# Patient Record
Sex: Male | Born: 1960 | Race: Black or African American | Hispanic: No | Marital: Single | Smoking: Former smoker
Health system: Southern US, Community
[De-identification: ages and names within clinical notes are randomized; demographics above are authoritative.]

## PROBLEM LIST (undated history)

## (undated) DIAGNOSIS — E782 Mixed hyperlipidemia: Secondary | ICD-10-CM

## (undated) DIAGNOSIS — B1920 Unspecified viral hepatitis C without hepatic coma: Secondary | ICD-10-CM

## (undated) DIAGNOSIS — G4730 Sleep apnea, unspecified: Secondary | ICD-10-CM

## (undated) DIAGNOSIS — F1010 Alcohol abuse, uncomplicated: Secondary | ICD-10-CM

## (undated) DIAGNOSIS — N183 Chronic kidney disease, stage 3 unspecified: Secondary | ICD-10-CM

## (undated) DIAGNOSIS — J40 Bronchitis, not specified as acute or chronic: Secondary | ICD-10-CM

## (undated) DIAGNOSIS — M25559 Pain in unspecified hip: Secondary | ICD-10-CM

## (undated) DIAGNOSIS — I10 Essential (primary) hypertension: Secondary | ICD-10-CM

## (undated) DIAGNOSIS — J449 Chronic obstructive pulmonary disease, unspecified: Secondary | ICD-10-CM

## (undated) DIAGNOSIS — M1990 Unspecified osteoarthritis, unspecified site: Secondary | ICD-10-CM

## (undated) DIAGNOSIS — F419 Anxiety disorder, unspecified: Secondary | ICD-10-CM

## (undated) DIAGNOSIS — N289 Disorder of kidney and ureter, unspecified: Secondary | ICD-10-CM

## (undated) DIAGNOSIS — G8929 Other chronic pain: Secondary | ICD-10-CM

## (undated) HISTORY — DX: Chronic kidney disease, stage 3 unspecified: N18.30

## (undated) HISTORY — PX: CATARACT EXTRACTION: SUR2

## (undated) HISTORY — PX: MOUTH SURGERY: SHX715

## (undated) HISTORY — DX: Unspecified viral hepatitis C without hepatic coma: B19.20

## (undated) HISTORY — DX: Essential (primary) hypertension: I10

## (undated) HISTORY — DX: Alcohol abuse, uncomplicated: F10.10

## (undated) HISTORY — DX: Mixed hyperlipidemia: E78.2

---

## 2013-07-06 ENCOUNTER — Emergency Department (HOSPITAL_COMMUNITY)
Admission: EM | Admit: 2013-07-06 | Discharge: 2013-07-06 | Disposition: A | Discharge status: Home / Self Care | Payer: BC Managed Care – PPO | Attending: Emergency Medicine | Admitting: Emergency Medicine

## 2013-07-06 ENCOUNTER — Other Ambulatory Visit: Payer: Self-pay

## 2013-07-06 ENCOUNTER — Encounter (HOSPITAL_COMMUNITY): Payer: Self-pay | Admitting: Emergency Medicine

## 2013-07-06 DIAGNOSIS — F17200 Nicotine dependence, unspecified, uncomplicated: Secondary | ICD-10-CM | POA: Insufficient documentation

## 2013-07-06 DIAGNOSIS — R300 Dysuria: Secondary | ICD-10-CM | POA: Insufficient documentation

## 2013-07-06 DIAGNOSIS — J029 Acute pharyngitis, unspecified: Secondary | ICD-10-CM | POA: Insufficient documentation

## 2013-07-06 DIAGNOSIS — R110 Nausea: Secondary | ICD-10-CM | POA: Insufficient documentation

## 2013-07-06 DIAGNOSIS — R42 Dizziness and giddiness: Secondary | ICD-10-CM | POA: Insufficient documentation

## 2013-07-06 DIAGNOSIS — Z8659 Personal history of other mental and behavioral disorders: Secondary | ICD-10-CM | POA: Insufficient documentation

## 2013-07-06 DIAGNOSIS — Z9119 Patient's noncompliance with other medical treatment and regimen: Secondary | ICD-10-CM | POA: Insufficient documentation

## 2013-07-06 DIAGNOSIS — I10 Essential (primary) hypertension: Secondary | ICD-10-CM

## 2013-07-06 DIAGNOSIS — R062 Wheezing: Secondary | ICD-10-CM | POA: Insufficient documentation

## 2013-07-06 DIAGNOSIS — Z91199 Patient's noncompliance with other medical treatment and regimen due to unspecified reason: Secondary | ICD-10-CM | POA: Insufficient documentation

## 2013-07-06 HISTORY — DX: Bronchitis, not specified as acute or chronic: J40

## 2013-07-06 HISTORY — DX: Essential (primary) hypertension: I10

## 2013-07-06 HISTORY — DX: Anxiety disorder, unspecified: F41.9

## 2013-07-06 LAB — CBC WITH DIFFERENTIAL/PLATELET
Basophils Relative: 0 % (ref 0–1)
Eosinophils Absolute: 0.4 10*3/uL (ref 0.0–0.7)
Eosinophils Relative: 7 % — ABNORMAL HIGH (ref 0–5)
MCH: 32.6 pg (ref 26.0–34.0)
MCHC: 35 g/dL (ref 30.0–36.0)
Neutrophils Relative %: 64 % (ref 43–77)
Platelets: 170 10*3/uL (ref 150–400)
RBC: 4.7 MIL/uL (ref 4.22–5.81)
RDW: 13.2 % (ref 11.5–15.5)
WBC: 6.4 10*3/uL (ref 4.0–10.5)

## 2013-07-06 LAB — URINALYSIS, ROUTINE W REFLEX MICROSCOPIC
Bilirubin Urine: NEGATIVE
Glucose, UA: NEGATIVE mg/dL
Ketones, ur: NEGATIVE mg/dL
Leukocytes, UA: NEGATIVE
Nitrite: NEGATIVE
Specific Gravity, Urine: >1.03 — ABNORMAL HIGH (ref 1.005–1.030)
pH: 5.5 (ref 5.0–8.0)

## 2013-07-06 LAB — COMPREHENSIVE METABOLIC PANEL
ALT: 40 U/L (ref 0–53)
AST: 47 U/L — ABNORMAL HIGH (ref 0–37)
Albumin: 3.5 g/dL (ref 3.5–5.2)
Alkaline Phosphatase: 106 U/L (ref 39–117)
Calcium: 9.9 mg/dL (ref 8.4–10.5)
Potassium: 3.3 mEq/L — ABNORMAL LOW (ref 3.5–5.1)
Sodium: 134 mEq/L — ABNORMAL LOW (ref 135–145)
Total Protein: 8.6 g/dL — ABNORMAL HIGH (ref 6.0–8.3)

## 2013-07-06 MED ORDER — CLONIDINE HCL 0.2 MG PO TABS
ORAL_TABLET | ORAL | Status: AC
  Administered 2013-07-06: 0.2 mg via ORAL
  Filled 2013-07-06: qty 1

## 2013-07-06 MED ORDER — LORAZEPAM 1 MG PO TABS
1.0000 mg | ORAL_TABLET | Freq: Once | ORAL | Status: AC
  Administered 2013-07-06: 1 mg via ORAL
  Filled 2013-07-06: qty 1

## 2013-07-06 MED ORDER — CLONIDINE HCL 0.2 MG PO TABS: 0.1000 mg | ORAL_TABLET | Freq: Two times a day (BID) | ORAL | Status: DC

## 2013-07-06 MED ORDER — CLONIDINE HCL 0.2 MG PO TABS: 0.2000 mg | ORAL_TABLET | Freq: Once | ORAL | Status: DC

## 2013-07-06 MED ORDER — CLONIDINE HCL 0.2 MG PO TABS
0.2000 mg | ORAL_TABLET | Freq: Once | ORAL | Status: AC
  Administered 2013-07-06: 0.2 mg via ORAL

## 2013-07-06 NOTE — ED Notes (Signed)
Sister states pts blood pressure was 214/138 last night and this morning it was 143/134. Pt states headaches and dizziness at times. States only feeling light headed at this time. States has had heart palpitations x 2 days. NAD at this time

## 2013-07-06 NOTE — ED Provider Notes (Signed)
CSN: [id number]     Arrival date & time 07/06/13  0906 History   First MD Initiated Contact with Patient 07/06/13 [phone]     Chief Complaint  Patient presents with  . Hypertension   (Consider location/radiation/quality/duration/timing/severity/associated sxs/prior Treatment) Patient is a 52 y.o. male presenting with hypertension. The history is provided by the patient and a relative.  Hypertension This is a chronic problem. Associated symptoms include nausea and a sore throat. Pertinent negatives include no abdominal pain, chest pain, chills, congestion, fever, headaches, numbness, rash, vomiting or weakness.   Mathew Lara is a 53 y.o. male who presents to the ED with hypertension. Family states that about a year ago his wife left him and he stopped all his BP medications and started drinking alcohol. Three days ago he started feeling dizzy and had a headache. Patient went to [facility] in [place] and they told him he had heart and kidney problems. They started him HCTZ 25 mg PO daily. His family went and picked him up last night and brought him to stay with them in [place]. They checked his blood pressure last night and it was 214/138 and this mornin 183/134.  On arrival to the ED BP 181/123. Patient denies chest pain, nausea, vomiting, dizziness or headache at this time. Last drank alcohol 3 days ago. Prior to that had been drinking daily.   Past Medical History  Diagnosis Date  . Hypertension   . Bronchitis   . Cardiomyopathy   . Anxiety    History reviewed. No pertinent past surgical history. No family history on file. History  Substance Use Topics  . Smoking status: Current Every Day Smoker    Types: Cigarettes  . Smokeless tobacco: Not on file  . Alcohol Use: Yes     Comment: daily, beer and liquor    Review of Systems  Constitutional: Negative for fever and chills. Appetite change: decreased.  HENT: Positive for sore throat. Negative for congestion.   Eyes:  Negative for visual disturbance.  Respiratory: Positive for wheezing. Negative for shortness of breath.   Cardiovascular: Negative for chest pain, palpitations and leg swelling.  Gastrointestinal: Positive for nausea. Negative for vomiting and abdominal pain.  Genitourinary: Positive for difficulty urinating. Negative for dysuria, urgency and frequency.  Musculoskeletal: Negative for gait problem.  Skin: Negative for rash.  Allergic/Immunologic: Negative for environmental allergies and food allergies.  Neurological: Negative for dizziness, syncope, weakness, numbness and headaches.  Psychiatric/Behavioral: Negative for confusion. The patient is not nervous/anxious.     Allergies  Review of patient's allergies indicates no known allergies.  BP 182/119  Pulse 84  Temp(Src) 98 F (36.7 C) (Oral)  Resp 18  SpO2 95%  Home Medications  Physical Exam  Nursing note and vitals reviewed. Constitutional: He is oriented to person, place, and time. He appears well-developed and well-nourished. No distress.  HENT:  Head: Normocephalic and atraumatic.  Right Ear: External ear normal.  Left Ear: External ear normal.  Mouth/Throat: Uvula is midline, oropharynx is clear and moist and mucous membranes are normal.  Eyes: Conjunctivae and EOM are normal. Pupils are equal, round, and reactive to light.  Neck: Trachea normal and normal range of motion. Neck supple. Normal carotid pulses and no JVD present. Carotid bruit is not present.  Cardiovascular: Normal rate and regular rhythm.   Pulmonary/Chest: Effort normal and breath sounds normal.  Abdominal: Soft. Bowel sounds are normal. There is no tenderness.  Musculoskeletal: Normal range of motion. He exhibits no edema and no  tenderness.  Neurological: He is alert and oriented to person, place, and time. He has normal strength and normal reflexes. No cranial nerve deficit or sensory deficit. He displays a negative Romberg sign. Coordination and gait  normal.  Skin: Skin is warm and dry.  Psychiatric: He has a normal mood and affect. His behavior is normal.   Results for orders placed during the hospital encounter of 07/06/13 (from the past 24 hour(s))  URINALYSIS, ROUTINE W REFLEX MICROSCOPIC     Status: Abnormal   Collection Time    07/06/13 10:24 AM      Result Value Range   Color, Urine YELLOW  YELLOW   APPearance CLEAR  CLEAR   Specific Gravity, Urine >1.030 (*) 1.005 - 1.030   pH 5.5  5.0 - 8.0   Glucose, UA NEGATIVE  NEGATIVE mg/dL   Hgb urine dipstick NEGATIVE  NEGATIVE   Bilirubin Urine NEGATIVE  NEGATIVE   Ketones, ur NEGATIVE  NEGATIVE mg/dL   Protein, ur 100 (*) NEGATIVE mg/dL   Urobilinogen, UA 0.2  0.0 - 1.0 mg/dL   Nitrite NEGATIVE  NEGATIVE   Leukocytes, UA NEGATIVE  NEGATIVE  URINE MICROSCOPIC-ADD ON     Status: None   Collection Time    07/06/13 10:24 AM      Result Value Range   RBC / HPF 0-2  <3 RBC/hpf  CBC WITH DIFFERENTIAL     Status: Abnormal   Collection Time    07/06/13 10:47 AM      Result Value Range   WBC 6.4  4.0 - 10.5 K/uL   RBC 4.70  4.22 - 5.81 MIL/uL   Hemoglobin 15.3  13.0 - 17.0 g/dL   HCT 43.7  39.0 - 52.0 %   MCV 93.0  78.0 - 100.0 fL   MCH 32.6  26.0 - 34.0 pg   MCHC 35.0  30.0 - 36.0 g/dL   RDW 13.2  11.5 - 15.5 %   Platelets 170  150 - 400 K/uL   Neutrophils Relative % 64  43 - 77 %   Neutro Abs 4.1  1.7 - 7.7 K/uL   Lymphocytes Relative 22  12 - 46 %   Lymphs Abs 1.4  0.7 - 4.0 K/uL   Monocytes Relative 7  3 - 12 %   Monocytes Absolute 0.4  0.1 - 1.0 K/uL   Eosinophils Relative 7 (*) 0 - 5 %   Eosinophils Absolute 0.4  0.0 - 0.7 K/uL   Basophils Relative 0  0 - 1 %   Basophils Absolute 0.0  0.0 - 0.1 K/uL  COMPREHENSIVE METABOLIC PANEL     Status: Abnormal   Collection Time    07/06/13 10:47 AM      Result Value Range   Sodium 134 (*) 135 - 145 mEq/L   Potassium 3.3 (*) 3.5 - 5.1 mEq/L   Chloride 95 (*) 96 - 112 mEq/L   CO2 27  19 - 32 mEq/L   Glucose, Bld 94  70 -  99 mg/dL   BUN 21  6 - 23 mg/dL   Creatinine, Ser 1.54 (*) 0.50 - 1.35 mg/dL   Calcium 9.9  8.4 - 10.5 mg/dL   Total Protein 8.6 (*) 6.0 - 8.3 g/dL   Albumin 3.5  3.5 - 5.2 g/dL   AST 47 (*) 0 - 37 U/L   ALT 40  0 - 53 U/L   Alkaline Phosphatase 106  39 - 117 U/L   Total Bilirubin 1.1  0.3 - 1.2 mg/dL   GFR calc non Af Amer 50 (*) >90 mL/min   GFR calc Af Amer 58 (*) >90 mL/min    ED Course: Dr. Jeanell Sparrow reviewed EKG from today and compared to EKG done in [place].   Procedures  Dr. Jeanell Sparrow in to evaluate the patient and feels he is stable for discharge home with Clonidine and follow up as scheduled with PCP.  MDM  Patient's daughter here and request patient be admitted for observation. Will request records from ED visit in [place] and compare EKG and make decision at that time.  Dr. Jeanell Sparrow in to discuss plan of care with the family in detail. Patient will go home and follow up with PCP as planned. Family with bring him back for any problems.   52 y.o. male with hypertension. He has stopped drinking alcohol 3 days ago. He will start Clonidine and follow up with PCP. Discussed with the patient and all questioned fully answered.    Medication List    TAKE these medications       cloNIDine 0.2 MG tablet  Commonly known as:  CATAPRES  Take 0.5 tablets (0.1 mg total) by mouth 2 (two) times daily.      ASK your doctor about these medications       hydrochlorothiazide 12.5 MG capsule  Commonly known as:  MICROZIDE  Take 25 mg by mouth daily.          Ashley Murrain, [place] 07/06/13 [phone]

## 2013-07-06 NOTE — ED Notes (Signed)
Pt has been out of medications x 1 1/2 years. He was recently hospitalized and was put on hydrochlorothiazide.

## 2013-07-06 NOTE — ED Provider Notes (Signed)
  I performed a history and physical examination of Mathew Lara and discussed his management with Mathew Lara.  I agree with the history, physical, assessment, and plan of care, with the following exceptions: None  I was present for the following procedures: None Time Spent in Critical Care of the patient: None Time spent in discussions with the patient and family: [age] y.o. Male with hypertension and general malaise.  No acute sequelae noted. Patient with ongoing renal insufficiency, and lvh without change from prior ekg, no neuro changes.  Patient started on clonidine in additon to hctz recently restarted.  He has been off his meds for 1.5 years secondary to noncompliance.  I have discussed his care extensively with patient, daughter and other family members.  Mathew Evener, MD 07/06/13 [phone]

## 2013-07-06 NOTE — ED Notes (Signed)
Went in to d/c pt. Pt family requesting to speak with PA. PA at bedside. PA reported would speak with EDP and get EDP to speak with pt.

## 2013-07-06 NOTE — ED Notes (Signed)
Patient would like something to eat and drink. PA made aware.Patint made aware that PA would like him to wait a little bit longer.

## 2013-07-06 NOTE — ED Notes (Signed)
Family member states she does not feel comfortable taking pt home due to his blood pressure being elevated. Dr Jeanell Sparrow in to [facility]

## 2013-07-06 NOTE — ED Notes (Signed)
Pt states his wife left him over a year ago. States he lost his job and has been drink a lot. States his bp is up because he is stressed.

## 2013-08-09 ENCOUNTER — Other Ambulatory Visit (HOSPITAL_COMMUNITY): Payer: Self-pay | Admitting: Family Medicine

## 2013-08-09 DIAGNOSIS — I10 Essential (primary) hypertension: Secondary | ICD-10-CM

## 2013-08-14 ENCOUNTER — Ambulatory Visit (HOSPITAL_COMMUNITY)
Admission: RE | Admit: 2013-08-14 | Discharge: 2013-08-14 | Disposition: A | Discharge status: Home / Self Care | Payer: BC Managed Care – PPO | Source: Ambulatory Visit | Attending: Family Medicine | Admitting: Family Medicine

## 2013-08-14 DIAGNOSIS — I10 Essential (primary) hypertension: Secondary | ICD-10-CM

## 2013-08-28 ENCOUNTER — Other Ambulatory Visit (HOSPITAL_COMMUNITY): Payer: Self-pay | Admitting: Family Medicine

## 2013-08-28 DIAGNOSIS — I701 Atherosclerosis of renal artery: Secondary | ICD-10-CM

## 2013-08-28 DIAGNOSIS — I10 Essential (primary) hypertension: Secondary | ICD-10-CM

## 2013-09-10 ENCOUNTER — Other Ambulatory Visit (HOSPITAL_COMMUNITY): Payer: Self-pay

## 2013-09-10 ENCOUNTER — Ambulatory Visit (HOSPITAL_COMMUNITY)
Admission: RE | Admit: 2013-09-10 | Discharge: 2013-09-10 | Disposition: A | Discharge status: Home / Self Care | Payer: BC Managed Care – PPO | Source: Ambulatory Visit | Attending: Emergency Medicine | Admitting: Emergency Medicine

## 2013-09-10 DIAGNOSIS — I701 Atherosclerosis of renal artery: Secondary | ICD-10-CM

## 2013-09-10 DIAGNOSIS — I10 Essential (primary) hypertension: Secondary | ICD-10-CM

## 2013-09-10 NOTE — Progress Notes (Signed)
*  PRELIMINARY RESULTS* Vascular Ultrasound Renal Artery Duplex has been completed.  Preliminary findings:  No Evidence of Renal Artery stenosis bilaterally. Symmetrical kidney size. Normal intrarenal indices.   Landry Mellow, RDMS, RVT  09/10/2013, 9:34 AM

## 2013-09-24 ENCOUNTER — Emergency Department (HOSPITAL_COMMUNITY): Admission: EM | Admit: 2013-09-24 | Discharge: 2013-09-24 | Discharge status: Against Medical Advice | Payer: Self-pay

## 2013-09-24 ENCOUNTER — Emergency Department (HOSPITAL_COMMUNITY)
Admission: EM | Admit: 2013-09-24 | Discharge: 2013-09-24 | Disposition: A | Discharge status: Home / Self Care | Payer: BC Managed Care – PPO | Attending: Emergency Medicine | Admitting: Emergency Medicine

## 2013-09-24 ENCOUNTER — Encounter (HOSPITAL_COMMUNITY): Payer: Self-pay | Admitting: Emergency Medicine

## 2013-09-24 ENCOUNTER — Emergency Department (HOSPITAL_COMMUNITY): Payer: BC Managed Care – PPO

## 2013-09-24 DIAGNOSIS — F17200 Nicotine dependence, unspecified, uncomplicated: Secondary | ICD-10-CM | POA: Insufficient documentation

## 2013-09-24 DIAGNOSIS — Z79899 Other long term (current) drug therapy: Secondary | ICD-10-CM | POA: Insufficient documentation

## 2013-09-24 DIAGNOSIS — F411 Generalized anxiety disorder: Secondary | ICD-10-CM | POA: Insufficient documentation

## 2013-09-24 DIAGNOSIS — I10 Essential (primary) hypertension: Secondary | ICD-10-CM | POA: Insufficient documentation

## 2013-09-24 DIAGNOSIS — Z8709 Personal history of other diseases of the respiratory system: Secondary | ICD-10-CM | POA: Insufficient documentation

## 2013-09-24 DIAGNOSIS — I959 Hypotension, unspecified: Secondary | ICD-10-CM | POA: Insufficient documentation

## 2013-09-24 DIAGNOSIS — Z7982 Long term (current) use of aspirin: Secondary | ICD-10-CM | POA: Insufficient documentation

## 2013-09-24 LAB — CBC WITH DIFFERENTIAL/PLATELET
Basophils Absolute: 0 10*3/uL (ref 0.0–0.1)
Basophils Relative: 0 % (ref 0–1)
EOS ABS: 0.2 10*3/uL (ref 0.0–0.7)
Eosinophils Relative: 3 % (ref 0–5)
HEMATOCRIT: 44.3 % (ref 39.0–52.0)
HEMOGLOBIN: 14.9 g/dL (ref 13.0–17.0)
Lymphocytes Relative: 20 % (ref 12–46)
Lymphs Abs: 1.5 10*3/uL (ref 0.7–4.0)
MCH: 31 pg (ref 26.0–34.0)
MCHC: 33.6 g/dL (ref 30.0–36.0)
MCV: 92.3 fL (ref 78.0–100.0)
MONO ABS: 0.6 10*3/uL (ref 0.1–1.0)
Monocytes Relative: 7 % (ref 3–12)
NEUTROS ABS: 5.4 10*3/uL (ref 1.7–7.7)
Neutrophils Relative %: 70 % (ref 43–77)
Platelets: 217 10*3/uL (ref 150–400)
RBC: 4.8 MIL/uL (ref 4.22–5.81)
RDW: 13.1 % (ref 11.5–15.5)
WBC: 7.8 10*3/uL (ref 4.0–10.5)

## 2013-09-24 LAB — BASIC METABOLIC PANEL
BUN: 27 mg/dL — AB (ref 6–23)
CHLORIDE: 99 meq/L (ref 96–112)
CO2: 27 mEq/L (ref 19–32)
CREATININE: 2.27 mg/dL — AB (ref 0.50–1.35)
Calcium: 9.9 mg/dL (ref 8.4–10.5)
GFR calc non Af Amer: 31 mL/min — ABNORMAL LOW (ref >90)
GFR, EST AFRICAN AMERICAN: 36 mL/min — AB (ref >90)
Glucose, Bld: 117 mg/dL — ABNORMAL HIGH (ref 70–99)
Potassium: 4.6 mEq/L (ref 3.7–5.3)
Sodium: 139 mEq/L (ref 137–147)

## 2013-09-24 LAB — TROPONIN I: Troponin I: <0.3 ng/mL (ref <0.30)

## 2013-09-24 LAB — LACTIC ACID, PLASMA: Lactic Acid, Venous: 1.2 mmol/L (ref 0.5–2.2)

## 2013-09-24 MED ORDER — SODIUM CHLORIDE 0.9 % IV BOLUS (SEPSIS)
1000.0000 mL | Freq: Once | INTRAVENOUS | Status: AC
  Administered 2013-09-24: 1000 mL via INTRAVENOUS

## 2013-09-24 NOTE — ED Provider Notes (Signed)
CSN: [id number]     Arrival date & time 09/24/13  1742 History   First MD Initiated Contact with Patient 09/24/13 1815 This chart was scribed for Mathew Diego, MD by Anastasia Pall, ED Scribe. This patient was seen in room APA17/APA17 and the patient's care was started at 6:18 PM.      Chief Complaint  Patient presents with  . Dizziness  . Hypertension    Patient is a 53 y.o. male presenting with dizziness and hypertension. The history is provided by the patient. No language interpreter was used.  Dizziness Quality:  Lightheadedness Duration:  5 hours (since 1:30pm today) Timing:  Constant Progression:  Unchanged Chronicity:  New Associated symptoms: shortness of breath and weakness   Associated symptoms: no chest pain, no diarrhea and no headaches   Hypertension Associated symptoms include shortness of breath. Pertinent negatives include no chest pain, no abdominal pain and no headaches.   HPI Comments: [name] is a 53 y.o. male with h/o HTN, who presents to the Emergency Department complaining of constant weakness, with associated intermittent dizziness and SOB, onset 1:30pm today. He states he thinks he may have taken an extra BP pill. He states his daughter usually gives him his medicine, but this time he tried to do it. He denies any pain, and any other associated symptoms.   PCP - Purvis Kilts, MD  Past Medical History  Diagnosis Date  . Hypertension   . Bronchitis   . Cardiomyopathy   . Anxiety    History reviewed. No pertinent past surgical history. No family history on file. History  Substance Use Topics  . Smoking status: Current Every Day Smoker    Types: Cigarettes  . Smokeless tobacco: Not on file  . Alcohol Use: Yes     Comment: daily, beer and liquor    Review of Systems  Constitutional: Negative for appetite change and fatigue.  HENT: Negative for congestion, ear discharge and sinus pressure.   Eyes: Negative for discharge.   Respiratory: Positive for shortness of breath. Negative for cough.   Cardiovascular: Negative for chest pain.  Gastrointestinal: Negative for abdominal pain and diarrhea.  Genitourinary: Negative for frequency and hematuria.  Musculoskeletal: Negative for back pain.  Skin: Negative for rash.  Neurological: Positive for dizziness and weakness. Negative for seizures and headaches.  Psychiatric/Behavioral: Negative for hallucinations.    Allergies  Review of patient's allergies indicates no known allergies.  Home Medications   Current Outpatient Rx  Name  Route  Sig  Dispense  Refill  . amLODipine (NORVASC) 5 MG tablet   Oral   Take 5 mg by mouth daily.         Marland Kitchen aspirin EC 81 MG tablet   Oral   Take 81 mg by mouth daily.         . cloNIDine (CATAPRES) 0.2 MG tablet   Oral   Take 0.5 tablets (0.1 mg total) by mouth 2 (two) times daily.   20 tablet   0   . losartan (COZAAR) 50 MG tablet   Oral   Take 50 mg by mouth daily.         . Multiple Vitamin (MULTIVITAMIN WITH MINERALS) TABS tablet   Oral   Take 1 tablet by mouth daily.         . pseudoephedrine-acetaminophen (TYLENOL SINUS) 30-500 MG TABS   Oral   Take 2 tablets by mouth every 4 (four) hours as needed (sinus).  BP 95/72  Pulse 86  Temp(Src) 97.6 F (36.4 C) (Oral)  Resp 20  Ht 5\' 8"  (1.727 m)  Wt 165 lb (74.844 kg)  BMI 25.09 kg/m2  SpO2 96%  Physical Exam  Constitutional: He is oriented to person, place, and time. He appears well-developed.  HENT:  Head: Normocephalic.  Eyes: Conjunctivae and EOM are normal. No scleral icterus.  Neck: Neck supple. No thyromegaly present.  Cardiovascular: Normal rate and regular rhythm.  Exam reveals no gallop and no friction rub.   No murmur heard. Pulmonary/Chest: No stridor. He has no wheezes. He has no rales. He exhibits no tenderness.  Abdominal: He exhibits no distension. There is no tenderness. There is no rebound.  Musculoskeletal: Normal  range of motion. He exhibits no edema.  Lymphadenopathy:    He has no cervical adenopathy.  Neurological: He is oriented to person, place, and time. He exhibits normal muscle tone. Coordination normal.  Skin: No rash noted. No erythema.  Psychiatric: He has a normal mood and affect. His behavior is normal.    ED Course  Procedures (including critical care time)  DIAGNOSTIC STUDIES: Oxygen Saturation is 96% on room air, normal by my interpretation.    COORDINATION OF CARE: 6:21 PM-Discussed treatment plan which includes IV fluids with pt at bedside and pt agreed to plan.   Results for orders placed during the hospital encounter of 09/24/13  CBC WITH DIFFERENTIAL      Result Value Range   WBC 7.8  4.0 - 10.5 K/uL   RBC 4.80  4.22 - 5.81 MIL/uL   Hemoglobin 14.9  13.0 - 17.0 g/dL   HCT 44.3  39.0 - 52.0 %   MCV 92.3  78.0 - 100.0 fL   MCH 31.0  26.0 - 34.0 pg   MCHC 33.6  30.0 - 36.0 g/dL   RDW 13.1  11.5 - 15.5 %   Platelets 217  150 - 400 K/uL   Neutrophils Relative % 70  43 - 77 %   Neutro Abs 5.4  1.7 - 7.7 K/uL   Lymphocytes Relative 20  12 - 46 %   Lymphs Abs 1.5  0.7 - 4.0 K/uL   Monocytes Relative 7  3 - 12 %   Monocytes Absolute 0.6  0.1 - 1.0 K/uL   Eosinophils Relative 3  0 - 5 %   Eosinophils Absolute 0.2  0.0 - 0.7 K/uL   Basophils Relative 0  0 - 1 %   Basophils Absolute 0.0  0.0 - 0.1 K/uL  BASIC METABOLIC PANEL      Result Value Range   Sodium 139  137 - 147 mEq/L   Potassium 4.6  3.7 - 5.3 mEq/L   Chloride 99  96 - 112 mEq/L   CO2 27  19 - 32 mEq/L   Glucose, Bld 117 (*) 70 - 99 mg/dL   BUN 27 (*) 6 - 23 mg/dL   Creatinine, Ser 2.27 (*) 0.50 - 1.35 mg/dL   Calcium 9.9  8.4 - 10.5 mg/dL   GFR calc non Af Amer 31 (*) >90 mL/min   GFR calc Af Amer 36 (*) >90 mL/min  TROPONIN I      Result Value Range   Troponin I <0.30  <0.30 ng/mL  LACTIC ACID, PLASMA      Result Value Range   Lactic Acid, Venous 1.2  0.5 - 2.2 mmol/L   Dg Chest 2  View  09/24/2013   CLINICAL DATA:  Dizziness and hypertension.  History of hypertension and cardiomyopathy. Smoker.  EXAM: CHEST  2 VIEW  COMPARISON:  None.  FINDINGS: Mild hyperinflation. Midline trachea. Heart size upper normal, with tortuous descending thoracic aorta. No pleural effusion or pneumothorax. Lucency projecting about the right upper lobe laterally measures 2.4 cm and is favored to represent a dominant bulla or bleb.  IMPRESSION: No acute cardiopulmonary disease.  Subtle right upper lobe lucency, favored to represent a dominant bulla or bleb. Comparison with prior radiographs versus radiographic followup at 2-3 months suggested.   Electronically Signed   By: Abigail Miyamoto M.D.   On: 09/24/2013 18:31    EKG Interpretation   None      Medications  sodium chloride 0.9 % bolus 1,000 mL (1,000 mLs Intravenous New Bag/Given 09/24/13 1833)    Date: 09/24/2013  Rate: 78  Rhythm: normal sinus rhythm  QRS Axis: normal  Intervals: normal  ST/T Wave abnormalities: nonspecific ST changes  Conduction Disutrbances:none  Narrative Interpretation:   Old EKG Reviewed: changes noted   MDM  Pt had taken too much bp med today.  Pt responded to fluids.   No chest pain.  Inverted t waves new.  Will follow up with pcp and cards   Mathew Diego, MD 09/24/13 2017

## 2013-09-24 NOTE — Discharge Instructions (Signed)
Do not take any blood pressure medicine tonight.  Follow up with your md this week.  Follow up with [facility] cardiology in a week.

## 2013-09-24 NOTE — ED Notes (Signed)
Pt reports dizziness and lightheaded, sob at times, since 1:30pm today,  Thinks his blood pressure may be elevated. But has not checked it. Denies any nausea or vomiting, no fever or cough. No cp.

## 2013-10-17 ENCOUNTER — Emergency Department (HOSPITAL_COMMUNITY): Payer: Worker's Compensation

## 2013-10-17 ENCOUNTER — Encounter (HOSPITAL_COMMUNITY): Payer: Self-pay | Admitting: Emergency Medicine

## 2013-10-17 ENCOUNTER — Emergency Department (HOSPITAL_COMMUNITY)
Admission: EM | Admit: 2013-10-17 | Discharge: 2013-10-17 | Disposition: A | Discharge status: Home / Self Care | Payer: Worker's Compensation | Attending: Emergency Medicine | Admitting: Emergency Medicine

## 2013-10-17 DIAGNOSIS — Y990 Civilian activity done for income or pay: Secondary | ICD-10-CM | POA: Insufficient documentation

## 2013-10-17 DIAGNOSIS — Y9289 Other specified places as the place of occurrence of the external cause: Secondary | ICD-10-CM | POA: Diagnosis not present

## 2013-10-17 DIAGNOSIS — X500XXA Overexertion from strenuous movement or load, initial encounter: Secondary | ICD-10-CM | POA: Diagnosis not present

## 2013-10-17 DIAGNOSIS — Z791 Long term (current) use of non-steroidal anti-inflammatories (NSAID): Secondary | ICD-10-CM | POA: Diagnosis not present

## 2013-10-17 DIAGNOSIS — M19019 Primary osteoarthritis, unspecified shoulder: Secondary | ICD-10-CM | POA: Insufficient documentation

## 2013-10-17 DIAGNOSIS — Z8639 Personal history of other endocrine, nutritional and metabolic disease: Secondary | ICD-10-CM | POA: Insufficient documentation

## 2013-10-17 DIAGNOSIS — Z862 Personal history of diseases of the blood and blood-forming organs and certain disorders involving the immune mechanism: Secondary | ICD-10-CM | POA: Insufficient documentation

## 2013-10-17 DIAGNOSIS — S0993XA Unspecified injury of face, initial encounter: Secondary | ICD-10-CM | POA: Insufficient documentation

## 2013-10-17 DIAGNOSIS — I2510 Atherosclerotic heart disease of native coronary artery without angina pectoris: Secondary | ICD-10-CM | POA: Insufficient documentation

## 2013-10-17 DIAGNOSIS — M19011 Primary osteoarthritis, right shoulder: Secondary | ICD-10-CM

## 2013-10-17 DIAGNOSIS — Y9389 Activity, other specified: Secondary | ICD-10-CM | POA: Diagnosis not present

## 2013-10-17 DIAGNOSIS — IMO0002 Reserved for concepts with insufficient information to code with codable children: Secondary | ICD-10-CM | POA: Insufficient documentation

## 2013-10-17 DIAGNOSIS — I10 Essential (primary) hypertension: Secondary | ICD-10-CM | POA: Diagnosis not present

## 2013-10-17 DIAGNOSIS — S46911A Strain of unspecified muscle, fascia and tendon at shoulder and upper arm level, right arm, initial encounter: Secondary | ICD-10-CM

## 2013-10-17 DIAGNOSIS — Z8709 Personal history of other diseases of the respiratory system: Secondary | ICD-10-CM | POA: Diagnosis not present

## 2013-10-17 DIAGNOSIS — Z8659 Personal history of other mental and behavioral disorders: Secondary | ICD-10-CM | POA: Diagnosis not present

## 2013-10-17 DIAGNOSIS — F17200 Nicotine dependence, unspecified, uncomplicated: Secondary | ICD-10-CM | POA: Diagnosis not present

## 2013-10-17 DIAGNOSIS — S46909A Unspecified injury of unspecified muscle, fascia and tendon at shoulder and upper arm level, unspecified arm, initial encounter: Secondary | ICD-10-CM | POA: Diagnosis present

## 2013-10-17 DIAGNOSIS — S199XXA Unspecified injury of neck, initial encounter: Secondary | ICD-10-CM

## 2013-10-17 DIAGNOSIS — Z87448 Personal history of other diseases of urinary system: Secondary | ICD-10-CM | POA: Insufficient documentation

## 2013-10-17 DIAGNOSIS — S4980XA Other specified injuries of shoulder and upper arm, unspecified arm, initial encounter: Secondary | ICD-10-CM | POA: Diagnosis present

## 2013-10-17 HISTORY — DX: Disorder of kidney and ureter, unspecified: N28.9

## 2013-10-17 MED ORDER — CYCLOBENZAPRINE HCL 5 MG PO TABS: 5.0000 mg | ORAL_TABLET | Freq: Three times a day (TID) | ORAL | Status: DC | PRN

## 2013-10-17 MED ORDER — NAPROXEN 500 MG PO TABS: 500.0000 mg | ORAL_TABLET | Freq: Two times a day (BID) | ORAL | Status: DC

## 2013-10-17 MED ORDER — NAPROXEN 250 MG PO TABS
500.0000 mg | ORAL_TABLET | Freq: Once | ORAL | Status: AC
  Administered 2013-10-17: 500 mg via ORAL
  Filled 2013-10-17: qty 2

## 2013-10-17 MED ORDER — CYCLOBENZAPRINE HCL 10 MG PO TABS
5.0000 mg | ORAL_TABLET | Freq: Once | ORAL | Status: AC
  Administered 2013-10-17: 5 mg via ORAL
  Filled 2013-10-17: qty 1

## 2013-10-17 NOTE — ED Notes (Signed)
Pt c/o shoulder pain radiating down bag. 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

## 2013-10-17 NOTE — ED Provider Notes (Signed)
CSN: [id number]     Arrival date & time 10/17/13  1847 History   First MD Initiated Contact with Patient 10/17/13 2024     Chief Complaint  Patient presents with  . Shoulder Injury     (Consider location/radiation/quality/duration/timing/severity/associated sxs/prior Treatment) HPI Comments: Mathew Lara is a 53 y.o. 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  He reports hearing a "cracking" sensation in the right shoulder area tonight with sudden onset of worse pain.  The pain radiates into his back and across the shoulder blade and along his right lateral neck. He denies numbness or weakness in his upper extremities.  He has had no medicines prior to arrival for this condition.     The history is provided by the patient.    Past Medical History  Diagnosis Date  . Hypertension   . Bronchitis   . Coronary artery disease   . Renal disorder   . High cholesterol    History reviewed. No pertinent past surgical history. No family history on file. History  Substance Use Topics  . Smoking status: Current Every Day Smoker    Types: Cigarettes  . Smokeless tobacco: Not on file  . Alcohol Use: Yes     Comment: occasional    Review of Systems  Constitutional: Negative for fever.  Musculoskeletal: Positive for arthralgias and neck pain. Negative for joint swelling, myalgias and neck stiffness.  Neurological: Negative for weakness and numbness.      Allergies  Review of patient's allergies indicates no known allergies.  Home Medications   Current Outpatient Rx  Name  Route  Sig  Dispense  Refill  . cyclobenzaprine (FLEXERIL) 5 MG tablet   Oral   Take 1 tablet (5 mg total) by mouth 3 (three) times daily as needed for muscle  spasms.   15 tablet   0   . naproxen (NAPROSYN) 500 MG tablet   Oral   Take 1 tablet (500 mg total) by mouth 2 (two) times daily.   20 tablet   0    BP 135/84  Pulse 104  Temp(Src) 97.2 F (36.2 C) (Oral)  Resp 20  Ht 5\' 8"  (1.727 m)  Wt 233 lb (105.688 kg)  BMI 35.44 kg/m2  SpO2 99% Physical Exam  Constitutional: He appears well-developed and well-nourished.  HENT:  Head: Atraumatic.  Neck: Normal range of motion.  Cardiovascular:  Pulses equal bilaterally  Musculoskeletal: He exhibits tenderness.  ttp across right superior shoulder,  At ac joint and across scapula.   Muscle spasm appreciated trapezius right.  Neurological: He is alert. He has normal strength. He displays normal reflexes. No sensory deficit.  Equal strength  Skin: Skin is warm and dry.  Psychiatric: He has a normal mood and affect.    ED Course  Procedures (including critical care time) Labs Review Labs Reviewed - No data to display Imaging Review Dg Cervical Spine Complete  10/17/2013   CLINICAL DATA:  Status post fall with pain  EXAM: CERVICAL SPINE  4+ VIEWS  COMPARISON:  None.  FINDINGS: There is no evidence of cervical spine fracture or prevertebral soft tissue swelling. Alignment is normal. Minimal anterior osteophytosis is identified at C4, C5.  IMPRESSION: No acute fracture or dislocation.   Electronically  Signed   By: Abelardo Diesel M.D.   On: 10/17/2013 21:50   Dg Shoulder Right  10/17/2013   CLINICAL DATA:  Right shoulder pain.  Felt a pop in the shoulder.  EXAM: RIGHT SHOULDER - 2+ VIEW  COMPARISON:  None.  FINDINGS: Mild AC joint osteoarthritis is present. Glenohumeral joint appears located. There is no fracture. Visualize right chest appears within normal limits.  IMPRESSION: No acute osseous abnormality.  Mild right AC joint osteoarthritis.   Electronically Signed   By: Dereck Ligas M.D.   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 xrays c/w Ac spurring, patient with increased risk for rotator injuries.  Prescribed flexeril due to palpable muscle spasm.  Naproxen,  Ice tx x 2 days,  Add heat on day 3.  F/u with pcp if not improved over the next week . Pt given light duty note for this week.  The patient appears reasonably screened and/or stabilized for discharge and I doubt any other medical condition or other [facility] requiring further screening, evaluation, or treatment in the ED at this time prior to discharge.     Evalee Jefferson, PA-C 10/18/13 [phone]

## 2013-10-17 NOTE — Discharge Instructions (Signed)
Arthritis, Nonspecific Arthritis is pain, redness, warmth, or puffiness (inflammation) of a joint. The joint may be stiff or hurt when you move it. One or more joints may be affected. There are many types of arthritis. Your doctor may not know what type you have right away. The most common cause of arthritis is wear and tear on the joint (osteoarthritis). HOME CARE   Only take medicine as told by your doctor.  Rest the joint as much as possible.  Raise (elevate) your joint if it is puffy.  Use crutches if the painful joint is in your leg.  Drink enough fluids to keep your pee (urine) clear or pale yellow.  Follow your doctor's diet instructions.  Use cold packs for very bad joint pain for 10 to 15 minutes every hour. Ask your doctor if it is okay for you to use hot packs.  Exercise as told by your doctor.  Take a warm shower if you have stiffness in the morning.  Move your sore joints throughout the day. GET HELP RIGHT AWAY IF:   You have a fever.  You have very bad joint pain, puffiness, or redness.  You have many joints that are painful and puffy.  You are not getting better with treatment.  You have very bad back pain or leg weakness.  You cannot control when you poop (bowel movement) or pee (urinate).  You do not feel better in 24 hours or are getting worse.  You are having side effects from your medicine. MAKE SURE YOU:   Understand these instructions.  Will watch your condition.  Will get help right away if you are not doing well or get worse. Document Released: 11/17/2009 Document Revised: 02/22/2012 Document Reviewed: 11/17/2009 [facility] Patient Information 2014 [place], [place].  Muscle Strain A muscle strain is an injury that occurs when a muscle is stretched beyond its normal length. Usually a small number of muscle fibers are torn when this happens. Muscle strain is rated in degrees. First-degree strains have the least amount of muscle fiber tearing and  pain. Second-degree and third-degree strains have increasingly more tearing and pain.  Usually, recovery from muscle strain takes 1 2 weeks. Complete healing takes 5 6 weeks.  CAUSES  Muscle strain happens when a sudden, violent force placed on a muscle stretches it too far. This may occur with lifting, sports, or a fall.  RISK FACTORS Muscle strain is especially common in athletes.  SIGNS AND SYMPTOMS At the site of the muscle strain, there may be:  Pain.  Bruising.  Swelling.  Difficulty using the muscle due to pain or lack of normal function. DIAGNOSIS  Your health care provider will perform a physical exam and ask about your medical history. TREATMENT  Often, the best treatment for a muscle strain is resting, icing, and applying cold compresses to the injured area.  HOME CARE INSTRUCTIONS   Use the PRICE method of treatment to promote muscle healing during the first 2 3 days after your injury. The PRICE method involves:  Protecting the muscle from being injured again.  Restricting your activity and resting the injured body part.  Icing your injury. To do this, put ice in a plastic bag. Place a towel between your skin and the bag. Then, apply the ice and leave it on from 15 20 minutes each hour. After the third day, switch to moist heat packs.  Apply compression to the injured area with a splint or elastic bandage. Be careful not to wrap it  too tightly. This may interfere with blood circulation or increase swelling.  Elevate the injured body part above the level of your heart as often as you can.  Only take over-the-counter or prescription medicines for pain, discomfort, or fever as directed by your health care provider.  Warming up prior to exercise helps to prevent future muscle strains. SEEK MEDICAL CARE IF:   You have increasing pain or swelling in the injured area.  You have numbness, tingling, or a significant loss of strength in the injured area. MAKE SURE YOU:     Understand these instructions.  Will watch your condition.  Will get help right away if you are not doing well or get worse. Document Released: 08/23/2005 Document Revised: 06/13/2013 Document Reviewed: 03/22/2013 [facility] Patient Information 2014 [place], [place].   Use the medicines prescribed for pain.  Apply an ice pack as much as possible the next 2 days,  Then add a heating pad to your shoulder for 20 minutes several times daily starting on day three.  Get rechecked by your doctor if not better over the next week.

## 2013-10-18 ENCOUNTER — Encounter (HOSPITAL_COMMUNITY): Payer: Self-pay | Admitting: Emergency Medicine

## 2013-10-18 NOTE — ED Provider Notes (Signed)
Medical screening examination/treatment/procedure(s) were performed by non-physician practitioner and as supervising physician I was immediately available for consultation/collaboration.  EKG Interpretation   None        Jasper Riling. Alvino Chapel, MD 10/18/13 [phone]

## 2013-10-23 ENCOUNTER — Ambulatory Visit: Payer: BC Managed Care – PPO | Admitting: Cardiovascular Disease

## 2013-11-07 ENCOUNTER — Encounter: Payer: Self-pay | Admitting: Cardiology

## 2013-11-07 ENCOUNTER — Encounter: Payer: Self-pay | Admitting: *Deleted

## 2013-11-07 DIAGNOSIS — I10 Essential (primary) hypertension: Secondary | ICD-10-CM | POA: Insufficient documentation

## 2013-11-07 DIAGNOSIS — R9431 Abnormal electrocardiogram [ECG] [EKG]: Secondary | ICD-10-CM | POA: Insufficient documentation

## 2013-11-07 NOTE — Progress Notes (Signed)
Clinical Summary Mathew Lara is a 53 y.o.male referred for cardiology consultation by Dr. Ethlyn Lara. Reviewed available records. Patient has been seen in the ER, and also primary care office visits for management of hypertension, reportedly somewhat difficult to control. His most recent medical regimen has included clonidine, losartan, Lasix and Norvasc, although this has been adjusted due to documented hypotension which may have been related to inadvertent overuse of medications. He tells me that his daughter usually helps him put medications in a pill container, but when this did not occur, he was trying to manage things himself and had difficulty with it. He is now on Norvasc and Cozaar alone, blood pressure reflected below.  His history includes alcohol abuse at the time he was going through significant stressors in his life. Also noncompliant with his medications at that time. He states that now he drinks no more than a few beers on the weekends, nothing every day. He reports no history of cardiovascular disease or cardiomyopathy, no cardiac arrhythmias. His ECG has been chronically abnormal as reviewed below.  Lab work from January reviewed finding hemoglobin 14.9, platelets 217, potassium 4.6, BUN 27, creatinine 2.2, troponin I less than 0.30. ECG from January showed sinus rhythm with borderline LVH, ST-T wave abnormalities, specifically fairly diffuse ST segment elevation with inferolateral T wave inversions, possibly unusual repolarization pattern, pericarditis or injury not excluded. Similar but somewhat progressed compared to tracing from October 2014.  Renal artery duplex from December 2014 was normal.  No Known Allergies  Current Outpatient Prescriptions  Medication Sig Dispense Refill  . amLODipine (NORVASC) 10 MG tablet Take 10 mg by mouth daily.      Marland Kitchen aspirin EC 81 MG tablet Take 81 mg by mouth daily.      . cyclobenzaprine (FLEXERIL) 5 MG tablet Take 1 tablet (5 mg total) by  mouth 3 (three) times daily as needed for muscle spasms.  15 tablet  0  . losartan (COZAAR) 50 MG tablet Take 50 mg by mouth daily.      . Multiple Vitamin (MULTIVITAMIN WITH MINERALS) TABS tablet Take 1 tablet by mouth daily.      . naproxen (NAPROSYN) 500 MG tablet Take 1 tablet (500 mg total) by mouth 2 (two) times daily.  20 tablet  0  . pseudoephedrine-acetaminophen (TYLENOL SINUS) 30-500 MG TABS Take 2 tablets by mouth every 4 (four) hours as needed (sinus).       No current facility-administered medications for this visit.    Past Medical History  Diagnosis Date  . Anxiety   . Hypertension   . Bronchitis   . Mixed hyperlipidemia   . Alcohol abuse     History reviewed. No pertinent past surgical history.  Family History  Problem Relation Age of Onset  . Cancer Mother     Social History Mathew Lara reports that he has been smoking Cigarettes.  He has been smoking about 0.00 packs per day. He does not have any smokeless tobacco history on file. Mathew Lara reports that he drinks alcohol.  Review of Systems No palpitations or syncope. No orthopnea or PND. No leg edema. Otherwise as outlined above.  Physical Examination Filed Vitals:   11/08/13 0848  BP: 145/98  Pulse: 95   Filed Weights   11/08/13 0848  Weight: 231 lb (104.781 kg)   Patient appears comfortable at rest. HEENT: Conjunctiva and lids normal, oropharynx clear. Neck: Supple, no elevated JVP or carotid bruits, no thyromegaly. Lungs: Clear to auscultation, nonlabored breathing  at rest. Cardiac: Regular rate and rhythm, soft S4, no significant systolic murmur, no pericardial rub. Abdomen: Soft, nontender, bowel sounds present.  Extremities: No pitting edema, distal pulses 2+. Skin: Warm and dry. Musculoskeletal: No kyphosis. Neuropsychiatric: Alert and oriented x3, affect grossly appropriate.   Problem List and Plan   Essential hypertension, benign I suspect his prior difficulties with severe  hypertension were related to alcohol abuse and noncompliance with his medications. Although his blood pressure is elevated today, the trend is clearly better than in the past on record review. He is now on Norvasc and Cozaar under the direction of Dr. Ethlyn Lara. I recommended that he keep alcohol intake at a minimum, we discussed sodium restriction, also basic exercise regimen. We reviewed the importance of compliance with his medications, and keeping regular followup with primary care for blood pressure observation as he will probably need further titration of doses over time. His recent lab work does not suggest any definite evidence of secondary causes of hypertension with normal potassium, no evidence of renal artery stenosis. He does have evidence of renal insufficiency unfortunately, may have some end organ involvement. We are going to obtain an echocardiogram in light of his abnormal ECG to assess cardiac structure and function, principally to exclude any associated cardiomyopathy. Will inform him of the results.  Abnormal ECG Most likely a repolarization abnormality variant. He reports no history of ischemic heart disease, cardiac arrhythmia, or cardiomyopathy. As noted above, echocardiogram will be obtained.  History of alcohol abuse We discussed this today and he states that he has cut back significantly.  Tobacco abuse Smoking cessation recommended.    Satira Sark, M.D., F.A.C.C.

## 2013-11-08 ENCOUNTER — Ambulatory Visit (INDEPENDENT_AMBULATORY_CARE_PROVIDER_SITE_OTHER): Payer: BC Managed Care – PPO | Admitting: Cardiology

## 2013-11-08 ENCOUNTER — Encounter: Payer: Self-pay | Admitting: Cardiology

## 2013-11-08 VITALS — BP 145/98 | HR 95 | Ht 66.0 in | Wt 231.0 lb

## 2013-11-08 DIAGNOSIS — Z720 Tobacco use: Secondary | ICD-10-CM | POA: Insufficient documentation

## 2013-11-08 DIAGNOSIS — Z87891 Personal history of nicotine dependence: Secondary | ICD-10-CM | POA: Insufficient documentation

## 2013-11-08 DIAGNOSIS — F17200 Nicotine dependence, unspecified, uncomplicated: Secondary | ICD-10-CM

## 2013-11-08 DIAGNOSIS — F1011 Alcohol abuse, in remission: Secondary | ICD-10-CM

## 2013-11-08 DIAGNOSIS — I10 Essential (primary) hypertension: Secondary | ICD-10-CM

## 2013-11-08 DIAGNOSIS — R9431 Abnormal electrocardiogram [ECG] [EKG]: Secondary | ICD-10-CM

## 2013-11-08 NOTE — Assessment & Plan Note (Signed)
Smoking cessation recommended 

## 2013-11-08 NOTE — Assessment & Plan Note (Signed)
I suspect his prior difficulties with severe hypertension were related to alcohol abuse and noncompliance with his medications. Although his blood pressure is elevated today, the trend is clearly better than in the past on record review. He is now on Norvasc and Cozaar under the direction of Dr. Ethlyn Gallery. I recommended that he keep alcohol intake at a minimum, we discussed sodium restriction, also basic exercise regimen. We reviewed the importance of compliance with his medications, and keeping regular followup with primary care for blood pressure observation as he will probably need further titration of doses over time. His recent lab work does not suggest any definite evidence of secondary causes of hypertension with normal potassium, no evidence of renal artery stenosis. He does have evidence of renal insufficiency unfortunately, may have some end organ involvement. We are going to obtain an echocardiogram in light of his abnormal ECG to assess cardiac structure and function, principally to exclude any associated cardiomyopathy. Will inform him of the results.

## 2013-11-08 NOTE — Assessment & Plan Note (Signed)
Most likely a repolarization abnormality variant. He reports no history of ischemic heart disease, cardiac arrhythmia, or cardiomyopathy. As noted above, echocardiogram will be obtained.

## 2013-11-08 NOTE — Patient Instructions (Signed)
Your physician recommends that you schedule a follow-up appointment as needed  Your physician has requested that you have an echocardiogram. Echocardiography is a painless test that uses sound waves to create images of your heart. It provides your doctor with information about the size and shape of your heart and how well your heart's chambers and valves are working. This procedure takes approximately one hour. There are no restrictions for this procedure.   We will call you with the results    Your physician recommends that you continue on your current medications as directed. Please refer to the Current Medication list given to you today.   Thank you for choosing [facility] !

## 2013-11-08 NOTE — Assessment & Plan Note (Signed)
We discussed this today and he states that he has cut back significantly.

## 2013-11-12 ENCOUNTER — Encounter: Payer: Self-pay | Admitting: Cardiovascular Disease

## 2013-11-13 ENCOUNTER — Ambulatory Visit (HOSPITAL_COMMUNITY)
Admission: RE | Admit: 2013-11-13 | Discharge: 2013-11-13 | Disposition: A | Discharge status: Home / Self Care | Payer: BC Managed Care – PPO | Source: Ambulatory Visit | Attending: Cardiology | Admitting: Cardiology

## 2013-11-13 DIAGNOSIS — R9431 Abnormal electrocardiogram [ECG] [EKG]: Secondary | ICD-10-CM | POA: Insufficient documentation

## 2013-11-13 DIAGNOSIS — I10 Essential (primary) hypertension: Secondary | ICD-10-CM | POA: Insufficient documentation

## 2013-11-13 DIAGNOSIS — I517 Cardiomegaly: Secondary | ICD-10-CM

## 2013-11-13 DIAGNOSIS — Z6837 Body mass index (BMI) 37.0-37.9, adult: Secondary | ICD-10-CM | POA: Insufficient documentation

## 2013-11-13 NOTE — Progress Notes (Signed)
*  PRELIMINARY RESULTS* Echocardiogram 2D Echocardiogram has been performed.  [place], [place] 11/13/2013, 11:16 AM

## 2013-11-27 ENCOUNTER — Telehealth: Payer: Self-pay | Admitting: Cardiology

## 2013-11-27 NOTE — Telephone Encounter (Signed)
NA,Nm when I tried to call patient with echo results

## 2013-11-27 NOTE — Telephone Encounter (Signed)
Patient states that he came for Echo on 3/10 and has not gotten the results. Please call patient with these. / tgs

## 2014-01-02 ENCOUNTER — Encounter (HOSPITAL_COMMUNITY): Payer: Self-pay | Admitting: Emergency Medicine

## 2014-01-02 ENCOUNTER — Emergency Department (HOSPITAL_COMMUNITY)
Admission: EM | Admit: 2014-01-02 | Discharge: 2014-01-02 | Disposition: A | Discharge status: Home / Self Care | Payer: BC Managed Care – PPO | Attending: Emergency Medicine | Admitting: Emergency Medicine

## 2014-01-02 DIAGNOSIS — Z7982 Long term (current) use of aspirin: Secondary | ICD-10-CM | POA: Insufficient documentation

## 2014-01-02 DIAGNOSIS — K219 Gastro-esophageal reflux disease without esophagitis: Secondary | ICD-10-CM

## 2014-01-02 DIAGNOSIS — K625 Hemorrhage of anus and rectum: Secondary | ICD-10-CM

## 2014-01-02 DIAGNOSIS — F17200 Nicotine dependence, unspecified, uncomplicated: Secondary | ICD-10-CM | POA: Insufficient documentation

## 2014-01-02 DIAGNOSIS — K439 Ventral hernia without obstruction or gangrene: Secondary | ICD-10-CM

## 2014-01-02 DIAGNOSIS — I10 Essential (primary) hypertension: Secondary | ICD-10-CM | POA: Insufficient documentation

## 2014-01-02 DIAGNOSIS — Z79899 Other long term (current) drug therapy: Secondary | ICD-10-CM | POA: Insufficient documentation

## 2014-01-02 DIAGNOSIS — J029 Acute pharyngitis, unspecified: Secondary | ICD-10-CM

## 2014-01-02 LAB — POC OCCULT BLOOD, ED: Fecal Occult Bld: POSITIVE — AB

## 2014-01-02 MED ORDER — ESOMEPRAZOLE MAGNESIUM 20 MG PO CPDR: 20.0000 mg | DELAYED_RELEASE_CAPSULE | Freq: Every day | ORAL | Status: DC

## 2014-01-02 MED ORDER — HYDROCORTISONE 2.5 % RE CREA: TOPICAL_CREAM | RECTAL | Status: DC

## 2014-01-02 MED ORDER — MAGIC MOUTHWASH W/LIDOCAINE: 5.0000 mL | Freq: Three times a day (TID) | ORAL | Status: DC | PRN

## 2014-01-02 NOTE — ED Notes (Signed)
Pt with multiple complaints 1- throat irritation at times esp with his cologne per pt 2- cough 3- noticed blood on tissue with wiping after having a BM, no known hemorrhoids per pt, LBM this morning and normal per pt, states with walking notices irritation to rectum

## 2014-01-02 NOTE — Discharge Instructions (Signed)
Bloody Stools Bloody stools means there is blood in your poop (stool). It is a sign that there is a problem somewhere in the digestive system. It is important for your doctor to find the cause of your bleeding, so the problem can be treated.  HOME CARE  Only take medicine as told by your doctor.  Eat foods with fiber (prunes, bran cereals).  Drink enough fluids to keep your pee (urine) clear or pale yellow.  Sit in warm water (sitz bath) for 10 to 15 minutes as told by your doctor.  Know how to take your medicines (enemas, suppositories) if advised by your doctor.  Watch for signs that you are getting better or getting worse. GET HELP RIGHT AWAY IF:   You are not getting better.  You start to get better but then get worse again.  You have new problems.  You have severe bleeding from the place where poop comes out (rectum) that does not stop.  You throw up (vomit) blood.  You feel weak or pass out (faint).  You have a fever. MAKE SURE YOU:   Understand these instructions.  Will watch your condition.  Will get help right away if you are not doing well or get worse. Document Released: 08/11/2009 Document Revised: 11/15/2011 Document Reviewed: 01/08/2011 [facility] Patient Information 2014 [place].  Diet for Gastroesophageal Reflux Disease, Adult Reflux is when stomach acid flows up into the esophagus. The esophagus becomes irritated and sore (inflammation). When reflux happens often and is severe, it is called gastroesophageal reflux disease (GERD). What you eat can help ease any discomfort caused by GERD. FOODS OR DRINKS TO AVOID OR LIMIT  Coffee and black tea, with or without caffeine.  Bubbly (carbonated) drinks with caffeine or energy drinks.  Strong spices, such as pepper, cayenne pepper, curry, or chili powder.  Peppermint or spearmint.  Chocolate.  High-fat foods, such as meats, fried food, oils, butter, or nuts.  Fruits and vegetables that cause  discomfort. This includes citrus fruits and tomatoes.  Alcohol. If a certain food or drink irritates your GERD, avoid eating or drinking it. THINGS THAT MAY HELP GERD INCLUDE:  Eat meals slowly.  Eat 5 to 6 small meals a day, not 3 large meals.  Do not eat food for a certain amount of time if it causes discomfort.  Wait 3 hours after eating before lying down.  Keep the head of your bed raised 6 to 9 inches (15 23 centimeters). Put a foam wedge or blocks under the legs of the bed.  Stay active. Weight loss, if needed, may help ease your discomfort.  Wear loose-fitting clothing.  Do not smoke or chew tobacco. Document Released: 02/22/2012 Document Reviewed: 02/22/2012 [facility] Patient Information 2014 [place].  Hernia A hernia happens when an organ inside your body pushes out through a weak spot in your belly (abdominal) wall. Most hernias get worse over time. They can often be pushed back into place (reduced). Surgery may be needed to repair hernias that cannot be pushed into place. HOME CARE  Keep doing normal activities.  Avoid lifting more than 10 pounds (4.5 kilograms).  Cough gently and avoid straining. Over time, these things will:  Increase your hernia size.  Irritate your hernia.  Break down hernia repairs.  Stop smoking.  Do not wear anything tight over your hernia. Do not keep the hernia in with an outside bandage.  Eat food that is high in fiber (fruit, vegetables, whole grains).  Drink enough fluids  to keep your pee (urine) clear or pale yellow.  Take medicines to make your poop soft (stool softeners) if you cannot poop (constipated). GET HELP RIGHT AWAY IF:   You have a fever.  You have belly pain that gets worse.  You feel sick to your stomach (nauseous) and throw up (vomit).  Your skin starts to bulge out.  Your hernia turns a different color, feels hard, or is tender.  You have increased pain or puffiness (swelling) around the  hernia.  You poop more or less often.  Your poop does not look the way normally does.  You have watery poop (diarrhea).  You cannot push the hernia back in place by applying gentle pressure while lying down. MAKE SURE YOU:   Understand these instructions.  Will watch your condition.  Will get help right away if you are not doing well or get worse. Document Released: 02/10/2010 Document Revised: 11/15/2011 Document Reviewed: 02/10/2010 [facility] Patient Information 2014 [place].

## 2014-01-02 NOTE — ED Notes (Signed)
Sore throat, non-productive cough, and rectal pain with BMs x 1-2 wks.

## 2014-01-02 NOTE — ED Provider Notes (Signed)
CSN: [id number]     Arrival date & time 01/02/14  1057 History   First MD Initiated Contact with Patient 01/02/14 1220     Chief Complaint  Patient presents with  . multiple complaints      (Consider location/radiation/quality/duration/timing/severity/associated sxs/prior Treatment) HPI Comments: Mathew Lara is a 53 y.o. male who presents to the Emergency Department with multiple complaints.  He states that he has sore throat, cough and nasal congestion for 1-2 weeks.  He states that his throat is intermittently sore and gets worse with lying down and first thing in the mornings.  He also c/o occasional cough that is dry and "tickles at my throat".  Patient also c/o pain with bowel movements and intermittent bright red blood from his rectum after defecation.   He also reports occasional itching of his rectum.  He denies fever, chills, abdominal pain, discolored stools, dizziness or weakness.  He does state that he has a poor diet and sometimes has to strain to have a BM.    The history is provided by the patient.    Past Medical History  Diagnosis Date  . Anxiety   . Hypertension   . Bronchitis   . Mixed hyperlipidemia   . Alcohol abuse    History reviewed. No pertinent past surgical history. Family History  Problem Relation Age of Onset  . Cancer Mother    History  Substance Use Topics  . Smoking status: Current Every Day Smoker    Types: Cigarettes  . Smokeless tobacco: Not on file  . Alcohol Use: Yes     Comment: Reports a few beers on the weekends at this point    Review of Systems  Constitutional: Negative for fever, chills, activity change and appetite change.  HENT: Positive for congestion, rhinorrhea and sore throat. Negative for facial swelling and trouble swallowing.   Eyes: Negative for visual disturbance.  Respiratory: Positive for cough. Negative for chest tightness, shortness of breath, wheezing and stridor.   Cardiovascular: Negative for chest pain.   Gastrointestinal: Positive for constipation, blood in stool and rectal pain. Negative for nausea, vomiting, abdominal pain, diarrhea and abdominal distention.  Genitourinary: Negative for dysuria.  Musculoskeletal: Negative for back pain, neck pain and neck stiffness.  Skin: Negative.  Negative for rash.  Neurological: Negative for dizziness, syncope, weakness, numbness and headaches.  Hematological: Negative for adenopathy.  Psychiatric/Behavioral: Negative for confusion.  All other systems reviewed and are negative.     Allergies  Review of patient's allergies indicates no known allergies.  Home Medications   Prior to Admission medications   Medication Sig Start Date End Date Taking? Authorizing Provider  amLODipine (NORVASC) 10 MG tablet Take 10 mg by mouth daily.   Yes Historical Provider, MD  aspirin EC 81 MG tablet Take 81 mg by mouth daily.   Yes Historical Provider, MD  losartan (COZAAR) 50 MG tablet Take 50 mg by mouth daily.   Yes Historical Provider, MD  Pseudoeph-Doxylamine-DM-APAP (NYQUIL PO) Take 60 mLs by mouth daily as needed (pain).   Yes Historical Provider, MD   BP 147/96  Pulse 107  Temp(Src) 98.3 F (36.8 C) (Oral)  Resp 20  Ht 5\' 6"  (1.676 m)  Wt 231 lb (104.781 kg)  BMI 37.30 kg/m2  SpO2 96% Physical Exam  Nursing note and vitals reviewed. Constitutional: He is oriented to person, place, and time. He appears well-developed and well-nourished. No distress.  HENT:  Head: Normocephalic and atraumatic.  Right Ear: Tympanic membrane and ear  canal normal.  Left Ear: Tympanic membrane and ear canal normal.  Mouth/Throat: Uvula is midline and mucous membranes are normal. No trismus in the jaw. No uvula swelling. No oropharyngeal exudate, posterior oropharyngeal edema, posterior oropharyngeal erythema or tonsillar abscesses.  Neck: Normal range of motion. Neck supple.  Cardiovascular: Normal rate, regular rhythm, normal heart sounds and intact distal pulses.    No murmur heard. Pulmonary/Chest: Effort normal and breath sounds normal. No respiratory distress. He has no wheezes. He has no rales.  Abdominal: Soft. Bowel sounds are normal. He exhibits no distension. There is no splenomegaly. There is no tenderness. There is no rebound and no guarding.  Pt has an abdominal wall hernia  That spontaneously resolves when at rest and supine.  abd is soft, NT,  Genitourinary: Rectal exam shows no external hemorrhoid, no internal hemorrhoid, no fissure, no mass, no tenderness and anal tone normal.  Brown, heme-positive stool. No rectal masses. Rectal tone is normal.  No palpable internal hemorrhoids, digital exam is limited by body habitus.    Musculoskeletal: Normal range of motion.  Lymphadenopathy:    He has no cervical adenopathy.  Neurological: He is alert and oriented to person, place, and time. He exhibits normal muscle tone. Coordination normal.  Skin: Skin is warm and dry.    ED Course  Procedures (including critical care time) Labs Review Labs Reviewed  POC OCCULT BLOOD, ED - Abnormal; Notable for the following:    Fecal Occult Bld POSITIVE (*)    All other components within normal limits    Imaging Review No results found.   EKG Interpretation None      MDM   Final diagnoses:  Bright red rectal bleeding  Acid reflux  Pharyngitis  Abdominal wall hernia      Patient has appointment with his primary care physician on May 15. Vital signs are stable. Patient is well-appearing and nontoxic. Intermittent sore throat likely related to acid reflux.  No concerning symptoms or acute abdomen at this time.  abd wall hernia is incidental finding.   I advised patient to strict return precautions if he develops abdominal pain, vomiting, fever, or if hernia is not spontaneously reducing to return to ED.  Pt verbalized understanding and agrees to plan.  He appears stable for d/c.  rx written for nexium, anusol hc cream and magic mouthwash.     Annmargaret Decaprio L. Zyona Pettaway, PA-C 01/04/14 1440

## 2014-01-07 NOTE — ED Provider Notes (Signed)
Medical screening examination/treatment/procedure(s) were performed by non-physician practitioner and as supervising physician I was immediately available for consultation/collaboration.   EKG Interpretation None        Mervin Kung, MD 01/07/14 [phone]

## 2014-01-18 ENCOUNTER — Ambulatory Visit (HOSPITAL_COMMUNITY)
Admission: RE | Admit: 2014-01-18 | Discharge: 2014-01-18 | Disposition: A | Discharge status: Home / Self Care | Payer: BC Managed Care – PPO | Source: Ambulatory Visit | Attending: Family Medicine | Admitting: Family Medicine

## 2014-01-18 ENCOUNTER — Other Ambulatory Visit (HOSPITAL_COMMUNITY): Payer: Self-pay | Admitting: Family Medicine

## 2014-01-18 DIAGNOSIS — R059 Cough, unspecified: Secondary | ICD-10-CM

## 2014-01-18 DIAGNOSIS — R05 Cough: Secondary | ICD-10-CM

## 2014-01-18 DIAGNOSIS — R0989 Other specified symptoms and signs involving the circulatory and respiratory systems: Secondary | ICD-10-CM | POA: Insufficient documentation

## 2014-01-21 ENCOUNTER — Telehealth: Payer: Self-pay

## 2014-01-21 ENCOUNTER — Other Ambulatory Visit: Payer: Self-pay

## 2014-01-21 DIAGNOSIS — K219 Gastro-esophageal reflux disease without esophagitis: Secondary | ICD-10-CM

## 2014-01-21 DIAGNOSIS — K625 Hemorrhage of anus and rectum: Secondary | ICD-10-CM

## 2014-01-21 MED ORDER — PEG-KCL-NACL-NASULF-NA ASC-C 100 G PO SOLR: 1.0000 | ORAL | Status: DC

## 2014-01-21 NOTE — Telephone Encounter (Signed)
Rx sent to the pharmacy and instructions given to his daughter, Sofie Rower.

## 2014-01-21 NOTE — Telephone Encounter (Signed)
I called [employer] at [phone] and spoke to [employer]. Who said that a PA is not required for the colonoscopy or the possible EGD.

## 2014-01-21 NOTE — Telephone Encounter (Signed)
Called pt to triage for colonoscopy and possible EGD per Dr. Oneida Alar. He told me I could get his medical info and meds from his daughter, Sofie Rower

## 2014-01-21 NOTE — Telephone Encounter (Signed)
MOVI PREP SPLIT DOSING, FULL LIQUIDS WITH BREAKFAST.

## 2014-01-21 NOTE — Telephone Encounter (Signed)
Gastroenterology Pre-Procedure Review  Request Date: 01/21/2014 Requesting Physician: Dr. Oneida Alar  PATIENT REVIEW QUESTIONS: The patient responded to the following health history questions as indicated:    Pt drinks 1-2 beers weekly  1. Diabetes Melitis: no 2. Joint replacements in the past 12 months: no 3. Major health problems in the past 3 months: no 4. Has an artificial valve or MVP: no 5. Has a defibrillator: no 6. Has been advised in past to take antibiotics in advance of a procedure like teeth cleaning: no    MEDICATIONS & ALLERGIES:    Patient reports the following regarding taking any blood thinners:   Plavix? no Aspirin?YES Coumadin? no  Patient confirms/reports the following medications:  Current Outpatient Prescriptions  Medication Sig Dispense Refill  . Alum & Mag Hydroxide-Simeth (MAGIC MOUTHWASH W/LIDOCAINE) SOLN Take 5 mLs by mouth 3 (three) times daily as needed for mouth pain. Swish and spit TID.  Do not swallow  60 mL  0  . amLODipine (NORVASC) 10 MG tablet Take 10 mg by mouth daily.      Marland Kitchen aspirin EC 81 MG tablet Take 81 mg by mouth daily.      Marland Kitchen esomeprazole (NEXIUM) 20 MG capsule Take 1 capsule (20 mg total) by mouth daily.  30 capsule  0  . hydrocortisone (ANUSOL-HC) 2.5 % rectal cream Apply rectally 2 times daily  30 g  0  . losartan (COZAAR) 50 MG tablet Take 50 mg by mouth daily.      . Pseudoeph-Doxylamine-DM-APAP (NYQUIL PO) Take 60 mLs by mouth daily as needed (pain).       No current facility-administered medications for this visit.    Patient confirms/reports the following allergies:  No Known Allergies  No orders of the defined types were placed in this encounter.    AUTHORIZATION INFORMATION Primary Insurance:  ID #:   Group #:  Pre-Cert / Auth required:  Pre-Cert / Auth #:   Secondary Insurance:   ID #:   Group #:  Pre-Cert / Auth required:  Pre-Cert / Auth #:   SCHEDULE INFORMATION: Procedure has been scheduled as follows:  Date:   01/23/2014                    Time:  10:30 AM Location: [facility] Short Stay  This Gastroenterology Pre-Precedure Review Form is being routed to the following provider(s): Barney Drain, MD

## 2014-01-22 ENCOUNTER — Encounter (HOSPITAL_COMMUNITY): Payer: Self-pay | Admitting: Pharmacy Technician

## 2014-01-23 ENCOUNTER — Ambulatory Visit (HOSPITAL_COMMUNITY)
Admission: RE | Admit: 2014-01-23 | Discharge: 2014-01-23 | Disposition: A | Discharge status: Home / Self Care | Payer: BC Managed Care – PPO | Source: Ambulatory Visit | Attending: Gastroenterology | Admitting: Gastroenterology

## 2014-01-23 ENCOUNTER — Encounter (HOSPITAL_COMMUNITY): Payer: Self-pay | Admitting: *Deleted

## 2014-01-23 ENCOUNTER — Encounter (HOSPITAL_COMMUNITY)
Admission: RE | Disposition: A | Discharge status: Home / Self Care | Payer: Self-pay | Source: Ambulatory Visit | Attending: Gastroenterology

## 2014-01-23 DIAGNOSIS — K449 Diaphragmatic hernia without obstruction or gangrene: Secondary | ICD-10-CM | POA: Insufficient documentation

## 2014-01-23 DIAGNOSIS — K620 Anal polyp: Secondary | ICD-10-CM | POA: Insufficient documentation

## 2014-01-23 DIAGNOSIS — Q391 Atresia of esophagus with tracheo-esophageal fistula: Secondary | ICD-10-CM | POA: Insufficient documentation

## 2014-01-23 DIAGNOSIS — Q393 Congenital stenosis and stricture of esophagus: Secondary | ICD-10-CM

## 2014-01-23 DIAGNOSIS — F411 Generalized anxiety disorder: Secondary | ICD-10-CM | POA: Insufficient documentation

## 2014-01-23 DIAGNOSIS — Z7982 Long term (current) use of aspirin: Secondary | ICD-10-CM | POA: Insufficient documentation

## 2014-01-23 DIAGNOSIS — E785 Hyperlipidemia, unspecified: Secondary | ICD-10-CM | POA: Insufficient documentation

## 2014-01-23 DIAGNOSIS — K621 Rectal polyp: Principal | ICD-10-CM

## 2014-01-23 DIAGNOSIS — K2940 Chronic atrophic gastritis without bleeding: Secondary | ICD-10-CM | POA: Insufficient documentation

## 2014-01-23 DIAGNOSIS — F17200 Nicotine dependence, unspecified, uncomplicated: Secondary | ICD-10-CM | POA: Insufficient documentation

## 2014-01-23 DIAGNOSIS — I10 Essential (primary) hypertension: Secondary | ICD-10-CM | POA: Insufficient documentation

## 2014-01-23 DIAGNOSIS — K625 Hemorrhage of anus and rectum: Secondary | ICD-10-CM

## 2014-01-23 DIAGNOSIS — Z79899 Other long term (current) drug therapy: Secondary | ICD-10-CM | POA: Insufficient documentation

## 2014-01-23 DIAGNOSIS — K219 Gastro-esophageal reflux disease without esophagitis: Secondary | ICD-10-CM

## 2014-01-23 DIAGNOSIS — K648 Other hemorrhoids: Secondary | ICD-10-CM | POA: Insufficient documentation

## 2014-01-23 DIAGNOSIS — Q438 Other specified congenital malformations of intestine: Secondary | ICD-10-CM | POA: Insufficient documentation

## 2014-01-23 HISTORY — PX: COLONOSCOPY: SHX5424

## 2014-01-23 HISTORY — PX: HEMORRHOID BANDING: SHX5850

## 2014-01-23 HISTORY — PX: ESOPHAGOGASTRODUODENOSCOPY: SHX5428

## 2014-01-23 SURGERY — COLONOSCOPY
Anesthesia: Moderate Sedation

## 2014-01-23 MED ORDER — MIDAZOLAM HCL 5 MG/5ML IJ SOLN
INTRAMUSCULAR | Status: DC | PRN
  Administered 2014-01-23 (×3): 1 mg via INTRAVENOUS
  Administered 2014-01-23: 2 mg via INTRAVENOUS
  Administered 2014-01-23: 1 mg via INTRAVENOUS
  Administered 2014-01-23: 2 mg via INTRAVENOUS

## 2014-01-23 MED ORDER — MIDAZOLAM HCL 5 MG/5ML IJ SOLN
INTRAMUSCULAR | Status: AC
  Filled 2014-01-23: qty 10

## 2014-01-23 MED ORDER — LIDOCAINE VISCOUS 2 % MT SOLN
OROMUCOSAL | Status: DC | PRN
  Administered 2014-01-23: 1 via OROMUCOSAL

## 2014-01-23 MED ORDER — MEPERIDINE HCL 100 MG/ML IJ SOLN
INTRAMUSCULAR | Status: AC
  Filled 2014-01-23: qty 2

## 2014-01-23 MED ORDER — ESOMEPRAZOLE MAGNESIUM 40 MG PO CPDR: DELAYED_RELEASE_CAPSULE | ORAL | Status: DC

## 2014-01-23 MED ORDER — MEPERIDINE HCL 100 MG/ML IJ SOLN
INTRAMUSCULAR | Status: DC | PRN
  Administered 2014-01-23: 25 mg via INTRAVENOUS
  Administered 2014-01-23: 50 mg via INTRAVENOUS
  Administered 2014-01-23: 25 mg via INTRAVENOUS

## 2014-01-23 MED ORDER — SODIUM CHLORIDE 0.9 % IV SOLN
INTRAVENOUS | Status: DC
  Administered 2014-01-23: 10:00:00 via INTRAVENOUS

## 2014-01-23 MED ORDER — STERILE WATER FOR IRRIGATION IR SOLN
Status: DC | PRN
  Administered 2014-01-23: 10:00:00

## 2014-01-23 MED ORDER — LIDOCAINE VISCOUS 2 % MT SOLN
OROMUCOSAL | Status: AC
  Filled 2014-01-23: qty 15

## 2014-01-23 NOTE — H&P (Signed)
  Primary Care Physician:  Rocky Morel, MD Primary Gastroenterologist:  Dr. Oneida Alar  Pre-Procedure History & Physical: HPI:  Mathew Lara is a 53 y.o. male here for BRBPR/DYSPEPSIA.  Past Medical History  Diagnosis Date  . Anxiety   . Hypertension   . Bronchitis   . Mixed hyperlipidemia   . Alcohol abuse     History reviewed. No pertinent past surgical history.  Prior to Admission medications   Medication Sig Start Date End Date Taking? Authorizing Provider  amLODipine (NORVASC) 10 MG tablet Take 10 mg by mouth daily.   Yes Historical Provider, MD  aspirin EC 81 MG tablet Take 81 mg by mouth daily.   Yes Historical Provider, MD  esomeprazole (NEXIUM) 20 MG capsule Take 1 capsule (20 mg total) by mouth daily. 01/02/14  Yes Tammy L. Triplett, PA-C  hydrocortisone (ANUSOL-HC) 2.5 % rectal cream Apply rectally 2 times daily 01/02/14  Yes Tammy L. Triplett, PA-C  losartan (COZAAR) 50 MG tablet Take 50 mg by mouth daily.   Yes Historical Provider, MD  predniSONE (STERAPRED UNI-PAK) 5 MG TABS tablet Take 5 mg by mouth taper from 4 doses each day to 1 dose and stop.   Yes Historical Provider, MD  Pseudoeph-Doxylamine-DM-APAP (NYQUIL PO) Take 60 mLs by mouth daily as needed (pain).   Yes Historical Provider, MD  Alum & Mag Hydroxide-Simeth (MAGIC MOUTHWASH W/LIDOCAINE) SOLN Take 5 mLs by mouth 3 (three) times daily as needed for mouth pain. Swish and spit TID.  Do not swallow 01/02/14   Tammy L. Triplett, PA-C    Allergies as of 01/21/2014  . (No Known Allergies)    Family History  Problem Relation Age of Onset  . Cancer Mother     History   Social History  . Marital Status: Legally Separated    Spouse Name: N/A    Number of Children: N/A  . Years of Education: N/A   Occupational History  . Not on file.   Social History Main Topics  . Smoking status: Current Every Day Smoker    Types: Cigarettes  . Smokeless tobacco: Not on file  . Alcohol Use: Yes     Comment:  Reports a few beers on the weekends at this point  . Drug Use: No  . Sexual Activity: Not on file   Other Topics Concern  . Not on file   Social History Narrative           Review of Systems: See HPI, otherwise negative ROS   Physical Exam: BP 135/98  Pulse 88  Temp(Src) 97.8 F (36.6 C) (Oral)  Resp 20  SpO2 98% General:   Alert,  pleasant and cooperative in NAD Head:  Normocephalic and atraumatic. Neck:  Supple; Lungs:  Clear throughout to auscultation.    Heart:  Regular rate and rhythm. Abdomen:  Soft, nontender and nondistended. Normal bowel sounds, without guarding, and without rebound.   Neurologic:  Alert and  oriented x4;  grossly normal neurologically.  Impression/Plan:    BRBPR/DYSPEPSIA  PLAN: EGD/TCS TODAY

## 2014-01-23 NOTE — Op Note (Addendum)
[facility] [address] [place], [address]   COLONOSCOPY PROCEDURE REPORT  PATIENT: Mathew Lara, Mathew Lara  MR#: [id number] BIRTHDATE: 22-May-1961 , 68  yrs. old GENDER: Male ENDOSCOPIST: Barney Drain, MD REFERRED [id number] Koberlein, M.D. PROCEDURE DATE:  01/23/2014 PROCEDURE:   Colonoscopy with cold biopsy polypectomy INDICATIONS:Rectal Bleeding.  PT VERY ANXIOUS PRIOR TO TCS. MEDICATIONS: Demerol 75 mg IV and Versed 6 mg IV  DESCRIPTION OF PROCEDURE:    Physical exam was performed.  Informed consent was obtained from the patient after explaining the benefits, risks, and alternatives to procedure.  The patient was connected to monitor and placed in left lateral position. Continuous oxygen was provided by nasal cannula and IV medicine administered through an indwelling cannula.  After administration of sedation and rectal exam, the patients rectum was intubated and the [id number])  colonoscope was advanced under direct visualization to the cecum.  The scope was removed slowly by carefully examining the color, texture, anatomy, and integrity mucosa on the way out.  The patient was recovered in endoscopy and discharged home in satisfactory condition.    COLON FINDINGS: The LEFT COLON IS redundant.  Manual abdominal counter-pressure was used to reach the cecum.  The patient was moved on to their back to reach the cecum AND STOMACH. UNABLE TO SEE BEHIND THE IC VALVE. The colon mucosa was otherwise normal.  , Two sessile polyps measuring 2-3 mm in size were found in the rectum.  A polypectomy was performed with cold forceps.  Small internal hemorrhoids were found.  PREP QUALITY: good.  CECAL W/D TIME: 20 minutes  COMPLICATIONS: None  ENDOSCOPIC IMPRESSION: 1.   The LEFT COLON IS EXTREMELY redundant 2.   TWO RECTAL POLYPS REMOVED 3.   SMALL VOLUME RECTAL BLEEDING MOST LIKELY DUE TO Small internal hemorrhoids  RECOMMENDATIONS: LOSE WEIGHT HIGH FIBER/LOW  FAT DIET NEXIUM QAC BREAKFAST AWAIT BIOPSY TCS IN 5 YEARS due incomplete visulization of the cecum. PT NEEDS TCS WITH AN OVERTUBE & PHENERGAN IN PREOP.      _______________________________ eSignedBarney Drain, MD 01/23/2014 6:10 PM Revised: 01/23/2014 6:10 PM

## 2014-01-23 NOTE — Op Note (Signed)
[facility] [address] [place], [address]   ENDOSCOPY PROCEDURE REPORT  PATIENT: Mathew Lara, Mathew Lara  MR#: [id number] BIRTHDATE: 30-Jun-1961 , 31  yrs. old GENDER: Male  ENDOSCOPIST: Barney Drain, MD REFERRED [id number] Koberlein, M.D.  PROCEDURE DATE: 01/23/2014 PROCEDURE:   EGD w/ biopsy  INDICATIONS:Dyspepsia. MEDICATIONS: TCS +  Demerol 25 mg IV and Versed 2 mg IV TOPICAL ANESTHETIC:   Viscous Xylocaine  DESCRIPTION OF PROCEDURE:     Physical exam was performed.  Informed consent was obtained from the patient after explaining the benefits, risks, and alternatives to the procedure.  The patient was connected to the monitor and placed in the left lateral position.  Continuous oxygen was provided by nasal cannula and IV medicine administered through an indwelling cannula.  After administration of sedation, the patients esophagus was intubated and the [id number] and [id number])  endoscope was advanced under direct visualization to the second portion of the duodenum.  The scope was removed slowly by carefully examining the color, texture, anatomy, and integrity of the mucosa on the way out.  The patient was recovered in endoscopy and discharged home in satisfactory condition.   ESOPHAGUS: A Schatzki ring was found at the gastroesophageal junction and was widely open.   A medium sized hiatal hernia was noted.   STOMACH: Mild non-erosive gastritis (inflammation) was found in the gastric antrum.  Multiple biopsies were performed using cold forceps.   DUODENUM: The duodenal mucosa showed no abnormalities in the bulb and second portion of the duodenum.  COMPLICATIONS:   None  ENDOSCOPIC IMPRESSION: 1.   Schatzki ring at the gastroesophageal junction 2.   Medium sized hiatal hernia 3.   NDYSPEPSIA MOST LIKELY DUE TO GERD/MILD Non-erosive gastritis  RECOMMENDATIONS: LOSE WEIGHT HIGH FIBER/LOW FAT DIET NEXIUM 40 MG QAC BREAKFAST AWAIT  BIOPSY TCS IN 5 YEARS   REPEAT EXAM:   _______________________________ Lorrin MaisBarney Drain, MD 01/23/2014 6:09 PM

## 2014-01-23 NOTE — Discharge Instructions (Signed)
YOU HAVE A FLOPPY LEFT COLON. I SAW 99% OF YOUR COLON. You had 2 small RECTAL polyps removed. You have A RING AT THE BASE OF YOUR ESOPHAGUS, AND MEDIUM SIZED HIATAL HERNIA, AND MILD gastritis You have SMALL internal hemorrhoids. I biopsied your stomach.    CONTINUE YOUR WEIGHT LOSS EFFORTS. LOSE 10 LBS.  CONTINUE NEXIUM. TAKE 30 MINUTES BEFORE BREAKFAST.  FOLLOW A HIGH FIBER/LOW FAT DIET. SEE INFO BELOW.   YOUR BIOPSY RESULTS SHOULD BE BACK IN 7 DAYS.  FOLLOW UP IN 4 MOS.  Next colonoscopy in 5 years WITH AN OVERTUBE.   ENDOSCOPY Care After Read the instructions outlined below and refer to this sheet in the next week. These discharge instructions provide you with general information on caring for yourself after you leave the hospital. While your treatment has been planned according to the most current medical practices available, unavoidable complications occasionally occur. If you have any problems or questions after discharge, call DR. Janyra Barillas, [phone].  ACTIVITY  You may resume your regular activity, but move at a slower pace for the next 24 hours.   Take frequent rest periods for the next 24 hours.   Walking will help get rid of the air and reduce the bloated feeling in your belly (abdomen).   No driving for 24 hours (because of the medicine (anesthesia) used during the test).   You may shower.   Do not sign any important legal documents or operate any machinery for 24 hours (because of the anesthesia used during the test).    NUTRITION  Drink plenty of fluids.   You may resume your normal diet as instructed by your doctor.   Begin with a light meal and progress to your normal diet. Heavy or fried foods are harder to digest and may make you feel sick to your stomach (nauseated).   Avoid alcoholic beverages for 24 hours or as instructed.    MEDICATIONS  You may resume your normal medications.   WHAT YOU CAN EXPECT TODAY  Some feelings of bloating in the  abdomen.   Passage of more gas than usual.   Spotting of blood in your stool or on the toilet paper  .  IF YOU HAD POLYPS REMOVED DURING THE ENDOSCOPY:  Eat a soft diet IF YOU HAVE NAUSEA, BLOATING, ABDOMINAL PAIN, OR VOMITING.    FINDING OUT THE RESULTS OF YOUR TEST Not all test results are available during your visit. DR. Oneida Alar WILL CALL YOU WITHIN 7 DAYS OF YOUR PROCEDUE WITH YOUR RESULTS. Do not assume everything is normal if you have not heard from DR. Carrin Vannostrand IN ONE WEEK, CALL HER OFFICE AT [phone].  SEEK IMMEDIATE MEDICAL ATTENTION AND CALL THE OFFICE: [phone] IF:  You have more than a spotting of blood in your stool.   Your belly is swollen (abdominal distention).   You are nauseated or vomiting.   You have a temperature over 101F.   You have abdominal pain or discomfort that is severe or gets worse throughout the day.    Lifestyle and home remedies TO HELP CONTROL HEARTBURN.  You may eliminate or reduce the frequency of heartburn by making the following lifestyle changes:    Control your weight. Being overweight is a major risk factor for heartburn and GERD. Excess pounds put pressure on your abdomen, pushing up your stomach and causing acid to back up into your esophagus.     Eat smaller meals. 4 TO 6 MEALS A DAY. This reduces pressure on the  lower esophageal sphincter, helping to prevent the valve from opening and acid from washing back into your esophagus.     Loosen your belt. Clothes that fit tightly around your waist put pressure on your abdomen and the lower esophageal sphincter.       Eliminate heartburn triggers. Everyone has specific triggers.      Common triggers such as fatty or fried foods, spicy food, tomato sauce, carbonated beverages, alcohol, chocolate, mint, garlic, onion, caffeine and nicotine may make heartburn worse.     Avoid stooping or bending. Tying your shoes is OK. Bending over for longer periods to weed your garden isn't,  especially soon after eating.     Don't lie down after a meal. Wait at least three to four hours after eating before going to bed, and don't lie down right after eating.    Alternative medicine   Several home remedies exist for treating GERD, but they provide only temporary relief. They include drinking baking soda (sodium bicarbonate) added to water or drinking other fluids such as baking soda mixed with cream of tartar and water.   Although these liquids create temporary relief by neutralizing, washing away or buffering acids, eventually they aggravate the situation by adding gas and fluid to your stomach, increasing pressure and causing more acid reflux. Further, adding more sodium to your diet may increase your blood pressure and add stress to your heart, and excessive bicarbonate ingestion can alter the acid-base balance in your body.     Low-Fat Diet BREADS, CEREALS, PASTA, RICE, DRIED PEAS, AND BEANS These products are high in carbohydrates and most are low in fat. Therefore, they can be increased in the diet as substitutes for fatty foods. They too, however, contain calories and should not be eaten in excess. Cereals can be eaten for snacks as well as for breakfast.   FRUITS AND VEGETABLES It is good to eat fruits and vegetables. Besides being sources of fiber, both are rich in vitamins and some minerals. They help you get the daily allowances of these nutrients. Fruits and vegetables can be used for snacks and desserts.  MEATS Limit lean meat, chicken, [place], and fish to no more than 6 ounces per day. Beef, Pork, and Lamb Use lean cuts of beef, pork, and lamb. Lean cuts include:  Extra-lean ground beef.  Arm roast.  Sirloin tip.  Center-cut ham.  Round steak.  Loin chops.  Rump roast.  Tenderloin.  Trim all fat off the outside of meats before cooking. It is not necessary to severely decrease the intake of red meat, but lean choices should be made. Lean meat is rich in protein  and contains a highly absorbable form of iron. Premenopausal women, in particular, should avoid reducing lean red meat because this could increase the risk for low red blood cells (iron-deficiency anemia).  Chicken and [place] These are good sources of protein. The fat of poultry can be reduced by removing the skin and underlying fat layers before cooking. Chicken and [place] can be substituted for lean red meat in the diet. Poultry should not be fried or covered with high-fat sauces. Fish and Shellfish Fish is a good source of protein. Shellfish contain cholesterol, but they usually are low in saturated fatty acids. The preparation of fish is important. Like chicken and [place], they should not be fried or covered with high-fat sauces. EGGS Egg whites contain no fat or cholesterol. They can be eaten often. Try 1 to 2 egg whites instead of whole eggs  in recipes or use egg substitutes that do not contain yolk. MILK AND DAIRY PRODUCTS Use skim or 1% milk instead of 2% or whole milk. Decrease whole milk, natural, and processed cheeses. Use nonfat or low-fat (2%) cottage cheese or low-fat cheeses made from vegetable oils. Choose nonfat or low-fat (1 to 2%) yogurt. Experiment with evaporated skim milk in recipes that call for heavy cream. Substitute low-fat yogurt or low-fat cottage cheese for sour cream in dips and salad dressings. Have at least 2 servings of low-fat dairy products, such as 2 glasses of skim (or 1%) milk each day to help get your daily calcium intake. FATS AND OILS Reduce the total intake of fats, especially saturated fat. Butterfat, lard, and beef fats are high in saturated fat and cholesterol. These should be avoided as much as possible. Vegetable fats do not contain cholesterol, but certain vegetable fats, such as coconut oil, palm oil, and palm kernel oil are very high in saturated fats. These should be limited. These fats are often used in bakery goods, processed foods, popcorn, oils, and  nondairy creamers. Vegetable shortenings and some peanut butters contain hydrogenated oils, which are also saturated fats. Read the labels on these foods and check for saturated vegetable oils. Unsaturated vegetable oils and fats do not raise blood cholesterol. However, they should be limited because they are fats and are high in calories. Total fat should still be limited to 30% of your daily caloric intake. Desirable liquid vegetable oils are corn oil, cottonseed oil, olive oil, canola oil, safflower oil, soybean oil, and sunflower oil. Peanut oil is not as good, but small amounts are acceptable. Buy a heart-healthy tub margarine that has no partially hydrogenated oils in the ingredients. Mayonnaise and salad dressings often are made from unsaturated fats, but they should also be limited because of their high calorie and fat content. Seeds, nuts, peanut butter, olives, and avocados are high in fat, but the fat is mainly the unsaturated type. These foods should be limited mainly to avoid excess calories and fat. OTHER EATING TIPS Snacks  Most sweets should be limited as snacks. They tend to be rich in calories and fats, and their caloric content outweighs their nutritional value. Some good choices in snacks are graham crackers, melba toast, soda crackers, bagels (no egg), English muffins, fruits, and vegetables. These snacks are preferable to snack crackers, [place] fries, TORTILLA CHIPS, and POTATO chips. Popcorn should be air-popped or cooked in small amounts of liquid vegetable oil. Desserts Eat fruit, low-fat yogurt, and fruit ices instead of pastries, cake, and cookies. Sherbet, angel food cake, gelatin dessert, frozen low-fat yogurt, or other frozen products that do not contain saturated fat (pure fruit juice bars, frozen ice pops) are also acceptable.  COOKING METHODS Choose those methods that use little or no fat. They include: Poaching.  Braising.  Steaming.  Grilling.  Baking.  Stir-frying.    Broiling.  Microwaving.  Foods can be cooked in a nonstick pan without added fat, or use a nonfat cooking spray in regular cookware. Limit fried foods and avoid frying in saturated fat. Add moisture to lean meats by using water, broth, cooking wines, and other nonfat or low-fat sauces along with the cooking methods mentioned above. Soups and stews should be chilled after cooking. The fat that forms on top after a few hours in the refrigerator should be skimmed off. When preparing meals, avoid using excess salt. Salt can contribute to raising blood pressure in some people.  EATING AWAY FROM  HOME Order entres, potatoes, and vegetables without sauces or butter. When meat exceeds the size of a deck of cards (3 to 4 ounces), the rest can be taken home for another meal. Choose vegetable or fruit salads and ask for low-calorie salad dressings to be served on the side. Use dressings sparingly. Limit high-fat toppings, such as bacon, crumbled eggs, cheese, sunflower seeds, and olives. Ask for heart-healthy tub margarine instead of butter.  High-Fiber Diet A high-fiber diet changes your normal diet to include more whole grains, legumes, fruits, and vegetables. Changes in the diet involve replacing refined carbohydrates with unrefined foods. The calorie level of the diet is essentially unchanged. The Dietary Reference Intake (recommended amount) for adult males is 38 grams per day. For adult females, it is 25 grams per day. Pregnant and lactating women should consume 28 grams of fiber per day. Fiber is the intact part of a plant that is not broken down during digestion. Functional fiber is fiber that has been isolated from the plant to provide a beneficial effect in the body. PURPOSE  Increase stool bulk.   Ease and regulate bowel movements.   Lower cholesterol.  INDICATIONS THAT YOU NEED MORE FIBER  Constipation and hemorrhoids.   Uncomplicated diverticulosis (intestine condition) and irritable bowel  syndrome.   Weight management.   As a protective measure against hardening of the arteries (atherosclerosis), diabetes, and cancer.   GUIDELINES FOR INCREASING FIBER IN THE DIET  Start adding fiber to the diet slowly. A gradual increase of about 5 more grams (2 slices of whole-wheat bread, 2 servings of most fruits or vegetables, or 1 bowl of high-fiber cereal) per day is best. Too rapid an increase in fiber may result in constipation, flatulence, and bloating.   Drink enough water and fluids to keep your urine clear or pale yellow. Water, juice, or caffeine-free drinks are recommended. Not drinking enough fluid may cause constipation.   Eat a variety of high-fiber foods rather than one type of fiber.   Try to increase your intake of fiber through using high-fiber foods rather than fiber pills or supplements that contain small amounts of fiber.   The goal is to change the types of food eaten. Do not supplement your present diet with high-fiber foods, but replace foods in your present diet.  INCLUDE A VARIETY OF FIBER SOURCES  Replace refined and processed grains with whole grains, canned fruits with fresh fruits, and incorporate other fiber sources. White rice, white breads, and most bakery goods contain little or no fiber.   Brown whole-grain rice, buckwheat oats, and many fruits and vegetables are all good sources of fiber. These include: broccoli, Brussels sprouts, cabbage, cauliflower, beets, sweet potatoes, white potatoes (skin on), carrots, tomatoes, eggplant, squash, berries, fresh fruits, and dried fruits.   Cereals appear to be the richest source of fiber. Cereal fiber is found in whole grains and bran. Bran is the fiber-rich outer coat of cereal grain, which is largely removed in refining. In whole-grain cereals, the bran remains. In breakfast cereals, the largest amount of fiber is found in those with "bran" in their names. The fiber content is sometimes indicated on the label.    You may need to include additional fruits and vegetables each day.   In baking, for 1 cup white flour, you may use the following substitutions:   1 cup whole-wheat flour minus 2 tablespoons.   1/2 cup white flour plus 1/2 cup whole-wheat flour.    Gastritis  Gastritis is an  inflammation (the body's way of reacting to injury and/or infection) of the stomach. It is often caused by viral or bacterial (germ) infections. It can also be caused BY ASPIRIN, BC/GOODY POWDER'S, (IBUPROFEN) MOTRIN, OR ALEVE (NAPROXEN), chemicals (including alcohol), SPICY FOODS, and medications. This illness may be associated with generalized malaise (feeling tired, not well), UPPER ABDOMINAL STOMACH cramps, and fever. One common bacterial cause of gastritis is an organism known as H. Pylori. This can be treated with antibiotics.    Polyps, Colon  A polyp is extra tissue that grows inside your body. Colon polyps grow in the large intestine. The large intestine, also called the colon, is part of your digestive system. It is a long, hollow tube at the end of your digestive tract where your body makes and stores stool. Most polyps are not dangerous. They are benign. This means they are not cancerous. But over time, some types of polyps can turn into cancer. Polyps that are smaller than a pea are usually not harmful. But larger polyps could someday become or may already be cancerous. To be safe, doctors remove all polyps and test them.   WHO GETS POLYPS? Anyone can get polyps, but certain people are more likely than others. You may have a greater chance of getting polyps if:  You are over 50.   You have had polyps before.   Someone in your family has had polyps.   Someone in your family has had cancer of the large intestine.   Find out if someone in your family has had polyps. You may also be more likely to get polyps if you:   Eat a lot of fatty foods   Smoke   Drink alcohol   Do not exercise  Eat too much    TREATMENT  The caregiver will remove the polyp during sigmoidoscopy or colonoscopy.  PREVENTION There is not one sure way to prevent polyps. You might be able to lower your risk of getting them if you:  Eat more fruits and vegetables and less fatty food.   Do not smoke.   Avoid alcohol.   Exercise every day.   Lose weight if you are overweight.   Eating more calcium and folate can also lower your risk of getting polyps. Some foods that are rich in calcium are milk, cheese, and broccoli. Some foods that are rich in folate are chickpeas, kidney beans, and spinach.    Hemorrhoids Hemorrhoids are dilated (enlarged) veins around the rectum. Sometimes clots will form in the veins. This makes them swollen and painful. These are called thrombosed hemorrhoids. Causes of hemorrhoids include:  Constipation.   Straining to have a bowel movement.   HEAVY LIFTING HOME CARE INSTRUCTIONS  Eat a well balanced diet and drink 6 to 8 glasses of water every day to avoid constipation. You may also use a bulk laxative.   Avoid straining to have bowel movements.   Keep anal area dry and clean.   Do not use a donut shaped pillow or sit on the toilet for long periods. This increases blood pooling and pain.   Move your bowels when your body has the urge; this will require less straining and will decrease pain and pressure.

## 2014-01-24 ENCOUNTER — Encounter (HOSPITAL_COMMUNITY): Payer: Self-pay | Admitting: Gastroenterology

## 2014-02-07 ENCOUNTER — Telehealth: Payer: Self-pay | Admitting: Gastroenterology

## 2014-02-07 NOTE — Telephone Encounter (Addendum)
Please call pt. His stomach Bx shows gastritis. HE had a polypoid lesion removed and it was benign.     CONTINUE YOUR WEIGHT LOSS EFFORTS. LOSE 10 LBS.  CONTINUE NEXIUM. TAKE 30 MINUTES BEFORE BREAKFAST.  FOLLOW A HIGH FIBER/LOW FAT DIET.   FOLLOW UP IN 4 MOS E30 BRBPR/DYSPEPSIA.  Next colonoscopy in 5 years WITH AN OVER TUBE AND CLEAR LIQUIDS AFTER BREAKFAST.

## 2014-02-12 NOTE — Telephone Encounter (Signed)
Reminder in epic °

## 2014-02-18 NOTE — Telephone Encounter (Signed)
Called and informed pt.  

## 2014-05-20 ENCOUNTER — Ambulatory Visit: Payer: Self-pay | Admitting: Gastroenterology

## 2014-05-30 ENCOUNTER — Emergency Department (HOSPITAL_COMMUNITY)
Admission: EM | Admit: 2014-05-30 | Discharge: 2014-05-30 | Disposition: A | Discharge status: Home / Self Care | Payer: BC Managed Care – PPO | Attending: Emergency Medicine | Admitting: Emergency Medicine

## 2014-05-30 ENCOUNTER — Encounter (HOSPITAL_COMMUNITY): Payer: Self-pay | Admitting: Emergency Medicine

## 2014-05-30 DIAGNOSIS — K222 Esophageal obstruction: Secondary | ICD-10-CM | POA: Insufficient documentation

## 2014-05-30 DIAGNOSIS — I10 Essential (primary) hypertension: Secondary | ICD-10-CM | POA: Insufficient documentation

## 2014-05-30 DIAGNOSIS — Z7982 Long term (current) use of aspirin: Secondary | ICD-10-CM | POA: Insufficient documentation

## 2014-05-30 DIAGNOSIS — R079 Chest pain, unspecified: Secondary | ICD-10-CM | POA: Insufficient documentation

## 2014-05-30 DIAGNOSIS — Z79899 Other long term (current) drug therapy: Secondary | ICD-10-CM | POA: Insufficient documentation

## 2014-05-30 DIAGNOSIS — Z8639 Personal history of other endocrine, nutritional and metabolic disease: Secondary | ICD-10-CM | POA: Insufficient documentation

## 2014-05-30 DIAGNOSIS — T18128A Food in esophagus causing other injury, initial encounter: Secondary | ICD-10-CM

## 2014-05-30 DIAGNOSIS — IMO0002 Reserved for concepts with insufficient information to code with codable children: Secondary | ICD-10-CM | POA: Insufficient documentation

## 2014-05-30 DIAGNOSIS — Z8659 Personal history of other mental and behavioral disorders: Secondary | ICD-10-CM | POA: Insufficient documentation

## 2014-05-30 DIAGNOSIS — F17200 Nicotine dependence, unspecified, uncomplicated: Secondary | ICD-10-CM | POA: Insufficient documentation

## 2014-05-30 DIAGNOSIS — Z862 Personal history of diseases of the blood and blood-forming organs and certain disorders involving the immune mechanism: Secondary | ICD-10-CM | POA: Insufficient documentation

## 2014-05-30 MED ORDER — GLUCAGON HCL RDNA (DIAGNOSTIC) 1 MG IJ SOLR
1.0000 mg | Freq: Once | INTRAMUSCULAR | Status: AC
  Administered 2014-05-30: 1 mg via INTRAVENOUS
  Filled 2014-05-30: qty 1

## 2014-05-30 MED ORDER — ONDANSETRON HCL 4 MG/2ML IJ SOLN
4.0000 mg | Freq: Once | INTRAMUSCULAR | Status: AC
  Administered 2014-05-30: 4 mg via INTRAVENOUS
  Filled 2014-05-30: qty 2

## 2014-05-30 MED ORDER — SODIUM CHLORIDE 0.9 % IV BOLUS (SEPSIS)
1000.0000 mL | Freq: Once | INTRAVENOUS | Status: AC
  Administered 2014-05-30: 1000 mL via INTRAVENOUS

## 2014-05-30 NOTE — ED Notes (Signed)
PO challenge successful. No vomiting or spitting.

## 2014-05-30 NOTE — ED Notes (Signed)
States he feels better. Given sprite for PO challenge.

## 2014-05-30 NOTE — ED Notes (Signed)
Discharge instructions reviewed with pt, questions answered. Pt verbalized understanding.  

## 2014-05-30 NOTE — ED Provider Notes (Signed)
This chart was scribed for [place], DO by Marti Sleigh, ED Scribe. This patient was seen in room APA02/APA02 and the patient's care was started at 10:11 AM.   TIME SEEN: 10:11 AM  CHIEF COMPLAINT: Chest pain  HPI: HPI Comments: Mathew Lara is a 53 y.o. male with history of hypertension, hyperlipidemia who presents to the Emergency Department complaining of constant chest pain that started at 3:30 AM this morning after the pt ate a chicken sandwich and had difficulty swallowing. The pt has experienced these symptoms in the past, but was able to pass the food. He reports he feels like something is "stuck" in his throat. No diaphoresis or dizziness. He denies vomiting and difficulty breathing as associated symptoms. He has an endoscopy 5 months ago performed by Dr. Oneida Alar. No history of cardiac disease. Pain is not exertional or pleuritic. He is unable to swallow his own saliva.  ROS: See HPI Constitutional: no fever  Eyes: no drainage  ENT: no runny nose   Cardiovascular:  no chest pain  Resp: no SOB  GI: no vomiting GU: no dysuria Integumentary: no rash  Allergy: no hives  Musculoskeletal: no leg swelling  Neurological: no slurred speech ROS otherwise negative  PAST MEDICAL HISTORY/PAST SURGICAL HISTORY:  Past Medical History  Diagnosis Date  . Anxiety   . Hypertension   . Bronchitis   . Mixed hyperlipidemia   . Alcohol abuse     MEDICATIONS:  Prior to Admission medications   Medication Sig Start Date End Date Taking? Authorizing Provider  amLODipine (NORVASC) 10 MG tablet Take 10 mg by mouth daily.   Yes Historical Provider, MD  aspirin EC 81 MG tablet Take 81 mg by mouth daily.   Yes Historical Provider, MD  hydrocortisone (ANUSOL-HC) 2.5 % rectal cream Apply rectally 2 times daily 01/02/14   Tammy L. Triplett, PA-C  losartan (COZAAR) 50 MG tablet Take 50 mg by mouth daily.    Historical Provider, MD    ALLERGIES:  No Known Allergies  SOCIAL HISTORY:  History   Substance Use Topics  . Smoking status: Current Every Day Smoker    Types: Cigarettes  . Smokeless tobacco: Not on file  . Alcohol Use: Yes     Comment: Reports a few beers on the weekends at this point    FAMILY HISTORY: Family History  Problem Relation Age of Onset  . Cancer Mother     EXAM: BP 152/103  Pulse 103  Temp(Src) 98.2 F (36.8 C) (Oral)  Resp 22  Ht 5\' 8"  (1.727 m)  Wt 255 lb (115.667 kg)  BMI 38.78 kg/m2  SpO2 96% CONSTITUTIONAL: Alert and oriented and responds appropriately to questions. Well-appearing; well-nourished HEAD: Normocephalic EYES: Conjunctivae clear, PERRL ENT: normal nose; no rhinorrhea; moist mucous membranes; pharynx without lesions noted; unable to swallow own secretions; repeatedly spitting, no trismus or drooling, no stridor, no respiratory distress, airway patent NECK: Supple, no meningismus, no LAD  CARD: RRR; S1 and S2 appreciated; no murmurs, no clicks, no rubs, no gallops RESP: Normal chest excursion without splinting or tachypnea; breath sounds clear and equal bilaterally; no wheezes, no rhonchi, no rales,  ABD/GI: Normal bowel sounds; non-distended; soft, non-tender, no rebound, no guarding BACK:  The back appears normal and is non-tender to palpation, there is no CVA tenderness EXT: Normal ROM in all joints; non-tender to palpation; no edema; normal capillary refill; no cyanosis    SKIN: Normal color for age and race; warm NEURO: Moves all extremities equally  PSYCH: The patient's mood and manner are appropriate. Grooming and personal hygiene are appropriate.   EKG Interpretation  Date/Time:  Thursday May 30 2014 10:26:42 EDT Ventricular Rate:  87 PR Interval:  190 QRS Duration: 77 QT Interval:  351 QTC Calculation: 422 R Axis:   44 Text Interpretation:  Sinus rhythm Borderline T wave abnormalities ST elevation  Confirmed by Rhett Mutschler,  DO, Kyndell Zeiser [id number]) on 05/30/2014 10:52:58 AM        MEDICAL DECISION MAKING: Patient  here with likely an esophageal food bolus. Doubt ACS, pulmonary embolus. We'll give IV fluids, glucagon and Zofran and reassess.  ED PROGRESS: Patient reports feeling much better after IV medications. He is now able to swallow his own secretions and he did drink without difficulty. He reports his pain is now completely gone. He has an outpatient appointment are scheduled with gastroenterology. Have discussed strict return precautions and supportive care instructions. He verbalizes understanding and is comfortable with plan.      I personally performed the services described in this documentation, which was scribed in my presence. The recorded information has been reviewed and is accurate.    [place], DO 05/30/14 1531

## 2014-05-30 NOTE — Discharge Instructions (Signed)
Possible Esophageal Stricture The esophagus is the long, narrow tube which carries food and liquid from the mouth to the stomach. Sometimes a part of the esophagus becomes narrow and makes it difficult, painful, or even impossible to swallow. This is called an esophageal stricture.  CAUSES  Common causes of blockage or strictures of the esophagus are:  Exposure of the lower esophagus to the acid from the stomach may cause narrowing.  Hiatal hernia in which a small part of the stomach bulges up through the diaphragm can cause a narrowing in the bottom of the esophagus.  Scleroderma is a tissue disorder that affects the esophagus and makes swallowing difficult.  Achalasia is an absence of nerves in the lower esophagus and to the esophageal sphincter. This absence of nerves may be congenital (present since birth). This can cause irregular spasms which do not allow food and fluid through.  Strictures may develop from swallowing materials which damage the esophagus. Examples are acids or alkalis such as lye.  Schatzki's Ring is a narrow ring of non-cancerous tissue which narrows the lower esophagus. The cause of this is unknown.  Growths can block the esophagus. SYMPTOMS  Some of the problems are difficulty swallowing or pain with swallowing. DIAGNOSIS  Your caregiver often suspects this problem by taking a medical history. They will also do a physical exam. They may then take X-rays and/or perform an endoscopy. Endoscopy is an exam in which a tube like a small flexible telescope is used to look at your esophagus.  TREATMENT  One form of treatment is to dilate the narrow area. This means to stretch it.  When this is not successful, chest surgery may be required. This is a much more extensive form of treatment with a longer recovery time. Both of the above treatments make the passage of food and water into the stomach easier. They also make it easier for stomach contents to bubble back into the  esophagus. Special medications may be used following the procedure to help prevent further narrowing. Medications may be used to lower the amount of acid in the stomach juice.  SEEK IMMEDIATE MEDICAL CARE IF:   Your swallowing is becoming more painful, difficult, or you are unable to swallow.  You vomit up blood.  You develop black tarry stools.  You develop chills.  You have a fever.  You develop chest or abdominal pain.  You develop shortness of breath, feel lightheaded, or faint. Follow up with medical care as your caregiver suggests. Document Released: 05/03/2006 Document Revised: 11/15/2011 Document Reviewed: 01/02/2014 [facility] Patient Information 2015 [place], [place]. This information is not intended to replace advice given to you by your health care provider. Make sure you discuss any questions you have with your health care provider.

## 2014-05-30 NOTE — ED Notes (Signed)
Patient ambulated to room, steady gait. Respirations even and unlabored. Speaking in full sentences.

## 2014-05-30 NOTE — ED Notes (Signed)
MD at bedside. 

## 2014-05-30 NOTE — ED Notes (Signed)
Was eating a chicken sandwich at 330am.  States part of chicken became lodged in his throat.  "He can feel it stuck in chest "  Unable to swallow or eat anything at this time.  States it comes back up.

## 2014-06-27 ENCOUNTER — Encounter: Payer: Self-pay | Admitting: Gastroenterology

## 2014-06-27 ENCOUNTER — Ambulatory Visit: Payer: BC Managed Care – PPO | Admitting: Gastroenterology

## 2014-10-01 ENCOUNTER — Ambulatory Visit (INDEPENDENT_AMBULATORY_CARE_PROVIDER_SITE_OTHER): Payer: BLUE CROSS/BLUE SHIELD | Admitting: Neurology

## 2014-10-01 ENCOUNTER — Encounter: Payer: Self-pay | Admitting: Neurology

## 2014-10-01 VITALS — BP 129/83 | HR 87 | Temp 98.5°F | Resp 18 | Ht 68.0 in | Wt 251.0 lb

## 2014-10-01 DIAGNOSIS — G4719 Other hypersomnia: Secondary | ICD-10-CM

## 2014-10-01 DIAGNOSIS — R351 Nocturia: Secondary | ICD-10-CM

## 2014-10-01 DIAGNOSIS — G4761 Periodic limb movement disorder: Secondary | ICD-10-CM

## 2014-10-01 DIAGNOSIS — G2581 Restless legs syndrome: Secondary | ICD-10-CM

## 2014-10-01 DIAGNOSIS — G4733 Obstructive sleep apnea (adult) (pediatric): Secondary | ICD-10-CM

## 2014-10-01 DIAGNOSIS — G4726 Circadian rhythm sleep disorder, shift work type: Secondary | ICD-10-CM

## 2014-10-01 NOTE — Progress Notes (Signed)
Subjective:    Patient ID: Mathew Lara is a 54 y.o. male.  HPI     Star Age, MD, PhD [facility] [address] [place], [place] [address]   Dear Dr. Ethlyn Gallery,   I saw your patient, Kasaun Barbosa, upon your kind request in my neurologic clinic today for initial consultation of his sleep disorder, in particular, concern for underlying obstructive sleep apnea. The patient is unaccompanied today. As you know, Mr. Bitz is a 54 year old right-handed gentleman with an underlying medical history of chronic kidney disease, hypertension, smoking, and obesity, who is reported to snore and have breathing pauses while asleep per wife. He also wakes up gasping for air and has multiple awakenings because he has to go to the bathroom. He has woken himself up with a gasping sensation and is reporting excessive sleepiness with an Epworth of 20 out of 24. 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 He comes home around 8:40 and usually eats breakfast and then goes to bed around 10 or 10:30 AM. He falls asleep quickly. He does wake up multiple times for extended periods of time during which h will eat something and then go to the bathroom and then go back to sleep. He does this off and on throughout the day until it is time to get ready for work. He lives with his wife and currently 2 foster children. He does not drink alcohol except for very rarely. In the past he has had a couple of years where he e drink heavily. He drinks 2 cups of coffee at night during work hours. He tries to drink enough water. He is currently on an antibiotic, for upper respiratory infection. He has not fallen asleep while driving. When he has an appointment during the day his wife usually drives him. He has 2 sisters with obstructive sleep apnea both on CPAP machines. He would be willing to use CPAP if the need arises. He has  never had a sleep study until this referral. He goes to the bathroom 3 or 4 times in the middle during his sleep schedule. He has a TV in the bedroom but rarely watches it. He has some restless leg type symptoms and is known to twitch in his sleep. He is a very restless sleeper according to his wife. He quit smoking but sometimes "slips". He denies cataplexy, sleep paralysis, hypnagogic or hypnopompic hallucinations, or sleep attacks. He does not report any vivid dreams, nightmares, dream enactments, or parasomnias, such as sleep talking or sleep walking.   His Past Medical History Is Significant For: Past Medical History  Diagnosis Date  . Anxiety   . Hypertension   . Bronchitis   . Mixed hyperlipidemia   . Alcohol abuse     His Past Surgical History Is Significant For: Past Surgical History  Procedure Laterality Date  . Colonoscopy N/A 01/23/2014    SLF:The LEFT COLON IS EXTREMELY redundant/TWO RECTAL POLYPS REMOVED/SMALL VOLUME RECTAL BLEEDING MOST LIKELY DUE TO Small internal hemorrhoids  . Esophagogastroduodenoscopy N/A 01/23/2014    [id number] ring at the gastroesophageal junction/Medium sized hiatal hernia/NDYSPEPSIA MOST LIKELY DUE TO GERD/MILD Non-erosive gastritis  . Hemorrhoid banding  01/23/2014    Procedure: HEMORRHOID BANDING;  Surgeon: Danie Binder, MD;  [facility];  Service: Endoscopy;;    His Family History Is Significant For: Family History  Problem Relation Age of Onset  .  Cancer Mother   . Sleep apnea Sister   . Heart attack Brother     His Social History Is Significant For: History   Social History  . Marital Status: Married    Spouse Name: N/A    Number of Children: 1  . Years of Education: 10   Occupational History  .      [employer]   Social History Main Topics  . Smoking status: Current Every Day Smoker    Types: Cigarettes  . Smokeless tobacco: Never Used  . Alcohol Use: 0.0 oz/week    0 Not specified per week     Comment:  Reports a few beers on the weekends at this point  . Drug Use: No  . Sexual Activity: None   Other Topics Concern  . None   Social History Narrative       patient consumes 4 cups of caffeine daily.    His Allergies Are:  No Known Allergies:   His Current Medications Are:  Outpatient Encounter Prescriptions as of 10/01/2014  Medication Sig  . albuterol (PROVENTIL HFA;VENTOLIN HFA) 108 (90 BASE) MCG/ACT inhaler Inhale 2 puffs into the lungs 3 (three) times daily.  Marland Kitchen AMLODIPINE BESYLATE PO Take 10 mg by mouth daily.  Marland Kitchen amoxicillin-clavulanate (AUGMENTIN) 875-125 MG per tablet   . aspirin EC 81 MG tablet Take 81 mg by mouth daily.  . chlorpheniramine-HYDROcodone (TUSSIONEX) 10-8 MG/5ML LQCR   . losartan (COZAAR) 50 MG tablet Take 50 mg by mouth daily.  . predniSONE (DELTASONE) 10 MG tablet   . [DISCONTINUED] azithromycin (ZITHROMAX) 250 MG tablet   :  Review of Systems:  Out of a complete 14 point review of systems, all are reviewed and negative with the exception of these symptoms as listed below:   Review of Systems  Respiratory: Positive for choking and wheezing.   Neurological:       Snoring    Objective:  Neurologic Exam  Physical Exam Physical Examination:   Filed Vitals:   10/01/14 1004  BP: 129/83  Pulse: 87  Temp: 98.5 F (36.9 C)  Resp: 18    General Examination: The patient is a very pleasant 54 y.o. male in no acute distress. He appears well-developed and well-nourished and adequately groomed.   HEENT: Normocephalic, atraumatic, pupils are equal, round and reactive to light and accommodation. Funduscopic exam is normal with sharp disc margins noted. Extraocular tracking is good without limitation to gaze excursion or nystagmus noted. Normal smooth pursuit is noted. Hearing is grossly intact. Tympanic membranes are clear bilaterally. Face is symmetric with normal facial animation and normal facial sensation. Speech is clear with no dysarthria noted. There  is no hypophonia. There is no lip, neck/head, jaw or voice tremor. Neck is supple with full range of passive and active motion. There are no carotid bruits on auscultation. Oropharynx exam reveals: mild mouth dryness, adequate dental hygiene. He has full dentures on top and several missing teeth on the bottom with tiny underbite. He has a neck size of 18 inches. Mallampati is class III. Tongue protrudes centrally and palate elevates symmetrically. Tonsils are not visualized due large tongue. He has a tight airway d/large tongue, thick soft palate, narrow airway entry. Nasal inspection reveals no significant nasal mucosal bogginess or redness and no septal deviation. There is mild pharyngeal erythema and he has a couple of bouts of cough.   Chest: Clear to auscultation without wheezing, rhonchi or crackles noted.  Heart: S1+S2+0, regular and normal without murmurs,  rubs or gallops noted.   Abdomen: Soft, non-tender and non-distended with normal bowel sounds appreciated on auscultation.  Extremities: There is trace pitting edema in the distal lower extremities bilaterally. Pedal pulses are intact.  Skin: Warm and dry without trophic changes noted. There are no varicose veins.  Musculoskeletal: exam reveals no obvious joint deformities, tenderness or joint swelling or erythema.   Neurologically:  Mental status: The patient is awake, alert and oriented in all 4 spheres. His immediate and remote memory, attention, language skills and fund of knowledge are appropriate. There is no evidence of aphasia, agnosia, apraxia or anomia. Speech is clear with normal prosody and enunciation, but he sounds congested. Thought process is linear. Mood is normal and affect is normal.  Cranial nerves II - XII are as described above under HEENT exam. In addition: shoulder shrug is normal with equal shoulder height noted. Motor exam: Normal bulk, strength and tone is noted. There is no drift, tremor or rebound. Romberg is  negative. Reflexes are 2+ throughout. Babinski: Toes are flexor bilaterally. Fine motor skills and coordination: intact with normal finger taps, normal hand movements, normal rapid alternating patting, normal foot taps and normal foot agility.  Cerebellar testing: No dysmetria or intention tremor on finger to nose testing. Heel to shin is unremarkable bilaterally. There is no truncal or gait ataxia.  Sensory exam: intact to light touch, pinprick, vibration, temperature sense in the upper and lower extremities.  Gait, station and balance: He stands easily. No veering to one side is noted. No leaning to one side is noted. Posture is age-appropriate and stance is narrow based. Gait shows normal stride length and normal pace. No problems turning are noted. He turns en bloc. Tandem walk is unremarkable.   Assessment and Plan:  In summary, Sael Matura is a very pleasant 54 y.o.-year old male with an underlying medical history of chronic kidney disease, hypertension, smoking, and obesity, whose history and physical exam are in keeping with obstructive sleep apnea (OSA).  I had a long chat with the patient and his wife about my findings and the diagnosis of OSA, its prognosis and treatment options. We talked about medical treatments, surgical interventions and non-pharmacological approaches. I explained in particular the risks and ramifications of untreated moderate to severe OSA, especially with respect to developing cardiovascular disease down the Road, including congestive heart failure, difficult to treat hypertension, cardiac arrhythmias, or stroke. Even type 2 diabetes has, in part, been linked to untreated OSA. Symptoms of untreated OSA include daytime sleepiness, memory problems, mood irritability and mood disorder such as depression and anxiety, lack of energy, as well as recurrent headaches, especially morning headaches. We talked about smoking cessation and trying to maintain a healthy lifestyle in  general, as well as the importance of weight control. I encouraged the patient to eat healthy, exercise daily and keep well hydrated, to keep a scheduled bedtime and wake time routine, to not skip any meals and eat healthy snacks in between meals. I advised the patient not to drive when feeling sleepy. I recommended the following at this time: sleep study with potential positive airway pressure titration. (We will score hypopneas at 3% and split the sleep study into diagnostic and treatment portion, if the estimated. 2 hour AHI is >15/h).  Of note, we will wait until he has finished his antibiotic course and feels better to have a more fruitful outcome of his sleep study.  I explained the sleep test procedure to the patient and also outlined  possible surgical and non-surgical treatment options of OSA, including the use of a custom-made dental device (which would require a referral to a specialist dentist or oral surgeon), upper airway surgical options, such as pillar implants, radiofrequency surgery, tongue base surgery, and UPPP (which would involve a referral to an ENT surgeon). Rarely, jaw surgery such as mandibular advancement may be considered.  I also explained the CPAP treatment option to the patient, who indicated that he would be willing to try CPAP if the need arises. I explained the importance of being compliant with PAP treatment, not only for insurance purposes but primarily to improve His symptoms, and for the patient's long term health benefit, including to reduce His cardiovascular risks. I answered all their questions today and the patient and his wife were in agreement. I would like to see him back after the sleep study is completed and encouraged him to call with any interim questions, concerns, problems or updates.   Thank you very much for allowing me to participate in the care of this nice patient. If I can be of any further assistance to you please do not hesitate to call me at  [phone].  Sincerely,   Star Age, MD, PhD

## 2014-10-01 NOTE — Patient Instructions (Signed)

## 2014-10-10 ENCOUNTER — Encounter: Payer: Self-pay | Admitting: Neurology

## 2014-10-30 ENCOUNTER — Ambulatory Visit (INDEPENDENT_AMBULATORY_CARE_PROVIDER_SITE_OTHER): Payer: BLUE CROSS/BLUE SHIELD | Admitting: Neurology

## 2014-10-30 DIAGNOSIS — G4710 Hypersomnia, unspecified: Secondary | ICD-10-CM

## 2014-10-30 DIAGNOSIS — G4733 Obstructive sleep apnea (adult) (pediatric): Secondary | ICD-10-CM

## 2014-10-30 DIAGNOSIS — G4734 Idiopathic sleep related nonobstructive alveolar hypoventilation: Secondary | ICD-10-CM

## 2014-10-30 DIAGNOSIS — G4761 Periodic limb movement disorder: Secondary | ICD-10-CM

## 2014-10-30 DIAGNOSIS — R9431 Abnormal electrocardiogram [ECG] [EKG]: Secondary | ICD-10-CM

## 2014-10-30 DIAGNOSIS — G4730 Sleep apnea, unspecified: Secondary | ICD-10-CM

## 2014-10-30 DIAGNOSIS — G479 Sleep disorder, unspecified: Secondary | ICD-10-CM

## 2014-10-30 NOTE — Sleep Study (Signed)
Please see the scanned sleep study interpretation located in the Media tab within the Chart Review section. °

## 2014-11-01 ENCOUNTER — Telehealth: Payer: Self-pay | Admitting: Neurology

## 2014-11-01 DIAGNOSIS — G4710 Hypersomnia, unspecified: Secondary | ICD-10-CM

## 2014-11-01 DIAGNOSIS — G4734 Idiopathic sleep related nonobstructive alveolar hypoventilation: Secondary | ICD-10-CM

## 2014-11-01 DIAGNOSIS — G4730 Sleep apnea, unspecified: Secondary | ICD-10-CM

## 2014-11-01 DIAGNOSIS — G4733 Obstructive sleep apnea (adult) (pediatric): Secondary | ICD-10-CM

## 2014-11-01 DIAGNOSIS — G4761 Periodic limb movement disorder: Secondary | ICD-10-CM

## 2014-11-01 NOTE — Telephone Encounter (Signed)
Please call and notify patient that the recent sleep study confirmed the diagnosis of OSA. He did well with BiPAP during the study with significant improvement of the respiratory events, but needed supplemental oxygen too to maintain better oxygen saturations. We will start the patient on BiPAP with O2 at home. I placed the order in the chart.   Arrange for CPAP set up at home through a DME company of patient's choice and fax/route report to PCP and referring MD (if other than PCP).   The patient will also need a follow up appointment with me in 6-8 weeks post set up that has to be scheduled; help the patient schedule this (in a follow-up slot).   Please re-enforce the importance of compliance with treatment and the need for [place] to monitor compliance data.   Once you have spoken to the patient and scheduled the return appointment, you may close this encounter, thanks,   Star Age, MD, PhD [facility] ([place])

## 2014-11-04 NOTE — Telephone Encounter (Signed)
Mathew Lara the patient contacted office to check on his sleep study results I verbalized the below information and he has asked that you send his referral to a local DME within his address. He also verbalized that we send his sleep results to his mailing address. I told him I would make you aware of this information, and you would follow up with him on the DME information.

## 2014-11-05 ENCOUNTER — Encounter: Payer: Self-pay | Admitting: *Deleted

## 2014-11-05 ENCOUNTER — Encounter: Payer: Self-pay | Admitting: Neurology

## 2014-11-05 NOTE — Telephone Encounter (Signed)
Patient was contacted and left a brief message stating his referral for CPAP was sent to [facility] for processing.  Dr. Micheline Rough was sent a copy of the results and the patient was mailed a letter along with his test results.   Patient instructed to contact our office 6-8 weeks post set up to schedule a follow up appointment.

## 2014-12-02 ENCOUNTER — Other Ambulatory Visit (HOSPITAL_COMMUNITY): Payer: Self-pay | Admitting: Nephrology

## 2014-12-02 DIAGNOSIS — N289 Disorder of kidney and ureter, unspecified: Secondary | ICD-10-CM

## 2014-12-06 ENCOUNTER — Ambulatory Visit (HOSPITAL_COMMUNITY)
Admission: RE | Admit: 2014-12-06 | Discharge: 2014-12-06 | Disposition: A | Discharge status: Home / Self Care | Payer: BLUE CROSS/BLUE SHIELD | Source: Ambulatory Visit | Attending: Nephrology | Admitting: Nephrology

## 2014-12-06 DIAGNOSIS — N289 Disorder of kidney and ureter, unspecified: Secondary | ICD-10-CM | POA: Diagnosis not present

## 2015-01-01 ENCOUNTER — Encounter: Payer: Self-pay | Admitting: Neurology

## 2015-02-27 ENCOUNTER — Encounter (INDEPENDENT_AMBULATORY_CARE_PROVIDER_SITE_OTHER): Payer: Self-pay | Admitting: *Deleted

## 2015-03-21 ENCOUNTER — Ambulatory Visit (INDEPENDENT_AMBULATORY_CARE_PROVIDER_SITE_OTHER): Payer: BLUE CROSS/BLUE SHIELD | Admitting: Internal Medicine

## 2015-03-21 ENCOUNTER — Encounter (INDEPENDENT_AMBULATORY_CARE_PROVIDER_SITE_OTHER): Payer: Self-pay | Admitting: Internal Medicine

## 2015-03-21 ENCOUNTER — Encounter (INDEPENDENT_AMBULATORY_CARE_PROVIDER_SITE_OTHER): Payer: Self-pay

## 2015-03-21 ENCOUNTER — Encounter (INDEPENDENT_AMBULATORY_CARE_PROVIDER_SITE_OTHER): Payer: Self-pay | Admitting: *Deleted

## 2015-03-21 VITALS — BP 94/72 | HR 72 | Temp 97.6°F | Ht 68.0 in | Wt 245.2 lb

## 2015-03-21 DIAGNOSIS — B1920 Unspecified viral hepatitis C without hepatic coma: Secondary | ICD-10-CM

## 2015-03-21 DIAGNOSIS — B1921 Unspecified viral hepatitis C with hepatic coma: Secondary | ICD-10-CM

## 2015-03-21 LAB — TSH: TSH: 0.448 u[IU]/mL (ref 0.350–4.500)

## 2015-03-21 LAB — PROTIME-INR
INR: 1 (ref <1.50)
PROTHROMBIN TIME: 13.2 s (ref 11.6–15.2)

## 2015-03-21 NOTE — Progress Notes (Addendum)
   Subjective:    Patient ID: Mathew Lara, male    DOB: 02/11/1961, 54 y.o.   MRN: [id number]  HPI Referred to our office for tx of Hepatitis C. He is genotype 1B.  Referred by Dr. Ethlyn Gallery at [facility]. He says he saw Dr. Hinda Lenis and he was positive for Hepatitis C. He has a hx of IV drugs years ago.  Appetite is good. No weight loss. No GERD. No abdominal pain. Usually has a BM daily. No melena or BRRB.   Received records from Dr. Hinda Lenis. Patient has h of CKD stage 111 His GFR is 43 cc per minute (Per Viekira, no adjustment in medication)   02/19/2015: H and H 14.5 and 42.1, MCV 88, Creatinine 1.63, BUN 17, K 4.1, Albumin 4.4, Protein High, ALP 156, AST 35, ALT 26. Genotype 1B 12/30/2014 HBsAg screen negative    Have you ever been treated for Hepatitis C? no Any hx of IV drug abuse or drug abuse? no Are you drinking now? occasionally Any hx of etoh abuse?  Yes. Quit drinking 2014 Do you have tattoos? no Have you ever received a blood transfusion? no When were you diagnosed with Hepatitis C? A couple of months ago Any hx of mental illness requiring treatment? No Do you have suicidal thoughts? no   Review of Systems Past Medical History  Diagnosis Date  . Anxiety   . Hypertension   . Bronchitis   . Mixed hyperlipidemia   . Alcohol abuse   . Hepatitis C     Past Surgical History  Procedure Laterality Date  . Colonoscopy N/A 01/23/2014    SLF:The LEFT COLON IS EXTREMELY redundant/TWO RECTAL POLYPS REMOVED/SMALL VOLUME RECTAL BLEEDING MOST LIKELY DUE TO Small internal hemorrhoids  . Esophagogastroduodenoscopy N/A 01/23/2014    [id number] ring at the gastroesophageal junction/Medium sized hiatal hernia/NDYSPEPSIA MOST LIKELY DUE TO GERD/MILD Non-erosive gastritis  . Hemorrhoid banding  01/23/2014    Procedure: HEMORRHOID BANDING;  Surgeon: Danie Binder, MD;  Location[facility];  Service: Endoscopy;;    No Known Allergies  Current Outpatient Prescriptions on  File Prior to Visit  Medication Sig Dispense Refill  . AMLODIPINE BESYLATE PO Take 10 mg by mouth daily.    Marland Kitchen aspirin EC 81 MG tablet Take 81 mg by mouth daily.    Marland Kitchen losartan (COZAAR) 50 MG tablet Take 50 mg by mouth daily.     No current facility-administered medications on file prior to visit.        Objective:   Physical Exam Blood pressure 94/72, pulse 72, temperature 97.6 F (36.4 C), height 5\' 8"  (1.727 m), weight 245 lb 3.2 oz (111.222 kg).  Alert and oriented. Skin warm and dry. Oral mucosa is moist.   . Sclera anicteric, conjunctivae is pink. Thyroid not enlarged. No cervical lymphadenopathy. Lungs clear. Heart regular rate and rhythm.  Abdomen is soft. Bowel sounds are positive. No hepatomegaly. No abdominal masses felt. No tenderness.  No edema to lower extremities.         Assessment & Plan:  Hepatitis C. Will get labs and elastrography

## 2015-03-21 NOTE — Patient Instructions (Signed)
Hep A and B vaccine at [facility]

## 2015-03-22 LAB — DRUG SCREEN, URINE
Amphetamine Screen, Ur: NEGATIVE
Barbiturate Quant, Ur: NEGATIVE
Benzodiazepines.: NEGATIVE
Cocaine Metabolites: NEGATIVE
Creatinine,U: 230.07 mg/dL
MARIJUANA METABOLITE: NEGATIVE
METHADONE: NEGATIVE
OPIATES: NEGATIVE
Phencyclidine (PCP): NEGATIVE
Propoxyphene: NEGATIVE

## 2015-03-22 LAB — HEPATITIS C ANTIBODY: HCV Ab: REACTIVE — AB

## 2015-03-25 LAB — HEPATITIS C RNA QUANTITATIVE
HCV Quantitative Log: 4.87 {Log} — ABNORMAL HIGH (ref <1.18)
HCV Quantitative: 73909 IU/mL — ABNORMAL HIGH (ref <15)

## 2015-03-26 ENCOUNTER — Ambulatory Visit (HOSPITAL_COMMUNITY)
Admission: RE | Admit: 2015-03-26 | Discharge: 2015-03-26 | Disposition: A | Discharge status: Home / Self Care | Payer: BLUE CROSS/BLUE SHIELD | Source: Ambulatory Visit | Attending: Internal Medicine | Admitting: Internal Medicine

## 2015-03-26 DIAGNOSIS — B1920 Unspecified viral hepatitis C without hepatic coma: Secondary | ICD-10-CM | POA: Insufficient documentation

## 2015-03-27 ENCOUNTER — Telehealth (INDEPENDENT_AMBULATORY_CARE_PROVIDER_SITE_OTHER): Payer: Self-pay | Admitting: Internal Medicine

## 2015-03-27 NOTE — Telephone Encounter (Signed)
Tammy, will treat with either Viekira x 12 weeks, or [name] x 12 weeks.

## 2015-03-28 ENCOUNTER — Encounter (INDEPENDENT_AMBULATORY_CARE_PROVIDER_SITE_OTHER): Payer: Self-pay

## 2015-04-02 NOTE — Telephone Encounter (Signed)
Paperwork has been done for [name] 12 week treatment. As soon as the clinicals are released this info will be faxed to [facility] for further evaluation on evaluation. Patient will be updated as we are on the status of this medication request.

## 2015-04-14 ENCOUNTER — Telehealth (INDEPENDENT_AMBULATORY_CARE_PROVIDER_SITE_OTHER): Payer: Self-pay | Admitting: *Deleted

## 2015-04-14 DIAGNOSIS — B182 Chronic viral hepatitis C: Secondary | ICD-10-CM

## 2015-04-14 NOTE — Telephone Encounter (Addendum)
[name] received his Hep C medication on 04/12/15 and started it on 04/15/15. His 4 week f/u has been scheduled for 05/15/15 with Deberah Castle, NP.

## 2015-04-15 NOTE — Telephone Encounter (Signed)
Mathew Lara came by the office to let Terri know he is taking [name] 90mg /400mg . He has taken one this morning. [name] is going to speak with the patient

## 2015-04-15 NOTE — Telephone Encounter (Signed)
Patient's labs are notes for 05/13/15.

## 2015-04-15 NOTE — Telephone Encounter (Signed)
noted 

## 2015-04-15 NOTE — Telephone Encounter (Signed)
Noted  Tammy CBC with diff, Hepatic function, Hep C quaint in 4 weeks.

## 2015-04-29 ENCOUNTER — Other Ambulatory Visit (INDEPENDENT_AMBULATORY_CARE_PROVIDER_SITE_OTHER): Payer: Self-pay | Admitting: *Deleted

## 2015-04-29 ENCOUNTER — Encounter (INDEPENDENT_AMBULATORY_CARE_PROVIDER_SITE_OTHER): Payer: Self-pay | Admitting: *Deleted

## 2015-04-29 DIAGNOSIS — B182 Chronic viral hepatitis C: Secondary | ICD-10-CM

## 2015-05-14 LAB — HEPATIC FUNCTION PANEL
ALK PHOS: 139 U/L — AB (ref 40–115)
ALT: 12 U/L (ref 9–46)
AST: 17 U/L (ref 10–35)
Albumin: 4 g/dL (ref 3.6–5.1)
BILIRUBIN DIRECT: 0.1 mg/dL (ref <=0.2)
BILIRUBIN INDIRECT: 0.5 mg/dL (ref 0.2–1.2)
Total Bilirubin: 0.6 mg/dL (ref 0.2–1.2)
Total Protein: 8.4 g/dL — ABNORMAL HIGH (ref 6.1–8.1)

## 2015-05-14 LAB — CBC WITH DIFFERENTIAL/PLATELET
BASOS ABS: 0.1 10*3/uL (ref 0.0–0.1)
Basophils Relative: 1 % (ref 0–1)
Eosinophils Absolute: 0.8 10*3/uL — ABNORMAL HIGH (ref 0.0–0.7)
Eosinophils Relative: 10 % — ABNORMAL HIGH (ref 0–5)
HEMATOCRIT: 40.7 % (ref 39.0–52.0)
HEMOGLOBIN: 13.9 g/dL (ref 13.0–17.0)
LYMPHS ABS: 1.3 10*3/uL (ref 0.7–4.0)
LYMPHS PCT: 17 % (ref 12–46)
MCH: 29 pg (ref 26.0–34.0)
MCHC: 34.2 g/dL (ref 30.0–36.0)
MCV: 84.8 fL (ref 78.0–100.0)
MPV: 10.1 fL (ref 8.6–12.4)
Monocytes Absolute: 0.7 10*3/uL (ref 0.1–1.0)
Monocytes Relative: 9 % (ref 3–12)
NEUTROS ABS: 5 10*3/uL (ref 1.7–7.7)
Neutrophils Relative %: 63 % (ref 43–77)
Platelets: 243 10*3/uL (ref 150–400)
RBC: 4.8 MIL/uL (ref 4.22–5.81)
RDW: 14.4 % (ref 11.5–15.5)
WBC: 7.9 10*3/uL (ref 4.0–10.5)

## 2015-05-14 LAB — HEPATITIS C RNA QUANTITATIVE: HCV Quantitative: NOT DETECTED IU/mL (ref <15)

## 2015-05-15 ENCOUNTER — Encounter (INDEPENDENT_AMBULATORY_CARE_PROVIDER_SITE_OTHER): Payer: Self-pay | Admitting: Internal Medicine

## 2015-05-15 ENCOUNTER — Telehealth (INDEPENDENT_AMBULATORY_CARE_PROVIDER_SITE_OTHER): Payer: Self-pay | Admitting: *Deleted

## 2015-05-15 ENCOUNTER — Ambulatory Visit (INDEPENDENT_AMBULATORY_CARE_PROVIDER_SITE_OTHER): Payer: BLUE CROSS/BLUE SHIELD | Admitting: Internal Medicine

## 2015-05-15 VITALS — BP 118/90 | HR 64 | Temp 97.9°F | Ht 68.0 in | Wt 245.6 lb

## 2015-05-15 DIAGNOSIS — B1920 Unspecified viral hepatitis C without hepatic coma: Secondary | ICD-10-CM | POA: Diagnosis not present

## 2015-05-15 DIAGNOSIS — B182 Chronic viral hepatitis C: Secondary | ICD-10-CM

## 2015-05-15 NOTE — Progress Notes (Signed)
   Subjective:    Patient ID: Mathew Lara, male    DOB: [birth year], 54 y.o.   MRN: [id number]  HPI Here today for f/u of his Hepatitis C. Genotype 1B.  This is week 5 for him. Treated with harvoni x 12 weeks.He has cleared the virus.   05/13/2015 Hep C quaint not detected. He tells me he is doing good. Working full time.  No aches or pains. No rashes. He is not drinking. Appetite has remained good. No   03/26/2015 [place] elastrography: F0-F1 Hepatic Function Panel     Component Value Date/Time   PROT 8.4* 05/13/2015 0845   ALBUMIN 4.0 05/13/2015 0845   AST 17 05/13/2015 0845   ALT 12 05/13/2015 0845   ALKPHOS 139* 05/13/2015 0845   BILITOT 0.6 05/13/2015 0845   BILIDIR 0.1 05/13/2015 0845   IBILI 0.5 05/13/2015 0845    CBC    Component Value Date/Time   WBC 7.9 05/13/2015 0850   RBC 4.80 05/13/2015 0850   HGB 13.9 05/13/2015 0850   HCT 40.7 05/13/2015 0850   PLT 243 05/13/2015 0850   MCV 84.8 05/13/2015 0850   MCH 29.0 05/13/2015 0850   MCHC 34.2 05/13/2015 0850   RDW 14.4 05/13/2015 0850   LYMPHSABS 1.3 05/13/2015 0850   MONOABS 0.7 05/13/2015 0850   EOSABS 0.8* 05/13/2015 0850   BASOSABS 0.1 05/13/2015 0850     12/30/2014 HBsAg screen negative       Review of Systems Past Medical History  Diagnosis Date  . Anxiety   . Hypertension   . Bronchitis   . Mixed hyperlipidemia   . Alcohol abuse   . Hepatitis C     Past Surgical History  Procedure Laterality Date  . Colonoscopy N/A 01/23/2014    SLF:The LEFT COLON IS EXTREMELY redundant/TWO RECTAL POLYPS REMOVED/SMALL VOLUME RECTAL BLEEDING MOST LIKELY DUE TO Small internal hemorrhoids  . Esophagogastroduodenoscopy N/A 01/23/2014    [id number] ring at the gastroesophageal junction/Medium sized hiatal hernia/NDYSPEPSIA MOST LIKELY DUE TO GERD/MILD Non-erosive gastritis  . Hemorrhoid banding  01/23/2014    Procedure: HEMORRHOID BANDING;  Surgeon: Danie Binder, MD;  Location: [facility];  Service:  Endoscopy;;    No Known Allergies  Current Outpatient Prescriptions on File Prior to Visit  Medication Sig Dispense Refill  . AMLODIPINE BESYLATE PO Take 10 mg by mouth daily.    Marland Kitchen aspirin EC 81 MG tablet Take 81 mg by mouth daily.    Marland Kitchen losartan (COZAAR) 50 MG tablet Take 50 mg by mouth daily.     No current facility-administered medications on file prior to visit.        Objective:   Physical Exam Blood pressure 118/90, pulse 64, temperature 97.9 F (36.6 C), height 5\' 8"  (1.727 m), weight 245 lb 9.6 oz (111.403 kg).  Alert and oriented. Skin warm and dry. Oral mucosa is moist.   . Sclera anicteric, conjunctivae is pink. Thyroid not enlarged. No cervical lymphadenopathy. Lungs clear. Heart regular rate and rhythm.  Abdomen is soft. Bowel sounds are positive. No hepatomegaly. No abdominal masses felt. No tenderness. Abdomen obese.  No edema to lower extremities        Assessment & Plan:  Hepatitis C. He has cleared the virus. He is doing well.  OV in 8 weeks. CBC with diff, CMEt and Hep C quaint in 8 weeks.

## 2015-05-15 NOTE — Telephone Encounter (Signed)
.  Per Lelon Perla Patient is to have lab work drawn in 8 weeks.

## 2015-05-15 NOTE — Patient Instructions (Signed)
OV in 8 weeks.  

## 2015-07-03 ENCOUNTER — Encounter (INDEPENDENT_AMBULATORY_CARE_PROVIDER_SITE_OTHER): Payer: Self-pay | Admitting: *Deleted

## 2015-07-03 ENCOUNTER — Other Ambulatory Visit (INDEPENDENT_AMBULATORY_CARE_PROVIDER_SITE_OTHER): Payer: Self-pay | Admitting: *Deleted

## 2015-07-03 DIAGNOSIS — B182 Chronic viral hepatitis C: Secondary | ICD-10-CM

## 2015-07-08 ENCOUNTER — Telehealth (INDEPENDENT_AMBULATORY_CARE_PROVIDER_SITE_OTHER): Payer: Self-pay | Admitting: *Deleted

## 2015-07-08 NOTE — Telephone Encounter (Signed)
Patient was wanting to redo this to send to [facility] to get a req with their info.

## 2015-07-10 ENCOUNTER — Telehealth (INDEPENDENT_AMBULATORY_CARE_PROVIDER_SITE_OTHER): Payer: Self-pay | Admitting: *Deleted

## 2015-07-10 ENCOUNTER — Encounter (INDEPENDENT_AMBULATORY_CARE_PROVIDER_SITE_OTHER): Payer: Self-pay | Admitting: Internal Medicine

## 2015-07-10 ENCOUNTER — Ambulatory Visit (INDEPENDENT_AMBULATORY_CARE_PROVIDER_SITE_OTHER): Payer: BLUE CROSS/BLUE SHIELD | Admitting: Internal Medicine

## 2015-07-10 VITALS — BP 150/90 | HR 64 | Temp 97.4°F | Ht 68.0 in | Wt 253.1 lb

## 2015-07-10 DIAGNOSIS — B1920 Unspecified viral hepatitis C without hepatic coma: Secondary | ICD-10-CM | POA: Diagnosis not present

## 2015-07-10 DIAGNOSIS — B182 Chronic viral hepatitis C: Secondary | ICD-10-CM

## 2015-07-10 NOTE — Patient Instructions (Signed)
OV in 1 year with labs.

## 2015-07-10 NOTE — Telephone Encounter (Signed)
.  Per Mathew Lara patient will need to have lab work in 1 year.

## 2015-07-10 NOTE — Progress Notes (Addendum)
Subjective:    Patient ID: Mathew Lara, male    DOB: 04/30/1961, 54 y.o.   MRN: [id number]  HPIHere today for f/u of his Hepatitis C. Genotype 1B. Started tx with [name] 04/15/2015. This is week 12 for him. He finished the [name] yesterday.  He has cleared the virus.   He says he is doing good. His appetite is good. He has gained 8 pounds since his last visit in September. He works night shift at [employer]. He feels good. No joint pain. No rashes.  He is very happy he has cleared the virus.  07/08/2015 Hep C quaint undetected.  H and H 13.4 and 40.1, ALP 164, AST 29, ALT 28.  03/26/2015 [place] Elast F0-F1.    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  05/13/2015 Hep C quaint undetected   CBC    Component Value Date/Time   WBC 7.9 05/13/2015 0850   RBC 4.80 05/13/2015 0850   HGB 13.9 05/13/2015 0850   HCT 40.7 05/13/2015 0850   PLT 243 05/13/2015 0850   MCV 84.8 05/13/2015 0850   MCH 29.0 05/13/2015 0850   MCHC 34.2 05/13/2015 0850   RDW 14.4 05/13/2015 0850   LYMPHSABS 1.3 05/13/2015 0850   MONOABS 0.7 05/13/2015 0850   EOSABS 0.8* 05/13/2015 0850   BASOSABS 0.1 05/13/2015 0850    Hepatic Function Panel     Component Value Date/Time   PROT 8.4* 05/13/2015 0845   ALBUMIN 4.0 05/13/2015 0845   AST 17 05/13/2015 0845   ALT 12 05/13/2015 0845   ALKPHOS 139* 05/13/2015 0845   BILITOT 0.6 05/13/2015 0845   BILIDIR 0.1 05/13/2015 0845   IBILI 0.5 05/13/2015 0845        Review of Systems Past Medical History  Diagnosis Date  . Anxiety   . Hypertension   . Bronchitis   . Mixed hyperlipidemia   . Alcohol abuse   . Hepatitis C     Past Surgical History  Procedure Laterality Date  . Colonoscopy N/A 01/23/2014    SLF:The LEFT COLON IS EXTREMELY redundant/TWO RECTAL POLYPS REMOVED/SMALL VOLUME RECTAL BLEEDING MOST LIKELY DUE TO Small internal hemorrhoids  . Esophagogastroduodenoscopy N/A 01/23/2014    [id number] ring at the gastroesophageal junction/Medium sized hiatal  hernia/NDYSPEPSIA MOST LIKELY DUE TO GERD/MILD Non-erosive gastritis  . Hemorrhoid banding  01/23/2014    Procedure: HEMORRHOID BANDING;  Surgeon: Danie Binder, MD;  Location: [facility];  Service: Endoscopy;;    No Known Allergies  Current Outpatient Prescriptions on File Prior to Visit  Medication Sig Dispense Refill  . AMLODIPINE BESYLATE PO Take 10 mg by mouth daily.    Marland Kitchen aspirin EC 81 MG tablet Take 81 mg by mouth daily.    Marland Kitchen losartan (COZAAR) 50 MG tablet Take 50 mg by mouth daily.    . Vitamin D, Ergocalciferol, (DRISDOL) 50000 UNITS CAPS capsule Take 50,000 Units by mouth every 7 (seven) days.     No current facility-administered medications on file prior to visit.        Objective:   Physical Exam Blood pressure 150/90, pulse 64, temperature 97.4 F (36.3 C), height 5\' 8"  (1.727 m), weight 253 lb 1.6 oz (114.805 kg). Alert and oriented. Skin warm and dry. Oral mucosa is moist.   . Sclera anicteric, conjunctivae is pink. Thyroid not enlarged. No cervical lymphadenopathy. Lungs clear. Heart regular rate and rhythm.  Abdomen is soft. Bowel sounds are positive. No hepatomegaly. No abdominal masses felt. No tenderness.  No edema  to lower extremities.          Assessment & Plan:  Hepatitis C. He has cleared the virus. Labs for this month ar pending. He had lab drawn from [place].  OV in 1 year.

## 2015-07-11 ENCOUNTER — Encounter (INDEPENDENT_AMBULATORY_CARE_PROVIDER_SITE_OTHER): Payer: Self-pay

## 2015-09-07 HISTORY — PX: JOINT REPLACEMENT: SHX530

## 2015-10-16 ENCOUNTER — Ambulatory Visit (HOSPITAL_COMMUNITY)
Admission: RE | Admit: 2015-10-16 | Discharge: 2015-10-16 | Disposition: A | Discharge status: Home / Self Care | Payer: BLUE CROSS/BLUE SHIELD | Source: Ambulatory Visit | Attending: Family Medicine | Admitting: Family Medicine

## 2015-10-16 ENCOUNTER — Other Ambulatory Visit (HOSPITAL_COMMUNITY): Payer: Self-pay | Admitting: Family Medicine

## 2015-10-16 DIAGNOSIS — Z6837 Body mass index (BMI) 37.0-37.9, adult: Secondary | ICD-10-CM | POA: Insufficient documentation

## 2015-10-16 DIAGNOSIS — Z1389 Encounter for screening for other disorder: Secondary | ICD-10-CM | POA: Insufficient documentation

## 2015-10-16 DIAGNOSIS — M25551 Pain in right hip: Secondary | ICD-10-CM

## 2015-11-14 ENCOUNTER — Encounter (HOSPITAL_COMMUNITY): Payer: Self-pay | Admitting: Emergency Medicine

## 2015-11-14 ENCOUNTER — Emergency Department (HOSPITAL_COMMUNITY)
Admission: EM | Admit: 2015-11-14 | Discharge: 2015-11-14 | Disposition: A | Discharge status: Home / Self Care | Payer: BLUE CROSS/BLUE SHIELD | Attending: Emergency Medicine | Admitting: Emergency Medicine

## 2015-11-14 ENCOUNTER — Emergency Department (HOSPITAL_COMMUNITY): Payer: BLUE CROSS/BLUE SHIELD

## 2015-11-14 DIAGNOSIS — Z79899 Other long term (current) drug therapy: Secondary | ICD-10-CM | POA: Insufficient documentation

## 2015-11-14 DIAGNOSIS — M87052 Idiopathic aseptic necrosis of left femur: Secondary | ICD-10-CM

## 2015-11-14 DIAGNOSIS — M87051 Idiopathic aseptic necrosis of right femur: Secondary | ICD-10-CM

## 2015-11-14 DIAGNOSIS — M87852 Other osteonecrosis, left femur: Secondary | ICD-10-CM | POA: Insufficient documentation

## 2015-11-14 DIAGNOSIS — E782 Mixed hyperlipidemia: Secondary | ICD-10-CM | POA: Insufficient documentation

## 2015-11-14 DIAGNOSIS — I10 Essential (primary) hypertension: Secondary | ICD-10-CM | POA: Insufficient documentation

## 2015-11-14 DIAGNOSIS — Z87891 Personal history of nicotine dependence: Secondary | ICD-10-CM | POA: Diagnosis not present

## 2015-11-14 DIAGNOSIS — M25551 Pain in right hip: Secondary | ICD-10-CM | POA: Diagnosis present

## 2015-11-14 DIAGNOSIS — M87851 Other osteonecrosis, right femur: Secondary | ICD-10-CM | POA: Insufficient documentation

## 2015-11-14 DIAGNOSIS — Z7982 Long term (current) use of aspirin: Secondary | ICD-10-CM | POA: Diagnosis not present

## 2015-11-14 DIAGNOSIS — M25559 Pain in unspecified hip: Secondary | ICD-10-CM

## 2015-11-14 HISTORY — DX: Pain in unspecified hip: M25.559

## 2015-11-14 HISTORY — DX: Other chronic pain: G89.29

## 2015-11-14 MED ORDER — OXYCODONE-ACETAMINOPHEN 5-325 MG PO TABS
1.0000 | ORAL_TABLET | Freq: Once | ORAL | Status: AC
  Administered 2015-11-14: 1 via ORAL
  Filled 2015-11-14: qty 1

## 2015-11-14 MED ORDER — HYDROCODONE-ACETAMINOPHEN 5-325 MG PO TABS: 1.0000 | ORAL_TABLET | ORAL | Status: DC | PRN

## 2015-11-14 NOTE — ED Notes (Signed)
Patient with c/o chronic hip pain. Has been to PCP and given tapering dose of prednisone and pain medication. Prednisone pack complete, states he is not taking pain medication because "it doesn't do no good". States referral to ortho is in progress but has yet to have it completed. Poor historian.

## 2015-11-14 NOTE — Discharge Instructions (Signed)
Avascular Necrosis Avascular necrosis is a disease resulting from the temporary or permanent loss of blood supply to a bone. This disease may also be known as:   Osteonecrosis.   Aseptic necrosis.   Ischemic bone necrosis. Without proper blood supply, the internal layer of the affected bone dies and the outer layer of the bone may break down. If this process affects a bone near a joint, it may lead to collapse of that joint. Common bones that are affected by this condition include:  The top of your thigh bone (femoral head).  One or more bones in your wrist (scaphoid orlunate).  One or more bones in your foot (metatarsals).  One of the bones in your ankle (navicular). The joint most commonly affected by this condition is the hip joint. Avascular necrosis rarely occurs in more than one bone at a time.  CAUSES   Damage or injury to a bone or joint.  Using corticosteroid medicine for a long period of time.  Changes in your immune or hormone systems.  Excessive exposure to radiation. RISK FACTORS  Alcohol abuse.  Previous traumatic injury to a joint.  Using corticosteroid medicines for a long period of time or often.  Having a medical condition such as:  HIV or AIDS.   Diabetes.   Sickle cell disease.  An autoimmune disease. SIGNS AND SYMPTOMS  The main symptoms of avascular necrosis are pain and decreased motion in the affected bone or joint. In the early stages the pain may be minor and occur only with activity. As avascular necrosis progresses, pain may gradually worsen and occur while at rest. The pain may suddenly become severe if an affected joint collapses. DIAGNOSIS  Avascular necrosis may be diagnosed with:   A medical history.  A physical exam.   X-rays.  An MRI.  A bone scan. TREATMENT  Treatments may include:  Medicine to help relieve pain.  Avoiding placing any pressure or weight ontheaffected area. If avascular necrosis occurs in  your hip, ankle, or foot, you may be instructed to use crutches or a rolling scooter.  Surgery, such as:   Core decompression. In this surgery, one or more holes are placed in the bone for new blood vessels to grow into. This provides a renewed blood supply to the bone. Core decompression can often reduce pain and pressure in the affected bone and slow the progression of bone and joint destruction.  Osteotomy. In this surgery, the bone is reshaped to reduce stress on the affected area of the joint.   Bone grafting. In this surgery, healthy bone from one part of your body is transplanted to the affected area.   Arthroplasty. Arthroplasty is also known as total joint replacement. In this surgery, the affected surface on one or both sides of a joint is replaced with artificial parts (prostheses).  Electrical stimulation. This may help encourage new bone growth. HOME CARE INSTRUCTIONS  Take medicines only as directed by your health care provider.   Follow your health care provider's recommendations on limiting activities or using crutches to rest your affected joint.   Meet with aphysical therapist as directed by your health care provider.   Keep all follow-up visits as directed by your health care provider. This is important. SEEK MEDICAL CARE IF:   Your pain worsens.  You have decreased motion in your affected joint. SEEK IMMEDIATE MEDICAL CARE IF:  Your pain suddenly becomes severe.   This information is not intended to replace advice given to you  by your health care provider. Make sure you discuss any questions you have with your health care provider.   Document Released: 02/12/2002 Document Revised: 09/13/2014 Document Reviewed: 10/31/2013 Elsevier Interactive Patient Education [employer].

## 2015-11-14 NOTE — ED Notes (Signed)
Mathew Lara at bedside. 

## 2015-11-14 NOTE — ED Provider Notes (Signed)
CSN: [id number]     Arrival date & time 11/14/15  0848 History  By signing my name below, I, Terressa Koyanagi, attest that this documentation has been prepared under the direction and in the presence of Evalee Jefferson, PA-C. Electronically Signed: Terressa Koyanagi, ED Scribe. 11/16/2015. 10:57 AM.  Chief Complaint  Patient presents with  . Hip Pain   The history is provided by the patient. No language interpreter was used.   PCP: Rocky Morel, MD HPI Comments: Mathew Lara is a 55 y.o. male, with PMHx noted below, however contrary to PMHx noted below, pt denies any Hx of chronic hip pain, who presents to the Emergency Department complaining of atraumatic, acute, sudden onset, sharp, severe hip pain with current episode onset 3 weeks ago. Associated Sx include difficulty walking secondary to pain. 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 Aggravating factors include movement, walking, and coughing. Modifying factors involve applying heat to the affected area and pain meds. Pt was seen for the same by his PCP who Rx a tapering dose of prednisone and pain medication. Pt notes his PCP is also referring him to orthopedics and is currently waiting this referral. Pt denies any knee pain, bruising in affected area, lower back pain, pelvic pain.   Past Medical History  Diagnosis Date  . Anxiety   . Hypertension   . Bronchitis   . Mixed hyperlipidemia   . Alcohol abuse   . Hepatitis C   . Renal disorder     states renal dysfunction, unable to tell staff what is wrong   . Chronic hip pain    Past Surgical History  Procedure Laterality Date  . Colonoscopy N/A 01/23/2014    SLF:The LEFT COLON IS EXTREMELY redundant/TWO RECTAL POLYPS REMOVED/SMALL VOLUME RECTAL BLEEDING MOST LIKELY DUE TO Small internal hemorrhoids  . Esophagogastroduodenoscopy N/A 01/23/2014    [id number] ring at the gastroesophageal  junction/Medium sized hiatal hernia/NDYSPEPSIA MOST LIKELY DUE TO GERD/MILD Non-erosive gastritis  . Hemorrhoid banding  01/23/2014    Procedure: HEMORRHOID BANDING;  Surgeon: Danie Binder, MD;  Location: [facility];  Service: Endoscopy;;   Family History  Problem Relation Age of Onset  . Cancer Mother    Social History  Substance Use Topics  . Smoking status: Former Smoker    Types: Cigarettes  . Smokeless tobacco: Never Used     Comment: quit in November 2015 after smoking x 20 yrs.  . Alcohol Use: 0.0 oz/week    0 Standard drinks or equivalent per week     Comment: Occasonaly drinks a beer    Review of Systems  Constitutional: Negative for appetite change and fatigue.  HENT: Negative for congestion, ear discharge and sinus pressure.   Eyes: Negative for discharge.  Respiratory: Negative for cough.   Cardiovascular: Negative for chest pain.  Gastrointestinal: Negative for abdominal pain and diarrhea.  Genitourinary: Negative for frequency and hematuria.  Musculoskeletal: Positive for arthralgias and gait problem (secondary to pain). Negative for back pain.  Skin: Negative for rash.  Neurological: Negative for seizures and headaches.  Psychiatric/Behavioral: Negative for hallucinations.   Allergies  Review of patient's allergies indicates no known allergies.  Home Medications   Prior to Admission medications   Medication Sig Start Date End Date Taking? Authorizing Provider  amLODipine (NORVASC) 10 MG tablet Take 10 mg by mouth daily.   Yes Historical Provider, MD  aspirin  EC 81 MG tablet Take 81 mg by mouth daily.   Yes Historical Provider, MD  losartan (COZAAR) 50 MG tablet Take 50 mg by mouth daily.   Yes Historical Provider, MD  HYDROcodone-acetaminophen (NORCO/VICODIN) 5-325 MG tablet Take 1 tablet by mouth every 4 (four) hours as needed. 11/14/15   Evalee Jefferson, PA-C   Triage Vitals: BP 125/105 mmHg  Pulse 85  Temp(Src) 97.7 F (36.5 C) (Oral)  Resp 16  Ht 5'  8" (1.727 m)  Wt 250 lb (113.399 kg)  BMI 38.02 kg/m2  SpO2 97% Physical Exam  Constitutional: He is oriented to person, place, and time. He appears well-developed and well-nourished.  HENT:  Head: Normocephalic.  Eyes: EOM are normal.  Neck: Normal range of motion.  Pulmonary/Chest: Effort normal.  Abdominal: He exhibits no distension.  Musculoskeletal: Normal range of motion.       Right upper leg: He exhibits tenderness (Pain worsened with ranging the hip, standing and walking. Asymmetry of upper posterior thigh with right being fuller than left along with increased tenderness).       Legs: Neurological: He is alert and oriented to person, place, and time.  Psychiatric: He has a normal mood and affect.  Nursing note and vitals reviewed.   ED Course  Procedures (including critical care time) DIAGNOSTIC STUDIES: Oxygen Saturation is 97% on ra, nl by my interpretation.    COORDINATION OF CARE: 10:47 AM: Discussed treatment plan which includes CT scan of the thigh and meds for pain management with pt at bedside; patient verbalizes understanding and agrees with treatment plan.  Imaging Review CLINICAL DATA: Acute onset right hip pain over the past 3 weeks. No known injury. Initial encounter.  EXAM: MR OF THE RIGHT HIP WITHOUT CONTRAST  TECHNIQUE: Multiplanar, multisequence MR imaging was performed. No intravenous contrast was administered.  COMPARISON: Plain films right hip 10/16/2015.  FINDINGS: Bones: There is extensive avascular necrosis of the femoral heads bilaterally. Changes appear somewhat worse on the right. There is marrow edema in the right femoral neck related to avascular necrosis. The right femoral head appears minimally flattened but not obviously fragmented. Small subchondral cysts in the right acetabular roof are identified.  Articular cartilage and labrum  Articular cartilage: Mildly thinned without focal defect.  Labrum: Degenerated without  discrete tear identified.  Joint or bursal effusion  Joint effusion: Small right hip joint effusion is seen.  Bursae: No evidence of bursitis.  Muscles and tendons  Muscles and tendons: Intact.  Other findings  Miscellaneous: Imaged intrapelvic contents are unremarkable.  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 Associated right hip joint effusion is compatible with synovitis.  Mild right hip osteoarthritis.   Electronically Signed By: Inge Rise M.D. On: 11/14/2015 12:19  I have personally reviewed and evaluated these images as part of my medical decision-making.   MDM   Final diagnoses:  Hip pain, acute  Avascular necrosis of bones of both hips [facility])     Radiological studies were viewed, interpreted and considered during the medical decision making and disposition process. I agree with radiologists reading.  Results were also discussed with patient.  Hydrocodone prescribed. Referral to Dr. Aline Brochure for further eval/management of findings.  I personally performed the services described in this documentation, which was scribed in my presence. The recorded information has been reviewed and is accurate.   Evalee Jefferson, PA-C 11/16/15 2158  Francine Graven, DO  11/17/15 1643 

## 2015-11-14 NOTE — ED Notes (Signed)
Patient with no complaints at this time. Respirations even and unlabored. Skin warm/dry. Discharge instructions reviewed with patient at this time. Patient given opportunity to voice concerns/ask questions. Patient discharged at this time and left Emergency Department with steady gait.   

## 2015-11-19 ENCOUNTER — Ambulatory Visit (INDEPENDENT_AMBULATORY_CARE_PROVIDER_SITE_OTHER): Payer: BLUE CROSS/BLUE SHIELD | Admitting: Orthopedic Surgery

## 2015-11-19 ENCOUNTER — Ambulatory Visit (INDEPENDENT_AMBULATORY_CARE_PROVIDER_SITE_OTHER): Payer: BLUE CROSS/BLUE SHIELD

## 2015-11-19 VITALS — BP 179/91 | HR 91 | Ht 68.0 in | Wt 246.6 lb

## 2015-11-19 DIAGNOSIS — M545 Low back pain, unspecified: Secondary | ICD-10-CM

## 2015-11-19 MED ORDER — GABAPENTIN 100 MG PO CAPS: 100.0000 mg | ORAL_CAPSULE | Freq: Three times a day (TID) | ORAL | Status: DC

## 2015-11-19 MED ORDER — NABUMETONE 500 MG PO TABS: 500.0000 mg | ORAL_TABLET | Freq: Two times a day (BID) | ORAL | Status: DC

## 2015-11-19 MED ORDER — ORPHENADRINE CITRATE ER 100 MG PO TB12: 100.0000 mg | ORAL_TABLET | Freq: Two times a day (BID) | ORAL | Status: DC

## 2015-11-19 NOTE — Patient Instructions (Addendum)
Call hospital to arrange PT   Back Pain, Adult Back pain is very common in adults.The cause of back pain is rarely dangerous and the pain often gets better over time.The cause of your back pain may not be known. Some common causes of back pain include:  Strain of the muscles or ligaments supporting the spine.  Wear and tear (degeneration) of the spinal disks.  Arthritis.  Direct injury to the back. For many people, back pain may return. Since back pain is rarely dangerous, most people can learn to manage this condition on their own. HOME CARE INSTRUCTIONS Watch your back pain for any changes. The following actions may help to lessen any discomfort you are feeling:  Remain active. It is stressful on your back to sit or stand in one place for long periods of time. Do not sit, drive, or stand in one place for more than 30 minutes at a time. Take short walks on even surfaces as soon as you are able.Try to increase the length of time you walk each day.  Exercise regularly as directed by your health care provider. Exercise helps your back heal faster. It also helps avoid future injury by keeping your muscles strong and flexible.  Do not stay in bed.Resting more than 1-2 days can delay your recovery.  Pay attention to your body when you bend and lift. The most comfortable positions are those that put less stress on your recovering back. Always use proper lifting techniques, including:  Bending your knees.  Keeping the load close to your body.  Avoiding twisting.  Find a comfortable position to sleep. Use a firm mattress and lie on your side with your knees slightly bent. If you lie on your back, put a pillow under your knees.  Avoid feeling anxious or stressed.Stress increases muscle tension and can worsen back pain.It is important to recognize when you are anxious or stressed and learn ways to manage it, such as with exercise.  Take medicines only as directed by your health care  provider. Over-the-counter medicines to reduce pain and inflammation are often the most helpful.Your health care provider may prescribe muscle relaxant drugs.These medicines help dull your pain so you can more quickly return to your normal activities and healthy exercise.  Apply ice to the injured area:  Put ice in a plastic bag.  Place a towel between your skin and the bag.  Leave the ice on for 20 minutes, 2-3 times a day for the first 2-3 days. After that, ice and heat may be alternated to reduce pain and spasms.  Maintain a healthy weight. Excess weight puts extra stress on your back and makes it difficult to maintain good posture. SEEK MEDICAL CARE IF:  You have pain that is not relieved with rest or medicine.  You have increasing pain going down into the legs or buttocks.  You have pain that does not improve in one week.  You have night pain.  You lose weight.  You have a fever or chills. SEEK IMMEDIATE MEDICAL CARE IF:   You develop new bowel or bladder control problems.  You have unusual weakness or numbness in your arms or legs.  You develop nausea or vomiting.  You develop abdominal pain.  You feel faint.   This information is not intended to replace advice given to you by your health care provider. Make sure you discuss any questions you have with your health care provider.   Document Released: 08/23/2005 Document Revised: 09/13/2014 Document Reviewed:  12/25/2013 Elsevier Interactive Patient Education [employer].

## 2015-11-19 NOTE — Progress Notes (Signed)
Chief Complaint  Patient presents with  . Hip Pain    Right hip pain   HPI  55 year old male presents for evaluation of his right hip. He's had right hip pain for the last 4 weeks which started in his right side radiated down the back of his right thigh and was unrelieved by icy hot, heating pad, Sterapred dosepak. He eventually had x-rays done which were inconclusive had an MRI of his hip which showed bilateral AVN and he presents for evaluation and treatment complaining of severe pain which caused him to miss 2 weeks of work so he did have some rest and the medications described but without relief of pain is now on hydrocodone with moderate relief The avascular necrosis appears to be alcohol related as he once was a heavy drinker but has since stopped  He's had hypertension and is on antihypertensive medication Review of Systems  Constitutional: Negative for fever, chills and weight loss.  Musculoskeletal: Positive for joint pain.  All other systems reviewed and are negative.   Past Medical History  Diagnosis Date  . Anxiety   . Hypertension   . Bronchitis   . Mixed hyperlipidemia   . Alcohol abuse   . Hepatitis C   . Renal disorder     states renal dysfunction, unable to tell staff what is wrong   . Chronic hip pain     Past Surgical History  Procedure Laterality Date  . Colonoscopy N/A 01/23/2014    SLF:The LEFT COLON IS EXTREMELY redundant/TWO RECTAL POLYPS REMOVED/SMALL VOLUME RECTAL BLEEDING MOST LIKELY DUE TO Small internal hemorrhoids  . Esophagogastroduodenoscopy N/A 01/23/2014    [id number] ring at the gastroesophageal junction/Medium sized hiatal hernia/NDYSPEPSIA MOST LIKELY DUE TO GERD/MILD Non-erosive gastritis  . Hemorrhoid banding  01/23/2014    Procedure: HEMORRHOID BANDING;  Surgeon: Danie Binder, MD;  Location: [facility];  Service: Endoscopy;;   Family History  Problem Relation Age of Onset  . Cancer Mother    Social History  Substance Use Topics   . Smoking status: Former Smoker    Types: Cigarettes  . Smokeless tobacco: Never Used     Comment: quit in November 2015 after smoking x 20 yrs.  . Alcohol Use: 0.0 oz/week    0 Standard drinks or equivalent per week     Comment: Occasonaly drinks a beer    Current outpatient prescriptions:  .  amLODipine (NORVASC) 10 MG tablet, Take 10 mg by mouth daily., Disp: , Rfl:  .  aspirin EC 81 MG tablet, Take 81 mg by mouth daily., Disp: , Rfl:  .  HYDROcodone-acetaminophen (NORCO/VICODIN) 5-325 MG tablet, Take 1 tablet by mouth every 4 (four) hours as needed., Disp: 20 tablet, Rfl: 0 .  losartan (COZAAR) 50 MG tablet, Take 50 mg by mouth daily., Disp: , Rfl:   BP 179/91 mmHg  Pulse 91  Ht 5\' 8"  (1.727 m)  Wt 246 lb 9.6 oz (111.857 kg)  BMI 37.50 kg/m2  Physical Exam  Constitutional: He is oriented to person, place, and time. He appears well-developed and well-nourished. No distress.  Cardiovascular: Normal rate and intact distal pulses.   Musculoskeletal:  GAIT: CANE  AND LIMPING RIGHT LEG FAVORED   Neurological: He is alert and oriented to person, place, and time.  Skin: Skin is warm and dry. No rash noted. He is not diaphoretic. No erythema. No pallor.  Psychiatric: He has a normal mood and affect. His behavior is normal. Judgment and thought content normal.  Right Hip Exam   Tenderness  The patient is experiencing no tenderness.     Range of Motion  The patient has normal right hip ROM.  Muscle Strength  The patient has normal right hip strength.  Other  Erythema: absent Scars: absent Sensation: normal Pulse: present  Comments:  Right sided lumbar and SI joint tenderness with negative straight leg raises  2+ Achilles and ankle jerk reflex   Left Hip Exam   Tenderness  The patient is experiencing no tenderness.     Range of Motion  The patient has normal left hip ROM.  Muscle Strength  The patient has normal left hip strength.   Other  Erythema:  absent Scars: absent Sensation: normal Pulse: present  Comments:  Left sided lumbar nontender  Negative straight leg raise  2+ Achilles and ankle jerk reflex       ASSESSMENT: My personal interpretation of the images:  AP of the pelvis and x-rays right hip show mild deformity of the femoral head appears to have a short femoral neck bilaterally possibility of some sclerotic bone in the femoral head  On MRI this is manifested as avascular necrosis  Lumbar spine films also ordered in the office  Report abnormal coronal plane alignment most likely secondary to muscle spasm lumbar facet arthritis    PLAN Physical therapy Meds ordered this encounter  Medications  . gabapentin (NEURONTIN) 100 MG capsule    Sig: Take 1 capsule (100 mg total) by mouth 3 (three) times daily.    Dispense:  60 capsule    Refill:  2  . nabumetone (RELAFEN) 500 MG tablet    Sig: Take 1 tablet (500 mg total) by mouth 2 (two) times daily.    Dispense:  60 tablet    Refill:  5  . orphenadrine (NORFLEX) 100 MG tablet    Sig: Take 1 tablet (100 mg total) by mouth 2 (two) times daily.    Dispense:  60 tablet    Refill:  2    Follow-up in a month

## 2015-12-10 ENCOUNTER — Ambulatory Visit (HOSPITAL_COMMUNITY): Payer: BLUE CROSS/BLUE SHIELD | Attending: Orthopedic Surgery

## 2015-12-10 ENCOUNTER — Encounter (HOSPITAL_COMMUNITY): Payer: Self-pay

## 2015-12-10 DIAGNOSIS — M25552 Pain in left hip: Secondary | ICD-10-CM | POA: Insufficient documentation

## 2015-12-10 DIAGNOSIS — M545 Low back pain, unspecified: Secondary | ICD-10-CM

## 2015-12-10 DIAGNOSIS — R269 Unspecified abnormalities of gait and mobility: Secondary | ICD-10-CM | POA: Insufficient documentation

## 2015-12-10 DIAGNOSIS — R293 Abnormal posture: Secondary | ICD-10-CM

## 2015-12-10 DIAGNOSIS — R2689 Other abnormalities of gait and mobility: Secondary | ICD-10-CM | POA: Diagnosis present

## 2015-12-10 DIAGNOSIS — M25551 Pain in right hip: Secondary | ICD-10-CM | POA: Insufficient documentation

## 2015-12-10 DIAGNOSIS — M6281 Muscle weakness (generalized): Secondary | ICD-10-CM | POA: Diagnosis present

## 2015-12-10 DIAGNOSIS — R29898 Other symptoms and signs involving the musculoskeletal system: Secondary | ICD-10-CM | POA: Insufficient documentation

## 2015-12-10 NOTE — Patient Instructions (Signed)
   SINGLE KNEE TO CHEST STRETCH - Cale  While Lying on your back,  hold your knee and gently pull it up towards your chest.  Complete 3 sets on both legs, and hold each stretch for 30 seconds        PIRIFORMIS STRETCH - MODIFIED  While lying on your back, hold your knee with your opposite hand and draw your knee up and over towards your opposite shoulder.  Complete 3 sets on both legs, and hold each stretch for 30 seconds         Isometric Transverse abdominal contraction  Lay on your back with your knees bent.    Place your thumbs on your stomach just inside your hip bones to feel the muscle contract.  Activate your abdominals by pulling everything in.  "Try to bring your naval to your spine."  Hold this contraction for as long as possible to improve endurance.    Learn to use this muscle with daily activities such as lifting, bending, rolling.   Complete 10 reps holding each rep for 5 seconds, repeat 2-3x/day

## 2015-12-10 NOTE — Therapy (Signed)
[facility] [address] [place], [place], [address] Phone: [phone]   Fax:  [phone]  Physical Therapy Evaluation  Patient Details  Name: Mathew Lara MRN: [id number] Date of Birth: 12/22/1960 Referring Provider: Dr. Arther Abbott  Encounter Date: 12/10/2015      PT End of Session - 12/10/15 0920    Visit Number 1   Number of Visits 19   Date for PT Re-Evaluation 01/09/16   Authorization Type [employer]    Authorization Time Period 12/10/2015 to 01/21/2016    PT Start Time 0845   PT Stop Time 0928   PT Time Calculation (min) 43 min   Activity Tolerance Patient tolerated treatment well   Behavior During Therapy [facility] for tasks assessed/performed      Past Medical History  Diagnosis Date  . Anxiety   . Hypertension   . Bronchitis   . Mixed hyperlipidemia   . Alcohol abuse   . Hepatitis C   . Renal disorder     states renal dysfunction, unable to tell staff what is wrong   . Chronic hip pain     Past Surgical History  Procedure Laterality Date  . Colonoscopy N/A 01/23/2014    SLF:The LEFT COLON IS EXTREMELY redundant/TWO RECTAL POLYPS REMOVED/SMALL VOLUME RECTAL BLEEDING MOST LIKELY DUE TO Small internal hemorrhoids  . Esophagogastroduodenoscopy N/A 01/23/2014    [id number] ring at the gastroesophageal junction/Medium sized hiatal hernia/NDYSPEPSIA MOST LIKELY DUE TO GERD/MILD Non-erosive gastritis  . Hemorrhoid banding  01/23/2014    Procedure: HEMORRHOID BANDING;  Surgeon: Danie Binder, MD;  Location: [facility];  Service: Endoscopy;;    There were no vitals filed for this visit.  Visit Diagnosis:  Right-sided low back pain without sciatica - Plan: PT plan of care cert/re-cert  Pain in right hip - Plan: PT plan of care cert/re-cert  Weakness of both lower extremities - Plan: PT plan of care cert/re-cert  Abnormal posture - Plan: PT plan of care cert/re-cert  Abnormality of gait - Plan: PT plan of care  cert/re-cert      Subjective Assessment - 12/10/15 0852    Subjective Mathew Lara is a 55 yo male who currently c/o R-sided LBP that began ~ 1 month ago while at work. 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 Patient was out of work for ~1-2 weeks due to his LBP. He eventually sought medical attention at the ED and had radiographs completed. He was subsequently referred to Dr. Aline Brochure for further assessment and was prescribed pain meds to assist with pain management. Patient noted that his symptoms have improved since being prescribed pain meds. He has returned to work full-time and is having minor difficulties with work related activities.    Pertinent History HTN    Limitations Standing   How long can you sit comfortably? 2 hours    How long can you stand comfortably? 2 hours    How long can you walk comfortably? 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    Currently in Pain? Yes   Pain Score 3   R-sided LBP ranges between a 0-6/10 on  a VAS   Pain Location Back   Pain Orientation Right;Lower   Pain Descriptors / Indicators Aching;Throbbing   Pain Type Acute pain   Pain Radiating Towards down into the R thigh    Pain Onset More than a month ago   Pain Frequency Intermittent   Aggravating Factors  prolonged standing and sit to stand transition after sitting for a long period of time    Pain Relieving Factors Rest, sitting, and prescribed pain meds   Effect of Pain on Daily Activities Limits the patient's ability to complete leisure and recreational activities    Multiple Pain Sites Yes   Pain Score 3    Pain Location Hip   Pain Orientation Right   Pain Descriptors / Indicators Aching;Sore   Pain Type Chronic pain   Pain Radiating Towards None    Pain Onset More than a month ago   Pain Frequency Intermittent   Aggravating Factors  WB activities and lifting    Pain Relieving Factors Rest, sitting, and prescribed pain meds   Effect of Pain on Daily Activities Limits the patient's ability to complete leisure and recreational activities             [facility] PT Assessment - 12/10/15 0001    Assessment   Medical Diagnosis LBP and R hip pain associated with avascular necrosis   Referring Provider Dr. Arther Abbott   Onset Date/Surgical Date 11/09/15   Hand Dominance Right   Next MD Visit December 23, 2015    Prior Therapy None for current complaints    Precautions   Precautions None   Balance Screen   Has the patient fallen in the past 6 months No   Has the patient had a decrease in activity level because of a fear of falling?  No   Is the patient reluctant to leave their home because of a fear of falling?  No   Prior Function   Level of Independence Independent   Vocation Full time employment   Vocation Requirements lifting    Leisure Dancing    Cognition   Overall Cognitive Status Within Functional Limits for tasks assessed   Sensation   Light Touch Appears Intact   Posture/Postural Control   Posture/Postural Control Postural limitations   Postural Limitations Rounded Shoulders;Forward head;Weight shift left   ROM / Strength   AROM / PROM / Strength AROM;Strength;PROM   AROM   AROM Assessment Site Lumbar   Lumbar Flexion WNL   Lumbar Extension 18 deg    Lumbar - Right Side Bend WNL   Lumbar - Left Side Bend WNL   Lumbar - Right Rotation 25 % limited    Lumbar - Left Rotation 25 % limited    PROM   Overall PROM  Within functional limits for tasks performed;Other (comment)  B hips    Strength   Right/Left Hip Right;Left   Right Hip Flexion 3+/5   Right Hip Extension  2+/5   Right Hip ABduction 3+/5   Left Hip Flexion 4-/5   Left Hip Extension 3+/5   Left Hip ABduction 4-/5   Right/Left Knee Right;Left   Right Knee Flexion 5/5   Right Knee Extension 5/5   Left Knee Flexion 5/5   Left Knee Extension 5/5   Right/Left Ankle Right;Left   Right Ankle Dorsiflexion 5/5   Right Ankle Plantar Flexion 4/5   Left Ankle Dorsiflexion 5/5   Left Ankle Plantar Flexion 4/5   Lumbar Flexion 3/5   Lumbar Extension 3/5  Flexibility   Soft Tissue Assessment /Muscle Length yes   Hamstrings SLR (L/R)= 70 deg/70 deg   Quadriceps Moderate tightness bilaterally with Thomas test    Piriformis Moderate tightness bilaterally   Palpation   Palpation comment Minor tenderness palpated of R-sided paraspinals and QL    Special Tests    Special Tests Lumbar   Lumbar Tests FABER test;Slump Test;Straight Leg Raise   FABER test   findings Positive   Side Right   Slump test   Findings Negative   Comment bilateral    Straight Leg Raise   Findings Negative   Comment bilateral    Ambulation/Gait   Ambulation/Gait Yes   Ambulation/Gait Assistance 7: Independent   Ambulation Distance (Feet) 140 Feet   Assistive device None   Gait Pattern Trendelenburg;Antalgic;Decreased stance time - right  R LE   Ambulation Surface Level;Indoor             PT Education - 12/10/15 0901    Education provided Yes   Education Details PT eval findings, initial HEP, core bracing with functional mobility/work related activities, and pain management strategies including cryotherapy 15-20 minutes, 3-4x/day    Person(s) Educated Patient   Methods Explanation;Demonstration   Comprehension Verbalized understanding;Returned demonstration;Need further instruction          PT Short Term Goals - 12/10/15 1214    PT SHORT TERM GOAL #1   Title Patient will independently verbalize and demo proper completion of his initial HEP to continue with LE/core strengthening.   Time 2   Period Weeks    Status New   PT SHORT TERM GOAL #2   Title Patient will independently verbalize pain management strategies including use of cryotherapy and moist heat therapy in order to manage pain levels with ADLs and functional mobility activities.   Time 3   Period Weeks   Status New   PT SHORT TERM GOAL #3   Title Patient will report decreased max R-sided LBP to a 5/10 on a VAS in order to improve tolerance with work related activities.   Time 3   Period Weeks   Status New   PT SHORT TERM GOAL #4   Title Patient will demo improved postural awareness and core activation while squatting with lifting activities with no cues required.    Time 3   Period Weeks   Status New           PT Long Term Goals - 12/10/15 1214    PT LONG TERM GOAL #1   Title Patient will independently verbalize and demo proper completion of his advanced HEP to continue with LE/core strengthening and LE stretches once DC from PT.    Time 6   Period Weeks   Status New   PT LONG TERM GOAL #2   Title Patient will improve B hip and core strength to >4/5 MMT in order to be able to ambulate >45 minutes without difficulty.    Time 6   Period Weeks   Status New   PT LONG TERM GOAL #3   Title Patient will report decreased R-sided LBP and R hip pain to a 0-3/10 on a VAS in order to be able to return to his leisure activities including dancing.    Time 6   Period Weeks   Status New   PT LONG TERM GOAL #4   Title Patient will be able to stand at work for >2 hours with LBP rated <2/10 on a VAS in order to improve  tolerance with work related activities and return to his PLOF.    Time 6   Period Weeks   Status New   PT LONG TERM GOAL #5   Title Patient will improve lumbar rotation AROM to WNL in order to improve mobility with recreational activities.    Time 6   Period Weeks   Status New               Plan - 12/10/15 [id number]    Clinical Impression Statement Mathew Lara is a 55 yo male who was referred to  outpatient PT secondary to R-sided LBP with addition of R hip pain that began ~1 month ago. The patient presents with signs and symptoms that are consistent with MD referral of LBP secondary to spondylosis. In addition, recent MRI indicated avascular necrosis of bilateral hips, which could also be attributed to his current complaints of R hip pain. Patient currently presents with impairments including impaired flexibility of B HS/quads/hip flexors/piriformis, B hip/core weakness, R-sided LBP, and poor postural alignment. Slump test and SLR test were negative for radicular pain in to either LE. Pain was not elicited with repeated lumbar flexion or extension, but patient reported low back "tightness" with lumbar extension. Lumbar AROM was WNL with slight limitations assessed with rotation, bilaterally. Furthermore, pt presents with R Trendelenburg during R stance phase indicating R glute med weakness. The patient would benefit from skilled PT in order to progress towards pain-free ambulation, working related activities, and leisure activities such as dancing.    Pt will benefit from skilled therapeutic intervention in order to improve on the following deficits Abnormal gait;Decreased strength;Improper body mechanics;Increased muscle spasms;Postural dysfunction;Impaired flexibility;Decreased range of motion;Hypomobility;Pain   Rehab Potential Good   PT Frequency 3x / week   PT Duration 6 weeks   PT Treatment/Interventions ADLs/Self Care Home Management;Cryotherapy;Electrical Stimulation;Moist Heat;Ultrasound;Therapeutic activities;Therapeutic exercise;Neuromuscular re-education;Patient/family education;Manual techniques;Passive range of motion;Taping   PT Next Visit Plan Next visit to focus on supine and seated core stabilization ther ex, piriformis/HS stretches, resisted clam shells in S/L position, and pt education regarding optimal posture in sitting/standing.    PT Home Exercise Plan HEP initiated this visit  with addition of piriformis stretch, SKTC, and tr abdominis activation in supine; Review and educate on HEP at next PT visit    Recommended Other Services None at this time    Consulted and Agree with Plan of Care Patient         Problem List Patient Active Problem List   Diagnosis Date Noted  . Hepatitis C 03/21/2015  . History of alcohol abuse 11/08/2013  . Tobacco abuse 11/08/2013  . Essential hypertension, benign 11/07/2013  . Abnormal ECG 11/07/2013    Garen Lah, PT, DPT  12/10/2015, 1:00 PM  [facility] [address] [place], [place], [address] Phone: [phone]   Fax:  [phone]  Name: Gregory Kollmann MRN: [id number] Date of Birth: 1961-01-13

## 2015-12-12 ENCOUNTER — Ambulatory Visit (HOSPITAL_COMMUNITY): Payer: BLUE CROSS/BLUE SHIELD

## 2015-12-12 ENCOUNTER — Telehealth (HOSPITAL_COMMUNITY): Payer: Self-pay

## 2015-12-12 DIAGNOSIS — R293 Abnormal posture: Secondary | ICD-10-CM

## 2015-12-12 DIAGNOSIS — M545 Low back pain, unspecified: Secondary | ICD-10-CM

## 2015-12-12 DIAGNOSIS — R2689 Other abnormalities of gait and mobility: Secondary | ICD-10-CM

## 2015-12-12 DIAGNOSIS — M25551 Pain in right hip: Secondary | ICD-10-CM

## 2015-12-12 DIAGNOSIS — M6281 Muscle weakness (generalized): Secondary | ICD-10-CM

## 2015-12-12 NOTE — Telephone Encounter (Signed)
No show, called and left message about missed apt., included next apt date and time with contact info given.  [address], [place]; [place] [phone]

## 2015-12-12 NOTE — Therapy (Addendum)
[place], [place], [address] Phone: [phone]   Fax:  [phone]  Physical Therapy Treatment  Patient Details  Name: Mathew Lara MRN: [id number] Date of Birth: June 17, 1961 Referring Provider: Dr. Arther Abbott  Encounter Date: 12/12/2015      PT End of Session - 12/12/15 0839    Visit Number 2   Number of Visits 19   Date for PT Re-Evaluation 01/09/16   Authorization Type BCBS    Authorization Time Period 12/10/2015 to 01/21/2016    PT Start Time 0830   PT Stop Time 0912   PT Time Calculation (min) 42 min   Activity Tolerance Patient tolerated treatment well   Behavior During Therapy [facility] for tasks assessed/performed      Past Medical History  Diagnosis Date  . Anxiety   . Hypertension   . Bronchitis   . Mixed hyperlipidemia   . Alcohol abuse   . Hepatitis C   . Renal disorder     states renal dysfunction, unable to tell staff what is wrong   . Chronic hip pain     Past Surgical History  Procedure Laterality Date  . Colonoscopy N/A 01/23/2014    SLF:The LEFT COLON IS EXTREMELY redundant/TWO RECTAL POLYPS REMOVED/SMALL VOLUME RECTAL BLEEDING MOST LIKELY DUE TO Small internal hemorrhoids  . Esophagogastroduodenoscopy N/A 01/23/2014    ONG:EXBMWUXL ring at the gastroesophageal junction/Medium sized hiatal hernia/NDYSPEPSIA MOST LIKELY DUE TO GERD/MILD Non-erosive gastritis  . Hemorrhoid banding  01/23/2014    Procedure: HEMORRHOID BANDING;  Surgeon: Danie Binder, MD;  Location: [facility];  Service: Endoscopy;;    There were no vitals filed for this visit.      Subjective Assessment - 12/12/15 0833    Subjective Pt 30 minutes late for apt today, reported he works until 8:00 every day.  Pt reports compliance with HEP without questions concerning exercises.  Currently c/o pain anterior proximal thigh pain scale 6/10   Pertinent History HTN    Patient Stated Goals Patient's goal is to reduce  his LBP and return to leisure activities such as dancing.    Currently in Pain? Yes   Pain Score 6    Pain Location Hip   Pain Orientation Right;Proximal;Anterior   Pain Descriptors / Indicators Aching   Pain Type Acute pain   Pain Radiating Towards down into the R thigh   Pain Onset More than a month ago   Pain Frequency Intermittent   Aggravating Factors  prolonged standing and sit to stand transition after sitting for a long period of time   Pain Relieving Factors Rest, sitting, and prescribed pain meds   Effect of Pain on Daily Activities Limits the patient's ability to complete leisure and recreational activities             [facility] Adult PT Treatment/Exercise - 12/12/15 0001    Exercises   Exercises Lumbar   Lumbar Exercises: Stretches   Active Hamstring Stretch 3 reps;30 seconds   Active Hamstring Stretch Limitations supine with rope   Piriformis Stretch 3 reps;30 seconds   Piriformis Stretch Limitations supine figure 4   Lumbar Exercises: Seated   Other Seated Lumbar Exercises Educated importance of proper posture   Lumbar Exercises: Supine   Ab Set 10 reps;5 seconds   AB Set Limitations TrA cueing with UE pressdown and posterior pelvic rotation   Bent Knee Raise 10 reps;5 seconds   Bridge 10 reps   Lumbar Exercises: Sidelying  Clam 10 reps;3 seconds   Clam Limitations with RTB            PT Short Term Goals - 12/10/15 1214    PT SHORT TERM GOAL #1   Title Patient will independently verbalize and demo proper completion of his initial HEP to continue with LE/core strengthening.   Time 2   Period Weeks   Status New   PT SHORT TERM GOAL #2   Title Patient will independently verbalize pain management strategies including use of cryotherapy and moist heat therapy in order to manage pain levels with ADLs and functional mobility activities.   Time 3   Period Weeks   Status New   PT SHORT TERM GOAL #3   Title Patient will report decreased max R-sided LBP to a  5/10 on a VAS in order to improve tolerance with work related activities.   Time 3   Period Weeks   Status New   PT SHORT TERM GOAL #4   Title Patient will demo improved postural awareness and core activation while squatting with lifting activities with no cues required.    Time 3   Period Weeks   Status New           PT Long Term Goals - 12/10/15 1214    PT LONG TERM GOAL #1   Title Patient will independently verbalize and demo proper completion of his advanced HEP to continue with LE/core strengthening and LE stretches once DC from PT.    Time 6   Period Weeks   Status New   PT LONG TERM GOAL #2   Title Patient will improve B hip and core strength to >4/5 MMT in order to be able to ambulate >45 minutes without difficulty.    Time 6   Period Weeks   Status New   PT LONG TERM GOAL #3   Title Patient will report decreased R-sided LBP and R hip pain to a 0-3/10 on a VAS in order to be able to return to his leisure activities including dancing.    Time 6   Period Weeks   Status New   PT LONG TERM GOAL #4   Title Patient will be able to stand at work for >2 hours with LBP rated <2/10 on a VAS in order to improve tolerance with work related activities and return to his PLOF.    Time 6   Period Weeks   Status New   PT LONG TERM GOAL #5   Title Patient will improve lumbar rotation AROM to WNL in order to improve mobility with recreational activities.    Time 6   Period Weeks   Status New               Plan - 12/12/15 8270    Clinical Impression Statement Pt 30 min late for apt today, discussed apt times and encouraged pt to reschedule apt so he can receive full session as he works til 8:00am.  Reviewed goals, complaince and assured correct technique with HEP and copy of eval given to pt.  Session focus on improve core stability wtih multimodal cueing to improve TrA activation and stretches to improve LE mobility.  Pt educated on importance of proper posture to reduce  stress on spinal musculature for pain relief.  End of session pt reports anterior thigh pain scale reduced to 4/10.     Rehab Potential Good   PT Frequency 3x / week   PT Duration 6 weeks  PT Treatment/Interventions ADLs/Self Care Home Management;Cryotherapy;Electrical Stimulation;Moist Heat;Ultrasound;Therapeutic activities;Therapeutic exercise;Neuromuscular re-education;Patient/family education;Manual techniques;Passive range of motion;Taping   PT Next Visit Plan Continue to focus on supine and seated core stabilization ther ex, piriformis/HS stretches, resisted clam shells in S/L position, and pt education regarding optimal posture in sitting/standing.    PT Home Exercise Plan Reviewed form and technique with current HEP initiated eval with addition of piriformis stretch, SKTC, and tr abdominis activation in supine.      Patient will benefit from skilled therapeutic intervention in order to improve the following deficits and impairments:  Abnormal gait, Decreased strength, Improper body mechanics, Increased muscle spasms, Postural dysfunction, Impaired flexibility, Decreased range of motion, Hypomobility, Pain  Visit Diagnosis: Right-sided low back pain without sciatica  Pain in right hip  Abnormal posture  Other abnormalities of gait and mobility  Muscle weakness (generalized)     Problem List Patient Active Problem List   Diagnosis Date Noted  . Hepatitis C 03/21/2015  . History of alcohol abuse 11/08/2013  . Tobacco abuse 11/08/2013  . Essential hypertension, benign 11/07/2013  . Abnormal ECG 11/07/2013   Ihor Austin, LPTA; [place]  Aldona Lento 12/12/2015, 10:12 AM   Garen Lah, PT, DPT  [facility] [address] [place], [place], [address] Phone: [phone]   Fax:  [phone]  Name: Mathew Lara MRN: [id number] Date of Birth: April 01, 1961   PHYSICAL THERAPY DISCHARGE SUMMARY  Visits from Start  of Care: 2  Current functional level related to goals / functional outcomes: Same as eval   Remaining deficits: Pain limited walking   Education / Equipment: HEP  Plan: Patient agrees to discharge.  Patient goals were not met. Patient is being discharged due to a change in medical status.  ?????       Rayetta Humphrey, [place] CLT [phone]

## 2015-12-15 ENCOUNTER — Encounter (HOSPITAL_COMMUNITY): Payer: Self-pay

## 2015-12-16 NOTE — Addendum Note (Signed)
Addended by: Romie Minus on: 12/16/2015 08:20 AM   Modules accepted: Orders

## 2015-12-17 ENCOUNTER — Encounter (HOSPITAL_COMMUNITY): Payer: Self-pay

## 2015-12-19 ENCOUNTER — Encounter (HOSPITAL_COMMUNITY): Payer: Self-pay

## 2015-12-22 ENCOUNTER — Ambulatory Visit (HOSPITAL_COMMUNITY): Payer: BLUE CROSS/BLUE SHIELD | Admitting: Physical Therapy

## 2015-12-22 ENCOUNTER — Telehealth (HOSPITAL_COMMUNITY): Payer: Self-pay

## 2015-12-22 NOTE — Telephone Encounter (Signed)
12/22/15 patient left a message that he needed to cx the 4/17 appt but no reason was given

## 2015-12-23 ENCOUNTER — Encounter: Payer: Self-pay | Admitting: Orthopedic Surgery

## 2015-12-23 ENCOUNTER — Ambulatory Visit (INDEPENDENT_AMBULATORY_CARE_PROVIDER_SITE_OTHER): Payer: BLUE CROSS/BLUE SHIELD | Admitting: Orthopedic Surgery

## 2015-12-23 VITALS — BP 115/83 | Ht 68.0 in | Wt 246.0 lb

## 2015-12-23 DIAGNOSIS — M8700 Idiopathic aseptic necrosis of unspecified bone: Secondary | ICD-10-CM | POA: Diagnosis not present

## 2015-12-23 DIAGNOSIS — M545 Low back pain, unspecified: Secondary | ICD-10-CM

## 2015-12-23 MED ORDER — HYDROCODONE-ACETAMINOPHEN 5-325 MG PO TABS: 1.0000 | ORAL_TABLET | Freq: Three times a day (TID) | ORAL | Status: DC | PRN

## 2015-12-23 MED ORDER — NABUMETONE 500 MG PO TABS: 500.0000 mg | ORAL_TABLET | Freq: Two times a day (BID) | ORAL | Status: DC

## 2015-12-23 NOTE — Progress Notes (Signed)
Chief Complaint  Patient presents with  . Follow-up    follow up back     Follow-up visit 55 year old male diagnosed with avascular necrosis of the hip but our evaluation revealed that his primary problem was his lumbar spine arthritis  he comes in much improved after physical therapy Relafen gabapentin and Norflex  Review of systems mild point pain intermittently  Physical Exam  Constitutional: He is oriented to person, place, and time. He appears well-developed and well-nourished. No distress.  Cardiovascular: Normal rate and intact distal pulses.   Musculoskeletal:  He has a slight "hitch" in his gitty-up. But he is walking much better and standing more upright  Neurological: He is alert and oriented to person, place, and time.  Skin: Skin is warm and dry. No rash noted. He is not diaphoretic. No erythema. No pallor.  Psychiatric: He has a normal mood and affect. His behavior is normal. Judgment and thought content normal.  Hip range of motion remains normal  Avascular necrosis of both hips  Lumbar spine arthritis  I will see him in 3 months x-rays hips are looked at his x-rays again he has spherical femoral heads but MRI shows avascular necrosis without subchondral fracture  Recommend continued monitoring. I advised him to call us if he gets severe groin pain  He will be a candidate for anterior approach hip

## 2015-12-24 ENCOUNTER — Telehealth (HOSPITAL_COMMUNITY): Payer: Self-pay | Admitting: Physical Therapy

## 2015-12-24 ENCOUNTER — Encounter (HOSPITAL_COMMUNITY): Payer: Self-pay | Admitting: Physical Therapy

## 2015-12-24 NOTE — Telephone Encounter (Signed)
Pt did not show for appointment.  Contacted patient who reports MD has taken him out of therapy at this time.  Message sent to evaluating therapist to discharge him from skilled care.  Teena Irani, PTA/CLT [phone]

## 2015-12-26 ENCOUNTER — Encounter (HOSPITAL_COMMUNITY): Payer: Self-pay

## 2015-12-29 ENCOUNTER — Encounter (HOSPITAL_COMMUNITY): Payer: Self-pay | Admitting: Physical Therapy

## 2015-12-31 ENCOUNTER — Encounter (HOSPITAL_COMMUNITY): Payer: Self-pay

## 2016-01-02 ENCOUNTER — Encounter (HOSPITAL_COMMUNITY): Payer: Self-pay

## 2016-01-05 ENCOUNTER — Encounter (HOSPITAL_COMMUNITY): Payer: Self-pay | Admitting: Physical Therapy

## 2016-01-07 ENCOUNTER — Encounter (HOSPITAL_COMMUNITY): Payer: Self-pay

## 2016-01-09 ENCOUNTER — Encounter (HOSPITAL_COMMUNITY): Payer: Self-pay

## 2016-01-12 ENCOUNTER — Encounter (HOSPITAL_COMMUNITY): Payer: Self-pay | Admitting: Physical Therapy

## 2016-01-14 ENCOUNTER — Encounter (HOSPITAL_COMMUNITY): Payer: Self-pay

## 2016-01-15 ENCOUNTER — Other Ambulatory Visit: Payer: Self-pay | Admitting: Orthopedic Surgery

## 2016-01-15 ENCOUNTER — Telehealth: Payer: Self-pay | Admitting: Orthopedic Surgery

## 2016-01-15 DIAGNOSIS — M8700 Idiopathic aseptic necrosis of unspecified bone: Secondary | ICD-10-CM

## 2016-01-15 MED ORDER — HYDROCODONE-ACETAMINOPHEN 5-325 MG PO TABS: 1.0000 | ORAL_TABLET | Freq: Three times a day (TID) | ORAL | Status: DC | PRN

## 2016-01-15 NOTE — Telephone Encounter (Addendum)
Patient wants refill on Norco 5-325 mgs.  Qty 30         Sig: Take 1 tablet by mouth every 8 (eight) hours as needed.        Patient also requests Relafen 500 mgs. Qty 60      Sig: Take 1 tablet (500 mg total) by mouth 2 (two) times daily

## 2016-01-15 NOTE — Telephone Encounter (Signed)
Routing to Dr Harrison for approval 

## 2016-01-16 ENCOUNTER — Encounter (HOSPITAL_COMMUNITY): Payer: Self-pay | Admitting: Physical Therapy

## 2016-03-23 ENCOUNTER — Ambulatory Visit (INDEPENDENT_AMBULATORY_CARE_PROVIDER_SITE_OTHER): Payer: BLUE CROSS/BLUE SHIELD | Admitting: Orthopedic Surgery

## 2016-03-23 ENCOUNTER — Ambulatory Visit (INDEPENDENT_AMBULATORY_CARE_PROVIDER_SITE_OTHER): Payer: BLUE CROSS/BLUE SHIELD

## 2016-03-23 VITALS — BP 126/90 | HR 91 | Ht 68.0 in | Wt 245.0 lb

## 2016-03-23 DIAGNOSIS — M8700 Idiopathic aseptic necrosis of unspecified bone: Secondary | ICD-10-CM

## 2016-03-23 NOTE — Progress Notes (Signed)
Chief Complaint  Patient presents with  . 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 Patient has significant inability to perform activities of daily living. He uses a cane when he is not working. However he is trying to work through this and is actually minimizing to be able to continue working  System review no chest pain or shortness of breath  Today's x-ray shows bilateral avascular necrosis joint space narrowing right greater than left abnormal shape of the femoral head increased sclerosis in the right femoral head  Bilateral avascular necrosis worsening   Patient will be referred to Dr. Ninfa Linden for anterior hip approach

## 2016-03-23 NOTE — Patient Instructions (Signed)
Will refer to Dr Ninfa Linden

## 2016-05-17 ENCOUNTER — Other Ambulatory Visit: Payer: Self-pay | Admitting: Physician Assistant

## 2016-05-28 ENCOUNTER — Encounter (HOSPITAL_COMMUNITY): Payer: Self-pay

## 2016-05-28 ENCOUNTER — Encounter (HOSPITAL_COMMUNITY)
Admission: RE | Admit: 2016-05-28 | Discharge: 2016-05-28 | Disposition: A | Discharge status: Home / Self Care | Payer: BLUE CROSS/BLUE SHIELD | Source: Ambulatory Visit | Attending: Orthopaedic Surgery | Admitting: Orthopaedic Surgery

## 2016-05-28 ENCOUNTER — Encounter (INDEPENDENT_AMBULATORY_CARE_PROVIDER_SITE_OTHER): Payer: Self-pay

## 2016-05-28 DIAGNOSIS — Z01812 Encounter for preprocedural laboratory examination: Secondary | ICD-10-CM | POA: Diagnosis not present

## 2016-05-28 DIAGNOSIS — Z96641 Presence of right artificial hip joint: Secondary | ICD-10-CM | POA: Insufficient documentation

## 2016-05-28 DIAGNOSIS — M87051 Idiopathic aseptic necrosis of right femur: Secondary | ICD-10-CM | POA: Insufficient documentation

## 2016-05-28 DIAGNOSIS — I10 Essential (primary) hypertension: Secondary | ICD-10-CM | POA: Insufficient documentation

## 2016-05-28 DIAGNOSIS — R9431 Abnormal electrocardiogram [ECG] [EKG]: Secondary | ICD-10-CM | POA: Insufficient documentation

## 2016-05-28 DIAGNOSIS — Z01818 Encounter for other preprocedural examination: Secondary | ICD-10-CM | POA: Insufficient documentation

## 2016-05-28 HISTORY — DX: Sleep apnea, unspecified: G47.30

## 2016-05-28 LAB — CBC
HEMATOCRIT: 36.1 % — AB (ref 39.0–52.0)
HEMOGLOBIN: 11.7 g/dL — AB (ref 13.0–17.0)
MCH: 27.3 pg (ref 26.0–34.0)
MCHC: 32.4 g/dL (ref 30.0–36.0)
MCV: 84.1 fL (ref 78.0–100.0)
Platelets: 269 10*3/uL (ref 150–400)
RBC: 4.29 MIL/uL (ref 4.22–5.81)
RDW: 15.4 % (ref 11.5–15.5)
WBC: 8.1 10*3/uL (ref 4.0–10.5)

## 2016-05-28 LAB — ABO/RH: ABO/RH(D): O POS

## 2016-05-28 LAB — SURGICAL PCR SCREEN
MRSA, PCR: NEGATIVE
STAPHYLOCOCCUS AUREUS: NEGATIVE

## 2016-05-28 NOTE — Patient Instructions (Addendum)
Mathew Lara  05/28/2016   Your procedure is scheduled on: 06/04/16  Report to [facility] Main  Entrance take [facility]  elevators to 3rd floor to  [place] at 5:30 AM.  Call this number if you have problems the morning of surgery [phone]   Remember: ONLY 1 PERSON MAY GO WITH YOU TO SHORT STAY TO GET  READY MORNING OF Mathew Lara.  Do not eat food or drink liquids :After Midnight.     Take these medicines the morning of surgery with A SIP OF WATER: Amlodipine (Norvasc), Tylenol if needed.  Please bring your CPAP mask and tubing with you day of surgery.                                You may not have any metal on your body including hair pins and              piercings  Do not wear jewelry, make-up, lotions, powders or perfumes, deodorant             Do not wear nail polish.  Do not shave  48 hours prior to surgery.              Men may shave face and neck.   Do not bring valuables to the hospital. [place].  Contacts, dentures or bridgework may not be worn into surgery.  Leave suitcase in the car. After surgery it may be brought to your room.               Please read over the following fact sheets you were given: _____________________________________________________________________             [facility] - Preparing for Surgery Before surgery, you can play an important role.  Because skin is not sterile, your skin needs to be as free of germs as possible.  You can reduce the number of germs on your skin by washing with CHG (chlorahexidine gluconate) soap before surgery.  CHG is an antiseptic cleaner which kills germs and bonds with the skin to continue killing germs even after washing. Please DO NOT use if you have an allergy to CHG or antibacterial soaps.  If your skin becomes reddened/irritated stop using the CHG and inform your nurse when you arrive at Short Stay. Do not shave (including  legs and underarms) for at least 48 hours prior to the first CHG shower.  You may shave your face/neck. Please follow these instructions carefully:  1.  Shower with CHG Soap the night before surgery and the  morning of Surgery.  2.  If you choose to wash your hair, wash your hair first as usual with your  normal  shampoo.  3.  After you shampoo, rinse your hair and body thoroughly to remove the  shampoo.                           4.  Use CHG as you would any other liquid soap.  You can apply chg directly  to the skin and wash                       Gently  with a scrungie or clean washcloth.  5.  Apply the CHG Soap to your body ONLY FROM THE NECK DOWN.   Do not use on face/ open                           Wound or open sores. Avoid contact with eyes, ears mouth and genitals (private parts).                       Wash face,  Genitals (private parts) with your normal soap.             6.  Wash thoroughly, paying special attention to the area where your surgery  will be performed.  7.  Thoroughly rinse your body with warm water from the neck down.  8.  DO NOT shower/wash with your normal soap after using and rinsing off  the CHG Soap.                9.  Pat yourself dry with a clean towel.            10.  Wear clean pajamas.            11.  Place clean sheets on your bed the night of your first shower and do not  sleep with pets. Day of Surgery : Do not apply any lotions/deodorants the morning of surgery.  Please wear clean clothes to the hospital/surgery center.  FAILURE TO FOLLOW THESE INSTRUCTIONS MAY RESULT IN THE CANCELLATION OF YOUR SURGERY PATIENT SIGNATURE_________________________________  NURSE SIGNATURE__________________________________  ________________________________________________________________________   Mathew Lara  An incentive spirometer is a tool that can help keep your lungs clear and active. This tool measures how well you are filling your lungs with each breath.  Taking long deep breaths may help reverse or decrease the chance of developing breathing (pulmonary) problems (especially infection) following:  A long period of time when you are unable to move or be active. BEFORE THE PROCEDURE   If the spirometer includes an indicator to show your best effort, your nurse or respiratory therapist will set it to a desired goal.  If possible, sit up straight or lean slightly forward. Try not to slouch.  Hold the incentive spirometer in an upright position. INSTRUCTIONS FOR USE  1. Sit on the edge of your bed if possible, or sit up as far as you can in bed or on a chair. 2. Hold the incentive spirometer in an upright position. 3. Breathe out normally. 4. Place the mouthpiece in your mouth and seal your lips tightly around it. 5. Breathe in slowly and as deeply as possible, raising the piston or the ball toward the top of the column. 6. Hold your breath for 3-5 seconds or for as long as possible. Allow the piston or ball to fall to the bottom of the column. 7. Remove the mouthpiece from your mouth and breathe out normally. 8. Rest for a few seconds and repeat Steps 1 through 7 at least 10 times every 1-2 hours when you are awake. Take your time and take a few normal breaths between deep breaths. 9. The spirometer may include an indicator to show your best effort. Use the indicator as a goal to work toward during each repetition. 10. After each set of 10 deep breaths, practice coughing to be sure your lungs are clear. If you have an incision (the cut made at the time of surgery), support  your incision when coughing by placing a pillow or rolled up towels firmly against it. Once you are able to get out of bed, walk around indoors and cough well. You may stop using the incentive spirometer when instructed by your caregiver.  RISKS AND COMPLICATIONS  Take your time so you do not get dizzy or light-headed.  If you are in pain, you may need to take or ask for pain  medication before doing incentive spirometry. It is harder to take a deep breath if you are having pain. AFTER USE  Rest and breathe slowly and easily.  It can be helpful to keep track of a log of your progress. Your caregiver can provide you with a simple table to help with this. If you are using the spirometer at home, follow these instructions: [place] IF:   You are having difficultly using the spirometer.  You have trouble using the spirometer as often as instructed.  Your pain medication is not giving enough relief while using the spirometer.  You develop fever of 100.5 F (38.1 C) or higher. SEEK IMMEDIATE MEDICAL CARE IF:   You cough up bloody sputum that had not been present before.  You develop fever of 102 F (38.9 C) or greater.  You develop worsening pain at or near the incision site. MAKE SURE YOU:   Understand these instructions.  Will watch your condition.  Will get help right away if you are not doing well or get worse. Document Released: 01/03/2007 Document Revised: 11/15/2011 Document Reviewed: 03/06/2007 Exit[employer] Patient Information 2014 Exit[employer], [place].   ________________________________________________________________________  WHAT IS A BLOOD TRANSFUSION? Blood Transfusion Information  A transfusion is the replacement of blood or some of its parts. Blood is made up of multiple cells which provide different functions.  Red blood cells carry oxygen and are used for blood loss replacement.  White blood cells fight against infection.  Platelets control bleeding.  Plasma helps clot blood.  Other blood products are available for specialized needs, such as hemophilia or other clotting disorders. BEFORE THE TRANSFUSION  Who gives blood for transfusions?   Healthy volunteers who are fully evaluated to make sure their blood is safe. This is blood bank blood. Transfusion therapy is the safest it has ever been in the practice of medicine.  Before blood is taken from a donor, a complete history is taken to make sure that person has no history of diseases nor engages in risky social behavior (examples are intravenous drug use or sexual activity with multiple partners). The donor's travel history is screened to minimize risk of transmitting infections, such as malaria. The donated blood is tested for signs of infectious diseases, such as HIV and hepatitis. The blood is then tested to be sure it is compatible with you in order to minimize the chance of a transfusion reaction. If you or a relative donates blood, this is often done in anticipation of surgery and is not appropriate for emergency situations. It takes many days to process the donated blood. RISKS AND COMPLICATIONS Although transfusion therapy is very safe and saves many lives, the main dangers of transfusion include:   Getting an infectious disease.  Developing a transfusion reaction. This is an allergic reaction to something in the blood you were given. Every precaution is taken to prevent this. The decision to have a blood transfusion has been considered carefully by your caregiver before blood is given. Blood is not given unless the benefits outweigh the risks. AFTER THE TRANSFUSION  Right after receiving a blood transfusion, you will usually feel much better and more energetic. This is especially true if your red blood cells have gotten low (anemic). The transfusion raises the level of the red blood cells which carry oxygen, and this usually causes an energy increase.  The nurse administering the transfusion will monitor you carefully for complications. HOME CARE INSTRUCTIONS  No special instructions are needed after a transfusion. You may find your energy is better. Speak with your caregiver about any limitations on activity for underlying diseases you may have. SEEK MEDICAL CARE IF:   Your condition is not improving after your transfusion.  You develop redness or  irritation at the intravenous (IV) site. SEEK IMMEDIATE MEDICAL CARE IF:  Any of the following symptoms occur over the next 12 hours:  Shaking chills.  You have a temperature by mouth above 102 F (38.9 C), not controlled by medicine.  Chest, back, or muscle pain.  People around you feel you are not acting correctly or are confused.  Shortness of breath or difficulty breathing.  Dizziness and fainting.  You get a rash or develop hives.  You have a decrease in urine output.  Your urine turns a dark color or changes to pink, red, or brown. Any of the following symptoms occur over the next 10 days:  You have a temperature by mouth above 102 F (38.9 C), not controlled by medicine.  Shortness of breath.  Weakness after normal activity.  The white part of the eye turns yellow (jaundice).  You have a decrease in the amount of urine or are urinating less often.  Your urine turns a dark color or changes to pink, red, or brown. Document Released: 08/20/2000 Document Revised: 11/15/2011 Document Reviewed: 04/08/2008 [facility] Patient Information 2014 [place], [place].  _______________________________________________________________________

## 2016-06-01 ENCOUNTER — Other Ambulatory Visit (HOSPITAL_COMMUNITY): Payer: Self-pay | Admitting: Anesthesiology

## 2016-06-02 ENCOUNTER — Encounter (HOSPITAL_COMMUNITY)
Admission: RE | Admit: 2016-06-02 | Discharge: 2016-06-02 | Disposition: A | Discharge status: Home / Self Care | Payer: BLUE CROSS/BLUE SHIELD | Source: Ambulatory Visit | Attending: Orthopaedic Surgery | Admitting: Orthopaedic Surgery

## 2016-06-02 LAB — COMPREHENSIVE METABOLIC PANEL
ALBUMIN: 3.9 g/dL (ref 3.5–5.0)
ALT: 17 U/L (ref 17–63)
AST: 17 U/L (ref 15–41)
Alkaline Phosphatase: 112 U/L (ref 38–126)
Anion gap: 10 (ref 5–15)
BUN: 36 mg/dL — AB (ref 6–20)
CHLORIDE: 104 mmol/L (ref 101–111)
CO2: 23 mmol/L (ref 22–32)
CREATININE: 2.53 mg/dL — AB (ref 0.61–1.24)
Calcium: 9.6 mg/dL (ref 8.9–10.3)
GFR calc non Af Amer: 27 mL/min — ABNORMAL LOW (ref >60)
GFR, EST AFRICAN AMERICAN: 31 mL/min — AB (ref >60)
GLUCOSE: 103 mg/dL — AB (ref 65–99)
POTASSIUM: 4.4 mmol/L (ref 3.5–5.1)
SODIUM: 137 mmol/L (ref 135–145)
Total Bilirubin: 0.6 mg/dL (ref 0.3–1.2)
Total Protein: 9.5 g/dL — ABNORMAL HIGH (ref 6.5–8.1)

## 2016-06-04 ENCOUNTER — Inpatient Hospital Stay (HOSPITAL_COMMUNITY): Payer: BLUE CROSS/BLUE SHIELD | Admitting: Certified Registered Nurse Anesthetist

## 2016-06-04 ENCOUNTER — Inpatient Hospital Stay (HOSPITAL_COMMUNITY)
Admission: RE | Admit: 2016-06-04 | Discharge: 2016-06-06 | DRG: 470 | Disposition: A | Discharge status: Home Health | Payer: BLUE CROSS/BLUE SHIELD | Source: Ambulatory Visit | Attending: Orthopaedic Surgery | Admitting: Orthopaedic Surgery

## 2016-06-04 ENCOUNTER — Encounter (HOSPITAL_COMMUNITY)
Admission: RE | Disposition: A | Discharge status: Home Health | Payer: Self-pay | Source: Ambulatory Visit | Attending: Orthopaedic Surgery

## 2016-06-04 ENCOUNTER — Encounter (HOSPITAL_COMMUNITY): Payer: Self-pay | Admitting: *Deleted

## 2016-06-04 ENCOUNTER — Inpatient Hospital Stay (HOSPITAL_COMMUNITY): Payer: BLUE CROSS/BLUE SHIELD

## 2016-06-04 DIAGNOSIS — B1920 Unspecified viral hepatitis C without hepatic coma: Secondary | ICD-10-CM | POA: Diagnosis present

## 2016-06-04 DIAGNOSIS — I10 Essential (primary) hypertension: Secondary | ICD-10-CM | POA: Diagnosis present

## 2016-06-04 DIAGNOSIS — F1010 Alcohol abuse, uncomplicated: Secondary | ICD-10-CM | POA: Diagnosis present

## 2016-06-04 DIAGNOSIS — Z0183 Encounter for blood typing: Secondary | ICD-10-CM | POA: Diagnosis not present

## 2016-06-04 DIAGNOSIS — N289 Disorder of kidney and ureter, unspecified: Secondary | ICD-10-CM | POA: Diagnosis present

## 2016-06-04 DIAGNOSIS — M25551 Pain in right hip: Secondary | ICD-10-CM | POA: Diagnosis present

## 2016-06-04 DIAGNOSIS — G4730 Sleep apnea, unspecified: Secondary | ICD-10-CM | POA: Diagnosis present

## 2016-06-04 DIAGNOSIS — E782 Mixed hyperlipidemia: Secondary | ICD-10-CM | POA: Diagnosis present

## 2016-06-04 DIAGNOSIS — M8738 Other secondary osteonecrosis, other site: Secondary | ICD-10-CM | POA: Diagnosis present

## 2016-06-04 DIAGNOSIS — Z791 Long term (current) use of non-steroidal anti-inflammatories (NSAID): Secondary | ICD-10-CM

## 2016-06-04 DIAGNOSIS — M87351 Other secondary osteonecrosis, right femur: Secondary | ICD-10-CM | POA: Diagnosis present

## 2016-06-04 DIAGNOSIS — Z7982 Long term (current) use of aspirin: Secondary | ICD-10-CM | POA: Diagnosis not present

## 2016-06-04 DIAGNOSIS — Z01812 Encounter for preprocedural laboratory examination: Secondary | ICD-10-CM

## 2016-06-04 DIAGNOSIS — M87059 Idiopathic aseptic necrosis of unspecified femur: Secondary | ICD-10-CM | POA: Insufficient documentation

## 2016-06-04 DIAGNOSIS — Z01810 Encounter for preprocedural cardiovascular examination: Secondary | ICD-10-CM

## 2016-06-04 DIAGNOSIS — Z87891 Personal history of nicotine dependence: Secondary | ICD-10-CM

## 2016-06-04 DIAGNOSIS — Z6838 Body mass index (BMI) 38.0-38.9, adult: Secondary | ICD-10-CM

## 2016-06-04 DIAGNOSIS — M87051 Idiopathic aseptic necrosis of right femur: Secondary | ICD-10-CM

## 2016-06-04 DIAGNOSIS — Z96641 Presence of right artificial hip joint: Secondary | ICD-10-CM

## 2016-06-04 HISTORY — PX: TOTAL HIP ARTHROPLASTY: SHX124

## 2016-06-04 LAB — TYPE AND SCREEN
ABO/RH(D): O POS
ANTIBODY SCREEN: NEGATIVE

## 2016-06-04 SURGERY — ARTHROPLASTY, HIP, TOTAL, ANTERIOR APPROACH
Anesthesia: General | Site: Hip | Laterality: Right

## 2016-06-04 MED ORDER — SUGAMMADEX SODIUM 200 MG/2ML IV SOLN
INTRAVENOUS | Status: DC | PRN
  Administered 2016-06-04: 200 mg via INTRAVENOUS

## 2016-06-04 MED ORDER — SODIUM CHLORIDE 0.9 % IV SOLN
INTRAVENOUS | Status: DC
  Administered 2016-06-04 – 2016-06-05 (×3): via INTRAVENOUS

## 2016-06-04 MED ORDER — FENTANYL CITRATE (PF) 100 MCG/2ML IJ SOLN
INTRAMUSCULAR | Status: AC
  Filled 2016-06-04: qty 2

## 2016-06-04 MED ORDER — CHLORHEXIDINE GLUCONATE 4 % EX LIQD: 60.0000 mL | Freq: Once | CUTANEOUS | Status: DC

## 2016-06-04 MED ORDER — LOSARTAN POTASSIUM 50 MG PO TABS
50.0000 mg | ORAL_TABLET | Freq: Every day | ORAL | Status: DC
  Administered 2016-06-04 – 2016-06-06 (×3): 50 mg via ORAL
  Filled 2016-06-04 (×4): qty 1

## 2016-06-04 MED ORDER — HYDROMORPHONE HCL 1 MG/ML IJ SOLN
1.0000 mg | INTRAMUSCULAR | Status: DC | PRN
Start: 2016-06-04 — End: 2016-06-06
  Administered 2016-06-04: 1 mg via INTRAVENOUS
  Filled 2016-06-04: qty 1

## 2016-06-04 MED ORDER — ACETAMINOPHEN 650 MG RE SUPP: 650.0000 mg | Freq: Four times a day (QID) | RECTAL | Status: DC | PRN

## 2016-06-04 MED ORDER — CEFAZOLIN IN D5W 1 GM/50ML IV SOLN
1.0000 g | Freq: Four times a day (QID) | INTRAVENOUS | Status: AC
  Administered 2016-06-04 (×2): 1 g via INTRAVENOUS
  Filled 2016-06-04 (×2): qty 50

## 2016-06-04 MED ORDER — ACETAMINOPHEN 325 MG PO TABS
650.0000 mg | ORAL_TABLET | Freq: Four times a day (QID) | ORAL | Status: DC | PRN
  Administered 2016-06-05 (×2): 650 mg via ORAL
  Filled 2016-06-04 (×2): qty 2

## 2016-06-04 MED ORDER — PROPOFOL 10 MG/ML IV BOLUS
INTRAVENOUS | Status: AC
  Filled 2016-06-04: qty 60

## 2016-06-04 MED ORDER — LIDOCAINE 2% (20 MG/ML) 5 ML SYRINGE
INTRAMUSCULAR | Status: DC | PRN
  Administered 2016-06-04: 100 mg via INTRAVENOUS

## 2016-06-04 MED ORDER — LABETALOL HCL 5 MG/ML IV SOLN
INTRAVENOUS | Status: AC
  Filled 2016-06-04: qty 4

## 2016-06-04 MED ORDER — OXYCODONE HCL 5 MG PO TABS
5.0000 mg | ORAL_TABLET | ORAL | Status: DC | PRN
  Administered 2016-06-04: 5 mg via ORAL
  Administered 2016-06-04 – 2016-06-06 (×8): 10 mg via ORAL
  Filled 2016-06-04 (×4): qty 2
  Filled 2016-06-04: qty 1
  Filled 2016-06-04 (×4): qty 2

## 2016-06-04 MED ORDER — CEFAZOLIN SODIUM-DEXTROSE 2-4 GM/100ML-% IV SOLN
2.0000 g | INTRAVENOUS | Status: AC
  Administered 2016-06-04: 2 g via INTRAVENOUS
  Filled 2016-06-04: qty 100

## 2016-06-04 MED ORDER — ONDANSETRON HCL 4 MG/2ML IJ SOLN: 4.0000 mg | Freq: Four times a day (QID) | INTRAMUSCULAR | Status: DC | PRN

## 2016-06-04 MED ORDER — MEPERIDINE HCL 50 MG/ML IJ SOLN: 6.2500 mg | INTRAMUSCULAR | Status: DC | PRN

## 2016-06-04 MED ORDER — TRANEXAMIC ACID 1000 MG/10ML IV SOLN
1000.0000 mg | INTRAVENOUS | Status: AC
  Administered 2016-06-04: 1000 mg via INTRAVENOUS
  Filled 2016-06-04: qty 1100

## 2016-06-04 MED ORDER — ONDANSETRON HCL 4 MG/2ML IJ SOLN
INTRAMUSCULAR | Status: DC | PRN
  Administered 2016-06-04: 4 mg via INTRAVENOUS

## 2016-06-04 MED ORDER — PHENOL 1.4 % MT LIQD
1.0000 | OROMUCOSAL | Status: DC | PRN
  Filled 2016-06-04: qty 177

## 2016-06-04 MED ORDER — LACTATED RINGERS IV SOLN
INTRAVENOUS | Status: DC | PRN
  Administered 2016-06-04 (×3): via INTRAVENOUS

## 2016-06-04 MED ORDER — DEXAMETHASONE SODIUM PHOSPHATE 10 MG/ML IJ SOLN
INTRAMUSCULAR | Status: AC
  Filled 2016-06-04: qty 1

## 2016-06-04 MED ORDER — DOCUSATE SODIUM 100 MG PO CAPS
100.0000 mg | ORAL_CAPSULE | Freq: Two times a day (BID) | ORAL | Status: DC
  Administered 2016-06-04 – 2016-06-06 (×4): 100 mg via ORAL
  Filled 2016-06-04 (×4): qty 1

## 2016-06-04 MED ORDER — SODIUM CHLORIDE 0.9 % IR SOLN
Status: DC | PRN
  Administered 2016-06-04: 1000 mL

## 2016-06-04 MED ORDER — DIPHENHYDRAMINE HCL 12.5 MG/5ML PO ELIX: 12.5000 mg | ORAL_SOLUTION | ORAL | Status: DC | PRN

## 2016-06-04 MED ORDER — ALUM & MAG HYDROXIDE-SIMETH 200-200-20 MG/5ML PO SUSP: 30.0000 mL | ORAL | Status: DC | PRN

## 2016-06-04 MED ORDER — HYDROMORPHONE HCL 2 MG/ML IJ SOLN
INTRAMUSCULAR | Status: AC
  Filled 2016-06-04: qty 1

## 2016-06-04 MED ORDER — ROCURONIUM BROMIDE 10 MG/ML (PF) SYRINGE
PREFILLED_SYRINGE | INTRAVENOUS | Status: DC | PRN
  Administered 2016-06-04: 40 mg via INTRAVENOUS

## 2016-06-04 MED ORDER — HYDROMORPHONE HCL 1 MG/ML IJ SOLN
0.2500 mg | INTRAMUSCULAR | Status: DC | PRN
  Administered 2016-06-04: 0.5 mg via INTRAVENOUS

## 2016-06-04 MED ORDER — MIDAZOLAM HCL 5 MG/5ML IJ SOLN
INTRAMUSCULAR | Status: DC | PRN
  Administered 2016-06-04: 2 mg via INTRAVENOUS

## 2016-06-04 MED ORDER — ONDANSETRON HCL 4 MG/2ML IJ SOLN: 4.0000 mg | Freq: Once | INTRAMUSCULAR | Status: DC | PRN

## 2016-06-04 MED ORDER — METOCLOPRAMIDE HCL 5 MG/ML IJ SOLN: 5.0000 mg | Freq: Three times a day (TID) | INTRAMUSCULAR | Status: DC | PRN

## 2016-06-04 MED ORDER — PHENYLEPHRINE 40 MCG/ML (10ML) SYRINGE FOR IV PUSH (FOR BLOOD PRESSURE SUPPORT)
PREFILLED_SYRINGE | INTRAVENOUS | Status: DC | PRN
  Administered 2016-06-04: 40 ug via INTRAVENOUS

## 2016-06-04 MED ORDER — DEXAMETHASONE SODIUM PHOSPHATE 10 MG/ML IJ SOLN
INTRAMUSCULAR | Status: DC | PRN
  Administered 2016-06-04: 10 mg via INTRAVENOUS

## 2016-06-04 MED ORDER — FENTANYL CITRATE (PF) 100 MCG/2ML IJ SOLN
INTRAMUSCULAR | Status: DC | PRN
  Administered 2016-06-04: 50 ug via INTRAVENOUS
  Administered 2016-06-04: 100 ug via INTRAVENOUS
  Administered 2016-06-04: 50 ug via INTRAVENOUS

## 2016-06-04 MED ORDER — ASPIRIN 81 MG PO CHEW
81.0000 mg | CHEWABLE_TABLET | Freq: Two times a day (BID) | ORAL | Status: DC
  Administered 2016-06-04 – 2016-06-06 (×4): 81 mg via ORAL
  Filled 2016-06-04 (×4): qty 1

## 2016-06-04 MED ORDER — HYDROMORPHONE HCL 1 MG/ML IJ SOLN
INTRAMUSCULAR | Status: AC
  Filled 2016-06-04: qty 1

## 2016-06-04 MED ORDER — METHOCARBAMOL 1000 MG/10ML IJ SOLN
500.0000 mg | Freq: Four times a day (QID) | INTRAVENOUS | Status: DC | PRN
  Administered 2016-06-04: 500 mg via INTRAVENOUS
  Filled 2016-06-04: qty 5
  Filled 2016-06-04: qty 550

## 2016-06-04 MED ORDER — ZOLPIDEM TARTRATE 5 MG PO TABS: 5.0000 mg | ORAL_TABLET | Freq: Every evening | ORAL | Status: DC | PRN

## 2016-06-04 MED ORDER — CEFAZOLIN SODIUM-DEXTROSE 2-4 GM/100ML-% IV SOLN
INTRAVENOUS | Status: AC
  Filled 2016-06-04: qty 100

## 2016-06-04 MED ORDER — HYDROMORPHONE HCL 1 MG/ML IJ SOLN
INTRAMUSCULAR | Status: DC | PRN
  Administered 2016-06-04: 0.5 mg via INTRAVENOUS
  Administered 2016-06-04: 1 mg via INTRAVENOUS
  Administered 2016-06-04: 0.5 mg via INTRAVENOUS

## 2016-06-04 MED ORDER — PROPOFOL 10 MG/ML IV BOLUS
INTRAVENOUS | Status: DC | PRN
  Administered 2016-06-04: 200 mg via INTRAVENOUS

## 2016-06-04 MED ORDER — OXYCODONE HCL 5 MG/5ML PO SOLN
5.0000 mg | Freq: Once | ORAL | Status: DC | PRN
  Filled 2016-06-04: qty 5

## 2016-06-04 MED ORDER — SUGAMMADEX SODIUM 200 MG/2ML IV SOLN
INTRAVENOUS | Status: AC
  Filled 2016-06-04: qty 2

## 2016-06-04 MED ORDER — METHOCARBAMOL 500 MG PO TABS
500.0000 mg | ORAL_TABLET | Freq: Four times a day (QID) | ORAL | Status: DC | PRN
  Administered 2016-06-04 – 2016-06-06 (×4): 500 mg via ORAL
  Filled 2016-06-04 (×5): qty 1

## 2016-06-04 MED ORDER — PHENYLEPHRINE 40 MCG/ML (10ML) SYRINGE FOR IV PUSH (FOR BLOOD PRESSURE SUPPORT)
PREFILLED_SYRINGE | INTRAVENOUS | Status: AC
  Filled 2016-06-04: qty 10

## 2016-06-04 MED ORDER — METOCLOPRAMIDE HCL 5 MG PO TABS: 5.0000 mg | ORAL_TABLET | Freq: Three times a day (TID) | ORAL | Status: DC | PRN

## 2016-06-04 MED ORDER — SUCCINYLCHOLINE CHLORIDE 200 MG/10ML IV SOSY
PREFILLED_SYRINGE | INTRAVENOUS | Status: DC | PRN
  Administered 2016-06-04: 140 mg via INTRAVENOUS

## 2016-06-04 MED ORDER — ONDANSETRON HCL 4 MG PO TABS: 4.0000 mg | ORAL_TABLET | Freq: Four times a day (QID) | ORAL | Status: DC | PRN

## 2016-06-04 MED ORDER — OXYCODONE HCL 5 MG PO TABS: 5.0000 mg | ORAL_TABLET | Freq: Once | ORAL | Status: DC | PRN

## 2016-06-04 MED ORDER — ONDANSETRON HCL 4 MG/2ML IJ SOLN
INTRAMUSCULAR | Status: AC
  Filled 2016-06-04: qty 2

## 2016-06-04 MED ORDER — MIDAZOLAM HCL 2 MG/2ML IJ SOLN
INTRAMUSCULAR | Status: AC
  Filled 2016-06-04: qty 2

## 2016-06-04 MED ORDER — AMLODIPINE BESYLATE 10 MG PO TABS
10.0000 mg | ORAL_TABLET | Freq: Every day | ORAL | Status: DC
  Administered 2016-06-05 – 2016-06-06 (×2): 10 mg via ORAL
  Filled 2016-06-04 (×2): qty 1

## 2016-06-04 MED ORDER — LABETALOL HCL 5 MG/ML IV SOLN
5.0000 mg | INTRAVENOUS | Status: DC | PRN
  Administered 2016-06-04 (×2): 5 mg via INTRAVENOUS

## 2016-06-04 MED ORDER — LACTATED RINGERS IV SOLN: INTRAVENOUS | Status: DC

## 2016-06-04 MED ORDER — MENTHOL 3 MG MT LOZG: 1.0000 | LOZENGE | OROMUCOSAL | Status: DC | PRN

## 2016-06-04 SURGICAL SUPPLY — 35 items
BAG ZIPLOCK 12X15 (MISCELLANEOUS) IMPLANT
BENZOIN TINCTURE PRP APPL 2/3 (GAUZE/BANDAGES/DRESSINGS) IMPLANT
BLADE 10 SAFETY STRL DISP (BLADE) ×2 IMPLANT
BLADE SAW SGTL 18X1.27X75 (BLADE) ×2 IMPLANT
CAPT HIP TOTAL 2 ×2 IMPLANT
CELLS DAT CNTRL 66122 CELL SVR (MISCELLANEOUS) ×1 IMPLANT
CLOTH BEACON ORANGE TIMEOUT ST (SAFETY) ×2 IMPLANT
DRAPE STERI IOBAN 125X83 (DRAPES) ×2 IMPLANT
DRAPE U-SHAPE 47X51 STRL (DRAPES) ×4 IMPLANT
DRSG AQUACEL AG ADV 3.5X10 (GAUZE/BANDAGES/DRESSINGS) ×2 IMPLANT
DURAPREP 26ML APPLICATOR (WOUND CARE) ×2 IMPLANT
ELECT REM PT RETURN 9FT ADLT (ELECTROSURGICAL) ×2
ELECTRODE REM PT RTRN 9FT ADLT (ELECTROSURGICAL) ×1 IMPLANT
GAUZE XEROFORM 1X8 LF (GAUZE/BANDAGES/DRESSINGS) ×2 IMPLANT
GLOVE BIO SURGEON STRL SZ7.5 (GLOVE) ×2 IMPLANT
GLOVE BIOGEL PI IND STRL 8 (GLOVE) ×2 IMPLANT
GLOVE BIOGEL PI INDICATOR 8 (GLOVE) ×2
GLOVE ECLIPSE 8.0 STRL XLNG CF (GLOVE) ×2 IMPLANT
GOWN STRL REUS W/TWL XL LVL3 (GOWN DISPOSABLE) ×4 IMPLANT
HANDPIECE INTERPULSE COAX TIP (DISPOSABLE) ×1
HEAD CERAMIC DELTA 36 PLUS 1.5 (Hips) ×2 IMPLANT
HOLDER FOLEY CATH W/STRAP (MISCELLANEOUS) ×2 IMPLANT
PACK ANTERIOR HIP CUSTOM (KITS) ×2 IMPLANT
RTRCTR WOUND ALEXIS 18CM MED (MISCELLANEOUS) ×2
SET HNDPC FAN SPRY TIP SCT (DISPOSABLE) ×1 IMPLANT
STAPLER VISISTAT 35W (STAPLE) IMPLANT
STRIP CLOSURE SKIN 1/2X4 (GAUZE/BANDAGES/DRESSINGS) IMPLANT
SUT ETHIBOND NAB CT1 #1 30IN (SUTURE) ×2 IMPLANT
SUT MNCRL AB 4-0 PS2 18 (SUTURE) IMPLANT
SUT VIC AB 0 CT1 36 (SUTURE) ×2 IMPLANT
SUT VIC AB 1 CT1 36 (SUTURE) ×2 IMPLANT
SUT VIC AB 2-0 CT1 27 (SUTURE) ×2
SUT VIC AB 2-0 CT1 TAPERPNT 27 (SUTURE) ×2 IMPLANT
TRAY FOLEY W/METER SILVER 16FR (SET/KITS/TRAYS/PACK) ×2 IMPLANT
YANKAUER SUCT BULB TIP NO VENT (SUCTIONS) ×2 IMPLANT

## 2016-06-04 NOTE — Anesthesia Postprocedure Evaluation (Signed)
Anesthesia Post Note  Patient: Mathew Lara  Procedure(s) Performed: Procedure(s) (LRB): RIGHT TOTAL HIP ARTHROPLASTY ANTERIOR APPROACH (Right)  Patient location during evaluation: PACU Anesthesia Type: General Level of consciousness: awake and alert Pain management: pain level controlled Vital Signs Assessment: post-procedure vital signs reviewed and stable Respiratory status: spontaneous breathing, nonlabored ventilation and respiratory function stable Cardiovascular status: blood pressure returned to baseline and stable Postop Assessment: no signs of nausea or vomiting Anesthetic complications: no Comments: Pt on CPAP in PACU to support maintaining Sp02. Tolerating well.    Last Vitals:  Vitals:   06/04/16 1210 06/04/16 1215  BP: (!) 129/92 126/85  Pulse: 96 97  Resp: 19 12  Temp:      Last Pain:  Vitals:   06/04/16 1206  TempSrc:   PainSc: 6                  Khristie Sak A

## 2016-06-04 NOTE — Anesthesia Procedure Notes (Signed)
Procedure Name: Intubation Date/Time: 06/04/2016 7:38 AM Performed by: Montel Clock Pre-anesthesia Checklist: Patient identified, Emergency Drugs available, Suction available, Patient being monitored and Timeout performed Patient Re-evaluated:Patient Re-evaluated prior to inductionOxygen Delivery Method: Circle system utilized Preoxygenation: Pre-oxygenation with 100% oxygen Intubation Type: IV induction Ventilation: Mask ventilation without difficulty and Oral airway inserted - appropriate to patient size Laryngoscope Size: Mac and 3 Grade View: Grade I Tube type: Oral Tube size: 7.5 mm Number of attempts: 1 Airway Equipment and Method: Stylet Placement Confirmation: ETT inserted through vocal cords under direct vision,  positive ETCO2 and breath sounds checked- equal and bilateral Secured at: 23 cm Tube secured with: Tape Dental Injury: Teeth and Oropharynx as per pre-operative assessment

## 2016-06-04 NOTE — Progress Notes (Signed)
Losartan 50mg  P.O. Given as ordered.

## 2016-06-04 NOTE — Evaluation (Signed)
Physical Therapy Evaluation Patient Details Name: Mathew Lara MRN: [id number] DOB: [birth year] Today's Date: 06/04/2016   History of Present Illness  Pt s/p R THR 2* avn  Clinical Impression  Pt s/p R THR presents with decreased R LE strength/ROM, post op pain and obesity limiting functional mobility.  Pt should progress to dc home with family assist and HHPT follow up.    Follow Up Recommendations Home health PT    Equipment Recommendations  None recommended by PT    Recommendations for Other Services OT consult     Precautions / Restrictions Precautions Precautions: Fall Restrictions Weight Bearing Restrictions: No Other Position/Activity Restrictions: WBAT      Mobility  Bed Mobility Overal bed mobility: Needs Assistance Bed Mobility: Supine to Sit     Supine to sit: Min assist;Mod assist     General bed mobility comments: cues for sequence and use of L LE to self assist  Transfers Overall transfer level: Needs assistance Equipment used: Rolling walker (2 wheeled) Transfers: Sit to/from Stand Sit to Stand: Min assist;From elevated surface         General transfer comment: cues for LE management and use of UEs to self assist  Ambulation/Gait Ambulation/Gait assistance: Min assist Ambulation Distance (Feet): 52 Feet Assistive device: Rolling walker (2 wheeled) Gait Pattern/deviations: Step-to pattern;Step-through pattern;Decreased step length - right;Decreased step length - left;Shuffle;Trunk flexed Gait velocity: decr Gait velocity interpretation: Below normal speed for age/gender General Gait Details: cues for posture, position from RW and initial sequence  Stairs            Wheelchair Mobility    Modified Rankin (Stroke Patients Only)       Balance                                             Pertinent Vitals/Pain Pain Assessment: 0-10 Pain Score: 6  Pain Location: R hip Pain Descriptors / Indicators:  Aching;Sore Pain Intervention(s): Limited activity within patient's tolerance;Premedicated before session;Monitored during session;Ice applied    Home Living Family/patient expects to be discharged to:: Private residence Living Arrangements: Spouse/significant other Available Help at Discharge: Family Type of Home: House Home Access: Stairs to enter Entrance Stairs-Rails: Right Entrance Stairs-Number of Steps: 7+7 Home Layout: Able to live on main level with bedroom/bathroom Home Equipment: Walker - 2 wheels;Cane - single point;Bedside commode      Prior Function Level of Independence: Independent;Independent with assistive device(s)         Comments: Used cane prn     Hand Dominance        Extremity/Trunk Assessment   Upper Extremity Assessment: Overall WFL for tasks assessed           Lower Extremity Assessment: RLE deficits/detail      Cervical / Trunk Assessment: Normal  Communication   Communication: No difficulties  Cognition Arousal/Alertness: Awake/alert Behavior During Therapy: WFL for tasks assessed/performed Overall Cognitive Status: Within Functional Limits for tasks assessed                      General Comments      Exercises Total Joint Exercises Ankle Circles/Pumps: AROM;Both;15 reps;Supine   Assessment/Plan    PT Assessment Patient needs continued PT services  PT Problem List Decreased strength;Decreased range of motion;Decreased activity tolerance;Decreased balance;Decreased mobility;Decreased knowledge of use of DME;Obesity;Pain  PT Treatment Interventions DME instruction;Gait training;Stair training;Functional mobility training;Therapeutic activities;Therapeutic exercise;Patient/family education    PT Goals (Current goals can be found in the Care Plan section)  Acute Rehab PT Goals Patient Stated Goal: HOme by Sunday PT Goal Formulation: With patient Time For Goal Achievement: 06/09/16 Potential to Achieve  Goals: Good    Frequency 7X/week   Barriers to discharge        Co-evaluation               End of Session Equipment Utilized During Treatment: Gait belt Activity Tolerance: Patient tolerated treatment well Patient left: in chair;with call bell/phone within reach;with chair alarm set Nurse Communication: Mobility status         Time: 1537-1600 PT Time Calculation (min) (ACUTE ONLY): 23 min   Charges:   PT Evaluation $PT Eval Low Complexity: 1 Procedure PT Treatments $Gait Training: 8-22 mins   PT G Codes:        Keenya Matera 06-08-2016, 5:01 PM

## 2016-06-04 NOTE — Progress Notes (Signed)
Patient declines the use of nocturnal CPAP tonight. Equipment remains at the bedside. RT will continue to follow.

## 2016-06-04 NOTE — Progress Notes (Signed)
Portable AP Pelvis and Lateral Right Hip X-rays done. 

## 2016-06-04 NOTE — Progress Notes (Signed)
Dr. Al Corpus in and made aware of patient's vital signs-orders given -Labetalol begun

## 2016-06-04 NOTE — Anesthesia Preprocedure Evaluation (Signed)
Anesthesia Evaluation  Patient identified by MRN, date of birth, ID band Patient awake    Reviewed: Allergy & Precautions, NPO status , Patient's Chart, lab work & pertinent test results  Airway Mallampati: I  TM Distance: >3 FB Neck ROM: Full    Dental  (+) Teeth Intact, Upper Dentures, Edentulous Upper, Dental Advisory Given   Pulmonary sleep apnea (uses CPAP occasionally) , former smoker,    breath sounds clear to auscultation       Cardiovascular hypertension, Pt. on medications  Rhythm:Regular Rate:Normal     Neuro/Psych    GI/Hepatic   Endo/Other  Morbid obesity  Renal/GU      Musculoskeletal   Abdominal   Peds  Hematology   Anesthesia Other Findings   Reproductive/Obstetrics                             Anesthesia Physical Anesthesia Plan  ASA: III  Anesthesia Plan: General   Post-op Pain Management:    Induction: Intravenous  Airway Management Planned: LMA and Oral ETT  Additional Equipment:   Intra-op Plan:   Post-operative Plan: Extubation in OR  Informed Consent: I have reviewed the patients History and Physical, chart, labs and discussed the procedure including the risks, benefits and alternatives for the proposed anesthesia with the patient or authorized representative who has indicated his/her understanding and acceptance.   Dental advisory given  Plan Discussed with: CRNA, Anesthesiologist and Surgeon  Anesthesia Plan Comments:         Anesthesia Quick Evaluation

## 2016-06-04 NOTE — Progress Notes (Signed)
Dr. Al Corpus in to see patient- O.K. To go to floor

## 2016-06-04 NOTE — Progress Notes (Signed)
X-ray results noted 

## 2016-06-04 NOTE — Progress Notes (Signed)
Dr. Al Corpus in to see patient- made aware of heart rates and blood pressures- orders given.

## 2016-06-04 NOTE — H&P (Signed)
TOTAL HIP ADMISSION H&P  Patient is admitted for right total hip arthroplasty.  Subjective:  Chief Complaint: right hip pain  HPI: Mathew Lara, 55 y.o. male, has a history of pain and functional disability in the right hip(s) due to avascular necrosis and patient has failed non-surgical conservative treatments for greater than 12 weeks to include NSAID's and/or analgesics, use of assistive devices and activity modification.  Onset of symptoms was abrupt starting 3 years ago with rapidlly worsening course since that time.The patient noted no past surgery on the right hip(s).  Patient currently rates pain in the right hip at 10 out of 10 with activity. Patient has night pain, worsening of pain with activity and weight bearing, pain that interfers with activities of daily living and pain with passive range of motion. Patient has evidence of subchondral cysts and joint space narrowing by imaging studies. This condition presents safety issues increasing the risk of falls.  There is no current active infection.  Patient Active Problem List   Diagnosis Date Noted  . Avascular necrosis of bone of right hip ([place]) 06/04/2016  . Hepatitis C 03/21/2015  . History of alcohol abuse 11/08/2013  . Tobacco abuse 11/08/2013  . Essential hypertension, benign 11/07/2013  . Abnormal ECG 11/07/2013   Past Medical History:  Diagnosis Date  . Alcohol abuse   . Anxiety   . Bronchitis   . Chronic hip pain   . Hepatitis C   . Hypertension   . Mixed hyperlipidemia   . Renal disorder    states renal dysfunction, "strained kidney"  . Sleep apnea     Past Surgical History:  Procedure Laterality Date  . COLONOSCOPY N/A 01/23/2014   SLF:The LEFT COLON IS EXTREMELY redundant/TWO RECTAL POLYPS REMOVED/SMALL VOLUME RECTAL BLEEDING MOST LIKELY DUE TO Small internal hemorrhoids  . ESOPHAGOGASTRODUODENOSCOPY N/A 01/23/2014   IBB:CWUGQBVQ ring at the gastroesophageal junction/Medium sized hiatal hernia/NDYSPEPSIA MOST  LIKELY DUE TO GERD/MILD Non-erosive gastritis  . HEMORRHOID BANDING  01/23/2014   Procedure: HEMORRHOID BANDING;  Surgeon: Danie Binder, MD;  Location: [facility];  Service: Endoscopy;;  . MOUTH SURGERY      Prescriptions Prior to Admission  Medication Sig Dispense Refill Last Dose  . acetaminophen (TYLENOL) 650 MG CR tablet Take 1,300 mg by mouth every 8 (eight) hours as needed for pain.   Past Week at Unknown time  . amLODipine (NORVASC) 10 MG tablet Take 10 mg by mouth daily.   06/04/2016 at 0545  . aspirin EC 81 MG tablet Take 81 mg by mouth daily.   05/27/2016  . losartan (COZAAR) 50 MG tablet Take 50 mg by mouth daily.   06/03/2016 at Unknown time  . gabapentin (NEURONTIN) 100 MG capsule Take 1 capsule (100 mg total) by mouth 3 (three) times daily. (Patient not taking: Reported on 05/27/2016) 60 capsule 2 Not Taking at Unknown time  . HYDROcodone-acetaminophen (NORCO/VICODIN) 5-325 MG tablet Take 1 tablet by mouth every 8 (eight) hours as needed. (Patient not taking: Reported on 05/27/2016) 30 tablet 0 Not Taking at Unknown time  . nabumetone (RELAFEN) 500 MG tablet Take 1 tablet (500 mg total) by mouth 2 (two) times daily. (Patient not taking: Reported on 05/27/2016) 60 tablet 5 Not Taking at Unknown time  . naproxen sodium (ANAPROX) 220 MG tablet Take 440 mg by mouth daily.   More than a month at Unknown time  . orphenadrine (NORFLEX) 100 MG tablet Take 1 tablet (100 mg total) by mouth 2 (two) times daily. (  Patient not taking: Reported on 05/27/2016) 60 tablet 2 Not Taking at Unknown time   No Known Allergies  Social History  Substance Use Topics  . Smoking status: Former Smoker    Types: Cigarettes    Quit date: 05/28/2014  . Smokeless tobacco: Never Used     Comment: quit in November 2015 after smoking x 20 yrs.  . Alcohol use No    Family History  Problem Relation Age of Onset  . Cancer Mother      Review of Systems  Musculoskeletal: Positive for joint pain.  All other  systems reviewed and are negative.   Objective:  Physical Exam  Constitutional: He is oriented to person, place, and time. He appears well-developed and well-nourished.  HENT:  Head: Normocephalic and atraumatic.  Eyes: EOM are normal. Pupils are equal, round, and reactive to light.  Neck: Normal range of motion.  Cardiovascular: Normal rate and regular rhythm.   Respiratory: Effort normal and breath sounds normal.  GI: Soft. Bowel sounds are normal.  Musculoskeletal:       Right hip: He exhibits decreased range of motion, decreased strength, tenderness and bony tenderness.  Neurological: He is alert and oriented to person, place, and time.  Skin: Skin is warm and dry.  Psychiatric: He has a normal mood and affect.    Vital signs in last 24 hours: Temp:  [97.1 F (36.2 C)] 97.1 F (36.2 C) (09/29 0525) Pulse Rate:  [84] 84 (09/29 0525) Resp:  [20] 20 (09/29 0525) BP: (138)/(100) 138/100 (09/29 0525) SpO2:  [99 %] 99 % (09/29 0525) Weight:  [115.2 kg (254 lb)] 115.2 kg (254 lb) (09/29 0525)  Labs:   Estimated body mass index is 38.62 kg/m as calculated from the following:   Height as of this encounter: 5\' 8"  (1.727 m).   Weight as of this encounter: 115.2 kg (254 lb).   Imaging Review Plain radiographs and MRI demonstrate severe avascular necrosis of the right hip  Assessment/Plan:  AVN, right hip(s)  The patient history, physical examination, clinical judgement of the provider and imaging studies are consistent with AVN right hip(s) and total hip arthroplasty is deemed medically necessary. The treatment options including medical management, injection therapy, arthroscopy and arthroplasty were discussed at length. The risks and benefits of total hip arthroplasty were presented and reviewed. The risks due to aseptic loosening, infection, stiffness, dislocation/subluxation,  thromboembolic complications and other imponderables were discussed.  The patient acknowledged the  explanation, agreed to proceed with the plan and consent was signed. Patient is being admitted for inpatient treatment for surgery, pain control, PT, OT, prophylactic antibiotics, VTE prophylaxis, progressive ambulation and ADL's and discharge planning.The patient is planning to be discharged home with home health services

## 2016-06-04 NOTE — Transfer of Care (Signed)
Immediate Anesthesia Transfer of Care Note  Patient: Mathew Lara  Procedure(s) Performed: Procedure(s): RIGHT TOTAL HIP ARTHROPLASTY ANTERIOR APPROACH (Right)  Patient Location: PACU  Anesthesia Type:General  Level of Consciousness:  sedated, patient cooperative and responds to stimulation  Airway & Oxygen Therapy:Patient Spontanous Breathing and Patient connected to face mask oxgen  Post-op Assessment:  Report given to PACU RN and Post -op Vital signs reviewed and stable  Post vital signs:  Reviewed and stable  Last Vitals:  Vitals:   06/04/16 0525  BP: (!) 138/100  Pulse: 84  Resp: 20  Temp: 21.5 C    Complications: No apparent anesthesia complications

## 2016-06-04 NOTE — Brief Op Note (Signed)
06/04/2016  9:13 AM  PATIENT:  Mathew Lara  55 y.o. male  PRE-OPERATIVE DIAGNOSIS:  avascular necrosis right hip  POST-OPERATIVE DIAGNOSIS:  avascular necrosis right hip  PROCEDURE:  Procedure(s): RIGHT TOTAL HIP ARTHROPLASTY ANTERIOR APPROACH (Right)  SURGEON:  Surgeon(s) and Role:    * Mcarthur Rossetti, MD - Primary  PHYSICIAN ASSISTANT: Benita Stabile, PA-C  ANESTHESIA:   general  EBL:  Total I/O In: 1000 [I.V.:1000] Out: 600 [Urine:200; Blood:400]  COUNTS:  YES  DICTATION: .Other Dictation: Dictation Number [id number]  PLAN OF CARE: Admit to inpatient   PATIENT DISPOSITION:  PACU - hemodynamically stable.   Delay start of Pharmacological VTE agent (>24hrs) due to surgical blood loss or risk of bleeding: no

## 2016-06-05 LAB — BASIC METABOLIC PANEL
Anion gap: 8 (ref 5–15)
BUN: 19 mg/dL (ref 6–20)
CHLORIDE: 105 mmol/L (ref 101–111)
CO2: 24 mmol/L (ref 22–32)
Calcium: 9.4 mg/dL (ref 8.9–10.3)
Creatinine, Ser: 1.34 mg/dL — ABNORMAL HIGH (ref 0.61–1.24)
GFR calc Af Amer: >60 mL/min (ref >60)
GFR calc non Af Amer: 58 mL/min — ABNORMAL LOW (ref >60)
Glucose, Bld: 132 mg/dL — ABNORMAL HIGH (ref 65–99)
POTASSIUM: 4.4 mmol/L (ref 3.5–5.1)
SODIUM: 137 mmol/L (ref 135–145)

## 2016-06-05 LAB — CBC
HCT: 32.2 % — ABNORMAL LOW (ref 39.0–52.0)
HEMOGLOBIN: 10.6 g/dL — AB (ref 13.0–17.0)
MCH: 27.3 pg (ref 26.0–34.0)
MCHC: 32.9 g/dL (ref 30.0–36.0)
MCV: 83 fL (ref 78.0–100.0)
Platelets: 283 10*3/uL (ref 150–400)
RBC: 3.88 MIL/uL — AB (ref 4.22–5.81)
RDW: 15.1 % (ref 11.5–15.5)
WBC: 15.4 10*3/uL — ABNORMAL HIGH (ref 4.0–10.5)

## 2016-06-05 NOTE — Op Note (Signed)
NAMEANIL, HAVARD NO.:  [id number]  MEDICAL RECORD NO.:  [id number]  LOCATION:  5621                         FACILITY:  [facility]  PHYSICIAN:  Lind Guest. Ninfa Linden, M.D.DATE OF BIRTH:  [birth year]  DATE OF PROCEDURE:  06/04/2016 DATE OF DISCHARGE:                              OPERATIVE REPORT   PREOPERATIVE DIAGNOSIS:  Severe avascular necrosis, right hip.  POSTOPERATIVE DIAGNOSIS:  Severe avascular necrosis, right hip.  PROCEDURE:  Right total hip arthroplasty using direct anterior approach.  IMPLANTS:  DePuy Sector Gription acetabular component size 54, size 36+ 4 neutral polyethylene liner, size 13 Corail femoral component with varus offset (KLA), size 36+ 8.5 hip ball.  SURGEON:  Lind Guest. Ninfa Linden, M.D.  ASSISTANT:  Erskine Emery, PA-C.  ANESTHESIA:  General.  ANTIBIOTICS:  2 g of IV Ancef.  BLOOD LOSS:  400 to 500 mL.  COMPLICATIONS:  None.  INDICATIONS:  Mr. Glace is a 55 year old gentleman well-known to me. He has severe avascular necrosis involving both of his hips and the right hip is the worst as this is the worst pain form as well.  This femoral head started showing signs of collapse and an MRI confirms the diagnosis as well.  It is likely alcohol-related from alcohol abuse in the past.  He is otherwise a healthy individual.  He does have hepatitis C, but is nonreactive right now.  He has some kidney disease and he cannot take anti-inflammatories.  His pain is daily, it is severe, it has detrimentally affected his activities of daily living, his quality of life, and mobility.  We have recommended total hip arthroplasty.  He understands the risks of acute blood loss anemia, nerve and vessel injury, fracture, infection, DVT, and even acute kidney issues given likely blood loss from surgery.  The goals are decreased pain, improved mobility, and overall improved quality of life.  PROCEDURE DESCRIPTION:  After informed consent was  obtained, appropriate right hip was marked.  He was brought to the operating room, where general anesthesia was obtained while he was on a stretcher.  A Foley catheter was placed so we could monitor his urine output.  Traction boots were placed on both his feet.  Next, he was placed supine on the HANA fracture table with the perineal post in place and both legs in inline skeletal traction devices, but no traction applied.  His right operative hip was then prepped and draped with DuraPrep and sterile drapes.  Time-out was called and he was identified as correct patient, correct right hip.  We then made an incision inferior and posterior to the anterior superior iliac spine and carried this obliquely down the leg.  We dissected down the tensor fascia lata muscle.  Tensor fascia was divided longitudinally so we could proceed with a direct anterior approach to the hip.  I identified and cauterized the circumflex vessels and identified the hip capsule.  I opened up the hip capsule in an L- type format finding a very large joint effusion.  We placed Cobra retractors within the joint capsule and then made our femoral neck cut with an oscillating saw proximal to the lesser trochanter and completed this with an osteotome.  We  placed a corkscrew guide in the femoral head and removed the femoral head in its entirety, found areas of friable cartilage associated with avascular necrosis.  We then cleaned debris from the hip socket including remnants of acetabular labrum.  I placed a bent Hohmann over the medial acetabular rim and then began reaming under direct visualization from a size 43 reamer up to a size 54.  With all reamers under direct visualization, the last reamer also under direct fluoroscopy, we could assess our depth of reaming, our inclination, and anteversion.  Once I was pleased with this, I placed the real DePuy Sector Gription acetabular component size 54, and a 36+ 4  polyethylene liner.  Attention was then turned to the femur.  With the leg externally rotated to 120 degrees, extended and adducted, we were able to place a bent Hohmann behind the greater trochanter and a Mueller retractor medially.  We used a box cutting osteotome to enter the femoral canal and a rongeur to lateralize.  We released the lateral joint capsule as well.  We then began broaching from a size 8 broach using Corail broaching system up to a size 13.  With the size 13 in place, went to a 36+ 1.5 hip ball and brought the leg back over and up with traction and rotation reducing the pelvis and after we felt that it was a stable, we went with the real Corail then size 13 with varus offset femoral neck and the real 36+ 1.5 hip ball, but once we reduced in the pelvis, we realized that it was unstable.  We felt like we needed to actually go with more leg length to slightly more offset.  We were able to remove that ball and trialed a 36+ 8.5 hip ball and we were pleased with leg length and offset with that as well as stability.  We then changed out the real 36+ 1.5 hip ball for a real 36+ 8.5.  We were then pleased with the range of motion, stability, offset, and leg length.  We then irrigated the soft tissue with normal saline solution using pulsatile lavage.  We were able to close the joint capsule with interrupted #1 Ethibond suture followed by running #1 Vicryl in the tensor fascia, 0- Vicryl in the deep tissue, 2-0 Vicryl in the subcutaneous tissue, and interrupted staples on the skin.  Xeroform and Aquacel dressing were applied.  He was taken off the HANA table, awakened, extubated, and taken to the recovery room in stable condition.  All final counts were correct.  There were no complications noted.  Of note, Erskine Emery, PA- C assisted in the entire case.  His assistance was crucial for facilitating all aspects of this case.     Lind Guest. Ninfa Linden,  M.D.     CYB/MEDQ  D:  06/04/2016  T:  06/05/2016  Job:  [id number]

## 2016-06-05 NOTE — Progress Notes (Signed)
Patient continues to decline nocturnal CPAP/BiPAP. Equipment removed from room. Order changed to prn per RT protocol.

## 2016-06-05 NOTE — Evaluation (Signed)
Occupational Therapy Evaluation and Discharge Patient Details Name: Mathew Lara MRN: [id number] DOB: 29-Nov-1960 Today's Date: 06/05/2016    History of Present Illness Pt s/p R THR 2* avn   Clinical Impression   Pt was independent in self care and ambulating with a cane prior to admission. Presents with R hip pain, generalized weakness and impaired balance interfering with ability to perform ADL at his baseline. Pt and family educated in home safety/fall prevention, use of AE and compensatory strategies for LB bathing and dressing and multiple uses of 3 in 1. Pt and family verbalizing understanding of all information. No further OT needs.    Follow Up Recommendations  No OT follow up    Equipment Recommendations  None recommended by OT    Recommendations for Other Services       Precautions / Restrictions Precautions Precautions: Fall Restrictions Weight Bearing Restrictions: No Other Position/Activity Restrictions: WBAT      Mobility Bed Mobility      General bed mobility comments: pt in chair, returned to chair  Transfers Overall transfer level: Needs assistance Equipment used: Rolling walker (2 wheeled) Transfers: Sit to/from Stand Sit to Stand: Min guard         General transfer comment: from chair, increased time and heavy reliance on UEs    Balance                                            ADL Overall ADL's : Needs assistance/impaired Eating/Feeding: Independent;Sitting   Grooming: Wash/dry hands;Sitting;Set up   Upper Body Bathing: Set up;Sitting   Lower Body Bathing: Sit to/from stand;Moderate assistance Lower Body Bathing Details (indicate cue type and reason): educated in use of long handled sponge Upper Body Dressing : Set up;Sitting   Lower Body Dressing: Moderate assistance;Sit to/from stand Lower Body Dressing Details (indicate cue type and reason): practiced use of AE Toilet Transfer: Min  guard;Ambulation;BSC;RW Toilet Transfer Details (indicate cue type and reason): instructed in use of 3 in 1 over toilet       Tub/Shower Transfer Details (indicate cue type and reason): instructed in use of 3 in 1 as shower seat and technique, recommended pt practice with HHPT prior to attempting on his own   General ADL Comments: Pt stating wife and daughter will help him with any necessary ADL. Instructed pt in safe footwear and in transporting items safely with RW.     Vision     Perception     Praxis      Pertinent Vitals/Pain Pain Assessment: Faces Pain Score: 5  Faces Pain Scale: Hurts even more Pain Location: R hip Pain Descriptors / Indicators: Grimacing;Guarding Pain Intervention(s): Monitored during session;Premedicated before session;Repositioned;Ice applied     Hand Dominance Right   Extremity/Trunk Assessment Upper Extremity Assessment Upper Extremity Assessment: Overall WFL for tasks assessed   Lower Extremity Assessment Lower Extremity Assessment: Defer to PT evaluation   Cervical / Trunk Assessment Cervical / Trunk Assessment: Normal   Communication Communication Communication: No difficulties   Cognition Arousal/Alertness: Awake/alert Behavior During Therapy: WFL for tasks assessed/performed Overall Cognitive Status: Within Functional Limits for tasks assessed                     General Comments       Exercises      Shoulder Instructions      Home Living Family/patient  expects to be discharged to:: Private residence Living Arrangements: Spouse/significant other;Children Available Help at Discharge: Family;Available 24 hours/day Type of Home: House Home Access: Stairs to enter [employer] of Steps: 7+7 Entrance Stairs-Rails: Right Home Layout: Able to live on main level with bedroom/bathroom     Bathroom Shower/Tub: Tub/shower unit Shower/tub characteristics: Curtain Biochemist, clinical: Standard     Home Equipment:  Environmental consultant - 2 wheels;Cane - single point;Bedside commode          Prior Functioning/Environment Level of Independence: Independent;Independent with assistive device(s)        Comments: Used cane prn        OT Problem List: Decreased strength;Impaired balance (sitting and/or standing);Decreased knowledge of use of DME or AE;Obesity;Pain   OT Treatment/Interventions:      OT Goals(Current goals can be found in the care plan section) Acute Rehab OT Goals Patient Stated Goal: HOme by Sunday  OT Frequency:     Barriers to D/C:            Co-evaluation              End of Session Equipment Utilized During Treatment: Rolling walker  Activity Tolerance: Patient tolerated treatment well Patient left: in chair;with family/visitor present;with call bell/phone within reach   Time: 1149-1206 OT Time Calculation (min): 17 min Charges:  OT General Charges $OT Visit: 1 Procedure OT Evaluation $OT Eval Low Complexity: 1 Procedure G-Codes:    Malka So 06/05/2016, 1:05 PM  [phone]

## 2016-06-05 NOTE — Progress Notes (Signed)
Physical Therapy Treatment Patient Details Name: Eluterio Seymour MRN: [id number] DOB: 1961/04/20 Today's Date: 06/05/2016    History of Present Illness Pt s/p R THR 2* avn    PT Comments    Good progress with mobility.  Pt concerned regarding stairs at home.  Follow Up Recommendations  Home health PT     Equipment Recommendations  None recommended by PT    Recommendations for Other Services OT consult     Precautions / Restrictions Precautions Precautions: Fall Restrictions Weight Bearing Restrictions: No Other Position/Activity Restrictions: WBAT    Mobility  Bed Mobility Overal bed mobility: Needs Assistance Bed Mobility: Supine to Sit     Supine to sit: Min assist     General bed mobility comments: cues for sequence and use of L LE to self assist  Transfers Overall transfer level: Needs assistance Equipment used: Rolling walker (2 wheeled) Transfers: Sit to/from Stand Sit to Stand: Min assist;From elevated surface         General transfer comment: cues for LE management and use of UEs to self assist  Ambulation/Gait Ambulation/Gait assistance: Min assist;Min guard Ambulation Distance (Feet): 147 Feet Assistive device: Rolling walker (2 wheeled) Gait Pattern/deviations: Step-to pattern;Step-through pattern;Decreased step length - right Gait velocity: decr Gait velocity interpretation: Below normal speed for age/gender General Gait Details: cues for posture, position from RW and initial sequence   Stairs            Wheelchair Mobility    Modified Rankin (Stroke Patients Only)       Balance                                    Cognition Arousal/Alertness: Awake/alert Behavior During Therapy: WFL for tasks assessed/performed Overall Cognitive Status: Within Functional Limits for tasks assessed                      Exercises Total Joint Exercises Ankle Circles/Pumps: AROM;Both;15 reps;Supine Quad Sets:  AROM;Both;10 reps;Supine Heel Slides: AAROM;Right;Supine;20 reps Hip ABduction/ADduction: AAROM;Right;15 reps;Supine    General Comments        Pertinent Vitals/Pain Pain Assessment: 0-10 Pain Score: 5  Pain Location: R hip Pain Descriptors / Indicators: Aching;Sore Pain Intervention(s): Limited activity within patient's tolerance;Monitored during session;Premedicated before session;Ice applied    Home Living                      Prior Function            PT Goals (current goals can now be found in the care plan section) Acute Rehab PT Goals Patient Stated Goal: HOme by Sunday PT Goal Formulation: With patient Time For Goal Achievement: 06/09/16 Potential to Achieve Goals: Good Progress towards PT goals: Progressing toward goals    Frequency    7X/week      PT Plan Current plan remains appropriate    Co-evaluation             End of Session Equipment Utilized During Treatment: Gait belt Activity Tolerance: Patient tolerated treatment well Patient left: in chair;with call bell/phone within reach;with chair alarm set     Time: 5035-4656 PT Time Calculation (min) (ACUTE ONLY): 32 min  Charges:  $Gait Training: 8-22 mins $Therapeutic Exercise: 8-22 mins                    G Codes:  Zalen Sequeira 06/05/2016, 12:54 PM

## 2016-06-05 NOTE — Progress Notes (Signed)
Subjective: 1 Day Post-Op Procedure(s) (LRB): RIGHT TOTAL HIP ARTHROPLASTY ANTERIOR APPROACH (Right) Patient reports pain as moderate.    Objective: Vital signs in last 24 hours: Temp:  [97.8 F (36.6 C)-98.5 F (36.9 C)] 98.2 F (36.8 C) (09/30 0955) Pulse Rate:  [90-108] 97 (09/30 0955) Resp:  [14-18] 18 (09/30 0955) BP: (128-157)/(61-101) 128/82 (09/30 0955) SpO2:  [93 %-98 %] 97 % (09/30 0955)  Intake/Output from previous day: 09/29 0701 - 09/30 0700 In: 4866.3 [P.O.:1080; I.V.:3681.3; IV Piggyback:105] Out: 3775 [Urine:3375; Blood:400] Intake/Output this shift: Total I/O In: 120 [P.O.:120] Out: -    Recent Labs  06/05/16 0431  HGB 10.6*    Recent Labs  06/05/16 0431  WBC 15.4*  RBC 3.88*  HCT 32.2*  PLT 283    Recent Labs  06/05/16 0431  NA 137  K 4.4  CL 105  CO2 24  BUN 19  CREATININE 1.34*  GLUCOSE 132*  CALCIUM 9.4   No results for input(s): LABPT, INR in the last 72 hours.  Sensation intact distally Intact pulses distally Dorsiflexion/Plantar flexion intact Incision: scant drainage  Assessment/Plan: 1 Day Post-Op Procedure(s) (LRB): RIGHT TOTAL HIP ARTHROPLASTY ANTERIOR APPROACH (Right) Up with therapy Plan for discharge tomorrow Discharge home with home health  Mcarthur Rossetti 06/05/2016, 1:01 PM

## 2016-06-05 NOTE — Progress Notes (Signed)
Pt placed on CPAP in PACU for OSA, sedation.  Placed on 12 CM H2O.  Tolerating well.

## 2016-06-05 NOTE — Progress Notes (Signed)
Physical Therapy Treatment Patient Details Name: Keyvon Herter MRN: [id number] DOB: 06/27/1961 Today's Date: 06/05/2016    History of Present Illness Pt s/p R THR 2* avn    PT Comments    Pt progressing well with mobility, will review stairs in am.  Follow Up Recommendations  Home health PT     Equipment Recommendations  None recommended by PT    Recommendations for Other Services OT consult     Precautions / Restrictions Precautions Precautions: Fall Restrictions Weight Bearing Restrictions: No Other Position/Activity Restrictions: WBAT    Mobility  Bed Mobility Overal bed mobility: Needs Assistance Bed Mobility: Sit to Supine     Supine to sit: Min assist Sit to supine: Min assist   General bed mobility comments: cues for sequence and use of L LE to self assist  Transfers Overall transfer level: Needs assistance Equipment used: Rolling walker (2 wheeled) Transfers: Sit to/from Stand Sit to Stand: Min guard         General transfer comment: from chair, increased time and heavy reliance on UEs  Ambulation/Gait Ambulation/Gait assistance: Min guard Ambulation Distance (Feet): 275 Feet (and 15' into bathroom) Assistive device: Rolling walker (2 wheeled) Gait Pattern/deviations: Step-to pattern;Step-through pattern;Decreased step length - right;Shuffle;Trunk flexed Gait velocity: decr Gait velocity interpretation: Below normal speed for age/gender General Gait Details: cues for posture, position from RW and initial sequence   Stairs            Wheelchair Mobility    Modified Rankin (Stroke Patients Only)       Balance                                    Cognition Arousal/Alertness: Awake/alert Behavior During Therapy: WFL for tasks assessed/performed Overall Cognitive Status: Within Functional Limits for tasks assessed                      Exercises Total Joint Exercises Ankle Circles/Pumps: AROM;Both;15  reps;Supine Quad Sets: AROM;Both;10 reps;Supine Heel Slides: AAROM;Right;Supine;20 reps Hip ABduction/ADduction: AAROM;Right;15 reps;Supine    General Comments        Pertinent Vitals/Pain Pain Assessment: 0-10 Pain Score: 5  Faces Pain Scale: Hurts even more Pain Location: R hip Pain Descriptors / Indicators: Aching;Sore Pain Intervention(s): Limited activity within patient's tolerance;Monitored during session;Premedicated before session;Ice applied    Home Living Family/patient expects to be discharged to:: Private residence Living Arrangements: Spouse/significant other;Children Available Help at Discharge: Family;Available 24 hours/day Type of Home: House Home Access: Stairs to enter Entrance Stairs-Rails: Right Home Layout: Able to live on main level with bedroom/bathroom Home Equipment: Walker - 2 wheels;Cane - single point;Bedside commode      Prior Function Level of Independence: Independent;Independent with assistive device(s)      Comments: Used cane prn   PT Goals (current goals can now be found in the care plan section) Acute Rehab PT Goals Patient Stated Goal: HOme by Sunday PT Goal Formulation: With patient Time For Goal Achievement: 06/09/16 Potential to Achieve Goals: Good Progress towards PT goals: Progressing toward goals    Frequency    7X/week      PT Plan Current plan remains appropriate    Co-evaluation             End of Session Equipment Utilized During Treatment: Gait belt Activity Tolerance: Patient tolerated treatment well Patient left: in bed;with call bell/phone within reach  Time: 1351-1430 PT Time Calculation (min) (ACUTE ONLY): 39 min  Charges:  $Gait Training: 8-22 mins $Therapeutic Exercise: 8-22 mins $Therapeutic Activity: 8-22 mins                    G Codes:      Kaian Fahs 29-Jun-2016, 2:29 PM

## 2016-06-06 MED ORDER — ASPIRIN 81 MG PO CHEW: 81.0000 mg | CHEWABLE_TABLET | Freq: Two times a day (BID) | ORAL | 0 refills | Status: DC

## 2016-06-06 MED ORDER — OXYCODONE-ACETAMINOPHEN 5-325 MG PO TABS: 1.0000 | ORAL_TABLET | ORAL | 0 refills | Status: DC | PRN

## 2016-06-06 MED ORDER — METHOCARBAMOL 500 MG PO TABS: 500.0000 mg | ORAL_TABLET | Freq: Four times a day (QID) | ORAL | 0 refills | Status: DC | PRN

## 2016-06-06 NOTE — Discharge Summary (Signed)
Patient ID: Mathew Lara MRN: [id number] DOB/AGE: 1961/03/15 55 y.o.  Admit date: 06/04/2016 Discharge date: 06/06/2016  Admission Diagnoses:  Principal Problem:   Avascular necrosis of bone of right hip [facility]) Active Problems:   Status post total replacement of right hip   Discharge Diagnoses:  Same  Past Medical History:  Diagnosis Date  . Alcohol abuse   . Anxiety   . Bronchitis   . Chronic hip pain   . Hepatitis C   . Hypertension   . Mixed hyperlipidemia   . Renal disorder    states renal dysfunction, "strained kidney"  . Sleep apnea     Surgeries: Procedure(s): RIGHT TOTAL HIP ARTHROPLASTY ANTERIOR APPROACH on 06/04/2016   Consultants:   Discharged Condition: Improved  Hospital Course: Mathew Lara is an 55 y.o. male who was admitted 06/04/2016 for operative treatment ofAvascular necrosis of bone of right hip ([place]). Patient has severe unremitting pain that affects sleep, daily activities, and work/hobbies. After pre-op clearance the patient was taken to the operating room on 06/04/2016 and underwent  Procedure(s): RIGHT TOTAL HIP ARTHROPLASTY ANTERIOR APPROACH.    Patient was given perioperative antibiotics: Anti-infectives    Start     Dose/Rate Route Frequency Ordered Stop   06/04/16 1400  ceFAZolin (ANCEF) IVPB 1 g/50 mL premix     1 g 100 mL/hr over 30 Minutes Intravenous Every 6 hours 06/04/16 1239 06/04/16 2029   06/04/16 0509  ceFAZolin (ANCEF) IVPB 2g/100 mL premix     2 g 200 mL/hr over 30 Minutes Intravenous On call to O.R. 06/04/16 0509 06/04/16 0740       Patient was given sequential compression devices, early ambulation, and chemoprophylaxis to prevent DVT.  Patient benefited maximally from hospital stay and there were no complications.    Recent vital signs: Patient Vitals for the past 24 hrs:  BP Temp Temp src Pulse Resp SpO2  06/06/16 0631 - - - - - 96 %  06/06/16 0547 126/78 98.4 F (36.9 C) Oral (!) 107 19 (!) 89 %  06/05/16 2132 (!)  141/79 98.9 F (37.2 C) Oral 92 18 98 %  06/05/16 1357 119/83 98.1 F (36.7 C) Oral 92 18 97 %     Recent laboratory studies:  Recent Labs  06/05/16 0431  WBC 15.4*  HGB 10.6*  HCT 32.2*  PLT 283  NA 137  K 4.4  CL 105  CO2 24  BUN 19  CREATININE 1.34*  GLUCOSE 132*  CALCIUM 9.4     Discharge Medications:     Medication List    STOP taking these medications   aspirin EC 81 MG tablet Replaced by:  aspirin 81 MG chewable tablet   HYDROcodone-acetaminophen 5-325 MG tablet Commonly known as:  NORCO/VICODIN     TAKE these medications   acetaminophen 650 MG CR tablet Commonly known as:  TYLENOL Take 1,300 mg by mouth every 8 (eight) hours as needed for pain.   amLODipine 10 MG tablet Commonly known as:  NORVASC Take 10 mg by mouth daily.   aspirin 81 MG chewable tablet Chew 1 tablet (81 mg total) by mouth 2 (two) times daily. Replaces:  aspirin EC 81 MG tablet   gabapentin 100 MG capsule Commonly known as:  NEURONTIN Take 1 capsule (100 mg total) by mouth 3 (three) times daily.   losartan 50 MG tablet Commonly known as:  COZAAR Take 50 mg by mouth daily.   methocarbamol 500 MG tablet Commonly known as:  ROBAXIN Take 1 tablet (  500 mg total) by mouth every 6 (six) hours as needed for muscle spasms.   nabumetone 500 MG tablet Commonly known as:  RELAFEN Take 1 tablet (500 mg total) by mouth 2 (two) times daily.   naproxen sodium 220 MG tablet Commonly known as:  ANAPROX Take 440 mg by mouth daily.   orphenadrine 100 MG tablet Commonly known as:  NORFLEX Take 1 tablet (100 mg total) by mouth 2 (two) times daily.   oxyCODONE-acetaminophen 5-325 MG tablet Commonly known as:  ROXICET Take 1-2 tablets by mouth every 4 (four) hours as needed.       Diagnostic Studies: Dg C-arm 1-60 Min-no Report  Result Date: 06/04/2016 CLINICAL DATA: hip C-ARM 1-60 MINUTES Fluoroscopy was utilized by the requesting physician.  No radiographic interpretation.   Dg  Hip Port Unilat With Pelvis 1v Right  Result Date: 06/04/2016 CLINICAL DATA:  Post right hip replacement. EXAM: DG HIP (WITH OR WITHOUT PELVIS) 1V PORT RIGHT COMPARISON:  03/23/2016 FINDINGS: Right hip arthroplasty. Right femoral stem is completely visualized. No evidence for a periprosthetic fracture. Surgical skin staples and expected soft tissue gas. Pelvic bony ring is intact. No gross abnormality to the left hip. Right hip arthroplasty is located. IMPRESSION: Right hip arthroplasty without complicating features. Electronically Signed   By: Markus Daft M.D.   On: 06/04/2016 10:00   Dg Hip Operative Unilat With Pelvis Right  Result Date: 06/04/2016 CLINICAL DATA:  Right hip replacement. EXAM: OPERATIVE RIGHT HIP (WITH PELVIS IF PERFORMED) 2 VIEWS TECHNIQUE: Fluoroscopic spot image(s) were submitted for interpretation post-operatively. COMPARISON:  10/16/2015. FINDINGS: Total right hip replacement. Hardware intact. Anatomic alignment. No acute or focal abnormality. IMPRESSION: Total right hip replacement. Hardware intact. Anatomic alignment. No acute abnormality. Electronically Signed   By: Marcello Moores  Register   On: 06/04/2016 09:23    Disposition: 01-Home or Self Care  Discharge Instructions    Call MD / Call 911    Complete by:  As directed    If you experience chest pain or shortness of breath, CALL 911 and be transported to the hospital emergency room.  If you develope a fever above 101 F, pus (white drainage) or increased drainage or redness at the wound, or calf pain, call your surgeon's office.   Constipation Prevention    Complete by:  As directed    Drink plenty of fluids.  Prune juice may be helpful.  You may use a stool softener, such as Colace (over the counter) 100 mg twice a day.  Use MiraLax (over the counter) for constipation as needed.   Diet - low sodium heart healthy    Complete by:  As directed    Discharge patient    Complete by:  As directed    Increase activity slowly as  tolerated    Complete by:  As directed       Follow-up [facility] .   Why:  [facility] will call you to arrange appointment Physical Therapy Contact information: [place] [address] [phone]        Mcarthur Rossetti, MD Follow up in 2 week(s).   Specialty:  Orthopedic Surgery Contact information: [place] [address] [phone]            Signed: Mcarthur Rossetti 06/06/2016, 11:34 AM

## 2016-06-06 NOTE — Progress Notes (Signed)
Physical Therapy Treatment Patient Details Name: Mathew Lara MRN: [id number] DOB: Apr 27, 1961 Today's Date: 10-Jun-2016    History of Present Illness Pt s/p R THR 2* avn    PT Comments    POD # 2 pm session Assisted with amb in hallway a second time. Practiced stairs using one rail and one crutch.  Pt has 4 steps to enter home then split level inside.   Pt ready for D/C to home.   Follow Up Recommendations  Home health PT     Equipment Recommendations  None recommended by PT    Recommendations for Other Services       Precautions / Restrictions Precautions Precautions: Fall Restrictions Weight Bearing Restrictions: No Other Position/Activity Restrictions: WBAT    Mobility  Bed Mobility OOB in recliner  Transfers Overall transfer level: Needs assistance Equipment used: Rolling walker (2 wheeled) Transfers: Sit to/from [employer] Transfers Sit to Stand: Supervision;Min guard Stand pivot transfers: Supervision;Min guard       General transfer comment: 25% VC's on proper tech and hand placement esp with stand to sit.   Ambulation/Gait Ambulation/Gait assistance: Min guard Ambulation Distance (Feet): 22 Feet   Gait Pattern/deviations: Step-to pattern;Decreased stance time - right Gait velocity: decr   General Gait Details: decreased amb distance due to increased c/o pain and stiffness.  Greater disfficulty advancing R LE die to pain level.     Stairs Stairs: Yes Stairs assistance: Min guard Stair Management: One rail Left;Step to pattern;Forwards;With crutches Number of Stairs: 4 General stair comments: one rail and one crutch with 50% VC's on proper sequencing and safety with crutch placement.    Wheelchair Mobility    Modified Rankin (Stroke Patients Only)       Balance                                    Cognition Arousal/Alertness: Awake/alert Behavior During Therapy: WFL for tasks assessed/performed Overall Cognitive  Status: Within Functional Limits for tasks assessed                      Exercises      General Comments        Pertinent Vitals/Pain Pain Assessment: 0-10 Pain Score: 7  Pain Location: R hip Pain Descriptors / Indicators: Aching;Grimacing;Sore Pain Intervention(s): Monitored during session;Repositioned;Ice applied    Home Living                      Prior Function            PT Goals (current goals can now be found in the care plan section) Progress towards PT goals: Progressing toward goals    Frequency    7X/week      PT Plan Current plan remains appropriate    Co-evaluation             End of Session Equipment Utilized During Treatment: Gait belt Activity Tolerance: Patient limited by pain Patient left: in chair;with call bell/phone within reach     Time: 1136-1208 PT Time Calculation (min) (ACUTE ONLY): 32 min  Charges:  $Gait Training: 8-22 mins $Therapeutic Activity: 8-22 mins                    G Codes:      Nathanial Rancher 06-10-2016, 12:34 PM

## 2016-06-06 NOTE — Care Management Note (Addendum)
Case Management Note  Patient Details  Name: Jorah Hua MRN: [id number] Date of Birth: [birth year]  Subjective/Objective:    Right THA                Action/Plan: Discharge Planning: NCM spoke to pt and states wife, Loretta Plume is at home to assist with his care. Has RW and 3n1 at home. Offered choice for [facility]. Pt preoperatively arranged with [facility]. Pt agreeable to [facility].     Expected Discharge Date:  06/06/2016              Expected Discharge Plan:  [place]  In-House Referral:  NA  Discharge planning Services  CM Consult  Post Acute Care Choice:  Home Health Choice offered to:  Patient  DME Arranged:  N/A DME Agency:  NA  HH Arranged:  NA HH Agency:  [facility] (now [facility])  Status of Service:  Completed, signed off  If discussed at [employer] of Stay Meetings, dates discussed:    Additional Comments:  Erenest Rasher, RN 06/06/2016, 11:30 AM

## 2016-06-06 NOTE — Progress Notes (Signed)
Patient ID: Mathew Lara, male   DOB: [birth year], 55 y.o.   MRN: [id number] Doing well.  Can go home today.

## 2016-06-06 NOTE — Progress Notes (Signed)
Assessment unchanged. Pt and wife verbalized understanding of dc instructions through teach back including follow up care and when to call the doctor. Scripts x 3 given as provided by MD. Discharged via wc to front entrance accompanied by wife, family and NT to meet awaiting vehicle to carry home.

## 2016-06-06 NOTE — Progress Notes (Signed)
Physical Therapy Treatment Patient Details Name: Mathew Lara MRN: [id number] DOB: May 24, 1961 Today's Date: 06/06/2016    History of Present Illness Pt s/p R THR 2* avn    PT Comments    POD # 2 am session Pt demonstrating increased pain today > yesterday.  Required increased assist and amb decreased distance.   Pt will need another PT session prior to D/C to home.    Follow Up Recommendations  Home health PT     Equipment Recommendations  None recommended by PT    Recommendations for Other Services       Precautions / Restrictions Precautions Precautions: Fall Restrictions Weight Bearing Restrictions: No Other Position/Activity Restrictions: WBAT    Mobility  Bed Mobility Overal bed mobility: Needs Assistance Bed Mobility: Supine to Sit     Supine to sit: Mod assist     General bed mobility comments: required increased assist due to pain level and "stiffness"   Transfers Overall transfer level: Needs assistance Equipment used: Rolling walker (2 wheeled) Transfers: Sit to/from [employer] Transfers Sit to Stand: Supervision;Min guard Stand pivot transfers: Supervision;Min guard       General transfer comment: 25% VC's on proper tech and hand placement esp with stand to sit.   Ambulation/Gait Ambulation/Gait assistance: Min guard Ambulation Distance (Feet): 28 Feet   Gait Pattern/deviations: Step-to pattern;Decreased stance time - right Gait velocity: decr   General Gait Details: decreased amb distance due to increased c/o pain and stiffness.  Greater disfficulty advancing R LE die to pain level.     Stairs            Wheelchair Mobility    Modified Rankin (Stroke Patients Only)       Balance                                    Cognition Arousal/Alertness: Awake/alert Behavior During Therapy: WFL for tasks assessed/performed Overall Cognitive Status: Within Functional Limits for tasks assessed                       Exercises      General Comments        Pertinent Vitals/Pain Pain Assessment: 0-10 Pain Score: 7  Pain Location: R hip Pain Descriptors / Indicators: Aching;Grimacing;Sore Pain Intervention(s): Monitored during session;Repositioned;Ice applied    Home Living                      Prior Function            PT Goals (current goals can now be found in the care plan section) Progress towards PT goals: Progressing toward goals    Frequency    7X/week      PT Plan Current plan remains appropriate    Co-evaluation             End of Session Equipment Utilized During Treatment: Gait belt Activity Tolerance: Patient limited by pain Patient left: in chair;with call bell/phone within reach     Time: 7412-8786 PT Time Calculation (min) (ACUTE ONLY): 25 min  Charges:  $Gait Training: 8-22 mins $Therapeutic Activity: 8-22 mins                    G Codes:      Rica Koyanagi  PTA [facility]  Acute  Rehab Pager      [phone]

## 2016-06-06 NOTE — Discharge Instructions (Signed)

## 2016-06-14 ENCOUNTER — Inpatient Hospital Stay (INDEPENDENT_AMBULATORY_CARE_PROVIDER_SITE_OTHER): Payer: Self-pay | Admitting: Orthopaedic Surgery

## 2016-06-21 ENCOUNTER — Inpatient Hospital Stay (INDEPENDENT_AMBULATORY_CARE_PROVIDER_SITE_OTHER): Payer: BLUE CROSS/BLUE SHIELD | Admitting: Orthopaedic Surgery

## 2016-06-21 DIAGNOSIS — M87051 Idiopathic aseptic necrosis of right femur: Secondary | ICD-10-CM

## 2016-06-21 DIAGNOSIS — M87052 Idiopathic aseptic necrosis of left femur: Secondary | ICD-10-CM

## 2016-06-28 ENCOUNTER — Telehealth: Payer: Self-pay | Admitting: Orthopedic Surgery

## 2016-06-28 NOTE — Telephone Encounter (Signed)
Patient came by office inquiring about a records request to [facility]; per [employer] representative [place], 10 pages of records were done, payment posted, and loaded into [facility] online portal, on 06/23/16.  Reference records ID# [id number].  Patient aware.

## 2016-07-01 ENCOUNTER — Encounter (INDEPENDENT_AMBULATORY_CARE_PROVIDER_SITE_OTHER): Payer: Self-pay | Admitting: *Deleted

## 2016-07-01 ENCOUNTER — Other Ambulatory Visit (INDEPENDENT_AMBULATORY_CARE_PROVIDER_SITE_OTHER): Payer: Self-pay | Admitting: *Deleted

## 2016-07-01 DIAGNOSIS — B182 Chronic viral hepatitis C: Secondary | ICD-10-CM

## 2016-07-20 ENCOUNTER — Ambulatory Visit (INDEPENDENT_AMBULATORY_CARE_PROVIDER_SITE_OTHER): Payer: BLUE CROSS/BLUE SHIELD | Admitting: Orthopaedic Surgery

## 2016-07-20 DIAGNOSIS — Z96641 Presence of right artificial hip joint: Secondary | ICD-10-CM

## 2016-07-20 MED ORDER — OXYCODONE-ACETAMINOPHEN 5-325 MG PO TABS: 1.0000 | ORAL_TABLET | ORAL | 0 refills | Status: DC | PRN

## 2016-07-20 MED ORDER — METHOCARBAMOL 500 MG PO TABS: 500.0000 mg | ORAL_TABLET | Freq: Four times a day (QID) | ORAL | 0 refills | Status: DC | PRN

## 2016-07-20 NOTE — Progress Notes (Signed)
Mr. Majerus is now between 6 and 7 weeks postoperative from a right total hip arthroplasty to treat AVN. He is ready go back to work and says his pain is minimal. On examination the hip he has fluid range of motion of his right hip with no difficulties at all. His leg lengths are equal. He is walking without a limp. His incisions well-healed. He has minimal pain over trochanteric area.  At this point he feels ready go back to work. He says he can be full workdays without restrictions. I did give him a refill of oxycodone and methocarbamol. I will see him back in 6 months. At that visit I would like a low AP pelvis and lateral of his right operative hip.

## 2016-07-21 ENCOUNTER — Telehealth (INDEPENDENT_AMBULATORY_CARE_PROVIDER_SITE_OTHER): Payer: Self-pay

## 2016-07-21 MED ORDER — HYDROCODONE-ACETAMINOPHEN 5-325 MG PO TABS: 1.0000 | ORAL_TABLET | Freq: Four times a day (QID) | ORAL | 0 refills | Status: DC | PRN

## 2016-07-21 NOTE — Telephone Encounter (Signed)
Per patient requests to mail Rx to him

## 2016-07-21 NOTE — Telephone Encounter (Signed)
Can come and pick up norco script

## 2016-07-21 NOTE — Telephone Encounter (Signed)
Patient would like for Dr to prescribe a different Rx for pain states he is having a lot of trouble getting the oxy Dr Ninfa Linden prescribed  due to ins. Has not filled it yet.

## 2016-07-21 NOTE — Addendum Note (Signed)
Addended by: Jean Rosenthal on: 07/21/2016 11:39 AM   Modules accepted: Orders

## 2016-07-21 NOTE — Telephone Encounter (Signed)
Please advise 

## 2016-07-22 ENCOUNTER — Telehealth (INDEPENDENT_AMBULATORY_CARE_PROVIDER_SITE_OTHER): Payer: Self-pay | Admitting: *Deleted

## 2016-07-22 NOTE — Telephone Encounter (Signed)
[facility] called stating amount written needs a PA. CB: [phone]

## 2016-07-22 NOTE — Telephone Encounter (Signed)
I mailed new Rx to him per his request

## 2016-12-21 ENCOUNTER — Ambulatory Visit (HOSPITAL_COMMUNITY)
Admission: RE | Admit: 2016-12-21 | Discharge: 2016-12-21 | Disposition: A | Discharge status: Home / Self Care | Payer: BLUE CROSS/BLUE SHIELD | Source: Ambulatory Visit | Attending: Family Medicine | Admitting: Family Medicine

## 2016-12-21 ENCOUNTER — Other Ambulatory Visit (HOSPITAL_COMMUNITY): Payer: Self-pay | Admitting: Family Medicine

## 2016-12-21 DIAGNOSIS — R05 Cough: Secondary | ICD-10-CM

## 2016-12-21 DIAGNOSIS — R918 Other nonspecific abnormal finding of lung field: Secondary | ICD-10-CM | POA: Insufficient documentation

## 2016-12-21 DIAGNOSIS — R059 Cough, unspecified: Secondary | ICD-10-CM

## 2017-01-05 ENCOUNTER — Encounter (HOSPITAL_COMMUNITY): Payer: Self-pay | Admitting: Emergency Medicine

## 2017-01-05 ENCOUNTER — Emergency Department (HOSPITAL_COMMUNITY)
Admission: EM | Admit: 2017-01-05 | Discharge: 2017-01-05 | Disposition: A | Discharge status: Home / Self Care | Payer: BLUE CROSS/BLUE SHIELD | Attending: Emergency Medicine | Admitting: Emergency Medicine

## 2017-01-05 DIAGNOSIS — Z7982 Long term (current) use of aspirin: Secondary | ICD-10-CM | POA: Insufficient documentation

## 2017-01-05 DIAGNOSIS — L509 Urticaria, unspecified: Secondary | ICD-10-CM | POA: Diagnosis not present

## 2017-01-05 DIAGNOSIS — I10 Essential (primary) hypertension: Secondary | ICD-10-CM | POA: Diagnosis not present

## 2017-01-05 DIAGNOSIS — Z79899 Other long term (current) drug therapy: Secondary | ICD-10-CM | POA: Diagnosis not present

## 2017-01-05 DIAGNOSIS — Z87891 Personal history of nicotine dependence: Secondary | ICD-10-CM | POA: Diagnosis not present

## 2017-01-05 DIAGNOSIS — R21 Rash and other nonspecific skin eruption: Secondary | ICD-10-CM | POA: Diagnosis present

## 2017-01-05 MED ORDER — FAMOTIDINE 20 MG PO TABS: 20.0000 mg | ORAL_TABLET | Freq: Two times a day (BID) | ORAL | 0 refills | Status: DC

## 2017-01-05 MED ORDER — DEXAMETHASONE SODIUM PHOSPHATE 4 MG/ML IJ SOLN
8.0000 mg | Freq: Once | INTRAMUSCULAR | Status: AC
  Administered 2017-01-05: 8 mg via INTRAMUSCULAR
  Filled 2017-01-05: qty 2

## 2017-01-05 MED ORDER — DIPHENHYDRAMINE HCL 25 MG PO CAPS
50.0000 mg | ORAL_CAPSULE | Freq: Once | ORAL | Status: AC
  Administered 2017-01-05: 50 mg via ORAL
  Filled 2017-01-05: qty 2

## 2017-01-05 MED ORDER — FAMOTIDINE 20 MG PO TABS
20.0000 mg | ORAL_TABLET | Freq: Once | ORAL | Status: AC
  Administered 2017-01-05: 20 mg via ORAL
  Filled 2017-01-05: qty 1

## 2017-01-05 MED ORDER — DIPHENHYDRAMINE HCL 25 MG PO TABS: 25.0000 mg | ORAL_TABLET | Freq: Four times a day (QID) | ORAL | 0 refills | Status: DC

## 2017-01-05 NOTE — ED Notes (Signed)
Rash to trunk and arms with itching.  Denies difficulty breathing or swallowing. Notice itching yesterday.  Pt ate spaghetti and garlic bread last night.  Denies any recent tick bites.

## 2017-01-05 NOTE — Discharge Instructions (Signed)
Please make a list of your meals and snacks over the next three or four days. Please schedule an appointment with the allergist for testing. Use pepcid 2 times daily. Use benadryl with each meal and at bed time for the next 2 days, then on an as needed basis.Return to the Emergency Dept if any difficulty with breathing or other emergent problem.

## 2017-01-05 NOTE — ED Provider Notes (Signed)
[facility] DEPT Provider Note   CSN: [id number] Arrival date & time: 01/05/17  0909     History   Chief Complaint Chief Complaint  Patient presents with  . Rash    HPI Mathew Lara is a 56 y.o. male.  Patient is a 56 year old male who presents to the emergency department with a complaint of hives.  The patient states this problem started on yesterday May 1. He ate spaghetti garlic bread on last night and began to have some itching later during the night. It is of note that the patient is been recently treated for bronchitis. He was on over-the-counter cold medications as well as an antibiotic. He has finished the antibiotic. His been no other new changes in the environment, medications, over-the-counter vitamins, or foods. The patient denies any unusual shortness of breath, facial swelling, difficulty speaking, or difficulty swallowing.      Past Medical History:  Diagnosis Date  . Alcohol abuse   . Anxiety   . Bronchitis   . Chronic hip pain   . Hepatitis C   . Hypertension   . Mixed hyperlipidemia   . Renal disorder    states renal dysfunction, "strained kidney"  . Sleep apnea     Patient Active Problem List   Diagnosis Date Noted  . Avascular necrosis of bone of right hip ([place]) 06/04/2016  . Status post total replacement of right hip 06/04/2016  . Hepatitis C 03/21/2015  . History of alcohol abuse 11/08/2013  . Tobacco abuse 11/08/2013  . Essential hypertension, benign 11/07/2013  . Abnormal ECG 11/07/2013    Past Surgical History:  Procedure Laterality Date  . COLONOSCOPY N/A 01/23/2014   SLF:The LEFT COLON IS EXTREMELY redundant/TWO RECTAL POLYPS REMOVED/SMALL VOLUME RECTAL BLEEDING MOST LIKELY DUE TO Small internal hemorrhoids  . ESOPHAGOGASTRODUODENOSCOPY N/A 01/23/2014   JIR:CVELFYBO ring at the gastroesophageal junction/Medium sized hiatal hernia/NDYSPEPSIA MOST LIKELY DUE TO GERD/MILD Non-erosive gastritis  . HEMORRHOID BANDING  01/23/2014   Procedure: HEMORRHOID BANDING;  Surgeon: Danie Binder, MD;  Location[facility];  Service: Endoscopy;;  . MOUTH SURGERY    . TOTAL HIP ARTHROPLASTY Right 06/04/2016   Procedure: RIGHT TOTAL HIP ARTHROPLASTY ANTERIOR APPROACH;  Surgeon: Mcarthur Rossetti, MD;  Location: [facility] ORS;  Service: Orthopedics;  Laterality: Right;       Home Medications    Prior to Admission medications   Medication Sig Start Date End Date Taking? Authorizing Provider  acetaminophen (TYLENOL) 650 MG CR tablet Take 1,300 mg by mouth every 8 (eight) hours as needed for pain.   Yes Historical Provider, MD  albuterol (PROVENTIL HFA;VENTOLIN HFA) 108 (90 Base) MCG/ACT inhaler Inhale 1-2 puffs into the lungs every 6 (six) hours as needed for wheezing or shortness of breath.   Yes Historical Provider, MD  amLODipine (NORVASC) 10 MG tablet Take 10 mg by mouth daily.   Yes Historical Provider, MD  aspirin 81 MG chewable tablet Chew 1 tablet (81 mg total) by mouth 2 (two) times daily. 06/06/16  Yes Mcarthur Rossetti, MD  losartan (COZAAR) 50 MG tablet Take 50 mg by mouth daily.   Yes Historical Provider, MD  gabapentin (NEURONTIN) 100 MG capsule Take 1 capsule (100 mg total) by mouth 3 (three) times daily. Patient not taking: Reported on 05/27/2016 11/19/15   Carole Civil, MD  HYDROcodone-acetaminophen (NORCO/VICODIN) 5-325 MG tablet Take 1 tablet by mouth every 6 (six) hours as needed for moderate pain. Patient not taking: Reported on 01/05/2017 07/21/16   Harrell Gave  Kerry Fort, MD  methocarbamol (ROBAXIN) 500 MG tablet Take 1 tablet (500 mg total) by mouth every 6 (six) hours as needed for muscle spasms. Patient not taking: Reported on 01/05/2017 07/20/16   Mcarthur Rossetti, MD  nabumetone (RELAFEN) 500 MG tablet Take 1 tablet (500 mg total) by mouth 2 (two) times daily. Patient not taking: Reported on 05/27/2016 12/23/15   Carole Civil, MD  naproxen sodium (ANAPROX) 220 MG tablet Take 440 mg by mouth  daily.    Historical Provider, MD  orphenadrine (NORFLEX) 100 MG tablet Take 1 tablet (100 mg total) by mouth 2 (two) times daily. Patient not taking: Reported on 05/27/2016 11/19/15   Carole Civil, MD    Family History Family History  Problem Relation Age of Onset  . Cancer Mother     Social History Social History  Substance Use Topics  . Smoking status: Former Smoker    Types: Cigarettes    Quit date: 05/28/2014  . Smokeless tobacco: Never Used     Comment: quit in November 2015 after smoking x 20 yrs.  . Alcohol use No     Allergies   Patient has no known allergies.   Review of Systems Review of Systems  HENT: Negative for facial swelling.   Respiratory: Negative for cough, choking, shortness of breath and wheezing.   Skin: Positive for rash.  All other systems reviewed and are negative.    Physical Exam Updated Vital Signs BP (!) 141/101 (BP Location: Right Arm)   Pulse 92   Temp 98.1 F (36.7 C) (Oral)   Resp 18   SpO2 96%   Physical Exam  Constitutional: He is oriented to person, place, and time. He appears well-developed and well-nourished.  Non-toxic appearance.  HENT:  Head: Normocephalic.  Right Ear: Tympanic membrane and external ear normal.  Left Ear: Tympanic membrane and external ear normal.  No facial swelling. Airway is patent. No swelling of lips or tongue.  Eyes: EOM and lids are normal. Pupils are equal, round, and reactive to light.  Neck: Normal range of motion. Neck supple. Carotid bruit is not present.  Cardiovascular: Normal rate, regular rhythm, normal heart sounds, intact distal pulses and normal pulses.   Pulmonary/Chest: Breath sounds normal. No respiratory distress.  Pt speaks in complete sentences without problem.  Abdominal: Soft. Bowel sounds are normal. There is no tenderness. There is no guarding.  Musculoskeletal: Normal range of motion.  Lymphadenopathy:       Head (right side): No submandibular adenopathy present.        Head (left side): No submandibular adenopathy present.    He has no cervical adenopathy.  Neurological: He is alert and oriented to person, place, and time. He has normal strength. No cranial nerve deficit or sensory deficit.  Skin: Skin is warm and dry. Rash noted. Rash is urticarial. No cyanosis.  Psychiatric: He has a normal mood and affect. His speech is normal.  Nursing note and vitals reviewed.    ED Treatments / Results  Labs (all labs ordered are listed, but only abnormal results are displayed) Labs Reviewed - No data to display  EKG  EKG Interpretation None       Radiology No results found.  Procedures Procedures (including critical care time)  Medications Ordered in ED Medications  dexamethasone (DECADRON) injection 8 mg (8 mg Intramuscular Given 01/05/17 1129)  famotidine (PEPCID) tablet 20 mg (20 mg Oral Given 01/05/17 1129)  diphenhydrAMINE (BENADRYL) capsule 50 mg (50 mg Oral  Given 01/05/17 1129)     Initial Impression / Assessment and Plan / ED Course  I have reviewed the triage vital signs and the nursing notes.  Pertinent labs & imaging results that were available during my care of the patient were reviewed by me and considered in my medical decision making (see chart for details).       Final Clinical Impressions(s) / ED Diagnoses MDM Pulse oximetry is 100% on room air. Blood pressure is elevated, but otherwise the vital signs within normal limits. Patient treated with antihistamine, 1 and 2, and intramuscular Decadron.  Pt feeling much better after medications. Recheck. Airway patent. Hives improving. Lungs clear. Patient speaks in complete sentences without problem. Rx for vistaril and pepcid given to the patient. Patient referred to allergy physicians.    Final diagnoses:  Hives    New Prescriptions New Prescriptions   No medications on file     Lily Kocher, PA-C 01/06/17 Mckinley Jewel, MD 01/08/17 [phone]

## 2017-01-05 NOTE — ED Triage Notes (Signed)
Pt c/o rash over trunck started today. Hives noticed to trunk and arms.new  rx meds last week for bronchitis. nad. No resp distress

## 2017-01-18 ENCOUNTER — Ambulatory Visit (INDEPENDENT_AMBULATORY_CARE_PROVIDER_SITE_OTHER): Payer: Self-pay | Admitting: Orthopaedic Surgery

## 2017-02-21 ENCOUNTER — Ambulatory Visit (INDEPENDENT_AMBULATORY_CARE_PROVIDER_SITE_OTHER): Payer: Self-pay | Admitting: Orthopaedic Surgery

## 2017-07-21 ENCOUNTER — Telehealth: Payer: Self-pay | Admitting: Orthopedic Surgery

## 2017-07-21 ENCOUNTER — Ambulatory Visit (INDEPENDENT_AMBULATORY_CARE_PROVIDER_SITE_OTHER): Payer: Self-pay | Admitting: Orthopaedic Surgery

## 2017-07-21 NOTE — Telephone Encounter (Signed)
Patient called wanting to see Dr. Aline Brochure for his hip pain. He stated he had hip surgery last year by Dr. Ninfa Linden. I told him that since he was referred to Dr. Ninfa Linden and had surgery that he should try to schedule an appointment to see Dr. Ninfa Linden. He was agreeable to this and  asked for Dr. Trevor Mace number.

## 2017-07-27 ENCOUNTER — Other Ambulatory Visit (INDEPENDENT_AMBULATORY_CARE_PROVIDER_SITE_OTHER): Payer: Self-pay

## 2017-07-27 ENCOUNTER — Telehealth (INDEPENDENT_AMBULATORY_CARE_PROVIDER_SITE_OTHER): Payer: Self-pay | Admitting: Orthopaedic Surgery

## 2017-07-27 ENCOUNTER — Encounter (INDEPENDENT_AMBULATORY_CARE_PROVIDER_SITE_OTHER): Payer: Self-pay | Admitting: Orthopaedic Surgery

## 2017-07-27 ENCOUNTER — Ambulatory Visit (INDEPENDENT_AMBULATORY_CARE_PROVIDER_SITE_OTHER): Payer: BLUE CROSS/BLUE SHIELD

## 2017-07-27 ENCOUNTER — Ambulatory Visit (INDEPENDENT_AMBULATORY_CARE_PROVIDER_SITE_OTHER): Payer: BLUE CROSS/BLUE SHIELD | Admitting: Orthopaedic Surgery

## 2017-07-27 DIAGNOSIS — M25552 Pain in left hip: Secondary | ICD-10-CM | POA: Diagnosis not present

## 2017-07-27 DIAGNOSIS — M25551 Pain in right hip: Secondary | ICD-10-CM | POA: Diagnosis not present

## 2017-07-27 DIAGNOSIS — Z96641 Presence of right artificial hip joint: Secondary | ICD-10-CM | POA: Diagnosis not present

## 2017-07-27 DIAGNOSIS — M87052 Idiopathic aseptic necrosis of left femur: Secondary | ICD-10-CM | POA: Diagnosis not present

## 2017-07-27 MED ORDER — TRAMADOL HCL 50 MG PO TABS: 50.0000 mg | ORAL_TABLET | Freq: Four times a day (QID) | ORAL | 0 refills | Status: DC | PRN

## 2017-07-27 NOTE — Telephone Encounter (Signed)
Pt returned your call, please call back when you get a chance. Thanks! [phone]

## 2017-07-27 NOTE — Progress Notes (Signed)
Office Visit Note   Patient: Mathew Lara           Date of Birth: [birth year]           MRN: [id number] Visit Date: 07/27/2017              Requested by: [name], [address], [place] [address] PCP: [name], MD   Assessment & Plan: Visit Diagnoses:  1. Bilateral hip pain   2. Status post total replacement of right hip   3. Avascular necrosis of bone of left hip (HCC)     Plan: I do feel that he would benefit from a total hip arthroplasty on his left hip at this standpoint.  He would rather try at least one intra-articular steroid injection since he is getting back to work and he understands fully how this can affect his hip.  We will set him up for a one-time only steroid injection in the left hip joint under direct fluoroscopy.  Want him to take Aleve twice a day as well as an occasional tramadol.  He will use his cane in his opposite right hand.  I will see him back in 6 weeks to see how he is doing overall and hopefully he will of had the intra-articular injection by them of his left hip.  Follow-Up Instructions: Return in about 6 weeks (around 09/07/2017).   Orders:  Orders Placed This Encounter  Procedures  . XR HIPS BILAT W OR W/O PELVIS 2V   Meds ordered this encounter  Medications  . traMADol (ULTRAM) 50 MG tablet    Sig: Take 1-2 tablets (50-100 mg total) by mouth every 6 (six) hours as needed.    Dispense:  60 tablet    Refill:  0     Procedures: No procedures performed   Clinical Data: No additional findings.   Subjective: Chief Complaint  Patient presents with  . Left Hip - Pain  The patient is now 14 months out status post a right total hip arthroplasty.  He said the right hip is done well.  This was to treat known severe avascular necrosis of his right hip.  At that time an MRI showed avascular necrosis of the left hip.  He was not any significant pain of his left hip at the time but now he is developing worse pain in the  left hip.  It hurts with weightbearing and with mobility and rotating the hip.  He has been using a cane in his opposite hand.  He is worked on taking anti-inflammatories and activity modification as well as weight loss.  He says his right hip is doing well.   HPI  Review of Systems He currently denies any headache, chest pain shortness of breath, fever, chills, nausea, vomiting.  Objective: Vital Signs: There were no vitals taken for this visit.  Physical Exam He is alert and oriented x3 and in no acute distress.  He is walking with a limp and using a cane. Ortho Exam Examination of both hips shows his right operative hip moves smoothly with good internal S rotation as well as flexion extension and no pain.  His left hip has severe pain with any attempts of internal and external rotation. Specialty Comments:  No specialty comments available.  Imaging: Xr Hips Bilat W Or W/o Pelvis 2v  Result Date: 07/27/2017 An AP pelvis and lateral of his right operative hip shows a well-seated implant with no complicating features.  His left hip shows  flattening of the femoral head and narrowing of his joint space.  We looked at the MRI from 2017 of both his hips and it did show significant avascular necrosis of both hips with the right worse than left.  The left had pretty significant disease at the time as well.  PMFS History: Patient Active Problem List   Diagnosis Date Noted  . Avascular necrosis of bone of left hip ([place]) 07/27/2017  . Avascular necrosis of bone of right hip ([place]) 06/04/2016  . Status post total replacement of right hip 06/04/2016  . Hepatitis C 03/21/2015  . History of alcohol abuse 11/08/2013  . Tobacco abuse 11/08/2013  . Essential hypertension, benign 11/07/2013  . Abnormal ECG 11/07/2013   Past Medical History:  Diagnosis Date  . Alcohol abuse   . Anxiety   . Bronchitis   . Chronic hip pain   . Hepatitis C   . Hypertension   . Mixed hyperlipidemia   . Renal  disorder    states renal dysfunction, "strained kidney"  . Sleep apnea     Family History  Problem Relation Age of Onset  . Cancer Mother     Past Surgical History:  Procedure Laterality Date  . COLONOSCOPY N/A 01/23/2014   SLF:The LEFT COLON IS EXTREMELY redundant/TWO RECTAL POLYPS REMOVED/SMALL VOLUME RECTAL BLEEDING MOST LIKELY DUE TO Small internal hemorrhoids  . ESOPHAGOGASTRODUODENOSCOPY N/A 01/23/2014   GYK:[id number] ring at the gastroesophageal junction/Medium sized hiatal hernia/NDYSPEPSIA MOST LIKELY DUE TO GERD/MILD Non-erosive gastritis  . HEMORRHOID BANDING  01/23/2014   Procedure: HEMORRHOID BANDING;  Surgeon: Danie Binder, MD;  Location[facility];  Service: Endoscopy;;  . MOUTH SURGERY    . TOTAL HIP ARTHROPLASTY Right 06/04/2016   Procedure: RIGHT TOTAL HIP ARTHROPLASTY ANTERIOR APPROACH;  Surgeon: Mcarthur Rossetti, MD;  Location: [facility] ORS;  Service: Orthopedics;  Laterality: Right;   Social History   Occupational History    Comment: [employer]  Tobacco Use  . Smoking status: Former Smoker    Types: Cigarettes    Last attempt to quit: 05/28/2014    Years since quitting: 3.1  . Smokeless tobacco: Never Used  . Tobacco comment: quit in November 2015 after smoking x 20 yrs.  Substance and Sexual Activity  . Alcohol use: No    Alcohol/week: 0.0 oz  . Drug use: No  . Sexual activity: Not on file

## 2017-08-10 ENCOUNTER — Ambulatory Visit (INDEPENDENT_AMBULATORY_CARE_PROVIDER_SITE_OTHER): Payer: BLUE CROSS/BLUE SHIELD

## 2017-08-10 ENCOUNTER — Ambulatory Visit (INDEPENDENT_AMBULATORY_CARE_PROVIDER_SITE_OTHER): Payer: BLUE CROSS/BLUE SHIELD | Admitting: Physical Medicine and Rehabilitation

## 2017-08-10 DIAGNOSIS — M25552 Pain in left hip: Secondary | ICD-10-CM | POA: Diagnosis not present

## 2017-08-10 MED ORDER — TRIAMCINOLONE ACETONIDE 40 MG/ML IJ SUSP
80.0000 mg | INTRAMUSCULAR | Status: AC | PRN
  Administered 2017-08-10: 80 mg via INTRA_ARTICULAR

## 2017-08-10 MED ORDER — BUPIVACAINE HCL 0.5 % IJ SOLN
3.0000 mL | INTRAMUSCULAR | Status: AC | PRN
  Administered 2017-08-10: 3 mL via INTRA_ARTICULAR

## 2017-08-10 NOTE — Patient Instructions (Signed)

## 2017-08-10 NOTE — Progress Notes (Signed)
Mathew Lara - 56 y.o. male MRN [id number]  Date of birth: April 12, 1961  Office Visit Note: Visit Date: 08/10/2017 PCP: [name], MD Referred by: [name], MD  Subjective: Chief Complaint  Patient presents with  . Left Hip - Pain   HPI: Mathew Lara is a 56 year old gentleman prior right total hip arthroplasty by Dr. Ninfa Linden.  He is not having left hip pain static hip arthrogram on the left.    ROS Otherwise per HPI.  Assessment & Plan: Visit Diagnoses:  1. Pain in left hip     Plan: Findings:  Diagnostic and hopefully therapeutic hip arthrogram on the left.  Patient did get relief during the anesthetic phase of the injection.    Meds & Orders: No orders of the defined types were placed in this encounter.   Orders Placed This Encounter  Procedures  . Large Joint Inj: L hip joint  . XR C-ARM NO REPORT    Follow-up: Return if symptoms worsen or fail to improve, for Dr. Ninfa Linden.   Procedures: Large Joint Inj: L hip joint on 08/10/2017 9:20 AM Indications: pain and diagnostic evaluation Details: 22 G needle, anterior approach  Arthrogram: Yes  Medications: 80 mg triamcinolone acetonide 40 MG/ML; 3 mL bupivacaine 0.5 % Outcome: tolerated well, no immediate complications  Arthrogram demonstrated excellent flow of contrast throughout the joint surface without extravasation or obvious defect.  The patient had relief of symptoms during the anesthetic phase of the injection.  Procedure, treatment alternatives, risks and benefits explained, specific risks discussed. Consent was given by the patient. Immediately prior to procedure a time out was called to verify the correct patient, procedure, equipment, support staff and site/side marked as required. Patient was prepped and draped in the usual sterile fashion.      No notes on file   Clinical History: No specialty comments available.  He reports that he quit smoking about 3 years ago. His smoking use included  cigarettes. he has never used smokeless tobacco. No results for input(s): HGBA1C, LABURIC in the last 8760 hours.  Objective:  VS:  HT:    WT:   BMI:     BP:   HR: bpm  TEMP: ( )  RESP:  Physical Exam  Ortho Exam Imaging: No results found.  Past Medical/Family/Surgical/Social History: Medications & Allergies reviewed per EMR Patient Active Problem List   Diagnosis Date Noted  . Avascular necrosis of bone of left hip ([place]) 07/27/2017  . Avascular necrosis of bone of right hip ([place]) 06/04/2016  . Status post total replacement of right hip 06/04/2016  . Hepatitis C 03/21/2015  . History of alcohol abuse 11/08/2013  . Tobacco abuse 11/08/2013  . Essential hypertension, benign 11/07/2013  . Abnormal ECG 11/07/2013   Past Medical History:  Diagnosis Date  . Alcohol abuse   . Anxiety   . Bronchitis   . Chronic hip pain   . Hepatitis C   . Hypertension   . Mixed hyperlipidemia   . Renal disorder    states renal dysfunction, "strained kidney"  . Sleep apnea    Family History  Problem Relation Age of Onset  . Cancer Mother    Past Surgical History:  Procedure Laterality Date  . COLONOSCOPY N/A 01/23/2014   SLF:The LEFT COLON IS EXTREMELY redundant/TWO RECTAL POLYPS REMOVED/SMALL VOLUME RECTAL BLEEDING MOST LIKELY DUE TO Small internal hemorrhoids  . ESOPHAGOGASTRODUODENOSCOPY N/A 01/23/2014   JQB:HALPFXTK ring at the gastroesophageal junction/Medium sized hiatal hernia/NDYSPEPSIA MOST LIKELY DUE TO GERD/MILD Non-erosive  gastritis  . HEMORRHOID BANDING  01/23/2014   Procedure: HEMORRHOID BANDING;  Surgeon: Danie Binder, MD;  [facility];  Service: Endoscopy;;  . MOUTH SURGERY    . TOTAL HIP ARTHROPLASTY Right 06/04/2016   Procedure: RIGHT TOTAL HIP ARTHROPLASTY ANTERIOR APPROACH;  Surgeon: Mcarthur Rossetti, MD;  Location: [facility];  Service: Orthopedics;  Laterality: Right;   Social History   Occupational History    Comment: [employer]  Tobacco Use  .  Smoking status: Former Smoker    Types: Cigarettes    Last attempt to quit: 05/28/2014    Years since quitting: 3.2  . Smokeless tobacco: Never Used  . Tobacco comment: quit in November 2015 after smoking x 20 yrs.  Substance and Sexual Activity  . Alcohol use: No    Alcohol/week: 0.0 oz  . Drug use: No  . Sexual activity: Not on file

## 2017-08-10 NOTE — Progress Notes (Deleted)
Patient here today , sent by Dr. Ninfa Linden for a left hip injection.

## 2017-09-07 ENCOUNTER — Ambulatory Visit (INDEPENDENT_AMBULATORY_CARE_PROVIDER_SITE_OTHER): Payer: Self-pay | Admitting: Orthopaedic Surgery

## 2017-11-22 ENCOUNTER — Other Ambulatory Visit (HOSPITAL_COMMUNITY): Payer: Self-pay | Admitting: Family Medicine

## 2017-11-22 ENCOUNTER — Ambulatory Visit (HOSPITAL_COMMUNITY)
Admission: RE | Admit: 2017-11-22 | Discharge: 2017-11-22 | Disposition: A | Discharge status: Home / Self Care | Payer: BLUE CROSS/BLUE SHIELD | Source: Ambulatory Visit | Attending: Family Medicine | Admitting: Family Medicine

## 2017-11-22 DIAGNOSIS — G4733 Obstructive sleep apnea (adult) (pediatric): Secondary | ICD-10-CM | POA: Diagnosis not present

## 2017-11-22 DIAGNOSIS — I10 Essential (primary) hypertension: Secondary | ICD-10-CM | POA: Diagnosis not present

## 2017-11-22 DIAGNOSIS — N183 Chronic kidney disease, stage 3 (moderate): Secondary | ICD-10-CM | POA: Diagnosis not present

## 2017-11-22 DIAGNOSIS — R9389 Abnormal findings on diagnostic imaging of other specified body structures: Secondary | ICD-10-CM | POA: Insufficient documentation

## 2017-11-22 DIAGNOSIS — J069 Acute upper respiratory infection, unspecified: Secondary | ICD-10-CM | POA: Diagnosis not present

## 2017-11-22 DIAGNOSIS — Z1389 Encounter for screening for other disorder: Secondary | ICD-10-CM | POA: Diagnosis not present

## 2017-11-22 DIAGNOSIS — J449 Chronic obstructive pulmonary disease, unspecified: Secondary | ICD-10-CM | POA: Diagnosis not present

## 2017-11-22 DIAGNOSIS — E782 Mixed hyperlipidemia: Secondary | ICD-10-CM | POA: Diagnosis not present

## 2017-11-22 DIAGNOSIS — R05 Cough: Secondary | ICD-10-CM | POA: Diagnosis not present

## 2017-11-22 DIAGNOSIS — R7309 Other abnormal glucose: Secondary | ICD-10-CM | POA: Diagnosis not present

## 2017-11-22 DIAGNOSIS — Z6841 Body Mass Index (BMI) 40.0 and over, adult: Secondary | ICD-10-CM | POA: Diagnosis not present

## 2017-12-01 DIAGNOSIS — Z6841 Body Mass Index (BMI) 40.0 and over, adult: Secondary | ICD-10-CM | POA: Diagnosis not present

## 2017-12-01 DIAGNOSIS — R7309 Other abnormal glucose: Secondary | ICD-10-CM | POA: Diagnosis not present

## 2017-12-01 DIAGNOSIS — Z1389 Encounter for screening for other disorder: Secondary | ICD-10-CM | POA: Diagnosis not present

## 2017-12-01 DIAGNOSIS — N183 Chronic kidney disease, stage 3 (moderate): Secondary | ICD-10-CM | POA: Diagnosis not present

## 2017-12-01 DIAGNOSIS — G4730 Sleep apnea, unspecified: Secondary | ICD-10-CM | POA: Diagnosis not present

## 2017-12-19 ENCOUNTER — Telehealth (INDEPENDENT_AMBULATORY_CARE_PROVIDER_SITE_OTHER): Payer: Self-pay | Admitting: Orthopaedic Surgery

## 2017-12-19 ENCOUNTER — Other Ambulatory Visit (INDEPENDENT_AMBULATORY_CARE_PROVIDER_SITE_OTHER): Payer: Self-pay

## 2017-12-19 DIAGNOSIS — M25552 Pain in left hip: Secondary | ICD-10-CM

## 2017-12-19 NOTE — Telephone Encounter (Signed)
Sent order to [facility] pool

## 2017-12-19 NOTE — Telephone Encounter (Signed)
Patient called asking if he could possibly get another injection in his left hip. Cb # [phone]

## 2017-12-19 NOTE — Telephone Encounter (Signed)
Ok to have one more by [facility] in May

## 2017-12-19 NOTE — Telephone Encounter (Signed)
Please advise 

## 2018-01-03 DIAGNOSIS — N183 Chronic kidney disease, stage 3 (moderate): Secondary | ICD-10-CM | POA: Diagnosis not present

## 2018-01-03 DIAGNOSIS — D649 Anemia, unspecified: Secondary | ICD-10-CM | POA: Diagnosis not present

## 2018-01-03 DIAGNOSIS — I10 Essential (primary) hypertension: Secondary | ICD-10-CM | POA: Diagnosis not present

## 2018-01-03 DIAGNOSIS — R809 Proteinuria, unspecified: Secondary | ICD-10-CM | POA: Diagnosis not present

## 2018-01-06 ENCOUNTER — Ambulatory Visit (INDEPENDENT_AMBULATORY_CARE_PROVIDER_SITE_OTHER): Payer: BLUE CROSS/BLUE SHIELD | Admitting: Physical Medicine and Rehabilitation

## 2018-01-06 ENCOUNTER — Ambulatory Visit (INDEPENDENT_AMBULATORY_CARE_PROVIDER_SITE_OTHER): Payer: BLUE CROSS/BLUE SHIELD

## 2018-01-06 ENCOUNTER — Encounter (INDEPENDENT_AMBULATORY_CARE_PROVIDER_SITE_OTHER): Payer: Self-pay | Admitting: Physical Medicine and Rehabilitation

## 2018-01-06 DIAGNOSIS — M25552 Pain in left hip: Secondary | ICD-10-CM

## 2018-01-06 MED ORDER — TRIAMCINOLONE ACETONIDE 40 MG/ML IJ SUSP
80.0000 mg | INTRAMUSCULAR | Status: AC | PRN
Start: 2018-01-06 — End: 2018-01-06
  Administered 2018-01-06: 80 mg via INTRA_ARTICULAR

## 2018-01-06 MED ORDER — BUPIVACAINE HCL 0.5 % IJ SOLN
3.0000 mL | INTRAMUSCULAR | Status: AC | PRN
  Administered 2018-01-06: 3 mL via INTRA_ARTICULAR

## 2018-01-06 NOTE — Progress Notes (Signed)
 .  Numeric Pain Rating Scale and Functional Assessment Average Pain 8   In the last MONTH (on 0-10 scale) has pain interfered with the following?  1. General activity like being  able to carry out your everyday physical activities such as walking, climbing stairs, carrying groceries, or moving a chair?  Rating(7)   -Dye Allergies.  

## 2018-01-06 NOTE — Patient Instructions (Signed)

## 2018-01-06 NOTE — Progress Notes (Signed)
Mathew Lara - 57 y.o. male MRN [id number]  Date of birth: [birth year]  Office Visit Note: Visit Date: 01/06/2018 PCP: [name], MD Referred by: [name], MD  Subjective: Chief Complaint  Patient presents with  . Left Hip - Pain   HPI: Mathew Lara is a 57 year old gentleman with prior right total hip arthroplasty and left hip AVN.  He had prior left hip injection and diagnostic and therapeutic anesthetic arthrogram back in December with good relief.  We are going to repeat the injection today on the left.  He said renewed symptoms without trauma.  He will continue to follow-up with Dr. Ninfa Linden and looks like he will be headed for replacement on this side as well.   ROS Otherwise per HPI.  Assessment & Plan: Visit Diagnoses:  1. Pain in left hip     Plan: No additional findings.   Meds & Orders: No orders of the defined types were placed in this encounter.   Orders Placed This Encounter  Procedures  . Large Joint Inj: L hip joint  . XR C-ARM NO REPORT    Follow-up: Return if symptoms worsen or fail to improve.   Procedures: Large Joint Inj: L hip joint on 01/06/2018 9:22 AM Indications: diagnostic evaluation and pain Details: 22 G 3.5 in needle, fluoroscopy-guided anterior approach  Arthrogram: No  Medications: 80 mg triamcinolone acetonide 40 MG/ML; 3 mL bupivacaine 0.5 % Outcome: tolerated well, no immediate complications  There was excellent flow of contrast producing a partial arthrogram of the hip. The patient did have relief of symptoms during the anesthetic phase of the injection. Procedure, treatment alternatives, risks and benefits explained, specific risks discussed. Consent was given by the patient. Immediately prior to procedure a time out was called to verify the correct patient, procedure, equipment, support staff and site/side marked as required. Patient was prepped and draped in the usual sterile fashion.      No notes on file   Clinical  History: No specialty comments available.   He reports that he quit smoking about 3 years ago. His smoking use included cigarettes. He has never used smokeless tobacco. No results for input(s): HGBA1C, LABURIC in the last 8760 hours.  Objective:  VS:  HT:    WT:   BMI:     BP:   HR: bpm  TEMP: ( )  RESP:  Physical Exam  Ortho Exam Imaging: Xr C-arm No Report  Result Date: 01/06/2018 Please see Notes or Procedures tab for imaging impression.   Past Medical/Family/Surgical/Social History: Medications & Allergies reviewed per EMR, new medications updated. Patient Active Problem List   Diagnosis Date Noted  . Avascular necrosis of bone of left hip ([place]) 07/27/2017  . Avascular necrosis of bone of right hip ([place]) 06/04/2016  . Status post total replacement of right hip 06/04/2016  . Hepatitis C 03/21/2015  . History of alcohol abuse 11/08/2013  . Tobacco abuse 11/08/2013  . Essential hypertension, benign 11/07/2013  . Abnormal ECG 11/07/2013   Past Medical History:  Diagnosis Date  . Alcohol abuse   . Anxiety   . Bronchitis   . Chronic hip pain   . Hepatitis C   . Hypertension   . Mixed hyperlipidemia   . Renal disorder    states renal dysfunction, "strained kidney"  . Sleep apnea    Family History  Problem Relation Age of Onset  . Cancer Mother    Past Surgical History:  Procedure Laterality Date  . COLONOSCOPY N/A 01/23/2014  SL[facility]:The LEFT COLON IS EXTREMELY redundant/TWO RECTAL POLYPS REMOVED/SMALL VOLUME RECTAL BLEEDING MOST LIKELY DUE TO Small internal hemorrhoids  . ESOPHAGOGASTRODUODENOSCOPY N/A 01/23/2014   [facility]:FMMCRFVO ring at the gastroesophageal junction/Medium sized hiatal hernia/NDYSPEPSIA MOST LIKELY DUE TO GERD/MILD Non-erosive gastritis  . HEMORRHOID BANDING  01/23/2014   Procedure: HEMORRHOID BANDING;  Surgeon: Danie Binder, MD;  Location: [facility];  Service: Endoscopy;;  . MOUTH SURGERY    . TOTAL HIP ARTHROPLASTY Right 06/04/2016    Procedure: RIGHT TOTAL HIP ARTHROPLASTY ANTERIOR APPROACH;  Surgeon: Mcarthur Rossetti, MD;  Location: [facility];  Service: Orthopedics;  Laterality: Right;   Social History   Occupational History    Comment: [employer]  Tobacco Use  . Smoking status: Former Smoker    Types: Cigarettes    Last attempt to quit: 05/28/2014    Years since quitting: 3.6  . Smokeless tobacco: Never Used  . Tobacco comment: quit in November 2015 after smoking x 20 yrs.  Substance and Sexual Activity  . Alcohol use: No    Alcohol/week: 0.0 oz  . Drug use: No  . Sexual activity: Not on file

## 2018-01-13 ENCOUNTER — Other Ambulatory Visit (INDEPENDENT_AMBULATORY_CARE_PROVIDER_SITE_OTHER): Payer: Self-pay | Admitting: Orthopaedic Surgery

## 2018-01-13 ENCOUNTER — Telehealth (INDEPENDENT_AMBULATORY_CARE_PROVIDER_SITE_OTHER): Payer: Self-pay | Admitting: Orthopaedic Surgery

## 2018-01-13 MED ORDER — TRAMADOL HCL 50 MG PO TABS: 50.0000 mg | ORAL_TABLET | Freq: Three times a day (TID) | ORAL | 0 refills | Status: DC | PRN

## 2018-01-13 NOTE — Telephone Encounter (Signed)
I escribed in Tramadol to the [facility] in [place].

## 2018-01-13 NOTE — Telephone Encounter (Signed)
Patient called needing Rx for pain. Patient said he can't take anything that will harm his kidneys. The number to contact patient is [phone]

## 2018-01-13 NOTE — Telephone Encounter (Signed)
Please advise. Thanks.  

## 2018-01-20 DIAGNOSIS — N183 Chronic kidney disease, stage 3 (moderate): Secondary | ICD-10-CM | POA: Diagnosis not present

## 2018-01-20 DIAGNOSIS — Z79899 Other long term (current) drug therapy: Secondary | ICD-10-CM | POA: Diagnosis not present

## 2018-01-20 DIAGNOSIS — I10 Essential (primary) hypertension: Secondary | ICD-10-CM | POA: Diagnosis not present

## 2018-02-09 DIAGNOSIS — G4733 Obstructive sleep apnea (adult) (pediatric): Secondary | ICD-10-CM | POA: Diagnosis not present

## 2018-02-09 DIAGNOSIS — R748 Abnormal levels of other serum enzymes: Secondary | ICD-10-CM | POA: Diagnosis not present

## 2018-02-09 DIAGNOSIS — M169 Osteoarthritis of hip, unspecified: Secondary | ICD-10-CM | POA: Diagnosis not present

## 2018-02-09 DIAGNOSIS — Z79899 Other long term (current) drug therapy: Secondary | ICD-10-CM | POA: Diagnosis not present

## 2018-02-09 DIAGNOSIS — G894 Chronic pain syndrome: Secondary | ICD-10-CM | POA: Diagnosis not present

## 2018-02-09 DIAGNOSIS — Z6839 Body mass index (BMI) 39.0-39.9, adult: Secondary | ICD-10-CM | POA: Diagnosis not present

## 2018-02-09 DIAGNOSIS — E782 Mixed hyperlipidemia: Secondary | ICD-10-CM | POA: Diagnosis not present

## 2018-02-09 DIAGNOSIS — N179 Acute kidney failure, unspecified: Secondary | ICD-10-CM | POA: Diagnosis not present

## 2018-02-09 DIAGNOSIS — I10 Essential (primary) hypertension: Secondary | ICD-10-CM | POA: Diagnosis not present

## 2018-02-09 DIAGNOSIS — N183 Chronic kidney disease, stage 3 (moderate): Secondary | ICD-10-CM | POA: Diagnosis not present

## 2018-02-09 DIAGNOSIS — Z1389 Encounter for screening for other disorder: Secondary | ICD-10-CM | POA: Diagnosis not present

## 2018-03-08 ENCOUNTER — Observation Stay (HOSPITAL_COMMUNITY): Payer: BLUE CROSS/BLUE SHIELD

## 2018-03-08 ENCOUNTER — Emergency Department (HOSPITAL_COMMUNITY): Payer: BLUE CROSS/BLUE SHIELD

## 2018-03-08 ENCOUNTER — Observation Stay (HOSPITAL_COMMUNITY)
Admission: EM | Admit: 2018-03-08 | Discharge: 2018-03-08 | Disposition: A | Discharge status: Home / Self Care | Payer: BLUE CROSS/BLUE SHIELD | Attending: Internal Medicine | Admitting: Internal Medicine

## 2018-03-08 ENCOUNTER — Other Ambulatory Visit: Payer: Self-pay

## 2018-03-08 ENCOUNTER — Encounter (HOSPITAL_COMMUNITY): Payer: Self-pay | Admitting: Emergency Medicine

## 2018-03-08 DIAGNOSIS — N189 Chronic kidney disease, unspecified: Secondary | ICD-10-CM

## 2018-03-08 DIAGNOSIS — R110 Nausea: Secondary | ICD-10-CM | POA: Diagnosis not present

## 2018-03-08 DIAGNOSIS — F1011 Alcohol abuse, in remission: Secondary | ICD-10-CM

## 2018-03-08 DIAGNOSIS — N183 Chronic kidney disease, stage 3 unspecified: Secondary | ICD-10-CM | POA: Diagnosis present

## 2018-03-08 DIAGNOSIS — Z87891 Personal history of nicotine dependence: Secondary | ICD-10-CM | POA: Insufficient documentation

## 2018-03-08 DIAGNOSIS — I129 Hypertensive chronic kidney disease with stage 1 through stage 4 chronic kidney disease, or unspecified chronic kidney disease: Secondary | ICD-10-CM | POA: Diagnosis not present

## 2018-03-08 DIAGNOSIS — R072 Precordial pain: Secondary | ICD-10-CM | POA: Diagnosis not present

## 2018-03-08 DIAGNOSIS — F419 Anxiety disorder, unspecified: Secondary | ICD-10-CM | POA: Insufficient documentation

## 2018-03-08 DIAGNOSIS — Z96641 Presence of right artificial hip joint: Secondary | ICD-10-CM | POA: Diagnosis not present

## 2018-03-08 DIAGNOSIS — J449 Chronic obstructive pulmonary disease, unspecified: Secondary | ICD-10-CM | POA: Diagnosis not present

## 2018-03-08 DIAGNOSIS — R0789 Other chest pain: Secondary | ICD-10-CM | POA: Diagnosis not present

## 2018-03-08 DIAGNOSIS — R42 Dizziness and giddiness: Secondary | ICD-10-CM

## 2018-03-08 DIAGNOSIS — R079 Chest pain, unspecified: Secondary | ICD-10-CM | POA: Diagnosis not present

## 2018-03-08 DIAGNOSIS — R000 Tachycardia, unspecified: Secondary | ICD-10-CM | POA: Diagnosis not present

## 2018-03-08 DIAGNOSIS — F1010 Alcohol abuse, uncomplicated: Secondary | ICD-10-CM | POA: Insufficient documentation

## 2018-03-08 DIAGNOSIS — R0602 Shortness of breath: Secondary | ICD-10-CM | POA: Diagnosis not present

## 2018-03-08 DIAGNOSIS — I10 Essential (primary) hypertension: Secondary | ICD-10-CM

## 2018-03-08 DIAGNOSIS — R58 Hemorrhage, not elsewhere classified: Secondary | ICD-10-CM | POA: Diagnosis not present

## 2018-03-08 HISTORY — DX: Chronic kidney disease, stage 3 (moderate): N18.3

## 2018-03-08 LAB — CBC
HCT: 39.5 % (ref 39.0–52.0)
Hemoglobin: 12.5 g/dL — ABNORMAL LOW (ref 13.0–17.0)
MCH: 27.1 pg (ref 26.0–34.0)
MCHC: 31.6 g/dL (ref 30.0–36.0)
MCV: 85.7 fL (ref 78.0–100.0)
PLATELETS: 247 10*3/uL (ref 150–400)
RBC: 4.61 MIL/uL (ref 4.22–5.81)
RDW: 16.8 % — ABNORMAL HIGH (ref 11.5–15.5)
WBC: 9.1 10*3/uL (ref 4.0–10.5)

## 2018-03-08 LAB — BASIC METABOLIC PANEL
ANION GAP: 10 (ref 5–15)
Anion gap: 10 (ref 5–15)
BUN: 35 mg/dL — ABNORMAL HIGH (ref 6–20)
BUN: 29 mg/dL — ABNORMAL HIGH (ref 6–20)
CALCIUM: 9.2 mg/dL (ref 8.9–10.3)
CHLORIDE: 104 mmol/L (ref 98–111)
CO2: 23 mmol/L (ref 22–32)
CO2: 21 mmol/L — ABNORMAL LOW (ref 22–32)
CREATININE: 2.34 mg/dL — AB (ref 0.61–1.24)
CREATININE: 1.71 mg/dL — AB (ref 0.61–1.24)
Calcium: 9.4 mg/dL (ref 8.9–10.3)
Chloride: 108 mmol/L (ref 98–111)
GFR calc Af Amer: 34 mL/min — ABNORMAL LOW (ref >60)
GFR calc non Af Amer: 29 mL/min — ABNORMAL LOW (ref >60)
GFR, EST AFRICAN AMERICAN: 49 mL/min — AB (ref >60)
GFR, EST NON AFRICAN AMERICAN: 42 mL/min — AB (ref >60)
GLUCOSE: 122 mg/dL — AB (ref 70–99)
Glucose, Bld: 121 mg/dL — ABNORMAL HIGH (ref 70–99)
Potassium: 3.8 mmol/L (ref 3.5–5.1)
Potassium: 3.9 mmol/L (ref 3.5–5.1)
SODIUM: 139 mmol/L (ref 135–145)
Sodium: 137 mmol/L (ref 135–145)

## 2018-03-08 LAB — I-STAT TROPONIN, ED: Troponin i, poc: 0.01 ng/mL (ref 0.00–0.08)

## 2018-03-08 LAB — PROTIME-INR
INR: 1
Prothrombin Time: 13.1 seconds (ref 11.4–15.2)

## 2018-03-08 LAB — TROPONIN I
Troponin I: <0.03 ng/mL (ref <0.03)
Troponin I: <0.03 ng/mL (ref <0.03)
Troponin I: <0.03 ng/mL (ref <0.03)

## 2018-03-08 LAB — D-DIMER, QUANTITATIVE: D-Dimer, Quant: 0.69 ug/mL-FEU — ABNORMAL HIGH (ref 0.00–0.50)

## 2018-03-08 LAB — APTT: APTT: 35 s (ref 24–36)

## 2018-03-08 MED ORDER — SODIUM CHLORIDE 0.9 % IV BOLUS
500.0000 mL | Freq: Once | INTRAVENOUS | Status: AC
  Administered 2018-03-08: 500 mL via INTRAVENOUS

## 2018-03-08 MED ORDER — FOLIC ACID 1 MG PO TABS
1.0000 mg | ORAL_TABLET | Freq: Every day | ORAL | Status: DC
  Administered 2018-03-08: 1 mg via ORAL
  Filled 2018-03-08: qty 1

## 2018-03-08 MED ORDER — VITAMIN B-1 100 MG PO TABS
100.0000 mg | ORAL_TABLET | Freq: Every day | ORAL | Status: DC
  Administered 2018-03-08: 100 mg via ORAL
  Filled 2018-03-08: qty 1

## 2018-03-08 MED ORDER — FAMOTIDINE 20 MG PO TABS
20.0000 mg | ORAL_TABLET | Freq: Two times a day (BID) | ORAL | Status: DC
  Administered 2018-03-08: 20 mg via ORAL
  Filled 2018-03-08: qty 1

## 2018-03-08 MED ORDER — ONDANSETRON HCL 4 MG/2ML IJ SOLN: 4.0000 mg | Freq: Four times a day (QID) | INTRAMUSCULAR | Status: DC | PRN

## 2018-03-08 MED ORDER — AMLODIPINE BESYLATE 5 MG PO TABS
10.0000 mg | ORAL_TABLET | Freq: Every day | ORAL | Status: DC
  Administered 2018-03-08: 10 mg via ORAL
  Filled 2018-03-08: qty 2

## 2018-03-08 MED ORDER — ADULT MULTIVITAMIN W/MINERALS CH
1.0000 | ORAL_TABLET | Freq: Every day | ORAL | Status: DC
  Administered 2018-03-08: 1 via ORAL
  Filled 2018-03-08: qty 1

## 2018-03-08 MED ORDER — LOSARTAN POTASSIUM 25 MG PO TABS: 50.0000 mg | ORAL_TABLET | Freq: Every day | ORAL | Status: DC

## 2018-03-08 MED ORDER — HYDROCODONE-ACETAMINOPHEN 5-325 MG PO TABS: 1.0000 | ORAL_TABLET | Freq: Four times a day (QID) | ORAL | Status: DC | PRN

## 2018-03-08 MED ORDER — LORAZEPAM 2 MG/ML IJ SOLN: 1.0000 mg | Freq: Four times a day (QID) | INTRAMUSCULAR | Status: DC | PRN

## 2018-03-08 MED ORDER — SODIUM CHLORIDE 0.9 % IV SOLN: INTRAVENOUS | Status: DC

## 2018-03-08 MED ORDER — TECHNETIUM TO 99M ALBUMIN AGGREGATED
4.0000 | Freq: Once | INTRAVENOUS | Status: AC | PRN
  Administered 2018-03-08: 4.4 via INTRAVENOUS

## 2018-03-08 MED ORDER — ONDANSETRON HCL 4 MG PO TABS: 4.0000 mg | ORAL_TABLET | Freq: Four times a day (QID) | ORAL | Status: DC | PRN

## 2018-03-08 MED ORDER — LORAZEPAM 1 MG PO TABS: 1.0000 mg | ORAL_TABLET | Freq: Four times a day (QID) | ORAL | Status: DC | PRN

## 2018-03-08 MED ORDER — GABAPENTIN 100 MG PO CAPS
100.0000 mg | ORAL_CAPSULE | Freq: Three times a day (TID) | ORAL | Status: DC
  Administered 2018-03-08: 100 mg via ORAL
  Filled 2018-03-08: qty 1

## 2018-03-08 MED ORDER — THIAMINE HCL 100 MG/ML IJ SOLN: 100.0000 mg | Freq: Every day | INTRAMUSCULAR | Status: DC

## 2018-03-08 MED ORDER — MOMETASONE FURO-FORMOTEROL FUM 200-5 MCG/ACT IN AERO
2.0000 | INHALATION_SPRAY | Freq: Two times a day (BID) | RESPIRATORY_TRACT | Status: DC
  Filled 2018-03-08: qty 8.8

## 2018-03-08 MED ORDER — ACETAMINOPHEN 650 MG RE SUPP: 650.0000 mg | Freq: Four times a day (QID) | RECTAL | Status: DC | PRN

## 2018-03-08 MED ORDER — SODIUM CHLORIDE 0.9 % IV SOLN
INTRAVENOUS | Status: DC
  Administered 2018-03-08: 06:00:00 via INTRAVENOUS

## 2018-03-08 MED ORDER — TECHNETIUM TC 99M DIETHYLENETRIAME-PENTAACETIC ACID
30.0000 | Freq: Once | INTRAVENOUS | Status: AC | PRN
  Administered 2018-03-08: 33 via RESPIRATORY_TRACT

## 2018-03-08 MED ORDER — ASPIRIN 81 MG PO CHEW
81.0000 mg | CHEWABLE_TABLET | Freq: Two times a day (BID) | ORAL | Status: DC
  Administered 2018-03-08: 81 mg via ORAL
  Filled 2018-03-08: qty 1

## 2018-03-08 MED ORDER — HEPARIN (PORCINE) IN NACL 100-0.45 UNIT/ML-% IJ SOLN
1600.0000 [IU]/h | INTRAMUSCULAR | Status: DC
  Administered 2018-03-08: 1600 [IU]/h via INTRAVENOUS
  Filled 2018-03-08: qty 250

## 2018-03-08 MED ORDER — HEPARIN SODIUM (PORCINE) 5000 UNIT/ML IJ SOLN: 5000.0000 [IU] | Freq: Three times a day (TID) | INTRAMUSCULAR | Status: DC

## 2018-03-08 MED ORDER — ALBUTEROL SULFATE (2.5 MG/3ML) 0.083% IN NEBU: 3.0000 mL | INHALATION_SOLUTION | Freq: Four times a day (QID) | RESPIRATORY_TRACT | Status: DC | PRN

## 2018-03-08 MED ORDER — ACETAMINOPHEN 325 MG PO TABS: 650.0000 mg | ORAL_TABLET | Freq: Four times a day (QID) | ORAL | Status: DC | PRN

## 2018-03-08 MED ORDER — HEPARIN BOLUS VIA INFUSION
5000.0000 [IU] | Freq: Once | INTRAVENOUS | Status: AC
  Administered 2018-03-08: 5000 [IU] via INTRAVENOUS

## 2018-03-08 MED ORDER — ORPHENADRINE CITRATE ER 100 MG PO TB12
100.0000 mg | ORAL_TABLET | Freq: Two times a day (BID) | ORAL | Status: DC
  Filled 2018-03-08 (×5): qty 1

## 2018-03-08 NOTE — Discharge Summary (Signed)
Physician Discharge Summary  Klaus Casteneda SVX:[id number] DOB: 07-Apr-1961 DOA: 03/08/2018  PCP: [name], MD  Admit date: 03/08/2018 Discharge date: 03/08/2018  Admitted From: Home Disposition: Home  Recommendations for Outpatient Follow-up:  1. Follow up with PCP in 1-2 weeks 2. Please obtain BMP/CBC in one week 3. Patient is been scheduled to follow-up with cardiology 4. Follow-up with nephrology as previously scheduled  Discharge Condition: Stable CODE STATUS: Full code Diet recommendation: Heart healthy  Brief/Interim Summary: 57 year old with CKD stage 3, HTN, alcohol abuse, COPD presented with complaints of chest pain.  Patient reports that he began having symptoms at work.  He was working in a very hot room and began having tightness in his chest.  EMS was called and reports some improvement after receiving some nitroglycerin.  He was brought to the ER for evaluation where EKG did not show any acute changes.  He did not have significant elevation of troponin.  D-dimer was mildly elevated.  He underwent VQ scan that was found to be low probability for PE.  Patient did not have any recurrence of symptoms since admission.  He suspects that he is chest tightness may be related to COPD while he was working in the very hot room.  He is been set up to see cardiology as an outpatient.  Regarding his chronic kidney disease, creatinine is currently at baseline.  He plans to follow-up with nephrology as an outpatient.  Patient otherwise feels better and is ready for discharge home.  NSAIDs and ARB have been discontinued until he follows up with his nephrologist.  Discharge Diagnoses:  Active Problems:   Essential hypertension, benign   History of alcohol abuse   Chest pain   CKD (chronic kidney disease), stage III (HCC)   COPD (chronic obstructive pulmonary disease) ([facility])    Discharge Instructions  Discharge Instructions    Diet - low sodium heart healthy   Complete by:  As  directed    Increase activity slowly   Complete by:  As directed      Allergies as of 03/08/2018   No Known Allergies     Medication List    STOP taking these medications   amoxicillin-clavulanate 875-125 MG tablet Commonly known as:  AUGMENTIN   losartan 50 MG tablet Commonly known as:  COZAAR   nabumetone 500 MG tablet Commonly known as:  RELAFEN   naproxen sodium 220 MG tablet Commonly known as:  ALEVE     TAKE these medications   acetaminophen 650 MG CR tablet Commonly known as:  TYLENOL Take 1,300 mg by mouth every 8 (eight) hours as needed for pain.   albuterol 108 (90 Base) MCG/ACT inhaler Commonly known as:  PROVENTIL HFA;VENTOLIN HFA Inhale 1-2 puffs into the lungs every 6 (six) hours as needed for wheezing or shortness of breath.   amLODipine 10 MG tablet Commonly known as:  NORVASC Take 10 mg by mouth daily.   aspirin 81 MG chewable tablet Chew 1 tablet (81 mg total) by mouth 2 (two) times daily. What changed:  Another medication with the same name was removed. Continue taking this medication, and follow the directions you see here.   chlorpheniramine-HYDROcodone 10-8 MG/5ML Suer Commonly known as:  TUSSIONEX TK 5MLS PO BID PRN   diphenhydrAMINE 25 MG tablet Commonly known as:  BENADRYL Take 1 tablet (25 mg total) by mouth every 6 (six) hours. What changed:  Another medication with the same name was removed. Continue taking this medication, and follow the directions you  see here.   famotidine 20 MG tablet Commonly known as:  PEPCID Take 1 tablet (20 mg total) by mouth 2 (two) times daily. What changed:  Another medication with the same name was removed. Continue taking this medication, and follow the directions you see here.   gabapentin 100 MG capsule Commonly known as:  NEURONTIN Take 1 capsule (100 mg total) by mouth 3 (three) times daily.   HYDROcodone-acetaminophen 5-325 MG tablet Commonly known as:  NORCO/VICODIN Take 1 tablet by mouth  every 6 (six) hours as needed for moderate pain.   methocarbamol 500 MG tablet Commonly known as:  ROBAXIN Take 1 tablet (500 mg total) by mouth every 6 (six) hours as needed for muscle spasms.   orphenadrine 100 MG tablet Commonly known as:  NORFLEX Take 1 tablet (100 mg total) by mouth 2 (two) times daily.   SYMBICORT 160-4.5 MCG/ACT inhaler Generic drug:  budesonide-formoterol INL 2 PUFFS PO BID   traMADol 50 MG tablet Commonly known as:  ULTRAM Take 1-2 tablets (50-100 mg total) by mouth 3 (three) times daily as needed.      Follow-up Information    Satira Sark, MD Follow up on 04/03/2018.   Specialty:  Cardiology Why:  9:00am Contact information: [place] [address] [phone]          No Known Allergies  Consultations:     Procedures/Studies: Dg Chest 2 View  Result Date: 03/08/2018 CLINICAL DATA:  Left-sided chest pain and shortness of breath while at work today. History of bronchitis and hypertension. Former smoker. EXAM: CHEST - 2 VIEW COMPARISON:  11/22/2017 FINDINGS: Heart size and pulmonary vascularity are normal. There is central interstitial change with mild peribronchial thickening and mild central bronchiectasis likely indicating chronic bronchitic changes. Emphysematous changes in the upper lungs. No airspace disease or consolidation. No blunting of costophrenic angles. No pneumothorax. Mediastinal contours appear intact. Calcification of the aorta. Similar appearance to previous study. IMPRESSION: Emphysematous and chronic bronchitic changes in the lungs. No evidence of active pulmonary disease. Electronically Signed   By: Lucienne Capers M.D.   On: 03/08/2018 03:43   Nm Pulmonary Perf And Vent  Result Date: 03/08/2018 CLINICAL DATA:  Shortness of breath and chest pain EXAM: NUCLEAR MEDICINE VENTILATION - PERFUSION LUNG SCAN VIEWS: Anterior, posterior, left lateral, right lateral, RPO, LPO, RAO, LAO-ventilation and perfusion  RADIOPHARMACEUTICALS:  33.0 mCi of Tc-79m DTPA aerosol inhalation and 4.4 mCi Tc81m-MAA IV COMPARISON:  Chest radiograph March 08, 2018 FINDINGS: Ventilation: Radiotracer uptake is homogeneous and symmetric bilaterally. No ventilation defects are demonstrable. Perfusion: Radiotracer uptake is homogeneous and symmetric bilaterally. No appreciable perfusion defects. IMPRESSION: No appreciable ventilation or perfusion defects. This study constitutes a very low probability of pulmonary embolus. Electronically Signed   By: Lowella Grip III M.D.   On: 03/08/2018 10:32      Subjective: Feeling better.  No recurrent chest pain since admission.  Discharge Exam: Vitals:   03/08/18 0600 03/08/18 1505  BP: 127/89 128/84  Pulse: 81 87  Resp: 18 20  Temp:  97.8 F (36.6 C)  SpO2: 95% 94%   Vitals:   03/08/18 0430 03/08/18 0500 03/08/18 0600 03/08/18 1505  BP: (!) 144/103 126/78 127/89 128/84  Pulse: 82 81 81 87  Resp: 12 18 18 20   Temp:    97.8 F (36.6 C)  TempSrc:    Oral  SpO2: 93% 93% 95% 94%  Weight:   114.2 kg (251 lb 12.3 oz)   Height:  5\' 8"  (1.727 m)     General: Pt is alert, awake, not in acute distress Cardiovascular: RRR, S1/S2 +, no rubs, no gallops Respiratory: CTA bilaterally, no wheezing, no rhonchi Abdominal: Soft, NT, ND, bowel sounds + Extremities: no edema, no cyanosis    The results of significant diagnostics from this hospitalization (including imaging, microbiology, ancillary and laboratory) are listed below for reference.     Microbiology: No results found for this or any previous visit (from the past 240 hour(s)).   Labs: BNP (last 3 results) No results for input(s): BNP in the last 8760 hours. Basic Metabolic Panel: Recent Labs  Lab 03/08/18 0317 03/08/18 1445  NA 137 139  K 3.8 3.9  CL 104 108  CO2 23 21*  GLUCOSE 122* 121*  BUN 35* 29*  CREATININE 2.34* 1.71*  CALCIUM 9.4 9.2   Liver Function Tests: No results for input(s): AST, ALT,  ALKPHOS, BILITOT, PROT, ALBUMIN in the last 168 hours. No results for input(s): LIPASE, AMYLASE in the last 168 hours. No results for input(s): AMMONIA in the last 168 hours. CBC: Recent Labs  Lab 03/08/18 0317  WBC 9.1  HGB 12.5*  HCT 39.5  MCV 85.7  PLT 247   Cardiac Enzymes: Recent Labs  Lab 03/08/18 0518 03/08/18 1303  TROPONINI <0.03 <0.03   BNP: Invalid input(s): POCBNP CBG: No results for input(s): GLUCAP in the last 168 hours. D-Dimer Recent Labs    03/08/18 0317  DDIMER 0.69*   Hgb A1c No results for input(s): HGBA1C in the last 72 hours. Lipid Profile No results for input(s): CHOL, HDL, LDLCALC, TRIG, CHOLHDL, LDLDIRECT in the last 72 hours. Thyroid function studies No results for input(s): TSH, T4TOTAL, T3FREE, THYROIDAB in the last 72 hours.  Invalid input(s): FREET3 Anemia work up No results for input(s): VITAMINB12, FOLATE, FERRITIN, TIBC, IRON, RETICCTPCT in the last 72 hours. Urinalysis    Component Value Date/Time   COLORURINE YELLOW 07/06/2013 1024   APPEARANCEUR CLEAR 07/06/2013 1024   LABSPEC >1.030 (H) 07/06/2013 1024   PHURINE 5.5 07/06/2013 1024   GLUCOSEU NEGATIVE 07/06/2013 1024   HGBUR NEGATIVE 07/06/2013 1024   BILIRUBINUR NEGATIVE 07/06/2013 1024   KETONESUR NEGATIVE 07/06/2013 1024   PROTEINUR 100 (A) 07/06/2013 1024   UROBILINOGEN 0.2 07/06/2013 1024   NITRITE NEGATIVE 07/06/2013 1024   LEUKOCYTESUR NEGATIVE 07/06/2013 1024   Sepsis Labs Invalid input(s): PROCALCITONIN,  WBC,  LACTICIDVEN Microbiology No results found for this or any previous visit (from the past 240 hour(s)).   Time coordinating discharge: 60mins  SIGNED:   Kathie Dike, MD  [facility] 03/08/2018, 5:10 PM Pager   If 7PM-7AM, please contact night-coverage [email or web address] Password [email or web address]

## 2018-03-08 NOTE — Progress Notes (Signed)
ANTICOAGULATION CONSULT NOTE - Preliminary  Pharmacy Consult for heparin Indication: pulmonary embolus  No Known Allergies  Patient Measurements: Height: 5\' 8"  (172.7 cm) Weight: 260 lb (117.9 kg) IBW/kg (Calculated) : 68.4 HEPARIN DW (KG): 95.2   Vital Signs: Temp: 98.4 F (36.9 C) (07/03 0307) Temp Source: Oral (07/03 0307) BP: 138/100 (07/03 0400) Pulse Rate: 90 (07/03 0400)  Labs: Recent Labs    03/08/18 0317  HGB 12.5*  HCT 39.5  PLT 247  CREATININE 2.34*   Estimated Creatinine Clearance: 42.9 mL/min (A) (by C-G formula based on SCr of 2.34 mg/dL (H)).  Medical History: Past Medical History:  Diagnosis Date  . Alcohol abuse   . Anxiety   . Bronchitis   . Chronic hip pain   . Chronic kidney disease (CKD) stage G3b/A3, moderately decreased glomerular filtration rate (GFR) between 30-44 mL/min/1.73 square meter and albuminuria creatinine ratio greater than 300 mg/g ([place]) 01/03/2018  . Hepatitis C   . Hypertension   . Mixed hyperlipidemia   . Renal disorder    states renal dysfunction, "strained kidney"  . Sleep apnea     Medications:  Scheduled:  . heparin  5,000 Units Intravenous Once   Infusions:  . heparin    . sodium chloride 500 mL (03/08/18 0334)   PRN:  Anti-infectives (From admission, onward)   None      Assessment: 57 yo male with HTN, HLD, and COPD presented to ED for L side chest pain.  He took 324 mg ASA already.  Is not on any anticoagulants.  Starting heparin for PE. Baseline labs ordered.   Goal of Therapy:  Heparin level 0.3-0.7 units/ml   Plan:  Give 5000 units bolus x 1 Start heparin infusion at 1600 units/hr Check anti-Xa level in 6 hours and daily while on heparin Continue to monitor H&H and platelets Preliminary review of pertinent patient information completed.  Forestine Na clinical pharmacist will complete review during morning rounds to assess the patient and finalize treatment regimen.  Trendon Zaring, [place],  [place] 03/08/2018,4:10 AM

## 2018-03-08 NOTE — ED Provider Notes (Addendum)
Emergency Department Provider Note   I have reviewed the triage vital signs and the nursing notes.   HISTORY  Chief Complaint Chest Pain   HPI Mathew Lara is a 57 y.o. male with PMH of HTN, HepC, HLD, and COPD presents to the emergency department for evaluation of acute onset left-sided chest pain/pressure which began at 2 AM.  Patient states he was at work when symptoms began.  He denies any exertional or pleuritic component to the pain.  He took 324 mg of aspirin and called EMS.  When EMS arrived they gave 1 nitroglycerin and his chest pain resolved.  He notes a remote history of pain in the past which prompted an emergency department visit but denies any history of stress testing or PCI.  Denies any cough or fever.  No abdominal discomfort.  No vomiting or nausea.  No diaphoresis. Denies any PE/DVT history. He dose note that around the time of the pain he felt lightheaded but did not experience syncope.   Past Medical History:  Diagnosis Date  . Alcohol abuse   . Anxiety   . Bronchitis   . Chronic hip pain   . Chronic kidney disease (CKD) stage G3b/A3, moderately decreased glomerular filtration rate (GFR) between 30-44 mL/min/1.73 square meter and albuminuria creatinine ratio greater than 300 mg/g ([place]) 01/03/2018  . Hepatitis C   . Hypertension   . Mixed hyperlipidemia   . Renal disorder    states renal dysfunction, "strained kidney"  . Sleep apnea     Patient Active Problem List   Diagnosis Date Noted  . Avascular necrosis of bone of left hip ([place]) 07/27/2017  . Avascular necrosis of bone of right hip ([place]) 06/04/2016  . Status post total replacement of right hip 06/04/2016  . Hepatitis C 03/21/2015  . History of alcohol abuse 11/08/2013  . Tobacco abuse 11/08/2013  . Essential hypertension, benign 11/07/2013  . Abnormal ECG 11/07/2013    Past Surgical History:  Procedure Laterality Date  . COLONOSCOPY N/A 01/23/2014   SLF:The LEFT COLON IS EXTREMELY  redundant/TWO RECTAL POLYPS REMOVED/SMALL VOLUME RECTAL BLEEDING MOST LIKELY DUE TO Small internal hemorrhoids  . ESOPHAGOGASTRODUODENOSCOPY N/A 01/23/2014   MOQ:HUTMLYYT ring at the gastroesophageal junction/Medium sized hiatal hernia/NDYSPEPSIA MOST LIKELY DUE TO GERD/MILD Non-erosive gastritis  . HEMORRHOID BANDING  01/23/2014   Procedure: HEMORRHOID BANDING;  Surgeon: Danie Binder, MD;  Location: [facility];  Service: Endoscopy;;  . MOUTH SURGERY    . TOTAL HIP ARTHROPLASTY Right 06/04/2016   Procedure: RIGHT TOTAL HIP ARTHROPLASTY ANTERIOR APPROACH;  Surgeon: Mcarthur Rossetti, MD;  Location: [facility];  Service: Orthopedics;  Laterality: Right;    Allergies Patient has no known allergies.  Family History  Problem Relation Age of Onset  . Cancer Mother     Social History Social History   Tobacco Use  . Smoking status: Former Smoker    Types: Cigarettes    Last attempt to quit: 05/28/2014    Years since quitting: 3.7  . Smokeless tobacco: Never Used  . Tobacco comment: quit in November 2015 after smoking x 20 yrs.  Substance Use Topics  . Alcohol use: No    Alcohol/week: 0.0 oz  . Drug use: No    Review of Systems  Constitutional: No fever/chills. Positive lightheadedness.  Eyes: No visual changes. ENT: No sore throat. Cardiovascular: Positive chest pain. Respiratory: Denies shortness of breath. Gastrointestinal: No abdominal pain.  No nausea, no vomiting.  No diarrhea.  No constipation. Genitourinary: Negative  for dysuria. Musculoskeletal: Negative for back pain. Skin: Negative for rash. Neurological: Negative for headaches, focal weakness or numbness.  10-point ROS otherwise negative.  ____________________________________________   PHYSICAL EXAM:  VITAL SIGNS: ED Triage Vitals  Enc Vitals Group     BP 03/08/18 0307 (!) 141/106     Pulse Rate 03/08/18 0307 94     Resp 03/08/18 0307 18     Temp 03/08/18 0307 98.4 F (36.9 C)     Temp Source  03/08/18 0307 Oral     SpO2 03/08/18 0307 93 %     Weight 03/08/18 0300 260 lb (117.9 kg)     Height 03/08/18 0300 5\' 8"  (1.727 m)     Pain Score 03/08/18 0300 5   Constitutional: Alert and oriented. Well appearing and in no acute distress. Eyes: Conjunctivae are normal.  Head: Atraumatic. Nose: No congestion/rhinnorhea. Mouth/Throat: Mucous membranes are moist.  Neck: No stridor.  Cardiovascular: Sinus tachycardia on monitor. Good peripheral circulation. Grossly normal heart sounds.   Respiratory: Normal respiratory effort.  No retractions. Lungs CTAB. Gastrointestinal: Soft and nontender. No distention.  Musculoskeletal: No lower extremity tenderness nor edema. No gross deformities of extremities. Neurologic:  Normal speech and language. No gross focal neurologic deficits are appreciated.  Skin:  Skin is warm, dry and intact. No rash noted.  ____________________________________________   LABS (all labs ordered are listed, but only abnormal results are displayed)  Labs Reviewed  BASIC METABOLIC PANEL - Abnormal; Notable for the following components:      Result Value   Glucose, Bld 122 (*)    BUN 35 (*)    Creatinine, Ser 2.34 (*)    GFR calc non Af Amer 29 (*)    GFR calc Af Amer 34 (*)    All other components within normal limits  CBC - Abnormal; Notable for the following components:   Hemoglobin 12.5 (*)    RDW 16.8 (*)    All other components within normal limits  D-DIMER, QUANTITATIVE (NOT AT [facility]) - Abnormal; Notable for the following components:   D-Dimer, Quant 0.69 (*)    All other components within normal limits  APTT  PROTIME-INR  I-STAT TROPONIN, ED   ____________________________________________  EKG   EKG Interpretation  Date/Time:  Wednesday March 08 2018 03:00:42 EDT Ventricular Rate:  97 PR Interval:    QRS Duration: 72 QT Interval:  337 QTC Calculation: 428 R Axis:   30 Text Interpretation:  Sinus rhythm No STEMI.  Confirmed by Nanda Quinton  [phone]) on 03/08/2018 3:08:12 AM       ____________________________________________  RADIOLOGY  Dg Chest 2 View  Result Date: 03/08/2018 CLINICAL DATA:  Left-sided chest pain and shortness of breath while at work today. History of bronchitis and hypertension. Former smoker. EXAM: CHEST - 2 VIEW COMPARISON:  11/22/2017 FINDINGS: Heart size and pulmonary vascularity are normal. There is central interstitial change with mild peribronchial thickening and mild central bronchiectasis likely indicating chronic bronchitic changes. Emphysematous changes in the upper lungs. No airspace disease or consolidation. No blunting of costophrenic angles. No pneumothorax. Mediastinal contours appear intact. Calcification of the aorta. Similar appearance to previous study. IMPRESSION: Emphysematous and chronic bronchitic changes in the lungs. No evidence of active pulmonary disease. Electronically Signed   By: Lucienne Capers M.D.   On: 03/08/2018 03:43    ____________________________________________   PROCEDURES  Procedure(s) performed:   Procedures  CRITICAL CARE Performed by: Margette Fast Total critical care time: 35 minutes Critical care time was  exclusive of separately billable procedures and treating other patients. Critical care was necessary to treat or prevent imminent or life-threatening deterioration. Critical care was time spent personally by me on the following activities: development of treatment plan with patient and/or surrogate as well as nursing, discussions with consultants, evaluation of patient's response to treatment, examination of patient, obtaining history from patient or surrogate, ordering and performing treatments and interventions, ordering and review of laboratory studies, ordering and review of radiographic studies, pulse oximetry and re-evaluation of patient's condition.  Nanda Quinton, MD Emergency Medicine  ____________________________________________   INITIAL  IMPRESSION / ASSESSMENT AND PLAN / ED COURSE  Pertinent labs & imaging results that were available during my care of the patient were reviewed by me and considered in my medical decision making (see chart for details).  Patient presents to the emergency department for evaluation of acute onset left-sided chest pressure/pain with associated lightheadedness.  Symptoms resolved with nitroglycerin in route.  He is currently chest pain-free.  He does have several cardiovascular risk factors including hypertension and hyperlipidemia.  Patient does have elevated BMI as well. HEART score 5 (Story 1, EKG 1, Age 52, Risk factors 2). Will send d-dimer with tachycardia, CP, and near-syncope. Patient intermediate risk for PE by wells.   Differential includes all life-threatening causes for chest pain. This includes but is not exclusive to acute coronary syndrome, aortic dissection, pulmonary embolism, cardiac tamponade, community-acquired pneumonia, pericarditis, musculoskeletal chest wall pain, etc.  03:55 AM Patient with normal troponin. D-dimer is slightly elevated. Unfortunately the patient's creatinine is elevated with GFR to the point where CTA is not able to be performed. Plan to start heparin for PE coverage. Patient may require V/Q scan in the AM with continued enzyme trending and consideration of provocative testing. Will discuss with the hospitalist.   Discussed patient's case with Hospitalist, Dr. Darrick Meigs to request admission. Patient and family (if present) updated with plan. Care transferred to Hospitalist service.  I reviewed all nursing notes, vitals, pertinent old records, EKGs, labs, imaging (as available).   ____________________________________________  FINAL CLINICAL IMPRESSION(S) / ED DIAGNOSES  Final diagnoses:  Precordial chest pain  Chronic kidney disease, unspecified CKD stage  Lightheadedness     MEDICATIONS GIVEN DURING THIS VISIT:  Medications  sodium chloride 0.9 % bolus  500 mL (500 mLs Intravenous New Bag/Given 03/08/18 0334)  heparin bolus via infusion 5,000 Units (5,000 Units Intravenous Bolus from Bag 03/08/18 0424)    Followed by  heparin ADULT infusion 100 units/mL (25000 units/268mL sodium chloride 0.45%) (1,600 Units/hr Intravenous New Bag/Given 03/08/18 0424)    Note:  This document was prepared using Dragon voice recognition software and may include unintentional dictation errors.  Nanda Quinton, MD Emergency Medicine    Hersey Maclellan, [facility], MD 03/08/18 [id number]    Margette Fast, MD 03/20/18 [phone]

## 2018-03-08 NOTE — Progress Notes (Signed)
Mathew Lara discharged Home per MD order.  Discharge instructions reviewed and discussed with the patient, all questions and concerns answered. Copy of instructions and scripts given to patient.  Allergies as of 03/08/2018   No Known Allergies     Medication List    STOP taking these medications   amoxicillin-clavulanate 875-125 MG tablet Commonly known as:  AUGMENTIN   losartan 50 MG tablet Commonly known as:  COZAAR   nabumetone 500 MG tablet Commonly known as:  RELAFEN   naproxen sodium 220 MG tablet Commonly known as:  ALEVE     TAKE these medications   acetaminophen 650 MG CR tablet Commonly known as:  TYLENOL Take 1,300 mg by mouth every 8 (eight) hours as needed for pain.   albuterol 108 (90 Base) MCG/ACT inhaler Commonly known as:  PROVENTIL HFA;VENTOLIN HFA Inhale 1-2 puffs into the lungs every 6 (six) hours as needed for wheezing or shortness of breath.   amLODipine 10 MG tablet Commonly known as:  NORVASC Take 10 mg by mouth daily.   aspirin 81 MG chewable tablet Chew 1 tablet (81 mg total) by mouth 2 (two) times daily. What changed:  Another medication with the same name was removed. Continue taking this medication, and follow the directions you see here.   chlorpheniramine-HYDROcodone 10-8 MG/5ML Suer Commonly known as:  TUSSIONEX TK 5MLS PO BID PRN   diphenhydrAMINE 25 MG tablet Commonly known as:  BENADRYL Take 1 tablet (25 mg total) by mouth every 6 (six) hours. What changed:  Another medication with the same name was removed. Continue taking this medication, and follow the directions you see here.   famotidine 20 MG tablet Commonly known as:  PEPCID Take 1 tablet (20 mg total) by mouth 2 (two) times daily. What changed:  Another medication with the same name was removed. Continue taking this medication, and follow the directions you see here.   gabapentin 100 MG capsule Commonly known as:  NEURONTIN Take 1 capsule (100 mg total) by mouth 3  (three) times daily.   HYDROcodone-acetaminophen 5-325 MG tablet Commonly known as:  NORCO/VICODIN Take 1 tablet by mouth every 6 (six) hours as needed for moderate pain.   methocarbamol 500 MG tablet Commonly known as:  ROBAXIN Take 1 tablet (500 mg total) by mouth every 6 (six) hours as needed for muscle spasms.   orphenadrine 100 MG tablet Commonly known as:  NORFLEX Take 1 tablet (100 mg total) by mouth 2 (two) times daily.   SYMBICORT 160-4.5 MCG/ACT inhaler Generic drug:  budesonide-formoterol INL 2 PUFFS PO BID   traMADol 50 MG tablet Commonly known as:  ULTRAM Take 1-2 tablets (50-100 mg total) by mouth 3 (three) times daily as needed.       Patients skin is clean, dry and intact, no evidence of skin break down. IV site discontinued and catheter remains intact. Site without signs and symptoms of complications. Dressing and pressure applied.  Patient ambulated to elevator,  no distress noted upon discharge.  Mathew Lara 03/08/2018 7:16 PM

## 2018-03-08 NOTE — ED Triage Notes (Signed)
Pt brought in by [facility]. Pt C/O left sided chest pain that started around 0200 tonight at work. Pt given 1 nitro with relief. Pt self administered ASA 324mg . Pt states after 1 nitro his pain went from a 7 to a 5.

## 2018-03-08 NOTE — H&P (Signed)
TRH H&P    Patient Demographics:    Mathew Lara, is a 57 y.o. male  MRN: [id number]  DOB - 1961-03-06  Admit Date - 03/08/2018  Referring MD/NP/PA: Dr Laverta Baltimore  Outpatient Primary MD for the patient is [name], MD  Patient coming from: Home  Chief complaint- Chest pain   HPI:    Mathew Lara  is a 57 y.o. male, with history of hypertension, hepatitis C, hyperlipidemia, COPD came to hospital with complaint of chest pain which began around 2 AM.  Patient says that he was at work when symptoms began.  He denies shortness of breath.  No nausea vomiting or diarrhea.  Patient called EMS he was given 1 dose of nitroglycerin and pain is improved.  Denies any fever or chills.  Denies coughing up any phlegm. In the ED initial troponin was negative, also had mild elevation of d-dimer 0.69. Patient has history of CKD stage III, CT Angio of the chest was not done due to renal insufficiency. VQ scan has been ordered.  Patient started on IV heparin empirically for elevated d-dimer. No previous history of DVT/PE No prior history of stroke or seizures no history of CAD    Review of systems:      All other systems reviewed and are negative.   With Past History of the following :    Past Medical History:  Diagnosis Date  . Alcohol abuse   . Anxiety   . Bronchitis   . Chronic hip pain   . Chronic kidney disease (CKD) stage G3b/A3, moderately decreased glomerular filtration rate (GFR) between 30-44 mL/min/1.73 square meter and albuminuria creatinine ratio greater than 300 mg/g ([place]) 01/03/2018  . Hepatitis C   . Hypertension   . Mixed hyperlipidemia   . Renal disorder    states renal dysfunction, "strained kidney"  . Sleep apnea       Past Surgical History:  Procedure Laterality Date  . COLONOSCOPY N/A 01/23/2014   SLF:The LEFT COLON IS EXTREMELY redundant/TWO RECTAL POLYPS REMOVED/SMALL VOLUME RECTAL  BLEEDING MOST LIKELY DUE TO Small internal hemorrhoids  . ESOPHAGOGASTRODUODENOSCOPY N/A 01/23/2014   WGN:FAOZHYQM ring at the gastroesophageal junction/Medium sized hiatal hernia/NDYSPEPSIA MOST LIKELY DUE TO GERD/MILD Non-erosive gastritis  . HEMORRHOID BANDING  01/23/2014   Procedure: HEMORRHOID BANDING;  Surgeon: Danie Binder, MD;  Location: [facility];  Service: Endoscopy;;  . MOUTH SURGERY    . TOTAL HIP ARTHROPLASTY Right 06/04/2016   Procedure: RIGHT TOTAL HIP ARTHROPLASTY ANTERIOR APPROACH;  Surgeon: Mcarthur Rossetti, MD;  Location: [facility];  Service: Orthopedics;  Laterality: Right;      Social History:      Social History   Tobacco Use  . Smoking status: Former Smoker    Types: Cigarettes    Last attempt to quit: 05/28/2014    Years since quitting: 3.7  . Smokeless tobacco: Never Used  . Tobacco comment: quit in November 2015 after smoking x 20 yrs.  Substance Use Topics  . Alcohol use: No    Alcohol/week: 0.0 oz  Family History :     Family History  Problem Relation Age of Onset  . Cancer Mother       Home Medications:   Prior to Admission medications   Medication Sig Start Date End Date Taking? Authorizing Provider  acetaminophen (TYLENOL) 650 MG CR tablet Take 1,300 mg by mouth every 8 (eight) hours as needed for pain.    [provider]  albuterol (PROVENTIL HFA;VENTOLIN HFA) 108 (90 Base) MCG/ACT inhaler Inhale 1-2 puffs into the lungs every 6 (six) hours as needed for wheezing or shortness of breath.    [provider]  amLODipine (NORVASC) 10 MG tablet Take 10 mg by mouth daily.    [provider]  amoxicillin-clavulanate (AUGMENTIN) 875-125 MG tablet TK 1 T PO BID FOR 10 DAYS 11/22/17   [provider]  aspirin 81 MG chewable tablet Chew 1 tablet (81 mg total) by mouth 2 (two) times daily. 06/06/16   Mcarthur Rossetti, MD  aspirin 81 MG tablet Take by mouth.    [provider]    chlorpheniramine-HYDROcodone (TUSSIONEX) 10-8 MG/5ML SUER TK 5MLS PO BID PRN 06/30/17   [provider]  diphenhydrAMINE (BENADRYL) 25 MG tablet Take 1 tablet (25 mg total) by mouth every 6 (six) hours. 01/05/17   Lily Kocher, PA-C  diphenhydrAMINE (BENADRYL) 25 MG tablet Take 1 tablet (25 mg total) by mouth every 6 (six) hours. 01/05/17   Lily Kocher, PA-C  famotidine (PEPCID) 20 MG tablet Take 1 tablet (20 mg total) by mouth 2 (two) times daily. 01/05/17   Lily Kocher, PA-C  famotidine (PEPCID) 20 MG tablet Take 1 tablet (20 mg total) by mouth 2 (two) times daily. 01/05/17   Lily Kocher, PA-C  gabapentin (NEURONTIN) 100 MG capsule Take 1 capsule (100 mg total) by mouth 3 (three) times daily. 11/19/15   Carole Civil, MD  HYDROcodone-acetaminophen (NORCO/VICODIN) 5-325 MG tablet Take 1 tablet by mouth every 6 (six) hours as needed for moderate pain. 07/21/16   Mcarthur Rossetti, MD  losartan (COZAAR) 50 MG tablet Take 50 mg by mouth daily.    [provider]  methocarbamol (ROBAXIN) 500 MG tablet Take 1 tablet (500 mg total) by mouth every 6 (six) hours as needed for muscle spasms. 07/20/16   Mcarthur Rossetti, MD  nabumetone (RELAFEN) 500 MG tablet Take 1 tablet (500 mg total) by mouth 2 (two) times daily. 12/23/15   Carole Civil, MD  naproxen sodium (ANAPROX) 220 MG tablet Take 440 mg by mouth daily.    [provider]  orphenadrine (NORFLEX) 100 MG tablet Take 1 tablet (100 mg total) by mouth 2 (two) times daily. 11/19/15   Carole Civil, MD  SYMBICORT 160-4.5 MCG/ACT inhaler INL 2 PUFFS PO BID 11/22/17   [provider]  traMADol (ULTRAM) 50 MG tablet Take 1-2 tablets (50-100 mg total) by mouth 3 (three) times daily as needed. 01/13/18   Mcarthur Rossetti, MD     Allergies:    No Known Allergies   Physical Exam:   Vitals  Blood pressure (!) 138/100, pulse 90, temperature 98.4 F (36.9 C), temperature source Oral,  resp. rate 16, height 5\' 8"  (1.727 m), weight 117.9 kg (260 lb), SpO2 94 %.  1.  General: Appears in no acute distress  2. Psychiatric:  Intact judgement and  insight, awake alert, oriented x 3.  3. Neurologic: No focal neurological deficits, all cranial nerves intact.Strength 5/5 all 4 extremities, sensation intact all 4  extremities, plantars down going.  4. Eyes :  anicteric sclerae, moist conjunctivae with no lid lag. PERRLA.  5. ENMT:  Oropharynx clear with moist mucous membranes and good dentition  6. Neck:  supple, no cervical lymphadenopathy appriciated, No thyromegaly  7. Respiratory : Normal respiratory effort, good air movement bilaterally,clear to  auscultation bilaterally  8. Cardiovascular : RRR, no gallops, rubs or murmurs, no leg edema  9. Gastrointestinal:  Positive bowel sounds, abdomen soft, non-tender to palpation,no hepatosplenomegaly, no rigidity or guarding       10. Skin:  No cyanosis, normal texture and turgor, no rash, lesions or ulcers  11.Musculoskeletal:  Good muscle tone,  joints appear normal , no effusions,  normal range of motion    Data Review:    CBC Recent Labs  Lab 03/08/18 0317  WBC 9.1  HGB 12.5*  HCT 39.5  PLT 247  MCV 85.7  MCH 27.1  MCHC 31.6  RDW 16.8*   ------------------------------------------------------------------------------------------------------------------  Chemistries  Recent Labs  Lab 03/08/18 0317  NA 137  K 3.8  CL 104  CO2 23  GLUCOSE 122*  BUN 35*  CREATININE 2.34*  CALCIUM 9.4   ------------------------------------------------------------------------------------------------------------------  ------------------------------------------------------------------------------------------------------------------ GFR: Estimated Creatinine Clearance: 42.9 mL/min (A) (by C-G formula based on SCr of 2.34 mg/dL (H)). Liver Function Tests: No results for input(s): AST, ALT, ALKPHOS, BILITOT, PROT,  ALBUMIN in the last 168 hours. No results for input(s): LIPASE, AMYLASE in the last 168 hours. No results for input(s): AMMONIA in the last 168 hours. Coagulation Profile: Recent Labs  Lab 03/08/18 0317  INR 1.00   Cardiac Enzymes: No results for input(s): CKTOTAL, CKMB, CKMBINDEX, TROPONINI in the last 168 hours. BNP (last 3 results) No results for input(s): PROBNP in the last 8760 hours. HbA1C: No results for input(s): HGBA1C in the last 72 hours. CBG: No results for input(s): GLUCAP in the last 168 hours. Lipid Profile: No results for input(s): CHOL, HDL, LDLCALC, TRIG, CHOLHDL, LDLDIRECT in the last 72 hours. Thyroid Function Tests: No results for input(s): TSH, T4TOTAL, FREET4, T3FREE, THYROIDAB in the last 72 hours. Anemia Panel: No results for input(s): VITAMINB12, FOLATE, FERRITIN, TIBC, IRON, RETICCTPCT in the last 72 hours.  --------------------------------------------------------------------------------------------------------------- Urine analysis:    Component Value Date/Time   COLORURINE YELLOW 07/06/2013 1024   APPEARANCEUR CLEAR 07/06/2013 1024   LABSPEC >1.030 (H) 07/06/2013 1024   PHURINE 5.5 07/06/2013 1024   GLUCOSEU NEGATIVE 07/06/2013 1024   HGBUR NEGATIVE 07/06/2013 1024   BILIRUBINUR NEGATIVE 07/06/2013 1024   KETONESUR NEGATIVE 07/06/2013 1024   PROTEINUR 100 (A) 07/06/2013 1024   UROBILINOGEN 0.2 07/06/2013 1024   NITRITE NEGATIVE 07/06/2013 1024   LEUKOCYTESUR NEGATIVE 07/06/2013 1024      Imaging Results:    Dg Chest 2 View  Result Date: 03/08/2018 CLINICAL DATA:  Left-sided chest pain and shortness of breath while at work today. History of bronchitis and hypertension. Former smoker. EXAM: CHEST - 2 VIEW COMPARISON:  11/22/2017 FINDINGS: Heart size and pulmonary vascularity are normal. There is central interstitial change with mild peribronchial thickening and mild central bronchiectasis likely indicating chronic bronchitic changes.  Emphysematous changes in the upper lungs. No airspace disease or consolidation. No blunting of costophrenic angles. No pneumothorax. Mediastinal contours appear intact. Calcification of the aorta. Similar appearance to previous study. IMPRESSION: Emphysematous and chronic bronchitic changes in the lungs. No evidence of active pulmonary disease. Electronically Signed   By: Lucienne Capers M.D.   On: 03/08/2018 03:43    My personal  review of EKG: Rhythm NSR   Assessment & Plan:    Active Problems:   Essential hypertension, benign   Chest pain   1. Chest pain-placed under observation, obtain serial troponin every 6 hours x3. 2. Elevated d-dimer-patient has d-dimer elevated 0.69, VQ scan ordered.  Also started on IV heparin.  CTA chest not done due to renal insufficiency. 3. Hypertension-blood pressure stable, continue amlodipine. 4. CKD stage III-patient's creatinine is at baseline 2.34.  He is followed by nephrology as outpatient. 5. History of alcohol abuse-we will start CIWA protocol.  No signs and symptoms of alcohol control this time.    DVT Prophylaxis-   Heparin  AM Labs Ordered, also please review Full Orders  Family Communication: Admission, patients condition and plan of care including tests being ordered have been discussed with the patient and his wife at bedside who indicate understanding and agree with the plan and Code Status.  Code Status:  Full code  Admission status: Inpatient    Time spent in minutes : 60 min   Oswald Hillock M.D on 03/08/2018 at 4:53 AM  Between 7am to 7pm - Pager - [phone]. After 7pm go to [email or web address] - password [facility]  [facility] - Office  [phone]

## 2018-03-09 LAB — HIV ANTIBODY (ROUTINE TESTING W REFLEX): HIV Screen 4th Generation wRfx: NONREACTIVE

## 2018-03-13 ENCOUNTER — Telehealth (INDEPENDENT_AMBULATORY_CARE_PROVIDER_SITE_OTHER): Payer: Self-pay | Admitting: Orthopaedic Surgery

## 2018-03-13 NOTE — Telephone Encounter (Signed)
FYI

## 2018-03-13 NOTE — Telephone Encounter (Signed)
Patient calling to schedule appointment for left hip to discuss surgery in January.  Patient ultimately wants to go out on disability since he has already had right hip done.  Appointment made 4:15 March 15, 2018

## 2018-03-15 ENCOUNTER — Encounter (INDEPENDENT_AMBULATORY_CARE_PROVIDER_SITE_OTHER): Payer: Self-pay | Admitting: Orthopaedic Surgery

## 2018-03-15 ENCOUNTER — Ambulatory Visit (INDEPENDENT_AMBULATORY_CARE_PROVIDER_SITE_OTHER): Payer: Self-pay

## 2018-03-15 ENCOUNTER — Ambulatory Visit (INDEPENDENT_AMBULATORY_CARE_PROVIDER_SITE_OTHER): Payer: BLUE CROSS/BLUE SHIELD | Admitting: Orthopaedic Surgery

## 2018-03-15 DIAGNOSIS — M1612 Unilateral primary osteoarthritis, left hip: Secondary | ICD-10-CM

## 2018-03-15 DIAGNOSIS — M25552 Pain in left hip: Secondary | ICD-10-CM | POA: Diagnosis not present

## 2018-03-15 MED ORDER — TRAMADOL HCL 50 MG PO TABS: 50.0000 mg | ORAL_TABLET | Freq: Three times a day (TID) | ORAL | 0 refills | Status: DC | PRN

## 2018-03-15 NOTE — Progress Notes (Signed)
Office Visit Note   Patient: Mathew Lara           Date of Birth: 12-27-1960           MRN: [id number] Visit Date: 03/15/2018              Requested by: [name], [address], [place] [id number] PCP: [name], MD   Assessment & Plan: Visit Diagnoses:  1. Pain in left hip   2. Unilateral primary osteoarthritis, left hip     Plan: We will continue tramadol to help with his pain control since he cannot take anti-inflammatories in light of his kidney disease.  We will see him back in 4 months but no x-rays are needed.  At that point we will consider setting him up for left hip replacement surgery.  He would likely need medical clearance for the surgery as well but this will depend on how is doing at that visit and if he has any other hospitalizations for any exacerbation of COPD.  I do feel at some point he would likely benefit from disability due to other multiple medical issues combined with his joint disease.  Follow-Up Instructions: Return in about 4 months (around 07/16/2018).   Orders:  Orders Placed This Encounter  Procedures  . XR HIP UNILAT W OR W/O PELVIS 1V LEFT   Meds ordered this encounter  Medications  . traMADol (ULTRAM) 50 MG tablet    Sig: Take 1-2 tablets (50-100 mg total) by mouth 3 (three) times daily as needed.    Dispense:  60 tablet    Refill:  0      Procedures: No procedures performed   Clinical Data: No additional findings.   Subjective: Chief Complaint  Patient presents with  . Left Hip - Pain  The patient is well-known to me.  He is 21 months out from a right total hip arthroplasty.  He has severe pain in his left hip.  He is moderately obese individual with multiple medical issues including hypertension, kidney disease and COPD.  He has known severe left hip disease with a component of avascular necrosis and a posterior arthritis.  He has had now 2 steroid injections in that left hip with the last one being in just  May.  He embolus with a cane and he has severe pain in that hip at this point.  He still would like to wait to proceed with hip replacement surgery until after the first of next year.  He is considering disability as well.  HPI  Review of Systems He currently denies any chest pain or shortness of breath.  Of note he is been hospitalized recently for an exacerbation of his COPD.  Objective: Vital Signs: There were no vitals taken for this visit.  Physical Exam He is alert and oriented x3 and in no acute distress Ortho Exam Examination of his left hip shows severe pain with attempts of internal and external rotation.  His right hip moves fluidly. Specialty Comments:  No specialty comments available.  Imaging: Xr Hip Unilat W Or W/o Pelvis 1v Left  Result Date: 03/15/2018 An AP pelvis and lateral left hip shows a total hip arthroplasty on the right side.  The left side shows severe osteoarthritis of the left hip.  There may be even a component of avascular necrosis.  There is severe flattening of the femoral head and loss of joint space.    PMFS History: Patient Active Problem List   Diagnosis  Date Noted  . Chest pain 03/08/2018  . CKD (chronic kidney disease), stage III ([place]) 03/08/2018  . COPD (chronic obstructive pulmonary disease) ([place]) 03/08/2018  . Avascular necrosis of bone of left hip ([place]) 07/27/2017  . Avascular necrosis of bone of right hip ([place]) 06/04/2016  . Status post total replacement of right hip 06/04/2016  . Hepatitis C 03/21/2015  . History of alcohol abuse 11/08/2013  . Tobacco abuse 11/08/2013  . Essential hypertension, benign 11/07/2013  . Abnormal ECG 11/07/2013   Past Medical History:  Diagnosis Date  . Alcohol abuse   . Anxiety   . Bronchitis   . Chronic hip pain   . Chronic kidney disease (CKD) stage G3b/A3, moderately decreased glomerular filtration rate (GFR) between 30-44 mL/min/1.73 square meter and albuminuria creatinine ratio greater than 300  mg/g ([place]) 01/03/2018  . Hepatitis C   . Hypertension   . Mixed hyperlipidemia   . Renal disorder    states renal dysfunction, "strained kidney"  . Sleep apnea     Family History  Problem Relation Age of Onset  . Cancer Mother     Past Surgical History:  Procedure Laterality Date  . COLONOSCOPY N/A 01/23/2014   SLF:The LEFT COLON IS EXTREMELY redundant/TWO RECTAL POLYPS REMOVED/SMALL VOLUME RECTAL BLEEDING MOST LIKELY DUE TO Small internal hemorrhoids  . ESOPHAGOGASTRODUODENOSCOPY N/A 01/23/2014   WH[facility]:PRFFMBWG ring at the gastroesophageal junction/Medium sized hiatal hernia/NDYSPEPSIA MOST LIKELY DUE TO GERD/MILD Non-erosive gastritis  . HEMORRHOID BANDING  01/23/2014   Procedure: HEMORRHOID BANDING;  Surgeon: Danie Binder, MD;  [facility];  Service: Endoscopy;;  . MOUTH SURGERY    . TOTAL HIP ARTHROPLASTY Right 06/04/2016   Procedure: RIGHT TOTAL HIP ARTHROPLASTY ANTERIOR APPROACH;  Surgeon: Mcarthur Rossetti, MD;  Location: [facility];  Service: Orthopedics;  Laterality: Right;   Social History   Occupational History    Comment: [employer]  Tobacco Use  . Smoking status: Former Smoker    Types: Cigarettes    Last attempt to quit: 05/28/2014    Years since quitting: 3.8  . Smokeless tobacco: Never Used  . Tobacco comment: quit in November 2015 after smoking x 20 yrs.  Substance and Sexual Activity  . Alcohol use: No    Alcohol/week: 0.0 oz  . Drug use: No  . Sexual activity: Not on file

## 2018-03-26 ENCOUNTER — Encounter (HOSPITAL_COMMUNITY): Payer: Self-pay | Admitting: *Deleted

## 2018-03-26 ENCOUNTER — Observation Stay (HOSPITAL_COMMUNITY)
Admission: EM | Admit: 2018-03-26 | Discharge: 2018-03-27 | Disposition: A | Discharge status: Home / Self Care | Payer: BLUE CROSS/BLUE SHIELD | Attending: Internal Medicine | Admitting: Internal Medicine

## 2018-03-26 ENCOUNTER — Emergency Department (HOSPITAL_COMMUNITY): Payer: BLUE CROSS/BLUE SHIELD

## 2018-03-26 ENCOUNTER — Other Ambulatory Visit: Payer: Self-pay

## 2018-03-26 DIAGNOSIS — I129 Hypertensive chronic kidney disease with stage 1 through stage 4 chronic kidney disease, or unspecified chronic kidney disease: Secondary | ICD-10-CM | POA: Diagnosis not present

## 2018-03-26 DIAGNOSIS — I10 Essential (primary) hypertension: Secondary | ICD-10-CM | POA: Diagnosis not present

## 2018-03-26 DIAGNOSIS — N179 Acute kidney failure, unspecified: Secondary | ICD-10-CM | POA: Diagnosis present

## 2018-03-26 DIAGNOSIS — R21 Rash and other nonspecific skin eruption: Secondary | ICD-10-CM | POA: Diagnosis present

## 2018-03-26 DIAGNOSIS — N170 Acute kidney failure with tubular necrosis: Principal | ICD-10-CM | POA: Insufficient documentation

## 2018-03-26 DIAGNOSIS — Z87891 Personal history of nicotine dependence: Secondary | ICD-10-CM | POA: Diagnosis not present

## 2018-03-26 DIAGNOSIS — N183 Chronic kidney disease, stage 3 unspecified: Secondary | ICD-10-CM | POA: Diagnosis present

## 2018-03-26 DIAGNOSIS — R05 Cough: Secondary | ICD-10-CM | POA: Diagnosis not present

## 2018-03-26 DIAGNOSIS — Z96641 Presence of right artificial hip joint: Secondary | ICD-10-CM | POA: Insufficient documentation

## 2018-03-26 DIAGNOSIS — R0602 Shortness of breath: Secondary | ICD-10-CM | POA: Diagnosis not present

## 2018-03-26 DIAGNOSIS — J449 Chronic obstructive pulmonary disease, unspecified: Secondary | ICD-10-CM | POA: Diagnosis present

## 2018-03-26 LAB — URINALYSIS, ROUTINE W REFLEX MICROSCOPIC
BACTERIA UA: NONE SEEN
BILIRUBIN URINE: NEGATIVE
Cellular Cast, UA: 14
GLUCOSE, UA: NEGATIVE mg/dL
HGB URINE DIPSTICK: NEGATIVE
KETONES UR: NEGATIVE mg/dL
LEUKOCYTES UA: NEGATIVE
NITRITE: NEGATIVE
PH: 5 (ref 5.0–8.0)
Protein, ur: 100 mg/dL — AB
SPECIFIC GRAVITY, URINE: 1.021 (ref 1.005–1.030)

## 2018-03-26 LAB — COMPREHENSIVE METABOLIC PANEL
ALT: 20 U/L (ref 0–44)
AST: 21 U/L (ref 15–41)
Albumin: 3.9 g/dL (ref 3.5–5.0)
Alkaline Phosphatase: 92 U/L (ref 38–126)
Anion gap: 10 (ref 5–15)
BILIRUBIN TOTAL: 0.3 mg/dL (ref 0.3–1.2)
BUN: 37 mg/dL — AB (ref 6–20)
CO2: 21 mmol/L — ABNORMAL LOW (ref 22–32)
Calcium: 9.6 mg/dL (ref 8.9–10.3)
Chloride: 107 mmol/L (ref 98–111)
Creatinine, Ser: 3.04 mg/dL — ABNORMAL HIGH (ref 0.61–1.24)
GFR calc non Af Amer: 21 mL/min — ABNORMAL LOW (ref >60)
GFR, EST AFRICAN AMERICAN: 24 mL/min — AB (ref >60)
Glucose, Bld: 94 mg/dL (ref 70–99)
POTASSIUM: 4.1 mmol/L (ref 3.5–5.1)
Sodium: 138 mmol/L (ref 135–145)
Total Protein: 9.6 g/dL — ABNORMAL HIGH (ref 6.5–8.1)

## 2018-03-26 LAB — CBC WITH DIFFERENTIAL/PLATELET
BASOS ABS: 0 10*3/uL (ref 0.0–0.1)
Basophils Relative: 0 %
Eosinophils Absolute: 0.3 10*3/uL (ref 0.0–0.7)
Eosinophils Relative: 3 %
HEMATOCRIT: 38.2 % — AB (ref 39.0–52.0)
Hemoglobin: 12.1 g/dL — ABNORMAL LOW (ref 13.0–17.0)
LYMPHS ABS: 1.3 10*3/uL (ref 0.7–4.0)
LYMPHS PCT: 13 %
MCH: 27.3 pg (ref 26.0–34.0)
MCHC: 31.7 g/dL (ref 30.0–36.0)
MCV: 86 fL (ref 78.0–100.0)
MONO ABS: 0.6 10*3/uL (ref 0.1–1.0)
MONOS PCT: 6 %
NEUTROS ABS: 8.2 10*3/uL — AB (ref 1.7–7.7)
Neutrophils Relative %: 78 %
Platelets: 273 10*3/uL (ref 150–400)
RBC: 4.44 MIL/uL (ref 4.22–5.81)
RDW: 16.9 % — AB (ref 11.5–15.5)
WBC: 10.5 10*3/uL (ref 4.0–10.5)

## 2018-03-26 LAB — DIC (DISSEMINATED INTRAVASCULAR COAGULATION) PANEL
D DIMER QUANT: 3.41 ug{FEU}/mL — AB (ref 0.00–0.50)
PLATELETS: 246 10*3/uL (ref 150–400)
SMEAR REVIEW: NONE SEEN

## 2018-03-26 LAB — LACTATE DEHYDROGENASE: LDH: 179 U/L (ref 98–192)

## 2018-03-26 LAB — HEPATIC FUNCTION PANEL
ALK PHOS: 90 U/L (ref 38–126)
ALT: 18 U/L (ref 0–44)
AST: 21 U/L (ref 15–41)
Albumin: 3.8 g/dL (ref 3.5–5.0)
Bilirubin, Direct: <0.1 mg/dL (ref 0.0–0.2)
TOTAL PROTEIN: 9.3 g/dL — AB (ref 6.5–8.1)
Total Bilirubin: 0.4 mg/dL (ref 0.3–1.2)

## 2018-03-26 LAB — SEDIMENTATION RATE: Sed Rate: 18 mm/hr — ABNORMAL HIGH (ref 0–16)

## 2018-03-26 LAB — DIC (DISSEMINATED INTRAVASCULAR COAGULATION)PANEL
Fibrinogen: 715 mg/dL — ABNORMAL HIGH (ref 210–475)
INR: 1.03
Prothrombin Time: 13.4 seconds (ref 11.4–15.2)
aPTT: 34 seconds (ref 24–36)

## 2018-03-26 MED ORDER — ASPIRIN 81 MG PO CHEW
81.0000 mg | CHEWABLE_TABLET | Freq: Two times a day (BID) | ORAL | Status: DC
  Administered 2018-03-26 – 2018-03-27 (×3): 81 mg via ORAL
  Filled 2018-03-26 (×3): qty 1

## 2018-03-26 MED ORDER — DIPHENHYDRAMINE HCL 25 MG PO CAPS
25.0000 mg | ORAL_CAPSULE | Freq: Four times a day (QID) | ORAL | Status: DC
  Administered 2018-03-26 – 2018-03-27 (×5): 25 mg via ORAL
  Filled 2018-03-26 (×6): qty 1

## 2018-03-26 MED ORDER — METHOCARBAMOL 500 MG PO TABS
500.0000 mg | ORAL_TABLET | Freq: Four times a day (QID) | ORAL | Status: DC | PRN
Start: 2018-03-26 — End: 2018-03-27

## 2018-03-26 MED ORDER — ONDANSETRON HCL 4 MG/2ML IJ SOLN: 4.0000 mg | Freq: Four times a day (QID) | INTRAMUSCULAR | Status: DC | PRN

## 2018-03-26 MED ORDER — ALBUTEROL SULFATE (2.5 MG/3ML) 0.083% IN NEBU
2.5000 mg | INHALATION_SOLUTION | RESPIRATORY_TRACT | Status: DC | PRN
  Administered 2018-03-26: 2.5 mg via RESPIRATORY_TRACT
  Filled 2018-03-26: qty 3

## 2018-03-26 MED ORDER — ACETAMINOPHEN 650 MG RE SUPP: 650.0000 mg | Freq: Four times a day (QID) | RECTAL | Status: DC | PRN

## 2018-03-26 MED ORDER — ACETAMINOPHEN 325 MG PO TABS: 650.0000 mg | ORAL_TABLET | Freq: Four times a day (QID) | ORAL | Status: DC | PRN

## 2018-03-26 MED ORDER — ORPHENADRINE CITRATE ER 100 MG PO TB12
100.0000 mg | ORAL_TABLET | Freq: Two times a day (BID) | ORAL | Status: DC
  Filled 2018-03-26 (×3): qty 1

## 2018-03-26 MED ORDER — ONDANSETRON HCL 4 MG PO TABS: 4.0000 mg | ORAL_TABLET | Freq: Four times a day (QID) | ORAL | Status: DC | PRN

## 2018-03-26 MED ORDER — GABAPENTIN 100 MG PO CAPS
100.0000 mg | ORAL_CAPSULE | Freq: Three times a day (TID) | ORAL | Status: DC
  Administered 2018-03-26 – 2018-03-27 (×4): 100 mg via ORAL
  Filled 2018-03-26 (×4): qty 1

## 2018-03-26 MED ORDER — FAMOTIDINE 20 MG PO TABS: 20.0000 mg | ORAL_TABLET | Freq: Two times a day (BID) | ORAL | Status: DC

## 2018-03-26 MED ORDER — AMLODIPINE BESYLATE 5 MG PO TABS
10.0000 mg | ORAL_TABLET | Freq: Every day | ORAL | Status: DC
  Administered 2018-03-26 – 2018-03-27 (×2): 10 mg via ORAL
  Filled 2018-03-26 (×2): qty 2

## 2018-03-26 MED ORDER — SODIUM CHLORIDE 0.9 % IV SOLN
INTRAVENOUS | Status: DC
  Administered 2018-03-26 – 2018-03-27 (×3): via INTRAVENOUS

## 2018-03-26 MED ORDER — LACTATED RINGERS IV BOLUS
1000.0000 mL | Freq: Once | INTRAVENOUS | Status: AC
  Administered 2018-03-26: 1000 mL via INTRAVENOUS

## 2018-03-26 MED ORDER — HYDROCODONE-ACETAMINOPHEN 5-325 MG PO TABS: 1.0000 | ORAL_TABLET | Freq: Four times a day (QID) | ORAL | Status: DC | PRN

## 2018-03-26 MED ORDER — FAMOTIDINE 20 MG PO TABS
20.0000 mg | ORAL_TABLET | Freq: Every day | ORAL | Status: DC
  Administered 2018-03-27: 20 mg via ORAL
  Filled 2018-03-26: qty 1

## 2018-03-26 MED ORDER — MOMETASONE FURO-FORMOTEROL FUM 200-5 MCG/ACT IN AERO
2.0000 | INHALATION_SPRAY | Freq: Two times a day (BID) | RESPIRATORY_TRACT | Status: DC
  Administered 2018-03-26 – 2018-03-27 (×2): 2 via RESPIRATORY_TRACT
  Filled 2018-03-26: qty 8.8

## 2018-03-26 NOTE — H&P (Signed)
History and Physical    Mathew Lara HBZ:[id number] DOB: March 10, 1961 DOA: 03/26/2018  PCP: [name], MD  Patient coming from: Home  I have personally briefly reviewed patient's old medical records in [place]  Chief Complaint: Rash  HPI: Mathew Lara is a 57 y.o. male with medical history significant of chronic kidney disease stage III, left hip osteoarthritis, hypertension, COPD, within his usual state of health when he was at work last night.  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  He was wearing protective clothing.  He suddenly noticed a burning sensation in his feet bilaterally.  When he removed clothing, he noted a rash developing on his lower extremities bilaterally.  He finished his shift and came to the ER for evaluation.  He has not had any fever, cough, shortness of breath.  He was recently discharged from the hospital on 7/3 after being treated for a COPD exacerbation which quickly resolved.  He was not sent home on any antibiotics at that time.  His rash is nontender, nonpruritic.  He does not report noticing any tick bites recently.  He is unaware of using any new soaps/detergents.  He has been taking tramadol and ibuprofen recently for his head pain.  ED Course: Vitals are noted to be stable in the emergency room.  He was noted to have an elevation and baseline creatinine from 1.7 on his last discharge to 3.04 today.  Patient has been referred for admission.  Review of Systems: As per HPI otherwise 10 point review of systems negative.    Past Medical History:  Diagnosis Date  . Alcohol abuse   . Anxiety   . Bronchitis   . Chronic hip pain   . Chronic kidney disease (CKD) stage G3b/A3, moderately decreased glomerular filtration rate (GFR) between 30-44 mL/min/1.73 square meter and albuminuria creatinine ratio greater than 300 mg/g ([place]) 01/03/2018  . Hepatitis C   . Hypertension   . Mixed hyperlipidemia   . Renal disorder    states renal dysfunction, "strained kidney"  . Sleep apnea     Past Surgical History:  Procedure Laterality Date  . COLONOSCOPY N/A 01/23/2014   SLF:The LEFT COLON IS EXTREMELY redundant/TWO RECTAL POLYPS REMOVED/SMALL VOLUME RECTAL BLEEDING MOST LIKELY DUE TO Small internal hemorrhoids  . ESOPHAGOGASTRODUODENOSCOPY N/A 01/23/2014   BOF:BPZWCHEN ring at the gastroesophageal junction/Medium sized hiatal hernia/NDYSPEPSIA MOST LIKELY DUE TO GERD/MILD Non-erosive gastritis  . HEMORRHOID BANDING  01/23/2014   Procedure: HEMORRHOID BANDING;  Surgeon: Danie Binder, MD;  [facility];  Service: Endoscopy;;  . MOUTH SURGERY    . TOTAL HIP ARTHROPLASTY Right 06/04/2016   Procedure: RIGHT TOTAL HIP ARTHROPLASTY ANTERIOR APPROACH;  Surgeon: Mcarthur Rossetti, MD;  Location: [facility];  Service: Orthopedics;  Laterality: Right;     reports that he quit smoking about 3 years ago. His smoking use included cigarettes. He has never used smokeless tobacco. He reports that he does not drink alcohol or use drugs.  Not on File  Family History  Problem Relation Age of Onset  . Cancer Mother     Prior to Admission medications   Medication Sig Start Date End Date Taking? Authorizing Provider  acetaminophen (TYLENOL) 650 MG CR tablet Take 1,300 mg by mouth every 8 (eight) hours as needed for pain.   Yes [provider]  albuterol (PROVENTIL HFA;VENTOLIN HFA) 108 (90 Base) MCG/ACT inhaler Inhale 1-2 puffs into the lungs every 6 (six) hours as needed for wheezing  or shortness of breath.   Yes [provider]  amLODipine (NORVASC) 10 MG tablet Take 10 mg by mouth daily.   Yes [provider]  aspirin 81 MG chewable tablet Chew 1 tablet (81 mg total) by mouth 2 (two) times daily. 06/06/16  Yes Mcarthur Rossetti, MD  diphenhydrAMINE (BENADRYL) 25 MG tablet Take 1 tablet (25 mg total) by mouth every 6 (six) hours. 01/05/17  Yes Lily Kocher, PA-C  famotidine (PEPCID) 20  MG tablet Take 1 tablet (20 mg total) by mouth 2 (two) times daily. 01/05/17  Yes Lily Kocher, PA-C  HYDROcodone-acetaminophen (NORCO/VICODIN) 5-325 MG tablet Take 1 tablet by mouth every 6 (six) hours as needed for moderate pain. 07/21/16  Yes Mcarthur Rossetti, MD  SYMBICORT 160-4.5 MCG/ACT inhaler Inhale two puffs twice daily 11/22/17  Yes [provider]  traMADol (ULTRAM) 50 MG tablet Take 1-2 tablets (50-100 mg total) by mouth 3 (three) times daily as needed. 03/15/18  Yes Mcarthur Rossetti, MD    Physical Exam: Vitals:   03/26/18 0930 03/26/18 1014 03/26/18 1414 03/26/18 1511  BP: 106/80 (!) 131/92  (!) 138/97  Pulse: 92 83  86  Resp: 18 20  18   Temp: 98.2 F (36.8 C) 98.1 F (36.7 C)  98 F (36.7 C)  TempSrc: Oral Oral  Oral  SpO2: 94% 99% 90% 98%  Weight:  114 kg (251 lb 5.2 oz)    Height:  5\' 8"  (1.727 m)      Constitutional: NAD, calm, comfortable Vitals:   03/26/18 0930 03/26/18 1014 03/26/18 1414 03/26/18 1511  BP: 106/80 (!) 131/92  (!) 138/97  Pulse: 92 83  86  Resp: 18 20  18   Temp: 98.2 F (36.8 C) 98.1 F (36.7 C)  98 F (36.7 C)  TempSrc: Oral Oral  Oral  SpO2: 94% 99% 90% 98%  Weight:  114 kg (251 lb 5.2 oz)    Height:  5\' 8"  (1.727 m)     Eyes: PERRL, lids and conjunctivae normal ENMT: Mucous membranes are moist. Posterior pharynx clear of any exudate or lesions.Normal dentition.  Neck: normal, supple, no masses, no thyromegaly Respiratory: clear to auscultation bilaterally, no wheezing, no crackles. Normal respiratory effort. No accessory muscle use.  Cardiovascular: Regular rate and rhythm, no murmurs / rubs / gallops. No extremity edema. 2+ pedal pulses. No carotid bruits.  Abdomen: no tenderness, no masses palpated. No hepatosplenomegaly. Bowel sounds positive.  Musculoskeletal: no clubbing / cyanosis. No joint deformity upper and lower extremities. Good ROM, no contractures. Normal muscle tone.  Skin: Petechial rash noted on  lower extremities and arms as pictured below Neurologic: CN 2-12 grossly intact. Sensation intact, DTR normal. Strength 5/5 in all 4.  Psychiatric: Normal judgment and insight. Alert and oriented x 3. Normal mood.        Labs on Admission: I have personally reviewed following labs and imaging studies  CBC: Recent Labs  Lab 03/26/18 0746  WBC 10.5  NEUTROABS 8.2*  HGB 12.1*  HCT 38.2*  MCV 86.0  PLT 246  491   Basic Metabolic Panel: Recent Labs  Lab 03/26/18 0746  NA 138  K 4.1  CL 107  CO2 21*  GLUCOSE 94  BUN 37*  CREATININE 3.04*  CALCIUM 9.6   GFR: Estimated Creatinine Clearance: 32.4 mL/min (A) (by C-G formula based on SCr of 3.04 mg/dL (H)). Liver Function Tests: Recent Labs  Lab 03/26/18 0746 03/26/18 1217  AST 21 21  ALT 20 18  ALKPHOS 92 90  BILITOT 0.3 0.4  PROT 9.6* 9.3*  ALBUMIN 3.9 3.8   No results for input(s): LIPASE, AMYLASE in the last 168 hours. No results for input(s): AMMONIA in the last 168 hours. Coagulation Profile: Recent Labs  Lab 03/26/18 0746  INR 1.03   Cardiac Enzymes: No results for input(s): CKTOTAL, CKMB, CKMBINDEX, TROPONINI in the last 168 hours. BNP (last 3 results) No results for input(s): PROBNP in the last 8760 hours. HbA1C: No results for input(s): HGBA1C in the last 72 hours. CBG: No results for input(s): GLUCAP in the last 168 hours. Lipid Profile: No results for input(s): CHOL, HDL, LDLCALC, TRIG, CHOLHDL, LDLDIRECT in the last 72 hours. Thyroid Function Tests: No results for input(s): TSH, T4TOTAL, FREET4, T3FREE, THYROIDAB in the last 72 hours. Anemia Panel: No results for input(s): VITAMINB12, FOLATE, FERRITIN, TIBC, IRON, RETICCTPCT in the last 72 hours. Urine analysis:    Component Value Date/Time   COLORURINE YELLOW 03/26/2018 0721   APPEARANCEUR CLOUDY (A) 03/26/2018 0721   LABSPEC 1.021 03/26/2018 0721   PHURINE 5.0 03/26/2018 0721   GLUCOSEU NEGATIVE 03/26/2018 0721   HGBUR NEGATIVE  03/26/2018 0721   BILIRUBINUR NEGATIVE 03/26/2018 0721   KETONESUR NEGATIVE 03/26/2018 0721   PROTEINUR 100 (A) 03/26/2018 0721   UROBILINOGEN 0.2 07/06/2013 1024   NITRITE NEGATIVE 03/26/2018 0721   LEUKOCYTESUR NEGATIVE 03/26/2018 0721    Radiological Exams on Admission: Dg Chest 2 View  Result Date: 03/26/2018 CLINICAL DATA:  Cough and shortness of breath. Rash. History of COPD. EXAM: CHEST - 2 VIEW COMPARISON:  03/08/2018 FINDINGS: The cardiomediastinal silhouette is unchanged and within normal limits. Chronic bronchitic changes are similar to the prior study. No confluent airspace opacity, edema, pleural effusion, or pneumothorax is identified. No acute osseous abnormality is seen. IMPRESSION: No active cardiopulmonary disease. Electronically Signed   By: Logan Bores M.D.   On: 03/26/2018 08:49   Assessment/Plan Active Problems:   Essential hypertension, benign   CKD (chronic kidney disease), stage III (HCC)   COPD (chronic obstructive pulmonary disease) (HCC)   Acute kidney injury (HCC)   Rash     1. Rash.  Patient found to have acute onset of maculopapular rash bilaterally on lower extremities as well as arms.  Appears to have spared his trunk.  Unclear etiology at this point.  Could possibly related to medication such as ibuprofen that he is been taking recently.  Other autoimmune work-up is also been sent.  If he does not significantly improve, may need to consider outpatient dermatology referral. 2. Acute kidney injury on chronic kidney disease stage III.  Unclear if this is related to rash.  He has been advised not to continue any further NSAIDs.  Nephrology consultation.  Further work-up in process.  Repeat labs in a.m.  If renal function fails to improve, may need to consider renal biopsy. 3. COPD.  Appears stable at this time.  No shortness of breath or wheezing.  Continue bronchodilators as needed. 4. Hypertension.  Blood pressures currently stable.  Continue home  medications.  DVT prophylaxis: SCDs  Code Status: full code  Family Communication: discussed with wife over the phone  Disposition Plan: discharge home once improved  Consults called: nephrology  Admission status: observation, medsurg   Kathie Dike MD [facility] Pager [phone]  If 7PM-7AM, please contact night-coverage [email or web address] Password [facility]  03/26/2018, 7:08 PM

## 2018-03-26 NOTE — ED Triage Notes (Signed)
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

## 2018-03-26 NOTE — Consult Note (Signed)
Mathew Lara MRN: [id number] DOB/AGE: 1961-08-31 57 y.o. Primary Care Physician:Golding, Jenny Reichmann, MD Admit date: 03/26/2018 Chief Complaint:  Chief Complaint  Patient presents with  . Rash   HPI: Pt is 57 year old [place] American male with past medical hx of HTN who came to ER with c/o rash.  HPI dates back to yesterday when pt noticed sudden onset of rash on his legs, bilateral in location, progressive so pt came to ER. Upon evaluation in ER pt was found to have AKI and nephrology was consulted. Pt was seen on 3rd floor. Pt had extended family present in the room. NO c.o hematuria NO c/o fever/cough/chills NO c/o hematemesis NO c/o sore throat NO c/o any insect bite NO c/o any antibiotic usage. Pt gave hx of using advil upto three times day because of his hip pain.    Past Medical History:  Diagnosis Date  . Alcohol abuse   . Anxiety   . Bronchitis   . Chronic hip pain   . Chronic kidney disease (CKD) stage G3b/A3, moderately decreased glomerular filtration rate (GFR) between 30-44 mL/min/1.73 square meter and albuminuria creatinine ratio greater than 300 mg/g ([place]) 01/03/2018  . Hepatitis C   . Hypertension   . Mixed hyperlipidemia   . Renal disorder    states renal dysfunction, "strained kidney"  . Sleep apnea         Family History  Problem Relation Age of Onset  . Cancer Mother     Social History:  reports that he quit smoking about 3 years ago. His smoking use included cigarettes. He has never used smokeless tobacco. He reports that he does not drink alcohol or use drugs.   Allergies: Not on File  Medications Prior to Admission  Medication Sig Dispense Refill  . acetaminophen (TYLENOL) 650 MG CR tablet Take 1,300 mg by mouth every 8 (eight) hours as needed for pain.    Marland Kitchen albuterol (PROVENTIL HFA;VENTOLIN HFA) 108 (90 Base) MCG/ACT inhaler Inhale 1-2 puffs into the lungs every 6 (six) hours as needed for wheezing or shortness of breath.    Marland Kitchen amLODipine  (NORVASC) 10 MG tablet Take 10 mg by mouth daily.    Marland Kitchen aspirin 81 MG chewable tablet Chew 1 tablet (81 mg total) by mouth 2 (two) times daily. 30 tablet 0  . diphenhydrAMINE (BENADRYL) 25 MG tablet Take 1 tablet (25 mg total) by mouth every 6 (six) hours. 20 tablet 0  . famotidine (PEPCID) 20 MG tablet Take 1 tablet (20 mg total) by mouth 2 (two) times daily. 14 tablet 0  . HYDROcodone-acetaminophen (NORCO/VICODIN) 5-325 MG tablet Take 1 tablet by mouth every 6 (six) hours as needed for moderate pain. 60 tablet 0  . SYMBICORT 160-4.5 MCG/ACT inhaler Inhale two puffs twice daily  11  . traMADol (ULTRAM) 50 MG tablet Take 1-2 tablets (50-100 mg total) by mouth 3 (three) times daily as needed. 60 tablet 0       ION:GEXBM from the symptoms mentioned above,there are no other symptoms referable to all systems reviewed.  Marland Kitchen amLODipine  10 mg Oral Daily  . aspirin  81 mg Oral BID  . diphenhydrAMINE  25 mg Oral Q6H  . [START ON 03/27/2018] famotidine  20 mg Oral Daily  . gabapentin  100 mg Oral TID  . mometasone-formoterol  2 puff Inhalation BID  . orphenadrine  100 mg Oral BID     Physical Exam: Vital signs in last 24 hours: Temp:  [97.9 F (36.6 C)-98.2 F (  36.8 C)] 98.1 F (36.7 C) (07/21 1014) Pulse Rate:  [83-97] 83 (07/21 1014) Resp:  [16-20] 20 (07/21 1014) BP: (106-135)/(79-93) 131/92 (07/21 1014) SpO2:  [94 %-99 %] 99 % (07/21 1014) Weight:  [251 lb 5.2 oz (114 kg)-265 lb (120.2 kg)] 251 lb 5.2 oz (114 kg) (07/21 1014) Weight change:     Intake/Output from previous day: No intake/output data recorded. No intake/output data recorded.   Physical Exam: General- pt is awake,alert, oriented to time place and person Resp- No acute REsp distress, CTA B/L NO Rhonchi CVS- S1S2 regular in rate and rhythm GIT- BS+, soft, NT, ND EXT- NO LE Edema, Cyanosis CNS- CN 2-12 grossly intact. Moving all 4 extremities Psych- normal mood and affect Skin papular rash bilaterally on lower  ext    Lab Results: CBC Recent Labs    03/26/18 0746  WBC 10.5  HGB 12.1*  HCT 38.2*  PLT 246  273    BMET Recent Labs    03/26/18 0746  NA 138  K 4.1  CL 107  CO2 21*  GLUCOSE 94  BUN 37*  CREATININE 3.04*  CALCIUM 9.6    Creat trend 2019 1.7=>3.0 2017 1.3--2.5 2015  2.2 2014  1.5   Lab Results  Component Value Date   CALCIUM 9.6 03/26/2018      Impression: 1)Renal  AKI secondary to ATN/AIN vs vasculitis??               AKI on CKD               CKD stage 3 .               CKD since  2014               CKD secondary to HTN                Progression of CKD marked with multiple aki                Proteinura will check                      Data in favor of AIN                 Hx of NSAIDS +rise in creat + rash on ext                I had  An extensive discussion with pt about need /benefit/ risks of renal biopsy               I also discussed the treatment options. I printed the side  effects of steroids and gave to pt.               Pt at this time is reluctant to undergo procedure or to take steroids                         2)HTN  Medication- On Calcium Channel Blockers    3)Anemia HGb at goal (9--11)   4)CKD Mineral-Bone Disorder PTH not avail Secondary Hyperparathyroidism w/u pending. Phosphorus will check  5)ID- [facility] Spotted Fever? Primary MD following  6)Electrolytes Normokalemic NOrmonatremic   7)Acid base Co2 at goal     Plan:  Agree with autoimmune work up sent- c3,c5,ch50, Ana, Anca, ESR and RPR. Will add anti GBM ab , hep profile Will check fena Will ask for renal u.s Will follow  bmet  I had  An extensive discussion with pt about need /benefit/ risks of renal biopsy. I also discussed the treatment options. I printed the side  effects of steroids and gave to pt. Pt at this time is reluctant to undergo procedure or to take steroids After extensive discussion plan is to wait till am, if kidney  worsens , pt will let us know if he wishes to have steroids/undero biopsy. If ESR results come back to more than 100-will go back to pt and discuss again.     [place] S 03/26/2018, 12:38 PM

## 2018-03-26 NOTE — ED Provider Notes (Signed)
Emergency Department Provider Note   I have reviewed the triage vital signs and the nursing notes.   HISTORY  Chief Complaint Rash   HPI Mathew Lara is a 57 y.o. male with multiple medical problems to include chronic kidney disease, hypertension, hepatitis, alcohol abuse, hypertension and hyperlipidemia the presents to the emergency department today secondary to a rash.  Patient states that this came on in the next 1218 hrs.  He felt that his legs were on fire while he was at work.  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  States that he wears like a rain suit while doing it and he had that on today even when this started.  When he felt the fire in his legs he went to the locker room and took off his rinse it and pulled up his pants and noticed the rash.  He finished his shift and came here for further evaluation.  Review of systems patient does not endorse any persistent cough, fever, night sweats, urinary symptoms, nausea, vomiting, back pain, chest pain, abdominal pain, headaches, loss of vision, neurologic changes.  States he never had a rash like this before.  Review of records does look like he started Ultram July 10 but no other recent medication changes.  He was recently admitted for chest pain found to have a COPD but that was multiple weeks ago.   No other associated or modifying symptoms.    Past Medical History:  Diagnosis Date  . Alcohol abuse   . Anxiety   . Bronchitis   . Chronic hip pain   . Chronic kidney disease (CKD) stage G3b/A3, moderately decreased glomerular filtration rate (GFR) between 30-44 mL/min/1.73 square meter and albuminuria creatinine ratio greater than 300 mg/g ([place]) 01/03/2018  . Hepatitis C   . Hypertension   . Mixed hyperlipidemia   . Renal disorder    states renal dysfunction, "strained kidney"  . Sleep apnea     Patient Active Problem List   Diagnosis Date Noted  . Chest pain  03/08/2018  . CKD (chronic kidney disease), stage III ([place]) 03/08/2018  . COPD (chronic obstructive pulmonary disease) ([place]) 03/08/2018  . Avascular necrosis of bone of left hip ([place]) 07/27/2017  . Avascular necrosis of bone of right hip ([place]) 06/04/2016  . Status post total replacement of right hip 06/04/2016  . Hepatitis C 03/21/2015  . History of alcohol abuse 11/08/2013  . Tobacco abuse 11/08/2013  . Essential hypertension, benign 11/07/2013  . Abnormal ECG 11/07/2013    Past Surgical History:  Procedure Laterality Date  . COLONOSCOPY N/A 01/23/2014   SLF:The LEFT COLON IS EXTREMELY redundant/TWO RECTAL POLYPS REMOVED/SMALL VOLUME RECTAL BLEEDING MOST LIKELY DUE TO Small internal hemorrhoids  . ESOPHAGOGASTRODUODENOSCOPY N/A 01/23/2014   JKD:TOIZTIWP ring at the gastroesophageal junction/Medium sized hiatal hernia/NDYSPEPSIA MOST LIKELY DUE TO GERD/MILD Non-erosive gastritis  . HEMORRHOID BANDING  01/23/2014   Procedure: HEMORRHOID BANDING;  Surgeon: Danie Binder, MD;  Location[facility];  Service: Endoscopy;;  . MOUTH SURGERY    . TOTAL HIP ARTHROPLASTY Right 06/04/2016   Procedure: RIGHT TOTAL HIP ARTHROPLASTY ANTERIOR APPROACH;  Surgeon: Mcarthur Rossetti, MD;  Location: [facility];  Service: Orthopedics;  Laterality: Right;    Current Outpatient Rx  . Order #: [id number] Class: Historical Med  . Order #: [id number] Class: Historical Med  . Order #: [id number] Class: Historical Med  . Order #: [id number] Class: Print  . Order #:  [id number] Class: Historical Med  . Order #: [id number] Class: Print  . Order #: [id number] Class: Print  . Order #: [id number] Class: Normal  . Order #: [id number] Class: Print  . Order #: [id number] Class: Print  . Order #: [id number] Class: Normal  . Order #: [id number] Class: Historical Med  . Order #: [id number] Class: Normal    Allergies Patient has no known allergies.  Family History  Problem Relation Age of Onset  . Cancer Mother     Social  History Social History   Tobacco Use  . Smoking status: Former Smoker    Types: Cigarettes    Last attempt to quit: 05/28/2014    Years since quitting: 3.8  . Smokeless tobacco: Never Used  . Tobacco comment: quit in November 2015 after smoking x 20 yrs.  Substance Use Topics  . Alcohol use: No    Alcohol/week: 0.0 oz  . Drug use: No    Review of Systems  All other systems negative except as documented in the HPI. All pertinent positives and negatives as reviewed in the HPI. ____________________________________________   PHYSICAL EXAM:  VITAL SIGNS: ED Triage Vitals  Enc Vitals Group     BP 03/26/18 0628 135/79     Pulse Rate 03/26/18 0628 96     Resp 03/26/18 0628 20     Temp 03/26/18 0628 97.9 F (36.6 C)     Temp Source 03/26/18 0628 Oral     SpO2 03/26/18 0628 96 %     Weight 03/26/18 0627 265 lb (120.2 kg)     Height 03/26/18 0627 5\' 8"  (1.727 m)     Head Circumference --      Peak Flow --      Pain Score 03/26/18 0626 0     Pain Loc --      Pain Edu? --      Excl. in [place]? --     Constitutional: Alert and oriented. Well appearing and in no acute distress. Eyes: Conjunctivae are normal. PERRL. EOMI. Head: Atraumatic. Nose: No congestion/rhinnorhea. Mouth/Throat: Mucous membranes are moist.  Oropharynx non-erythematous. Neck: No stridor.  No meningeal signs.   Cardiovascular: Normal rate, regular rhythm. Good peripheral circulation. Grossly normal heart sounds.   Respiratory: Normal respiratory effort.  No retractions. Lungs CTAB. Gastrointestinal: Soft and nontender. No distention.  Musculoskeletal: No lower extremity tenderness nor edema. No gross deformities of extremities. Neurologic:  Normal speech and language. No gross focal neurologic deficits are appreciated.  Skin:  Skin is warm, dry and intact. Petechial rash involving both legs, abdomen below the nipples and less concentrated lesions on distal arms and lower back. None on palms or soles. None in  mouth. Has small blister on lip but no obvious petechia or purpura.   ____________________________________________   LABS (all labs ordered are listed, but only abnormal results are displayed)  Labs Reviewed  CBC WITH DIFFERENTIAL/PLATELET  COMPREHENSIVE METABOLIC PANEL  URINALYSIS, ROUTINE W REFLEX MICROSCOPIC  DIC (DISSEMINATED INTRAVASCULAR COAGULATION) PANEL   ____________________________________________   INITIAL IMPRESSION / ASSESSMENT AND PLAN / ED COURSE  Patient does have a history of hepatitis and alcohol abuse both could be related to this.  Could also be ITP versus TTP or some type of blood cancer however that still a bit less likely since he was recently admitted to the hospital.  We will evaluate for the same.  Patient is nontoxic-appearing.  Afebrile.  No headache or neck pain to suggest meningitis.  Low suspicion for infectious cause for symptoms.  Found to have an acute  rise in his creatinine.  Discussed with hospitalist who will admit acute on chronic kidney disease but also further evaluation of the rash.  Suspicion for meningitis at this time.  Possible autoimmune.      Pertinent labs & imaging results that were available during my care of the patient were reviewed by me and considered in my medical decision making (see chart for details).  ____________________________________________  FINAL CLINICAL IMPRESSION(S) / ED DIAGNOSES  Final diagnoses:  ATN (acute tubular necrosis) ([place])     MEDICATIONS GIVEN DURING THIS VISIT:  Medications - No data to display   NEW OUTPATIENT MEDICATIONS STARTED DURING THIS VISIT:  New Prescriptions   No medications on file    Note:  This note was prepared with assistance of Dragon voice recognition software. Occasional wrong-word or sound-a-like substitutions may have occurred due to the inherent limitations of voice recognition software.   Quincee Gittens, Corene Cornea, MD 03/28/18 [phone]

## 2018-03-27 ENCOUNTER — Observation Stay (HOSPITAL_COMMUNITY): Payer: BLUE CROSS/BLUE SHIELD

## 2018-03-27 ENCOUNTER — Telehealth (INDEPENDENT_AMBULATORY_CARE_PROVIDER_SITE_OTHER): Payer: Self-pay

## 2018-03-27 DIAGNOSIS — N179 Acute kidney failure, unspecified: Secondary | ICD-10-CM | POA: Diagnosis not present

## 2018-03-27 DIAGNOSIS — I10 Essential (primary) hypertension: Secondary | ICD-10-CM | POA: Diagnosis not present

## 2018-03-27 DIAGNOSIS — N183 Chronic kidney disease, stage 3 (moderate): Secondary | ICD-10-CM | POA: Diagnosis not present

## 2018-03-27 DIAGNOSIS — N170 Acute kidney failure with tubular necrosis: Secondary | ICD-10-CM | POA: Diagnosis not present

## 2018-03-27 DIAGNOSIS — J449 Chronic obstructive pulmonary disease, unspecified: Secondary | ICD-10-CM | POA: Diagnosis not present

## 2018-03-27 LAB — COMPREHENSIVE METABOLIC PANEL
ALBUMIN: 3.3 g/dL — AB (ref 3.5–5.0)
ALT: 17 U/L (ref 0–44)
AST: 17 U/L (ref 15–41)
Alkaline Phosphatase: 77 U/L (ref 38–126)
Anion gap: 8 (ref 5–15)
BILIRUBIN TOTAL: 0.5 mg/dL (ref 0.3–1.2)
BUN: 27 mg/dL — AB (ref 6–20)
CHLORIDE: 111 mmol/L (ref 98–111)
CO2: 22 mmol/L (ref 22–32)
CREATININE: 1.78 mg/dL — AB (ref 0.61–1.24)
Calcium: 9.1 mg/dL (ref 8.9–10.3)
GFR calc Af Amer: 47 mL/min — ABNORMAL LOW (ref >60)
GFR calc non Af Amer: 40 mL/min — ABNORMAL LOW (ref >60)
GLUCOSE: 98 mg/dL (ref 70–99)
Potassium: 4 mmol/L (ref 3.5–5.1)
SODIUM: 141 mmol/L (ref 135–145)
Total Protein: 8.3 g/dL — ABNORMAL HIGH (ref 6.5–8.1)

## 2018-03-27 LAB — CBC
HCT: 36.2 % — ABNORMAL LOW (ref 39.0–52.0)
Hemoglobin: 11.5 g/dL — ABNORMAL LOW (ref 13.0–17.0)
MCH: 27.3 pg (ref 26.0–34.0)
MCHC: 31.8 g/dL (ref 30.0–36.0)
MCV: 86 fL (ref 78.0–100.0)
PLATELETS: 252 10*3/uL (ref 150–400)
RBC: 4.21 MIL/uL — ABNORMAL LOW (ref 4.22–5.81)
RDW: 16.9 % — AB (ref 11.5–15.5)
WBC: 8.2 10*3/uL (ref 4.0–10.5)

## 2018-03-27 LAB — RPR: RPR Ser Ql: NONREACTIVE

## 2018-03-27 LAB — C3 COMPLEMENT: C3 Complement: 185 mg/dL — ABNORMAL HIGH (ref 82–167)

## 2018-03-27 LAB — C4 COMPLEMENT: Complement C4, Body Fluid: 36 mg/dL (ref 14–44)

## 2018-03-27 MED ORDER — HYDROCODONE-ACETAMINOPHEN 5-325 MG PO TABS: 1.0000 | ORAL_TABLET | Freq: Four times a day (QID) | ORAL | 0 refills | Status: DC | PRN

## 2018-03-27 NOTE — Progress Notes (Signed)
Pt discharged in stable condition via wheelchair into the care of his family via private vehicle.  PIV removed intact w/o S&S of complications.  Discharge instructions given to pt.  Pt verbalized understanding.

## 2018-03-27 NOTE — Telephone Encounter (Signed)
I just looked him up and it looks like he is currently in the hospital and admitted for some type of rash or something else.  He also has severe COPD and chronic kidney disease.  He would need medical clearance prior to scheduling any type of surgery for him.

## 2018-03-27 NOTE — Telephone Encounter (Signed)
Patient left voice mail stating in a lot of pain and wants to schedule THA as soon as possible.  See current hospital admission.  Please advise.  [phone]

## 2018-03-27 NOTE — Plan of Care (Signed)
Adequate for discharge.

## 2018-03-27 NOTE — Progress Notes (Signed)
Subjective: Interval History: has no complaint of nausea or vomiting.  Patient also denies any difficulty breathing.  Presently he is feeling much better..  Objective: Vital signs in last 24 hours: Temp:  [98 F (36.7 C)-98.6 F (37 C)] 98.6 F (37 C) (07/22 0515) Pulse Rate:  [83-95] 88 (07/22 0515) Resp:  [18-20] 18 (07/22 0515) BP: (104-138)/(75-97) 125/75 (07/22 0515) SpO2:  [90 %-99 %] 94 % (07/22 0819) Weight:  [114 kg (251 lb 5.2 oz)] 114 kg (251 lb 5.2 oz) (07/21 1014) Weight change: -6.203 kg (-13 lb 10.8 oz)  Intake/Output from previous day: 07/21 0701 - 07/22 0700 In: 728.3 [P.O.:480; I.V.:248.3] Out: 800 [Urine:800] Intake/Output this shift: No intake/output data recorded.  General appearance: alert, cooperative and no distress Resp: clear to auscultation bilaterally Cardio: regular rate and rhythm Extremities: No edema  Lab Results: Recent Labs    03/26/18 0746 03/27/18 0450  WBC 10.5 8.2  HGB 12.1* 11.5*  HCT 38.2* 36.2*  PLT 246  273 252   BMET:  Recent Labs    03/26/18 0746 03/27/18 0450  NA 138 141  K 4.1 4.0  CL 107 111  CO2 21* 22  GLUCOSE 94 98  BUN 37* 27*  CREATININE 3.04* 1.78*  CALCIUM 9.6 9.1   No results for input(s): PTH in the last 72 hours. Iron Studies: No results for input(s): IRON, TIBC, TRANSFERRIN, FERRITIN in the last 72 hours.  Studies/Results: Dg Chest 2 View  Result Date: 03/26/2018 CLINICAL DATA:  Cough and shortness of breath. Rash. History of COPD. EXAM: CHEST - 2 VIEW COMPARISON:  03/08/2018 FINDINGS: The cardiomediastinal silhouette is unchanged and within normal limits. Chronic bronchitic changes are similar to the prior study. No confluent airspace opacity, edema, pleural effusion, or pneumothorax is identified. No acute osseous abnormality is seen. IMPRESSION: No active cardiopulmonary disease. Electronically Signed   By: Logan Bores M.D.   On: 03/26/2018 08:49    I have reviewed the patient's current  medications.  Assessment/Plan: 1] acute kidney injury superimposed on chronic.  Possibly ATN/AIN.  Presently his kidney function is improving.  Patient had 800 cc of urine output.  Presently he is a symptomatic. 2] chronic renal failure: Stage III.  Etiology was thought to be secondary to hypertension/hep C/obesity related glomerulopathy. 3] hypertension: His blood pressure is reasonably controlled 4] anemia 5] skin rash: Presently etiology is not clear.  Seems to be improving. 6] history of sleep apnea Plan: Continue his present management 2] we will check his renal panel in the morning.    LOS: 0 days   Redding Cloe S 03/27/2018,8:34 AM

## 2018-03-27 NOTE — Discharge Summary (Signed)
Physician Discharge Summary  Mathew Lara YHC:[id number] DOB: 10-Jul-1961 DOA: 03/26/2018  PCP: [name], MD  Admit date: 03/26/2018 Discharge date: 03/27/2018  Admitted From: Home Disposition: Home  Recommendations for Outpatient Follow-up:  1. Follow up with PCP in 1-2 weeks 2. Please obtain BMP/CBC in one week 3. Patient will follow-up with nephrology in the next 2 to 3 weeks.  Autoimmune work-up will be followed up by nephrology 4. He has been scheduled to follow-up with dermatology on 7/25 5. He will follow-up with orthopedics for further management of his hip  Discharge Condition: Stable CODE STATUS: Full code Diet recommendation: Heart healthy  Brief/Interim Summary: 57 year old male with a history of chronic kidney disease stage III, left hip osteoarthritis, hypertension and COPD, was in his usual state of health when he was at work on the night prior to admission.  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  He was wearing protective clothing.  He began feeling a burning sensation in his feet bilaterally.  When he removed his clothing, he noticed a rash developing on his lower extremities bilaterally.  HEENT to the ER for evaluation where he was noted to have a rise in his creatinine from 1.7-3.04.  He does admit to using ibuprofen regularly for his hip pain.  He denied any recent tick bites.  He is not had any fevers.  He does not report using any new soaps/detergents.  He was admitted for further treatments.  Discharge Diagnoses:  Active Problems:   Essential hypertension, benign   CKD (chronic kidney disease), stage III (HCC)   COPD (chronic obstructive pulmonary disease) (HCC)   Acute kidney injury (HCC)   Rash  1. Rash.  Patient found to have acute onset of petechial rash on lower extremities bilaterally.  This appeared to have spared his trunk.  Etiology is unclear.  ESR was not significantly elevated.  He did not have any fever or signs of  infection.  Patient does not appear toxic.  Rash did not involve mucous membranes.  He did not have any significant thrombocytopenia and LDH was noted to be normal.  Extensive autoimmune work-up has been sent which is currently in process.  Patient is feeling better.  Although the rash is still present.  He is been scheduled to see dermatology on 7/25 for further evaluation. 2. Acute kidney injury on chronic kidney disease stage III.  Possibly related to NSAIDs.  He was seen by nephrology in consultation.  Renal ultrasound was unrevealing.  Urinalysis did not show any hematuria.  He was started on IV hydration with improvement of creatinine back down to 1.7.  He is been advised not to take any further NSAIDs.  COPD. 3. He is currently breathing comfortably on room air.  Respiratory status appears to be at baseline. 4. Hypertension.  Blood pressure currently stable.  He is continued on his home medications 5. Left hip osteoarthritis.  Patient has significant pain from arthritis and will eventually need surgical management.  He is being followed by orthopedics.  He plans on contacting his orthopedist to see if surgery can be moved up.  Per Lyndel Safe revised cardiac risk index, with patient's comorbidities, he has a 1% risk of myocardial infarction or cardiac arrest, intraoperatively or up to 30 days postop.   Discharge Instructions  Discharge Instructions    Diet - low sodium heart healthy   Complete by:  As directed    Increase activity slowly   Complete by:  As directed  Allergies as of 03/27/2018   Not on File     Medication List    TAKE these medications   acetaminophen 650 MG CR tablet Commonly known as:  TYLENOL Take 1,300 mg by mouth every 8 (eight) hours as needed for pain.   albuterol 108 (90 Base) MCG/ACT inhaler Commonly known as:  PROVENTIL HFA;VENTOLIN HFA Inhale 1-2 puffs into the lungs every 6 (six) hours as needed for wheezing or shortness of breath.   amLODipine 10 MG  tablet Commonly known as:  NORVASC Take 10 mg by mouth daily.   aspirin 81 MG chewable tablet Chew 1 tablet (81 mg total) by mouth 2 (two) times daily.   diphenhydrAMINE 25 MG tablet Commonly known as:  BENADRYL Take 1 tablet (25 mg total) by mouth every 6 (six) hours.   famotidine 20 MG tablet Commonly known as:  PEPCID Take 1 tablet (20 mg total) by mouth 2 (two) times daily.   HYDROcodone-acetaminophen 5-325 MG tablet Commonly known as:  NORCO/VICODIN Take 1 tablet by mouth every 6 (six) hours as needed for moderate pain.   SYMBICORT 160-4.5 MCG/ACT inhaler Generic drug:  budesonide-formoterol Inhale two puffs twice daily   traMADol 50 MG tablet Commonly known as:  ULTRAM Take 1-2 tablets (50-100 mg total) by mouth 3 (three) times daily as needed.      Follow-up Information    Levy Sjogren, MD Follow up on 03/30/2018.   Specialty:  Dermatology Why:  11:10am Please come to [facility] office. call for address Contact information: [address] [place] [address] [phone]        Fran Lowes, MD Follow up.   Specialty:  Nephrology Why:  call for appointment in 2-3 weeks Contact information: [address] [place] [address] [phone]          Not on File  Consultations:  Nephrology   Procedures/Studies: Dg Chest 2 View  Result Date: 03/26/2018 CLINICAL DATA:  Cough and shortness of breath. Rash. History of COPD. EXAM: CHEST - 2 VIEW COMPARISON:  03/08/2018 FINDINGS: The cardiomediastinal silhouette is unchanged and within normal limits. Chronic bronchitic changes are similar to the prior study. No confluent airspace opacity, edema, pleural effusion, or pneumothorax is identified. No acute osseous abnormality is seen. IMPRESSION: No active cardiopulmonary disease. Electronically Signed   By: Logan Bores M.D.   On: 03/26/2018 08:49   Dg Chest 2 View  Result Date: 03/08/2018 CLINICAL DATA:  Left-sided  chest pain and shortness of breath while at work today. History of bronchitis and hypertension. Former smoker. EXAM: CHEST - 2 VIEW COMPARISON:  11/22/2017 FINDINGS: Heart size and pulmonary vascularity are normal. There is central interstitial change with mild peribronchial thickening and mild central bronchiectasis likely indicating chronic bronchitic changes. Emphysematous changes in the upper lungs. No airspace disease or consolidation. No blunting of costophrenic angles. No pneumothorax. Mediastinal contours appear intact. Calcification of the aorta. Similar appearance to previous study. IMPRESSION: Emphysematous and chronic bronchitic changes in the lungs. No evidence of active pulmonary disease. Electronically Signed   By: Lucienne Capers M.D.   On: 03/08/2018 03:43   US Renal  Result Date: 03/27/2018 CLINICAL DATA:  Acute tubular necrosis EXAM: RENAL / URINARY TRACT ULTRASOUND COMPLETE COMPARISON:  None. FINDINGS: Right Kidney: Length: 11.5 cm. Echogenicity within normal limits. No mass or hydronephrosis visualized. Left Kidney: Length: 11.8 cm. Echogenicity within normal limits. No mass or hydronephrosis visualized. Bladder: Appears normal for degree of bladder distention. IMPRESSION: No acute abnormality noted.  Electronically Signed   By: Inez Catalina M.D.   On: 03/27/2018 11:28   Nm Pulmonary Perf And Vent  Result Date: 03/08/2018 CLINICAL DATA:  Shortness of breath and chest pain EXAM: NUCLEAR MEDICINE VENTILATION - PERFUSION LUNG SCAN VIEWS: Anterior, posterior, left lateral, right lateral, RPO, LPO, RAO, LAO-ventilation and perfusion RADIOPHARMACEUTICALS:  33.0 mCi of Tc-73mDTPA aerosol inhalation and 4.4 mCi Tc963mAA IV COMPARISON:  Chest radiograph March 08, 2018 FINDINGS: Ventilation: Radiotracer uptake is homogeneous and symmetric bilaterally. No ventilation defects are demonstrable. Perfusion: Radiotracer uptake is homogeneous and symmetric bilaterally. No appreciable perfusion defects.  IMPRESSION: No appreciable ventilation or perfusion defects. This study constitutes a very low probability of pulmonary embolus. Electronically Signed   By: WiLowella GripII M.D.   On: 03/08/2018 10:32   Xr Hip Unilat W Or W/o Pelvis 1v Left  Result Date: 03/15/2018 An AP pelvis and lateral left hip shows a total hip arthroplasty on the right side.  The left side shows severe osteoarthritis of the left hip.  There may be even a component of avascular necrosis.  There is severe flattening of the femoral head and loss of joint space.      Subjective: Feeling better today.  Rash is not itching.  No shortness of breath.  Wants to go home.  Discharge Exam: Vitals:   03/27/18 1430 03/27/18 1431  BP:  124/83  Pulse: 92 97  Resp:  20  Temp:  98 F (36.7 C)  SpO2: 96% 96%   Vitals:   03/27/18 0515 03/27/18 0819 03/27/18 1430 03/27/18 1431  BP: 125/75   124/83  Pulse: 88  92 97  Resp: 18   20  Temp: 98.6 F (37 C)   98 F (36.7 C)  TempSrc: Oral   Oral  SpO2: 93% 94% 96% 96%  Weight:      Height:        General: Pt is alert, awake, not in acute distress Cardiovascular: RRR, S1/S2 +, no rubs, no gallops Respiratory: CTA bilaterally, no wheezing, no rhonchi Abdominal: Soft, NT, ND, bowel sounds + Extremities: Petechial rash noted over lower extremities bilaterally.  Also involves left upper extremity.    The results of significant diagnostics from this hospitalization (including imaging, microbiology, ancillary and laboratory) are listed below for reference.     Microbiology: No results found for this or any previous visit (from the past 240 hour(s)).   Labs: BNP (last 3 results) No results for input(s): BNP in the last 8760 hours. Basic Metabolic Panel: Recent Labs  Lab 03/26/18 0746 03/27/18 0450  NA 138 141  K 4.1 4.0  CL 107 111  CO2 21* 22  GLUCOSE 94 98  BUN 37* 27*  CREATININE 3.04* 1.78*  CALCIUM 9.6 9.1   Liver Function Tests: Recent Labs  Lab  03/26/18 0746 03/26/18 1217 03/27/18 0450  AST _0 ALT _1 ALKPHOS 92 90 77  BILITOT 0.3 0.4 0.5  PROT 9.6* 9.3* 8.3*  ALBUMIN 3.9 3.8 3.3*   No results for input(s): LIPASE, AMYLASE in the last 168 hours. No results for input(s): AMMONIA in the last 168 hours. CBC: Recent Labs  Lab 03/26/18 0746 03/27/18 0450  WBC 10.5 8.2  NEUTROABS 8.2*  --   HGB 12.1* 11.5*  HCT 38.2* 36.2*  MCV 86.0 86.0  PLT 246  273 252   Cardiac Enzymes: No results for input(s): CKTOTAL, CKMB, CKMBINDEX, TROPONINI in the last 168 hours. BNP: Invalid input(s):  POCBNP CBG: No results for input(s): GLUCAP in the last 168 hours. D-Dimer Recent Labs    03/26/18 0746  DDIMER 3.41*   Hgb A1c No results for input(s): HGBA1C in the last 72 hours. Lipid Profile No results for input(s): CHOL, HDL, LDLCALC, TRIG, CHOLHDL, LDLDIRECT in the last 72 hours. Thyroid function studies No results for input(s): TSH, T4TOTAL, T3FREE, THYROIDAB in the last 72 hours.  Invalid input(s): FREET3 Anemia work up No results for input(s): VITAMINB12, FOLATE, FERRITIN, TIBC, IRON, RETICCTPCT in the last 72 hours. Urinalysis    Component Value Date/Time   COLORURINE YELLOW 03/26/2018 0721   APPEARANCEUR CLOUDY (A) 03/26/2018 0721   LABSPEC 1.021 03/26/2018 0721   PHURINE 5.0 03/26/2018 0721   GLUCOSEU NEGATIVE 03/26/2018 0721   HGBUR NEGATIVE 03/26/2018 0721   BILIRUBINUR NEGATIVE 03/26/2018 0721   KETONESUR NEGATIVE 03/26/2018 0721   PROTEINUR 100 (A) 03/26/2018 0721   UROBILINOGEN 0.2 07/06/2013 1024   NITRITE NEGATIVE 03/26/2018 0721   LEUKOCYTESUR NEGATIVE 03/26/2018 0721   Sepsis Labs Invalid input(s): PROCALCITONIN,  WBC,  LACTICIDVEN Microbiology No results found for this or any previous visit (from the past 240 hour(s)).   Time coordinating discharge: 59mns  SIGNED:   JKathie Dike MD  [facility] 03/27/2018, 7:56 PM Pager   If 7PM-7AM, please contact  night-coverage [email or web address] Password [email or web address]

## 2018-03-28 LAB — PROTEIN ELECTROPHORESIS, SERUM
A/G Ratio: 0.8 (ref 0.7–1.7)
Albumin ELP: 3.7 g/dL (ref 2.9–4.4)
Alpha-1-Globulin: 0.3 g/dL (ref 0.0–0.4)
Alpha-2-Globulin: 1.2 g/dL — ABNORMAL HIGH (ref 0.4–1.0)
Beta Globulin: 1.5 g/dL — ABNORMAL HIGH (ref 0.7–1.3)
Gamma Globulin: 2 g/dL — ABNORMAL HIGH (ref 0.4–1.8)
Globulin, Total: 4.9 g/dL — ABNORMAL HIGH (ref 2.2–3.9)
Total Protein ELP: 8.6 g/dL — ABNORMAL HIGH (ref 6.0–8.5)

## 2018-03-28 LAB — HEPATITIS PANEL, ACUTE
HCV Ab: >11 s/co ratio — ABNORMAL HIGH (ref 0.0–0.9)
Hep A IgM: NEGATIVE
Hep B C IgM: NEGATIVE
Hepatitis B Surface Ag: NEGATIVE

## 2018-03-28 LAB — IMMUNOFIXATION ELECTROPHORESIS
IgA: 696 mg/dL — ABNORMAL HIGH (ref 90–386)
IgG (Immunoglobin G), Serum: 2188 mg/dL — ABNORMAL HIGH (ref 700–1600)
IgM (Immunoglobulin M), Srm: 96 mg/dL (ref 20–172)
Total Protein ELP: 8.6 g/dL — ABNORMAL HIGH (ref 6.0–8.5)

## 2018-03-28 LAB — ANTI-DNA ANTIBODY, DOUBLE-STRANDED: ds DNA Ab: <1 IU/mL (ref 0–9)

## 2018-03-28 LAB — MPO/PR-3 (ANCA) ANTIBODIES: Myeloperoxidase Abs: <9 U/mL (ref 0.0–9.0)

## 2018-03-28 LAB — GLOMERULAR BASEMENT MEMBRANE ANTIBODIES: GBM Ab: 4 units (ref 0–20)

## 2018-03-28 LAB — COMPLEMENT, TOTAL: Compl, Total (CH50): >60 U/mL (ref 42–999999)

## 2018-03-28 LAB — ANA W/REFLEX IF POSITIVE: Anti Nuclear Antibody(ANA): NEGATIVE

## 2018-03-29 LAB — ROCKY MTN SPOTTED FVR ABS PNL(IGG+IGM)
RMSF IgG: NEGATIVE
RMSF IgM: 0.26 index (ref 0.00–0.89)

## 2018-03-29 NOTE — Telephone Encounter (Signed)
Spoke with patient and he is aware clearances will need to be given before surgery can be scheduled.  I will send letters to the different offices to request clearance.

## 2018-03-30 DIAGNOSIS — L958 Other vasculitis limited to the skin: Secondary | ICD-10-CM | POA: Diagnosis not present

## 2018-03-31 DIAGNOSIS — G4733 Obstructive sleep apnea (adult) (pediatric): Secondary | ICD-10-CM | POA: Diagnosis not present

## 2018-03-31 DIAGNOSIS — Z1389 Encounter for screening for other disorder: Secondary | ICD-10-CM | POA: Diagnosis not present

## 2018-03-31 DIAGNOSIS — G894 Chronic pain syndrome: Secondary | ICD-10-CM | POA: Diagnosis not present

## 2018-03-31 DIAGNOSIS — Z6838 Body mass index (BMI) 38.0-38.9, adult: Secondary | ICD-10-CM | POA: Diagnosis not present

## 2018-03-31 DIAGNOSIS — J449 Chronic obstructive pulmonary disease, unspecified: Secondary | ICD-10-CM | POA: Diagnosis not present

## 2018-03-31 DIAGNOSIS — I10 Essential (primary) hypertension: Secondary | ICD-10-CM | POA: Diagnosis not present

## 2018-03-31 DIAGNOSIS — N183 Chronic kidney disease, stage 3 (moderate): Secondary | ICD-10-CM | POA: Diagnosis not present

## 2018-04-03 ENCOUNTER — Ambulatory Visit: Payer: Self-pay | Admitting: Cardiology

## 2018-04-17 ENCOUNTER — Emergency Department (HOSPITAL_COMMUNITY)
Admission: EM | Admit: 2018-04-17 | Discharge: 2018-04-17 | Disposition: A | Discharge status: Home / Self Care | Payer: BLUE CROSS/BLUE SHIELD | Source: Home / Self Care | Attending: Emergency Medicine | Admitting: Emergency Medicine

## 2018-04-17 ENCOUNTER — Encounter (HOSPITAL_COMMUNITY): Payer: Self-pay | Admitting: Emergency Medicine

## 2018-04-17 ENCOUNTER — Other Ambulatory Visit: Payer: Self-pay

## 2018-04-17 DIAGNOSIS — Z87891 Personal history of nicotine dependence: Secondary | ICD-10-CM | POA: Diagnosis not present

## 2018-04-17 DIAGNOSIS — Z96641 Presence of right artificial hip joint: Secondary | ICD-10-CM | POA: Diagnosis not present

## 2018-04-17 DIAGNOSIS — N183 Chronic kidney disease, stage 3 (moderate): Secondary | ICD-10-CM | POA: Diagnosis not present

## 2018-04-17 DIAGNOSIS — G4730 Sleep apnea, unspecified: Secondary | ICD-10-CM | POA: Diagnosis not present

## 2018-04-17 DIAGNOSIS — Z7951 Long term (current) use of inhaled steroids: Secondary | ICD-10-CM | POA: Diagnosis not present

## 2018-04-17 DIAGNOSIS — K449 Diaphragmatic hernia without obstruction or gangrene: Secondary | ICD-10-CM | POA: Diagnosis not present

## 2018-04-17 DIAGNOSIS — Z79899 Other long term (current) drug therapy: Secondary | ICD-10-CM | POA: Diagnosis not present

## 2018-04-17 DIAGNOSIS — T18128A Food in esophagus causing other injury, initial encounter: Secondary | ICD-10-CM | POA: Diagnosis not present

## 2018-04-17 DIAGNOSIS — J984 Other disorders of lung: Secondary | ICD-10-CM | POA: Diagnosis not present

## 2018-04-17 DIAGNOSIS — Z7982 Long term (current) use of aspirin: Secondary | ICD-10-CM | POA: Diagnosis not present

## 2018-04-17 DIAGNOSIS — J449 Chronic obstructive pulmonary disease, unspecified: Secondary | ICD-10-CM | POA: Diagnosis not present

## 2018-04-17 DIAGNOSIS — K222 Esophageal obstruction: Secondary | ICD-10-CM | POA: Diagnosis not present

## 2018-04-17 DIAGNOSIS — I129 Hypertensive chronic kidney disease with stage 1 through stage 4 chronic kidney disease, or unspecified chronic kidney disease: Secondary | ICD-10-CM | POA: Diagnosis not present

## 2018-04-17 DIAGNOSIS — X58XXXA Exposure to other specified factors, initial encounter: Secondary | ICD-10-CM | POA: Diagnosis not present

## 2018-04-17 LAB — BASIC METABOLIC PANEL
Anion gap: 10 (ref 5–15)
BUN: 24 mg/dL — ABNORMAL HIGH (ref 6–20)
CHLORIDE: 104 mmol/L (ref 98–111)
CO2: 25 mmol/L (ref 22–32)
CREATININE: 1.57 mg/dL — AB (ref 0.61–1.24)
Calcium: 9.7 mg/dL (ref 8.9–10.3)
GFR calc Af Amer: 54 mL/min — ABNORMAL LOW (ref >60)
GFR calc non Af Amer: 47 mL/min — ABNORMAL LOW (ref >60)
GLUCOSE: 99 mg/dL (ref 70–99)
Potassium: 3.9 mmol/L (ref 3.5–5.1)
SODIUM: 139 mmol/L (ref 135–145)

## 2018-04-17 LAB — CBC WITH DIFFERENTIAL/PLATELET
Basophils Absolute: 0 10*3/uL (ref 0.0–0.1)
Basophils Relative: 0 %
EOS ABS: 0.3 10*3/uL (ref 0.0–0.7)
EOS PCT: 4 %
HCT: 38.8 % — ABNORMAL LOW (ref 39.0–52.0)
HEMOGLOBIN: 12.2 g/dL — AB (ref 13.0–17.0)
LYMPHS ABS: 1.2 10*3/uL (ref 0.7–4.0)
LYMPHS PCT: 14 %
MCH: 27.3 pg (ref 26.0–34.0)
MCHC: 31.4 g/dL (ref 30.0–36.0)
MCV: 86.8 fL (ref 78.0–100.0)
MONOS PCT: 6 %
Monocytes Absolute: 0.5 10*3/uL (ref 0.1–1.0)
Neutro Abs: 6.8 10*3/uL (ref 1.7–7.7)
Neutrophils Relative %: 76 %
PLATELETS: 227 10*3/uL (ref 150–400)
RBC: 4.47 MIL/uL (ref 4.22–5.81)
RDW: 17.5 % — ABNORMAL HIGH (ref 11.5–15.5)
WBC: 8.8 10*3/uL (ref 4.0–10.5)

## 2018-04-17 MED ORDER — PANTOPRAZOLE SODIUM 20 MG PO TBEC
20.0000 mg | DELAYED_RELEASE_TABLET | Freq: Every day | ORAL | 0 refills | Status: DC
Start: 2018-04-17 — End: 2018-04-18

## 2018-04-17 MED ORDER — GLUCAGON HCL RDNA (DIAGNOSTIC) 1 MG IJ SOLR
1.0000 mg | Freq: Once | INTRAMUSCULAR | Status: AC
  Administered 2018-04-17: 1 mg via INTRAVENOUS
  Filled 2018-04-17: qty 1

## 2018-04-17 NOTE — ED Provider Notes (Signed)
[facility] EMERGENCY DEPARTMENT Provider Note   CSN: [id number] Arrival date & time: 04/17/18  6734     History   Chief Complaint Chief Complaint  Patient presents with  . Swallowed Foreign Body    HPI Mathew Lara is a 57 y.o. male with a history as outlined below, most significant for alcohol abuse, intermittent acid reflux disease with a prior history of esophageal food impaction and Schatzki's ring per endoscopy presenting with food impaction.  He ate steak last night and reported after swallowing a bite was unable to consume any further food, stating he has had multiple episodes of regurgitation of food, but has been able to tolerate small sips of fluid and he has been able to handle his saliva.  He denies chest pain or pressure at baseline, but feels development of pressure and he tries to eat or drink.  He had a similar occurrence 4 years ago which resolved with a dose of IV glucagon.  He denies any recent significant symptoms with acid reflux disease.  The history is provided by the patient.    Past Medical History:  Diagnosis Date  . Alcohol abuse   . Anxiety   . Bronchitis   . Chronic hip pain   . Chronic kidney disease (CKD) stage G3b/A3, moderately decreased glomerular filtration rate (GFR) between 30-44 mL/min/1.73 square meter and albuminuria creatinine ratio greater than 300 mg/g ([place]) 01/03/2018  . Hepatitis C   . Hypertension   . Mixed hyperlipidemia   . Renal disorder    states renal dysfunction, "strained kidney"  . Sleep apnea     Patient Active Problem List   Diagnosis Date Noted  . Acute kidney injury ([place]) 03/26/2018  . Rash 03/26/2018  . Chest pain 03/08/2018  . CKD (chronic kidney disease), stage III ([place]) 03/08/2018  . COPD (chronic obstructive pulmonary disease) ([place]) 03/08/2018  . Avascular necrosis of bone of left hip ([place]) 07/27/2017  . Avascular necrosis of bone of right hip ([place]) 06/04/2016  . Status post total replacement of right hip  06/04/2016  . Hepatitis C 03/21/2015  . History of alcohol abuse 11/08/2013  . Tobacco abuse 11/08/2013  . Essential hypertension, benign 11/07/2013  . Abnormal ECG 11/07/2013    Past Surgical History:  Procedure Laterality Date  . COLONOSCOPY N/A 01/23/2014   SLF:The LEFT COLON IS EXTREMELY redundant/TWO RECTAL POLYPS REMOVED/SMALL VOLUME RECTAL BLEEDING MOST LIKELY DUE TO Small internal hemorrhoids  . ESOPHAGOGASTRODUODENOSCOPY N/A 01/23/2014   LPF:[id number] ring at the gastroesophageal junction/Medium sized hiatal hernia/NDYSPEPSIA MOST LIKELY DUE TO GERD/MILD Non-erosive gastritis  . HEMORRHOID BANDING  01/23/2014   Procedure: HEMORRHOID BANDING;  Surgeon: Danie Binder, MD;  Location: [facility] ENDO SUITE;  Service: Endoscopy;;  . MOUTH SURGERY    . TOTAL HIP ARTHROPLASTY Right 06/04/2016   Procedure: RIGHT TOTAL HIP ARTHROPLASTY ANTERIOR APPROACH;  Surgeon: Mcarthur Rossetti, MD;  Location: [facility] ORS;  Service: Orthopedics;  Laterality: Right;        Home Medications    Prior to Admission medications   Medication Sig Start Date End Date Taking? Authorizing Provider  albuterol (PROVENTIL HFA;VENTOLIN HFA) 108 (90 Base) MCG/ACT inhaler Inhale 1-2 puffs into the lungs every 6 (six) hours as needed for wheezing or shortness of breath.   Yes [provider]  amLODipine (NORVASC) 10 MG tablet Take 10 mg by mouth daily.   Yes [provider]  aspirin 81 MG chewable tablet Chew 1 tablet (81 mg total) by mouth 2 (two) times daily.  06/06/16  Yes Mcarthur Rossetti, MD  HYDROcodone-acetaminophen (NORCO/VICODIN) 5-325 MG tablet Take 1 tablet by mouth every 6 (six) hours as needed for moderate pain. 03/27/18  Yes Kathie Dike, MD  SYMBICORT 160-4.5 MCG/ACT inhaler Inhale two puffs twice daily 11/22/17  Yes [provider]  pantoprazole (PROTONIX) 20 MG tablet Take 1 tablet (20 mg total) by mouth daily. 04/17/18   Evalee Jefferson, PA-C    Family History Family History    Problem Relation Age of Onset  . Cancer Mother     Social History Social History   Tobacco Use  . Smoking status: Former Smoker    Types: Cigarettes    Last attempt to quit: 05/28/2014    Years since quitting: 3.8  . Smokeless tobacco: Never Used  . Tobacco comment: quit in November 2015 after smoking x 20 yrs.  Substance Use Topics  . Alcohol use: No    Alcohol/week: 0.0 standard drinks  . Drug use: No     Allergies   Patient has no known allergies.   Review of Systems Review of Systems  Constitutional: Negative for fever.  HENT: Negative for congestion and sore throat.   Eyes: Negative.   Respiratory: Positive for chest tightness. Negative for shortness of breath.   Cardiovascular: Negative for chest pain.  Gastrointestinal: Positive for vomiting. Negative for abdominal pain and nausea.  Genitourinary: Negative.   Musculoskeletal: Negative for arthralgias, joint swelling and neck pain.  Skin: Negative.  Negative for rash and wound.  Neurological: Negative for dizziness, weakness, light-headedness, numbness and headaches.  Psychiatric/Behavioral: Negative.      Physical Exam Updated Vital Signs BP (!) 133/93 (BP Location: Right Arm)   Pulse 84   Temp 98.1 F (36.7 C) (Oral)   Resp 20   Ht 5\' 8"  (1.727 m)   Wt 116.1 kg   SpO2 95%   BMI 38.92 kg/m   Physical Exam  Constitutional: He appears well-developed and well-nourished. No distress.  HENT:  Head: Normocephalic and atraumatic.  Eyes: Conjunctivae are normal.  Neck: Normal range of motion.  Cardiovascular: Normal rate, regular rhythm, normal heart sounds and intact distal pulses.  Pulmonary/Chest: Effort normal and breath sounds normal. He has no wheezes.  Abdominal: Soft. Bowel sounds are normal. There is no tenderness.  Musculoskeletal: Normal range of motion.  Neurological: He is alert.  Skin: Skin is warm and dry.  Psychiatric: He has a normal mood and affect.  Nursing note and vitals  reviewed.    ED Treatments / Results  Labs (all labs ordered are listed, but only abnormal results are displayed) Labs Reviewed  CBC WITH DIFFERENTIAL/PLATELET - Abnormal; Notable for the following components:      Result Value   Hemoglobin 12.2 (*)    HCT 38.8 (*)    RDW 17.5 (*)    All other components within normal limits  BASIC METABOLIC PANEL - Abnormal; Notable for the following components:   BUN 24 (*)    Creatinine, Ser 1.57 (*)    GFR calc non Af Amer 47 (*)    GFR calc Af Amer 54 (*)    All other components within normal limits    EKG None  Radiology No results found.  Procedures Procedures (including critical care time)  Medications Ordered in ED Medications  glucagon (human recombinant) (GLUCAGEN) injection 1 mg (1 mg Intravenous Given 04/17/18 0934)     Initial Impression / Assessment and Plan / ED Course  I have reviewed the triage vital signs  and the nursing notes.  Pertinent labs & imaging results that were available during my care of the patient were reviewed by me and considered in my medical decision making (see chart for details).     Patient's labs were reviewed prior to discharge home.  He has chronic renal insufficiency which is improved today from prior lab values.  He was given IV glucagon x1 and had complete resolution of this esophageal food impaction.  He was able to tolerate p.o. ginger ale without nausea, vomiting or chest pressure.  PRN follow-up anticipated.  Referral to Dr. Oneida Alar as needed.  He was put on a short course of Protonix to help reduce reflux and distal esophageal inflammation.  Final Clinical Impressions(s) / ED Diagnoses   Final diagnoses:  Food impaction of esophagus, initial encounter    ED Discharge Orders         Ordered    pantoprazole (PROTONIX) 20 MG tablet  Daily     04/17/18 1106           Evalee Jefferson, Hershal Coria 04/17/18 1112    Davonna Belling, MD 04/17/18 1506

## 2018-04-17 NOTE — ED Triage Notes (Signed)
Patient states he was eating steak last night and felt a piece get stuck in his esophagus. States he has been unable to eat or drink anything since, it comes back up.

## 2018-04-17 NOTE — ED Notes (Signed)
Patient states that he feels better "feels like it's going on down".  Patient able to take small sips of fluids without difficulty.

## 2018-04-17 NOTE — Discharge Instructions (Addendum)
As discussed, make sure you slow down with eating, chewing your food very well before swallowing.  Also cut your meat into very small pieces.  You have been prescribed a short course of Protonix to help eliminate any acid reflux as can sometimes worsen this problem.  Plan to follow-up with Dr. Oneida Alar if he continued to have swallowing problems.

## 2018-04-18 ENCOUNTER — Encounter (HOSPITAL_COMMUNITY): Payer: Self-pay | Admitting: *Deleted

## 2018-04-18 ENCOUNTER — Encounter (HOSPITAL_COMMUNITY)
Admission: EM | Disposition: A | Discharge status: Home / Self Care | Payer: Self-pay | Source: Home / Self Care | Attending: Emergency Medicine

## 2018-04-18 ENCOUNTER — Emergency Department (HOSPITAL_COMMUNITY): Payer: BLUE CROSS/BLUE SHIELD

## 2018-04-18 ENCOUNTER — Ambulatory Visit (HOSPITAL_COMMUNITY): Admit: 2018-04-18 | Payer: BLUE CROSS/BLUE SHIELD | Admitting: Internal Medicine

## 2018-04-18 ENCOUNTER — Ambulatory Visit (HOSPITAL_COMMUNITY)
Admission: RE | Admit: 2018-04-18 | Discharge: 2018-04-18 | Disposition: A | Discharge status: Home / Self Care | Payer: BLUE CROSS/BLUE SHIELD | Source: Ambulatory Visit | Attending: Internal Medicine | Admitting: Internal Medicine

## 2018-04-18 ENCOUNTER — Emergency Department (HOSPITAL_COMMUNITY)
Admission: EM | Admit: 2018-04-18 | Discharge: 2018-04-18 | Disposition: A | Discharge status: Home / Self Care | Payer: BLUE CROSS/BLUE SHIELD | Source: Home / Self Care | Attending: Emergency Medicine | Admitting: Emergency Medicine

## 2018-04-18 ENCOUNTER — Other Ambulatory Visit: Payer: Self-pay

## 2018-04-18 ENCOUNTER — Encounter (HOSPITAL_COMMUNITY)
Admission: RE | Disposition: A | Discharge status: Home / Self Care | Payer: Self-pay | Source: Ambulatory Visit | Attending: Internal Medicine

## 2018-04-18 ENCOUNTER — Encounter (HOSPITAL_COMMUNITY): Payer: Self-pay

## 2018-04-18 DIAGNOSIS — Y929 Unspecified place or not applicable: Secondary | ICD-10-CM

## 2018-04-18 DIAGNOSIS — N183 Chronic kidney disease, stage 3 (moderate): Secondary | ICD-10-CM | POA: Insufficient documentation

## 2018-04-18 DIAGNOSIS — Z87891 Personal history of nicotine dependence: Secondary | ICD-10-CM | POA: Insufficient documentation

## 2018-04-18 DIAGNOSIS — I129 Hypertensive chronic kidney disease with stage 1 through stage 4 chronic kidney disease, or unspecified chronic kidney disease: Secondary | ICD-10-CM | POA: Insufficient documentation

## 2018-04-18 DIAGNOSIS — Y999 Unspecified external cause status: Secondary | ICD-10-CM | POA: Insufficient documentation

## 2018-04-18 DIAGNOSIS — K222 Esophageal obstruction: Secondary | ICD-10-CM | POA: Insufficient documentation

## 2018-04-18 DIAGNOSIS — J449 Chronic obstructive pulmonary disease, unspecified: Secondary | ICD-10-CM | POA: Insufficient documentation

## 2018-04-18 DIAGNOSIS — Z7982 Long term (current) use of aspirin: Secondary | ICD-10-CM | POA: Insufficient documentation

## 2018-04-18 DIAGNOSIS — T18128A Food in esophagus causing other injury, initial encounter: Secondary | ICD-10-CM

## 2018-04-18 DIAGNOSIS — X58XXXA Exposure to other specified factors, initial encounter: Secondary | ICD-10-CM

## 2018-04-18 DIAGNOSIS — Z96641 Presence of right artificial hip joint: Secondary | ICD-10-CM | POA: Insufficient documentation

## 2018-04-18 DIAGNOSIS — G4730 Sleep apnea, unspecified: Secondary | ICD-10-CM | POA: Insufficient documentation

## 2018-04-18 DIAGNOSIS — Z79899 Other long term (current) drug therapy: Secondary | ICD-10-CM | POA: Insufficient documentation

## 2018-04-18 DIAGNOSIS — Y939 Activity, unspecified: Secondary | ICD-10-CM

## 2018-04-18 DIAGNOSIS — K449 Diaphragmatic hernia without obstruction or gangrene: Secondary | ICD-10-CM | POA: Insufficient documentation

## 2018-04-18 DIAGNOSIS — J984 Other disorders of lung: Secondary | ICD-10-CM | POA: Diagnosis not present

## 2018-04-18 DIAGNOSIS — Z7951 Long term (current) use of inhaled steroids: Secondary | ICD-10-CM | POA: Insufficient documentation

## 2018-04-18 HISTORY — DX: Chronic obstructive pulmonary disease, unspecified: J44.9

## 2018-04-18 HISTORY — PX: ESOPHAGOGASTRODUODENOSCOPY: SHX5428

## 2018-04-18 LAB — BASIC METABOLIC PANEL
Anion gap: 8 (ref 5–15)
BUN: 24 mg/dL — AB (ref 6–20)
CO2: 26 mmol/L (ref 22–32)
CREATININE: 2.04 mg/dL — AB (ref 0.61–1.24)
Calcium: 9.8 mg/dL (ref 8.9–10.3)
Chloride: 105 mmol/L (ref 98–111)
GFR calc Af Amer: 40 mL/min — ABNORMAL LOW (ref >60)
GFR, EST NON AFRICAN AMERICAN: 34 mL/min — AB (ref >60)
GLUCOSE: 94 mg/dL (ref 70–99)
Potassium: 4 mmol/L (ref 3.5–5.1)
SODIUM: 139 mmol/L (ref 135–145)

## 2018-04-18 SURGERY — EGD (ESOPHAGOGASTRODUODENOSCOPY)
Anesthesia: Moderate Sedation

## 2018-04-18 MED ORDER — SODIUM CHLORIDE 0.9 % IV BOLUS
1000.0000 mL | Freq: Once | INTRAVENOUS | Status: AC
  Administered 2018-04-18: 1000 mL via INTRAVENOUS

## 2018-04-18 MED ORDER — SODIUM CHLORIDE 0.9 % IV SOLN: INTRAVENOUS | Status: DC

## 2018-04-18 MED ORDER — MIDAZOLAM HCL 5 MG/5ML IJ SOLN
INTRAMUSCULAR | Status: AC
  Filled 2018-04-18: qty 10

## 2018-04-18 MED ORDER — MEPERIDINE HCL 100 MG/ML IJ SOLN
INTRAMUSCULAR | Status: AC
  Filled 2018-04-18: qty 2

## 2018-04-18 MED ORDER — MEPERIDINE HCL 100 MG/ML IJ SOLN
INTRAMUSCULAR | Status: DC | PRN
  Administered 2018-04-18: 12.5 mg via INTRAVENOUS

## 2018-04-18 MED ORDER — ONDANSETRON HCL 4 MG/2ML IJ SOLN
INTRAMUSCULAR | Status: DC | PRN
  Administered 2018-04-18: 4 mg via INTRAVENOUS

## 2018-04-18 MED ORDER — SODIUM CHLORIDE 0.9 % IV SOLN
INTRAVENOUS | Status: DC
  Administered 2018-04-18: 12:00:00 via INTRAVENOUS

## 2018-04-18 MED ORDER — LIDOCAINE VISCOUS HCL 2 % MT SOLN
OROMUCOSAL | Status: AC
  Filled 2018-04-18: qty 15

## 2018-04-18 MED ORDER — BUTAMBEN-TETRACAINE-BENZOCAINE 2-2-14 % EX AERO
INHALATION_SPRAY | CUTANEOUS | Status: DC | PRN
  Administered 2018-04-18: 3 via TOPICAL

## 2018-04-18 MED ORDER — MIDAZOLAM HCL 5 MG/5ML IJ SOLN
INTRAMUSCULAR | Status: DC | PRN
  Administered 2018-04-18 (×2): 1 mg via INTRAVENOUS

## 2018-04-18 MED ORDER — SODIUM CHLORIDE 0.9 % IV SOLN
INTRAVENOUS | Status: DC
  Administered 2018-04-18: 11:00:00 via INTRAVENOUS

## 2018-04-18 MED ORDER — BUTAMBEN-TETRACAINE-BENZOCAINE 2-2-14 % EX AERO
INHALATION_SPRAY | CUTANEOUS | Status: AC
  Filled 2018-04-18: qty 5

## 2018-04-18 MED ORDER — STERILE WATER FOR IRRIGATION IR SOLN
Status: DC | PRN
  Administered 2018-04-18: 100 mL

## 2018-04-18 MED ORDER — ONDANSETRON HCL 4 MG/2ML IJ SOLN
INTRAMUSCULAR | Status: AC
  Filled 2018-04-18: qty 2

## 2018-04-18 NOTE — Op Note (Addendum)
[facility] Patient Name: Mathew Lara Procedure Date: 04/18/2018 11:45 AM MRN: [id number] Date of Birth: 07/21/1961 Attending MD: Norvel Richards , MD CSN: [id number] Age: 57 Admit Type: Outpatient Procedure:                Upper GI endoscopy Indications:              Foreign body in the esophagus Providers:                Norvel Richards, MD, Rosina Lowenstein, RN, Aram Candela Referring MD:              Medicines:                Meperidine 12.5 mg IV, Midazolam 2 mg IV,                            Ondansetron 4 mg IV Complications:            No immediate complications. Estimated Blood Loss:     Estimated blood loss: none. Procedure:                Pre-Anesthesia Assessment:                           - Prior to the procedure, a History and Physical                            was performed, and patient medications and                            allergies were reviewed. The patient's tolerance of                            previous anesthesia was also reviewed. The risks                            and benefits of the procedure and the sedation                            options and risks were discussed with the patient.                            All questions were answered, and informed consent                            was obtained. Prior Anticoagulants: The patient has                            taken no previous anticoagulant or antiplatelet                            agents. ASA Grade Assessment: III - A patient with  severe systemic disease. After reviewing the risks                            and benefits, the patient was deemed in                            satisfactory condition to undergo the procedure.                           After obtaining informed consent, the endoscope was                            passed under direct vision. Throughout the                            procedure, the patient's blood  pressure, pulse, and                            oxygen saturations were monitored continuously. The                            GIF-H190 ([id number]) scope was introduced through the                            mouth, and advanced to the body of the stomach. The                            upper GI endoscopy was accomplished without                            difficulty. The patient tolerated the procedure                            well. Scope In: 12:10:57 PM Scope Out: 12:17:06 PM Total Procedure Duration: 0 hours 6 minutes 9 seconds  Findings:      Food was found in the middle third of the esophagus. Patient had a large       meat bolus ball valving in the midesophagus. Please see photos. The       proximal end was grasped with a rescue net and was pulled out of the       patient's mouth all in one piece. A look back revealed inflamed mucosa       with a Schatzki's ring. Patient had a hiatal hernia. Complete       examination not carried out per plan. No Dilation carried out per plan.      A small hiatal hernia was present. Impression:               - Food in the middle third of the esophagus.                            Impaction removed as described above.                           - Small hiatal hernia. Examination limited as  described above.                           - No specimens collected. Moderate Sedation:      Moderate (conscious) sedation was administered by the endoscopy nurse       and supervised by the endoscopist. The following parameters were       monitored: oxygen saturation, heart rate, blood pressure, respiratory       rate, EKG, adequacy of pulmonary ventilation, and response to care.       Total physician intraservice time was 12 minutes. Recommendation:           - Patient has a contact number available for                            emergencies. The signs and symptoms of potential                            delayed complications were  discussed with the                            patient. Return to normal activities tomorrow.                            Written discharge instructions were provided to the                            patient.                           - Chopped diet.                           - Continue present medications. Wear dentures with                            eating. Swallowing precautions reviewed. Resume                            Protonix 40 mg daily.                           - Repeat upper endoscopy in 2 weeks for retreatment.                           - Return to GI clinic (date not yet determined). Procedure Code(s):        --- Professional ---                           [phone], 52, Esophagogastroduodenoscopy, flexible,                            transoral; diagnostic, including collection of                            specimen(s) by brushing or washing, when performed                            (  separate procedure)                           G0500, Moderate sedation services provided by the                            same physician or other qualified health care                            professional performing a gastrointestinal                            endoscopic service that sedation supports,                            requiring the presence of an independent trained                            observer to assist in the monitoring of the                            patient's level of consciousness and physiological                            status; initial 15 minutes of intra-service time;                            patient age 54 years or older (additional time may                            be reported with [phone], as appropriate) Diagnosis Code(s):        --- Professional ---                           [phone], Food in esophagus causing other injury,                            initial encounter                           K44.9, Diaphragmatic hernia without obstruction or                             gangrene                           T18.108A, Unspecified foreign body in esophagus                            causing other injury, initial encounter CPT copyright 2017 American Medical Association. All rights reserved. The codes documented in this report are preliminary and upon coder review may  be revised to meet current compliance requirements. Cristopher Estimable. Edrick Whitehorn, MD Norvel Richards, MD 04/18/2018 1:35:00 PM This report has been signed electronically. Number of Addenda: 0

## 2018-04-18 NOTE — ED Triage Notes (Signed)
Pt c/o feeling of food being stuck in his esophagus 2 days ago. Pt reports he was seen here yesterday and was given medication to help it pass and he felt like it went down but when he went home he felt the pain again but further down his esophagus. Pt reports he is unable to keep food or water down. Pt is able to control oral secretions.

## 2018-04-18 NOTE — Discharge Instructions (Signed)
EGD Discharge instructions Please read the instructions outlined below and refer to this sheet in the next few weeks. These discharge instructions provide you with general information on caring for yourself after you leave the hospital. Your doctor may also give you specific instructions. While your treatment has been planned according to the most current medical practices available, unavoidable complications occasionally occur. If you have any problems or questions after discharge, please call your doctor. ACTIVITY  You may resume your regular activity but move at a slower pace for the next 24 hours.   Take frequent rest periods for the next 24 hours.   Walking will help expel (get rid of) the air and reduce the bloated feeling in your abdomen.   No driving for 24 hours (because of the anesthesia (medicine) used during the test).   You may shower.   Do not sign any important legal documents or operate any machinery for 24 hours (because of the anesthesia used during the test).  NUTRITION  Drink plenty of fluids.   You may resume your normal diet.   Begin with a light meal and progress to your normal diet.   Avoid alcoholic beverages for 24 hours or as instructed by your caregiver.  MEDICATIONS  You may resume your normal medications unless your caregiver tells you otherwise.  WHAT YOU CAN EXPECT TODAY  You may experience abdominal discomfort such as a feeling of fullness or gas pains.  FOLLOW-UP  Your doctor will discuss the results of your test with you.  SEEK IMMEDIATE MEDICAL ATTENTION IF ANY OF THE FOLLOWING OCCUR:  Excessive nausea (feeling sick to your stomach) and/or vomiting.   Severe abdominal pain and distention (swelling).   Trouble swallowing.   Temperature over 101 F (37.8 C).   Rectal bleeding or vomiting of blood.   Chew food thoroughly, have liquids on hand to help with swallowing, take 30 minutes to eat a meal-eat slowly.  Wear dentures and make  sure they fit well  Resume pantoprazole 40 mg daily.  Chop meats  You really need to have your esophagus dilated-electively.  This will be scheduled through the office.

## 2018-04-18 NOTE — H&P (Signed)
@LOGO @   Primary Care Physician:  [name], MD Primary Gastroenterologist:  Dr. Oneida Alar  Pre-Procedure History & Physical: HPI:  Mathew Lara is a 57 y.o. male here for difficulty swallowing. Ate steak 2 days ago - felt it stickbehind his breast bone Seen in the ED. Felt it had passed;  Went home. Continue to have difficulties.. Glucagon did not help. Stopped taking his pantoprazole sometime He has a known Schatzki's ring not previously dilated.  Past Medical History:  Diagnosis Date  . Alcohol abuse   . Anxiety   . Bronchitis   . Chronic hip pain   . Chronic kidney disease (CKD) stage G3b/A3, moderately decreased glomerular filtration rate (GFR) between 30-44 mL/min/1.73 square meter and albuminuria creatinine ratio greater than 300 mg/g ([place]) 01/03/2018  . COPD (chronic obstructive pulmonary disease) ([place])   . Hepatitis C   . Hypertension   . Mixed hyperlipidemia   . Renal disorder    states renal dysfunction, "strained kidney"  . Sleep apnea     Past Surgical History:  Procedure Laterality Date  . COLONOSCOPY N/A 01/23/2014   SLF:The LEFT COLON IS EXTREMELY redundant/TWO RECTAL POLYPS REMOVED/SMALL VOLUME RECTAL BLEEDING MOST LIKELY DUE TO Small internal hemorrhoids  . ESOPHAGOGASTRODUODENOSCOPY N/A 01/23/2014   XNT:ZGYFVCBS ring at the gastroesophageal junction/Medium sized hiatal hernia/NDYSPEPSIA MOST LIKELY DUE TO GERD/MILD Non-erosive gastritis  . HEMORRHOID BANDING  01/23/2014   Procedure: HEMORRHOID BANDING;  Surgeon: Danie Binder, MD;  Location: [facility];  Service: Endoscopy;;  . MOUTH SURGERY    . TOTAL HIP ARTHROPLASTY Right 06/04/2016   Procedure: RIGHT TOTAL HIP ARTHROPLASTY ANTERIOR APPROACH;  Surgeon: Mcarthur Rossetti, MD;  Location: [facility] ORS;  Service: Orthopedics;  Laterality: Right;    Prior to Admission medications   Medication Sig Start Date End Date Taking? Authorizing Provider  albuterol (PROVENTIL HFA;VENTOLIN HFA) 108 (90 Base) MCG/ACT  inhaler Inhale 1-2 puffs into the lungs every 6 (six) hours as needed for wheezing or shortness of breath.   Yes [provider]  amLODipine (NORVASC) 10 MG tablet Take 10 mg by mouth daily.   Yes [provider]  aspirin 81 MG chewable tablet Chew 1 tablet (81 mg total) by mouth 2 (two) times daily. 06/06/16  Yes Mcarthur Rossetti, MD  HYDROcodone-acetaminophen (NORCO/VICODIN) 5-325 MG tablet Take 1 tablet by mouth every 6 (six) hours as needed for moderate pain. 03/27/18  Yes Kathie Dike, MD  pantoprazole (PROTONIX) 20 MG tablet Take 1 tablet (20 mg total) by mouth daily. 04/17/18  Yes IdolAlmyra Free, PA-C  SYMBICORT 160-4.5 MCG/ACT inhaler Inhale two puffs twice daily 11/22/17  Yes [provider]    Allergies as of 04/18/2018  . (No Known Allergies)    Family History  Problem Relation Age of Onset  . Cancer Mother     Social History   Socioeconomic History  . Marital status: Married    Spouse name: Not on file  . Number of children: 1  . Years of education: 62  . Highest education level: Not on file  Occupational History    Comment: [employer]  Social Needs  . Financial resource strain: Not on file  . Food insecurity:    Worry: Not on file    Inability: Not on file  . Transportation needs:    Medical: Not on file    Non-medical: Not on file  Tobacco Use  . Smoking status: Former Smoker    Types: Cigarettes    Last attempt to  quit: 05/28/2014    Years since quitting: 3.8  . Smokeless tobacco: Never Used  . Tobacco comment: quit in November 2015 after smoking x 20 yrs.  Substance and Sexual Activity  . Alcohol use: No    Alcohol/week: 0.0 standard drinks  . Drug use: No  . Sexual activity: Not on file  Lifestyle  . Physical activity:    Days per week: Not on file    Minutes per session: Not on file  . Stress: Not on file  Relationships  . Social connections:    Talks on phone: Not on file    Gets together: Not on file    Attends  religious service: Not on file    Active member of club or organization: Not on file    Attends meetings of clubs or organizations: Not on file    Relationship status: Not on file  . Intimate partner violence:    Fear of current or ex partner: Not on file    Emotionally abused: Not on file    Physically abused: Not on file    Forced sexual activity: Not on file  Other Topics Concern  . Not on file  Social History Narrative       patient consumes 4 cups of caffeine daily.    Review of Systems: See HPI, otherwise negative ROS  Physical Exam: BP (!) 136/104   Pulse 90   Temp 98.1 F (36.7 C) (Oral)   Resp 14   Ht 5\' 8"  (1.727 m)   Wt 116.1 kg   SpO2 96%   BMI 38.92 kg/m  General:   Alert,  Well-developed, well-nourished, pleasant and cooperative in NAD Neck:  Supple; no masses or thyromegaly. No significant cervical adenopathy. Lungs:  Clear throughout to auscultation.   No wheezes, crackles, or rhonchi. No acute distress. Heart:  Regular rate and rhythm; no murmurs, clicks, rubs,  or gallops. Abdomen: Non-distended, normal bowel sounds.  Soft and nontender without appreciable mass or hepatosplenomegaly.  Pulses:  Normal pulses noted. Extremities:  Without clubbing or edema.  Impression/Plan: 57 year old gentleman with signs/symptoms of an esophageal food impaction. History of a Schatzki's ring.  Stopped his PPI on his own.  I have offered the patient and urgent EGD;  If food in upper GI tract or significant inflammation, dilation  Would have to wait until  an elective setting.Notice: This dictation was prepared with Dragon dictation along with smaller phrase technology. Any transcriptional errors that result from this process are unintentional and may not be corrected upon review.

## 2018-04-18 NOTE — ED Notes (Signed)
Pt taken to endo by ed tech

## 2018-04-18 NOTE — ED Provider Notes (Signed)
[facility] EMERGENCY DEPARTMENT Provider Note   CSN: [id number] Arrival date & time: 04/18/18  1505     History   Chief Complaint Chief Complaint  Patient presents with  . Food Impaction    HPI Mathew Lara is a 57 y.o. male.  Patient was eating steak for dinner on Sunday night around 5 PM.  When food got stuck.  From that point forward he really was unable to keep liquids or food down.  Seen yesterday morning treated with glucagon and then was able to drink some liquids.  However when he got home food was still getting stuck and then liquids were getting stuck as well but he did not return until this morning.  Patient works night shift.  And he even can even get Jell-O to go down however his saliva will go down pretty well.  Patient had similar problem back in 2015 had an upper endoscopy done by Dr. Oneida Alar.  No problems since.  Patient has some discomfort in his chest about mid chest area.  Patient not on blood thinners.  Reportedly does take an aspirin a day.     Past Medical History:  Diagnosis Date  . Alcohol abuse   . Anxiety   . Bronchitis   . Chronic hip pain   . Chronic kidney disease (CKD) stage G3b/A3, moderately decreased glomerular filtration rate (GFR) between 30-44 mL/min/1.73 square meter and albuminuria creatinine ratio greater than 300 mg/g ([place]) 01/03/2018  . COPD (chronic obstructive pulmonary disease) ([place])   . Hepatitis C   . Hypertension   . Mixed hyperlipidemia   . Renal disorder    states renal dysfunction, "strained kidney"  . Sleep apnea     Patient Active Problem List   Diagnosis Date Noted  . Acute kidney injury ([place]) 03/26/2018  . Rash 03/26/2018  . Chest pain 03/08/2018  . CKD (chronic kidney disease), stage III ([place]) 03/08/2018  . COPD (chronic obstructive pulmonary disease) ([place]) 03/08/2018  . Avascular necrosis of bone of left hip ([place]) 07/27/2017  . Avascular necrosis of bone of right hip ([place]) 06/04/2016  . Status post total  replacement of right hip 06/04/2016  . Hepatitis C 03/21/2015  . History of alcohol abuse 11/08/2013  . Tobacco abuse 11/08/2013  . Essential hypertension, benign 11/07/2013  . Abnormal ECG 11/07/2013    Past Surgical History:  Procedure Laterality Date  . COLONOSCOPY N/A 01/23/2014   SLF:The LEFT COLON IS EXTREMELY redundant/TWO RECTAL POLYPS REMOVED/SMALL VOLUME RECTAL BLEEDING MOST LIKELY DUE TO Small internal hemorrhoids  . ESOPHAGOGASTRODUODENOSCOPY N/A 01/23/2014   WPV:XYIAXKPV ring at the gastroesophageal junction/Medium sized hiatal hernia/NDYSPEPSIA MOST LIKELY DUE TO GERD/MILD Non-erosive gastritis  . HEMORRHOID BANDING  01/23/2014   Procedure: HEMORRHOID BANDING;  Surgeon: Danie Binder, MD;  Location[facility];  Service: Endoscopy;;  . MOUTH SURGERY    . TOTAL HIP ARTHROPLASTY Right 06/04/2016   Procedure: RIGHT TOTAL HIP ARTHROPLASTY ANTERIOR APPROACH;  Surgeon: Mcarthur Rossetti, MD;  Location: [facility];  Service: Orthopedics;  Laterality: Right;        Home Medications    Prior to Admission medications   Medication Sig Start Date End Date Taking? Authorizing Provider  albuterol (PROVENTIL HFA;VENTOLIN HFA) 108 (90 Base) MCG/ACT inhaler Inhale 1-2 puffs into the lungs every 6 (six) hours as needed for wheezing or shortness of breath.    [provider]  amLODipine (NORVASC) 10 MG tablet Take 10 mg by mouth daily.    [provider]  aspirin 81 MG chewable tablet Chew 1 tablet (81 mg total) by mouth 2 (two) times daily. 06/06/16   Mcarthur Rossetti, MD  HYDROcodone-acetaminophen (NORCO/VICODIN) 5-325 MG tablet Take 1 tablet by mouth every 6 (six) hours as needed for moderate pain. 03/27/18   Kathie Dike, MD  pantoprazole (PROTONIX) 20 MG tablet Take 1 tablet (20 mg total) by mouth daily. 04/17/18   Evalee Jefferson, PA-C  SYMBICORT 160-4.5 MCG/ACT inhaler Inhale two puffs twice daily 11/22/17   [provider]    Family History Family  History  Problem Relation Age of Onset  . Cancer Mother     Social History Social History   Tobacco Use  . Smoking status: Former Smoker    Types: Cigarettes    Last attempt to quit: 05/28/2014    Years since quitting: 3.8  . Smokeless tobacco: Never Used  . Tobacco comment: quit in November 2015 after smoking x 20 yrs.  Substance Use Topics  . Alcohol use: No    Alcohol/week: 0.0 standard drinks  . Drug use: No     Allergies   Patient has no known allergies.   Review of Systems Review of Systems  Constitutional: Negative for fever.  HENT: Positive for trouble swallowing. Negative for congestion.   Eyes: Negative for redness.  Respiratory: Negative for shortness of breath.   Cardiovascular: Positive for chest pain.  Gastrointestinal: Negative for abdominal pain and blood in stool.  Genitourinary: Negative for dysuria.  Musculoskeletal: Negative for back pain.  Skin: Negative for rash.  Neurological: Negative for syncope.  Hematological: Does not bruise/bleed easily.  Psychiatric/Behavioral: Negative for confusion.     Physical Exam Updated Vital Signs BP (!) 133/94   Pulse 92   Temp 98.1 F (36.7 C) (Oral)   Resp 16   Ht 1.727 m (5\' 8" )   Wt 116.1 kg   SpO2 94%   BMI 38.92 kg/m   Physical Exam  Constitutional: He is oriented to person, place, and time. He appears well-developed and well-nourished. No distress.  HENT:  Head: Normocephalic and atraumatic.  Oropharynx is clear mucous membranes slightly dry  Eyes: Pupils are equal, round, and reactive to light. Conjunctivae and EOM are normal.  Neck: Neck supple.  Cardiovascular: Normal rate, regular rhythm and normal heart sounds.  Pulmonary/Chest: Effort normal and breath sounds normal. No respiratory distress.  Abdominal: Soft. Bowel sounds are normal. There is no tenderness.  Musculoskeletal: Normal range of motion.  Neurological: He is alert and oriented to person, place, and time. No cranial nerve  deficit or sensory deficit. He exhibits normal muscle tone. Coordination normal.  Nursing note and vitals reviewed.    ED Treatments / Results  Labs (all labs ordered are listed, but only abnormal results are displayed) Labs Reviewed  BASIC METABOLIC PANEL - Abnormal; Notable for the following components:      Result Value   BUN 24 (*)    Creatinine, Ser 2.04 (*)    GFR calc non Af Amer 34 (*)    GFR calc Af Amer 40 (*)    All other components within normal limits    EKG EKG Interpretation  Date/Time:  Tuesday April 18 2018 09:10:04 EDT Ventricular Rate:  85 PR Interval:    QRS Duration: 90 QT Interval:  356 QTC Calculation: 424 R Axis:   57 Text Interpretation:  Sinus rhythm Minimal ST elevation, anterior leads Confirmed by Fredia Sorrow [phone]) on 04/18/2018 9:15:56 AM   Radiology Dg Chest 2 View  Result Date: 04/18/2018 CLINICAL DATA:  Possible food impaction EXAM: CHEST - 2 VIEW COMPARISON:  03/26/2018 FINDINGS: Linear areas of scarring in the lungs bilaterally. Heart and mediastinal contours are within normal limits. No focal opacities or effusions. No acute bony abnormality. IMPRESSION: No active cardiopulmonary disease. Electronically Signed   By: Rolm Baptise M.D.   On: 04/18/2018 09:47    Procedures Procedures (including critical care time)  Medications Ordered in ED Medications  sodium chloride 0.9 % bolus 1,000 mL (1,000 mLs Intravenous New Bag/Given 04/18/18 0914)  0.9 %  sodium chloride infusion ( Intravenous Hold 04/18/18 0914)     Initial Impression / Assessment and Plan / ED Course  I have reviewed the triage vital signs and the nursing notes.  Pertinent labs & imaging results that were available during my care of the patient were reviewed by me and considered in my medical decision making (see chart for details).     Chest x-ray negative EKG without significant changes compared to EKG in July.  Electrolytes were repeated because yesterday he had  a creatinine of 1.57, and since he has not been eating or drinking much was concerned it may have gone higher it is higher today at 2.04.  Patient receiving IV hydration.  Discussed with on-call GI Dr. Gala Romney who will make arrangements to do an upper endoscopy.  Concern is for food impaction.  Final Clinical Impressions(s) / ED Diagnoses   Final diagnoses:  Food impaction of esophagus, initial encounter    ED Discharge Orders    None       Fredia Sorrow, MD 04/18/18 1009

## 2018-04-21 ENCOUNTER — Encounter (HOSPITAL_COMMUNITY): Payer: Self-pay | Admitting: Internal Medicine

## 2018-04-28 DIAGNOSIS — Z79899 Other long term (current) drug therapy: Secondary | ICD-10-CM | POA: Diagnosis not present

## 2018-04-28 DIAGNOSIS — E559 Vitamin D deficiency, unspecified: Secondary | ICD-10-CM | POA: Diagnosis not present

## 2018-04-28 DIAGNOSIS — D509 Iron deficiency anemia, unspecified: Secondary | ICD-10-CM | POA: Diagnosis not present

## 2018-04-28 DIAGNOSIS — N183 Chronic kidney disease, stage 3 (moderate): Secondary | ICD-10-CM | POA: Diagnosis not present

## 2018-04-28 DIAGNOSIS — I10 Essential (primary) hypertension: Secondary | ICD-10-CM | POA: Diagnosis not present

## 2018-04-28 DIAGNOSIS — R809 Proteinuria, unspecified: Secondary | ICD-10-CM | POA: Diagnosis not present

## 2018-05-02 DIAGNOSIS — H40033 Anatomical narrow angle, bilateral: Secondary | ICD-10-CM | POA: Diagnosis not present

## 2018-05-02 DIAGNOSIS — H04123 Dry eye syndrome of bilateral lacrimal glands: Secondary | ICD-10-CM | POA: Diagnosis not present

## 2018-05-22 DIAGNOSIS — Z1389 Encounter for screening for other disorder: Secondary | ICD-10-CM | POA: Diagnosis not present

## 2018-05-22 DIAGNOSIS — Z6838 Body mass index (BMI) 38.0-38.9, adult: Secondary | ICD-10-CM | POA: Diagnosis not present

## 2018-05-22 DIAGNOSIS — G894 Chronic pain syndrome: Secondary | ICD-10-CM | POA: Diagnosis not present

## 2018-06-16 DIAGNOSIS — Z6839 Body mass index (BMI) 39.0-39.9, adult: Secondary | ICD-10-CM | POA: Diagnosis not present

## 2018-06-16 DIAGNOSIS — G894 Chronic pain syndrome: Secondary | ICD-10-CM | POA: Diagnosis not present

## 2018-06-16 DIAGNOSIS — Z1389 Encounter for screening for other disorder: Secondary | ICD-10-CM | POA: Diagnosis not present

## 2018-06-16 DIAGNOSIS — Z23 Encounter for immunization: Secondary | ICD-10-CM | POA: Diagnosis not present

## 2018-07-10 DIAGNOSIS — Z6841 Body Mass Index (BMI) 40.0 and over, adult: Secondary | ICD-10-CM | POA: Diagnosis not present

## 2018-07-10 DIAGNOSIS — G894 Chronic pain syndrome: Secondary | ICD-10-CM | POA: Diagnosis not present

## 2018-07-10 DIAGNOSIS — Z1389 Encounter for screening for other disorder: Secondary | ICD-10-CM | POA: Diagnosis not present

## 2018-07-17 ENCOUNTER — Ambulatory Visit (INDEPENDENT_AMBULATORY_CARE_PROVIDER_SITE_OTHER): Payer: Self-pay | Admitting: Orthopaedic Surgery

## 2018-07-26 DIAGNOSIS — Z6841 Body Mass Index (BMI) 40.0 and over, adult: Secondary | ICD-10-CM | POA: Diagnosis not present

## 2018-07-26 DIAGNOSIS — G894 Chronic pain syndrome: Secondary | ICD-10-CM | POA: Diagnosis not present

## 2018-07-26 DIAGNOSIS — Z1389 Encounter for screening for other disorder: Secondary | ICD-10-CM | POA: Diagnosis not present

## 2018-08-14 DIAGNOSIS — J069 Acute upper respiratory infection, unspecified: Secondary | ICD-10-CM | POA: Diagnosis not present

## 2018-08-14 DIAGNOSIS — Z1389 Encounter for screening for other disorder: Secondary | ICD-10-CM | POA: Diagnosis not present

## 2018-08-14 DIAGNOSIS — Z6841 Body Mass Index (BMI) 40.0 and over, adult: Secondary | ICD-10-CM | POA: Diagnosis not present

## 2018-08-14 DIAGNOSIS — G894 Chronic pain syndrome: Secondary | ICD-10-CM | POA: Diagnosis not present

## 2018-09-11 DIAGNOSIS — N183 Chronic kidney disease, stage 3 (moderate): Secondary | ICD-10-CM | POA: Diagnosis not present

## 2018-09-11 DIAGNOSIS — R809 Proteinuria, unspecified: Secondary | ICD-10-CM | POA: Diagnosis not present

## 2018-09-11 DIAGNOSIS — I10 Essential (primary) hypertension: Secondary | ICD-10-CM | POA: Diagnosis not present

## 2018-09-11 DIAGNOSIS — E559 Vitamin D deficiency, unspecified: Secondary | ICD-10-CM | POA: Diagnosis not present

## 2018-09-11 DIAGNOSIS — Z79899 Other long term (current) drug therapy: Secondary | ICD-10-CM | POA: Diagnosis not present

## 2018-09-11 DIAGNOSIS — D509 Iron deficiency anemia, unspecified: Secondary | ICD-10-CM | POA: Diagnosis not present

## 2018-09-13 DIAGNOSIS — I10 Essential (primary) hypertension: Secondary | ICD-10-CM | POA: Diagnosis not present

## 2018-09-13 DIAGNOSIS — R809 Proteinuria, unspecified: Secondary | ICD-10-CM | POA: Diagnosis not present

## 2018-09-13 DIAGNOSIS — N183 Chronic kidney disease, stage 3 (moderate): Secondary | ICD-10-CM | POA: Diagnosis not present

## 2018-09-13 DIAGNOSIS — D649 Anemia, unspecified: Secondary | ICD-10-CM | POA: Diagnosis not present

## 2018-09-15 DIAGNOSIS — Z6841 Body Mass Index (BMI) 40.0 and over, adult: Secondary | ICD-10-CM | POA: Diagnosis not present

## 2018-09-15 DIAGNOSIS — G894 Chronic pain syndrome: Secondary | ICD-10-CM | POA: Diagnosis not present

## 2018-10-16 DIAGNOSIS — J069 Acute upper respiratory infection, unspecified: Secondary | ICD-10-CM | POA: Diagnosis not present

## 2018-10-16 DIAGNOSIS — G894 Chronic pain syndrome: Secondary | ICD-10-CM | POA: Diagnosis not present

## 2018-10-16 DIAGNOSIS — Z1389 Encounter for screening for other disorder: Secondary | ICD-10-CM | POA: Diagnosis not present

## 2018-10-16 DIAGNOSIS — Z6841 Body Mass Index (BMI) 40.0 and over, adult: Secondary | ICD-10-CM | POA: Diagnosis not present

## 2018-11-13 DIAGNOSIS — Z6839 Body mass index (BMI) 39.0-39.9, adult: Secondary | ICD-10-CM | POA: Diagnosis not present

## 2018-11-13 DIAGNOSIS — G894 Chronic pain syndrome: Secondary | ICD-10-CM | POA: Diagnosis not present

## 2018-11-13 DIAGNOSIS — Z1389 Encounter for screening for other disorder: Secondary | ICD-10-CM | POA: Diagnosis not present

## 2018-11-21 ENCOUNTER — Ambulatory Visit (INDEPENDENT_AMBULATORY_CARE_PROVIDER_SITE_OTHER): Payer: BLUE CROSS/BLUE SHIELD

## 2018-11-21 ENCOUNTER — Other Ambulatory Visit: Payer: Self-pay

## 2018-11-21 ENCOUNTER — Ambulatory Visit (INDEPENDENT_AMBULATORY_CARE_PROVIDER_SITE_OTHER): Payer: BLUE CROSS/BLUE SHIELD | Admitting: Physician Assistant

## 2018-11-21 VITALS — Ht 68.0 in | Wt 265.0 lb

## 2018-11-21 DIAGNOSIS — M25552 Pain in left hip: Secondary | ICD-10-CM

## 2018-11-21 NOTE — Progress Notes (Signed)
Office Visit Note   Patient: Mathew Lara           Date of Birth: [birth year]           MRN: [id number] Visit Date: 11/21/2018              Requested by: [name], [place], [place] [address] PCP: [name], MD   Assessment & Plan: Visit Diagnoses:  1. Pain in left hip     Plan: We will have him gain medical clearance for surgery.  We will have him contact Samella Parr once he is getting clearance so that he can be set up for left total hip arthroplasty.  Sherry's contact information was given to the patient today.  He understands risk benefits of surgery and the postoperative protocol.  Questions were encouraged and answered at length.  We will see him back 2 weeks postop.  Follow-Up Instructions: No follow-ups on file.   Orders:  Orders Placed This Encounter  Procedures  . XR HIP UNILAT W OR W/O PELVIS 1V LEFT   No orders of the defined types were placed in this encounter.     Procedures: No procedures performed   Clinical Data: No additional findings.   Subjective: Chief Complaint  Patient presents with  . Left Hip - Pain, Follow-up    HPI Mathew Lara is well-known to Dr. Ninfa Linden service.  He underwent a right total hip arthroplasty in September 2017.  He has severe pain involving his left hip.  Comes in today wanting to discuss setting up surgery.  He is ambulating with a cane due to the hip pain on the left.  Right hip is doing well.  Patient's past medical history includes hypertension, kidney disease, COPD, and sleep apnea.  He was last seen in July 2019 he was told he would need medical clearance to set up his left total hip arthroplasty.  Has had no new injury to the left hip. Review of Systems Please see HPI  Objective: Vital Signs: Ht 5\' 8"  (1.727 m)   Wt 265 lb (120.2 kg)   BMI 40.29 kg/m   Physical Exam Constitutional:      Appearance: He is not ill-appearing or diaphoretic.  Pulmonary:     Effort: Pulmonary  effort is normal.  Neurological:     Mental Status: He is alert and oriented to person, place, and time.     Ortho Exam Left hip he has pain with internal and external rotation and decreased motion.  He ambulates with a cane with an antalgic gait. Specialty Comments:  No specialty comments available.  Imaging: Xr Hip Unilat W Or W/o Pelvis 1v Left  Result Date: 11/21/2018 [facility] pelvis and lateral view of the left hip: No acute fractures.  Right total hip arthroplasty well-seated components without any signs of complication.  Left hip femoral head is misshapen withsignificant loss of the joint space.    PMFS History: Patient Active Problem List   Diagnosis Date Noted  . Acute kidney injury ([place]) 03/26/2018  . Rash 03/26/2018  . Chest pain 03/08/2018  . CKD (chronic kidney disease), stage III ([place]) 03/08/2018  . COPD (chronic obstructive pulmonary disease) ([place]) 03/08/2018  . Avascular necrosis of bone of left hip ([place]) 07/27/2017  . Avascular necrosis of bone of right hip ([place]) 06/04/2016  . Status post total replacement of right hip 06/04/2016  . Hepatitis C 03/21/2015  . History of alcohol abuse 11/08/2013  . Tobacco abuse 11/08/2013  .  Essential hypertension, benign 11/07/2013  . Abnormal ECG 11/07/2013   Past Medical History:  Diagnosis Date  . Alcohol abuse   . Anxiety   . Bronchitis   . Chronic hip pain   . Chronic kidney disease (CKD) stage G3b/A3, moderately decreased glomerular filtration rate (GFR) between 30-44 mL/min/1.73 square meter and albuminuria creatinine ratio greater than 300 mg/g () 01/03/2018  . COPD (chronic obstructive pulmonary disease) ([place])   . Hepatitis C   . Hypertension   . Mixed hyperlipidemia   . Renal disorder    states renal dysfunction, "strained kidney"  . Sleep apnea     Family History  Problem Relation Age of Onset  . Cancer Mother     Past Surgical History:  Procedure Laterality Date  . COLONOSCOPY N/A 01/23/2014   SLF:The  LEFT COLON IS EXTREMELY redundant/TWO RECTAL POLYPS REMOVED/SMALL VOLUME RECTAL BLEEDING MOST LIKELY DUE TO Small internal hemorrhoids  . ESOPHAGOGASTRODUODENOSCOPY N/A 01/23/2014   XNA:TFTDDUKG ring at the gastroesophageal junction/Medium sized hiatal hernia/NDYSPEPSIA MOST LIKELY DUE TO GERD/MILD Non-erosive gastritis  . ESOPHAGOGASTRODUODENOSCOPY N/A 04/18/2018   Procedure: ESOPHAGOGASTRODUODENOSCOPY (EGD);  Surgeon: Daneil Dolin, MD;  [facility];  Service: Endoscopy;  Laterality: N/A;  . HEMORRHOID BANDING  01/23/2014   Procedure: HEMORRHOID BANDING;  Surgeon: Danie Binder, MD;  Location[facility];  Service: Endoscopy;;  . MOUTH SURGERY    . TOTAL HIP ARTHROPLASTY Right 06/04/2016   Procedure: RIGHT TOTAL HIP ARTHROPLASTY ANTERIOR APPROACH;  Surgeon: Mcarthur Rossetti, MD;  [facility];  Service: Orthopedics;  Laterality: Right;   Social History   Occupational History    Comment: [employer]  Tobacco Use  . Smoking status: Former Smoker    Types: Cigarettes    Last attempt to quit: 05/28/2014    Years since quitting: 4.4  . Smokeless tobacco: Never Used  . Tobacco comment: quit in November 2015 after smoking x 20 yrs.  Substance and Sexual Activity  . Alcohol use: No    Alcohol/week: 0.0 standard drinks  . Drug use: No  . Sexual activity: Not on file

## 2018-11-29 DIAGNOSIS — G894 Chronic pain syndrome: Secondary | ICD-10-CM | POA: Diagnosis not present

## 2018-11-29 DIAGNOSIS — G4730 Sleep apnea, unspecified: Secondary | ICD-10-CM | POA: Diagnosis not present

## 2018-11-29 DIAGNOSIS — Z6841 Body Mass Index (BMI) 40.0 and over, adult: Secondary | ICD-10-CM | POA: Diagnosis not present

## 2018-11-29 DIAGNOSIS — I10 Essential (primary) hypertension: Secondary | ICD-10-CM | POA: Diagnosis not present

## 2018-11-29 DIAGNOSIS — N183 Chronic kidney disease, stage 3 (moderate): Secondary | ICD-10-CM | POA: Diagnosis not present

## 2018-11-29 DIAGNOSIS — R748 Abnormal levels of other serum enzymes: Secondary | ICD-10-CM | POA: Diagnosis not present

## 2018-11-29 DIAGNOSIS — E7849 Other hyperlipidemia: Secondary | ICD-10-CM | POA: Diagnosis not present

## 2018-11-29 DIAGNOSIS — R7309 Other abnormal glucose: Secondary | ICD-10-CM | POA: Diagnosis not present

## 2018-11-29 DIAGNOSIS — Z01810 Encounter for preprocedural cardiovascular examination: Secondary | ICD-10-CM | POA: Diagnosis not present

## 2018-11-29 DIAGNOSIS — G4733 Obstructive sleep apnea (adult) (pediatric): Secondary | ICD-10-CM | POA: Diagnosis not present

## 2019-01-04 DIAGNOSIS — G894 Chronic pain syndrome: Secondary | ICD-10-CM | POA: Diagnosis not present

## 2019-01-04 DIAGNOSIS — Z1389 Encounter for screening for other disorder: Secondary | ICD-10-CM | POA: Diagnosis not present

## 2019-01-04 DIAGNOSIS — E119 Type 2 diabetes mellitus without complications: Secondary | ICD-10-CM | POA: Diagnosis not present

## 2019-01-04 DIAGNOSIS — J449 Chronic obstructive pulmonary disease, unspecified: Secondary | ICD-10-CM | POA: Diagnosis not present

## 2019-01-04 DIAGNOSIS — Z6841 Body Mass Index (BMI) 40.0 and over, adult: Secondary | ICD-10-CM | POA: Diagnosis not present

## 2019-02-01 DIAGNOSIS — G894 Chronic pain syndrome: Secondary | ICD-10-CM | POA: Diagnosis not present

## 2019-02-01 DIAGNOSIS — Z6841 Body Mass Index (BMI) 40.0 and over, adult: Secondary | ICD-10-CM | POA: Diagnosis not present

## 2019-02-12 ENCOUNTER — Other Ambulatory Visit: Payer: Self-pay | Admitting: Physician Assistant

## 2019-02-22 ENCOUNTER — Encounter (HOSPITAL_COMMUNITY): Payer: Self-pay

## 2019-02-22 NOTE — Patient Instructions (Addendum)
Mathew Lara    Your procedure is scheduled on: 03-02-2019   Report to [facility] Main  Entrance Report to admitting at 7:05 AM   YOU NEED TO HAVE A COVID 19 TEST ON_6/23______ @__9 :00_____, THIS TEST MUST BE DONE BEFORE SURGERY, COME TO [place]. ONCE YOUR COVID TEST IS COMPLETED, PLEASE BEGIN THE QUARANTINE INSTRUCTIONS AS OUTLINED IN YOUR HANDOUT.   Call this number if you have problems the morning of surgery [phone]        Remember: [place], NO CHEWING GUM CANDY OR MINTS.   NO SOLID FOOD AFTER MIDNIGHT THE NIGHT PRIOR TO SURGERY. NOTHING BY MOUTH EXCEPT CLEAR LIQUIDS UNTIL 635 AM. . PLEASE FINISH ENSURE DRINK PER SURGEON ORDER 3 HOURS PRIOR TO SCHEDULED SURGERY TIME WHICH NEEDS TO BE COMPLETED AT 40 AM.  CLEAR LIQUID DIET   Foods Allowed                                                                     Foods Excluded  Coffee and tea, regular and decaf                             liquids that you cannot  Plain Jell-O in any flavor                                             see through such as: Fruit ices (not with fruit pulp)                                     milk, soups, orange juice  Iced Popsicles                                    All solid food Carbonated beverages, regular and diet                                    Cranberry, grape and apple juices Sports drinks like Gatorade Lightly seasoned clear broth or consume(fat free) Sugar, honey syrup  Sample Menu Breakfast                                Lunch                                     Supper Cranberry juice                    Beef broth  Chicken broth Jell-O                                     Grape juice                           Apple juice Coffee or tea                        Jell-O                                      Popsicle                                                 Coffee or tea                        Coffee or tea  _____________________________________________________________________     Take these medicines the morning of surgery with A SIP OF WATER: PROAIR INHALER IF NEEDED AND BRING INHALER, LORATADINE (CLARITIN), AMLODIPINE (NORVASC)                               You may not have any metal on your body including hair pins and                                                         Bring mask and tubing to hospital             piercings  Do not wear jewelry, make-up, lotions, powders or perfumes, deodorant                         Men may shave face and neck.   Do not bring valuables to the hospital. [place].  Contacts, dentures or bridgework may not be worn into surgery.  Leave suitcase in the car. After surgery it may be brought to your room.      _____________________________________________________________________             [facility] - Preparing for Surgery Before surgery, you can play an important role.  Because skin is not sterile, your skin needs to be as free of germs as possible.  You can reduce the number of germs on your skin by washing with CHG (chlorahexidine gluconate) soap before surgery.  CHG is an antiseptic cleaner which kills germs and bonds with the skin to continue killing germs even after washing. Please DO NOT use if you have an allergy to CHG or antibacterial soaps.  If your skin becomes reddened/irritated stop using the CHG and inform your nurse when you arrive at Short Stay. Do not shave (including legs and underarms) for at least 48 hours prior to the first CHG shower.  You may  shave your face/neck. Please follow these instructions carefully:  1.  Shower with CHG Soap the night before surgery and the  morning of Surgery.  2.  If you choose to wash your hair, wash your hair first as usual with your  normal  shampoo.  3.  After you shampoo, rinse your  hair and body thoroughly to remove the  shampoo.                           4.  Use CHG as you would any other liquid soap.  You can apply chg directly  to the skin and wash                       Gently with a scrungie or clean washcloth.  5.  Apply the CHG Soap to your body ONLY FROM THE NECK DOWN.   Do not use on face/ open                           Wound or open sores. Avoid contact with eyes, ears mouth and genitals (private parts).                       Wash face,  Genitals (private parts) with your normal soap.             6.  Wash thoroughly, paying special attention to the area where your surgery  will be performed.  7.  Thoroughly rinse your body with warm water from the neck down.  8.  DO NOT shower/wash with your normal soap after using and rinsing off  the CHG Soap.                9.  Pat yourself dry with a clean towel.            10.  Wear clean pajamas.            11.  Place clean sheets on your bed the night of your first shower and do not  sleep with pets. Day of Surgery : Do not apply any lotions/deodorants the morning of surgery.  Please wear clean clothes to the hospital/surgery center.  FAILURE TO FOLLOW THESE INSTRUCTIONS MAY RESULT IN THE CANCELLATION OF YOUR SURGERY PATIENT SIGNATURE_________________________________  NURSE SIGNATURE__________________________________  ________________________________________________________________________   Mathew Lara  An incentive spirometer is a tool that can help keep your lungs clear and active. This tool measures how well you are filling your lungs with each breath. Taking long deep breaths may help reverse or decrease the chance of developing breathing (pulmonary) problems (especially infection) following:  A long period of time when you are unable to move or be active. BEFORE THE PROCEDURE   If the spirometer includes an indicator to show your best effort, your nurse or respiratory therapist will set it to a  desired goal.  If possible, sit up straight or lean slightly forward. Try not to slouch.  Hold the incentive spirometer in an upright position. INSTRUCTIONS FOR USE  1. Sit on the edge of your bed if possible, or sit up as far as you can in bed or on a chair. 2. Hold the incentive spirometer in an upright position. 3. Breathe out normally. 4. Place the mouthpiece in your mouth and seal your lips tightly around it. 5. Breathe in slowly and as deeply as possible, raising the  piston or the ball toward the top of the column. 6. Hold your breath for 3-5 seconds or for as long as possible. Allow the piston or ball to fall to the bottom of the column. 7. Remove the mouthpiece from your mouth and breathe out normally. 8. Rest for a few seconds and repeat Steps 1 through 7 at least 10 times every 1-2 hours when you are awake. Take your time and take a few normal breaths between deep breaths. 9. The spirometer may include an indicator to show your best effort. Use the indicator as a goal to work toward during each repetition. 10. After each set of 10 deep breaths, practice coughing to be sure your lungs are clear. If you have an incision (the cut made at the time of surgery), support your incision when coughing by placing a pillow or rolled up towels firmly against it. Once you are able to get out of bed, walk around indoors and cough well. You may stop using the incentive spirometer when instructed by your caregiver.  RISKS AND COMPLICATIONS  Take your time so you do not get dizzy or light-headed.  If you are in pain, you may need to take or ask for pain medication before doing incentive spirometry. It is harder to take a deep breath if you are having pain. AFTER USE  Rest and breathe slowly and easily.  It can be helpful to keep track of a log of your progress. Your caregiver can provide you with a simple table to help with this. If you are using the spirometer at home, follow these  instructions: [place] IF:   You are having difficultly using the spirometer.  You have trouble using the spirometer as often as instructed.  Your pain medication is not giving enough relief while using the spirometer.  You develop fever of 100.5 F (38.1 C) or higher. SEEK IMMEDIATE MEDICAL CARE IF:   You cough up bloody sputum that had not been present before.  You develop fever of 102 F (38.9 C) or greater.  You develop worsening pain at or near the incision site. MAKE SURE YOU:   Understand these instructions.  Will watch your condition.  Will get help right away if you are not doing well or get worse. Document Released: 01/03/2007 Document Revised: 11/15/2011 Document Reviewed: 03/06/2007 [facility] Patient Information 2014 [place], [place].   ________________________________________________________________________

## 2019-02-22 NOTE — Progress Notes (Signed)
MEDICAL AND CARDIAC CLEARANCE NOTE DR Hilma Favors ON CHART RENAL CLEARANCE NOTE DR [facility] ON CHART CHEST XRAY 04-18-18 Epic EKG 04-18-18 EPIC

## 2019-02-23 ENCOUNTER — Encounter (HOSPITAL_COMMUNITY): Payer: Self-pay

## 2019-02-23 ENCOUNTER — Encounter (HOSPITAL_COMMUNITY)
Admission: RE | Admit: 2019-02-23 | Discharge: 2019-02-23 | Disposition: A | Discharge status: Home / Self Care | Payer: BC Managed Care – PPO | Source: Ambulatory Visit | Attending: Orthopaedic Surgery | Admitting: Orthopaedic Surgery

## 2019-02-23 ENCOUNTER — Other Ambulatory Visit: Payer: Self-pay

## 2019-02-23 DIAGNOSIS — Z8619 Personal history of other infectious and parasitic diseases: Secondary | ICD-10-CM | POA: Insufficient documentation

## 2019-02-23 DIAGNOSIS — M1612 Unilateral primary osteoarthritis, left hip: Secondary | ICD-10-CM | POA: Diagnosis not present

## 2019-02-23 DIAGNOSIS — Z7982 Long term (current) use of aspirin: Secondary | ICD-10-CM | POA: Diagnosis not present

## 2019-02-23 DIAGNOSIS — Z96641 Presence of right artificial hip joint: Secondary | ICD-10-CM | POA: Insufficient documentation

## 2019-02-23 DIAGNOSIS — Z87891 Personal history of nicotine dependence: Secondary | ICD-10-CM | POA: Insufficient documentation

## 2019-02-23 DIAGNOSIS — Z01818 Encounter for other preprocedural examination: Secondary | ICD-10-CM | POA: Insufficient documentation

## 2019-02-23 DIAGNOSIS — G4730 Sleep apnea, unspecified: Secondary | ICD-10-CM | POA: Diagnosis not present

## 2019-02-23 DIAGNOSIS — J449 Chronic obstructive pulmonary disease, unspecified: Secondary | ICD-10-CM | POA: Diagnosis not present

## 2019-02-23 DIAGNOSIS — I129 Hypertensive chronic kidney disease with stage 1 through stage 4 chronic kidney disease, or unspecified chronic kidney disease: Secondary | ICD-10-CM | POA: Insufficient documentation

## 2019-02-23 DIAGNOSIS — N189 Chronic kidney disease, unspecified: Secondary | ICD-10-CM | POA: Diagnosis not present

## 2019-02-23 DIAGNOSIS — E782 Mixed hyperlipidemia: Secondary | ICD-10-CM | POA: Diagnosis not present

## 2019-02-23 DIAGNOSIS — Z79899 Other long term (current) drug therapy: Secondary | ICD-10-CM | POA: Insufficient documentation

## 2019-02-23 DIAGNOSIS — F419 Anxiety disorder, unspecified: Secondary | ICD-10-CM | POA: Insufficient documentation

## 2019-02-23 LAB — CBC
HCT: 40.9 % (ref 39.0–52.0)
Hemoglobin: 12.6 g/dL — ABNORMAL LOW (ref 13.0–17.0)
MCH: 27.2 pg (ref 26.0–34.0)
MCHC: 30.8 g/dL (ref 30.0–36.0)
MCV: 88.3 fL (ref 80.0–100.0)
Platelets: 298 10*3/uL (ref 150–400)
RBC: 4.63 MIL/uL (ref 4.22–5.81)
RDW: 16 % — ABNORMAL HIGH (ref 11.5–15.5)
WBC: 8.2 10*3/uL (ref 4.0–10.5)
nRBC: 0 % (ref 0.0–0.2)

## 2019-02-23 LAB — COMPREHENSIVE METABOLIC PANEL
ALT: 31 U/L (ref 0–44)
AST: 30 U/L (ref 15–41)
Albumin: 4.3 g/dL (ref 3.5–5.0)
Alkaline Phosphatase: 93 U/L (ref 38–126)
Anion gap: 9 (ref 5–15)
BUN: 26 mg/dL — ABNORMAL HIGH (ref 6–20)
CO2: 23 mmol/L (ref 22–32)
Calcium: 9.7 mg/dL (ref 8.9–10.3)
Chloride: 104 mmol/L (ref 98–111)
Creatinine, Ser: 1.72 mg/dL — ABNORMAL HIGH (ref 0.61–1.24)
GFR calc Af Amer: 49 mL/min — ABNORMAL LOW (ref >60)
GFR calc non Af Amer: 43 mL/min — ABNORMAL LOW (ref >60)
Glucose, Bld: 89 mg/dL (ref 70–99)
Potassium: 4.3 mmol/L (ref 3.5–5.1)
Sodium: 136 mmol/L (ref 135–145)
Total Bilirubin: 0.5 mg/dL (ref 0.3–1.2)
Total Protein: 9.1 g/dL — ABNORMAL HIGH (ref 6.5–8.1)

## 2019-02-23 LAB — SURGICAL PCR SCREEN
MRSA, PCR: NEGATIVE
Staphylococcus aureus: NEGATIVE

## 2019-02-26 DIAGNOSIS — I10 Essential (primary) hypertension: Secondary | ICD-10-CM | POA: Diagnosis not present

## 2019-02-26 DIAGNOSIS — Z79899 Other long term (current) drug therapy: Secondary | ICD-10-CM | POA: Diagnosis not present

## 2019-02-26 DIAGNOSIS — D509 Iron deficiency anemia, unspecified: Secondary | ICD-10-CM | POA: Diagnosis not present

## 2019-02-26 DIAGNOSIS — E559 Vitamin D deficiency, unspecified: Secondary | ICD-10-CM | POA: Diagnosis not present

## 2019-02-26 DIAGNOSIS — N183 Chronic kidney disease, stage 3 (moderate): Secondary | ICD-10-CM | POA: Diagnosis not present

## 2019-02-26 DIAGNOSIS — R809 Proteinuria, unspecified: Secondary | ICD-10-CM | POA: Diagnosis not present

## 2019-02-27 ENCOUNTER — Other Ambulatory Visit (HOSPITAL_COMMUNITY)
Admission: RE | Admit: 2019-02-27 | Discharge: 2019-02-27 | Disposition: A | Discharge status: Home / Self Care | Payer: BC Managed Care – PPO | Source: Ambulatory Visit | Attending: Orthopaedic Surgery | Admitting: Orthopaedic Surgery

## 2019-02-27 DIAGNOSIS — Z1159 Encounter for screening for other viral diseases: Secondary | ICD-10-CM | POA: Diagnosis not present

## 2019-02-27 LAB — SARS CORONAVIRUS 2 (TAT 6-24 HRS): SARS Coronavirus 2: NEGATIVE

## 2019-02-27 NOTE — Progress Notes (Signed)
Anesthesia Chart Review   Case: [id number] Date/Time: 03/02/19 1638   Procedure: LEFT TOTAL HIP ARTHROPLASTY ANTERIOR APPROACH (Left )   Anesthesia type: Choice   Pre-op diagnosis: Osteoarthritis Left Hip   Location: [place] [facility] / [facility]   Surgeon: Mcarthur Rossetti, MD      DISCUSSION: 58 yo former smoker (quit 05/28/14) with h/o anxiety, CKD (creatinine 1/72 02/23/2019, stable), sleep apnea, HTN, HLD, Hepatitis C, COPD, left hip OA scheduled for above procedure 03/02/2019 with Dr. Jean Rosenthal.   Clearance received from PCP, Dr. [name], which states patient is cleared for surgery from a medical and cardiac standpoint.  (on chart)   Clearance also received from nephrologist, Dr. Lowanda Foster, which states from a renal standpoint patient is stable.   Pt can proceed with planned procedure barring acute status change.  VS: BP (!) 130/94 (BP Location: Left Arm)   Pulse 82   Temp 36.9 C (Oral)   Resp 18   Ht 5\' 8"  (1.727 m)   Wt 124 kg   SpO2 96%   BMI 41.57 kg/m   PROVIDERS: [name], MD is PCP    LABS: Labs reviewed: Acceptable for surgery. (all labs ordered are listed, but only abnormal results are displayed)  Labs Reviewed  COMPREHENSIVE METABOLIC PANEL - Abnormal; Notable for the following components:      Result Value   BUN 26 (*)    Creatinine, Ser 1.72 (*)    Total Protein 9.1 (*)    GFR calc non Af Amer 43 (*)    GFR calc Af Amer 49 (*)    All other components within normal limits  CBC - Abnormal; Notable for the following components:   Hemoglobin 12.6 (*)    RDW 16.0 (*)    All other components within normal limits  SURGICAL PCR SCREEN     IMAGES:   EKG: 04/18/18 Rate 85 bpm Sinus rhythm  Minimal ST elevation, anterior leads  CV:  Past Medical History:  Diagnosis Date  . Alcohol abuse   . Anxiety   . Bronchitis   . Chronic hip pain   . Chronic kidney disease (CKD) stage G3b/A3, moderately decreased glomerular filtration rate  (GFR) between 30-44 mL/min/1.73 square meter and albuminuria creatinine ratio greater than 300 mg/g ([place]) 01/03/2018  . COPD (chronic obstructive pulmonary disease) ([place])   . Hepatitis C   . Hypertension   . Mixed hyperlipidemia   . Renal disorder    states renal dysfunction, "strained kidney"  . Sleep apnea     Past Surgical History:  Procedure Laterality Date  . COLONOSCOPY N/A 01/23/2014   SLF:The LEFT COLON IS EXTREMELY redundant/TWO RECTAL POLYPS REMOVED/SMALL VOLUME RECTAL BLEEDING MOST LIKELY DUE TO Small internal hemorrhoids  . ESOPHAGOGASTRODUODENOSCOPY N/A 01/23/2014   GTX:MIWOEHOZ ring at the gastroesophageal junction/Medium sized hiatal hernia/NDYSPEPSIA MOST LIKELY DUE TO GERD/MILD Non-erosive gastritis  . ESOPHAGOGASTRODUODENOSCOPY N/A 04/18/2018   Procedure: ESOPHAGOGASTRODUODENOSCOPY (EGD);  Surgeon: Daneil Dolin, MD;  [facility];  Service: Endoscopy;  Laterality: N/A;  . HEMORRHOID BANDING  01/23/2014   Procedure: HEMORRHOID BANDING;  Surgeon: Danie Binder, MD;  [facility];  Service: Endoscopy;;  . JOINT REPLACEMENT Right 2017  . MOUTH SURGERY    . TOTAL HIP ARTHROPLASTY Right 06/04/2016   Procedure: RIGHT TOTAL HIP ARTHROPLASTY ANTERIOR APPROACH;  Surgeon: Mcarthur Rossetti, MD;  [facility];  Service: Orthopedics;  Laterality: Right;    MEDICATIONS: . amLODipine (NORVASC) 10 MG tablet  .  aspirin EC 81 MG tablet  . fenofibrate 160 MG tablet  . HYDROcodone-acetaminophen (NORCO/VICODIN) 5-325 MG tablet  . loratadine (CLARITIN) 10 MG tablet  . PROAIR HFA 108 (90 Base) MCG/ACT inhaler  . Vitamin D, Ergocalciferol, (DRISDOL) 1.25 MG (50000 UT) CAPS capsule   No current facility-administered medications for this encounter.     Maia Plan [facility] Pre-Surgical Testing [phone] 02/27/19 2:09 PM

## 2019-02-27 NOTE — Anesthesia Preprocedure Evaluation (Addendum)
Anesthesia Evaluation  Patient identified by MRN, date of birth, ID band Patient awake    Reviewed: Allergy & Precautions, NPO status , Patient's Chart, lab work & pertinent test results  Airway Mallampati: II  TM Distance: >3 FB Neck ROM: Full    Dental no notable dental hx. (+) Teeth Intact, Caps   Pulmonary sleep apnea , COPD,  COPD inhaler, former smoker,    Pulmonary exam normal breath sounds clear to auscultation       Cardiovascular hypertension, Pt. on medications Normal cardiovascular exam Rhythm:Regular Rate:Normal     Neuro/Psych PSYCHIATRIC DISORDERS Anxiety negative neurological ROS     GI/Hepatic negative GI ROS, (+) Hepatitis -  Endo/Other  Morbid obesityHyperlipidemia  Renal/GU Renal InsufficiencyRenal disease  negative genitourinary   Musculoskeletal AVN left hip   Abdominal (+) + obese,   Peds  Hematology   Anesthesia Other Findings   Reproductive/Obstetrics                            Anesthesia Physical Anesthesia Plan  ASA: III  Anesthesia Plan: General   Post-op Pain Management:    Induction:   PONV Risk Score and Plan: Propofol infusion, Ondansetron, Treatment may vary due to age or medical condition and Midazolam  Airway Management Planned: Oral ETT  Additional Equipment:   Intra-op Plan:   Post-operative Plan: Extubation in OR  Informed Consent: I have reviewed the patients History and Physical, chart, labs and discussed the procedure including the risks, benefits and alternatives for the proposed anesthesia with the patient or authorized representative who has indicated his/her understanding and acceptance.     Dental advisory given  Plan Discussed with: CRNA and Surgeon  Anesthesia Plan Comments: (See PAT note 02/23/2019, Konrad Felix, PA-C  Patient requests GA, does not want SAB. Bryon Lions, MD)      Anesthesia Quick Evaluation

## 2019-03-01 ENCOUNTER — Other Ambulatory Visit: Payer: Self-pay

## 2019-03-01 MED ORDER — DEXTROSE 5 % IV SOLN
3.0000 g | INTRAVENOUS | Status: AC
  Administered 2019-03-02: 3 g via INTRAVENOUS
  Filled 2019-03-01: qty 3

## 2019-03-02 ENCOUNTER — Other Ambulatory Visit: Payer: Self-pay

## 2019-03-02 ENCOUNTER — Encounter (HOSPITAL_COMMUNITY): Payer: Self-pay | Admitting: General Practice

## 2019-03-02 ENCOUNTER — Inpatient Hospital Stay (HOSPITAL_COMMUNITY): Payer: BC Managed Care – PPO

## 2019-03-02 ENCOUNTER — Encounter (HOSPITAL_COMMUNITY)
Admission: RE | Disposition: A | Discharge status: Home Health | Payer: Self-pay | Source: Ambulatory Visit | Attending: Orthopaedic Surgery

## 2019-03-02 ENCOUNTER — Inpatient Hospital Stay (HOSPITAL_COMMUNITY): Payer: BC Managed Care – PPO | Admitting: Physician Assistant

## 2019-03-02 ENCOUNTER — Inpatient Hospital Stay (HOSPITAL_COMMUNITY)
Admission: RE | Admit: 2019-03-02 | Discharge: 2019-03-04 | DRG: 470 | Disposition: A | Discharge status: Home Health | Payer: BC Managed Care – PPO | Source: Ambulatory Visit | Attending: Orthopaedic Surgery | Admitting: Orthopaedic Surgery

## 2019-03-02 ENCOUNTER — Inpatient Hospital Stay (HOSPITAL_COMMUNITY): Payer: BC Managed Care – PPO | Admitting: Anesthesiology

## 2019-03-02 DIAGNOSIS — N183 Chronic kidney disease, stage 3 (moderate): Secondary | ICD-10-CM | POA: Diagnosis present

## 2019-03-02 DIAGNOSIS — I129 Hypertensive chronic kidney disease with stage 1 through stage 4 chronic kidney disease, or unspecified chronic kidney disease: Secondary | ICD-10-CM | POA: Diagnosis present

## 2019-03-02 DIAGNOSIS — M87052 Idiopathic aseptic necrosis of left femur: Secondary | ICD-10-CM | POA: Diagnosis not present

## 2019-03-02 DIAGNOSIS — I10 Essential (primary) hypertension: Secondary | ICD-10-CM | POA: Diagnosis not present

## 2019-03-02 DIAGNOSIS — Z96642 Presence of left artificial hip joint: Secondary | ICD-10-CM

## 2019-03-02 DIAGNOSIS — Z86010 Personal history of colonic polyps: Secondary | ICD-10-CM | POA: Diagnosis not present

## 2019-03-02 DIAGNOSIS — G4730 Sleep apnea, unspecified: Secondary | ICD-10-CM | POA: Diagnosis not present

## 2019-03-02 DIAGNOSIS — M879 Osteonecrosis, unspecified: Secondary | ICD-10-CM | POA: Diagnosis present

## 2019-03-02 DIAGNOSIS — M1612 Unilateral primary osteoarthritis, left hip: Secondary | ICD-10-CM | POA: Diagnosis not present

## 2019-03-02 DIAGNOSIS — B1920 Unspecified viral hepatitis C without hepatic coma: Secondary | ICD-10-CM | POA: Diagnosis not present

## 2019-03-02 DIAGNOSIS — Z6841 Body Mass Index (BMI) 40.0 and over, adult: Secondary | ICD-10-CM

## 2019-03-02 DIAGNOSIS — Z96641 Presence of right artificial hip joint: Secondary | ICD-10-CM | POA: Diagnosis not present

## 2019-03-02 DIAGNOSIS — Z471 Aftercare following joint replacement surgery: Secondary | ICD-10-CM | POA: Diagnosis not present

## 2019-03-02 DIAGNOSIS — Z87891 Personal history of nicotine dependence: Secondary | ICD-10-CM | POA: Diagnosis not present

## 2019-03-02 DIAGNOSIS — J449 Chronic obstructive pulmonary disease, unspecified: Secondary | ICD-10-CM | POA: Diagnosis present

## 2019-03-02 DIAGNOSIS — E785 Hyperlipidemia, unspecified: Secondary | ICD-10-CM | POA: Diagnosis not present

## 2019-03-02 DIAGNOSIS — M25552 Pain in left hip: Secondary | ICD-10-CM | POA: Diagnosis not present

## 2019-03-02 DIAGNOSIS — E782 Mixed hyperlipidemia: Secondary | ICD-10-CM | POA: Diagnosis not present

## 2019-03-02 HISTORY — PX: TOTAL HIP ARTHROPLASTY: SHX124

## 2019-03-02 LAB — GLUCOSE, CAPILLARY: Glucose-Capillary: 144 mg/dL — ABNORMAL HIGH (ref 70–99)

## 2019-03-02 SURGERY — ARTHROPLASTY, HIP, TOTAL, ANTERIOR APPROACH
Anesthesia: General | Site: Hip | Laterality: Left

## 2019-03-02 MED ORDER — HYDROMORPHONE HCL 1 MG/ML IJ SOLN
0.5000 mg | INTRAMUSCULAR | Status: DC | PRN
  Administered 2019-03-02: 0.5 mg via INTRAVENOUS
  Filled 2019-03-02: qty 1

## 2019-03-02 MED ORDER — POLYETHYLENE GLYCOL 3350 17 G PO PACK: 17.0000 g | PACK | Freq: Every day | ORAL | Status: DC | PRN

## 2019-03-02 MED ORDER — DEXAMETHASONE SODIUM PHOSPHATE 10 MG/ML IJ SOLN
INTRAMUSCULAR | Status: DC | PRN
  Administered 2019-03-02: 10 mg via INTRAVENOUS

## 2019-03-02 MED ORDER — AMLODIPINE BESYLATE 10 MG PO TABS
10.0000 mg | ORAL_TABLET | Freq: Every day | ORAL | Status: DC
  Administered 2019-03-03 – 2019-03-04 (×2): 10 mg via ORAL
  Filled 2019-03-02 (×3): qty 1

## 2019-03-02 MED ORDER — MEPERIDINE HCL 50 MG/ML IJ SOLN: 6.2500 mg | INTRAMUSCULAR | Status: DC | PRN

## 2019-03-02 MED ORDER — LABETALOL HCL 5 MG/ML IV SOLN
INTRAVENOUS | Status: AC
  Filled 2019-03-02: qty 4

## 2019-03-02 MED ORDER — LABETALOL HCL 5 MG/ML IV SOLN
10.0000 mg | INTRAVENOUS | Status: AC | PRN
  Administered 2019-03-02: 5 mg via INTRAVENOUS
  Administered 2019-03-02: 10 mg via INTRAVENOUS

## 2019-03-02 MED ORDER — SODIUM CHLORIDE 0.9 % IV SOLN
INTRAVENOUS | Status: DC
  Administered 2019-03-02 – 2019-03-03 (×2): via INTRAVENOUS

## 2019-03-02 MED ORDER — SODIUM CHLORIDE 0.9 % IR SOLN
Status: DC | PRN
  Administered 2019-03-02: 1000 mL

## 2019-03-02 MED ORDER — PROPOFOL 10 MG/ML IV BOLUS
INTRAVENOUS | Status: AC
  Filled 2019-03-02: qty 20

## 2019-03-02 MED ORDER — CEFAZOLIN SODIUM-DEXTROSE 2-4 GM/100ML-% IV SOLN
2.0000 g | Freq: Four times a day (QID) | INTRAVENOUS | Status: AC
  Administered 2019-03-02 (×2): 2 g via INTRAVENOUS
  Filled 2019-03-02 (×2): qty 100

## 2019-03-02 MED ORDER — ALBUTEROL SULFATE (2.5 MG/3ML) 0.083% IN NEBU: 2.5000 mg | INHALATION_SOLUTION | Freq: Four times a day (QID) | RESPIRATORY_TRACT | Status: DC | PRN

## 2019-03-02 MED ORDER — FENTANYL CITRATE (PF) 100 MCG/2ML IJ SOLN
INTRAMUSCULAR | Status: AC
  Filled 2019-03-02: qty 2

## 2019-03-02 MED ORDER — PANTOPRAZOLE SODIUM 40 MG PO TBEC
40.0000 mg | DELAYED_RELEASE_TABLET | Freq: Every day | ORAL | Status: DC
  Administered 2019-03-03 – 2019-03-04 (×2): 40 mg via ORAL
  Filled 2019-03-02 (×2): qty 1

## 2019-03-02 MED ORDER — ROCURONIUM BROMIDE 100 MG/10ML IV SOLN
INTRAVENOUS | Status: DC | PRN
  Administered 2019-03-02: 50 mg via INTRAVENOUS

## 2019-03-02 MED ORDER — ACETAMINOPHEN 325 MG PO TABS
325.0000 mg | ORAL_TABLET | Freq: Four times a day (QID) | ORAL | Status: DC | PRN
  Administered 2019-03-03: 650 mg via ORAL
  Filled 2019-03-02: qty 2

## 2019-03-02 MED ORDER — OXYCODONE HCL 5 MG PO TABS
5.0000 mg | ORAL_TABLET | ORAL | Status: DC | PRN
  Administered 2019-03-02: 10 mg via ORAL
  Filled 2019-03-02: qty 2

## 2019-03-02 MED ORDER — ONDANSETRON HCL 4 MG/2ML IJ SOLN
INTRAMUSCULAR | Status: DC | PRN
  Administered 2019-03-02: 4 mg via INTRAVENOUS

## 2019-03-02 MED ORDER — MIDAZOLAM HCL 2 MG/2ML IJ SOLN
INTRAMUSCULAR | Status: AC
  Filled 2019-03-02: qty 2

## 2019-03-02 MED ORDER — ONDANSETRON HCL 4 MG/2ML IJ SOLN: 4.0000 mg | Freq: Four times a day (QID) | INTRAMUSCULAR | Status: DC | PRN

## 2019-03-02 MED ORDER — STERILE WATER FOR IRRIGATION IR SOLN
Status: DC | PRN
  Administered 2019-03-02 (×2): 1000 mL

## 2019-03-02 MED ORDER — FENOFIBRATE 160 MG PO TABS
160.0000 mg | ORAL_TABLET | Freq: Every day | ORAL | Status: DC
  Administered 2019-03-03 – 2019-03-04 (×2): 160 mg via ORAL
  Filled 2019-03-02 (×2): qty 1

## 2019-03-02 MED ORDER — CHLORHEXIDINE GLUCONATE 4 % EX LIQD: 60.0000 mL | Freq: Once | CUTANEOUS | Status: DC

## 2019-03-02 MED ORDER — FENTANYL CITRATE (PF) 250 MCG/5ML IJ SOLN
INTRAMUSCULAR | Status: AC
  Filled 2019-03-02: qty 5

## 2019-03-02 MED ORDER — FENTANYL CITRATE (PF) 100 MCG/2ML IJ SOLN
INTRAMUSCULAR | Status: DC | PRN
  Administered 2019-03-02 (×3): 100 ug via INTRAVENOUS
  Administered 2019-03-02: 50 ug via INTRAVENOUS

## 2019-03-02 MED ORDER — POVIDONE-IODINE 10 % EX SWAB: 2.0000 | Freq: Once | CUTANEOUS | Status: DC

## 2019-03-02 MED ORDER — TRANEXAMIC ACID-NACL 1000-0.7 MG/100ML-% IV SOLN
1000.0000 mg | INTRAVENOUS | Status: AC
  Administered 2019-03-02: 1000 mg via INTRAVENOUS
  Filled 2019-03-02: qty 100

## 2019-03-02 MED ORDER — PHENOL 1.4 % MT LIQD: 1.0000 | OROMUCOSAL | Status: DC | PRN

## 2019-03-02 MED ORDER — DOCUSATE SODIUM 100 MG PO CAPS
100.0000 mg | ORAL_CAPSULE | Freq: Two times a day (BID) | ORAL | Status: DC
  Administered 2019-03-02 – 2019-03-04 (×4): 100 mg via ORAL
  Filled 2019-03-02 (×4): qty 1

## 2019-03-02 MED ORDER — SUCCINYLCHOLINE CHLORIDE 20 MG/ML IJ SOLN
INTRAMUSCULAR | Status: DC | PRN
  Administered 2019-03-02: 160 mg via INTRAVENOUS

## 2019-03-02 MED ORDER — HYDROMORPHONE HCL 1 MG/ML IJ SOLN
0.2500 mg | INTRAMUSCULAR | Status: DC | PRN
  Administered 2019-03-02 (×4): 0.5 mg via INTRAVENOUS

## 2019-03-02 MED ORDER — OXYCODONE HCL 5 MG PO TABS
10.0000 mg | ORAL_TABLET | ORAL | Status: DC | PRN
  Administered 2019-03-02 – 2019-03-04 (×9): 15 mg via ORAL
  Filled 2019-03-02 (×10): qty 3

## 2019-03-02 MED ORDER — ROCURONIUM BROMIDE 10 MG/ML (PF) SYRINGE
PREFILLED_SYRINGE | INTRAVENOUS | Status: AC
  Filled 2019-03-02: qty 10

## 2019-03-02 MED ORDER — ASPIRIN 81 MG PO CHEW
81.0000 mg | CHEWABLE_TABLET | Freq: Two times a day (BID) | ORAL | Status: DC
  Administered 2019-03-02 – 2019-03-04 (×4): 81 mg via ORAL
  Filled 2019-03-02 (×4): qty 1

## 2019-03-02 MED ORDER — MENTHOL 3 MG MT LOZG: 1.0000 | LOZENGE | OROMUCOSAL | Status: DC | PRN

## 2019-03-02 MED ORDER — METOCLOPRAMIDE HCL 5 MG PO TABS: 5.0000 mg | ORAL_TABLET | Freq: Three times a day (TID) | ORAL | Status: DC | PRN

## 2019-03-02 MED ORDER — ONDANSETRON HCL 4 MG PO TABS: 4.0000 mg | ORAL_TABLET | Freq: Four times a day (QID) | ORAL | Status: DC | PRN

## 2019-03-02 MED ORDER — METHOCARBAMOL 500 MG PO TABS
500.0000 mg | ORAL_TABLET | Freq: Four times a day (QID) | ORAL | Status: DC | PRN
  Administered 2019-03-02 – 2019-03-03 (×2): 500 mg via ORAL
  Filled 2019-03-02 (×2): qty 1

## 2019-03-02 MED ORDER — LORATADINE 10 MG PO TABS
10.0000 mg | ORAL_TABLET | Freq: Every day | ORAL | Status: DC
  Administered 2019-03-03 – 2019-03-04 (×2): 10 mg via ORAL
  Filled 2019-03-02 (×2): qty 1

## 2019-03-02 MED ORDER — MIDAZOLAM HCL 5 MG/5ML IJ SOLN
INTRAMUSCULAR | Status: DC | PRN
  Administered 2019-03-02: 2 mg via INTRAVENOUS

## 2019-03-02 MED ORDER — ONDANSETRON HCL 4 MG/2ML IJ SOLN
INTRAMUSCULAR | Status: AC
  Filled 2019-03-02: qty 2

## 2019-03-02 MED ORDER — HYDROMORPHONE HCL 1 MG/ML IJ SOLN
INTRAMUSCULAR | Status: AC
  Filled 2019-03-02: qty 2

## 2019-03-02 MED ORDER — DEXAMETHASONE SODIUM PHOSPHATE 10 MG/ML IJ SOLN
INTRAMUSCULAR | Status: AC
  Filled 2019-03-02: qty 1

## 2019-03-02 MED ORDER — METOCLOPRAMIDE HCL 5 MG/ML IJ SOLN: 5.0000 mg | Freq: Three times a day (TID) | INTRAMUSCULAR | Status: DC | PRN

## 2019-03-02 MED ORDER — HYDRALAZINE HCL 20 MG/ML IJ SOLN: 10.0000 mg | Freq: Four times a day (QID) | INTRAMUSCULAR | Status: DC | PRN

## 2019-03-02 MED ORDER — LACTATED RINGERS IV SOLN
INTRAVENOUS | Status: DC
  Administered 2019-03-02 (×2): via INTRAVENOUS

## 2019-03-02 MED ORDER — SUGAMMADEX SODIUM 500 MG/5ML IV SOLN
INTRAVENOUS | Status: DC | PRN
  Administered 2019-03-02: 250 mg via INTRAVENOUS

## 2019-03-02 MED ORDER — METHOCARBAMOL 500 MG IVPB - SIMPLE MED
INTRAVENOUS | Status: AC
  Filled 2019-03-02: qty 50

## 2019-03-02 MED ORDER — SUGAMMADEX SODIUM 500 MG/5ML IV SOLN
INTRAVENOUS | Status: AC
  Filled 2019-03-02: qty 5

## 2019-03-02 MED ORDER — DIPHENHYDRAMINE HCL 12.5 MG/5ML PO ELIX: 12.5000 mg | ORAL_SOLUTION | ORAL | Status: DC | PRN

## 2019-03-02 MED ORDER — METHOCARBAMOL 500 MG IVPB - SIMPLE MED
500.0000 mg | Freq: Four times a day (QID) | INTRAVENOUS | Status: DC | PRN
  Administered 2019-03-02: 500 mg via INTRAVENOUS
  Filled 2019-03-02: qty 50

## 2019-03-02 MED ORDER — LIDOCAINE HCL (CARDIAC) PF 100 MG/5ML IV SOSY
PREFILLED_SYRINGE | INTRAVENOUS | Status: DC | PRN
  Administered 2019-03-02: 100 mg via INTRATRACHEAL

## 2019-03-02 MED ORDER — LABETALOL HCL 5 MG/ML IV SOLN
INTRAVENOUS | Status: DC | PRN
  Administered 2019-03-02: 5 mg via INTRAVENOUS

## 2019-03-02 MED ORDER — PROPOFOL 10 MG/ML IV BOLUS
INTRAVENOUS | Status: DC | PRN
  Administered 2019-03-02: 200 mg via INTRAVENOUS

## 2019-03-02 MED ORDER — ALUM & MAG HYDROXIDE-SIMETH 200-200-20 MG/5ML PO SUSP: 30.0000 mL | ORAL | Status: DC | PRN

## 2019-03-02 MED ORDER — ONDANSETRON HCL 4 MG/2ML IJ SOLN: 4.0000 mg | Freq: Once | INTRAMUSCULAR | Status: DC | PRN

## 2019-03-02 SURGICAL SUPPLY — 42 items
ACETAB CUP W/GRIPTION 54 (Plate) ×2 IMPLANT
BAG ZIPLOCK 12X15 (MISCELLANEOUS) IMPLANT
BENZOIN TINCTURE PRP APPL 2/3 (GAUZE/BANDAGES/DRESSINGS) IMPLANT
BLADE SAW SGTL 18X1.27X75 (BLADE) ×2 IMPLANT
BLADE SURG SZ10 CARB STEEL (BLADE) ×4 IMPLANT
COVER PERINEAL POST (MISCELLANEOUS) ×2 IMPLANT
COVER SURGICAL LIGHT HANDLE (MISCELLANEOUS) ×2 IMPLANT
COVER WAND RF STERILE (DRAPES) IMPLANT
CUP ACETAB W/GRIPTION 54 (Plate) IMPLANT
DERMABOND ADVANCED (GAUZE/BANDAGES/DRESSINGS) ×1
DERMABOND ADVANCED .7 DNX12 (GAUZE/BANDAGES/DRESSINGS) IMPLANT
DRAPE STERI IOBAN 125X83 (DRAPES) ×2 IMPLANT
DRAPE U-SHAPE 47X51 STRL (DRAPES) ×4 IMPLANT
DRSG AQUACEL AG ADV 3.5X10 (GAUZE/BANDAGES/DRESSINGS) ×2 IMPLANT
DURAPREP 26ML APPLICATOR (WOUND CARE) ×2 IMPLANT
ELECT REM PT RETURN 15FT ADLT (MISCELLANEOUS) ×2 IMPLANT
GAUZE XEROFORM 1X8 LF (GAUZE/BANDAGES/DRESSINGS) ×3 IMPLANT
GLOVE BIO SURGEON STRL SZ7.5 (GLOVE) ×2 IMPLANT
GLOVE BIOGEL PI IND STRL 8 (GLOVE) ×2 IMPLANT
GLOVE BIOGEL PI INDICATOR 8 (GLOVE) ×2
GLOVE ECLIPSE 8.0 STRL XLNG CF (GLOVE) ×2 IMPLANT
GOWN STRL REUS W/TWL XL LVL3 (GOWN DISPOSABLE) ×4 IMPLANT
HANDPIECE INTERPULSE COAX TIP (DISPOSABLE) ×1
HEAD M SROM 36MM PLUS 1.5 (Hips) IMPLANT
HOLDER FOLEY CATH W/STRAP (MISCELLANEOUS) ×2 IMPLANT
KIT TURNOVER KIT A (KITS) IMPLANT
LINER NEUTRAL 54X36MM PLUS 4 (Hips) ×1 IMPLANT
PACK ANTERIOR HIP CUSTOM (KITS) ×2 IMPLANT
SET HNDPC FAN SPRY TIP SCT (DISPOSABLE) ×1 IMPLANT
SROM M HEAD 36MM PLUS 1.5 (Hips) ×2 IMPLANT
STAPLER VISISTAT 35W (STAPLE) ×1 IMPLANT
STEM CORAIL HC SZ14 135 (Stem) ×1 IMPLANT
STRIP CLOSURE SKIN 1/2X4 (GAUZE/BANDAGES/DRESSINGS) IMPLANT
SUT ETHIBOND NAB CT1 #1 30IN (SUTURE) ×2 IMPLANT
SUT ETHILON 2 0 PS N (SUTURE) IMPLANT
SUT MNCRL AB 4-0 PS2 18 (SUTURE) IMPLANT
SUT VIC AB 0 CT1 36 (SUTURE) ×2 IMPLANT
SUT VIC AB 1 CT1 36 (SUTURE) ×2 IMPLANT
SUT VIC AB 2-0 CT1 27 (SUTURE) ×3
SUT VIC AB 2-0 CT1 TAPERPNT 27 (SUTURE) ×2 IMPLANT
TRAY FOLEY MTR SLVR 16FR STAT (SET/KITS/TRAYS/PACK) ×2 IMPLANT
YANKAUER SUCT BULB TIP 10FT TU (MISCELLANEOUS) ×2 IMPLANT

## 2019-03-02 NOTE — Plan of Care (Signed)
Plan of care 

## 2019-03-02 NOTE — Transfer of Care (Signed)
Immediate Anesthesia Transfer of Care Note  Patient: Mathew Lara  Procedure(s) Performed: LEFT TOTAL HIP ARTHROPLASTY ANTERIOR APPROACH (Left Hip)  Patient Location: PACU  Anesthesia Type:General  Level of Consciousness: awake, alert  and oriented  Airway & Oxygen Therapy: Patient Spontanous Breathing and Patient connected to face mask oxygen  Post-op Assessment: Report given to RN and Post -op Vital signs reviewed and stable  Post vital signs: Reviewed and stable  Last Vitals:  Vitals Value Taken Time  BP 163/103 03/02/19 1126  Temp    Pulse 97 03/02/19 1127  Resp 25 03/02/19 1127  SpO2 97 % 03/02/19 1127  Vitals shown include unvalidated device data.  Last Pain:  Vitals:   03/02/19 0720  TempSrc: Oral  PainSc: 0-No pain         Complications: No apparent anesthesia complications

## 2019-03-02 NOTE — H&P (Signed)
TOTAL HIP ADMISSION H&P  Patient is admitted for left total hip arthroplasty.  Subjective:  Chief Complaint: left hip pain  HPI: Mathew Lara, 58 y.o. male, has a history of pain and functional disability in the left hip(s) due to arthritis and patient has failed non-surgical conservative treatments for greater than 12 weeks to include NSAID's and/or analgesics, corticosteriod injections, flexibility and strengthening excercises, weight reduction as appropriate and activity modification.  Onset of symptoms was gradual starting 2 years ago with gradually worsening course since that time.The patient noted no past surgery on the left hip(s).  Patient currently rates pain in the left hip at 10 out of 10 with activity. Patient has night pain, worsening of pain with activity and weight bearing, trendelenberg gait, pain that interfers with activities of daily living, pain with passive range of motion and crepitus. Patient has evidence of subchondral cysts, subchondral sclerosis, periarticular osteophytes and joint space narrowing by imaging studies. This condition presents safety issues increasing the risk of falls.  There is no current active infection.  Patient Active Problem List   Diagnosis Date Noted  . Acute kidney injury ([place]) 03/26/2018  . Rash 03/26/2018  . Chest pain 03/08/2018  . CKD (chronic kidney disease), stage III ([place]) 03/08/2018  . COPD (chronic obstructive pulmonary disease) ([place]) 03/08/2018  . Avascular necrosis of bone of left hip ([place]) 07/27/2017  . Avascular necrosis of bone of right hip ([place]) 06/04/2016  . Status post total replacement of right hip 06/04/2016  . Hepatitis C 03/21/2015  . History of alcohol abuse 11/08/2013  . Tobacco abuse 11/08/2013  . Essential hypertension, benign 11/07/2013  . Abnormal ECG 11/07/2013   Past Medical History:  Diagnosis Date  . Alcohol abuse   . Anxiety   . Bronchitis   . Chronic hip pain   . Chronic kidney disease (CKD) stage  G3b/A3, moderately decreased glomerular filtration rate (GFR) between 30-44 mL/min/1.73 square meter and albuminuria creatinine ratio greater than 300 mg/g ([place]) 01/03/2018  . COPD (chronic obstructive pulmonary disease) ([place])   . Hepatitis C   . Hypertension   . Mixed hyperlipidemia   . Renal disorder    states renal dysfunction, "strained kidney"  . Sleep apnea     Past Surgical History:  Procedure Laterality Date  . COLONOSCOPY N/A 01/23/2014   [facility]:The LEFT COLON IS EXTREMELY redundant/TWO RECTAL POLYPS REMOVED/SMALL VOLUME RECTAL BLEEDING MOST LIKELY DUE TO Small internal hemorrhoids  . ESOPHAGOGASTRODUODENOSCOPY N/A 01/23/2014   [facility]:QPRFFMBW ring at the gastroesophageal junction/Medium sized hiatal hernia/NDYSPEPSIA MOST LIKELY DUE TO GERD/MILD Non-erosive gastritis  . ESOPHAGOGASTRODUODENOSCOPY N/A 04/18/2018   Procedure: ESOPHAGOGASTRODUODENOSCOPY (EGD);  Surgeon: Daneil Dolin, MD;  [facility];  Service: Endoscopy;  Laterality: N/A;  . HEMORRHOID BANDING  01/23/2014   Procedure: HEMORRHOID BANDING;  Surgeon: Danie Binder, MD;  Location: [facility];  Service: Endoscopy;;  . JOINT REPLACEMENT Right 2017  . MOUTH SURGERY    . TOTAL HIP ARTHROPLASTY Right 06/04/2016   Procedure: RIGHT TOTAL HIP ARTHROPLASTY ANTERIOR APPROACH;  Surgeon: Mcarthur Rossetti, MD;  Location: [facility];  Service: Orthopedics;  Laterality: Right;    Current Facility-Administered Medications  Medication Dose Route Frequency Provider Last Rate Last Dose  . ceFAZolin (ANCEF) 3 g in dextrose 5 % 50 mL IVPB  3 g Intravenous On Call to OR Mcarthur Rossetti, MD      . chlorhexidine (HIBICLENS) 4 % liquid 4 application  60 mL Topical Once Pete Pelt, PA-C      .  lactated ringers infusion   Intravenous Continuous Josephine Igo, MD      . povidone-iodine 10 % swab 2 application  2 application Topical Once Erskine Emery W, PA-C      . tranexamic acid (CYKLOKAPRON) IVPB 1,000 mg  1,000  mg Intravenous To OR Pete Pelt, PA-C       No Known Allergies  Social History   Tobacco Use  . Smoking status: Former Smoker    Types: Cigarettes    Quit date: 05/28/2014    Years since quitting: 4.7  . Smokeless tobacco: Never Used  . Tobacco comment: quit in November 2015 after smoking x 20 yrs.  Substance Use Topics  . Alcohol use: Not Currently    Alcohol/week: 0.0 standard drinks    Comment: rare    Family History  Problem Relation Age of Onset  . Cancer Mother      Review of Systems  Musculoskeletal: Positive for joint pain.  All other systems reviewed and are negative.   Objective:  Physical Exam  Constitutional: He is oriented to person, place, and time. He appears well-developed and well-nourished.  HENT:  Head: Normocephalic and atraumatic.  Eyes: Pupils are equal, round, and reactive to light. EOM are normal.  Neck: Normal range of motion. Neck supple.  Cardiovascular: Normal rate and regular rhythm.  Respiratory: Effort normal and breath sounds normal.  GI: Soft. Bowel sounds are normal.  Musculoskeletal:     Left hip: He exhibits decreased range of motion, decreased strength, tenderness and bony tenderness.  Neurological: He is alert and oriented to person, place, and time.  Skin: Skin is warm and dry.  Psychiatric: He has a normal mood and affect.    Vital signs in last 24 hours: Weight:  [124 kg] 124 kg (06/26 0657)  Labs:   Estimated body mass index is 41.57 kg/m as calculated from the following:   Height as of this encounter: 5\' 8"  (1.727 m).   Weight as of this encounter: 124 kg.   Imaging Review Plain radiographs demonstrate severe degenerative joint disease of the left hip(s). The bone quality appears to be good for age and reported activity level.      Assessment/Plan:  End stage arthritis, left hip(s)  The patient history, physical examination, clinical judgement of the provider and imaging studies are consistent with end  stage degenerative joint disease of the left hip(s) and total hip arthroplasty is deemed medically necessary. The treatment options including medical management, injection therapy, arthroscopy and arthroplasty were discussed at length. The risks and benefits of total hip arthroplasty were presented and reviewed. The risks due to aseptic loosening, infection, stiffness, dislocation/subluxation,  thromboembolic complications and other imponderables were discussed.  The patient acknowledged the explanation, agreed to proceed with the plan and consent was signed. Patient is being admitted for inpatient treatment for surgery, pain control, PT, OT, prophylactic antibiotics, VTE prophylaxis, progressive ambulation and ADL's and discharge planning.The patient is planning to be discharged home with home health services

## 2019-03-02 NOTE — Op Note (Signed)
NAMEZAYNE, DRAHEIM MEDICAL RECORD [id number] ACCOUNT [id number] DATE OF BIRTH:12-01-1960 [facility] LOCATION: [facility] PHYSICIAN:Marlin Jarrard Kerry [place], MD  OPERATIVE REPORT  DATE OF PROCEDURE:  03/02/2019  PREOPERATIVE DIAGNOSIS:  Severe osteoarthritis and degenerative joint disease, left hip.  POSTOPERATIVE DIAGNOSIS:  Severe osteoarthritis and degenerative joint disease, left hip.  PROCEDURE:  Left total hip arthroplasty through direct anterior approach.  IMPLANTS:  DePuy Sector Gription acetabular component size 54, size 36+4 polyethylene liner, size 14 Corail femoral component with high offset, size 36+1.5 metal hip ball.  SURGEON:  Lind Guest. Ninfa Linden, MD  ASSISTANT:  Erskine Emery, PA-C  ANESTHESIA:  General.  ANTIBIOTICS:  3 g IV Ancef.  ESTIMATED BLOOD LOSS:  300 mL.  COMPLICATIONS:  None.  INDICATIONS:  The patient is a 58 year old gentleman well known to me.  He has debilitating arthritis involving both his hips.  He has a BMI of over 40.  We had actually performed a right total hip arthroplasty successfully in 2017.  His left hip shows  debilitating arthritis in that hip.  There is actually shortening of the hip and flattening of the femoral head.  His pain is daily and has detrimentally affected his mobility, his activities of daily living, and his quality of life.  At this point, he  does wish to proceed with a total hip arthroplasty on the left side.  Having had this done before, he is fully aware of the risks of acute blood loss anemia, nerve and vessel injury, fracture, infection, dislocation, DVT and implant failure.  He  understands these are all heightened risks given his morbid obesity.  He understands our goals are to decrease pain, improve mobility and overall improve quality of life.  DESCRIPTION OF PROCEDURE:  After informed consent was obtained and appropriate left hip was marked, he was brought to the operating room where general anesthesia  was obtained while he was on a stretcher.  Traction boots were placed on both his feet.  He  was then placed supine on the Hana fracture table, the perineal post in place and both legs in-line skeletal traction device and no traction applied.  His left operative hip was prepped and draped with DuraPrep and sterile drapes.  A time-out was called,  and he was identified as correct patient, correct left hip.  We then made an incision just inferior and posterior to the anterior superior iliac spine and carried this obliquely down the leg.  We dissected down to tensor fascia lata muscle.  Tensor  fascia was then divided longitudinally to proceed with a direct anterior approach to the hip.  We identified and cauterized circumflex vessels.  I then identified the hip capsule, opened up the hip capsule in an L-type format, finding a moderate joint  effusion and significant periarticular osteophytes around the femoral head and neck.  We then placed Cobra retractors around the medial and lateral femoral neck and made our femoral neck cut with an oscillating saw and completed this with an osteotome.   We placed a corkscrew guide in the femoral head and removed the femoral head in its entirety and found the femoral head devoid of cartilage and significantly flattened.  I then placed a bent Hohmann over the medial acetabular rim and removed remnants of  the acetabular labrum and other debris.  We then began reaming under direct visualization from a size 44 reamer in stepwise increments up to a size 53 with all reamers under direct visualization.  The last reamer was placed under direct  fluoroscopy so I  could obtain my depth of reaming, my inclination, and anteversion.  I then placed the real DePuy Sector Gription acetabular component size 54.  We went with a 36+4 polyethylene liner given his offset.  Attention was then turned to the femur.  With the  leg externally rotated to 120 degrees, extended and adducted, we placed  a Mueller retractor medially and a Hohmann retractor behind the greater trochanter.  We released the lateral joint capsule and used a box-cutting osteotome to enter the femoral canal  and a rongeur to lateralize it.  We then began broaching using Corail broaching system from a size 8 broach, going all the way up to a size 14.  With the 14 in place, we trialed a high offset femoral neck and went with a 36+1.5 trial hip ball, reduced  this in the acetabulum.  We were pleased that we had increased his leg length as well as reproduced his previous offset.  He was stable on range of motion and under fluoroscopy as well.  We then dislocated the hip and removed the trial components.  I  placed the real Corail femoral component with high offset size 14 and the real 36+1.5 metal hip ball.  We again reduced this in the acetabulum, and I was pleased with stability.  We then irrigated the soft tissue with normal saline solution using  pulsatile lavage.  I closed a small remnant of the joint capsule with interrupted #1 Ethibond suture.  We used a running #1 Vicryl to close the tensor fascia, 0 Vicryl was used to close the deep tissue, 2-0 Vicryl was used to close the subcutaneous  tissue, and interrupted staples were placed on the skin.  A well-padded sterile dressing was applied.  He was awakened, extubated, and taken to recovery room in stable condition.  All final counts were correct.  There were no complications noted.  Of  note, Benita Stabile, PA-C, assisted the entire case.  His assistance was crucial for facilitating all aspects of this case.  LN/NUANCE  D:03/02/2019 T:03/02/2019 JOB:[id number]

## 2019-03-02 NOTE — Anesthesia Postprocedure Evaluation (Signed)
Anesthesia Post Note  Patient: Rhonin Trott  Procedure(s) Performed: LEFT TOTAL HIP ARTHROPLASTY ANTERIOR APPROACH (Left Hip)     Patient location during evaluation: PACU Anesthesia Type: General Level of consciousness: awake and alert and oriented Pain management: pain level controlled Vital Signs Assessment: post-procedure vital signs reviewed and stable Respiratory status: spontaneous breathing, nonlabored ventilation, respiratory function stable and patient connected to nasal cannula oxygen Cardiovascular status: blood pressure returned to baseline and stable Postop Assessment: no apparent nausea or vomiting Anesthetic complications: no    Last Vitals:  Vitals:   03/02/19 1255 03/02/19 1356  BP: (!) 139/96 (!) 147/110  Pulse: 98 (!) 104  Resp: 16 18  Temp:  36.7 C  SpO2: 99% 100%    Last Pain:  Vitals:   03/02/19 1412  TempSrc:   PainSc: 7                  Jodi Criscuolo A.

## 2019-03-02 NOTE — Plan of Care (Signed)
progressing 

## 2019-03-02 NOTE — Anesthesia Procedure Notes (Signed)
Procedure Name: Intubation Date/Time: 03/02/2019 9:52 AM Performed by: Sharlette Dense, CRNA Patient Re-evaluated:Patient Re-evaluated prior to induction Oxygen Delivery Method: Circle system utilized Preoxygenation: Pre-oxygenation with 100% oxygen Induction Type: IV induction Laryngoscope Size: Miller and 3 Grade View: Grade I Tube type: Oral Tube size: 8.0 mm Number of attempts: 1 Airway Equipment and Method: Stylet Placement Confirmation: ETT inserted through vocal cords under direct vision,  positive ETCO2 and breath sounds checked- equal and bilateral Secured at: 23 cm Tube secured with: Tape Dental Injury: Teeth and Oropharynx as per pre-operative assessment

## 2019-03-02 NOTE — Brief Op Note (Signed)
03/02/2019  10:56 AM  PATIENT:  Mathew Lara  58 y.o. male  PRE-OPERATIVE DIAGNOSIS:  Osteoarthritis Left Hip  POST-OPERATIVE DIAGNOSIS:  Osteoarthritis Left Hip  PROCEDURE:  Procedure(s): LEFT TOTAL HIP ARTHROPLASTY ANTERIOR APPROACH (Left)  SURGEON:  Surgeon(s) and Role:    Mcarthur Rossetti, MD - Primary  PHYSICIAN ASSISTANT: Benita Stabile, PA-C  ANESTHESIA:   general  EBL:  300 mL   COUNTS:  YES  DICTATION: .Other Dictation: Dictation Number [id number]  PLAN OF CARE: Admit to inpatient   PATIENT DISPOSITION:  PACU - hemodynamically stable.   Delay start of Pharmacological VTE agent (>24hrs) due to surgical blood loss or risk of bleeding: no

## 2019-03-02 NOTE — Evaluation (Signed)
Physical Therapy Evaluation Patient Details Name: Mathew Lara MRN: [id number] DOB: 1961-01-15 Today's Date: 03/02/2019   History of Present Illness  58 yo male s/p L DA-THA on 03/02/19. PMH includes hep C, AKI, CKD III, COPD, AVN R and L hip with history of R DA-THA 2017, alcohol abuse, anxiety.  Clinical Impression   Pt presents with L hip pain, decreased L hip strength post-surgically, difficulty performing mobility tasks, and increased time to perform mobility tasks. Pt to benefit from acute PT to address deficits. Pt ambulated hallway distance with RW with min guard assist, verbal cuing for form and safety provided. Pt educated on ankle pumps (20/hour) to perform this afternoon/evening to increase circulation, to pt's tolerance and limited by pain. PT to progress mobility as tolerated, and will continue to follow acutely.        Follow Up Recommendations Follow surgeon's recommendation for DC plan and follow-up therapies;Supervision for mobility/OOB    Equipment Recommendations  Rolling walker with 5" wheels    Recommendations for Other Services       Precautions / Restrictions Precautions Precautions: Fall Restrictions Weight Bearing Restrictions: No Other Position/Activity Restrictions: WBAT      Mobility  Bed Mobility Overal bed mobility: Needs Assistance Bed Mobility: Supine to Sit     Supine to sit: Min assist     General bed mobility comments: Min assist for LLE management, trunk elevation with HHA. Increased time and effort.  Transfers Overall transfer level: Needs assistance Equipment used: Rolling walker (2 wheeled) Transfers: Sit to/from Stand Sit to Stand: Min guard;From elevated surface         General transfer comment: Min guard for safety, verbal cuing for hand placement when rising.  Ambulation/Gait Ambulation/Gait assistance: Min guard Gait Distance (Feet): 50 Feet Assistive device: Rolling walker (2 wheeled) Gait Pattern/deviations:  Step-to pattern;Decreased step length - left;Decreased step length - right;Trunk flexed Gait velocity: decr   General Gait Details: Min guard for safety, verbal cuing for placement in RW, sequencing, upright posture.  Stairs            Wheelchair Mobility    Modified Rankin (Stroke Patients Only)       Balance Overall balance assessment: Mild deficits observed, not formally tested                                           Pertinent Vitals/Pain Pain Assessment: 0-10 Pain Score: 7  Pain Location: L hip Pain Descriptors / Indicators: Sore;Aching Pain Intervention(s): Limited activity within patient's tolerance;Monitored during session;Premedicated before session;Repositioned;Ice applied    Home Living Family/patient expects to be discharged to:: Private residence Living Arrangements: Spouse/significant other;Children(daughter) Available Help at Discharge: Family;Available 24 hours/day Type of Home: House Home Access: Stairs to enter Entrance Stairs-Rails: Left Entrance Stairs-Number of Steps: 7 Home Layout: Multi-level Home Equipment: Walker - 4 wheels;Cane - single point;Bedside commode      Prior Function Level of Independence: Independent with assistive device(s)         Comments: Pt reports using cane PTA     Hand Dominance   Dominant Hand: Right    Extremity/Trunk Assessment   Upper Extremity Assessment Upper Extremity Assessment: Overall WFL for tasks assessed    Lower Extremity Assessment Lower Extremity Assessment: Overall WFL for tasks assessed;LLE deficits/detail LLE Deficits / Details: suspected post-surgical weakness; able to perform ankle pumps, quad set, heel slide, SLR with lift  assist during bed mobility LLE Sensation: WNL    Cervical / Trunk Assessment Cervical / Trunk Assessment: Normal  Communication   Communication: No difficulties  Cognition Arousal/Alertness: Awake/alert Behavior During Therapy: WFL for  tasks assessed/performed Overall Cognitive Status: Within Functional Limits for tasks assessed                                        General Comments      Exercises     Assessment/Plan    PT Assessment Patient needs continued PT services  PT Problem List Decreased strength;Decreased mobility;Decreased activity tolerance;Decreased balance;Decreased knowledge of use of DME;Pain;Decreased range of motion       PT Treatment Interventions DME instruction;Therapeutic activities;Gait training;Therapeutic exercise;Patient/family education;Balance training;Stair training;Functional mobility training    PT Goals (Current goals can be found in the Care Plan section)  Acute Rehab PT Goals Patient Stated Goal: go home to wife PT Goal Formulation: With patient Time For Goal Achievement: 03/09/19 Potential to Achieve Goals: Good    Frequency 7X/week   Barriers to discharge        Co-evaluation               AM-PAC PT "6 Clicks" Mobility  Outcome Measure Help needed turning from your back to your side while in a flat bed without using bedrails?: A Little Help needed moving from lying on your back to sitting on the side of a flat bed without using bedrails?: A Little Help needed moving to and from a bed to a chair (including a wheelchair)?: A Little Help needed standing up from a chair using your arms (e.g., wheelchair or bedside chair)?: A Little Help needed to walk in hospital room?: A Little Help needed climbing 3-5 steps with a railing? : A Lot 6 Click Score: 17    End of Session Equipment Utilized During Treatment: Gait belt Activity Tolerance: Patient tolerated treatment well;Patient limited by pain Patient left: in chair;with call bell/phone within reach;with SCD's reapplied;with chair alarm set Nurse Communication: Mobility status PT Visit Diagnosis: Other abnormalities of gait and mobility (R26.89);Difficulty in walking, not elsewhere classified  (R26.2)    Time: 3762-8315 PT Time Calculation (min) (ACUTE ONLY): 17 min   Charges:   PT Evaluation $PT Eval Low Complexity: 1 Low          Shey Bartmess Conception Chancy, PT Acute Rehabilitation Services Pager [phone]  Office [phone]   Roxine Caddy D Elonda Husky 03/02/2019, 5:43 PM

## 2019-03-03 LAB — BASIC METABOLIC PANEL
Anion gap: 12 (ref 5–15)
BUN: 21 mg/dL — ABNORMAL HIGH (ref 6–20)
CO2: 21 mmol/L — ABNORMAL LOW (ref 22–32)
Calcium: 9.1 mg/dL (ref 8.9–10.3)
Chloride: 105 mmol/L (ref 98–111)
Creatinine, Ser: 1.64 mg/dL — ABNORMAL HIGH (ref 0.61–1.24)
GFR calc Af Amer: 52 mL/min — ABNORMAL LOW (ref >60)
GFR calc non Af Amer: 45 mL/min — ABNORMAL LOW (ref >60)
Glucose, Bld: 131 mg/dL — ABNORMAL HIGH (ref 70–99)
Potassium: 4.1 mmol/L (ref 3.5–5.1)
Sodium: 138 mmol/L (ref 135–145)

## 2019-03-03 LAB — CBC
HCT: 38.8 % — ABNORMAL LOW (ref 39.0–52.0)
Hemoglobin: 11.9 g/dL — ABNORMAL LOW (ref 13.0–17.0)
MCH: 27.7 pg (ref 26.0–34.0)
MCHC: 30.7 g/dL (ref 30.0–36.0)
MCV: 90.2 fL (ref 80.0–100.0)
Platelets: 286 10*3/uL (ref 150–400)
RBC: 4.3 MIL/uL (ref 4.22–5.81)
RDW: 16.2 % — ABNORMAL HIGH (ref 11.5–15.5)
WBC: 14.5 10*3/uL — ABNORMAL HIGH (ref 4.0–10.5)
nRBC: 0 % (ref 0.0–0.2)

## 2019-03-03 NOTE — Progress Notes (Signed)
    Home health agencies that serve [phone].        [place] Quality of Patient Care Rating Patient Survey Summary Rating  [facility] [phone] 4 out of 5 stars 4 out of [place] [phone] 3 out of 5 stars 5 out of [place] [phone] 3 out of 5 stars 4 out of [place] [phone] 3  out of 5 stars 4 out of [place] [phone] 4  out of 5 stars 3 out of [place] [phone] 4 out of 5 stars 4 out of [place] [phone] 4 out of 5 stars 4 out of 5 stars  [place] [phone] 4 out of 5 stars 4 out of [place] A[facility] [phone] 3 out of 5 stars 3 out of 5 stars  [facility] [place] [phone] 3  out of 5 stars 4 out of [place] [phone] 3 out of 5 stars 4 out of 5 stars  [facility] [phone] 3  out of 5 stars 3 out of [place] [phone] 3  out of 5 stars [place] [phone] 4  out of 5 stars 3 out of [place] number Footnote as displayed on [place]  1 This agency provides services under a federal waiver program to non-traditional, chronic long term population.  2 This agency provides services to a special needs population.  3 Not Available.  4 The number of patient episodes for this measure is too small to report.  5 This measure currently does not have data or provider has been certified/recertified for less than 6 months.  6 The national average for this measure is not provided because of state-to-state differences in data collection.  7 Medicare is not displaying rates  for this measure for any home health agency, because of an issue with the data.  8 There were problems with the data and they are being corrected.  9 Zero, or very few, patients met the survey's rules for inclusion. The scores shown, if any, reflect a very small number of surveys and may not accurately tell how an agency is doing.  10 Survey results are based on less than 12 months of data.  11 Fewer than 70 patients completed the survey. Use the scores shown, if any, with caution as the number of surveys may be too low to accurately tell how an agency is doing.  12 No survey results are available for this period.  13 Data suppressed by CMS for one or more quarters.

## 2019-03-03 NOTE — TOC Initial Note (Signed)
Transition of Care [facility]) - Initial/Assessment Note    Patient Details  Name: Mathew Lara MRN: [id number] Date of Birth: 25-Jan-1961  Transition of Care (TOC) CM/SW Contact:    Joaquin Courts, RN Phone Number: 03/03/2019, 11:49 AM  Clinical Narrative:                 CM spoke with patient at bedside. [facility] [place], reports has rolling walker and 3-in-1 at home.   Expected Discharge Plan: [place] Barriers to Discharge: Continued Medical Work up   Patient Goals and CMS Choice Patient states their goals for this hospitalization and ongoing recovery are:: to go home CMS Medicare.gov Compare Post Acute Care list provided to:: Patient Choice offered to / list presented to : Patient  Expected Discharge Plan and Services Expected Discharge Plan: [place]   Discharge Planning Services: CM Consult Post Acute Care Choice: [place] arrangements for the past 2 months: Single Family Home                 DME Arranged: N/A DME Agency: NA       HH Arranged: PT [place] [facility]: [facility] [facility] [employer] Date [place]: 03/03/19 Time [place]: [place] Representative spoke with at [place]: pre arranged by md office  Prior Living Arrangements/Services Living arrangements for the past 2 months: [place] with:: Spouse, Adult Children Patient language and need for interpreter reviewed:: Yes Do you feel safe going back to the place where you live?: Yes      Need for Family Participation in Patient Care: Yes (Comment) Care giver support system in place?: Yes (comment)   Criminal Activity/Legal Involvement Pertinent to Current Situation/Hospitalization: No - Comment as needed  Activities of Daily Living Home Assistive Devices/Equipment: Walker (specify type), Cane (specify quad or straight), Bedside commode/3-in-1, Eyeglasses ADL Screening (condition at  time of admission) Patient's cognitive ability adequate to safely complete daily activities?: Yes Is the patient deaf or have difficulty hearing?: No Does the patient have difficulty seeing, even when wearing glasses/contacts?: No Does the patient have difficulty concentrating, remembering, or making decisions?: No Patient able to express need for assistance with ADLs?: Yes Does the patient have difficulty dressing or bathing?: No Independently performs ADLs?: Yes (appropriate for developmental age) Does the patient have difficulty walking or climbing stairs?: No Weakness of Legs: None Weakness of Arms/Hands: None  Permission Sought/Granted                  Emotional Assessment Appearance:: Appears stated age Attitude/Demeanor/Rapport: Engaged Affect (typically observed): Accepting Orientation: : Oriented to Place, Oriented to  Time, Oriented to Situation, Oriented to Self   Psych Involvement: No (comment)  Admission diagnosis:  Osteoarthritis Left Hip Patient Active Problem List   Diagnosis Date Noted  . 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 Acute kidney injury ([place]) 03/26/2018  . Rash 03/26/2018  . Chest pain 03/08/2018  . CKD (chronic kidney disease), stage III ([place]) 03/08/2018  . COPD (chronic obstructive pulmonary disease) ([place]) 03/08/2018  . Avascular necrosis of bone of left hip ([place]) 07/27/2017  . Avascular necrosis of bone of right hip ([place]) 06/04/2016  . Status post total replacement of right hip 06/04/2016  . Hepatitis C 03/21/2015  . History of alcohol abuse 11/08/2013  . Tobacco abuse 11/08/2013  . Essential hypertension, benign 11/07/2013  . Abnormal ECG  11/07/2013   PCP:  [name], MD Pharmacy:   [facility] [place] [address] [place] [address] Phone: [phone] Fax: [phone]     Social Determinants of Health (SDOH) Interventions    Readmission Risk  Interventions No flowsheet data found.

## 2019-03-03 NOTE — Discharge Instructions (Signed)

## 2019-03-03 NOTE — Progress Notes (Signed)
Physical Therapy Treatment Patient Details Name: Mathew Lara MRN: [id number] DOB: 07/24/1961 Today's Date: 03/03/2019    History of Present Illness 58 yo male s/p L DA-THA on 03/02/19. PMH includes hep C, AKI, CKD III, COPD, AVN R and L hip with history of R DA-THA 2017, alcohol abuse, anxiety.    PT Comments    Pt ambulated again in hallway this afternoon.  Pt remained in recliner upon returning to room.  Pt reports he had been performing exercises in recliner since this morning so encouraged pt to take a break and moderation, and not perform again until after dinner.    Follow Up Recommendations  Follow surgeon's recommendation for DC plan and follow-up therapies;Supervision for mobility/OOB     Equipment Recommendations  Rolling walker with 5" wheels    Recommendations for Other Services       Precautions / Restrictions Precautions Precautions: Fall Restrictions Other Position/Activity Restrictions: WBAT    Mobility  Bed Mobility Overal bed mobility: Needs Assistance Bed Mobility: Supine to Sit     Supine to sit: Min guard;HOB elevated     General bed mobility comments: pt up in recliner on arrival  Transfers Overall transfer level: Needs assistance Equipment used: Rolling walker (2 wheeled) Transfers: Sit to/from Stand Sit to Stand: Min guard         General transfer comment: verbal cues for UE and LE positioning for safety and pain control  Ambulation/Gait Ambulation/Gait assistance: Min guard Gait Distance (Feet): 200 Feet Assistive device: Rolling walker (2 wheeled) Gait Pattern/deviations: Step-to pattern;Trunk flexed;Decreased stance time - left;Antalgic Gait velocity: decr   General Gait Details: verbal cues for sequence, step length, RW positioning, posture   Stairs             Wheelchair Mobility    Modified Rankin (Stroke Patients Only)       Balance                                            Cognition  Arousal/Alertness: Awake/alert Behavior During Therapy: WFL for tasks assessed/performed Overall Cognitive Status: Within Functional Limits for tasks assessed                                        Exercises    General Comments        Pertinent Vitals/Pain Pain Assessment: 0-10 Pain Score: 6  Pain Location: L hip Pain Descriptors / Indicators: Sore;Aching Pain Intervention(s): Monitored during session;Repositioned    Home Living                      Prior Function            PT Goals (current goals can now be found in the care plan section) Progress towards PT goals: Progressing toward goals    Frequency    7X/week      PT Plan Current plan remains appropriate    Co-evaluation              AM-PAC PT "6 Clicks" Mobility   Outcome Measure  Help needed turning from your back to your side while in a flat bed without using bedrails?: A Little Help needed moving from lying on your back to sitting on the side of a flat bed without  using bedrails?: A Little Help needed moving to and from a bed to a chair (including a wheelchair)?: A Little Help needed standing up from a chair using your arms (e.g., wheelchair or bedside chair)?: A Little Help needed to walk in hospital room?: A Little Help needed climbing 3-5 steps with a railing? : A Little 6 Click Score: 18    End of Session Equipment Utilized During Treatment: Gait belt Activity Tolerance: Patient tolerated treatment well Patient left: in chair;with call bell/phone within reach;with chair alarm set Nurse Communication: Mobility status PT Visit Diagnosis: Other abnormalities of gait and mobility (R26.89);Difficulty in walking, not elsewhere classified (R26.2)     Time: 2924-4628 PT Time Calculation (min) (ACUTE ONLY): 11 min  Charges:  $Gait Training: 8-22 mins           Carmelia Bake, PT, DPT Acute Rehabilitation Services Office: [phone] Pager:  [phone]  Trena Platt 03/03/2019, 3:26 PM

## 2019-03-03 NOTE — Progress Notes (Signed)
Physical Therapy Treatment Patient Details Name: Mathew Lara MRN: [id number] DOB: Jan 01, 1961 Today's Date: 03/03/2019    History of Present Illness 58 yo male s/p L DA-THA on 03/02/19. PMH includes hep C, AKI, CKD III, COPD, AVN R and L hip with history of R DA-THA 2017, alcohol abuse, anxiety.    PT Comments    Pt ambulated in hallway and performed LE exercises.  Pt reports plan to d/c home tomorrow.  Follow Up Recommendations  Follow surgeon's recommendation for DC plan and follow-up therapies;Supervision for mobility/OOB     Equipment Recommendations  Rolling walker with 5" wheels    Recommendations for Other Services       Precautions / Restrictions Precautions Precautions: Fall Restrictions Other Position/Activity Restrictions: WBAT    Mobility  Bed Mobility Overal bed mobility: Needs Assistance Bed Mobility: Supine to Sit     Supine to sit: Min guard;HOB elevated     General bed mobility comments: verbal cues for technique, increased time and effort  Transfers Overall transfer level: Needs assistance Equipment used: Rolling walker (2 wheeled) Transfers: Sit to/from Stand Sit to Stand: Min guard;From elevated surface         General transfer comment: verbal cues for UE and LE positioning for safety and pain control  Ambulation/Gait Ambulation/Gait assistance: Min guard Gait Distance (Feet): 200 Feet Assistive device: Rolling walker (2 wheeled) Gait Pattern/deviations: Step-to pattern;Trunk flexed;Decreased stance time - left;Antalgic Gait velocity: decr   General Gait Details: verbal cues for sequence, step length, RW positioning, posture   Stairs             Wheelchair Mobility    Modified Rankin (Stroke Patients Only)       Balance                                            Cognition Arousal/Alertness: Awake/alert Behavior During Therapy: WFL for tasks assessed/performed Overall Cognitive Status: Within  Functional Limits for tasks assessed                                        Exercises Total Joint Exercises Ankle Circles/Pumps: AROM;10 reps;Both Quad Sets: AROM;Both;10 reps Short Arc Quad: AROM;Left;10 reps Heel Slides: AAROM;Left;10 reps Hip ABduction/ADduction: AROM;AAROM;Left;10 reps Long Arc Quad: 10 reps;AROM;Left Knee Flexion: AROM;Standing;10 reps;Left Marching in Standing: AROM;Left;10 reps;Standing Standing Hip Extension: AROM;Left;Standing;10 reps    General Comments        Pertinent Vitals/Pain Pain Assessment: 0-10 Pain Score: 5  Pain Location: L hip Pain Descriptors / Indicators: Sore;Aching Pain Intervention(s): Limited activity within patient's tolerance;Monitored during session;Repositioned    Home Living                      Prior Function            PT Goals (current goals can now be found in the care plan section) Progress towards PT goals: Progressing toward goals    Frequency    7X/week      PT Plan Current plan remains appropriate    Co-evaluation              AM-PAC PT "6 Clicks" Mobility   Outcome Measure  Help needed turning from your back to your side while in a flat bed without using bedrails?:  A Little Help needed moving from lying on your back to sitting on the side of a flat bed without using bedrails?: A Little Help needed moving to and from a bed to a chair (including a wheelchair)?: A Little Help needed standing up from a chair using your arms (e.g., wheelchair or bedside chair)?: A Little Help needed to walk in hospital room?: A Little Help needed climbing 3-5 steps with a railing? : A Lot 6 Click Score: 17    End of Session Equipment Utilized During Treatment: Gait belt Activity Tolerance: Patient tolerated treatment well Patient left: in chair;with call bell/phone within reach;with chair alarm set Nurse Communication: Mobility status PT Visit Diagnosis: Other abnormalities of gait and  mobility (R26.89);Difficulty in walking, not elsewhere classified (R26.2)     Time: 6861-6837 PT Time Calculation (min) (ACUTE ONLY): 20 min  Charges:  $Therapeutic Exercise: 8-22 mins                     Carmelia Bake, PT, DPT Acute Rehabilitation Services Office: [phone] Pager: [phone]   Trena Platt 03/03/2019, 1:23 PM

## 2019-03-03 NOTE — Progress Notes (Signed)
Subjective: 1 Day Post-Op Procedure(s) (LRB): LEFT TOTAL HIP ARTHROPLASTY ANTERIOR APPROACH (Left) Patient reports pain as moderate.    Objective: Vital signs in last 24 hours: Temp:  [97.5 F (36.4 C)-98.5 F (36.9 C)] 97.6 F (36.4 C) (06/27 0455) Pulse Rate:  [98-116] 102 (06/27 0455) Resp:  [15-24] 16 (06/27 0455) BP: (123-163)/(79-111) 123/79 (06/27 0455) SpO2:  [90 %-100 %] 99 % (06/27 0455) Weight:  [659 kg] 124 kg (06/26 1255)  Intake/Output from previous day: 06/26 0701 - 06/27 0700 In: 3032.2 [P.O.:480; I.V.:2502.2; IV Piggyback:50] Out: 9357 [SVXBL:3903; Blood:300] Intake/Output this shift: Total I/O In: 118 [P.O.:118] Out: 400 [Urine:400]  Recent Labs    03/03/19 0258  HGB 11.9*   Recent Labs    03/03/19 0258  WBC 14.5*  RBC 4.30  HCT 38.8*  PLT 286   Recent Labs    03/03/19 0258  NA 138  K 4.1  CL 105  CO2 21*  BUN 21*  CREATININE 1.64*  GLUCOSE 131*  CALCIUM 9.1   No results for input(s): LABPT, INR in the last 72 hours.  Sensation intact distally Intact pulses distally Dorsiflexion/Plantar flexion intact Incision: dressing C/D/I   Assessment/Plan: 1 Day Post-Op Procedure(s) (LRB): LEFT TOTAL HIP ARTHROPLASTY ANTERIOR APPROACH (Left) Up with therapy Plan for discharge tomorrow Discharge home with home health   Anticipated LOS equal to or greater than 2 midnights due to - Age 59 and older with one or more of the following:  - Obesity  - Expected need for hospital services (PT, OT, Nursing) required for safe  discharge  - Anticipated need for postoperative skilled nursing care or inpatient rehab  - Active co-morbidities: None OR   - Unanticipated findings during/Post Surgery: None  - Patient is a high risk of re-admission due to: None    Mcarthur Rossetti 03/03/2019, 9:09 AM

## 2019-03-04 MED ORDER — OXYCODONE HCL 5 MG PO TABS: 5.0000 mg | ORAL_TABLET | Freq: Four times a day (QID) | ORAL | 0 refills | Status: DC | PRN

## 2019-03-04 MED ORDER — METHOCARBAMOL 500 MG PO TABS: 500.0000 mg | ORAL_TABLET | Freq: Four times a day (QID) | ORAL | 1 refills | Status: DC | PRN

## 2019-03-04 MED ORDER — ASPIRIN 81 MG PO CHEW: 81.0000 mg | CHEWABLE_TABLET | Freq: Two times a day (BID) | ORAL | 0 refills | Status: DC

## 2019-03-04 NOTE — Discharge Summary (Signed)
Patient ID: Mathew Lara MRN: [id number] DOB/AGE: 13-Dec-1960 58 y.o.  Admit date: 03/02/2019 Discharge date: 03/04/2019  Admission Diagnoses:  Principal Problem:   Avascular necrosis of bone of left hip ([place]) Active Problems:   Status post total replacement of left hip   Discharge Diagnoses:  Same  Past Medical History:  Diagnosis Date  . Alcohol abuse   . Anxiety   . Bronchitis   . Chronic hip pain   . Chronic kidney disease (CKD) stage G3b/A3, moderately decreased glomerular filtration rate (GFR) between 30-44 mL/min/1.73 square meter and albuminuria creatinine ratio greater than 300 mg/g ([place]) 01/03/2018  . COPD (chronic obstructive pulmonary disease) ([place])   . Hepatitis C   . Hypertension   . Mixed hyperlipidemia   . Renal disorder    states renal dysfunction, "strained kidney"  . Sleep apnea     Surgeries: Procedure(s): LEFT TOTAL HIP ARTHROPLASTY ANTERIOR APPROACH on 03/02/2019   Consultants:   Discharged Condition: Improved  Hospital Course: Mathew Lara is an 58 y.o. male who was admitted 03/02/2019 for operative treatment ofAvascular necrosis of bone of left hip ([place]). Patient has severe unremitting pain that affects sleep, daily activities, and work/hobbies. After pre-op clearance the patient was taken to the operating room on 03/02/2019 and underwent  Procedure(s): LEFT TOTAL HIP ARTHROPLASTY ANTERIOR APPROACH.    Patient was given perioperative antibiotics:  Anti-infectives (From admission, onward)   Start     Dose/Rate Route Frequency Ordered Stop   03/02/19 1600  ceFAZolin (ANCEF) IVPB 2g/100 mL premix     2 g 200 mL/hr over 30 Minutes Intravenous Every 6 hours 03/02/19 1304 03/02/19 2234   03/02/19 0600  ceFAZolin (ANCEF) 3 g in dextrose 5 % 50 mL IVPB     3 g 100 mL/hr over 30 Minutes Intravenous On call to O.R. 03/01/19 7829 03/02/19 1003       Patient was given sequential compression devices, early ambulation, and chemoprophylaxis to prevent  DVT.  Patient benefited maximally from hospital stay and there were no complications.    Recent vital signs:  Patient Vitals for the past 24 hrs:  BP Temp Temp src Pulse Resp SpO2  03/04/19 0609 (!) 123/95 98.7 F (37.1 C) Oral (!) 105 17 94 %  03/03/19 2209 (!) 157/106 97.7 F (36.5 C) Oral (!) 102 18 94 %  03/03/19 2040 - - - - 20 -  03/03/19 1809 (!) 130/98 97.6 F (36.4 C) Oral 98 20 95 %  03/03/19 1421 (!) 128/91 (!) 97.5 F (36.4 C) Oral (!) 106 - 94 %  03/03/19 1042 (!) 133/98 98 F (36.7 C) Oral (!) 102 - 96 %     Recent laboratory studies:  Recent Labs    03/03/19 0258  WBC 14.5*  HGB 11.9*  HCT 38.8*  PLT 286  NA 138  K 4.1  CL 105  CO2 21*  BUN 21*  CREATININE 1.64*  GLUCOSE 131*  CALCIUM 9.1     Discharge Medications:   Allergies as of 03/04/2019   No Known Allergies     Medication List    STOP taking these medications   aspirin EC 81 MG tablet Replaced by: aspirin 81 MG chewable tablet   HYDROcodone-acetaminophen 5-325 MG tablet Commonly known as: NORCO/VICODIN     TAKE these medications   amLODipine 10 MG tablet Commonly known as: NORVASC Take 10 mg by mouth daily.   aspirin 81 MG chewable tablet Chew 1 tablet (81 mg total) by mouth 2 (two)  times daily. Replaces: aspirin EC 81 MG tablet   fenofibrate 160 MG tablet Take 160 mg by mouth daily.   loratadine 10 MG tablet Commonly known as: CLARITIN Take 10 mg by mouth daily.   methocarbamol 500 MG tablet Commonly known as: ROBAXIN Take 1 tablet (500 mg total) by mouth every 6 (six) hours as needed for muscle spasms.   oxyCODONE 5 MG immediate release tablet Commonly known as: Oxy IR/ROXICODONE Take 1-2 tablets (5-10 mg total) by mouth every 6 (six) hours as needed for moderate pain (pain score 4-6).   ProAir HFA 108 (90 Base) MCG/ACT inhaler Generic drug: albuterol Inhale 2 puffs into the lungs every 6 (six) hours as needed for wheezing or shortness of breath.   Vitamin D  (Ergocalciferol) 1.25 MG (50000 UT) Caps capsule Commonly known as: DRISDOL Take 50,000 Units by mouth once a week.            Durable Medical Equipment  (From admission, onward)         Start     Ordered   03/02/19 1305  DME Walker rolling  Once    Question:  Patient needs a walker to treat with the following condition  Answer:  Status post total replacement of left hip   03/02/19 1304   03/02/19 1305  DME 3 n 1  Once     03/02/19 1304          Diagnostic Studies: Dg Pelvis Portable  Result Date: 03/02/2019 CLINICAL DATA:  Total left hip replacement. EXAM: PORTABLE PELVIS 1-2 VIEWS COMPARISON:  11/21/2018. FINDINGS: Total left hip replacement. Hardware intact. Anatomic alignment. No acute bony abnormality. Prior right hip replacement. IMPRESSION: Total left hip replacement anatomic alignment. Electronically Signed   By: Marcello Moores  Register   On: 03/02/2019 12:28    Disposition: Discharge disposition: 01-Home or Self Care         Follow-up Information    Mcarthur Rossetti, MD. Schedule an appointment as soon as possible for a visit in 2 week(s).   Specialty: Orthopedic Surgery Contact information: [place] [address] [phone]        [facility].   Specialty: [place] Why: agency will provide home health physical therapy, agency will call you to schedule first visit Contact information: [place] [address] [phone]            Signed: Mcarthur Rossetti 03/04/2019, 9:06 AM

## 2019-03-04 NOTE — Progress Notes (Signed)
Patient ID: Mathew Lara, male   DOB: 08-28-1961, 58 y.o.   MRN: [id number] Doing well overall. Vitals stable.  Left hip stable.  Can be discharged to home this afternoon.

## 2019-03-04 NOTE — Progress Notes (Signed)
Physical Therapy Treatment Patient Details Name: Mathew Lara MRN: [id number] DOB: 05-02-1961 Today's Date: 03/04/2019    History of Present Illness 58 yo male s/p L DA-THA on 03/02/19. PMH includes hep C, AKI, CKD III, COPD, AVN R and L hip with history of R DA-THA 2017, alcohol abuse, anxiety.    PT Comments    Pt progressing well. Reviewed gait/safety, practiced stairs--pt able to to return demo without difficulty, familiar with techniques from previous surgery. Ready for d/c from PT standpoint.  Follow Up Recommendations  Follow surgeon's recommendation for DC plan and follow-up therapies;Supervision for mobility/OOB     Equipment Recommendations  Rolling walker with 5" wheels    Recommendations for Other Services       Precautions / Restrictions Precautions Precautions: Fall Restrictions Weight Bearing Restrictions: No Other Position/Activity Restrictions: WBAT    Mobility  Bed Mobility               General bed mobility comments: pt up in recliner on arrival  Transfers Overall transfer level: Modified independent                  Ambulation/Gait Ambulation/Gait assistance: Supervision;Modified independent (Device/Increase time) Gait Distance (Feet): 210 Feet Assistive device: Rolling walker (2 wheeled) Gait Pattern/deviations: Step-to pattern;Step-through pattern     General Gait Details: verbal cues for sequence, step length, RW positioning, posture--pt able to progress to step through gait  second half of distance    Stairs Stairs: Yes   Stair Management: One rail Right;One rail Left;Step to pattern;Sideways Number of Stairs: 5 General stair comments: cues for sequence and safety   Wheelchair Mobility    Modified Rankin (Stroke Patients Only)       Balance                                            Cognition Arousal/Alertness: Awake/alert Behavior During Therapy: WFL for tasks assessed/performed Overall  Cognitive Status: Within Functional Limits for tasks assessed                                        Exercises      General Comments        Pertinent Vitals/Pain Pain Assessment: 0-10 Pain Score: 4  Pain Location: L hip Pain Descriptors / Indicators: Sore;Aching Pain Intervention(s): Limited activity within patient's tolerance;Monitored during session    Home Living                      Prior Function            PT Goals (current goals can now be found in the care plan section) Acute Rehab PT Goals Patient Stated Goal: go home to wife PT Goal Formulation: With patient Time For Goal Achievement: 03/09/19 Potential to Achieve Goals: Good Progress towards PT goals: Progressing toward goals    Frequency    7X/week      PT Plan Current plan remains appropriate    Co-evaluation              AM-PAC PT "6 Clicks" Mobility   Outcome Measure  Help needed turning from your back to your side while in a flat bed without using bedrails?: A Little Help needed moving from lying on your back to sitting  on the side of a flat bed without using bedrails?: A Little Help needed moving to and from a bed to a chair (including a wheelchair)?: A Little Help needed standing up from a chair using your arms (e.g., wheelchair or bedside chair)?: A Little Help needed to walk in hospital room?: A Little Help needed climbing 3-5 steps with a railing? : A Little 6 Click Score: 18    End of Session   Activity Tolerance: Patient tolerated treatment well Patient left: in chair;with call bell/phone within reach;with chair alarm set Nurse Communication: Mobility status PT Visit Diagnosis: Other abnormalities of gait and mobility (R26.89);Difficulty in walking, not elsewhere classified (R26.2)     Time: 4353-9122 PT Time Calculation (min) (ACUTE ONLY): 16 min  Charges:  $Gait Training: 8-22 mins                     Kenyon Ana, PT  Pager:  [phone] Acute Rehab Dept [facility]): [phone]   03/04/2019    [facility] 03/04/2019, 12:51 PM

## 2019-03-05 ENCOUNTER — Encounter (HOSPITAL_COMMUNITY): Payer: Self-pay | Admitting: Orthopaedic Surgery

## 2019-03-06 ENCOUNTER — Telehealth: Payer: Self-pay

## 2019-03-06 NOTE — Telephone Encounter (Signed)
Just an Delight Hoh from [facility] called and wanted to let Dr. Ninfa Linden know that they will be seeing patient tomorrow. S/P L THA    CB: [phone]

## 2019-03-07 DIAGNOSIS — G4730 Sleep apnea, unspecified: Secondary | ICD-10-CM | POA: Diagnosis not present

## 2019-03-07 DIAGNOSIS — N183 Chronic kidney disease, stage 3 (moderate): Secondary | ICD-10-CM | POA: Diagnosis not present

## 2019-03-07 DIAGNOSIS — Z9181 History of falling: Secondary | ICD-10-CM | POA: Diagnosis not present

## 2019-03-07 DIAGNOSIS — B1920 Unspecified viral hepatitis C without hepatic coma: Secondary | ICD-10-CM | POA: Diagnosis not present

## 2019-03-07 DIAGNOSIS — J449 Chronic obstructive pulmonary disease, unspecified: Secondary | ICD-10-CM | POA: Diagnosis not present

## 2019-03-07 DIAGNOSIS — Z96643 Presence of artificial hip joint, bilateral: Secondary | ICD-10-CM | POA: Diagnosis not present

## 2019-03-07 DIAGNOSIS — Z6841 Body Mass Index (BMI) 40.0 and over, adult: Secondary | ICD-10-CM | POA: Diagnosis not present

## 2019-03-07 DIAGNOSIS — Z471 Aftercare following joint replacement surgery: Secondary | ICD-10-CM | POA: Diagnosis not present

## 2019-03-07 DIAGNOSIS — E669 Obesity, unspecified: Secondary | ICD-10-CM | POA: Diagnosis not present

## 2019-03-07 DIAGNOSIS — Z87891 Personal history of nicotine dependence: Secondary | ICD-10-CM | POA: Diagnosis not present

## 2019-03-07 DIAGNOSIS — I129 Hypertensive chronic kidney disease with stage 1 through stage 4 chronic kidney disease, or unspecified chronic kidney disease: Secondary | ICD-10-CM | POA: Diagnosis not present

## 2019-03-07 DIAGNOSIS — F419 Anxiety disorder, unspecified: Secondary | ICD-10-CM | POA: Diagnosis not present

## 2019-03-08 ENCOUNTER — Telehealth: Payer: Self-pay | Admitting: Orthopaedic Surgery

## 2019-03-08 NOTE — Telephone Encounter (Signed)
Joey with [facility] called in requesting verbal orders for pt for 1 time a week for 1 week and 2 times a week for 2 weeks.   [phone]

## 2019-03-08 NOTE — Telephone Encounter (Signed)
Verbal order left on voicemail.  

## 2019-03-13 DIAGNOSIS — J449 Chronic obstructive pulmonary disease, unspecified: Secondary | ICD-10-CM | POA: Diagnosis not present

## 2019-03-13 DIAGNOSIS — I129 Hypertensive chronic kidney disease with stage 1 through stage 4 chronic kidney disease, or unspecified chronic kidney disease: Secondary | ICD-10-CM | POA: Diagnosis not present

## 2019-03-13 DIAGNOSIS — Z96643 Presence of artificial hip joint, bilateral: Secondary | ICD-10-CM | POA: Diagnosis not present

## 2019-03-13 DIAGNOSIS — Z6841 Body Mass Index (BMI) 40.0 and over, adult: Secondary | ICD-10-CM | POA: Diagnosis not present

## 2019-03-13 DIAGNOSIS — B1920 Unspecified viral hepatitis C without hepatic coma: Secondary | ICD-10-CM | POA: Diagnosis not present

## 2019-03-13 DIAGNOSIS — Z471 Aftercare following joint replacement surgery: Secondary | ICD-10-CM | POA: Diagnosis not present

## 2019-03-13 DIAGNOSIS — Z87891 Personal history of nicotine dependence: Secondary | ICD-10-CM | POA: Diagnosis not present

## 2019-03-13 DIAGNOSIS — F419 Anxiety disorder, unspecified: Secondary | ICD-10-CM | POA: Diagnosis not present

## 2019-03-13 DIAGNOSIS — Z9181 History of falling: Secondary | ICD-10-CM | POA: Diagnosis not present

## 2019-03-13 DIAGNOSIS — N183 Chronic kidney disease, stage 3 (moderate): Secondary | ICD-10-CM | POA: Diagnosis not present

## 2019-03-13 DIAGNOSIS — E669 Obesity, unspecified: Secondary | ICD-10-CM | POA: Diagnosis not present

## 2019-03-13 DIAGNOSIS — G4730 Sleep apnea, unspecified: Secondary | ICD-10-CM | POA: Diagnosis not present

## 2019-03-15 ENCOUNTER — Telehealth: Payer: Self-pay | Admitting: Orthopaedic Surgery

## 2019-03-15 DIAGNOSIS — E669 Obesity, unspecified: Secondary | ICD-10-CM | POA: Diagnosis not present

## 2019-03-15 DIAGNOSIS — B1920 Unspecified viral hepatitis C without hepatic coma: Secondary | ICD-10-CM | POA: Diagnosis not present

## 2019-03-15 DIAGNOSIS — Z9181 History of falling: Secondary | ICD-10-CM | POA: Diagnosis not present

## 2019-03-15 DIAGNOSIS — Z6841 Body Mass Index (BMI) 40.0 and over, adult: Secondary | ICD-10-CM | POA: Diagnosis not present

## 2019-03-15 DIAGNOSIS — I129 Hypertensive chronic kidney disease with stage 1 through stage 4 chronic kidney disease, or unspecified chronic kidney disease: Secondary | ICD-10-CM | POA: Diagnosis not present

## 2019-03-15 DIAGNOSIS — G4730 Sleep apnea, unspecified: Secondary | ICD-10-CM | POA: Diagnosis not present

## 2019-03-15 DIAGNOSIS — F419 Anxiety disorder, unspecified: Secondary | ICD-10-CM | POA: Diagnosis not present

## 2019-03-15 DIAGNOSIS — N183 Chronic kidney disease, stage 3 (moderate): Secondary | ICD-10-CM | POA: Diagnosis not present

## 2019-03-15 DIAGNOSIS — Z96643 Presence of artificial hip joint, bilateral: Secondary | ICD-10-CM | POA: Diagnosis not present

## 2019-03-15 DIAGNOSIS — J449 Chronic obstructive pulmonary disease, unspecified: Secondary | ICD-10-CM | POA: Diagnosis not present

## 2019-03-15 DIAGNOSIS — Z87891 Personal history of nicotine dependence: Secondary | ICD-10-CM | POA: Diagnosis not present

## 2019-03-15 DIAGNOSIS — Z471 Aftercare following joint replacement surgery: Secondary | ICD-10-CM | POA: Diagnosis not present

## 2019-03-15 MED ORDER — OXYCODONE HCL 5 MG PO TABS: 5.0000 mg | ORAL_TABLET | Freq: Four times a day (QID) | ORAL | 0 refills | Status: DC | PRN

## 2019-03-15 NOTE — Telephone Encounter (Signed)
I will send some in.

## 2019-03-15 NOTE — Telephone Encounter (Signed)
Patient called left voicemail message that he has 10 pain pills left and do not think they will last until his appointment. The number to contact patient is [phone]

## 2019-03-15 NOTE — Telephone Encounter (Signed)
Please advise 

## 2019-03-19 ENCOUNTER — Ambulatory Visit (INDEPENDENT_AMBULATORY_CARE_PROVIDER_SITE_OTHER): Payer: BC Managed Care – PPO | Admitting: Orthopaedic Surgery

## 2019-03-19 ENCOUNTER — Other Ambulatory Visit: Payer: Self-pay | Admitting: Orthopaedic Surgery

## 2019-03-19 ENCOUNTER — Other Ambulatory Visit: Payer: Self-pay

## 2019-03-19 ENCOUNTER — Encounter: Payer: Self-pay | Admitting: Orthopaedic Surgery

## 2019-03-19 DIAGNOSIS — Z96642 Presence of left artificial hip joint: Secondary | ICD-10-CM

## 2019-03-19 MED ORDER — OXYCODONE HCL 5 MG PO TABS: 5.0000 mg | ORAL_TABLET | Freq: Four times a day (QID) | ORAL | 0 refills | Status: DC | PRN

## 2019-03-19 NOTE — Progress Notes (Signed)
HPI: [name] returns today status post left total hip arthroplasty.  He is now 17 days postop.  He denies any chest pain shortness of breath calf pain fevers or chills.  He feels that his range of motion and strength are reviewed.  He is ambulating with a cane.  Review of systems: See HPI otherwise negative  Physical exam: Left hip incision is well approximated with staples no signs of infection.  Small seroma.  Left calf supple nontender dorsiflexion plantarflexion of the left foot intact.  Ambulates with a cane with slight antalgic gait.  Impression: Status post left total hip arthroplasty  Plan: Staples removed Steri-Strips applied he is able to get the incision wet.  Discussed with him wound care and scar tissue mobilization.  She will follow-up with [place] in 1 month sooner if there is any questions or concerns.  He will go back on his 81 mg aspirin that he was on prior to surgery.  Refill on his oxycodone was given today.  Questions encouraged and answered.  He can return sooner if he feels that the hip is getting tight and we can definitely drain his seroma.

## 2019-03-20 DIAGNOSIS — B1920 Unspecified viral hepatitis C without hepatic coma: Secondary | ICD-10-CM | POA: Diagnosis not present

## 2019-03-20 DIAGNOSIS — E669 Obesity, unspecified: Secondary | ICD-10-CM | POA: Diagnosis not present

## 2019-03-20 DIAGNOSIS — Z471 Aftercare following joint replacement surgery: Secondary | ICD-10-CM | POA: Diagnosis not present

## 2019-03-20 DIAGNOSIS — Z87891 Personal history of nicotine dependence: Secondary | ICD-10-CM | POA: Diagnosis not present

## 2019-03-20 DIAGNOSIS — Z6841 Body Mass Index (BMI) 40.0 and over, adult: Secondary | ICD-10-CM | POA: Diagnosis not present

## 2019-03-20 DIAGNOSIS — N183 Chronic kidney disease, stage 3 (moderate): Secondary | ICD-10-CM | POA: Diagnosis not present

## 2019-03-20 DIAGNOSIS — J449 Chronic obstructive pulmonary disease, unspecified: Secondary | ICD-10-CM | POA: Diagnosis not present

## 2019-03-20 DIAGNOSIS — Z9181 History of falling: Secondary | ICD-10-CM | POA: Diagnosis not present

## 2019-03-20 DIAGNOSIS — F419 Anxiety disorder, unspecified: Secondary | ICD-10-CM | POA: Diagnosis not present

## 2019-03-20 DIAGNOSIS — G4730 Sleep apnea, unspecified: Secondary | ICD-10-CM | POA: Diagnosis not present

## 2019-03-20 DIAGNOSIS — I129 Hypertensive chronic kidney disease with stage 1 through stage 4 chronic kidney disease, or unspecified chronic kidney disease: Secondary | ICD-10-CM | POA: Diagnosis not present

## 2019-03-20 DIAGNOSIS — Z96643 Presence of artificial hip joint, bilateral: Secondary | ICD-10-CM | POA: Diagnosis not present

## 2019-03-23 DIAGNOSIS — Z6841 Body Mass Index (BMI) 40.0 and over, adult: Secondary | ICD-10-CM | POA: Diagnosis not present

## 2019-03-23 DIAGNOSIS — N183 Chronic kidney disease, stage 3 (moderate): Secondary | ICD-10-CM | POA: Diagnosis not present

## 2019-03-23 DIAGNOSIS — Z471 Aftercare following joint replacement surgery: Secondary | ICD-10-CM | POA: Diagnosis not present

## 2019-03-23 DIAGNOSIS — Z9181 History of falling: Secondary | ICD-10-CM | POA: Diagnosis not present

## 2019-03-23 DIAGNOSIS — J449 Chronic obstructive pulmonary disease, unspecified: Secondary | ICD-10-CM | POA: Diagnosis not present

## 2019-03-23 DIAGNOSIS — Z87891 Personal history of nicotine dependence: Secondary | ICD-10-CM | POA: Diagnosis not present

## 2019-03-23 DIAGNOSIS — F419 Anxiety disorder, unspecified: Secondary | ICD-10-CM | POA: Diagnosis not present

## 2019-03-23 DIAGNOSIS — G4730 Sleep apnea, unspecified: Secondary | ICD-10-CM | POA: Diagnosis not present

## 2019-03-23 DIAGNOSIS — I129 Hypertensive chronic kidney disease with stage 1 through stage 4 chronic kidney disease, or unspecified chronic kidney disease: Secondary | ICD-10-CM | POA: Diagnosis not present

## 2019-03-23 DIAGNOSIS — B1920 Unspecified viral hepatitis C without hepatic coma: Secondary | ICD-10-CM | POA: Diagnosis not present

## 2019-03-23 DIAGNOSIS — Z96643 Presence of artificial hip joint, bilateral: Secondary | ICD-10-CM | POA: Diagnosis not present

## 2019-03-23 DIAGNOSIS — E669 Obesity, unspecified: Secondary | ICD-10-CM | POA: Diagnosis not present

## 2019-03-28 IMAGING — DX DG CHEST 2V
2 series · 2 of 2 positions shown · non-contrast
Comparison: 03/08/2018

CLINICAL DATA: Cough and shortness of breath. Rash. History of
COPD.

EXAM:
CHEST - 2 VIEW

[chest pa]
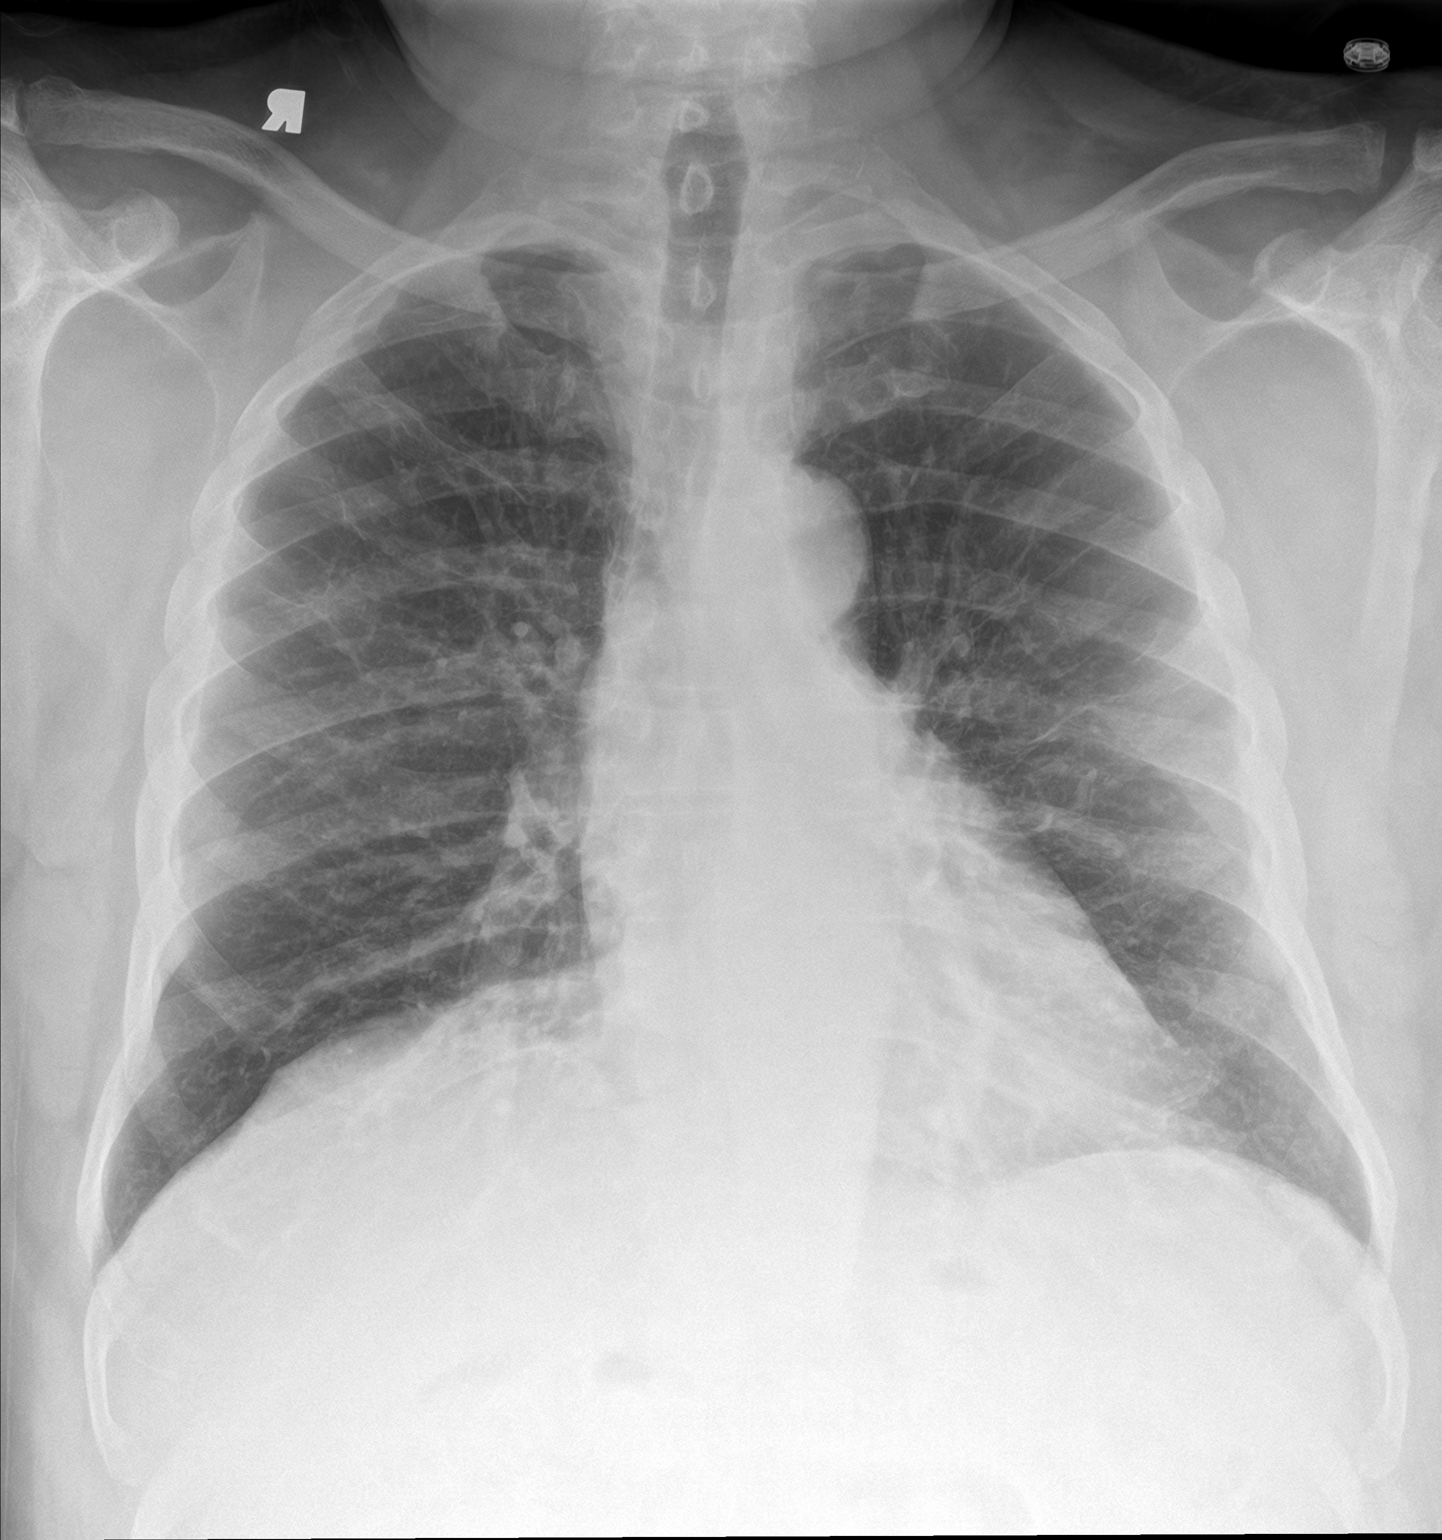

[chest lat]
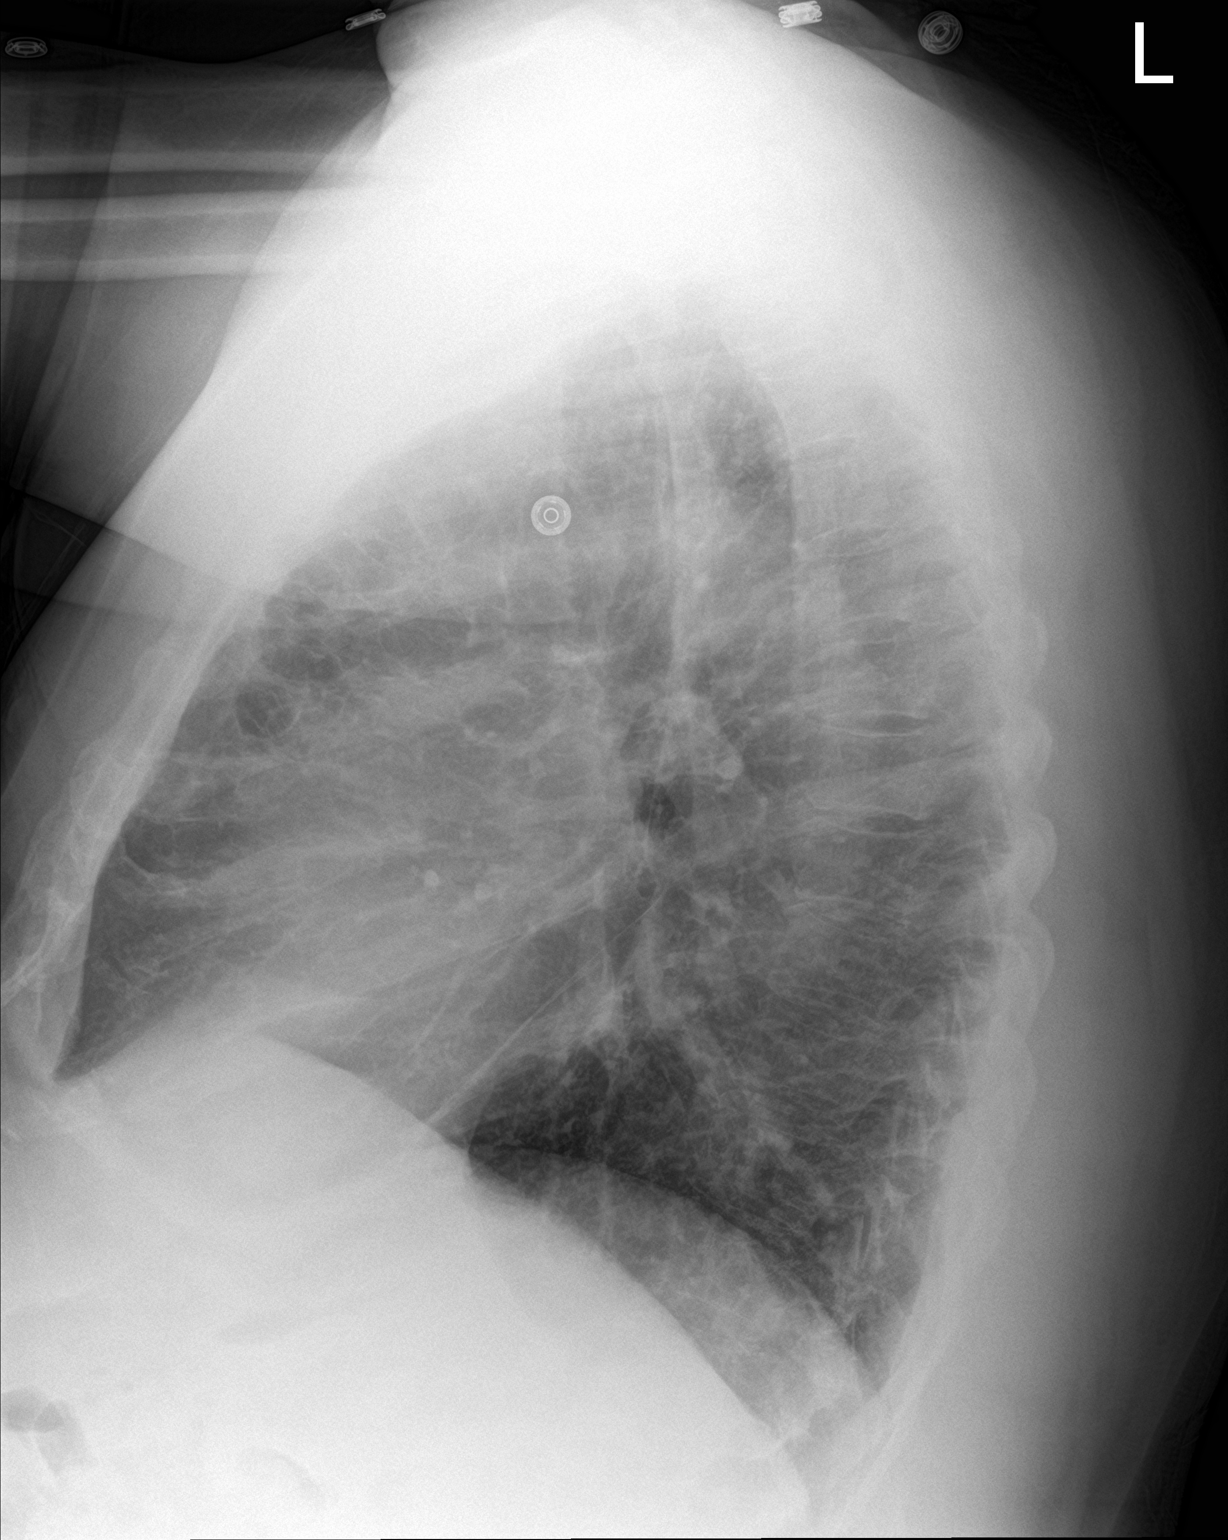

[2 of 2 positions shown; findings below may reference images not displayed]

FINDINGS: The cardiomediastinal silhouette is unchanged and within normal
limits. Chronic bronchitic changes are similar to the prior study.
No confluent airspace opacity, edema, pleural effusion, or
pneumothorax is identified. No acute osseous abnormality is seen.
IMPRESSION: No active cardiopulmonary disease.

## 2019-04-16 ENCOUNTER — Ambulatory Visit (INDEPENDENT_AMBULATORY_CARE_PROVIDER_SITE_OTHER): Payer: BC Managed Care – PPO | Admitting: Orthopaedic Surgery

## 2019-04-16 ENCOUNTER — Encounter: Payer: Self-pay | Admitting: Orthopaedic Surgery

## 2019-04-16 DIAGNOSIS — Z96642 Presence of left artificial hip joint: Secondary | ICD-10-CM

## 2019-04-16 MED ORDER — METHYLPREDNISOLONE 4 MG PO TABS: ORAL_TABLET | ORAL | 0 refills | Status: DC

## 2019-04-16 MED ORDER — TIZANIDINE HCL 4 MG PO TABS: 4.0000 mg | ORAL_TABLET | Freq: Three times a day (TID) | ORAL | 0 refills | Status: DC | PRN

## 2019-04-16 NOTE — Progress Notes (Signed)
The patient is now 45 days status post a left total hip arthroplasty.  We have already replaced his right hip as well.  He is ambulate with a cane and doing well overall.  He is been experiencing some low back pain.  He does feel ready to return to work September 1 of this year.  On exam both hips move fluidly and smoothly.  He is ambulate with a cane.  He has no seroma now on the left side and his incisions healed nicely.  He does have low back pain with flexion extension.  I will send in a muscle relaxant for that and a 6-day steroid taper.  From my standpoint we do not need to see him back for 3 months.  At that visit I would like just a low AP pelvis.

## 2019-04-24 DIAGNOSIS — J449 Chronic obstructive pulmonary disease, unspecified: Secondary | ICD-10-CM | POA: Diagnosis not present

## 2019-04-24 DIAGNOSIS — Z6841 Body Mass Index (BMI) 40.0 and over, adult: Secondary | ICD-10-CM | POA: Diagnosis not present

## 2019-04-24 DIAGNOSIS — G894 Chronic pain syndrome: Secondary | ICD-10-CM | POA: Diagnosis not present

## 2019-04-24 DIAGNOSIS — J209 Acute bronchitis, unspecified: Secondary | ICD-10-CM | POA: Diagnosis not present

## 2019-05-09 ENCOUNTER — Encounter: Payer: Self-pay | Admitting: Gastroenterology

## 2019-06-12 DIAGNOSIS — G894 Chronic pain syndrome: Secondary | ICD-10-CM | POA: Diagnosis not present

## 2019-06-12 DIAGNOSIS — Z681 Body mass index (BMI) 19 or less, adult: Secondary | ICD-10-CM | POA: Diagnosis not present

## 2019-06-12 DIAGNOSIS — J449 Chronic obstructive pulmonary disease, unspecified: Secondary | ICD-10-CM | POA: Diagnosis not present

## 2019-07-16 DIAGNOSIS — Z6841 Body Mass Index (BMI) 40.0 and over, adult: Secondary | ICD-10-CM | POA: Diagnosis not present

## 2019-07-16 DIAGNOSIS — G894 Chronic pain syndrome: Secondary | ICD-10-CM | POA: Diagnosis not present

## 2019-07-16 DIAGNOSIS — J302 Other seasonal allergic rhinitis: Secondary | ICD-10-CM | POA: Diagnosis not present

## 2019-07-16 DIAGNOSIS — Z23 Encounter for immunization: Secondary | ICD-10-CM | POA: Diagnosis not present

## 2019-08-07 ENCOUNTER — Encounter (HOSPITAL_COMMUNITY): Payer: Self-pay | Admitting: *Deleted

## 2019-08-07 ENCOUNTER — Other Ambulatory Visit: Payer: Self-pay

## 2019-08-07 ENCOUNTER — Emergency Department (HOSPITAL_COMMUNITY)
Admission: EM | Admit: 2019-08-07 | Discharge: 2019-08-07 | Disposition: A | Discharge status: Home / Self Care | Payer: BC Managed Care – PPO | Attending: Emergency Medicine | Admitting: Emergency Medicine

## 2019-08-07 DIAGNOSIS — R945 Abnormal results of liver function studies: Secondary | ICD-10-CM | POA: Insufficient documentation

## 2019-08-07 DIAGNOSIS — J449 Chronic obstructive pulmonary disease, unspecified: Secondary | ICD-10-CM | POA: Insufficient documentation

## 2019-08-07 DIAGNOSIS — I129 Hypertensive chronic kidney disease with stage 1 through stage 4 chronic kidney disease, or unspecified chronic kidney disease: Secondary | ICD-10-CM | POA: Diagnosis not present

## 2019-08-07 DIAGNOSIS — N183 Chronic kidney disease, stage 3 unspecified: Secondary | ICD-10-CM | POA: Insufficient documentation

## 2019-08-07 DIAGNOSIS — Z7982 Long term (current) use of aspirin: Secondary | ICD-10-CM | POA: Insufficient documentation

## 2019-08-07 DIAGNOSIS — Z96642 Presence of left artificial hip joint: Secondary | ICD-10-CM | POA: Insufficient documentation

## 2019-08-07 DIAGNOSIS — Z96641 Presence of right artificial hip joint: Secondary | ICD-10-CM | POA: Diagnosis not present

## 2019-08-07 DIAGNOSIS — I10 Essential (primary) hypertension: Secondary | ICD-10-CM

## 2019-08-07 DIAGNOSIS — R7989 Other specified abnormal findings of blood chemistry: Secondary | ICD-10-CM | POA: Diagnosis not present

## 2019-08-07 DIAGNOSIS — Z87891 Personal history of nicotine dependence: Secondary | ICD-10-CM | POA: Insufficient documentation

## 2019-08-07 DIAGNOSIS — R002 Palpitations: Secondary | ICD-10-CM | POA: Diagnosis not present

## 2019-08-07 DIAGNOSIS — Z79899 Other long term (current) drug therapy: Secondary | ICD-10-CM | POA: Diagnosis not present

## 2019-08-07 LAB — CBC
HCT: 42.2 % (ref 39.0–52.0)
Hemoglobin: 13.2 g/dL (ref 13.0–17.0)
MCH: 27.8 pg (ref 26.0–34.0)
MCHC: 31.3 g/dL (ref 30.0–36.0)
MCV: 88.8 fL (ref 80.0–100.0)
Platelets: 277 10*3/uL (ref 150–400)
RBC: 4.75 MIL/uL (ref 4.22–5.81)
RDW: 15.8 % — ABNORMAL HIGH (ref 11.5–15.5)
WBC: 9.2 10*3/uL (ref 4.0–10.5)
nRBC: 0 % (ref 0.0–0.2)

## 2019-08-07 LAB — COMPREHENSIVE METABOLIC PANEL
ALT: 63 U/L — ABNORMAL HIGH (ref 0–44)
AST: 45 U/L — ABNORMAL HIGH (ref 15–41)
Albumin: 3.7 g/dL (ref 3.5–5.0)
Alkaline Phosphatase: 99 U/L (ref 38–126)
Anion gap: 11 (ref 5–15)
BUN: 19 mg/dL (ref 6–20)
CO2: 23 mmol/L (ref 22–32)
Calcium: 9.6 mg/dL (ref 8.9–10.3)
Chloride: 102 mmol/L (ref 98–111)
Creatinine, Ser: 1.49 mg/dL — ABNORMAL HIGH (ref 0.61–1.24)
GFR calc Af Amer: 59 mL/min — ABNORMAL LOW (ref >60)
GFR calc non Af Amer: 51 mL/min — ABNORMAL LOW (ref >60)
Glucose, Bld: 104 mg/dL — ABNORMAL HIGH (ref 70–99)
Potassium: 3.9 mmol/L (ref 3.5–5.1)
Sodium: 136 mmol/L (ref 135–145)
Total Bilirubin: 0.9 mg/dL (ref 0.3–1.2)
Total Protein: 8.5 g/dL — ABNORMAL HIGH (ref 6.5–8.1)

## 2019-08-07 MED ORDER — AMLODIPINE BESYLATE 5 MG PO TABS
10.0000 mg | ORAL_TABLET | Freq: Once | ORAL | Status: AC
  Administered 2019-08-07: 10 mg via ORAL
  Filled 2019-08-07: qty 2

## 2019-08-07 NOTE — Discharge Instructions (Addendum)
It was our pleasure to provide your ER care today - we hope that you feel better.  Continue your blood pressure medication, limit salt intake, and follow up with your doctor in 1 week.   For palpitations, follow up with primary care doctor or cardiologist in coming week - discuss possible home heart rate monitor/Holter.   From today's lab tests, a couple of your liver function tests are mildly elevated (Ast 45, Alt 63) - hold your fenofibrate medication for now, and follow up your doctor.   Return to ER if worse, new symptoms, persistent fast heart rate, weak/faint, chest pain, trouble breathing, fever, or other concern.

## 2019-08-07 NOTE — ED Provider Notes (Addendum)
[facility] EMERGENCY DEPARTMENT Provider Note   CSN: [id number] Arrival date & time: 08/07/19  0418     History   Chief Complaint Chief Complaint  Patient presents with  . Palpitations    HPI Mathew Lara is a 58 y.o. male.     Patient with hx htn, states this PM, at rest, had sense of palpitations, as if heart was racing or flip-flopping. Lasted several seconds. Denies any pain with the episode. Denies any current or recent chest pain or discomfort of any sort. Denies sob or unusual doe. No syncope. Denies hx dysrhythmia. States compliant w normal meds, denies recent change in meds. Moderate caffeine use. Occasional etoh use. Denies cough or uri symptoms. No fever or chills. Normal appetite. No nvd. No gu c/o.   The history is provided by the patient.  Palpitations Associated symptoms: no back pain, no chest pain, no cough, no numbness, no shortness of breath, no vomiting and no weakness     Past Medical History:  Diagnosis Date  . Alcohol abuse   . Anxiety   . Bronchitis   . Chronic hip pain   . Chronic kidney disease (CKD) stage G3b/A3, moderately decreased glomerular filtration rate (GFR) between 30-44 mL/min/1.73 square meter and albuminuria creatinine ratio greater than 300 mg/g 01/03/2018  . COPD (chronic obstructive pulmonary disease) ([place])   . Hepatitis C   . Hypertension   . Mixed hyperlipidemia   . Renal disorder    states renal dysfunction, "strained kidney"  . Sleep apnea     Patient Active Problem List   Diagnosis Date Noted  . 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 Acute kidney injury ([place]) 03/26/2018  . Rash 03/26/2018  . Chest pain 03/08/2018  . CKD (chronic kidney disease), stage III 03/08/2018  . COPD (chronic obstructive pulmonary disease) ([place]) 03/08/2018  . Avascular necrosis of bone of left hip ([place]) 07/27/2017  . Avascular necrosis of bone of right hip ([place]) 06/04/2016  . Status post total replacement of right hip  06/04/2016  . Hepatitis C 03/21/2015  . History of alcohol abuse 11/08/2013  . Tobacco abuse 11/08/2013  . Essential hypertension, benign 11/07/2013  . Abnormal ECG 11/07/2013    Past Surgical History:  Procedure Laterality Date  . COLONOSCOPY N/A 01/23/2014   SLF:The LEFT COLON IS EXTREMELY redundant/TWO RECTAL POLYPS REMOVED/SMALL VOLUME RECTAL BLEEDING MOST LIKELY DUE TO Small internal hemorrhoids  . ESOPHAGOGASTRODUODENOSCOPY N/A 01/23/2014   YPP:JKDTOIZT ring at the gastroesophageal junction/Medium sized hiatal hernia/NDYSPEPSIA MOST LIKELY DUE TO GERD/MILD Non-erosive gastritis  . ESOPHAGOGASTRODUODENOSCOPY N/A 04/18/2018   Procedure: ESOPHAGOGASTRODUODENOSCOPY (EGD);  Surgeon: Daneil Dolin, MD;  [facility];  Service: Endoscopy;  Laterality: N/A;  . HEMORRHOID BANDING  01/23/2014   Procedure: HEMORRHOID BANDING;  Surgeon: Danie Binder, MD;  Location: [facility] SUITE;  Service: Endoscopy;;  . JOINT REPLACEMENT Right 2017  . MOUTH SURGERY    . TOTAL HIP ARTHROPLASTY Right 06/04/2016   Procedure: RIGHT TOTAL HIP ARTHROPLASTY ANTERIOR APPROACH;  Surgeon: Mcarthur Rossetti, MD;  Location: [facility] ORS;  Service: Orthopedics;  Laterality: Right;  . TOTAL HIP ARTHROPLASTY Left 03/02/2019   Procedure: LEFT TOTAL HIP ARTHROPLASTY ANTERIOR APPROACH;  Surgeon: Mcarthur Rossetti, MD;  Location: [facility];  Service: Orthopedics;  Laterality: Left;        Home Medications    Prior to Admission medications   Medication Sig Start Date End Date Taking? Authorizing Provider  amLODipine (NORVASC) 10 MG tablet Take  10 mg by mouth daily. 02/05/19   [provider]  aspirin 81 MG chewable tablet Chew 1 tablet (81 mg total) by mouth 2 (two) times daily. 03/04/19   Mcarthur Rossetti, MD  fenofibrate 160 MG tablet Take 160 mg by mouth daily. 11/30/18   [provider]  loratadine (CLARITIN) 10 MG tablet Take 10 mg by mouth daily.    [provider]   methocarbamol (ROBAXIN) 500 MG tablet TAKE 1 TABLET EVERY SIX HOURS AS NEEDED FOR MUSCLE SPASMS. 03/19/19   Mcarthur Rossetti, MD  methylPREDNISolone (MEDROL) 4 MG tablet Medrol dose pack. Take as instructed 04/16/19   Mcarthur Rossetti, MD  oxyCODONE (OXY IR/ROXICODONE) 5 MG immediate release tablet Take 1-2 tablets (5-10 mg total) by mouth every 6 (six) hours as needed for moderate pain (pain score 4-6). 03/19/19   Pete Pelt, PA-C  PROAIR HFA 108 [phone] Base) MCG/ACT inhaler Inhale 2 puffs into the lungs every 6 (six) hours as needed for wheezing or shortness of breath.  01/04/19   [provider]  tiZANidine (ZANAFLEX) 4 MG tablet Take 1 tablet (4 mg total) by mouth every 8 (eight) hours as needed for muscle spasms. 04/16/19   Mcarthur Rossetti, MD  Vitamin D, Ergocalciferol, (DRISDOL) 1.25 MG (50000 UT) CAPS capsule Take 50,000 Units by mouth once a week. 02/05/19   [provider]    Family History Family History  Problem Relation Age of Onset  . Cancer Mother     Social History Social History   Tobacco Use  . Smoking status: Former Smoker    Types: Cigarettes    Quit date: 05/28/2014    Years since quitting: 5.1  . Smokeless tobacco: Never Used  . Tobacco comment: quit in November 2015 after smoking x 20 yrs.  Substance Use Topics  . Alcohol use: Not Currently    Alcohol/week: 0.0 standard drinks    Comment: rare  . Drug use: No     Allergies   Patient has no known allergies.   Review of Systems Review of Systems  Constitutional: Negative for chills and fever.  HENT: Negative for sore throat.   Eyes: Negative for redness.  Respiratory: Negative for cough and shortness of breath.   Cardiovascular: Positive for palpitations. Negative for chest pain and leg swelling.  Gastrointestinal: Negative for abdominal pain, blood in stool, diarrhea and vomiting.  Endocrine: Negative for polyuria.  Genitourinary: Negative for dysuria and flank  pain.  Musculoskeletal: Negative for back pain and neck pain.  Skin: Negative for rash.  Neurological: Negative for syncope, weakness, numbness and headaches.  Hematological: Does not bruise/bleed easily.  Psychiatric/Behavioral: Negative for confusion.     Physical Exam Updated Vital Signs BP (!) 144/98 (BP Location: Left Arm)   Pulse 81   Temp 97.7 F (36.5 C) (Oral)   Resp 13   Ht 1.727 m (5\' 8" )   Wt 126.1 kg   SpO2 100%   BMI 42.27 kg/m   Physical Exam Vitals signs and nursing note reviewed.  Constitutional:      Appearance: Normal appearance. He is well-developed.  HENT:     Head: Atraumatic.     Nose: Nose normal.     Mouth/Throat:     Mouth: Mucous membranes are moist.     Pharynx: Oropharynx is clear.  Eyes:     General: No scleral icterus.    Conjunctiva/sclera: Conjunctivae normal.     Pupils: Pupils are equal, round, and reactive to  light.  Neck:     Musculoskeletal: Normal range of motion and neck supple. No neck rigidity.     Vascular: No carotid bruit.     Trachea: No tracheal deviation.     Comments: Thyroid not grossly enlarged or tender.  Cardiovascular:     Rate and Rhythm: Normal rate and regular rhythm.     Pulses: Normal pulses.     Heart sounds: Normal heart sounds. No murmur. No friction rub. No gallop.   Pulmonary:     Effort: Pulmonary effort is normal. No accessory muscle usage or respiratory distress.     Breath sounds: Normal breath sounds.  Abdominal:     General: Bowel sounds are normal. There is no distension.     Palpations: Abdomen is soft. There is no mass.     Tenderness: There is no abdominal tenderness. There is no guarding.     Comments: Obese.   Genitourinary:    Comments: No cva tenderness. Musculoskeletal:        General: No swelling or tenderness.     Right lower leg: No edema.     Left lower leg: No edema.  Skin:    General: Skin is warm and dry.     Findings: No rash.  Neurological:     Mental Status: He is  alert.     Comments: Alert, speech clear/fluent. Motor/sens grossly intact bil. Walks w cane.   Psychiatric:        Mood and Affect: Mood normal.      ED Treatments / Results  Labs (all labs ordered are listed, but only abnormal results are displayed) Results for orders placed or performed during the hospital encounter of 08/07/19  CMET  Result Value Ref Range   Sodium 136 135 - 145 mmol/L   Potassium 3.9 3.5 - 5.1 mmol/L   Chloride 102 98 - 111 mmol/L   CO2 23 22 - 32 mmol/L   Glucose, Bld 104 (H) 70 - 99 mg/dL   BUN 19 6 - 20 mg/dL   Creatinine, Ser 1.49 (H) 0.61 - 1.24 mg/dL   Calcium 9.6 8.9 - 10.3 mg/dL   Total Protein 8.5 (H) 6.5 - 8.1 g/dL   Albumin 3.7 3.5 - 5.0 g/dL   AST 45 (H) 15 - 41 U/L   ALT 63 (H) 0 - 44 U/L   Alkaline Phosphatase 99 38 - 126 U/L   Total Bilirubin 0.9 0.3 - 1.2 mg/dL   GFR calc non Af Amer 51 (L) >60 mL/min   GFR calc Af Amer 59 (L) >60 mL/min   Anion gap 11 5 - 15  CBC  Result Value Ref Range   WBC 9.2 4.0 - 10.5 K/uL   RBC 4.75 4.22 - 5.81 MIL/uL   Hemoglobin 13.2 13.0 - 17.0 g/dL   HCT 42.2 39.0 - 52.0 %   MCV 88.8 80.0 - 100.0 fL   MCH 27.8 26.0 - 34.0 pg   MCHC 31.3 30.0 - 36.0 g/dL   RDW 15.8 (H) 11.5 - 15.5 %   Platelets 277 150 - 400 K/uL   nRBC 0.0 0.0 - 0.2 %    EKG EKG Interpretation  Date/Time:  Tuesday August 07 2019 04:47:58 EST Ventricular Rate:  75 PR Interval:    QRS Duration: 80 QT Interval:  366 QTC Calculation: 409 R Axis:   32 Text Interpretation: Sinus rhythm No significant change since last tracing Confirmed by Lajean Saver [phone]) on 08/07/2019 4:56:03 AM   Radiology No  results found.  Procedures Procedures (including critical care time)  Medications Ordered in ED Medications - No data to display   Initial Impression / Assessment and Plan / ED Course  I have reviewed the triage vital signs and the nursing notes.  Pertinent labs & imaging results that were available during my care of the  patient were reviewed by me and considered in my medical decision making (see chart for details).  Iv ns. Continuous pulse ox and monitor. Ecg. Labs sent.   Reviewed nursing notes and prior charts for additional history.   Po fluids, food.   Remains in nsr on monitor, no dysrhythmia.   Labs reviewed/interpreted by me - chem normal, c/w baseline with exception mild elev ast/alt. No abd pain or tenderness. Some recent etoh increased use above baseline due to holiday. Will hold chol med, and have f/u pcp.   Recheck pt, remains in nsr. Vitals stable. No palpitations. No cp or discomfort. No sob.   Pt requests dose of his bp med in ED, states he normally takes in AM and has not yet taken today - amlodipine 10 mg po.   Patient currently appears stable for d/c.   rec pcp f/u. At d/c, pt also asks for referral to eye specialist indicating he tried to call on own and was told February, but doesn't want to wait until then for check up.   Return precautions provided.    Final Clinical Impressions(s) / ED Diagnoses   Final diagnoses:  None    ED Discharge Orders    None           Lajean Saver, MD 08/07/19 [phone]

## 2019-08-07 NOTE — ED Triage Notes (Signed)
Pt states he was lying down tonight when he started to feel like his heart was racing

## 2019-08-10 DIAGNOSIS — Z6841 Body Mass Index (BMI) 40.0 and over, adult: Secondary | ICD-10-CM | POA: Diagnosis not present

## 2019-08-10 DIAGNOSIS — H43811 Vitreous degeneration, right eye: Secondary | ICD-10-CM | POA: Diagnosis not present

## 2019-08-10 DIAGNOSIS — H2513 Age-related nuclear cataract, bilateral: Secondary | ICD-10-CM | POA: Diagnosis not present

## 2019-08-10 DIAGNOSIS — G894 Chronic pain syndrome: Secondary | ICD-10-CM | POA: Diagnosis not present

## 2019-08-10 DIAGNOSIS — R945 Abnormal results of liver function studies: Secondary | ICD-10-CM | POA: Diagnosis not present

## 2019-08-27 DIAGNOSIS — R110 Nausea: Secondary | ICD-10-CM | POA: Diagnosis not present

## 2019-08-27 DIAGNOSIS — Z20828 Contact with and (suspected) exposure to other viral communicable diseases: Secondary | ICD-10-CM | POA: Diagnosis not present

## 2019-08-27 DIAGNOSIS — Z681 Body mass index (BMI) 19 or less, adult: Secondary | ICD-10-CM | POA: Diagnosis not present

## 2019-08-29 ENCOUNTER — Emergency Department (HOSPITAL_COMMUNITY): Payer: BC Managed Care – PPO

## 2019-08-29 ENCOUNTER — Other Ambulatory Visit: Payer: Self-pay

## 2019-08-29 ENCOUNTER — Inpatient Hospital Stay (HOSPITAL_COMMUNITY)
Admission: EM | Admit: 2019-08-29 | Discharge: 2019-09-02 | DRG: 177 | Disposition: A | Discharge status: Home / Self Care | Payer: BC Managed Care – PPO | Attending: Internal Medicine | Admitting: Internal Medicine

## 2019-08-29 ENCOUNTER — Encounter (HOSPITAL_COMMUNITY): Payer: Self-pay | Admitting: Emergency Medicine

## 2019-08-29 DIAGNOSIS — I10 Essential (primary) hypertension: Secondary | ICD-10-CM

## 2019-08-29 DIAGNOSIS — N179 Acute kidney failure, unspecified: Secondary | ICD-10-CM

## 2019-08-29 DIAGNOSIS — Z87891 Personal history of nicotine dependence: Secondary | ICD-10-CM

## 2019-08-29 DIAGNOSIS — G894 Chronic pain syndrome: Secondary | ICD-10-CM

## 2019-08-29 DIAGNOSIS — B1920 Unspecified viral hepatitis C without hepatic coma: Secondary | ICD-10-CM | POA: Diagnosis present

## 2019-08-29 DIAGNOSIS — M25559 Pain in unspecified hip: Secondary | ICD-10-CM | POA: Diagnosis present

## 2019-08-29 DIAGNOSIS — I129 Hypertensive chronic kidney disease with stage 1 through stage 4 chronic kidney disease, or unspecified chronic kidney disease: Secondary | ICD-10-CM | POA: Diagnosis present

## 2019-08-29 DIAGNOSIS — J1282 Pneumonia due to coronavirus disease 2019: Secondary | ICD-10-CM

## 2019-08-29 DIAGNOSIS — N289 Disorder of kidney and ureter, unspecified: Secondary | ICD-10-CM | POA: Diagnosis not present

## 2019-08-29 DIAGNOSIS — F419 Anxiety disorder, unspecified: Secondary | ICD-10-CM | POA: Diagnosis not present

## 2019-08-29 DIAGNOSIS — Z7982 Long term (current) use of aspirin: Secondary | ICD-10-CM | POA: Diagnosis not present

## 2019-08-29 DIAGNOSIS — J440 Chronic obstructive pulmonary disease with acute lower respiratory infection: Secondary | ICD-10-CM | POA: Diagnosis present

## 2019-08-29 DIAGNOSIS — J1281 Pneumonia due to SARS-associated coronavirus: Secondary | ICD-10-CM | POA: Diagnosis not present

## 2019-08-29 DIAGNOSIS — J1289 Other viral pneumonia: Secondary | ICD-10-CM | POA: Diagnosis not present

## 2019-08-29 DIAGNOSIS — Z86010 Personal history of colonic polyps: Secondary | ICD-10-CM | POA: Diagnosis not present

## 2019-08-29 DIAGNOSIS — J9621 Acute and chronic respiratory failure with hypoxia: Secondary | ICD-10-CM

## 2019-08-29 DIAGNOSIS — E860 Dehydration: Secondary | ICD-10-CM | POA: Diagnosis present

## 2019-08-29 DIAGNOSIS — R05 Cough: Secondary | ICD-10-CM | POA: Diagnosis not present

## 2019-08-29 DIAGNOSIS — U071 COVID-19: Secondary | ICD-10-CM | POA: Diagnosis not present

## 2019-08-29 DIAGNOSIS — Z79899 Other long term (current) drug therapy: Secondary | ICD-10-CM | POA: Diagnosis not present

## 2019-08-29 DIAGNOSIS — N1832 Chronic kidney disease, stage 3b: Secondary | ICD-10-CM | POA: Diagnosis present

## 2019-08-29 DIAGNOSIS — E782 Mixed hyperlipidemia: Secondary | ICD-10-CM | POA: Diagnosis not present

## 2019-08-29 DIAGNOSIS — Z8616 Personal history of COVID-19: Secondary | ICD-10-CM

## 2019-08-29 DIAGNOSIS — F1010 Alcohol abuse, uncomplicated: Secondary | ICD-10-CM | POA: Diagnosis present

## 2019-08-29 DIAGNOSIS — G4730 Sleep apnea, unspecified: Secondary | ICD-10-CM | POA: Diagnosis present

## 2019-08-29 DIAGNOSIS — J9601 Acute respiratory failure with hypoxia: Secondary | ICD-10-CM | POA: Diagnosis not present

## 2019-08-29 DIAGNOSIS — Z96643 Presence of artificial hip joint, bilateral: Secondary | ICD-10-CM | POA: Diagnosis present

## 2019-08-29 DIAGNOSIS — R110 Nausea: Secondary | ICD-10-CM | POA: Diagnosis present

## 2019-08-29 LAB — D-DIMER, QUANTITATIVE: D-Dimer, Quant: 0.84 ug/mL-FEU — ABNORMAL HIGH (ref 0.00–0.50)

## 2019-08-29 LAB — FERRITIN: Ferritin: 479 ng/mL — ABNORMAL HIGH (ref 24–336)

## 2019-08-29 LAB — CBC WITH DIFFERENTIAL/PLATELET
Abs Immature Granulocytes: 0.08 10*3/uL — ABNORMAL HIGH (ref 0.00–0.07)
Basophils Absolute: 0 10*3/uL (ref 0.0–0.1)
Basophils Relative: 0 %
Eosinophils Absolute: 0.1 10*3/uL (ref 0.0–0.5)
Eosinophils Relative: 1 %
HCT: 40.4 % (ref 39.0–52.0)
Hemoglobin: 12.5 g/dL — ABNORMAL LOW (ref 13.0–17.0)
Immature Granulocytes: 1 %
Lymphocytes Relative: 11 %
Lymphs Abs: 0.8 10*3/uL (ref 0.7–4.0)
MCH: 27.7 pg (ref 26.0–34.0)
MCHC: 30.9 g/dL (ref 30.0–36.0)
MCV: 89.6 fL (ref 80.0–100.0)
Monocytes Absolute: 0.5 10*3/uL (ref 0.1–1.0)
Monocytes Relative: 7 %
Neutro Abs: 6 10*3/uL (ref 1.7–7.7)
Neutrophils Relative %: 80 %
Platelets: 261 10*3/uL (ref 150–400)
RBC: 4.51 MIL/uL (ref 4.22–5.81)
RDW: 15.5 % (ref 11.5–15.5)
WBC: 7.5 10*3/uL (ref 4.0–10.5)
nRBC: 0 % (ref 0.0–0.2)

## 2019-08-29 LAB — BLOOD GAS, ARTERIAL
Acid-base deficit: 0.1 mmol/L (ref 0.0–2.0)
Bicarbonate: 24.2 mmol/L (ref 20.0–28.0)
FIO2: 28
O2 Saturation: 90.7 %
Patient temperature: 36.7
pCO2 arterial: 40.5 mmHg (ref 32.0–48.0)
pH, Arterial: 7.393 (ref 7.350–7.450)
pO2, Arterial: 63 mmHg — ABNORMAL LOW (ref 83.0–108.0)

## 2019-08-29 LAB — COMPREHENSIVE METABOLIC PANEL
ALT: 71 U/L — ABNORMAL HIGH (ref 0–44)
AST: 55 U/L — ABNORMAL HIGH (ref 15–41)
Albumin: 3.5 g/dL (ref 3.5–5.0)
Alkaline Phosphatase: 92 U/L (ref 38–126)
Anion gap: 14 (ref 5–15)
BUN: 34 mg/dL — ABNORMAL HIGH (ref 6–20)
CO2: 21 mmol/L — ABNORMAL LOW (ref 22–32)
Calcium: 9 mg/dL (ref 8.9–10.3)
Chloride: 102 mmol/L (ref 98–111)
Creatinine, Ser: 3.23 mg/dL — ABNORMAL HIGH (ref 0.61–1.24)
GFR calc Af Amer: 23 mL/min — ABNORMAL LOW (ref >60)
GFR calc non Af Amer: 20 mL/min — ABNORMAL LOW (ref >60)
Glucose, Bld: 99 mg/dL (ref 70–99)
Potassium: 4 mmol/L (ref 3.5–5.1)
Sodium: 137 mmol/L (ref 135–145)
Total Bilirubin: 0.9 mg/dL (ref 0.3–1.2)
Total Protein: 8.6 g/dL — ABNORMAL HIGH (ref 6.5–8.1)

## 2019-08-29 LAB — TRIGLYCERIDES: Triglycerides: 199 mg/dL — ABNORMAL HIGH (ref <150)

## 2019-08-29 LAB — POC SARS CORONAVIRUS 2 AG -  ED: SARS Coronavirus 2 Ag: POSITIVE — AB

## 2019-08-29 LAB — PROCALCITONIN: Procalcitonin: 0.11 ng/mL

## 2019-08-29 LAB — HIV ANTIBODY (ROUTINE TESTING W REFLEX): HIV Screen 4th Generation wRfx: NONREACTIVE

## 2019-08-29 LAB — ABO/RH: ABO/RH(D): O POS

## 2019-08-29 LAB — LACTATE DEHYDROGENASE: LDH: 284 U/L — ABNORMAL HIGH (ref 98–192)

## 2019-08-29 LAB — FIBRINOGEN: Fibrinogen: >800 mg/dL — ABNORMAL HIGH (ref 210–475)

## 2019-08-29 LAB — LACTIC ACID, PLASMA
Lactic Acid, Venous: 1.9 mmol/L (ref 0.5–1.9)
Lactic Acid, Venous: 0.9 mmol/L (ref 0.5–1.9)

## 2019-08-29 LAB — C-REACTIVE PROTEIN: CRP: 11.8 mg/dL — ABNORMAL HIGH (ref <1.0)

## 2019-08-29 MED ORDER — SODIUM CHLORIDE 0.9 % IV SOLN
100.0000 mg | Freq: Every day | INTRAVENOUS | Status: AC
  Administered 2019-08-29 – 2019-09-02 (×5): 100 mg via INTRAVENOUS
  Filled 2019-08-29 (×3): qty 20
  Filled 2019-08-29: qty 100

## 2019-08-29 MED ORDER — ONDANSETRON HCL 4 MG PO TABS: 4.0000 mg | ORAL_TABLET | Freq: Four times a day (QID) | ORAL | Status: DC | PRN

## 2019-08-29 MED ORDER — ASPIRIN 81 MG PO CHEW
81.0000 mg | CHEWABLE_TABLET | Freq: Two times a day (BID) | ORAL | Status: DC
  Administered 2019-08-29 – 2019-09-02 (×8): 81 mg via ORAL
  Filled 2019-08-29 (×9): qty 1

## 2019-08-29 MED ORDER — ONDANSETRON HCL 4 MG/2ML IJ SOLN: 4.0000 mg | Freq: Four times a day (QID) | INTRAMUSCULAR | Status: DC | PRN

## 2019-08-29 MED ORDER — ALBUTEROL SULFATE HFA 108 (90 BASE) MCG/ACT IN AERS
2.0000 | INHALATION_SPRAY | Freq: Four times a day (QID) | RESPIRATORY_TRACT | Status: DC | PRN
  Administered 2019-08-31: 2 via RESPIRATORY_TRACT
  Filled 2019-08-29: qty 6.7

## 2019-08-29 MED ORDER — LACTATED RINGERS IV SOLN
INTRAVENOUS | Status: DC
  Administered 2019-08-29 – 2019-08-30 (×2): 100 mL/h via INTRAVENOUS

## 2019-08-29 MED ORDER — METHOCARBAMOL 500 MG PO TABS
500.0000 mg | ORAL_TABLET | Freq: Four times a day (QID) | ORAL | Status: DC | PRN
  Administered 2019-08-31 – 2019-09-01 (×3): 500 mg via ORAL
  Filled 2019-08-29 (×3): qty 1

## 2019-08-29 MED ORDER — ACETAMINOPHEN 325 MG PO TABS
650.0000 mg | ORAL_TABLET | Freq: Four times a day (QID) | ORAL | Status: DC | PRN
  Administered 2019-08-29 – 2019-09-01 (×2): 650 mg via ORAL
  Filled 2019-08-29 (×2): qty 2

## 2019-08-29 MED ORDER — OXYCODONE HCL 5 MG PO TABS
5.0000 mg | ORAL_TABLET | Freq: Four times a day (QID) | ORAL | Status: DC | PRN
  Administered 2019-08-29 – 2019-09-02 (×6): 10 mg via ORAL
  Filled 2019-08-29 (×6): qty 2

## 2019-08-29 MED ORDER — TOCILIZUMAB 400 MG/20ML IV SOLN
800.0000 mg | Freq: Once | INTRAVENOUS | Status: DC
  Filled 2019-08-29: qty 40

## 2019-08-29 MED ORDER — SODIUM CHLORIDE 0.9 % IV SOLN
200.0000 mg | Freq: Once | INTRAVENOUS | Status: AC
  Administered 2019-08-29: 16:00:00 200 mg via INTRAVENOUS
  Filled 2019-08-29: qty 40

## 2019-08-29 MED ORDER — FENOFIBRATE 160 MG PO TABS
160.0000 mg | ORAL_TABLET | Freq: Every day | ORAL | Status: DC
  Administered 2019-08-31 – 2019-09-01 (×2): 160 mg via ORAL
  Filled 2019-08-29 (×7): qty 1

## 2019-08-29 MED ORDER — HEPARIN SODIUM (PORCINE) 5000 UNIT/ML IJ SOLN
5000.0000 [IU] | Freq: Three times a day (TID) | INTRAMUSCULAR | Status: DC
  Administered 2019-08-29 – 2019-09-02 (×12): 5000 [IU] via SUBCUTANEOUS
  Filled 2019-08-29 (×13): qty 1

## 2019-08-29 MED ORDER — LORATADINE 10 MG PO TABS
10.0000 mg | ORAL_TABLET | Freq: Every day | ORAL | Status: DC
  Administered 2019-08-29 – 2019-09-02 (×5): 10 mg via ORAL
  Filled 2019-08-29 (×5): qty 1

## 2019-08-29 MED ORDER — AMLODIPINE BESYLATE 5 MG PO TABS
10.0000 mg | ORAL_TABLET | Freq: Every day | ORAL | Status: DC
  Administered 2019-08-29 – 2019-09-02 (×5): 10 mg via ORAL
  Filled 2019-08-29 (×5): qty 2

## 2019-08-29 MED ORDER — DEXAMETHASONE SODIUM PHOSPHATE 10 MG/ML IJ SOLN
6.0000 mg | INTRAMUSCULAR | Status: DC
  Administered 2019-08-29 – 2019-09-02 (×5): 6 mg via INTRAVENOUS
  Filled 2019-08-29 (×4): qty 1

## 2019-08-29 NOTE — H&P (Signed)
History and Physical    Mathew Lara ZJI:[id number] DOB: 22-Oct-1960 DOA: 08/29/2019  PCP: [name], MD   Patient coming from: Home  Chief Complaint: Dyspnea/nausea  HPI: Mathew Lara is a 58 y.o. male with medical history significant for COPD, hepatitis C, alcohol abuse, hypertension, and stage III CKD who presented to the ED with complaints of some nausea as well as headache.  He denies any fevers or chills and states that he is having some shortness of breath as well.  He denies any abdominal pain, coughing, or diarrhea.  He had seen his primary care doctor who had given him some nausea medication but this has not helped him.  He has come into contact with sick people at work with coronavirus as well.   ED Course: Vital signs are currently stable patient is afebrile.  Hemoglobin is 12.5, BUN is 34 and creatinine is 3.23 with baseline of 1.4-1.7.  CRP is elevated at 11.8 and procalcitonin is 0.11.  1 view chest x-ray with atypical infiltrative findings suggestive of COVID-19.  He is COVID-19 testing has come back positive.  EKG with sinus rhythm at 99 bpm noted.  His D-dimer and fibrinogen are also elevated.  Review of Systems: All others reviewed as noted above and otherwise negative.  Past Medical History:  Diagnosis Date  . Alcohol abuse   . Anxiety   . Bronchitis   . Chronic hip pain   . Chronic kidney disease (CKD) stage G3b/A3, moderately decreased glomerular filtration rate (GFR) between 30-44 mL/min/1.73 square meter and albuminuria creatinine ratio greater than 300 mg/g 01/03/2018  . COPD (chronic obstructive pulmonary disease) ([place])   . Hepatitis C   . Hypertension   . Mixed hyperlipidemia   . Renal disorder    states renal dysfunction, "strained kidney"  . Sleep apnea     Past Surgical History:  Procedure Laterality Date  . COLONOSCOPY N/A 01/23/2014   SLF:The LEFT COLON IS EXTREMELY redundant/TWO RECTAL POLYPS REMOVED/SMALL VOLUME RECTAL BLEEDING MOST LIKELY  DUE TO Small internal hemorrhoids  . ESOPHAGOGASTRODUODENOSCOPY N/A 01/23/2014   FBP:ZWCHENID ring at the gastroesophageal junction/Medium sized hiatal hernia/NDYSPEPSIA MOST LIKELY DUE TO GERD/MILD Non-erosive gastritis  . ESOPHAGOGASTRODUODENOSCOPY N/A 04/18/2018   Procedure: ESOPHAGOGASTRODUODENOSCOPY (EGD);  Surgeon: Daneil Dolin, MD;  [facility];  Service: Endoscopy;  Laterality: N/A;  . HEMORRHOID BANDING  01/23/2014   Procedure: HEMORRHOID BANDING;  Surgeon: Danie Binder, MD;  [facility];  Service: Endoscopy;;  . JOINT REPLACEMENT Right 2017  . MOUTH SURGERY    . TOTAL HIP ARTHROPLASTY Right 06/04/2016   Procedure: RIGHT TOTAL HIP ARTHROPLASTY ANTERIOR APPROACH;  Surgeon: Mcarthur Rossetti, MD;  Location: [facility];  Service: Orthopedics;  Laterality: Right;  . TOTAL HIP ARTHROPLASTY Left 03/02/2019   Procedure: LEFT TOTAL HIP ARTHROPLASTY ANTERIOR APPROACH;  Surgeon: Mcarthur Rossetti, MD;  Location: [facility];  Service: Orthopedics;  Laterality: Left;     reports that he quit smoking about 5 years ago. His smoking use included cigarettes. He has never used smokeless tobacco. He reports previous alcohol use. He reports that he does not use drugs.  No Known Allergies  Family History  Problem Relation Age of Onset  . Cancer Mother     Prior to Admission medications   Medication Sig Start Date End Date Taking? Authorizing Provider  amLODipine (NORVASC) 10 MG tablet Take 10 mg by mouth daily. 02/05/19   [provider]  aspirin 81 MG chewable tablet Chew 1 tablet (  81 mg total) by mouth 2 (two) times daily. 03/04/19   Mcarthur Rossetti, MD  fenofibrate 160 MG tablet Take 160 mg by mouth daily. 11/30/18   [provider]  loratadine (CLARITIN) 10 MG tablet Take 10 mg by mouth daily.    [provider]  methocarbamol (ROBAXIN) 500 MG tablet TAKE 1 TABLET EVERY SIX HOURS AS NEEDED FOR MUSCLE SPASMS. 03/19/19   Mcarthur Rossetti, MD  methylPREDNISolone (MEDROL) 4 MG tablet Medrol dose pack. Take as instructed 04/16/19   Mcarthur Rossetti, MD  oxyCODONE (OXY IR/ROXICODONE) 5 MG immediate release tablet Take 1-2 tablets (5-10 mg total) by mouth every 6 (six) hours as needed for moderate pain (pain score 4-6). 03/19/19   Pete Pelt, PA-C  PROAIR HFA 108 [phone] Base) MCG/ACT inhaler Inhale 2 puffs into the lungs every 6 (six) hours as needed for wheezing or shortness of breath.  01/04/19   [provider]  tiZANidine (ZANAFLEX) 4 MG tablet Take 1 tablet (4 mg total) by mouth every 8 (eight) hours as needed for muscle spasms. 04/16/19   Mcarthur Rossetti, MD  Vitamin D, Ergocalciferol, (DRISDOL) 1.25 MG (50000 UT) CAPS capsule Take 50,000 Units by mouth once a week. 02/05/19   [provider]    Physical Exam: Vitals:   08/29/19 1045 08/29/19 1046 08/29/19 1245  BP: 116/86    Pulse: (!) 109  94  Resp: (!) 24    Temp: 99.2 F (37.3 C)    TempSrc: Oral    SpO2: (!) 81%  95%  Weight:  126.1 kg   Height:  5\' 8"  (1.727 m)     Constitutional: NAD, calm, comfortable Vitals:   08/29/19 1045 08/29/19 1046 08/29/19 1245  BP: 116/86    Pulse: (!) 109  94  Resp: (!) 24    Temp: 99.2 F (37.3 C)    TempSrc: Oral    SpO2: (!) 81%  95%  Weight:  126.1 kg   Height:  5\' 8"  (1.727 m)    Eyes: lids and conjunctivae normal ENMT: Mucous membranes are moist.  Neck: normal, supple Respiratory: clear to auscultation bilaterally. Normal respiratory effort. No accessory muscle use.  Currently on 2 L nasal cannula oxygen. Cardiovascular: Regular rate and rhythm, no murmurs. No extremity edema. Abdomen: no tenderness, no distention. Bowel sounds positive.  Musculoskeletal:  No joint deformity upper and lower extremities.   Skin: no rashes, lesions, ulcers.  Psychiatric: Normal judgment and insight. Alert and oriented x 3. Normal mood.   Labs on Admission: I have personally reviewed  following labs and imaging studies  CBC: Recent Labs  Lab 08/29/19 1155  WBC 7.5  NEUTROABS 6.0  HGB 12.5*  HCT 40.4  MCV 89.6  PLT 371   Basic Metabolic Panel: Recent Labs  Lab 08/29/19 1155  NA 137  K 4.0  CL 102  CO2 21*  GLUCOSE 99  BUN 34*  CREATININE 3.23*  CALCIUM 9.0   GFR: Estimated Creatinine Clearance: 31.9 mL/min (A) (by C-G formula based on SCr of 3.23 mg/dL (H)). Liver Function Tests: Recent Labs  Lab 08/29/19 1155  AST 55*  ALT 71*  ALKPHOS 92  BILITOT 0.9  PROT 8.6*  ALBUMIN 3.5   No results for input(s): LIPASE, AMYLASE in the last 168 hours. No results for input(s): AMMONIA in the last 168 hours. Coagulation Profile: No results for input(s): INR, PROTIME in the last 168 hours. Cardiac Enzymes: No results for input(s): CKTOTAL, CKMB, CKMBINDEX,  TROPONINI in the last 168 hours. BNP (last 3 results) No results for input(s): PROBNP in the last 8760 hours. HbA1C: No results for input(s): HGBA1C in the last 72 hours. CBG: No results for input(s): GLUCAP in the last 168 hours. Lipid Profile: Recent Labs    08/29/19 1155  TRIG 199*   Thyroid Function Tests: No results for input(s): TSH, T4TOTAL, FREET4, T3FREE, THYROIDAB in the last 72 hours. Anemia Panel: Recent Labs    08/29/19 1155  FERRITIN 479*   Urine analysis:    Component Value Date/Time   COLORURINE YELLOW 03/26/2018 0721   APPEARANCEUR CLOUDY (A) 03/26/2018 0721   LABSPEC 1.021 03/26/2018 0721   PHURINE 5.0 03/26/2018 0721   GLUCOSEU NEGATIVE 03/26/2018 0721   HGBUR NEGATIVE 03/26/2018 0721   BILIRUBINUR NEGATIVE 03/26/2018 0721   KETONESUR NEGATIVE 03/26/2018 0721   PROTEINUR 100 (A) 03/26/2018 0721   UROBILINOGEN 0.2 07/06/2013 1024   NITRITE NEGATIVE 03/26/2018 0721   LEUKOCYTESUR NEGATIVE 03/26/2018 0721    Radiological Exams on Admission: DG Chest Port 1 View  Result Date: 08/29/2019 CLINICAL DATA:  58 year old male with cough and hypoxia. COVID-19 status  pending. EXAM: PORTABLE CHEST 1 VIEW COMPARISON:  Chest radiographs 04/18/2018 and earlier. FINDINGS: Mild chronic peripheral and slightly asymmetric bilateral lung opacity seen in 2019. Stable lung volumes and mediastinal contours. Negative trachea. Evidence of upper lobe emphysema, a especially on the right, where now there is superimposed more confluent pulmonary ground-glass opacity. Increased patchy and indistinct left upper and mid lung opacity also superimposed on the chronic density. No pneumothorax or pleural effusion. Paucity of bowel gas. No acute osseous abnormality identified. IMPRESSION: Chronic lung disease with evidence of Emphysema (ICD10-J43.9). Superimposed bilateral new patchy and indistinct pulmonary opacity compatible with acute viral/atypical respiratory infection and suspicious for COVID-19 pneumonia. No pleural effusion. a Electronically Signed   By: Genevie Ann M.D.   On: 08/29/2019 11:31    EKG: Independently reviewed.  SR 99 bpm.  Assessment/Plan Active Problems:   Acute hypoxemic respiratory failure due to COVID-19 [facility])    Acute hypoxemic respiratory failure secondary to COVID-19 pneumonia -Continue to monitor repeat inflammatory markers -Initiate dexamethasone as well as remdesivir -Considering Actemra for CRP elevation after further discussion with physicians at [facility] -Albuterol inhaler as needed -Antitussives as needed -Antiemetics  AKI on CKD stage III -Continue on IV fluid and monitor repeat labs -Likely secondary to some dehydration related to nausea  Hypertension -Continue home medications  Dyslipidemia -Continue home medications  COPD -Albuterol inhalers as needed -Does not appear to be having any acute bronchospasm currently  DVT prophylaxis: Heparin Code Status: Full Family Communication: None at bedside Disposition Plan: Admit for treatment of COVID-19 pneumonia as well as AKI Consults called: None Admission status: Inpatient,  MedSurg   Aneesh Faller Darleen Crocker DO [facility] Pager [phone]  If 7PM-7AM, please contact night-coverage [email or web address] Password [facility]  08/29/2019, 3:18 PM

## 2019-08-29 NOTE — ED Triage Notes (Signed)
Patient reports nausea and vomiting over the weekend. Was seen by PCP on Monday and given nausea medication. Patient reports his COVID test was negative. Patient reports today he woke up with a sore throat, nausea, and weakness. Patient's O2 in Triage 81%.

## 2019-08-29 NOTE — ED Provider Notes (Signed)
[facility] EMERGENCY DEPARTMENT Provider Note   CSN: [id number] Arrival date & time: 08/29/19  1036     History Chief Complaint  Patient presents with  . Shortness of Breath    Mathew Lara is a 58 y.o. male.  HPI   This patient is a 58 year old male, he has a known history of alcohol abuse, chronic kidney disease, hepatitis C but also has COPD from years of smoking.  He has not smoked in 5 or 6 years.  He presents to the hospital complaining of nausea.  He was initially seen at his doctor's office within the last week and tested for coronavirus, it came back negative.  His primary symptoms are nausea.  He does not have fevers though he does have some occasional chills.  He is feeling some shortness of breath and was noted to be hypoxic in triage at 81% on room air.  He denies coughing, denies swelling of the legs, denies diarrhea.  His family doctor gave him nausea medication but it has not helped, his nausea is persistent and he is feeling like he is not able to tolerate any food or fluids.  He has had multiple sick people at work with coronavirus, he has nobody at home that has been sick.  The patient denies any prior history of cardiac disease, arrhythmias or anticoagulation.   Past Medical History:  Diagnosis Date  . Alcohol abuse   . Anxiety   . Bronchitis   . Chronic hip pain   . Chronic kidney disease (CKD) stage G3b/A3, moderately decreased glomerular filtration rate (GFR) between 30-44 mL/min/1.73 square meter and albuminuria creatinine ratio greater than 300 mg/g 01/03/2018  . COPD (chronic obstructive pulmonary disease) ([place])   . Hepatitis C   . Hypertension   . Mixed hyperlipidemia   . Renal disorder    states renal dysfunction, "strained kidney"  . Sleep apnea     Patient Active Problem List   Diagnosis Date Noted  . 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 Acute kidney injury ([place]) 03/26/2018  . Rash 03/26/2018  . Chest pain 03/08/2018  .  CKD (chronic kidney disease), stage III 03/08/2018  . COPD (chronic obstructive pulmonary disease) ([place]) 03/08/2018  . Avascular necrosis of bone of left hip ([place]) 07/27/2017  . Avascular necrosis of bone of right hip ([place]) 06/04/2016  . Status post total replacement of right hip 06/04/2016  . Hepatitis C 03/21/2015  . History of alcohol abuse 11/08/2013  . Tobacco abuse 11/08/2013  . Essential hypertension, benign 11/07/2013  . Abnormal ECG 11/07/2013    Past Surgical History:  Procedure Laterality Date  . COLONOSCOPY N/A 01/23/2014   SLF:The LEFT COLON IS EXTREMELY redundant/TWO RECTAL POLYPS REMOVED/SMALL VOLUME RECTAL BLEEDING MOST LIKELY DUE TO Small internal hemorrhoids  . ESOPHAGOGASTRODUODENOSCOPY N/A 01/23/2014   WUJ:WJXBJYNW ring at the gastroesophageal junction/Medium sized hiatal hernia/NDYSPEPSIA MOST LIKELY DUE TO GERD/MILD Non-erosive gastritis  . ESOPHAGOGASTRODUODENOSCOPY N/A 04/18/2018   Procedure: ESOPHAGOGASTRODUODENOSCOPY (EGD);  Surgeon: Daneil Dolin, MD;  Location: [facility] ENDO SUITE;  Service: Endoscopy;  Laterality: N/A;  . HEMORRHOID BANDING  01/23/2014   Procedure: HEMORRHOID BANDING;  Surgeon: Danie Binder, MD;  Location: [facility] ENDO SUITE;  Service: Endoscopy;;  . JOINT REPLACEMENT Right 2017  . MOUTH SURGERY    . TOTAL HIP ARTHROPLASTY Right 06/04/2016   Procedure: RIGHT TOTAL HIP ARTHROPLASTY ANTERIOR APPROACH;  Surgeon: Mcarthur Rossetti, MD;  Location: [facility] ORS;  Service: Orthopedics;  Laterality: Right;  . TOTAL  HIP ARTHROPLASTY Left 03/02/2019   Procedure: LEFT TOTAL HIP ARTHROPLASTY ANTERIOR APPROACH;  Surgeon: Mcarthur Rossetti, MD;  Location: [facility] ORS;  Service: Orthopedics;  Laterality: Left;       Family History  Problem Relation Age of Onset  . Cancer Mother     Social History   Tobacco Use  . Smoking status: Former Smoker    Types: Cigarettes    Quit date: 05/28/2014    Years since quitting: 5.2  . Smokeless tobacco: Never Used  .  Tobacco comment: quit in November 2015 after smoking x 20 yrs.  Substance Use Topics  . Alcohol use: Not Currently    Alcohol/week: 0.0 standard drinks    Comment: rare  . Drug use: No    Home Medications Prior to Admission medications   Medication Sig Start Date End Date Taking? Authorizing Provider  amLODipine (NORVASC) 10 MG tablet Take 10 mg by mouth daily. 02/05/19   [provider]  aspirin 81 MG chewable tablet Chew 1 tablet (81 mg total) by mouth 2 (two) times daily. 03/04/19   Mcarthur Rossetti, MD  fenofibrate 160 MG tablet Take 160 mg by mouth daily. 11/30/18   [provider]  loratadine (CLARITIN) 10 MG tablet Take 10 mg by mouth daily.    [provider]  methocarbamol (ROBAXIN) 500 MG tablet TAKE 1 TABLET EVERY SIX HOURS AS NEEDED FOR MUSCLE SPASMS. 03/19/19   Mcarthur Rossetti, MD  methylPREDNISolone (MEDROL) 4 MG tablet Medrol dose pack. Take as instructed 04/16/19   Mcarthur Rossetti, MD  oxyCODONE (OXY IR/ROXICODONE) 5 MG immediate release tablet Take 1-2 tablets (5-10 mg total) by mouth every 6 (six) hours as needed for moderate pain (pain score 4-6). 03/19/19   Pete Pelt, PA-C  PROAIR HFA 108 [phone] Base) MCG/ACT inhaler Inhale 2 puffs into the lungs every 6 (six) hours as needed for wheezing or shortness of breath.  01/04/19   [provider]  tiZANidine (ZANAFLEX) 4 MG tablet Take 1 tablet (4 mg total) by mouth every 8 (eight) hours as needed for muscle spasms. 04/16/19   Mcarthur Rossetti, MD  Vitamin D, Ergocalciferol, (DRISDOL) 1.25 MG (50000 UT) CAPS capsule Take 50,000 Units by mouth once a week. 02/05/19   [provider]    Allergies    Patient has no known allergies.  Review of Systems   Review of Systems  All other systems reviewed and are negative.   Physical Exam Updated Vital Signs BP 116/86 (BP Location: Right Arm)   Pulse (!) 109   Temp 99.2 F (37.3 C) (Oral)   Resp (!) 24   Ht  1.727 m (5\' 8" )   Wt 126.1 kg   SpO2 (!) 81%   BMI 42.27 kg/m   Physical Exam Vitals and nursing note reviewed.  Constitutional:      General: He is not in acute distress.    Appearance: He is well-developed.  HENT:     Head: Normocephalic and atraumatic.     Mouth/Throat:     Pharynx: No oropharyngeal exudate.  Eyes:     General: No scleral icterus.       Right eye: No discharge.        Left eye: No discharge.     Conjunctiva/sclera: Conjunctivae normal.     Pupils: Pupils are equal, round, and reactive to light.  Neck:     Thyroid: No thyromegaly.     Vascular: No JVD.  Cardiovascular:  Rate and Rhythm: Regular rhythm. Tachycardia present.     Heart sounds: Normal heart sounds. No murmur. No friction rub. No gallop.   Pulmonary:     Breath sounds: Normal breath sounds. No wheezing or rales.     Comments: The patient is able to speak in relatively normal sentences, mildly tachypneic but not using accessory muscles.  No coughing, no rales, no wheezing Abdominal:     General: Bowel sounds are normal. There is no distension.     Palpations: Abdomen is soft. There is no mass.     Tenderness: There is no abdominal tenderness.  Musculoskeletal:        General: No tenderness. Normal range of motion.     Cervical back: Normal range of motion and neck supple.  Lymphadenopathy:     Cervical: No cervical adenopathy.  Skin:    General: Skin is warm and dry.     Findings: No erythema or rash.  Neurological:     Mental Status: He is alert.     Coordination: Coordination normal.  Psychiatric:        Behavior: Behavior normal.     ED Results / Procedures / Treatments   Labs (all labs ordered are listed, but only abnormal results are displayed) Labs Reviewed  CULTURE, BLOOD (ROUTINE X 2)  CULTURE, BLOOD (ROUTINE X 2)  SARS CORONAVIRUS 2 (TAT 6-24 HRS)  LACTIC ACID, PLASMA  LACTIC ACID, PLASMA  CBC WITH DIFFERENTIAL/PLATELET  COMPREHENSIVE METABOLIC PANEL  D-DIMER,  QUANTITATIVE (NOT AT [employer])  PROCALCITONIN  LACTATE DEHYDROGENASE  FERRITIN  TRIGLYCERIDES  FIBRINOGEN  C-REACTIVE PROTEIN  POC SARS CORONAVIRUS 2 AG -  ED    EKG EKG Interpretation  Date/Time:  Wednesday August 29 2019 11:00:50 EST Ventricular Rate:  99 PR Interval:    QRS Duration: 75 QT Interval:  311 QTC Calculation: 399 R Axis:   48 Text Interpretation: Sinus rhythm Borderline T wave abnormalities non specific T waves are new findings-  otherwise, no sig changes Confirmed by Noemi Chapel [id number]) on 08/29/2019 11:05:01 AM   Radiology DG Chest Port 1 View  Result Date: 08/29/2019 CLINICAL DATA:  58 year old male with cough and hypoxia. COVID-19 status pending. EXAM: PORTABLE CHEST 1 VIEW COMPARISON:  Chest radiographs 04/18/2018 and earlier. FINDINGS: Mild chronic peripheral and slightly asymmetric bilateral lung opacity seen in 2019. Stable lung volumes and mediastinal contours. Negative trachea. Evidence of upper lobe emphysema, a especially on the right, where now there is superimposed more confluent pulmonary ground-glass opacity. Increased patchy and indistinct left upper and mid lung opacity also superimposed on the chronic density. No pneumothorax or pleural effusion. Paucity of bowel gas. No acute osseous abnormality identified. IMPRESSION: Chronic lung disease with evidence of Emphysema (ICD10-J43.9). Superimposed bilateral new patchy and indistinct pulmonary opacity compatible with acute viral/atypical respiratory infection and suspicious for COVID-19 pneumonia. No pleural effusion. a Electronically Signed   By: Genevie Ann M.D.   On: 08/29/2019 11:31    Procedures . Angiocath insertion Performed by: Johnna Acosta  Consent: Verbal consent obtained. Risks and benefits: risks, benefits and alternatives were discussed Time out: Immediately prior to procedure a "time out" was called to verify the correct patient, procedure, equipment, support staff and site/side marked as  required.  Preparation: Patient was prepped and draped in the usual sterile fashion.  Vein Location: R wrist  Not Ultrasound Guided  Gauge: 18  Normal blood return and flush without difficulty Patient tolerance: Patient tolerated the procedure well with no immediate complications.    Marland Kitchen  Critical Care Performed by: Noemi Chapel, MD Authorized by: Noemi Chapel, MD   Critical care provider statement:    Critical care time (minutes):  35   Critical care time was exclusive of:  Separately billable procedures and treating other patients and teaching time   Critical care was necessary to treat or prevent imminent or life-threatening deterioration of the following conditions:  Respiratory failure   Critical care was time spent personally by me on the following activities:  Blood draw for specimens, development of treatment plan with patient or surrogate, discussions with consultants, evaluation of patient's response to treatment, examination of patient, obtaining history from patient or surrogate, ordering and performing treatments and interventions, ordering and review of laboratory studies, ordering and review of radiographic studies, pulse oximetry, re-evaluation of patient's condition and review of old charts   (including critical care time)  Medications Ordered in ED Medications - No data to display  ED Course  I have reviewed the triage vital signs and the nursing notes.  Pertinent labs & imaging results that were available during my care of the patient were reviewed by me and considered in my medical decision making (see chart for details).  Clinical Course as of Aug 28 1418  Wed Aug 29, 2019  1150 I have personally viewed the x-ray, there are changes of COPD as well as multifocal infiltrates.  I am concerned about this patchy multifocal appearance to be consistent with coronavirus pneumonia.  Labs pending, patient will be placed on oxygen and placed under precautions   [BM]    1407 The patient has what appears to be an acute kidney injury with a creatinine of 3.2, former was 1.7 or thereabouts, BUN is elevated at 34.  Urinalysis pending.  Will admit to the hospitalist   [BM]  1418 Rapid Covid test positive, will admit   [BM]    Clinical Course User Index [BM] Noemi Chapel, MD   MDM Rules/Calculators/A&P                      The patient has a nontender but obese abdomen, no edema, no rales, no wheezing, vital signs reflect hypoxia with an oxygen between 81 and 86% on room air with mild tachypnea and tachycardia.  He has no fever however I am concerned about coronavirus given his exposure.  He will be tested, chest x-ray will be performed, if he is truly hypoxic you will need to be admitted to the hospital  The patient is critically ill with acute hypoxic respiratory failure who will need admission to the hospital.  Pending Covid test now  I had to place a peripheral IV due to difficult access, nursing unable to get IV, I successfully placed one in the right wrist with blood draw, 18-gauge, no ultrasound needed.  The patient tolerated this procedure well.  At the time of admission the patient has been noted to have no leukocytosis, no fever, no tachycardia however he has hypoxic and requiring oxygen likely from Covid pneumonia.  His lactic acid is less than 2, his ABG shows no acidosis, his coronavirus test is positive and he has acute renal failure for an unknown cause.  Urinalysis pending, will discuss with the hospitalist for admission.  Critical care provided  D/w Dr. Manuella Ghazi with hospitalists - will admit   Final Clinical Impression(s) / ED Diagnoses Final diagnoses:  Acute respiratory failure with hypoxia ([place])  Pneumonia due to COVID-19 virus  Acute renal failure, unspecified acute renal failure type (  [place])      Noemi Chapel, MD 08/29/19 1435

## 2019-08-30 ENCOUNTER — Encounter (HOSPITAL_COMMUNITY): Payer: Self-pay | Admitting: Internal Medicine

## 2019-08-30 LAB — COMPREHENSIVE METABOLIC PANEL
ALT: 55 U/L — ABNORMAL HIGH (ref 0–44)
AST: 36 U/L (ref 15–41)
Albumin: 3.1 g/dL — ABNORMAL LOW (ref 3.5–5.0)
Alkaline Phosphatase: 85 U/L (ref 38–126)
Anion gap: 11 (ref 5–15)
BUN: 35 mg/dL — ABNORMAL HIGH (ref 6–20)
CO2: 23 mmol/L (ref 22–32)
Calcium: 9.2 mg/dL (ref 8.9–10.3)
Chloride: 104 mmol/L (ref 98–111)
Creatinine, Ser: 2.52 mg/dL — ABNORMAL HIGH (ref 0.61–1.24)
GFR calc Af Amer: 31 mL/min — ABNORMAL LOW (ref >60)
GFR calc non Af Amer: 27 mL/min — ABNORMAL LOW (ref >60)
Glucose, Bld: 121 mg/dL — ABNORMAL HIGH (ref 70–99)
Potassium: 5.1 mmol/L (ref 3.5–5.1)
Sodium: 138 mmol/L (ref 135–145)
Total Bilirubin: 0.4 mg/dL (ref 0.3–1.2)
Total Protein: 8.1 g/dL (ref 6.5–8.1)

## 2019-08-30 LAB — CBC WITH DIFFERENTIAL/PLATELET
Abs Immature Granulocytes: 0.18 10*3/uL — ABNORMAL HIGH (ref 0.00–0.07)
Basophils Absolute: 0 10*3/uL (ref 0.0–0.1)
Basophils Relative: 0 %
Eosinophils Absolute: 0 10*3/uL (ref 0.0–0.5)
Eosinophils Relative: 0 %
HCT: 40.3 % (ref 39.0–52.0)
Hemoglobin: 12.4 g/dL — ABNORMAL LOW (ref 13.0–17.0)
Immature Granulocytes: 3 %
Lymphocytes Relative: 7 %
Lymphs Abs: 0.5 10*3/uL — ABNORMAL LOW (ref 0.7–4.0)
MCH: 27.4 pg (ref 26.0–34.0)
MCHC: 30.8 g/dL (ref 30.0–36.0)
MCV: 89 fL (ref 80.0–100.0)
Monocytes Absolute: 0.3 10*3/uL (ref 0.1–1.0)
Monocytes Relative: 5 %
Neutro Abs: 5.7 10*3/uL (ref 1.7–7.7)
Neutrophils Relative %: 85 %
Platelets: 269 10*3/uL (ref 150–400)
RBC: 4.53 MIL/uL (ref 4.22–5.81)
RDW: 15.1 % (ref 11.5–15.5)
WBC: 6.6 10*3/uL (ref 4.0–10.5)
nRBC: 0 % (ref 0.0–0.2)

## 2019-08-30 LAB — D-DIMER, QUANTITATIVE: D-Dimer, Quant: 0.76 ug/mL-FEU — ABNORMAL HIGH (ref 0.00–0.50)

## 2019-08-30 LAB — C-REACTIVE PROTEIN: CRP: 12.3 mg/dL — ABNORMAL HIGH (ref <1.0)

## 2019-08-30 LAB — SARS CORONAVIRUS 2 (TAT 6-24 HRS): SARS Coronavirus 2: POSITIVE — AB

## 2019-08-30 LAB — MAGNESIUM: Magnesium: 2.4 mg/dL (ref 1.7–2.4)

## 2019-08-30 NOTE — Progress Notes (Signed)
CRITICAL VALUE ALERT  Critical Value:  Blood culture gram positive cocci  Date & Time Notied:  08/30/2019, 3785  Provider Notified: Manuella Ghazi  Orders Received/Actions taken: awaiting further orders

## 2019-08-30 NOTE — Progress Notes (Signed)
PROGRESS NOTE    Mathew Lara  TMH:[id number] DOB: 12-08-1960 DOA: 08/29/2019 PCP: [name], MD   Brief Narrative:  Per HPI: Mathew Lara is a 58 y.o. male with medical history significant for COPD, hepatitis C, alcohol abuse, hypertension, and stage III CKD who presented to the ED with complaints of some nausea as well as headache.  He denies any fevers or chills and states that he is having some shortness of breath as well.  He denies any abdominal pain, coughing, or diarrhea.  He had seen his primary care doctor who had given him some nausea medication but this has not helped him.  He has come into contact with sick people at work with coronavirus as well.  12/24: Patient was admitted with acute hypoxemic respiratory failure secondary to COVID-19 pneumonia and continues to have ongoing improvement this morning.  He still remains on 2-3 L nasal cannula oxygen, but is overall feeling much better.  He denies any further nausea.  AKI is improving with IV fluid.  Assessment & Plan:   Active Problems:   Acute hypoxemic respiratory failure due to COVID-19 [facility])   Acute hypoxemic respiratory failure secondary to COVID-19 pneumonia -Continue to monitor repeat inflammatory markers -Initiate dexamethasone as well as remdesivir -Considering Actemra for CRP elevation after further discussion with physicians at [facility] -Albuterol inhaler as needed -Antitussives as needed -Antiemetics -Wean oxygen as tolerated  AKI on CKD stage III-improving -Continue on IV fluid and monitor repeat labs -Likely secondary to some dehydration related to nausea  Hypertension -Continue home medications  Dyslipidemia -Continue home medications  COPD -Albuterol inhalers as needed -Does not appear to be having any acute bronchospasm currently  Gram-positive cocci in 1 set of blood culture -Continue monitor closely as this may be contaminant   DVT prophylaxis:Heparin Code Status: Full Family  Communication: Discussed with wife on phone Disposition Plan: Continue current Covid 19 treatment as well as ongoing IV fluid for AKI which is improving   Consultants:   None  Procedures:   None  Antimicrobials:  Anti-infectives (From admission, onward)   Start     Dose/Rate Route Frequency Ordered Stop   08/30/19 1000  remdesivir 100 mg in sodium chloride 0.9 % 100 mL IVPB     100 mg 200 mL/hr over 30 Minutes Intravenous Daily 08/29/19 1507 09/03/19 0959   08/29/19 1530  remdesivir 200 mg in sodium chloride 0.9% 250 mL IVPB     200 mg 580 mL/hr over 30 Minutes Intravenous Once 08/29/19 1507 08/29/19 2018       Subjective: Patient seen and evaluated today with no new acute complaints or concerns. No acute concerns or events noted overnight.  He states that he is starting to feel somewhat better.  Denies any further nausea.  Objective: Vitals:   08/29/19 1745 08/29/19 2015 08/29/19 2117 08/30/19 0606  BP:   120/74 122/84  Pulse: 83 83 84 81  Resp:   (!) 24 (!) 22  Temp:   97.7 F (36.5 C) 97.7 F (36.5 C)  TempSrc:   Oral Oral  SpO2: 95% 92% 94% 94%  Weight:   120.9 kg   Height:        Intake/Output Summary (Last 24 hours) at 08/30/2019 1101 Last data filed at 08/30/2019 0900 Gross per 24 hour  Intake 1690.8 ml  Output 120 ml  Net 1570.8 ml   Filed Weights   08/29/19 1046 08/29/19 2117  Weight: 126.1 kg 120.9 kg    Examination:  General exam:  Appears calm and comfortable  Respiratory system: Clear to auscultation. Respiratory effort normal.  Continues on 2-3 L nasal cannula oxygen. Cardiovascular system: S1 & S2 heard, RRR. No JVD, murmurs, rubs, gallops or clicks. No pedal edema. Gastrointestinal system: Abdomen is nondistended, soft and nontender. No organomegaly or masses felt. Normal bowel sounds heard. Central nervous system: Alert and oriented. No focal neurological deficits. Extremities: Symmetric 5 x 5 power. Skin: No rashes, lesions or  ulcers Psychiatry: Judgement and insight appear normal. Mood & affect appropriate.     Data Reviewed: I have personally reviewed following labs and imaging studies  CBC: Recent Labs  Lab 08/29/19 1155 08/30/19 0614  WBC 7.5 6.6  NEUTROABS 6.0 5.7  HGB 12.5* 12.4*  HCT 40.4 40.3  MCV 89.6 89.0  PLT 261 932   Basic Metabolic Panel: Recent Labs  Lab 08/29/19 1155 08/30/19 0614  NA 137 138  K 4.0 5.1  CL 102 104  CO2 21* 23  GLUCOSE 99 121*  BUN 34* 35*  CREATININE 3.23* 2.52*  CALCIUM 9.0 9.2  MG  --  2.4   GFR: Estimated Creatinine Clearance: 39.9 mL/min (A) (by C-G formula based on SCr of 2.52 mg/dL (H)). Liver Function Tests: Recent Labs  Lab 08/29/19 1155 08/30/19 0614  AST 55* 36  ALT 71* 55*  ALKPHOS 92 85  BILITOT 0.9 0.4  PROT 8.6* 8.1  ALBUMIN 3.5 3.1*   No results for input(s): LIPASE, AMYLASE in the last 168 hours. No results for input(s): AMMONIA in the last 168 hours. Coagulation Profile: No results for input(s): INR, PROTIME in the last 168 hours. Cardiac Enzymes: No results for input(s): CKTOTAL, CKMB, CKMBINDEX, TROPONINI in the last 168 hours. BNP (last 3 results) No results for input(s): PROBNP in the last 8760 hours. HbA1C: No results for input(s): HGBA1C in the last 72 hours. CBG: No results for input(s): GLUCAP in the last 168 hours. Lipid Profile: Recent Labs    08/29/19 1155  TRIG 199*   Thyroid Function Tests: No results for input(s): TSH, T4TOTAL, FREET4, T3FREE, THYROIDAB in the last 72 hours. Anemia Panel: Recent Labs    08/29/19 1155  FERRITIN 479*   Sepsis Labs: Recent Labs  Lab 08/29/19 1155 08/29/19 1235  PROCALCITON 0.11  --   LATICACIDVEN 1.9 0.9    Recent Results (from the past 240 hour(s))  SARS CORONAVIRUS 2 (TAT 6-24 HRS) Nasopharyngeal Nasopharyngeal Swab     Status: Abnormal   Collection Time: 08/29/19 10:59 AM   Specimen: Nasopharyngeal Swab  Result Value Ref Range Status   SARS Coronavirus 2  POSITIVE (A) NEGATIVE Final    Comment: RESULT CALLED TO, READ BACK BY AND VERIFIED WITH: C.ROSE,RN 0448 08/30/19 G.MCADOO (NOTE) SARS-CoV-2 target nucleic acids are DETECTED. The SARS-CoV-2 RNA is generally detectable in upper and lower respiratory specimens during the acute phase of infection. Positive results are indicative of the presence of SARS-CoV-2 RNA. Clinical correlation with patient history and other diagnostic information is  necessary to determine patient infection status. Positive results do not rule out bacterial infection or co-infection with other viruses.  The expected result is Negative. Fact Sheet for Patients: SugarRoll.be Fact Sheet for Healthcare Providers: https://www.woods-mathews.com/ This test is not yet approved or cleared by the [place] FDA and  has been authorized for detection and/or diagnosis of SARS-CoV-2 by FDA under an Emergency Use Authorization (EUA). This EUA will remain  in effect (meaning this test can be used) for the du ration of the COVID-19 declaration  under Section 564(b)(1) of the Act, 21 U.S.C. section 360bbb-3(b)(1), unless the authorization is terminated or revoked sooner. Performed at [facility], [place] [address] [place], [place] [address]   Blood Culture (routine x 2)     Status: None (Preliminary result)   Collection Time: 08/29/19 11:55 AM   Specimen: BLOOD LEFT HAND  Result Value Ref Range Status   Specimen Description BLOOD LEFT HAND  Final   Special Requests   Final    BOTTLES DRAWN AEROBIC AND ANAEROBIC Blood Culture adequate volume   Culture   Final    NO GROWTH < 24 HOURS Performed at [facility], [address] [place], [place] [address]    Report Status PENDING  Incomplete  Blood Culture (routine x 2)     Status: None (Preliminary result)   Collection Time: 08/29/19 11:55 AM   Specimen: BLOOD LEFT ARM  Result Value Ref Range Status   Specimen Description BLOOD  LEFT ARM  Final   Special Requests   Final    BOTTLES DRAWN AEROBIC AND ANAEROBIC Blood Culture adequate volume   Culture  Setup Time   Final    GRAM POSITIVE COCCI ANAEROBIC BOTTLE Gram Stain Report Called to,Read Back By and Verified With: BENGSTON,[email or web address]  BY MATTHEWS,B 12.24.2020 [facility]    Culture   Final    NO GROWTH < 24 HOURS Performed at [facility], [address], [place], [place] [address]    Report Status PENDING  Incomplete         Radiology Studies: DG Chest Port 1 View  Result Date: 08/29/2019 CLINICAL DATA:  58 year old male with cough and hypoxia. COVID-19 status pending. EXAM: PORTABLE CHEST 1 VIEW COMPARISON:  Chest radiographs 04/18/2018 and earlier. FINDINGS: Mild chronic peripheral and slightly asymmetric bilateral lung opacity seen in 2019. Stable lung volumes and mediastinal contours. Negative trachea. Evidence of upper lobe emphysema, a especially on the right, where now there is superimposed more confluent pulmonary ground-glass opacity. Increased patchy and indistinct left upper and mid lung opacity also superimposed on the chronic density. No pneumothorax or pleural effusion. Paucity of bowel gas. No acute osseous abnormality identified. IMPRESSION: Chronic lung disease with evidence of Emphysema (ICD10-J43.9). Superimposed bilateral new patchy and indistinct pulmonary opacity compatible with acute viral/atypical respiratory infection and suspicious for COVID-19 pneumonia. No pleural effusion. a Electronically Signed   By: Genevie Ann M.D.   On: 08/29/2019 11:31        Scheduled Meds: . amLODipine  10 mg Oral Daily  . aspirin  81 mg Oral BID  . dexamethasone (DECADRON) injection  6 mg Intravenous Q24H  . fenofibrate  160 mg Oral Daily  . heparin  5,000 Units Subcutaneous Q8H  . loratadine  10 mg Oral Daily   Continuous Infusions: . lactated ringers 100 mL/hr (08/30/19 0552)  . remdesivir 100 mg in NS 100 mL 100 mg (08/30/19 1030)     LOS: 1  day    Time spent: 30 minutes    Mohmmad Saleeby Darleen Crocker, DO [facility] Pager [phone]  If 7PM-7AM, please contact night-coverage [email or web address] Password [email or web address] 08/30/2019, 11:01 AM

## 2019-08-30 NOTE — Plan of Care (Signed)
  Problem: Education: Goal: Knowledge of risk factors and measures for prevention of condition will improve Outcome: Progressing   Problem: Respiratory: Goal: Will maintain a patent airway Outcome: Progressing   

## 2019-08-30 NOTE — Progress Notes (Signed)
O2 sats WDL on room air at rest, decreased to 85% on room air while ambulating. Patient denied SOB. O2 @ 2L applied, sats increased to 96% at rest. Will continue to monitor.

## 2019-08-31 LAB — COMPREHENSIVE METABOLIC PANEL
ALT: 45 U/L — ABNORMAL HIGH (ref 0–44)
AST: 29 U/L (ref 15–41)
Albumin: 2.9 g/dL — ABNORMAL LOW (ref 3.5–5.0)
Alkaline Phosphatase: 79 U/L (ref 38–126)
Anion gap: 9 (ref 5–15)
BUN: 28 mg/dL — ABNORMAL HIGH (ref 6–20)
CO2: 25 mmol/L (ref 22–32)
Calcium: 9.1 mg/dL (ref 8.9–10.3)
Chloride: 105 mmol/L (ref 98–111)
Creatinine, Ser: 1.84 mg/dL — ABNORMAL HIGH (ref 0.61–1.24)
GFR calc Af Amer: 45 mL/min — ABNORMAL LOW (ref >60)
GFR calc non Af Amer: 39 mL/min — ABNORMAL LOW (ref >60)
Glucose, Bld: 146 mg/dL — ABNORMAL HIGH (ref 70–99)
Potassium: 4.5 mmol/L (ref 3.5–5.1)
Sodium: 139 mmol/L (ref 135–145)
Total Bilirubin: 0.3 mg/dL (ref 0.3–1.2)
Total Protein: 7.7 g/dL (ref 6.5–8.1)

## 2019-08-31 LAB — D-DIMER, QUANTITATIVE: D-Dimer, Quant: 0.73 ug/mL-FEU — ABNORMAL HIGH (ref 0.00–0.50)

## 2019-08-31 LAB — CBC WITH DIFFERENTIAL/PLATELET
Abs Immature Granulocytes: 0.41 10*3/uL — ABNORMAL HIGH (ref 0.00–0.07)
Basophils Absolute: 0 10*3/uL (ref 0.0–0.1)
Basophils Relative: 0 %
Eosinophils Absolute: 0 10*3/uL (ref 0.0–0.5)
Eosinophils Relative: 0 %
HCT: 40.3 % (ref 39.0–52.0)
Hemoglobin: 12.1 g/dL — ABNORMAL LOW (ref 13.0–17.0)
Immature Granulocytes: 4 %
Lymphocytes Relative: 7 %
Lymphs Abs: 0.7 10*3/uL (ref 0.7–4.0)
MCH: 27.1 pg (ref 26.0–34.0)
MCHC: 30 g/dL (ref 30.0–36.0)
MCV: 90.2 fL (ref 80.0–100.0)
Monocytes Absolute: 0.7 10*3/uL (ref 0.1–1.0)
Monocytes Relative: 7 %
Neutro Abs: 8 10*3/uL — ABNORMAL HIGH (ref 1.7–7.7)
Neutrophils Relative %: 82 %
Platelets: 315 10*3/uL (ref 150–400)
RBC: 4.47 MIL/uL (ref 4.22–5.81)
RDW: 14.9 % (ref 11.5–15.5)
WBC: 9.8 10*3/uL (ref 4.0–10.5)
nRBC: 0 % (ref 0.0–0.2)

## 2019-08-31 LAB — C-REACTIVE PROTEIN: CRP: 5.4 mg/dL — ABNORMAL HIGH (ref <1.0)

## 2019-08-31 LAB — MAGNESIUM: Magnesium: 2 mg/dL (ref 1.7–2.4)

## 2019-08-31 MED ORDER — ALBUTEROL SULFATE HFA 108 (90 BASE) MCG/ACT IN AERS
1.0000 | INHALATION_SPRAY | Freq: Four times a day (QID) | RESPIRATORY_TRACT | Status: DC
  Administered 2019-08-31 – 2019-09-01 (×6): 2 via RESPIRATORY_TRACT
  Filled 2019-08-31: qty 6.7

## 2019-08-31 NOTE — Progress Notes (Signed)
PROGRESS NOTE    Anthonyjames Bargar  PJK:[id number] DOB: Dec 05, 1960 DOA: 08/29/2019 PCP: [name], MD   Brief Narrative:  Per HPI: [name] a 58 y.o.malewith medical history significant forCOPD,hepatitis C, alcohol abuse, hypertension,and stage III CKD who presented to the ED with complaints of some nausea as well as headache. He denies any fevers or chills and states that he is having some shortness of breath as well. He denies any abdominal pain, coughing, or diarrhea. He had seen his primary care doctor who had given him some nausea medication but this has not helped him. He has come into contact with sick people at work with coronavirus as well.  12/24: Patient was admitted with acute hypoxemic respiratory failure secondary to COVID-19 pneumonia and continues to have ongoing improvement this morning.  He still remains on 2-3 L nasal cannula oxygen, but is overall feeling much better.  He denies any further nausea.  AKI is improving with IV fluid.  12/25: Patient continues to do well this morning and remains on 2 L nasal cannula oxygen.  No worsening shortness of breath noted.  Assessment & Plan:   Active Problems:   Acute hypoxemic respiratory failure due to COVID-19 [facility])   Acute hypoxemic respiratory failure secondary to COVID-19 pneumonia -Continue to monitor repeat inflammatory markers -Initiate dexamethasone as well as remdesivir -No need for Actemra at this time -Albuterol inhaler as needed -Antitussives as needed -Antiemetics -Wean oxygen as tolerated and use incentive spirometry  AKI on CKD stage III-improving -Continue IV fluid for now with baseline creatinine noted near 1.4 -Likely secondary to some dehydration related to nausea  Hypertension -Continue home medications  Dyslipidemia -Continue home medications  COPD -Albuterol inhalers as needed -Does not appear to be having any acute bronchospasm currently  Gram-positive cocci in 1  set of blood culture -This is coagulase-negative and appears to be a contaminant   DVT prophylaxis:Heparin Code Status: Full Family Communication: Discussed with wife on phone on 12/24 Disposition Plan: Continue current Covid 19 treatment as well as ongoing IV fluid for AKI which is improving.  Monitor repeat labs.   Consultants:   None  Procedures:   None  Antimicrobials:  Anti-infectives (From admission, onward)   Start     Dose/Rate Route Frequency Ordered Stop   08/30/19 1000  remdesivir 100 mg in sodium chloride 0.9 % 100 mL IVPB     100 mg 200 mL/hr over 30 Minutes Intravenous Daily 08/29/19 1507 09/03/19 0959   08/29/19 1530  remdesivir 200 mg in sodium chloride 0.9% 250 mL IVPB     200 mg 580 mL/hr over 30 Minutes Intravenous Once 08/29/19 1507 08/29/19 2018       Subjective: Patient seen and evaluated today with no new acute complaints or concerns. No acute concerns or events noted overnight.  He continues to remain on 2 L nasal cannula oxygen.  No worsening shortness of breath noted.  Objective: Vitals:   08/30/19 1347 08/30/19 2125 08/31/19 0547 08/31/19 0919  BP: 112/77 131/88 126/90 121/80  Pulse: 81 86 89 74  Resp: 19  16 16   Temp: 97.9 F (36.6 C) 98.5 F (36.9 C) 97.8 F (36.6 C) 97.7 F (36.5 C)  TempSrc: Oral Oral Oral Oral  SpO2: 92% 95% 97% 97%  Weight:      Height:        Intake/Output Summary (Last 24 hours) at 08/31/2019 1302 Last data filed at 08/31/2019 0500 Gross per 24 hour  Intake 2554.67 ml  Output 1505  ml  Net 1049.67 ml   Filed Weights   08/29/19 1046 08/29/19 2117  Weight: 126.1 kg 120.9 kg    Examination:  General exam: Appears calm and comfortable  Respiratory system: Clear to auscultation. Respiratory effort normal.  Continues on 2 L nasal cannula oxygen. Cardiovascular system: S1 & S2 heard, RRR. No JVD, murmurs, rubs, gallops or clicks. No pedal edema. Gastrointestinal system: Abdomen is nondistended, soft  and nontender. No organomegaly or masses felt. Normal bowel sounds heard. Central nervous system: Alert and oriented. No focal neurological deficits. Extremities: Symmetric 5 x 5 power. Skin: No rashes, lesions or ulcers Psychiatry: Judgement and insight appear normal. Mood & affect appropriate.     Data Reviewed: I have personally reviewed following labs and imaging studies  CBC: Recent Labs  Lab 08/29/19 1155 08/30/19 0614 08/31/19 0754  WBC 7.5 6.6 9.8  NEUTROABS 6.0 5.7 8.0*  HGB 12.5* 12.4* 12.1*  HCT 40.4 40.3 40.3  MCV 89.6 89.0 90.2  PLT 261 269 789   Basic Metabolic Panel: Recent Labs  Lab 08/29/19 1155 08/30/19 0614 08/31/19 0754  NA 137 138 139  K 4.0 5.1 4.5  CL 102 104 105  CO2 21* 23 25  GLUCOSE 99 121* 146*  BUN 34* 35* 28*  CREATININE 3.23* 2.52* 1.84*  CALCIUM 9.0 9.2 9.1  MG  --  2.4 2.0   GFR: Estimated Creatinine Clearance: 54.7 mL/min (A) (by C-G formula based on SCr of 1.84 mg/dL (H)). Liver Function Tests: Recent Labs  Lab 08/29/19 1155 08/30/19 0614 08/31/19 0754  AST 55* 36 29  ALT 71* 55* 45*  ALKPHOS 92 85 79  BILITOT 0.9 0.4 0.3  PROT 8.6* 8.1 7.7  ALBUMIN 3.5 3.1* 2.9*   No results for input(s): LIPASE, AMYLASE in the last 168 hours. No results for input(s): AMMONIA in the last 168 hours. Coagulation Profile: No results for input(s): INR, PROTIME in the last 168 hours. Cardiac Enzymes: No results for input(s): CKTOTAL, CKMB, CKMBINDEX, TROPONINI in the last 168 hours. BNP (last 3 results) No results for input(s): PROBNP in the last 8760 hours. HbA1C: No results for input(s): HGBA1C in the last 72 hours. CBG: No results for input(s): GLUCAP in the last 168 hours. Lipid Profile: Recent Labs    08/29/19 1155  TRIG 199*   Thyroid Function Tests: No results for input(s): TSH, T4TOTAL, FREET4, T3FREE, THYROIDAB in the last 72 hours. Anemia Panel: Recent Labs    08/29/19 1155  FERRITIN 479*   Sepsis Labs: Recent  Labs  Lab 08/29/19 1155 08/29/19 1235  PROCALCITON 0.11  --   LATICACIDVEN 1.9 0.9    Recent Results (from the past 240 hour(s))  SARS CORONAVIRUS 2 (TAT 6-24 HRS) Nasopharyngeal Nasopharyngeal Swab     Status: Abnormal   Collection Time: 08/29/19 10:59 AM   Specimen: Nasopharyngeal Swab  Result Value Ref Range Status   SARS Coronavirus 2 POSITIVE (A) NEGATIVE Final    Comment: RESULT CALLED TO, READ BACK BY AND VERIFIED WITH: C.ROSE,RN 0448 08/30/19 G.MCADOO (NOTE) SARS-CoV-2 target nucleic acids are DETECTED. The SARS-CoV-2 RNA is generally detectable in upper and lower respiratory specimens during the acute phase of infection. Positive results are indicative of the presence of SARS-CoV-2 RNA. Clinical correlation with patient history and other diagnostic information is  necessary to determine patient infection status. Positive results do not rule out bacterial infection or co-infection with other viruses.  The expected result is Negative. Fact Sheet for Patients: SugarRoll.be Fact Sheet for Healthcare  Providers: https://www.woods-mathews.com/ This test is not yet approved or cleared by the [place] and  has been authorized for detection and/or diagnosis of SARS-CoV-2 by FDA under an Emergency Use Authorization (EUA). This EUA will remain  in effect (meaning this test can be used) for the du ration of the COVID-19 declaration under Section 564(b)(1) of the Act, 21 U.S.C. section 360bbb-3(b)(1), unless the authorization is terminated or revoked sooner. Performed at [facility], [place] [address] [place], [place] [address]   Blood Culture (routine x 2)     Status: None (Preliminary result)   Collection Time: 08/29/19 11:55 AM   Specimen: BLOOD LEFT HAND  Result Value Ref Range Status   Specimen Description BLOOD LEFT HAND  Final   Special Requests   Final    BOTTLES DRAWN AEROBIC AND ANAEROBIC Blood Culture adequate  volume   Culture   Final    NO GROWTH 2 DAYS Performed at [facility], [address], [place], [place] [address]    Report Status PENDING  Incomplete  Blood Culture (routine x 2)     Status: Abnormal (Preliminary result)   Collection Time: 08/29/19 11:55 AM   Specimen: BLOOD LEFT ARM  Result Value Ref Range Status   Specimen Description   Final    BLOOD LEFT ARM Performed at [facility], [address] [place], [place] [address]    Special Requests   Final    BOTTLES DRAWN AEROBIC AND ANAEROBIC Blood Culture adequate volume Performed at [facility], [address], [place], [place] [address]    Culture  Setup Time   Final    GRAM POSITIVE COCCI ANAEROBIC BOTTLE Gram Stain Report Called to,Read Back By and Verified With: BENGSTON,[email or web address]  BY MATTHEWS,B 12.24.2020 [facility] Performed at [facility], [address], [place], [place] [address]    Culture STAPHYLOCOCCUS SPECIES (COAGULASE NEGATIVE) (A)  Final   Report Status PENDING  Incomplete         Radiology Studies: No results found.      Scheduled Meds: . albuterol  1-2 puff Inhalation Q6H  . amLODipine  10 mg Oral Daily  . aspirin  81 mg Oral BID  . dexamethasone (DECADRON) injection  6 mg Intravenous Q24H  . fenofibrate  160 mg Oral Daily  . heparin  5,000 Units Subcutaneous Q8H  . loratadine  10 mg Oral Daily   Continuous Infusions: . lactated ringers 100 mL/hr at 08/31/19 1218  . remdesivir 100 mg in NS 100 mL 100 mg (08/31/19 1018)     LOS: 2 days    Time spent: 30 minutes    Fremon Zacharia Darleen Crocker, DO [facility] Pager [phone]  If 7PM-7AM, please contact night-coverage [email or web address] Password [email or web address] 08/31/2019, 1:02 PM

## 2019-09-01 LAB — URINALYSIS, ROUTINE W REFLEX MICROSCOPIC
Bacteria, UA: NONE SEEN
Bilirubin Urine: NEGATIVE
Glucose, UA: 50 mg/dL — AB
Hgb urine dipstick: NEGATIVE
Ketones, ur: NEGATIVE mg/dL
Leukocytes,Ua: NEGATIVE
Nitrite: NEGATIVE
Protein, ur: 100 mg/dL — AB
Specific Gravity, Urine: 1.018 (ref 1.005–1.030)
pH: 5 (ref 5.0–8.0)

## 2019-09-01 LAB — CBC WITH DIFFERENTIAL/PLATELET
Abs Immature Granulocytes: 0.62 10*3/uL — ABNORMAL HIGH (ref 0.00–0.07)
Basophils Absolute: 0 10*3/uL (ref 0.0–0.1)
Basophils Relative: 0 %
Eosinophils Absolute: 0 10*3/uL (ref 0.0–0.5)
Eosinophils Relative: 0 %
HCT: 38.4 % — ABNORMAL LOW (ref 39.0–52.0)
Hemoglobin: 11.6 g/dL — ABNORMAL LOW (ref 13.0–17.0)
Immature Granulocytes: 5 %
Lymphocytes Relative: 7 %
Lymphs Abs: 0.8 10*3/uL (ref 0.7–4.0)
MCH: 27.3 pg (ref 26.0–34.0)
MCHC: 30.2 g/dL (ref 30.0–36.0)
MCV: 90.4 fL (ref 80.0–100.0)
Monocytes Absolute: 0.7 10*3/uL (ref 0.1–1.0)
Monocytes Relative: 6 %
Neutro Abs: 9.9 10*3/uL — ABNORMAL HIGH (ref 1.7–7.7)
Neutrophils Relative %: 82 %
Platelets: 325 10*3/uL (ref 150–400)
RBC: 4.25 MIL/uL (ref 4.22–5.81)
RDW: 15.2 % (ref 11.5–15.5)
WBC: 12 10*3/uL — ABNORMAL HIGH (ref 4.0–10.5)
nRBC: 0.2 % (ref 0.0–0.2)

## 2019-09-01 LAB — COMPREHENSIVE METABOLIC PANEL
ALT: 52 U/L — ABNORMAL HIGH (ref 0–44)
AST: 41 U/L (ref 15–41)
Albumin: 2.8 g/dL — ABNORMAL LOW (ref 3.5–5.0)
Alkaline Phosphatase: 75 U/L (ref 38–126)
Anion gap: 9 (ref 5–15)
BUN: 29 mg/dL — ABNORMAL HIGH (ref 6–20)
CO2: 24 mmol/L (ref 22–32)
Calcium: 8.7 mg/dL — ABNORMAL LOW (ref 8.9–10.3)
Chloride: 106 mmol/L (ref 98–111)
Creatinine, Ser: 1.95 mg/dL — ABNORMAL HIGH (ref 0.61–1.24)
GFR calc Af Amer: 42 mL/min — ABNORMAL LOW (ref >60)
GFR calc non Af Amer: 37 mL/min — ABNORMAL LOW (ref >60)
Glucose, Bld: 207 mg/dL — ABNORMAL HIGH (ref 70–99)
Potassium: 4.6 mmol/L (ref 3.5–5.1)
Sodium: 139 mmol/L (ref 135–145)
Total Bilirubin: 0.4 mg/dL (ref 0.3–1.2)
Total Protein: 7.2 g/dL (ref 6.5–8.1)

## 2019-09-01 LAB — C-REACTIVE PROTEIN: CRP: 2.5 mg/dL — ABNORMAL HIGH (ref <1.0)

## 2019-09-01 LAB — CULTURE, BLOOD (ROUTINE X 2): Special Requests: ADEQUATE

## 2019-09-01 LAB — MAGNESIUM: Magnesium: 1.8 mg/dL (ref 1.7–2.4)

## 2019-09-01 LAB — D-DIMER, QUANTITATIVE: D-Dimer, Quant: 0.53 ug/mL-FEU — ABNORMAL HIGH (ref 0.00–0.50)

## 2019-09-01 MED ORDER — ALBUTEROL SULFATE (2.5 MG/3ML) 0.083% IN NEBU: 3.0000 mL | INHALATION_SOLUTION | Freq: Four times a day (QID) | RESPIRATORY_TRACT | Status: DC | PRN

## 2019-09-01 MED ORDER — ASCORBIC ACID 500 MG PO TABS
500.0000 mg | ORAL_TABLET | Freq: Every day | ORAL | Status: DC
  Administered 2019-09-01 – 2019-09-02 (×2): 500 mg via ORAL
  Filled 2019-09-01 (×2): qty 1

## 2019-09-01 MED ORDER — ZINC SULFATE 220 (50 ZN) MG PO CAPS
220.0000 mg | ORAL_CAPSULE | Freq: Every day | ORAL | Status: DC
  Administered 2019-09-01 – 2019-09-02 (×2): 220 mg via ORAL
  Filled 2019-09-01 (×2): qty 1

## 2019-09-01 MED ORDER — ORAL CARE MOUTH RINSE
15.0000 mL | Freq: Two times a day (BID) | OROMUCOSAL | Status: DC
  Administered 2019-09-01 – 2019-09-02 (×3): 15 mL via OROMUCOSAL

## 2019-09-01 NOTE — Progress Notes (Signed)
PROGRESS NOTE    Mathew Lara  EHU:[id number] DOB: 1961/07/07 DOA: 08/29/2019 PCP: [name], MD   Brief Narrative:  Per HPI: Mathew Lara a 58 y.o.malewith medical history significant forCOPD,hepatitis C, alcohol abuse, hypertension,and stage III CKD who presented to the ED with complaints of some nausea as well as headache. He denies any fevers or chills and states that he is having some shortness of breath as well. He denies any abdominal pain, coughing, or diarrhea. He had seen his primary care doctor who had given him some nausea medication but this has not helped him. He has come into contact with sick people at work with coronavirus as well.  12/24:Patient was admitted with acute hypoxemic respiratory failure secondary to COVID-19 pneumonia and continues to have ongoing improvement this morning. He still remains on 2-3 L nasal cannula oxygen, but is overall feeling much better. He denies any further nausea. AKI is improving with IV fluid.  12/25: Patient continues to do well this morning and remains on 2 L nasal cannula oxygen.  No worsening shortness of breath noted.  12/26: Patient continues to do quite well this morning with less shortness of breath.  He continues to remain on 2 L nasal cannula oxygen, but this will be weaned as much as possible today.  His creatinine remains around 1.8-1.9 and I will continue IV fluid.  Recheck labs in a.m.  Complete full schedule of remdesivir by 12/27.  Assessment & Plan:   Active Problems:   Acute hypoxemic respiratory failure due to COVID-19 [facility])   Acute hypoxemic respiratory failure secondary to COVID-19 pneumonia -Continue to monitor repeat inflammatory markers with downtrend noted -Continue dexamethasone as well as remdesivir with the last dose noted to be 12/27 for remdesivir -Albuterol inhaler as needed -Antitussives as needed -Antiemetics -Wean oxygen as tolerated and use incentive spirometry  AKI on  CKD stage III--stable -Continue IV fluid for now with baseline creatinine noted near 1.4 -Likely secondary to some dehydration related to nausea -Repeat BMP in am  Hypertension -Continue home medications  Dyslipidemia -Continue home medications  COPD -Albuterol inhalers as needed -Does not appear to be having any acute bronchospasm currently  Gram-positive cocci in 1 set of blood culture -This is coagulase-negative and appears to be a contaminant   DVT prophylaxis:Heparin Code Status:Full Family Communication:Discussed withwife on phone on 12/24 Disposition Plan:Continue current Covid 19 treatment as well as ongoing IV fluid for AKI which is stable/improving.  Monitor repeat labs.  Last dose of remdesivir on 12/27 and potential consideration for discharge in the next 1-2 days potentially with home oxygen.   Consultants:  None  Procedures:  None  Antimicrobials:  Anti-infectives (From admission, onward)   Start     Dose/Rate Route Frequency Ordered Stop   08/30/19 1000  remdesivir 100 mg in sodium chloride 0.9 % 100 mL IVPB     100 mg 200 mL/hr over 30 Minutes Intravenous Daily 08/29/19 1507 09/01/19 0943   08/29/19 1530  remdesivir 200 mg in sodium chloride 0.9% 250 mL IVPB     200 mg 580 mL/hr over 30 Minutes Intravenous Once 08/29/19 1507 08/29/19 2018       Subjective: Patient seen and evaluated today with no new acute complaints or concerns. No acute concerns or events noted overnight.  He states that his shortness of breath is improving and he still is on 2L [place].  Objective: Vitals:   08/31/19 2200 09/01/19 0540 09/01/19 0830 09/01/19 0907  BP: 130/76 122/81 112/68 112/68  Pulse: 96 80 80   Resp: 18 16 17    Temp: 97.9 F (36.6 C) 97.8 F (36.6 C)    TempSrc: Oral Oral    SpO2: 94% 93% 94%   Weight:      Height:        Intake/Output Summary (Last 24 hours) at 09/01/2019 1008 Last data filed at 09/01/2019 0600 Gross per 24 hour   Intake 2970.2 ml  Output 1200 ml  Net 1770.2 ml   Filed Weights   08/29/19 1046 08/29/19 2117  Weight: 126.1 kg 120.9 kg    Examination:  General exam: Appears calm and comfortable  Respiratory system: Clear to auscultation. Respiratory effort normal. 2L [place]. Speaks in full sentences. Cardiovascular system: S1 & S2 heard, RRR. No JVD, murmurs, rubs, gallops or clicks. No pedal edema. Gastrointestinal system: Abdomen is nondistended, soft and nontender. No organomegaly or masses felt. Normal bowel sounds heard. Central nervous system: Alert and oriented. No focal neurological deficits. Extremities: Symmetric 5 x 5 power. Skin: No rashes, lesions or ulcers Psychiatry: Judgement and insight appear normal. Mood & affect appropriate.     Data Reviewed: I have personally reviewed following labs and imaging studies  CBC: Recent Labs  Lab 08/29/19 1155 08/30/19 0614 08/31/19 0754 09/01/19 0749  WBC 7.5 6.6 9.8 12.0*  NEUTROABS 6.0 5.7 8.0* 9.9*  HGB 12.5* 12.4* 12.1* 11.6*  HCT 40.4 40.3 40.3 38.4*  MCV 89.6 89.0 90.2 90.4  PLT 261 269 315 338   Basic Metabolic Panel: Recent Labs  Lab 08/29/19 1155 08/30/19 0614 08/31/19 0754 09/01/19 0749  NA 137 138 139 139  K 4.0 5.1 4.5 4.6  CL 102 104 105 106  CO2 21* 23 25 24   GLUCOSE 99 121* 146* 207*  BUN 34* 35* 28* 29*  CREATININE 3.23* 2.52* 1.84* 1.95*  CALCIUM 9.0 9.2 9.1 8.7*  MG  --  2.4 2.0 1.8   GFR: Estimated Creatinine Clearance: 51.6 mL/min (A) (by C-G formula based on SCr of 1.95 mg/dL (H)). Liver Function Tests: Recent Labs  Lab 08/29/19 1155 08/30/19 0614 08/31/19 0754 09/01/19 0749  AST 55* 36 29 41  ALT 71* 55* 45* 52*  ALKPHOS 92 85 79 75  BILITOT 0.9 0.4 0.3 0.4  PROT 8.6* 8.1 7.7 7.2  ALBUMIN 3.5 3.1* 2.9* 2.8*   No results for input(s): LIPASE, AMYLASE in the last 168 hours. No results for input(s): AMMONIA in the last 168 hours. Coagulation Profile: No results for input(s): INR, PROTIME  in the last 168 hours. Cardiac Enzymes: No results for input(s): CKTOTAL, CKMB, CKMBINDEX, TROPONINI in the last 168 hours. BNP (last 3 results) No results for input(s): PROBNP in the last 8760 hours. HbA1C: No results for input(s): HGBA1C in the last 72 hours. CBG: No results for input(s): GLUCAP in the last 168 hours. Lipid Profile: Recent Labs    08/29/19 1155  TRIG 199*   Thyroid Function Tests: No results for input(s): TSH, T4TOTAL, FREET4, T3FREE, THYROIDAB in the last 72 hours. Anemia Panel: Recent Labs    08/29/19 1155  FERRITIN 479*   Sepsis Labs: Recent Labs  Lab 08/29/19 1155 08/29/19 1235  PROCALCITON 0.11  --   LATICACIDVEN 1.9 0.9    Recent Results (from the past 240 hour(s))  SARS CORONAVIRUS 2 (TAT 6-24 HRS) Nasopharyngeal Nasopharyngeal Swab     Status: Abnormal   Collection Time: 08/29/19 10:59 AM   Specimen: Nasopharyngeal Swab  Result Value Ref Range Status   SARS Coronavirus 2 POSITIVE (  A) NEGATIVE Final    Comment: RESULT CALLED TO, READ BACK BY AND VERIFIED WITH: C.ROSE,RN 0448 08/30/19 G.MCADOO (NOTE) SARS-CoV-2 target nucleic acids are DETECTED. The SARS-CoV-2 RNA is generally detectable in upper and lower respiratory specimens during the acute phase of infection. Positive results are indicative of the presence of SARS-CoV-2 RNA. Clinical correlation with patient history and other diagnostic information is  necessary to determine patient infection status. Positive results do not rule out bacterial infection or co-infection with other viruses.  The expected result is Negative. Fact Sheet for Patients: SugarRoll.be Fact Sheet for Healthcare Providers: https://www.woods-mathews.com/ This test is not yet approved or cleared by the [place] FDA and  has been authorized for detection and/or diagnosis of SARS-CoV-2 by FDA under an Emergency Use Authorization (EUA). This EUA will remain  in effect  (meaning this test can be used) for the du ration of the COVID-19 declaration under Section 564(b)(1) of the Act, 21 U.S.C. section 360bbb-3(b)(1), unless the authorization is terminated or revoked sooner. Performed at [facility], [place] [address] [place], [place] [address]   Blood Culture (routine x 2)     Status: None (Preliminary result)   Collection Time: 08/29/19 11:55 AM   Specimen: BLOOD LEFT HAND  Result Value Ref Range Status   Specimen Description BLOOD LEFT HAND  Final   Special Requests   Final    BOTTLES DRAWN AEROBIC AND ANAEROBIC Blood Culture adequate volume   Culture   Final    NO GROWTH 3 DAYS Performed at [facility], [address], [place], [place] [address]    Report Status PENDING  Incomplete  Blood Culture (routine x 2)     Status: Abnormal   Collection Time: 08/29/19 11:55 AM   Specimen: BLOOD LEFT ARM  Result Value Ref Range Status   Specimen Description   Final    BLOOD LEFT ARM Performed at [facility], [address] [place], [place] [address]    Special Requests   Final    BOTTLES DRAWN AEROBIC AND ANAEROBIC Blood Culture adequate volume Performed at [facility], [address], [place], [place] [address]    Culture  Setup Time   Final    GRAM POSITIVE COCCI ANAEROBIC BOTTLE Gram Stain Report Called to,Read Back By and Verified With: BENGSTON,T@0810  BY MATTHEWS,B 12.24.2020 [facility] Performed at [facility], [address], [place], [place] [address]    Culture (A)  Final    STAPHYLOCOCCUS SPECIES (COAGULASE NEGATIVE) THE SIGNIFICANCE OF ISOLATING THIS ORGANISM FROM A SINGLE SET OF BLOOD CULTURES WHEN MULTIPLE SETS ARE DRAWN IS UNCERTAIN. PLEASE NOTIFY THE MICROBIOLOGY DEPARTMENT WITHIN ONE WEEK IF SPECIATION AND SENSITIVITIES ARE REQUIRED. Performed at [facility], [place] [address] [place], [place] [address]    Report Status 09/01/2019 FINAL  Final         Radiology Studies: No results found.      Scheduled  Meds: . albuterol  1-2 puff Inhalation Q6H  . amLODipine  10 mg Oral Daily  . aspirin  81 mg Oral BID  . dexamethasone (DECADRON) injection  6 mg Intravenous Q24H  . fenofibrate  160 mg Oral Daily  . heparin  5,000 Units Subcutaneous Q8H  . loratadine  10 mg Oral Daily  . mouth rinse  15 mL Mouth Rinse BID   Continuous Infusions: . lactated ringers 100 mL/hr at 09/01/19 0948     LOS: 3 days    Time spent: 30 minutes  Shanea Karney Darleen Crocker, DO [facility] Pager [phone]  If 7PM-7AM, please contact night-coverage [email or web address] Password [email or web address] 09/01/2019, 10:08 AM

## 2019-09-02 DIAGNOSIS — J1289 Other viral pneumonia: Secondary | ICD-10-CM

## 2019-09-02 DIAGNOSIS — J9601 Acute respiratory failure with hypoxia: Secondary | ICD-10-CM

## 2019-09-02 DIAGNOSIS — J9621 Acute and chronic respiratory failure with hypoxia: Secondary | ICD-10-CM

## 2019-09-02 DIAGNOSIS — N179 Acute kidney failure, unspecified: Secondary | ICD-10-CM

## 2019-09-02 DIAGNOSIS — U071 COVID-19: Principal | ICD-10-CM

## 2019-09-02 DIAGNOSIS — G894 Chronic pain syndrome: Secondary | ICD-10-CM

## 2019-09-02 DIAGNOSIS — I10 Essential (primary) hypertension: Secondary | ICD-10-CM

## 2019-09-02 LAB — COMPREHENSIVE METABOLIC PANEL
ALT: 65 U/L — ABNORMAL HIGH (ref 0–44)
AST: 51 U/L — ABNORMAL HIGH (ref 15–41)
Albumin: 3.1 g/dL — ABNORMAL LOW (ref 3.5–5.0)
Alkaline Phosphatase: 76 U/L (ref 38–126)
Anion gap: 13 (ref 5–15)
BUN: 25 mg/dL — ABNORMAL HIGH (ref 6–20)
CO2: 23 mmol/L (ref 22–32)
Calcium: 9.5 mg/dL (ref 8.9–10.3)
Chloride: 103 mmol/L (ref 98–111)
Creatinine, Ser: 1.47 mg/dL — ABNORMAL HIGH (ref 0.61–1.24)
GFR calc Af Amer: 60 mL/min — ABNORMAL LOW (ref >60)
GFR calc non Af Amer: 51 mL/min — ABNORMAL LOW (ref >60)
Glucose, Bld: 186 mg/dL — ABNORMAL HIGH (ref 70–99)
Potassium: 4.5 mmol/L (ref 3.5–5.1)
Sodium: 139 mmol/L (ref 135–145)
Total Bilirubin: 0.5 mg/dL (ref 0.3–1.2)
Total Protein: 7.8 g/dL (ref 6.5–8.1)

## 2019-09-02 LAB — CBC WITH DIFFERENTIAL/PLATELET
Abs Immature Granulocytes: 1.38 10*3/uL — ABNORMAL HIGH (ref 0.00–0.07)
Basophils Absolute: 0.1 10*3/uL (ref 0.0–0.1)
Basophils Relative: 0 %
Eosinophils Absolute: 0 10*3/uL (ref 0.0–0.5)
Eosinophils Relative: 0 %
HCT: 41.4 % (ref 39.0–52.0)
Hemoglobin: 12.8 g/dL — ABNORMAL LOW (ref 13.0–17.0)
Immature Granulocytes: 9 %
Lymphocytes Relative: 8 %
Lymphs Abs: 1.3 10*3/uL (ref 0.7–4.0)
MCH: 27.8 pg (ref 26.0–34.0)
MCHC: 30.9 g/dL (ref 30.0–36.0)
MCV: 90 fL (ref 80.0–100.0)
Monocytes Absolute: 0.8 10*3/uL (ref 0.1–1.0)
Monocytes Relative: 5 %
Neutro Abs: 12.5 10*3/uL — ABNORMAL HIGH (ref 1.7–7.7)
Neutrophils Relative %: 78 %
Platelets: 354 10*3/uL (ref 150–400)
RBC: 4.6 MIL/uL (ref 4.22–5.81)
RDW: 15.3 % (ref 11.5–15.5)
WBC: 16.1 10*3/uL — ABNORMAL HIGH (ref 4.0–10.5)
nRBC: 0.2 % (ref 0.0–0.2)

## 2019-09-02 LAB — C-REACTIVE PROTEIN: CRP: 1.2 mg/dL — ABNORMAL HIGH (ref <1.0)

## 2019-09-02 LAB — URINE CULTURE: Culture: NO GROWTH

## 2019-09-02 LAB — D-DIMER, QUANTITATIVE: D-Dimer, Quant: 0.51 ug/mL-FEU — ABNORMAL HIGH (ref 0.00–0.50)

## 2019-09-02 LAB — MAGNESIUM: Magnesium: 1.8 mg/dL (ref 1.7–2.4)

## 2019-09-02 MED ORDER — FENOFIBRATE 160 MG PO TABS: 160.0000 mg | ORAL_TABLET | Freq: Every day | ORAL | 1 refills | Status: DC

## 2019-09-02 MED ORDER — PREDNISONE 20 MG PO TABS: ORAL_TABLET | ORAL | 0 refills | Status: DC

## 2019-09-02 MED ORDER — LORATADINE 10 MG PO TABS: 10.0000 mg | ORAL_TABLET | Freq: Every day | ORAL | 1 refills | Status: AC

## 2019-09-02 MED ORDER — GUAIFENESIN-CODEINE 100-10 MG/5ML PO SOLN: 10.0000 mL | Freq: Four times a day (QID) | ORAL | 0 refills | Status: DC | PRN

## 2019-09-02 MED ORDER — ZINC SULFATE 220 (50 ZN) MG PO CAPS: 220.0000 mg | ORAL_CAPSULE | Freq: Every day | ORAL | 1 refills | Status: DC

## 2019-09-02 MED ORDER — ASCORBIC ACID 500 MG PO TABS: 500.0000 mg | ORAL_TABLET | Freq: Every day | ORAL | 1 refills | Status: DC

## 2019-09-02 MED ORDER — PANTOPRAZOLE SODIUM 40 MG PO TBEC: 40.0000 mg | DELAYED_RELEASE_TABLET | Freq: Every day | ORAL | 1 refills | Status: DC

## 2019-09-02 NOTE — Progress Notes (Signed)
IV x2 removed, tele removed. D/C instructions, including isolation precautions, reviewed with patient and wife (via telephone), verbalized understanding. Patient to be transported to private vehicle via wheelchair. Will continue to monitor.

## 2019-09-02 NOTE — Discharge Summary (Signed)
Physician Discharge Summary  Mathew Lara CVE:[id number] DOB: 17-Aug-1961 DOA: 08/29/2019  PCP: [name], MD  Admit date: 08/29/2019 Discharge date: 09/02/2019  Time spent: 35 minutes  Recommendations for Outpatient Follow-up:  1. Repeat chest x-ray to assure complete resolution of infiltrates. 2. -Repeat basic metabolic panel to follow across renal function 3. Aggressive blood pressure and adjust antihypertensive regimen.   Discharge Diagnoses:  Active Problems:   Essential hypertension   Acute renal failure (HCC)   Pneumonia due to COVID-19 virus   Acute respiratory failure with hypoxia (HCC)   Chronic pain syndrome   Discharge Condition: Stable and improved.  Patient instructed to follow-up with PCP in 10 days.  Diet recommendation: Heart healthy diet.  Filed Weights   08/29/19 1046 08/29/19 2117  Weight: 126.1 kg 120.9 kg    History of present illness:  Per HPI: Mathew Lara a 58 y.o.malewith medical history significant forCOPD,hepatitis C, alcohol abuse, hypertension,and stage III CKD who presented to the ED with complaints of some nausea as well as headache. He denies any fevers or chills and states that he is having some shortness of breath as well. He denies any abdominal pain, coughing, or diarrhea. He had seen his primary care doctor who had given him some nausea medication but this has not helped him. He has come into contact with sick people at work with coronavirus as well.  Hospital Course:  1-acute hypoxemic respiratory failure due to COVID-19 -Patient has completed 5 days of remdesivir -Improved and no requiring oxygen supplementation at discharge. -Patient will continue using as needed albuterol, antitussives and the steroids tapering. -Continue following the 3.please (wearing mask, washing hands and waiting 6 feet apart). -Outpatient follow-up with PCP in 10 days. -Continue the use of as needed antiemetics, maintain adequate hydration  and using vitamin C and zinc.  2-acute kidney injury and chronic kidney disease a stage III -Stable and with creatinine essentially back to baseline at discharge -Advised to maintain adequate hydration -Continue minimizing the use of nephrotoxic agents -Repeat basic metabolic panel follow-up visit to reassess electrolytes and renal function.  3-dyslipidemia -Continue home medications include healthy diet.  4-history of COPD -Currently no wheezing -Completed steroids for COVID-19 infection and continue the use of as needed albuterol.  5-gram-positive cocci in 1 set of blood culture -Coagulase-negative speciation appreciated -Appears to be a contaminant -No IV antibiotics required   6-GERD -continue PPI   7-essential hypertension -Overall stable and well-controlled -Continue home antihypertensive regimen. Follow heart healthy diet.  Procedures:  See below for x-ray reports  Consultations:  None  Discharge Exam: Vitals:   09/02/19 0559 09/02/19 1440  BP: (!) 145/97 (!) 142/96  Pulse: 91 (!) 104  Resp: 16 20  Temp: (!) 97.5 F (36.4 C) 97.9 F (36.6 C)  SpO2: 100% 95%    General: Afebrile, no chest pain, no nausea, no vomiting.  Patient with good oxygen saturation on room air and feeling ready to go home. Cardiovascular: RRR, no rubs, no gallops, no JVD. Respiratory: Improved air movement bilaterally, no wheezing, no using accessory muscles; normal respiratory effort. Abdomen: Soft, nontender, nondistended, positive bowel sounds Extremities: No cyanosis, no clubbing.  Discharge Instructions   Discharge Instructions    Diet - low sodium heart healthy   Complete by: As directed    Discharge instructions   Complete by: As directed    Take medications as prescribed Maintain adequate hydration Arrange follow-up with PCP in 10 days Continue to follow the 3 tablets (waiting 6 feet apart,  washing hands frequently and wearing mask).     Allergies as of  09/02/2019   No Known Allergies     Medication List    TAKE these medications   amLODipine 10 MG tablet Commonly known as: NORVASC Take 10 mg by mouth daily.   ascorbic acid 500 MG tablet Commonly known as: VITAMIN C Take 1 tablet (500 mg total) by mouth daily. Start taking on: September 03, 2019   aspirin 81 MG chewable tablet Chew 1 tablet (81 mg total) by mouth 2 (two) times daily. What changed: when to take this   fenofibrate 160 MG tablet Take 1 tablet (160 mg total) by mouth daily.   guaiFENesin-codeine 100-10 MG/5ML syrup Take 10 mLs by mouth every 6 (six) hours as needed for cough.   loratadine 10 MG tablet Commonly known as: CLARITIN Take 1 tablet (10 mg total) by mouth daily.   ondansetron 4 MG tablet Commonly known as: ZOFRAN Take 4-8 mg by mouth every 8 (eight) hours as needed for nausea or vomiting.   oxyCODONE 5 MG immediate release tablet Commonly known as: Oxy IR/ROXICODONE Take 1-2 tablets (5-10 mg total) by mouth every 6 (six) hours as needed for moderate pain (pain score 4-6).   pantoprazole 40 MG tablet Commonly known as: Protonix Take 1 tablet (40 mg total) by mouth daily.   predniSONE 20 MG tablet Commonly known as: Deltasone Take 3 tablets by mouth daily x2 days; then 2 tablets by mouth daily x2-day; then 1 tablet by mouth daily x3 days; then half tablet by mouth daily x3 days and stop prednisone.   ProAir HFA 108 (90 Base) MCG/ACT inhaler Generic drug: albuterol Inhale 2 puffs into the lungs every 6 (six) hours as needed for wheezing or shortness of breath.   zinc sulfate 220 (50 Zn) MG capsule Take 1 capsule (220 mg total) by mouth daily. Start taking on: September 03, 2019      No Known Allergies Follow-up Information    [name], MD. Schedule an appointment as soon as possible for a visit in 10 day(s).   Specialty: Family Medicine Contact information: [address] [place] [address] [phone]            The results of significant diagnostics from this hospitalization (including imaging, microbiology, ancillary and laboratory) are listed below for reference.    Significant Diagnostic Studies: DG Chest Port 1 View  Result Date: 08/29/2019 CLINICAL DATA:  58 year old male with cough and hypoxia. COVID-19 status pending. EXAM: PORTABLE CHEST 1 VIEW COMPARISON:  Chest radiographs 04/18/2018 and earlier. FINDINGS: Mild chronic peripheral and slightly asymmetric bilateral lung opacity seen in 2019. Stable lung volumes and mediastinal contours. Negative trachea. Evidence of upper lobe emphysema, a especially on the right, where now there is superimposed more confluent pulmonary ground-glass opacity. Increased patchy and indistinct left upper and mid lung opacity also superimposed on the chronic density. No pneumothorax or pleural effusion. Paucity of bowel gas. No acute osseous abnormality identified. IMPRESSION: Chronic lung disease with evidence of Emphysema (ICD10-J43.9). Superimposed bilateral new patchy and indistinct pulmonary opacity compatible with acute viral/atypical respiratory infection and suspicious for COVID-19 pneumonia. No pleural effusion. a Electronically Signed   By: Genevie Ann M.D.   On: 08/29/2019 11:31    Microbiology: Recent Results (from the past 240 hour(s))  SARS CORONAVIRUS 2 (TAT 6-24 HRS) Nasopharyngeal Nasopharyngeal Swab     Status: Abnormal   Collection Time: 08/29/19 10:59 AM   Specimen: Nasopharyngeal Swab  Result Value Ref  Range Status   SARS Coronavirus 2 POSITIVE (A) NEGATIVE Final    Comment: RESULT CALLED TO, READ BACK BY AND VERIFIED WITH: C.ROSE,RN 0448 08/30/19 G.MCADOO (NOTE) SARS-CoV-2 target nucleic acids are DETECTED. The SARS-CoV-2 RNA is generally detectable in upper and lower respiratory specimens during the acute phase of infection. Positive results are indicative of the presence of SARS-CoV-2 RNA. Clinical correlation with patient history and  other diagnostic information is  necessary to determine patient infection status. Positive results do not rule out bacterial infection or co-infection with other viruses.  The expected result is Negative. Fact Sheet for Patients: SugarRoll.be Fact Sheet for Healthcare Providers: https://www.woods-mathews.com/ This test is not yet approved or cleared by the [place] FDA and  has been authorized for detection and/or diagnosis of SARS-CoV-2 by FDA under an Emergency Use Authorization (EUA). This EUA will remain  in effect (meaning this test can be used) for the du ration of the COVID-19 declaration under Section 564(b)(1) of the Act, 21 U.S.C. section 360bbb-3(b)(1), unless the authorization is terminated or revoked sooner. Performed at [facility], [place] [address] [place], [place] [address]   Blood Culture (routine x 2)     Status: None (Preliminary result)   Collection Time: 08/29/19 11:55 AM   Specimen: BLOOD LEFT HAND  Result Value Ref Range Status   Specimen Description BLOOD LEFT HAND  Final   Special Requests   Final    BOTTLES DRAWN AEROBIC AND ANAEROBIC Blood Culture adequate volume   Culture   Final    NO GROWTH 4 DAYS Performed at [facility], [address] [place] [address] [place], [place] [address]    Report Status PENDING  Incomplete  Blood Culture (routine x 2)     Status: Abnormal   Collection Time: 08/29/19 11:55 AM   Specimen: BLOOD LEFT ARM  Result Value Ref Range Status   Specimen Description   Final    BLOOD LEFT ARM Performed at [facility], [address], [place], [place] [address]    Special Requests   Final    BOTTLES DRAWN AEROBIC AND ANAEROBIC Blood Culture adequate volume Performed at [facility], [address], [place], [place] [address]    Culture  Setup Time   Final    GRAM POSITIVE COCCI ANAEROBIC BOTTLE Gram Stain Report Called to,Read Back By and Verified With: [email or web address]  BY MATTHEWS,B  12.24.2020 [facility] Performed at [facility], [address], [place], [place] [address]    Culture (A)  Final    STAPHYLOCOCCUS SPECIES (COAGULASE NEGATIVE) THE SIGNIFICANCE OF ISOLATING THIS ORGANISM FROM A SINGLE SET OF BLOOD CULTURES WHEN MULTIPLE SETS ARE DRAWN IS UNCERTAIN. PLEASE NOTIFY THE MICROBIOLOGY DEPARTMENT WITHIN ONE WEEK IF SPECIATION AND SENSITIVITIES ARE REQUIRED. Performed at [facility], [place] [address] [place], [place] [address]    Report Status 09/01/2019 FINAL  Final  Culture, Urine     Status: None   Collection Time: 09/01/19  2:50 AM   Specimen: Urine, Clean Catch  Result Value Ref Range Status   Specimen Description   Final    URINE, CLEAN CATCH Performed at [facility], [address] [place], [place] [address]    Special Requests   Final    NONE Performed at [facility], [address], [place], [place] [address]    Culture   Final    NO GROWTH Performed at [facility], [place] [address], [place], [place] [address]    Report Status 09/02/2019 FINAL  Final     Labs: Basic Metabolic Panel: Recent Labs  Lab 08/29/19 1155 08/30/19 0614 08/31/19 0754 09/01/19 0749 09/02/19 0757  NA 137 138 139 139 139  K 4.0 5.1 4.5 4.6 4.5  CL 102 104 105 106 103  CO2 21* 23 25 24 23   GLUCOSE 99 121* 146* 207* 186*  BUN 34* 35* 28* 29* 25*  CREATININE 3.23* 2.52* 1.84* 1.95* 1.47*  CALCIUM 9.0 9.2 9.1 8.7* 9.5  MG  --  2.4 2.0 1.8 1.8   Liver Function Tests: Recent Labs  Lab 08/29/19 1155 08/30/19 0614 08/31/19 0754 09/01/19 0749 09/02/19 0757  AST 55* 36 29 41 51*  ALT 71* 55* 45* 52* 65*  ALKPHOS 92 85 79 75 76  BILITOT 0.9 0.4 0.3 0.4 0.5  PROT 8.6* 8.1 7.7 7.2 7.8  ALBUMIN 3.5 3.1* 2.9* 2.8* 3.1*   CBC: Recent Labs  Lab 08/29/19 1155 08/30/19 0614 08/31/19 0754 09/01/19 0749 09/02/19 0757  WBC 7.5 6.6 9.8 12.0* 16.1*  NEUTROABS 6.0 5.7 8.0* 9.9* 12.5*  HGB 12.5* 12.4* 12.1* 11.6* 12.8*  HCT 40.4 40.3 40.3 38.4*  41.4  MCV 89.6 89.0 90.2 90.4 90.0  PLT 261 269 315 325 354   Signed:  Barton Dubois MD.  [facility] 09/02/2019, 3:12 PM

## 2019-09-03 LAB — CULTURE, BLOOD (ROUTINE X 2)
Culture: NO GROWTH
Special Requests: ADEQUATE

## 2019-09-18 ENCOUNTER — Observation Stay (HOSPITAL_COMMUNITY)
Admission: EM | Admit: 2019-09-18 | Discharge: 2019-09-20 | Disposition: A | Discharge status: Home / Self Care | Payer: BC Managed Care – PPO | Source: Ambulatory Visit | Attending: Family Medicine | Admitting: Family Medicine

## 2019-09-18 ENCOUNTER — Emergency Department (HOSPITAL_COMMUNITY): Payer: BC Managed Care – PPO

## 2019-09-18 ENCOUNTER — Other Ambulatory Visit: Payer: Self-pay

## 2019-09-18 ENCOUNTER — Encounter (HOSPITAL_COMMUNITY): Payer: Self-pay | Admitting: Emergency Medicine

## 2019-09-18 DIAGNOSIS — J9601 Acute respiratory failure with hypoxia: Secondary | ICD-10-CM | POA: Diagnosis not present

## 2019-09-18 DIAGNOSIS — K219 Gastro-esophageal reflux disease without esophagitis: Secondary | ICD-10-CM | POA: Diagnosis not present

## 2019-09-18 DIAGNOSIS — N179 Acute kidney failure, unspecified: Secondary | ICD-10-CM | POA: Diagnosis not present

## 2019-09-18 DIAGNOSIS — Z6841 Body Mass Index (BMI) 40.0 and over, adult: Secondary | ICD-10-CM

## 2019-09-18 DIAGNOSIS — I10 Essential (primary) hypertension: Secondary | ICD-10-CM | POA: Diagnosis not present

## 2019-09-18 DIAGNOSIS — E860 Dehydration: Secondary | ICD-10-CM | POA: Diagnosis not present

## 2019-09-18 DIAGNOSIS — E669 Obesity, unspecified: Secondary | ICD-10-CM | POA: Diagnosis present

## 2019-09-18 DIAGNOSIS — J449 Chronic obstructive pulmonary disease, unspecified: Secondary | ICD-10-CM | POA: Diagnosis present

## 2019-09-18 DIAGNOSIS — M1990 Unspecified osteoarthritis, unspecified site: Secondary | ICD-10-CM | POA: Insufficient documentation

## 2019-09-18 DIAGNOSIS — G8929 Other chronic pain: Secondary | ICD-10-CM | POA: Diagnosis not present

## 2019-09-18 DIAGNOSIS — Z79899 Other long term (current) drug therapy: Secondary | ICD-10-CM | POA: Insufficient documentation

## 2019-09-18 DIAGNOSIS — R7989 Other specified abnormal findings of blood chemistry: Secondary | ICD-10-CM

## 2019-09-18 DIAGNOSIS — B948 Sequelae of other specified infectious and parasitic diseases: Secondary | ICD-10-CM

## 2019-09-18 DIAGNOSIS — Z1389 Encounter for screening for other disorder: Secondary | ICD-10-CM | POA: Diagnosis not present

## 2019-09-18 DIAGNOSIS — R918 Other nonspecific abnormal finding of lung field: Secondary | ICD-10-CM | POA: Diagnosis not present

## 2019-09-18 DIAGNOSIS — E782 Mixed hyperlipidemia: Secondary | ICD-10-CM | POA: Diagnosis present

## 2019-09-18 DIAGNOSIS — N1832 Chronic kidney disease, stage 3b: Secondary | ICD-10-CM | POA: Diagnosis not present

## 2019-09-18 DIAGNOSIS — R630 Anorexia: Secondary | ICD-10-CM | POA: Diagnosis not present

## 2019-09-18 DIAGNOSIS — R0902 Hypoxemia: Secondary | ICD-10-CM | POA: Diagnosis not present

## 2019-09-18 DIAGNOSIS — Z87891 Personal history of nicotine dependence: Secondary | ICD-10-CM | POA: Insufficient documentation

## 2019-09-18 DIAGNOSIS — E785 Hyperlipidemia, unspecified: Secondary | ICD-10-CM | POA: Insufficient documentation

## 2019-09-18 DIAGNOSIS — Z8616 Personal history of COVID-19: Secondary | ICD-10-CM | POA: Insufficient documentation

## 2019-09-18 DIAGNOSIS — Z7982 Long term (current) use of aspirin: Secondary | ICD-10-CM | POA: Diagnosis not present

## 2019-09-18 DIAGNOSIS — I129 Hypertensive chronic kidney disease with stage 1 through stage 4 chronic kidney disease, or unspecified chronic kidney disease: Secondary | ICD-10-CM | POA: Insufficient documentation

## 2019-09-18 DIAGNOSIS — R791 Abnormal coagulation profile: Secondary | ICD-10-CM | POA: Insufficient documentation

## 2019-09-18 DIAGNOSIS — R0602 Shortness of breath: Secondary | ICD-10-CM | POA: Diagnosis not present

## 2019-09-18 DIAGNOSIS — Z96643 Presence of artificial hip joint, bilateral: Secondary | ICD-10-CM | POA: Diagnosis not present

## 2019-09-18 DIAGNOSIS — I2699 Other pulmonary embolism without acute cor pulmonale: Secondary | ICD-10-CM | POA: Diagnosis not present

## 2019-09-18 DIAGNOSIS — Z6838 Body mass index (BMI) 38.0-38.9, adult: Secondary | ICD-10-CM | POA: Diagnosis not present

## 2019-09-18 DIAGNOSIS — U071 COVID-19: Secondary | ICD-10-CM | POA: Diagnosis not present

## 2019-09-18 LAB — CREATININE, URINE, RANDOM: Creatinine, Urine: 458.5 mg/dL

## 2019-09-18 LAB — CBC WITH DIFFERENTIAL/PLATELET
Abs Immature Granulocytes: 0.08 K/uL — ABNORMAL HIGH (ref 0.00–0.07)
Basophils Absolute: 0 K/uL (ref 0.0–0.1)
Basophils Relative: 0 %
Eosinophils Absolute: 0.1 K/uL (ref 0.0–0.5)
Eosinophils Relative: 1 %
HCT: 37.6 % — ABNORMAL LOW (ref 39.0–52.0)
Hemoglobin: 11.7 g/dL — ABNORMAL LOW (ref 13.0–17.0)
Immature Granulocytes: 1 %
Lymphocytes Relative: 8 %
Lymphs Abs: 0.9 K/uL (ref 0.7–4.0)
MCH: 27.7 pg (ref 26.0–34.0)
MCHC: 31.1 g/dL (ref 30.0–36.0)
MCV: 88.9 fL (ref 80.0–100.0)
Monocytes Absolute: 0.9 K/uL (ref 0.1–1.0)
Monocytes Relative: 8 %
Neutro Abs: 9.5 K/uL — ABNORMAL HIGH (ref 1.7–7.7)
Neutrophils Relative %: 82 %
Platelets: 215 K/uL (ref 150–400)
RBC: 4.23 MIL/uL (ref 4.22–5.81)
RDW: 16.2 % — ABNORMAL HIGH (ref 11.5–15.5)
WBC: 11.5 K/uL — ABNORMAL HIGH (ref 4.0–10.5)
nRBC: 0 % (ref 0.0–0.2)

## 2019-09-18 LAB — D-DIMER, QUANTITATIVE: D-Dimer, Quant: 3.76 ug/mL-FEU — ABNORMAL HIGH (ref 0.00–0.50)

## 2019-09-18 LAB — COMPREHENSIVE METABOLIC PANEL WITH GFR
ALT: 17 U/L (ref 0–44)
AST: 18 U/L (ref 15–41)
Albumin: 2.7 g/dL — ABNORMAL LOW (ref 3.5–5.0)
Alkaline Phosphatase: 54 U/L (ref 38–126)
Anion gap: 13 (ref 5–15)
BUN: 22 mg/dL — ABNORMAL HIGH (ref 6–20)
CO2: 20 mmol/L — ABNORMAL LOW (ref 22–32)
Calcium: 8.9 mg/dL (ref 8.9–10.3)
Chloride: 102 mmol/L (ref 98–111)
Creatinine, Ser: 2.66 mg/dL — ABNORMAL HIGH (ref 0.61–1.24)
GFR calc Af Amer: 29 mL/min — ABNORMAL LOW
GFR calc non Af Amer: 25 mL/min — ABNORMAL LOW
Glucose, Bld: 133 mg/dL — ABNORMAL HIGH (ref 70–99)
Potassium: 3.4 mmol/L — ABNORMAL LOW (ref 3.5–5.1)
Sodium: 135 mmol/L (ref 135–145)
Total Bilirubin: 0.9 mg/dL (ref 0.3–1.2)
Total Protein: 8.7 g/dL — ABNORMAL HIGH (ref 6.5–8.1)

## 2019-09-18 LAB — URINALYSIS, ROUTINE W REFLEX MICROSCOPIC
Bilirubin Urine: NEGATIVE
Glucose, UA: NEGATIVE mg/dL
Hgb urine dipstick: NEGATIVE
Ketones, ur: NEGATIVE mg/dL
Leukocytes,Ua: NEGATIVE
Nitrite: NEGATIVE
Protein, ur: >=300 mg/dL — AB
Specific Gravity, Urine: 1.023 (ref 1.005–1.030)
pH: 5 (ref 5.0–8.0)

## 2019-09-18 LAB — SODIUM, URINE, RANDOM: Sodium, Ur: 47 mmol/L

## 2019-09-18 LAB — LACTIC ACID, PLASMA: Lactic Acid, Venous: 1 mmol/L (ref 0.5–1.9)

## 2019-09-18 MED ORDER — OXYCODONE HCL 5 MG PO TABS
5.0000 mg | ORAL_TABLET | Freq: Four times a day (QID) | ORAL | Status: DC | PRN
  Administered 2019-09-19: 22:00:00 10 mg via ORAL
  Filled 2019-09-18: qty 2

## 2019-09-18 MED ORDER — PANTOPRAZOLE SODIUM 40 MG PO TBEC
40.0000 mg | DELAYED_RELEASE_TABLET | Freq: Every day | ORAL | Status: DC
  Administered 2019-09-19 – 2019-09-20 (×2): 40 mg via ORAL
  Filled 2019-09-18 (×2): qty 1

## 2019-09-18 MED ORDER — ZINC SULFATE 220 (50 ZN) MG PO CAPS
220.0000 mg | ORAL_CAPSULE | Freq: Every day | ORAL | Status: DC
  Administered 2019-09-19 – 2019-09-20 (×2): 220 mg via ORAL
  Filled 2019-09-18 (×2): qty 1

## 2019-09-18 MED ORDER — SENNOSIDES-DOCUSATE SODIUM 8.6-50 MG PO TABS
1.0000 | ORAL_TABLET | Freq: Two times a day (BID) | ORAL | Status: DC
  Administered 2019-09-18 – 2019-09-20 (×3): 1 via ORAL
  Filled 2019-09-18 (×9): qty 1

## 2019-09-18 MED ORDER — SODIUM CHLORIDE 0.9 % IV BOLUS
500.0000 mL | Freq: Once | INTRAVENOUS | Status: AC
  Administered 2019-09-18: 18:00:00 500 mL via INTRAVENOUS

## 2019-09-18 MED ORDER — ONDANSETRON HCL 4 MG PO TABS: 4.0000 mg | ORAL_TABLET | Freq: Four times a day (QID) | ORAL | Status: DC | PRN

## 2019-09-18 MED ORDER — ACETAMINOPHEN 325 MG PO TABS
650.0000 mg | ORAL_TABLET | Freq: Four times a day (QID) | ORAL | Status: DC | PRN
  Administered 2019-09-18 – 2019-09-20 (×3): 650 mg via ORAL
  Filled 2019-09-18 (×3): qty 2

## 2019-09-18 MED ORDER — HEPARIN (PORCINE) 25000 UT/250ML-% IV SOLN
2000.0000 [IU]/h | INTRAVENOUS | Status: DC
  Administered 2019-09-18: 20:00:00 1550 [IU]/h via INTRAVENOUS
  Filled 2019-09-18 (×2): qty 250

## 2019-09-18 MED ORDER — SODIUM CHLORIDE 0.9% FLUSH
3.0000 mL | Freq: Two times a day (BID) | INTRAVENOUS | Status: DC
  Administered 2019-09-20: 11:00:00 3 mL via INTRAVENOUS

## 2019-09-18 MED ORDER — HEPARIN BOLUS VIA INFUSION
4000.0000 [IU] | Freq: Once | INTRAVENOUS | Status: AC
  Administered 2019-09-18: 20:00:00 4000 [IU] via INTRAVENOUS

## 2019-09-18 MED ORDER — GUAIFENESIN-DM 100-10 MG/5ML PO SYRP: 10.0000 mL | ORAL_SOLUTION | ORAL | Status: DC | PRN

## 2019-09-18 MED ORDER — ONDANSETRON HCL 4 MG/2ML IJ SOLN: 4.0000 mg | Freq: Four times a day (QID) | INTRAMUSCULAR | Status: DC | PRN

## 2019-09-18 MED ORDER — IPRATROPIUM-ALBUTEROL 20-100 MCG/ACT IN AERS
1.0000 | INHALATION_SPRAY | Freq: Four times a day (QID) | RESPIRATORY_TRACT | Status: DC
  Administered 2019-09-19 – 2019-09-20 (×6): 1 via RESPIRATORY_TRACT

## 2019-09-18 MED ORDER — ALBUTEROL SULFATE HFA 108 (90 BASE) MCG/ACT IN AERS: 2.0000 | INHALATION_SPRAY | Freq: Four times a day (QID) | RESPIRATORY_TRACT | Status: DC | PRN

## 2019-09-18 MED ORDER — FENOFIBRATE 160 MG PO TABS
160.0000 mg | ORAL_TABLET | Freq: Every day | ORAL | Status: DC
  Administered 2019-09-19 – 2019-09-20 (×2): 160 mg via ORAL
  Filled 2019-09-18 (×5): qty 1

## 2019-09-18 MED ORDER — SODIUM CHLORIDE 0.9 % IV SOLN: INTRAVENOUS | Status: AC

## 2019-09-18 MED ORDER — AMLODIPINE BESYLATE 5 MG PO TABS
10.0000 mg | ORAL_TABLET | Freq: Every day | ORAL | Status: DC
  Administered 2019-09-20: 10 mg via ORAL
  Filled 2019-09-18 (×2): qty 2

## 2019-09-18 MED ORDER — HYDROCOD POLST-CPM POLST ER 10-8 MG/5ML PO SUER: 5.0000 mL | Freq: Two times a day (BID) | ORAL | Status: DC | PRN

## 2019-09-18 MED ORDER — ASCORBIC ACID 500 MG PO TABS
500.0000 mg | ORAL_TABLET | Freq: Every day | ORAL | Status: DC
  Administered 2019-09-19 – 2019-09-20 (×2): 500 mg via ORAL
  Filled 2019-09-18 (×2): qty 1

## 2019-09-18 NOTE — H&P (Signed)
History and Physical    Mathew Lara HBZ:[id number] DOB: [birth year] DOA: 09/18/2019  PCP: [name], MD  Patient coming from: PCP office  I have personally briefly reviewed patient's old medical records in [place]  Chief Complaint: Acute kidney injury  HPI: Mathew Lara is a 59 y.o. male with medical history significant for COPD, CKD stage III, hypertension, dyslipidemia, GERD, treated hepatitis C, and recent admission for COVID-19 pneumonia who presents to the ED for evaluation of acute on chronic kidney injury.  Patient had recent admission from 08/29/2019-09/02/2019 for acute hypoxic respiratory failure due to COVID-19 pneumonia.  He completed treatment with IV remdesivir and steroids and was discharged on a prednisone taper.  Patient states he was doing well on discharge but not completely recovered.  He reports having a persistent nonproductive cough.  Over the last few days he has been having shortness of breath, fatigue, and poor appetite.  He has had chills and body aches.  He reports good urine output but has been seeing dark-colored urine.  He denies any nausea, vomiting, or diarrhea.  He says his last bowel movement was 2 days ago.  He has not had any swelling in his feet or legs.  He denies any NSAID use.  He had a follow-up appointment with his primary care and was noted to have worsening renal function on labs and low oxygen saturation.  He was advised to come to the ED for further evaluation.  ED Course:  Initial vitals showed BP 129/90, pulse 111, RR 22, temp 99.1 Fahrenheit, SPO2 88% on room air.  Labs are notable for BUN 22, creatinine 2.66 (1.47 on 09/02/2019), sodium 135, potassium 3.4, bicarb 20, serum glucose 133, WBC 11.5, hemoglobin 11.7, platelets 215,000, lactic acid 1.0, D-dimer 3.76.  Portable chest x-ray showed improving but persistent airspace opacities throughout both lungs with stable right apical lucency.  Patient was given 500 cc normal  saline.  Due to concern for PE in setting of D-dimer and recent COVID-19 infection patient was started on IV heparin per pharmacy.  The hospitalist service was consulted to admit for further evaluation and management.  Review of Systems: All systems reviewed and are negative except as documented in history of present illness above.   Past Medical History:  Diagnosis Date  . Alcohol abuse   . Anxiety   . Bronchitis   . Chronic hip pain   . Chronic kidney disease (CKD) stage G3b/A3, moderately decreased glomerular filtration rate (GFR) between 30-44 mL/min/1.73 square meter and albuminuria creatinine ratio greater than 300 mg/g 01/03/2018  . COPD (chronic obstructive pulmonary disease) ([place])   . Hepatitis C   . Hypertension   . Mixed hyperlipidemia   . Renal disorder    states renal dysfunction, "strained kidney"  . Sleep apnea     Past Surgical History:  Procedure Laterality Date  . COLONOSCOPY N/A 01/23/2014   SLF:The LEFT COLON IS EXTREMELY redundant/TWO RECTAL POLYPS REMOVED/SMALL VOLUME RECTAL BLEEDING MOST LIKELY DUE TO Small internal hemorrhoids  . ESOPHAGOGASTRODUODENOSCOPY N/A 01/23/2014   BOF:BPZWCHEN ring at the gastroesophageal junction/Medium sized hiatal hernia/NDYSPEPSIA MOST LIKELY DUE TO GERD/MILD Non-erosive gastritis  . ESOPHAGOGASTRODUODENOSCOPY N/A 04/18/2018   Procedure: ESOPHAGOGASTRODUODENOSCOPY (EGD);  Surgeon: Daneil Dolin, MD;  [facility];  Service: Endoscopy;  Laterality: N/A;  . HEMORRHOID BANDING  01/23/2014   Procedure: HEMORRHOID BANDING;  Surgeon: Danie Binder, MD;  Location: [facility];  Service: Endoscopy;;  . JOINT REPLACEMENT Right 2017  . MOUTH SURGERY    .  TOTAL HIP ARTHROPLASTY Right 06/04/2016   Procedure: RIGHT TOTAL HIP ARTHROPLASTY ANTERIOR APPROACH;  Surgeon: Mcarthur Rossetti, MD;  Location: [facility];  Service: Orthopedics;  Laterality: Right;  . TOTAL HIP ARTHROPLASTY Left 03/02/2019   Procedure: LEFT TOTAL HIP  ARTHROPLASTY ANTERIOR APPROACH;  Surgeon: Mcarthur Rossetti, MD;  Location: [facility];  Service: Orthopedics;  Laterality: Left;    Social History:  reports that he quit smoking about 5 years ago. His smoking use included cigarettes. He has never used smokeless tobacco. He reports previous alcohol use. He reports that he does not use drugs.  No Known Allergies  Family History  Problem Relation Age of Onset  . Cancer Mother      Prior to Admission medications   Medication Sig Start Date End Date Taking? Authorizing Provider  amLODipine (NORVASC) 10 MG tablet Take 10 mg by mouth daily. 02/05/19   [provider]  ascorbic acid (VITAMIN C) 500 MG tablet Take 1 tablet (500 mg total) by mouth daily. 09/03/19   Barton Dubois, MD  aspirin 81 MG chewable tablet Chew 1 tablet (81 mg total) by mouth 2 (two) times daily. Patient taking differently: Chew 81 mg by mouth daily.  03/04/19   Mcarthur Rossetti, MD  fenofibrate 160 MG tablet Take 1 tablet (160 mg total) by mouth daily. 09/02/19   Barton Dubois, MD  guaiFENesin-codeine 100-10 MG/5ML syrup Take 10 mLs by mouth every 6 (six) hours as needed for cough. 09/02/19   Barton Dubois, MD  loratadine (CLARITIN) 10 MG tablet Take 1 tablet (10 mg total) by mouth daily. 09/02/19   Barton Dubois, MD  ondansetron (ZOFRAN) 4 MG tablet Take 4-8 mg by mouth every 8 (eight) hours as needed for nausea or vomiting.    [provider]  oxyCODONE (OXY IR/ROXICODONE) 5 MG immediate release tablet Take 1-2 tablets (5-10 mg total) by mouth every 6 (six) hours as needed for moderate pain (pain score 4-6). 03/19/19   Pete Pelt, PA-C  pantoprazole (PROTONIX) 40 MG tablet Take 1 tablet (40 mg total) by mouth daily. 09/02/19 09/01/20  Barton Dubois, MD  predniSONE (DELTASONE) 20 MG tablet Take 3 tablets by mouth daily x2 days; then 2 tablets by mouth daily x2-day; then 1 tablet by mouth daily x3 days; then half tablet by mouth daily x3  days and stop prednisone. 09/02/19   Barton Dubois, MD  PROAIR HFA 108 [phone] Base) MCG/ACT inhaler Inhale 2 puffs into the lungs every 6 (six) hours as needed for wheezing or shortness of breath.  01/04/19   [provider]  zinc sulfate 220 (50 Zn) MG capsule Take 1 capsule (220 mg total) by mouth daily. 09/03/19   Barton Dubois, MD    Physical Exam: Vitals:   09/18/19 1702 09/18/19 1708 09/18/19 1709 09/18/19 2016  BP:  129/90  118/90  Pulse:  (!) 119  95  Resp:  (!) 22  14  Temp: 99.1 F (37.3 C)     TempSrc: Oral     SpO2:  (!) 88%  97%  Weight:   120.9 kg   Height:   5\' 8"  (1.727 m)    Constitutional: Obese man resting supine in bed, NAD, calm, comfortable Eyes: PERRL, lids and conjunctivae normal ENMT: Mucous membranes are moist. Posterior pharynx clear of any exudate or lesions.Normal dentition.  Neck: normal, supple, no masses. Respiratory: clear to auscultation bilaterally, no wheezing, no crackles. Normal respiratory effort. No accessory muscle use.  Cardiovascular: Regular rate and  rhythm, no murmurs / rubs / gallops. No extremity edema. 2+ pedal pulses. Abdomen: no tenderness, no masses palpated. No hepatosplenomegaly. Bowel sounds positive.  Musculoskeletal: no clubbing / cyanosis. No joint deformity upper and lower extremities. Good ROM, no contractures. Normal muscle tone.  Skin: no rashes, lesions, ulcers. No induration Neurologic: CN 2-12 grossly intact. Sensation intact, Strength 5/5 in all 4.  Psychiatric: Normal judgment and insight. Alert and oriented x 3. Normal mood.     Labs on Admission: I have personally reviewed following labs and imaging studies  CBC: Recent Labs  Lab 09/18/19 1724  WBC 11.5*  NEUTROABS 9.5*  HGB 11.7*  HCT 37.6*  MCV 88.9  PLT 938   Basic Metabolic Panel: Recent Labs  Lab 09/18/19 1724  NA 135  K 3.4*  CL 102  CO2 20*  GLUCOSE 133*  BUN 22*  CREATININE 2.66*  CALCIUM 8.9   GFR: Estimated Creatinine  Clearance: 37.8 mL/min (A) (by C-G formula based on SCr of 2.66 mg/dL (H)). Liver Function Tests: Recent Labs  Lab 09/18/19 1724  AST 18  ALT 17  ALKPHOS 54  BILITOT 0.9  PROT 8.7*  ALBUMIN 2.7*   No results for input(s): LIPASE, AMYLASE in the last 168 hours. No results for input(s): AMMONIA in the last 168 hours. Coagulation Profile: No results for input(s): INR, PROTIME in the last 168 hours. Cardiac Enzymes: No results for input(s): CKTOTAL, CKMB, CKMBINDEX, TROPONINI in the last 168 hours. BNP (last 3 results) No results for input(s): PROBNP in the last 8760 hours. HbA1C: No results for input(s): HGBA1C in the last 72 hours. CBG: No results for input(s): GLUCAP in the last 168 hours. Lipid Profile: No results for input(s): CHOL, HDL, LDLCALC, TRIG, CHOLHDL, LDLDIRECT in the last 72 hours. Thyroid Function Tests: No results for input(s): TSH, T4TOTAL, FREET4, T3FREE, THYROIDAB in the last 72 hours. Anemia Panel: No results for input(s): VITAMINB12, FOLATE, FERRITIN, TIBC, IRON, RETICCTPCT in the last 72 hours. Urine analysis:    Component Value Date/Time   COLORURINE YELLOW 09/01/2019 0250   APPEARANCEUR CLEAR 09/01/2019 0250   LABSPEC 1.018 09/01/2019 0250   PHURINE 5.0 09/01/2019 0250   GLUCOSEU 50 (A) 09/01/2019 0250   HGBUR NEGATIVE 09/01/2019 0250   BILIRUBINUR NEGATIVE 09/01/2019 0250   KETONESUR NEGATIVE 09/01/2019 0250   PROTEINUR 100 (A) 09/01/2019 0250   UROBILINOGEN 0.2 07/06/2013 1024   NITRITE NEGATIVE 09/01/2019 0250   LEUKOCYTESUR NEGATIVE 09/01/2019 0250    Radiological Exams on Admission: DG Chest Port 1 View  Result Date: 09/18/2019 CLINICAL DATA:  First follow-up after being diagnosed with COVID-19 near Christmas. EXAM: PORTABLE CHEST 1 VIEW COMPARISON:  Radiograph 08/29/2019 FINDINGS: Improved though persistent opacities present throughout both lungs. More mixed bandlike areas of scarring and or architectural distortion are present. A right  apical lucency is similar to comparison studies and may reflect some bullous disease. Cardiomediastinal contours are likely accentuated by low volumes and portable technique. No acute osseous or soft tissue abnormality. Degenerative changes are present in the imaged spine and shoulders. IMPRESSION: 1. Improved though persistent opacities throughout both lungs. 2. Stable right apical lucency. This may reflect some bullous disease with additional areas of more chronic scarring. Electronically Signed   By: Lovena Le M.D.   On: 09/18/2019 17:45    EKG: Independently reviewed. Sinus tachycardia without acute ischemic changes.  Rate is faster when compared to prior.  Assessment/Plan Principal Problem:   Acute renal failure superimposed on stage 3b chronic kidney disease (  [place]) Active Problems:   Essential hypertension   COPD (chronic obstructive pulmonary disease) (HCC)  Mathew Lara is a 59 y.o. male with medical history significant for COPD, CKD stage III, hypertension, dyslipidemia, GERD, treated hepatitis C, and recent admission for COVID-19 pneumonia who is admitted with acute on chronic kidney injury.  AKI on CKD stage III: Suspect prerenal from dehydration with poor oral intake reported.  Given 500 cc normal saline in the ED. -Continue IV fluid hydration overnight -Check urinalysis, urine sodium and creatinine -Obtain renal ultrasound -Repeat labs in a.m.  Elevated D-dimer: D-dimer 3.76 on admission.  There was concern for PE given recent COVID-19 infection and tachycardia on arrival.  He has been started on IV heparin.  Cannot obtain CTA chest PE study due to renal dysfunction.  He is considered moderate risk for PE. -Continue IV heparin for now -Obtain lower extremity Dopplers, check VQ scan  Recent COVID-19 pneumonia: Admitted 08/29/2019-09/02/2019 and completed treatment with remdesivir and steroids.  Chest x-ray with persistent but improving bilateral infiltrates.  O2 saturation  88% on room air, improved on 2 L supplemental O2 via [place]. -Continue supplemental oxygen as needed, wean off as able -Continue Combivent, flutter valve, incentive spirometer -Continue vitamin C, zinc -Will continue to monitor daily labs  COPD: Does not appear to have acute exacerbation.  Continue Combivent, as needed albuterol, supplemental oxygen.  Hypertension: Continue amlodipine.  Dyslipidemia: Continue fenofibrate.  Osteoarthritis/chronic pain: Continue home OxyIR as needed.  Add bowel regimen.  DVT prophylaxis: Heparin gtt Code Status: Full code, confirmed with patient Family Communication: Discussed with patient's daughter on his phone at bedside Disposition Plan: Likely discharge to home pending clinical progress Consults called: None Admission status: Observation   Zada Finders MD [facility]  If 7PM-7AM, please contact night-coverage [email or web address]  09/18/2019, 8:34 PM

## 2019-09-18 NOTE — ED Triage Notes (Addendum)
Pt reports had first follow-up after bieng diagnosed with covid on 12/23. Pt reports oxygen level and kidney function was low and pt was told to come to ED for further evaluation.   Pt reports burning sensation in epigastric region and non-productive cough.  Pt room air saturation 88%. Pt placed on 2 liters [place], 90% O2 Saturation.

## 2019-09-18 NOTE — ED Provider Notes (Signed)
[facility] EMERGENCY DEPARTMENT Provider Note   CSN: [id number] Arrival date & time: 09/18/19  1700     History Chief Complaint  Patient presents with   Shortness of Breath    Mathew Lara is a 59 y.o. male with a history of COPD, hepatitis C, hypertension and renal disorder who was recently hospitalized for COVID-19 pneumonia  having been discharged on December 27.  He reports continued generalized fatigue along with poor p.o. intake although has been able to maintain fluid intake.  He describes increasing shortness of breath since yesterday, has had increased thirst and increased urinary frequency.  Was seen by his PCP today and had blood work drawn which revealed an elevated creatinine.  Additionally he reports increased shortness of breath with some mild wheezing which worsened yesterday evening.  He endorses a nonproductive cough.  He has had no peripheral edema.  Denies chest pain or pleuritic pain.  He received nebulizer treatment by his PCP prior to arrival here and his breathing is mildly improved.  He denies fevers or chills.  He denies abdominal pain, but does report feeling bloated in his abdomen.  No nausea, vomiting, diarrhea.  He has been afebrile at home.    The history is provided by the patient.       Past Medical History:  Diagnosis Date   Alcohol abuse    Anxiety    Bronchitis    Chronic hip pain    Chronic kidney disease (CKD) stage G3b/A3, moderately decreased glomerular filtration rate (GFR) between 30-44 mL/min/1.73 square meter and albuminuria creatinine ratio greater than 300 mg/g 01/03/2018   COPD (chronic obstructive pulmonary disease) (HCC)    Hepatitis C    Hypertension    Mixed hyperlipidemia    Renal disorder    states renal dysfunction, "strained kidney"   Sleep apnea     Patient Active Problem List   Diagnosis Date Noted   Acute respiratory failure with hypoxia ([place])    Chronic pain syndrome    Pneumonia due to COVID-19  virus 08/29/2019   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   Acute renal failure ([place]) 03/26/2018   Rash 03/26/2018   Chest pain 03/08/2018   CKD (chronic kidney disease), stage III 03/08/2018   COPD (chronic obstructive pulmonary disease) ([place]) 03/08/2018   Avascular necrosis of bone of left hip ([place]) 07/27/2017   Avascular necrosis of bone of right hip ([place]) 06/04/2016   Status post total replacement of right hip 06/04/2016   Hepatitis C 03/21/2015   History of alcohol abuse 11/08/2013   Tobacco abuse 11/08/2013   Essential hypertension 11/07/2013   Abnormal ECG 11/07/2013    Past Surgical History:  Procedure Laterality Date   COLONOSCOPY N/A 01/23/2014   SLF:The LEFT COLON IS EXTREMELY redundant/TWO RECTAL POLYPS REMOVED/SMALL VOLUME RECTAL BLEEDING MOST LIKELY DUE TO Small internal hemorrhoids   ESOPHAGOGASTRODUODENOSCOPY N/A 01/23/2014   QPR:FFMBWGYK ring at the gastroesophageal junction/Medium sized hiatal hernia/NDYSPEPSIA MOST LIKELY DUE TO GERD/MILD Non-erosive gastritis   ESOPHAGOGASTRODUODENOSCOPY N/A 04/18/2018   Procedure: ESOPHAGOGASTRODUODENOSCOPY (EGD);  Surgeon: Daneil Dolin, MD;  [facility];  Service: Endoscopy;  Laterality: N/A;   HEMORRHOID BANDING  01/23/2014   Procedure: HEMORRHOID BANDING;  Surgeon: Danie Binder, MD;  [facility];  Service: Endoscopy;;   JOINT REPLACEMENT Right 2017   MOUTH SURGERY     TOTAL HIP ARTHROPLASTY Right 06/04/2016   Procedure: RIGHT TOTAL HIP ARTHROPLASTY ANTERIOR APPROACH;  Surgeon: Mcarthur Rossetti, MD;  Location: [facility];  Service: Orthopedics;  Laterality: Right;   TOTAL HIP ARTHROPLASTY Left 03/02/2019   Procedure: LEFT TOTAL HIP ARTHROPLASTY ANTERIOR APPROACH;  Surgeon: Mcarthur Rossetti, MD;  Location: [facility];  Service: Orthopedics;  Laterality: Left;       Family History  Problem Relation Age of Onset   Cancer Mother     Social History   Tobacco  Use   Smoking status: Former Smoker    Types: Cigarettes    Quit date: 05/28/2014    Years since quitting: 5.3   Smokeless tobacco: Never Used   Tobacco comment: quit in November 2015 after smoking x 20 yrs.  Substance Use Topics   Alcohol use: Not Currently    Alcohol/week: 0.0 standard drinks    Comment: rare   Drug use: No    Home Medications Prior to Admission medications   Medication Sig Start Date End Date Taking? Authorizing Provider  amLODipine (NORVASC) 10 MG tablet Take 10 mg by mouth daily. 02/05/19   [provider]  ascorbic acid (VITAMIN C) 500 MG tablet Take 1 tablet (500 mg total) by mouth daily. 09/03/19   Barton Dubois, MD  aspirin 81 MG chewable tablet Chew 1 tablet (81 mg total) by mouth 2 (two) times daily. Patient taking differently: Chew 81 mg by mouth daily.  03/04/19   Mcarthur Rossetti, MD  fenofibrate 160 MG tablet Take 1 tablet (160 mg total) by mouth daily. 09/02/19   Barton Dubois, MD  guaiFENesin-codeine 100-10 MG/5ML syrup Take 10 mLs by mouth every 6 (six) hours as needed for cough. 09/02/19   Barton Dubois, MD  loratadine (CLARITIN) 10 MG tablet Take 1 tablet (10 mg total) by mouth daily. 09/02/19   Barton Dubois, MD  ondansetron (ZOFRAN) 4 MG tablet Take 4-8 mg by mouth every 8 (eight) hours as needed for nausea or vomiting.    [provider]  oxyCODONE (OXY IR/ROXICODONE) 5 MG immediate release tablet Take 1-2 tablets (5-10 mg total) by mouth every 6 (six) hours as needed for moderate pain (pain score 4-6). 03/19/19   Pete Pelt, PA-C  pantoprazole (PROTONIX) 40 MG tablet Take 1 tablet (40 mg total) by mouth daily. 09/02/19 09/01/20  Barton Dubois, MD  predniSONE (DELTASONE) 20 MG tablet Take 3 tablets by mouth daily x2 days; then 2 tablets by mouth daily x2-day; then 1 tablet by mouth daily x3 days; then half tablet by mouth daily x3 days and stop prednisone. 09/02/19   Barton Dubois, MD  PROAIR HFA 108 [phone] Base)  MCG/ACT inhaler Inhale 2 puffs into the lungs every 6 (six) hours as needed for wheezing or shortness of breath.  01/04/19   [provider]  zinc sulfate 220 (50 Zn) MG capsule Take 1 capsule (220 mg total) by mouth daily. 09/03/19   Barton Dubois, MD    Allergies    Patient has no known allergies.  Review of Systems   Review of Systems  Constitutional: Positive for fatigue. Negative for chills and fever.  HENT: Negative for congestion and sore throat.   Eyes: Negative.   Respiratory: Positive for cough and shortness of breath. Negative for chest tightness.   Cardiovascular: Negative for chest pain.  Gastrointestinal: Negative for abdominal pain, nausea and vomiting.  Endocrine: Positive for polydipsia and polyuria.  Genitourinary: Negative.   Musculoskeletal: Negative for arthralgias, joint swelling and neck pain.  Skin: Negative.  Negative for rash and wound.  Neurological: Negative for dizziness, weakness, light-headedness, numbness and  headaches.  Psychiatric/Behavioral: Negative.     Physical Exam Updated Vital Signs BP 129/90 (BP Location: Right Arm)    Pulse (!) 119    Temp 99.1 F (37.3 C) (Oral)    Resp (!) 22    Ht 5\' 8"  (1.727 m)    Wt 120.9 kg    SpO2 (!) 88%    BMI 40.53 kg/m   Physical Exam Vitals and nursing note reviewed.  Constitutional:      Appearance: He is well-developed.  HENT:     Head: Normocephalic and atraumatic.  Eyes:     Conjunctiva/sclera: Conjunctivae normal.  Cardiovascular:     Rate and Rhythm: Normal rate and regular rhythm.     Heart sounds: Normal heart sounds.  Pulmonary:     Effort: Pulmonary effort is normal.     Breath sounds: Decreased breath sounds present. No wheezing, rhonchi or rales.  Abdominal:     General: Bowel sounds are normal.     Palpations: Abdomen is soft.     Tenderness: There is no abdominal tenderness.  Musculoskeletal:        General: Normal range of motion.     Cervical back: Normal range of motion.      Right lower leg: No edema.     Left lower leg: No edema.  Skin:    General: Skin is warm and dry.  Neurological:     Mental Status: He is alert.     ED Results / Procedures / Treatments   Labs (all labs ordered are listed, but only abnormal results are displayed) Labs Reviewed  COMPREHENSIVE METABOLIC PANEL - Abnormal; Notable for the following components:      Result Value   Potassium 3.4 (*)    CO2 20 (*)    Glucose, Bld 133 (*)    BUN 22 (*)    Creatinine, Ser 2.66 (*)    Total Protein 8.7 (*)    Albumin 2.7 (*)    GFR calc non Af Amer 25 (*)    GFR calc Af Amer 29 (*)    All other components within normal limits  CBC WITH DIFFERENTIAL/PLATELET - Abnormal; Notable for the following components:   WBC 11.5 (*)    Hemoglobin 11.7 (*)    HCT 37.6 (*)    RDW 16.2 (*)    Neutro Abs 9.5 (*)    Abs Immature Granulocytes 0.08 (*)    All other components within normal limits  D-DIMER, QUANTITATIVE (NOT AT [facility]) - Abnormal; Notable for the following components:   D-Dimer, Quant 3.76 (*)    All other components within normal limits  LACTIC ACID, PLASMA  URINALYSIS, ROUTINE W REFLEX MICROSCOPIC    EKG None  Radiology DG Chest Port 1 View  Result Date: 09/18/2019 CLINICAL DATA:  First follow-up after being diagnosed with COVID-19 near Christmas. EXAM: PORTABLE CHEST 1 VIEW COMPARISON:  Radiograph 08/29/2019 FINDINGS: Improved though persistent opacities present throughout both lungs. More mixed bandlike areas of scarring and or architectural distortion are present. A right apical lucency is similar to comparison studies and may reflect some bullous disease. Cardiomediastinal contours are likely accentuated by low volumes and portable technique. No acute osseous or soft tissue abnormality. Degenerative changes are present in the imaged spine and shoulders. IMPRESSION: 1. Improved though persistent opacities throughout both lungs. 2. Stable right apical lucency. This may reflect  some bullous disease with additional areas of more chronic scarring. Electronically Signed   By: Elwin Sleight.D.  On: 09/18/2019 17:45    Procedures Procedures (including critical care time)  Medications Ordered in ED Medications  sodium chloride 0.9 % bolus 500 mL (500 mLs Intravenous New Bag/Given 09/18/19 1801)    ED Course  I have reviewed the triage vital signs and the nursing notes.  Pertinent labs & imaging results that were available during my care of the patient were reviewed by me and considered in my medical decision making (see chart for details).    MDM Rules/Calculators/A&P                      Patient was initially hypoxic and tachycardic.  He did receive an albuterol neb treatment just prior to arrival which could explain the tachycardia.  He has no peripheral edema, denies pleuritic chest pain.  Initial oxygen saturation was 88% which bumped up to 92% on 2 L nasal cannula.  7:23 PM Pt with pulse ox 94-97% on 2L [place], Mild tachypnea.  D dimer significantly elevated in comparison to labs from recent admission concerning for possible PE.  In the setting of worsening renal insufficiency pt will need admission.  Prob will need VQ scan in place of CT angio.  Heparin ordered after discussion with Dr. Laverta Baltimore.   Discussed with Dr. Posey Pronto who accepts pt for admission. Final Clinical Impression(s) / ED Diagnoses Final diagnoses:  SOB (shortness of breath)  AKI (acute kidney injury) [facility])  Hypoxia    Rx / DC Orders ED Discharge Orders    None       Landis Martins 09/18/19 2239    Margette Fast, MD 09/19/19 1201

## 2019-09-18 NOTE — Progress Notes (Signed)
ANTICOAGULATION CONSULT NOTE - Initial Consult  Pharmacy Consult for heparin gtt  Indication: pulmonary embolism  No Known Allergies  Patient Measurements: Height: 5\' 8"  (172.7 cm) Weight: 266 lb 8.6 oz (120.9 kg) IBW/kg (Calculated) : 68.4 Heparin Dosing Weight: HEPARIN DW (KG): 96.1   Vital Signs: Temp: 99.1 F (37.3 C) (01/12 1702) Temp Source: Oral (01/12 1702) BP: 129/90 (01/12 1708) Pulse Rate: 119 (01/12 1708)  Labs: Recent Labs    09/18/19 1724  HGB 11.7*  HCT 37.6*  PLT 215  CREATININE 2.66*    Estimated Creatinine Clearance: 37.8 mL/min (A) (by C-G formula based on SCr of 2.66 mg/dL (H)).   Medical History: Past Medical History:  Diagnosis Date  . Alcohol abuse   . Anxiety   . Bronchitis   . Chronic hip pain   . Chronic kidney disease (CKD) stage G3b/A3, moderately decreased glomerular filtration rate (GFR) between 30-44 mL/min/1.73 square meter and albuminuria creatinine ratio greater than 300 mg/g 01/03/2018  . COPD (chronic obstructive pulmonary disease) ([place])   . Hepatitis C   . Hypertension   . Mixed hyperlipidemia   . Renal disorder    states renal dysfunction, "strained kidney"  . Sleep apnea     Medications:  (Not in a hospital admission)  Scheduled:  . heparin  4,000 Units Intravenous Once   Infusions:  . heparin     PRN:  Anti-infectives (From admission, onward)   None      Assessment: Mathew Lara a 59 y.o. male requires anticoagulation with a heparin iv infusion for the indication of  Pulmonary embolism. Heparin gtt will be started following pharmacy protocol per pharmacy consult. Patient is not on previous oral anticoagulant that will require aPTT/HL correlation before transitioning to only HL monitoring.   Goal of Therapy:  Heparin level 0.3-0.7 units/ml Monitor platelets by anticoagulation protocol: Yes   Plan:  Give 4000 units bolus x 1 Start heparin infusion at 1550 units/hr Check anti-Xa level in 6 hours and  daily while on heparin Continue to monitor H&H and platelets  Heparin level to be drawn in 6 hours.   Donna Christen Mordche Hedglin 09/18/2019,7:46 PM

## 2019-09-19 ENCOUNTER — Observation Stay (HOSPITAL_COMMUNITY): Payer: BC Managed Care – PPO

## 2019-09-19 ENCOUNTER — Encounter (HOSPITAL_COMMUNITY): Payer: Self-pay | Admitting: Internal Medicine

## 2019-09-19 DIAGNOSIS — Z96643 Presence of artificial hip joint, bilateral: Secondary | ICD-10-CM | POA: Diagnosis present

## 2019-09-19 DIAGNOSIS — R7989 Other specified abnormal findings of blood chemistry: Secondary | ICD-10-CM | POA: Diagnosis present

## 2019-09-19 DIAGNOSIS — Z7982 Long term (current) use of aspirin: Secondary | ICD-10-CM | POA: Diagnosis not present

## 2019-09-19 DIAGNOSIS — J449 Chronic obstructive pulmonary disease, unspecified: Secondary | ICD-10-CM

## 2019-09-19 DIAGNOSIS — K219 Gastro-esophageal reflux disease without esophagitis: Secondary | ICD-10-CM | POA: Diagnosis present

## 2019-09-19 DIAGNOSIS — E860 Dehydration: Secondary | ICD-10-CM | POA: Diagnosis present

## 2019-09-19 DIAGNOSIS — I10 Essential (primary) hypertension: Secondary | ICD-10-CM | POA: Diagnosis not present

## 2019-09-19 DIAGNOSIS — N179 Acute kidney failure, unspecified: Secondary | ICD-10-CM | POA: Diagnosis present

## 2019-09-19 DIAGNOSIS — E669 Obesity, unspecified: Secondary | ICD-10-CM | POA: Diagnosis present

## 2019-09-19 DIAGNOSIS — I129 Hypertensive chronic kidney disease with stage 1 through stage 4 chronic kidney disease, or unspecified chronic kidney disease: Secondary | ICD-10-CM | POA: Diagnosis present

## 2019-09-19 DIAGNOSIS — Z87891 Personal history of nicotine dependence: Secondary | ICD-10-CM | POA: Diagnosis not present

## 2019-09-19 DIAGNOSIS — E782 Mixed hyperlipidemia: Secondary | ICD-10-CM | POA: Diagnosis present

## 2019-09-19 DIAGNOSIS — M1990 Unspecified osteoarthritis, unspecified site: Secondary | ICD-10-CM | POA: Diagnosis present

## 2019-09-19 DIAGNOSIS — R0602 Shortness of breath: Secondary | ICD-10-CM | POA: Diagnosis present

## 2019-09-19 DIAGNOSIS — I2699 Other pulmonary embolism without acute cor pulmonale: Secondary | ICD-10-CM | POA: Diagnosis not present

## 2019-09-19 DIAGNOSIS — J9601 Acute respiratory failure with hypoxia: Secondary | ICD-10-CM | POA: Diagnosis present

## 2019-09-19 DIAGNOSIS — N1832 Chronic kidney disease, stage 3b: Secondary | ICD-10-CM | POA: Diagnosis not present

## 2019-09-19 DIAGNOSIS — Z6841 Body Mass Index (BMI) 40.0 and over, adult: Secondary | ICD-10-CM | POA: Diagnosis not present

## 2019-09-19 DIAGNOSIS — G8929 Other chronic pain: Secondary | ICD-10-CM | POA: Diagnosis present

## 2019-09-19 DIAGNOSIS — R630 Anorexia: Secondary | ICD-10-CM | POA: Diagnosis present

## 2019-09-19 DIAGNOSIS — B948 Sequelae of other specified infectious and parasitic diseases: Secondary | ICD-10-CM | POA: Diagnosis not present

## 2019-09-19 LAB — COMPREHENSIVE METABOLIC PANEL
ALT: 17 U/L (ref 0–44)
AST: 17 U/L (ref 15–41)
Albumin: 2.4 g/dL — ABNORMAL LOW (ref 3.5–5.0)
Alkaline Phosphatase: 50 U/L (ref 38–126)
Anion gap: 8 (ref 5–15)
BUN: 23 mg/dL — ABNORMAL HIGH (ref 6–20)
CO2: 22 mmol/L (ref 22–32)
Calcium: 8.6 mg/dL — ABNORMAL LOW (ref 8.9–10.3)
Chloride: 107 mmol/L (ref 98–111)
Creatinine, Ser: 2.36 mg/dL — ABNORMAL HIGH (ref 0.61–1.24)
GFR calc Af Amer: 34 mL/min — ABNORMAL LOW (ref >60)
GFR calc non Af Amer: 29 mL/min — ABNORMAL LOW (ref >60)
Glucose, Bld: 138 mg/dL — ABNORMAL HIGH (ref 70–99)
Potassium: 3.3 mmol/L — ABNORMAL LOW (ref 3.5–5.1)
Sodium: 137 mmol/L (ref 135–145)
Total Bilirubin: 0.7 mg/dL (ref 0.3–1.2)
Total Protein: 7.6 g/dL (ref 6.5–8.1)

## 2019-09-19 LAB — HEPARIN LEVEL (UNFRACTIONATED)
Heparin Unfractionated: 0.19 IU/mL — ABNORMAL LOW (ref 0.30–0.70)
Heparin Unfractionated: 0.34 IU/mL (ref 0.30–0.70)
Heparin Unfractionated: 0.13 IU/mL — ABNORMAL LOW (ref 0.30–0.70)

## 2019-09-19 LAB — CBC
HCT: 34.1 % — ABNORMAL LOW (ref 39.0–52.0)
Hemoglobin: 10.4 g/dL — ABNORMAL LOW (ref 13.0–17.0)
MCH: 27.9 pg (ref 26.0–34.0)
MCHC: 30.5 g/dL (ref 30.0–36.0)
MCV: 91.4 fL (ref 80.0–100.0)
Platelets: 175 10*3/uL (ref 150–400)
RBC: 3.73 MIL/uL — ABNORMAL LOW (ref 4.22–5.81)
RDW: 16.3 % — ABNORMAL HIGH (ref 11.5–15.5)
WBC: 8.2 10*3/uL (ref 4.0–10.5)
nRBC: 0 % (ref 0.0–0.2)

## 2019-09-19 LAB — C-REACTIVE PROTEIN: CRP: 23.9 mg/dL — ABNORMAL HIGH (ref <1.0)

## 2019-09-19 LAB — FERRITIN: Ferritin: 181 ng/mL (ref 24–336)

## 2019-09-19 LAB — D-DIMER, QUANTITATIVE: D-Dimer, Quant: 4.14 ug/mL-FEU — ABNORMAL HIGH (ref 0.00–0.50)

## 2019-09-19 LAB — ABO/RH: ABO/RH(D): O POS

## 2019-09-19 LAB — PHOSPHORUS: Phosphorus: 3.5 mg/dL (ref 2.5–4.6)

## 2019-09-19 LAB — MAGNESIUM: Magnesium: 1.8 mg/dL (ref 1.7–2.4)

## 2019-09-19 MED ORDER — HEPARIN BOLUS VIA INFUSION
3000.0000 [IU] | Freq: Once | INTRAVENOUS | Status: AC
  Administered 2019-09-19: 03:00:00 3000 [IU] via INTRAVENOUS

## 2019-09-19 MED ORDER — HEPARIN BOLUS VIA INFUSION
3000.0000 [IU] | Freq: Once | INTRAVENOUS | Status: AC
  Administered 2019-09-19: 17:00:00 3000 [IU] via INTRAVENOUS

## 2019-09-19 MED ORDER — ENOXAPARIN SODIUM 60 MG/0.6ML ~~LOC~~ SOLN
60.0000 mg | SUBCUTANEOUS | Status: DC
  Administered 2019-09-20: 60 mg via SUBCUTANEOUS
  Filled 2019-09-19: qty 0.6

## 2019-09-19 MED ORDER — TECHNETIUM TO 99M ALBUMIN AGGREGATED
1.5000 | Freq: Once | INTRAVENOUS | Status: AC
  Administered 2019-09-19: 13:00:00 1.6 via INTRAVENOUS

## 2019-09-19 MED ORDER — ALPRAZOLAM 0.5 MG PO TABS
0.5000 mg | ORAL_TABLET | Freq: Three times a day (TID) | ORAL | Status: DC | PRN
  Administered 2019-09-19 – 2019-09-20 (×2): 0.5 mg via ORAL
  Filled 2019-09-19 (×2): qty 1

## 2019-09-19 NOTE — Progress Notes (Signed)
[name] for heparin Indication: pulmonary embolism (r/o)  No Known Allergies  Patient Measurements: Height: 5\' 8"  (172.7 cm) Weight: 266 lb 8.6 oz (120.9 kg) IBW/kg (Calculated) : 68.4 HEPARIN DW (KG): 96.1   Vital Signs: BP: 111/79 (01/13 0500) Pulse Rate: 104 (01/13 1000)  Labs: Recent Labs    09/18/19 1724 09/19/19 0219 09/19/19 0924  HGB 11.7* 10.4*  --   HCT 37.6* 34.1*  --   PLT 215 175  --   HEPARINUNFRC  --  0.19* 0.34  CREATININE 2.66* 2.36*  --     Estimated Creatinine Clearance: 42.6 mL/min (A) (by C-G formula based on SCr of 2.36 mg/dL (H)).   Assessment: Mathew Lara a 59 y.o. male requires anticoagulation with a heparin iv infusion for the indication of r/o Pulmonary embolism. Heparin gtt will be started following pharmacy protocol per pharmacy consult. Patient is not on previous oral anticoagulant that will require aPTT/HL correlation before transitioning to only HL monitoring. Venous U/S with No evidence of deep venous thrombosis in either lower extremity. recent admission for COVID-19 pneumonia 08/29/2019-09/02/2019. Patient has elevated D-Dimer. Cannot obtain CTA chest PE study due to renal dysfunction.   Heparin level therapeutic 0.34 on gtt at 1850 units/hr. No issues with line or bleeding reported per RN.  Goal of Therapy:  Heparin level 0.3-0.7 units/ml Monitor platelets by anticoagulation protocol: Yes   Plan:  Continue heparin gtt at 1850 units/hr F/u 6hr heparin level and daily Monitor for S/S of bleeding F/U plan  [name], BS [place], BCPS Clinical Pharmacist Pager [phone] 09/19/2019,11:10 AM

## 2019-09-19 NOTE — Progress Notes (Signed)
ANTICOAGULATION CONSULT NOTE   Pharmacy Consult for heparin Indication: pulmonary embolism (r/o)  No Known Allergies  Patient Measurements: Height: 5\' 8"  (172.7 cm) Weight: 266 lb 8.6 oz (120.9 kg) IBW/kg (Calculated) : 68.4 HEPARIN DW (KG): 96.1   Vital Signs: BP: 121/78 (01/13 1600) Pulse Rate: 107 (01/13 1600)  Labs: Recent Labs    09/18/19 1724 09/19/19 0219 09/19/19 0924 09/19/19 1611  HGB 11.7* 10.4*  --   --   HCT 37.6* 34.1*  --   --   PLT 215 175  --   --   HEPARINUNFRC  --  0.19* 0.34 0.13*  CREATININE 2.66* 2.36*  --   --     Estimated Creatinine Clearance: 42.6 mL/min (A) (by C-G formula based on SCr of 2.36 mg/dL (H)).   Assessment: Mathew Lara a 59 y.o. male requires anticoagulation with a heparin iv infusion for the indication of r/o Pulmonary embolism. Heparin gtt will be started following pharmacy protocol per pharmacy consult. Patient is not on previous oral anticoagulant that will require aPTT/HL correlation before transitioning to only HL monitoring. Venous U/S with No evidence of deep venous thrombosis in either lower extremity. recent admission for COVID-19 pneumonia 08/29/2019-09/02/2019. Patient has elevated D-Dimer. Cannot obtain CTA chest PE study due to renal dysfunction.   Heparin level subtherapeutic 0.13 on gtt at 1850 units/hr. Called RN, patient was off heparin ~60 mins for MRI and VQ Scan. Restarted at 1400 which gives some enlightenment into subtherapeutic leve.  Goal of Therapy:  Heparin level 0.3-0.7 units/ml Monitor platelets by anticoagulation protocol: Yes   Plan:  Bolus heparin 3000 units increase heparin gtt at 2000 units/hr F/u 6hr heparin level and daily Monitor for S/S of bleeding F/U plan and VQ scan results  [name], BS [place], BCPS Clinical Pharmacist Pager [phone] 09/19/2019,4:48 PM

## 2019-09-19 NOTE — ED Notes (Signed)
ED TO INPATIENT HANDOFF REPORT  ED Nurse Name and Phone #: [phone]  S Name/Age/Gender Mathew Lara 59 y.o. male Room/Bed: APA19/APA19  Code Status   Code Status: Full Code  Home/SNF/Other Home Patient oriented to: situation Is this baseline? Yes   Triage Complete: Triage complete  Chief Complaint Acute renal failure superimposed on stage 3b chronic kidney disease ([place]) [N17.9, N18.32] AKI (acute kidney injury) ([place]) [N17.9]  Triage Note Pt reports had first follow-up after bieng diagnosed with covid on 12/23. Pt reports oxygen level and kidney function was low and pt was told to come to ED for further evaluation.   Pt reports burning sensation in epigastric region and non-productive cough.  Pt room air saturation 88%. Pt placed on 2 liters [place], 90% O2 Saturation.    Allergies No Known Allergies  Level of Care/Admitting Diagnosis ED Disposition    ED Disposition Condition [facility] Area: [facility] [[id number]]  Level of Care: Telemetry [5]  Covid Evaluation: Confirmed COVID Positive  Diagnosis: AKI (acute kidney injury) [facility]) [[id number]]  Admitting Physician: Morrison Old  Attending Physician: Morrison Old  Estimated length of stay: 3 - 4 days  Certification:: I certify this patient will need inpatient services for at least 2 midnights  Bed request comments: tele       B Medical/Surgery History Past Medical History:  Diagnosis Date  . Alcohol abuse   . Anxiety   . Bronchitis   . Chronic hip pain   . Chronic kidney disease (CKD) stage G3b/A3, moderately decreased glomerular filtration rate (GFR) between 30-44 mL/min/1.73 square meter and albuminuria creatinine ratio greater than 300 mg/g 01/03/2018  . COPD (chronic obstructive pulmonary disease) ([place])   . Hepatitis C   . Hypertension   . Mixed hyperlipidemia   . Renal disorder    states renal dysfunction, "strained kidney"  . Sleep apnea    Past  Surgical History:  Procedure Laterality Date  . COLONOSCOPY N/A 01/23/2014   SLF:The LEFT COLON IS EXTREMELY redundant/TWO RECTAL POLYPS REMOVED/SMALL VOLUME RECTAL BLEEDING MOST LIKELY DUE TO Small internal hemorrhoids  . ESOPHAGOGASTRODUODENOSCOPY N/A 01/23/2014   DGU:[id number] ring at the gastroesophageal junction/Medium sized hiatal hernia/NDYSPEPSIA MOST LIKELY DUE TO GERD/MILD Non-erosive gastritis  . ESOPHAGOGASTRODUODENOSCOPY N/A 04/18/2018   Procedure: ESOPHAGOGASTRODUODENOSCOPY (EGD);  Surgeon: Daneil Dolin, MD;  Location[facility];  Service: Endoscopy;  Laterality: N/A;  . HEMORRHOID BANDING  01/23/2014   Procedure: HEMORRHOID BANDING;  Surgeon: Danie Binder, MD;  Location: [facility];  Service: Endoscopy;;  . JOINT REPLACEMENT Right 2017  . MOUTH SURGERY    . TOTAL HIP ARTHROPLASTY Right 06/04/2016   Procedure: RIGHT TOTAL HIP ARTHROPLASTY ANTERIOR APPROACH;  Surgeon: Mcarthur Rossetti, MD;  Location: [facility] ORS;  Service: Orthopedics;  Laterality: Right;  . TOTAL HIP ARTHROPLASTY Left 03/02/2019   Procedure: LEFT TOTAL HIP ARTHROPLASTY ANTERIOR APPROACH;  Surgeon: Mcarthur Rossetti, MD;  Location: [facility]S;  Service: Orthopedics;  Laterality: Left;     A IV Location/Drains/Wounds Patient Lines/Drains/Airways Status   Active Line/Drains/Airways    Name:   Placement date:   Placement time:   Site:   Days:   Peripheral IV 09/18/19 Right Antecubital   09/18/19    1731    Antecubital   1   Incision (Closed) 03/02/19 Hip Left   03/02/19    0923     201          Intake/Output Last 24 hours  Intake/Output Summary (Last 24 hours) at 09/19/2019 2027 Last data filed at 09/19/2019 0422 Gross per 24 hour  Intake --  Output 600 ml  Net -600 ml    Labs/Imaging Results for orders placed or performed during the hospital encounter of 09/18/19 (from the past 48 hour(s))  Lactic acid, plasma     Status: None   Collection Time: 09/18/19  5:24 PM  Result Value Ref Range    Lactic Acid, Venous 1.0 0.5 - 1.9 mmol/L    Comment: Performed at [facility], [address] [place], [place] [address]  Comprehensive metabolic panel     Status: Abnormal   Collection Time: 09/18/19  5:24 PM  Result Value Ref Range   Sodium 135 135 - 145 mmol/L   Potassium 3.4 (L) 3.5 - 5.1 mmol/L   Chloride 102 98 - 111 mmol/L   CO2 20 (L) 22 - 32 mmol/L   Glucose, Bld 133 (H) 70 - 99 mg/dL   BUN 22 (H) 6 - 20 mg/dL   Creatinine, Ser 2.66 (H) 0.61 - 1.24 mg/dL   Calcium 8.9 8.9 - 10.3 mg/dL   Total Protein 8.7 (H) 6.5 - 8.1 g/dL   Albumin 2.7 (L) 3.5 - 5.0 g/dL   AST 18 15 - 41 U/L   ALT 17 0 - 44 U/L   Alkaline Phosphatase 54 38 - 126 U/L   Total Bilirubin 0.9 0.3 - 1.2 mg/dL   GFR calc non Af Amer 25 (L) >60 mL/min   GFR calc Af Amer 29 (L) >60 mL/min   Anion gap 13 5 - 15    Comment: Performed at [facility], [address] [place], [place] [address]  CBC with Differential     Status: Abnormal   Collection Time: 09/18/19  5:24 PM  Result Value Ref Range   WBC 11.5 (H) 4.0 - 10.5 K/uL   RBC 4.23 4.22 - 5.81 MIL/uL   Hemoglobin 11.7 (L) 13.0 - 17.0 g/dL   HCT 37.6 (L) 39.0 - 52.0 %   MCV 88.9 80.0 - 100.0 fL   MCH 27.7 26.0 - 34.0 pg   MCHC 31.1 30.0 - 36.0 g/dL   RDW 16.2 (H) 11.5 - 15.5 %   Platelets 215 150 - 400 K/uL   nRBC 0.0 0.0 - 0.2 %   Neutrophils Relative % 82 %   Neutro Abs 9.5 (H) 1.7 - 7.7 K/uL   Lymphocytes Relative 8 %   Lymphs Abs 0.9 0.7 - 4.0 K/uL   Monocytes Relative 8 %   Monocytes Absolute 0.9 0.1 - 1.0 K/uL   Eosinophils Relative 1 %   Eosinophils Absolute 0.1 0.0 - 0.5 K/uL   Basophils Relative 0 %   Basophils Absolute 0.0 0.0 - 0.1 K/uL   Immature Granulocytes 1 %   Abs Immature Granulocytes 0.08 (H) 0.00 - 0.07 K/uL    Comment: Performed at [facility], [address], [place], [place] [address]  D-dimer, quantitative (not at [facility])     Status: Abnormal   Collection Time: 09/18/19  5:24 PM  Result Value Ref Range   D-Dimer, Quant 3.76  (H) 0.00 - 0.50 ug/mL-FEU    Comment: (NOTE) At the manufacturer cut-off of 0.50 ug/mL FEU, this assay has been documented to exclude PE with a sensitivity and negative predictive value of 97 to 99%.  At this time, this assay has not been approved by the FDA to exclude DVT/VTE. Results should be correlated with clinical presentation. Performed at [facility], [address] [place], [place] [address]   Creatinine, urine, random     Status: None   Collection Time: 09/18/19  8:17 PM  Result Value Ref Range   Creatinine, Urine 458.5 mg/dL    Comment: RESULTS CONFIRMED BY MANUAL DILUTION Performed at [facility], [address] [place], [place] [address]   Sodium, urine, random     Status: None   Collection Time: 09/18/19  8:17 PM  Result Value Ref Range   Sodium, Ur 47 mmol/L    Comment: Performed at [facility], [address] [place], [place] [address]  Urinalysis, Routine w reflex microscopic     Status: Abnormal   Collection Time: 09/18/19  8:19 PM  Result Value Ref Range   Color, Urine AMBER (A) YELLOW    Comment: BIOCHEMICALS MAY BE AFFECTED BY COLOR   APPearance CLOUDY (A) CLEAR   Specific Gravity, Urine 1.023 1.005 - 1.030   pH 5.0 5.0 - 8.0   Glucose, UA NEGATIVE NEGATIVE mg/dL   Hgb urine dipstick NEGATIVE NEGATIVE   Bilirubin Urine NEGATIVE NEGATIVE   Ketones, ur NEGATIVE NEGATIVE mg/dL   Protein, ur >=300 (A) NEGATIVE mg/dL   Nitrite NEGATIVE NEGATIVE   Leukocytes,Ua NEGATIVE NEGATIVE   RBC / HPF 0-5 0 - 5 RBC/hpf   WBC, UA 0-5 0 - 5 WBC/hpf   Bacteria, UA RARE (A) NONE SEEN   Squamous Epithelial / LPF 0-5 0 - 5   Hyaline Casts, UA PRESENT     Comment: Performed at [facility], [address] [place], [place] [address]  Heparin level (unfractionated)     Status: Abnormal   Collection Time: 09/19/19  2:19 AM  Result Value Ref Range   Heparin Unfractionated 0.19 (L) 0.30 - 0.70 IU/mL    Comment: (NOTE) If heparin results are below expected values, and patient  dosage has  been confirmed, suggest follow up testing of antithrombin III levels. Performed at [facility], [address] [place], [place] [address]   CBC     Status: Abnormal   Collection Time: 09/19/19  2:19 AM  Result Value Ref Range   WBC 8.2 4.0 - 10.5 K/uL   RBC 3.73 (L) 4.22 - 5.81 MIL/uL   Hemoglobin 10.4 (L) 13.0 - 17.0 g/dL   HCT 34.1 (L) 39.0 - 52.0 %   MCV 91.4 80.0 - 100.0 fL   MCH 27.9 26.0 - 34.0 pg   MCHC 30.5 30.0 - 36.0 g/dL   RDW 16.3 (H) 11.5 - 15.5 %   Platelets 175 150 - 400 K/uL   nRBC 0.0 0.0 - 0.2 %    Comment: Performed at [facility], [address], [place], [place] [address]  ABO/Rh     Status: None   Collection Time: 09/19/19  2:19 AM  Result Value Ref Range   ABO/RH(D)      O POS Performed at [facility], [address] [place],  [address]   Comprehensive metabolic panel     Status: Abnormal   Collection Time: 09/19/19  2:19 AM  Result Value Ref Range   Sodium 137 135 - 145 mmol/L   Potassium 3.3 (L) 3.5 - 5.1 mmol/L   Chloride 107 98 - 111 mmol/L   CO2 22 22 - 32 mmol/L   Glucose, Bld 138 (H) 70 - 99 mg/dL   BUN 23 (H) 6 - 20 mg/dL   Creatinine, Ser 2.36 (H) 0.61 - 1.24 mg/dL   Calcium 8.6 (L) 8.9 - 10.3  mg/dL   Total Protein 7.6 6.5 - 8.1 g/dL   Albumin 2.4 (L) 3.5 - 5.0 g/dL   AST 17 15 - 41 U/L   ALT 17 0 - 44 U/L   Alkaline Phosphatase 50 38 - 126 U/L   Total Bilirubin 0.7 0.3 - 1.2 mg/dL   GFR calc non Af Amer 29 (L) >60 mL/min   GFR calc Af Amer 34 (L) >60 mL/min   Anion gap 8 5 - 15    Comment: Performed at [facility], [address], [place], [place] [address]  C-reactive protein     Status: Abnormal   Collection Time: 09/19/19  2:19 AM  Result Value Ref Range   CRP 23.9 (H) <1.0 mg/dL    Comment: Performed at [facility], [address] [place], [place] [address]  D-dimer, quantitative (not at [facility])     Status: Abnormal   Collection Time: 09/19/19  2:19 AM  Result Value Ref Range   D-Dimer, Quant 4.14 (H) 0.00  - 0.50 ug/mL-FEU    Comment: (NOTE) At the manufacturer cut-off of 0.50 ug/mL FEU, this assay has been documented to exclude PE with a sensitivity and negative predictive value of 97 to 99%.  At this time, this assay has not been approved by the FDA to exclude DVT/VTE. Results should be correlated with clinical presentation. Performed at [facility], [address], [place], [place] [address]   Ferritin     Status: None   Collection Time: 09/19/19  2:19 AM  Result Value Ref Range   Ferritin 181 24 - 336 ng/mL    Comment: Performed at [facility], [address], [place], [place] [address]  Magnesium     Status: None   Collection Time: 09/19/19  2:19 AM  Result Value Ref Range   Magnesium 1.8 1.7 - 2.4 mg/dL    Comment: Performed at [facility], [address] [place], [place] [address]  Phosphorus     Status: None   Collection Time: 09/19/19  2:19 AM  Result Value Ref Range   Phosphorus 3.5 2.5 - 4.6 mg/dL    Comment: Performed at [facility], [address], [place], [place] [address]  Heparin level (unfractionated)     Status: None   Collection Time: 09/19/19  9:24 AM  Result Value Ref Range   Heparin Unfractionated 0.34 0.30 - 0.70 IU/mL    Comment: (NOTE) If heparin results are below expected values, and patient dosage has  been confirmed, suggest follow up testing of antithrombin III levels. Performed at [facility], [address] [place], [place] [address]   Heparin level (unfractionated)     Status: Abnormal   Collection Time: 09/19/19  4:11 PM  Result Value Ref Range   Heparin Unfractionated 0.13 (L) 0.30 - 0.70 IU/mL    Comment: (NOTE) If heparin results are below expected values, and patient dosage has  been confirmed, suggest follow up testing of antithrombin III levels. Performed at [facility], [address] [place],  [address]    NM Pulmonary Perfusion  Result Date: 09/19/2019 CLINICAL DATA:  Evaluate for pulmonary embolus. EXAM: NUCLEAR MEDICINE  PERFUSION LUNG SCAN TECHNIQUE: Perfusion images were obtained in multiple projections after intravenous injection of radiopharmaceutical. Ventilation scans intentionally deferred if perfusion scan and chest x-ray adequate for interpretation during COVID 19 epidemic. RADIOPHARMACEUTICALS:  1.6 mCi Tc-58m MAA IV COMPARISON:  Chest radiograph from 09/18/2019 FINDINGS: There is a small peripheral defect identified within the lateral aspect of the right  upper lobe which is unchanged from 03/08/2018 and is favored to represent an area of chronic post inflammatory change as noted on chest radiograph. There is an otherwise uniform distribution of the radiopharmaceutical in both lungs. IMPRESSION: No findings identified worrisome for acute pulmonary embolus. Electronically Signed   By: Kerby Moors M.D.   On: 09/19/2019 17:00   US RENAL  Result Date: 09/19/2019 CLINICAL DATA:  Acute kidney injury. EXAM: RENAL / URINARY TRACT ULTRASOUND COMPLETE COMPARISON:  03/27/2018 FINDINGS: Right Kidney: Renal measurements: 11.6 x 5.9 x 6.0 cm = volume: 221 mL. Normal renal cortical thickness and echogenicity without focal lesions or hydronephrosis. There is a simple appearing 1.4 cm upper pole cyst noted. Left Kidney: Renal measurements: 12.1 x 6.2 x 6.8 cm = volume: Inter 268 mL. Normal renal cortical thickness and echogenicity without focal lesions or hydronephrosis. Simple appearing 1.4 cm upper pole cyst. Bladder: Appears normal for degree of bladder distention. Other: None. IMPRESSION: 1. Normal renal cortical thickness and echogenicity without hydronephrosis. 2. Small bilateral renal cysts. Electronically Signed   By: Marijo Sanes M.D.   On: 09/19/2019 10:27   US Venous Img Lower Bilateral (DVT)  Result Date: 09/19/2019 CLINICAL DATA:  Elevated D-dimer.  History of COVID-19. EXAM: BILATERAL LOWER EXTREMITY VENOUS DOPPLER ULTRASOUND TECHNIQUE: Gray-scale sonography with graded compression, as well as color Doppler and  duplex ultrasound were performed to evaluate the lower extremity deep venous systems from the level of the common femoral vein and including the common femoral, femoral, profunda femoral, popliteal and calf veins including the posterior tibial, peroneal and gastrocnemius veins when visible. The superficial great saphenous vein was also interrogated. Spectral Doppler was utilized to evaluate flow at rest and with distal augmentation maneuvers in the common femoral, femoral and popliteal veins. COMPARISON:  None. FINDINGS: RIGHT LOWER EXTREMITY Common Femoral Vein: No evidence of thrombus. Normal compressibility, respiratory phasicity and response to augmentation. Saphenofemoral Junction: No evidence of thrombus. Normal compressibility and flow on color Doppler imaging. Profunda Femoral Vein: No evidence of thrombus. Normal compressibility and flow on color Doppler imaging. Femoral Vein: No evidence of thrombus. Normal compressibility, respiratory phasicity and response to augmentation. Popliteal Vein: No evidence of thrombus. Normal compressibility, respiratory phasicity and response to augmentation. Calf Veins: Visualized right deep calf veins are patent without thrombus. Superficial Great Saphenous Vein: No evidence of thrombus. Normal compressibility. Other Findings:  None. LEFT LOWER EXTREMITY Common Femoral Vein: No evidence of thrombus. Normal compressibility, respiratory phasicity and response to augmentation. Saphenofemoral Junction: No evidence of thrombus. Normal compressibility and flow on color Doppler imaging. Profunda Femoral Vein: No evidence of thrombus. Normal compressibility and flow on color Doppler imaging. Femoral Vein: No evidence of thrombus. Normal compressibility, respiratory phasicity and response to augmentation. Popliteal Vein: No evidence of thrombus. Normal compressibility, respiratory phasicity and response to augmentation. Calf Veins: Visualized left deep calf veins are patent without  thrombus. Superficial Great Saphenous Vein: No evidence of thrombus. Normal compressibility. Other Findings:  None. IMPRESSION: No evidence of deep venous thrombosis in either lower extremity. Electronically Signed   By: Markus Daft M.D.   On: 09/19/2019 10:22   DG Chest Port 1 View  Result Date: 09/18/2019 CLINICAL DATA:  First follow-up after being diagnosed with COVID-19 near Christmas. EXAM: PORTABLE CHEST 1 VIEW COMPARISON:  Radiograph 08/29/2019 FINDINGS: Improved though persistent opacities present throughout both lungs. More mixed bandlike areas of scarring and or architectural distortion are present. A right apical lucency is similar to comparison studies and may reflect  some bullous disease. Cardiomediastinal contours are likely accentuated by low volumes and portable technique. No acute osseous or soft tissue abnormality. Degenerative changes are present in the imaged spine and shoulders. IMPRESSION: 1. Improved though persistent opacities throughout both lungs. 2. Stable right apical lucency. This may reflect some bullous disease with additional areas of more chronic scarring. Electronically Signed   By: Lovena Le M.D.   On: 09/18/2019 17:45    Pending Labs Unresulted Labs (From admission, onward)    Start     Ordered   09/20/19 0500  Renal function panel  Tomorrow morning,   R    Question:  Specimen collection method  Answer:  Lab=Lab collect   09/19/19 1930   09/19/19 0500  CBC  Daily,   R     09/18/19 1949   09/19/19 0500  Comprehensive metabolic panel  Daily,   R     09/18/19 2019   09/19/19 0500  C-reactive protein  Daily,   R     09/18/19 2019   09/19/19 0500  D-dimer, quantitative (not at [facility])  Daily,   R     09/18/19 2019   09/19/19 0500  Ferritin  Daily,   R     09/18/19 2019   09/19/19 0500  Magnesium  Daily,   R     09/18/19 2019   09/19/19 0500  Phosphorus  Daily,   R     09/18/19 2019          Vitals/Pain Today's Vitals   09/19/19 1821 09/19/19 1827  09/19/19 1828 09/19/19 1915  BP:  115/68    Pulse:  (!) 107 (!) 107 (!) 108  Resp:      Temp: (!) 102 F (38.9 C)     TempSrc: Oral     SpO2:  91% 91% 93%  Weight:      Height:      PainSc:        Isolation Precautions Airborne and Contact precautions  Medications Medications  sodium chloride flush (NS) 0.9 % injection 3 mL (3 mLs Intravenous Not Given 09/19/19 1126)  0.9 %  sodium chloride infusion ( Intravenous Stopped 09/19/19 1126)  Ipratropium-Albuterol (COMBIVENT) respimat 1 puff (1 puff Inhalation Given 09/19/19 1707)  guaiFENesin-dextromethorphan (ROBITUSSIN DM) 100-10 MG/5ML syrup 10 mL (has no administration in time range)  chlorpheniramine-HYDROcodone (TUSSIONEX) 10-8 MG/5ML suspension 5 mL (has no administration in time range)  ascorbic acid (VITAMIN C) tablet 500 mg (500 mg Oral Given 09/19/19 1117)  zinc sulfate capsule 220 mg (220 mg Oral Given 09/19/19 1117)  acetaminophen (TYLENOL) tablet 650 mg (650 mg Oral Given 09/19/19 1824)  ondansetron (ZOFRAN) tablet 4 mg (has no administration in time range)    Or  ondansetron (ZOFRAN) injection 4 mg (has no administration in time range)  amLODipine (NORVASC) tablet 10 mg (10 mg Oral Not Given 09/19/19 1126)  fenofibrate tablet 160 mg (160 mg Oral Given 09/19/19 1117)  pantoprazole (PROTONIX) EC tablet 40 mg (40 mg Oral Given 09/19/19 1117)  albuterol (VENTOLIN HFA) 108 (90 Base) MCG/ACT inhaler 2 puff (has no administration in time range)  oxyCODONE (Oxy IR/ROXICODONE) immediate release tablet 5-10 mg (has no administration in time range)  senna-docusate (Senokot-S) tablet 1 tablet (0 tablets Oral Hold 09/19/19 1108)  ALPRAZolam (XANAX) tablet 0.5 mg (0.5 mg Oral Given 09/19/19 1116)  enoxaparin (LOVENOX) injection 60 mg (has no administration in time range)  sodium chloride 0.9 % bolus 500 mL (0 mLs Intravenous Stopped 09/18/19 1959)  heparin bolus via infusion 4,000 Units (4,000 Units Intravenous Bolus from Bag 09/18/19 1958)   heparin bolus via infusion 3,000 Units (3,000 Units Intravenous Bolus from Bag 09/19/19 0326)  technetium albumin aggregated (MAA) injection solution 1.5 millicurie (1.6 millicuries Intravenous Contrast Given 09/19/19 1245)  heparin bolus via infusion 3,000 Units (3,000 Units Intravenous Bolus from Bag 09/19/19 1706)    Mobility walks Low fall risk   Focused Assessments    R Recommendations: See Admitting Provider Note  Report given to:   Additional Notes:

## 2019-09-19 NOTE — ED Notes (Signed)
Per admitting doctor heparin is supposed to be discontinued at 0000.

## 2019-09-19 NOTE — Progress Notes (Signed)
[place] for heparin Indication: pulmonary embolism (r/o)  No Known Allergies  Patient Measurements: Height: 5\' 8"  (172.7 cm) Weight: 266 lb 8.6 oz (120.9 kg) IBW/kg (Calculated) : 68.4 Heparin Dosing Weight: HEPARIN DW (KG): 96.1   Vital Signs: Temp: 99.1 F (37.3 C) (01/12 1702) Temp Source: Oral (01/12 1702) BP: 114/95 (01/13 0100) Pulse Rate: 111 (01/13 0100)  Labs: Recent Labs    09/18/19 1724 09/19/19 0219  HGB 11.7* 10.4*  HCT 37.6* 34.1*  PLT 215 175  HEPARINUNFRC  --  0.19*  CREATININE 2.66* 2.36*    Estimated Creatinine Clearance: 42.6 mL/min (A) (by C-G formula based on SCr of 2.36 mg/dL (H)).   Assessment: Mathew Lara a 59 y.o. male requires anticoagulation with a heparin iv infusion for the indication of r/o Pulmonary embolism. Heparin gtt will be started following pharmacy protocol per pharmacy consult. Patient is not on previous oral anticoagulant that will require aPTT/HL correlation before transitioning to only HL monitoring.   Heparin level subtherapeutic (0.19) on gtt at 1550 units/hr. No issues with line or bleeding reported per RN.  Goal of Therapy:  Heparin level 0.3-0.7 units/ml Monitor platelets by anticoagulation protocol: Yes   Plan:  Rebolus 3000 units now Increase heparin gtt to 1850 units/hr F/u 6hr heparin level  Sherlon Handing, PharmD, BCPS Please see amion for complete clinical pharmacist phone list 09/19/2019,2:55 AM

## 2019-09-19 NOTE — Progress Notes (Signed)
Patient Demographics:    Mathew Lara, is a 59 y.o. male, DOB - 06-19-1961, FWY:[id number]  Admit date - 09/18/2019   Admitting Physician Fayrene Towner Denton Brick, MD  Outpatient Primary MD for the patient is [name], MD  LOS - 0   Chief Complaint  Patient presents with  . Shortness of Breath        Subjective:    Mathew Lara today has  no emesis,  No chest pain,    Fevers noted with associated tachycardia  Assessment  & Plan :    Principal Problem:   Acute renal failure superimposed on stage 3b chronic kidney disease ([place]) Active Problems:   Essential hypertension   COPD (chronic obstructive pulmonary disease) (H[facility])   AKI (acute kidney injury) ([place])  Brief summary 59 y.o. male with medical history significant for COPD, CKD stage III, hypertension, dyslipidemia, GERD, treated hepatitis C, and recent admission for COVID-19 pneumonia who presents to the ED for evaluation of acute on chronic kidney injury.  Patient had recent admission from 08/29/2019-09/02/2019 for acute hypoxic respiratory failure due to COVID-19 pneumonia.  He completed treatment with IV remdesivir and steroids and was discharged on a prednisone taper.  A/p  1)AKI----acute kidney injury on CKD stage - III, worsening renal function due to dehydration in the setting of nausea and anorexia induced by COVID-19 infection ---   creatinine on admission=2.66  ,   baseline creatinine = 1.47 (09/01/20)--- usual baseline around 1.8   , creatinine is now= 1.87     , renally adjust medications, avoid nephrotoxic agents / dehydration /hypotension -Renal ultrasound without acute findings, continue to hydrate,   2)Elevated D-dimer/possible PE--- VQ scan low probability for PE and lower extremity venous Dopplers negative for PE - may stop IV heparin okay to use Lovenox subcu for DVT prophylaxis   3)Recent COVID-19 pneumonia with acute  Hypoxic Respiratory Failure Admitted 08/29/2019-09/02/2019 and completed treatment with remdesivir and steroids. Chest x-ray with persistent but improving bilateral infiltrates. O2 saturation 88% on room air, improved on 2 L supplemental O2 via [place]. -Continue supplemental oxygen as needed, wean off as able -Continue Combivent, flutter valve, incentive spirometer -Continue vitamin C, zinc - COPD now with acute hypoxic respiratory failure requiring oxygen supplementation -Continue bronchodilators and oxygen supplementation  Hypertension:--Continue amlodipine.  Dyslipidemia: Continue fenofibrate.  Osteoarthritis/chronic pain: Continue home OxyIR as needed.   Disposition/Need for in-Hospital Stay- patient unable to be discharged at this time due to --may need home O2 if hypoxia persists  Code Status : Full code  Family Communication:   (patient is alert, awake and coherent)  -I called and left voicemail for patient's daughter, also called and left a voicemail for patient's wife  Disposition Plan  : Most likely DC home in a.m. if respiratory status improved  Consults  :  *na  DVT Prophylaxis  :  Lovenox -- SCDs  Lab Results  Component Value Date   PLT 175 09/19/2019    Inpatient Medications  Scheduled Meds: . amLODipine  10 mg Oral Daily  . vitamin C  500 mg Oral Daily  . [START ON 09/20/2019] enoxaparin (LOVENOX) injection  60 mg Subcutaneous Q24H  . fenofibrate  160 mg Oral Daily  . Ipratropium-Albuterol  1 puff Inhalation Q6H  .  pantoprazole  40 mg Oral Daily  . senna-docusate  1 tablet Oral BID  . sodium chloride flush  3 mL Intravenous Q12H  . zinc sulfate  220 mg Oral Daily   Continuous Infusions: . heparin 2,000 Units/hr (09/19/19 1826)   PRN Meds:.acetaminophen, albuterol, ALPRAZolam, chlorpheniramine-HYDROcodone, guaiFENesin-dextromethorphan, ondansetron **OR** ondansetron (ZOFRAN) IV, oxyCODONE    Anti-infectives (From admission, onward)   None         Objective:   Vitals:   09/19/19 1800 09/19/19 1821 09/19/19 1827 09/19/19 1828  BP:   115/68   Pulse: (!) 107  (!) 107 (!) 107  Resp:      Temp:  (!) 102 F (38.9 C)    TempSrc:  Oral    SpO2: 96%  91% 91%  Weight:      Height:        Wt Readings from Last 3 Encounters:  09/18/19 120.9 kg  08/29/19 120.9 kg  08/07/19 126.1 kg     Intake/Output Summary (Last 24 hours) at 09/19/2019 1930 Last data filed at 09/19/2019 0422 Gross per 24 hour  Intake --  Output 600 ml  Net -600 ml     Physical Exam  Gen:- Awake Alert, looks tired HEENT:- [place].AT, No sclera icterus Nose- [place] 2 L/min Neck-Supple Neck,No JVD,.  Lungs-few scattered wheezes, overall fair air movement CV- S1, S2 normal, regular  Abd-  +ve B.Sounds, Abd Soft, No tenderness,    Extremity/Skin:- No  edema, pedal pulses present  Psych-affect is appropriate, oriented x3 Neuro-no new focal deficits, no tremors   Data Review:   Micro Results No results found for this or any previous visit (from the past 240 hour(s)).  Radiology Reports NM Pulmonary Perfusion  Result Date: 09/19/2019 CLINICAL DATA:  Evaluate for pulmonary embolus. EXAM: NUCLEAR MEDICINE PERFUSION LUNG SCAN TECHNIQUE: Perfusion images were obtained in multiple projections after intravenous injection of radiopharmaceutical. Ventilation scans intentionally deferred if perfusion scan and chest x-ray adequate for interpretation during COVID 19 epidemic. RADIOPHARMACEUTICALS:  1.6 mCi Tc-33m MAA IV COMPARISON:  Chest radiograph from 09/18/2019 FINDINGS: There is a small peripheral defect identified within the lateral aspect of the right upper lobe which is unchanged from 03/08/2018 and is favored to represent an area of chronic post inflammatory change as noted on chest radiograph. There is an otherwise uniform distribution of the radiopharmaceutical in both lungs. IMPRESSION: No findings identified worrisome for acute pulmonary embolus. Electronically  Signed   By: Kerby Moors M.D.   On: 09/19/2019 17:00   US RENAL  Result Date: 09/19/2019 CLINICAL DATA:  Acute kidney injury. EXAM: RENAL / URINARY TRACT ULTRASOUND COMPLETE COMPARISON:  03/27/2018 FINDINGS: Right Kidney: Renal measurements: 11.6 x 5.9 x 6.0 cm = volume: 221 mL. Normal renal cortical thickness and echogenicity without focal lesions or hydronephrosis. There is a simple appearing 1.4 cm upper pole cyst noted. Left Kidney: Renal measurements: 12.1 x 6.2 x 6.8 cm = volume: Inter 268 mL. Normal renal cortical thickness and echogenicity without focal lesions or hydronephrosis. Simple appearing 1.4 cm upper pole cyst. Bladder: Appears normal for degree of bladder distention. Other: None. IMPRESSION: 1. Normal renal cortical thickness and echogenicity without hydronephrosis. 2. Small bilateral renal cysts. Electronically Signed   By: Marijo Sanes M.D.   On: 09/19/2019 10:27   US Venous Img Lower Bilateral (DVT)  Result Date: 09/19/2019 CLINICAL DATA:  Elevated D-dimer.  History of COVID-19. EXAM: BILATERAL LOWER EXTREMITY VENOUS DOPPLER ULTRASOUND TECHNIQUE: Gray-scale sonography with graded compression, as well as color  Doppler and duplex ultrasound were performed to evaluate the lower extremity deep venous systems from the level of the common femoral vein and including the common femoral, femoral, profunda femoral, popliteal and calf veins including the posterior tibial, peroneal and gastrocnemius veins when visible. The superficial great saphenous vein was also interrogated. Spectral Doppler was utilized to evaluate flow at rest and with distal augmentation maneuvers in the common femoral, femoral and popliteal veins. COMPARISON:  None. FINDINGS: RIGHT LOWER EXTREMITY Common Femoral Vein: No evidence of thrombus. Normal compressibility, respiratory phasicity and response to augmentation. Saphenofemoral Junction: No evidence of thrombus. Normal compressibility and flow on color Doppler  imaging. Profunda Femoral Vein: No evidence of thrombus. Normal compressibility and flow on color Doppler imaging. Femoral Vein: No evidence of thrombus. Normal compressibility, respiratory phasicity and response to augmentation. Popliteal Vein: No evidence of thrombus. Normal compressibility, respiratory phasicity and response to augmentation. Calf Veins: Visualized right deep calf veins are patent without thrombus. Superficial Great Saphenous Vein: No evidence of thrombus. Normal compressibility. Other Findings:  None. LEFT LOWER EXTREMITY Common Femoral Vein: No evidence of thrombus. Normal compressibility, respiratory phasicity and response to augmentation. Saphenofemoral Junction: No evidence of thrombus. Normal compressibility and flow on color Doppler imaging. Profunda Femoral Vein: No evidence of thrombus. Normal compressibility and flow on color Doppler imaging. Femoral Vein: No evidence of thrombus. Normal compressibility, respiratory phasicity and response to augmentation. Popliteal Vein: No evidence of thrombus. Normal compressibility, respiratory phasicity and response to augmentation. Calf Veins: Visualized left deep calf veins are patent without thrombus. Superficial Great Saphenous Vein: No evidence of thrombus. Normal compressibility. Other Findings:  None. IMPRESSION: No evidence of deep venous thrombosis in either lower extremity. Electronically Signed   By: Markus Daft M.D.   On: 09/19/2019 10:22   DG Chest Port 1 View  Result Date: 09/18/2019 CLINICAL DATA:  First follow-up after being diagnosed with COVID-19 near Christmas. EXAM: PORTABLE CHEST 1 VIEW COMPARISON:  Radiograph 08/29/2019 FINDINGS: Improved though persistent opacities present throughout both lungs. More mixed bandlike areas of scarring and or architectural distortion are present. A right apical lucency is similar to comparison studies and may reflect some bullous disease. Cardiomediastinal contours are likely accentuated by low  volumes and portable technique. No acute osseous or soft tissue abnormality. Degenerative changes are present in the imaged spine and shoulders. IMPRESSION: 1. Improved though persistent opacities throughout both lungs. 2. Stable right apical lucency. This may reflect some bullous disease with additional areas of more chronic scarring. Electronically Signed   By: Lovena Le M.D.   On: 09/18/2019 17:45   DG Chest Port 1 View  Result Date: 08/29/2019 CLINICAL DATA:  59 year old male with cough and hypoxia. COVID-19 status pending. EXAM: PORTABLE CHEST 1 VIEW COMPARISON:  Chest radiographs 04/18/2018 and earlier. FINDINGS: Mild chronic peripheral and slightly asymmetric bilateral lung opacity seen in 2019. Stable lung volumes and mediastinal contours. Negative trachea. Evidence of upper lobe emphysema, a especially on the right, where now there is superimposed more confluent pulmonary ground-glass opacity. Increased patchy and indistinct left upper and mid lung opacity also superimposed on the chronic density. No pneumothorax or pleural effusion. Paucity of bowel gas. No acute osseous abnormality identified. IMPRESSION: Chronic lung disease with evidence of Emphysema (ICD10-J43.9). Superimposed bilateral new patchy and indistinct pulmonary opacity compatible with acute viral/atypical respiratory infection and suspicious for COVID-19 pneumonia. No pleural effusion. a Electronically Signed   By: Genevie Ann M.D.   On: 08/29/2019 11:31     CBC Recent  Labs  Lab 09/18/19 1724 09/19/19 0219  WBC 11.5* 8.2  HGB 11.7* 10.4*  HCT 37.6* 34.1*  PLT 215 175  MCV 88.9 91.4  MCH 27.7 27.9  MCHC 31.1 30.5  RDW 16.2* 16.3*  LYMPHSABS 0.9  --   MONOABS 0.9  --   EOSABS 0.1  --   BASOSABS 0.0  --     Chemistries  Recent Labs  Lab 09/18/19 1724 09/19/19 0219  NA 135 137  K 3.4* 3.3*  CL 102 107  CO2 20* 22  GLUCOSE 133* 138*  BUN 22* 23*  CREATININE 2.66* 2.36*  CALCIUM 8.9 8.6*  MG  --  1.8  AST 18  17  ALT 17 17  ALKPHOS 54 50  BILITOT 0.9 0.7   ------------------------------------------------------------------------------------------------------------------ No results for input(s): CHOL, HDL, LDLCALC, TRIG, CHOLHDL, LDLDIRECT in the last 72 hours.  No results found for: HGBA1C ------------------------------------------------------------------------------------------------------------------ No results for input(s): TSH, T4TOTAL, T3FREE, THYROIDAB in the last 72 hours.  Invalid input(s): FREET3 ------------------------------------------------------------------------------------------------------------------ Recent Labs    09/19/19 0219  FERRITIN 181    Coagulation profile No results for input(s): INR, PROTIME in the last 168 hours.  Recent Labs    09/18/19 1724 09/19/19 0219  DDIMER 3.76* 4.14*    Cardiac Enzymes No results for input(s): CKMB, TROPONINI, MYOGLOBIN in the last 168 hours.  Invalid input(s): CK ------------------------------------------------------------------------------------------------------------------ No results found for: BNP   Roxan Hockey M.D on 09/19/2019 at 7:30 PM  Go to [email or web address] - for contact info  [facility] - Office  [phone]

## 2019-09-20 DIAGNOSIS — J449 Chronic obstructive pulmonary disease, unspecified: Secondary | ICD-10-CM | POA: Diagnosis not present

## 2019-09-20 DIAGNOSIS — N1832 Chronic kidney disease, stage 3b: Secondary | ICD-10-CM | POA: Diagnosis not present

## 2019-09-20 DIAGNOSIS — N179 Acute kidney failure, unspecified: Secondary | ICD-10-CM | POA: Diagnosis not present

## 2019-09-20 DIAGNOSIS — I10 Essential (primary) hypertension: Secondary | ICD-10-CM | POA: Diagnosis not present

## 2019-09-20 LAB — COMPREHENSIVE METABOLIC PANEL
ALT: 17 U/L (ref 0–44)
AST: 19 U/L (ref 15–41)
Albumin: 2.6 g/dL — ABNORMAL LOW (ref 3.5–5.0)
Alkaline Phosphatase: 53 U/L (ref 38–126)
Anion gap: 12 (ref 5–15)
BUN: 15 mg/dL (ref 6–20)
CO2: 23 mmol/L (ref 22–32)
Calcium: 9.1 mg/dL (ref 8.9–10.3)
Chloride: 106 mmol/L (ref 98–111)
Creatinine, Ser: 1.87 mg/dL — ABNORMAL HIGH (ref 0.61–1.24)
GFR calc Af Amer: 44 mL/min — ABNORMAL LOW (ref >60)
GFR calc non Af Amer: 38 mL/min — ABNORMAL LOW (ref >60)
Glucose, Bld: 124 mg/dL — ABNORMAL HIGH (ref 70–99)
Potassium: 3.2 mmol/L — ABNORMAL LOW (ref 3.5–5.1)
Sodium: 141 mmol/L (ref 135–145)
Total Bilirubin: 0.7 mg/dL (ref 0.3–1.2)
Total Protein: 8.6 g/dL — ABNORMAL HIGH (ref 6.5–8.1)

## 2019-09-20 LAB — RENAL FUNCTION PANEL
Albumin: 2.5 g/dL — ABNORMAL LOW (ref 3.5–5.0)
Anion gap: 10 (ref 5–15)
BUN: 16 mg/dL (ref 6–20)
CO2: 25 mmol/L (ref 22–32)
Calcium: 9.1 mg/dL (ref 8.9–10.3)
Chloride: 106 mmol/L (ref 98–111)
Creatinine, Ser: 1.91 mg/dL — ABNORMAL HIGH (ref 0.61–1.24)
GFR calc Af Amer: 43 mL/min — ABNORMAL LOW (ref >60)
GFR calc non Af Amer: 37 mL/min — ABNORMAL LOW (ref >60)
Glucose, Bld: 133 mg/dL — ABNORMAL HIGH (ref 70–99)
Phosphorus: 3.6 mg/dL (ref 2.5–4.6)
Potassium: 3.5 mmol/L (ref 3.5–5.1)
Sodium: 141 mmol/L (ref 135–145)

## 2019-09-20 LAB — MAGNESIUM: Magnesium: 1.9 mg/dL (ref 1.7–2.4)

## 2019-09-20 LAB — CBC
HCT: 36.4 % — ABNORMAL LOW (ref 39.0–52.0)
Hemoglobin: 10.8 g/dL — ABNORMAL LOW (ref 13.0–17.0)
MCH: 27.7 pg (ref 26.0–34.0)
MCHC: 29.7 g/dL — ABNORMAL LOW (ref 30.0–36.0)
MCV: 93.3 fL (ref 80.0–100.0)
Platelets: 190 10*3/uL (ref 150–400)
RBC: 3.9 MIL/uL — ABNORMAL LOW (ref 4.22–5.81)
RDW: 16.3 % — ABNORMAL HIGH (ref 11.5–15.5)
WBC: 8 10*3/uL (ref 4.0–10.5)
nRBC: 0 % (ref 0.0–0.2)

## 2019-09-20 LAB — D-DIMER, QUANTITATIVE: D-Dimer, Quant: 6.32 ug/mL-FEU — ABNORMAL HIGH (ref 0.00–0.50)

## 2019-09-20 LAB — FERRITIN: Ferritin: 636 ng/mL — ABNORMAL HIGH (ref 24–336)

## 2019-09-20 LAB — C-REACTIVE PROTEIN: CRP: 23.8 mg/dL — ABNORMAL HIGH (ref <1.0)

## 2019-09-20 LAB — PHOSPHORUS: Phosphorus: 3.6 mg/dL (ref 2.5–4.6)

## 2019-09-20 MED ORDER — ACETAMINOPHEN 325 MG PO TABS: 650.0000 mg | ORAL_TABLET | Freq: Four times a day (QID) | ORAL | 0 refills | Status: DC | PRN

## 2019-09-20 MED ORDER — ASPIRIN EC 81 MG PO TBEC: 81.0000 mg | DELAYED_RELEASE_TABLET | Freq: Two times a day (BID) | ORAL | 0 refills | Status: DC

## 2019-09-20 MED ORDER — ALPRAZOLAM 0.5 MG PO TABS: 0.5000 mg | ORAL_TABLET | Freq: Two times a day (BID) | ORAL | 0 refills | Status: DC | PRN

## 2019-09-20 MED ORDER — POTASSIUM CHLORIDE CRYS ER 20 MEQ PO TBCR
40.0000 meq | EXTENDED_RELEASE_TABLET | Freq: Once | ORAL | Status: AC
  Administered 2019-09-20: 09:00:00 40 meq via ORAL
  Filled 2019-09-20: qty 2

## 2019-09-20 MED ORDER — SENNOSIDES-DOCUSATE SODIUM 8.6-50 MG PO TABS: 2.0000 | ORAL_TABLET | Freq: Every day | ORAL | 1 refills | Status: DC

## 2019-09-20 MED ORDER — POTASSIUM CHLORIDE CRYS ER 20 MEQ PO TBCR
40.0000 meq | EXTENDED_RELEASE_TABLET | Freq: Once | ORAL | Status: AC
  Administered 2019-09-20: 40 meq via ORAL
  Filled 2019-09-20: qty 2

## 2019-09-20 MED ORDER — GUAIFENESIN ER 600 MG PO TB12: 600.0000 mg | ORAL_TABLET | Freq: Two times a day (BID) | ORAL | 0 refills | Status: DC

## 2019-09-20 MED ORDER — PROAIR HFA 108 (90 BASE) MCG/ACT IN AERS: 2.0000 | INHALATION_SPRAY | Freq: Four times a day (QID) | RESPIRATORY_TRACT | 0 refills | Status: DC

## 2019-09-20 NOTE — Plan of Care (Signed)

## 2019-09-20 NOTE — Progress Notes (Signed)
SATURATION QUALIFICATIONS: (This note is used to comply with regulatory documentation for home oxygen)  Patient Saturations on Room Air at Rest = 93%  Patient Saturations on Room Air while Ambulating = 86%  Patient Saturations on 2 Liters of oxygen while Ambulating = 93%  Please briefly explain why patient needs home oxygen: 

## 2019-09-20 NOTE — Discharge Summary (Signed)
Mathew Lara, is a 59 y.o. male  DOB Jan 03, 1961  MRN [id number].  Admission date:  09/18/2019  Admitting Physician  Riannon Mukherjee Denton Brick, MD  Discharge Date:  09/20/2019   Primary MD  [name], MD  Recommendations for primary care physician for things to follow:   1)Please use oxygen as prescribed  2)Avoid ibuprofen/Advil/Aleve/Motrin/Goody Powders/Naproxen/BC powders/Meloxicam/Diclofenac/Indomethacin and other Nonsteroidal anti-inflammatory medications as these will make you more likely to bleed and can cause stomach ulcers, can also cause Kidney problems.   3) You are strongly advised to  to isolate for at least 21 days from the date of your diagnoses with COVID-19 infection--please always wear a mask if you have to go outside the house  4) follow-up with your primary care physician within the week for a virtual/video checkup and follow-up  5) you will need a repeat BMP/kidney and electrolyte blood test in 1 to 2 weeks  Admission Diagnosis  SOB (shortness of breath) [R06.02] Hypoxia [R09.02] AKI (acute kidney injury) ([place]) [N17.9] Elevated d-dimer [R79.89] Acute renal failure superimposed on stage 3b chronic kidney disease ([place]) [N17.9, N18.32]  Discharge Diagnosis  SOB (shortness of breath) [R06.02] Hypoxia [R09.02] AKI (acute kidney injury) ([place]) [N17.9] Elevated d-dimer [R79.89] Acute renal failure superimposed on stage 3b chronic kidney disease ([place]) [N17.9, N18.32]   Principal Problem:   Acute renal failure superimposed on stage 3b chronic kidney disease ([place]) Active Problems:   Essential hypertension   COPD (chronic obstructive pulmonary disease) ([place])   AKI (acute kidney injury) ([place])      Past Medical History:  Diagnosis Date   Alcohol abuse    Anxiety    Bronchitis    Chronic hip pain    Chronic kidney disease (CKD) stage G3b/A3, moderately decreased glomerular filtration  rate (GFR) between 30-44 mL/min/1.73 square meter and albuminuria creatinine ratio greater than 300 mg/g 01/03/2018   COPD (chronic obstructive pulmonary disease) (H[facility])    Hepatitis C    Hypertension    Mixed hyperlipidemia    Renal disorder    states renal dysfunction, "strained kidney"   Sleep apnea     Past Surgical History:  Procedure Laterality Date   COLONOSCOPY N/A 01/23/2014   SLF:The LEFT COLON IS EXTREMELY redundant/TWO RECTAL POLYPS REMOVED/SMALL VOLUME RECTAL BLEEDING MOST LIKELY DUE TO Small internal hemorrhoids   ESOPHAGOGASTRODUODENOSCOPY N/A 01/23/2014   MPN:TIRWERXV ring at the gastroesophageal junction/Medium sized hiatal hernia/NDYSPEPSIA MOST LIKELY DUE TO GERD/MILD Non-erosive gastritis   ESOPHAGOGASTRODUODENOSCOPY N/A 04/18/2018   Procedure: ESOPHAGOGASTRODUODENOSCOPY (EGD);  Surgeon: Daneil Dolin, MD;  [facility];  Service: Endoscopy;  Laterality: N/A;   HEMORRHOID BANDING  01/23/2014   Procedure: HEMORRHOID BANDING;  Surgeon: Danie Binder, MD;  Location: [facility];  Service: Endoscopy;;   JOINT REPLACEMENT Right 2017   MOUTH SURGERY     TOTAL HIP ARTHROPLASTY Right 06/04/2016   Procedure: RIGHT TOTAL HIP ARTHROPLASTY ANTERIOR APPROACH;  Surgeon: Mcarthur Rossetti, MD;  Location: [facility];  Service: Orthopedics;  Laterality: Right;   TOTAL HIP ARTHROPLASTY  Left 03/02/2019   Procedure: LEFT TOTAL HIP ARTHROPLASTY ANTERIOR APPROACH;  Surgeon: Mcarthur Rossetti, MD;  Location: [facility];  Service: Orthopedics;  Laterality: Left;      HPI  from the history and physical done on the day of admission:    - Chief Complaint: Acute kidney injury  HPI: Mathew Lara is a 59 y.o. male with medical history significant for COPD, CKD stage III, hypertension, dyslipidemia, GERD, treated hepatitis C, and recent admission for COVID-19 pneumonia who presents to the ED for evaluation of acute on chronic kidney injury.  Patient had recent  admission from 08/29/2019-09/02/2019 for acute hypoxic respiratory failure due to COVID-19 pneumonia.  He completed treatment with IV remdesivir and steroids and was discharged on a prednisone taper.  Patient states he was doing well on discharge but not completely recovered.  He reports having a persistent nonproductive cough.  Over the last few days he has been having shortness of breath, fatigue, and poor appetite.  He has had chills and body aches.  He reports good urine output but has been seeing dark-colored urine.  He denies any nausea, vomiting, or diarrhea.  He says his last bowel movement was 2 days ago.  He has not had any swelling in his feet or legs.  He denies any NSAID use.  He had a follow-up appointment with his primary care and was noted to have worsening renal function on labs and low oxygen saturation.  He was advised to come to the ED for further evaluation.  ED Course:  Initial vitals showed BP 129/90, pulse 111, RR 22, temp 99.1 Fahrenheit, SPO2 88% on room air.  Labs are notable for BUN 22, creatinine 2.66 (1.47 on 09/02/2019), sodium 135, potassium 3.4, bicarb 20, serum glucose 133, WBC 11.5, hemoglobin 11.7, platelets 215,000, lactic acid 1.0, D-dimer 3.76.  Portable chest x-ray showed improving but persistent airspace opacities throughout both lungs with stable right apical lucency.  Patient was given 500 cc normal saline.  Due to concern for PE in setting of D-dimer and recent COVID-19 infection patient was started on IV heparin per pharmacy.  The hospitalist service was consulted to admit for further evaluation and management   Hospital Course:     Brief Summary:- [name] is a 59 y.o. male with medical history significant for COPD, CKD stage III, hypertension, dyslipidemia, GERD, treated hepatitis C, and recent admission for COVID-19 pneumonia who is admitted with acute on chronic kidney injury.   A/p AKI on CKD stage III: Suspect prerenal from  dehydration with poor oral intake reported in the setting of recent COVID-19 infection -No vomiting or diarrhea -Patient was discharged on 09/02/2019 with a creatinine of 1.47 baseline creatinine is usually around 1.8, -His creatinine on admission this time around was 2.66 -He received IV and oral fluids, discharge creatinine 1.9 which is closer to his previous baseline -Renal ultrasound without significant acute findings   Elevated D-dimer: D-dimer 3.76 on admission.  There was concern for PE given recent COVID-19 infection and tachycardia on arrival.    Patient was empirically treated with IV heparin.  Could not obtain CTA chest PE study due to renal dysfunction.  -Lower extremity Dopplers negative for acute DVT, VQ scan low probability for PE, -IV heparin was discontinued and patient was placed on Lovenox for DVT prophylaxis  Recent COVID-19 pneumonia with acute hypoxic respiratory failure Admitted 08/29/2019-09/02/2019 and completed treatment with remdesivir and steroids.  Chest x-ray with persistent but improving bilateral infiltrates.  O2 saturation 88%  on room air, improved on 2 L supplemental O2 via [place]. -Continue supplemental oxygen as needed, wean off as able -Continue Combivent, flutter valve, incentive spirometer -Continue vitamin C, zinc -Unable to wean off O2 patient has hypoxic respiratory failure with be discharged home on 2 L of oxygen via nasal cannula  COPD now with acute hypoxic respiratory failure requiring oxygen supplementation -Continue bronchodilators and oxygen supplementation  Hypertension: Continue amlodipine.  Dyslipidemia: Continue fenofibrate.  Osteoarthritis/chronic pain: Continue home OxyIR as needed.    Discharge Condition: Stable, continues to require  O2  Follow UP--- PCP as advised  Diet and Activity recommendation:  As advised  Discharge Instructions    Discharge Instructions    Call MD for:  difficulty breathing, headache or visual  disturbances   Complete by: As directed    Call MD for:  extreme fatigue   Complete by: As directed    Call MD for:  persistant dizziness or light-headedness   Complete by: As directed    Call MD for:  persistant nausea and vomiting   Complete by: As directed    Call MD for:  severe uncontrolled pain   Complete by: As directed    Call MD for:  temperature >100.4   Complete by: As directed    Diet - low sodium heart healthy   Complete by: As directed    Discharge instructions   Complete by: As directed    1) please use oxygen as prescribed  2)Avoid ibuprofen/Advil/Aleve/Motrin/Goody Powders/Naproxen/BC powders/Meloxicam/Diclofenac/Indomethacin and other Nonsteroidal anti-inflammatory medications as these will make you more likely to bleed and can cause stomach ulcers, can also cause Kidney problems.   3) You are strongly advised to  to isolate for at least 21 days from the date of your diagnoses with COVID-19 infection--please always wear a mask if you have to go outside the house  4) follow-up with your primary care physician within the week for a virtual/video checkup and follow-up  5) you will need a repeat BMP/kidney and electrolyte blood test in 1 to 2 weeks   Increase activity slowly   Complete by: As directed        Discharge Medications     Allergies as of 09/20/2019   No Known Allergies     Medication List    STOP taking these medications   aspirin 81 MG chewable tablet Replaced by: aspirin EC 81 MG tablet     TAKE these medications   acetaminophen 325 MG tablet Commonly known as: TYLENOL Take 2 tablets (650 mg total) by mouth every 6 (six) hours as needed for mild pain or headache (fever >/= 101).   ALPRAZolam 0.5 MG tablet Commonly known as: XANAX Take 1 tablet (0.5 mg total) by mouth every 12 (twelve) hours as needed for anxiety or sleep.   amLODipine 10 MG tablet Commonly known as: NORVASC Take 10 mg by mouth daily.   ascorbic acid 500 MG  tablet Commonly known as: VITAMIN C Take 1 tablet (500 mg total) by mouth daily.   aspirin EC 81 MG tablet Take 1 tablet (81 mg total) by mouth 2 (two) times daily with a meal. Replaces: aspirin 81 MG chewable tablet   budesonide-formoterol 160-4.5 MCG/ACT inhaler Commonly known as: SYMBICORT Inhale 2 puffs into the lungs 2 (two) times daily.   fenofibrate 160 MG tablet Take 1 tablet (160 mg total) by mouth daily.   guaiFENesin 600 MG 12 hr tablet Commonly known as: Mucinex Take 1 tablet (600 mg total) by mouth  2 (two) times daily for 10 days.   guaiFENesin-codeine 100-10 MG/5ML syrup Take 10 mLs by mouth every 6 (six) hours as needed for cough.   loratadine 10 MG tablet Commonly known as: CLARITIN Take 1 tablet (10 mg total) by mouth daily.   ondansetron 4 MG tablet Commonly known as: ZOFRAN Take 4-8 mg by mouth every 8 (eight) hours as needed for nausea or vomiting.   oxyCODONE 5 MG immediate release tablet Commonly known as: Oxy IR/ROXICODONE Take 1-2 tablets (5-10 mg total) by mouth every 6 (six) hours as needed for moderate pain (pain score 4-6).   pantoprazole 40 MG tablet Commonly known as: Protonix Take 1 tablet (40 mg total) by mouth daily.   ProAir HFA 108 (90 Base) MCG/ACT inhaler Generic drug: albuterol Inhale 2 puffs into the lungs 4 (four) times daily. What changed:   when to take this  reasons to take this   senna-docusate 8.6-50 MG tablet Commonly known as: Senokot-S Take 2 tablets by mouth at bedtime.   zinc sulfate 220 (50 Zn) MG capsule Take 1 capsule (220 mg total) by mouth daily.            Durable Medical Equipment  (From admission, onward)         Start     Ordered   09/20/19 1429  For home use only DME oxygen  Once    Comments: SATURATION QUALIFICATIONS: (This note is used to comply with regulatory documentation for home oxygen)   Patient Saturations on Room Air at Rest =  93%   Patient Saturations on Room Air while  Ambulating = 86 %   Patient Saturations on 2 Liters of oxygen while Ambulating = 93 %    Patient needs continuous O2 at 2 L/min continuously via nasal cannula with humidifier, with gaseous portability and conserving device   Diagnosis-- 1)COPD                      2) COVID-19 infection  Roxan Hockey, MD  Question Answer Comment  Length of Need Lifetime   Mode or (Route) Nasal cannula   Liters per Minute 2   Frequency Continuous (stationary and portable oxygen unit needed)   Oxygen conserving device Yes   Oxygen delivery system Gas      09/20/19 1429          Major procedures and Radiology Reports - PLEASE review detailed and final reports for all details, in brief -   NM Pulmonary Perfusion  Result Date: 09/19/2019 CLINICAL DATA:  Evaluate for pulmonary embolus. EXAM: NUCLEAR MEDICINE PERFUSION LUNG SCAN TECHNIQUE: Perfusion images were obtained in multiple projections after intravenous injection of radiopharmaceutical. Ventilation scans intentionally deferred if perfusion scan and chest x-ray adequate for interpretation during COVID 19 epidemic. RADIOPHARMACEUTICALS:  1.6 mCi Tc-75m MAA IV COMPARISON:  Chest radiograph from 09/18/2019 FINDINGS: There is a small peripheral defect identified within the lateral aspect of the right upper lobe which is unchanged from 03/08/2018 and is favored to represent an area of chronic post inflammatory change as noted on chest radiograph. There is an otherwise uniform distribution of the radiopharmaceutical in both lungs. IMPRESSION: No findings identified worrisome for acute pulmonary embolus. Electronically Signed   By: Kerby Moors M.D.   On: 09/19/2019 17:00   US RENAL  Result Date: 09/19/2019 CLINICAL DATA:  Acute kidney injury. EXAM: RENAL / URINARY TRACT ULTRASOUND COMPLETE COMPARISON:  03/27/2018 FINDINGS: Right Kidney: Renal measurements: 11.6 x 5.9 x 6.0 cm =  volume: 221 mL. Normal renal cortical thickness and echogenicity without  focal lesions or hydronephrosis. There is a simple appearing 1.4 cm upper pole cyst noted. Left Kidney: Renal measurements: 12.1 x 6.2 x 6.8 cm = volume: Inter 268 mL. Normal renal cortical thickness and echogenicity without focal lesions or hydronephrosis. Simple appearing 1.4 cm upper pole cyst. Bladder: Appears normal for degree of bladder distention. Other: None. IMPRESSION: 1. Normal renal cortical thickness and echogenicity without hydronephrosis. 2. Small bilateral renal cysts. Electronically Signed   By: Marijo Sanes M.D.   On: 09/19/2019 10:27   US Venous Img Lower Bilateral (DVT)  Result Date: 09/19/2019 CLINICAL DATA:  Elevated D-dimer.  History of COVID-19. EXAM: BILATERAL LOWER EXTREMITY VENOUS DOPPLER ULTRASOUND TECHNIQUE: Gray-scale sonography with graded compression, as well as color Doppler and duplex ultrasound were performed to evaluate the lower extremity deep venous systems from the level of the common femoral vein and including the common femoral, femoral, profunda femoral, popliteal and calf veins including the posterior tibial, peroneal and gastrocnemius veins when visible. The superficial great saphenous vein was also interrogated. Spectral Doppler was utilized to evaluate flow at rest and with distal augmentation maneuvers in the common femoral, femoral and popliteal veins. COMPARISON:  None. FINDINGS: RIGHT LOWER EXTREMITY Common Femoral Vein: No evidence of thrombus. Normal compressibility, respiratory phasicity and response to augmentation. Saphenofemoral Junction: No evidence of thrombus. Normal compressibility and flow on color Doppler imaging. Profunda Femoral Vein: No evidence of thrombus. Normal compressibility and flow on color Doppler imaging. Femoral Vein: No evidence of thrombus. Normal compressibility, respiratory phasicity and response to augmentation. Popliteal Vein: No evidence of thrombus. Normal compressibility, respiratory phasicity and response to augmentation. Calf  Veins: Visualized right deep calf veins are patent without thrombus. Superficial Great Saphenous Vein: No evidence of thrombus. Normal compressibility. Other Findings:  None. LEFT LOWER EXTREMITY Common Femoral Vein: No evidence of thrombus. Normal compressibility, respiratory phasicity and response to augmentation. Saphenofemoral Junction: No evidence of thrombus. Normal compressibility and flow on color Doppler imaging. Profunda Femoral Vein: No evidence of thrombus. Normal compressibility and flow on color Doppler imaging. Femoral Vein: No evidence of thrombus. Normal compressibility, respiratory phasicity and response to augmentation. Popliteal Vein: No evidence of thrombus. Normal compressibility, respiratory phasicity and response to augmentation. Calf Veins: Visualized left deep calf veins are patent without thrombus. Superficial Great Saphenous Vein: No evidence of thrombus. Normal compressibility. Other Findings:  None. IMPRESSION: No evidence of deep venous thrombosis in either lower extremity. Electronically Signed   By: Markus Daft M.D.   On: 09/19/2019 10:22   DG Chest Port 1 View  Result Date: 09/18/2019 CLINICAL DATA:  First follow-up after being diagnosed with COVID-19 near Christmas. EXAM: PORTABLE CHEST 1 VIEW COMPARISON:  Radiograph 08/29/2019 FINDINGS: Improved though persistent opacities present throughout both lungs. More mixed bandlike areas of scarring and or architectural distortion are present. A right apical lucency is similar to comparison studies and may reflect some bullous disease. Cardiomediastinal contours are likely accentuated by low volumes and portable technique. No acute osseous or soft tissue abnormality. Degenerative changes are present in the imaged spine and shoulders. IMPRESSION: 1. Improved though persistent opacities throughout both lungs. 2. Stable right apical lucency. This may reflect some bullous disease with additional areas of more chronic scarring.  Electronically Signed   By: Lovena Le M.D.   On: 09/18/2019 17:45   DG Chest Port 1 View  Result Date: 08/29/2019 CLINICAL DATA:  59 year old male with cough and hypoxia. COVID-19 status  pending. EXAM: PORTABLE CHEST 1 VIEW COMPARISON:  Chest radiographs 04/18/2018 and earlier. FINDINGS: Mild chronic peripheral and slightly asymmetric bilateral lung opacity seen in 2019. Stable lung volumes and mediastinal contours. Negative trachea. Evidence of upper lobe emphysema, a especially on the right, where now there is superimposed more confluent pulmonary ground-glass opacity. Increased patchy and indistinct left upper and mid lung opacity also superimposed on the chronic density. No pneumothorax or pleural effusion. Paucity of bowel gas. No acute osseous abnormality identified. IMPRESSION: Chronic lung disease with evidence of Emphysema (ICD10-J43.9). Superimposed bilateral new patchy and indistinct pulmonary opacity compatible with acute viral/atypical respiratory infection and suspicious for COVID-19 pneumonia. No pleural effusion. a Electronically Signed   By: Genevie Ann M.D.   On: 08/29/2019 11:31    Micro Results   No results found for this or any previous visit (from the past 240 hour(s)).     Today   Subjective    Scotti Kosta today has no new complaints -Dyspnea on exertion and hypoxia persist no chest pains no palpitations no dizziness          Patient has been seen and examined prior to discharge   Objective   Blood pressure 121/90, pulse (!) 105, temperature 98.1 F (36.7 C), temperature source Oral, resp. rate 19, height 5\' 8"  (1.727 m), weight 117.6 kg, SpO2 94 %.   Intake/Output Summary (Last 24 hours) at 09/20/2019 1646 Last data filed at 09/20/2019 1353 Gross per 24 hour  Intake 730 ml  Output 1300 ml  Net -570 ml    Exam Gen:- Awake Alert, no acute distress  HEENT:- [place].AT, No sclera icterus Nose- [place] 2 L/min Neck-Supple Neck,No JVD,.  Lungs-improving air  movement bilaterally, no wheezing CV- S1, S2 normal, regular Abd-  +ve B.Sounds, Abd Soft, No tenderness,    Extremity/Skin:- No  edema,   good pulses Psych-affect is appropriate, oriented x3 Neuro-no new focal deficits, no tremors    Data Review   CBC w Diff:  Lab Results  Component Value Date   WBC 8.0 09/20/2019   HGB 10.8 (L) 09/20/2019   HCT 36.4 (L) 09/20/2019   PLT 190 09/20/2019   LYMPHOPCT 8 09/18/2019   MONOPCT 8 09/18/2019   EOSPCT 1 09/18/2019   BASOPCT 0 09/18/2019    CMP:  Lab Results  Component Value Date   NA 141 09/20/2019   NA 141 09/20/2019   K 3.2 (L) 09/20/2019   K 3.5 09/20/2019   CL 106 09/20/2019   CL 106 09/20/2019   CO2 23 09/20/2019   CO2 25 09/20/2019   BUN 15 09/20/2019   BUN 16 09/20/2019   CREATININE 1.87 (H) 09/20/2019   CREATININE 1.91 (H) 09/20/2019   PROT 8.6 (H) 09/20/2019   ALBUMIN 2.6 (L) 09/20/2019   ALBUMIN 2.5 (L) 09/20/2019   BILITOT 0.7 09/20/2019   ALKPHOS 53 09/20/2019   AST 19 09/20/2019   ALT 17 09/20/2019  .   Total Discharge time is about 33 minutes  Roxan Hockey M.D on 09/20/2019 at 4:46 PM  Go to [email or web address] -  for contact info  [facility] - Office  [phone]

## 2019-09-20 NOTE — Discharge Instructions (Signed)
1) please use oxygen as prescribed  2)Avoid ibuprofen/Advil/Aleve/Motrin/Goody Powders/Naproxen/BC powders/Meloxicam/Diclofenac/Indomethacin and other Nonsteroidal anti-inflammatory medications as these will make you more likely to bleed and can cause stomach ulcers, can also cause Kidney problems.   3) You are strongly advised to  to isolate for at least 21 days from the date of your diagnoses with COVID-19 infection--please always wear a mask if you have to go outside the house  4) follow-up with your primary care physician within the week for a virtual/video checkup and follow-up  5) you will need a repeat BMP/kidney and electrolyte blood test in 1 to 2 weeks

## 2019-09-20 NOTE — Progress Notes (Addendum)
SATURATION QUALIFICATIONS: (This note is used to comply with regulatory documentation for home oxygen)   Patient Saturations on Room Air at Rest =  93%   Patient Saturations on Room Air while Ambulating = 86 %   Patient Saturations on 2 Liters of oxygen while Ambulating = 93 %    Patient needs continuous O2 at 2 L/min continuously via nasal cannula with humidifier, with gaseous portability and conserving device   Diagnosis-- 1)COPD                      2) COVID-19 infection  Roxan Hockey, MD

## 2019-09-21 ENCOUNTER — Emergency Department (HOSPITAL_COMMUNITY)
Admission: EM | Admit: 2019-09-21 | Discharge: 2019-09-21 | Disposition: A | Discharge status: Home / Self Care | Payer: BC Managed Care – PPO | Attending: Emergency Medicine | Admitting: Emergency Medicine

## 2019-09-21 ENCOUNTER — Other Ambulatory Visit: Payer: Self-pay

## 2019-09-21 ENCOUNTER — Encounter (HOSPITAL_COMMUNITY): Payer: Self-pay | Admitting: Emergency Medicine

## 2019-09-21 DIAGNOSIS — R4182 Altered mental status, unspecified: Secondary | ICD-10-CM | POA: Diagnosis not present

## 2019-09-21 DIAGNOSIS — Z5321 Procedure and treatment not carried out due to patient leaving prior to being seen by health care provider: Secondary | ICD-10-CM | POA: Insufficient documentation

## 2019-09-21 NOTE — ED Notes (Signed)
Consulted EDP concerning pt symptoms. No orders given at this time.

## 2019-09-21 NOTE — ED Notes (Signed)
Call from [facility]  Daughter  [phone]  Would like to speak w CN in regards to father who is in [place]  She is educated that there are no beds currently available, that we are holding admissions, that we have all beds filled and will get his seen as soon as we can

## 2019-09-21 NOTE — ED Notes (Signed)
Pt stated was leaving ED. Pt alert, nad noted.

## 2019-09-21 NOTE — ED Triage Notes (Signed)
Pt reports was discharged from 300 last night. Pt reports diagnosed with covid on 12/17, pt reports two other admissions since then related to covid. Pt reports intermittent episodes of confusion/ garbled speech. Pt reports "ill have a thought and then my mind just trails off and I forget what I was doing." pt denies any cp, shortness of breath, extremity numbness/tinglinig. Facial symmetry noted. No vision changes reported.

## 2019-09-26 DIAGNOSIS — N289 Disorder of kidney and ureter, unspecified: Secondary | ICD-10-CM | POA: Diagnosis not present

## 2019-09-26 DIAGNOSIS — Z6838 Body mass index (BMI) 38.0-38.9, adult: Secondary | ICD-10-CM | POA: Diagnosis not present

## 2019-09-26 DIAGNOSIS — J449 Chronic obstructive pulmonary disease, unspecified: Secondary | ICD-10-CM | POA: Diagnosis not present

## 2019-09-26 DIAGNOSIS — U071 COVID-19: Secondary | ICD-10-CM | POA: Diagnosis not present

## 2019-09-28 ENCOUNTER — Other Ambulatory Visit: Payer: Self-pay

## 2019-09-28 ENCOUNTER — Inpatient Hospital Stay (HOSPITAL_COMMUNITY)
Admission: EM | Admit: 2019-09-28 | Discharge: 2019-10-02 | DRG: 190 | Disposition: A | Discharge status: Home / Self Care | Payer: BC Managed Care – PPO | Attending: Internal Medicine | Admitting: Internal Medicine

## 2019-09-28 ENCOUNTER — Emergency Department (HOSPITAL_COMMUNITY): Payer: BC Managed Care – PPO

## 2019-09-28 ENCOUNTER — Encounter (HOSPITAL_COMMUNITY): Payer: Self-pay

## 2019-09-28 DIAGNOSIS — E782 Mixed hyperlipidemia: Secondary | ICD-10-CM | POA: Diagnosis present

## 2019-09-28 DIAGNOSIS — G894 Chronic pain syndrome: Secondary | ICD-10-CM | POA: Diagnosis present

## 2019-09-28 DIAGNOSIS — I10 Essential (primary) hypertension: Secondary | ICD-10-CM | POA: Diagnosis not present

## 2019-09-28 DIAGNOSIS — Z87891 Personal history of nicotine dependence: Secondary | ICD-10-CM

## 2019-09-28 DIAGNOSIS — I129 Hypertensive chronic kidney disease with stage 1 through stage 4 chronic kidney disease, or unspecified chronic kidney disease: Secondary | ICD-10-CM | POA: Diagnosis not present

## 2019-09-28 DIAGNOSIS — N179 Acute kidney failure, unspecified: Secondary | ICD-10-CM | POA: Diagnosis not present

## 2019-09-28 DIAGNOSIS — Z8616 Personal history of COVID-19: Secondary | ICD-10-CM

## 2019-09-28 DIAGNOSIS — J9621 Acute and chronic respiratory failure with hypoxia: Secondary | ICD-10-CM | POA: Diagnosis not present

## 2019-09-28 DIAGNOSIS — B948 Sequelae of other specified infectious and parasitic diseases: Secondary | ICD-10-CM | POA: Diagnosis not present

## 2019-09-28 DIAGNOSIS — N1832 Chronic kidney disease, stage 3b: Secondary | ICD-10-CM | POA: Diagnosis not present

## 2019-09-28 DIAGNOSIS — J849 Interstitial pulmonary disease, unspecified: Secondary | ICD-10-CM

## 2019-09-28 DIAGNOSIS — Z809 Family history of malignant neoplasm, unspecified: Secondary | ICD-10-CM

## 2019-09-28 DIAGNOSIS — U071 COVID-19: Secondary | ICD-10-CM | POA: Diagnosis not present

## 2019-09-28 DIAGNOSIS — E785 Hyperlipidemia, unspecified: Secondary | ICD-10-CM | POA: Diagnosis present

## 2019-09-28 DIAGNOSIS — J9601 Acute respiratory failure with hypoxia: Secondary | ICD-10-CM

## 2019-09-28 DIAGNOSIS — K219 Gastro-esophageal reflux disease without esophagitis: Secondary | ICD-10-CM | POA: Diagnosis not present

## 2019-09-28 DIAGNOSIS — M879 Osteonecrosis, unspecified: Secondary | ICD-10-CM | POA: Diagnosis present

## 2019-09-28 DIAGNOSIS — Z9981 Dependence on supplemental oxygen: Secondary | ICD-10-CM | POA: Diagnosis not present

## 2019-09-28 DIAGNOSIS — J441 Chronic obstructive pulmonary disease with (acute) exacerbation: Principal | ICD-10-CM | POA: Diagnosis present

## 2019-09-28 DIAGNOSIS — R0602 Shortness of breath: Secondary | ICD-10-CM | POA: Diagnosis not present

## 2019-09-28 LAB — CBC WITH DIFFERENTIAL/PLATELET
Abs Immature Granulocytes: 0.72 10*3/uL — ABNORMAL HIGH (ref 0.00–0.07)
Basophils Absolute: 0.1 10*3/uL (ref 0.0–0.1)
Basophils Relative: 1 %
Eosinophils Absolute: 0.2 10*3/uL (ref 0.0–0.5)
Eosinophils Relative: 1 %
HCT: 42.9 % (ref 39.0–52.0)
Hemoglobin: 12.6 g/dL — ABNORMAL LOW (ref 13.0–17.0)
Immature Granulocytes: 5 %
Lymphocytes Relative: 10 %
Lymphs Abs: 1.4 10*3/uL (ref 0.7–4.0)
MCH: 27.4 pg (ref 26.0–34.0)
MCHC: 29.4 g/dL — ABNORMAL LOW (ref 30.0–36.0)
MCV: 93.3 fL (ref 80.0–100.0)
Monocytes Absolute: 1.2 10*3/uL — ABNORMAL HIGH (ref 0.1–1.0)
Monocytes Relative: 8 %
Neutro Abs: 10.8 10*3/uL — ABNORMAL HIGH (ref 1.7–7.7)
Neutrophils Relative %: 75 %
Platelets: 745 10*3/uL — ABNORMAL HIGH (ref 150–400)
RBC: 4.6 MIL/uL (ref 4.22–5.81)
RDW: 16.7 % — ABNORMAL HIGH (ref 11.5–15.5)
WBC: 14.4 10*3/uL — ABNORMAL HIGH (ref 4.0–10.5)
nRBC: 0.2 % (ref 0.0–0.2)

## 2019-09-28 LAB — BASIC METABOLIC PANEL
Anion gap: 13 (ref 5–15)
BUN: 19 mg/dL (ref 6–20)
CO2: 23 mmol/L (ref 22–32)
Calcium: 9.4 mg/dL (ref 8.9–10.3)
Chloride: 102 mmol/L (ref 98–111)
Creatinine, Ser: 3.12 mg/dL — ABNORMAL HIGH (ref 0.61–1.24)
GFR calc Af Amer: 24 mL/min — ABNORMAL LOW (ref >60)
GFR calc non Af Amer: 21 mL/min — ABNORMAL LOW (ref >60)
Glucose, Bld: 146 mg/dL — ABNORMAL HIGH (ref 70–99)
Potassium: 4.5 mmol/L (ref 3.5–5.1)
Sodium: 138 mmol/L (ref 135–145)

## 2019-09-28 LAB — BLOOD GAS, ARTERIAL
Acid-base deficit: 1.3 mmol/L (ref 0.0–2.0)
Bicarbonate: 24.1 mmol/L (ref 20.0–28.0)
Drawn by: 103701
FIO2: 100
O2 Saturation: 94.1 %
Patient temperature: 98.6
pCO2 arterial: 45.5 mmHg (ref 32.0–48.0)
pH, Arterial: 7.343 — ABNORMAL LOW (ref 7.350–7.450)
pO2, Arterial: 78.5 mmHg — ABNORMAL LOW (ref 83.0–108.0)

## 2019-09-28 LAB — TROPONIN I (HIGH SENSITIVITY)
Troponin I (High Sensitivity): 15 ng/L (ref <18)
Troponin I (High Sensitivity): 19 ng/L — ABNORMAL HIGH (ref <18)

## 2019-09-28 LAB — BRAIN NATRIURETIC PEPTIDE: B Natriuretic Peptide: 61.1 pg/mL (ref 0.0–100.0)

## 2019-09-28 LAB — PROCALCITONIN: Procalcitonin: 0.11 ng/mL

## 2019-09-28 MED ORDER — ALBUTEROL SULFATE HFA 108 (90 BASE) MCG/ACT IN AERS
6.0000 | INHALATION_SPRAY | Freq: Once | RESPIRATORY_TRACT | Status: AC
  Administered 2019-09-28: 6 via RESPIRATORY_TRACT
  Filled 2019-09-28: qty 6.7

## 2019-09-28 MED ORDER — ONDANSETRON HCL 4 MG/2ML IJ SOLN: 4.0000 mg | Freq: Four times a day (QID) | INTRAMUSCULAR | Status: DC | PRN

## 2019-09-28 MED ORDER — METHYLPREDNISOLONE SODIUM SUCC 125 MG IJ SOLR
60.0000 mg | Freq: Two times a day (BID) | INTRAMUSCULAR | Status: DC
  Administered 2019-09-28 – 2019-10-02 (×8): 60 mg via INTRAVENOUS
  Filled 2019-09-28 (×8): qty 2

## 2019-09-28 MED ORDER — AMLODIPINE BESYLATE 10 MG PO TABS
10.0000 mg | ORAL_TABLET | Freq: Every day | ORAL | Status: DC
  Administered 2019-09-28 – 2019-10-02 (×5): 10 mg via ORAL
  Filled 2019-09-28 (×5): qty 1

## 2019-09-28 MED ORDER — ACETAMINOPHEN 325 MG PO TABS: 650.0000 mg | ORAL_TABLET | Freq: Four times a day (QID) | ORAL | Status: DC | PRN

## 2019-09-28 MED ORDER — ALPRAZOLAM 0.5 MG PO TABS
0.5000 mg | ORAL_TABLET | Freq: Two times a day (BID) | ORAL | Status: DC | PRN
  Administered 2019-09-29 – 2019-10-01 (×3): 0.5 mg via ORAL
  Filled 2019-09-28 (×3): qty 1

## 2019-09-28 MED ORDER — HEPARIN SODIUM (PORCINE) 5000 UNIT/ML IJ SOLN
5000.0000 [IU] | Freq: Three times a day (TID) | INTRAMUSCULAR | Status: DC
  Administered 2019-09-28 – 2019-10-02 (×12): 5000 [IU] via SUBCUTANEOUS
  Filled 2019-09-28 (×10): qty 1

## 2019-09-28 MED ORDER — GUAIFENESIN-CODEINE 100-10 MG/5ML PO SOLN
10.0000 mL | Freq: Four times a day (QID) | ORAL | Status: DC | PRN
  Administered 2019-09-29 – 2019-10-02 (×4): 10 mL via ORAL
  Filled 2019-09-28 (×4): qty 10

## 2019-09-28 MED ORDER — IPRATROPIUM BROMIDE HFA 17 MCG/ACT IN AERS
2.0000 | INHALATION_SPRAY | Freq: Once | RESPIRATORY_TRACT | Status: AC
  Administered 2019-09-28: 2 via RESPIRATORY_TRACT
  Filled 2019-09-28: qty 12.9

## 2019-09-28 MED ORDER — METHYLPREDNISOLONE SODIUM SUCC 125 MG IJ SOLR
125.0000 mg | Freq: Once | INTRAMUSCULAR | Status: AC
  Administered 2019-09-28: 125 mg via INTRAVENOUS
  Filled 2019-09-28: qty 2

## 2019-09-28 MED ORDER — ASCORBIC ACID 500 MG PO TABS
500.0000 mg | ORAL_TABLET | Freq: Every day | ORAL | Status: DC
  Administered 2019-09-29 – 2019-10-02 (×4): 500 mg via ORAL
  Filled 2019-09-28 (×4): qty 1

## 2019-09-28 MED ORDER — SODIUM CHLORIDE 0.9 % IV SOLN: INTRAVENOUS | Status: DC

## 2019-09-28 MED ORDER — ALBUTEROL SULFATE HFA 108 (90 BASE) MCG/ACT IN AERS: 2.0000 | INHALATION_SPRAY | RESPIRATORY_TRACT | Status: DC | PRN

## 2019-09-28 MED ORDER — LORATADINE 10 MG PO TABS
10.0000 mg | ORAL_TABLET | Freq: Every day | ORAL | Status: DC
  Administered 2019-09-29 – 2019-10-02 (×4): 10 mg via ORAL
  Filled 2019-09-28 (×4): qty 1

## 2019-09-28 MED ORDER — MAGNESIUM SULFATE 50 % IJ SOLN: 2.0000 g | Freq: Once | INTRAMUSCULAR | Status: DC

## 2019-09-28 MED ORDER — SENNOSIDES-DOCUSATE SODIUM 8.6-50 MG PO TABS
2.0000 | ORAL_TABLET | Freq: Every day | ORAL | Status: DC
  Administered 2019-09-28 – 2019-10-01 (×4): 2 via ORAL
  Filled 2019-09-28 (×4): qty 2

## 2019-09-28 MED ORDER — ONDANSETRON HCL 4 MG PO TABS: 4.0000 mg | ORAL_TABLET | Freq: Four times a day (QID) | ORAL | Status: DC | PRN

## 2019-09-28 MED ORDER — AZITHROMYCIN 250 MG PO TABS
500.0000 mg | ORAL_TABLET | Freq: Every day | ORAL | Status: DC
  Administered 2019-09-28 – 2019-09-30 (×3): 500 mg via ORAL
  Filled 2019-09-28 (×3): qty 2

## 2019-09-28 MED ORDER — SODIUM CHLORIDE 0.9 % IV SOLN
2.0000 g | INTRAVENOUS | Status: DC
  Administered 2019-09-28 – 2019-09-29 (×2): 2 g via INTRAVENOUS
  Filled 2019-09-28 (×2): qty 2
  Filled 2019-09-28: qty 20

## 2019-09-28 MED ORDER — ZINC SULFATE 220 (50 ZN) MG PO CAPS
220.0000 mg | ORAL_CAPSULE | Freq: Every day | ORAL | Status: DC
  Administered 2019-09-29 – 2019-10-02 (×4): 220 mg via ORAL
  Filled 2019-09-28 (×3): qty 1

## 2019-09-28 MED ORDER — GUAIFENESIN ER 600 MG PO TB12
600.0000 mg | ORAL_TABLET | Freq: Two times a day (BID) | ORAL | Status: DC
  Administered 2019-09-28 – 2019-10-02 (×8): 600 mg via ORAL
  Filled 2019-09-28 (×8): qty 1

## 2019-09-28 MED ORDER — ASPIRIN EC 81 MG PO TBEC
81.0000 mg | DELAYED_RELEASE_TABLET | Freq: Two times a day (BID) | ORAL | Status: DC
  Administered 2019-09-29 – 2019-10-02 (×7): 81 mg via ORAL
  Filled 2019-09-28 (×7): qty 1

## 2019-09-28 MED ORDER — OXYCODONE HCL 5 MG PO TABS
5.0000 mg | ORAL_TABLET | Freq: Four times a day (QID) | ORAL | Status: DC | PRN
  Administered 2019-09-30: 5 mg via ORAL
  Administered 2019-10-01: 10 mg via ORAL
  Filled 2019-09-28: qty 2
  Filled 2019-09-28: qty 1

## 2019-09-28 MED ORDER — MAGNESIUM SULFATE 2 GM/50ML IV SOLN
2.0000 g | INTRAVENOUS | Status: AC
  Administered 2019-09-28: 2 g via INTRAVENOUS
  Filled 2019-09-28: qty 50

## 2019-09-28 MED ORDER — SODIUM CHLORIDE 0.9 % IV BOLUS
1000.0000 mL | Freq: Once | INTRAVENOUS | Status: AC
  Administered 2019-09-28: 1000 mL via INTRAVENOUS

## 2019-09-28 MED ORDER — MOMETASONE FURO-FORMOTEROL FUM 200-5 MCG/ACT IN AERO
2.0000 | INHALATION_SPRAY | Freq: Two times a day (BID) | RESPIRATORY_TRACT | Status: DC
  Administered 2019-09-28 – 2019-10-02 (×8): 2 via RESPIRATORY_TRACT
  Filled 2019-09-28: qty 8.8

## 2019-09-28 NOTE — ED Triage Notes (Signed)
Patient's wife came to the ED stating her husband needed help getting out of the car. Patient could not follow directions.  Sats in triage were 39% on room air. Patient's wife states the patient went to his doctor 3-4 days ago and said sats were in the 60's and that the patient was diagnosed with Covid the first week of December.

## 2019-09-28 NOTE — ED Provider Notes (Signed)
[facility] DEPT Provider Note   CSN: [id number] Arrival date & time: 09/28/19  1159   History Chief Complaint  Patient presents with  . Low sats    Mathew Lara is a 59 y.o. male with history of COPD, Hep C, HTN, CKD who presents with hypoxia and confusion. He had COVID in Dec and was hospitalized at [facility]. The patient states that he is here because of SOB. He states it started about a week ago but also says he never really has felt better after getting COVID. The SOB comes and goes. He was hospitalized again last week for acute on chronic respiratory failure. D-dimer was 3.76 during that admission. He underwent a VQ scan which was low risk for PE. He was unable to be weaned off O2 and was sent home with O2. He has been on 2L since then. He reports possible intermittent fevers and an intermittent dry cough. He denies chest pain, wheezing, leg swelling. He hasn't been eating or drinking much. His wife was concerned because he's been confused and very weak.   HPI   Past Medical History:  Diagnosis Date  . Alcohol abuse   . Anxiety   . Bronchitis   . Chronic hip pain   . Chronic kidney disease (CKD) stage G3b/A3, moderately decreased glomerular filtration rate (GFR) between 30-44 mL/min/1.73 square meter and albuminuria creatinine ratio greater than 300 mg/g 01/03/2018  . COPD (chronic obstructive pulmonary disease) ([place])   . Hepatitis C   . Hypertension   . Mixed hyperlipidemia   . Renal disorder    states renal dysfunction, "strained kidney"  . Sleep apnea     Patient Active Problem List   Diagnosis Date Noted  . AKI (acute kidney injury) ([place]) 09/19/2019  . Acute renal failure superimposed on stage 3b chronic kidney disease ([place]) 09/18/2019  . Acute respiratory failure with hypoxia ([place])   . Chronic pain syndrome   . Pneumonia due to COVID-19 virus 08/29/2019  . 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 Acute renal failure  ([place]) 03/26/2018  . Rash 03/26/2018  . Chest pain 03/08/2018  . CKD (chronic kidney disease), stage III 03/08/2018  . COPD (chronic obstructive pulmonary disease) ([place]) 03/08/2018  . Avascular necrosis of bone of left hip ([place]) 07/27/2017  . Avascular necrosis of bone of right hip ([place]) 06/04/2016  . Status post total replacement of right hip 06/04/2016  . Hepatitis C 03/21/2015  . History of alcohol abuse 11/08/2013  . Tobacco abuse 11/08/2013  . Essential hypertension 11/07/2013  . Abnormal ECG 11/07/2013    Past Surgical History:  Procedure Laterality Date  . COLONOSCOPY N/A 01/23/2014   SLF:The LEFT COLON IS EXTREMELY redundant/TWO RECTAL POLYPS REMOVED/SMALL VOLUME RECTAL BLEEDING MOST LIKELY DUE TO Small internal hemorrhoids  . ESOPHAGOGASTRODUODENOSCOPY N/A 01/23/2014   XHB:ZJIRCVEL ring at the gastroesophageal junction/Medium sized hiatal hernia/NDYSPEPSIA MOST LIKELY DUE TO GERD/MILD Non-erosive gastritis  . ESOPHAGOGASTRODUODENOSCOPY N/A 04/18/2018   Procedure: ESOPHAGOGASTRODUODENOSCOPY (EGD);  Surgeon: Daneil Dolin, MD;  Location: [facility];  Service: Endoscopy;  Laterality: N/A;  . HEMORRHOID BANDING  01/23/2014   Procedure: HEMORRHOID BANDING;  Surgeon: Danie Binder, MD;  Location: [facility];  Service: Endoscopy;;  . JOINT REPLACEMENT Right 2017  . MOUTH SURGERY    . TOTAL HIP ARTHROPLASTY Right 06/04/2016   Procedure: RIGHT TOTAL HIP ARTHROPLASTY ANTERIOR APPROACH;  Surgeon: Mcarthur Rossetti, MD;  Location: [facility] ORS;  Service: Orthopedics;  Laterality: Right;  .  TOTAL HIP ARTHROPLASTY Left 03/02/2019   Procedure: LEFT TOTAL HIP ARTHROPLASTY ANTERIOR APPROACH;  Surgeon: Mcarthur Rossetti, MD;  Location: [facility] OR[facility];  Service: Orthopedics;  Laterality: Left;       Family History  Problem Relation Age of Onset  . Cancer Mother     Social History   Tobacco Use  . Smoking status: Former Smoker    Types: Cigarettes    Quit date: 05/28/2014    Years  since quitting: 5.3  . Smokeless tobacco: Never Used  . Tobacco comment: quit in November 2015 after smoking x 20 yrs.  Substance Use Topics  . Alcohol use: Not Currently    Alcohol/week: 0.0 standard drinks    Comment: rare  . Drug use: No    Home Medications Prior to Admission medications   Medication Sig Start Date End Date Taking? Authorizing Provider  acetaminophen (TYLENOL) 325 MG tablet Take 2 tablets (650 mg total) by mouth every 6 (six) hours as needed for mild pain or headache (fever >/= 101). 09/20/19   Roxan Hockey, MD  ALPRAZolam Duanne Moron) 0.5 MG tablet Take 1 tablet (0.5 mg total) by mouth every 12 (twelve) hours as needed for anxiety or sleep. 09/20/19   Roxan Hockey, MD  amLODipine (NORVASC) 10 MG tablet Take 10 mg by mouth daily. 02/05/19   [provider]  ascorbic acid (VITAMIN C) 500 MG tablet Take 1 tablet (500 mg total) by mouth daily. 09/03/19   [place] Dubois, MD  aspirin EC 81 MG tablet Take 1 tablet (81 mg total) by mouth 2 (two) times daily with a meal. 09/20/19 09/19/20  Roxan Hockey, MD  budesonide-formoterol (SYMBICORT) 160-4.5 MCG/ACT inhaler Inhale 2 puffs into the lungs 2 (two) times daily.  09/18/19   [provider]  fenofibrate 160 MG tablet Take 1 tablet (160 mg total) by mouth daily. Patient not taking: Reported on 09/19/2019 09/02/19   [place] Dubois, MD  guaiFENesin (MUCINEX) 600 MG 12 hr tablet Take 1 tablet (600 mg total) by mouth 2 (two) times daily for 10 days. 09/20/19 09/30/19  Roxan Hockey, MD  guaiFENesin-codeine 100-10 MG/5ML syrup Take 10 mLs by mouth every 6 (six) hours as needed for cough. 09/02/19   [place] Dubois, MD  loratadine (CLARITIN) 10 MG tablet Take 1 tablet (10 mg total) by mouth daily. 09/02/19   [place] Dubois, MD  ondansetron (ZOFRAN) 4 MG tablet Take 4-8 mg by mouth every 8 (eight) hours as needed for nausea or vomiting.    [provider]  oxyCODONE (OXY IR/ROXICODONE) 5 MG immediate release  tablet Take 1-2 tablets (5-10 mg total) by mouth every 6 (six) hours as needed for moderate pain (pain score 4-6). 03/19/19   Pete Pelt, PA-C  pantoprazole (PROTONIX) 40 MG tablet Take 1 tablet (40 mg total) by mouth daily. Patient not taking: Reported on 09/19/2019 09/02/19 09/01/20  [place] Dubois, MD  [facility] HFA 108 [phone] Base) MCG/ACT inhaler Inhale 2 puffs into the lungs 4 (four) times daily. 09/20/19   Roxan Hockey, MD  senna-docusate (SENOKOT-S) 8.6-50 MG tablet Take 2 tablets by mouth at bedtime. 09/20/19   Roxan Hockey, MD  zinc sulfate 220 (50 Zn) MG capsule Take 1 capsule (220 mg total) by mouth daily. 09/03/19   [place] Dubois, MD    Allergies    Patient has no known allergies.  Review of Systems   Review of Systems  Constitutional: Positive for fever.  Respiratory: Positive for cough and shortness of breath. Negative for wheezing.  Cardiovascular: Negative for chest pain, palpitations and leg swelling.  Gastrointestinal: Negative for abdominal pain.  Neurological: Positive for weakness.  Psychiatric/Behavioral: Positive for confusion.  All other systems reviewed and are negative.   Physical Exam Updated Vital Signs BP 126/86 (BP Location: Left Arm)   Pulse (!) 117   Temp 98.8 F (37.1 C)   Resp (!) 35   Ht 5\' 8"  (1.727 m)   Wt 117 kg   SpO2 100%   BMI 39.22 kg/m   Physical Exam Vitals and nursing note reviewed.  Constitutional:      General: He is in acute distress.     Appearance: Normal appearance. He is well-developed. He is not ill-appearing.     Comments: Tachypneic. On 10L via NRB. Generally weak and fatigued appearing. Dry mucous membranes  HENT:     Head: Normocephalic and atraumatic.  Eyes:     General: No scleral icterus.       Right eye: No discharge.        Left eye: No discharge.     Conjunctiva/sclera: Conjunctivae normal.     Pupils: Pupils are equal, round, and reactive to light.  Cardiovascular:     Rate and Rhythm: Regular  rhythm. Tachycardia present.  Pulmonary:     Effort: Tachypnea and respiratory distress present.     Breath sounds: Normal breath sounds.  Abdominal:     General: There is no distension.     Palpations: Abdomen is soft.     Tenderness: There is no abdominal tenderness.  Musculoskeletal:     Cervical back: Normal range of motion.  Skin:    General: Skin is warm and dry.  Neurological:     Mental Status: He is alert and oriented to person, place, and time.  Psychiatric:        Behavior: Behavior normal.     ED Results / Procedures / Treatments   Labs (all labs ordered are listed, but only abnormal results are displayed) Labs Reviewed  BASIC METABOLIC PANEL - Abnormal; Notable for the following components:      Result Value   Glucose, Bld 146 (*)    Creatinine, Ser 3.12 (*)    GFR calc non Af Amer 21 (*)    GFR calc Af Amer 24 (*)    All other components within normal limits  CBC WITH DIFFERENTIAL/PLATELET - Abnormal; Notable for the following components:   WBC 14.4 (*)    Hemoglobin 12.6 (*)    MCHC 29.4 (*)    RDW 16.7 (*)    Platelets 745 (*)    Neutro Abs 10.8 (*)    Monocytes Absolute 1.2 (*)    Abs Immature Granulocytes 0.72 (*)    All other components within normal limits  BLOOD GAS, ARTERIAL - Abnormal; Notable for the following components:   pH, Arterial 7.343 (*)    pO2, Arterial 78.5 (*)    All other components within normal limits  TROPONIN I (HIGH SENSITIVITY) - Abnormal; Notable for the following components:   Troponin I (High Sensitivity) 19 (*)    All other components within normal limits  BRAIN NATRIURETIC PEPTIDE  PROCALCITONIN  TROPONIN I (HIGH SENSITIVITY)    EKG EKG Interpretation  Date/Time:  Friday September 28 2019 12:07:28 EST Ventricular Rate:  116 PR Interval:    QRS Duration: 83 QT Interval:  298 QTC Calculation: 414 R Axis:   39 Text Interpretation: Sinus tachycardia Baseline wander in lead(s) V2 V3 No significant change since last  tracing Confirmed by Deno Etienne [phone]) on 09/28/2019 12:43:50 PM   Radiology DG Chest Port 1 View  Result Date: 09/28/2019 CLINICAL DATA:  Shortness of breath. Diagnosed with COVID-19 infection last month. History of COPD. EXAM: PORTABLE CHEST 1 VIEW COMPARISON:  09/18/2019 FINDINGS: The cardiomediastinal silhouette is unchanged. Lung volumes are unchanged with persistent mild elevation of the right hemidiaphragm. There is underlying chronic lung disease with suspected bullous disease in the right upper lobe and scarring in the left midlung. The interstitial markings are stable to slightly more prominent diffusely compared to the prior study. No new airspace consolidation, sizeable pleural effusion, pneumothorax is identified. No acute osseous abnormality is seen. IMPRESSION: Chronic lung disease with stable to slightly increased interstitial prominence. Residual/recurrent atypical infection is not excluded. No acute airspace consolidation. Electronically Signed   By: Logan Bores M.D.   On: 09/28/2019 15:29    Procedures Procedures (including critical care time)  CRITICAL CARE Performed by: Recardo Evangelist   Total critical care time: 35 minutes  Critical care time was exclusive of separately billable procedures and treating other patients.  Critical care was necessary to treat or prevent imminent or life-threatening deterioration.  Critical care was time spent personally by me on the following activities: development of treatment plan with patient and/or surrogate as well as nursing, discussions with consultants, evaluation of patient's response to treatment, examination of patient, obtaining history from patient or surrogate, ordering and performing treatments and interventions, ordering and review of laboratory studies, ordering and review of radiographic studies, pulse oximetry and re-evaluation of patient's condition.  Medications Ordered in ED Medications  cefTRIAXone (ROCEPHIN) 2 g  in sodium chloride 0.9 % 100 mL IVPB (has no administration in time range)  azithromycin (ZITHROMAX) tablet 500 mg (has no administration in time range)  sodium chloride 0.9 % bolus 1,000 mL (1,000 mLs Intravenous New Bag/Given (Non-Interop) 09/28/19 1248)  methylPREDNISolone sodium succinate (SOLU-MEDROL) 125 mg/2 mL injection 125 mg (125 mg Intravenous Given 09/28/19 1340)  ipratropium (ATROVENT HFA) inhaler 2 puff (2 puffs Inhalation Given 09/28/19 1340)  albuterol (VENTOLIN HFA) 108 (90 Base) MCG/ACT inhaler 6 puff (6 puffs Inhalation Given 09/28/19 1339)  magnesium sulfate IVPB 2 g 50 mL (0 g Intravenous Stopped 09/28/19 1513)    ED Course  I have reviewed the triage vital signs and the nursing notes.  Pertinent labs & imaging results that were available during my care of the patient were reviewed by me and considered in my medical decision making (see chart for details).  59 year old male presents with acute on chronic respiratory failure. Likely COPD and residual pneumonia. Pt is requiring 10L via high flow [place] here. Will order labs, EKG, CXR, abg and give fluids, albuterol/atrovent, steroids, mag.   EKG is sinus tachycardia. CXR shows COPD with increased interstitial prominence. CBC shows leukocytosis of 14.4, hgb 12, platelets 745. BMP shows AKI - SCr is 3.1 up from 1.9. Baseline ~1.5-1.8  Trop is 15 and 19. BNP is normal. Procal is 0.11. ABG shows pH is 7.3.   Shared visit with Dr. Tyrone Nine. Will admit for further management. Discussed with Dr. Sloan Leiter who will admit.  MDM Rules/Calculators/A&P                       Final Clinical Impression(s) / ED Diagnoses Final diagnoses:  Acute on chronic respiratory failure with hypoxia ([place])  COPD exacerbation (H[facility])  AKI (acute kidney injury) ([place])    Rx / DC Orders ED  Discharge Orders    None       Iris Pert 09/28/19 [facility], [place], DO 09/28/19 1644

## 2019-09-28 NOTE — H&P (Signed)
History and Physical    Mathew Lara JOA:[id number] DOB: [birth year] DOA: 09/28/2019  PCP: [name], MD  Patient coming from: Home.  I have personally briefly reviewed patient's old medical records available.   Chief Complaint: Weakness, lethargy, low oxygen level at home  HPI: Mathew Lara is a 59 y.o. male with medical history significant of recent COVID-19 pneumonia, 1223-09/02/2019 admit to the hospital and discharged on oxygen 2 L, COPD previously not using oxygen, recent admission for exacerbation 09/18/2019-09/20/2019 and discharged on oxygen, CKD stage IIIb with baseline creatinine about 1.5, GERD, hypertension, dyslipidemia, chronic pain syndrome on oxycodone presenting to the emergency department from home with low oxygen saturations at home, intermittent fever, intermittent cough and shortness of breath, poor appetite and not feeling well.  Patient is poor historian.  I reviewed his previous admissions as well as called his wife and discussed with her. According to patient's wife, he never felt to be better after initial episode of COVID-19 infection.  Since then he has intermittent cough, they think he might have fever but they did not measure it, patient states that he has no appetite.  The wife was concerned because he looked confused and very weak so she brought him to the ER. Patient denies any nausea vomiting.  Poor appetite present.  Denies any chest pain.  Patient himself denies any wheezing or shortness of breath on ambulation.  No abdominal pain.  No diarrhea.  Urine is normal.  No focal neurological deficits. ED Course: Tachypneic, hypoxic and put on 10 L oxygen and looks comfortable, tachycardic after nebs. CXR with b/l infiltrates slightly increased from before. WBC 14.4 Creatinine 3.12 Given steroids and magnesium in ER.  Review of Systems: all systems are reviewed and pertinent positive as per HPI otherwise rest are negative.    Past Medical History:  Diagnosis  Date  . Alcohol abuse   . Anxiety   . Bronchitis   . Chronic hip pain   . Chronic kidney disease (CKD) stage G3b/A3, moderately decreased glomerular filtration rate (GFR) between 30-44 mL/min/1.73 square meter and albuminuria creatinine ratio greater than 300 mg/g 01/03/2018  . COPD (chronic obstructive pulmonary disease) ([place])   . Hepatitis C   . Hypertension   . Mixed hyperlipidemia   . Renal disorder    states renal dysfunction, "strained kidney"  . Sleep apnea     Past Surgical History:  Procedure Laterality Date  . COLONOSCOPY N/A 01/23/2014   SLF:The LEFT COLON IS EXTREMELY redundant/TWO RECTAL POLYPS REMOVED/SMALL VOLUME RECTAL BLEEDING MOST LIKELY DUE TO Small internal hemorrhoids  . ESOPHAGOGASTRODUODENOSCOPY N/A 01/23/2014   SWF:UXNATFTD ring at the gastroesophageal junction/Medium sized hiatal hernia/NDYSPEPSIA MOST LIKELY DUE TO GERD/MILD Non-erosive gastritis  . ESOPHAGOGASTRODUODENOSCOPY N/A 04/18/2018   Procedure: ESOPHAGOGASTRODUODENOSCOPY (EGD);  Surgeon: Daneil Dolin, MD;  Location: [facility];  Service: Endoscopy;  Laterality: N/A;  . HEMORRHOID BANDING  01/23/2014   Procedure: HEMORRHOID BANDING;  Surgeon: Danie Binder, MD;  Location: [facility];  Service: Endoscopy;;  . JOINT REPLACEMENT Right 2017  . MOUTH SURGERY    . TOTAL HIP ARTHROPLASTY Right 06/04/2016   Procedure: RIGHT TOTAL HIP ARTHROPLASTY ANTERIOR APPROACH;  Surgeon: Mcarthur Rossetti, MD;  Location: [facility];  Service: Orthopedics;  Laterality: Right;  . TOTAL HIP ARTHROPLASTY Left 03/02/2019   Procedure: LEFT TOTAL HIP ARTHROPLASTY ANTERIOR APPROACH;  Surgeon: Mcarthur Rossetti, MD;  Location: [facility];  Service: Orthopedics;  Laterality: Left;     reports that he quit smoking about  5 years ago. His smoking use included cigarettes. He has never used smokeless tobacco. He reports previous alcohol use. He reports that he does not use drugs.  No Known Allergies  Family History  Problem  Relation Age of Onset  . Cancer Mother      Prior to Admission medications   Medication Sig Start Date End Date Taking? Authorizing Provider  acetaminophen (TYLENOL) 325 MG tablet Take 2 tablets (650 mg total) by mouth every 6 (six) hours as needed for mild pain or headache (fever >/= 101). 09/20/19  Yes Roxan Hockey, MD  ALPRAZolam Duanne Moron) 0.5 MG tablet Take 1 tablet (0.5 mg total) by mouth every 12 (twelve) hours as needed for anxiety or sleep. 09/20/19  Yes Emokpae, Courage, MD  amLODipine (NORVASC) 10 MG tablet Take 10 mg by mouth daily. 02/05/19  Yes [provider]  ascorbic acid (VITAMIN C) 500 MG tablet Take 1 tablet (500 mg total) by mouth daily. 09/03/19  Yes Barton Dubois, MD  aspirin EC 81 MG tablet Take 1 tablet (81 mg total) by mouth 2 (two) times daily with a meal. 09/20/19 09/19/20 Yes Emokpae, Courage, MD  budesonide-formoterol (SYMBICORT) 160-4.5 MCG/ACT inhaler Inhale 2 puffs into the lungs 2 (two) times daily.  09/18/19  Yes [provider]  guaiFENesin (MUCINEX) 600 MG 12 hr tablet Take 1 tablet (600 mg total) by mouth 2 (two) times daily for 10 days. 09/20/19 09/30/19 Yes Emokpae, Courage, MD  guaiFENesin-codeine 100-10 MG/5ML syrup Take 10 mLs by mouth every 6 (six) hours as needed for cough. 09/02/19  Yes Barton Dubois, MD  loratadine (CLARITIN) 10 MG tablet Take 1 tablet (10 mg total) by mouth daily. 09/02/19  Yes Barton Dubois, MD  ondansetron (ZOFRAN) 4 MG tablet Take 4-8 mg by mouth every 8 (eight) hours as needed for nausea or vomiting.   Yes [provider]  oxyCODONE (OXY IR/ROXICODONE) 5 MG immediate release tablet Take 1-2 tablets (5-10 mg total) by mouth every 6 (six) hours as needed for moderate pain (pain score 4-6). 03/19/19  Yes Pete Pelt, PA-C  PROAIR HFA 108 [phone] Base) MCG/ACT inhaler Inhale 2 puffs into the lungs 4 (four) times daily. 09/20/19  Yes Emokpae, Courage, MD  senna-docusate (SENOKOT-S) 8.6-50 MG tablet Take 2 tablets  by mouth at bedtime. 09/20/19  Yes Emokpae, Courage, MD  zinc sulfate 220 (50 Zn) MG capsule Take 1 capsule (220 mg total) by mouth daily. 09/03/19  Yes Barton Dubois, MD  fenofibrate 160 MG tablet Take 1 tablet (160 mg total) by mouth daily. Patient not taking: Reported on 09/19/2019 09/02/19   Barton Dubois, MD  pantoprazole (PROTONIX) 40 MG tablet Take 1 tablet (40 mg total) by mouth daily. Patient not taking: Reported on 09/19/2019 09/02/19 09/01/20  Barton Dubois, MD    Physical Exam: Vitals:   09/28/19 1500 09/28/19 1530 09/28/19 1600 09/28/19 1700  BP: 125/87 118/80 121/82 114/84  Pulse: (!) 111 (!) 104 (!) 104 (!) 104  Resp: (!) 30 (!) 28 (!) 30 (!) 28  Temp:      SpO2: 92% 93% 93% 92%  Weight:      Height:        Constitutional: NAD, calm, comfortable Vitals:   09/28/19 1500 09/28/19 1530 09/28/19 1600 09/28/19 1700  BP: 125/87 118/80 121/82 114/84  Pulse: (!) 111 (!) 104 (!) 104 (!) 104  Resp: (!) 30 (!) 28 (!) 30 (!) 28  Temp:      SpO2: 92% 93% 93% 92%  Weight:      Height:       Eyes: PERRL, lids and conjunctivae normal ENMT: Mucous membranes are dry. Posterior pharynx clear of any exudate or lesions.Normal dentition.  Neck: normal, supple, no masses, no thyromegaly Respiratory: b/l coarse crackles, conducted airway sounds, normal respiratory effort. No accessory muscle use. On 10 liters of oxygen but not in any obvious distress. Cardiovascular: Regular rate and rhythm, no murmurs / rubs / gallops. No extremity edema. 2+ pedal pulses. No carotid bruits.  Abdomen: no tenderness, no masses palpated. No hepatosplenomegaly. Bowel sounds positive.  Musculoskeletal: no clubbing / cyanosis. No joint deformity upper and lower extremities. Good ROM, no contractures. Normal muscle tone.  Skin: no rashes, lesions, ulcers. No induration Neurologic: CN 2-12 grossly intact. Sensation intact, DTR normal. Strength 5/5 in all 4.  Psychiatric: Normal judgment and insight. Alert and  oriented x 3. Normal mood.     Labs on Admission: I have personally reviewed following labs and imaging studies  CBC: Recent Labs  Lab 09/28/19 1237  WBC 14.4*  NEUTROABS 10.8*  HGB 12.6*  HCT 42.9  MCV 93.3  PLT 846*   Basic Metabolic Panel: Recent Labs  Lab 09/28/19 1237  NA 138  K 4.5  CL 102  CO2 23  GLUCOSE 146*  BUN 19  CREATININE 3.12*  CALCIUM 9.4   GFR: Estimated Creatinine Clearance: 31.3 mL/min (A) (by C-G formula based on SCr of 3.12 mg/dL (H)). Liver Function Tests: No results for input(s): AST, ALT, ALKPHOS, BILITOT, PROT, ALBUMIN in the last 168 hours. No results for input(s): LIPASE, AMYLASE in the last 168 hours. No results for input(s): AMMONIA in the last 168 hours. Coagulation Profile: No results for input(s): INR, PROTIME in the last 168 hours. Cardiac Enzymes: No results for input(s): CKTOTAL, CKMB, CKMBINDEX, TROPONINI in the last 168 hours. BNP (last 3 results) No results for input(s): PROBNP in the last 8760 hours. HbA1C: No results for input(s): HGBA1C in the last 72 hours. CBG: No results for input(s): GLUCAP in the last 168 hours. Lipid Profile: No results for input(s): CHOL, HDL, LDLCALC, TRIG, CHOLHDL, LDLDIRECT in the last 72 hours. Thyroid Function Tests: No results for input(s): TSH, T4TOTAL, FREET4, T3FREE, THYROIDAB in the last 72 hours. Anemia Panel: No results for input(s): VITAMINB12, FOLATE, FERRITIN, TIBC, IRON, RETICCTPCT in the last 72 hours. Urine analysis:    Component Value Date/Time   COLORURINE AMBER (A) 09/18/2019 2019   APPEARANCEUR CLOUDY (A) 09/18/2019 2019   LABSPEC 1.023 09/18/2019 2019   PHURINE 5.0 09/18/2019 2019   GLUCOSEU NEGATIVE 09/18/2019 2019   HGBUR NEGATIVE 09/18/2019 2019   BILIRUBINUR NEGATIVE 09/18/2019 2019   KETONESUR NEGATIVE 09/18/2019 2019   PROTEINUR >=300 (A) 09/18/2019 2019   UROBILINOGEN 0.2 07/06/2013 1024   NITRITE NEGATIVE 09/18/2019 2019   LEUKOCYTESUR NEGATIVE 09/18/2019  2019    Radiological Exams on Admission: DG Chest Port 1 View  Result Date: 09/28/2019 CLINICAL DATA:  Shortness of breath. Diagnosed with COVID-19 infection last month. History of COPD. EXAM: PORTABLE CHEST 1 VIEW COMPARISON:  09/18/2019 FINDINGS: The cardiomediastinal silhouette is unchanged. Lung volumes are unchanged with persistent mild elevation of the right hemidiaphragm. There is underlying chronic lung disease with suspected bullous disease in the right upper lobe and scarring in the left midlung. The interstitial markings are stable to slightly more prominent diffusely compared to the prior study. No new airspace consolidation, sizeable pleural effusion, pneumothorax is identified. No acute osseous abnormality is seen. IMPRESSION: Chronic lung  disease with stable to slightly increased interstitial prominence. Residual/recurrent atypical infection is not excluded. No acute airspace consolidation. Electronically Signed   By: Logan Bores M.D.   On: 09/28/2019 15:29    EKG: Independently reviewed. Sinus tachycardia, no acute changes   Assessment/Plan Principal Problem:   COPD with acute exacerbation (HCC) Active Problems:   Essential hypertension   CKD (chronic kidney disease), stage III   Acute renal failure (HCC)   Pneumonia due to COVID-19 virus   Chronic pain syndrome   Acute renal failure superimposed on stage 3b chronic kidney disease ([place])     1. COPD with acute exacerbation: with acute on chronic hypoxic failure:  Agree with admission to monitored unit because of severity of symptoms. Aggressive bronchodilator therapy, IV steroids, inhalational steroids, scheduled and as needed bronchodilators, deep breathing exercises, incentive spirometry, chest physiotherapy and respiratory therapy consult. Patient may have secondary infection.  Procalcitonin normal.  Patient is nontoxic looking.   We will cover for community-acquired pneumonia with Rocephin and azithromycin.  Sputum  cultures. Supplemental oxygen to keep saturations more than 89%.  2. Recent COVID19 infection with persistent respiratory symptoms: No indication for antiviral therapy. Persistent respiratory symptoms with worsening hypoxia due to underlying COPD exacerbation. According to hospital guidelines, patient with recurrent respiratory symptoms needing 10 L of oxygen, he will stay on airborne isolation while in the hospital.  While discharged home, he is out of infectious window.  3. AKI on CKD stage 3b: Prerenal.  Recent ultrasound negative.  Recurrent event. 1 L in isotonic saline given.  Will keep on maintenance fluids.  Intake and output monitoring.  Recheck in the morning.  4. Hypertension: Blood pressures are adequate.  We will continue amlodipine.  5. 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   DVT prophylaxis: heparin sq  Code Status: full code   Family Communication: wife on phone   Disposition Plan: home after clinical stabilization.  Consults called: none   Admission status: inpatient.    Patient has severe symptoms including increasing oxygen demand in the setting of underlying COPD and recent COVID-19 infection, he is currently on 10 L of oxygen that needs hospitalization and management.  Anticipate hospital stay more than 2 midnights.  Barb Merino MD [facility] Pager [phone]

## 2019-09-28 NOTE — Plan of Care (Signed)
Call bell and belongings are within reach. Pt will call for assistance when needing to get out of bed. Bed alarm set

## 2019-09-29 LAB — CBC WITH DIFFERENTIAL/PLATELET
Abs Immature Granulocytes: 0.64 10*3/uL — ABNORMAL HIGH (ref 0.00–0.07)
Basophils Absolute: 0 10*3/uL (ref 0.0–0.1)
Basophils Relative: 0 %
Eosinophils Absolute: 0 10*3/uL (ref 0.0–0.5)
Eosinophils Relative: 0 %
HCT: 35.7 % — ABNORMAL LOW (ref 39.0–52.0)
Hemoglobin: 10.4 g/dL — ABNORMAL LOW (ref 13.0–17.0)
Immature Granulocytes: 6 %
Lymphocytes Relative: 6 %
Lymphs Abs: 0.7 10*3/uL (ref 0.7–4.0)
MCH: 27.4 pg (ref 26.0–34.0)
MCHC: 29.1 g/dL — ABNORMAL LOW (ref 30.0–36.0)
MCV: 93.9 fL (ref 80.0–100.0)
Monocytes Absolute: 0.1 10*3/uL (ref 0.1–1.0)
Monocytes Relative: 1 %
Neutro Abs: 9.8 10*3/uL — ABNORMAL HIGH (ref 1.7–7.7)
Neutrophils Relative %: 87 %
Platelets: 516 10*3/uL — ABNORMAL HIGH (ref 150–400)
RBC: 3.8 MIL/uL — ABNORMAL LOW (ref 4.22–5.81)
RDW: 16.2 % — ABNORMAL HIGH (ref 11.5–15.5)
WBC: 11.3 10*3/uL — ABNORMAL HIGH (ref 4.0–10.5)
nRBC: 0.2 % (ref 0.0–0.2)

## 2019-09-29 LAB — COMPREHENSIVE METABOLIC PANEL
ALT: 20 U/L (ref 0–44)
AST: 25 U/L (ref 15–41)
Albumin: 2.9 g/dL — ABNORMAL LOW (ref 3.5–5.0)
Alkaline Phosphatase: 40 U/L (ref 38–126)
Anion gap: 13 (ref 5–15)
BUN: 27 mg/dL — ABNORMAL HIGH (ref 6–20)
CO2: 21 mmol/L — ABNORMAL LOW (ref 22–32)
Calcium: 9.2 mg/dL (ref 8.9–10.3)
Chloride: 105 mmol/L (ref 98–111)
Creatinine, Ser: 2.76 mg/dL — ABNORMAL HIGH (ref 0.61–1.24)
GFR calc Af Amer: 28 mL/min — ABNORMAL LOW (ref >60)
GFR calc non Af Amer: 24 mL/min — ABNORMAL LOW (ref >60)
Glucose, Bld: 194 mg/dL — ABNORMAL HIGH (ref 70–99)
Potassium: 5.1 mmol/L (ref 3.5–5.1)
Sodium: 139 mmol/L (ref 135–145)
Total Bilirubin: 0.5 mg/dL (ref 0.3–1.2)
Total Protein: 8.4 g/dL — ABNORMAL HIGH (ref 6.5–8.1)

## 2019-09-29 NOTE — Progress Notes (Signed)
PROGRESS NOTE  Mathew Lara KVQ:[id number] DOB: 04/25/1961 DOA: 09/28/2019 PCP: [name], MD  Brief History   Mathew Lara is a 59 y.o. male with medical history significant of recent COVID-19 pneumonia, 1223-09/02/2019 admit to the hospital and discharged on oxygen 2 L, COPD previously not using oxygen, recent admission for exacerbation 09/18/2019-09/20/2019 and discharged on oxygen, CKD stage IIIb with baseline creatinine about 1.5, GERD, hypertension, dyslipidemia, chronic pain syndrome on oxycodone presenting to the emergency department from home with low oxygen saturations at home, intermittent fever, intermittent cough and shortness of breath, poor appetite and not feeling well.  Patient is poor historian.  I reviewed his previous admissions as well as called his wife and discussed with her. According to patient's wife, he never felt to be better after initial episode of COVID-19 infection.  Since then he has intermittent cough, they think he might have fever but they did not measure it, patient states that he has no appetite.  The wife was concerned because he looked confused and very weak so she brought him to the ER. Patient denies any nausea vomiting.  Poor appetite present.  Denies any chest pain.  Patient himself denies any wheezing or shortness of breath on ambulation.  No abdominal pain.  No diarrhea.  Urine is normal.  No focal neurological deficits. ED Course: Tachypneic, hypoxic and put on 10 L oxygen and looks comfortable, tachycardic after nebs. CXR with b/l infiltrates slightly increased from before. WBC 14.4. Creatinine 3.12. Given steroids and magnesium in ER.  He has tested positive for COVID-19 on 08/28/2020.Marland Kitchen Procalcitonin is negative. Troponins are elevated. EKG is stable with no ischemic changes. Sinus tachycardia.    The patient has been admitted to a telemetry bed with airborne and contact precautions. He was empirically started on Rocephin and Azithromycin, high dose  IV steroids, and nebulizer treatments. As it does not appear that he has a bacterial pneumonia Inflammatory markers Mathew be followed.  Consultants  . None  Procedures  . None  Antibiotics   Anti-infectives (From admission, onward)   Start     Dose/Rate Route Frequency Ordered Stop   09/28/19 1800  azithromycin (ZITHROMAX) tablet 500 mg     500 mg Oral Daily 09/28/19 1635     09/28/19 1700  cefTRIAXone (ROCEPHIN) 2 g in sodium chloride 0.9 % 100 mL IVPB     2 g 200 mL/hr over 30 Minutes Intravenous Every 24 hours 09/28/19 1635      .  Subjective  The patient is talking to family on the phone. He displays no conversational dyspnea. No new complaints.  Objective   Vitals:  Vitals:   09/29/19 0738 09/29/19 1540  BP:  119/75  Pulse:  98  Resp:  16  Temp:  98.2 F (36.8 C)  SpO2: 98% 96%   Exam:  Constitutional:  . The patient is awake, alert, and oriented x 3. No acute distress. Respiratory:  . No increased work of breathing. . Diminished breath sounds bilaterally . No wheezes, rales, or rhonchi . No tactile fremitus Cardiovascular:  . Regular rate and rhythm . No murmurs, ectopy, or gallups. . No lateral PMI. No thrills. Abdomen:  . Abdomen is soft, non-tender, non-distended . No hernias, masses, or organomegaly . Normoactive bowel sounds.  Musculoskeletal:  . No cyanosis, clubbing, or edema Skin:  . No rashes, lesions, ulcers . palpation of skin: no induration or nodules Neurologic:  . CN 2-12 intact . Sensation all 4 extremities intact Psychiatric:  . Mental  status o Mood, affect appropriate o Orientation to person, place, time  . judgment and insight appear intact  I have personally reviewed the following:   Today's Data  . Vitals, BMP, Troponin, EKG, CBC, Procalcitonin  Micro Data  . COVID-19 positive on 08/28/2020  Imaging  . CXR  Cardiology Data  . EKG . Troponins  Scheduled Meds: . amLODipine  10 mg Oral Daily  . ascorbic acid  500  mg Oral Daily  . aspirin EC  81 mg Oral BID WC  . azithromycin  500 mg Oral Daily  . guaiFENesin  600 mg Oral BID  . heparin  5,000 Units Subcutaneous Q8H  . loratadine  10 mg Oral Daily  . methylPREDNISolone (SOLU-MEDROL) injection  60 mg Intravenous Q12H  . mometasone-formoterol  2 puff Inhalation BID  . senna-docusate  2 tablet Oral QHS  . zinc sulfate  220 mg Oral Daily   Continuous Infusions: . sodium chloride 100 mL/hr at 09/29/19 1622  . cefTRIAXone (ROCEPHIN)  IV 2 g (09/29/19 1624)    Principal Problem:   COPD with acute exacerbation (HCC) Active Problems:   Essential hypertension   CKD (chronic kidney disease), stage III   Acute renal failure (HCC)   Pneumonia due to COVID-19 virus   Chronic pain syndrome   Acute renal failure superimposed on stage 3b chronic kidney disease (HCC)   LOS: 1 day   A & P  COPD with acute exacerbation: with acute on chronic hypoxic failure:  Agree with admission to monitored unit because of severity of symptoms. Aggressive bronchodilator therapy, IV steroids, inhalational steroids, scheduled and as needed bronchodilators, deep breathing exercises, incentive spirometry, chest physiotherapy and respiratory therapy consult. Patient may have secondary infection.  Procalcitonin normal.  Antibiotics Mathew be stopped. Patient is nontoxic looking. Supplemental oxygen to keep saturations more than 89%.  Recent COVID19 infection with persistent respiratory symptoms: No indication for antiviral therapy. Persistent respiratory symptoms with worsening hypoxia due to underlying COPD exacerbation. According to hospital guidelines, patient with recurrent respiratory symptoms needing 10 L of oxygen, he Mathew stay on airborne isolation while in the hospital.  While discharged home, he is out of infectious window.  AKI on CKD stage 3b: Prerenal.  Recent ultrasound negative.  Recurrent event. 1 L in isotonic saline given.  Mathew keep on maintenance fluids.   Intake and output monitoring. Creatinine has declined from 3.12 on admission to 2.76 this morning. Avoid nephrotoxic substances and hypotension. Monitor creatinine, electrolytes, and volume status.  Hypertension: Blood pressures are under fair control. Continue amlodipine as outpatient.  5. 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 It Mathew be continued.  I have seen and examined this patient myself I have spent 32 minutes in his evaluation and care.  DVT prophylaxis: heparin sq  Code Status: full code   Family Communication: non available.  Disposition Plan: home after clinical stabilization.    Keala Drum, DO [facility] Direct contact: see [email or web address]  7PM-7AM contact night coverage as above 09/29/2019, 5:25 PM  LOS: 1 day

## 2019-09-30 LAB — CBC WITH DIFFERENTIAL/PLATELET
Abs Immature Granulocytes: 0.86 10*3/uL — ABNORMAL HIGH (ref 0.00–0.07)
Basophils Absolute: 0 10*3/uL (ref 0.0–0.1)
Basophils Relative: 0 %
Eosinophils Absolute: 0 10*3/uL (ref 0.0–0.5)
Eosinophils Relative: 0 %
HCT: 34.9 % — ABNORMAL LOW (ref 39.0–52.0)
Hemoglobin: 10.1 g/dL — ABNORMAL LOW (ref 13.0–17.0)
Immature Granulocytes: 4 %
Lymphocytes Relative: 3 %
Lymphs Abs: 0.6 10*3/uL — ABNORMAL LOW (ref 0.7–4.0)
MCH: 27.3 pg (ref 26.0–34.0)
MCHC: 28.9 g/dL — ABNORMAL LOW (ref 30.0–36.0)
MCV: 94.3 fL (ref 80.0–100.0)
Monocytes Absolute: 0.5 10*3/uL (ref 0.1–1.0)
Monocytes Relative: 3 %
Neutro Abs: 19.2 10*3/uL — ABNORMAL HIGH (ref 1.7–7.7)
Neutrophils Relative %: 90 %
Platelets: 537 10*3/uL — ABNORMAL HIGH (ref 150–400)
RBC: 3.7 MIL/uL — ABNORMAL LOW (ref 4.22–5.81)
RDW: 15.9 % — ABNORMAL HIGH (ref 11.5–15.5)
WBC: 21.2 10*3/uL — ABNORMAL HIGH (ref 4.0–10.5)
nRBC: 0.1 % (ref 0.0–0.2)

## 2019-09-30 LAB — COMPREHENSIVE METABOLIC PANEL
ALT: 21 U/L (ref 0–44)
AST: 28 U/L (ref 15–41)
Albumin: 2.7 g/dL — ABNORMAL LOW (ref 3.5–5.0)
Alkaline Phosphatase: 50 U/L (ref 38–126)
Anion gap: 8 (ref 5–15)
BUN: 30 mg/dL — ABNORMAL HIGH (ref 6–20)
CO2: 25 mmol/L (ref 22–32)
Calcium: 9.1 mg/dL (ref 8.9–10.3)
Chloride: 108 mmol/L (ref 98–111)
Creatinine, Ser: 1.87 mg/dL — ABNORMAL HIGH (ref 0.61–1.24)
GFR calc Af Amer: 44 mL/min — ABNORMAL LOW (ref >60)
GFR calc non Af Amer: 38 mL/min — ABNORMAL LOW (ref >60)
Glucose, Bld: 222 mg/dL — ABNORMAL HIGH (ref 70–99)
Potassium: 4.9 mmol/L (ref 3.5–5.1)
Sodium: 141 mmol/L (ref 135–145)
Total Bilirubin: 0.3 mg/dL (ref 0.3–1.2)
Total Protein: 7.8 g/dL (ref 6.5–8.1)

## 2019-09-30 NOTE — Progress Notes (Signed)
PROGRESS NOTE  Mathew Lara QQV:[id number] DOB: 1961-08-11 DOA: 09/28/2019 PCP: [name], MD  Brief History   Mathew Lara is a 59 y.o. male with medical history significant of recent COVID-19 pneumonia, 1223-09/02/2019 admit to the hospital and discharged on oxygen 2 L, COPD previously not using oxygen, recent admission for exacerbation 09/18/2019-09/20/2019 and discharged on oxygen, CKD stage IIIb with baseline creatinine about 1.5, GERD, hypertension, dyslipidemia, chronic pain syndrome on oxycodone presenting to the emergency department from home with low oxygen saturations at home, intermittent fever, intermittent cough and shortness of breath, poor appetite and not feeling well.  Patient is poor historian.  I reviewed his previous admissions as well as called his wife and discussed with her. According to patient's wife, he never felt to be better after initial episode of COVID-19 infection.  Since then he has intermittent cough, they think he might have fever but they did not measure it, patient states that he has no appetite.  The wife was concerned because he looked confused and very weak so she brought him to the ER. Patient denies any nausea vomiting.  Poor appetite present.  Denies any chest pain.  Patient himself denies any wheezing or shortness of breath on ambulation.  No abdominal pain.  No diarrhea.  Urine is normal.  No focal neurological deficits. In the ED the patient is tachypneic, hypoxic and put on 10 L oxygen and looks comfortable, tachycardic after nebs. CXR with b/l infiltrates slightly increased from before. WBC 14.4. Creatinine 3.12. Given steroids and magnesium in ER.  He has tested positive for COVID-19 on 08/28/2020.Marland Kitchen Procalcitonin is negative. Troponins are elevated. EKG is stable with no ischemic changes. Sinus tachycardia.    The patient has been admitted to a telemetry bed with airborne and contact precautions. He was empirically started on Rocephin and  Azithromycin, high dose IV steroids, and nebulizer treatments. As it does not appear that he has a bacterial pneumonia antibiotics have been discontinued. Inflammatory markers will be followed.  Consultants  . None  Procedures  . None  Antibiotics   Anti-infectives (From admission, onward)   Start     Dose/Rate Route Frequency Ordered Stop   09/28/19 1800  azithromycin (ZITHROMAX) tablet 500 mg     500 mg Oral Daily 09/28/19 1635     09/28/19 1700  cefTRIAXone (ROCEPHIN) 2 g in sodium chloride 0.9 % 100 mL IVPB     2 g 200 mL/hr over 30 Minutes Intravenous Every 24 hours 09/28/19 1635       Subjective  The patient has some mild conversational dyspnea as he speaks to his wife on the phone. No new complaints.  Objective   Vitals:  Vitals:   09/30/19 1223 09/30/19 1340  BP:    Pulse:    Resp:    Temp:    SpO2: 95% 94%   Exam:  Constitutional:  . The patient is awake, alert, and oriented x 3. No acute distress. Respiratory:  . No increased work of breathing. . Diminished breath sounds bilaterally . No wheezes, rales, or rhonchi . No tactile fremitus Cardiovascular:  . Regular rate and rhythm . No murmurs, ectopy, or gallups. . No lateral PMI. No thrills. Abdomen:  . Abdomen is soft, non-tender, non-distended . No hernias, masses, or organomegaly . Normoactive bowel sounds.  Musculoskeletal:  . No cyanosis, clubbing, or edema Skin:  . No rashes, lesions, ulcers . palpation of skin: no induration or nodules Neurologic:  . CN 2-12 intact . Sensation all 4  extremities intact Psychiatric:  . Mental status o Mood, affect appropriate o Orientation to person, place, time  . judgment and insight appear intact  I have personally reviewed the following:   Today's Data  . Vitals, BMP, Troponin, EKG, CBC, Procalcitonin  Micro Data  . COVID-19 positive on 08/28/2020  Imaging  . CXR  Cardiology Data  . EKG . Troponins  Scheduled Meds: . amLODipine  10 mg  Oral Daily  . ascorbic acid  500 mg Oral Daily  . aspirin EC  81 mg Oral BID WC  . azithromycin  500 mg Oral Daily  . guaiFENesin  600 mg Oral BID  . heparin  5,000 Units Subcutaneous Q8H  . loratadine  10 mg Oral Daily  . methylPREDNISolone (SOLU-MEDROL) injection  60 mg Intravenous Q12H  . mometasone-formoterol  2 puff Inhalation BID  . senna-docusate  2 tablet Oral QHS  . zinc sulfate  220 mg Oral Daily   Continuous Infusions: . sodium chloride 100 mL/hr at 09/30/19 1223  . cefTRIAXone (ROCEPHIN)  IV Stopped (09/29/19 1654)    Principal Problem:   COPD with acute exacerbation (HCC) Active Problems:   Essential hypertension   CKD (chronic kidney disease), stage III   Acute renal failure (HCC)   Pneumonia due to COVID-19 virus   Chronic pain syndrome   Acute renal failure superimposed on stage 3b chronic kidney disease (HCC)   LOS: 2 days   A & P  COPD with acute exacerbation: with acute on chronic hypoxic failure:  Agree with admission to monitored unit because of severity of symptoms. Aggressive bronchodilator therapy, IV steroids, inhalational steroids, scheduled and as needed bronchodilators, deep breathing exercises, incentive spirometry, chest physiotherapy and respiratory therapy consult. Patient may have secondary infection.  Procalcitonin normal.  Antibiotics will be stopped. Patient is nontoxic looking. Supplemental oxygen to keep saturations more than 89%. He is currently requiring 10 L to maintain 98% on room air.  Recent COVID19 infection with persistent respiratory symptoms: No indication for antiviral therapy. Persistent respiratory symptoms with worsening hypoxia due to underlying COPD exacerbation. According to hospital guidelines, patient with recurrent respiratory symptoms needing 10 L of oxygen, he will stay on airborne isolation while in the hospital.  While discharged home, he is out of infectious window.  AKI on CKD stage 3b: Prerenal.  Recent ultrasound  negative.  Recurrent event. 1 L in isotonic saline given.  Will keep on maintenance fluids.  Intake and output monitoring. Creatinine has declined from 3.12 on admission to 1.87 this morning. Avoid nephrotoxic substances and hypotension. Monitor creatinine, electrolytes, and volume status.  Hypertension: Blood pressures are under fair control. Continue amlodipine as outpatient.  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 It will be continued.  I have seen and examined this patient myself I have spent 34 minutes in his evaluation and care.  DVT prophylaxis: heparin sq  Code Status: full code   Family Communication: I have discussed the patient with his wife over the phone. All questions answered to the best of my ability. Disposition Plan: home after clinical stabilization.   Amedio Bowlby, DO [facility] Direct contact: see [email or web address]  7PM-7AM contact night coverage as above 09/30/2019, 1:55 PM  LOS: 1 day

## 2019-09-30 NOTE — Plan of Care (Signed)
Patient tolerating heart healthy diet, up to chair for meals.  Patient able to tolerate weaning of oxygen from 10 liters down to 5 liters on 7 a to 7 p shift.  This RN updated wife and daughter over the phone.

## 2019-10-01 DIAGNOSIS — G894 Chronic pain syndrome: Secondary | ICD-10-CM

## 2019-10-01 DIAGNOSIS — U071 COVID-19: Secondary | ICD-10-CM

## 2019-10-01 DIAGNOSIS — I10 Essential (primary) hypertension: Secondary | ICD-10-CM

## 2019-10-01 DIAGNOSIS — N179 Acute kidney failure, unspecified: Secondary | ICD-10-CM

## 2019-10-01 DIAGNOSIS — N1832 Chronic kidney disease, stage 3b: Secondary | ICD-10-CM

## 2019-10-01 LAB — COMPREHENSIVE METABOLIC PANEL
ALT: 24 U/L (ref 0–44)
AST: 29 U/L (ref 15–41)
Albumin: 2.9 g/dL — ABNORMAL LOW (ref 3.5–5.0)
Alkaline Phosphatase: 45 U/L (ref 38–126)
Anion gap: 9 (ref 5–15)
BUN: 27 mg/dL — ABNORMAL HIGH (ref 6–20)
CO2: 27 mmol/L (ref 22–32)
Calcium: 9.2 mg/dL (ref 8.9–10.3)
Chloride: 104 mmol/L (ref 98–111)
Creatinine, Ser: 1.67 mg/dL — ABNORMAL HIGH (ref 0.61–1.24)
GFR calc Af Amer: 51 mL/min — ABNORMAL LOW (ref >60)
GFR calc non Af Amer: 44 mL/min — ABNORMAL LOW (ref >60)
Glucose, Bld: 219 mg/dL — ABNORMAL HIGH (ref 70–99)
Potassium: 4.5 mmol/L (ref 3.5–5.1)
Sodium: 140 mmol/L (ref 135–145)
Total Bilirubin: 0.9 mg/dL (ref 0.3–1.2)
Total Protein: 8 g/dL (ref 6.5–8.1)

## 2019-10-01 LAB — CBC WITH DIFFERENTIAL/PLATELET
Abs Immature Granulocytes: 1.42 10*3/uL — ABNORMAL HIGH (ref 0.00–0.07)
Basophils Absolute: 0 10*3/uL (ref 0.0–0.1)
Basophils Relative: 0 %
Eosinophils Absolute: 0 10*3/uL (ref 0.0–0.5)
Eosinophils Relative: 0 %
HCT: 35 % — ABNORMAL LOW (ref 39.0–52.0)
Hemoglobin: 10.6 g/dL — ABNORMAL LOW (ref 13.0–17.0)
Immature Granulocytes: 6 %
Lymphocytes Relative: 3 %
Lymphs Abs: 0.7 10*3/uL (ref 0.7–4.0)
MCH: 28 pg (ref 26.0–34.0)
MCHC: 30.3 g/dL (ref 30.0–36.0)
MCV: 92.3 fL (ref 80.0–100.0)
Monocytes Absolute: 0.5 10*3/uL (ref 0.1–1.0)
Monocytes Relative: 2 %
Neutro Abs: 22.2 10*3/uL — ABNORMAL HIGH (ref 1.7–7.7)
Neutrophils Relative %: 89 %
Platelets: 535 10*3/uL — ABNORMAL HIGH (ref 150–400)
RBC: 3.79 MIL/uL — ABNORMAL LOW (ref 4.22–5.81)
RDW: 15.9 % — ABNORMAL HIGH (ref 11.5–15.5)
WBC: 24.9 10*3/uL — ABNORMAL HIGH (ref 4.0–10.5)
nRBC: 0.2 % (ref 0.0–0.2)

## 2019-10-01 MED ORDER — IPRATROPIUM-ALBUTEROL 20-100 MCG/ACT IN AERS
1.0000 | INHALATION_SPRAY | Freq: Four times a day (QID) | RESPIRATORY_TRACT | Status: DC
  Administered 2019-10-01 – 2019-10-02 (×4): 1 via RESPIRATORY_TRACT
  Filled 2019-10-01: qty 4

## 2019-10-01 MED ORDER — ALBUTEROL SULFATE HFA 108 (90 BASE) MCG/ACT IN AERS: 1.0000 | INHALATION_SPRAY | RESPIRATORY_TRACT | Status: DC | PRN

## 2019-10-01 MED ORDER — CALCIUM CARBONATE ANTACID 500 MG PO CHEW
400.0000 mg | CHEWABLE_TABLET | Freq: Once | ORAL | Status: AC
  Administered 2019-10-01: 400 mg via ORAL
  Filled 2019-10-01: qty 2

## 2019-10-01 NOTE — Progress Notes (Signed)
PROGRESS NOTE    Mathew Lara  DJS:[id number]  DOB: 10-19-1960  PCP: [name], MD Admit date:09/28/2019  59 year old male with history of COPD, CKD stage IIIb, GERD, hypertension, hyperlipidemia, chronic pain syndrome on oxycodone who was recently admitted to this [facility] from 12/23 to 09/02/2019 for COVID-19 pneumonia and discharged home on 2 L O2 and recent COPD exacerbation 1/12 to 09/20/2019 presented to ED on 09/28/2019 with complaints of malaise, poor appetite, cough, shortness of breath, intermittent fevers and low O2 sats in spite of using home O2.  There was also concern for altered mental status as observed by wife. ED Course: Afebrile, tachypneic, hypoxic and put on 10 L oxygen,tachycardic after nebs.CXR with b/l infiltrates slightly increased from before. WBC 14.4. Creatinine 3.12.   Received steroids and magnesium in ER.  Procalcitonin negative.  Troponin slightly elevated.  EKG sinus tach with no ischemic changes. Hospital course: Patient admitted to [facility] for further evaluation and management. He was empirically started on Rocephin and Azithromycin, high dose IV steroids, and nebulizer treatments. As it does not appear that he has a bacterial pneumonia antibiotics have been discontinued. Inflammatory markers are being followed while patient hospitalized here.  Subjective:  Patient feels better today and O2 requirement drastically down to 4 L.  Objective: Vitals:   10/01/19 0458 10/01/19 0808 10/01/19 1017 10/01/19 1441  BP: 130/88  (!) 134/99 (!) 123/92  Pulse: (!) 104  (!) 110 (!) 105  Resp: 20  20 20   Temp: 98.1 F (36.7 C)   (!) 97.4 F (36.3 C)  TempSrc: Oral   Oral  SpO2: 97% 96% 96% 94%  Weight:      Height:        Intake/Output Summary (Last 24 hours) at 10/01/2019 1529 Last data filed at 10/01/2019 1407 Gross per 24 hour  Intake 2684.81 ml  Output 3000 ml  Net -315.19 ml   Filed Weights   09/28/19 1212 09/28/19 1820  Weight: 117 kg 118.3 kg     Physical Examination:  General exam: Appears calm and comfortable  Respiratory system: Clear to auscultation. Respiratory effort normal. Cardiovascular system: S1 & S2 heard, RRR. No JVD, murmurs, rubs, gallops or clicks. No pedal edema. Gastrointestinal system: Abdomen is nondistended, soft and nontender. Normal bowel sounds heard. Central nervous system: Alert and oriented. No new focal neurological deficits. Extremities: No contractures, edema or joint deformities.  Skin: No rashes, lesions or ulcers Psychiatry: Judgement and insight appear normal. Mood & affect appropriate.   Data Reviewed: I have personally reviewed following labs and imaging studies  CBC: Recent Labs  Lab 09/28/19 1237 09/29/19 0316 09/30/19 0340 10/01/19 0349  WBC 14.4* 11.3* 21.2* 24.9*  NEUTROABS 10.8* 9.8* 19.2* 22.2*  HGB 12.6* 10.4* 10.1* 10.6*  HCT 42.9 35.7* 34.9* 35.0*  MCV 93.3 93.9 94.3 92.3  PLT 745* 516* 537* 885*   Basic Metabolic Panel: Recent Labs  Lab 09/28/19 1237 09/29/19 0316 09/30/19 0340 10/01/19 0349  NA 138 139 141 140  K 4.5 5.1 4.9 4.5  CL 102 105 108 104  CO2 23 21* 25 27  GLUCOSE 146* 194* 222* 219*  BUN 19 27* 30* 27*  CREATININE 3.12* 2.76* 1.87* 1.67*  CALCIUM 9.4 9.2 9.1 9.2   GFR: Estimated Creatinine Clearance: 58.8 mL/min (A) (by C-G formula based on SCr of 1.67 mg/dL (H)). Liver Function Tests: Recent Labs  Lab 09/29/19 0316 09/30/19 0340 10/01/19 0349  AST 25 28 29   ALT 20 21 24   ALKPHOS 40 50  45  BILITOT 0.5 0.3 0.9  PROT 8.4* 7.8 8.0  ALBUMIN 2.9* 2.7* 2.9*   No results for input(s): LIPASE, AMYLASE in the last 168 hours. No results for input(s): AMMONIA in the last 168 hours. Coagulation Profile: No results for input(s): INR, PROTIME in the last 168 hours. Cardiac Enzymes: No results for input(s): CKTOTAL, CKMB, CKMBINDEX, TROPONINI in the last 168 hours. BNP (last 3 results) No results for input(s): PROBNP in the last 8760  hours. HbA1C: No results for input(s): HGBA1C in the last 72 hours. CBG: No results for input(s): GLUCAP in the last 168 hours. Lipid Profile: No results for input(s): CHOL, HDL, LDLCALC, TRIG, CHOLHDL, LDLDIRECT in the last 72 hours. Thyroid Function Tests: No results for input(s): TSH, T4TOTAL, FREET4, T3FREE, THYROIDAB in the last 72 hours. Anemia Panel: No results for input(s): VITAMINB12, FOLATE, FERRITIN, TIBC, IRON, RETICCTPCT in the last 72 hours. Sepsis Labs: Recent Labs  Lab 09/28/19 1237  PROCALCITON 0.11    No results found for this or any previous visit (from the past 240 hour(s)).    Radiology Studies: No results found.      Scheduled Meds: . amLODipine  10 mg Oral Daily  . ascorbic acid  500 mg Oral Daily  . aspirin EC  81 mg Oral BID WC  . guaiFENesin  600 mg Oral BID  . heparin  5,000 Units Subcutaneous Q8H  . loratadine  10 mg Oral Daily  . methylPREDNISolone (SOLU-MEDROL) injection  60 mg Intravenous Q12H  . mometasone-formoterol  2 puff Inhalation BID  . senna-docusate  2 tablet Oral QHS  . zinc sulfate  220 mg Oral Daily   Continuous Infusions: . sodium chloride 100 mL/hr at 10/01/19 0805    Assessment & Plan:  1.  Acute on chronic hypoxic respiratory failure secondary to COPD exacerbation and recent COVID-19 infection: Patient has been requiring high flow O2 up to 10 L until this morning but has been tapered down to 4 L now.  If he is able to sustain O2 sats greater than 90% can likely be transitioned home with increased home O2 recommendations. Continue Solu-Medrol at the current dose for today.  He has remained on airborne precautions given high O2 requirements although would be out of infectious window (21 days) at discharge.Last D-dimer check was on 1/14 (6.32.).Given improvement in O2 requirement, will defer contrasted CT studies for alternate diagnoses given concern for problem #2. Continue albuterol inhaler (changed to Combivent) and vitamin  C/zinc supplements.  2. AKI on CKD stage IIIb: Patient had elevated creatinine with peak at 3.12, responded to IV fluids and creatinine has now down trended to 1.8. Will taper IV fluids to off as oral intake improves. Avoid nephrotoxins, ACE inhibitors and diuretics.  3. Hypertension: Continue Norvasc.  4. Chronic pain syndrome: Patient has history of avascular necrosis of hip and uses oxycodone at home.  5. Hyperlipidemia/GERD: Resume home medications  DVT prophylaxis: Heparin Code Status: Full code Family / Patient Communication: Discussed with patient in detail and answered all questions to satisfaction. Disposition Plan: Home in next 24 to 48 hours if O2 requirements remain improved and stable.     LOS: 3 days    Time spent: 35 minutes    Guilford Shi, MD [facility] Pager [phone]  If 7PM-7AM, please contact night-coverage [email or web address] Password [facility] 10/01/2019, 3:29 PM

## 2019-10-01 NOTE — Progress Notes (Signed)
   10/01/19 0458  Vitals  Temp 98.1 F (36.7 C)  Temp Source Oral  BP 130/88  MAP (mmHg) 102  BP Location Left Arm  BP Method Automatic  Patient Position (if appropriate) Lying  Pulse Rate (!) 104  Pulse Rate Source Monitor  Resp 20  Oxygen Therapy  SpO2 97 %  O2 Device Nasal Cannula  O2 Flow Rate (L/min) 5 L/min  MEWS Score  MEWS Temp 0  MEWS Systolic 0  MEWS Pulse 1  MEWS RR 0  MEWS LOC 0  MEWS Score 1  MEWS Score Color Green  MEWS Assessment  Is this an acute change? No  Provider Notification  Provider Name/Title Bodenheimer  Date Provider Notified 10/01/19  Time Provider Notified [phone]  Notification Type Page  Notification Reason Other (Comment) (High HR)   MD made aware

## 2019-10-02 DIAGNOSIS — J9621 Acute and chronic respiratory failure with hypoxia: Secondary | ICD-10-CM

## 2019-10-02 DIAGNOSIS — K219 Gastro-esophageal reflux disease without esophagitis: Secondary | ICD-10-CM

## 2019-10-02 DIAGNOSIS — J849 Interstitial pulmonary disease, unspecified: Secondary | ICD-10-CM

## 2019-10-02 LAB — CBC WITH DIFFERENTIAL/PLATELET
Abs Immature Granulocytes: 1.72 10*3/uL — ABNORMAL HIGH (ref 0.00–0.07)
Basophils Absolute: 0.1 10*3/uL (ref 0.0–0.1)
Basophils Relative: 0 %
Eosinophils Absolute: 0 10*3/uL (ref 0.0–0.5)
Eosinophils Relative: 0 %
HCT: 36.9 % — ABNORMAL LOW (ref 39.0–52.0)
Hemoglobin: 11.2 g/dL — ABNORMAL LOW (ref 13.0–17.0)
Immature Granulocytes: 7 %
Lymphocytes Relative: 4 %
Lymphs Abs: 0.9 10*3/uL (ref 0.7–4.0)
MCH: 27.6 pg (ref 26.0–34.0)
MCHC: 30.4 g/dL (ref 30.0–36.0)
MCV: 90.9 fL (ref 80.0–100.0)
Monocytes Absolute: 0.6 10*3/uL (ref 0.1–1.0)
Monocytes Relative: 3 %
Neutro Abs: 20.4 10*3/uL — ABNORMAL HIGH (ref 1.7–7.7)
Neutrophils Relative %: 86 %
Platelets: 531 10*3/uL — ABNORMAL HIGH (ref 150–400)
RBC: 4.06 MIL/uL — ABNORMAL LOW (ref 4.22–5.81)
RDW: 16 % — ABNORMAL HIGH (ref 11.5–15.5)
WBC: 23.7 10*3/uL — ABNORMAL HIGH (ref 4.0–10.5)
nRBC: 0.3 % — ABNORMAL HIGH (ref 0.0–0.2)

## 2019-10-02 LAB — COMPREHENSIVE METABOLIC PANEL
ALT: 34 U/L (ref 0–44)
AST: 37 U/L (ref 15–41)
Albumin: 2.8 g/dL — ABNORMAL LOW (ref 3.5–5.0)
Alkaline Phosphatase: 54 U/L (ref 38–126)
Anion gap: 11 (ref 5–15)
BUN: 27 mg/dL — ABNORMAL HIGH (ref 6–20)
CO2: 26 mmol/L (ref 22–32)
Calcium: 9.3 mg/dL (ref 8.9–10.3)
Chloride: 102 mmol/L (ref 98–111)
Creatinine, Ser: 1.49 mg/dL — ABNORMAL HIGH (ref 0.61–1.24)
GFR calc Af Amer: 58 mL/min — ABNORMAL LOW (ref >60)
GFR calc non Af Amer: 50 mL/min — ABNORMAL LOW (ref >60)
Glucose, Bld: 248 mg/dL — ABNORMAL HIGH (ref 70–99)
Potassium: 4.1 mmol/L (ref 3.5–5.1)
Sodium: 139 mmol/L (ref 135–145)
Total Bilirubin: 0.4 mg/dL (ref 0.3–1.2)
Total Protein: 7.4 g/dL (ref 6.5–8.1)

## 2019-10-02 LAB — D-DIMER, QUANTITATIVE: D-Dimer, Quant: 0.96 ug/mL-FEU — ABNORMAL HIGH (ref 0.00–0.50)

## 2019-10-02 MED ORDER — METOPROLOL TARTRATE 25 MG PO TABS: 12.5000 mg | ORAL_TABLET | Freq: Two times a day (BID) | ORAL | 1 refills | Status: DC

## 2019-10-02 MED ORDER — IPRATROPIUM-ALBUTEROL 20-100 MCG/ACT IN AERS: 1.0000 | INHALATION_SPRAY | Freq: Four times a day (QID) | RESPIRATORY_TRACT | 1 refills | Status: DC

## 2019-10-02 MED ORDER — PREDNISONE 20 MG PO TABS: ORAL_TABLET | ORAL | 0 refills | Status: DC

## 2019-10-02 MED ORDER — PROAIR HFA 108 (90 BASE) MCG/ACT IN AERS: 2.0000 | INHALATION_SPRAY | RESPIRATORY_TRACT | 0 refills | Status: DC | PRN

## 2019-10-02 MED ORDER — METOPROLOL TARTRATE 25 MG PO TABS
12.5000 mg | ORAL_TABLET | Freq: Two times a day (BID) | ORAL | Status: DC
  Administered 2019-10-02: 12.5 mg via ORAL
  Filled 2019-10-02: qty 1

## 2019-10-02 NOTE — Discharge Summary (Addendum)
Physician Discharge Summary  Mathew Lara ENI:[id number] DOB: [birth year] DOA: 09/28/2019  PCP: [name], MD  Admit date: 09/28/2019 Discharge date: 10/02/2019 Consultations: none Admitted From: home Disposition: home  Discharge Diagnoses:  Principal Problem:   Acute on chronic respiratory failure with hypoxia ([place]) Active Problems:   COPD with acute exacerbation ([place])   Acute renal failure superimposed on stage 3b chronic kidney disease ([place])   ILD (interstitial lung disease) ([place])   Essential hypertension   COVID-19 virus infection   Chronic pain syndrome   GERD (gastroesophageal reflux disease)  Hospital Course Summary: 59 year old male with history of COPD, CKD stage IIIb, GERD, hypertension, hyperlipidemia, chronic pain syndrome on oxycodone who was recently admitted to this [facility] from 12/23 to 09/02/2019 for COVID-19 pneumonia and discharged home on 2 L O2 and recent COPD exacerbation 1/12 to 09/20/2019 presented to ED on 09/28/2019 with complaints of malaise, poor appetite, cough, shortness of breath, intermittent fevers and low O2 sats in spite of using home O2. There was also concern for altered mental status as observed by wife. ED Course: Afebrile, tachypneic, hypoxic and put on 10 L oxygen,tachycardic after nebs.CXR with reported chronic lung disease with stable to slightly increased interstitial prominence. WBC 14.4. Creatinine 3.12.  Received steroids and magnesium in ER.  Procalcitonin came back negative. Troponin slightly elevated.  EKG sinus tach with no ischemic changes. Hospital course: Patient admitted to [facility] for further evaluation and management. He was started on high dose IV steroids, empirically started on Rocephin and Azithromycin and nebulizer treatments. As procalcitonin level resulted unremarkable, bacterial pneumoniawas felt to be unlikely and antibiotics discontinued.  1.  Acute on chronic hypoxic respiratory failure secondary to ILD/COPD  exacerbation and with recent COVID-19 infection: Patient managed with high dose IV steroids, was requiring high flow O2 -10 L until 11/25 morning then tapered down to 4 L via [place]--today further down to 2lits. Since he is able to sustain O2 sats greater than 90%, he is planned for discharge home with 7 day prednisone taper-should follow up with Pulmonary as outpatient for PFTs as he may have underlying ILD vs post COVID scarring ( His X rays from 2019 also mention linear scarring at bases). He remained on airborne precautions given high O2 requirements although would be out of infectious window (21 days) at discharge.Last D-dimer check was on 1/14 (6.32.)--repeated level today which is normalized at 0.9.Given improvement in O2 requirement,improvement in inflammatory markers deferred contrasted CT studies for alternate diagnoses especially in the setting of  problem #2. Continue MDI (albuterol changed to Combivent) and vitamin C/zinc supplements.  2. AKI on CKD stage IIIb: Patient had elevated creatinine with peak at 3.12, responded to IV fluids and creatinine has now down trended to 1.8-->today at 1.4 which is his baseline. Tapered IV fluids to off as oral intake improved. Avoid nephrotoxins, ACE inhibitors and diuretics.  3. Hypertension: Continue Norvasc. His diastolic BP has been somewhat elevated and also tachycardic on exertion, will add low dose b-blocker ( cannot add ca channel AV blockers as already on amlodipine)  4. Chronic pain syndrome: Patient has history of avascular necrosis of hip and uses oxycodone at home.  5. Hyperlipidemia/GERD: Resume home medications (unclear if he is still supposed to be taking fenofibrate/PPI)    Discharge Exam:  Vitals:   10/02/19 0435 10/02/19 0900  BP: (!) 137/98 (!) 138/111  Pulse: 100 (!) 115  Resp: 20 20  Temp: (!) 97.5 F (36.4 C) 98.2 F (36.8 C)  SpO2: 97%  94%   Vitals:   10/01/19 2005 10/01/19 2015 10/02/19 0435 10/02/19 0900  BP: (!)  140/103  (!) 137/98 (!) 138/111  Pulse: 100  100 (!) 115  Resp: 20  20 20   Temp: (!) 97.5 F (36.4 C)  (!) 97.5 F (36.4 C) 98.2 F (36.8 C)  TempSrc: Oral  Oral Oral  SpO2: 95% 94% 97% 94%  Weight:      Height:        General: Pt is alert, awake, not in acute distress Cardiovascular: RRR, S1/S2 +, no rubs, no gallops Respiratory: CTA bilaterally, no wheezing, no rhonchi Abdominal: Soft, NT, ND, bowel sounds + Extremities: no edema, no cyanosis  Discharge Condition:Stable CODE STATUS: Full code Diet recommendation: low salt , heart healthy diet Recommendations for Outpatient Follow-up:  1. Follow up with PCP: 1 week 2. Follow up with consultants: Pulmonary clinic for PFTs (ambulatory referral placed) 3. Please obtain follow up labs including: [facility] services upon discharge: no Equipment/Devices upon discharge: resume home 02 2-3 lits   Discharge Instructions:  Discharge Instructions    Call MD for:  difficulty breathing, headache or visual disturbances   Complete by: As directed    Call MD for:  persistant dizziness or light-headedness   Complete by: As directed    Call MD for:  temperature >100.4   Complete by: As directed    Diet - low sodium heart healthy   Complete by: As directed    Increase activity slowly   Complete by: As directed      Allergies as of 10/02/2019   No Known Allergies     Medication List    STOP taking these medications   fenofibrate 160 MG tablet   guaiFENesin 600 MG 12 hr tablet Commonly known as: Mucinex     TAKE these medications   acetaminophen 325 MG tablet Commonly known as: TYLENOL Take 2 tablets (650 mg total) by mouth every 6 (six) hours as needed for mild pain or headache (fever >/= 101).   ALPRAZolam 0.5 MG tablet Commonly known as: XANAX Take 1 tablet (0.5 mg total) by mouth every 12 (twelve) hours as needed for anxiety or sleep.   amLODipine 10 MG tablet Commonly known as: NORVASC Take 10 mg by mouth  daily.   ascorbic acid 500 MG tablet Commonly known as: VITAMIN C Take 1 tablet (500 mg total) by mouth daily.   aspirin EC 81 MG tablet Take 1 tablet (81 mg total) by mouth 2 (two) times daily with a meal.   budesonide-formoterol 160-4.5 MCG/ACT inhaler Commonly known as: SYMBICORT Inhale 2 puffs into the lungs 2 (two) times daily.   guaiFENesin-codeine 100-10 MG/5ML syrup Take 10 mLs by mouth every 6 (six) hours as needed for cough.   Ipratropium-Albuterol 20-100 MCG/ACT Aers respimat Commonly known as: COMBIVENT Inhale 1 puff into the lungs every 6 (six) hours.   loratadine 10 MG tablet Commonly known as: CLARITIN Take 1 tablet (10 mg total) by mouth daily.   metoprolol tartrate 25 MG tablet Commonly known as: LOPRESSOR Take 0.5 tablets (12.5 mg total) by mouth 2 (two) times daily.   ondansetron 4 MG tablet Commonly known as: ZOFRAN Take 4-8 mg by mouth every 8 (eight) hours as needed for nausea or vomiting.   oxyCODONE 5 MG immediate release tablet Commonly known as: Oxy IR/ROXICODONE Take 1-2 tablets (5-10 mg total) by mouth every 6 (six) hours as needed for moderate pain (pain score 4-6).   pantoprazole  40 MG tablet Commonly known as: Protonix Take 1 tablet (40 mg total) by mouth daily.   predniSONE 20 MG tablet Commonly known as: Deltasone Take 2 tabs (40mg ) x 3 days , then 1 tab (20mg ) x 3 days then 10 mg ( 1/2 tab) x 2 days , then stop   ProAir HFA 108 (90 Base) MCG/ACT inhaler Generic drug: albuterol Inhale 2 puffs into the lungs every 4 (four) hours as needed for wheezing or shortness of breath. What changed:   when to take this  reasons to take this   senna-docusate 8.6-50 MG tablet Commonly known as: Senokot-S Take 2 tablets by mouth at bedtime.   zinc sulfate 220 (50 Zn) MG capsule Take 1 capsule (220 mg total) by mouth daily.       No Known Allergies    The results of significant diagnostics from this hospitalization (including  imaging, microbiology, ancillary and laboratory) are listed below for reference.    Labs: BNP (last 3 results) Recent Labs    09/28/19 1237  BNP 88.4   Basic Metabolic Panel: Recent Labs  Lab 09/28/19 1237 09/29/19 0316 09/30/19 0340 10/01/19 0349 10/02/19 0422  NA 138 139 141 140 139  K 4.5 5.1 4.9 4.5 4.1  CL 102 105 108 104 102  CO2 23 21* 25 27 26   GLUCOSE 146* 194* 222* 219* 248*  BUN 19 27* 30* 27* 27*  CREATININE 3.12* 2.76* 1.87* 1.67* 1.49*  CALCIUM 9.4 9.2 9.1 9.2 9.3   Liver Function Tests: Recent Labs  Lab 09/29/19 0316 09/30/19 0340 10/01/19 0349 10/02/19 0422  AST 25 28 29  37  ALT 20 21 24  34  ALKPHOS 40 50 45 54  BILITOT 0.5 0.3 0.9 0.4  PROT 8.4* 7.8 8.0 7.4  ALBUMIN 2.9* 2.7* 2.9* 2.8*   No results for input(s): LIPASE, AMYLASE in the last 168 hours. No results for input(s): AMMONIA in the last 168 hours. CBC: Recent Labs  Lab 09/28/19 1237 09/29/19 0316 09/30/19 0340 10/01/19 0349 10/02/19 0422  WBC 14.4* 11.3* 21.2* 24.9* 23.7*  NEUTROABS 10.8* 9.8* 19.2* 22.2* 20.4*  HGB 12.6* 10.4* 10.1* 10.6* 11.2*  HCT 42.9 35.7* 34.9* 35.0* 36.9*  MCV 93.3 93.9 94.3 92.3 90.9  PLT 745* 516* 537* 535* 531*   Cardiac Enzymes: No results for input(s): CKTOTAL, CKMB, CKMBINDEX, TROPONINI in the last 168 hours. BNP: Invalid input(s): POCBNP CBG: No results for input(s): GLUCAP in the last 168 hours. D-Dimer Recent Labs    10/02/19 0931  DDIMER 0.96*   Hgb A1c No results for input(s): HGBA1C in the last 72 hours. Lipid Profile No results for input(s): CHOL, HDL, LDLCALC, TRIG, CHOLHDL, LDLDIRECT in the last 72 hours. Thyroid function studies No results for input(s): TSH, T4TOTAL, T3FREE, THYROIDAB in the last 72 hours.  Invalid input(s): FREET3 Anemia work up No results for input(s): VITAMINB12, FOLATE, FERRITIN, TIBC, IRON, RETICCTPCT in the last 72 hours. Urinalysis    Component Value Date/Time   COLORURINE AMBER (A) 09/18/2019 2019    APPEARANCEUR CLOUDY (A) 09/18/2019 2019   LABSPEC 1.023 09/18/2019 2019   PHURINE 5.0 09/18/2019 2019   GLUCOSEU NEGATIVE 09/18/2019 2019   HGBUR NEGATIVE 09/18/2019 2019   BILIRUBINUR NEGATIVE 09/18/2019 2019   KETONESUR NEGATIVE 09/18/2019 2019   PROTEINUR >=300 (A) 09/18/2019 2019   UROBILINOGEN 0.2 07/06/2013 1024   NITRITE NEGATIVE 09/18/2019 2019   LEUKOCYTESUR NEGATIVE 09/18/2019 2019   Sepsis Labs Invalid input(s): PROCALCITONIN,  WBC,  LACTICIDVEN Microbiology No results found for  this or any previous visit (from the past 240 hour(s)).  Procedures/Studies: NM Pulmonary Perfusion  Result Date: 09/19/2019 CLINICAL DATA:  Evaluate for pulmonary embolus. EXAM: NUCLEAR MEDICINE PERFUSION LUNG SCAN TECHNIQUE: Perfusion images were obtained in multiple projections after intravenous injection of radiopharmaceutical. Ventilation scans intentionally deferred if perfusion scan and chest x-ray adequate for interpretation during COVID 19 epidemic. RADIOPHARMACEUTICALS:  1.6 mCi Tc-59m MAA IV COMPARISON:  Chest radiograph from 09/18/2019 FINDINGS: There is a small peripheral defect identified within the lateral aspect of the right upper lobe which is unchanged from 03/08/2018 and is favored to represent an area of chronic post inflammatory change as noted on chest radiograph. There is an otherwise uniform distribution of the radiopharmaceutical in both lungs. IMPRESSION: No findings identified worrisome for acute pulmonary embolus. Electronically Signed   By: Kerby Moors M.D.   On: 09/19/2019 17:00   US RENAL  Result Date: 09/19/2019 CLINICAL DATA:  Acute kidney injury. EXAM: RENAL / URINARY TRACT ULTRASOUND COMPLETE COMPARISON:  03/27/2018 FINDINGS: Right Kidney: Renal measurements: 11.6 x 5.9 x 6.0 cm = volume: 221 mL. Normal renal cortical thickness and echogenicity without focal lesions or hydronephrosis. There is a simple appearing 1.4 cm upper pole cyst noted. Left Kidney: Renal  measurements: 12.1 x 6.2 x 6.8 cm = volume: Inter 268 mL. Normal renal cortical thickness and echogenicity without focal lesions or hydronephrosis. Simple appearing 1.4 cm upper pole cyst. Bladder: Appears normal for degree of bladder distention. Other: None. IMPRESSION: 1. Normal renal cortical thickness and echogenicity without hydronephrosis. 2. Small bilateral renal cysts. Electronically Signed   By: Marijo Sanes M.D.   On: 09/19/2019 10:27   US Venous Img Lower Bilateral (DVT)  Result Date: 09/19/2019 CLINICAL DATA:  Elevated D-dimer.  History of COVID-19. EXAM: BILATERAL LOWER EXTREMITY VENOUS DOPPLER ULTRASOUND TECHNIQUE: Gray-scale sonography with graded compression, as well as color Doppler and duplex ultrasound were performed to evaluate the lower extremity deep venous systems from the level of the common femoral vein and including the common femoral, femoral, profunda femoral, popliteal and calf veins including the posterior tibial, peroneal and gastrocnemius veins when visible. The superficial great saphenous vein was also interrogated. Spectral Doppler was utilized to evaluate flow at rest and with distal augmentation maneuvers in the common femoral, femoral and popliteal veins. COMPARISON:  None. FINDINGS: RIGHT LOWER EXTREMITY Common Femoral Vein: No evidence of thrombus. Normal compressibility, respiratory phasicity and response to augmentation. Saphenofemoral Junction: No evidence of thrombus. Normal compressibility and flow on color Doppler imaging. Profunda Femoral Vein: No evidence of thrombus. Normal compressibility and flow on color Doppler imaging. Femoral Vein: No evidence of thrombus. Normal compressibility, respiratory phasicity and response to augmentation. Popliteal Vein: No evidence of thrombus. Normal compressibility, respiratory phasicity and response to augmentation. Calf Veins: Visualized right deep calf veins are patent without thrombus. Superficial Great Saphenous Vein: No  evidence of thrombus. Normal compressibility. Other Findings:  None. LEFT LOWER EXTREMITY Common Femoral Vein: No evidence of thrombus. Normal compressibility, respiratory phasicity and response to augmentation. Saphenofemoral Junction: No evidence of thrombus. Normal compressibility and flow on color Doppler imaging. Profunda Femoral Vein: No evidence of thrombus. Normal compressibility and flow on color Doppler imaging. Femoral Vein: No evidence of thrombus. Normal compressibility, respiratory phasicity and response to augmentation. Popliteal Vein: No evidence of thrombus. Normal compressibility, respiratory phasicity and response to augmentation. Calf Veins: Visualized left deep calf veins are patent without thrombus. Superficial Great Saphenous Vein: No evidence of thrombus. Normal compressibility. Other Findings:  None.  IMPRESSION: No evidence of deep venous thrombosis in either lower extremity. Electronically Signed   By: Markus Daft M.D.   On: 09/19/2019 10:22   DG Chest Port 1 View  Result Date: 09/28/2019 CLINICAL DATA:  Shortness of breath. Diagnosed with COVID-19 infection last month. History of COPD. EXAM: PORTABLE CHEST 1 VIEW COMPARISON:  09/18/2019 FINDINGS: The cardiomediastinal silhouette is unchanged. Lung volumes are unchanged with persistent mild elevation of the right hemidiaphragm. There is underlying chronic lung disease with suspected bullous disease in the right upper lobe and scarring in the left midlung. The interstitial markings are stable to slightly more prominent diffusely compared to the prior study. No new airspace consolidation, sizeable pleural effusion, pneumothorax is identified. No acute osseous abnormality is seen. IMPRESSION: Chronic lung disease with stable to slightly increased interstitial prominence. Residual/recurrent atypical infection is not excluded. No acute airspace consolidation. Electronically Signed   By: Logan Bores M.D.   On: 09/28/2019 15:29   DG Chest  Port 1 View  Result Date: 09/18/2019 CLINICAL DATA:  First follow-up after being diagnosed with COVID-19 near Christmas. EXAM: PORTABLE CHEST 1 VIEW COMPARISON:  Radiograph 08/29/2019 FINDINGS: Improved though persistent opacities present throughout both lungs. More mixed bandlike areas of scarring and or architectural distortion are present. A right apical lucency is similar to comparison studies and may reflect some bullous disease. Cardiomediastinal contours are likely accentuated by low volumes and portable technique. No acute osseous or soft tissue abnormality. Degenerative changes are present in the imaged spine and shoulders. IMPRESSION: 1. Improved though persistent opacities throughout both lungs. 2. Stable right apical lucency. This may reflect some bullous disease with additional areas of more chronic scarring. Electronically Signed   By: Lovena Le M.D.   On: 09/18/2019 17:45    Time coordinating discharge: Over 30 minutes  SIGNED:   Guilford Shi, MD  [facility] 10/02/2019, 2:05 PM Pager : [phone]

## 2019-10-02 NOTE — TOC Progression Note (Signed)
Transition of Care [facility]) - Progression Note    Patient Details  Name: Mathew Lara MRN: [id number] Date of Birth: October 09, 1960  Transition of Care [facility]) CM/SW Contact  Purcell Mouton, RN Phone Number: 10/02/2019, 3:31 PM  Clinical Narrative:    [name] Home O2 is from [place]. A call was made to in house rep to delivery a tank to room for transport home. Informed [employer] rep that O2 at pt's home was not working properly.        Expected Discharge Plan and Services           Expected Discharge Date: 10/02/19                                     Social Determinants of Health (SDOH) Interventions    Readmission Risk Interventions No flowsheet data found.

## 2019-10-12 ENCOUNTER — Emergency Department (HOSPITAL_COMMUNITY)
Admission: EM | Admit: 2019-10-12 | Discharge: 2019-10-12 | Disposition: A | Discharge status: Home / Self Care | Payer: BC Managed Care – PPO | Attending: Emergency Medicine | Admitting: Emergency Medicine

## 2019-10-12 ENCOUNTER — Other Ambulatory Visit: Payer: Self-pay

## 2019-10-12 ENCOUNTER — Emergency Department (HOSPITAL_COMMUNITY): Payer: BC Managed Care – PPO

## 2019-10-12 ENCOUNTER — Encounter (HOSPITAL_COMMUNITY): Payer: Self-pay

## 2019-10-12 DIAGNOSIS — Z96643 Presence of artificial hip joint, bilateral: Secondary | ICD-10-CM | POA: Insufficient documentation

## 2019-10-12 DIAGNOSIS — Z79899 Other long term (current) drug therapy: Secondary | ICD-10-CM | POA: Insufficient documentation

## 2019-10-12 DIAGNOSIS — Z87891 Personal history of nicotine dependence: Secondary | ICD-10-CM | POA: Diagnosis not present

## 2019-10-12 DIAGNOSIS — Z8616 Personal history of COVID-19: Secondary | ICD-10-CM | POA: Diagnosis not present

## 2019-10-12 DIAGNOSIS — I129 Hypertensive chronic kidney disease with stage 1 through stage 4 chronic kidney disease, or unspecified chronic kidney disease: Secondary | ICD-10-CM | POA: Diagnosis not present

## 2019-10-12 DIAGNOSIS — R0602 Shortness of breath: Secondary | ICD-10-CM | POA: Diagnosis not present

## 2019-10-12 DIAGNOSIS — R000 Tachycardia, unspecified: Secondary | ICD-10-CM | POA: Diagnosis not present

## 2019-10-12 DIAGNOSIS — N183 Chronic kidney disease, stage 3 unspecified: Secondary | ICD-10-CM | POA: Insufficient documentation

## 2019-10-12 DIAGNOSIS — J449 Chronic obstructive pulmonary disease, unspecified: Secondary | ICD-10-CM | POA: Diagnosis not present

## 2019-10-12 NOTE — ED Triage Notes (Signed)
Pt presents to ED with complaints of SOB since this am. Pt states his O2 sats were low this am, in the 80's when his wife went to check his O2. Pt tank empty upon arrival to triage. Pt chronically wears 2L at home. Pt sats 85% in traige on RA, placed on 2L and brought up to 92%

## 2019-10-12 NOTE — Progress Notes (Signed)
CSW in contact with patients PCP to inquire about home 02 needs. Staff member informs CSW that patient was receiving 02 from [place] which has since then been switched to [facility] and is now current with [place] of oxygen for needs.   CSW in contact with Rogelio Seen with [place] regarding patients 02 status. Thea Alken explains that is active with [employer] and currently receiving 02 from concentrator and has home fill system portable tank.   Thea Alken is instructing that patient contact [employer] at  [facility]: [phone] to get assistance and trouble shoot home fill tank system and if that does not resolve the issue, to contact [employer] so that a personnel can come out to the home and assist.   CSW will follow up with patient regarding this matter.   [facility] Trans[facility]  Clinical Social Worker  Ph: [phone]

## 2019-10-12 NOTE — ED Provider Notes (Signed)
[facility] Provider Note   CSN: [id number] Arrival date & time: 10/12/19  1114     History Chief Complaint  Patient presents with  . Shortness of Breath    Mathew Lara is a 59 y.o. male.  Pt reports his oxygen sat was low at home.  Pt reports his oxygen is not working.  (RN here reported pt's tank was empty) Pt reports he feels fine now.  He does not feel sick.  He reports he does not feel short of breath.  Pt reports he has been on 02 since having covid in December. Pt reports he also has COPD.   The history is provided by the patient. No language interpreter was used.  Shortness of Breath Severity:  Moderate Onset quality:  Sudden Duration:  1 day Timing:  Constant Progression:  Worsening Chronicity:  New Context: known allergens   Relieved by:  Nothing Worsened by:  Nothing Ineffective treatments:  Oxygen      Past Medical History:  Diagnosis Date  . Alcohol abuse   . Anxiety   . Bronchitis   . Chronic hip pain   . Chronic kidney disease (CKD) stage G3b/A3, moderately decreased glomerular filtration rate (GFR) between 30-44 mL/min/1.73 square meter and albuminuria creatinine ratio greater than 300 mg/g 01/03/2018  . COPD (chronic obstructive pulmonary disease) ([place])   . Hepatitis C   . Hypertension   . Mixed hyperlipidemia   . Renal disorder    states renal dysfunction, "strained kidney"  . Sleep apnea     Patient Active Problem List   Diagnosis Date Noted  . ILD (interstitial lung disease) ([place]) 10/02/2019  . GERD (gastroesophageal reflux disease) 10/02/2019  . COPD with acute exacerbation ([place]) 09/28/2019  . AKI (acute kidney injury) ([place]) 09/19/2019  . Acute renal failure superimposed on stage 3b chronic kidney disease ([place]) 09/18/2019  . Acute on chronic respiratory failure with hypoxia ([place])   . Chronic pain syndrome   . COVID-19 virus infection 08/29/2019  . 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 Acute  renal failure ([place]) 03/26/2018  . Rash 03/26/2018  . Chest pain 03/08/2018  . CKD (chronic kidney disease), stage III 03/08/2018  . COPD (chronic obstructive pulmonary disease) ([place]) 03/08/2018  . Avascular necrosis of bone of left hip ([place]) 07/27/2017  . Avascular necrosis of bone of right hip ([place]) 06/04/2016  . Status post total replacement of right hip 06/04/2016  . Hepatitis C 03/21/2015  . History of alcohol abuse 11/08/2013  . Tobacco abuse 11/08/2013  . Essential hypertension 11/07/2013  . Abnormal ECG 11/07/2013    Past Surgical History:  Procedure Laterality Date  . COLONOSCOPY N/A 01/23/2014   SLF:The LEFT COLON IS EXTREMELY redundant/TWO RECTAL POLYPS REMOVED/SMALL VOLUME RECTAL BLEEDING MOST LIKELY DUE TO Small internal hemorrhoids  . ESOPHAGOGASTRODUODENOSCOPY N/A 01/23/2014   VXB:LTJQZESP ring at the gastroesophageal junction/Medium sized hiatal hernia/NDYSPEPSIA MOST LIKELY DUE TO GERD/MILD Non-erosive gastritis  . ESOPHAGOGASTRODUODENOSCOPY N/A 04/18/2018   Procedure: ESOPHAGOGASTRODUODENOSCOPY (EGD);  Surgeon: Daneil Dolin, MD;  [facility];  Service: Endoscopy;  Laterality: N/A;  . HEMORRHOID BANDING  01/23/2014   Procedure: HEMORRHOID BANDING;  Surgeon: Danie Binder, MD;  Location: [facility];  Service: Endoscopy;;  . JOINT REPLACEMENT Right 2017  . MOUTH SURGERY    . TOTAL HIP ARTHROPLASTY Right 06/04/2016   Procedure: RIGHT TOTAL HIP ARTHROPLASTY ANTERIOR APPROACH;  Surgeon: Mcarthur Rossetti, MD;  Location: [facility];  Service: Orthopedics;  Laterality: Right;  . TOTAL HIP ARTHROPLASTY Left 03/02/2019   Procedure: LEFT TOTAL HIP ARTHROPLASTY ANTERIOR APPROACH;  Surgeon: Mcarthur Rossetti, MD;  Location: [facility];  Service: Orthopedics;  Laterality: Left;       Family History  Problem Relation Age of Onset  . Cancer Mother     Social History   Tobacco Use  . Smoking status: Former Smoker    Types: Cigarettes    Quit date:  05/28/2014    Years since quitting: 5.3  . Smokeless tobacco: Never Used  . Tobacco comment: quit in November 2015 after smoking x 20 yrs.  Substance Use Topics  . Alcohol use: Not Currently    Alcohol/week: 0.0 standard drinks    Comment: rare  . Drug use: No    Home Medications Prior to Admission medications   Medication Sig Start Date End Date Taking? Authorizing Provider  acetaminophen (TYLENOL) 325 MG tablet Take 2 tablets (650 mg total) by mouth every 6 (six) hours as needed for mild pain or headache (fever >/= 101). 09/20/19   Roxan Hockey, MD  ALPRAZolam Duanne Moron) 0.5 MG tablet Take 1 tablet (0.5 mg total) by mouth every 12 (twelve) hours as needed for anxiety or sleep. 09/20/19   Roxan Hockey, MD  amLODipine (NORVASC) 10 MG tablet Take 10 mg by mouth daily. 02/05/19   [provider]  ascorbic acid (VITAMIN C) 500 MG tablet Take 1 tablet (500 mg total) by mouth daily. 09/03/19   Barton Dubois, MD  aspirin EC 81 MG tablet Take 1 tablet (81 mg total) by mouth 2 (two) times daily with a meal. 09/20/19 09/19/20  Roxan Hockey, MD  budesonide-formoterol (SYMBICORT) 160-4.5 MCG/ACT inhaler Inhale 2 puffs into the lungs 2 (two) times daily.  09/18/19   [provider]  guaiFENesin-codeine 100-10 MG/5ML syrup Take 10 mLs by mouth every 6 (six) hours as needed for cough. 09/02/19   Barton Dubois, MD  Ipratropium-Albuterol (COMBIVENT) 20-100 MCG/ACT AERS respimat Inhale 1 puff into the lungs every 6 (six) hours. 10/02/19   Guilford Shi, MD  loratadine (CLARITIN) 10 MG tablet Take 1 tablet (10 mg total) by mouth daily. 09/02/19   Barton Dubois, MD  metoprolol tartrate (LOPRESSOR) 25 MG tablet Take 0.5 tablets (12.5 mg total) by mouth 2 (two) times daily. 10/02/19   Guilford Shi, MD  ondansetron (ZOFRAN) 4 MG tablet Take 4-8 mg by mouth every 8 (eight) hours as needed for nausea or vomiting.    [provider]  oxyCODONE (OXY IR/ROXICODONE) 5 MG immediate  release tablet Take 1-2 tablets (5-10 mg total) by mouth every 6 (six) hours as needed for moderate pain (pain score 4-6). 03/19/19   Pete Pelt, PA-C  pantoprazole (PROTONIX) 40 MG tablet Take 1 tablet (40 mg total) by mouth daily. Patient not taking: Reported on 09/19/2019 09/02/19 09/01/20  Barton Dubois, MD  predniSONE (DELTASONE) 20 MG tablet Take 2 tabs (40mg ) x 3 days , then 1 tab (20mg ) x 3 days then 10 mg ( 1/2 tab) x 2 days , then stop 10/02/19   Guilford Shi, MD  PROAIR HFA 108 [phone] Base) MCG/ACT inhaler Inhale 2 puffs into the lungs every 4 (four) hours as needed for wheezing or shortness of breath. 10/02/19   Guilford Shi, MD  senna-docusate (SENOKOT-S) 8.6-50 MG tablet Take 2 tablets by mouth at bedtime. 09/20/19   Roxan Hockey, MD  zinc sulfate 220 (50 Zn) MG capsule Take 1 capsule (220 mg total) by mouth daily. 09/03/19  Barton Dubois, MD    Allergies    Patient has no known allergies.  Review of Systems   Review of Systems  Respiratory: Positive for shortness of breath.   All other systems reviewed and are negative.   Physical Exam Updated Vital Signs BP 113/89 (BP Location: Right Arm)   Pulse (!) 125   Temp 98.4 F (36.9 C) (Oral)   Resp 18   Ht 5\' 8"  (1.727 m)   Wt 115.7 kg   SpO2 90%   BMI 38.77 kg/m   Physical Exam Vitals and nursing note reviewed.  Constitutional:      Appearance: He is well-developed.  HENT:     Head: Normocephalic and atraumatic.  Eyes:     Conjunctiva/sclera: Conjunctivae normal.  Cardiovascular:     Rate and Rhythm: Normal rate and regular rhythm.     Heart sounds: No murmur.  Pulmonary:     Effort: Pulmonary effort is normal. No respiratory distress.     Breath sounds: Normal breath sounds. No decreased breath sounds.  Abdominal:     Palpations: Abdomen is soft.     Tenderness: There is no abdominal tenderness.  Musculoskeletal:     Cervical back: Neck supple.  Skin:    General: Skin is warm and dry.    Neurological:     General: No focal deficit present.     Mental Status: He is alert.  Psychiatric:        Mood and Affect: Mood normal.     ED Results / Procedures / Treatments   Labs (all labs ordered are listed, but only abnormal results are displayed) Labs Reviewed - No data to display  EKG EKG Interpretation  Date/Time:  Friday October 12 2019 11:39:18 EST Ventricular Rate:  103 PR Interval:    QRS Duration: 76 QT Interval:  323 QTC Calculation: 423 R Axis:   49 Text Interpretation: Sinus tachycardia Baseline wander in lead(s) V2 No STEMI Confirmed by Octaviano Glow [phone]) on 10/12/2019 12:26:05 PM   Radiology DG Chest Port 1 View  Result Date: 10/12/2019 CLINICAL DATA:  Short of breath, hypoxia EXAM: PORTABLE CHEST 1 VIEW COMPARISON:  09/28/2019 FINDINGS: Single frontal view of the chest demonstrates a stable cardiac silhouette. Bullous emphysema again noted. No airspace disease, effusion, or pneumothorax. No acute bony abnormalities. IMPRESSION: 1. Stable bullous emphysema.  No acute airspace disease. Electronically Signed   By: Randa Ngo M.D.   On: 10/12/2019 12:21    Procedures Procedures (including critical care time)  Medications Ordered in ED Medications - No data to display  ED Course  I have reviewed the triage vital signs and the nursing notes.  Pertinent labs & imaging results that were available during my care of the patient were reviewed by me and considered in my medical decision making (see chart for details).    MDM Rules/Calculators/A&P                      MDM: Social work consulted. She spoke with 02 provider.   Home health requested to have Rn visit to help with equipment.  Final Clinical Impression(s) / ED Diagnoses Final diagnoses:  SOB (shortness of breath)    Rx / DC Orders ED Discharge Orders    None       Sidney Ace 10/12/19 1706    Wyvonnia Dusky, MD 10/12/19 2047

## 2019-10-12 NOTE — Progress Notes (Signed)
Home health RN referral given to [facility]. Octavia Bruckner is handing over referral to his director for approval.   CSW also in contact with [facility] who reports that a staff member will be out provide education around 02 tank.  Will continue to follow for discharge related needs  [facility] Transitions of Care  Clinical Social Worker  Ph: [phone]

## 2019-10-12 NOTE — Discharge Instructions (Signed)
Use oxygen as instructed.  A home health nurse will come out to assist you with using your oxygen equipment

## 2019-10-12 NOTE — Progress Notes (Signed)
Consult request has been received. CSW attempting to follow up at present time  Kolbie Clarkston M. Jhania Etherington LCSWA Transitions of Care  Clinical Social Worker  Ph: [phone] 

## 2019-10-19 DIAGNOSIS — R0902 Hypoxemia: Secondary | ICD-10-CM | POA: Diagnosis not present

## 2019-10-19 DIAGNOSIS — U071 COVID-19: Secondary | ICD-10-CM | POA: Diagnosis not present

## 2019-10-19 DIAGNOSIS — G894 Chronic pain syndrome: Secondary | ICD-10-CM | POA: Diagnosis not present

## 2019-10-19 DIAGNOSIS — Z6838 Body mass index (BMI) 38.0-38.9, adult: Secondary | ICD-10-CM | POA: Diagnosis not present

## 2019-10-27 DIAGNOSIS — U071 COVID-19: Secondary | ICD-10-CM | POA: Diagnosis not present

## 2019-10-27 DIAGNOSIS — J449 Chronic obstructive pulmonary disease, unspecified: Secondary | ICD-10-CM | POA: Diagnosis not present

## 2019-10-31 DIAGNOSIS — H2513 Age-related nuclear cataract, bilateral: Secondary | ICD-10-CM | POA: Diagnosis not present

## 2019-11-05 DIAGNOSIS — D631 Anemia in chronic kidney disease: Secondary | ICD-10-CM | POA: Diagnosis not present

## 2019-11-05 DIAGNOSIS — N184 Chronic kidney disease, stage 4 (severe): Secondary | ICD-10-CM | POA: Diagnosis not present

## 2019-11-05 DIAGNOSIS — E559 Vitamin D deficiency, unspecified: Secondary | ICD-10-CM | POA: Diagnosis not present

## 2019-11-05 DIAGNOSIS — Z79899 Other long term (current) drug therapy: Secondary | ICD-10-CM | POA: Diagnosis not present

## 2019-11-09 DIAGNOSIS — E559 Vitamin D deficiency, unspecified: Secondary | ICD-10-CM | POA: Diagnosis not present

## 2019-11-09 DIAGNOSIS — R809 Proteinuria, unspecified: Secondary | ICD-10-CM | POA: Diagnosis not present

## 2019-11-09 DIAGNOSIS — U071 COVID-19: Secondary | ICD-10-CM | POA: Diagnosis not present

## 2019-11-09 DIAGNOSIS — Z6839 Body mass index (BMI) 39.0-39.9, adult: Secondary | ICD-10-CM | POA: Diagnosis not present

## 2019-11-09 DIAGNOSIS — N1831 Chronic kidney disease, stage 3a: Secondary | ICD-10-CM | POA: Diagnosis not present

## 2019-11-09 DIAGNOSIS — N170 Acute kidney failure with tubular necrosis: Secondary | ICD-10-CM | POA: Diagnosis not present

## 2019-11-09 DIAGNOSIS — J449 Chronic obstructive pulmonary disease, unspecified: Secondary | ICD-10-CM | POA: Diagnosis not present

## 2019-11-20 DIAGNOSIS — G894 Chronic pain syndrome: Secondary | ICD-10-CM | POA: Diagnosis not present

## 2019-11-20 DIAGNOSIS — Z6839 Body mass index (BMI) 39.0-39.9, adult: Secondary | ICD-10-CM | POA: Diagnosis not present

## 2019-11-22 DIAGNOSIS — H2589 Other age-related cataract: Secondary | ICD-10-CM | POA: Diagnosis not present

## 2019-11-22 DIAGNOSIS — H5703 Miosis: Secondary | ICD-10-CM | POA: Diagnosis not present

## 2019-11-23 DIAGNOSIS — R0902 Hypoxemia: Secondary | ICD-10-CM | POA: Diagnosis not present

## 2019-11-23 DIAGNOSIS — Z6841 Body Mass Index (BMI) 40.0 and over, adult: Secondary | ICD-10-CM | POA: Diagnosis not present

## 2019-11-24 DIAGNOSIS — U071 COVID-19: Secondary | ICD-10-CM | POA: Diagnosis not present

## 2019-11-24 DIAGNOSIS — J449 Chronic obstructive pulmonary disease, unspecified: Secondary | ICD-10-CM | POA: Diagnosis not present

## 2019-11-26 ENCOUNTER — Encounter (HOSPITAL_COMMUNITY): Payer: Self-pay

## 2019-11-26 ENCOUNTER — Other Ambulatory Visit: Payer: Self-pay

## 2019-11-26 ENCOUNTER — Emergency Department (HOSPITAL_COMMUNITY)
Admission: EM | Admit: 2019-11-26 | Discharge: 2019-11-26 | Disposition: A | Discharge status: Home / Self Care | Payer: BC Managed Care – PPO | Attending: Emergency Medicine | Admitting: Emergency Medicine

## 2019-11-26 ENCOUNTER — Emergency Department (HOSPITAL_COMMUNITY): Payer: BC Managed Care – PPO

## 2019-11-26 DIAGNOSIS — J449 Chronic obstructive pulmonary disease, unspecified: Secondary | ICD-10-CM | POA: Diagnosis not present

## 2019-11-26 DIAGNOSIS — R0602 Shortness of breath: Secondary | ICD-10-CM | POA: Insufficient documentation

## 2019-11-26 DIAGNOSIS — Z79899 Other long term (current) drug therapy: Secondary | ICD-10-CM | POA: Insufficient documentation

## 2019-11-26 DIAGNOSIS — Z8616 Personal history of COVID-19: Secondary | ICD-10-CM | POA: Insufficient documentation

## 2019-11-26 DIAGNOSIS — Z9889 Other specified postprocedural states: Secondary | ICD-10-CM | POA: Diagnosis not present

## 2019-11-26 DIAGNOSIS — R079 Chest pain, unspecified: Secondary | ICD-10-CM | POA: Diagnosis not present

## 2019-11-26 DIAGNOSIS — I129 Hypertensive chronic kidney disease with stage 1 through stage 4 chronic kidney disease, or unspecified chronic kidney disease: Secondary | ICD-10-CM | POA: Insufficient documentation

## 2019-11-26 DIAGNOSIS — R0789 Other chest pain: Secondary | ICD-10-CM | POA: Diagnosis not present

## 2019-11-26 DIAGNOSIS — N1832 Chronic kidney disease, stage 3b: Secondary | ICD-10-CM | POA: Diagnosis not present

## 2019-11-26 DIAGNOSIS — E876 Hypokalemia: Secondary | ICD-10-CM | POA: Insufficient documentation

## 2019-11-26 DIAGNOSIS — R002 Palpitations: Secondary | ICD-10-CM | POA: Insufficient documentation

## 2019-11-26 LAB — CBC
HCT: 42.8 % (ref 39.0–52.0)
Hemoglobin: 13.4 g/dL (ref 13.0–17.0)
MCH: 27.1 pg (ref 26.0–34.0)
MCHC: 31.3 g/dL (ref 30.0–36.0)
MCV: 86.5 fL (ref 80.0–100.0)
Platelets: 311 10*3/uL (ref 150–400)
RBC: 4.95 MIL/uL (ref 4.22–5.81)
RDW: 18 % — ABNORMAL HIGH (ref 11.5–15.5)
WBC: 10.7 10*3/uL — ABNORMAL HIGH (ref 4.0–10.5)
nRBC: 0 % (ref 0.0–0.2)

## 2019-11-26 LAB — BASIC METABOLIC PANEL
Anion gap: 13 (ref 5–15)
BUN: 14 mg/dL (ref 6–20)
CO2: 25 mmol/L (ref 22–32)
Calcium: 9.7 mg/dL (ref 8.9–10.3)
Chloride: 98 mmol/L (ref 98–111)
Creatinine, Ser: 1.35 mg/dL — ABNORMAL HIGH (ref 0.61–1.24)
GFR calc Af Amer: >60 mL/min (ref >60)
GFR calc non Af Amer: 57 mL/min — ABNORMAL LOW (ref >60)
Glucose, Bld: 129 mg/dL — ABNORMAL HIGH (ref 70–99)
Potassium: 3.4 mmol/L — ABNORMAL LOW (ref 3.5–5.1)
Sodium: 136 mmol/L (ref 135–145)

## 2019-11-26 LAB — TROPONIN I (HIGH SENSITIVITY)
Troponin I (High Sensitivity): 9 ng/L (ref <18)
Troponin I (High Sensitivity): 9 ng/L (ref <18)

## 2019-11-26 MED ORDER — POTASSIUM CHLORIDE CRYS ER 20 MEQ PO TBCR
40.0000 meq | EXTENDED_RELEASE_TABLET | Freq: Once | ORAL | Status: AC
  Administered 2019-11-26: 40 meq via ORAL
  Filled 2019-11-26: qty 2

## 2019-11-26 MED ORDER — SODIUM CHLORIDE 0.9% FLUSH
3.0000 mL | Freq: Once | INTRAVENOUS | Status: AC
  Administered 2019-11-26: 3 mL via INTRAVENOUS

## 2019-11-26 NOTE — ED Triage Notes (Signed)
Pt to er, pt states that he had his cataracts removes last thrusday, states that since then he has been having some chest pain and feels like his heart is racing.  States that he is here today because the sob and chest pain isn't getting any better.  Pt on 2L via [place].  States that he is on O2 at home

## 2019-11-26 NOTE — ED Provider Notes (Signed)
Care assumed from Dr. Laverta Baltimore at shift change.  Patient awaiting results of a second troponin.  Patient has apparently been experiencing palpitations for the past couple of days.  He denies pain or difficulty breathing.  Care signed out to me awaiting results of a second troponin.  This has returned and is negative.  Patient appears stable and comfortable and I believe appropriate for discharge.  He is to follow-up with cardiology as recommended by Dr. Laverta Baltimore.  To return as needed for any problems.   Veryl Speak, MD 11/26/19 1626

## 2019-11-26 NOTE — Discharge Instructions (Addendum)
You presented to the ED with chief complaint of palpitations for roughly the past 5 days.  While you are here, our main concern was to rule out possible heart attack.  We were able to satisfactorily said you are not having a heart attack or any serious heart problem right now.  We are not sure what is causing this feeling of tachycardia.  We recommend following up with your outpatient provider and possibly touching base with cardiology for the symptoms.

## 2019-11-26 NOTE — ED Notes (Signed)
Pt has hiccups.

## 2019-11-26 NOTE — ED Provider Notes (Signed)
[name] Provider Note   CSN: [id number] Arrival date & time: 11/26/19  1249     History Chief Complaint  Patient presents with  . Chest Pain    Mathew Lara is a 59 y.o. male.  Mathew Lara is is a 59 year old man who presents to the ED with 5 days of palpitations.  He has a previous medical history significant for hypertension, smoking, COPD and Covid infection in December 2020.  He reports that he had a recent eye surgery performed 5 days ago and has been experiencing palpitations since then.  He notes a subjective discomfort in his middle/left chest that does not radiate to his arm, back, jaw.     HPI: A 59 year old patient with a history of hypertension and obesity presents for evaluation of chest pain. Initial onset of pain was more than 6 hours ago. The patient's chest pain is not worse with exertion. The patient's chest pain is middle- or left-sided, is not well-localized, is not described as heaviness/pressure/tightness, is not sharp and does not radiate to the arms/jaw/neck. The patient does not complain of nausea and denies diaphoresis. The patient has smoked in the past 90 days. The patient has no history of stroke, has no history of peripheral artery disease, denies any history of treated diabetes, has no relevant family history of coronary artery disease (first degree relative at less than age 28) and has no history of hypercholesterolemia.   Past Medical History:  Diagnosis Date  . Alcohol abuse   . Anxiety   . Bronchitis   . Chronic hip pain   . Chronic kidney disease (CKD) stage G3b/A3, moderately decreased glomerular filtration rate (GFR) between 30-44 mL/min/1.73 square meter and albuminuria creatinine ratio greater than 300 mg/g 01/03/2018  . COPD (chronic obstructive pulmonary disease) ([place])   . Hepatitis C   . Hypertension   . Mixed hyperlipidemia   . Renal disorder    states renal dysfunction, "strained kidney"  . Sleep apnea      Patient Active Problem List   Diagnosis Date Noted  . ILD (interstitial lung disease) ([place]) 10/02/2019  . GERD (gastroesophageal reflux disease) 10/02/2019  . COPD with acute exacerbation ([place]) 09/28/2019  . AKI (acute kidney injury) ([place]) 09/19/2019  . Acute renal failure superimposed on stage 3b chronic kidney disease ([place]) 09/18/2019  . Acute on chronic respiratory failure with hypoxia ([place])   . Chronic pain syndrome   . COVID-19 virus infection 08/29/2019  . 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 Acute renal failure ([place]) 03/26/2018  . Rash 03/26/2018  . Chest pain 03/08/2018  . CKD (chronic kidney disease), stage III 03/08/2018  . COPD (chronic obstructive pulmonary disease) ([place]) 03/08/2018  . Avascular necrosis of bone of left hip ([place]) 07/27/2017  . Avascular necrosis of bone of right hip ([place]) 06/04/2016  . Status post total replacement of right hip 06/04/2016  . Hepatitis C 03/21/2015  . History of alcohol abuse 11/08/2013  . Tobacco abuse 11/08/2013  . Essential hypertension 11/07/2013  . Abnormal ECG 11/07/2013    Past Surgical History:  Procedure Laterality Date  . CATARACT EXTRACTION Right   . COLONOSCOPY N/A 01/23/2014   SLF:The LEFT COLON IS EXTREMELY redundant/TWO RECTAL POLYPS REMOVED/SMALL VOLUME RECTAL BLEEDING MOST LIKELY DUE TO Small internal hemorrhoids  . ESOPHAGOGASTRODUODENOSCOPY N/A 01/23/2014   LZJ:QBHALPFX ring at the gastroesophageal junction/Medium sized hiatal hernia/NDYSPEPSIA MOST LIKELY DUE TO GERD/MILD Non-erosive gastritis  . ESOPHAGOGASTRODUODENOSCOPY N/A 04/18/2018   Procedure: ESOPHAGOGASTRODUODENOSCOPY (EGD);  Surgeon: Daneil Dolin, MD;  [facility];  Service: Endoscopy;  Laterality: N/A;  . HEMORRHOID BANDING  01/23/2014   Procedure: HEMORRHOID BANDING;  Surgeon: Danie Binder, MD;  [facility];  Service: Endoscopy;;  . JOINT REPLACEMENT Right 2017  . MOUTH SURGERY    . TOTAL HIP  ARTHROPLASTY Right 06/04/2016   Procedure: RIGHT TOTAL HIP ARTHROPLASTY ANTERIOR APPROACH;  Surgeon: Mcarthur Rossetti, MD;  [facility];  Service: Orthopedics;  Laterality: Right;  . TOTAL HIP ARTHROPLASTY Left 03/02/2019   Procedure: LEFT TOTAL HIP ARTHROPLASTY ANTERIOR APPROACH;  Surgeon: Mcarthur Rossetti, MD;  [facility];  Service: Orthopedics;  Laterality: Left;       Family History  Problem Relation Age of Onset  . Cancer Mother     Social History   Tobacco Use  . Smoking status: Former Smoker    Types: Cigarettes    Quit date: 05/28/2014    Years since quitting: 5.5  . Smokeless tobacco: Never Used  . Tobacco comment: quit in November 2015 after smoking x 20 yrs.  Substance Use Topics  . Alcohol use: Not Currently    Alcohol/week: 0.0 standard drinks  . Drug use: No    Home Medications Prior to Admission medications   Medication Sig Start Date End Date Taking? Authorizing Provider  acetaminophen (TYLENOL) 325 MG tablet Take 2 tablets (650 mg total) by mouth every 6 (six) hours as needed for mild pain or headache (fever >/= 101). 09/20/19   Roxan Hockey, MD  ALPRAZolam Duanne Moron) 0.5 MG tablet Take 1 tablet (0.5 mg total) by mouth every 12 (twelve) hours as needed for anxiety or sleep. 09/20/19   Roxan Hockey, MD  amLODipine (NORVASC) 10 MG tablet Take 10 mg by mouth daily. 02/05/19   [provider]  ascorbic acid (VITAMIN C) 500 MG tablet Take 1 tablet (500 mg total) by mouth daily. 09/03/19   Barton Dubois, MD  aspirin EC 81 MG tablet Take 1 tablet (81 mg total) by mouth 2 (two) times daily with a meal. 09/20/19 09/19/20  Roxan Hockey, MD  budesonide-formoterol (SYMBICORT) 160-4.5 MCG/ACT inhaler Inhale 2 puffs into the lungs 2 (two) times daily.  09/18/19   [provider]  guaiFENesin-codeine 100-10 MG/5ML syrup Take 10 mLs by mouth every 6 (six) hours as needed for cough. 09/02/19   Barton Dubois, MD  Ipratropium-Albuterol  (COMBIVENT) 20-100 MCG/ACT AERS respimat Inhale 1 puff into the lungs every 6 (six) hours. 10/02/19   Guilford Shi, MD  loratadine (CLARITIN) 10 MG tablet Take 1 tablet (10 mg total) by mouth daily. 09/02/19   Barton Dubois, MD  metoprolol tartrate (LOPRESSOR) 25 MG tablet Take 0.5 tablets (12.5 mg total) by mouth 2 (two) times daily. 10/02/19   Guilford Shi, MD  ondansetron (ZOFRAN) 4 MG tablet Take 4-8 mg by mouth every 8 (eight) hours as needed for nausea or vomiting.    [provider]  oxyCODONE (OXY IR/ROXICODONE) 5 MG immediate release tablet Take 1-2 tablets (5-10 mg total) by mouth every 6 (six) hours as needed for moderate pain (pain score 4-6). 03/19/19   Pete Pelt, PA-C  pantoprazole (PROTONIX) 40 MG tablet Take 1 tablet (40 mg total) by mouth daily. Patient not taking: Reported on 09/19/2019 09/02/19 09/01/20  Barton Dubois, MD  predniSONE (DELTASONE) 20 MG tablet Take 2 tabs (40mg ) x 3 days , then 1 tab (20mg ) x 3 days then 10 mg ( 1/2 tab) x 2 days ,  then stop 10/02/19   Guilford Shi, MD  PROAIR HFA 108 [phone] Base) MCG/ACT inhaler Inhale 2 puffs into the lungs every 4 (four) hours as needed for wheezing or shortness of breath. 10/02/19   Guilford Shi, MD  senna-docusate (SENOKOT-S) 8.6-50 MG tablet Take 2 tablets by mouth at bedtime. 09/20/19   Roxan Hockey, MD  zinc sulfate 220 (50 Zn) MG capsule Take 1 capsule (220 mg total) by mouth daily. 09/03/19   Barton Dubois, MD    Allergies    Patient has no known allergies.  Review of Systems   Review of Systems  Constitutional: Negative for chills and fever.  HENT: Negative for congestion.   Respiratory: Positive for cough (not new) and shortness of breath. Negative for chest tightness.   Cardiovascular: Positive for palpitations. Negative for chest pain.  Gastrointestinal: Positive for nausea (mild nausea today). Negative for abdominal pain, constipation, diarrhea and vomiting.  Endocrine: Positive  for polyuria.  Genitourinary: Negative for dysuria.  Musculoskeletal: Negative for arthralgias.  Skin: Negative for rash.  Neurological: Negative for headaches.    Physical Exam Updated Vital Signs BP 123/89 (BP Location: Left Arm)   Pulse 99   Temp 97.7 F (36.5 C) (Oral)   Resp (!) 24   Ht 5\' 8"  (1.727 m)   Wt 121.1 kg   SpO2 91%   BMI 40.60 kg/m   Physical Exam  ED Results / Procedures / Treatments   Labs (all labs ordered are listed, but only abnormal results are displayed) Labs Reviewed  CBC - Abnormal; Notable for the following components:      Result Value   WBC 10.7 (*)    RDW 18.0 (*)    All other components within normal limits  BASIC METABOLIC PANEL  TROPONIN I (HIGH SENSITIVITY)    EKG EKG Interpretation  Date/Time:  Monday November 26 2019 13:04:52 EDT Ventricular Rate:  94 PR Interval:  174 QRS Duration: 70 QT Interval:  360 QTC Calculation: 450 R Axis:     Text Interpretation: Normal sinus rhythm Minimal voltage criteria for LVH, may be normal variant ( R in aVL ) Borderline ECG Confirmed by Nat Christen [phone]) on 11/26/2019 1:14:04 PM   Radiology DG Chest 2 View  Result Date: 11/26/2019 CLINICAL DATA:  Chest pain. Shortness of breath. EXAM: CHEST - 2 VIEW COMPARISON:  10/12/2019 FINDINGS: The cardiomediastinal contours are normal. Right upper lobe scarring with probable underlying bullous disease, stable from prior. Additional scarring and probable in the left perihilar lung. Central bronchial thickening. Pulmonary vasculature is normal. No consolidation, pleural effusion, or pneumothorax. No acute osseous abnormalities are seen. IMPRESSION: 1. No acute findings or change from prior exam. 2. Chronic central bronchial thickening and upper lobe predominant lung disease with probable underlying bullous change. This is stable from prior. Electronically Signed   By: Keith Rake M.D.   On: 11/26/2019 13:47    Procedures Procedures (including critical  care time)  Medications Ordered in ED Medications  sodium chloride flush (NS) 0.9 % injection 3 mL (3 mLs Intravenous Given by Other 11/26/19 1338)    ED Course  I have reviewed the triage vital signs and the nursing notes.  Pertinent labs & imaging results that were available during my care of the patient were reviewed by me and considered in my medical decision making (see chart for details).    MDM Rules/Calculators/A&P HEAR Score: 4  This a Mathew Lara is a 59 year old gentleman who comes to the emergency room today for evaluation of palpitations for the past 5 days.  He does have risk factors for ACS.  Differential at this time includes ACS, long Covid syndrome, anxiety, possible infection.  We will draw basic labs, troponin, chest x-ray.  Initial labs, chest x-ray, EKG are unremarkable.  On reevaluation, he notes polyuria, will plan for UA.  Currently waiting for second troponin, plan to discharge home if second troponin remains unremarkable. Final Clinical Impression(s) / ED Diagnoses Final diagnoses:  None    Rx / DC Orders ED Discharge Orders    None       Matilde Haymaker, MD 11/26/19 1457    Margette Fast, MD 11/26/19 [phone]

## 2019-11-28 DIAGNOSIS — Z23 Encounter for immunization: Secondary | ICD-10-CM | POA: Diagnosis not present

## 2019-12-10 DIAGNOSIS — G894 Chronic pain syndrome: Secondary | ICD-10-CM | POA: Diagnosis not present

## 2019-12-10 DIAGNOSIS — Z6841 Body Mass Index (BMI) 40.0 and over, adult: Secondary | ICD-10-CM | POA: Diagnosis not present

## 2019-12-10 DIAGNOSIS — J449 Chronic obstructive pulmonary disease, unspecified: Secondary | ICD-10-CM | POA: Diagnosis not present

## 2019-12-18 DIAGNOSIS — G4733 Obstructive sleep apnea (adult) (pediatric): Secondary | ICD-10-CM | POA: Diagnosis not present

## 2019-12-18 DIAGNOSIS — Z6841 Body Mass Index (BMI) 40.0 and over, adult: Secondary | ICD-10-CM | POA: Diagnosis not present

## 2019-12-18 DIAGNOSIS — G894 Chronic pain syndrome: Secondary | ICD-10-CM | POA: Diagnosis not present

## 2019-12-18 DIAGNOSIS — M169 Osteoarthritis of hip, unspecified: Secondary | ICD-10-CM | POA: Diagnosis not present

## 2019-12-23 ENCOUNTER — Encounter: Payer: Self-pay | Admitting: Cardiology

## 2019-12-23 NOTE — Progress Notes (Signed)
Cardiology Office Note  Date: 12/24/2019   ID: Mathew Lara, DOB March 05, 1961, MRN [id number]  PCP:  [name], MD  Cardiologist:  Rozann Lesches, MD Electrophysiologist:  None   Chief Complaint  Patient presents with  . Palpitations    History of Present Illness: Mathew Lara is a 59 y.o. male referred for cardiology consultation by Dr. Laverta Baltimore for the evaluation of palpitations, he was seen in the ER in late March - I reviewed the notes.  He states that the symptoms that brought him into the ER that particular evening were mainly a feeling of "fluttering" on the left side of his chest, this was noted at rest, worse when he would try to sit up.  Also felt tight in his chest while this was going on.  Symptoms resolved and have not been recurring on any consistent basis, although he does state like his heart rate increases with activity such as walking up steps, not spontaneously.  He is not reporting any reproducible, exertional chest discomfort.  He does have limitations related to chronic hip pain bilaterally, not progressively short of breath with activity.  I reviewed his history which is outlined below.  He reports compliance with his medications and regular follow-up with [facility].  Past Medical History:  Diagnosis Date  . Alcohol abuse   . Anxiety   . Bronchitis   . Chronic hip pain   . CKD (chronic kidney disease) stage 3, GFR 30-59 ml/min   . COPD (chronic obstructive pulmonary disease) ([place])   . Essential hypertension   . Hepatitis C   . Mixed hyperlipidemia   . Sleep apnea     Past Surgical History:  Procedure Laterality Date  . CATARACT EXTRACTION Right   . COLONOSCOPY N/A 01/23/2014   SLF:The LEFT COLON IS EXTREMELY redundant/TWO RECTAL POLYPS REMOVED/SMALL VOLUME RECTAL BLEEDING MOST LIKELY DUE TO Small internal hemorrhoids  . ESOPHAGOGASTRODUODENOSCOPY N/A 01/23/2014   HQI:[id number] ring at the gastroesophageal junction/Medium sized hiatal hernia/NDYSPEPSIA  MOST LIKELY DUE TO GERD/MILD Non-erosive gastritis  . ESOPHAGOGASTRODUODENOSCOPY N/A 04/18/2018   Procedure: ESOPHAGOGASTRODUODENOSCOPY (EGD);  Surgeon: Daneil Dolin, MD;  [facility];  Service: Endoscopy;  Laterality: N/A;  . HEMORRHOID BANDING  01/23/2014   Procedure: HEMORRHOID BANDING;  Surgeon: Danie Binder, MD;  [facility];  Service: Endoscopy;;  . JOINT REPLACEMENT Right 2017  . MOUTH SURGERY    . TOTAL HIP ARTHROPLASTY Right 06/04/2016   Procedure: RIGHT TOTAL HIP ARTHROPLASTY ANTERIOR APPROACH;  Surgeon: Mcarthur Rossetti, MD;  [facility]: [facility];  Service: Orthopedics;  Laterality: Right;  . TOTAL HIP ARTHROPLASTY Left 03/02/2019   Procedure: LEFT TOTAL HIP ARTHROPLASTY ANTERIOR APPROACH;  Surgeon: Mcarthur Rossetti, MD;  [facility]: [facility];  Service: Orthopedics;  Laterality: Left;    Current Outpatient Medications  Medication Sig Dispense Refill  . acetaminophen (TYLENOL) 325 MG tablet Take 2 tablets (650 mg total) by mouth every 6 (six) hours as needed for mild pain or headache (fever >/= 101). 12 tablet 0  . amLODipine (NORVASC) 10 MG tablet Take 10 mg by mouth daily.    Marland Kitchen ascorbic acid (VITAMIN C) 500 MG tablet Take 1 tablet (500 mg total) by mouth daily. 30 tablet 1  . aspirin EC 81 MG tablet Take 81 mg by mouth daily.    . budesonide-formoterol (SYMBICORT) 160-4.5 MCG/ACT inhaler Inhale 2 puffs into the lungs 2 (two) times daily.     Marland Kitchen HYDROcodone-acetaminophen (NORCO/VICODIN) 5-325 MG tablet Take 1 tablet  by mouth every 6 (six) hours as needed for moderate pain.    . Ipratropium-Albuterol (COMBIVENT) 20-100 MCG/ACT AERS respimat Inhale 1 puff into the lungs every 6 (six) hours. 4 g 1  . loratadine (CLARITIN) 10 MG tablet Take 1 tablet (10 mg total) by mouth daily. 30 tablet 1  . PROAIR HFA 108 (90 Base) MCG/ACT inhaler Inhale 2 puffs into the lungs every 4 (four) hours as needed for wheezing or shortness of breath. 18 g 0  . zinc sulfate 220  (50 Zn) MG capsule Take 1 capsule (220 mg total) by mouth daily. 30 capsule 1   No current facility-administered medications for this visit.   Allergies:  Patient has no known allergies.   Social History: The patient  reports that he quit smoking about 5 years ago. His smoking use included cigarettes. He has never used smokeless tobacco. He reports previous alcohol use. He reports that he does not use drugs.   Family History: The patient's family history includes Lung cancer in his mother.   ROS:   Chronic bilateral hip pain.  Physical Exam: VS:  BP (!) 134/98   Pulse 85   Ht 5\' 8"  (1.727 m)   Wt 271 lb (122.9 kg)   SpO2 94%   BMI 41.21 kg/m , BMI Body mass index is 41.21 kg/m.  Wt Readings from Last 3 Encounters:  12/24/19 271 lb (122.9 kg)  11/26/19 267 lb (121.1 kg)  10/12/19 255 lb (115.7 kg)    General: Patient appears comfortable at rest. HEENT: Conjunctiva and lids normal, wearing a mask. Neck: Supple, no elevated JVP or carotid bruits, no thyromegaly. Lungs: Clear to auscultation, nonlabored breathing at rest. Cardiac: Regular rate and rhythm, no S3 or significant systolic murmur, no pericardial rub. Abdomen: Protuberant, nontender, bowel sounds present. Extremities: Trace ankle edema, distal pulses 2+. Skin: Warm and dry. Musculoskeletal: No kyphosis. Neuropsychiatric: Alert and oriented x3, affect grossly appropriate.  ECG:  An ECG dated 11/26/2019 was personally reviewed today and demonstrated:  Sinus rhythm with increased voltage.  Recent Labwork: 09/20/2019: Magnesium 1.9 09/28/2019: B Natriuretic Peptide 61.1 10/02/2019: ALT 34; AST 37 11/26/2019: BUN 14; Creatinine, Ser 1.35; Hemoglobin 13.4; Platelets 311; Potassium 3.4; Sodium 136   Other Studies Reviewed Today:  Echocardiogram 11/13/2013: Study Conclusions   - Left ventricle: The cavity size was normal. There was  severe concentric hypertrophy. Systolic function was  normal. The estimated ejection  fraction was in the range  of 60% to 65%. Wall motion was normal; there were no  regional wall motion abnormalities. There was an increased  relative contribution of atrial contraction to ventricular  filling. Doppler parameters are consistent with abnormal  left ventricular relaxation (grade 1 diastolic  dysfunction). Doppler parameters are consistent with high  ventricular filling pressure.  - Ventricular septum: Septal motion showed abnormal function  and dyssynergy.  - Aortic valve: Trivial regurgitation.  - Mitral valve: Mildly thickened leaflets . No significant  regurgitation.  - Left atrium: The atrium was mildly dilated.  - Pericardium, extracardiac: A trivial pericardial effusion  was identified.   Assessment and Plan:  1.  History of palpitations as outlined above.  I reviewed his work-up from ER visit back in March.  Plan is to obtain an echocardiogram to reevaluate cardiac structure and function, also a 7-day ZIO AT for investigation of any potential arrhythmias.  Currently not on any AV nodal blockers.  He does have LVH by ECG and hypertension.  2.  Essential hypertension, presently on  Norvasc.  3.  CKD stage IIIa, following with nephrology at this point and recent creatinine 1.35.  Medication Adjustments/Labs and Tests Ordered: Current medicines are reviewed at length with the patient today.  Concerns regarding medicines are outlined above.   Tests Ordered: Orders Placed This Encounter  Procedures  . LONG TERM MONITOR (3-14 DAYS)  . ECHOCARDIOGRAM COMPLETE    Medication Changes: No orders of the defined types were placed in this encounter.   Disposition:  Follow up test results.  Signed, Satira Sark, MD, [facility] 12/24/2019 11:00 AM    [facility] [place], [place], [place] [address] Phone: [phone]; Fax: [phone]

## 2019-12-24 ENCOUNTER — Encounter: Payer: Self-pay | Admitting: Cardiology

## 2019-12-24 ENCOUNTER — Telehealth: Payer: Self-pay | Admitting: Cardiology

## 2019-12-24 ENCOUNTER — Other Ambulatory Visit (INDEPENDENT_AMBULATORY_CARE_PROVIDER_SITE_OTHER): Payer: BC Managed Care – PPO

## 2019-12-24 ENCOUNTER — Ambulatory Visit (INDEPENDENT_AMBULATORY_CARE_PROVIDER_SITE_OTHER): Payer: BC Managed Care – PPO | Admitting: Cardiology

## 2019-12-24 ENCOUNTER — Other Ambulatory Visit: Payer: Self-pay

## 2019-12-24 VITALS — BP 134/98 | HR 85 | Ht 68.0 in | Wt 271.0 lb

## 2019-12-24 DIAGNOSIS — R0602 Shortness of breath: Secondary | ICD-10-CM

## 2019-12-24 DIAGNOSIS — R002 Palpitations: Secondary | ICD-10-CM | POA: Diagnosis not present

## 2019-12-24 DIAGNOSIS — I10 Essential (primary) hypertension: Secondary | ICD-10-CM | POA: Diagnosis not present

## 2019-12-24 NOTE — Telephone Encounter (Signed)
Pre-cert Verification for the following procedure    ECHO   DATE:  12-25-2019  LOCATION:      Checking percert also for the ZIO monitor

## 2019-12-24 NOTE — Patient Instructions (Addendum)
Medication Instructions:   Your physician recommends that you continue on your current medications as directed. Please refer to the Current Medication list given to you today.  Labwork:  NONE  Testing/Procedures: Your physician has requested that you have an echocardiogram. Echocardiography is a painless test that uses sound waves to create images of your heart. It provides your doctor with information about the size and shape of your heart and how well your heart's chambers and valves are working. This procedure takes approximately one hour. There are no restrictions for this procedure. Your physician has recommended that you wear an event monitor for 7 days. Event monitors are medical devices that record the heart's electrical activity. Doctors most often [place] these monitors to diagnose arrhythmias. Arrhythmias are problems with the speed or rhythm of the heartbeat. The monitor is a small, portable device. You can wear one while you do your normal daily activities. This is usually used to diagnose what is causing palpitations/syncope (passing out). [employer] will contact you about this monitor.   Follow-Up:  Your physician recommends that you schedule a follow-up appointment in: pending  Any Other Special Instructions Will Be Listed Below (If Applicable).  If you need a refill on your cardiac medications before your next appointment, please call your pharmacy.

## 2019-12-25 ENCOUNTER — Ambulatory Visit (HOSPITAL_COMMUNITY)
Admission: RE | Admit: 2019-12-25 | Discharge: 2019-12-25 | Disposition: A | Discharge status: Home / Self Care | Payer: BC Managed Care – PPO | Source: Ambulatory Visit | Attending: Cardiology | Admitting: Cardiology

## 2019-12-25 DIAGNOSIS — U071 COVID-19: Secondary | ICD-10-CM | POA: Diagnosis not present

## 2019-12-25 DIAGNOSIS — J449 Chronic obstructive pulmonary disease, unspecified: Secondary | ICD-10-CM | POA: Diagnosis not present

## 2019-12-25 DIAGNOSIS — R0602 Shortness of breath: Secondary | ICD-10-CM

## 2019-12-25 NOTE — Progress Notes (Signed)
*  PRELIMINARY RESULTS* Echocardiogram 2D Echocardiogram has been performed.  Mathew Lara 12/25/2019, 10:08 AM

## 2019-12-26 DIAGNOSIS — Z23 Encounter for immunization: Secondary | ICD-10-CM | POA: Diagnosis not present

## 2019-12-28 ENCOUNTER — Telehealth: Payer: Self-pay | Admitting: *Deleted

## 2019-12-28 NOTE — Telephone Encounter (Signed)
Patient informed. Copy sent to PCP °

## 2019-12-28 NOTE — Telephone Encounter (Signed)
-----   Message from Satira Sark, MD sent at 12/25/2019 10:34 AM EDT ----- Results reviewed.  LVEF is normal at 60 to 65% with significant left ventricular hypertrophy consistent with his ECG and history of hypertension.  Ascending aorta is mildly dilated with no major aortic valve disease, likely a function of hypertension.  Follow-up on cardiac monitor results.

## 2020-01-01 ENCOUNTER — Institutional Professional Consult (permissible substitution): Payer: BC Managed Care – PPO | Admitting: Pulmonary Disease

## 2020-01-11 ENCOUNTER — Telehealth: Payer: Self-pay | Admitting: *Deleted

## 2020-01-11 NOTE — Telephone Encounter (Signed)
-----   Message from Satira Sark, MD sent at 01/09/2020  6:58 PM EDT ----- Results reviewed.  Cardiac monitor did not demonstrate any significant arrhythmias.  He did have rare ectopic atrial and ventricular beats which he could potentially senses palpitations at times, but importantly no arrhythmias.  No further cardiac testing is planned at this time.  He should keep follow-up with PCP for ongoing management of hypertension.

## 2020-01-11 NOTE — Telephone Encounter (Signed)
Patient informed. Copy sent to PCP °

## 2020-01-16 ENCOUNTER — Encounter: Payer: Self-pay | Admitting: Pulmonary Disease

## 2020-01-16 ENCOUNTER — Ambulatory Visit (INDEPENDENT_AMBULATORY_CARE_PROVIDER_SITE_OTHER): Payer: Self-pay | Admitting: Pulmonary Disease

## 2020-01-16 ENCOUNTER — Other Ambulatory Visit: Payer: Self-pay

## 2020-01-16 VITALS — BP 134/68 | HR 83 | Temp 98.3°F | Ht 68.0 in | Wt 268.8 lb

## 2020-01-16 DIAGNOSIS — J849 Interstitial pulmonary disease, unspecified: Secondary | ICD-10-CM

## 2020-01-16 NOTE — Progress Notes (Signed)
Mathew Lara    [id number]    [birth year]  Primary Care Physician:Golding, Jenny Reichmann, MD  Referring Physician: [name], MD [address] [place],  [place] [address]  Chief complaint: Consult for COPD, post COVID-15  HPI: 59 year old ex-smoker with sleep apnea, chronic kidney disease, hepatitis C Hospitalized with COVID-19 in December after diagnosis on 08/29/2019.  Treated with remdesivir and steroids.  He was on high flow nasal cannula, did not require intubation.  He subsequently hospitalized twice in January 2021 for acute on chronic hypoxic respiratory failure Continues on supplemental oxygen which he uses intermittently.  Has chronic dyspnea on exertion which has worsened since her COVID-19 infection He is on Symbicort which he uses intermittently.  Pets: No pets Occupation: Works for Therapist, occupational.  Applying for disability Exposures: No known exposures.  No mold, hot tub, Jacuzzi.  No down pillows or comforters Smoking history: 40-pack-year smoker.  Quit in 2015 Travel history: Previously lived in [place].  No significant recent travel Relevant family history: No significant family history of lung disease.   Outpatient Encounter Medications as of 01/16/2020  Medication Sig  . acetaminophen (TYLENOL) 325 MG tablet Take 2 tablets (650 mg total) by mouth every 6 (six) hours as needed for mild pain or headache (fever >/= 101).  Marland Kitchen amLODipine (NORVASC) 10 MG tablet Take 10 mg by mouth daily.  Marland Kitchen ascorbic acid (VITAMIN C) 500 MG tablet Take 1 tablet (500 mg total) by mouth daily.  Marland Kitchen aspirin EC 81 MG tablet Take 81 mg by mouth daily.  . budesonide-formoterol (SYMBICORT) 160-4.5 MCG/ACT inhaler Inhale 2 puffs into the lungs 2 (two) times daily.   Marland Kitchen HYDROcodone-acetaminophen (NORCO/VICODIN) 5-325 MG tablet Take 1 tablet by mouth every 6 (six) hours as needed for moderate pain.  . Ipratropium-Albuterol (COMBIVENT) 20-100 MCG/ACT AERS respimat Inhale 1 puff  into the lungs every 6 (six) hours.  Marland Kitchen loratadine (CLARITIN) 10 MG tablet Take 1 tablet (10 mg total) by mouth daily.  Marland Kitchen PROAIR HFA 108 (90 Base) MCG/ACT inhaler Inhale 2 puffs into the lungs every 4 (four) hours as needed for wheezing or shortness of breath.  . zinc sulfate 220 (50 Zn) MG capsule Take 1 capsule (220 mg total) by mouth daily.   No facility-administered encounter medications on file as of 01/16/2020.    Allergies as of 01/16/2020  . (No Known Allergies)    Past Medical History:  Diagnosis Date  . Alcohol abuse   . Anxiety   . Bronchitis   . Chronic hip pain   . CKD (chronic kidney disease) stage 3, GFR 30-59 ml/min   . COPD (chronic obstructive pulmonary disease) ([place])   . Essential hypertension   . Hepatitis C   . Mixed hyperlipidemia   . Sleep apnea     Past Surgical History:  Procedure Laterality Date  . CATARACT EXTRACTION Right   . COLONOSCOPY N/A 01/23/2014   SLF:The LEFT COLON IS EXTREMELY redundant/TWO RECTAL POLYPS REMOVED/SMALL VOLUME RECTAL BLEEDING MOST LIKELY DUE TO Small internal hemorrhoids  . ESOPHAGOGASTRODUODENOSCOPY N/A 01/23/2014   FXT:[id number] ring at the gastroesophageal junction/Medium sized hiatal hernia/NDYSPEPSIA MOST LIKELY DUE TO GERD/MILD Non-erosive gastritis  . ESOPHAGOGASTRODUODENOSCOPY N/A 04/18/2018   Procedure: ESOPHAGOGASTRODUODENOSCOPY (EGD);  Surgeon: Daneil Dolin, MD;  [facility];  Service: Endoscopy;  Laterality: N/A;  . HEMORRHOID BANDING  01/23/2014   Procedure: HEMORRHOID BANDING;  Surgeon: Danie Binder, MD;  [facility];  Service: Endoscopy;;  .  JOINT REPLACEMENT Right 2017  . MOUTH SURGERY    . TOTAL HIP ARTHROPLASTY Right 06/04/2016   Procedure: RIGHT TOTAL HIP ARTHROPLASTY ANTERIOR APPROACH;  Surgeon: Mcarthur Rossetti, MD;  [facility];  Service: Orthopedics;  Laterality: Right;  . TOTAL HIP ARTHROPLASTY Left 03/02/2019   Procedure: LEFT TOTAL HIP ARTHROPLASTY ANTERIOR APPROACH;   Surgeon: Mcarthur Rossetti, MD;  [facility];  Service: Orthopedics;  Laterality: Left;    Family History  Problem Relation Age of Onset  . Lung cancer Mother     Social History   Socioeconomic History  . Marital status: Married    Spouse name: Not on file  . Number of children: 1  . Years of education: 43  . Highest education level: Not on file  Occupational History    Comment: [employer]  Tobacco Use  . Smoking status: Former Smoker    Types: Cigarettes    Quit date: 05/28/2014    Years since quitting: 5.6  . Smokeless tobacco: Never Used  . Tobacco comment: quit in November 2015 after smoking x 20 yrs.  Substance and Sexual Activity  . Alcohol use: Not Currently    Alcohol/week: 0.0 standard drinks  . Drug use: No  . Sexual activity: Not on file  Other Topics Concern  . Not on file  Social History Narrative       patient consumes 4 cups of caffeine daily.      Patient stopped drinking years ago.   Social Determinants of Health   Financial Resource Strain:   . Difficulty of Paying Living Expenses:   Food Insecurity:   . Worried About Charity fundraiser in the Last Year:   . Arboriculturist in the Last Year:   Transportation Needs:   . Film/video editor (Medical):   Marland Kitchen Lack of Transportation (Non-Medical):   Physical Activity:   . Days of Exercise per Week:   . Minutes of Exercise per Session:   Stress:   . Feeling of Stress :   Social Connections:   . Frequency of Communication with Friends and Family:   . Frequency of Social Gatherings with Friends and Family:   . Attends Religious Services:   . Active Member of Clubs or Organizations:   . Attends Archivist Meetings:   Marland Kitchen Marital Status:   Intimate Partner Violence:   . Fear of Current or Ex-Partner:   . Emotionally Abused:   Marland Kitchen Physically Abused:   . Sexually Abused:     Review of systems: Review of Systems  Constitutional: Negative for fever and chills.  HENT:  Negative.   Eyes: Negative for blurred vision.  Respiratory: as per HPI  Cardiovascular: Negative for chest pain and palpitations.  Gastrointestinal: Negative for vomiting, diarrhea, blood per rectum. Genitourinary: Negative for dysuria, urgency, frequency and hematuria.  Musculoskeletal: Negative for myalgias, back pain and joint pain.  Skin: Negative for itching and rash.  Neurological: Negative for dizziness, tremors, focal weakness, seizures and loss of consciousness.  Endo/Heme/Allergies: Negative for environmental allergies.  Psychiatric/Behavioral: Negative for depression, suicidal ideas and hallucinations.  All other systems reviewed and are negative.  Physical Exam: Blood pressure 134/68, pulse 83, temperature 98.3 F (36.8 C), temperature source Temporal, height 5\' 8"  (1.727 m), weight 268 lb 12.8 oz (121.9 kg), SpO2 94 %. Gen:      No acute distress HEENT:  EOMI, sclera anicteric Neck:     No masses; no thyromegaly Lungs:  Bibasal crackles CV:         Regular rate and rhythm; no murmurs Abd:      + bowel sounds; soft, non-tender; no palpable masses, no distension Ext:    No edema; adequate peripheral perfusion Skin:      Warm and dry; no rash Neuro: alert and oriented x 3 Psych: normal mood and affect  Data Reviewed: Imaging: Chest x-ray 11/26/2019-bronchial wall thickening, emphysematous changes. I reviewed the images personally.  PFTs:  Labs:  Cardiac: Echocardiogram 12/25/2019-LVEF 60-65%, severe concentric LVH, grade 1 diastolic dysfunction.  Assessment:  COPD Likely has COPD given smoking history, emphysematous changes on chest x-ray Continue Symbicort for now.  He is using it intermittently Get PFTs to confirm  Post COVID-19 Has persistent dyspnea with oxygen requirements High-res CT for further evaluation.  Plan/Recommendations: High-res CT PFTs  Marshell Garfinkel MD [facility] 01/16/2020, 11:57 AM  CC: [name], MD

## 2020-01-16 NOTE — Patient Instructions (Addendum)
We will schedule you for a high-resolution CT, pulmonary function test for further evaluation of your lungs Continue the Symbicort inhaler as you are doing now Follow-up in clinic 1 to 2 months for review of these tests

## 2020-01-31 ENCOUNTER — Ambulatory Visit
Admission: RE | Admit: 2020-01-31 | Discharge: 2020-01-31 | Disposition: A | Discharge status: Home / Self Care | Payer: 59 | Source: Ambulatory Visit | Attending: Pulmonary Disease | Admitting: Pulmonary Disease

## 2020-01-31 ENCOUNTER — Other Ambulatory Visit: Payer: Self-pay

## 2020-01-31 DIAGNOSIS — J849 Interstitial pulmonary disease, unspecified: Secondary | ICD-10-CM

## 2020-02-08 ENCOUNTER — Other Ambulatory Visit (HOSPITAL_COMMUNITY)
Admission: RE | Admit: 2020-02-08 | Discharge: 2020-02-08 | Disposition: A | Discharge status: Home / Self Care | Payer: 59 | Source: Ambulatory Visit | Attending: Pulmonary Disease | Admitting: Pulmonary Disease

## 2020-02-08 ENCOUNTER — Other Ambulatory Visit: Payer: Self-pay

## 2020-02-08 DIAGNOSIS — Z20822 Contact with and (suspected) exposure to covid-19: Secondary | ICD-10-CM | POA: Diagnosis not present

## 2020-02-08 DIAGNOSIS — Z01812 Encounter for preprocedural laboratory examination: Secondary | ICD-10-CM | POA: Diagnosis present

## 2020-02-08 LAB — SARS CORONAVIRUS 2 (TAT 6-24 HRS): SARS Coronavirus 2: NEGATIVE

## 2020-02-12 ENCOUNTER — Other Ambulatory Visit: Payer: Self-pay

## 2020-02-12 ENCOUNTER — Ambulatory Visit (HOSPITAL_COMMUNITY)
Admission: RE | Admit: 2020-02-12 | Discharge: 2020-02-12 | Disposition: A | Discharge status: Home / Self Care | Payer: 59 | Source: Ambulatory Visit | Attending: Pulmonary Disease | Admitting: Pulmonary Disease

## 2020-02-12 DIAGNOSIS — J849 Interstitial pulmonary disease, unspecified: Secondary | ICD-10-CM | POA: Diagnosis not present

## 2020-02-12 LAB — PULMONARY FUNCTION TEST
DL/VA % pred: 63 %
DL/VA: 2.69 ml/min/mmHg/L
DLCO unc % pred: 38 %
DLCO unc: 9.96 ml/min/mmHg
FEF 25-75 Post: 2.9 L/sec
FEF 25-75 Pre: 1.43 L/sec
FEF2575-%Change-Post: 103 %
FEF2575-%Pred-Post: 106 %
FEF2575-%Pred-Pre: 52 %
FEV1-%Change-Post: 15 %
FEV1-%Pred-Post: 81 %
FEV1-%Pred-Pre: 70 %
FEV1-Post: 2.37 L
FEV1-Pre: 2.04 L
FEV1FVC-%Change-Post: 2 %
FEV1FVC-%Pred-Pre: 94 %
FEV6-%Change-Post: 13 %
FEV6-%Pred-Post: 87 %
FEV6-%Pred-Pre: 77 %
FEV6-Post: 3.13 L
FEV6-Pre: 2.76 L
FEV6FVC-%Change-Post: 0 %
FEV6FVC-%Pred-Post: 103 %
FEV6FVC-%Pred-Pre: 103 %
FVC-%Change-Post: 13 %
FVC-%Pred-Post: 84 %
FVC-%Pred-Pre: 74 %
FVC-Post: 3.13 L
FVC-Pre: 2.77 L
Post FEV1/FVC ratio: 76 %
Post FEV6/FVC ratio: 100 %
Pre FEV1/FVC ratio: 74 %
Pre FEV6/FVC Ratio: 100 %
RV % pred: 94 %
RV: 2.01 L
TLC % pred: 75 %
TLC: 4.98 L

## 2020-02-12 MED ORDER — ALBUTEROL SULFATE (2.5 MG/3ML) 0.083% IN NEBU
2.5000 mg | INHALATION_SOLUTION | Freq: Once | RESPIRATORY_TRACT | Status: AC
  Administered 2020-02-12: 2.5 mg via RESPIRATORY_TRACT

## 2020-03-03 ENCOUNTER — Ambulatory Visit (INDEPENDENT_AMBULATORY_CARE_PROVIDER_SITE_OTHER): Payer: 59 | Admitting: Pulmonary Disease

## 2020-03-03 ENCOUNTER — Other Ambulatory Visit: Payer: Self-pay

## 2020-03-03 DIAGNOSIS — J9621 Acute and chronic respiratory failure with hypoxia: Secondary | ICD-10-CM | POA: Diagnosis not present

## 2020-03-03 MED ORDER — MOMETASONE FURO-FORMOTEROL FUM 200-5 MCG/ACT IN AERO: 2.0000 | INHALATION_SPRAY | Freq: Two times a day (BID) | RESPIRATORY_TRACT | 5 refills | Status: DC

## 2020-03-03 NOTE — Progress Notes (Signed)
Mathew Lara    [id number]    02-Oct-1960  Primary Care Physician:Golding, Jenny Reichmann, MD  Referring Physician: [name], Mathew Lara,  Mathew Lara [id number]   Virtual Visit via Telephone Note  I connected with Mathew Lara on 03/06/20 at 11:30 AM EDT by telephone and verified that I am speaking with the correct person using two identifiers.  Location: Patient: Home Provider: [facility], [place], [place]   I discussed the limitations, risks, security and privacy concerns of performing an evaluation and management service by telephone and the availability of in person appointments. I also discussed with the patient that there may be a patient responsible charge related to this service. The patient expressed understanding and agreed to proceed.  Chief complaint: Follow up for COPD, post COVID-59  HPI: 59 year old ex-smoker with sleep apnea, chronic kidney disease, hepatitis C Hospitalized with COVID-19 in December after diagnosis on 08/29/2019.  Treated with remdesivir and steroids.  He was on high flow nasal cannula, did not require intubation.  He subsequently hospitalized twice in January 2021 for acute on chronic hypoxic respiratory failure Continues on supplemental oxygen which he uses intermittently.  Has chronic dyspnea on exertion which has worsened since her COVID-19 infection He is on Symbicort which he uses intermittently.  Pets: No pets Occupation: Works for Therapist, occupational.  Applying for disability Exposures: No known exposures.  No mold, hot tub, Jacuzzi.  No down pillows or comforters Smoking history: 40-pack-year smoker.  Quit in 2015 Travel history: Previously lived in [place].  No significant recent travel Relevant family history: No significant family history of lung disease.  Interim history: Dyspnea slowly improving.  No acute issues  Outpatient Encounter Medications as of 03/03/2020  Medication Sig     acetaminophen (TYLENOL) 325 MG tablet Take 2 tablets (650 mg total) by mouth every 6 (six) hours as needed for mild pain or headache (fever >/= 101).   amLODipine (NORVASC) 10 MG tablet Take 10 mg by mouth daily.   ascorbic acid (VITAMIN C) 500 MG tablet Take 1 tablet (500 mg total) by mouth daily.   aspirin EC 81 MG tablet Take 81 mg by mouth daily.   DULoxetine (CYMBALTA) 30 MG capsule Take 60 mg by mouth daily.   HYDROcodone-acetaminophen (NORCO/VICODIN) 5-325 MG tablet Take 1 tablet by mouth every 6 (six) hours as needed for moderate pain.   Ipratropium-Albuterol (COMBIVENT) 20-100 MCG/ACT AERS respimat Inhale 1 puff into the lungs every 6 (six) hours.   loratadine (CLARITIN) 10 MG tablet Take 1 tablet (10 mg total) by mouth daily.   losartan (COZAAR) 25 MG tablet Take 12.5 mg by mouth daily.   oxyCODONE (OXY IR/ROXICODONE) 5 MG immediate release tablet Take 5 mg by mouth every 8 (eight) hours as needed.   PROAIR HFA 108 (90 Base) MCG/ACT inhaler Inhale 2 puffs into the lungs every 4 (four) hours as needed for wheezing or shortness of breath.   zinc sulfate 220 (50 Zn) MG capsule Take 1 capsule (220 mg total) by mouth daily.   budesonide-formoterol (SYMBICORT) 160-4.5 MCG/ACT inhaler Inhale 2 puffs into the lungs 2 (two) times daily.  (Patient not taking: Reported on 03/03/2020)   No facility-administered encounter medications on file as of 03/03/2020.   Physical Exam: televisit  Data Reviewed: Imaging: Chest x-ray 11/26/2019-bronchial wall thickening, emphysematous changes. High-res CT 01/31/2020-minimal patchy reticulation, groundglass opacities.  I have reviewed the images personally.  PFTs: 02/12/2020 FVC 3.13 [84%],  FEV1 2.37 [81%], F/F 76, TLC 4.98 [75%], DLCO 9.96 [38%] Mild obstruction with reversibility, mild restriction, moderate diffusion defect  Labs:  Cardiac: Echocardiogram 12/25/2019-LVEF 60-65%, severe concentric LVH, grade 1 diastolic  dysfunction.  Assessment:  COPD Likely has COPD given smoking history, emphysematous changes on chest x-ray Has had Dulera previously and would like to resume it.  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  I provided 25 minutes of non-face-to-face time during this encounter.  Marshell Garfinkel MD [facility] 03/03/2020, 12:01 PM  CC: [name], MD

## 2020-03-03 NOTE — Patient Instructions (Signed)
We will send in prescription for [facility].  Start using this on a regular basis. Follow-up in 3 months.

## 2020-03-25 ENCOUNTER — Other Ambulatory Visit: Payer: Self-pay | Admitting: Neurology

## 2020-03-25 ENCOUNTER — Other Ambulatory Visit (HOSPITAL_COMMUNITY): Payer: Self-pay | Admitting: Neurology

## 2020-03-25 DIAGNOSIS — M545 Low back pain, unspecified: Secondary | ICD-10-CM

## 2020-04-14 ENCOUNTER — Ambulatory Visit (HOSPITAL_COMMUNITY): Payer: 59

## 2020-04-30 ENCOUNTER — Ambulatory Visit (HOSPITAL_COMMUNITY): Payer: 59

## 2020-05-07 IMAGING — US US RENAL
1 series · 14 of 25 positions shown · non-contrast
Comparison: None.

CLINICAL DATA: Acute tubular necrosis

EXAM:
RENAL / URINARY TRACT ULTRASOUND COMPLETE

[Series 1: us renal · 0.23mm/px · 14 of 27 slices shown]
[im 1/27]
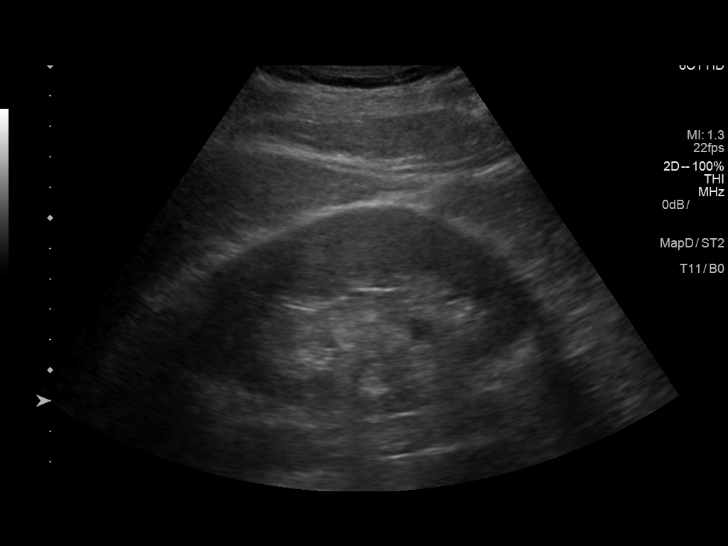
[im 3/27]
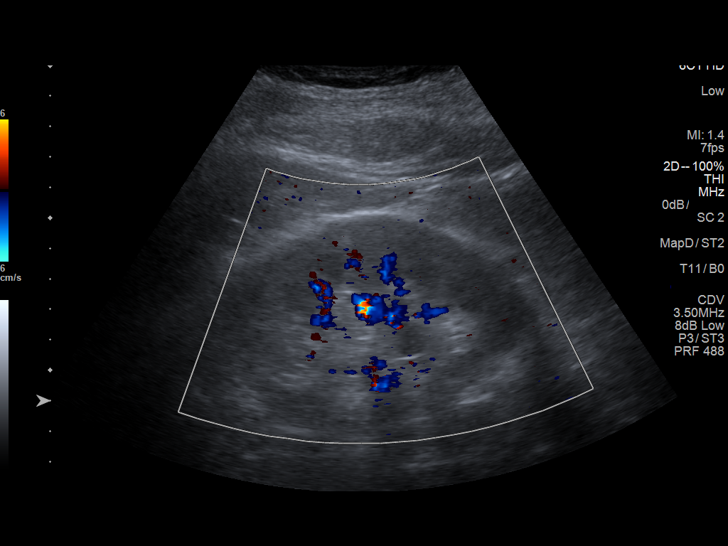
[im 5/27]
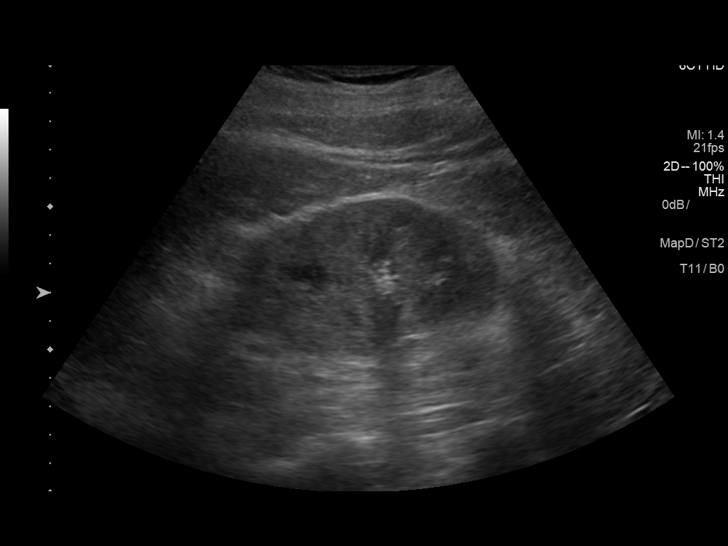
[im 7/27]
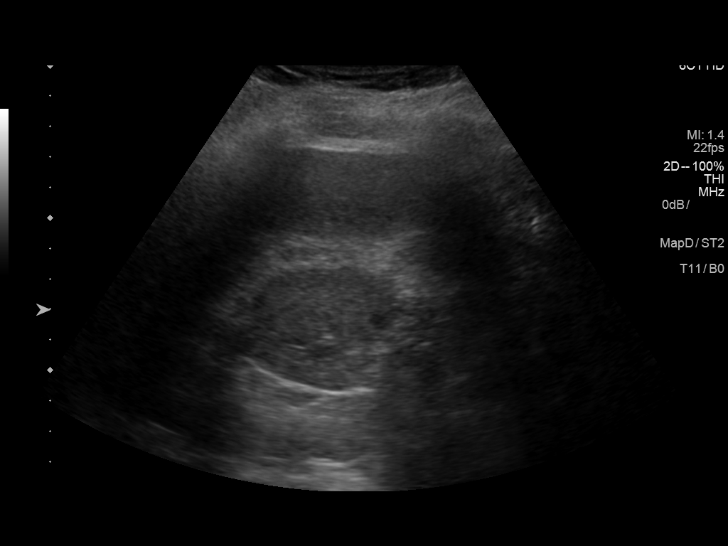
[im 9/27]
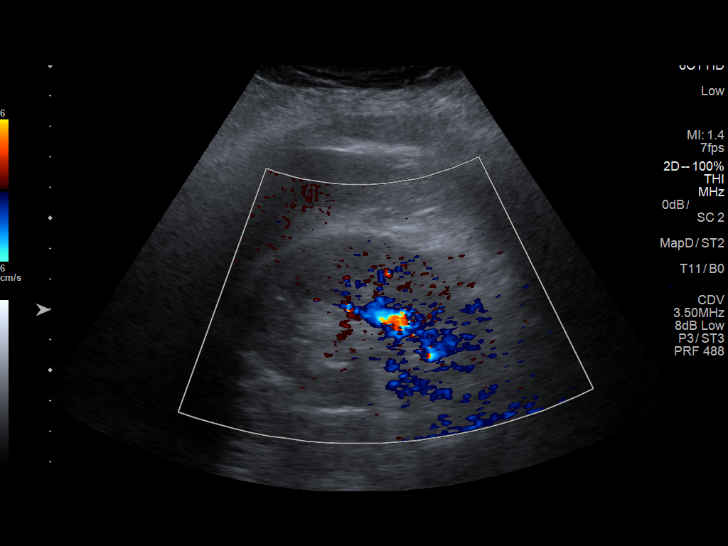
[im 10/27]
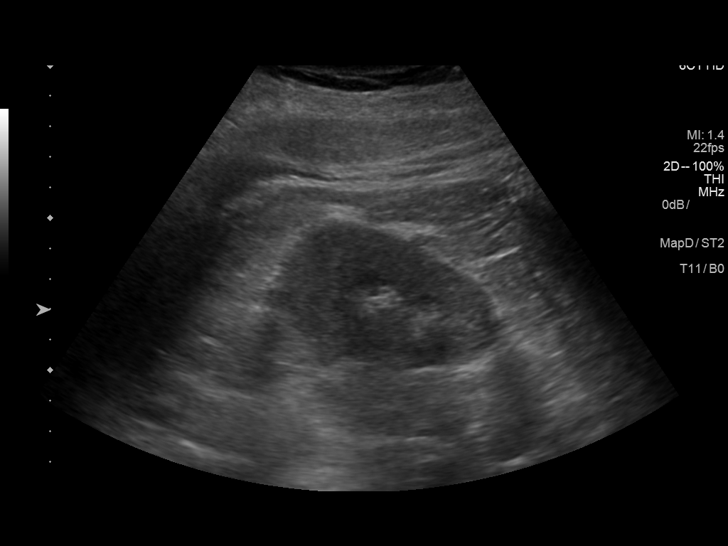
[im 12/27]
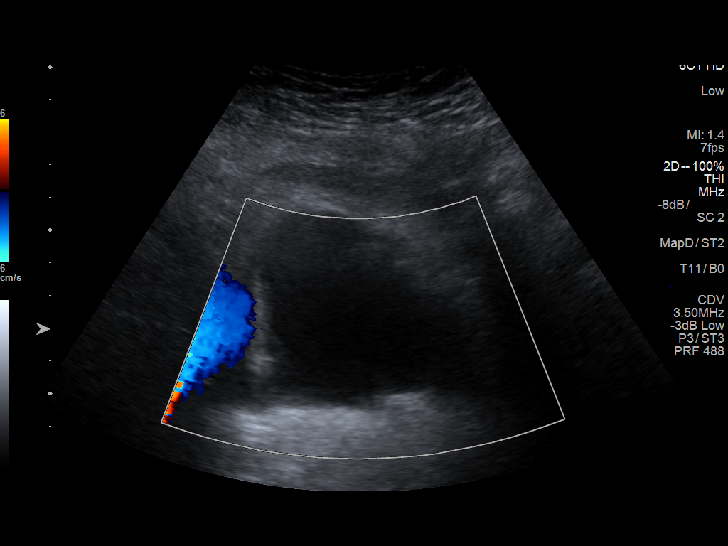
[im 15/27]
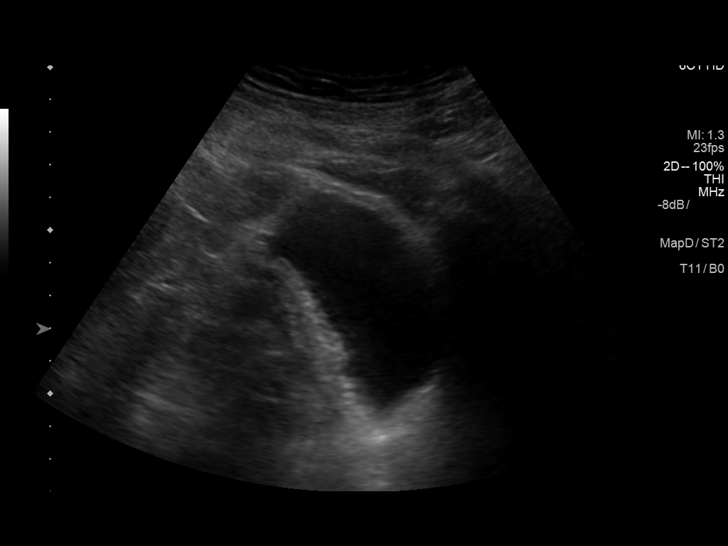
[im 17/27]
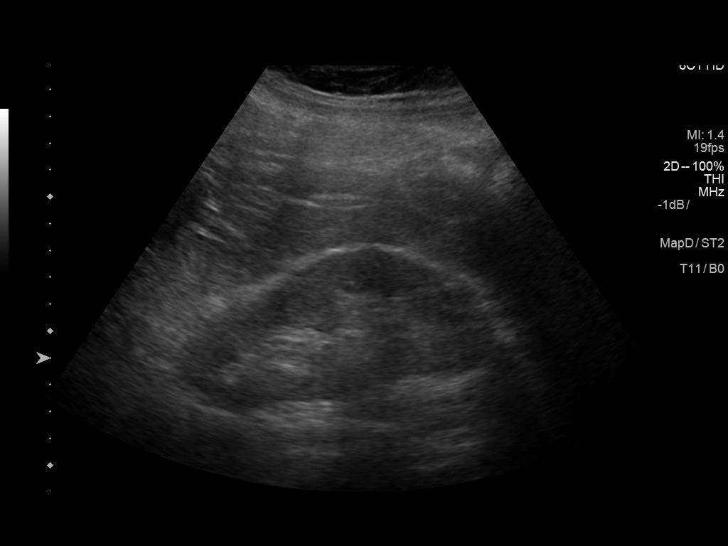
[im 18/27]
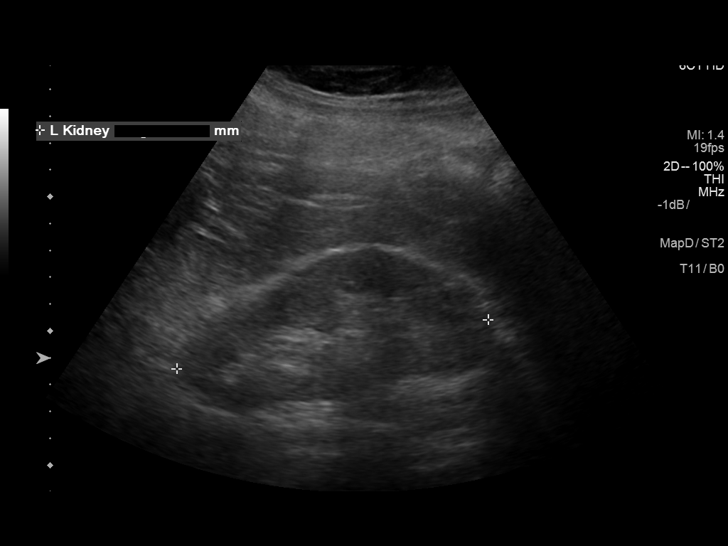
[im 20/27]
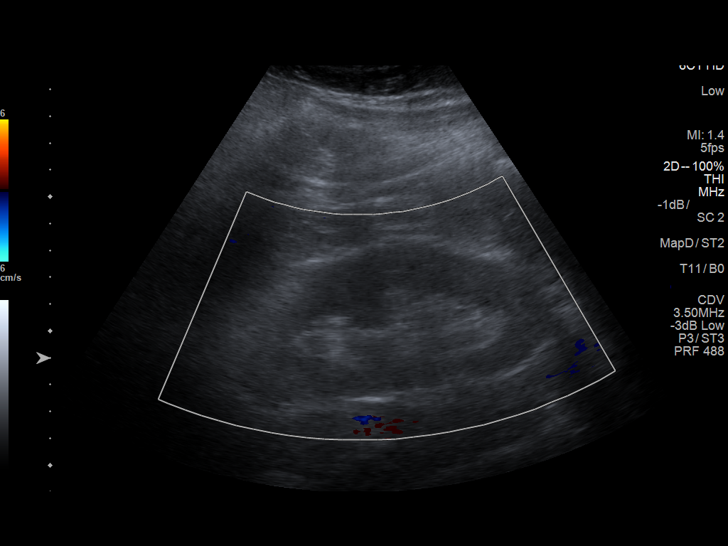
[im 22/27]
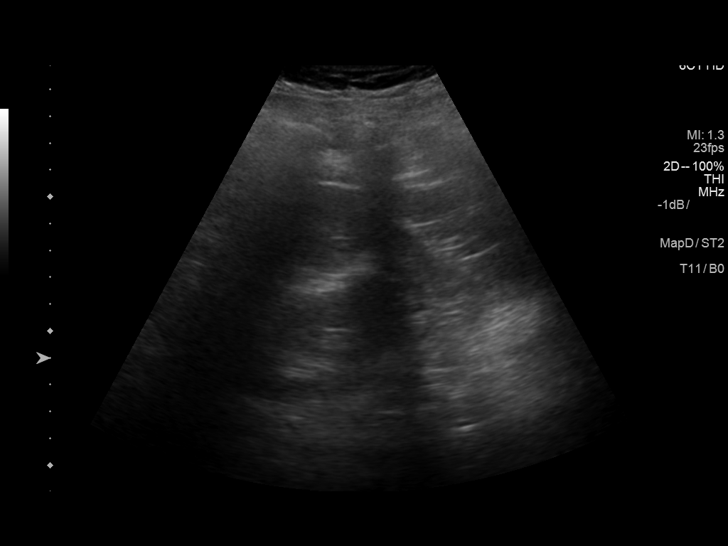
[im 24/27]
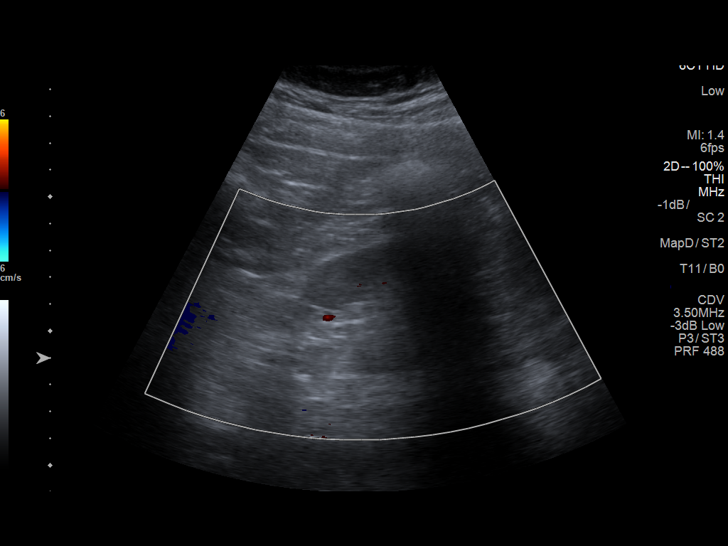
[im 27/27]
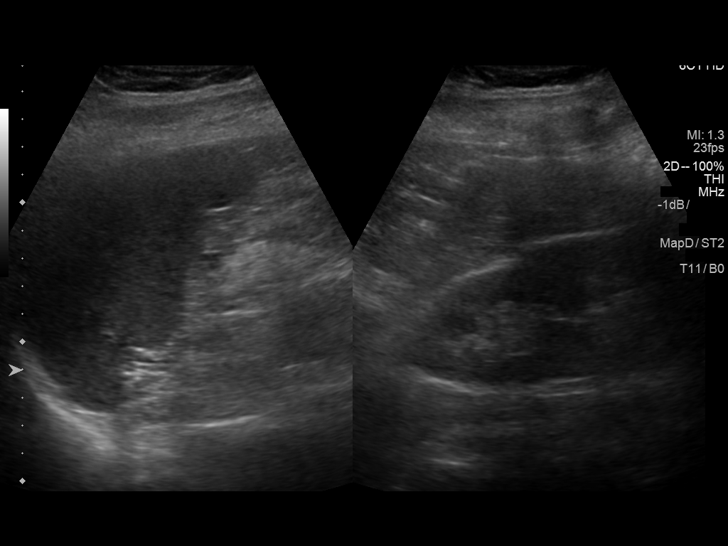

[14 of 25 positions shown; findings below may reference images not displayed]

FINDINGS: Right Kidney:

Length: 11.5 cm. Echogenicity within normal limits. No mass or
hydronephrosis visualized.

Left Kidney:

Length: 11.8 cm. Echogenicity within normal limits. No mass or
hydronephrosis visualized.

Bladder:

Appears normal for degree of bladder distention.
IMPRESSION: No acute abnormality noted.

## 2020-05-14 ENCOUNTER — Other Ambulatory Visit: Payer: Self-pay

## 2020-05-14 ENCOUNTER — Ambulatory Visit (HOSPITAL_COMMUNITY)
Admission: RE | Admit: 2020-05-14 | Discharge: 2020-05-14 | Disposition: A | Discharge status: Home / Self Care | Payer: 59 | Source: Ambulatory Visit | Attending: Neurology | Admitting: Neurology

## 2020-05-14 DIAGNOSIS — M545 Low back pain, unspecified: Secondary | ICD-10-CM

## 2020-06-03 ENCOUNTER — Ambulatory Visit: Payer: 59 | Admitting: Pulmonary Disease

## 2020-06-03 ENCOUNTER — Other Ambulatory Visit: Payer: Self-pay

## 2020-06-03 ENCOUNTER — Encounter: Payer: Self-pay | Admitting: Pulmonary Disease

## 2020-06-03 VITALS — BP 138/82 | HR 87 | Temp 97.3°F | Ht 68.0 in | Wt 286.6 lb

## 2020-06-03 DIAGNOSIS — Z23 Encounter for immunization: Secondary | ICD-10-CM | POA: Diagnosis not present

## 2020-06-03 DIAGNOSIS — Z87891 Personal history of nicotine dependence: Secondary | ICD-10-CM

## 2020-06-03 DIAGNOSIS — J449 Chronic obstructive pulmonary disease, unspecified: Secondary | ICD-10-CM

## 2020-06-03 DIAGNOSIS — G4733 Obstructive sleep apnea (adult) (pediatric): Secondary | ICD-10-CM

## 2020-06-03 DIAGNOSIS — J849 Interstitial pulmonary disease, unspecified: Secondary | ICD-10-CM | POA: Diagnosis not present

## 2020-06-03 DIAGNOSIS — Z0000 Encounter for general adult medical examination without abnormal findings: Secondary | ICD-10-CM | POA: Insufficient documentation

## 2020-06-03 DIAGNOSIS — Z8616 Personal history of COVID-19: Secondary | ICD-10-CM | POA: Diagnosis not present

## 2020-06-03 DIAGNOSIS — R0902 Hypoxemia: Secondary | ICD-10-CM

## 2020-06-03 DIAGNOSIS — Z9989 Dependence on other enabling machines and devices: Secondary | ICD-10-CM

## 2020-06-03 NOTE — Assessment & Plan Note (Signed)
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

## 2020-06-03 NOTE — Assessment & Plan Note (Signed)
Walked in office today, completed 1 lap with forehead probe, oxygen levels dropped to 88%, patient is not interested in starting oxygen with physical exertion at this point in time  Plan: We will continue to clinically monitor Referral to pulmonary rehab We will have patient establish with a pulmonary [place] clinic in 2 to 3 months

## 2020-06-03 NOTE — Progress Notes (Signed)
@Patient  ID: Mathew Lara, male    DOB: 07/30/1961, 59 y.o.   MRN: [id number]  Chief Complaint  Patient presents with  . 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 Quit 2015. 42 pack year smoker.  Maintenance:  Dulera 200 Pt of: Dr. Vaughan Browner  06/03/2020  - Visit   59 year old male former smoker followed in our office for interstitial lung disease.  Typically followed by Dr. Vaughan Browner.  Patient was last seen in June/2021.  Plan of care from that office visit was for the patient to remain on Dulera.  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    He was encouraged to increase his exercise regimen.  Patient presented to our office today reporting continued shortness of breath with physical exertion.  He reports adherence to [facility] 200.  He has uses Combivent 2 times a week.  Patient reports that he also uses a CPAP for obstructive sleep apnea is managed by primary care.  We do not have the sleep study on file.  Patient is wondering if he can establish with our office in [place], [place] as this would be easier for him.  We will discuss this today.  He would like the flu vaccine today.   Questionaires / Pulmonary Flowsheets:   ACT:  No flowsheet data found.  MMRC: mMRC Dyspnea Scale mMRC Score  06/03/2020 3  01/16/2020 1    Epworth:  No flowsheet data found.  Tests:   Imaging: Chest x-ray 11/26/2019-bronchial wall thickening, emphysematous changes. High-res CT 01/31/2020-minimal patchy reticulation, groundglass opacities.   PFTs: 02/12/2020 FVC 3.13 [84%], FEV1 2.37 [81%], F/F 76, TLC 4.98 [75%], DLCO 9.96 [38%] Mild  obstruction with reversibility, mild restriction, moderate diffusion defect  FENO:  No results found for: NITRICOXIDE  PFT: PFT Results Latest Ref Rng & Units 02/12/2020  FVC-Pre L 2.77  FVC-Predicted Pre % 74  FVC-Post L 3.13  FVC-Predicted Post % 84  Pre FEV1/FVC % % 74  Post FEV1/FCV % % 76  FEV1-Pre L 2.04  FEV1-Predicted Pre % 70  FEV1-Post L 2.37  DLCO uncorrected ml/min/mmHg 9.96  DLCO UNC% % 38  DLVA Predicted % 63  TLC L 4.98  TLC % Predicted % 75  RV % Predicted % 94    WALK:  No flowsheet data found.  Imaging: MR LUMBAR SPINE WO CONTRAST  Result Date: 05/14/2020 CLINICAL DATA:  59 year old male with persistent low back pain. No known injury. EXAM: MRI LUMBAR SPINE WITHOUT CONTRAST TECHNIQUE: Multiplanar, multisequence MR imaging of the lumbar spine was performed. No intravenous contrast was administered. COMPARISON:  Lumbar radiographs 11/19/2015. FINDINGS: Segmentation:  Normal on the comparison radiographs. Alignment: Mild straightening of lumbar lordosis compared to 2017. No spondylolisthesis. Vertebrae: No marrow edema or evidence of acute osseous abnormality. Visualized bone marrow signal is within normal limits. Intact visible sacrum and SI joints. Conus medullaris and cauda equina: Conus extends to the L1-L2 level. No lower spinal cord or conus signal abnormality. Paraspinal and other soft tissues: Negative. Disc levels: T11-T12: Negative. T12-L1:  Negative. L1-L2:  Negative. L2-L3:  Subtle disc desiccation and disc bulging. No stenosis. L3-L4: Mild disc desiccation and mild foraminal/far lateral disc  bulging. Mild facet hypertrophy. No spinal or lateral recess stenosis. Mild bilateral L3 foraminal stenosis. L4-L5: Mild disc desiccation, foraminal and far lateral disc bulging. Mild associated endplate spurring. Mild to moderate facet hypertrophy greater on the left. Trace left facet joint fluid. No spinal or lateral recess stenosis. Moderate left and mild right L4  foraminal stenosis. L5-S1: Tiny central disc protrusion with annular fissure best seen on series 9, image 9. Mild far lateral disc bulging and endplate spurring. No associated spinal or lateral recess stenosis. Mild multifactorial bilateral L5 foraminal stenosis. IMPRESSION: 1. Generally mild for age lumbar spine degeneration with no spinal or lateral recess stenosis. 2. Small disc herniation at L5-S1 without stenosis. Foraminal and far lateral disc bulging, combined with endplate spurring and facet hypertrophy at L4-L5 results in moderate left L4 neural foraminal stenosis. Electronically Signed   By: Genevie Ann M.D.   On: 05/14/2020 14:53    Lab Results:  CBC    Component Value Date/Time   WBC 10.7 (H) 11/26/2019 1316   RBC 4.95 11/26/2019 1316   HGB 13.4 11/26/2019 1316   HCT 42.8 11/26/2019 1316   PLT 311 11/26/2019 1316   MCV 86.5 11/26/2019 1316   MCH 27.1 11/26/2019 1316   MCHC 31.3 11/26/2019 1316   RDW 18.0 (H) 11/26/2019 1316   LYMPHSABS 0.9 10/02/2019 0422   MONOABS 0.6 10/02/2019 0422   EOSABS 0.0 10/02/2019 0422   BASOSABS 0.1 10/02/2019 0422    BMET    Component Value Date/Time   NA 136 11/26/2019 1316   K 3.4 (L) 11/26/2019 1316   CL 98 11/26/2019 1316   CO2 25 11/26/2019 1316   GLUCOSE 129 (H) 11/26/2019 1316   BUN 14 11/26/2019 1316   CREATININE 1.35 (H) 11/26/2019 1316   CALCIUM 9.7 11/26/2019 1316   GFRNONAA 57 (L) 11/26/2019 1316   GFRAA >60 11/26/2019 1316    BNP    Component Value Date/Time   BNP 61.1 09/28/2019 1237    ProBNP No results found for: PROBNP  Specialty Problems      Pulmonary Problems   COPD (chronic obstructive pulmonary disease) (HCC)   Acute on chronic respiratory failure with hypoxia (HCC)   COPD with acute exacerbation (HCC)   ILD (interstitial lung disease) (HCC)   OSA on CPAP    Currently managed by PCP         No Known Allergies  Immunization History  Administered Date(s) Administered  . Influenza, High Dose  Seasonal PF 06/03/2020  . Moderna SARS-COVID-2 Vaccination 12/01/2019, 12/22/2019    Past Medical History:  Diagnosis Date  . Alcohol abuse   . Anxiety   . Bronchitis   . Chronic hip pain   . CKD (chronic kidney disease) stage 3, GFR 30-59 ml/min   . COPD (chronic obstructive pulmonary disease) ([place])   . Essential hypertension   . Hepatitis C   . Mixed hyperlipidemia   . Sleep apnea     Tobacco History: Social History   Tobacco Use  Smoking Status Former Smoker  . Packs/day: 1.00  . Years: 42.00  . Pack years: 42.00  . Types: Cigarettes  . Start date: 09/07/1971  . Quit date: 05/28/2014  . Years since quitting: 6.0  Smokeless Tobacco Never Used  Tobacco Comment   quit in November 2015 after smoking x 20 yrs.   Counseling given: Yes Comment: quit in November 2015 after smoking x 20 yrs.   Continue to not smoke  Outpatient Encounter Medications as of 06/03/2020  Medication Sig  . acetaminophen (TYLENOL) 325 MG tablet Take 2 tablets (650 mg total) by mouth every 6 (six) hours as needed for mild pain or headache (fever >/= 101).  Marland Kitchen amLODipine (NORVASC) 10 MG tablet Take 10 mg by mouth daily.  Marland Kitchen ascorbic acid (VITAMIN C) 500 MG tablet Take 1 tablet (500 mg total) by mouth daily.  Marland Kitchen aspirin EC 81 MG tablet Take 81 mg by mouth daily.  . budesonide-formoterol (SYMBICORT) 160-4.5 MCG/ACT inhaler Inhale 2 puffs into the lungs 2 (two) times daily.  (Patient not taking: Reported on 03/03/2020)  . DULoxetine (CYMBALTA) 30 MG capsule Take 60 mg by mouth daily.  Marland Kitchen HYDROcodone-acetaminophen (NORCO/VICODIN) 5-325 MG tablet Take 1 tablet by mouth every 6 (six) hours as needed for moderate pain.  . Ipratropium-Albuterol (COMBIVENT) 20-100 MCG/ACT AERS respimat Inhale 1 puff into the lungs every 6 (six) hours.  Marland Kitchen loratadine (CLARITIN) 10 MG tablet Take 1 tablet (10 mg total) by mouth daily.  Marland Kitchen losartan (COZAAR) 25 MG tablet Take 12.5 mg by mouth daily.  . mometasone-formoterol (DULERA)  200-5 MCG/ACT AERO Inhale 2 puffs into the lungs 2 (two) times daily.  Marland Kitchen oxyCODONE (OXY IR/ROXICODONE) 5 MG immediate release tablet Take 5 mg by mouth every 8 (eight) hours as needed.  Marland Kitchen PROAIR HFA 108 (90 Base) MCG/ACT inhaler Inhale 2 puffs into the lungs every 4 (four) hours as needed for wheezing or shortness of breath.  . zinc sulfate 220 (50 Zn) MG capsule Take 1 capsule (220 mg total) by mouth daily.   No facility-administered encounter medications on file as of 06/03/2020.     Review of Systems  Review of Systems  Constitutional: Positive for fatigue. Negative for activity change, chills, fever and unexpected weight change.  HENT: Positive for congestion (occasionally green mucous ) and postnasal drip. Negative for rhinorrhea, sinus pressure, sinus pain and sore throat.   Eyes: Negative.   Respiratory: Positive for cough (dry cough ) and shortness of breath. Negative for wheezing.   Cardiovascular: Negative for chest pain and palpitations.  Gastrointestinal: Negative for constipation, diarrhea, nausea and vomiting.  Endocrine: Negative.   Genitourinary: Negative.   Musculoskeletal: Negative.   Skin: Negative.   Neurological: Negative for dizziness and headaches.  Psychiatric/Behavioral: Negative.  Negative for dysphoric mood. The patient is not nervous/anxious.   All other systems reviewed and are negative.    Physical Exam  BP 138/82 (BP Location: Left Arm, Cuff Size: Normal)   Pulse 87   Temp (!) 97.3 F (36.3 C) (Oral)   Ht 5\' 8"  (1.727 m)   Wt 286 lb 9.6 oz (130 kg)   SpO2 97%   BMI 43.58 kg/m   Wt Readings from Last 5 Encounters:  06/03/20 286 lb 9.6 oz (130 kg)  01/16/20 268 lb 12.8 oz (121.9 kg)  12/24/19 271 lb (122.9 kg)  11/26/19 267 lb (121.1 kg)  10/12/19 255 lb (115.7 kg)    BMI Readings from Last 5 Encounters:  06/03/20 43.58 kg/m  01/16/20 40.87 kg/m  12/24/19 41.21 kg/m  11/26/19 40.60 kg/m  10/12/19 38.77 kg/m     Physical  Exam Vitals and nursing note reviewed.  Constitutional:      General: He is not in acute distress.    Appearance: Normal appearance. He is obese.  HENT:     Head: Normocephalic and atraumatic.     Right Ear: Hearing and external ear normal.     Left Ear: Hearing and external ear normal.  Nose: Rhinorrhea present. No mucosal edema.     Right Turbinates: Not enlarged.     Left Turbinates: Not enlarged.     Mouth/Throat:     Mouth: Mucous membranes are dry.     Pharynx: Oropharynx is clear. No oropharyngeal exudate.  Eyes:     Pupils: Pupils are equal, round, and reactive to light.  Cardiovascular:     Rate and Rhythm: Normal rate and regular rhythm.     Pulses: Normal pulses.     Heart sounds: Normal heart sounds. No murmur heard.   Pulmonary:     Effort: Pulmonary effort is normal.     Breath sounds: Rales (Left lower lobe rales) present. No decreased breath sounds or wheezing.  Musculoskeletal:     Cervical back: Normal range of motion.     Right lower leg: No edema.     Left lower leg: No edema.  Lymphadenopathy:     Cervical: No cervical adenopathy.  Skin:    General: Skin is warm and dry.     Capillary Refill: Capillary refill takes less than 2 seconds.     Findings: No erythema or rash.  Neurological:     General: No focal deficit present.     Mental Status: He is alert and oriented to person, place, and time.     Motor: No weakness.     Coordination: Coordination normal.     Gait: Gait is intact. Gait normal.  Psychiatric:        Mood and Affect: Mood normal.        Behavior: Behavior normal. Behavior is cooperative.        Thought Content: Thought content normal.        Judgment: Judgment normal.       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 Reviewed most recent pulmonary function test with patient today in clinic  Plan: Walk today in office We will have patient establish with [facility] in 3  months Can consider repeat high-resolution CT chest in May/2022 Can consider repeating pulmonary function testing based off of patient's clinical status Encourage patient to increase overall physical activity We will refer to pulmonary rehab at [facility] today  COPD (chronic obstructive pulmonary disease) ([place]) Plan: Walk today in office, patient completed 1 lap with oxygen levels dropping to 88% Continue Dulera 200 Continue Combivent Referral to pulmonary rehab  History of COVID-19 Plan: We will continue clinically monitor Referral to pulmonary rehab  Former smoker Plan: Can consider referral to lung cancer screening program after repeat high-resolution CT chest in May/2022   Exercise hypoxemia Walked in office today, completed 1 lap with forehead probe, oxygen levels dropped to 88%, patient is not interested in starting oxygen with physical exertion at this point in time  Plan: We will continue to clinically monitor Referral to pulmonary rehab We will have patient establish with a pulmonary [place] clinic in 2 to 3 months  OSA on CPAP Patient reporting he has a CPAP at home for management of sleep apnea He reports his management PCP  Plan: Continue CPAP therapy  Healthcare maintenance Plan: Flu vaccine today    Return in about 2 months (around 08/03/2020), or if symptoms worsen or fail to improve, for [facility], Follow up with Dr. Halford Chessman, Follow up with Dr. Elsworth Soho.   Lauraine Rinne, NP 06/03/2020   This appointment required 42 minutes of patient care (this includes precharting, chart review, review of results,  face-to-face care, etc.).

## 2020-06-03 NOTE — Assessment & Plan Note (Signed)
Plan: Walk today in office, patient completed 1 lap with oxygen levels dropping to 88% Continue Dulera 200 Continue Combivent Referral to pulmonary rehab

## 2020-06-03 NOTE — Assessment & Plan Note (Signed)
Plan: Can consider referral to lung cancer screening program after repeat high-resolution CT chest in May/2022

## 2020-06-03 NOTE — Patient Instructions (Addendum)
You were seen today by Lauraine Rinne, NP  for:   1. ILD (interstitial lung disease) ([place])  Walk today in office  We will plan on repeating high-resolution CT chest in May/2022  2. Chronic obstructive pulmonary disease, unspecified COPD type ([place])  Dulera 200 >>> 2 puffs in the morning right when you wake up, rinse out your mouth after use, 12 hours later 2 puffs, rinse after use >>> Take this daily, no matter what >>> This is not a rescue inhaler   Continue Combivent every 6-8 hours as needed for shortness of breath or wheezing  Flu vaccine today  3. Former smoker  With your history of smoking you would qualify for the lung cancer screening program.  We can consider this after you obtain your next high-resolution CT chest in May/2022  4. History of COVID-19  Up-to-date with COVID-19 vaccinations   Follow Up:    Return in about 2 months (around 08/03/2020), or if symptoms worsen or fail to improve, for [facility], Follow up with Dr. Halford Chessman, Follow up with Dr. Elsworth Soho. Please schedule patient with either Dr. Halford Chessman or Dr. Elsworth Soho and 30-minute time slot at [facility] clinic   Notification of test results are managed in the following manner: If there are  any recommendations or changes to the  plan of care discussed in office today,  we will contact you and let you know what they are. If you do not hear from [place], then your results are normal and you can view them through your  MyChart account , or a letter will be sent to you. Thank you again for trusting [place] with your care  - Thank you, [facility]    It is flu season:   >>> Best ways to protect herself from the flu: Receive the yearly flu vaccine, practice good hand hygiene washing with soap and also using hand sanitizer when available, eat a nutritious meals, get adequate rest, hydrate appropriately       Please contact the office if your symptoms worsen or you have concerns that you are not improving.    Thank you for choosing [facility] for your healthcare, and for allowing [place] to partner with you on your healthcare journey. I am thankful to be able to provide care to you today.   Wyn Quaker FNP-C

## 2020-06-03 NOTE — Assessment & Plan Note (Signed)
Patient reporting he has a CPAP at home for management of sleep apnea He reports his management PCP  Plan: Continue CPAP therapy

## 2020-06-03 NOTE — Assessment & Plan Note (Signed)
Plan: °Flu vaccine today °

## 2020-06-03 NOTE — Assessment & Plan Note (Signed)
Plan: We will continue clinically monitor Referral to pulmonary rehab

## 2020-06-04 NOTE — Progress Notes (Signed)
Reviewed and agree with assessment/plan.   Geremy Rister, MD  Pulmonary/Critical Care 06/04/2020, 8:38 AM Pager:  [phone]  

## 2020-06-13 ENCOUNTER — Telehealth: Payer: Self-pay | Admitting: Pulmonary Disease

## 2020-06-13 ENCOUNTER — Telehealth: Payer: Self-pay | Admitting: Internal Medicine

## 2020-06-13 NOTE — Telephone Encounter (Signed)
Lm for patient's daughter, Reeves Forth.

## 2020-06-13 NOTE — Telephone Encounter (Signed)
Lm for patient's daughter, Mathew Lara ([place]).

## 2020-06-13 NOTE — Telephone Encounter (Signed)
Spoke with the pt's Mathew Lara  She states Dulera not covered and asking for alternative  I asked that pt call ins to check on preferred meds  She asked if we could just pick one comparable to [facility] and then she will call and see if that it covered  Please advise thanks

## 2020-06-13 NOTE — Telephone Encounter (Signed)
Patient's daughter is returning phone call. [place] phone number is [phone].

## 2020-06-13 NOTE — Telephone Encounter (Signed)
Mathew Lara returning a phone call. Reeves Forth can be reached at [phone]

## 2020-06-16 MED ORDER — BUDESONIDE-FORMOTEROL FUMARATE 80-4.5 MCG/ACT IN AERO: 2.0000 | INHALATION_SPRAY | Freq: Two times a day (BID) | RESPIRATORY_TRACT | 12 refills | Status: DC

## 2020-06-16 NOTE — Telephone Encounter (Signed)
Error

## 2020-06-16 NOTE — Telephone Encounter (Signed)
Called and spoke with pt's daughter [name] letting her know that Dr. Vaughan Browner was switching pt to Symb 37 and she verbalized understanding. Verified preferred pharmacy and sent Rx in for pt. Nothing further needed.

## 2020-06-16 NOTE — Telephone Encounter (Signed)
Send in prescription for Symbicort 80/4.5

## 2020-07-29 ENCOUNTER — Ambulatory Visit: Payer: 59 | Admitting: Pulmonary Disease

## 2020-08-07 ENCOUNTER — Other Ambulatory Visit: Payer: Self-pay

## 2020-08-07 ENCOUNTER — Encounter (HOSPITAL_COMMUNITY)
Admission: RE | Admit: 2020-08-07 | Discharge: 2020-08-07 | Disposition: A | Discharge status: Home / Self Care | Payer: 59 | Source: Ambulatory Visit | Attending: Pulmonary Disease | Admitting: Pulmonary Disease

## 2020-08-07 ENCOUNTER — Encounter (HOSPITAL_COMMUNITY): Payer: Self-pay

## 2020-08-07 VITALS — BP 112/70 | HR 104 | Ht 68.0 in | Wt 296.3 lb

## 2020-08-07 DIAGNOSIS — J849 Interstitial pulmonary disease, unspecified: Secondary | ICD-10-CM | POA: Diagnosis not present

## 2020-08-07 NOTE — Progress Notes (Signed)
Pulmonary Individual Treatment Plan  Patient Details  Name: Mathew Lara MRN: [id number] Date of Birth: 06/12/1961 Referring Provider:     PULMONARY REHAB OTHER RESP ORIENTATION from 08/07/2020 in [place]  Referring Provider Dr. Vaughan Browner       Initial Encounter Date:    [place] from 08/07/2020 in [facility]  Date 08/07/20      Visit Diagnosis: Interstitial lung disease ([place])  Patient's Home Medications on Admission:   Current Outpatient Medications:  .  hydrOXYzine (ATARAX/VISTARIL) 25 MG tablet, Take 25 mg by mouth 3 (three) times daily as needed for anxiety., Disp: , Rfl:  .  sodium bicarbonate 650 MG tablet, Take 650 mg by mouth 3 (three) times daily., Disp: , Rfl:  .  acetaminophen (TYLENOL) 325 MG tablet, Take 2 tablets (650 mg total) by mouth every 6 (six) hours as needed for mild pain or headache (fever >/= 101)., Disp: 12 tablet, Rfl: 0 .  amLODipine (NORVASC) 10 MG tablet, Take 10 mg by mouth daily., Disp: , Rfl:  .  ascorbic acid (VITAMIN C) 500 MG tablet, Take 1 tablet (500 mg total) by mouth daily. (Patient not taking: Reported on 08/07/2020), Disp: 30 tablet, Rfl: 1 .  aspirin EC 81 MG tablet, Take 81 mg by mouth daily., Disp: , Rfl:  .  budesonide-formoterol (SYMBICORT) 80-4.5 MCG/ACT inhaler, Inhale 2 puffs into the lungs in the morning and at bedtime. (Patient not taking: Reported on 08/07/2020), Disp: 10.2 g, Rfl: 12 .  DULoxetine (CYMBALTA) 30 MG capsule, Take 60 mg by mouth daily. (Patient not taking: Reported on 08/07/2020), Disp: , Rfl:  .  HYDROcodone-acetaminophen (NORCO/VICODIN) 5-325 MG tablet, Take 1 tablet by mouth every 6 (six) hours as needed for moderate pain. (Patient not taking: Reported on 08/07/2020), Disp: , Rfl:  .  Ipratropium-Albuterol (COMBIVENT) 20-100 MCG/ACT AERS respimat, Inhale 1 puff into the lungs every 6 (six) hours. (Patient not taking: Reported on 08/07/2020),  Disp: 4 g, Rfl: 1 .  loratadine (CLARITIN) 10 MG tablet, Take 1 tablet (10 mg total) by mouth daily., Disp: 30 tablet, Rfl: 1 .  losartan (COZAAR) 25 MG tablet, Take 25 mg by mouth daily. , Disp: , Rfl:  .  oxyCODONE (OXY IR/ROXICODONE) 5 MG immediate release tablet, Take 5 mg by mouth every 8 (eight) hours as needed., Disp: , Rfl:  .  PROAIR HFA 108 (90 Base) MCG/ACT inhaler, Inhale 2 puffs into the lungs every 4 (four) hours as needed for wheezing or shortness of breath., Disp: 18 g, Rfl: 0 .  zinc sulfate 220 (50 Zn) MG capsule, Take 1 capsule (220 mg total) by mouth daily. (Patient not taking: Reported on 08/07/2020), Disp: 30 capsule, Rfl: 1  Past Medical History: Past Medical History:  Diagnosis Date  . Alcohol abuse   . Anxiety   . Bronchitis   . Chronic hip pain   . CKD (chronic kidney disease) stage 3, GFR 30-59 ml/min (HCC)   . COPD (chronic obstructive pulmonary disease) ([place])   . Essential hypertension   . Hepatitis C   . Mixed hyperlipidemia   . Sleep apnea     Tobacco Use: Social History   Tobacco Use  Smoking Status Former Smoker  . Packs/day: 1.00  . Years: 42.00  . Pack years: 42.00  . Types: Cigarettes  . Start date: 09/07/1971  . Quit date: 05/28/2014  . Years since quitting: 6.2  Smokeless Tobacco Never Used  Tobacco Comment  quit in November 2015 after smoking x 20 yrs.    Labs: Recent Review Flowsheet Data    Labs for ITP Cardiac and Pulmonary Rehab Latest Ref Rng & Units 08/29/2019 09/28/2019   Trlycerides <150 mg/dL 199(H) -   PHART 7.35 - 7.45 7.393 7.343(L)    PCO2ART 32 - 48 mmHg 40.5 45.5    HCO3 20.0 - 28.0 mmol/L 24.2 24.1    ACIDBASEDEF 0.0 - 2.0 mmol/L 0.1 1.3    O2SAT % 90.7 94.1       Capillary Blood Glucose: Lab Results  Component Value Date   GLUCAP 144 (H) 03/02/2019     Pulmonary Assessment Scores:  Pulmonary Assessment Scores    Row Name 08/07/20 1257         ADL UCSD   ADL Phase Entry     SOB Score total 76        CAT Score   CAT Score 28       mMRC Score   mMRC Score 2           UCSD: Self-administered rating of dyspnea associated with activities of daily living (ADLs) 6-point scale (0 = "not at all" to 5 = "maximal or unable to do because of breathlessness")  Scoring Scores range from 0 to 120.  Minimally important difference is 5 units  CAT: CAT can identify the health impairment of COPD patients and is better correlated with disease progression.  CAT has a scoring range of zero to 40. The CAT score is classified into four groups of low (less than 10), medium (10 - 20), high (21-30) and very high (31-40) based on the impact level of disease on health status. A CAT score over 10 suggests significant symptoms.  A worsening CAT score could be explained by an exacerbation, poor medication adherence, poor inhaler technique, or progression of COPD or comorbid conditions.  CAT MCID is 2 points  mMRC: mMRC (Modified Medical Research Council) Dyspnea Scale is used to assess the degree of baseline functional disability in patients of respiratory disease due to dyspnea. No minimal important difference is established. A decrease in score of 1 point or greater is considered a positive change.   Pulmonary Function Assessment:  Pulmonary Function Assessment - 08/07/20 1246      Breath   Shortness of Breath No           Exercise Target Goals: Exercise Program Goal: Individual exercise prescription set using results from initial 6 min walk test and THRR while considering  patient's activity barriers and safety.   Exercise Prescription Goal: Initial exercise prescription builds to 30-45 minutes a day of aerobic activity, 2-3 days per week.  Home exercise guidelines will be given to patient during program as part of exercise prescription that the participant will acknowledge.  Activity Barriers & Risk Stratification:  Activity Barriers & Cardiac Risk Stratification - 08/07/20 1244      Activity  Barriers & Cardiac Risk Stratification   Activity Barriers Arthritis;Back Problems;Left Hip Replacement;Right Hip Replacement;Deconditioning    Cardiac Risk Stratification High           6 Minute Walk:  6 Minute Walk    Row Name 08/07/20 1546         6 Minute Walk   Phase Initial     Distance 900 feet     Walk Time 6 minutes     # of Rest Breaks 1     MPH 1.71     METS 2.67  RPE 10     Perceived Dyspnea  14     VO2 Peak 9.36     Symptoms Yes (comment)     Comments Stopped for ~30 sec due to desaturation to put oxygen on     Resting HR 104 bpm     Resting BP 112/70     Resting Oxygen Saturation  93 %     Exercise Oxygen Saturation  during 6 min walk 85 %     Max Ex. HR 141 bpm     Max Ex. BP 184/68     2 Minute Post BP 144/72       Interval HR   1 Minute HR 110     2 Minute HR 125     3 Minute HR 129     4 Minute HR 133     5 Minute HR 133     6 Minute HR 141     2 Minute Post HR 126     Interval Heart Rate? Yes       Interval Oxygen   Interval Oxygen? Yes     Baseline Oxygen Saturation % 93 %     1 Minute Oxygen Saturation % 85 %     1 Minute Liters of Oxygen 0 L     2 Minute Oxygen Saturation % 91 %     2 Minute Liters of Oxygen 2 L     3 Minute Oxygen Saturation % 90 %     3 Minute Liters of Oxygen 2 L     4 Minute Oxygen Saturation % 88 %     4 Minute Liters of Oxygen 2 L     5 Minute Oxygen Saturation % 88 %     5 Minute Liters of Oxygen 2 L     6 Minute Oxygen Saturation % 87 %     6 Minute Liters of Oxygen 2 L     2 Minute Post Oxygen Saturation % 97 %     2 Minute Post Liters of Oxygen 2 L            Oxygen Initial Assessment:  Oxygen Initial Assessment - 08/07/20 1545      Initial 6 min Walk   Oxygen Used Continuous    Liters per minute 2      Program Oxygen Prescription   Program Oxygen Prescription Continuous    Liters per minute 2      Intervention   Short Term Goals To learn and exhibit compliance with exercise, home and  travel O2 prescription;To learn and understand importance of monitoring SPO2 with pulse oximeter and demonstrate accurate use of the pulse oximeter.;To learn and understand importance of maintaining oxygen saturations>88%;To learn and demonstrate proper pursed lip breathing techniques or other breathing techniques.;To learn and demonstrate proper use of respiratory medications    Long  Term Goals Exhibits compliance with exercise, home and travel O2 prescription;Verbalizes importance of monitoring SPO2 with pulse oximeter and return demonstration;Maintenance of O2 saturations>88%;Exhibits proper breathing techniques, such as pursed lip breathing or other method taught during program session;Compliance with respiratory medication;Demonstrates proper use of MDI's           Oxygen Re-Evaluation:   Oxygen Discharge (Final Oxygen Re-Evaluation):   Initial Exercise Prescription:  Initial Exercise Prescription - 08/07/20 1500      Date of Initial Exercise RX and Referring Provider   Date 08/07/20    Referring Provider Dr. Vaughan Browner     Expected Discharge  Date 12/11/20      Oxygen   Oxygen Continuous    Liters 2      NuStep   Level 1    SPM 80    Minutes 17      Arm Ergometer   Level 1    RPM 60    Minutes 22      Prescription Details   Frequency (times per week) 2    Duration Progress to 30 minutes of continuous aerobic without signs/symptoms of physical distress      Intensity   THRR 40-80% of Max Heartrate 64-128    Ratings of Perceived Exertion 11-13    Perceived Dyspnea 0-4      Resistance Training   Training Prescription Yes    Weight 3 lbs    Reps 10-15           Perform Capillary Blood Glucose checks as needed.  Exercise Prescription Changes:   Exercise Comments:   Exercise Goals and Review:  Exercise Goals    Row Name 08/07/20 1550             Exercise Goals   Increase Physical Activity Yes       Intervention Provide advice, education, support  and counseling about physical activity/exercise needs.;Develop an individualized exercise prescription for aerobic and resistive training based on initial evaluation findings, risk stratification, comorbidities and participant's personal goals.       Expected Outcomes Short Term: Attend rehab on a regular basis to increase amount of physical activity.;Long Term: Add in home exercise to make exercise part of routine and to increase amount of physical activity.;Long Term: Exercising regularly at least 3-5 days a week.       Increase Strength and Stamina Yes       Intervention Provide advice, education, support and counseling about physical activity/exercise needs.;Develop an individualized exercise prescription for aerobic and resistive training based on initial evaluation findings, risk stratification, comorbidities and participant's personal goals.       Expected Outcomes Short Term: Increase workloads from initial exercise prescription for resistance, speed, and METs.;Short Term: Perform resistance training exercises routinely during rehab and add in resistance training at home;Long Term: Improve cardiorespiratory fitness, muscular endurance and strength as measured by increased METs and functional capacity (6MWT)       Able to understand and use rate of perceived exertion (RPE) scale Yes       Intervention Provide education and explanation on how to use RPE scale       Expected Outcomes Short Term: Able to use RPE daily in rehab to express subjective intensity level;Long Term:  Able to use RPE to guide intensity level when exercising independently       Able to understand and use Dyspnea scale Yes       Intervention Provide education and explanation on how to use Dyspnea scale       Expected Outcomes Short Term: Able to use Dyspnea scale daily in rehab to express subjective sense of shortness of breath during exertion;Long Term: Able to use Dyspnea scale to guide intensity level when exercising  independently       Knowledge and understanding of Target Heart Rate Range (THRR) Yes       Intervention Provide education and explanation of THRR including how the numbers were predicted and where they are located for reference       Expected Outcomes Short Term: Able to state/look up THRR;Long Term: Able to use THRR to govern intensity when exercising  independently;Short Term: Able to use daily as guideline for intensity in rehab       Understanding of Exercise Prescription Yes       Intervention Provide education, explanation, and written materials on patient's individual exercise prescription       Expected Outcomes Short Term: Able to explain program exercise prescription;Long Term: Able to explain home exercise prescription to exercise independently              Exercise Goals Re-Evaluation :   Discharge Exercise Prescription (Final Exercise Prescription Changes):   Nutrition:  Target Goals: Understanding of nutrition guidelines, daily intake of sodium 1500mg , cholesterol 200mg , calories 30% from fat and 7% or less from saturated fats, daily to have 5 or more servings of fruits and vegetables.  Biometrics:  Pre Biometrics - 08/07/20 1550      Pre Biometrics   Height 5\' 8"  (1.727 m)    Weight 296 lb 4.8 oz (134.4 kg)    Waist Circumference 53.5 inches    Hip Circumference 54 inches    Waist to Hip Ratio 0.99 %    BMI (Calculated) 45.06    Triceps Skinfold 14 mm    % Body Fat 40 %    Grip Strength 28.2 kg    Flexibility 0 in    Single Leg Stand 2 seconds            Nutrition Therapy Plan and Nutrition Goals:   Nutrition Assessments:  Nutrition Assessments - 08/07/20 1305      MEDFICTS Scores   Pre Score 15          MEDIFICTS Score Key:  ?70 Need to make dietary changes   40-70 Heart Healthy Diet  ? 40 Therapeutic Level Cholesterol Diet   Picture Your Plate Scores:  <16 Unhealthy dietary pattern with much room for improvement.  41-50 Dietary  pattern unlikely to meet recommendations for good health and room for improvement.  51-60 More healthful dietary pattern, with some room for improvement.   >60 Healthy dietary pattern, although there may be some specific behaviors that could be improved.    Nutrition Goals Re-Evaluation:   Nutrition Goals Discharge (Final Nutrition Goals Re-Evaluation):   Psychosocial: Target Goals: Acknowledge presence or absence of significant depression and/or stress, maximize coping skills, provide positive support system. Participant is able to verbalize types and ability to use techniques and skills needed for reducing stress and depression.  Initial Review & Psychosocial Screening:  Initial Psych Review & Screening - 08/07/20 1249      Initial Review   Current issues with None Identified      Family Dynamics   Good Support System? Yes    Comments His daughter is his support system. She lives nearby. He sees her frequently and they call each other frequently.      Barriers   Psychosocial barriers to participate in program There are no identifiable barriers or psychosocial needs.      Screening Interventions   Interventions Encouraged to exercise    Expected Outcomes Long Term Goal: Stressors or current issues are controlled or eliminated.;Short Term goal: Identification and review with participant of any Quality of Life or Depression concerns found by scoring the questionnaire.           Quality of Life Scores:  Quality of Life - 08/07/20 1544      Quality of Life   Select Quality of Life      Quality of Life Scores   Health/Function Pre 11.14 %  Socioeconomic Pre 13.5 %    Psych/Spiritual Pre 18.86 %    Family Pre 24 %    GLOBAL Pre 15.18 %          Scores of 19 and below usually indicate a poorer quality of life in these areas.  A difference of  2-3 points is a clinically meaningful difference.  A difference of 2-3 points in the total score of the Quality of Life Index  has been associated with significant improvement in overall quality of life, self-image, physical symptoms, and general health in studies assessing change in quality of life.   PHQ-9: Recent Review Flowsheet Data    Depression screen [facility] 2/9 08/07/2020   Decreased Interest 1   Down, Depressed, Hopeless 1   PHQ - 2 Score 2   Altered sleeping 0   Tired, decreased energy 3   Change in appetite 0   Feeling bad or failure about yourself  1   Trouble concentrating 0   Moving slowly or fidgety/restless 1   Suicidal thoughts 0   PHQ-9 Score 7   Difficult doing work/chores Not difficult at all     Interpretation of Total Score  Total Score Depression Severity:  1-4 = Minimal depression, 5-9 = Mild depression, 10-14 = Moderate depression, 15-19 = Moderately severe depression, 20-27 = Severe depression   Psychosocial Evaluation and Intervention:   Psychosocial Re-Evaluation:   Psychosocial Discharge (Final Psychosocial Re-Evaluation):    Education: Education Goals: Education classes will be provided on a weekly basis, covering required topics. Participant will state understanding/return demonstration of topics presented.  Learning Barriers/Preferences:  Learning Barriers/Preferences - 08/07/20 1246      Learning Barriers/Preferences   Learning Barriers None    Learning Preferences Skilled Demonstration;Individual Instruction           Education Topics: How Lungs Work and Diseases: - Discuss the anatomy of the lungs and diseases that can affect the lungs, such as COPD.   Exercise: -Discuss the importance of exercise, FITT principles of exercise, normal and abnormal responses to exercise, and how to exercise safely.   Environmental Irritants: -Discuss types of environmental irritants and how to limit exposure to environmental irritants.   Meds/Inhalers and oxygen: - Discuss respiratory medications, definition of an inhaler and oxygen, and the proper way to use an  inhaler and oxygen.   Energy Saving Techniques: - Discuss methods to conserve energy and decrease shortness of breath when performing activities of daily living.    Bronchial Hygiene / Breathing Techniques: - Discuss breathing mechanics, pursed-lip breathing technique,  proper posture, effective ways to clear airways, and other functional breathing techniques   Cleaning Equipment: - Provides group verbal and written instruction about the health risks of elevated stress, cause of high stress, and healthy ways to reduce stress.   Nutrition I: Fats: - Discuss the types of cholesterol, what cholesterol does to the body, and how cholesterol levels can be controlled.   Nutrition II: Labels: -Discuss the different components of food labels and how to read food labels.   Respiratory Infections: - Discuss the signs and symptoms of respiratory infections, ways to prevent respiratory infections, and the importance of seeking medical treatment when having a respiratory infection.   Stress I: Signs and Symptoms: - Discuss the causes of stress, how stress may lead to anxiety and depression, and ways to limit stress.   Stress II: Relaxation: -Discuss relaxation techniques to limit stress.   Oxygen for Home/Travel: - Discuss how to prepare for travel  when on oxygen and proper ways to transport and store oxygen to ensure safety.   Knowledge Questionnaire Score:  Knowledge Questionnaire Score - 08/07/20 1258      Knowledge Questionnaire Score   Pre Score 5/18           Core Components/Risk Factors/Patient Goals at Admission:  Personal Goals and Risk Factors at Admission - 08/07/20 1247      Core Components/Risk Factors/Patient Goals on Admission    Weight Management Yes;Obesity    Intervention Weight Management: Develop a combined nutrition and exercise program designed to reach desired caloric intake, while maintaining appropriate intake of nutrient and fiber, sodium and fats,  and appropriate energy expenditure required for the weight goal.;Weight Management: Provide education and appropriate resources to help participant work on and attain dietary goals.;Weight Management/Obesity: Establish reasonable short term and long term weight goals.;Obesity: Provide education and appropriate resources to help participant work on and attain dietary goals.    Admit Weight 294 lb (133.4 kg)    Goal Weight: Short Term 274 lb (124.3 kg)    Goal Weight: Long Term 250 lb (113.4 kg)    Expected Outcomes Short Term: Continue to assess and modify interventions until short term weight is achieved;Long Term: Adherence to nutrition and physical activity/exercise program aimed toward attainment of established weight goal;Weight Maintenance: Understanding of the daily nutrition guidelines, which includes 25-35% calories from fat, 7% or less cal from saturated fats, less than 200mg  cholesterol, less than 1.5gm of sodium, & 5 or more servings of fruits and vegetables daily;Weight Loss: Understanding of general recommendations for a balanced deficit meal plan, which promotes 1-2 lb weight loss per week and includes a negative energy balance of [phone] kcal/d;Understanding recommendations for meals to include 15-35% energy as protein, 25-35% energy from fat, 35-60% energy from carbohydrates, less than 200mg  of dietary cholesterol, 20-35 gm of total fiber daily;Understanding of distribution of calorie intake throughout the day with the consumption of 4-5 meals/snacks;Weight Gain: Understanding of general recommendations for a high calorie, high protein meal plan that promotes weight gain by distributing calorie intake throughout the day with the consumption for 4-5 meals, snacks, and/or supplements    Improve shortness of breath with ADL's Yes    Intervention Provide education, individualized exercise plan and daily activity instruction to help decrease symptoms of SOB with activities of daily living.     Expected Outcomes Short Term: Improve cardiorespiratory fitness to achieve a reduction of symptoms when performing ADLs;Long Term: Be able to perform more ADLs without symptoms or delay the onset of symptoms           Core Components/Risk Factors/Patient Goals Review:    Core Components/Risk Factors/Patient Goals at Discharge (Final Review):    ITP Comments:   Comments: Patient arrived for orientation at 1230. Patient was referred to PR by Dr. Vaughan Browner due to ILD. During orientation advised patient on arrival and appointment times what to wear, what to do before, during and after exercise. Reviewed attendance and class policy.  Pt is scheduled to return Pulmonary Rehab on 08/12/2020 at 1330. Pt was advised to come to class 15 minutes before class starts.  Discussed RPE/Dpysnea scales. Patient participated in warm up stretches. Patient was able to complete 6 minute walk test. Patient was measured for the equipment. Discussed equipment safety with patient. Took patient pre-anthropometric measurements. Patient finished visit at 1345.

## 2020-08-07 NOTE — Progress Notes (Signed)
Cardiac/Pulmonary Rehab Medication Review by a Pharmacist  Does the patient  feel that his/her medications are working for him/her?  Issues with inhalers- Only taking Proair right now- Symbicort high cost.  Has the patient been experiencing any side effects to the medications prescribed?  no  Does the patient measure his/her own blood pressure or blood glucose at home?  yes   Does the patient have any problems obtaining medications due to transportation or finances?   Symbicort high cost  Understanding of regimen: fair Understanding of indications: good Potential of compliance: good  Questions asked to Determine Patient Understanding of Medication Regimen:  1. What is the name of the medication?  2. What is the medication used for?  3. When should it be taken?  4. How much should be taken?  5. How will you take it?  6. What side effects should you report?  Understanding Defined as: Excellent: All questions above are correct Good: Questions 1-4 are correct Fair: Questions 1-2 are correct  Poor: 1 or none of the above questions are correct   Pharmacist comments: Patient has PCP appointment coming up and we discussed proper COPD inhaler regimen.  He states was discharged from hospital on Symbicort and worked great but cost $300.  Patient to ask PCP about starting on scheduled inhaler treatments for his COPD with one covered by insurance better.  Will need to follow-up med rec since patient was unsure of some doses and says MD switches between doses at times.    Mathew Lara 08/07/2020 1:36 PM

## 2020-08-12 ENCOUNTER — Other Ambulatory Visit: Payer: Self-pay

## 2020-08-12 ENCOUNTER — Encounter (HOSPITAL_COMMUNITY)
Admission: RE | Admit: 2020-08-12 | Discharge: 2020-08-12 | Disposition: A | Discharge status: Home / Self Care | Payer: 59 | Source: Ambulatory Visit | Attending: Pulmonary Disease | Admitting: Pulmonary Disease

## 2020-08-12 DIAGNOSIS — J849 Interstitial pulmonary disease, unspecified: Secondary | ICD-10-CM | POA: Diagnosis not present

## 2020-08-12 NOTE — Progress Notes (Signed)
Daily Session Note  Patient Details  Name: Mathew Lara MRN: [id number] Date of Birth: 1960-10-18 Referring Provider:     PULMONARY REHAB OTHER RESP ORIENTATION from 08/07/2020 in [place]  Referring Provider Dr. Vaughan Browner       Encounter Date: 08/12/2020  Check In:  Session Check In - 08/12/20 1330      Check-In   Supervising physician immediately available to respond to emergencies [facility] MD immediately available    Physician(s) Dr. Domenic Polite    Location [facility]    Staff Present Geanie Cooley, RN;Ajaya Crutchfield Audria Nine, MS, Exercise Physiologist;Dalton Kris Mouton, MS, ACSM-CEP, Exercise Physiologist    Virtual Visit No    Medication changes reported     No    Fall or balance concerns reported    No    Tobacco Cessation No Change    Warm-up and Cool-down Performed as group-led instruction    Resistance Training Performed Yes    VAD Patient? No    PAD/SET Patient? No      Pain Assessment   Currently in Pain? No/denies    Multiple Pain Sites No           Capillary Blood Glucose: No results found for this or any previous visit (from the past 24 hour(s)).    Social History   Tobacco Use  Smoking Status Former Smoker  . Packs/day: 1.00  . Years: 42.00  . Pack years: 42.00  . Types: Cigarettes  . Start date: 09/07/1971  . Quit date: 05/28/2014  . Years since quitting: 6.2  Smokeless Tobacco Never Used  Tobacco Comment   quit in November 2015 after smoking x 20 yrs.    Goals Met:  Independence with exercise equipment Exercise tolerated well No report of cardiac concerns or symptoms Strength training completed today  Goals Unmet:  Not Applicable  Comments: check out 1430    Dr. Kathie Dike is Medical Director for [facility].

## 2020-08-14 ENCOUNTER — Encounter (HOSPITAL_COMMUNITY)
Admission: RE | Admit: 2020-08-14 | Discharge: 2020-08-14 | Disposition: A | Discharge status: Home / Self Care | Payer: 59 | Source: Ambulatory Visit | Attending: Pulmonary Disease | Admitting: Pulmonary Disease

## 2020-08-14 ENCOUNTER — Other Ambulatory Visit: Payer: Self-pay

## 2020-08-14 DIAGNOSIS — J849 Interstitial pulmonary disease, unspecified: Secondary | ICD-10-CM

## 2020-08-14 NOTE — Progress Notes (Signed)
Daily Session Note  Patient Details  Name: Mathew Lara MRN: [id number] Date of Birth: 1961/07/21 Referring Provider:   Flowsheet Row PULMONARY REHAB OTHER RESP ORIENTATION from 08/07/2020 in [place]  Referring Provider Dr. Vaughan Browner       Encounter Date: 08/14/2020  Check In:  Session Check In - 08/14/20 1330      Check-In   Supervising physician immediately available to respond to emergencies [facility] MD immediately available    Physician(s) Dr. Harl Bowie    Location [facility]    Staff Present Cathren Harsh, MS, Exercise Physiologist;Phyllis Billingsley, RN    Virtual Visit No    Medication changes reported     No    Fall or balance concerns reported    No    Tobacco Cessation No Change    Warm-up and Cool-down Performed as group-led instruction    Resistance Training Performed Yes    VAD Patient? No    PAD/SET Patient? No      Pain Assessment   Currently in Pain? No/denies    Multiple Pain Sites No           Capillary Blood Glucose: No results found for this or any previous visit (from the past 24 hour(s)).    Social History   Tobacco Use  Smoking Status Former Smoker  . Packs/day: 1.00  . Years: 42.00  . Pack years: 42.00  . Types: Cigarettes  . Start date: 09/07/1971  . Quit date: 05/28/2014  . Years since quitting: 6.2  Smokeless Tobacco Never Used  Tobacco Comment   quit in November 2015 after smoking x 20 yrs.    Goals Met:  Independence with exercise equipment Exercise tolerated well No report of cardiac concerns or symptoms Strength training completed today  Goals Unmet:  Not Applicable  Comments: Check out 1430   Dr. Kathie Dike is Medical Director for [facility] Pulmonary Rehab.

## 2020-08-19 ENCOUNTER — Encounter (HOSPITAL_COMMUNITY)
Admission: RE | Admit: 2020-08-19 | Discharge: 2020-08-19 | Disposition: A | Discharge status: Home / Self Care | Payer: 59 | Source: Ambulatory Visit | Attending: Pulmonary Disease | Admitting: Pulmonary Disease

## 2020-08-19 ENCOUNTER — Other Ambulatory Visit: Payer: Self-pay

## 2020-08-19 VITALS — Wt 294.1 lb

## 2020-08-19 DIAGNOSIS — J849 Interstitial pulmonary disease, unspecified: Secondary | ICD-10-CM

## 2020-08-19 NOTE — Progress Notes (Signed)
Daily Session Note  Patient Details  Name: BASHEER MOLCHAN MRN: [id number] Date of Birth: 1961-08-31 Referring Provider:   Flowsheet Row PULMONARY REHAB OTHER RESP ORIENTATION from 08/07/2020 in [place]  Referring Provider Dr. Vaughan Browner       Encounter Date: 08/19/2020  Check In:  Session Check In - 08/19/20 1330      Check-In   Supervising physician immediately available to respond to emergencies [facility] MD immediately available    Physician(s) Dr. Harl Bowie    Location [facility]    Staff Present Geanie Cooley, RN;Thornton Dohrmann Audria Nine, MS, Exercise Physiologist    Virtual Visit No    Medication changes reported     No    Fall or balance concerns reported    No    Tobacco Cessation No Change    Warm-up and Cool-down Performed as group-led instruction    Resistance Training Performed Yes    VAD Patient? No    PAD/SET Patient? No      Pain Assessment   Currently in Pain? Yes    Pain Score 6     Pain Location Back    Pain Orientation Lower    Pain Descriptors / Indicators Constant;Aching    Multiple Pain Sites No           Capillary Blood Glucose: No results found for this or any previous visit (from the past 24 hour(s)).    Social History   Tobacco Use  Smoking Status Former Smoker  . Packs/day: 1.00  . Years: 42.00  . Pack years: 42.00  . Types: Cigarettes  . Start date: 09/07/1971  . Quit date: 05/28/2014  . Years since quitting: 6.2  Smokeless Tobacco Never Used  Tobacco Comment   quit in November 2015 after smoking x 20 yrs.    Goals Met:  Independence with exercise equipment Exercise tolerated well No report of cardiac concerns or symptoms Strength training completed today  Goals Unmet:  Not Applicable  Comments: check out 1430   Dr. Kathie Dike is Medical Director for [facility].

## 2020-08-21 ENCOUNTER — Other Ambulatory Visit: Payer: Self-pay

## 2020-08-21 ENCOUNTER — Encounter (HOSPITAL_COMMUNITY)
Admission: RE | Admit: 2020-08-21 | Discharge: 2020-08-21 | Disposition: A | Discharge status: Home / Self Care | Payer: 59 | Source: Ambulatory Visit | Attending: Pulmonary Disease | Admitting: Pulmonary Disease

## 2020-08-21 DIAGNOSIS — J849 Interstitial pulmonary disease, unspecified: Secondary | ICD-10-CM

## 2020-08-21 NOTE — Progress Notes (Signed)
Pulmonary Individual Treatment Plan  Patient Details  Name: Mathew Lara MRN: [id number] Date of Birth: February 08, 1961 Referring Provider:   [place] from 08/07/2020 in [place]  Referring Provider Dr. Vaughan Browner       Initial Encounter Date:  Flowsheet Row PULMONARY REHAB OTHER RESP ORIENTATION from 08/07/2020 in [place]  Date 08/07/20      Visit Diagnosis: Interstitial lung disease ([place])  Patient's Home Medications on Admission:   Current Outpatient Medications:  .  acetaminophen (TYLENOL) 325 MG tablet, Take 2 tablets (650 mg total) by mouth every 6 (six) hours as needed for mild pain or headache (fever >/= 101)., Disp: 12 tablet, Rfl: 0 .  amLODipine (NORVASC) 10 MG tablet, Take 10 mg by mouth daily., Disp: , Rfl:  .  ascorbic acid (VITAMIN C) 500 MG tablet, Take 1 tablet (500 mg total) by mouth daily. (Patient not taking: Reported on 08/07/2020), Disp: 30 tablet, Rfl: 1 .  aspirin EC 81 MG tablet, Take 81 mg by mouth daily., Disp: , Rfl:  .  budesonide-formoterol (SYMBICORT) 80-4.5 MCG/ACT inhaler, Inhale 2 puffs into the lungs in the morning and at bedtime. (Patient not taking: Reported on 08/07/2020), Disp: 10.2 g, Rfl: 12 .  DULoxetine (CYMBALTA) 30 MG capsule, Take 60 mg by mouth daily. (Patient not taking: Reported on 08/07/2020), Disp: , Rfl:  .  HYDROcodone-acetaminophen (NORCO/VICODIN) 5-325 MG tablet, Take 1 tablet by mouth every 6 (six) hours as needed for moderate pain. (Patient not taking: Reported on 08/07/2020), Disp: , Rfl:  .  hydrOXYzine (ATARAX/VISTARIL) 25 MG tablet, Take 25 mg by mouth 3 (three) times daily as needed for anxiety., Disp: , Rfl:  .  Ipratropium-Albuterol (COMBIVENT) 20-100 MCG/ACT AERS respimat, Inhale 1 puff into the lungs every 6 (six) hours. (Patient not taking: Reported on 08/07/2020), Disp: 4 g, Rfl: 1 .  loratadine (CLARITIN) 10 MG tablet, Take 1 tablet (10  mg total) by mouth daily., Disp: 30 tablet, Rfl: 1 .  losartan (COZAAR) 25 MG tablet, Take 25 mg by mouth daily. , Disp: , Rfl:  .  oxyCODONE (OXY IR/ROXICODONE) 5 MG immediate release tablet, Take 5 mg by mouth every 8 (eight) hours as needed., Disp: , Rfl:  .  PROAIR HFA 108 (90 Base) MCG/ACT inhaler, Inhale 2 puffs into the lungs every 4 (four) hours as needed for wheezing or shortness of breath., Disp: 18 g, Rfl: 0 .  sodium bicarbonate 650 MG tablet, Take 650 mg by mouth 3 (three) times daily., Disp: , Rfl:  .  zinc sulfate 220 (50 Zn) MG capsule, Take 1 capsule (220 mg total) by mouth daily. (Patient not taking: Reported on 08/07/2020), Disp: 30 capsule, Rfl: 1  Past Medical History: Past Medical History:  Diagnosis Date  . Alcohol abuse   . Anxiety   . Bronchitis   . Chronic hip pain   . CKD (chronic kidney disease) stage 3, GFR 30-59 ml/min (HCC)   . COPD (chronic obstructive pulmonary disease) ([place])   . Essential hypertension   . Hepatitis C   . Mixed hyperlipidemia   . Sleep apnea     Tobacco Use: Social History   Tobacco Use  Smoking Status Former Smoker  . Packs/day: 1.00  . Years: 42.00  . Pack years: 42.00  . Types: Cigarettes  . Start date: 09/07/1971  . Quit date: 05/28/2014  . Years since quitting: 6.2  Smokeless Tobacco Never Used  Tobacco Comment  quit in November 2015 after smoking x 20 yrs.    Labs: Recent Review Flowsheet Data    Labs for ITP Cardiac and Pulmonary Rehab Latest Ref Rng & Units 08/29/2019 09/28/2019   Trlycerides <150 mg/dL 199(H) -   PHART 7.350 - 7.450 7.393 7.343(L)    PCO2ART 32.0 - 48.0 mmHg 40.5 45.5    HCO3 20.0 - 28.0 mmol/L 24.2 24.1    ACIDBASEDEF 0.0 - 2.0 mmol/L 0.1 1.3    O2SAT % 90.7 94.1       Capillary Blood Glucose: Lab Results  Component Value Date   GLUCAP 144 (H) 03/02/2019     Pulmonary Assessment Scores:  Pulmonary Assessment Scores    Row Name 08/07/20 1257         ADL UCSD   ADL Phase Entry      SOB Score total 76           CAT Score   CAT Score 28           mMRC Score   mMRC Score 2           UCSD: Self-administered rating of dyspnea associated with activities of daily living (ADLs) 6-point scale (0 = "not at all" to 5 = "maximal or unable to do because of breathlessness")  Scoring Scores range from 0 to 120.  Minimally important difference is 5 units  CAT: CAT can identify the health impairment of COPD patients and is better correlated with disease progression.  CAT has a scoring range of zero to 40. The CAT score is classified into four groups of low (less than 10), medium (10 - 20), high (21-30) and very high (31-40) based on the impact level of disease on health status. A CAT score over 10 suggests significant symptoms.  A worsening CAT score could be explained by an exacerbation, poor medication adherence, poor inhaler technique, or progression of COPD or comorbid conditions.  CAT MCID is 2 points  mMRC: mMRC (Modified Medical Research Council) Dyspnea Scale is used to assess the degree of baseline functional disability in patients of respiratory disease due to dyspnea. No minimal important difference is established. A decrease in score of 1 point or greater is considered a positive change.   Pulmonary Function Assessment:  Pulmonary Function Assessment - 08/07/20 1246      Breath   Shortness of Breath No           Exercise Target Goals: Exercise Program Goal: Individual exercise prescription set using results from initial 6 min walk test and THRR while considering  patient's activity barriers and safety.   Exercise Prescription Goal: Initial exercise prescription builds to 30-45 minutes a day of aerobic activity, 2-3 days per week.  Home exercise guidelines will be given to patient during program as part of exercise prescription that the participant will acknowledge.  Activity Barriers & Risk Stratification:  Activity Barriers & Cardiac Risk Stratification  - 08/07/20 1244      Activity Barriers & Cardiac Risk Stratification   Activity Barriers Arthritis;Back Problems;Left Hip Replacement;Right Hip Replacement;Deconditioning    Cardiac Risk Stratification High           6 Minute Walk:  6 Minute Walk    Row Name 08/07/20 1546         6 Minute Walk   Phase Initial     Distance 900 feet     Walk Time 6 minutes     # of Rest Breaks 1  MPH 1.71     METS 2.67     RPE 10     Perceived Dyspnea  14     VO2 Peak 9.36     Symptoms Yes (comment)     Comments Stopped for ~30 sec due to desaturation to put oxygen on     Resting HR 104 bpm     Resting BP 112/70     Resting Oxygen Saturation  93 %     Exercise Oxygen Saturation  during 6 min walk 85 %     Max Ex. HR 141 bpm     Max Ex. BP 184/68     2 Minute Post BP 144/72           Interval HR   1 Minute HR 110     2 Minute HR 125     3 Minute HR 129     4 Minute HR 133     5 Minute HR 133     6 Minute HR 141     2 Minute Post HR 126     Interval Heart Rate? Yes           Interval Oxygen   Interval Oxygen? Yes     Baseline Oxygen Saturation % 93 %     1 Minute Oxygen Saturation % 85 %     1 Minute Liters of Oxygen 0 L     2 Minute Oxygen Saturation % 91 %     2 Minute Liters of Oxygen 2 L     3 Minute Oxygen Saturation % 90 %     3 Minute Liters of Oxygen 2 L     4 Minute Oxygen Saturation % 88 %     4 Minute Liters of Oxygen 2 L     5 Minute Oxygen Saturation % 88 %     5 Minute Liters of Oxygen 2 L     6 Minute Oxygen Saturation % 87 %     6 Minute Liters of Oxygen 2 L     2 Minute Post Oxygen Saturation % 97 %     2 Minute Post Liters of Oxygen 2 L            Oxygen Initial Assessment:  Oxygen Initial Assessment - 08/07/20 1545      Initial 6 min Walk   Oxygen Used Continuous    Liters per minute 2      Program Oxygen Prescription   Program Oxygen Prescription Continuous    Liters per minute 2      Intervention   Short Term Goals To learn and  exhibit compliance with exercise, home and travel O2 prescription;To learn and understand importance of monitoring SPO2 with pulse oximeter and demonstrate accurate use of the pulse oximeter.;To learn and understand importance of maintaining oxygen saturations>88%;To learn and demonstrate proper pursed lip breathing techniques or other breathing techniques.;To learn and demonstrate proper use of respiratory medications    Long  Term Goals Exhibits compliance with exercise, home and travel O2 prescription;Verbalizes importance of monitoring SPO2 with pulse oximeter and return demonstration;Maintenance of O2 saturations>88%;Exhibits proper breathing techniques, such as pursed lip breathing or other method taught during program session;Compliance with respiratory medication;Demonstrates proper use of MDI's           Oxygen Re-Evaluation:  Oxygen Re-Evaluation    Row Name 08/19/20 1451             Program Oxygen Prescription   Program Oxygen  Prescription Continuous       Liters per minute 2               Home Oxygen   Home Oxygen Device E-Tanks;Home Concentrator       Sleep Oxygen Prescription CPAP       Home Exercise Oxygen Prescription Continuous       Liters per minute 2       Home Resting Oxygen Prescription None       Compliance with Home Oxygen Use Yes               Goals/Expected Outcomes   Short Term Goals To learn and exhibit compliance with exercise, home and travel O2 prescription;To learn and understand importance of monitoring SPO2 with pulse oximeter and demonstrate accurate use of the pulse oximeter.;To learn and understand importance of maintaining oxygen saturations>88%;To learn and demonstrate proper pursed lip breathing techniques or other breathing techniques.;To learn and demonstrate proper use of respiratory medications       Long  Term Goals Exhibits compliance with exercise, home and travel O2 prescription;Verbalizes importance of monitoring SPO2 with pulse  oximeter and return demonstration;Maintenance of O2 saturations>88%;Exhibits proper breathing techniques, such as pursed lip breathing or other method taught during program session;Compliance with respiratory medication;Demonstrates proper use of MDI's       Goals/Expected Outcomes compliance              Oxygen Discharge (Final Oxygen Re-Evaluation):  Oxygen Re-Evaluation - 08/19/20 1451      Program Oxygen Prescription   Program Oxygen Prescription Continuous    Liters per minute 2      Home Oxygen   Home Oxygen Device E-Tanks;Home Concentrator    Sleep Oxygen Prescription CPAP    Home Exercise Oxygen Prescription Continuous    Liters per minute 2    Home Resting Oxygen Prescription None    Compliance with Home Oxygen Use Yes      Goals/Expected Outcomes   Short Term Goals To learn and exhibit compliance with exercise, home and travel O2 prescription;To learn and understand importance of monitoring SPO2 with pulse oximeter and demonstrate accurate use of the pulse oximeter.;To learn and understand importance of maintaining oxygen saturations>88%;To learn and demonstrate proper pursed lip breathing techniques or other breathing techniques.;To learn and demonstrate proper use of respiratory medications    Long  Term Goals Exhibits compliance with exercise, home and travel O2 prescription;Verbalizes importance of monitoring SPO2 with pulse oximeter and return demonstration;Maintenance of O2 saturations>88%;Exhibits proper breathing techniques, such as pursed lip breathing or other method taught during program session;Compliance with respiratory medication;Demonstrates proper use of MDI's    Goals/Expected Outcomes compliance           Initial Exercise Prescription:  Initial Exercise Prescription - 08/07/20 1500      Date of Initial Exercise RX and Referring Provider   Date 08/07/20    Referring Provider Dr. Vaughan Browner     Expected Discharge Date 12/11/20      Oxygen   Oxygen  Continuous    Liters 2      NuStep   Level 1    SPM 80    Minutes 17      Arm Ergometer   Level 1    RPM 60    Minutes 22      Prescription Details   Frequency (times per week) 2    Duration Progress to 30 minutes of continuous aerobic without signs/symptoms of physical distress  Intensity   THRR 40-80% of Max Heartrate 64-128    Ratings of Perceived Exertion 11-13    Perceived Dyspnea 0-4      Resistance Training   Training Prescription Yes    Weight 3 lbs    Reps 10-15           Perform Capillary Blood Glucose checks as needed.  Exercise Prescription Changes:  Exercise Prescription Changes    Row Name 08/19/20 1400             Response to Exercise   Blood Pressure (Admit) 118/80       Blood Pressure (Exercise) 152/70       Blood Pressure (Exit) 118/70       Heart Rate (Admit) 98 bpm       Heart Rate (Exercise) 115 bpm       Heart Rate (Exit) 105 bpm       Oxygen Saturation (Admit) 93 %       Oxygen Saturation (Exercise) 96 %       Oxygen Saturation (Exit) 97 %       Rating of Perceived Exertion (Exercise) 12       Perceived Dyspnea (Exercise) 6       Duration Continue with 30 min of aerobic exercise without signs/symptoms of physical distress.       Intensity THRR unchanged               Progression   Progression Continue to progress workloads to maintain intensity without signs/symptoms of physical distress.               Resistance Training   Training Prescription Yes       Weight 3 lbs       Reps 10-15               Oxygen   Oxygen Continuous       Liters 2               NuStep   Level 1       SPM 86       Minutes 22       METs 1.9               Arm Ergometer   Level 1       RPM 46       Minutes 17       METs 1.4              Exercise Comments:   Exercise Goals and Review:  Exercise Goals    Row Name 08/07/20 1550 08/19/20 1454           Exercise Goals   Increase Physical Activity Yes Yes      Intervention  Provide advice, education, support and counseling about physical activity/exercise needs.;Develop an individualized exercise prescription for aerobic and resistive training based on initial evaluation findings, risk stratification, comorbidities and participant's personal goals. Provide advice, education, support and counseling about physical activity/exercise needs.;Develop an individualized exercise prescription for aerobic and resistive training based on initial evaluation findings, risk stratification, comorbidities and participant's personal goals.      Expected Outcomes Short Term: Attend rehab on a regular basis to increase amount of physical activity.;Long Term: Add in home exercise to make exercise part of routine and to increase amount of physical activity.;Long Term: Exercising regularly at least 3-5 days a week. Short Term: Attend rehab on a regular basis to increase amount of physical activity.;Long  Term: Add in home exercise to make exercise part of routine and to increase amount of physical activity.;Long Term: Exercising regularly at least 3-5 days a week.      Increase Strength and Stamina Yes Yes      Intervention Provide advice, education, support and counseling about physical activity/exercise needs.;Develop an individualized exercise prescription for aerobic and resistive training based on initial evaluation findings, risk stratification, comorbidities and participant's personal goals. Provide advice, education, support and counseling about physical activity/exercise needs.;Develop an individualized exercise prescription for aerobic and resistive training based on initial evaluation findings, risk stratification, comorbidities and participant's personal goals.      Expected Outcomes Short Term: Increase workloads from initial exercise prescription for resistance, speed, and METs.;Short Term: Perform resistance training exercises routinely during rehab and add in resistance training at  home;Long Term: Improve cardiorespiratory fitness, muscular endurance and strength as measured by increased METs and functional capacity (6MWT) Short Term: Increase workloads from initial exercise prescription for resistance, speed, and METs.;Short Term: Perform resistance training exercises routinely during rehab and add in resistance training at home;Long Term: Improve cardiorespiratory fitness, muscular endurance and strength as measured by increased METs and functional capacity (6MWT)      Able to understand and use rate of perceived exertion (RPE) scale Yes Yes      Intervention Provide education and explanation on how to use RPE scale Provide education and explanation on how to use RPE scale      Expected Outcomes Short Term: Able to use RPE daily in rehab to express subjective intensity level;Long Term:  Able to use RPE to guide intensity level when exercising independently Short Term: Able to use RPE daily in rehab to express subjective intensity level;Long Term:  Able to use RPE to guide intensity level when exercising independently      Able to understand and use Dyspnea scale Yes Yes      Intervention Provide education and explanation on how to use Dyspnea scale Provide education and explanation on how to use Dyspnea scale      Expected Outcomes Short Term: Able to use Dyspnea scale daily in rehab to express subjective sense of shortness of breath during exertion;Long Term: Able to use Dyspnea scale to guide intensity level when exercising independently Short Term: Able to use Dyspnea scale daily in rehab to express subjective sense of shortness of breath during exertion;Long Term: Able to use Dyspnea scale to guide intensity level when exercising independently      Knowledge and understanding of Target Heart Rate Range (THRR) Yes Yes      Intervention Provide education and explanation of THRR including how the numbers were predicted and where they are located for reference Provide education and  explanation of THRR including how the numbers were predicted and where they are located for reference      Expected Outcomes Short Term: Able to state/look up THRR;Long Term: Able to use THRR to govern intensity when exercising independently;Short Term: Able to use daily as guideline for intensity in rehab Short Term: Able to state/look up THRR;Long Term: Able to use THRR to govern intensity when exercising independently;Short Term: Able to use daily as guideline for intensity in rehab      Understanding of Exercise Prescription Yes Yes      Intervention Provide education, explanation, and written materials on patient's individual exercise prescription Provide education, explanation, and written materials on patient's individual exercise prescription      Expected Outcomes Short Term: Able to explain program  exercise prescription;Long Term: Able to explain home exercise prescription to exercise independently Short Term: Able to explain program exercise prescription;Long Term: Able to explain home exercise prescription to exercise independently             Exercise Goals Re-Evaluation :  Exercise Goals Re-Evaluation    [place] Name 08/19/20 1455             Exercise Goal Re-Evaluation   Exercise Goals Review Increase Physical Activity;Increase Strength and Stamina;Able to understand and use rate of perceived exertion (RPE) scale;Able to understand and use Dyspnea scale;Knowledge and understanding of Target Heart Rate Range (THRR);Understanding of Exercise Prescription       Comments Pt has attended 3 exercise sessions. He maintains a positive attitude and gives good effort. He is currently exercising at 1.9 METs on the stepper. Will continue to monitor and progress as able.       Expected Outcomes Through exercise at rehab and by engaging in a home exercise program, the patient will meet their stated goals.              Discharge Exercise Prescription (Final Exercise Prescription Changes):   Exercise Prescription Changes - 08/19/20 1400      Response to Exercise   Blood Pressure (Admit) 118/80    Blood Pressure (Exercise) 152/70    Blood Pressure (Exit) 118/70    Heart Rate (Admit) 98 bpm    Heart Rate (Exercise) 115 bpm    Heart Rate (Exit) 105 bpm    Oxygen Saturation (Admit) 93 %    Oxygen Saturation (Exercise) 96 %    Oxygen Saturation (Exit) 97 %    Rating of Perceived Exertion (Exercise) 12    Perceived Dyspnea (Exercise) 6    Duration Continue with 30 min of aerobic exercise without signs/symptoms of physical distress.    Intensity THRR unchanged      Progression   Progression Continue to progress workloads to maintain intensity without signs/symptoms of physical distress.      Resistance Training   Training Prescription Yes    Weight 3 lbs    Reps 10-15      Oxygen   Oxygen Continuous    Liters 2      NuStep   Level 1    SPM 86    Minutes 22    METs 1.9      Arm Ergometer   Level 1    RPM 46    Minutes 17    METs 1.4           Nutrition:  Target Goals: Understanding of nutrition guidelines, daily intake of sodium 1500mg , cholesterol 200mg , calories 30% from fat and 7% or less from saturated fats, daily to have 5 or more servings of fruits and vegetables.  Biometrics:  Pre Biometrics - 08/07/20 1550      Pre Biometrics   Height 5\' 8"  (1.727 m)    Weight 134.4 kg    Waist Circumference 53.5 inches    Hip Circumference 54 inches    Waist to Hip Ratio 0.99 %    BMI (Calculated) 45.06    Triceps Skinfold 14 mm    % Body Fat 40 %    Grip Strength 28.2 kg    Flexibility 0 in    Single Leg Stand 2 seconds            Nutrition Therapy Plan and Nutrition Goals:  Nutrition Therapy & Goals - 08/18/20 0912      Personal  Nutrition Goals   Comments Patient's diet medficts score was 15. We will continue to provide nutritional education through hand-outs.      Intervention Plan   Intervention Nutrition handout(s) given to patient.            Nutrition Assessments:  Nutrition Assessments - 08/07/20 1305      MEDFICTS Scores   Pre Score 15          MEDIFICTS Score Key:  ?70 Need to make dietary changes   40-70 Heart Healthy Diet  ? 40 Therapeutic Level Cholesterol Diet   Picture Your Plate Scores:  <67 Unhealthy dietary pattern with much room for improvement.  41-50 Dietary pattern unlikely to meet recommendations for good health and room for improvement.  51-60 More healthful dietary pattern, with some room for improvement.   >60 Healthy dietary pattern, although there may be some specific behaviors that could be improved.    Nutrition Goals Re-Evaluation:   Nutrition Goals Discharge (Final Nutrition Goals Re-Evaluation):   Psychosocial: Target Goals: Acknowledge presence or absence of significant depression and/or stress, maximize coping skills, provide positive support system. Participant is able to verbalize types and ability to use techniques and skills needed for reducing stress and depression.  Initial Review & Psychosocial Screening:  Initial Psych Review & Screening - 08/07/20 1249      Initial Review   Current issues with None Identified      Family Dynamics   Good Support System? Yes    Comments His daughter is his support system. She lives nearby. He sees her frequently and they call each other frequently.      Barriers   Psychosocial barriers to participate in program There are no identifiable barriers or psychosocial needs.      Screening Interventions   Interventions Encouraged to exercise    Expected Outcomes Long Term Goal: Stressors or current issues are controlled or eliminated.;Short Term goal: Identification and review with participant of any Quality of Life or Depression concerns found by scoring the questionnaire.           Quality of Life Scores:  Quality of Life - 08/07/20 1544      Quality of Life   Select Quality of Life      Quality of Life Scores    Health/Function Pre 11.14 %    Socioeconomic Pre 13.5 %    Psych/Spiritual Pre 18.86 %    Family Pre 24 %    GLOBAL Pre 15.18 %          Scores of 19 and below usually indicate a poorer quality of life in these areas.  A difference of  2-3 points is a clinically meaningful difference.  A difference of 2-3 points in the total score of the Quality of Life Index has been associated with significant improvement in overall quality of life, self-image, physical symptoms, and general health in studies assessing change in quality of life.   PHQ-9: Recent Review Flowsheet Data    Depression screen [employer] 2/9 08/07/2020   Decreased Interest 1   Down, Depressed, Hopeless 1   PHQ - 2 Score 2   Altered sleeping 0   Tired, decreased energy 3   Change in appetite 0   Feeling bad or failure about yourself  1   Trouble concentrating 0   Moving slowly or fidgety/restless 1   Suicidal thoughts 0   PHQ-9 Score 7   Difficult doing work/chores Not difficult at all     Interpretation  of Total Score  Total Score Depression Severity:  1-4 = Minimal depression, 5-9 = Mild depression, 10-14 = Moderate depression, 15-19 = Moderately severe depression, 20-27 = Severe depression   Psychosocial Evaluation and Intervention:   Psychosocial Re-Evaluation:  Psychosocial Re-Evaluation    [place] Name 08/18/20 0920             Psychosocial Re-Evaluation   Current issues with None Identified       Comments Patient's initial QOL score was 15.18% overall and his PHQ-9 score was 7. He has been on cymbalta 30 mg BID but reproted that he was not taking during pharmacy review at his orientation visit. He is new to the program completing 2 sessions. Will continue to moniotr.       Expected Outcomes Patient will have no psychosocial issues identified at discharge.       Interventions Encouraged to attend Pulmonary Rehabilitation for the exercise;Relaxation education;Stress management education       Continue Psychosocial  Services  No Follow up required              Psychosocial Discharge (Final Psychosocial Re-Evaluation):  Psychosocial Re-Evaluation - 08/18/20 0920      Psychosocial Re-Evaluation   Current issues with None Identified    Comments Patient's initial QOL score was 15.18% overall and his PHQ-9 score was 7. He has been on cymbalta 30 mg BID but reproted that he was not taking during pharmacy review at his orientation visit. He is new to the program completing 2 sessions. Will continue to moniotr.    Expected Outcomes Patient will have no psychosocial issues identified at discharge.    Interventions Encouraged to attend Pulmonary Rehabilitation for the exercise;Relaxation education;Stress management education    Continue Psychosocial Services  No Follow up required            Education: Education Goals: Education classes will be provided on a weekly basis, covering required topics. Participant will state understanding/return demonstration of topics presented.  Learning Barriers/Preferences:  Learning Barriers/Preferences - 08/07/20 1246      Learning Barriers/Preferences   Learning Barriers None    Learning Preferences Skilled Demonstration;Individual Instruction           Education Topics: How Lungs Work and Diseases: - Discuss the anatomy of the lungs and diseases that can affect the lungs, such as COPD.   Exercise: -Discuss the importance of exercise, FITT principles of exercise, normal and abnormal responses to exercise, and how to exercise safely.   Environmental Irritants: -Discuss types of environmental irritants and how to limit exposure to environmental irritants.   Meds/Inhalers and oxygen: - Discuss respiratory medications, definition of an inhaler and oxygen, and the proper way to use an inhaler and oxygen.   Energy Saving Techniques: - Discuss methods to conserve energy and decrease shortness of breath when performing activities of daily living.     Bronchial Hygiene / Breathing Techniques: - Discuss breathing mechanics, pursed-lip breathing technique,  proper posture, effective ways to clear airways, and other functional breathing techniques   Cleaning Equipment: - Provides group verbal and written instruction about the health risks of elevated stress, cause of high stress, and healthy ways to reduce stress.   Nutrition I: Fats: - Discuss the types of cholesterol, what cholesterol does to the body, and how cholesterol levels can be controlled.   Nutrition II: Labels: -Discuss the different components of food labels and how to read food labels.   Respiratory Infections: - Discuss the signs and symptoms of  respiratory infections, ways to prevent respiratory infections, and the importance of seeking medical treatment when having a respiratory infection.   Stress I: Signs and Symptoms: - Discuss the causes of stress, how stress may lead to anxiety and depression, and ways to limit stress. Flowsheet Row PULMONARY REHAB OTHER RESPIRATORY from 08/14/2020 in [place]  Date 08/14/20  Educator [facility]  Instruction Review Code 1- Verbalizes Understanding      Stress II: Relaxation: -Discuss relaxation techniques to limit stress.   Oxygen for Home/Travel: - Discuss how to prepare for travel when on oxygen and proper ways to transport and store oxygen to ensure safety.   Knowledge Questionnaire Score:  Knowledge Questionnaire Score - 08/07/20 1258      Knowledge Questionnaire Score   Pre Score 5/18           Core Components/Risk Factors/Patient Goals at Admission:  Personal Goals and Risk Factors at Admission - 08/07/20 1247      Core Components/Risk Factors/Patient Goals on Admission    Weight Management Yes;Obesity    Intervention Weight Management: Develop a combined nutrition and exercise program designed to reach desired caloric intake, while maintaining appropriate intake of nutrient and  fiber, sodium and fats, and appropriate energy expenditure required for the weight goal.;Weight Management: Provide education and appropriate resources to help participant work on and attain dietary goals.;Weight Management/Obesity: Establish reasonable short term and long term weight goals.;Obesity: Provide education and appropriate resources to help participant work on and attain dietary goals.    Admit Weight 294 lb (133.4 kg)    Goal Weight: Short Term 274 lb (124.3 kg)    Goal Weight: Long Term 250 lb (113.4 kg)    Expected Outcomes Short Term: Continue to assess and modify interventions until short term weight is achieved;Long Term: Adherence to nutrition and physical activity/exercise program aimed toward attainment of established weight goal;Weight Maintenance: Understanding of the daily nutrition guidelines, which includes 25-35% calories from fat, 7% or less cal from saturated fats, less than 200mg  cholesterol, less than 1.5gm of sodium, & 5 or more servings of fruits and vegetables daily;Weight Loss: Understanding of general recommendations for a balanced deficit meal plan, which promotes 1-2 lb weight loss per week and includes a negative energy balance of [phone] kcal/d;Understanding recommendations for meals to include 15-35% energy as protein, 25-35% energy from fat, 35-60% energy from carbohydrates, less than 200mg  of dietary cholesterol, 20-35 gm of total fiber daily;Understanding of distribution of calorie intake throughout the day with the consumption of 4-5 meals/snacks;Weight Gain: Understanding of general recommendations for a high calorie, high protein meal plan that promotes weight gain by distributing calorie intake throughout the day with the consumption for 4-5 meals, snacks, and/or supplements    Improve shortness of breath with ADL's Yes    Intervention Provide education, individualized exercise plan and daily activity instruction to help decrease symptoms of SOB with activities  of daily living.    Expected Outcomes Short Term: Improve cardiorespiratory fitness to achieve a reduction of symptoms when performing ADLs;Long Term: Be able to perform more ADLs without symptoms or delay the onset of symptoms           Core Components/Risk Factors/Patient Goals Review:   Goals and Risk Factor Review    Row Name 08/18/20 0922             Core Components/Risk Factors/Patient Goals Review   Personal Goals Review Improve shortness of breath with ADL's;Weight Management/Obesity  Review Patient is new to the program completing 2 sessions. He was referred to pulmonary rehab for increased SOB secondary to ILD. His personal goals are to return to his ADL's and increase his energy and strength. Will continue to monitor for progress.       Expected Outcomes Patient will complete the program meeting both personal and program goals.              Core Components/Risk Factors/Patient Goals at Discharge (Final Review):   Goals and Risk Factor Review - 08/18/20 0922      Core Components/Risk Factors/Patient Goals Review   Personal Goals Review Improve shortness of breath with ADL's;Weight Management/Obesity    Review Patient is new to the program completing 2 sessions. He was referred to pulmonary rehab for increased SOB secondary to ILD. His personal goals are to return to his ADL's and increase his energy and strength. Will continue to monitor for progress.    Expected Outcomes Patient will complete the program meeting both personal and program goals.           ITP Comments:   Comments: ITP REVIEW Pt is making expected progress toward pulmonary rehab goals after completing 2 sessions. Recommend continued exercise, life style modification, education, and utilization of breathing techniques to increase stamina and strength and decrease shortness of breath with exertion.

## 2020-08-21 NOTE — Progress Notes (Signed)
Daily Session Note  Patient Details  Name: Mathew Lara MRN: [id number] Date of Birth: 12/12/1960 Referring Provider:   Flowsheet Row PULMONARY REHAB OTHER RESP ORIENTATION from 08/07/2020 in [place] [facility]  Referring Provider Dr. Mannam       Encounter Date: 08/21/2020  Check In:  Session Check In - 08/21/20 1330      Check-In   Supervising physician immediately available to respond to emergencies [facility] MD immediately available    Physician(s) Dr. McDowell    Location [facility]    Staff Present Phyllis Billingsley, RN;Madison Karch, MS, Exercise Physiologist; , MS, ACSM-CEP, Exercise Physiologist    Virtual Visit No    Medication changes reported     No    Fall or balance concerns reported    No    Tobacco Cessation No Change    Warm-up and Cool-down Performed as group-led instruction    Resistance Training Performed Yes    VAD Patient? No    PAD/SET Patient? No      Pain Assessment   Currently in Pain? No/denies    Pain Score 6     Pain Location Back    Pain Orientation Lower    Pain Descriptors / Indicators Constant;Aching    Multiple Pain Sites No           Capillary Blood Glucose: No results found for this or any previous visit (from the past 24 hour(s)).    Social History   Tobacco Use  Smoking Status Former Smoker  . Packs/day: 1.00  . Years: 42.00  . Pack years: 42.00  . Types: Cigarettes  . Start date: 09/07/1971  . Quit date: 05/28/2014  . Years since quitting: 6.2  Smokeless Tobacco Never Used  Tobacco Comment   quit in November 2015 after smoking x 20 yrs.    Goals Met:  Independence with exercise equipment Exercise tolerated well No report of cardiac concerns or symptoms Strength training completed today  Goals Unmet:  Not Applicable  Comments: checkout time is 1430   Dr. Jehanzeb Memon is Medical Director for [facility]. 

## 2020-08-26 ENCOUNTER — Other Ambulatory Visit: Payer: Self-pay

## 2020-08-26 ENCOUNTER — Encounter (HOSPITAL_COMMUNITY)
Admission: RE | Admit: 2020-08-26 | Discharge: 2020-08-26 | Disposition: A | Discharge status: Home / Self Care | Payer: 59 | Source: Ambulatory Visit | Attending: Pulmonary Disease | Admitting: Pulmonary Disease

## 2020-08-26 DIAGNOSIS — J849 Interstitial pulmonary disease, unspecified: Secondary | ICD-10-CM

## 2020-08-26 NOTE — Progress Notes (Signed)
Daily Session Note  Patient Details  Name: Mathew Lara MRN: [id number] Date of Birth: Apr 13, 1961 Referring Provider:   Flowsheet Row PULMONARY REHAB OTHER RESP ORIENTATION from 08/07/2020 in [place]  Referring Provider Dr. Vaughan Browner       Encounter Date: 08/26/2020  Check In:  Session Check In - 08/26/20 1330      Check-In   Supervising physician immediately available to respond to emergencies [facility] MD immediately available    Physician(s) Dr. Domenic Polite    Location [facility]    Staff Present Cathren Harsh, MS, Exercise Physiologist;Reeya Bound Kris Mouton, MS, ACSM-CEP, Exercise Physiologist    Virtual Visit No    Medication changes reported     No    Fall or balance concerns reported    No    Tobacco Cessation No Change    Warm-up and Cool-down Performed as group-led instruction    Resistance Training Performed Yes    VAD Patient? No    PAD/SET Patient? No      Pain Assessment   Currently in Pain? No/denies    Pain Score 5     Pain Location Back    Pain Orientation Lower    Pain Descriptors / Indicators Aching;Constant    Multiple Pain Sites No           Capillary Blood Glucose: No results found for this or any previous visit (from the past 24 hour(s)).    Social History   Tobacco Use  Smoking Status Former Smoker  . Packs/day: 1.00  . Years: 42.00  . Pack years: 42.00  . Types: Cigarettes  . Start date: 09/07/1971  . Quit date: 05/28/2014  . Years since quitting: 6.2  Smokeless Tobacco Never Used  Tobacco Comment   quit in November 2015 after smoking x 20 yrs.    Goals Met:  Independence with exercise equipment Exercise tolerated well No report of cardiac concerns or symptoms Strength training completed today  Goals Unmet:  Not Applicable  Comments: checkout time is 1430   Dr. Kathie Dike is Medical Director for [facility] Pulmonary Rehab.

## 2020-08-28 ENCOUNTER — Other Ambulatory Visit: Payer: Self-pay

## 2020-08-28 ENCOUNTER — Encounter (HOSPITAL_COMMUNITY)
Admission: RE | Admit: 2020-08-28 | Discharge: 2020-08-28 | Disposition: A | Discharge status: Home / Self Care | Payer: 59 | Source: Ambulatory Visit | Attending: Pulmonary Disease | Admitting: Pulmonary Disease

## 2020-08-28 DIAGNOSIS — J849 Interstitial pulmonary disease, unspecified: Secondary | ICD-10-CM | POA: Diagnosis not present

## 2020-08-28 NOTE — Progress Notes (Signed)
Daily Session Note  Patient Details  Name: Mathew Lara MRN: [id number] Date of Birth: 1960/09/28 Referring Provider:   Flowsheet Row PULMONARY REHAB OTHER RESP ORIENTATION from 08/07/2020 in [place]  Referring Provider Dr. Vaughan Browner       Encounter Date: 08/28/2020  Check In:  Session Check In - 08/28/20 1330      Check-In   Supervising physician immediately available to respond to emergencies [facility] immediately available    Physician(s) Dr. Domenic Polite    Location [facility]    Staff Present Aundra Dubin, RN, BSN;Madison Audria Nine, MS, Exercise Physiologist    Virtual Visit No    Medication changes reported     No    Fall or balance concerns reported    No    Tobacco Cessation No Change    Warm-up and Cool-down Performed as group-led instruction    Resistance Training Performed Yes    VAD Patient? No    PAD/SET Patient? No      Pain Assessment   Currently in Pain? Yes    Pain Score 5     Pain Location Back    Pain Orientation Right    Pain Descriptors / Indicators Constant    Pain Type Chronic pain    Pain Onset More than a month ago    Aggravating Factors  Oxycondone    Multiple Pain Sites No           Capillary Blood Glucose: No results found for this or any previous visit (from the past 24 hour(s)).    Social History   Tobacco Use  Smoking Status Former Smoker  . Packs/day: 1.00  . Years: 42.00  . Pack years: 42.00  . Types: Cigarettes  . Start date: 09/07/1971  . Quit date: 05/28/2014  . Years since quitting: 6.2  Smokeless Tobacco Never Used  Tobacco Comment   quit in November 2015 after smoking x 20 yrs.    Goals Met:  Proper associated with RPD/PD & O2 Sat Independence with exercise equipment Improved SOB with ADL's Using PLB without cueing & demonstrates good technique Exercise tolerated well No report of cardiac concerns or symptoms Strength training completed today  Goals Unmet:  Not  Applicable  Comments: Check out 1430.   Dr. Kathie Dike is Medical Director for [facility] Pulmonary Rehab.

## 2020-09-02 ENCOUNTER — Encounter (HOSPITAL_COMMUNITY)
Admission: RE | Admit: 2020-09-02 | Discharge: 2020-09-02 | Disposition: A | Discharge status: Home / Self Care | Payer: 59 | Source: Ambulatory Visit | Attending: Pulmonary Disease | Admitting: Pulmonary Disease

## 2020-09-02 ENCOUNTER — Other Ambulatory Visit: Payer: Self-pay

## 2020-09-02 VITALS — Wt 291.7 lb

## 2020-09-02 DIAGNOSIS — J849 Interstitial pulmonary disease, unspecified: Secondary | ICD-10-CM | POA: Diagnosis not present

## 2020-09-02 NOTE — Progress Notes (Signed)
Daily Session Note  Patient Details  Name: Mathew Lara MRN: [id number] Date of Birth: 1961-06-24 Referring Provider:   Flowsheet Row PULMONARY REHAB OTHER RESP ORIENTATION from 08/07/2020 in [place]  Referring Provider Dr. Vaughan Browner       Encounter Date: 09/02/2020  Check In:  Session Check In - 09/02/20 1330      Check-In   Supervising physician immediately available to respond to emergencies [facility] immediately available    Physician(s) Harrington Challenger    Location [facility]    Staff Present Geanie Cooley, RN;[place] Kris Mouton, MS, ACSM-CEP, Exercise Physiologist    Virtual Visit No    Medication changes reported     No    Fall or balance concerns reported    No    Tobacco Cessation No Change    Resistance Training Performed Yes    VAD Patient? No    PAD/SET Patient? No      Pain Assessment   Currently in Pain? Yes    Pain Score 6     Pain Location Back    Pain Orientation Lower    Pain Descriptors / Indicators Constant    Pain Type Chronic pain    Pain Onset More than a month ago    Pain Frequency Constant    Pain Relieving Factors oxycondone    Multiple Pain Sites No           Capillary Blood Glucose: No results found for this or any previous visit (from the past 24 hour(s)).    Social History   Tobacco Use  Smoking Status Former Smoker  . Packs/day: 1.00  . Years: 42.00  . Pack years: 42.00  . Types: Cigarettes  . Start date: 09/07/1971  . Quit date: 05/28/2014  . Years since quitting: 6.2  Smokeless Tobacco Never Used  Tobacco Comment   quit in November 2015 after smoking x 20 yrs.    Goals Met:  Proper associated with RPD/PD & O2 Sat Independence with exercise equipment Exercise tolerated well No report of cardiac concerns or symptoms Strength training completed today  Goals Unmet:  Not Applicable  Comments: check out @ 14:30   Dr. Kathie Dike is Medical Director for [facility] Pulmonary  Rehab.

## 2020-09-04 ENCOUNTER — Encounter (HOSPITAL_COMMUNITY)
Admission: RE | Admit: 2020-09-04 | Discharge: 2020-09-04 | Disposition: A | Discharge status: Home / Self Care | Payer: 59 | Source: Ambulatory Visit | Attending: Pulmonary Disease | Admitting: Pulmonary Disease

## 2020-09-04 ENCOUNTER — Other Ambulatory Visit: Payer: Self-pay

## 2020-09-04 DIAGNOSIS — J849 Interstitial pulmonary disease, unspecified: Secondary | ICD-10-CM

## 2020-09-04 NOTE — Progress Notes (Signed)
Daily Session Note  Patient Details  Name: Mathew Lara MRN: [id number] Date of Birth: August 10, 1961 Referring Provider:   Flowsheet Row PULMONARY REHAB OTHER RESP ORIENTATION from 08/07/2020 in [place]  Referring Provider Dr. Vaughan Browner       Encounter Date: 09/04/2020  Check In:  Session Check In - 09/04/20 1330      Check-In   Supervising physician immediately available to respond to emergencies [facility] immediately available    Physician(s) Harrington Challenger    Location [facility]    Staff Present Geanie Cooley, RN;[place] Kris Mouton, MS, ACSM-CEP, Exercise Physiologist    Virtual Visit No    Medication changes reported     No    Fall or balance concerns reported    No    Tobacco Cessation No Change    Warm-up and Cool-down Performed as group-led instruction    Resistance Training Performed Yes    VAD Patient? No    PAD/SET Patient? No      Pain Assessment   Currently in Pain? No/denies    Pain Score 0-No pain    Multiple Pain Sites No           Capillary Blood Glucose: No results found for this or any previous visit (from the past 24 hour(s)).    Social History   Tobacco Use  Smoking Status Former Smoker  . Packs/day: 1.00  . Years: 42.00  . Pack years: 42.00  . Types: Cigarettes  . Start date: 09/07/1971  . Quit date: 05/28/2014  . Years since quitting: 6.2  Smokeless Tobacco Never Used  Tobacco Comment   quit in November 2015 after smoking x 20 yrs.    Goals Met:  Proper associated with RPD/PD & O2 Sat Independence with exercise equipment Exercise tolerated well No report of cardiac concerns or symptoms Strength training completed today  Goals Unmet:  Not Applicable  Comments: check out @ 14:30   Dr. Kathie Dike is Medical Director for [facility] Pulmonary Rehab.

## 2020-09-09 ENCOUNTER — Ambulatory Visit (INDEPENDENT_AMBULATORY_CARE_PROVIDER_SITE_OTHER): Payer: 59 | Admitting: Pulmonary Disease

## 2020-09-09 ENCOUNTER — Other Ambulatory Visit: Payer: Self-pay

## 2020-09-09 ENCOUNTER — Encounter (HOSPITAL_COMMUNITY)
Admission: RE | Admit: 2020-09-09 | Discharge: 2020-09-09 | Disposition: A | Discharge status: Home / Self Care | Payer: 59 | Source: Ambulatory Visit | Attending: Pulmonary Disease | Admitting: Pulmonary Disease

## 2020-09-09 ENCOUNTER — Encounter: Payer: Self-pay | Admitting: Pulmonary Disease

## 2020-09-09 VITALS — BP 152/90 | HR 88 | Temp 97.0°F | Ht 68.0 in | Wt 295.2 lb

## 2020-09-09 DIAGNOSIS — J849 Interstitial pulmonary disease, unspecified: Secondary | ICD-10-CM | POA: Insufficient documentation

## 2020-09-09 DIAGNOSIS — J449 Chronic obstructive pulmonary disease, unspecified: Secondary | ICD-10-CM

## 2020-09-09 DIAGNOSIS — Z8616 Personal history of COVID-19: Secondary | ICD-10-CM

## 2020-09-09 MED ORDER — PROAIR HFA 108 (90 BASE) MCG/ACT IN AERS: 2.0000 | INHALATION_SPRAY | RESPIRATORY_TRACT | 3 refills | Status: DC | PRN

## 2020-09-09 MED ORDER — FLUTICASONE-SALMETEROL 250-50 MCG/DOSE IN AEPB: 1.0000 | INHALATION_SPRAY | Freq: Two times a day (BID) | RESPIRATORY_TRACT | 5 refills | Status: DC

## 2020-09-09 NOTE — Addendum Note (Signed)
Addended by: Merrilee Seashore on: 09/09/2020 12:27 PM   Modules accepted: Orders

## 2020-09-09 NOTE — Progress Notes (Signed)
Daily Session Note  Patient Details  Name: Mathew Lara MRN: [id number] Date of Birth: September 29, 1960 Referring Provider:   Flowsheet Row PULMONARY REHAB OTHER RESP ORIENTATION from 08/07/2020 in [place]  Referring Provider Dr. Vaughan Browner       Encounter Date: 09/09/2020  Check In:  Session Check In - 09/09/20 1330      Check-In   Supervising physician immediately available to respond to emergencies [facility] MD immediately available    Physician(s) Dr. Domenic Polite    Location [facility]    Staff Present Geanie Cooley, RN;Madison Audria Nine, MS, Exercise Physiologist;[place] Z[place] Kris Mouton, MS, ACSM-CEP, Exercise Physiologist    Virtual Visit No    Medication changes reported     No    Fall or balance concerns reported    No    Tobacco Cessation No Change    Warm-up and Cool-down Performed as group-led instruction    Resistance Training Performed Yes    VAD Patient? No    PAD/SET Patient? No      Pain Assessment   Currently in Pain? No/denies    Pain Score 0-No pain    Multiple Pain Sites No           Capillary Blood Glucose: No results found for this or any previous visit (from the past 24 hour(s)).    Social History   Tobacco Use  Smoking Status Former Smoker  . Packs/day: 1.00  . Years: 42.00  . Pack years: 42.00  . Types: Cigarettes  . Start date: 09/07/1971  . Quit date: 05/28/2014  . Years since quitting: 6.2  Smokeless Tobacco Never Used  Tobacco Comment   quit in November 2015 after smoking x 20 yrs.    Goals Met:  Independence with exercise equipment Exercise tolerated well No report of cardiac concerns or symptoms Strength training completed today  Goals Unmet:  Not Applicable  Comments: checkout time is 1430   Dr. Kathie Dike is Medical Director for [facility] Pulmonary Rehab.

## 2020-09-09 NOTE — Progress Notes (Signed)
[facility]  Chief Complaint  Patient presents with  . Follow-up    New patient, OSA on CPAP, no currently complaints    Constitutional:  BP (!) 152/90 (BP Location: Left Arm, Cuff Size: Normal)   Pulse 88   Temp (!) 97 F (36.1 C) (Other (Comment)) Comment (Src): wrist  Ht 5\' 8"  (1.727 m)   Wt 295 lb 3.2 oz (133.9 kg)   SpO2 96% Comment: Room air  BMI 44.89 kg/m   Past Medical History:  HTN, Hep C, CKD 3b, Chronic pain, GERD, OSA, Anemia, Secondary hyperparathyroidism, Vit D deficiency  Past Surgical History:  He  has a past surgical history that includes Colonoscopy (N/A, 01/23/2014); Esophagogastroduodenoscopy (N/A, 01/23/2014); Hemorrhoid banding (01/23/2014); Mouth surgery; Total hip arthroplasty (Right, 06/04/2016); Esophagogastroduodenoscopy (N/A, 04/18/2018); Joint replacement (Right, 2017); Total hip arthroplasty (Left, 03/02/2019); and Cataract extraction (Right).  Brief Summary:  Mathew Lara is a 60 y.o. male former smoker with COPD, and COVID 39 infection in December 1194 complicated by fibrosis.      Subjective:   He was previously followed by Dr. Vaughan Browner.  Switching to [facility] based provider.  Enrolled in pulmonary rehab at [facility].  This has helped.  Was using dulera.  This helped but too expensive.  Has occasional cough and wheeze.  Not having fever, sputum, hemoptysis, or leg swelling.  Uses Bipap and 2 liters oxygen at night.  Gets supplies from [employer] and these are refilled by his PCP.  Physical Exam:   Appearance - well kempt   ENMT - no sinus tenderness, no oral exudate, no LAN, Mallampati 4 airway, no stridor, wears dentures  Respiratory - equal breath sounds bilaterally, no wheezing or rales  CV - s1s2 regular rate and rhythm, no murmurs  Ext - no clubbing, no edema  Skin - no rashes  Psych - normal mood and affect   Pulmonary testing:   PFT 02/12/20 >> FEV1 2.37 (81%), FEV1% 76, TLC 4.98 (75%),  DLCO 38%  Chest Imaging:   HRCT chest 01/31/20 >> moderate centrilobular and paraseptal emphysema, minimal patchy subpleural reticulation and GGO in dependent lung zones  Sleep Tests:   PSG 10/30/14 >> AHI 106, SpO2 low 70%; Bipap 15/11 with 1 liter oxygen  Cardiac Tests:   Echo 12/25/19 >> EF 60 to 65%, severe LVH, grade 1 DD  Social History:  He  reports that he quit smoking about 6 years ago. His smoking use included cigarettes. He started smoking about 49 years ago. He has a 42.00 pack-year smoking history. He has never used smokeless tobacco. He reports previous alcohol use. He reports that he does not use drugs.  Family History:  His family history includes Lung cancer in his mother.     Assessment/Plan:   COPD with emphysema. 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 - has f/u CT chest in May 2022  Obstructive sleep apnea with nocturnal hypoxemia. - gets supplies through [employer] and refilled by his PCP - on Bipap and 2 liters oxygen at night - he reports compliance with therapy and benefit from therapy  Obesity. - discussed importance of weight loss  CKD 3b. - followed by Dr. Ulice Bold with [facility]  Time Spent Involved in Patient Care on Day of Examination:  33 minutes  Follow up:  Patient Instructions  Fluticasone-salmeterol (advair) one puff in the  morning and one puff in the evening; rinse mouth after each use  Proair two puffs every 6 hours as needed for cough, wheeze, or chest congestion  Follow up in May 2022 after you have your CT chest completed   Medication List:   Allergies as of 09/09/2020   No Known Allergies     Medication List       Accurate as of September 09, 2020 11:42 AM. If you have any questions, ask your nurse or doctor.        STOP taking these medications   budesonide-formoterol 80-4.5  MCG/ACT inhaler Commonly known as: Symbicort Stopped by: Chesley Mires, MD   Ruthe Mannan 200-5 MCG/ACT Aero Generic drug: mometasone-formoterol Stopped by: Chesley Mires, MD   Ipratropium-Albuterol 20-100 MCG/ACT Aers respimat Commonly known as: COMBIVENT Stopped by: Chesley Mires, MD     TAKE these medications   acetaminophen 325 MG tablet Commonly known as: TYLENOL Take 2 tablets (650 mg total) by mouth every 6 (six) hours as needed for mild pain or headache (fever >/= 101).   amLODipine 10 MG tablet Commonly known as: NORVASC Take 10 mg by mouth daily.   ascorbic acid 500 MG tablet Commonly known as: VITAMIN C Take 1 tablet (500 mg total) by mouth daily.   aspirin EC 81 MG tablet Take 81 mg by mouth daily.   DULoxetine 30 MG capsule Commonly known as: CYMBALTA Take 60 mg by mouth daily.   Fluticasone-Salmeterol 250-50 MCG/DOSE Aepb Commonly known as: ADVAIR Inhale 1 puff into the lungs 2 (two) times daily. Started by: Chesley Mires, MD   HYDROcodone-acetaminophen 5-325 MG tablet Commonly known as: NORCO/VICODIN Take 1 tablet by mouth every 6 (six) hours as needed for moderate pain.   hydrOXYzine 25 MG tablet Commonly known as: ATARAX/VISTARIL Take 25 mg by mouth 3 (three) times daily as needed for anxiety.   loratadine 10 MG tablet Commonly known as: CLARITIN Take 1 tablet (10 mg total) by mouth daily.   losartan 25 MG tablet Commonly known as: COZAAR Take 25 mg by mouth daily.   oxyCODONE 5 MG immediate release tablet Commonly known as: Oxy IR/ROXICODONE Take 5 mg by mouth every 8 (eight) hours as needed.   ProAir HFA 108 (90 Base) MCG/ACT inhaler Generic drug: albuterol Inhale 2 puffs into the lungs every 4 (four) hours as needed for wheezing or shortness of breath.   sodium bicarbonate 650 MG tablet Take 650 mg by mouth 3 (three) times daily.   zinc sulfate 220 (50 Zn) MG capsule Take 1 capsule (220 mg total) by mouth daily.       Signature:  Chesley Mires, MD [place] Pager - [phone] 09/09/2020, 11:42 AM

## 2020-09-09 NOTE — Patient Instructions (Addendum)
Fluticasone-salmeterol (advair) one puff in the morning and one puff in the evening; rinse mouth after each use  Proair two puffs every 6 hours as needed for cough, wheeze, or chest congestion  Follow up in May 2022 after you have your CT chest completed

## 2020-09-11 ENCOUNTER — Other Ambulatory Visit: Payer: Self-pay

## 2020-09-11 ENCOUNTER — Encounter (HOSPITAL_COMMUNITY)
Admission: RE | Admit: 2020-09-11 | Discharge: 2020-09-11 | Disposition: A | Discharge status: Home / Self Care | Payer: 59 | Source: Ambulatory Visit | Attending: Pulmonary Disease | Admitting: Pulmonary Disease

## 2020-09-11 DIAGNOSIS — J849 Interstitial pulmonary disease, unspecified: Secondary | ICD-10-CM

## 2020-09-11 NOTE — Progress Notes (Signed)
Daily Session Note  Patient Details  Name: Mathew Lara MRN: [id number] Date of Birth: [birth year] Referring Provider:   Flowsheet Row PULMONARY REHAB OTHER RESP ORIENTATION from 08/07/2020 in [facility]  Referring Provider Dr. Vaughan Browner       Encounter Date: 09/11/2020  Check In:  Session Check In - 09/11/20 1330      Check-In   Supervising physician immediately available to respond to emergencies [facility] MD immediately available    Physician(s) Domenic Polite    Location [facility]    Staff Present Geanie Cooley, RN;Madison Audria Nine, MS, Exercise Physiologist    Virtual Visit No    Medication changes reported     No    Fall or balance concerns reported    No    Tobacco Cessation No Change    Warm-up and Cool-down Performed as group-led instruction    Resistance Training Performed Yes    VAD Patient? No    PAD/SET Patient? No      Pain Assessment   Currently in Pain? No/denies    Pain Score 0-No pain    Multiple Pain Sites No           Capillary Blood Glucose: No results found for this or any previous visit (from the past 24 hour(s)).    Social History   Tobacco Use  Smoking Status Former Smoker  . Packs/day: 1.00  . Years: 42.00  . Pack years: 42.00  . Types: Cigarettes  . Start date: 09/07/1971  . Quit date: 05/28/2014  . Years since quitting: 6.2  Smokeless Tobacco Never Used  Tobacco Comment   quit in November 2015 after smoking x 20 yrs.    Goals Met:  Proper associated with RPD/PD & O2 Sat Independence with exercise equipment Exercise tolerated well No report of cardiac concerns or symptoms Strength training completed today  Goals Unmet:  Not Applicable  Comments: check out @ 14:30   Dr. Kathie Dike is Medical Director for [facility].

## 2020-09-16 ENCOUNTER — Other Ambulatory Visit: Payer: Self-pay

## 2020-09-16 ENCOUNTER — Encounter (HOSPITAL_COMMUNITY)
Admission: RE | Admit: 2020-09-16 | Discharge: 2020-09-16 | Disposition: A | Discharge status: Home / Self Care | Payer: 59 | Source: Ambulatory Visit | Attending: Pulmonary Disease | Admitting: Pulmonary Disease

## 2020-09-16 VITALS — Wt 291.7 lb

## 2020-09-16 DIAGNOSIS — J849 Interstitial pulmonary disease, unspecified: Secondary | ICD-10-CM

## 2020-09-16 NOTE — Progress Notes (Signed)
Daily Session Note  Patient Details  Name: Mathew Lara MRN: [id number] Date of Birth: [birth year] Referring Provider:   Flowsheet Row PULMONARY REHAB OTHER RESP ORIENTATION from 08/07/2020 in [facility]  Referring Provider Dr. Vaughan Browner       Encounter Date: 09/16/2020  Check In:  Session Check In - 09/16/20 1330      Check-In   Supervising physician immediately available to respond to emergencies [facility] MD immediately available    Physician(s) Domenic Polite    Location [facility]    Staff Present Geanie Cooley, RN;Madison Audria Nine, MS, Exercise Physiologist;Analee Montee Kris Mouton, MS, ACSM-CEP, Exercise Physiologist    Virtual Visit No    Medication changes reported     No    Fall or balance concerns reported    No    Tobacco Cessation No Change    Warm-up and Cool-down Performed as group-led instruction    Resistance Training Performed Yes    VAD Patient? No    PAD/SET Patient? No      Pain Assessment   Currently in Pain? No/denies    Pain Score 0-No pain    Multiple Pain Sites No           Capillary Blood Glucose: No results found for this or any previous visit (from the past 24 hour(s)).    Social History   Tobacco Use  Smoking Status Former Smoker  . Packs/day: 1.00  . Years: 42.00  . Pack years: 42.00  . Types: Cigarettes  . Start date: 09/07/1971  . Quit date: 05/28/2014  . Years since quitting: 6.3  Smokeless Tobacco Never Used  Tobacco Comment   quit in November 2015 after smoking x 20 yrs.    Goals Met:  Independence with exercise equipment Exercise tolerated well No report of cardiac concerns or symptoms Strength training completed today  Goals Unmet:  Not Applicable  Comments: checkout time is 1430   Dr. Kathie Dike is Medical Director for [facility].

## 2020-09-18 ENCOUNTER — Other Ambulatory Visit: Payer: Self-pay

## 2020-09-18 ENCOUNTER — Encounter (HOSPITAL_COMMUNITY)
Admission: RE | Admit: 2020-09-18 | Discharge: 2020-09-18 | Disposition: A | Discharge status: Home / Self Care | Payer: 59 | Source: Ambulatory Visit | Attending: Pulmonary Disease | Admitting: Pulmonary Disease

## 2020-09-18 DIAGNOSIS — J849 Interstitial pulmonary disease, unspecified: Secondary | ICD-10-CM

## 2020-09-18 NOTE — Progress Notes (Signed)
Pulmonary Individual Treatment Plan  Patient Details  Name: OLVIN ROHR MRN: [id number] Date of Birth: 04/01/1961 Referring Provider:   [place] from 08/07/2020 in [place]  Referring Provider Dr. Vaughan Browner       Initial Encounter Date:  Flowsheet Row PULMONARY REHAB OTHER RESP ORIENTATION from 08/07/2020 in [place]  Date 08/07/20      Visit Diagnosis: Interstitial lung disease ([place])  Patient's Home Medications on Admission:   Current Outpatient Medications:  .  acetaminophen (TYLENOL) 325 MG tablet, Take 2 tablets (650 mg total) by mouth every 6 (six) hours as needed for mild pain or headache (fever >/= 101)., Disp: 12 tablet, Rfl: 0 .  amLODipine (NORVASC) 10 MG tablet, Take 10 mg by mouth daily., Disp: , Rfl:  .  ascorbic acid (VITAMIN C) 500 MG tablet, Take 1 tablet (500 mg total) by mouth daily., Disp: 30 tablet, Rfl: 1 .  aspirin EC 81 MG tablet, Take 81 mg by mouth daily., Disp: , Rfl:  .  DULoxetine (CYMBALTA) 30 MG capsule, Take 60 mg by mouth daily., Disp: , Rfl:  .  Fluticasone-Salmeterol (ADVAIR) 250-50 MCG/DOSE AEPB, Inhale 1 puff into the lungs 2 (two) times daily., Disp: 60 each, Rfl: 5 .  HYDROcodone-acetaminophen (NORCO/VICODIN) 5-325 MG tablet, Take 1 tablet by mouth every 6 (six) hours as needed for moderate pain., Disp: , Rfl:  .  hydrOXYzine (ATARAX/VISTARIL) 25 MG tablet, Take 25 mg by mouth 3 (three) times daily as needed for anxiety., Disp: , Rfl:  .  loratadine (CLARITIN) 10 MG tablet, Take 1 tablet (10 mg total) by mouth daily., Disp: 30 tablet, Rfl: 1 .  losartan (COZAAR) 25 MG tablet, Take 25 mg by mouth daily. , Disp: , Rfl:  .  oxyCODONE (OXY IR/ROXICODONE) 5 MG immediate release tablet, Take 5 mg by mouth every 8 (eight) hours as needed., Disp: , Rfl:  .  PROAIR HFA 108 (90 Base) MCG/ACT inhaler, Inhale 2 puffs into the lungs every 4 (four) hours as needed for  wheezing or shortness of breath., Disp: 18 g, Rfl: 3 .  sodium bicarbonate 650 MG tablet, Take 650 mg by mouth 3 (three) times daily., Disp: , Rfl:  .  zinc sulfate 220 (50 Zn) MG capsule, Take 1 capsule (220 mg total) by mouth daily., Disp: 30 capsule, Rfl: 1  Past Medical History: Past Medical History:  Diagnosis Date  . Alcohol abuse   . Anxiety   . Bronchitis   . Chronic hip pain   . CKD (chronic kidney disease) stage 3, GFR 30-59 ml/min (HCC)   . COPD (chronic obstructive pulmonary disease) ([place])   . Essential hypertension   . Hepatitis C   . Mixed hyperlipidemia   . Sleep apnea     Tobacco Use: Social History   Tobacco Use  Smoking Status Former Smoker  . Packs/day: 1.00  . Years: 42.00  . Pack years: 42.00  . Types: Cigarettes  . Start date: 09/07/1971  . Quit date: 05/28/2014  . Years since quitting: 6.3  Smokeless Tobacco Never Used  Tobacco Comment   quit in November 2015 after smoking x 20 yrs.    Labs: Recent Review Flowsheet Data    Labs for ITP Cardiac and Pulmonary Rehab Latest Ref Rng & Units 08/29/2019 09/28/2019   Trlycerides <150 mg/dL 199(H) -   PHART 7.350 - 7.450 7.393 7.343(L)    PCO2ART 32.0 - 48.0 mmHg 40.5 45.5  HCO3 20.0 - 28.0 mmol/L 24.2 24.1    ACIDBASEDEF 0.0 - 2.0 mmol/L 0.1 1.3    O2SAT % 90.7 94.1       Capillary Blood Glucose: Lab Results  Component Value Date   GLUCAP 144 (H) 03/02/2019     Pulmonary Assessment Scores:  Pulmonary Assessment Scores    Row Name 08/07/20 1257         ADL UCSD   ADL Phase Entry     SOB Score total 76           CAT Score   CAT Score 28           mMRC Score   mMRC Score 2           UCSD: Self-administered rating of dyspnea associated with activities of daily living (ADLs) 6-point scale (0 = "not at all" to 5 = "maximal or unable to do because of breathlessness")  Scoring Scores range from 0 to 120.  Minimally important difference is 5 units  CAT: CAT can identify the health  impairment of COPD patients and is better correlated with disease progression.  CAT has a scoring range of zero to 40. The CAT score is classified into four groups of low (less than 10), medium (10 - 20), high (21-30) and very high (31-40) based on the impact level of disease on health status. A CAT score over 10 suggests significant symptoms.  A worsening CAT score could be explained by an exacerbation, poor medication adherence, poor inhaler technique, or progression of COPD or comorbid conditions.  CAT MCID is 2 points  mMRC: mMRC (Modified Medical Research Council) Dyspnea Scale is used to assess the degree of baseline functional disability in patients of respiratory disease due to dyspnea. No minimal important difference is established. A decrease in score of 1 point or greater is considered a positive change.   Pulmonary Function Assessment:  Pulmonary Function Assessment - 08/07/20 1246      Breath   Shortness of Breath No           Exercise Target Goals: Exercise Program Goal: Individual exercise prescription set using results from initial 6 min walk test and THRR while considering  patient's activity barriers and safety.   Exercise Prescription Goal: Initial exercise prescription builds to 30-45 minutes a day of aerobic activity, 2-3 days per week.  Home exercise guidelines will be given to patient during program as part of exercise prescription that the participant will acknowledge.  Activity Barriers & Risk Stratification:  Activity Barriers & Cardiac Risk Stratification - 08/07/20 1244      Activity Barriers & Cardiac Risk Stratification   Activity Barriers Arthritis;Back Problems;Left Hip Replacement;Right Hip Replacement;Deconditioning    Cardiac Risk Stratification High           6 Minute Walk:  6 Minute Walk    Row Name 08/07/20 1546         6 Minute Walk   Phase Initial     Distance 900 feet     Walk Time 6 minutes     # of Rest Breaks 1     MPH 1.71      METS 2.67     RPE 10     Perceived Dyspnea  14     VO2 Peak 9.36     Symptoms Yes (comment)     Comments Stopped for ~30 sec due to desaturation to put oxygen on     Resting HR 104 bpm  Resting BP 112/70     Resting Oxygen Saturation  93 %     Exercise Oxygen Saturation  during 6 min walk 85 %     Max Ex. HR 141 bpm     Max Ex. BP 184/68     2 Minute Post BP 144/72           Interval HR   1 Minute HR 110     2 Minute HR 125     3 Minute HR 129     4 Minute HR 133     5 Minute HR 133     6 Minute HR 141     2 Minute Post HR 126     Interval Heart Rate? Yes           Interval Oxygen   Interval Oxygen? Yes     Baseline Oxygen Saturation % 93 %     1 Minute Oxygen Saturation % 85 %     1 Minute Liters of Oxygen 0 L     2 Minute Oxygen Saturation % 91 %     2 Minute Liters of Oxygen 2 L     3 Minute Oxygen Saturation % 90 %     3 Minute Liters of Oxygen 2 L     4 Minute Oxygen Saturation % 88 %     4 Minute Liters of Oxygen 2 L     5 Minute Oxygen Saturation % 88 %     5 Minute Liters of Oxygen 2 L     6 Minute Oxygen Saturation % 87 %     6 Minute Liters of Oxygen 2 L     2 Minute Post Oxygen Saturation % 97 %     2 Minute Post Liters of Oxygen 2 L            Oxygen Initial Assessment:  Oxygen Initial Assessment - 08/07/20 1545      Initial 6 min Walk   Oxygen Used Continuous    Liters per minute 2      Program Oxygen Prescription   Program Oxygen Prescription Continuous    Liters per minute 2      Intervention   Short Term Goals To learn and exhibit compliance with exercise, home and travel O2 prescription;To learn and understand importance of monitoring SPO2 with pulse oximeter and demonstrate accurate use of the pulse oximeter.;To learn and understand importance of maintaining oxygen saturations>88%;To learn and demonstrate proper pursed lip breathing techniques or other breathing techniques.;To learn and demonstrate proper use of respiratory  medications    Long  Term Goals Exhibits compliance with exercise, home and travel O2 prescription;Verbalizes importance of monitoring SPO2 with pulse oximeter and return demonstration;Maintenance of O2 saturations>88%;Exhibits proper breathing techniques, such as pursed lip breathing or other method taught during program session;Compliance with respiratory medication;Demonstrates proper use of MDI's           Oxygen Re-Evaluation:  Oxygen Re-Evaluation    Row Name 08/19/20 1451 09/16/20 1522           Program Oxygen Prescription   Program Oxygen Prescription Continuous Continuous      Liters per minute 2 2             Home Oxygen   Home Oxygen Device E-Tanks;Home Concentrator E-Tanks;Home Concentrator      Sleep Oxygen Prescription CPAP CPAP      Home Exercise Oxygen Prescription Continuous Continuous      Liters per  minute 2 2      Home Resting Oxygen Prescription None None      Compliance with Home Oxygen Use Yes Yes             Goals/Expected Outcomes   Short Term Goals To learn and exhibit compliance with exercise, home and travel O2 prescription;To learn and understand importance of monitoring SPO2 with pulse oximeter and demonstrate accurate use of the pulse oximeter.;To learn and understand importance of maintaining oxygen saturations>88%;To learn and demonstrate proper pursed lip breathing techniques or other breathing techniques.;To learn and demonstrate proper use of respiratory medications To learn and exhibit compliance with exercise, home and travel O2 prescription;To learn and understand importance of monitoring SPO2 with pulse oximeter and demonstrate accurate use of the pulse oximeter.;To learn and understand importance of maintaining oxygen saturations>88%;To learn and demonstrate proper pursed lip breathing techniques or other breathing techniques.;To learn and demonstrate proper use of respiratory medications      Long  Term Goals Exhibits compliance with  exercise, home and travel O2 prescription;Verbalizes importance of monitoring SPO2 with pulse oximeter and return demonstration;Maintenance of O2 saturations>88%;Exhibits proper breathing techniques, such as pursed lip breathing or other method taught during program session;Compliance with respiratory medication;Demonstrates proper use of MDI's Exhibits compliance with exercise, home and travel O2 prescription;Verbalizes importance of monitoring SPO2 with pulse oximeter and return demonstration;Maintenance of O2 saturations>88%;Exhibits proper breathing techniques, such as pursed lip breathing or other method taught during program session;Compliance with respiratory medication;Demonstrates proper use of MDI's      Goals/Expected Outcomes compliance compliance             Oxygen Discharge (Final Oxygen Re-Evaluation):  Oxygen Re-Evaluation - 09/16/20 1522      Program Oxygen Prescription   Program Oxygen Prescription Continuous    Liters per minute 2      Home Oxygen   Home Oxygen Device E-Tanks;Home Concentrator    Sleep Oxygen Prescription CPAP    Home Exercise Oxygen Prescription Continuous    Liters per minute 2    Home Resting Oxygen Prescription None    Compliance with Home Oxygen Use Yes      Goals/Expected Outcomes   Short Term Goals To learn and exhibit compliance with exercise, home and travel O2 prescription;To learn and understand importance of monitoring SPO2 with pulse oximeter and demonstrate accurate use of the pulse oximeter.;To learn and understand importance of maintaining oxygen saturations>88%;To learn and demonstrate proper pursed lip breathing techniques or other breathing techniques.;To learn and demonstrate proper use of respiratory medications    Long  Term Goals Exhibits compliance with exercise, home and travel O2 prescription;Verbalizes importance of monitoring SPO2 with pulse oximeter and return demonstration;Maintenance of O2 saturations>88%;Exhibits proper  breathing techniques, such as pursed lip breathing or other method taught during program session;Compliance with respiratory medication;Demonstrates proper use of MDI's    Goals/Expected Outcomes compliance           Initial Exercise Prescription:  Initial Exercise Prescription - 08/07/20 1500      Date of Initial Exercise RX and Referring Provider   Date 08/07/20    Referring Provider Dr. Vaughan Browner     Expected Discharge Date 12/11/20      Oxygen   Oxygen Continuous    Liters 2      NuStep   Level 1    SPM 80    Minutes 17      Arm Ergometer   Level 1    RPM 60    Minutes  22      Prescription Details   Frequency (times per week) 2    Duration Progress to 30 minutes of continuous aerobic without signs/symptoms of physical distress      Intensity   THRR 40-80% of Max Heartrate 64-128    Ratings of Perceived Exertion 11-13    Perceived Dyspnea 0-4      Resistance Training   Training Prescription Yes    Weight 3 lbs    Reps 10-15           Perform Capillary Blood Glucose checks as needed.  Exercise Prescription Changes:  Exercise Prescription Changes    Row Name 08/19/20 1400 09/02/20 1400 09/16/20 1500         Response to Exercise   Blood Pressure (Admit) 118/80 128/76 128/84     Blood Pressure (Exercise) 152/70 150/90 134/84     Blood Pressure (Exit) 118/70 124/70 120/80     Heart Rate (Admit) 98 bpm 95 bpm 94 bpm     Heart Rate (Exercise) 115 bpm 118 bpm 111 bpm     Heart Rate (Exit) 105 bpm 93 bpm 102 bpm     Oxygen Saturation (Admit) 93 % 96 % 97 %     Oxygen Saturation (Exercise) 96 % 96 % 94 %     Oxygen Saturation (Exit) 97 % 97 % 97 %     Rating of Perceived Exertion (Exercise) 12 12 12      Perceived Dyspnea (Exercise) 6 9 9      Duration Continue with 30 min of aerobic exercise without signs/symptoms of physical distress. Continue with 30 min of aerobic exercise without signs/symptoms of physical distress. Continue with 30 min of aerobic exercise  without signs/symptoms of physical distress.     Intensity THRR unchanged THRR unchanged THRR unchanged           Progression   Progression Continue to progress workloads to maintain intensity without signs/symptoms of physical distress. Continue to progress workloads to maintain intensity without signs/symptoms of physical distress. Continue to progress workloads to maintain intensity without signs/symptoms of physical distress.           Resistance Training   Training Prescription Yes Yes Yes     Weight 3 lbs 4 lbs 4 lbs     Reps 10-15 10-15 10-15     Time - 10 Minutes 10 Minutes           Oxygen   Oxygen Continuous Continuous Continuous     Liters 2 2 2            NuStep   Level 1 1 2      SPM 86 96 94     Minutes 22 22 17      METs 1.9 2.1 2.1           Arm Ergometer   Level 1 2 2      RPM 46 43 48     Minutes 17 17 22      METs 1.4 1.5 1.6            Exercise Comments:   Exercise Goals and Review:  Exercise Goals    Row Name 08/07/20 1550 08/19/20 1454 09/16/20 1525         Exercise Goals   Increase Physical Activity Yes Yes Yes     Intervention Provide advice, education, support and counseling about physical activity/exercise needs.;Develop an individualized exercise prescription for aerobic and resistive training based on initial evaluation findings, risk stratification, comorbidities and  participant's personal goals. Provide advice, education, support and counseling about physical activity/exercise needs.;Develop an individualized exercise prescription for aerobic and resistive training based on initial evaluation findings, risk stratification, comorbidities and participant's personal goals. Provide advice, education, support and counseling about physical activity/exercise needs.;Develop an individualized exercise prescription for aerobic and resistive training based on initial evaluation findings, risk stratification, comorbidities and participant's personal goals.      Expected Outcomes Short Term: Attend rehab on a regular basis to increase amount of physical activity.;Long Term: Add in home exercise to make exercise part of routine and to increase amount of physical activity.;Long Term: Exercising regularly at least 3-5 days a week. Short Term: Attend rehab on a regular basis to increase amount of physical activity.;Long Term: Add in home exercise to make exercise part of routine and to increase amount of physical activity.;Long Term: Exercising regularly at least 3-5 days a week. Short Term: Attend rehab on a regular basis to increase amount of physical activity.;Long Term: Add in home exercise to make exercise part of routine and to increase amount of physical activity.;Long Term: Exercising regularly at least 3-5 days a week.     Increase Strength and Stamina Yes Yes Yes     Intervention Provide advice, education, support and counseling about physical activity/exercise needs.;Develop an individualized exercise prescription for aerobic and resistive training based on initial evaluation findings, risk stratification, comorbidities and participant's personal goals. Provide advice, education, support and counseling about physical activity/exercise needs.;Develop an individualized exercise prescription for aerobic and resistive training based on initial evaluation findings, risk stratification, comorbidities and participant's personal goals. Provide advice, education, support and counseling about physical activity/exercise needs.;Develop an individualized exercise prescription for aerobic and resistive training based on initial evaluation findings, risk stratification, comorbidities and participant's personal goals.     Expected Outcomes Short Term: Increase workloads from initial exercise prescription for resistance, speed, and METs.;Short Term: Perform resistance training exercises routinely during rehab and add in resistance training at home;Long Term: Improve  cardiorespiratory fitness, muscular endurance and strength as measured by increased METs and functional capacity (6MWT) Short Term: Increase workloads from initial exercise prescription for resistance, speed, and METs.;Short Term: Perform resistance training exercises routinely during rehab and add in resistance training at home;Long Term: Improve cardiorespiratory fitness, muscular endurance and strength as measured by increased METs and functional capacity (6MWT) Short Term: Increase workloads from initial exercise prescription for resistance, speed, and METs.;Short Term: Perform resistance training exercises routinely during rehab and add in resistance training at home;Long Term: Improve cardiorespiratory fitness, muscular endurance and strength as measured by increased METs and functional capacity (6MWT)     Able to understand and use rate of perceived exertion (RPE) scale Yes Yes Yes     Intervention Provide education and explanation on how to use RPE scale Provide education and explanation on how to use RPE scale Provide education and explanation on how to use RPE scale     Expected Outcomes Short Term: Able to use RPE daily in rehab to express subjective intensity level;Long Term:  Able to use RPE to guide intensity level when exercising independently Short Term: Able to use RPE daily in rehab to express subjective intensity level;Long Term:  Able to use RPE to guide intensity level when exercising independently Short Term: Able to use RPE daily in rehab to express subjective intensity level;Long Term:  Able to use RPE to guide intensity level when exercising independently     Able to understand and use Dyspnea scale Yes Yes Yes  Intervention Provide education and explanation on how to use Dyspnea scale Provide education and explanation on how to use Dyspnea scale Provide education and explanation on how to use Dyspnea scale     Expected Outcomes Short Term: Able to use Dyspnea scale daily in rehab  to express subjective sense of shortness of breath during exertion;Long Term: Able to use Dyspnea scale to guide intensity level when exercising independently Short Term: Able to use Dyspnea scale daily in rehab to express subjective sense of shortness of breath during exertion;Long Term: Able to use Dyspnea scale to guide intensity level when exercising independently Short Term: Able to use Dyspnea scale daily in rehab to express subjective sense of shortness of breath during exertion;Long Term: Able to use Dyspnea scale to guide intensity level when exercising independently     Knowledge and understanding of Target Heart Rate Range (THRR) Yes Yes Yes     Intervention Provide education and explanation of THRR including how the numbers were predicted and where they are located for reference Provide education and explanation of THRR including how the numbers were predicted and where they are located for reference Provide education and explanation of THRR including how the numbers were predicted and where they are located for reference     Expected Outcomes Short Term: Able to state/look up THRR;Long Term: Able to use THRR to govern intensity when exercising independently;Short Term: Able to use daily as guideline for intensity in rehab Short Term: Able to state/look up THRR;Long Term: Able to use THRR to govern intensity when exercising independently;Short Term: Able to use daily as guideline for intensity in rehab Short Term: Able to state/look up THRR;Long Term: Able to use THRR to govern intensity when exercising independently;Short Term: Able to use daily as guideline for intensity in rehab     Understanding of Exercise Prescription Yes Yes Yes     Intervention Provide education, explanation, and written materials on patient's individual exercise prescription Provide education, explanation, and written materials on patient's individual exercise prescription Provide education, explanation, and written  materials on patient's individual exercise prescription     Expected Outcomes Short Term: Able to explain program exercise prescription;Long Term: Able to explain home exercise prescription to exercise independently Short Term: Able to explain program exercise prescription;Long Term: Able to explain home exercise prescription to exercise independently Short Term: Able to explain program exercise prescription;Long Term: Able to explain home exercise prescription to exercise independently            Exercise Goals Re-Evaluation :  Exercise Goals Re-Evaluation    Row Name 08/19/20 1455 09/16/20 1525           Exercise Goal Re-Evaluation   Exercise Goals Review Increase Physical Activity;Increase Strength and Stamina;Able to understand and use rate of perceived exertion (RPE) scale;Able to understand and use Dyspnea scale;Knowledge and understanding of Target Heart Rate Range (THRR);Understanding of Exercise Prescription Increase Physical Activity;Increase Strength and Stamina;Able to understand and use rate of perceived exertion (RPE) scale;Able to understand and use Dyspnea scale;Knowledge and understanding of Target Heart Rate Range (THRR);Understanding of Exercise Prescription      Comments Pt has attended 3 exercise sessions. He maintains a positive attitude and gives good effort. He is currently exercising at 1.9 METs on the stepper. Will continue to monitor and progress as able. Pt has attended 11 exercise sessions. He reports that he feels like he is getting stronger and is able to do his ADL's with less shortness of breath. He currently exercises  at 2.1 METs on the stepper. Will continue to monitor and progress as able.      Expected Outcomes Through exercise at rehab and by engaging in a home exercise program, the patient will meet their stated goals. Through exercise at rehab and by engaging in a home exercise program, the patient will meet their stated goals.             Discharge  Exercise Prescription (Final Exercise Prescription Changes):  Exercise Prescription Changes - 09/16/20 1500      Response to Exercise   Blood Pressure (Admit) 128/84    Blood Pressure (Exercise) 134/84    Blood Pressure (Exit) 120/80    Heart Rate (Admit) 94 bpm    Heart Rate (Exercise) 111 bpm    Heart Rate (Exit) 102 bpm    Oxygen Saturation (Admit) 97 %    Oxygen Saturation (Exercise) 94 %    Oxygen Saturation (Exit) 97 %    Rating of Perceived Exertion (Exercise) 12    Perceived Dyspnea (Exercise) 9    Duration Continue with 30 min of aerobic exercise without signs/symptoms of physical distress.    Intensity THRR unchanged      Progression   Progression Continue to progress workloads to maintain intensity without signs/symptoms of physical distress.      Resistance Training   Training Prescription Yes    Weight 4 lbs    Reps 10-15    Time 10 Minutes      Oxygen   Oxygen Continuous    Liters 2      NuStep   Level 2    SPM 94    Minutes 17    METs 2.1      Arm Ergometer   Level 2    RPM 48    Minutes 22    METs 1.6           Nutrition:  Target Goals: Understanding of nutrition guidelines, daily intake of sodium 1500mg , cholesterol 200mg , calories 30% from fat and 7% or less from saturated fats, daily to have 5 or more servings of fruits and vegetables.  Biometrics:  Pre Biometrics - 08/07/20 1550      Pre Biometrics   Height 5\' 8"  (1.727 m)    Weight 134.4 kg    Waist Circumference 53.5 inches    Hip Circumference 54 inches    Waist to Hip Ratio 0.99 %    BMI (Calculated) 45.06    Triceps Skinfold 14 mm    % Body Fat 40 %    Grip Strength 28.2 kg    Flexibility 0 in    Single Leg Stand 2 seconds            Nutrition Therapy Plan and Nutrition Goals:  Nutrition Therapy & Goals - 09/12/20 0952      Personal Nutrition Goals   Comments We continue to provide nutritional education through handouts.      Intervention Plan   Intervention  Nutrition handout(s) given to patient.           Nutrition Assessments:  Nutrition Assessments - 08/07/20 1305      MEDFICTS Scores   Pre Score 15          MEDIFICTS Score Key:  ?70 Need to make dietary changes   40-70 Heart Healthy Diet  ? 40 Therapeutic Level Cholesterol Diet   Picture Your Plate Scores:  <97 Unhealthy dietary pattern with much room for improvement.  41-50 Dietary pattern unlikely to  meet recommendations for good health and room for improvement.  51-60 More healthful dietary pattern, with some room for improvement.   >60 Healthy dietary pattern, although there may be some specific behaviors that could be improved.    Nutrition Goals Re-Evaluation:   Nutrition Goals Discharge (Final Nutrition Goals Re-Evaluation):   Psychosocial: Target Goals: Acknowledge presence or absence of significant depression and/or stress, maximize coping skills, provide positive support system. Participant is able to verbalize types and ability to use techniques and skills needed for reducing stress and depression.  Initial Review & Psychosocial Screening:  Initial Psych Review & Screening - 08/07/20 1249      Initial Review   Current issues with None Identified      Family Dynamics   Good Support System? Yes    Comments His daughter is his support system. She lives nearby. He sees her frequently and they call each other frequently.      Barriers   Psychosocial barriers to participate in program There are no identifiable barriers or psychosocial needs.      Screening Interventions   Interventions Encouraged to exercise    Expected Outcomes Long Term Goal: Stressors or current issues are controlled or eliminated.;Short Term goal: Identification and review with participant of any Quality of Life or Depression concerns found by scoring the questionnaire.           Quality of Life Scores:  Quality of Life - 08/07/20 1544      Quality of Life   Select Quality  of Life      Quality of Life Scores   Health/Function Pre 11.14 %    Socioeconomic Pre 13.5 %    Psych/Spiritual Pre 18.86 %    Family Pre 24 %    GLOBAL Pre 15.18 %          Scores of 19 and below usually indicate a poorer quality of life in these areas.  A difference of  2-3 points is a clinically meaningful difference.  A difference of 2-3 points in the total score of the Quality of Life Index has been associated with significant improvement in overall quality of life, self-image, physical symptoms, and general health in studies assessing change in quality of life.   PHQ-9: Recent Review Flowsheet Data    Depression screen [facility] 2/9 08/07/2020   Decreased Interest 1   Down, Depressed, Hopeless 1   PHQ - 2 Score 2   Altered sleeping 0   Tired, decreased energy 3   Change in appetite 0   Feeling bad or failure about yourself  1   Trouble concentrating 0   Moving slowly or fidgety/restless 1   Suicidal thoughts 0   PHQ-9 Score 7   Difficult doing work/chores Not difficult at all     Interpretation of Total Score  Total Score Depression Severity:  1-4 = Minimal depression, 5-9 = Mild depression, 10-14 = Moderate depression, 15-19 = Moderately severe depression, 20-27 = Severe depression   Psychosocial Evaluation and Intervention:   Psychosocial Re-Evaluation:  Psychosocial Re-Evaluation    [place] Name 08/18/20 0920 09/12/20 0953           Psychosocial Re-Evaluation   Current issues with None Identified None Identified      Comments Patient's initial QOL score was 15.18% overall and his PHQ-9 score was 7. He has been on cymbalta 30 mg BID but reproted that he was not taking during pharmacy review at his orientation visit. He is new to the program  completing 2 sessions. Will continue to moniotr. Patient continues to have no psychosocial issues identified. He has completed 10 sessions with consistent attendance. He demonstrates a very positive attitude and works hard during the  sessions. Will continue to monitor.      Expected Outcomes Patient will have no psychosocial issues identified at discharge. Patient will have no psychosocial issues identified at discharge.      Interventions Encouraged to attend Pulmonary Rehabilitation for the exercise;Relaxation education;Stress management education Encouraged to attend Pulmonary Rehabilitation for the exercise;Relaxation education;Stress management education      Continue Psychosocial Services  No Follow up required No Follow up required             Psychosocial Discharge (Final Psychosocial Re-Evaluation):  Psychosocial Re-Evaluation - 09/12/20 0953      Psychosocial Re-Evaluation   Current issues with None Identified    Comments Patient continues to have no psychosocial issues identified. He has completed 10 sessions with consistent attendance. He demonstrates a very positive attitude and works hard during the sessions. Will continue to monitor.    Expected Outcomes Patient will have no psychosocial issues identified at discharge.    Interventions Encouraged to attend Pulmonary Rehabilitation for the exercise;Relaxation education;Stress management education    Continue Psychosocial Services  No Follow up required            Education: Education Goals: Education classes will be provided on a weekly basis, covering required topics. Participant will state understanding/return demonstration of topics presented.  Learning Barriers/Preferences:  Learning Barriers/Preferences - 08/07/20 1246      Learning Barriers/Preferences   Learning Barriers None    Learning Preferences Skilled Demonstration;Individual Instruction           Education Topics: How Lungs Work and Diseases: - Discuss the anatomy of the lungs and diseases that can affect the lungs, such as COPD.   Exercise: -Discuss the importance of exercise, FITT principles of exercise, normal and abnormal responses to exercise, and how to exercise  safely.   Environmental Irritants: -Discuss types of environmental irritants and how to limit exposure to environmental irritants.   Meds/Inhalers and oxygen: - Discuss respiratory medications, definition of an inhaler and oxygen, and the proper way to use an inhaler and oxygen.   Energy Saving Techniques: - Discuss methods to conserve energy and decrease shortness of breath when performing activities of daily living.    Bronchial Hygiene / Breathing Techniques: - Discuss breathing mechanics, pursed-lip breathing technique,  proper posture, effective ways to clear airways, and other functional breathing techniques   Cleaning Equipment: - Provides group verbal and written instruction about the health risks of elevated stress, cause of high stress, and healthy ways to reduce stress.   Nutrition I: Fats: - Discuss the types of cholesterol, what cholesterol does to the body, and how cholesterol levels can be controlled.   Nutrition II: Labels: -Discuss the different components of food labels and how to read food labels.   Respiratory Infections: - Discuss the signs and symptoms of respiratory infections, ways to prevent respiratory infections, and the importance of seeking medical treatment when having a respiratory infection.   Stress I: Signs and Symptoms: - Discuss the causes of stress, how stress may lead to anxiety and depression, and ways to limit stress. Flowsheet Row PULMONARY REHAB OTHER RESPIRATORY from 08/28/2020 in [place]  Date 08/14/20  Educator [facility]  Instruction Review Code 1- Verbalizes Understanding      Stress II: Relaxation: -Discuss relaxation techniques to  limit stress. Flowsheet Row PULMONARY REHAB OTHER RESPIRATORY from 08/28/2020 in [place]  Date 08/21/20  Educator [facility]  Instruction Review Code 2- Demonstrated Understanding      Oxygen for Home/Travel: - Discuss how to prepare for travel when on  oxygen and proper ways to transport and store oxygen to ensure safety. Flowsheet Row PULMONARY REHAB OTHER RESPIRATORY from 08/28/2020 in [place]  Date 08/28/20  Educator DJ  Instruction Review Code 1- Verbalizes Understanding      Knowledge Questionnaire Score:  Knowledge Questionnaire Score - 08/07/20 1258      Knowledge Questionnaire Score   Pre Score 5/18           Core Components/Risk Factors/Patient Goals at Admission:  Personal Goals and Risk Factors at Admission - 08/07/20 1247      Core Components/Risk Factors/Patient Goals on Admission    Weight Management Yes;Obesity    Intervention Weight Management: Develop a combined nutrition and exercise program designed to reach desired caloric intake, while maintaining appropriate intake of nutrient and fiber, sodium and fats, and appropriate energy expenditure required for the weight goal.;Weight Management: Provide education and appropriate resources to help participant work on and attain dietary goals.;Weight Management/Obesity: Establish reasonable short term and long term weight goals.;Obesity: Provide education and appropriate resources to help participant work on and attain dietary goals.    Admit Weight 294 lb (133.4 kg)    Goal Weight: Short Term 274 lb (124.3 kg)    Goal Weight: Long Term 250 lb (113.4 kg)    Expected Outcomes Short Term: Continue to assess and modify interventions until short term weight is achieved;Long Term: Adherence to nutrition and physical activity/exercise program aimed toward attainment of established weight goal;Weight Maintenance: Understanding of the daily nutrition guidelines, which includes 25-35% calories from fat, 7% or less cal from saturated fats, less than 200mg  cholesterol, less than 1.5gm of sodium, & 5 or more servings of fruits and vegetables daily;Weight Loss: Understanding of general recommendations for a balanced deficit meal plan, which promotes 1-2 lb weight  loss per week and includes a negative energy balance of [phone] kcal/d;Understanding recommendations for meals to include 15-35% energy as protein, 25-35% energy from fat, 35-60% energy from carbohydrates, less than 200mg  of dietary cholesterol, 20-35 gm of total fiber daily;Understanding of distribution of calorie intake throughout the day with the consumption of 4-5 meals/snacks;Weight Gain: Understanding of general recommendations for a high calorie, high protein meal plan that promotes weight gain by distributing calorie intake throughout the day with the consumption for 4-5 meals, snacks, and/or supplements    Improve shortness of breath with ADL's Yes    Intervention Provide education, individualized exercise plan and daily activity instruction to help decrease symptoms of SOB with activities of daily living.    Expected Outcomes Short Term: Improve cardiorespiratory fitness to achieve a reduction of symptoms when performing ADLs;Long Term: Be able to perform more ADLs without symptoms or delay the onset of symptoms           Core Components/Risk Factors/Patient Goals Review:   Goals and Risk Factor Review    Row Name 08/18/20 5732 09/12/20 0954           Core Components/Risk Factors/Patient Goals Review   Personal Goals Review Improve shortness of breath with ADL's;Weight Management/Obesity Improve shortness of breath with ADL's;Weight Management/Obesity      Review Patient is new to the program completing 2 sessions. He was referred to pulmonary rehab for increased SOB  secondary to ILD. His personal goals are to return to his ADL's and increase his energy and strength. Will continue to monitor for progress. Patient has completed 10 sessions maintaining his weight since his initial visit. He is doing very well with progression. He saw his pulmonologist 09/19/20 for a routine follow up. He changed his MDI from [place] to [place]. His personal goals are to return to his ADL's;  increase his strength and energy. We will continue to monitor as patient works toward meeting these goals.      Expected Outcomes Patient will complete the program meeting both personal and program goals. Patient will complete the program meeting both personal and program goals.             Core Components/Risk Factors/Patient Goals at Discharge (Final Review):   Goals and Risk Factor Review - 09/12/20 0954      Core Components/Risk Factors/Patient Goals Review   Personal Goals Review Improve shortness of breath with ADL's;Weight Management/Obesity    Review Patient has completed 10 sessions maintaining his weight since his initial visit. He is doing very well with progression. He saw his pulmonologist 09/19/20 for a routine follow up. He changed his MDI from [place] to [place]. His personal goals are to return to his ADL's; increase his strength and energy. We will continue to monitor as patient works toward meeting these goals.    Expected Outcomes Patient will complete the program meeting both personal and program goals.           ITP Comments:   Comments: ITP REVIEW Pt is making expected progress toward pulmonary rehab goals after completing 10 sessions. Recommend continued exercise, life style modification, education, and utilization of breathing techniques to increase stamina and strength and decrease shortness of breath with exertion.

## 2020-09-18 NOTE — Progress Notes (Signed)
Daily Session Note  Patient Details  Name: Mathew Lara MRN: [id number] Date of Birth: 07/09/1961 Referring Provider:   Flowsheet Row PULMONARY REHAB OTHER RESP ORIENTATION from 08/07/2020 in [place]  Referring Provider Dr. Vaughan Browner       Encounter Date: 09/18/2020  Check In:  Session Check In - 09/18/20 1330      Check-In   Supervising physician immediately available to respond to emergencies [facility] MD immediately available    Physician(s) Dr. Harl Bowie    Location [facility]    Staff Present Geanie Cooley, RN;Madison Audria Nine, MS, Exercise Physiologist;Bunny Lowdermilk Kris Mouton, MS, ACSM-CEP, Exercise Physiologist    Virtual Visit No    Medication changes reported     No    Fall or balance concerns reported    No    Tobacco Cessation No Change    Warm-up and Cool-down Performed as group-led instruction    Resistance Training Performed Yes    VAD Patient? No    PAD/SET Patient? No      Pain Assessment   Currently in Pain? No/denies    Pain Score 0-No pain    Multiple Pain Sites No           Capillary Blood Glucose: No results found for this or any previous visit (from the past 24 hour(s)).    Social History   Tobacco Use  Smoking Status Former Smoker  . Packs/day: 1.00  . Years: 42.00  . Pack years: 42.00  . Types: Cigarettes  . Start date: 09/07/1971  . Quit date: 05/28/2014  . Years since quitting: 6.3  Smokeless Tobacco Never Used  Tobacco Comment   quit in November 2015 after smoking x 20 yrs.    Goals Met:  Independence with exercise equipment Exercise tolerated well No report of cardiac concerns or symptoms Strength training completed today  Goals Unmet:  Not Applicable  Comments: checkout time is 1430   Dr. Kathie Dike is Medical Director for [facility] Pulmonary Rehab.

## 2020-09-23 ENCOUNTER — Encounter (HOSPITAL_COMMUNITY): Payer: 59

## 2020-09-25 ENCOUNTER — Encounter (HOSPITAL_COMMUNITY)
Admission: RE | Admit: 2020-09-25 | Discharge: 2020-09-25 | Disposition: A | Discharge status: Home / Self Care | Payer: 59 | Source: Ambulatory Visit | Attending: Pulmonary Disease | Admitting: Pulmonary Disease

## 2020-09-25 ENCOUNTER — Other Ambulatory Visit: Payer: Self-pay

## 2020-09-25 DIAGNOSIS — J849 Interstitial pulmonary disease, unspecified: Secondary | ICD-10-CM

## 2020-09-25 NOTE — Progress Notes (Addendum)
Daily Session Note  Patient Details  Name: KEYON LILLER MRN: [id number] Date of Birth: [birth year] Referring Provider:   Flowsheet Row PULMONARY REHAB OTHER RESP ORIENTATION from 08/07/2020 in [place]  Referring Provider Dr. Vaughan Browner       Encounter Date: 09/25/2020  Check In:  Session Check In - 09/25/20 1330      Check-In   Supervising physician immediately available to respond to emergencies [facility] MD immediately available    Physician(s) Dr. Domenic Polite    Location [facility]    Staff Present Geanie Cooley, RN;Traves Majchrzak Wynetta Emery, RN, BSN;Madison Audria Nine, MS, Exercise Physiologist    Virtual Visit No    Medication changes reported     No    Fall or balance concerns reported    No    Tobacco Cessation No Change    Warm-up and Cool-down Performed as group-led instruction    Resistance Training Performed Yes    VAD Patient? No    PAD/SET Patient? No      Pain Assessment   Currently in Pain? Yes    Pain Score 6     Pain Location Back    Pain Orientation Lower    Pain Descriptors / Indicators Aching;Constant    Pain Type Chronic pain    Pain Onset More than a month ago    Pain Frequency Constant    Aggravating Factors  Weather    Pain Relieving Factors Oxycodone    Effect of Pain on Daily Activities Limits activity at times    Multiple Pain Sites No           Capillary Blood Glucose: No results found for this or any previous visit (from the past 24 hour(s)).    Social History   Tobacco Use  Smoking Status Former Smoker  . Packs/day: 1.00  . Years: 42.00  . Pack years: 42.00  . Types: Cigarettes  . Start date: 09/07/1971  . Quit date: 05/28/2014  . Years since quitting: 6.3  Smokeless Tobacco Never Used  Tobacco Comment   quit in November 2015 after smoking x 20 yrs.    Goals Met:  Proper associated with RPD/PD & O2 Sat Independence with exercise equipment Improved SOB with ADL's Using PLB without cueing &  demonstrates good technique Exercise tolerated well No report of cardiac concerns or symptoms Strength training completed today  Goals Unmet:  Not Applicable  Comments: Check out 1430.   Dr. Kathie Dike is Medical Director for [facility] Pulmonary Rehab.

## 2020-09-29 IMAGING — DX DG CHEST 1V PORT
1 series · 1 of 1 positions shown · non-contrast
Comparison: 09/18/2019

CLINICAL DATA: Shortness of breath. Diagnosed with [id number]
infection last month. History of COPD.

EXAM:
PORTABLE CHEST 1 VIEW

[chest ap]
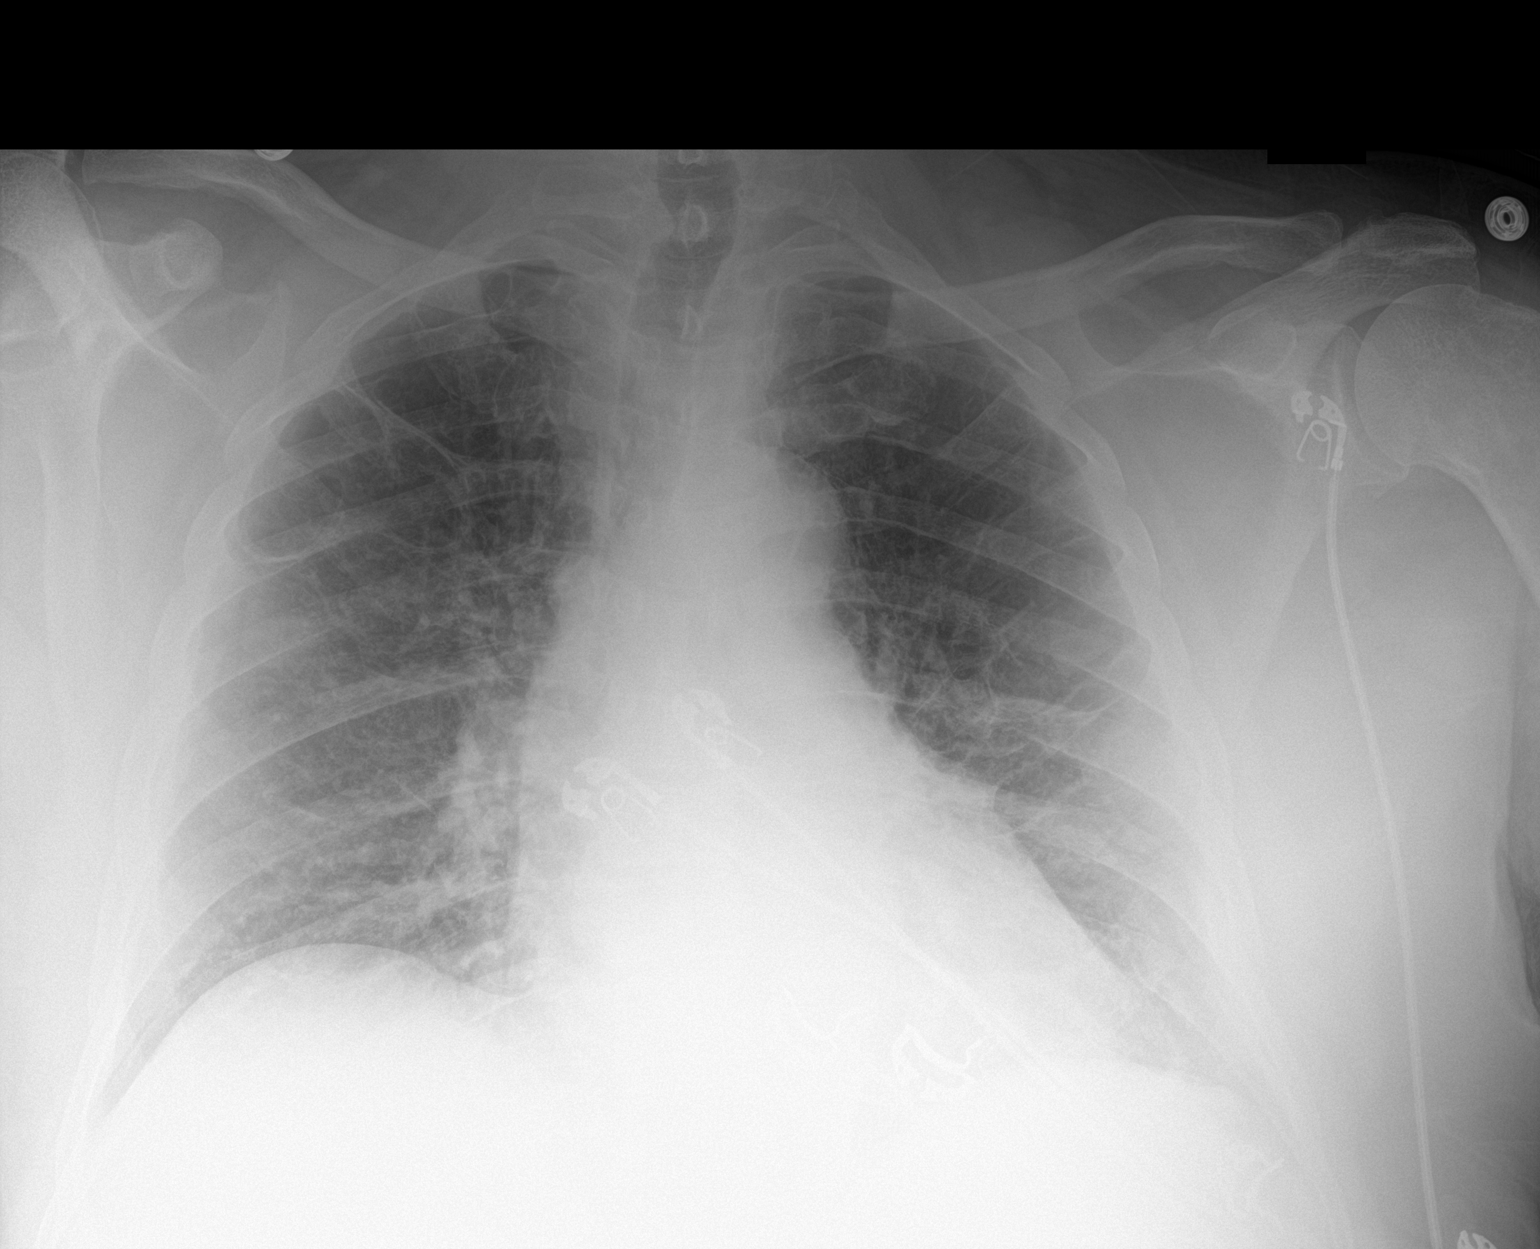

[1 of 1 positions shown; findings below may reference images not displayed]

FINDINGS: The cardiomediastinal silhouette is unchanged. Lung volumes are
unchanged with persistent mild elevation of the right hemidiaphragm.
There is underlying chronic lung disease with suspected bullous
disease in the right upper lobe and scarring in the left midlung.
The interstitial markings are stable to slightly more prominent
diffusely compared to the prior study. No new airspace
consolidation, sizeable pleural effusion, pneumothorax is
identified. No acute osseous abnormality is seen.
IMPRESSION: Chronic lung disease with stable to slightly increased interstitial
prominence. Residual/recurrent atypical infection is not excluded.
No acute airspace consolidation.

## 2020-09-30 ENCOUNTER — Encounter (HOSPITAL_COMMUNITY)
Admission: RE | Admit: 2020-09-30 | Discharge: 2020-09-30 | Disposition: A | Discharge status: Home / Self Care | Payer: Medicaid Other | Source: Ambulatory Visit | Attending: Pulmonary Disease | Admitting: Pulmonary Disease

## 2020-09-30 ENCOUNTER — Other Ambulatory Visit: Payer: Self-pay

## 2020-09-30 VITALS — Wt 295.4 lb

## 2020-09-30 DIAGNOSIS — J849 Interstitial pulmonary disease, unspecified: Secondary | ICD-10-CM

## 2020-09-30 NOTE — Progress Notes (Signed)
Daily Session Note  Patient Details  Name: Mathew Lara MRN: [id number] Date of Birth: [birth year] Referring Provider:   Flowsheet Row PULMONARY REHAB OTHER RESP ORIENTATION from 08/07/2020 in [place]  Referring Provider Dr. Vaughan Browner       Encounter Date: 09/30/2020  Check In:  Session Check In - 09/30/20 1330      Check-In   Supervising physician immediately available to respond to emergencies [facility] MD immediately available    Physician(s) Branch    Location [facility]    Staff Present Geanie Cooley, RN;Madison Audria Nine, MS, Exercise Physiologist    Virtual Visit No    Medication changes reported     No    Fall or balance concerns reported    No    Tobacco Cessation No Change    Warm-up and Cool-down Performed as group-led instruction    Resistance Training Performed Yes    VAD Patient? No    PAD/SET Patient? No      Pain Assessment   Currently in Pain? No/denies    Pain Score 6     Pain Location Back    Pain Orientation Lower    Pain Descriptors / Indicators Aching;Constant    Pain Type Chronic pain    Pain Onset More than a month ago    Pain Frequency Constant    Effect of Pain on Daily Activities more pain when standing    Multiple Pain Sites No           Capillary Blood Glucose: No results found for this or any previous visit (from the past 24 hour(s)).    Social History   Tobacco Use  Smoking Status Former Smoker  . Packs/day: 1.00  . Years: 42.00  . Pack years: 42.00  . Types: Cigarettes  . Start date: 09/07/1971  . Quit date: 05/28/2014  . Years since quitting: 6.3  Smokeless Tobacco Never Used  Tobacco Comment   quit in November 2015 after smoking x 20 yrs.    Goals Met:  Independence with exercise equipment Using PLB without cueing & demonstrates good technique Exercise tolerated well No report of cardiac concerns or symptoms Strength training completed today  Goals Unmet: not  applicable   Comments: check out @ 2:30   Dr. Kathie Dike is Medical Director for [facility] Pulmonary Rehab.

## 2020-10-02 ENCOUNTER — Encounter (HOSPITAL_COMMUNITY)
Admission: RE | Admit: 2020-10-02 | Discharge: 2020-10-02 | Disposition: A | Discharge status: Home / Self Care | Payer: 59 | Source: Ambulatory Visit | Attending: Pulmonary Disease | Admitting: Pulmonary Disease

## 2020-10-02 ENCOUNTER — Other Ambulatory Visit: Payer: Self-pay

## 2020-10-02 DIAGNOSIS — J849 Interstitial pulmonary disease, unspecified: Secondary | ICD-10-CM

## 2020-10-02 NOTE — Progress Notes (Signed)
I have reviewed a Home Exercise Prescription with Mathew Lara . Mathew Lara is currently exercising at home.  The patient was advised to walk 2-3 days a week for 30 minutes outside of rehab.  Mathew Lara and I discussed how to progress their exercise prescription.  The patient stated that their goals were to lose weight and to return to normal ADL's.  The patient stated that they understand the exercise prescription.  We reviewed exercise guidelines, target heart rate during exercise, RPE Scale, weather conditions, NTG use, endpoints for exercise, warmup and cool down.  Patient is encouraged to come to me with any questions. I will continue to follow up with the patient to assist them with progression and safety.

## 2020-10-02 NOTE — Progress Notes (Signed)
Daily Session Note  Patient Details  Name: Mathew Lara MRN: [id number] Date of Birth: Jul 30, 1961 Referring Provider:   Flowsheet Row PULMONARY REHAB OTHER RESP ORIENTATION from 08/07/2020 in [place]  Referring Provider Dr. Vaughan Browner       Encounter Date: 10/02/2020  Check In:  Session Check In - 10/02/20 1330      Check-In   Supervising physician immediately available to respond to emergencies [facility] MD immediately available    Physician(s) Branch    Location [facility]    Staff Present Geanie Cooley, RN;Madison Audria Nine, MS, Exercise Physiologist;Yeison Sippel Wynetta Emery, RN, BSN    Virtual Visit No    Medication changes reported     No    Fall or balance concerns reported    No    Tobacco Cessation No Change    Warm-up and Cool-down Performed as group-led instruction    Resistance Training Performed Yes    VAD Patient? No    PAD/SET Patient? No      Pain Assessment   Currently in Pain? Yes    Pain Score 5     Pain Location Back    Pain Orientation Lower    Pain Descriptors / Indicators Aching    Pain Type Chronic pain    Pain Onset More than a month ago    Pain Frequency Constant    Aggravating Factors  Weather    Pain Relieving Factors Oxycodone    Effect of Pain on Daily Activities Affects mobility at times.    Multiple Pain Sites No           Capillary Blood Glucose: No results found for this or any previous visit (from the past 24 hour(s)).    Social History   Tobacco Use  Smoking Status Former Smoker  . Packs/day: 1.00  . Years: 42.00  . Pack years: 42.00  . Types: Cigarettes  . Start date: 09/07/1971  . Quit date: 05/28/2014  . Years since quitting: 6.3  Smokeless Tobacco Never Used  Tobacco Comment   quit in November 2015 after smoking x 20 yrs.    Goals Met:  Proper associated with RPD/PD & O2 Sat Independence with exercise equipment Improved SOB with ADL's Using PLB without cueing & demonstrates good  technique Exercise tolerated well No report of cardiac concerns or symptoms Strength training completed today  Goals Unmet:  Not Applicable  Comments: Check out 1430.   Dr. Kathie Dike is Medical Director for [facility] Pulmonary Rehab.

## 2020-10-07 ENCOUNTER — Other Ambulatory Visit: Payer: Self-pay

## 2020-10-07 ENCOUNTER — Encounter (HOSPITAL_COMMUNITY)
Admission: RE | Admit: 2020-10-07 | Discharge: 2020-10-07 | Disposition: A | Discharge status: Home / Self Care | Payer: Self-pay | Source: Ambulatory Visit | Attending: Pulmonary Disease | Admitting: Pulmonary Disease

## 2020-10-07 DIAGNOSIS — J849 Interstitial pulmonary disease, unspecified: Secondary | ICD-10-CM

## 2020-10-07 NOTE — Progress Notes (Signed)
Daily Session Note  Patient Details  Name: Mathew Lara MRN: [id number] Date of Birth: [birth year] Referring Provider:   Flowsheet Row PULMONARY REHAB OTHER RESP ORIENTATION from 08/07/2020 in [place]  Referring Provider Dr. Vaughan Browner       Encounter Date: 10/07/2020  Check In:  Session Check In - 10/07/20 1330      Check-In   Supervising physician immediately available to respond to emergencies [facility] MD immediately available    Physician(s) Dr. Domenic Polite    Location [facility]    Staff Present Cathren Harsh, MS, Exercise Physiologist;Phyllis Billingsley, RN    Virtual Visit No    Medication changes reported     No    Fall or balance concerns reported    No    Tobacco Cessation No Change    Warm-up and Cool-down Performed as group-led instruction    Resistance Training Performed Yes    VAD Patient? No    PAD/SET Patient? No      Pain Assessment   Currently in Pain? Yes    Pain Score 5     Pain Location Back    Pain Orientation Lower           Capillary Blood Glucose: No results found for this or any previous visit (from the past 24 hour(s)).    Social History   Tobacco Use  Smoking Status Former Smoker  . Packs/day: 1.00  . Years: 42.00  . Pack years: 42.00  . Types: Cigarettes  . Start date: 09/07/1971  . Quit date: 05/28/2014  . Years since quitting: 6.3  Smokeless Tobacco Never Used  Tobacco Comment   quit in November 2015 after smoking x 20 yrs.    Goals Met:  Independence with exercise equipment Exercise tolerated well No report of cardiac concerns or symptoms Strength training completed today  Goals Unmet:  Not Applicable  Comments: check out 1430   Dr. Kathie Dike is Medical Director for [facility].

## 2020-10-09 ENCOUNTER — Other Ambulatory Visit: Payer: Self-pay

## 2020-10-09 ENCOUNTER — Encounter (HOSPITAL_COMMUNITY)
Admission: RE | Admit: 2020-10-09 | Discharge: 2020-10-09 | Disposition: A | Discharge status: Home / Self Care | Payer: Medicaid Other | Source: Ambulatory Visit | Attending: Pulmonary Disease | Admitting: Pulmonary Disease

## 2020-10-09 DIAGNOSIS — J849 Interstitial pulmonary disease, unspecified: Secondary | ICD-10-CM

## 2020-10-09 NOTE — Progress Notes (Signed)
Daily Session Note  Patient Details  Name: Mathew Lara MRN: [id number] Date of Birth: 06-09-1961 Referring Provider:   Flowsheet Row PULMONARY REHAB OTHER RESP ORIENTATION from 08/07/2020 in [place]  Referring Provider Dr. Vaughan Browner       Encounter Date: 10/09/2020  Check In:  Session Check In - 10/09/20 1330      Check-In   Supervising physician immediately available to respond to emergencies [facility] immediately available    Physician(s) Dr. Domenic Polite    Location [facility]    Staff Present Geanie Cooley, RN;Debra Wynetta Emery, RN, BSN;Madison Audria Nine, MS, Exercise Physiologist;Dalton Kris Mouton, MS, ACSM-CEP, Exercise Physiologist    Virtual Visit No    Medication changes reported     No    Fall or balance concerns reported    No    Tobacco Cessation No Change    Warm-up and Cool-down Performed as group-led instruction    Resistance Training Performed Yes    VAD Patient? No    PAD/SET Patient? No      Pain Assessment   Currently in Pain? No/denies    Pain Score 5     Pain Location Back    Pain Orientation Lower    Pain Descriptors / Indicators Aching    Pain Type Chronic pain    Pain Onset More than a month ago    Pain Frequency Constant           Capillary Blood Glucose: No results found for this or any previous visit (from the past 24 hour(s)).    Social History   Tobacco Use  Smoking Status Former Smoker  . Packs/day: 1.00  . Years: 42.00  . Pack years: 42.00  . Types: Cigarettes  . Start date: 09/07/1971  . Quit date: 05/28/2014  . Years since quitting: 6.3  Smokeless Tobacco Never Used  Tobacco Comment   quit in November 2015 after smoking x 20 yrs.    Goals Met:  Proper associated with RPD/PD & O2 Sat Independence with exercise equipment Improved SOB with ADL's Exercise tolerated well No report of cardiac concerns or symptoms Strength training completed today   Goals Unmet:  goals  Comments: check  out at 2:30   Dr. Kathie Dike is Medical Director for [facility].

## 2020-10-13 IMAGING — CR DG CHEST 1V PORT
1 series · 1 of 1 positions shown · non-contrast
Comparison: 09/28/2019

CLINICAL DATA: Short of breath, hypoxia

EXAM:
PORTABLE CHEST 1 VIEW

[portable]
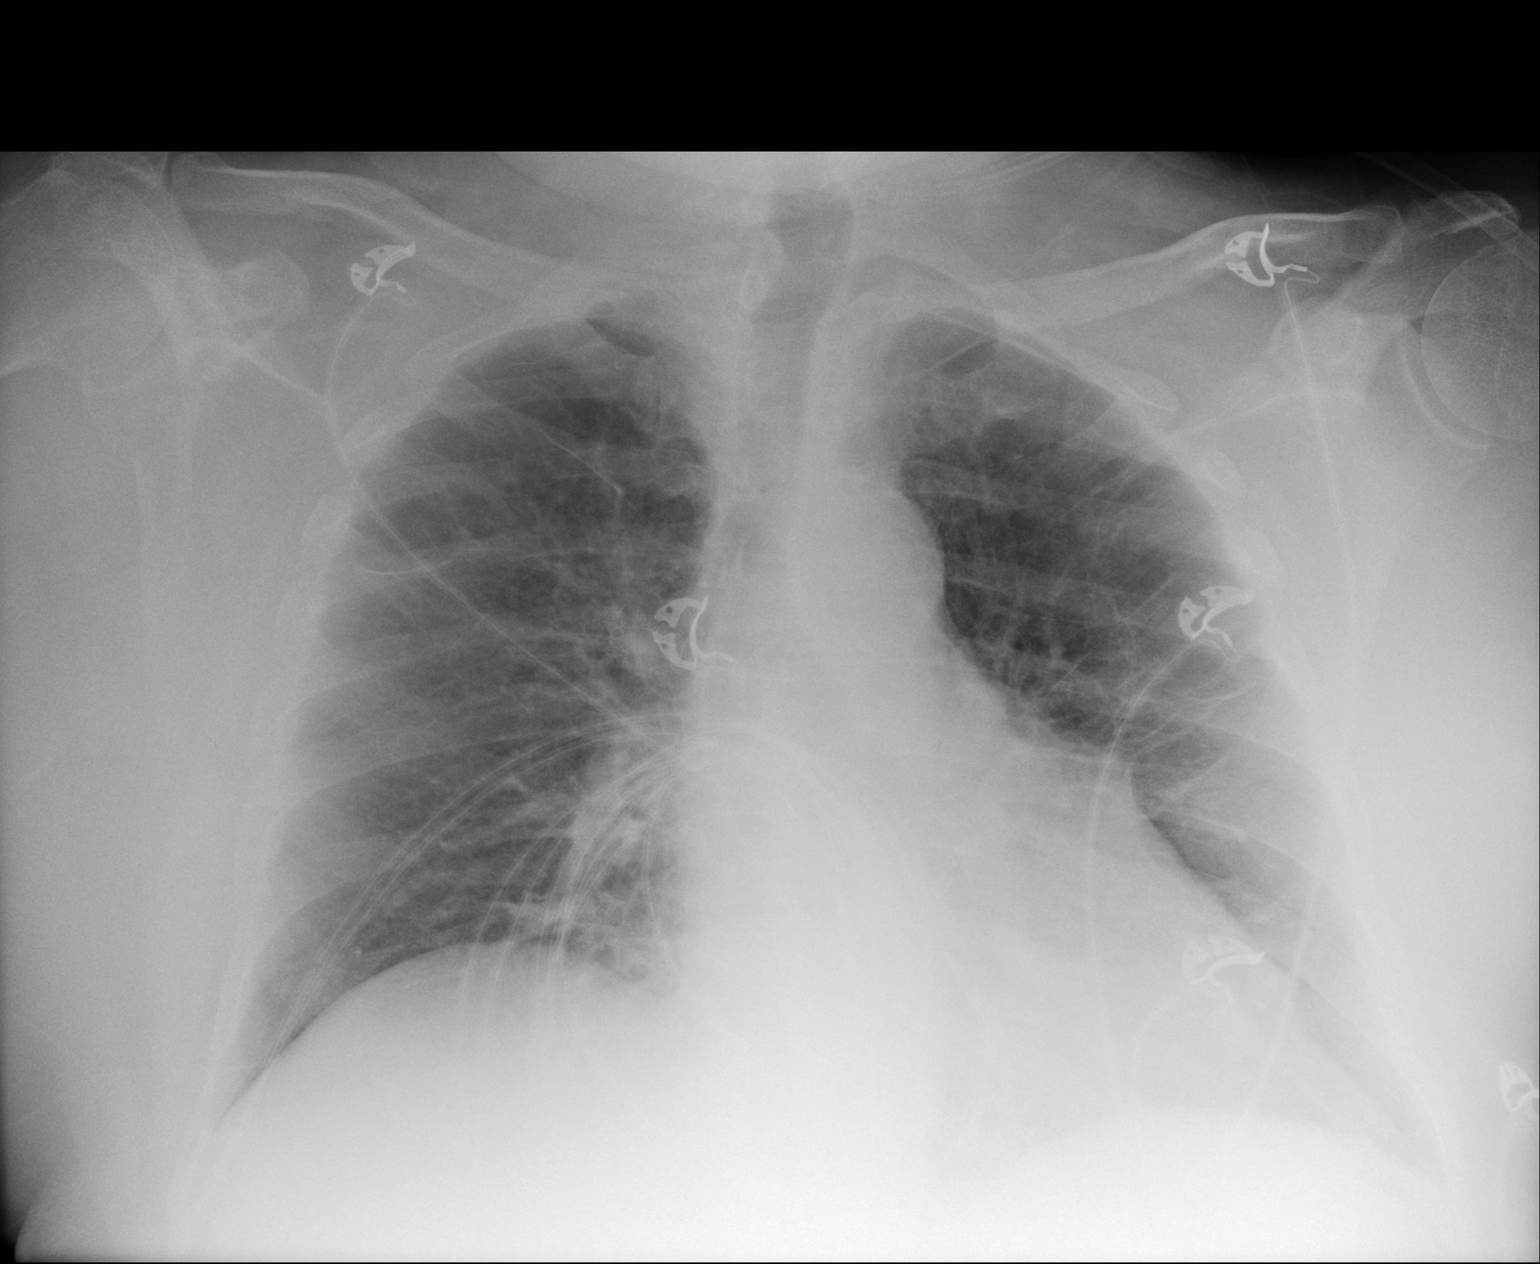

[1 of 1 positions shown; findings below may reference images not displayed]

FINDINGS: Single frontal view of the chest demonstrates a stable cardiac
silhouette. Bullous emphysema again noted. No airspace disease,
effusion, or pneumothorax. No acute bony abnormalities.
IMPRESSION: 1. Stable bullous emphysema.  No acute airspace disease.

## 2020-10-14 ENCOUNTER — Other Ambulatory Visit: Payer: Self-pay

## 2020-10-14 ENCOUNTER — Encounter (HOSPITAL_COMMUNITY)
Admission: RE | Admit: 2020-10-14 | Discharge: 2020-10-14 | Disposition: A | Discharge status: Home / Self Care | Payer: 59 | Source: Ambulatory Visit | Attending: Pulmonary Disease | Admitting: Pulmonary Disease

## 2020-10-14 VITALS — Wt 296.3 lb

## 2020-10-14 DIAGNOSIS — J849 Interstitial pulmonary disease, unspecified: Secondary | ICD-10-CM

## 2020-10-14 NOTE — Progress Notes (Signed)
Daily Session Note  Patient Details  Name: Mathew Lara MRN: [id number] Date of Birth: 18-Feb-1961 Referring Provider:   Flowsheet Row PULMONARY REHAB OTHER RESP ORIENTATION from 08/07/2020 in [place]  Referring Provider Dr. Vaughan Browner       Encounter Date: 10/14/2020  Check In:  Session Check In - 10/14/20 1330      Check-In   Supervising physician immediately available to respond to emergencies [facility] immediately available    Physician(s) Dr. Harl Bowie    Location [facility]    Staff Present Geanie Cooley, RN;Madison Audria Nine, MS, Exercise Physiologist;Dalton Kris Mouton, MS, ACSM-CEP, Exercise Physiologist    Virtual Visit No    Medication changes reported     No    Fall or balance concerns reported    No    Tobacco Cessation No Change    Warm-up and Cool-down Performed as group-led instruction    Resistance Training Performed Yes    VAD Patient? No    PAD/SET Patient? No      Pain Assessment   Currently in Pain? Yes    Pain Score 5     Pain Location Back    Pain Orientation Lower    Pain Descriptors / Indicators Aching    Pain Type Chronic pain    Pain Onset More than a month ago    Pain Frequency Constant    Multiple Pain Sites No           Capillary Blood Glucose: No results found for this or any previous visit (from the past 24 hour(s)).    Social History   Tobacco Use  Smoking Status Former Smoker  . Packs/day: 1.00  . Years: 42.00  . Pack years: 42.00  . Types: Cigarettes  . Start date: 09/07/1971  . Quit date: 05/28/2014  . Years since quitting: 6.3  Smokeless Tobacco Never Used  Tobacco Comment   quit in November 2015 after smoking x 20 yrs.    Goals Met:  Proper associated with RPD/PD & O2 Sat Independence with exercise equipment Improved SOB with ADL's Exercise tolerated well No report of cardiac concerns or symptoms Strength training completed today  Goals Unmet:  Not Applicable  Comments:  check out @ 2:30 pm   Dr. Kathie Dike is Medical Director for [facility].

## 2020-10-16 ENCOUNTER — Encounter (HOSPITAL_COMMUNITY)
Admission: RE | Admit: 2020-10-16 | Discharge: 2020-10-16 | Disposition: A | Discharge status: Home / Self Care | Payer: Self-pay | Source: Ambulatory Visit | Attending: Pulmonary Disease | Admitting: Pulmonary Disease

## 2020-10-16 ENCOUNTER — Other Ambulatory Visit: Payer: Self-pay

## 2020-10-16 DIAGNOSIS — J849 Interstitial pulmonary disease, unspecified: Secondary | ICD-10-CM

## 2020-10-16 NOTE — Progress Notes (Signed)
Daily Session Note  Patient Details  Name: Mathew Lara MRN: [id number] Date of Birth: 09-28-1960 Referring Provider:   Flowsheet Row PULMONARY REHAB OTHER RESP ORIENTATION from 08/07/2020 in [place]  Referring Provider Dr. Vaughan Browner       Encounter Date: 10/16/2020  Check In:  Session Check In - 10/16/20 1330      Check-In   Supervising physician immediately available to respond to emergencies [facility] MD immediately available    Physician(s) Dr. Harl Bowie    Location [facility]    Staff Present Cathren Harsh, MS, Exercise Physiologist;Iasiah Ozment Wynetta Emery, RN, BSN;Phyllis Billingsley, RN    Virtual Visit No    Medication changes reported     No    Fall or balance concerns reported    No    Tobacco Cessation No Change    Warm-up and Cool-down Performed as group-led instruction    Resistance Training Performed Yes    VAD Patient? No    PAD/SET Patient? No      Pain Assessment   Currently in Pain? Yes    Pain Score 5     Pain Location Back    Pain Orientation Lower    Pain Descriptors / Indicators Aching;Constant    Pain Type Chronic pain    Pain Onset More than a month ago    Pain Frequency Constant    Aggravating Factors  Weather    Pain Relieving Factors Oxycodone    Effect of Pain on Daily Activities Affects mobility at times.    Multiple Pain Sites No           Capillary Blood Glucose: No results found for this or any previous visit (from the past 24 hour(s)).    Social History   Tobacco Use  Smoking Status Former Smoker  . Packs/day: 1.00  . Years: 42.00  . Pack years: 42.00  . Types: Cigarettes  . Start date: 09/07/1971  . Quit date: 05/28/2014  . Years since quitting: 6.3  Smokeless Tobacco Never Used  Tobacco Comment   quit in November 2015 after smoking x 20 yrs.    Goals Met:  Proper associated with RPD/PD & O2 Sat Independence with exercise equipment Improved SOB with ADL's Using PLB without cueing &  demonstrates good technique Exercise tolerated well No report of cardiac concerns or symptoms Strength training completed today  Goals Unmet:  Not Applicable  Comments: Check out 1430.   Dr. Kathie Dike is Medical Director for [facility].

## 2020-10-16 NOTE — Progress Notes (Signed)
Pulmonary Individual Treatment Plan  Patient Details  Name: Mathew Lara MRN: [id number] Date of Birth: 10-Mar-1961 Referring Provider:   [place] from 08/07/2020 in [place]  Referring Provider Dr. Vaughan Browner       Initial Encounter Date:  Flowsheet Row PULMONARY REHAB OTHER RESP ORIENTATION from 08/07/2020 in [place] [facility]  Date 08/07/20      Visit Diagnosis: Interstitial lung disease ([place])  Patient's Home Medications on Admission:   Current Outpatient Medications:  .  acetaminophen (TYLENOL) 325 MG tablet, Take 2 tablets (650 mg total) by mouth every 6 (six) hours as needed for mild pain or headache (fever >/= 101)., Disp: 12 tablet, Rfl: 0 .  amLODipine (NORVASC) 10 MG tablet, Take 10 mg by mouth daily., Disp: , Rfl:  .  ascorbic acid (VITAMIN C) 500 MG tablet, Take 1 tablet (500 mg total) by mouth daily., Disp: 30 tablet, Rfl: 1 .  aspirin EC 81 MG tablet, Take 81 mg by mouth daily., Disp: , Rfl:  .  DULoxetine (CYMBALTA) 30 MG capsule, Take 60 mg by mouth daily., Disp: , Rfl:  .  Fluticasone-Salmeterol (ADVAIR) 250-50 MCG/DOSE AEPB, Inhale 1 puff into the lungs 2 (two) times daily., Disp: 60 each, Rfl: 5 .  HYDROcodone-acetaminophen (NORCO/VICODIN) 5-325 MG tablet, Take 1 tablet by mouth every 6 (six) hours as needed for moderate pain., Disp: , Rfl:  .  hydrOXYzine (ATARAX/VISTARIL) 25 MG tablet, Take 25 mg by mouth 3 (three) times daily as needed for anxiety., Disp: , Rfl:  .  loratadine (CLARITIN) 10 MG tablet, Take 1 tablet (10 mg total) by mouth daily., Disp: 30 tablet, Rfl: 1 .  losartan (COZAAR) 25 MG tablet, Take 25 mg by mouth daily. , Disp: , Rfl:  .  oxyCODONE (OXY IR/ROXICODONE) 5 MG immediate release tablet, Take 5 mg by mouth every 8 (eight) hours as needed., Disp: , Rfl:  .  PROAIR HFA 108 (90 Base) MCG/ACT inhaler, Inhale 2 puffs into the lungs every 4 (four) hours as needed for  wheezing or shortness of breath., Disp: 18 g, Rfl: 3 .  sodium bicarbonate 650 MG tablet, Take 650 mg by mouth 3 (three) times daily., Disp: , Rfl:  .  zinc sulfate 220 (50 Zn) MG capsule, Take 1 capsule (220 mg total) by mouth daily., Disp: 30 capsule, Rfl: 1  Past Medical History: Past Medical History:  Diagnosis Date  . Alcohol abuse   . Anxiety   . Bronchitis   . Chronic hip pain   . CKD (chronic kidney disease) stage 3, GFR 30-59 ml/min (HCC)   . COPD (chronic obstructive pulmonary disease) ([place])   . Essential hypertension   . Hepatitis C   . Mixed hyperlipidemia   . Sleep apnea     Tobacco Use: Social History   Tobacco Use  Smoking Status Former Smoker  . Packs/day: 1.00  . Years: 42.00  . Pack years: 42.00  . Types: Cigarettes  . Start date: 09/07/1971  . Quit date: 05/28/2014  . Years since quitting: 6.3  Smokeless Tobacco Never Used  Tobacco Comment   quit in November 2015 after smoking x 20 yrs.    Labs: Recent Review Flowsheet Data    Labs for ITP Cardiac and Pulmonary Rehab Latest Ref Rng & Units 08/29/2019 09/28/2019   Trlycerides <150 mg/dL 199(H) -   PHART 7.350 - 7.450 7.393 7.343(L)    PCO2ART 32.0 - 48.0 mmHg 40.5 45.5  HCO3 20.0 - 28.0 mmol/L 24.2 24.1    ACIDBASEDEF 0.0 - 2.0 mmol/L 0.1 1.3    O2SAT % 90.7 94.1       Capillary Blood Glucose: Lab Results  Component Value Date   GLUCAP 144 (H) 03/02/2019     Pulmonary Assessment Scores:  Pulmonary Assessment Scores    Row Name 08/07/20 1257         ADL UCSD   ADL Phase Entry     SOB Score total 76           CAT Score   CAT Score 28           mMRC Score   mMRC Score 2           UCSD: Self-administered rating of dyspnea associated with activities of daily living (ADLs) 6-point scale (0 = "not at all" to 5 = "maximal or unable to do because of breathlessness")  Scoring Scores range from 0 to 120.  Minimally important difference is 5 units  CAT: CAT can identify the health  impairment of COPD patients and is better correlated with disease progression.  CAT has a scoring range of zero to 40. The CAT score is classified into four groups of low (less than 10), medium (10 - 20), high (21-30) and very high (31-40) based on the impact level of disease on health status. A CAT score over 10 suggests significant symptoms.  A worsening CAT score could be explained by an exacerbation, poor medication adherence, poor inhaler technique, or progression of COPD or comorbid conditions.  CAT MCID is 2 points  mMRC: mMRC (Modified Medical Research Council) Dyspnea Scale is used to assess the degree of baseline functional disability in patients of respiratory disease due to dyspnea. No minimal important difference is established. A decrease in score of 1 point or greater is considered a positive change.   Pulmonary Function Assessment:  Pulmonary Function Assessment - 08/07/20 1246      Breath   Shortness of Breath No           Exercise Target Goals: Exercise Program Goal: Individual exercise prescription set using results from initial 6 min walk test and THRR while considering  patient's activity barriers and safety.   Exercise Prescription Goal: Initial exercise prescription builds to 30-45 minutes a day of aerobic activity, 2-3 days per week.  Home exercise guidelines will be given to patient during program as part of exercise prescription that the participant will acknowledge.  Activity Barriers & Risk Stratification:  Activity Barriers & Cardiac Risk Stratification - 08/07/20 1244      Activity Barriers & Cardiac Risk Stratification   Activity Barriers Arthritis;Back Problems;Left Hip Replacement;Right Hip Replacement;Deconditioning    Cardiac Risk Stratification High           6 Minute Walk:  6 Minute Walk    Row Name 08/07/20 1546         6 Minute Walk   Phase Initial     Distance 900 feet     Walk Time 6 minutes     # of Rest Breaks 1     MPH 1.71      METS 2.67     RPE 10     Perceived Dyspnea  14     VO2 Peak 9.36     Symptoms Yes (comment)     Comments Stopped for ~30 sec due to desaturation to put oxygen on     Resting HR 104 bpm  Resting BP 112/70     Resting Oxygen Saturation  93 %     Exercise Oxygen Saturation  during 6 min walk 85 %     Max Ex. HR 141 bpm     Max Ex. BP 184/68     2 Minute Post BP 144/72           Interval HR   1 Minute HR 110     2 Minute HR 125     3 Minute HR 129     4 Minute HR 133     5 Minute HR 133     6 Minute HR 141     2 Minute Post HR 126     Interval Heart Rate? Yes           Interval Oxygen   Interval Oxygen? Yes     Baseline Oxygen Saturation % 93 %     1 Minute Oxygen Saturation % 85 %     1 Minute Liters of Oxygen 0 L     2 Minute Oxygen Saturation % 91 %     2 Minute Liters of Oxygen 2 L     3 Minute Oxygen Saturation % 90 %     3 Minute Liters of Oxygen 2 L     4 Minute Oxygen Saturation % 88 %     4 Minute Liters of Oxygen 2 L     5 Minute Oxygen Saturation % 88 %     5 Minute Liters of Oxygen 2 L     6 Minute Oxygen Saturation % 87 %     6 Minute Liters of Oxygen 2 L     2 Minute Post Oxygen Saturation % 97 %     2 Minute Post Liters of Oxygen 2 L            Oxygen Initial Assessment:  Oxygen Initial Assessment - 08/07/20 1545      Initial 6 min Walk   Oxygen Used Continuous    Liters per minute 2      Program Oxygen Prescription   Program Oxygen Prescription Continuous    Liters per minute 2      Intervention   Short Term Goals To learn and exhibit compliance with exercise, home and travel O2 prescription;To learn and understand importance of monitoring SPO2 with pulse oximeter and demonstrate accurate use of the pulse oximeter.;To learn and understand importance of maintaining oxygen saturations>88%;To learn and demonstrate proper pursed lip breathing techniques or other breathing techniques.;To learn and demonstrate proper use of respiratory  medications    Long  Term Goals Exhibits compliance with exercise, home and travel O2 prescription;Verbalizes importance of monitoring SPO2 with pulse oximeter and return demonstration;Maintenance of O2 saturations>88%;Exhibits proper breathing techniques, such as pursed lip breathing or other method taught during program session;Compliance with respiratory medication;Demonstrates proper use of MDI's           Oxygen Re-Evaluation:  Oxygen Re-Evaluation    Row Name 08/19/20 1451 09/16/20 1522 10/14/20 0756         Program Oxygen Prescription   Program Oxygen Prescription Continuous Continuous Continuous     Liters per minute 2 2 2            Home Oxygen   Home Oxygen Device E-Tanks;Home Concentrator E-Tanks;Home Concentrator E-Tanks;Home Concentrator     Sleep Oxygen Prescription CPAP CPAP CPAP     Home Exercise Oxygen Prescription Continuous Continuous Continuous     Liters per  minute 2 2 2      Home Resting Oxygen Prescription None None None     Compliance with Home Oxygen Use Yes Yes Yes           Goals/Expected Outcomes   Short Term Goals To learn and exhibit compliance with exercise, home and travel O2 prescription;To learn and understand importance of monitoring SPO2 with pulse oximeter and demonstrate accurate use of the pulse oximeter.;To learn and understand importance of maintaining oxygen saturations>88%;To learn and demonstrate proper pursed lip breathing techniques or other breathing techniques.;To learn and demonstrate proper use of respiratory medications To learn and exhibit compliance with exercise, home and travel O2 prescription;To learn and understand importance of monitoring SPO2 with pulse oximeter and demonstrate accurate use of the pulse oximeter.;To learn and understand importance of maintaining oxygen saturations>88%;To learn and demonstrate proper pursed lip breathing techniques or other breathing techniques.;To learn and demonstrate proper use of respiratory  medications To learn and exhibit compliance with exercise, home and travel O2 prescription;To learn and understand importance of monitoring SPO2 with pulse oximeter and demonstrate accurate use of the pulse oximeter.;To learn and understand importance of maintaining oxygen saturations>88%;To learn and demonstrate proper pursed lip breathing techniques or other breathing techniques.;To learn and demonstrate proper use of respiratory medications     Long  Term Goals Exhibits compliance with exercise, home and travel O2 prescription;Verbalizes importance of monitoring SPO2 with pulse oximeter and return demonstration;Maintenance of O2 saturations>88%;Exhibits proper breathing techniques, such as pursed lip breathing or other method taught during program session;Compliance with respiratory medication;Demonstrates proper use of MDI's Exhibits compliance with exercise, home and travel O2 prescription;Verbalizes importance of monitoring SPO2 with pulse oximeter and return demonstration;Maintenance of O2 saturations>88%;Exhibits proper breathing techniques, such as pursed lip breathing or other method taught during program session;Compliance with respiratory medication;Demonstrates proper use of MDI's Exhibits compliance with exercise, home and travel O2 prescription;Verbalizes importance of monitoring SPO2 with pulse oximeter and return demonstration;Maintenance of O2 saturations>88%;Exhibits proper breathing techniques, such as pursed lip breathing or other method taught during program session;Compliance with respiratory medication;Demonstrates proper use of MDI's     Goals/Expected Outcomes compliance compliance compliance            Oxygen Discharge (Final Oxygen Re-Evaluation):  Oxygen Re-Evaluation - 10/14/20 0756      Program Oxygen Prescription   Program Oxygen Prescription Continuous    Liters per minute 2      Home Oxygen   Home Oxygen Device E-Tanks;Home Concentrator    Sleep Oxygen  Prescription CPAP    Home Exercise Oxygen Prescription Continuous    Liters per minute 2    Home Resting Oxygen Prescription None    Compliance with Home Oxygen Use Yes      Goals/Expected Outcomes   Short Term Goals To learn and exhibit compliance with exercise, home and travel O2 prescription;To learn and understand importance of monitoring SPO2 with pulse oximeter and demonstrate accurate use of the pulse oximeter.;To learn and understand importance of maintaining oxygen saturations>88%;To learn and demonstrate proper pursed lip breathing techniques or other breathing techniques.;To learn and demonstrate proper use of respiratory medications    Long  Term Goals Exhibits compliance with exercise, home and travel O2 prescription;Verbalizes importance of monitoring SPO2 with pulse oximeter and return demonstration;Maintenance of O2 saturations>88%;Exhibits proper breathing techniques, such as pursed lip breathing or other method taught during program session;Compliance with respiratory medication;Demonstrates proper use of MDI's    Goals/Expected Outcomes compliance  Initial Exercise Prescription:  Initial Exercise Prescription - 08/07/20 1500      Date of Initial Exercise RX and Referring Provider   Date 08/07/20    Referring Provider Dr. Vaughan Browner     Expected Discharge Date 12/11/20      Oxygen   Oxygen Continuous    Liters 2      NuStep   Level 1    SPM 80    Minutes 17      Arm Ergometer   Level 1    RPM 60    Minutes 22      Prescription Details   Frequency (times per week) 2    Duration Progress to 30 minutes of continuous aerobic without signs/symptoms of physical distress      Intensity   THRR 40-80% of Max Heartrate 64-128    Ratings of Perceived Exertion 11-13    Perceived Dyspnea 0-4      Resistance Training   Training Prescription Yes    Weight 3 lbs    Reps 10-15           Perform Capillary Blood Glucose checks as needed.  Exercise  Prescription Changes:   Exercise Prescription Changes    Row Name 08/19/20 1400 09/02/20 1400 09/16/20 1500 09/30/20 1600 10/02/20 1400     Response to Exercise   Blood Pressure (Admit) 118/80 128/76 128/84 146/90 --   Blood Pressure (Exercise) 152/70 150/90 134/84 130/88 --   Blood Pressure (Exit) 118/70 124/70 120/80 130/74 --   Heart Rate (Admit) 98 bpm 95 bpm 94 bpm 96 bpm --   Heart Rate (Exercise) 115 bpm 118 bpm 111 bpm 114 bpm --   Heart Rate (Exit) 105 bpm 93 bpm 102 bpm 108 bpm --   Oxygen Saturation (Admit) 93 % 96 % 97 % 97 % --   Oxygen Saturation (Exercise) 96 % 96 % 94 % 96 % --   Oxygen Saturation (Exit) 97 % 97 % 97 % 96 % --   Rating of Perceived Exertion (Exercise) 12 12 12 12  --   Perceived Dyspnea (Exercise) 6 9 9 10  --   Duration Continue with 30 min of aerobic exercise without signs/symptoms of physical distress. Continue with 30 min of aerobic exercise without signs/symptoms of physical distress. Continue with 30 min of aerobic exercise without signs/symptoms of physical distress. Continue with 30 min of aerobic exercise without signs/symptoms of physical distress. --   Intensity THRR unchanged THRR unchanged THRR unchanged THRR unchanged --     Progression   Progression Continue to progress workloads to maintain intensity without signs/symptoms of physical distress. Continue to progress workloads to maintain intensity without signs/symptoms of physical distress. Continue to progress workloads to maintain intensity without signs/symptoms of physical distress. Continue to progress workloads to maintain intensity without signs/symptoms of physical distress. --     Horticulturist, commercial Prescription Yes Yes Yes Yes --   Weight 3 lbs 4 lbs 4 lbs 4 lbs --   Reps 10-15 10-15 10-15 10-15 --   Time -- 10 Minutes 10 Minutes 10 Minutes --     Oxygen   Oxygen Continuous Continuous Continuous Continuous --   Liters 2 2 2 2  --     NuStep   Level 1 1 2 2  --   SPM  86 96 94 90 --   Minutes 22 22 17 17  --   METs 1.9 2.1 2.1 2 --     Arm Ergometer   Level 1  2 2 2  --   RPM 46 43 48 50 --   Minutes 17 17 22 22  --   METs 1.4 1.5 1.6 2 --     Home Exercise Plan   Plans to continue exercise at -- -- -- -- Home (comment)   Frequency -- -- -- -- Add 3 additional days to program exercise sessions.   Initial Home Exercises Provided -- -- -- -- 10/02/20   Row Name 10/14/20 0750             Response to Exercise   Blood Pressure (Admit) 124/76       Blood Pressure (Exercise) 148/72       Blood Pressure (Exit) 132/74       Heart Rate (Admit) 99 bpm       Heart Rate (Exercise) 110 bpm       Heart Rate (Exit) 106 bpm       Oxygen Saturation (Admit) 97 %       Oxygen Saturation (Exercise) 96 %       Oxygen Saturation (Exit) 96 %       Rating of Perceived Exertion (Exercise) 12       Perceived Dyspnea (Exercise) 9       Duration Continue with 30 min of aerobic exercise without signs/symptoms of physical distress.       Intensity THRR unchanged               Progression   Progression Continue to progress workloads to maintain intensity without signs/symptoms of physical distress.               Resistance Training   Training Prescription Yes       Weight 5 lb       Reps 10-15       Time 10 Minutes               Oxygen   Oxygen Continuous       Liters 2               NuStep   Level 2       SPM 92       Minutes 17       METs 2.1               Arm Ergometer   Level 2.5       RPM 40       Minutes 22       METs 1.5              Exercise Comments:   Exercise Comments    Row Name 10/02/20 1439           Exercise Comments Reviewed home exercise program today with patient. He is not currently walking at home so he will start to walk and do some light resistance training at home.              Exercise Goals and Review:   Exercise Goals    Row Name 08/07/20 1550 08/19/20 1454 09/16/20 1525 10/14/20 0753       Exercise  Goals   Increase Physical Activity Yes Yes Yes Yes    Intervention Provide advice, education, support and counseling about physical activity/exercise needs.;Develop an individualized exercise prescription for aerobic and resistive training based on initial evaluation findings, risk stratification, comorbidities and participant's personal goals. Provide advice, education, support and counseling about physical activity/exercise needs.;Develop an individualized exercise prescription for aerobic and resistive training  based on initial evaluation findings, risk stratification, comorbidities and participant's personal goals. Provide advice, education, support and counseling about physical activity/exercise needs.;Develop an individualized exercise prescription for aerobic and resistive training based on initial evaluation findings, risk stratification, comorbidities and participant's personal goals. Provide advice, education, support and counseling about physical activity/exercise needs.;Develop an individualized exercise prescription for aerobic and resistive training based on initial evaluation findings, risk stratification, comorbidities and participant's personal goals.    Expected Outcomes Short Term: Attend rehab on a regular basis to increase amount of physical activity.;Long Term: Add in home exercise to make exercise part of routine and to increase amount of physical activity.;Long Term: Exercising regularly at least 3-5 days a week. Short Term: Attend rehab on a regular basis to increase amount of physical activity.;Long Term: Add in home exercise to make exercise part of routine and to increase amount of physical activity.;Long Term: Exercising regularly at least 3-5 days a week. Short Term: Attend rehab on a regular basis to increase amount of physical activity.;Long Term: Add in home exercise to make exercise part of routine and to increase amount of physical activity.;Long Term: Exercising regularly at  least 3-5 days a week. Short Term: Attend rehab on a regular basis to increase amount of physical activity.;Long Term: Add in home exercise to make exercise part of routine and to increase amount of physical activity.;Long Term: Exercising regularly at least 3-5 days a week.    Increase Strength and Stamina Yes Yes Yes Yes    Intervention Provide advice, education, support and counseling about physical activity/exercise needs.;Develop an individualized exercise prescription for aerobic and resistive training based on initial evaluation findings, risk stratification, comorbidities and participant's personal goals. Provide advice, education, support and counseling about physical activity/exercise needs.;Develop an individualized exercise prescription for aerobic and resistive training based on initial evaluation findings, risk stratification, comorbidities and participant's personal goals. Provide advice, education, support and counseling about physical activity/exercise needs.;Develop an individualized exercise prescription for aerobic and resistive training based on initial evaluation findings, risk stratification, comorbidities and participant's personal goals. Provide advice, education, support and counseling about physical activity/exercise needs.;Develop an individualized exercise prescription for aerobic and resistive training based on initial evaluation findings, risk stratification, comorbidities and participant's personal goals.    Expected Outcomes Short Term: Increase workloads from initial exercise prescription for resistance, speed, and METs.;Short Term: Perform resistance training exercises routinely during rehab and add in resistance training at home;Long Term: Improve cardiorespiratory fitness, muscular endurance and strength as measured by increased METs and functional capacity (6MWT) Short Term: Increase workloads from initial exercise prescription for resistance, speed, and METs.;Short Term:  Perform resistance training exercises routinely during rehab and add in resistance training at home;Long Term: Improve cardiorespiratory fitness, muscular endurance and strength as measured by increased METs and functional capacity (6MWT) Short Term: Increase workloads from initial exercise prescription for resistance, speed, and METs.;Short Term: Perform resistance training exercises routinely during rehab and add in resistance training at home;Long Term: Improve cardiorespiratory fitness, muscular endurance and strength as measured by increased METs and functional capacity (6MWT) Short Term: Increase workloads from initial exercise prescription for resistance, speed, and METs.;Short Term: Perform resistance training exercises routinely during rehab and add in resistance training at home;Long Term: Improve cardiorespiratory fitness, muscular endurance and strength as measured by increased METs and functional capacity (6MWT)    Able to understand and use rate of perceived exertion (RPE) scale Yes Yes Yes Yes    Intervention Provide education and explanation on how to use RPE  scale Provide education and explanation on how to use RPE scale Provide education and explanation on how to use RPE scale Provide education and explanation on how to use RPE scale    Expected Outcomes Short Term: Able to use RPE daily in rehab to express subjective intensity level;Long Term:  Able to use RPE to guide intensity level when exercising independently Short Term: Able to use RPE daily in rehab to express subjective intensity level;Long Term:  Able to use RPE to guide intensity level when exercising independently Short Term: Able to use RPE daily in rehab to express subjective intensity level;Long Term:  Able to use RPE to guide intensity level when exercising independently Short Term: Able to use RPE daily in rehab to express subjective intensity level;Long Term:  Able to use RPE to guide intensity level when exercising  independently    Able to understand and use Dyspnea scale Yes Yes Yes Yes    Intervention Provide education and explanation on how to use Dyspnea scale Provide education and explanation on how to use Dyspnea scale Provide education and explanation on how to use Dyspnea scale Provide education and explanation on how to use Dyspnea scale    Expected Outcomes Short Term: Able to use Dyspnea scale daily in rehab to express subjective sense of shortness of breath during exertion;Long Term: Able to use Dyspnea scale to guide intensity level when exercising independently Short Term: Able to use Dyspnea scale daily in rehab to express subjective sense of shortness of breath during exertion;Long Term: Able to use Dyspnea scale to guide intensity level when exercising independently Short Term: Able to use Dyspnea scale daily in rehab to express subjective sense of shortness of breath during exertion;Long Term: Able to use Dyspnea scale to guide intensity level when exercising independently Short Term: Able to use Dyspnea scale daily in rehab to express subjective sense of shortness of breath during exertion;Long Term: Able to use Dyspnea scale to guide intensity level when exercising independently    Knowledge and understanding of Target Heart Rate Range (THRR) Yes Yes Yes Yes    Intervention Provide education and explanation of THRR including how the numbers were predicted and where they are located for reference Provide education and explanation of THRR including how the numbers were predicted and where they are located for reference Provide education and explanation of THRR including how the numbers were predicted and where they are located for reference Provide education and explanation of THRR including how the numbers were predicted and where they are located for reference    Expected Outcomes Short Term: Able to state/look up THRR;Long Term: Able to use THRR to govern intensity when exercising independently;Short  Term: Able to use daily as guideline for intensity in rehab Short Term: Able to state/look up THRR;Long Term: Able to use THRR to govern intensity when exercising independently;Short Term: Able to use daily as guideline for intensity in rehab Short Term: Able to state/look up THRR;Long Term: Able to use THRR to govern intensity when exercising independently;Short Term: Able to use daily as guideline for intensity in rehab Short Term: Able to state/look up THRR;Long Term: Able to use THRR to govern intensity when exercising independently;Short Term: Able to use daily as guideline for intensity in rehab    Understanding of Exercise Prescription Yes Yes Yes Yes    Intervention Provide education, explanation, and written materials on patient's individual exercise prescription Provide education, explanation, and written materials on patient's individual exercise prescription Provide education, explanation, and written  materials on patient's individual exercise prescription Provide education, explanation, and written materials on patient's individual exercise prescription    Expected Outcomes Short Term: Able to explain program exercise prescription;Long Term: Able to explain home exercise prescription to exercise independently Short Term: Able to explain program exercise prescription;Long Term: Able to explain home exercise prescription to exercise independently Short Term: Able to explain program exercise prescription;Long Term: Able to explain home exercise prescription to exercise independently Short Term: Able to explain program exercise prescription;Long Term: Able to explain home exercise prescription to exercise independently           Exercise Goals Re-Evaluation :  Exercise Goals Re-Evaluation    Row Name 08/19/20 1455 09/16/20 1525 10/14/20 0754         Exercise Goal Re-Evaluation   Exercise Goals Review Increase Physical Activity;Increase Strength and Stamina;Able to understand and use rate of  perceived exertion (RPE) scale;Able to understand and use Dyspnea scale;Knowledge and understanding of Target Heart Rate Range (THRR);Understanding of Exercise Prescription Increase Physical Activity;Increase Strength and Stamina;Able to understand and use rate of perceived exertion (RPE) scale;Able to understand and use Dyspnea scale;Knowledge and understanding of Target Heart Rate Range (THRR);Understanding of Exercise Prescription Increase Physical Activity;Increase Strength and Stamina;Able to understand and use rate of perceived exertion (RPE) scale;Able to understand and use Dyspnea scale;Knowledge and understanding of Target Heart Rate Range (THRR);Understanding of Exercise Prescription     Comments Pt has attended 3 exercise sessions. He maintains a positive attitude and gives good effort. He is currently exercising at 1.9 METs on the stepper. Will continue to monitor and progress as able. Pt has attended 11 exercise sessions. He reports that he feels like he is getting stronger and is able to do his ADL's with less shortness of breath. He currently exercises at 2.1 METs on the stepper. Will continue to monitor and progress as able. Patient has completed 18 exercise sessions. He is progressing exercise well. He has started exercising at home regularly and reports that he feels it is helping him progress. He is getting stronger and is increasing his intensities on both aerobic training and resistance training. He is currently exercising at 2.1 METs on the NuStep. Will continue to monitor and progress as able.     Expected Outcomes Through exercise at rehab and by engaging in a home exercise program, the patient will meet their stated goals. Through exercise at rehab and by engaging in a home exercise program, the patient will meet their stated goals. Through exercise at rehab and by engaging in a home exercise program, the patient will meet their stated goals.            Discharge Exercise  Prescription (Final Exercise Prescription Changes):  Exercise Prescription Changes - 10/14/20 0750      Response to Exercise   Blood Pressure (Admit) 124/76    Blood Pressure (Exercise) 148/72    Blood Pressure (Exit) 132/74    Heart Rate (Admit) 99 bpm    Heart Rate (Exercise) 110 bpm    Heart Rate (Exit) 106 bpm    Oxygen Saturation (Admit) 97 %    Oxygen Saturation (Exercise) 96 %    Oxygen Saturation (Exit) 96 %    Rating of Perceived Exertion (Exercise) 12    Perceived Dyspnea (Exercise) 9    Duration Continue with 30 min of aerobic exercise without signs/symptoms of physical distress.    Intensity THRR unchanged      Progression   Progression Continue to progress workloads  to maintain intensity without signs/symptoms of physical distress.      Resistance Training   Training Prescription Yes    Weight 5 lb    Reps 10-15    Time 10 Minutes      Oxygen   Oxygen Continuous    Liters 2      NuStep   Level 2    SPM 92    Minutes 17    METs 2.1      Arm Ergometer   Level 2.5    RPM 40    Minutes 22    METs 1.5           Nutrition:  Target Goals: Understanding of nutrition guidelines, daily intake of sodium 1500mg , cholesterol 200mg , calories 30% from fat and 7% or less from saturated fats, daily to have 5 or more servings of fruits and vegetables.  Biometrics:  Pre Biometrics - 10/15/20 0756      Pre Biometrics   Weight 134.4 kg            Nutrition Therapy Plan and Nutrition Goals:  Nutrition Therapy & Goals - 10/03/20 1200      Personal Nutrition Goals   Comments We continue to provide nutritional education through handouts.      Intervention Plan   Intervention Nutrition handout(s) given to patient.           Nutrition Assessments:  Nutrition Assessments - 08/07/20 1305      MEDFICTS Scores   Pre Score 15          MEDIFICTS Score Key:  ?70 Need to make dietary changes   40-70 Heart Healthy Diet  ? 40 Therapeutic Level  Cholesterol Diet   Picture Your Plate Scores:  <15 Unhealthy dietary pattern with much room for improvement.  41-50 Dietary pattern unlikely to meet recommendations for good health and room for improvement.  51-60 More healthful dietary pattern, with some room for improvement.   >60 Healthy dietary pattern, although there may be some specific behaviors that could be improved.    Nutrition Goals Re-Evaluation:   Nutrition Goals Discharge (Final Nutrition Goals Re-Evaluation):   Psychosocial: Target Goals: Acknowledge presence or absence of significant depression and/or stress, maximize coping skills, provide positive support system. Participant is able to verbalize types and ability to use techniques and skills needed for reducing stress and depression.  Initial Review & Psychosocial Screening:  Initial Psych Review & Screening - 08/07/20 1249      Initial Review   Current issues with None Identified      Family Dynamics   Good Support System? Yes    Comments His daughter is his support system. She lives nearby. He sees her frequently and they call each other frequently.      Barriers   Psychosocial barriers to participate in program There are no identifiable barriers or psychosocial needs.      Screening Interventions   Interventions Encouraged to exercise    Expected Outcomes Long Term Goal: Stressors or current issues are controlled or eliminated.;Short Term goal: Identification and review with participant of any Quality of Life or Depression concerns found by scoring the questionnaire.           Quality of Life Scores:  Quality of Life - 08/07/20 1544      Quality of Life   Select Quality of Life      Quality of Life Scores   Health/Function Pre 11.14 %    Socioeconomic Pre 13.5 %  Psych/Spiritual Pre 18.86 %    Family Pre 24 %    GLOBAL Pre 15.18 %          Scores of 19 and below usually indicate a poorer quality of life in these areas.  A difference  of  2-3 points is a clinically meaningful difference.  A difference of 2-3 points in the total score of the Quality of Life Index has been associated with significant improvement in overall quality of life, self-image, physical symptoms, and general health in studies assessing change in quality of life.   PHQ-9: Recent Review Flowsheet Data    Depression screen [facility] 2/9 08/07/2020   Decreased Interest 1   Down, Depressed, Hopeless 1   PHQ - 2 Score 2   Altered sleeping 0   Tired, decreased energy 3   Change in appetite 0   Feeling bad or failure about yourself  1   Trouble concentrating 0   Moving slowly or fidgety/restless 1   Suicidal thoughts 0   PHQ-9 Score 7   Difficult doing work/chores Not difficult at all     Interpretation of Total Score  Total Score Depression Severity:  1-4 = Minimal depression, 5-9 = Mild depression, 10-14 = Moderate depression, 15-19 = Moderately severe depression, 20-27 = Severe depression   Psychosocial Evaluation and Intervention:   Psychosocial Re-Evaluation:  Psychosocial Re-Evaluation    [place] Name 08/18/20 0920 09/12/20 0953 10/03/20 1157         Psychosocial Re-Evaluation   Current issues with None Identified None Identified None Identified     Comments Patient's initial QOL score was 15.18% overall and his PHQ-9 score was 7. He has been on cymbalta 30 mg BID but reproted that he was not taking during pharmacy review at his orientation visit. He is new to the program completing 2 sessions. Will continue to moniotr. Patient continues to have no psychosocial issues identified. He has completed 10 sessions with consistent attendance. He demonstrates a very positive attitude and works hard during the sessions. Will continue to monitor. Patient continues to have no psychosocial issues identified. He has completed 15 sessions with consistent attendance. He demonstrates a very positive attitude and works hard during the sessions. Will continue to monitor.      Expected Outcomes Patient will have no psychosocial issues identified at discharge. Patient will have no psychosocial issues identified at discharge. Patient will have no psychosocial issues identified at discharge.     Interventions Encouraged to attend Pulmonary Rehabilitation for the exercise;Relaxation education;Stress management education Encouraged to attend Pulmonary Rehabilitation for the exercise;Relaxation education;Stress management education Encouraged to attend Pulmonary Rehabilitation for the exercise;Relaxation education;Stress management education     Continue Psychosocial Services  No Follow up required No Follow up required No Follow up required            Psychosocial Discharge (Final Psychosocial Re-Evaluation):  Psychosocial Re-Evaluation - 10/03/20 1157      Psychosocial Re-Evaluation   Current issues with None Identified    Comments Patient continues to have no psychosocial issues identified. He has completed 15 sessions with consistent attendance. He demonstrates a very positive attitude and works hard during the sessions. Will continue to monitor.    Expected Outcomes Patient will have no psychosocial issues identified at discharge.    Interventions Encouraged to attend Pulmonary Rehabilitation for the exercise;Relaxation education;Stress management education    Continue Psychosocial Services  No Follow up required            Education:  Education Goals: Education classes will be provided on a weekly basis, covering required topics. Participant will state understanding/return demonstration of topics presented.  Learning Barriers/Preferences:  Learning Barriers/Preferences - 08/07/20 1246      Learning Barriers/Preferences   Learning Barriers None    Learning Preferences Skilled Demonstration;Individual Instruction           Education Topics: How Lungs Work and Diseases: - Discuss the anatomy of the lungs and diseases that can affect the lungs, such  as COPD.   Exercise: -Discuss the importance of exercise, FITT principles of exercise, normal and abnormal responses to exercise, and how to exercise safely.   Environmental Irritants: -Discuss types of environmental irritants and how to limit exposure to environmental irritants. Flowsheet Row PULMONARY REHAB OTHER RESPIRATORY from 10/09/2020 in [place]  Date 09/18/20  Educator DF  Instruction Review Code 2- Demonstrated Understanding      Meds/Inhalers and oxygen: - Discuss respiratory medications, definition of an inhaler and oxygen, and the proper way to use an inhaler and oxygen. Flowsheet Row PULMONARY REHAB OTHER RESPIRATORY from 10/09/2020 in [place]  Date 09/25/20  Educator Handout      Energy Saving Techniques: - Discuss methods to conserve energy and decrease shortness of breath when performing activities of daily living.  Flowsheet Row PULMONARY REHAB OTHER RESPIRATORY from 10/09/2020 in [place]  Date 10/02/20  Educator Hand out  Instruction Review Code 1- Verbalizes Understanding      Bronchial Hygiene / Breathing Techniques: - Discuss breathing mechanics, pursed-lip breathing technique,  proper posture, effective ways to clear airways, and other functional breathing techniques Flowsheet Row PULMONARY REHAB OTHER RESPIRATORY from 10/09/2020 in [place]  Date 10/09/20  Educator pb  Instruction Review Code 1- Verbalizes Understanding      Cleaning Equipment: - Provides group verbal and written instruction about the health risks of elevated stress, cause of high stress, and healthy ways to reduce stress.   Nutrition I: Fats: - Discuss the types of cholesterol, what cholesterol does to the body, and how cholesterol levels can be controlled.   Nutrition II: Labels: -Discuss the different components of food labels and how to read food labels.   Respiratory  Infections: - Discuss the signs and symptoms of respiratory infections, ways to prevent respiratory infections, and the importance of seeking medical treatment when having a respiratory infection.   Stress I: Signs and Symptoms: - Discuss the causes of stress, how stress may lead to anxiety and depression, and ways to limit stress. Flowsheet Row PULMONARY REHAB OTHER RESPIRATORY from 10/09/2020 in [place]  Date 08/14/20  Educator [facility]  Instruction Review Code 1- Verbalizes Understanding      Stress II: Relaxation: -Discuss relaxation techniques to limit stress. Flowsheet Row PULMONARY REHAB OTHER RESPIRATORY from 10/09/2020 in [place]  Date 08/21/20  Educator [facility]  Instruction Review Code 2- Demonstrated Understanding      Oxygen for Home/Travel: - Discuss how to prepare for travel when on oxygen and proper ways to transport and store oxygen to ensure safety. Flowsheet Row PULMONARY REHAB OTHER RESPIRATORY from 10/09/2020 in [place]  Date 08/28/20  Educator DJ  Instruction Review Code 1- Verbalizes Understanding      Knowledge Questionnaire Score:  Knowledge Questionnaire Score - 08/07/20 1258      Knowledge Questionnaire Score   Pre Score 5/18           Core Components/Risk Factors/Patient  Goals at Admission:  Personal Goals and Risk Factors at Admission - 08/07/20 1247      Core Components/Risk Factors/Patient Goals on Admission    Weight Management Yes;Obesity    Intervention Weight Management: Develop a combined nutrition and exercise program designed to reach desired caloric intake, while maintaining appropriate intake of nutrient and fiber, sodium and fats, and appropriate energy expenditure required for the weight goal.;Weight Management: Provide education and appropriate resources to help participant work on and attain dietary goals.;Weight Management/Obesity: Establish reasonable short term and  long term weight goals.;Obesity: Provide education and appropriate resources to help participant work on and attain dietary goals.    Admit Weight 294 lb (133.4 kg)    Goal Weight: Short Term 274 lb (124.3 kg)    Goal Weight: Long Term 250 lb (113.4 kg)    Expected Outcomes Short Term: Continue to assess and modify interventions until short term weight is achieved;Long Term: Adherence to nutrition and physical activity/exercise program aimed toward attainment of established weight goal;Weight Maintenance: Understanding of the daily nutrition guidelines, which includes 25-35% calories from fat, 7% or less cal from saturated fats, less than 200mg  cholesterol, less than 1.5gm of sodium, & 5 or more servings of fruits and vegetables daily;Weight Loss: Understanding of general recommendations for a balanced deficit meal plan, which promotes 1-2 lb weight loss per week and includes a negative energy balance of [phone] kcal/d;Understanding recommendations for meals to include 15-35% energy as protein, 25-35% energy from fat, 35-60% energy from carbohydrates, less than 200mg  of dietary cholesterol, 20-35 gm of total fiber daily;Understanding of distribution of calorie intake throughout the day with the consumption of 4-5 meals/snacks;Weight Gain: Understanding of general recommendations for a high calorie, high protein meal plan that promotes weight gain by distributing calorie intake throughout the day with the consumption for 4-5 meals, snacks, and/or supplements    Improve shortness of breath with ADL's Yes    Intervention Provide education, individualized exercise plan and daily activity instruction to help decrease symptoms of SOB with activities of daily living.    Expected Outcomes Short Term: Improve cardiorespiratory fitness to achieve a reduction of symptoms when performing ADLs;Long Term: Be able to perform more ADLs without symptoms or delay the onset of symptoms           Core Components/Risk  Factors/Patient Goals Review:   Goals and Risk Factor Review    Row Name 08/18/20 9323 09/12/20 0954 10/03/20 1206         Core Components/Risk Factors/Patient Goals Review   Personal Goals Review Improve shortness of breath with ADL's;Weight Management/Obesity Improve shortness of breath with ADL's;Weight Management/Obesity Improve shortness of breath with ADL's;Weight Management/Obesity     Review Patient is new to the program completing 2 sessions. He was referred to pulmonary rehab for increased SOB secondary to ILD. His personal goals are to return to his ADL's and increase his energy and strength. Will continue to monitor for progress. Patient has completed 10 sessions maintaining his weight since his initial visit. He is doing very well with progression. He saw his pulmonologist 09/19/20 for a routine follow up. He changed his MDI from [facility] to [place]. His personal goals are to return to his ADL's; increase his strength and energy. We will continue to monitor as patient works toward meeting these goals. Patient has completed 15 sessions and also noted some weight gain since his initial visit 3#. He is doing very well with SOB and continues to work hard in  [place].  His personal goals are to return to his ADL's; increase his strength and energy. We will continue to monitor as patient works toward meeting these goals.     Expected Outcomes Patient will complete the program meeting both personal and program goals. Patient will complete the program meeting both personal and program goals. Patient will complete the program meeting both personal and program goals.            Core Components/Risk Factors/Patient Goals at Discharge (Final Review):   Goals and Risk Factor Review - 10/03/20 1206      Core Components/Risk Factors/Patient Goals Review   Personal Goals Review Improve shortness of breath with ADL's;Weight Management/Obesity    Review Patient has completed 15 sessions and also  noted some weight gain since his initial visit 3#. He is doing very well with SOB and continues to work hard in [place].  His personal goals are to return to his ADL's; increase his strength and energy. We will continue to monitor as patient works toward meeting these goals.    Expected Outcomes Patient will complete the program meeting both personal and program goals.           ITP Comments:   Comments: ITP REVIEW Pt is making expected progress toward pulmonary rehab goals after completing 15 sessions. Recommend continued exercise, life style modification, education, and utilization of breathing techniques to increase stamina and strength and decrease shortness of breath with exertion.

## 2020-10-21 ENCOUNTER — Encounter (HOSPITAL_COMMUNITY)
Admission: RE | Admit: 2020-10-21 | Discharge: 2020-10-21 | Disposition: A | Discharge status: Home / Self Care | Payer: 59 | Source: Ambulatory Visit | Attending: Pulmonary Disease | Admitting: Pulmonary Disease

## 2020-10-21 ENCOUNTER — Other Ambulatory Visit: Payer: Self-pay

## 2020-10-21 DIAGNOSIS — J849 Interstitial pulmonary disease, unspecified: Secondary | ICD-10-CM

## 2020-10-21 NOTE — Progress Notes (Signed)
Daily Session Note  Patient Details  Name: Mathew Lara MRN: [id number] Date of Birth: June 26, 1961 Referring Provider:   Flowsheet Row PULMONARY REHAB OTHER RESP ORIENTATION from 08/07/2020 in [place]  Referring Provider Dr. Vaughan Browner       Encounter Date: 10/21/2020  Check In:  Session Check In - 10/21/20 1330      Check-In   Supervising physician immediately available to respond to emergencies [facility] MD immediately available    Physician(s) Dr. Johnsie Cancel    Location [facility]    Staff Present Geanie Cooley, RN;Madison Audria Nine, MS, Exercise Physiologist;Terez Freimark Kris Mouton, MS, ACSM-CEP, Exercise Physiologist    Virtual Visit No    Medication changes reported     No    Fall or balance concerns reported    No    Tobacco Cessation No Change    Warm-up and Cool-down Performed as group-led instruction    Resistance Training Performed Yes    VAD Patient? No    PAD/SET Patient? No      Pain Assessment   Currently in Pain? Yes    Pain Score 6     Pain Location Back    Pain Orientation Lower    Pain Descriptors / Indicators Aching;Constant    Pain Type Chronic pain    Pain Onset More than a month ago    Pain Frequency Constant    Multiple Pain Sites No           Capillary Blood Glucose: No results found for this or any previous visit (from the past 24 hour(s)).    Social History   Tobacco Use  Smoking Status Former Smoker  . Packs/day: 1.00  . Years: 42.00  . Pack years: 42.00  . Types: Cigarettes  . Start date: 09/07/1971  . Quit date: 05/28/2014  . Years since quitting: 6.4  Smokeless Tobacco Never Used  Tobacco Comment   quit in November 2015 after smoking x 20 yrs.    Goals Met:  Independence with exercise equipment Exercise tolerated well No report of cardiac concerns or symptoms Strength training completed today  Goals Unmet:  Not Applicable  Comments: checkout time is 1430   Dr. Kathie Dike is Medical  Director for [facility].

## 2020-10-23 ENCOUNTER — Encounter (HOSPITAL_COMMUNITY)
Admission: RE | Admit: 2020-10-23 | Discharge: 2020-10-23 | Disposition: A | Discharge status: Home / Self Care | Payer: 59 | Source: Ambulatory Visit | Attending: Pulmonary Disease | Admitting: Pulmonary Disease

## 2020-10-23 ENCOUNTER — Other Ambulatory Visit: Payer: Self-pay

## 2020-10-23 DIAGNOSIS — J849 Interstitial pulmonary disease, unspecified: Secondary | ICD-10-CM

## 2020-10-23 NOTE — Progress Notes (Signed)
Daily Session Note  Patient Details  Name: Mathew Lara MRN: [id number] Date of Birth: 03/02/1961 Referring Provider:   Flowsheet Row PULMONARY REHAB OTHER RESP ORIENTATION from 08/07/2020 in [place]  Referring Provider Dr. Vaughan Browner       Encounter Date: 10/23/2020  Check In:  Session Check In - 10/23/20 1330      Check-In   Supervising physician immediately available to respond to emergencies [facility] MD immediately available    Physician(s) Dr. Johnsie Cancel    Location [facility]    Staff Present Aundra Dubin, RN, BSN;Madison Audria Nine, MS, Exercise Physiologist;Dalton Kris Mouton, MS, ACSM-CEP, Exercise Physiologist    Virtual Visit No    Medication changes reported     No    Fall or balance concerns reported    No    Tobacco Cessation No Change    Warm-up and Cool-down Performed as group-led instruction    Resistance Training Performed Yes    VAD Patient? No    PAD/SET Patient? No      Pain Assessment   Currently in Pain? Yes    Pain Score 6     Pain Location Back    Pain Orientation Lower    Pain Descriptors / Indicators Aching;Constant    Pain Type Chronic pain    Pain Onset More than a month ago    Pain Frequency Constant    Pain Relieving Factors Oxycodone    Multiple Pain Sites No           Capillary Blood Glucose: No results found for this or any previous visit (from the past 24 hour(s)).    Social History   Tobacco Use  Smoking Status Former Smoker  . Packs/day: 1.00  . Years: 42.00  . Pack years: 42.00  . Types: Cigarettes  . Start date: 09/07/1971  . Quit date: 05/28/2014  . Years since quitting: 6.4  Smokeless Tobacco Never Used  Tobacco Comment   quit in November 2015 after smoking x 20 yrs.    Goals Met:  Proper associated with RPD/PD & O2 Sat Independence with exercise equipment Improved SOB with ADL's Using PLB without cueing & demonstrates good technique Exercise tolerated well No report of cardiac  concerns or symptoms Strength training completed today  Goals Unmet:  Not Applicable  Comments: Check out 1430.   Dr. Kathie Dike is Medical Director for [facility]monary Re[facility].

## 2020-10-28 ENCOUNTER — Encounter (HOSPITAL_COMMUNITY)
Admission: RE | Admit: 2020-10-28 | Discharge: 2020-10-28 | Disposition: A | Discharge status: Home / Self Care | Payer: 59 | Source: Ambulatory Visit | Attending: Pulmonary Disease | Admitting: Pulmonary Disease

## 2020-10-28 ENCOUNTER — Other Ambulatory Visit: Payer: Self-pay

## 2020-10-28 VITALS — Wt 298.5 lb

## 2020-10-28 DIAGNOSIS — J849 Interstitial pulmonary disease, unspecified: Secondary | ICD-10-CM

## 2020-10-28 NOTE — Progress Notes (Signed)
Daily Session Note  Patient Details  Name: Mathew Lara MRN: [id number] Date of Birth: October 31, 1960 Referring Provider:   [place] from 08/07/2020 in [place]  Referring Provider Dr. Vaughan Browner       Encounter Date: 10/28/2020  Check In:  Session Check In - 10/28/20 1330      Check-In   Physician(s) Dr. Domenic Polite    Location [facility]    Staff Present Geanie Cooley, RN;Madison Audria Nine, MS, Exercise Physiologist;Dalton Kris Mouton, MS, ACSM-CEP, Exercise Physiologist    Virtual Visit No    Medication changes reported     No    Fall or balance concerns reported    No    Tobacco Cessation No Change    Warm-up and Cool-down Performed as group-led instruction    Resistance Training Performed Yes    VAD Patient? No    PAD/SET Patient? No      Pain Assessment   Currently in Pain? No/denies    Pain Score 5     Pain Location Back    Pain Orientation Lower    Pain Descriptors / Indicators Constant    Pain Type Chronic pain    Pain Onset More than a month ago    Pain Frequency Constant    Multiple Pain Sites No           Capillary Blood Glucose: No results found for this or any previous visit (from the past 24 hour(s)).    Social History   Tobacco Use  Smoking Status Former Smoker  . Packs/day: 1.00  . Years: 42.00  . Pack years: 42.00  . Types: Cigarettes  . Start date: 09/07/1971  . Quit date: 05/28/2014  . Years since quitting: 6.4  Smokeless Tobacco Never Used  Tobacco Comment   quit in November 2015 after smoking x 20 yrs.    Goals Met:  Proper associated with RPD/PD & O2 Sat Independence with exercise equipment Improved SOB with ADL's Exercise tolerated well No report of cardiac concerns or symptoms Strength training completed today  Goals Unmet:  Not Applicable  Comments: check out @ 2:30   Dr. Kathie Dike is Medical Director for [facility] Pulmonary Rehab.

## 2020-10-30 ENCOUNTER — Other Ambulatory Visit: Payer: Self-pay

## 2020-10-30 ENCOUNTER — Encounter (HOSPITAL_COMMUNITY)
Admission: RE | Admit: 2020-10-30 | Discharge: 2020-10-30 | Disposition: A | Discharge status: Home / Self Care | Payer: 59 | Source: Ambulatory Visit | Attending: Pulmonary Disease | Admitting: Pulmonary Disease

## 2020-10-30 DIAGNOSIS — J849 Interstitial pulmonary disease, unspecified: Secondary | ICD-10-CM

## 2020-10-30 NOTE — Progress Notes (Signed)
Daily Session Note  Patient Details  Name: AIJALON KIRTZ MRN: [id number] Date of Birth: March 20, 1961 Referring Provider:   Flowsheet Row PULMONARY REHAB OTHER RESP ORIENTATION from 08/07/2020 in [place]  Referring Provider Dr. Vaughan Browner       Encounter Date: 10/30/2020  Check In:  Session Check In - 10/30/20 1330      Check-In   Supervising physician immediately available to respond to emergencies [facility] MD immediately available    Physician(s) Dr. Domenic Polite    Location [facility]    Staff Present Geanie Cooley, RN;Madison Audria Nine, MS, Exercise Physiologist;Adeli Frost Kris Mouton, MS, ACSM-CEP, Exercise Physiologist    Virtual Visit No    Medication changes reported     No    Fall or balance concerns reported    No    Tobacco Cessation No Change    Warm-up and Cool-down Performed as group-led instruction    Resistance Training Performed Yes    VAD Patient? No    PAD/SET Patient? No      Pain Assessment   Currently in Pain? Yes    Pain Score 5     Pain Location Back    Pain Orientation Lower    Pain Descriptors / Indicators Constant    Pain Onset More than a month ago    Pain Frequency Constant    Multiple Pain Sites No           Capillary Blood Glucose: No results found for this or any previous visit (from the past 24 hour(s)).    Social History   Tobacco Use  Smoking Status Former Smoker  . Packs/day: 1.00  . Years: 42.00  . Pack years: 42.00  . Types: Cigarettes  . Start date: 09/07/1971  . Quit date: 05/28/2014  . Years since quitting: 6.4  Smokeless Tobacco Never Used  Tobacco Comment   quit in November 2015 after smoking x 20 yrs.    Goals Met:  Independence with exercise equipment Exercise tolerated well No report of cardiac concerns or symptoms Strength training completed today  Goals Unmet:  Not Applicable  Comments: checkout time is 1430   Dr. Kathie Dike is Medical Director for [facility] Pulmonary  Rehab.

## 2020-11-04 ENCOUNTER — Other Ambulatory Visit: Payer: Self-pay

## 2020-11-04 ENCOUNTER — Encounter (HOSPITAL_COMMUNITY)
Admission: RE | Admit: 2020-11-04 | Discharge: 2020-11-04 | Disposition: A | Discharge status: Home / Self Care | Payer: Self-pay | Source: Ambulatory Visit | Attending: Pulmonary Disease | Admitting: Pulmonary Disease

## 2020-11-04 DIAGNOSIS — J849 Interstitial pulmonary disease, unspecified: Secondary | ICD-10-CM

## 2020-11-04 NOTE — Progress Notes (Signed)
Daily Session Note  Patient Details  Name: Mathew Lara MRN: [id number] Date of Birth: 1960-09-11 Referring Provider:   Flowsheet Row PULMONARY REHAB OTHER RESP ORIENTATION from 08/07/2020 in [place]  Referring Provider Dr. Vaughan Browner       Encounter Date: 11/04/2020  Check In:  Session Check In - 11/04/20 1330      Check-In   Supervising physician immediately available to respond to emergencies [facility] MD immediately available    Physician(s) Dr. Harl Bowie    Location [facility]    Staff Present Cathren Harsh, MS, Exercise Physiologist;Dalton Kris Mouton, MS, ACSM-CEP, Exercise Physiologist    Virtual Visit No    Medication changes reported     No    Fall or balance concerns reported    No    Tobacco Cessation No Change    Warm-up and Cool-down Performed as group-led instruction    Resistance Training Performed Yes    VAD Patient? No    PAD/SET Patient? No      Pain Assessment   Currently in Pain? Yes    Pain Score 5     Pain Location Back    Pain Orientation Lower    Pain Descriptors / Indicators Constant    Pain Type Chronic pain    Pain Onset More than a month ago    Pain Frequency Constant    Multiple Pain Sites No           Capillary Blood Glucose: No results found for this or any previous visit (from the past 24 hour(s)).    Social History   Tobacco Use  Smoking Status Former Smoker  . Packs/day: 1.00  . Years: 42.00  . Pack years: 42.00  . Types: Cigarettes  . Start date: 09/07/1971  . Quit date: 05/28/2014  . Years since quitting: 6.4  Smokeless Tobacco Never Used  Tobacco Comment   quit in November 2015 after smoking x 20 yrs.    Goals Met:  Independence with exercise equipment Exercise tolerated well No report of cardiac concerns or symptoms Strength training completed today  Goals Unmet:  Not Applicable  Comments: checkout time is 1430   Dr. Kathie Dike is Medical Director for [facility]  Re[facility].

## 2020-11-06 ENCOUNTER — Other Ambulatory Visit: Payer: Self-pay

## 2020-11-06 ENCOUNTER — Encounter (HOSPITAL_COMMUNITY)
Admission: RE | Admit: 2020-11-06 | Discharge: 2020-11-06 | Disposition: A | Discharge status: Home / Self Care | Payer: 59 | Source: Ambulatory Visit | Attending: Pulmonary Disease | Admitting: Pulmonary Disease

## 2020-11-06 DIAGNOSIS — J849 Interstitial pulmonary disease, unspecified: Secondary | ICD-10-CM

## 2020-11-06 NOTE — Progress Notes (Signed)
Daily Session Note  Patient Details  Name: Mathew Lara MRN: [id number] Date of Birth: December 26, 1960 Referring Provider:   Flowsheet Row PULMONARY REHAB OTHER RESP ORIENTATION from 08/07/2020 in [place]  Referring Provider Dr. Vaughan Browner       Encounter Date: 11/06/2020  Check In:  Session Check In - 11/06/20 1330      Check-In   Supervising physician immediately available to respond to emergencies [facility] MD immediately available    Physician(s) Dr. Harl Bowie    Location [facility]    Staff Present Cathren Harsh, MS, Exercise Physiologist;Kenesha Moshier Kris Mouton, MS, ACSM-CEP, Exercise Physiologist    Virtual Visit No    Medication changes reported     No    Fall or balance concerns reported    No    Tobacco Cessation No Change    Warm-up and Cool-down Performed as group-led instruction    Resistance Training Performed Yes    VAD Patient? No    PAD/SET Patient? No      Pain Assessment   Currently in Pain? Yes    Pain Score 5     Pain Location Back    Pain Orientation Lower    Pain Descriptors / Indicators Constant    Pain Type Chronic pain    Pain Onset More than a month ago    Pain Frequency Constant    Multiple Pain Sites No           Capillary Blood Glucose: No results found for this or any previous visit (from the past 24 hour(s)).    Social History   Tobacco Use  Smoking Status Former Smoker  . Packs/day: 1.00  . Years: 42.00  . Pack years: 42.00  . Types: Cigarettes  . Start date: 09/07/1971  . Quit date: 05/28/2014  . Years since quitting: 6.4  Smokeless Tobacco Never Used  Tobacco Comment   quit in November 2015 after smoking x 20 yrs.    Goals Met:  Independence with exercise equipment Exercise tolerated well No report of cardiac concerns or symptoms Strength training completed today  Goals Unmet:  Not Applicable  Comments: checkout time is 1430   Dr. Kathie Dike is Medical Director for [facility] Pulmonary  Rehab.

## 2020-11-11 ENCOUNTER — Other Ambulatory Visit: Payer: Self-pay

## 2020-11-11 ENCOUNTER — Encounter (HOSPITAL_COMMUNITY)
Admission: RE | Admit: 2020-11-11 | Discharge: 2020-11-11 | Disposition: A | Discharge status: Home / Self Care | Payer: 59 | Source: Ambulatory Visit | Attending: Pulmonary Disease | Admitting: Pulmonary Disease

## 2020-11-11 VITALS — Wt 298.1 lb

## 2020-11-11 DIAGNOSIS — J849 Interstitial pulmonary disease, unspecified: Secondary | ICD-10-CM

## 2020-11-11 NOTE — Progress Notes (Signed)
Daily Session Note  Patient Details  Name: Mathew Lara MRN: [id number] Date of Birth: August 22, 1961 Referring Provider:   Flowsheet Row PULMONARY REHAB OTHER RESP ORIENTATION from 08/07/2020 in [place]  Referring Provider Dr. Vaughan Browner       Encounter Date: 11/11/2020  Check In:  Session Check In - 11/11/20 1330      Check-In   Supervising physician immediately available to respond to emergencies [facility] MD immediately available    Physician(s) Dr. Harl Bowie    Location [facility]    Staff Present Cathren Harsh, MS, Exercise Physiologist;Avianah Pellman Kris Mouton, MS, ACSM-CEP, Exercise Physiologist    Virtual Visit No    Medication changes reported     No    Fall or balance concerns reported    No    Tobacco Cessation No Change    Warm-up and Cool-down Performed as group-led instruction    Resistance Training Performed Yes    VAD Patient? No    PAD/SET Patient? No      Pain Assessment   Currently in Pain? Yes    Pain Score 5     Pain Location Back    Pain Orientation Lower    Pain Descriptors / Indicators Constant    Pain Type Chronic pain    Pain Onset More than a month ago    Pain Frequency Constant    Multiple Pain Sites No           Capillary Blood Glucose: No results found for this or any previous visit (from the past 24 hour(s)).    Social History   Tobacco Use  Smoking Status Former Smoker  . Packs/day: 1.00  . Years: 42.00  . Pack years: 42.00  . Types: Cigarettes  . Start date: 09/07/1971  . Quit date: 05/28/2014  . Years since quitting: 6.4  Smokeless Tobacco Never Used  Tobacco Comment   quit in November 2015 after smoking x 20 yrs.    Goals Met:  Independence with exercise equipment Exercise tolerated well No report of cardiac concerns or symptoms Strength training completed today  Goals Unmet:  Not Applicable  Comments: checkout time is 1430   Dr. Kathie Dike is Medical Director for [facility] Pulmonary  Rehab.

## 2020-11-13 ENCOUNTER — Encounter (HOSPITAL_COMMUNITY)
Admission: RE | Admit: 2020-11-13 | Discharge: 2020-11-13 | Disposition: A | Discharge status: Home / Self Care | Payer: 59 | Source: Ambulatory Visit | Attending: Pulmonary Disease | Admitting: Pulmonary Disease

## 2020-11-13 ENCOUNTER — Other Ambulatory Visit: Payer: Self-pay

## 2020-11-13 DIAGNOSIS — J849 Interstitial pulmonary disease, unspecified: Secondary | ICD-10-CM

## 2020-11-13 NOTE — Progress Notes (Signed)
Daily Session Note  Patient Details  Name: Mathew Lara MRN: [id number] Date of Birth: [birth year] Referring Provider:   Flowsheet Row PULMONARY REHAB OTHER RESP ORIENTATION from 08/07/2020 in [place]  Referring Provider Dr. Vaughan Browner       Encounter Date: 11/13/2020  Check In:  Session Check In - 11/13/20 1330      Check-In   Supervising physician immediately available to respond to emergencies [facility] MD immediately available    Physician(s) Dr. Domenic Polite    Location [facility]    Staff Present Cathren Harsh, MS, Exercise Physiologist;Dalton Kris Mouton, MS, ACSM-CEP, Exercise Physiologist;Phyllis Billingsley, RN    Virtual Visit No    Medication changes reported     No    Fall or balance concerns reported    No    Tobacco Cessation No Change    Warm-up and Cool-down Performed as group-led instruction    Resistance Training Performed Yes    VAD Patient? No    PAD/SET Patient? No      Pain Assessment   Currently in Pain? No/denies    Pain Score 5     Pain Location Back    Pain Orientation Lower    Pain Descriptors / Indicators Constant    Pain Type Chronic pain    Pain Onset More than a month ago    Pain Frequency Constant    Multiple Pain Sites No           Capillary Blood Glucose: No results found for this or any previous visit (from the past 24 hour(s)).    Social History   Tobacco Use  Smoking Status Former Smoker  . Packs/day: 1.00  . Years: 42.00  . Pack years: 42.00  . Types: Cigarettes  . Start date: 09/07/1971  . Quit date: 05/28/2014  . Years since quitting: 6.4  Smokeless Tobacco Never Used  Tobacco Comment   quit in November 2015 after smoking x 20 yrs.    Goals Met:  Independence with exercise equipment Exercise tolerated well No report of cardiac concerns or symptoms Strength training completed today  Goals Unmet:  Not Applicable  Comments: checkout time is 1430   Dr. Kathie Dike is Medical  Director for [facility] Pulmonary Rehab.

## 2020-11-13 NOTE — Progress Notes (Signed)
Pulmonary Individual Treatment Plan  Patient Details  Name: Mathew Lara MRN: [id number] Date of Birth: 1961/01/24 Referring Provider:   [facility] from 08/07/2020 in [facility]  Referring Provider Dr. Vaughan Browner       Initial Encounter Date:  Flowsheet Row PULMONARY REHAB OTHER RESP ORIENTATION from 08/07/2020 in [facility]  Date 08/07/20      Visit Diagnosis: Interstitial lung disease ([place])  Patient's Home Medications on Admission:   Current Outpatient Medications:  .  acetaminophen (TYLENOL) 325 MG tablet, Take 2 tablets (650 mg total) by mouth every 6 (six) hours as needed for mild pain or headache (fever >/= 101)., Disp: 12 tablet, Rfl: 0 .  amLODipine (NORVASC) 10 MG tablet, Take 10 mg by mouth daily., Disp: , Rfl:  .  ascorbic acid (VITAMIN C) 500 MG tablet, Take 1 tablet (500 mg total) by mouth daily., Disp: 30 tablet, Rfl: 1 .  aspirin EC 81 MG tablet, Take 81 mg by mouth daily., Disp: , Rfl:  .  DULoxetine (CYMBALTA) 30 MG capsule, Take 60 mg by mouth daily., Disp: , Rfl:  .  Fluticasone-Salmeterol (ADVAIR) 250-50 MCG/DOSE AEPB, Inhale 1 puff into the lungs 2 (two) times daily., Disp: 60 each, Rfl: 5 .  HYDROcodone-acetaminophen (NORCO/VICODIN) 5-325 MG tablet, Take 1 tablet by mouth every 6 (six) hours as needed for moderate pain., Disp: , Rfl:  .  hydrOXYzine (ATARAX/VISTARIL) 25 MG tablet, Take 25 mg by mouth 3 (three) times daily as needed for anxiety., Disp: , Rfl:  .  loratadine (CLARITIN) 10 MG tablet, Take 1 tablet (10 mg total) by mouth daily., Disp: 30 tablet, Rfl: 1 .  losartan (COZAAR) 25 MG tablet, Take 25 mg by mouth daily. , Disp: , Rfl:  .  oxyCODONE (OXY IR/ROXICODONE) 5 MG immediate release tablet, Take 5 mg by mouth every 8 (eight) hours as needed., Disp: , Rfl:  .  PROAIR HFA 108 (90 Base) MCG/ACT inhaler, Inhale 2 puffs into the lungs every 4 (four) hours as needed for  wheezing or shortness of breath., Disp: 18 g, Rfl: 3 .  sodium bicarbonate 650 MG tablet, Take 650 mg by mouth 3 (three) times daily., Disp: , Rfl:  .  zinc sulfate 220 (50 Zn) MG capsule, Take 1 capsule (220 mg total) by mouth daily., Disp: 30 capsule, Rfl: 1  Past Medical History: Past Medical History:  Diagnosis Date  . Alcohol abuse   . Anxiety   . Bronchitis   . Chronic hip pain   . CKD (chronic kidney disease) stage 3, GFR 30-59 ml/min (HCC)   . COPD (chronic obstructive pulmonary disease) ([place])   . Essential hypertension   . Hepatitis C   . Mixed hyperlipidemia   . Sleep apnea     Tobacco Use: Social History   Tobacco Use  Smoking Status Former Smoker  . Packs/day: 1.00  . Years: 42.00  . Pack years: 42.00  . Types: Cigarettes  . Start date: 09/07/1971  . Quit date: 05/28/2014  . Years since quitting: 6.4  Smokeless Tobacco Never Used  Tobacco Comment   quit in November 2015 after smoking x 20 yrs.    Labs: Recent Review Flowsheet Data    Labs for ITP Cardiac and Pulmonary Rehab Latest Ref Rng & Units 08/29/2019 09/28/2019   Trlycerides <150 mg/dL 199(H) -   PHART 7.350 - 7.450 7.393 7.343(L)    PCO2ART 32.0 - 48.0 mmHg 40.5 45.5  HCO3 20.0 - 28.0 mmol/L 24.2 24.1    ACIDBASEDEF 0.0 - 2.0 mmol/L 0.1 1.3    O2SAT % 90.7 94.1       Capillary Blood Glucose: Lab Results  Component Value Date   GLUCAP 144 (H) 03/02/2019     Pulmonary Assessment Scores:  Pulmonary Assessment Scores    Row Name 08/07/20 1257         ADL UCSD   ADL Phase Entry     SOB Score total 76           CAT Score   CAT Score 28           mMRC Score   mMRC Score 2           UCSD: Self-administered rating of dyspnea associated with activities of daily living (ADLs) 6-point scale (0 = "not at all" to 5 = "maximal or unable to do because of breathlessness")  Scoring Scores range from 0 to 120.  Minimally important difference is 5 units  CAT: CAT can identify the health  impairment of COPD patients and is better correlated with disease progression.  CAT has a scoring range of zero to 40. The CAT score is classified into four groups of low (less than 10), medium (10 - 20), high (21-30) and very high (31-40) based on the impact level of disease on health status. A CAT score over 10 suggests significant symptoms.  A worsening CAT score could be explained by an exacerbation, poor medication adherence, poor inhaler technique, or progression of COPD or comorbid conditions.  CAT MCID is 2 points  mMRC: mMRC (Modified Medical Research Council) Dyspnea Scale is used to assess the degree of baseline functional disability in patients of respiratory disease due to dyspnea. No minimal important difference is established. A decrease in score of 1 point or greater is considered a positive change.   Pulmonary Function Assessment:  Pulmonary Function Assessment - 08/07/20 1246      Breath   Shortness of Breath No           Exercise Target Goals: Exercise Program Goal: Individual exercise prescription set using results from initial 6 min walk test and THRR while considering  patient's activity barriers and safety.   Exercise Prescription Goal: Initial exercise prescription builds to 30-45 minutes a day of aerobic activity, 2-3 days per week.  Home exercise guidelines will be given to patient during program as part of exercise prescription that the participant will acknowledge.  Activity Barriers & Risk Stratification:  Activity Barriers & Cardiac Risk Stratification - 08/07/20 1244      Activity Barriers & Cardiac Risk Stratification   Activity Barriers Arthritis;Back Problems;Left Hip Replacement;Right Hip Replacement;Deconditioning    Cardiac Risk Stratification High           6 Minute Walk:  6 Minute Walk    Row Name 08/07/20 1546         6 Minute Walk   Phase Initial     Distance 900 feet     Walk Time 6 minutes     # of Rest Breaks 1     MPH 1.71      METS 2.67     RPE 10     Perceived Dyspnea  14     VO2 Peak 9.36     Symptoms Yes (comment)     Comments Stopped for ~30 sec due to desaturation to put oxygen on     Resting HR 104 bpm  Resting BP 112/70     Resting Oxygen Saturation  93 %     Exercise Oxygen Saturation  during 6 min walk 85 %     Max Ex. HR 141 bpm     Max Ex. BP 184/68     2 Minute Post BP 144/72           Interval HR   1 Minute HR 110     2 Minute HR 125     3 Minute HR 129     4 Minute HR 133     5 Minute HR 133     6 Minute HR 141     2 Minute Post HR 126     Interval Heart Rate? Yes           Interval Oxygen   Interval Oxygen? Yes     Baseline Oxygen Saturation % 93 %     1 Minute Oxygen Saturation % 85 %     1 Minute Liters of Oxygen 0 L     2 Minute Oxygen Saturation % 91 %     2 Minute Liters of Oxygen 2 L     3 Minute Oxygen Saturation % 90 %     3 Minute Liters of Oxygen 2 L     4 Minute Oxygen Saturation % 88 %     4 Minute Liters of Oxygen 2 L     5 Minute Oxygen Saturation % 88 %     5 Minute Liters of Oxygen 2 L     6 Minute Oxygen Saturation % 87 %     6 Minute Liters of Oxygen 2 L     2 Minute Post Oxygen Saturation % 97 %     2 Minute Post Liters of Oxygen 2 L            Oxygen Initial Assessment:  Oxygen Initial Assessment - 08/07/20 1545      Initial 6 min Walk   Oxygen Used Continuous    Liters per minute 2      Program Oxygen Prescription   Program Oxygen Prescription Continuous    Liters per minute 2      Intervention   Short Term Goals To learn and exhibit compliance with exercise, home and travel O2 prescription;To learn and understand importance of monitoring SPO2 with pulse oximeter and demonstrate accurate use of the pulse oximeter.;To learn and understand importance of maintaining oxygen saturations>88%;To learn and demonstrate proper pursed lip breathing techniques or other breathing techniques.;To learn and demonstrate proper use of respiratory  medications    Long  Term Goals Exhibits compliance with exercise, home and travel O2 prescription;Verbalizes importance of monitoring SPO2 with pulse oximeter and return demonstration;Maintenance of O2 saturations>88%;Exhibits proper breathing techniques, such as pursed lip breathing or other method taught during program session;Compliance with respiratory medication;Demonstrates proper use of MDI's           Oxygen Re-Evaluation:  Oxygen Re-Evaluation    Row Name 08/19/20 1451 09/16/20 1522 10/14/20 0756 11/11/20 1512       Program Oxygen Prescription   Program Oxygen Prescription Continuous Continuous Continuous Continuous    Liters per minute 2 2 2 2          Home Oxygen   Home Oxygen Device E-Tanks;Home Concentrator E-Tanks;Home Concentrator E-Tanks;Home Concentrator E-Tanks;Home Concentrator    Sleep Oxygen Prescription CPAP CPAP CPAP CPAP    Home Exercise Oxygen Prescription Continuous Continuous Continuous Continuous    Liters per  minute 2 2 2 2     Home Resting Oxygen Prescription None None None None    Compliance with Home Oxygen Use Yes Yes Yes Yes         Goals/Expected Outcomes   Short Term Goals To learn and exhibit compliance with exercise, home and travel O2 prescription;To learn and understand importance of monitoring SPO2 with pulse oximeter and demonstrate accurate use of the pulse oximeter.;To learn and understand importance of maintaining oxygen saturations>88%;To learn and demonstrate proper pursed lip breathing techniques or other breathing techniques.;To learn and demonstrate proper use of respiratory medications To learn and exhibit compliance with exercise, home and travel O2 prescription;To learn and understand importance of monitoring SPO2 with pulse oximeter and demonstrate accurate use of the pulse oximeter.;To learn and understand importance of maintaining oxygen saturations>88%;To learn and demonstrate proper pursed lip breathing techniques or other  breathing techniques.;To learn and demonstrate proper use of respiratory medications To learn and exhibit compliance with exercise, home and travel O2 prescription;To learn and understand importance of monitoring SPO2 with pulse oximeter and demonstrate accurate use of the pulse oximeter.;To learn and understand importance of maintaining oxygen saturations>88%;To learn and demonstrate proper pursed lip breathing techniques or other breathing techniques.;To learn and demonstrate proper use of respiratory medications To learn and exhibit compliance with exercise, home and travel O2 prescription;To learn and understand importance of monitoring SPO2 with pulse oximeter and demonstrate accurate use of the pulse oximeter.;To learn and understand importance of maintaining oxygen saturations>88%;To learn and demonstrate proper pursed lip breathing techniques or other breathing techniques.;To learn and demonstrate proper use of respiratory medications    Long  Term Goals Exhibits compliance with exercise, home and travel O2 prescription;Verbalizes importance of monitoring SPO2 with pulse oximeter and return demonstration;Maintenance of O2 saturations>88%;Exhibits proper breathing techniques, such as pursed lip breathing or other method taught during program session;Compliance with respiratory medication;Demonstrates proper use of MDI's Exhibits compliance with exercise, home and travel O2 prescription;Verbalizes importance of monitoring SPO2 with pulse oximeter and return demonstration;Maintenance of O2 saturations>88%;Exhibits proper breathing techniques, such as pursed lip breathing or other method taught during program session;Compliance with respiratory medication;Demonstrates proper use of MDI's Exhibits compliance with exercise, home and travel O2 prescription;Verbalizes importance of monitoring SPO2 with pulse oximeter and return demonstration;Maintenance of O2 saturations>88%;Exhibits proper breathing  techniques, such as pursed lip breathing or other method taught during program session;Compliance with respiratory medication;Demonstrates proper use of MDI's Exhibits compliance with exercise, home and travel O2 prescription;Verbalizes importance of monitoring SPO2 with pulse oximeter and return demonstration;Maintenance of O2 saturations>88%;Exhibits proper breathing techniques, such as pursed lip breathing or other method taught during program session;Compliance with respiratory medication;Demonstrates proper use of MDI's    Goals/Expected Outcomes compliance compliance compliance compliance           Oxygen Discharge (Final Oxygen Re-Evaluation):  Oxygen Re-Evaluation - 11/11/20 1512      Program Oxygen Prescription   Program Oxygen Prescription Continuous    Liters per minute 2      Home Oxygen   Home Oxygen Device E-Tanks;Home Concentrator    Sleep Oxygen Prescription CPAP    Home Exercise Oxygen Prescription Continuous    Liters per minute 2    Home Resting Oxygen Prescription None    Compliance with Home Oxygen Use Yes      Goals/Expected Outcomes   Short Term Goals To learn and exhibit compliance with exercise, home and travel O2 prescription;To learn and understand importance of monitoring SPO2 with pulse oximeter and demonstrate  accurate use of the pulse oximeter.;To learn and understand importance of maintaining oxygen saturations>88%;To learn and demonstrate proper pursed lip breathing techniques or other breathing techniques.;To learn and demonstrate proper use of respiratory medications    Long  Term Goals Exhibits compliance with exercise, home and travel O2 prescription;Verbalizes importance of monitoring SPO2 with pulse oximeter and return demonstration;Maintenance of O2 saturations>88%;Exhibits proper breathing techniques, such as pursed lip breathing or other method taught during program session;Compliance with respiratory medication;Demonstrates proper use of MDI's     Goals/Expected Outcomes compliance           Initial Exercise Prescription:  Initial Exercise Prescription - 08/07/20 1500      Date of Initial Exercise RX and Referring Provider   Date 08/07/20    Referring Provider Dr. Vaughan Browner     Expected Discharge Date 12/11/20      Oxygen   Oxygen Continuous    Liters 2      NuStep   Level 1    SPM 80    Minutes 17      Arm Ergometer   Level 1    RPM 60    Minutes 22      Prescription Details   Frequency (times per week) 2    Duration Progress to 30 minutes of continuous aerobic without signs/symptoms of physical distress      Intensity   THRR 40-80% of Max Heartrate 64-128    Ratings of Perceived Exertion 11-13    Perceived Dyspnea 0-4      Resistance Training   Training Prescription Yes    Weight 3 lbs    Reps 10-15           Perform Capillary Blood Glucose checks as needed.  Exercise Prescription Changes:   Exercise Prescription Changes    Row Name 08/19/20 1400 09/02/20 1400 09/16/20 1500 09/30/20 1600 10/02/20 1400     Response to Exercise   Blood Pressure (Admit) 118/80 128/76 128/84 146/90 -   Blood Pressure (Exercise) 152/70 150/90 134/84 130/88 -   Blood Pressure (Exit) 118/70 124/70 120/80 130/74 -   Heart Rate (Admit) 98 bpm 95 bpm 94 bpm 96 bpm -   Heart Rate (Exercise) 115 bpm 118 bpm 111 bpm 114 bpm -   Heart Rate (Exit) 105 bpm 93 bpm 102 bpm 108 bpm -   Oxygen Saturation (Admit) 93 % 96 % 97 % 97 % -   Oxygen Saturation (Exercise) 96 % 96 % 94 % 96 % -   Oxygen Saturation (Exit) 97 % 97 % 97 % 96 % -   Rating of Perceived Exertion (Exercise) 12 12 12 12  -   Perceived Dyspnea (Exercise) 6 9 9 10  -   Duration Continue with 30 min of aerobic exercise without signs/symptoms of physical distress. Continue with 30 min of aerobic exercise without signs/symptoms of physical distress. Continue with 30 min of aerobic exercise without signs/symptoms of physical distress. Continue with 30 min of aerobic  exercise without signs/symptoms of physical distress. -   Intensity THRR unchanged THRR unchanged THRR unchanged THRR unchanged -     Progression   Progression Continue to progress workloads to maintain intensity without signs/symptoms of physical distress. Continue to progress workloads to maintain intensity without signs/symptoms of physical distress. Continue to progress workloads to maintain intensity without signs/symptoms of physical distress. Continue to progress workloads to maintain intensity without signs/symptoms of physical distress. -     Resistance Training   Training Prescription Yes Yes Yes  Yes -   Weight 3 lbs 4 lbs 4 lbs 4 lbs -   Reps 10-15 10-15 10-15 10-15 -   Time - 10 Minutes 10 Minutes 10 Minutes -     Oxygen   Oxygen Continuous Continuous Continuous Continuous -   Liters 2 2 2 2  -     NuStep   Level 1 1 2 2  -   SPM 86 96 94 90 -   Minutes 22 22 17 17  -   METs 1.9 2.1 2.1 2 -     Arm Ergometer   Level 1 2 2 2  -   RPM 46 43 48 50 -   Minutes 17 17 22 22  -   METs 1.4 1.5 1.6 2 -     Home Exercise Plan   Plans to continue exercise at - - - - Home (comment)   Frequency - - - - Add 3 additional days to program exercise sessions.   Initial Home Exercises Provided - - - - 10/02/20   Row Name 10/14/20 0750 10/28/20 1400 11/11/20 1500         Response to Exercise   Blood Pressure (Admit) 124/76 132/86 122/86     Blood Pressure (Exercise) 148/72 122/68 156/92     Blood Pressure (Exit) 132/74 112/88 110/82     Heart Rate (Admit) 99 bpm 103 bpm 95 bpm     Heart Rate (Exercise) 110 bpm 122 bpm 118 bpm     Heart Rate (Exit) 106 bpm 107 bpm 103 bpm     Oxygen Saturation (Admit) 97 % 96 % 94 %     Oxygen Saturation (Exercise) 96 % 92 % 93 %     Oxygen Saturation (Exit) 96 % 96 % 94 %     Rating of Perceived Exertion (Exercise) 12 11 11      Perceived Dyspnea (Exercise) 9 11 11      Duration Continue with 30 min of aerobic exercise without signs/symptoms of  physical distress. Continue with 30 min of aerobic exercise without signs/symptoms of physical distress. Continue with 30 min of aerobic exercise without signs/symptoms of physical distress.     Intensity THRR unchanged THRR unchanged THRR unchanged           Progression   Progression Continue to progress workloads to maintain intensity without signs/symptoms of physical distress. Continue to progress workloads to maintain intensity without signs/symptoms of physical distress. Continue to progress workloads to maintain intensity without signs/symptoms of physical distress.           Resistance Training   Training Prescription Yes Yes Yes     Weight 5 lb 5 lbs 5 lbs     Reps 10-15 10-15 10-15     Time 10 Minutes 10 Minutes 10 Minutes           Oxygen   Oxygen Continuous Continuous Continuous     Liters 2 2 2            NuStep   Level 2 3 5      SPM 92 96 89     Minutes 17 17 17      METs 2.1 2 2.4           Arm Ergometer   Level 2.5 4 5      RPM 40 37 34     Minutes 22 22 22      METs 1.5 1.8 1.8            Exercise Comments:   Exercise  Comments    Row Name 10/02/20 1439           Exercise Comments Reviewed home exercise program today with patient. He is not currently walking at home so he will start to walk and do some light resistance training at home.              Exercise Goals and Review:   Exercise Goals    Row Name 08/07/20 1550 08/19/20 1454 09/16/20 1525 10/14/20 0753 11/11/20 1514     Exercise Goals   Increase Physical Activity Yes Yes Yes Yes Yes   Intervention Provide advice, education, support and counseling about physical activity/exercise needs.;Develop an individualized exercise prescription for aerobic and resistive training based on initial evaluation findings, risk stratification, comorbidities and participant's personal goals. Provide advice, education, support and counseling about physical activity/exercise needs.;Develop an individualized  exercise prescription for aerobic and resistive training based on initial evaluation findings, risk stratification, comorbidities and participant's personal goals. Provide advice, education, support and counseling about physical activity/exercise needs.;Develop an individualized exercise prescription for aerobic and resistive training based on initial evaluation findings, risk stratification, comorbidities and participant's personal goals. Provide advice, education, support and counseling about physical activity/exercise needs.;Develop an individualized exercise prescription for aerobic and resistive training based on initial evaluation findings, risk stratification, comorbidities and participant's personal goals. Provide advice, education, support and counseling about physical activity/exercise needs.;Develop an individualized exercise prescription for aerobic and resistive training based on initial evaluation findings, risk stratification, comorbidities and participant's personal goals.   Expected Outcomes Short Term: Attend rehab on a regular basis to increase amount of physical activity.;Long Term: Add in home exercise to make exercise part of routine and to increase amount of physical activity.;Long Term: Exercising regularly at least 3-5 days a week. Short Term: Attend rehab on a regular basis to increase amount of physical activity.;Long Term: Add in home exercise to make exercise part of routine and to increase amount of physical activity.;Long Term: Exercising regularly at least 3-5 days a week. Short Term: Attend rehab on a regular basis to increase amount of physical activity.;Long Term: Add in home exercise to make exercise part of routine and to increase amount of physical activity.;Long Term: Exercising regularly at least 3-5 days a week. Short Term: Attend rehab on a regular basis to increase amount of physical activity.;Long Term: Add in home exercise to make exercise part of routine and to increase  amount of physical activity.;Long Term: Exercising regularly at least 3-5 days a week. Short Term: Attend rehab on a regular basis to increase amount of physical activity.;Long Term: Add in home exercise to make exercise part of routine and to increase amount of physical activity.;Long Term: Exercising regularly at least 3-5 days a week.   Increase Strength and Stamina Yes Yes Yes Yes Yes   Intervention Provide advice, education, support and counseling about physical activity/exercise needs.;Develop an individualized exercise prescription for aerobic and resistive training based on initial evaluation findings, risk stratification, comorbidities and participant's personal goals. Provide advice, education, support and counseling about physical activity/exercise needs.;Develop an individualized exercise prescription for aerobic and resistive training based on initial evaluation findings, risk stratification, comorbidities and participant's personal goals. Provide advice, education, support and counseling about physical activity/exercise needs.;Develop an individualized exercise prescription for aerobic and resistive training based on initial evaluation findings, risk stratification, comorbidities and participant's personal goals. Provide advice, education, support and counseling about physical activity/exercise needs.;Develop an individualized exercise prescription for aerobic and resistive training based on initial evaluation findings,  risk stratification, comorbidities and participant's personal goals. Provide advice, education, support and counseling about physical activity/exercise needs.;Develop an individualized exercise prescription for aerobic and resistive training based on initial evaluation findings, risk stratification, comorbidities and participant's personal goals.   Expected Outcomes Short Term: Increase workloads from initial exercise prescription for resistance, speed, and METs.;Short Term: Perform  resistance training exercises routinely during rehab and add in resistance training at home;Long Term: Improve cardiorespiratory fitness, muscular endurance and strength as measured by increased METs and functional capacity (6MWT) Short Term: Increase workloads from initial exercise prescription for resistance, speed, and METs.;Short Term: Perform resistance training exercises routinely during rehab and add in resistance training at home;Long Term: Improve cardiorespiratory fitness, muscular endurance and strength as measured by increased METs and functional capacity (6MWT) Short Term: Increase workloads from initial exercise prescription for resistance, speed, and METs.;Short Term: Perform resistance training exercises routinely during rehab and add in resistance training at home;Long Term: Improve cardiorespiratory fitness, muscular endurance and strength as measured by increased METs and functional capacity (6MWT) Short Term: Increase workloads from initial exercise prescription for resistance, speed, and METs.;Short Term: Perform resistance training exercises routinely during rehab and add in resistance training at home;Long Term: Improve cardiorespiratory fitness, muscular endurance and strength as measured by increased METs and functional capacity (6MWT) Short Term: Increase workloads from initial exercise prescription for resistance, speed, and METs.;Short Term: Perform resistance training exercises routinely during rehab and add in resistance training at home;Long Term: Improve cardiorespiratory fitness, muscular endurance and strength as measured by increased METs and functional capacity (6MWT)   Able to understand and use rate of perceived exertion (RPE) scale Yes Yes Yes Yes Yes   Intervention Provide education and explanation on how to use RPE scale Provide education and explanation on how to use RPE scale Provide education and explanation on how to use RPE scale Provide education and explanation on  how to use RPE scale Provide education and explanation on how to use RPE scale   Expected Outcomes Short Term: Able to use RPE daily in rehab to express subjective intensity level;Long Term:  Able to use RPE to guide intensity level when exercising independently Short Term: Able to use RPE daily in rehab to express subjective intensity level;Long Term:  Able to use RPE to guide intensity level when exercising independently Short Term: Able to use RPE daily in rehab to express subjective intensity level;Long Term:  Able to use RPE to guide intensity level when exercising independently Short Term: Able to use RPE daily in rehab to express subjective intensity level;Long Term:  Able to use RPE to guide intensity level when exercising independently Short Term: Able to use RPE daily in rehab to express subjective intensity level;Long Term:  Able to use RPE to guide intensity level when exercising independently   Able to understand and use Dyspnea scale Yes Yes Yes Yes Yes   Intervention Provide education and explanation on how to use Dyspnea scale Provide education and explanation on how to use Dyspnea scale Provide education and explanation on how to use Dyspnea scale Provide education and explanation on how to use Dyspnea scale Provide education and explanation on how to use Dyspnea scale   Expected Outcomes Short Term: Able to use Dyspnea scale daily in rehab to express subjective sense of shortness of breath during exertion;Long Term: Able to use Dyspnea scale to guide intensity level when exercising independently Short Term: Able to use Dyspnea scale daily in rehab to express subjective sense of shortness of  breath during exertion;Long Term: Able to use Dyspnea scale to guide intensity level when exercising independently Short Term: Able to use Dyspnea scale daily in rehab to express subjective sense of shortness of breath during exertion;Long Term: Able to use Dyspnea scale to guide intensity level when  exercising independently Short Term: Able to use Dyspnea scale daily in rehab to express subjective sense of shortness of breath during exertion;Long Term: Able to use Dyspnea scale to guide intensity level when exercising independently Short Term: Able to use Dyspnea scale daily in rehab to express subjective sense of shortness of breath during exertion;Long Term: Able to use Dyspnea scale to guide intensity level when exercising independently   Knowledge and understanding of Target Heart Rate Range (THRR) Yes Yes Yes Yes Yes   Intervention Provide education and explanation of THRR including how the numbers were predicted and where they are located for reference Provide education and explanation of THRR including how the numbers were predicted and where they are located for reference Provide education and explanation of THRR including how the numbers were predicted and where they are located for reference Provide education and explanation of THRR including how the numbers were predicted and where they are located for reference -   Expected Outcomes Short Term: Able to state/look up THRR;Long Term: Able to use THRR to govern intensity when exercising independently;Short Term: Able to use daily as guideline for intensity in rehab Short Term: Able to state/look up THRR;Long Term: Able to use THRR to govern intensity when exercising independently;Short Term: Able to use daily as guideline for intensity in rehab Short Term: Able to state/look up THRR;Long Term: Able to use THRR to govern intensity when exercising independently;Short Term: Able to use daily as guideline for intensity in rehab Short Term: Able to state/look up THRR;Long Term: Able to use THRR to govern intensity when exercising independently;Short Term: Able to use daily as guideline for intensity in rehab Short Term: Able to state/look up THRR;Long Term: Able to use THRR to govern intensity when exercising independently;Short Term: Able to use daily  as guideline for intensity in rehab   Understanding of Exercise Prescription Yes Yes Yes Yes Yes   Intervention Provide education, explanation, and written materials on patient's individual exercise prescription Provide education, explanation, and written materials on patient's individual exercise prescription Provide education, explanation, and written materials on patient's individual exercise prescription Provide education, explanation, and written materials on patient's individual exercise prescription Provide education, explanation, and written materials on patient's individual exercise prescription   Expected Outcomes Short Term: Able to explain program exercise prescription;Long Term: Able to explain home exercise prescription to exercise independently Short Term: Able to explain program exercise prescription;Long Term: Able to explain home exercise prescription to exercise independently Short Term: Able to explain program exercise prescription;Long Term: Able to explain home exercise prescription to exercise independently Short Term: Able to explain program exercise prescription;Long Term: Able to explain home exercise prescription to exercise independently Short Term: Able to explain program exercise prescription;Long Term: Able to explain home exercise prescription to exercise independently          Exercise Goals Re-Evaluation :  Exercise Goals Re-Evaluation    Row Name 08/19/20 1455 09/16/20 1525 10/14/20 0754 11/11/20 1514       Exercise Goal Re-Evaluation   Exercise Goals Review Increase Physical Activity;Increase Strength and Stamina;Able to understand and use rate of perceived exertion (RPE) scale;Able to understand and use Dyspnea scale;Knowledge and understanding of Target Heart Rate Range (THRR);Understanding of  Exercise Prescription Increase Physical Activity;Increase Strength and Stamina;Able to understand and use rate of perceived exertion (RPE) scale;Able to understand and use  Dyspnea scale;Knowledge and understanding of Target Heart Rate Range (THRR);Understanding of Exercise Prescription Increase Physical Activity;Increase Strength and Stamina;Able to understand and use rate of perceived exertion (RPE) scale;Able to understand and use Dyspnea scale;Knowledge and understanding of Target Heart Rate Range (THRR);Understanding of Exercise Prescription Increase Physical Activity;Increase Strength and Stamina;Able to understand and use rate of perceived exertion (RPE) scale;Able to understand and use Dyspnea scale;Knowledge and understanding of Target Heart Rate Range (THRR);Understanding of Exercise Prescription    Comments Pt has attended 3 exercise sessions. He maintains a positive attitude and gives good effort. He is currently exercising at 1.9 METs on the stepper. Will continue to monitor and progress as able. Pt has attended 11 exercise sessions. He reports that he feels like he is getting stronger and is able to do his ADL's with less shortness of breath. He currently exercises at 2.1 METs on the stepper. Will continue to monitor and progress as able. Patient has completed 18 exercise sessions. He is progressing exercise well. He has started exercising at home regularly and reports that he feels it is helping him progress. He is getting stronger and is increasing his intensities on both aerobic training and resistance training. He is currently exercising at 2.1 METs on the NuStep. Will continue to monitor and progress as able. Patient has comleted 26 pulmonary rehab sessions. He is progressing well and continues to increase his workloads. He reports that he feels much stronger now and is continuing to exercise at home as well. He currently is exercising at 2.4 METs on the stepper. Will continue to monitor and progress as able.    Expected Outcomes Through exercise at rehab and by engaging in a home exercise program, the patient will meet their stated goals. Through exercise at rehab  and by engaging in a home exercise program, the patient will meet their stated goals. Through exercise at rehab and by engaging in a home exercise program, the patient will meet their stated goals. Through exercise at rehab and by engaging in a home exercise program, the patient will meet their stated goals.           Discharge Exercise Prescription (Final Exercise Prescription Changes):  Exercise Prescription Changes - 11/11/20 1500      Response to Exercise   Blood Pressure (Admit) 122/86    Blood Pressure (Exercise) 156/92    Blood Pressure (Exit) 110/82    Heart Rate (Admit) 95 bpm    Heart Rate (Exercise) 118 bpm    Heart Rate (Exit) 103 bpm    Oxygen Saturation (Admit) 94 %    Oxygen Saturation (Exercise) 93 %    Oxygen Saturation (Exit) 94 %    Rating of Perceived Exertion (Exercise) 11    Perceived Dyspnea (Exercise) 11    Duration Continue with 30 min of aerobic exercise without signs/symptoms of physical distress.    Intensity THRR unchanged      Progression   Progression Continue to progress workloads to maintain intensity without signs/symptoms of physical distress.      Resistance Training   Training Prescription Yes    Weight 5 lbs    Reps 10-15    Time 10 Minutes      Oxygen   Oxygen Continuous    Liters 2      NuStep   Level 5    SPM 89  Minutes 17    METs 2.4      Arm Ergometer   Level 5    RPM 34    Minutes 22    METs 1.8           Nutrition:  Target Goals: Understanding of nutrition guidelines, daily intake of sodium 1500mg , cholesterol 200mg , calories 30% from fat and 7% or less from saturated fats, daily to have 5 or more servings of fruits and vegetables.  Biometrics:  Pre Biometrics - 10/28/20 1437      Pre Biometrics   Weight 135.4 kg            Nutrition Therapy Plan and Nutrition Goals:  Nutrition Therapy & Goals - 11/03/20 1428      Personal Nutrition Goals   Comments We continue to provide nutritional education  through handouts.      Intervention Plan   Intervention Nutrition handout(s) given to patient.           Nutrition Assessments:  Nutrition Assessments - 08/07/20 1305      MEDFICTS Scores   Pre Score 15          MEDIFICTS Score Key:  ?70 Need to make dietary changes   40-70 Heart Healthy Diet  ? 40 Therapeutic Level Cholesterol Diet   Picture Your Plate Scores:  <56 Unhealthy dietary pattern with much room for improvement.  41-50 Dietary pattern unlikely to meet recommendations for good health and room for improvement.  51-60 More healthful dietary pattern, with some room for improvement.   >60 Healthy dietary pattern, although there may be some specific behaviors that could be improved.    Nutrition Goals Re-Evaluation:   Nutrition Goals Discharge (Final Nutrition Goals Re-Evaluation):   Psychosocial: Target Goals: Acknowledge presence or absence of significant depression and/or stress, maximize coping skills, provide positive support system. Participant is able to verbalize types and ability to use techniques and skills needed for reducing stress and depression.  Initial Review & Psychosocial Screening:  Initial Psych Review & Screening - 08/07/20 1249      Initial Review   Current issues with None Identified      Family Dynamics   Good Support System? Yes    Comments His daughter is his support system. She lives nearby. He sees her frequently and they call each other frequently.      Barriers   Psychosocial barriers to participate in program There are no identifiable barriers or psychosocial needs.      Screening Interventions   Interventions Encouraged to exercise    Expected Outcomes Long Term Goal: Stressors or current issues are controlled or eliminated.;Short Term goal: Identification and review with participant of any Quality of Life or Depression concerns found by scoring the questionnaire.           Quality of Life Scores:  Quality of  Life - 08/07/20 1544      Quality of Life   Select Quality of Life      Quality of Life Scores   Health/Function Pre 11.14 %    Socioeconomic Pre 13.5 %    Psych/Spiritual Pre 18.86 %    Family Pre 24 %    GLOBAL Pre 15.18 %          Scores of 19 and below usually indicate a poorer quality of life in these areas.  A difference of  2-3 points is a clinically meaningful difference.  A difference of 2-3 points in the total score of the Quality  of Life Index has been associated with significant improvement in overall quality of life, self-image, physical symptoms, and general health in studies assessing change in quality of life.   PHQ-9: Recent Review Flowsheet Data    Depression screen [employer] 2/9 08/07/2020   Decreased Interest 1   Down, Depressed, Hopeless 1   PHQ - 2 Score 2   Altered sleeping 0   Tired, decreased energy 3   Change in appetite 0   Feeling bad or failure about yourself  1   Trouble concentrating 0   Moving slowly or fidgety/restless 1   Suicidal thoughts 0   PHQ-9 Score 7   Difficult doing work/chores Not difficult at all     Interpretation of Total Score  Total Score Depression Severity:  1-4 = Minimal depression, 5-9 = Mild depression, 10-14 = Moderate depression, 15-19 = Moderately severe depression, 20-27 = Severe depression   Psychosocial Evaluation and Intervention:   Psychosocial Re-Evaluation:  Psychosocial Re-Evaluation    [place] Name 08/18/20 0920 09/12/20 0953 10/03/20 1157 11/03/20 1426       Psychosocial Re-Evaluation   Current issues with None Identified None Identified None Identified None Identified    Comments Patient's initial QOL score was 15.18% overall and his PHQ-9 score was 7. He has been on cymbalta 30 mg BID but reproted that he was not taking during pharmacy review at his orientation visit. He is new to the program completing 2 sessions. Will continue to moniotr. Patient continues to have no psychosocial issues identified. He has  completed 10 sessions with consistent attendance. He demonstrates a very positive attitude and works hard during the sessions. Will continue to monitor. Patient continues to have no psychosocial issues identified. He has completed 15 sessions with consistent attendance. He demonstrates a very positive attitude and works hard during the sessions. Will continue to monitor. Patient continues to have no psychosocial issues identified. He has completed 23 sessions with consistent attendance. He demonstrates a very positive attitude and works hard during the sessions. Will continue to monitor.    Expected Outcomes Patient will have no psychosocial issues identified at discharge. Patient will have no psychosocial issues identified at discharge. Patient will have no psychosocial issues identified at discharge. Patient will have no psychosocial issues identified at discharge.    Interventions Encouraged to attend Pulmonary Rehabilitation for the exercise;Relaxation education;Stress management education Encouraged to attend Pulmonary Rehabilitation for the exercise;Relaxation education;Stress management education Encouraged to attend Pulmonary Rehabilitation for the exercise;Relaxation education;Stress management education Encouraged to attend Cardiac Rehabilitation for the exercise;Relaxation education;Stress management education    Continue Psychosocial Services  No Follow up required No Follow up required No Follow up required No Follow up required           Psychosocial Discharge (Final Psychosocial Re-Evaluation):  Psychosocial Re-Evaluation - 11/03/20 1426      Psychosocial Re-Evaluation   Current issues with None Identified    Comments Patient continues to have no psychosocial issues identified. He has completed 23 sessions with consistent attendance. He demonstrates a very positive attitude and works hard during the sessions. Will continue to monitor.    Expected Outcomes Patient will have no  psychosocial issues identified at discharge.    Interventions Encouraged to attend Cardiac Rehabilitation for the exercise;Relaxation education;Stress management education    Continue Psychosocial Services  No Follow up required            Education: Education Goals: Education classes will be provided on a weekly basis, covering required topics.  Participant will state understanding/return demonstration of topics presented.  Learning Barriers/Preferences:  Learning Barriers/Preferences - 08/07/20 1246      Learning Barriers/Preferences   Learning Barriers None    Learning Preferences Skilled Demonstration;Individual Instruction           Education Topics: How Lungs Work and Diseases: - Discuss the anatomy of the lungs and diseases that can affect the lungs, such as COPD.   Exercise: -Discuss the importance of exercise, FITT principles of exercise, normal and abnormal responses to exercise, and how to exercise safely.   Environmental Irritants: -Discuss types of environmental irritants and how to limit exposure to environmental irritants. Flowsheet Row PULMONARY REHAB OTHER RESPIRATORY from 11/06/2020 in [place]  Date 09/18/20  Educator DF  Instruction Review Code 2- Demonstrated Understanding      Meds/Inhalers and oxygen: - Discuss respiratory medications, definition of an inhaler and oxygen, and the proper way to use an inhaler and oxygen. Flowsheet Row PULMONARY REHAB OTHER RESPIRATORY from 11/06/2020 in [place]  Date 09/25/20  Educator Handout      Energy Saving Techniques: - Discuss methods to conserve energy and decrease shortness of breath when performing activities of daily living.  Flowsheet Row PULMONARY REHAB OTHER RESPIRATORY from 11/06/2020 in [place]  Date 10/02/20  Educator Hand out  Instruction Review Code 1- Verbalizes Understanding      Bronchial Hygiene / Breathing  Techniques: - Discuss breathing mechanics, pursed-lip breathing technique,  proper posture, effective ways to clear airways, and other functional breathing techniques Flowsheet Row PULMONARY REHAB OTHER RESPIRATORY from 11/06/2020 in [place]  Date 10/09/20  Educator pb  Instruction Review Code 1- Verbalizes Understanding      Cleaning Equipment: - Provides group verbal and written instruction about the health risks of elevated stress, cause of high stress, and healthy ways to reduce stress. Flowsheet Row PULMONARY REHAB OTHER RESPIRATORY from 11/06/2020 in [place]  Date 10/16/20  Educator Handout  Instruction Review Code 1- Verbalizes Understanding      Nutrition I: Fats: - Discuss the types of cholesterol, what cholesterol does to the body, and how cholesterol levels can be controlled. Flowsheet Row PULMONARY REHAB OTHER RESPIRATORY from 11/06/2020 in [place]  Date 10/23/20  Educator Handout  Instruction Review Code 1- Verbalizes Understanding      Nutrition II: Labels: -Discuss the different components of food labels and how to read food labels. Flowsheet Row PULMONARY REHAB OTHER RESPIRATORY from 11/06/2020 in [place]  Date 10/30/20  Educator DF  Instruction Review Code 2- Demonstrated Understanding      Respiratory Infections: - Discuss the signs and symptoms of respiratory infections, ways to prevent respiratory infections, and the importance of seeking medical treatment when having a respiratory infection. Flowsheet Row PULMONARY REHAB OTHER RESPIRATORY from 11/06/2020 in [place]  Date 11/06/20  Educator DF  Instruction Review Code 2- Demonstrated Understanding      Stress I: Signs and Symptoms: - Discuss the causes of stress, how stress may lead to anxiety and depression, and ways to limit stress. Flowsheet Row PULMONARY REHAB OTHER RESPIRATORY  from 11/06/2020 in [place]  Date 08/14/20  Educator [facility]  Instruction Review Code 1- Verbalizes Understanding      Stress II: Relaxation: -Discuss relaxation techniques to limit stress. Flowsheet Row PULMONARY REHAB OTHER RESPIRATORY from 11/06/2020 in [place]  Date 08/21/20  Educator [facility]  Instruction Review Code 2- Demonstrated Understanding      Oxygen for Home/Travel: - Discuss how to prepare for travel when on oxygen and proper ways to transport and store oxygen to ensure safety. Flowsheet Row PULMONARY REHAB OTHER RESPIRATORY from 11/06/2020 in [place]  Date 08/28/20  Educator DJ  Instruction Review Code 1- Verbalizes Understanding      Knowledge Questionnaire Score:  Knowledge Questionnaire Score - 08/07/20 1258      Knowledge Questionnaire Score   Pre Score 5/18           Core Components/Risk Factors/Patient Goals at Admission:  Personal Goals and Risk Factors at Admission - 08/07/20 1247      Core Components/Risk Factors/Patient Goals on Admission    Weight Management Yes;Obesity    Intervention Weight Management: Develop a combined nutrition and exercise program designed to reach desired caloric intake, while maintaining appropriate intake of nutrient and fiber, sodium and fats, and appropriate energy expenditure required for the weight goal.;Weight Management: Provide education and appropriate resources to help participant work on and attain dietary goals.;Weight Management/Obesity: Establish reasonable short term and long term weight goals.;Obesity: Provide education and appropriate resources to help participant work on and attain dietary goals.    Admit Weight 294 lb (133.4 kg)    Goal Weight: Short Term 274 lb (124.3 kg)    Goal Weight: Long Term 250 lb (113.4 kg)    Expected Outcomes Short Term: Continue to assess and modify interventions until short term weight is achieved;Long Term:  Adherence to nutrition and physical activity/exercise program aimed toward attainment of established weight goal;Weight Maintenance: Understanding of the daily nutrition guidelines, which includes 25-35% calories from fat, 7% or less cal from saturated fats, less than 200mg  cholesterol, less than 1.5gm of sodium, & 5 or more servings of fruits and vegetables daily;Weight Loss: Understanding of general recommendations for a balanced deficit meal plan, which promotes 1-2 lb weight loss per week and includes a negative energy balance of [phone] kcal/d;Understanding recommendations for meals to include 15-35% energy as protein, 25-35% energy from fat, 35-60% energy from carbohydrates, less than 200mg  of dietary cholesterol, 20-35 gm of total fiber daily;Understanding of distribution of calorie intake throughout the day with the consumption of 4-5 meals/snacks;Weight Gain: Understanding of general recommendations for a high calorie, high protein meal plan that promotes weight gain by distributing calorie intake throughout the day with the consumption for 4-5 meals, snacks, and/or supplements    Improve shortness of breath with ADL's Yes    Intervention Provide education, individualized exercise plan and daily activity instruction to help decrease symptoms of SOB with activities of daily living.    Expected Outcomes Short Term: Improve cardiorespiratory fitness to achieve a reduction of symptoms when performing ADLs;Long Term: Be able to perform more ADLs without symptoms or delay the onset of symptoms           Core Components/Risk Factors/Patient Goals Review:   Goals and Risk Factor Review    Row Name 08/18/20 7253 09/12/20 0954 10/03/20 1206 11/03/20 1429       Core Components/Risk Factors/Patient Goals Review   Personal Goals Review Improve shortness of breath with ADL's;Weight Management/Obesity Improve shortness of breath with ADL's;Weight Management/Obesity Improve shortness of breath with  ADL's;Weight Management/Obesity Improve shortness of breath with ADL's;Weight Management/Obesity    Review Patient is new to the program completing 2 sessions. He was referred to pulmonary rehab for increased SOB secondary to ILD. His personal goals are to return to  his ADL's and increase his energy and strength. Will continue to monitor for progress. Patient has completed 10 sessions maintaining his weight since his initial visit. He is doing very well with progression. He saw his pulmonologist 09/19/20 for a routine follow up. He changed his [place]. His personal goals are to return to his ADL's; increase his strength and energy. We will continue to monitor as patient works toward meeting these goals. Patient has completed 15 sessions and also noted some weight gain since his initial visit 3#. He is doing very well with SOB and continues to work hard in [place].  His personal goals are to return to his ADL's; increase his strength and energy. We will continue to monitor as patient works toward meeting these goals. Patient has completed 23 sessions and also noted some weight gain since his initial visit 3#. He is doing very well with SOB and continues to work hard in [place].  His personal goals are to return to his ADL's; increase his strength and energy. We will continue to monitor as patient works toward meeting these goals.    Expected Outcomes Patient will complete the program meeting both personal and program goals. Patient will complete the program meeting both personal and program goals. Patient will complete the program meeting both personal and program goals. Patient will complete the program meeting both personal and program goals.           Core Components/Risk Factors/Patient Goals at Discharge (Final Review):   Goals and Risk Factor Review - 11/03/20 1429      Core Components/Risk Factors/Patient Goals Review   Personal Goals Review Improve shortness of breath with  ADL's;Weight Management/Obesity    Review Patient has completed 23 sessions and also noted some weight gain since his initial visit 3#. He is doing very well with SOB and continues to work hard in [place].  His personal goals are to return to his ADL's; increase his strength and energy. We will continue to monitor as patient works toward meeting these goals.    Expected Outcomes Patient will complete the program meeting both personal and program goals.           ITP Comments:   Comments: ITP REVIEW Pt is making expected progress toward pulmonary rehab goals after completing 23 sessions. Recommend continued exercise, life style modification, education, and utilization of breathing techniques to increase stamina and strength and decrease shortness of breath with exertion.

## 2020-11-18 ENCOUNTER — Other Ambulatory Visit: Payer: Self-pay

## 2020-11-18 ENCOUNTER — Encounter (HOSPITAL_COMMUNITY)
Admission: RE | Admit: 2020-11-18 | Discharge: 2020-11-18 | Disposition: A | Discharge status: Home / Self Care | Payer: Self-pay | Source: Ambulatory Visit | Attending: Pulmonary Disease | Admitting: Pulmonary Disease

## 2020-11-18 DIAGNOSIS — J849 Interstitial pulmonary disease, unspecified: Secondary | ICD-10-CM

## 2020-11-18 NOTE — Progress Notes (Signed)
Daily Session Note  Patient Details  Name: EDWING FIGLEY MRN: [id number] Date of Birth: [birth year] Referring Provider:   Flowsheet Row PULMONARY REHAB OTHER RESP ORIENTATION from 08/07/2020 in [place]  Referring Provider Dr. Vaughan Browner       Encounter Date: 11/18/2020  Check In:  Session Check In - 11/18/20 1330      Check-In   Supervising physician immediately available to respond to emergencies [facility] MD immediately available    Physician(s) Dr. Harrington Challenger    Location [facility]    Staff Present Cathren Harsh, MS, Exercise Physiologist;Phyllis Billingsley, RN    Virtual Visit No    Medication changes reported     No    Fall or balance concerns reported    No    Tobacco Cessation No Change    Warm-up and Cool-down Performed as group-led instruction    Resistance Training Performed Yes    VAD Patient? No    PAD/SET Patient? No      Pain Assessment   Currently in Pain? Yes    Pain Score 5     Pain Location Back    Pain Orientation Lower    Pain Descriptors / Indicators Constant    Pain Type Chronic pain    Multiple Pain Sites No           Capillary Blood Glucose: No results found for this or any previous visit (from the past 24 hour(s)).    Social History   Tobacco Use  Smoking Status Former Smoker  . Packs/day: 1.00  . Years: 42.00  . Pack years: 42.00  . Types: Cigarettes  . Start date: 09/07/1971  . Quit date: 05/28/2014  . Years since quitting: 6.4  Smokeless Tobacco Never Used  Tobacco Comment   quit in November 2015 after smoking x 20 yrs.    Goals Met:  Independence with exercise equipment Exercise tolerated well No report of cardiac concerns or symptoms Strength training completed today  Goals Unmet:  Not Applicable  Comments: check out 1430   Dr. Kathie Dike is Medical Director for [facility] Pulmonary Rehab.

## 2020-11-20 ENCOUNTER — Other Ambulatory Visit: Payer: Self-pay

## 2020-11-20 ENCOUNTER — Encounter (HOSPITAL_COMMUNITY)
Admission: RE | Admit: 2020-11-20 | Discharge: 2020-11-20 | Disposition: A | Discharge status: Home / Self Care | Payer: 59 | Source: Ambulatory Visit | Attending: Pulmonary Disease | Admitting: Pulmonary Disease

## 2020-11-20 DIAGNOSIS — J849 Interstitial pulmonary disease, unspecified: Secondary | ICD-10-CM

## 2020-11-20 NOTE — Progress Notes (Signed)
Daily Session Note  Patient Details  Name: Mathew Lara MRN: [id number] Date of Birth: 15-Sep-1960 Referring Provider:   Flowsheet Row PULMONARY REHAB OTHER RESP ORIENTATION from 08/07/2020 in [place]  Referring Provider Dr. Vaughan Browner       Encounter Date: 11/20/2020  Check In:  Session Check In - 11/20/20 1330      Check-In   Supervising physician immediately available to respond to emergencies [facility] immediately available    Physician(s) Dr. Domenic Polite    [facility]    Staff Present Geanie Cooley, RN;Madison Audria Nine, MS, Exercise Physiologist    Virtual Visit No    Medication changes reported     No    Fall or balance concerns reported    No    Tobacco Cessation No Change    Warm-up and Cool-down Performed as group-led instruction    Resistance Training Performed Yes    VAD Patient? No    PAD/SET Patient? No      Pain Assessment   Currently in Pain? Yes    Pain Score 5     Pain Location Back    Pain Orientation Lower    Pain Descriptors / Indicators Constant    Pain Type Chronic pain    Pain Onset More than a month ago    Pain Frequency Constant    Multiple Pain Sites No           Capillary Blood Glucose: No results found for this or any previous visit (from the past 24 hour(s)).    Social History   Tobacco Use  Smoking Status Former Smoker  . Packs/day: 1.00  . Years: 42.00  . Pack years: 42.00  . Types: Cigarettes  . Start date: 09/07/1971  . Quit date: 05/28/2014  . Years since quitting: 6.4  Smokeless Tobacco Never Used  Tobacco Comment   quit in November 2015 after smoking x 20 yrs.    Goals Met:  Proper associated with RPD/PD & O2 Sat Independence with exercise equipment Exercise tolerated well No report of cardiac concerns or symptoms Strength training completed today  Goals Unmet:  Not Applicable  Comments: check out @ 14:30   Dr. Kathie Dike is Medical Director for [facility]  Pulmonary Rehab.

## 2020-11-25 ENCOUNTER — Other Ambulatory Visit: Payer: Self-pay

## 2020-11-25 ENCOUNTER — Encounter (HOSPITAL_COMMUNITY)
Admission: RE | Admit: 2020-11-25 | Discharge: 2020-11-25 | Disposition: A | Discharge status: Home / Self Care | Payer: 59 | Source: Ambulatory Visit | Attending: Pulmonary Disease | Admitting: Pulmonary Disease

## 2020-11-25 VITALS — Wt 300.7 lb

## 2020-11-25 DIAGNOSIS — J849 Interstitial pulmonary disease, unspecified: Secondary | ICD-10-CM

## 2020-11-25 NOTE — Progress Notes (Signed)
Daily Session Note  Patient Details  Name: Mathew Lara MRN: [id number] Date of Birth: 1961-06-21 Referring Provider:   Flowsheet Row PULMONARY REHAB OTHER RESP ORIENTATION from 08/07/2020 in [place]  Referring Provider Dr. Vaughan Browner       Encounter Date: 11/25/2020  Check In:  Session Check In - 11/25/20 1330      Check-In   Supervising physician immediately available to respond to emergencies [facility] immediately available    Physician(s) Dr. Harl Bowie    Location [facility]    Staff Present Geanie Cooley, RN;Madison Audria Nine, MS, Exercise Physiologist;Dalton Kris Mouton, MS, ACSM-CEP, Exercise Physiologist    Virtual Visit No    Medication changes reported     No    Fall or balance concerns reported    No    Tobacco Cessation No Change    Warm-up and Cool-down Performed as group-led instruction    Resistance Training Performed Yes    VAD Patient? No    PAD/SET Patient? No      Pain Assessment   Currently in Pain? Yes    Pain Score 5     Pain Location Back    Pain Orientation Lower    Pain Descriptors / Indicators Constant    Pain Type Chronic pain    Pain Onset More than a month ago    Pain Frequency Constant    Multiple Pain Sites No           Capillary Blood Glucose: No results found for this or any previous visit (from the past 24 hour(s)).    Social History   Tobacco Use  Smoking Status Former Smoker  . Packs/day: 1.00  . Years: 42.00  . Pack years: 42.00  . Types: Cigarettes  . Start date: 09/07/1971  . Quit date: 05/28/2014  . Years since quitting: 6.5  Smokeless Tobacco Never Used  Tobacco Comment   quit in November 2015 after smoking x 20 yrs.    Goals Met:  Independence with exercise equipment Exercise tolerated well No report of cardiac concerns or symptoms Strength training completed today  Goals Unmet:  Not Applicable  Comments: checkout time is 1430   Dr. Kathie Dike is Medical Director  for [facility] Pulmonary Rehab.

## 2020-11-27 ENCOUNTER — Encounter (HOSPITAL_COMMUNITY)
Admission: RE | Admit: 2020-11-27 | Discharge: 2020-11-27 | Disposition: A | Discharge status: Home / Self Care | Payer: 59 | Source: Ambulatory Visit | Attending: Pulmonary Disease | Admitting: Pulmonary Disease

## 2020-11-27 ENCOUNTER — Other Ambulatory Visit: Payer: Self-pay

## 2020-11-27 DIAGNOSIS — J849 Interstitial pulmonary disease, unspecified: Secondary | ICD-10-CM

## 2020-11-27 NOTE — Progress Notes (Signed)
Daily Session Note  Patient Details  Name: Mathew Lara MRN: [id number] Date of Birth: 02/06/1961 Referring Provider:   Flowsheet Row PULMONARY REHAB OTHER RESP ORIENTATION from 08/07/2020 in [place]  Referring Provider Dr. Vaughan Browner       Encounter Date: 11/27/2020  Check In:  Session Check In - 11/27/20 1330      Check-In   Supervising physician immediately available to respond to emergencies [facility] immediately available    Physician(s) Dr. Harl Bowie    Location [facility]    Staff Present Hoy Register, MS, ACSM-CEP, Exercise Physiologist;Wessley Emert Wynetta Emery, RN, BSN;Madison Audria Nine, MS, Exercise Physiologist;Phyllis Billingsley, RN    Virtual Visit No    Medication changes reported     No    Fall or balance concerns reported    No    Tobacco Cessation No Change    Warm-up and Cool-down Performed as group-led instruction    Resistance Training Performed Yes    VAD Patient? No    PAD/SET Patient? No      Pain Assessment   Currently in Pain? Yes    Pain Score 5     Pain Location Back    Pain Orientation Lower    Pain Descriptors / Indicators Constant    Pain Type Chronic pain    Pain Onset More than a month ago    Pain Frequency Constant    Multiple Pain Sites No           Capillary Blood Glucose: No results found for this or any previous visit (from the past 24 hour(s)).    Social History   Tobacco Use  Smoking Status Former Smoker  . Packs/day: 1.00  . Years: 42.00  . Pack years: 42.00  . Types: Cigarettes  . Start date: 09/07/1971  . Quit date: 05/28/2014  . Years since quitting: 6.5  Smokeless Tobacco Never Used  Tobacco Comment   quit in November 2015 after smoking x 20 yrs.    Goals Met:  Proper associated with RPD/PD & O2 Sat Independence with exercise equipment Improved SOB with ADL's Using PLB without cueing & demonstrates good technique Exercise tolerated well No report of cardiac concerns or  symptoms Strength training completed today  Goals Unmet:  Not Applicable  Comments: Check out 1430.   Dr. Kathie Dike is Medical Director for [facility] Pulmonary Rehab.

## 2020-12-02 ENCOUNTER — Other Ambulatory Visit: Payer: Self-pay

## 2020-12-02 ENCOUNTER — Encounter (HOSPITAL_COMMUNITY)
Admission: RE | Admit: 2020-12-02 | Discharge: 2020-12-02 | Disposition: A | Discharge status: Home / Self Care | Payer: Self-pay | Source: Ambulatory Visit | Attending: Pulmonary Disease | Admitting: Pulmonary Disease

## 2020-12-02 DIAGNOSIS — J849 Interstitial pulmonary disease, unspecified: Secondary | ICD-10-CM

## 2020-12-02 NOTE — Progress Notes (Signed)
Daily Session Note  Patient Details  Name: Mathew Lara MRN: [id number] Date of Birth: 1961/05/20 Referring Provider:   Flowsheet Row PULMONARY REHAB OTHER RESP ORIENTATION from 08/07/2020 in [place]  Referring Provider Dr. Vaughan Browner       Encounter Date: 12/02/2020  Check In:  Session Check In - 12/02/20 1330      Check-In   Supervising physician immediately available to respond to emergencies [facility] MD immediately available    Physician(s) Dr. Domenic Polite    Location [facility]    Staff Present Cathren Harsh, MS, Exercise Physiologist;Phyllis Billingsley, Edison Simon, MS, ACSM-CEP, Exercise Physiologist    Virtual Visit No    Medication changes reported     No    Fall or balance concerns reported    No    Tobacco Cessation No Change    Warm-up and Cool-down Performed as group-led instruction    Resistance Training Performed Yes    VAD Patient? No    PAD/SET Patient? No      Pain Assessment   Currently in Pain? Yes    Pain Score 5     Pain Location Back    Pain Orientation Lower    Pain Descriptors / Indicators Constant    Pain Type Chronic pain    Multiple Pain Sites No           Capillary Blood Glucose: No results found for this or any previous visit (from the past 24 hour(s)).    Social History   Tobacco Use  Smoking Status Former Smoker  . Packs/day: 1.00  . Years: 42.00  . Pack years: 42.00  . Types: Cigarettes  . Start date: 09/07/1971  . Quit date: 05/28/2014  . Years since quitting: 6.5  Smokeless Tobacco Never Used  Tobacco Comment   quit in November 2015 after smoking x 20 yrs.    Goals Met:  Independence with exercise equipment Exercise tolerated well No report of cardiac concerns or symptoms Strength training completed today  Goals Unmet:  Not Applicable  Comments: check out 1430   Dr. Kathie Dike is Medical Director for [facility] Pulmonary Rehab.

## 2020-12-04 ENCOUNTER — Other Ambulatory Visit: Payer: Self-pay

## 2020-12-04 ENCOUNTER — Encounter (HOSPITAL_COMMUNITY)
Admission: RE | Admit: 2020-12-04 | Discharge: 2020-12-04 | Disposition: A | Discharge status: Home / Self Care | Payer: 59 | Source: Ambulatory Visit | Attending: Pulmonary Disease | Admitting: Pulmonary Disease

## 2020-12-04 DIAGNOSIS — J849 Interstitial pulmonary disease, unspecified: Secondary | ICD-10-CM

## 2020-12-04 NOTE — Progress Notes (Signed)
Daily Session Note  Patient Details  Name: Mathew Lara MRN: [id number] Date of Birth: 1960/10/27 Referring Provider:   Flowsheet Row PULMONARY REHAB OTHER RESP ORIENTATION from 08/07/2020 in [place]  Referring Provider Dr. Vaughan Browner       Encounter Date: 12/04/2020  Check In:  Session Check In - 12/04/20 1330      Check-In   Supervising physician immediately available to respond to emergencies [facility] immediately available    Physician(s) Dr. Domenic Polite    Location [facility]    Staff Present Cathren Harsh, MS, Exercise Physiologist;Ekansh Sherk Wynetta Emery, RN, BSN    Virtual Visit No    Medication changes reported     No    Fall or balance concerns reported    No    Tobacco Cessation No Change    Warm-up and Cool-down Performed as group-led instruction    Resistance Training Performed Yes    VAD Patient? No    PAD/SET Patient? No      Pain Assessment   Currently in Pain? Yes    Pain Score 5     Pain Location Back    Pain Orientation Lower    Pain Descriptors / Indicators Constant    Pain Type Chronic pain    Pain Onset More than a month ago    Pain Frequency Constant    Multiple Pain Sites No           Capillary Blood Glucose: No results found for this or any previous visit (from the past 24 hour(s)).    Social History   Tobacco Use  Smoking Status Former Smoker  . Packs/day: 1.00  . Years: 42.00  . Pack years: 42.00  . Types: Cigarettes  . Start date: 09/07/1971  . Quit date: 05/28/2014  . Years since quitting: 6.5  Smokeless Tobacco Never Used  Tobacco Comment   quit in November 2015 after smoking x 20 yrs.    Goals Met:  Proper associated with RPD/PD & O2 Sat Independence with exercise equipment Improved SOB with ADL's Using PLB without cueing & demonstrates good technique Exercise tolerated well No report of cardiac concerns or symptoms Strength training completed today  Goals Unmet:  Not  Applicable  Comments: Check out 1430.   Dr. Kathie Dike is Medical Director for [facility] Pulmonary Rehab.

## 2020-12-09 ENCOUNTER — Other Ambulatory Visit: Payer: Self-pay

## 2020-12-09 ENCOUNTER — Encounter (HOSPITAL_COMMUNITY)
Admission: RE | Admit: 2020-12-09 | Discharge: 2020-12-09 | Disposition: A | Discharge status: Home / Self Care | Payer: Medicaid Other | Source: Ambulatory Visit | Attending: Pulmonary Disease | Admitting: Pulmonary Disease

## 2020-12-09 VITALS — Wt 298.7 lb

## 2020-12-09 DIAGNOSIS — J849 Interstitial pulmonary disease, unspecified: Secondary | ICD-10-CM | POA: Diagnosis not present

## 2020-12-09 NOTE — Progress Notes (Signed)
Daily Session Note  Patient Details  Name: Mathew Lara MRN: [id number] Date of Birth: Feb 28, 1961 Referring Provider:   Flowsheet Row PULMONARY REHAB OTHER RESP ORIENTATION from 08/07/2020 in [place]  Referring Provider Dr. Vaughan Browner       Encounter Date: 12/09/2020  Check In:  Session Check In - 12/09/20 1330      Check-In   Supervising physician immediately available to respond to emergencies [facility] MD immediately available    Physician(s) Dr. Harl Bowie    Location [facility]    Staff Present Cathren Harsh, MS, Exercise Physiologist;Heriberto Stmartin Kris Mouton, MS, ACSM-CEP, Exercise Physiologist;Phyllis Billingsley, RN    Virtual Visit No    Medication changes reported     No    Fall or balance concerns reported    No    Tobacco Cessation No Change    Warm-up and Cool-down Performed as group-led instruction    Resistance Training Performed Yes    VAD Patient? No    PAD/SET Patient? No      Pain Assessment   Currently in Pain? Yes    Pain Score 5     Pain Location Back    Pain Orientation Lower    Pain Descriptors / Indicators Constant    Pain Type Chronic pain    Pain Onset More than a month ago    Pain Frequency Constant    Aggravating Factors  weather    Pain Relieving Factors oxycodone    Effect of Pain on Daily Activities affects mobility at times    Multiple Pain Sites No           Capillary Blood Glucose: No results found for this or any previous visit (from the past 24 hour(s)).    Social History   Tobacco Use  Smoking Status Former Smoker  . Packs/day: 1.00  . Years: 42.00  . Pack years: 42.00  . Types: Cigarettes  . Start date: 09/07/1971  . Quit date: 05/28/2014  . Years since quitting: 6.5  Smokeless Tobacco Never Used  Tobacco Comment   quit in November 2015 after smoking x 20 yrs.    Goals Met:  Independence with exercise equipment Exercise tolerated well No report of cardiac concerns or symptoms Strength  training completed today  Goals Unmet:  Not Applicable  Comments: checkout time is 1430   Dr. Kathie Dike is Medical Director for [facility] Pulmonary Rehab.

## 2020-12-10 NOTE — Progress Notes (Signed)
Pulmonary Individual Treatment Plan  Patient Details  Name: Mathew Lara MRN: [id number] Date of Birth: October 05, 1960 Referring Provider:   [facility] [facility] from 08/07/2020 in [facility]  Referring Provider Dr. Vaughan Browner       Initial Encounter Date:  Flowsheet Row PULMONARY REHAB OTHER RESP ORIENTATION from 08/07/2020 in [facility] [place]  Date 08/07/20      Visit Diagnosis: Interstitial lung disease ([place])  Patient's Home Medications on Admission:   Current Outpatient Medications:  .  acetaminophen (TYLENOL) 325 MG tablet, Take 2 tablets (650 mg total) by mouth every 6 (six) hours as needed for mild pain or headache (fever >/= 101)., Disp: 12 tablet, Rfl: 0 .  amLODipine (NORVASC) 10 MG tablet, Take 10 mg by mouth daily., Disp: , Rfl:  .  ascorbic acid (VITAMIN C) 500 MG tablet, Take 1 tablet (500 mg total) by mouth daily., Disp: 30 tablet, Rfl: 1 .  aspirin EC 81 MG tablet, Take 81 mg by mouth daily., Disp: , Rfl:  .  DULoxetine (CYMBALTA) 30 MG capsule, Take 60 mg by mouth daily., Disp: , Rfl:  .  Fluticasone-Salmeterol (ADVAIR) 250-50 MCG/DOSE AEPB, Inhale 1 puff into the lungs 2 (two) times daily., Disp: 60 each, Rfl: 5 .  HYDROcodone-acetaminophen (NORCO/VICODIN) 5-325 MG tablet, Take 1 tablet by mouth every 6 (six) hours as needed for moderate pain., Disp: , Rfl:  .  hydrOXYzine (ATARAX/VISTARIL) 25 MG tablet, Take 25 mg by mouth 3 (three) times daily as needed for anxiety., Disp: , Rfl:  .  loratadine (CLARITIN) 10 MG tablet, Take 1 tablet (10 mg total) by mouth daily., Disp: 30 tablet, Rfl: 1 .  losartan (COZAAR) 25 MG tablet, Take 25 mg by mouth daily. , Disp: , Rfl:  .  oxyCODONE (OXY IR/ROXICODONE) 5 MG immediate release tablet, Take 5 mg by mouth every 8 (eight) hours as needed., Disp: , Rfl:  .  PROAIR HFA 108 (90 Base) MCG/ACT inhaler, Inhale 2 puffs into the lungs every 4 (four) hours as needed for  wheezing or shortness of breath., Disp: 18 g, Rfl: 3 .  sodium bicarbonate 650 MG tablet, Take 650 mg by mouth 3 (three) times daily., Disp: , Rfl:  .  zinc sulfate 220 (50 Zn) MG capsule, Take 1 capsule (220 mg total) by mouth daily., Disp: 30 capsule, Rfl: 1  Past Medical History: Past Medical History:  Diagnosis Date  . Alcohol abuse   . Anxiety   . Bronchitis   . Chronic hip pain   . CKD (chronic kidney disease) stage 3, GFR 30-59 ml/min (HCC)   . COPD (chronic obstructive pulmonary disease) ([place])   . Essential hypertension   . Hepatitis C   . Mixed hyperlipidemia   . Sleep apnea     Tobacco Use: Social History   Tobacco Use  Smoking Status Former Smoker  . Packs/day: 1.00  . Years: 42.00  . Pack years: 42.00  . Types: Cigarettes  . Start date: 09/07/1971  . Quit date: 05/28/2014  . Years since quitting: 6.5  Smokeless Tobacco Never Used  Tobacco Comment   quit in November 2015 after smoking x 20 yrs.    Labs: Recent Review Flowsheet Data    Labs for ITP Cardiac and Pulmonary Rehab Latest Ref Rng & Units 08/29/2019 09/28/2019   Trlycerides <150 mg/dL 199(H) -   PHART 7.350 - 7.450 7.393 7.343(L)    PCO2ART 32.0 - 48.0 mmHg 40.5 45.5  HCO3 20.0 - 28.0 mmol/L 24.2 24.1    ACIDBASEDEF 0.0 - 2.0 mmol/L 0.1 1.3    O2SAT % 90.7 94.1       Capillary Blood Glucose: Lab Results  Component Value Date   GLUCAP 144 (H) 03/02/2019     Pulmonary Assessment Scores:  Pulmonary Assessment Scores    Row Name 08/07/20 1257         ADL UCSD   ADL Phase Entry     SOB Score total 76           CAT Score   CAT Score 28           mMRC Score   mMRC Score 2           UCSD: Self-administered rating of dyspnea associated with activities of daily living (ADLs) 6-point scale (0 = "not at all" to 5 = "maximal or unable to do because of breathlessness")  Scoring Scores range from 0 to 120.  Minimally important difference is 5 units  CAT: CAT can identify the health  impairment of COPD patients and is better correlated with disease progression.  CAT has a scoring range of zero to 40. The CAT score is classified into four groups of low (less than 10), medium (10 - 20), high (21-30) and very high (31-40) based on the impact level of disease on health status. A CAT score over 10 suggests significant symptoms.  A worsening CAT score could be explained by an exacerbation, poor medication adherence, poor inhaler technique, or progression of COPD or comorbid conditions.  CAT MCID is 2 points  mMRC: mMRC (Modified Medical Research Council) Dyspnea Scale is used to assess the degree of baseline functional disability in patients of respiratory disease due to dyspnea. No minimal important difference is established. A decrease in score of 1 point or greater is considered a positive change.   Pulmonary Function Assessment:  Pulmonary Function Assessment - 08/07/20 1246      Breath   Shortness of Breath No           Exercise Target Goals: Exercise Program Goal: Individual exercise prescription set using results from initial 6 min walk test and THRR while considering  patient's activity barriers and safety.   Exercise Prescription Goal: Initial exercise prescription builds to 30-45 minutes a day of aerobic activity, 2-3 days per week.  Home exercise guidelines will be given to patient during program as part of exercise prescription that the participant will acknowledge.  Activity Barriers & Risk Stratification:  Activity Barriers & Cardiac Risk Stratification - 08/07/20 1244      Activity Barriers & Cardiac Risk Stratification   Activity Barriers Arthritis;Back Problems;Left Hip Replacement;Right Hip Replacement;Deconditioning    Cardiac Risk Stratification High           6 Minute Walk:  6 Minute Walk    Row Name 08/07/20 1546         6 Minute Walk   Phase Initial     Distance 900 feet     Walk Time 6 minutes     # of Rest Breaks 1     MPH 1.71      METS 2.67     RPE 10     Perceived Dyspnea  14     VO2 Peak 9.36     Symptoms Yes (comment)     Comments Stopped for ~30 sec due to desaturation to put oxygen on     Resting HR 104 bpm  Resting BP 112/70     Resting Oxygen Saturation  93 %     Exercise Oxygen Saturation  during 6 min walk 85 %     Max Ex. HR 141 bpm     Max Ex. BP 184/68     2 Minute Post BP 144/72           Interval HR   1 Minute HR 110     2 Minute HR 125     3 Minute HR 129     4 Minute HR 133     5 Minute HR 133     6 Minute HR 141     2 Minute Post HR 126     Interval Heart Rate? Yes           Interval Oxygen   Interval Oxygen? Yes     Baseline Oxygen Saturation % 93 %     1 Minute Oxygen Saturation % 85 %     1 Minute Liters of Oxygen 0 L     2 Minute Oxygen Saturation % 91 %     2 Minute Liters of Oxygen 2 L     3 Minute Oxygen Saturation % 90 %     3 Minute Liters of Oxygen 2 L     4 Minute Oxygen Saturation % 88 %     4 Minute Liters of Oxygen 2 L     5 Minute Oxygen Saturation % 88 %     5 Minute Liters of Oxygen 2 L     6 Minute Oxygen Saturation % 87 %     6 Minute Liters of Oxygen 2 L     2 Minute Post Oxygen Saturation % 97 %     2 Minute Post Liters of Oxygen 2 L            Oxygen Initial Assessment:  Oxygen Initial Assessment - 08/07/20 1545      Initial 6 min Walk   Oxygen Used Continuous    Liters per minute 2      Program Oxygen Prescription   Program Oxygen Prescription Continuous    Liters per minute 2      Intervention   Short Term Goals To learn and exhibit compliance with exercise, home and travel O2 prescription;To learn and understand importance of monitoring SPO2 with pulse oximeter and demonstrate accurate use of the pulse oximeter.;To learn and understand importance of maintaining oxygen saturations>88%;To learn and demonstrate proper pursed lip breathing techniques or other breathing techniques.;To learn and demonstrate proper use of respiratory  medications    Long  Term Goals Exhibits compliance with exercise, home and travel O2 prescription;Verbalizes importance of monitoring SPO2 with pulse oximeter and return demonstration;Maintenance of O2 saturations>88%;Exhibits proper breathing techniques, such as pursed lip breathing or other method taught during program session;Compliance with respiratory medication;Demonstrates proper use of MDI's           Oxygen Re-Evaluation:  Oxygen Re-Evaluation    Row Name 08/19/20 1451 09/16/20 1522 10/14/20 0756 11/11/20 1512 12/09/20 1546     Program Oxygen Prescription   Program Oxygen Prescription Continuous Continuous Continuous Continuous Continuous   Liters per minute 2 2 2 2 2      Home Oxygen   Home Oxygen Device E-Tanks;Home Concentrator E-Tanks;Home Concentrator E-Tanks;Home Concentrator E-Tanks;Home Concentrator E-Tanks;Home Concentrator   Sleep Oxygen Prescription CPAP CPAP CPAP CPAP CPAP   Home Exercise Oxygen Prescription Continuous Continuous Continuous Continuous Continuous   Liters per minute 2  2 2 2 2    Home Resting Oxygen Prescription None None None None None   Compliance with Home Oxygen Use Yes Yes Yes Yes Yes     Goals/Expected Outcomes   Short Term Goals To learn and exhibit compliance with exercise, home and travel O2 prescription;To learn and understand importance of monitoring SPO2 with pulse oximeter and demonstrate accurate use of the pulse oximeter.;To learn and understand importance of maintaining oxygen saturations>88%;To learn and demonstrate proper pursed lip breathing techniques or other breathing techniques.;To learn and demonstrate proper use of respiratory medications To learn and exhibit compliance with exercise, home and travel O2 prescription;To learn and understand importance of monitoring SPO2 with pulse oximeter and demonstrate accurate use of the pulse oximeter.;To learn and understand importance of maintaining oxygen saturations>88%;To learn and  demonstrate proper pursed lip breathing techniques or other breathing techniques.;To learn and demonstrate proper use of respiratory medications To learn and exhibit compliance with exercise, home and travel O2 prescription;To learn and understand importance of monitoring SPO2 with pulse oximeter and demonstrate accurate use of the pulse oximeter.;To learn and understand importance of maintaining oxygen saturations>88%;To learn and demonstrate proper pursed lip breathing techniques or other breathing techniques.;To learn and demonstrate proper use of respiratory medications To learn and exhibit compliance with exercise, home and travel O2 prescription;To learn and understand importance of monitoring SPO2 with pulse oximeter and demonstrate accurate use of the pulse oximeter.;To learn and understand importance of maintaining oxygen saturations>88%;To learn and demonstrate proper pursed lip breathing techniques or other breathing techniques.;To learn and demonstrate proper use of respiratory medications To learn and exhibit compliance with exercise, home and travel O2 prescription;To learn and understand importance of monitoring SPO2 with pulse oximeter and demonstrate accurate use of the pulse oximeter.;To learn and understand importance of maintaining oxygen saturations>88%;To learn and demonstrate proper pursed lip breathing techniques or other breathing techniques.;To learn and demonstrate proper use of respiratory medications   Long  Term Goals Exhibits compliance with exercise, home and travel O2 prescription;Verbalizes importance of monitoring SPO2 with pulse oximeter and return demonstration;Maintenance of O2 saturations>88%;Exhibits proper breathing techniques, such as pursed lip breathing or other method taught during program session;Compliance with respiratory medication;Demonstrates proper use of MDI's Exhibits compliance with exercise, home and travel O2 prescription;Verbalizes importance of  monitoring SPO2 with pulse oximeter and return demonstration;Maintenance of O2 saturations>88%;Exhibits proper breathing techniques, such as pursed lip breathing or other method taught during program session;Compliance with respiratory medication;Demonstrates proper use of MDI's Exhibits compliance with exercise, home and travel O2 prescription;Verbalizes importance of monitoring SPO2 with pulse oximeter and return demonstration;Maintenance of O2 saturations>88%;Exhibits proper breathing techniques, such as pursed lip breathing or other method taught during program session;Compliance with respiratory medication;Demonstrates proper use of MDI's Exhibits compliance with exercise, home and travel O2 prescription;Verbalizes importance of monitoring SPO2 with pulse oximeter and return demonstration;Maintenance of O2 saturations>88%;Exhibits proper breathing techniques, such as pursed lip breathing or other method taught during program session;Compliance with respiratory medication;Demonstrates proper use of MDI's Exhibits compliance with exercise, home and travel O2 prescription;Verbalizes importance of monitoring SPO2 with pulse oximeter and return demonstration;Maintenance of O2 saturations>88%;Exhibits proper breathing techniques, such as pursed lip breathing or other method taught during program session;Compliance with respiratory medication;Demonstrates proper use of MDI's   Goals/Expected Outcomes compliance compliance compliance compliance compliance          Oxygen Discharge (Final Oxygen Re-Evaluation):  Oxygen Re-Evaluation - 12/09/20 1546      Program Oxygen Prescription   Program Oxygen Prescription Continuous  Liters per minute 2      Home Oxygen   Home Oxygen Device E-Tanks;Home Concentrator    Sleep Oxygen Prescription CPAP    Home Exercise Oxygen Prescription Continuous    Liters per minute 2    Home Resting Oxygen Prescription None    Compliance with Home Oxygen Use Yes       Goals/Expected Outcomes   Short Term Goals To learn and exhibit compliance with exercise, home and travel O2 prescription;To learn and understand importance of monitoring SPO2 with pulse oximeter and demonstrate accurate use of the pulse oximeter.;To learn and understand importance of maintaining oxygen saturations>88%;To learn and demonstrate proper pursed lip breathing techniques or other breathing techniques.;To learn and demonstrate proper use of respiratory medications    Long  Term Goals Exhibits compliance with exercise, home and travel O2 prescription;Verbalizes importance of monitoring SPO2 with pulse oximeter and return demonstration;Maintenance of O2 saturations>88%;Exhibits proper breathing techniques, such as pursed lip breathing or other method taught during program session;Compliance with respiratory medication;Demonstrates proper use of MDI's    Goals/Expected Outcomes compliance           Initial Exercise Prescription:  Initial Exercise Prescription - 08/07/20 1500      Date of Initial Exercise RX and Referring Provider   Date 08/07/20    Referring Provider Dr. Vaughan Browner     Expected Discharge Date 12/11/20      Oxygen   Oxygen Continuous    Liters 2      NuStep   Level 1    SPM 80    Minutes 17      Arm Ergometer   Level 1    RPM 60    Minutes 22      Prescription Details   Frequency (times per week) 2    Duration Progress to 30 minutes of continuous aerobic without signs/symptoms of physical distress      Intensity   THRR 40-80% of Max Heartrate 64-128    Ratings of Perceived Exertion 11-13    Perceived Dyspnea 0-4      Resistance Training   Training Prescription Yes    Weight 3 lbs    Reps 10-15           Perform Capillary Blood Glucose checks as needed.  Exercise Prescription Changes:   Exercise Prescription Changes    Row Name 08/19/20 1400 09/02/20 1400 09/16/20 1500 09/30/20 1600 10/02/20 1400     Response to Exercise   Blood Pressure  (Admit) 118/80 128/76 128/84 146/90 --   Blood Pressure (Exercise) 152/70 150/90 134/84 130/88 --   Blood Pressure (Exit) 118/70 124/70 120/80 130/74 --   Heart Rate (Admit) 98 bpm 95 bpm 94 bpm 96 bpm --   Heart Rate (Exercise) 115 bpm 118 bpm 111 bpm 114 bpm --   Heart Rate (Exit) 105 bpm 93 bpm 102 bpm 108 bpm --   Oxygen Saturation (Admit) 93 % 96 % 97 % 97 % --   Oxygen Saturation (Exercise) 96 % 96 % 94 % 96 % --   Oxygen Saturation (Exit) 97 % 97 % 97 % 96 % --   Rating of Perceived Exertion (Exercise) 12 12 12 12  --   Perceived Dyspnea (Exercise) 6 9 9 10  --   Duration Continue with 30 min of aerobic exercise without signs/symptoms of physical distress. Continue with 30 min of aerobic exercise without signs/symptoms of physical distress. Continue with 30 min of aerobic exercise without signs/symptoms of physical distress. Continue with  30 min of aerobic exercise without signs/symptoms of physical distress. --   Intensity THRR unchanged THRR unchanged THRR unchanged THRR unchanged --     Progression   Progression Continue to progress workloads to maintain intensity without signs/symptoms of physical distress. Continue to progress workloads to maintain intensity without signs/symptoms of physical distress. Continue to progress workloads to maintain intensity without signs/symptoms of physical distress. Continue to progress workloads to maintain intensity without signs/symptoms of physical distress. --     Horticulturist, commercial Prescription Yes Yes Yes Yes --   Weight 3 lbs 4 lbs 4 lbs 4 lbs --   Reps 10-15 10-15 10-15 10-15 --   Time -- 10 Minutes 10 Minutes 10 Minutes --     Oxygen   Oxygen Continuous Continuous Continuous Continuous --   Liters 2 2 2 2  --     NuStep   Level 1 1 2 2  --   SPM 86 96 94 90 --   Minutes 22 22 17 17  --   METs 1.9 2.1 2.1 2 --     Arm Ergometer   Level 1 2 2 2  --   RPM 46 43 48 50 --   Minutes 17 17 22 22  --   METs 1.4 1.5 1.6 2 --      Home Exercise Plan   Plans to continue exercise at -- -- -- -- Home (comment)   Frequency -- -- -- -- Add 3 additional days to program exercise sessions.   Initial Home Exercises Provided -- -- -- -- 10/02/20   Row Name 10/14/20 0750 10/28/20 1400 11/11/20 1500 11/25/20 1500 12/09/20 1500     Response to Exercise   Blood Pressure (Admit) 124/76 132/86 122/86 122/62 118/78   Blood Pressure (Exercise) 148/72 122/68 156/92 152/86 142/84   Blood Pressure (Exit) 132/74 112/88 110/82 112/68 108/60   Heart Rate (Admit) 99 bpm 103 bpm 95 bpm 106 bpm 98 bpm   Heart Rate (Exercise) 110 bpm 122 bpm 118 bpm 118 bpm 130 bpm   Heart Rate (Exit) 106 bpm 107 bpm 103 bpm 105 bpm 112 bpm   Oxygen Saturation (Admit) 97 % 96 % 94 % 92 % 93 %   Oxygen Saturation (Exercise) 96 % 92 % 93 % 95 % 94 %   Oxygen Saturation (Exit) 96 % 96 % 94 % 95 % 95 %   Rating of Perceived Exertion (Exercise) 12 11 11 11 11    Perceived Dyspnea (Exercise) 9 11 11 11 11    Duration Continue with 30 min of aerobic exercise without signs/symptoms of physical distress. Continue with 30 min of aerobic exercise without signs/symptoms of physical distress. Continue with 30 min of aerobic exercise without signs/symptoms of physical distress. Continue with 30 min of aerobic exercise without signs/symptoms of physical distress. Continue with 30 min of aerobic exercise without signs/symptoms of physical distress.   Intensity THRR unchanged THRR unchanged THRR unchanged THRR unchanged THRR unchanged     Progression   Progression Continue to progress workloads to maintain intensity without signs/symptoms of physical distress. Continue to progress workloads to maintain intensity without signs/symptoms of physical distress. Continue to progress workloads to maintain intensity without signs/symptoms of physical distress. Continue to progress workloads to maintain intensity without signs/symptoms of physical distress. Continue to progress workloads  to maintain intensity without signs/symptoms of physical distress.     Resistance Training   Training Prescription Yes Yes Yes Yes Yes   Weight 5 lb 5 lbs 5  lbs 5 lbs 5 lbs   Reps 10-15 10-15 10-15 10-15 10-15   Time 10 Minutes 10 Minutes 10 Minutes 10 Minutes 10 Minutes     Oxygen   Oxygen Continuous Continuous Continuous Continuous Continuous   Liters 2 2 2 2 2      NuStep   Level 2 3 5 4 4    SPM 92 96 89 89 100   Minutes 17 17 17 17 17    METs 2.1 2 2.4 2.1 2.4     Arm Ergometer   Level 2.5 4 5 4 4    RPM 40 37 34 42 41   Minutes 22 22 22 22 22    METs 1.5 1.8 1.8 1.8 2          Exercise Comments:   Exercise Comments    Row Name 10/02/20 1439           Exercise Comments Reviewed home exercise program today with patient. He is not currently walking at home so he will start to walk and do some light resistance training at home.              Exercise Goals and Review:   Exercise Goals    Row Name 08/07/20 1550 08/19/20 1454 09/16/20 1525 10/14/20 0753 11/11/20 1514     Exercise Goals   Increase Physical Activity Yes Yes Yes Yes Yes   Intervention Provide advice, education, support and counseling about physical activity/exercise needs.;Develop an individualized exercise prescription for aerobic and resistive training based on initial evaluation findings, risk stratification, comorbidities and participant's personal goals. Provide advice, education, support and counseling about physical activity/exercise needs.;Develop an individualized exercise prescription for aerobic and resistive training based on initial evaluation findings, risk stratification, comorbidities and participant's personal goals. Provide advice, education, support and counseling about physical activity/exercise needs.;Develop an individualized exercise prescription for aerobic and resistive training based on initial evaluation findings, risk stratification, comorbidities and participant's personal goals.  Provide advice, education, support and counseling about physical activity/exercise needs.;Develop an individualized exercise prescription for aerobic and resistive training based on initial evaluation findings, risk stratification, comorbidities and participant's personal goals. Provide advice, education, support and counseling about physical activity/exercise needs.;Develop an individualized exercise prescription for aerobic and resistive training based on initial evaluation findings, risk stratification, comorbidities and participant's personal goals.   Expected Outcomes Short Term: Attend rehab on a regular basis to increase amount of physical activity.;Long Term: Add in home exercise to make exercise part of routine and to increase amount of physical activity.;Long Term: Exercising regularly at least 3-5 days a week. Short Term: Attend rehab on a regular basis to increase amount of physical activity.;Long Term: Add in home exercise to make exercise part of routine and to increase amount of physical activity.;Long Term: Exercising regularly at least 3-5 days a week. Short Term: Attend rehab on a regular basis to increase amount of physical activity.;Long Term: Add in home exercise to make exercise part of routine and to increase amount of physical activity.;Long Term: Exercising regularly at least 3-5 days a week. Short Term: Attend rehab on a regular basis to increase amount of physical activity.;Long Term: Add in home exercise to make exercise part of routine and to increase amount of physical activity.;Long Term: Exercising regularly at least 3-5 days a week. Short Term: Attend rehab on a regular basis to increase amount of physical activity.;Long Term: Add in home exercise to make exercise part of routine and to increase amount of physical activity.;Long Term: Exercising regularly at least  3-5 days a week.   Increase Strength and Stamina Yes Yes Yes Yes Yes   Intervention Provide advice, education, support  and counseling about physical activity/exercise needs.;Develop an individualized exercise prescription for aerobic and resistive training based on initial evaluation findings, risk stratification, comorbidities and participant's personal goals. Provide advice, education, support and counseling about physical activity/exercise needs.;Develop an individualized exercise prescription for aerobic and resistive training based on initial evaluation findings, risk stratification, comorbidities and participant's personal goals. Provide advice, education, support and counseling about physical activity/exercise needs.;Develop an individualized exercise prescription for aerobic and resistive training based on initial evaluation findings, risk stratification, comorbidities and participant's personal goals. Provide advice, education, support and counseling about physical activity/exercise needs.;Develop an individualized exercise prescription for aerobic and resistive training based on initial evaluation findings, risk stratification, comorbidities and participant's personal goals. Provide advice, education, support and counseling about physical activity/exercise needs.;Develop an individualized exercise prescription for aerobic and resistive training based on initial evaluation findings, risk stratification, comorbidities and participant's personal goals.   Expected Outcomes Short Term: Increase workloads from initial exercise prescription for resistance, speed, and METs.;Short Term: Perform resistance training exercises routinely during rehab and add in resistance training at home;Long Term: Improve cardiorespiratory fitness, muscular endurance and strength as measured by increased METs and functional capacity (6MWT) Short Term: Increase workloads from initial exercise prescription for resistance, speed, and METs.;Short Term: Perform resistance training exercises routinely during rehab and add in resistance training at  home;Long Term: Improve cardiorespiratory fitness, muscular endurance and strength as measured by increased METs and functional capacity (6MWT) Short Term: Increase workloads from initial exercise prescription for resistance, speed, and METs.;Short Term: Perform resistance training exercises routinely during rehab and add in resistance training at home;Long Term: Improve cardiorespiratory fitness, muscular endurance and strength as measured by increased METs and functional capacity (6MWT) Short Term: Increase workloads from initial exercise prescription for resistance, speed, and METs.;Short Term: Perform resistance training exercises routinely during rehab and add in resistance training at home;Long Term: Improve cardiorespiratory fitness, muscular endurance and strength as measured by increased METs and functional capacity (6MWT) Short Term: Increase workloads from initial exercise prescription for resistance, speed, and METs.;Short Term: Perform resistance training exercises routinely during rehab and add in resistance training at home;Long Term: Improve cardiorespiratory fitness, muscular endurance and strength as measured by increased METs and functional capacity (6MWT)   Able to understand and use rate of perceived exertion (RPE) scale Yes Yes Yes Yes Yes   Intervention Provide education and explanation on how to use RPE scale Provide education and explanation on how to use RPE scale Provide education and explanation on how to use RPE scale Provide education and explanation on how to use RPE scale Provide education and explanation on how to use RPE scale   Expected Outcomes Short Term: Able to use RPE daily in rehab to express subjective intensity level;Long Term:  Able to use RPE to guide intensity level when exercising independently Short Term: Able to use RPE daily in rehab to express subjective intensity level;Long Term:  Able to use RPE to guide intensity level when exercising independently Short Term:  Able to use RPE daily in rehab to express subjective intensity level;Long Term:  Able to use RPE to guide intensity level when exercising independently Short Term: Able to use RPE daily in rehab to express subjective intensity level;Long Term:  Able to use RPE to guide intensity level when exercising independently Short Term: Able to use RPE daily in rehab to express subjective  intensity level;Long Term:  Able to use RPE to guide intensity level when exercising independently   Able to understand and use Dyspnea scale Yes Yes Yes Yes Yes   Intervention Provide education and explanation on how to use Dyspnea scale Provide education and explanation on how to use Dyspnea scale Provide education and explanation on how to use Dyspnea scale Provide education and explanation on how to use Dyspnea scale Provide education and explanation on how to use Dyspnea scale   Expected Outcomes Short Term: Able to use Dyspnea scale daily in rehab to express subjective sense of shortness of breath during exertion;Long Term: Able to use Dyspnea scale to guide intensity level when exercising independently Short Term: Able to use Dyspnea scale daily in rehab to express subjective sense of shortness of breath during exertion;Long Term: Able to use Dyspnea scale to guide intensity level when exercising independently Short Term: Able to use Dyspnea scale daily in rehab to express subjective sense of shortness of breath during exertion;Long Term: Able to use Dyspnea scale to guide intensity level when exercising independently Short Term: Able to use Dyspnea scale daily in rehab to express subjective sense of shortness of breath during exertion;Long Term: Able to use Dyspnea scale to guide intensity level when exercising independently Short Term: Able to use Dyspnea scale daily in rehab to express subjective sense of shortness of breath during exertion;Long Term: Able to use Dyspnea scale to guide intensity level when exercising  independently   Knowledge and understanding of Target Heart Rate Range (THRR) Yes Yes Yes Yes Yes   Intervention Provide education and explanation of THRR including how the numbers were predicted and where they are located for reference Provide education and explanation of THRR including how the numbers were predicted and where they are located for reference Provide education and explanation of THRR including how the numbers were predicted and where they are located for reference Provide education and explanation of THRR including how the numbers were predicted and where they are located for reference --   Expected Outcomes Short Term: Able to state/look up THRR;Long Term: Able to use THRR to govern intensity when exercising independently;Short Term: Able to use daily as guideline for intensity in rehab Short Term: Able to state/look up THRR;Long Term: Able to use THRR to govern intensity when exercising independently;Short Term: Able to use daily as guideline for intensity in rehab Short Term: Able to state/look up THRR;Long Term: Able to use THRR to govern intensity when exercising independently;Short Term: Able to use daily as guideline for intensity in rehab Short Term: Able to state/look up THRR;Long Term: Able to use THRR to govern intensity when exercising independently;Short Term: Able to use daily as guideline for intensity in rehab Short Term: Able to state/look up THRR;Long Term: Able to use THRR to govern intensity when exercising independently;Short Term: Able to use daily as guideline for intensity in rehab   Understanding of Exercise Prescription Yes Yes Yes Yes Yes   Intervention Provide education, explanation, and written materials on patient's individual exercise prescription Provide education, explanation, and written materials on patient's individual exercise prescription Provide education, explanation, and written materials on patient's individual exercise prescription Provide education,  explanation, and written materials on patient's individual exercise prescription Provide education, explanation, and written materials on patient's individual exercise prescription   Expected Outcomes Short Term: Able to explain program exercise prescription;Long Term: Able to explain home exercise prescription to exercise independently Short Term: Able to explain program exercise prescription;Long Term: Able to  explain home exercise prescription to exercise independently Short Term: Able to explain program exercise prescription;Long Term: Able to explain home exercise prescription to exercise independently Short Term: Able to explain program exercise prescription;Long Term: Able to explain home exercise prescription to exercise independently Short Term: Able to explain program exercise prescription;Long Term: Able to explain home exercise prescription to exercise independently   [place] Name 12/09/20 1548             Exercise Goals   Increase Physical Activity Yes       Intervention Provide advice, education, support and counseling about physical activity/exercise needs.;Develop an individualized exercise prescription for aerobic and resistive training based on initial evaluation findings, risk stratification, comorbidities and participant's personal goals.       Expected Outcomes Short Term: Attend rehab on a regular basis to increase amount of physical activity.;Long Term: Add in home exercise to make exercise part of routine and to increase amount of physical activity.;Long Term: Exercising regularly at least 3-5 days a week.       Increase Strength and Stamina Yes       Intervention Provide advice, education, support and counseling about physical activity/exercise needs.;Develop an individualized exercise prescription for aerobic and resistive training based on initial evaluation findings, risk stratification, comorbidities and participant's personal goals.       Expected Outcomes Short Term: Increase  workloads from initial exercise prescription for resistance, speed, and METs.;Short Term: Perform resistance training exercises routinely during rehab and add in resistance training at home;Long Term: Improve cardiorespiratory fitness, muscular endurance and strength as measured by increased METs and functional capacity (6MWT)       Able to understand and use rate of perceived exertion (RPE) scale Yes       Intervention Provide education and explanation on how to use RPE scale       Expected Outcomes Short Term: Able to use RPE daily in rehab to express subjective intensity level;Long Term:  Able to use RPE to guide intensity level when exercising independently       Able to understand and use Dyspnea scale Yes       Intervention Provide education and explanation on how to use Dyspnea scale       Expected Outcomes Short Term: Able to use Dyspnea scale daily in rehab to express subjective sense of shortness of breath during exertion;Long Term: Able to use Dyspnea scale to guide intensity level when exercising independently       Knowledge and understanding of Target Heart Rate Range (THRR) Yes       Intervention Provide education and explanation of THRR including how the numbers were predicted and where they are located for reference       Understanding of Exercise Prescription Yes       Intervention Provide education, explanation, and written materials on patient's individual exercise prescription       Expected Outcomes Short Term: Able to explain program exercise prescription;Long Term: Able to explain home exercise prescription to exercise independently              Exercise Goals Re-Evaluation :  Exercise Goals Re-Evaluation    [place] Name 08/19/20 1455 09/16/20 1525 10/14/20 0754 11/11/20 1514 12/09/20 1548     Exercise Goal Re-Evaluation   Exercise Goals Review Increase Physical Activity;Increase Strength and Stamina;Able to understand and use rate of perceived exertion (RPE) scale;Able to  understand and use Dyspnea scale;Knowledge and understanding of Target Heart Rate Range (THRR);Understanding of Exercise Prescription Increase Physical  Activity;Increase Strength and Stamina;Able to understand and use rate of perceived exertion (RPE) scale;Able to understand and use Dyspnea scale;Knowledge and understanding of Target Heart Rate Range (THRR);Understanding of Exercise Prescription Increase Physical Activity;Increase Strength and Stamina;Able to understand and use rate of perceived exertion (RPE) scale;Able to understand and use Dyspnea scale;Knowledge and understanding of Target Heart Rate Range (THRR);Understanding of Exercise Prescription Increase Physical Activity;Increase Strength and Stamina;Able to understand and use rate of perceived exertion (RPE) scale;Able to understand and use Dyspnea scale;Knowledge and understanding of Target Heart Rate Range (THRR);Understanding of Exercise Prescription Increase Physical Activity;Increase Strength and Stamina;Able to understand and use rate of perceived exertion (RPE) scale;Able to understand and use Dyspnea scale;Knowledge and understanding of Target Heart Rate Range (THRR);Understanding of Exercise Prescription   Comments Pt has attended 3 exercise sessions. He maintains a positive attitude and gives good effort. He is currently exercising at 1.9 METs on the stepper. Will continue to monitor and progress as able. Pt has attended 11 exercise sessions. He reports that he feels like he is getting stronger and is able to do his ADL's with less shortness of breath. He currently exercises at 2.1 METs on the stepper. Will continue to monitor and progress as able. Patient has completed 18 exercise sessions. He is progressing exercise well. He has started exercising at home regularly and reports that he feels it is helping him progress. He is getting stronger and is increasing his intensities on both aerobic training and resistance training. He is currently  exercising at 2.1 METs on the NuStep. Will continue to monitor and progress as able. Patient has comleted 26 pulmonary rehab sessions. He is progressing well and continues to increase his workloads. He reports that he feels much stronger now and is continuing to exercise at home as well. He currently is exercising at 2.4 METs on the stepper. Will continue to monitor and progress as able. Pt has completed 34 rehab sessions. He continues to increase his workloads and exercise at home. He will be graduating the program at the end of the week and is unsure where he will continue his exercise, but he has been encouraged to continue it somewhere. He currently exercises at 2.4 METs on the stepper. Will continue to monitor and progress as able.   Expected Outcomes Through exercise at rehab and by engaging in a home exercise program, the patient will meet their stated goals. Through exercise at rehab and by engaging in a home exercise program, the patient will meet their stated goals. Through exercise at rehab and by engaging in a home exercise program, the patient will meet their stated goals. Through exercise at rehab and by engaging in a home exercise program, the patient will meet their stated goals. Through exercise at rehab and by engaging in a home exercise program, the patient will meet their stated goals.          Discharge Exercise Prescription (Final Exercise Prescription Changes):  Exercise Prescription Changes - 12/09/20 1500      Response to Exercise   Blood Pressure (Admit) 118/78    Blood Pressure (Exercise) 142/84    Blood Pressure (Exit) 108/60    Heart Rate (Admit) 98 bpm    Heart Rate (Exercise) 130 bpm    Heart Rate (Exit) 112 bpm    Oxygen Saturation (Admit) 93 %    Oxygen Saturation (Exercise) 94 %    Oxygen Saturation (Exit) 95 %    Rating of Perceived Exertion (Exercise) 11    Perceived  Dyspnea (Exercise) 11    Duration Continue with 30 min of aerobic exercise without  signs/symptoms of physical distress.    Intensity THRR unchanged      Progression   Progression Continue to progress workloads to maintain intensity without signs/symptoms of physical distress.      Resistance Training   Training Prescription Yes    Weight 5 lbs    Reps 10-15    Time 10 Minutes      Oxygen   Oxygen Continuous    Liters 2      NuStep   Level 4    SPM 100    Minutes 17    METs 2.4      Arm Ergometer   Level 4    RPM 41    Minutes 22    METs 2           Nutrition:  Target Goals: Understanding of nutrition guidelines, daily intake of sodium 1500mg , cholesterol 200mg , calories 30% from fat and 7% or less from saturated fats, daily to have 5 or more servings of fruits and vegetables.  Biometrics:  Pre Biometrics - 11/25/20 1547      Pre Biometrics   Weight 136.4 kg            Nutrition Therapy Plan and Nutrition Goals:  Nutrition Therapy & Goals - 12/02/20 1551      Personal Nutrition Goals   Comments We continue to provide nutritional education through handouts. No changes in weight remains the same around 297-299#. Will continue to encourage him to modify diet.      Intervention Plan   Intervention Nutrition handout(s) given to patient.           Nutrition Assessments:  Nutrition Assessments - 08/07/20 1305      MEDFICTS Scores   Pre Score 15          MEDIFICTS Score Key:  ?70 Need to make dietary changes   40-70 Heart Healthy Diet  ? 40 Therapeutic Level Cholesterol Diet   Picture Your Plate Scores:  <16 Unhealthy dietary pattern with much room for improvement.  41-50 Dietary pattern unlikely to meet recommendations for good health and room for improvement.  51-60 More healthful dietary pattern, with some room for improvement.   >60 Healthy dietary pattern, although there may be some specific behaviors that could be improved.    Nutrition Goals Re-Evaluation:   Nutrition Goals Discharge (Final Nutrition Goals  Re-Evaluation):   Psychosocial: Target Goals: Acknowledge presence or absence of significant depression and/or stress, maximize coping skills, provide positive support system. Participant is able to verbalize types and ability to use techniques and skills needed for reducing stress and depression.  Initial Review & Psychosocial Screening:  Initial Psych Review & Screening - 08/07/20 1249      Initial Review   Current issues with None Identified      Family Dynamics   Good Support System? Yes    Comments His daughter is his support system. She lives nearby. He sees her frequently and they call each other frequently.      Barriers   Psychosocial barriers to participate in program There are no identifiable barriers or psychosocial needs.      Screening Interventions   Interventions Encouraged to exercise    Expected Outcomes Long Term Goal: Stressors or current issues are controlled or eliminated.;Short Term goal: Identification and review with participant of any Quality of Life or Depression concerns found by scoring the questionnaire.  Quality of Life Scores:  Quality of Life - 08/07/20 1544      Quality of Life   Select Quality of Life      Quality of Life Scores   Health/Function Pre 11.14 %    Socioeconomic Pre 13.5 %    Psych/Spiritual Pre 18.86 %    Family Pre 24 %    GLOBAL Pre 15.18 %          Scores of 19 and below usually indicate a poorer quality of life in these areas.  A difference of  2-3 points is a clinically meaningful difference.  A difference of 2-3 points in the total score of the Quality of Life Index has been associated with significant improvement in overall quality of life, self-image, physical symptoms, and general health in studies assessing change in quality of life.   PHQ-9: Recent Review Flowsheet Data    Depression screen [facility] 2/9 08/07/2020   Decreased Interest 1   Down, Depressed, Hopeless 1   PHQ - 2 Score 2   Altered sleeping  0   Tired, decreased energy 3   Change in appetite 0   Feeling bad or failure about yourself  1   Trouble concentrating 0   Moving slowly or fidgety/restless 1   Suicidal thoughts 0   PHQ-9 Score 7   Difficult doing work/chores Not difficult at all     Interpretation of Total Score  Total Score Depression Severity:  1-4 = Minimal depression, 5-9 = Mild depression, 10-14 = Moderate depression, 15-19 = Moderately severe depression, 20-27 = Severe depression   Psychosocial Evaluation and Intervention:   Psychosocial Re-Evaluation:  Psychosocial Re-Evaluation    [place] Name 08/18/20 0920 09/12/20 0953 10/03/20 1157 11/03/20 1426 12/02/20 1550     Psychosocial Re-Evaluation   Current issues with None Identified None Identified None Identified None Identified None Identified   Comments Patient's initial QOL score was 15.18% overall and his PHQ-9 score was 7. He has been on cymbalta 30 mg BID but reproted that he was not taking during pharmacy review at his orientation visit. He is new to the program completing 2 sessions. Will continue to moniotr. Patient continues to have no psychosocial issues identified. He has completed 10 sessions with consistent attendance. He demonstrates a very positive attitude and works hard during the sessions. Will continue to monitor. Patient continues to have no psychosocial issues identified. He has completed 15 sessions with consistent attendance. He demonstrates a very positive attitude and works hard during the sessions. Will continue to monitor. Patient continues to have no psychosocial issues identified. He has completed 23 sessions with consistent attendance. He demonstrates a very positive attitude and works hard during the sessions. Will continue to monitor. Patient continues to have no psychosocial issues identified. He has completed 32 sessions with consistent attendance. He demonstrates a very positive attitude and works hard during the sessions. Will  continue to monitor.   Expected Outcomes Patient will have no psychosocial issues identified at discharge. Patient will have no psychosocial issues identified at discharge. Patient will have no psychosocial issues identified at discharge. Patient will have no psychosocial issues identified at discharge. Patient will have no psychosocial issues identified at discharge.   Interventions Encouraged to attend Pulmonary Rehabilitation for the exercise;Relaxation education;Stress management education Encouraged to attend Pulmonary Rehabilitation for the exercise;Relaxation education;Stress management education Encouraged to attend Pulmonary Rehabilitation for the exercise;Relaxation education;Stress management education Encouraged to attend Cardiac Rehabilitation for the exercise;Relaxation education;Stress management education Encouraged to attend  Pulmonary Rehabilitation for the exercise;Relaxation education;Stress management education   Continue Psychosocial Services  No Follow up required No Follow up required No Follow up required No Follow up required No Follow up required          Psychosocial Discharge (Final Psychosocial Re-Evaluation):  Psychosocial Re-Evaluation - 12/02/20 1550      Psychosocial Re-Evaluation   Current issues with None Identified    Comments Patient continues to have no psychosocial issues identified. He has completed 32 sessions with consistent attendance. He demonstrates a very positive attitude and works hard during the sessions. Will continue to monitor.    Expected Outcomes Patient will have no psychosocial issues identified at discharge.    Interventions Encouraged to attend Pulmonary Rehabilitation for the exercise;Relaxation education;Stress management education    Continue Psychosocial Services  No Follow up required            Education: Education Goals: Education classes will be provided on a weekly basis, covering required topics. Participant will state  understanding/return demonstration of topics presented.  Learning Barriers/Preferences:  Learning Barriers/Preferences - 08/07/20 1246      Learning Barriers/Preferences   Learning Barriers None    Learning Preferences Skilled Demonstration;Individual Instruction           Education Topics: How Lungs Work and Diseases: - Discuss the anatomy of the lungs and diseases that can affect the lungs, such as COPD.   Exercise: -Discuss the importance of exercise, FITT principles of exercise, normal and abnormal responses to exercise, and how to exercise safely.   Environmental Irritants: -Discuss types of environmental irritants and how to limit exposure to environmental irritants. Flowsheet Row PULMONARY REHAB OTHER RESPIRATORY from 11/27/2020 in [place]  Date 09/18/20  Educator DF  Instruction Review Code 2- Demonstrated Understanding      Meds/Inhalers and oxygen: - Discuss respiratory medications, definition of an inhaler and oxygen, and the proper way to use an inhaler and oxygen. Flowsheet Row PULMONARY REHAB OTHER RESPIRATORY from 11/27/2020 in [place]  Date 09/25/20  Educator Handout      Energy Saving Techniques: - Discuss methods to conserve energy and decrease shortness of breath when performing activities of daily living.  Flowsheet Row PULMONARY REHAB OTHER RESPIRATORY from 11/27/2020 in [place]  Date 10/02/20  Educator Hand out  Instruction Review Code 1- Verbalizes Understanding      Bronchial Hygiene / Breathing Techniques: - Discuss breathing mechanics, pursed-lip breathing technique,  proper posture, effective ways to clear airways, and other functional breathing techniques Flowsheet Row PULMONARY REHAB OTHER RESPIRATORY from 11/27/2020 in [place]  Date 10/09/20  Educator pb  Instruction Review Code 1- Verbalizes Understanding      Cleaning  Equipment: - Provides group verbal and written instruction about the health risks of elevated stress, cause of high stress, and healthy ways to reduce stress. Flowsheet Row PULMONARY REHAB OTHER RESPIRATORY from 11/27/2020 in [place]  Date 10/16/20  Educator Handout  Instruction Review Code 1- Verbalizes Understanding      Nutrition I: Fats: - Discuss the types of cholesterol, what cholesterol does to the body, and how cholesterol levels can be controlled. Flowsheet Row PULMONARY REHAB OTHER RESPIRATORY from 11/27/2020 in [place]  Date 10/23/20  Educator Handout  Instruction Review Code 1- Verbalizes Understanding      Nutrition II: Labels: -Discuss the different components of food labels and how to read food labels. [place]  OTHER RESPIRATORY from 11/27/2020 in [place]  Date 10/30/20  Educator DF  Instruction Review Code 2- Demonstrated Understanding      Respiratory Infections: - Discuss the signs and symptoms of respiratory infections, ways to prevent respiratory infections, and the importance of seeking medical treatment when having a respiratory infection. Flowsheet Row PULMONARY REHAB OTHER RESPIRATORY from 11/27/2020 in [place]  Date 11/06/20  Educator DF  Instruction Review Code 2- Demonstrated Understanding      Stress I: Signs and Symptoms: - Discuss the causes of stress, how stress may lead to anxiety and depression, and ways to limit stress. Flowsheet Row PULMONARY REHAB OTHER RESPIRATORY from 11/27/2020 in [place]  Date 11/13/20  Educator DF  Instruction Review Code 1- Verbalizes Understanding      Stress II: Relaxation: -Discuss relaxation techniques to limit stress. Flowsheet Row PULMONARY REHAB OTHER RESPIRATORY from 11/27/2020 in [place]  Date 08/21/20  Educator [facility]  Instruction  Review Code 2- Demonstrated Understanding      Oxygen for Home/Travel: - Discuss how to prepare for travel when on oxygen and proper ways to transport and store oxygen to ensure safety. Flowsheet Row PULMONARY REHAB OTHER RESPIRATORY from 11/27/2020 in [place]  Date 08/28/20  Educator DJ  Instruction Review Code 1- Verbalizes Understanding      Knowledge Questionnaire Score:  Knowledge Questionnaire Score - 08/07/20 1258      Knowledge Questionnaire Score   Pre Score 5/18           Core Components/Risk Factors/Patient Goals at Admission:  Personal Goals and Risk Factors at Admission - 08/07/20 1247      Core Components/Risk Factors/Patient Goals on Admission    Weight Management Yes;Obesity    Intervention Weight Management: Develop a combined nutrition and exercise program designed to reach desired caloric intake, while maintaining appropriate intake of nutrient and fiber, sodium and fats, and appropriate energy expenditure required for the weight goal.;Weight Management: Provide education and appropriate resources to help participant work on and attain dietary goals.;Weight Management/Obesity: Establish reasonable short term and long term weight goals.;Obesity: Provide education and appropriate resources to help participant work on and attain dietary goals.    Admit Weight 294 lb (133.4 kg)    Goal Weight: Short Term 274 lb (124.3 kg)    Goal Weight: Long Term 250 lb (113.4 kg)    Expected Outcomes Short Term: Continue to assess and modify interventions until short term weight is achieved;Long Term: Adherence to nutrition and physical activity/exercise program aimed toward attainment of established weight goal;Weight Maintenance: Understanding of the daily nutrition guidelines, which includes 25-35% calories from fat, 7% or less cal from saturated fats, less than 200mg  cholesterol, less than 1.5gm of sodium, & 5 or more servings of fruits and vegetables  daily;Weight Loss: Understanding of general recommendations for a balanced deficit meal plan, which promotes 1-2 lb weight loss per week and includes a negative energy balance of [phone] kcal/d;Understanding recommendations for meals to include 15-35% energy as protein, 25-35% energy from fat, 35-60% energy from carbohydrates, less than 200mg  of dietary cholesterol, 20-35 gm of total fiber daily;Understanding of distribution of calorie intake throughout the day with the consumption of 4-5 meals/snacks;Weight Gain: Understanding of general recommendations for a high calorie, high protein meal plan that promotes weight gain by distributing calorie intake throughout the day with the consumption for 4-5 meals, snacks, and/or supplements    Improve shortness of breath with ADL's  Yes    Intervention Provide education, individualized exercise plan and daily activity instruction to help decrease symptoms of SOB with activities of daily living.    Expected Outcomes Short Term: Improve cardiorespiratory fitness to achieve a reduction of symptoms when performing ADLs;Long Term: Be able to perform more ADLs without symptoms or delay the onset of symptoms           Core Components/Risk Factors/Patient Goals Review:   Goals and Risk Factor Review    Row Name 08/18/20 7591 09/12/20 0954 10/03/20 1206 11/03/20 1429 12/02/20 1556     Core Components/Risk Factors/Patient Goals Review   Personal Goals Review Improve shortness of breath with ADL's;Weight Management/Obesity Improve shortness of breath with ADL's;Weight Management/Obesity Improve shortness of breath with ADL's;Weight Management/Obesity Improve shortness of breath with ADL's;Weight Management/Obesity Improve shortness of breath with ADL's;Weight Management/Obesity   Review Patient is new to the program completing 2 sessions. He was referred to pulmonary rehab for increased SOB secondary to ILD. His personal goals are to return to his ADL's and increase his  energy and strength. Will continue to monitor for progress. Patient has completed 10 sessions maintaining his weight since his initial visit. He is doing very well with progression. He saw his pulmonologist 09/19/20 for a routine follow up. He changed his MDI from [place] to [place]. His personal goals are to return to his ADL's; increase his strength and energy. We will continue to monitor as patient works toward meeting these goals. Patient has completed 15 sessions and also noted some weight gain since his initial visit 3#. He is doing very well with SOB and continues to work hard in [place].  His personal goals are to return to his ADL's; increase his strength and energy. We will continue to monitor as patient works toward meeting these goals. Patient has completed 23 sessions and also noted some weight gain since his initial visit 3#. He is doing very well with SOB and continues to work hard in [place].  His personal goals are to return to his ADL's; increase his strength and energy. We will continue to monitor as patient works toward meeting these goals. Patient has completed 32 sessions.  He is doing very well with SOB and continues to work hard in [place]. Remain on O2 at 2L with no respiratory changes. His personal goals are to return to his ADL's; increase his strength and energy. We will continue to monitor as patient works toward meeting these goals.   Expected Outcomes Patient will complete the program meeting both personal and program goals. Patient will complete the program meeting both personal and program goals. Patient will complete the program meeting both personal and program goals. Patient will complete the program meeting both personal and program goals. Patient will complete the program meeting both personal and program goals.          Core Components/Risk Factors/Patient Goals at Discharge (Final Review):   Goals and Risk Factor Review - 12/02/20 1556      Core Components/Risk  Factors/Patient Goals Review   Personal Goals Review Improve shortness of breath with ADL's;Weight Management/Obesity    Review Patient has completed 32 sessions.  He is doing very well with SOB and continues to work hard in [place]. Remain on O2 at 2L with no respiratory changes. His personal goals are to return to his ADL's; increase his strength and energy. We will continue to monitor as patient works toward meeting these goals.    Expected Outcomes Patient will  complete the program meeting both personal and program goals.           ITP Comments:   Comments: ITP REVIEW Pt is making expected progress toward pulmonary rehab goals after completing 32 sessions. Recommend continued exercise, life style modification, education, and utilization of breathing techniques to increase stamina and strength and decrease shortness of breath with exertion.

## 2020-12-11 ENCOUNTER — Other Ambulatory Visit: Payer: Self-pay

## 2020-12-11 ENCOUNTER — Encounter (HOSPITAL_COMMUNITY)
Admission: RE | Admit: 2020-12-11 | Discharge: 2020-12-11 | Disposition: A | Discharge status: Home / Self Care | Payer: Medicaid Other | Source: Ambulatory Visit | Attending: Pulmonary Disease | Admitting: Pulmonary Disease

## 2020-12-11 VITALS — Wt 298.5 lb

## 2020-12-11 DIAGNOSIS — J849 Interstitial pulmonary disease, unspecified: Secondary | ICD-10-CM

## 2020-12-11 NOTE — Progress Notes (Signed)
Daily Session Note  Patient Details  Name: Mathew Lara MRN: [id number] Date of Birth: [birth year] Referring Provider:   Flowsheet Row PULMONARY REHAB OTHER RESP ORIENTATION from 08/07/2020 in [place]  Referring Provider Dr. Vaughan Browner       Encounter Date: 12/11/2020  Check In:  Session Check In - 12/11/20 1330      Check-In   Supervising physician immediately available to respond to emergencies [facility] MD immediately available    Physician(s) Dr. Harl Bowie    Location [facility]    Staff Present Aundra Dubin, RN, BSN;Madison Audria Nine, MS, Exercise Physiologist;Phyllis Billingsley, RN    Virtual Visit No    Medication changes reported     No    Fall or balance concerns reported    No    Tobacco Cessation No Change    Warm-up and Cool-down Performed as group-led instruction    Resistance Training Performed Yes    VAD Patient? No    PAD/SET Patient? No      Pain Assessment   Currently in Pain? Yes    Pain Score 5     Pain Location Back    Pain Orientation Lower    Pain Descriptors / Indicators Constant    Pain Type Chronic pain    Pain Onset More than a month ago    Aggravating Factors  Weather    Pain Relieving Factors oxycodone    Effect of Pain on Daily Activities affects mobility at times    Multiple Pain Sites No           Capillary Blood Glucose: No results found for this or any previous visit (from the past 24 hour(s)).    Social History   Tobacco Use  Smoking Status Former Smoker  . Packs/day: 1.00  . Years: 42.00  . Pack years: 42.00  . Types: Cigarettes  . Start date: 09/07/1971  . Quit date: 05/28/2014  . Years since quitting: 6.5  Smokeless Tobacco Never Used  Tobacco Comment   quit in November 2015 after smoking x 20 yrs.    Goals Met:  Proper associated with RPD/PD & O2 Sat Independence with exercise equipment Improved SOB with ADL's Using PLB without cueing & demonstrates good technique Exercise  tolerated well No report of cardiac concerns or symptoms Strength training completed today  Goals Unmet:  Not Applicable  Comments: Check out 1430.   Dr. Kathie Dike is Medical Director for [facility].

## 2020-12-16 NOTE — Progress Notes (Signed)
Discharge Progress Report  Patient Details  Name: TALIS IWAN MRN: [id number] Date of Birth: Dec 03, 1960 Referring Provider:   Flowsheet Row PULMONARY REHAB OTHER RESP ORIENTATION from 08/07/2020 in [place]  Referring Provider Dr. Vaughan Browner        Number of Visits: 36  Reason for Discharge:  Patient reached a stable level of exercise. Patient independent in their exercise. Patient has met program and personal goals.  Smoking History:  Social History   Tobacco Use  Smoking Status Former Smoker  . Packs/day: 1.00  . Years: 42.00  . Pack years: 42.00  . Types: Cigarettes  . Start date: 09/07/1971  . Quit date: 05/28/2014  . Years since quitting: 6.5  Smokeless Tobacco Never Used  Tobacco Comment   quit in November 2015 after smoking x 20 yrs.    Diagnosis:  Interstitial lung disease ([place])  ADL UCSD:  Pulmonary Assessment Scores    Row Name 08/07/20 1257         ADL UCSD   ADL Phase Entry     SOB Score total 76           CAT Score   CAT Score 28           mMRC Score   mMRC Score 2            Initial Exercise Prescription:  Initial Exercise Prescription - 08/07/20 1500      Date of Initial Exercise RX and Referring Provider   Date 08/07/20    Referring Provider Dr. Vaughan Browner     Expected Discharge Date 12/11/20      Oxygen   Oxygen Continuous    Liters 2      NuStep   Level 1    SPM 80    Minutes 17      Arm Ergometer   Level 1    RPM 60    Minutes 22      Prescription Details   Frequency (times per week) 2    Duration Progress to 30 minutes of continuous aerobic without signs/symptoms of physical distress      Intensity   THRR 40-80% of Max Heartrate 64-128    Ratings of Perceived Exertion 11-13    Perceived Dyspnea 0-4      Resistance Training   Training Prescription Yes    Weight 3 lbs    Reps 10-15           Discharge Exercise Prescription (Final Exercise Prescription Changes):  Exercise  Prescription Changes - 12/09/20 1500      Response to Exercise   Blood Pressure (Admit) 118/78    Blood Pressure (Exercise) 142/84    Blood Pressure (Exit) 108/60    Heart Rate (Admit) 98 bpm    Heart Rate (Exercise) 130 bpm    Heart Rate (Exit) 112 bpm    Oxygen Saturation (Admit) 93 %    Oxygen Saturation (Exercise) 94 %    Oxygen Saturation (Exit) 95 %    Rating of Perceived Exertion (Exercise) 11    Perceived Dyspnea (Exercise) 11    Duration Continue with 30 min of aerobic exercise without signs/symptoms of physical distress.    Intensity THRR unchanged      Progression   Progression Continue to progress workloads to maintain intensity without signs/symptoms of physical distress.      Resistance Training   Training Prescription Yes    Weight 5 lbs    Reps 10-15    Time 10  Minutes      Oxygen   Oxygen Continuous    Liters 2      NuStep   Level 4    SPM 100    Minutes 17    METs 2.4      Arm Ergometer   Level 4    RPM 41    Minutes 22    METs 2           Functional Capacity:  6 Minute Walk    Row Name 08/07/20 1546 12/11/20 1411       6 Minute Walk   Phase Initial Discharge    Distance 900 feet 1100 feet    Walk Time 6 minutes 6 minutes    # of Rest Breaks 1 0    MPH 1.71 2.1    METS 2.67 2.48    RPE 10 12    Perceived Dyspnea  14 11    VO2 Peak 9.36 8.68    Symptoms Yes (comment) No    Comments Stopped for ~30 sec due to desaturation to put oxygen on --    Resting HR 104 bpm 104 bpm    Resting BP 112/70 132/80    Resting Oxygen Saturation  93 % 98 %    Exercise Oxygen Saturation  during 6 min walk 85 % 91 %    Max Ex. HR 141 bpm 135 bpm    Max Ex. BP 184/68 140/78    2 Minute Post BP 144/72 134/82         Interval HR   1 Minute HR 110 132    2 Minute HR 125 125    3 Minute HR 129 129    4 Minute HR 133 130    5 Minute HR 133 132    6 Minute HR 141 135    2 Minute Post HR 126 114    Interval Heart Rate? Yes Yes         Interval  Oxygen   Interval Oxygen? Yes Yes    Baseline Oxygen Saturation % 93 % 98 %    1 Minute Oxygen Saturation % 85 % 92 %    1 Minute Liters of Oxygen 0 L 2 L    2 Minute Oxygen Saturation % 91 % 91 %    2 Minute Liters of Oxygen 2 L 2 L    3 Minute Oxygen Saturation % 90 % 91 %    3 Minute Liters of Oxygen 2 L 2 L    4 Minute Oxygen Saturation % 88 % 91 %    4 Minute Liters of Oxygen 2 L 2 L    5 Minute Oxygen Saturation % 88 % 91 %    5 Minute Liters of Oxygen 2 L 2 L    6 Minute Oxygen Saturation % 87 % 91 %    6 Minute Liters of Oxygen 2 L 2 L    2 Minute Post Oxygen Saturation % 97 % 98 %    2 Minute Post Liters of Oxygen 2 L 2 L           Psychological, QOL, Others - Outcomes: PHQ 2/9: Depression screen [facility] 2/9 12/16/2020 08/07/2020  Decreased Interest 2 1  Down, Depressed, Hopeless 1 1  PHQ - 2 Score 3 2  Altered sleeping 2 0  Tired, decreased energy 2 3  Change in appetite 2 0  Feeling bad or failure about yourself  2 1  Trouble concentrating  2 0  Moving slowly or fidgety/restless 2 1  Suicidal thoughts 2 0  PHQ-9 Score 17 7  Difficult doing work/chores Not difficult at all Not difficult at all    Quality of Life:  Quality of Life - 12/16/20 1018      Quality of Life   Select Quality of Life      Quality of Life Scores   Health/Function Pre 11.14 %    Health/Function Post 2.63 %    Health/Function % Change -76.39 %    Socioeconomic Pre 13.5 %    Psych/Spiritual Pre 18.86 %    Psych/Spiritual Post 12.86 %    Psych/Spiritual % Change -31.81 %    Family Pre 24 %    Family Post 19.2 %    Family % Change -20 %    GLOBAL Pre 15.18 %    GLOBAL Post 7.67 %    GLOBAL % Change -49.47 %           Personal Goals: Goals established at orientation with interventions provided to work toward goal.  Personal Goals and Risk Factors at Admission - 08/07/20 1247      Core Components/Risk Factors/Patient Goals on Admission    Weight Management Yes;Obesity     Intervention Weight Management: Develop a combined nutrition and exercise program designed to reach desired caloric intake, while maintaining appropriate intake of nutrient and fiber, sodium and fats, and appropriate energy expenditure required for the weight goal.;Weight Management: Provide education and appropriate resources to help participant work on and attain dietary goals.;Weight Management/Obesity: Establish reasonable short term and long term weight goals.;Obesity: Provide education and appropriate resources to help participant work on and attain dietary goals.    Admit Weight 294 lb (133.4 kg)    Goal Weight: Short Term 274 lb (124.3 kg)    Goal Weight: Long Term 250 lb (113.4 kg)    Expected Outcomes Short Term: Continue to assess and modify interventions until short term weight is achieved;Long Term: Adherence to nutrition and physical activity/exercise program aimed toward attainment of established weight goal;Weight Maintenance: Understanding of the daily nutrition guidelines, which includes 25-35% calories from fat, 7% or less cal from saturated fats, less than 284m cholesterol, less than 1.5gm of sodium, & 5 or more servings of fruits and vegetables daily;Weight Loss: Understanding of general recommendations for a balanced deficit meal plan, which promotes 1-2 lb weight loss per week and includes a negative energy balance of [phone] kcal/d;Understanding recommendations for meals to include 15-35% energy as protein, 25-35% energy from fat, 35-60% energy from carbohydrates, less than 2051mof dietary cholesterol, 20-35 gm of total fiber daily;Understanding of distribution of calorie intake throughout the day with the consumption of 4-5 meals/snacks;Weight Gain: Understanding of general recommendations for a high calorie, high protein meal plan that promotes weight gain by distributing calorie intake throughout the day with the consumption for 4-5 meals, snacks, and/or supplements    Improve  shortness of breath with ADL's Yes    Intervention Provide education, individualized exercise plan and daily activity instruction to help decrease symptoms of SOB with activities of daily living.    Expected Outcomes Short Term: Improve cardiorespiratory fitness to achieve a reduction of symptoms when performing ADLs;Long Term: Be able to perform more ADLs without symptoms or delay the onset of symptoms            Personal Goals Discharge:  Goals and Risk Factor Review    Row Name 08/18/20 [id number]/07/22 0954 10/03/20 1206 11/03/20  1429 12/02/20 1556     Core Components/Risk Factors/Patient Goals Review   Personal Goals Review Improve shortness of breath with ADL's;Weight Management/Obesity Improve shortness of breath with ADL's;Weight Management/Obesity Improve shortness of breath with ADL's;Weight Management/Obesity Improve shortness of breath with ADL's;Weight Management/Obesity Improve shortness of breath with ADL's;Weight Management/Obesity   Review Patient is new to the program completing 2 sessions. He was referred to pulmonary rehab for increased SOB secondary to ILD. His personal goals are to return to his ADL's and increase his energy and strength. Will continue to monitor for progress. Patient has completed 10 sessions maintaining his weight since his initial visit. He is doing very well with progression. He saw his pulmonologist 09/19/20 for a routine follow up. He changed his MDI from [place] to [place]. His personal goals are to return to his ADL's; increase his strength and energy. We will continue to monitor as patient works toward meeting these goals. Patient has completed 15 sessions and also noted some weight gain since his initial visit 3#. He is doing very well with SOB and continues to work hard in [place].  His personal goals are to return to his ADL's; increase his strength and energy. We will continue to monitor as patient works toward meeting these goals. Patient has  completed 23 sessions and also noted some weight gain since his initial visit 3#. He is doing very well with SOB and continues to work hard in [place].  His personal goals are to return to his ADL's; increase his strength and energy. We will continue to monitor as patient works toward meeting these goals. Patient has completed 32 sessions.  He is doing very well with SOB and continues to work hard in [place]. Remain on O2 at 2L with no respiratory changes. His personal goals are to return to his ADL's; increase his strength and energy. We will continue to monitor as patient works toward meeting these goals.   Expected Outcomes Patient will complete the program meeting both personal and program goals. Patient will complete the program meeting both personal and program goals. Patient will complete the program meeting both personal and program goals. Patient will complete the program meeting both personal and program goals. Patient will complete the program meeting both personal and program goals.   [place] Name 12/16/20 0840             Core Components/Risk Factors/Patient Goals Review   Personal Goals Review Improve shortness of breath with ADL's;Weight Management/Obesity       Review Patient has completed 36 sessions. He says that his SOB is improving with continuing exercise. He says that ADLs are easier since he started the program. Since he is graduating the program, he says he is now committed to losing weight. His goal is to start with 5 lbs. In the program, he has gained about 7 lbs. He has gotten stronger and his stamina and energy have improved.              Exercise Goals and Review:  Exercise Goals    Row Name 08/07/20 1550 08/19/20 1454 09/16/20 1525 10/14/20 0753 11/11/20 1514     Exercise Goals   Increase Physical Activity Yes Yes Yes Yes Yes   Intervention Provide advice, education, support and counseling about physical activity/exercise needs.;Develop an individualized exercise prescription  for aerobic and resistive training based on initial evaluation findings, risk stratification, comorbidities and participant's personal goals. Provide advice, education, support and counseling about physical activity/exercise needs.;Develop an individualized  exercise prescription for aerobic and resistive training based on initial evaluation findings, risk stratification, comorbidities and participant's personal goals. Provide advice, education, support and counseling about physical activity/exercise needs.;Develop an individualized exercise prescription for aerobic and resistive training based on initial evaluation findings, risk stratification, comorbidities and participant's personal goals. Provide advice, education, support and counseling about physical activity/exercise needs.;Develop an individualized exercise prescription for aerobic and resistive training based on initial evaluation findings, risk stratification, comorbidities and participant's personal goals. Provide advice, education, support and counseling about physical activity/exercise needs.;Develop an individualized exercise prescription for aerobic and resistive training based on initial evaluation findings, risk stratification, comorbidities and participant's personal goals.   Expected Outcomes Short Term: Attend rehab on a regular basis to increase amount of physical activity.;Long Term: Add in home exercise to make exercise part of routine and to increase amount of physical activity.;Long Term: Exercising regularly at least 3-5 days a week. Short Term: Attend rehab on a regular basis to increase amount of physical activity.;Long Term: Add in home exercise to make exercise part of routine and to increase amount of physical activity.;Long Term: Exercising regularly at least 3-5 days a week. Short Term: Attend rehab on a regular basis to increase amount of physical activity.;Long Term: Add in home exercise to make exercise part of routine and to  increase amount of physical activity.;Long Term: Exercising regularly at least 3-5 days a week. Short Term: Attend rehab on a regular basis to increase amount of physical activity.;Long Term: Add in home exercise to make exercise part of routine and to increase amount of physical activity.;Long Term: Exercising regularly at least 3-5 days a week. Short Term: Attend rehab on a regular basis to increase amount of physical activity.;Long Term: Add in home exercise to make exercise part of routine and to increase amount of physical activity.;Long Term: Exercising regularly at least 3-5 days a week.   Increase Strength and Stamina Yes Yes Yes Yes Yes   Intervention Provide advice, education, support and counseling about physical activity/exercise needs.;Develop an individualized exercise prescription for aerobic and resistive training based on initial evaluation findings, risk stratification, comorbidities and participant's personal goals. Provide advice, education, support and counseling about physical activity/exercise needs.;Develop an individualized exercise prescription for aerobic and resistive training based on initial evaluation findings, risk stratification, comorbidities and participant's personal goals. Provide advice, education, support and counseling about physical activity/exercise needs.;Develop an individualized exercise prescription for aerobic and resistive training based on initial evaluation findings, risk stratification, comorbidities and participant's personal goals. Provide advice, education, support and counseling about physical activity/exercise needs.;Develop an individualized exercise prescription for aerobic and resistive training based on initial evaluation findings, risk stratification, comorbidities and participant's personal goals. Provide advice, education, support and counseling about physical activity/exercise needs.;Develop an individualized exercise prescription for aerobic and  resistive training based on initial evaluation findings, risk stratification, comorbidities and participant's personal goals.   Expected Outcomes Short Term: Increase workloads from initial exercise prescription for resistance, speed, and METs.;Short Term: Perform resistance training exercises routinely during rehab and add in resistance training at home;Long Term: Improve cardiorespiratory fitness, muscular endurance and strength as measured by increased METs and functional capacity (6MWT) Short Term: Increase workloads from initial exercise prescription for resistance, speed, and METs.;Short Term: Perform resistance training exercises routinely during rehab and add in resistance training at home;Long Term: Improve cardiorespiratory fitness, muscular endurance and strength as measured by increased METs and functional capacity (6MWT) Short Term: Increase workloads from initial exercise prescription for resistance, speed, and METs.;Short Term:  Perform resistance training exercises routinely during rehab and add in resistance training at home;Long Term: Improve cardiorespiratory fitness, muscular endurance and strength as measured by increased METs and functional capacity (6MWT) Short Term: Increase workloads from initial exercise prescription for resistance, speed, and METs.;Short Term: Perform resistance training exercises routinely during rehab and add in resistance training at home;Long Term: Improve cardiorespiratory fitness, muscular endurance and strength as measured by increased METs and functional capacity (6MWT) Short Term: Increase workloads from initial exercise prescription for resistance, speed, and METs.;Short Term: Perform resistance training exercises routinely during rehab and add in resistance training at home;Long Term: Improve cardiorespiratory fitness, muscular endurance and strength as measured by increased METs and functional capacity (6MWT)   Able to understand and use rate of perceived  exertion (RPE) scale Yes Yes Yes Yes Yes   Intervention Provide education and explanation on how to use RPE scale Provide education and explanation on how to use RPE scale Provide education and explanation on how to use RPE scale Provide education and explanation on how to use RPE scale Provide education and explanation on how to use RPE scale   Expected Outcomes Short Term: Able to use RPE daily in rehab to express subjective intensity level;Long Term:  Able to use RPE to guide intensity level when exercising independently Short Term: Able to use RPE daily in rehab to express subjective intensity level;Long Term:  Able to use RPE to guide intensity level when exercising independently Short Term: Able to use RPE daily in rehab to express subjective intensity level;Long Term:  Able to use RPE to guide intensity level when exercising independently Short Term: Able to use RPE daily in rehab to express subjective intensity level;Long Term:  Able to use RPE to guide intensity level when exercising independently Short Term: Able to use RPE daily in rehab to express subjective intensity level;Long Term:  Able to use RPE to guide intensity level when exercising independently   Able to understand and use Dyspnea scale Yes Yes Yes Yes Yes   Intervention Provide education and explanation on how to use Dyspnea scale Provide education and explanation on how to use Dyspnea scale Provide education and explanation on how to use Dyspnea scale Provide education and explanation on how to use Dyspnea scale Provide education and explanation on how to use Dyspnea scale   Expected Outcomes Short Term: Able to use Dyspnea scale daily in rehab to express subjective sense of shortness of breath during exertion;Long Term: Able to use Dyspnea scale to guide intensity level when exercising independently Short Term: Able to use Dyspnea scale daily in rehab to express subjective sense of shortness of breath during exertion;Long Term: Able to  use Dyspnea scale to guide intensity level when exercising independently Short Term: Able to use Dyspnea scale daily in rehab to express subjective sense of shortness of breath during exertion;Long Term: Able to use Dyspnea scale to guide intensity level when exercising independently Short Term: Able to use Dyspnea scale daily in rehab to express subjective sense of shortness of breath during exertion;Long Term: Able to use Dyspnea scale to guide intensity level when exercising independently Short Term: Able to use Dyspnea scale daily in rehab to express subjective sense of shortness of breath during exertion;Long Term: Able to use Dyspnea scale to guide intensity level when exercising independently   Knowledge and understanding of Target Heart Rate Range (THRR) Yes Yes Yes Yes Yes   Intervention Provide education and explanation of THRR including how the numbers were predicted  and where they are located for reference Provide education and explanation of THRR including how the numbers were predicted and where they are located for reference Provide education and explanation of THRR including how the numbers were predicted and where they are located for reference Provide education and explanation of THRR including how the numbers were predicted and where they are located for reference --   Expected Outcomes Short Term: Able to state/look up THRR;Long Term: Able to use THRR to govern intensity when exercising independently;Short Term: Able to use daily as guideline for intensity in rehab Short Term: Able to state/look up THRR;Long Term: Able to use THRR to govern intensity when exercising independently;Short Term: Able to use daily as guideline for intensity in rehab Short Term: Able to state/look up THRR;Long Term: Able to use THRR to govern intensity when exercising independently;Short Term: Able to use daily as guideline for intensity in rehab Short Term: Able to state/look up THRR;Long Term: Able to use THRR to  govern intensity when exercising independently;Short Term: Able to use daily as guideline for intensity in rehab Short Term: Able to state/look up THRR;Long Term: Able to use THRR to govern intensity when exercising independently;Short Term: Able to use daily as guideline for intensity in rehab   Understanding of Exercise Prescription Yes Yes Yes Yes Yes   Intervention Provide education, explanation, and written materials on patient's individual exercise prescription Provide education, explanation, and written materials on patient's individual exercise prescription Provide education, explanation, and written materials on patient's individual exercise prescription Provide education, explanation, and written materials on patient's individual exercise prescription Provide education, explanation, and written materials on patient's individual exercise prescription   Expected Outcomes Short Term: Able to explain program exercise prescription;Long Term: Able to explain home exercise prescription to exercise independently Short Term: Able to explain program exercise prescription;Long Term: Able to explain home exercise prescription to exercise independently Short Term: Able to explain program exercise prescription;Long Term: Able to explain home exercise prescription to exercise independently Short Term: Able to explain program exercise prescription;Long Term: Able to explain home exercise prescription to exercise independently Short Term: Able to explain program exercise prescription;Long Term: Able to explain home exercise prescription to exercise independently   [place] Name 12/09/20 1548             Exercise Goals   Increase Physical Activity Yes       Intervention Provide advice, education, support and counseling about physical activity/exercise needs.;Develop an individualized exercise prescription for aerobic and resistive training based on initial evaluation findings, risk stratification, comorbidities and  participant's personal goals.       Expected Outcomes Short Term: Attend rehab on a regular basis to increase amount of physical activity.;Long Term: Add in home exercise to make exercise part of routine and to increase amount of physical activity.;Long Term: Exercising regularly at least 3-5 days a week.       Increase Strength and Stamina Yes       Intervention Provide advice, education, support and counseling about physical activity/exercise needs.;Develop an individualized exercise prescription for aerobic and resistive training based on initial evaluation findings, risk stratification, comorbidities and participant's personal goals.       Expected Outcomes Short Term: Increase workloads from initial exercise prescription for resistance, speed, and METs.;Short Term: Perform resistance training exercises routinely during rehab and add in resistance training at home;Long Term: Improve cardiorespiratory fitness, muscular endurance and strength as measured by increased METs and functional capacity (6MWT)  Able to understand and use rate of perceived exertion (RPE) scale Yes       Intervention Provide education and explanation on how to use RPE scale       Expected Outcomes Short Term: Able to use RPE daily in rehab to express subjective intensity level;Long Term:  Able to use RPE to guide intensity level when exercising independently       Able to understand and use Dyspnea scale Yes       Intervention Provide education and explanation on how to use Dyspnea scale       Expected Outcomes Short Term: Able to use Dyspnea scale daily in rehab to express subjective sense of shortness of breath during exertion;Long Term: Able to use Dyspnea scale to guide intensity level when exercising independently       Knowledge and understanding of Target Heart Rate Range (THRR) Yes       Intervention Provide education and explanation of THRR including how the numbers were predicted and where they are located for  reference       Understanding of Exercise Prescription Yes       Intervention Provide education, explanation, and written materials on patient's individual exercise prescription       Expected Outcomes Short Term: Able to explain program exercise prescription;Long Term: Able to explain home exercise prescription to exercise independently              Exercise Goals Re-Evaluation:  Exercise Goals Re-Evaluation    Row Name 08/19/20 1455 09/16/20 1525 10/14/20 0754 11/11/20 1514 12/09/20 1548     Exercise Goal Re-Evaluation   Exercise Goals Review Increase Physical Activity;Increase Strength and Stamina;Able to understand and use rate of perceived exertion (RPE) scale;Able to understand and use Dyspnea scale;Knowledge and understanding of Target Heart Rate Range (THRR);Understanding of Exercise Prescription Increase Physical Activity;Increase Strength and Stamina;Able to understand and use rate of perceived exertion (RPE) scale;Able to understand and use Dyspnea scale;Knowledge and understanding of Target Heart Rate Range (THRR);Understanding of Exercise Prescription Increase Physical Activity;Increase Strength and Stamina;Able to understand and use rate of perceived exertion (RPE) scale;Able to understand and use Dyspnea scale;Knowledge and understanding of Target Heart Rate Range (THRR);Understanding of Exercise Prescription Increase Physical Activity;Increase Strength and Stamina;Able to understand and use rate of perceived exertion (RPE) scale;Able to understand and use Dyspnea scale;Knowledge and understanding of Target Heart Rate Range (THRR);Understanding of Exercise Prescription Increase Physical Activity;Increase Strength and Stamina;Able to understand and use rate of perceived exertion (RPE) scale;Able to understand and use Dyspnea scale;Knowledge and understanding of Target Heart Rate Range (THRR);Understanding of Exercise Prescription   Comments Pt has attended 3 exercise sessions. He  maintains a positive attitude and gives good effort. He is currently exercising at 1.9 METs on the stepper. Will continue to monitor and progress as able. Pt has attended 11 exercise sessions. He reports that he feels like he is getting stronger and is able to do his ADL's with less shortness of breath. He currently exercises at 2.1 METs on the stepper. Will continue to monitor and progress as able. Patient has completed 18 exercise sessions. He is progressing exercise well. He has started exercising at home regularly and reports that he feels it is helping him progress. He is getting stronger and is increasing his intensities on both aerobic training and resistance training. He is currently exercising at 2.1 METs on the NuStep. Will continue to monitor and progress as able. Patient has comleted 26 pulmonary rehab sessions. He is progressing  well and continues to increase his workloads. He reports that he feels much stronger now and is continuing to exercise at home as well. He currently is exercising at 2.4 METs on the stepper. Will continue to monitor and progress as able. Pt has completed 34 rehab sessions. He continues to increase his workloads and exercise at home. He will be graduating the program at the end of the week and is unsure where he will continue his exercise, but he has been encouraged to continue it somewhere. He currently exercises at 2.4 METs on the stepper. Will continue to monitor and progress as able.   Expected Outcomes Through exercise at rehab and by engaging in a home exercise program, the patient will meet their stated goals. Through exercise at rehab and by engaging in a home exercise program, the patient will meet their stated goals. Through exercise at rehab and by engaging in a home exercise program, the patient will meet their stated goals. Through exercise at rehab and by engaging in a home exercise program, the patient will meet their stated goals. Through exercise at rehab and by  engaging in a home exercise program, the patient will meet their stated goals.   Tonsina Name 12/16/20 0834             Exercise Goal Re-Evaluation   Comments Patient has completed 36 sessions. He has done very well in the program and has improved since his first day. His 58mt distance increased by 200 feet. He ended exercising in the program at 2.48 METs. He says that exercising has helped him with ADLs at home easier. He is continuing to exercise at home. He is very motivated to start losing more weight. He was great to work with.              Nutrition & Weight - Outcomes:  Pre Biometrics - 12/11/20 1414      Pre Biometrics   Weight 135.4 kg    Waist Circumference 53.5 inches    Hip Circumference 52 inches    Waist to Hip Ratio 1.03 %    Triceps Skinfold 13 mm    % Body Fat 39.8 %    Grip Strength 22.2 kg    Flexibility 0 in    Single Leg Stand 2 seconds            Nutrition:  Nutrition Therapy & Goals - 12/02/20 1551      Personal Nutrition Goals   Comments We continue to provide nutritional education through handouts. No changes in weight remains the same around 297-299#. Will continue to encourage him to modify diet.      Intervention Plan   Intervention Nutrition handout(s) given to patient.           Nutrition Discharge:  Nutrition Assessments - 12/16/20 1009      MEDFICTS Scores   Pre Score 15    Post Score 72    Score Difference 57           Education Questionnaire Score:  Knowledge Questionnaire Score - 12/16/20 1009      Knowledge Questionnaire Score   Pre Score 5/18    Post Score 16/18          Patient graduated from Pulmonary Rehabilitation today on 12-11-20 after completing 36 sessions. They achieved LTG of 30 minutes of aerobic exercise at Max Met level of 2.48. All patients vitals are WNL. Discharge instruction has been reviewed in detail and patient stated an understanding of  material given. Patient plans to exercise at home. Cardiac  Rehab staff will make f/u call. Patient had no complaints of any abnormal S/S or pain on their exit visit.     Goals reviewed with patient; copy given to patient.

## 2021-01-14 ENCOUNTER — Ambulatory Visit (HOSPITAL_COMMUNITY)
Admission: RE | Admit: 2021-01-14 | Discharge: 2021-01-14 | Disposition: A | Discharge status: Home / Self Care | Payer: Self-pay | Source: Ambulatory Visit | Attending: Pulmonary Disease | Admitting: Pulmonary Disease

## 2021-01-14 ENCOUNTER — Other Ambulatory Visit: Payer: Self-pay

## 2021-01-14 DIAGNOSIS — J849 Interstitial pulmonary disease, unspecified: Secondary | ICD-10-CM | POA: Insufficient documentation

## 2021-01-19 ENCOUNTER — Telehealth: Payer: Self-pay | Admitting: Pulmonary Disease

## 2021-01-19 NOTE — Telephone Encounter (Signed)
Called and spoke with patient who was retuning phone call to go over CT results. Went over CT result recommendation per Dr Halford Chessman with patient and scheduled office visit at there [facility] office on Friday 01/30/21 at 11:30am with Dr Halford Chessman. Patient agreeable to time, date and location. Nothing further needed at this time.

## 2021-01-19 NOTE — Progress Notes (Signed)
Spoke with patient and went over Dr Halford Chessman Recommendations with patient. See telephone note from today 01/19/21.

## 2021-01-30 ENCOUNTER — Encounter: Payer: Self-pay | Admitting: Pulmonary Disease

## 2021-01-30 ENCOUNTER — Other Ambulatory Visit: Payer: Self-pay

## 2021-01-30 ENCOUNTER — Ambulatory Visit (INDEPENDENT_AMBULATORY_CARE_PROVIDER_SITE_OTHER): Payer: Medicaid Other | Admitting: Pulmonary Disease

## 2021-01-30 VITALS — BP 158/98 | HR 101 | Temp 98.4°F | Ht 68.0 in | Wt 303.4 lb

## 2021-01-30 DIAGNOSIS — J849 Interstitial pulmonary disease, unspecified: Secondary | ICD-10-CM

## 2021-01-30 DIAGNOSIS — J449 Chronic obstructive pulmonary disease, unspecified: Secondary | ICD-10-CM | POA: Diagnosis not present

## 2021-01-30 DIAGNOSIS — Z8616 Personal history of COVID-19: Secondary | ICD-10-CM

## 2021-01-30 DIAGNOSIS — Z9989 Dependence on other enabling machines and devices: Secondary | ICD-10-CM

## 2021-01-30 DIAGNOSIS — G4733 Obstructive sleep apnea (adult) (pediatric): Secondary | ICD-10-CM

## 2021-01-30 MED ORDER — PROAIR HFA 108 (90 BASE) MCG/ACT IN AERS: 2.0000 | INHALATION_SPRAY | RESPIRATORY_TRACT | 3 refills | Status: DC | PRN

## 2021-01-30 MED ORDER — FLUTICASONE-SALMETEROL 250-50 MCG/ACT IN AEPB
1.0000 | INHALATION_SPRAY | Freq: Two times a day (BID) | RESPIRATORY_TRACT | 11 refills | Status: DC
Start: 2021-01-30 — End: 2022-03-16

## 2021-01-30 NOTE — Progress Notes (Signed)
[facility] [place] [facility]  Chief Complaint  Patient presents with  . Follow-up    Go over CT results    Constitutional:  BP (!) 172/104 (BP Location: Left Arm, Cuff Size: Normal)   Pulse (!) 101   Temp 98.4 F (36.9 C) (Temporal)   Ht 5\' 8"  (1.727 m)   Wt (!) 303 lb 6.4 oz (137.6 kg)   SpO2 96% Comment: Room air  BMI 46.13 kg/m   Past Medical History:  HTN, Hep C, CKD 3b, Chronic pain, GERD, OSA, Anemia, Secondary hyperparathyroidism, Vit D deficiency  Past Surgical History:  He  has a past surgical history that includes Colonoscopy (N/A, 01/23/2014); Esophagogastroduodenoscopy (N/A, 01/23/2014); Hemorrhoid banding (01/23/2014); Mouth surgery; Total hip arthroplasty (Right, 06/04/2016); Esophagogastroduodenoscopy (N/A, 04/18/2018); Joint replacement (Right, 2017); Total hip arthroplasty (Left, 03/02/2019); and Cataract extraction (Right).  Brief Summary:  Mathew Lara is a 60 y.o. male former smoker with COPD, and COVID 10 infection in December 9622 complicated by fibrosis.      Subjective:   He is here with his daughter.  He wasn't able to get advair due to lack of insurance.  This will change in June.  He gets intermittent cough and wheeze.  Winded with activity.    Uses Bipap and oxygen nightly.  CT chest shows stable emphysema and ILD.  Does mention changes of liver cirrhosis which is a new finding.  Physical Exam:   Appearance - well kempt   ENMT - no sinus tenderness, no oral exudate, no LAN, Mallampati 4 airway, no stridor, wears dentures  Respiratory - equal breath sounds bilaterally, no wheezing or rales  CV - s1s2 regular rate and rhythm, no murmurs  Ext - no clubbing, no edema  Skin - no rashes  Psych - normal mood and affect    Pulmonary testing:   PFT 02/12/20 >> FEV1 2.37 (81%), FEV1% 76, TLC 4.98 (75%), DLCO 38%  Chest Imaging:   HRCT chest 01/31/20 >> moderate centrilobular and paraseptal emphysema, minimal  patchy subpleural reticulation and GGO in dependent lung zones  HRCT 01/15/21 >> moderate paraseptal emphysema, no change in ILD (indeterminant of UIP), changes of cirrhosis  Sleep Tests:   PSG 10/30/14 >> AHI 106, SpO2 low 70%; Bipap 15/11 with 1 liter oxygen  Cardiac Tests:   Echo 12/25/19 >> EF 60 to 65%, severe LVH, grade 1 DD  Social History:  He  reports that he quit smoking about 6 years ago. His smoking use included cigarettes. He started smoking about 49 years ago. He has a 42.00 pack-year smoking history. He has never used smokeless tobacco. He reports previous alcohol use. He reports that he does not use drugs.  Family History:  His family history includes Lung cancer in his mother.     Assessment/Plan:   COPD with emphysema. 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 - stable on imaging studies - will plan for follow up HRCT chest in May 2023  Obstructive sleep apnea with nocturnal hypoxemia. - gets supplies through [employer] and refilled by his PCP - on Bipap and 2 liters oxygen at night - he reports compliance with therapy and benefit from therapy  Obesity. - discussed importance of weight loss  Changes of cirrhosis on CT imaging. - his daughter with call gastroenterology to arrange for appointment to assess  further  White coat syndrome. - advised him to monitor his blood pressure at home  CKD 3b. - followed by Dr. Ulice Bold with [facility]  Time Spent Involved in Patient Care on Day of Examination:  24 minutes  Follow up:  Patient Instructions  Fluticasone-salmeterol one puff twice per day and rinse your mouth after each use  Albuterol two puffs every 6 hours as needed for cough, wheeze, chest congestion, or shortness of breath  Call if you need a referral to GI to  assess cirrhosis  Follow up in 4 months   Medication List:   Allergies as of 01/30/2021   No Known Allergies     Medication List       Accurate as of Jan 30, 2021 11:52 AM. If you have any questions, ask your nurse or doctor.        acetaminophen 325 MG tablet Commonly known as: TYLENOL Take 2 tablets (650 mg total) by mouth every 6 (six) hours as needed for mild pain or headache (fever >/= 101).   amLODipine 10 MG tablet Commonly known as: NORVASC Take 10 mg by mouth daily.   ascorbic acid 500 MG tablet Commonly known as: VITAMIN C Take 1 tablet (500 mg total) by mouth daily.   aspirin EC 81 MG tablet Take 81 mg by mouth daily.   DULoxetine 30 MG capsule Commonly known as: CYMBALTA Take 60 mg by mouth daily.   fluticasone-salmeterol 250-50 MCG/ACT Aepb Commonly known as: ADVAIR Inhale 1 puff into the lungs every 12 (twelve) hours. What changed:   medication strength  when to take this Changed by: Chesley Mires, MD   HYDROcodone-acetaminophen 5-325 MG tablet Commonly known as: NORCO/VICODIN Take 1 tablet by mouth every 6 (six) hours as needed for moderate pain.   hydrOXYzine 25 MG tablet Commonly known as: ATARAX/VISTARIL Take 25 mg by mouth 3 (three) times daily as needed for anxiety.   loratadine 10 MG tablet Commonly known as: CLARITIN Take 1 tablet (10 mg total) by mouth daily.   losartan 25 MG tablet Commonly known as: COZAAR Take 25 mg by mouth daily.   oxyCODONE 5 MG immediate release tablet Commonly known as: Oxy IR/ROXICODONE Take 5 mg by mouth every 8 (eight) hours as needed.   ProAir HFA 108 (90 Base) MCG/ACT inhaler Generic drug: albuterol Inhale 2 puffs into the lungs every 4 (four) hours as needed for wheezing or shortness of breath.   sodium bicarbonate 650 MG tablet Take 650 mg by mouth 3 (three) times daily.   zinc sulfate 220 (50 Zn) MG capsule Take 1 capsule (220 mg total) by mouth daily.       Signature:  Chesley Mires,  MD [place] Pager - [phone] 01/30/2021, 11:52 AM

## 2021-01-30 NOTE — Patient Instructions (Signed)
Fluticasone-salmeterol one puff twice per day and rinse your mouth after each use  Albuterol two puffs every 6 hours as needed for cough, wheeze, chest congestion, or shortness of breath  Call if you need a referral to GI to assess cirrhosis  Follow up in 4 months

## 2021-02-03 ENCOUNTER — Other Ambulatory Visit: Payer: Self-pay | Admitting: Gastroenterology

## 2021-02-03 ENCOUNTER — Other Ambulatory Visit: Payer: Self-pay

## 2021-02-03 DIAGNOSIS — K7460 Unspecified cirrhosis of liver: Secondary | ICD-10-CM

## 2021-02-03 NOTE — Progress Notes (Signed)
Almyra Free, I have ordered labs and patient can have done at [facility].  [facility]: Please arrange US abdomen complete due to cirrhosis.  Manuela Schwartz, let's see if I have any openings in next few weeks? If not, I will have to find a place .

## 2021-02-03 NOTE — Progress Notes (Signed)
[place] scheduled for 6/8 at 8:30am, arrival 8:15am, npo midnight  Called, VM not set up, try again later

## 2021-02-03 NOTE — Progress Notes (Signed)
noted 

## 2021-02-03 NOTE — Progress Notes (Signed)
Labs have been released and the pt's daughter is aware.

## 2021-02-03 NOTE — Progress Notes (Signed)
Pts daughter is aware of U/S date and time and to be NPO after midnight.

## 2021-02-04 LAB — COMPREHENSIVE METABOLIC PANEL
ALT: 26 IU/L (ref 0–44)
AST: 25 IU/L (ref 0–40)
Albumin/Globulin Ratio: 1.1 — ABNORMAL LOW (ref 1.2–2.2)
Albumin: 4.6 g/dL (ref 3.8–4.8)
Alkaline Phosphatase: 139 IU/L — ABNORMAL HIGH (ref 44–121)
BUN/Creatinine Ratio: 14 (ref 10–24)
BUN: 28 mg/dL — ABNORMAL HIGH (ref 8–27)
Bilirubin Total: 0.3 mg/dL (ref 0.0–1.2)
CO2: 18 mmol/L — ABNORMAL LOW (ref 20–29)
Calcium: 10.1 mg/dL (ref 8.6–10.2)
Chloride: 99 mmol/L (ref 96–106)
Creatinine, Ser: 2.02 mg/dL — ABNORMAL HIGH (ref 0.76–1.27)
Globulin, Total: 4.2 g/dL (ref 1.5–4.5)
Glucose: 142 mg/dL — ABNORMAL HIGH (ref 65–99)
Potassium: 4.5 mmol/L (ref 3.5–5.2)
Sodium: 136 mmol/L (ref 134–144)
Total Protein: 8.8 g/dL — ABNORMAL HIGH (ref 6.0–8.5)
eGFR: 37 mL/min/{1.73_m2} — ABNORMAL LOW (ref >59)

## 2021-02-04 LAB — CBC WITH DIFFERENTIAL
Basophils Absolute: 0.1 x10E3/uL (ref 0.0–0.2)
Basos: 1 %
EOS (ABSOLUTE): 0.6 x10E3/uL — ABNORMAL HIGH (ref 0.0–0.4)
Eos: 7 %
Hematocrit: 40.8 % (ref 37.5–51.0)
Hemoglobin: 13.6 g/dL (ref 13.0–17.7)
Immature Grans (Abs): 0.1 x10E3/uL (ref 0.0–0.1)
Immature Granulocytes: 1 %
Lymphocytes Absolute: 1.8 x10E3/uL (ref 0.7–3.1)
Lymphs: 20 %
MCH: 27 pg (ref 26.6–33.0)
MCHC: 33.3 g/dL (ref 31.5–35.7)
MCV: 81 fL (ref 79–97)
Monocytes Absolute: 0.9 x10E3/uL (ref 0.1–0.9)
Monocytes: 10 %
Neutrophils Absolute: 5.5 x10E3/uL (ref 1.4–7.0)
Neutrophils: 61 %
RBC: 5.03 x10E6/uL (ref 4.14–5.80)
RDW: 14.8 % (ref 11.6–15.4)
WBC: 8.9 x10E3/uL (ref 3.4–10.8)

## 2021-02-04 LAB — HEPATITIS B SURFACE ANTIBODY,QUALITATIVE: Hep B Surface Ab, Qual: REACTIVE

## 2021-02-04 LAB — HEPATITIS B SURFACE ANTIGEN: Hepatitis B Surface Ag: NEGATIVE

## 2021-02-04 LAB — IRON,TIBC AND FERRITIN PANEL
Ferritin: 202 ng/mL (ref 30–400)
Iron Saturation: 23 % (ref 15–55)
Iron: 65 ug/dL (ref 38–169)
Total Iron Binding Capacity: 281 ug/dL (ref 250–450)
UIBC: 216 ug/dL (ref 111–343)

## 2021-02-04 LAB — AFP TUMOR MARKER: AFP, Serum, Tumor Marker: 4.4 ng/mL (ref 0.0–8.4)

## 2021-02-04 LAB — HEPATITIS A ANTIBODY, TOTAL: hep A Total Ab: POSITIVE — AB

## 2021-02-04 LAB — HCV RNA QUANT: Hepatitis C Quantitation: NOT DETECTED [IU]/mL

## 2021-02-04 LAB — PROTIME-INR
INR: 1 (ref 0.9–1.2)
Prothrombin Time: 10.3 s (ref 9.1–12.0)

## 2021-02-11 ENCOUNTER — Ambulatory Visit (HOSPITAL_COMMUNITY)
Admission: RE | Admit: 2021-02-11 | Discharge: 2021-02-11 | Disposition: A | Discharge status: Home / Self Care | Payer: 59 | Source: Ambulatory Visit | Attending: Gastroenterology | Admitting: Gastroenterology

## 2021-02-11 DIAGNOSIS — K7460 Unspecified cirrhosis of liver: Secondary | ICD-10-CM | POA: Diagnosis present

## 2021-02-13 ENCOUNTER — Telehealth: Payer: Self-pay

## 2021-02-13 NOTE — Telephone Encounter (Signed)
[facility] phoned regarding the pt's U/S report to make sure that the ordering physician see it.

## 2021-02-16 ENCOUNTER — Encounter: Payer: Self-pay | Admitting: Gastroenterology

## 2021-02-16 ENCOUNTER — Other Ambulatory Visit: Payer: Self-pay | Admitting: *Deleted

## 2021-02-16 DIAGNOSIS — K824 Cholesterolosis of gallbladder: Secondary | ICD-10-CM

## 2021-02-16 LAB — SPECIMEN STATUS REPORT

## 2021-02-16 LAB — PLATELET COUNT: Platelets: 268 10*3/uL (ref 150–450)

## 2021-02-24 ENCOUNTER — Encounter: Payer: Self-pay | Admitting: General Surgery

## 2021-02-24 ENCOUNTER — Ambulatory Visit (INDEPENDENT_AMBULATORY_CARE_PROVIDER_SITE_OTHER): Payer: 59 | Admitting: General Surgery

## 2021-02-24 ENCOUNTER — Other Ambulatory Visit: Payer: Self-pay

## 2021-02-24 VITALS — BP 127/84 | HR 100 | Temp 99.5°F | Resp 18 | Ht 68.0 in | Wt 299.0 lb

## 2021-02-24 DIAGNOSIS — K811 Chronic cholecystitis: Secondary | ICD-10-CM | POA: Insufficient documentation

## 2021-02-24 DIAGNOSIS — K824 Cholesterolosis of gallbladder: Secondary | ICD-10-CM | POA: Diagnosis not present

## 2021-02-24 NOTE — Patient Instructions (Signed)
Will review with radiology and give you a call about plans. Will decide risk benefit of repeating versus proceeding with surgery. Will discuss with pulmonary and anesthesia before any plans for surgery.   Minimally Invasive Cholecystectomy Minimally invasive cholecystectomy is surgery to remove the gallbladder. The gallbladder is a pear-shaped organ that lies beneath the liver on the right side of the body. The gallbladder stores bile, which is a fluid that helps the body digest fats. Cholecystectomy is often done to treat inflammation of the gallbladder (cholecystitis). This condition is usually caused by a buildup of gallstones (cholelithiasis) in the gallbladder. Gallstones can block the flow of bile, which can resultin inflammation and pain. In severe cases, emergency surgery may be required. This procedure is done though small incisions in the abdomen, instead of one large incision. It is also called laparoscopic surgery. A thin scope with a camera (laparoscope) is inserted through one incision. Then surgical instruments are inserted through the other incisions. In some cases, a minimally invasive surgery may need to be changed to a surgery that is done through a larger incision. This iscalled open surgery. Tell a health care provider about: Any allergies you have. All medicines you are taking, including vitamins, herbs, eye drops, creams, and over-the-counter medicines. Any problems you or family members have had with anesthetic medicines. Any blood disorders you have. Any surgeries you have had. Any medical conditions you have. Whether you are pregnant or may be pregnant. What are the risks? Generally, this is a safe procedure. However, problems may occur, including: Infection. Bleeding. Allergic reactions to medicines. Damage to nearby structures or organs. A stone remaining in the common bile duct. The common bile duct carries bile from the gallbladder into the small intestine. A bile  leak from the cyst duct that is clipped when your gallbladder is removed. What happens before the procedure? Medicines Ask your health care provider about: Changing or stopping your regular medicines. This is especially important if you are taking diabetes medicines or blood thinners. Taking medicines such as aspirin and ibuprofen. These medicines can thin your blood. Do not take these medicines unless your health care provider tells you to take them. Taking over-the-counter medicines, vitamins, herbs, and supplements. General instructions Let your health care provider know if you develop a cold or an infection before surgery. Plan to have someone take you home from the hospital or clinic. If you will be going home right after the procedure, plan to have someone with you for 24 hours. Ask your health care provider: How your surgery site will be marked. What steps will be taken to help prevent infection. These may include: Removing hair at the surgery site. Washing skin with a germ-killing soap. Taking antibiotic medicine. What happens during the procedure?  An IV will be inserted into one of your veins. You will be given one or both of the following: A medicine to help you relax (sedative). A medicine to make you fall asleep (general anesthetic). A breathing tube will be placed in your mouth. Your surgeon will make several small incisions in your abdomen. The laparoscope will be inserted through one of the small incisions. The camera on the laparoscope will send images to a monitor in the operating room. This lets your surgeon see inside your abdomen. A gas will be pumped into your abdomen. This will expand your abdomen to give the surgeon more room to perform the surgery. Other tools that are needed for the procedure will be inserted through the other  incisions. The gallbladder will be removed through one of the incisions. Your common bile duct may be examined. If stones are found in the  common bile duct, they may be removed. After your gallbladder has been removed, the incisions will be closed with stitches (sutures), staples, or skin glue. Your incisions may be covered with a bandage (dressing). The procedure may vary among health care providers and hospitals. What happens after the procedure? Your blood pressure, heart rate, breathing rate, and blood oxygen level will be monitored until you leave the hospital or clinic. You will be given medicines as needed to control your pain. If you were given a sedative during the procedure, it can affect you for several hours. Do not drive or operate machinery until your health care provider says that it is safe. Summary Minimally invasive cholecystectomy, also called laparoscopic cholecystectomy, is surgery to remove the gallbladder using small incisions. Tell your health care provider about all the medical conditions you have and all the medicines you are taking for those conditions. Before the procedure, follow instructions about eating or drinking restrictions and changing or stopping medicines. If you were given a sedative during the procedure, it can affect you for several hours. Do not drive or operate machinery until your health care provider says that it is safe. This information is not intended to replace advice given to you by your health care provider. Make sure you discuss any questions you have with your healthcare provider. Document Revised: 05/28/2019 Document Reviewed: 05/28/2019 Elsevier Patient Education  [employer].

## 2021-02-24 NOTE — Progress Notes (Signed)
[facility] History and Physical  Reason for Referral: Gallbladder polyp  Referring Physician:  Roseanne Kaufman, NP  Chief Complaint   New Patient (Initial Visit)     Mathew Lara is a 60 y.o. male.  HPI: Mathew Lara is a 60 yo who has a history of COPD, CKD, and a COVID infection that has made his lung disease worse.  He had a CT chest that was done by pulmonary and this showed concern for possible cirrhosis. After the CT chest and [place] was done.  The US demonstrated fatty liver and a gallbladder polyp of 1cm in size.  He denies any abdominal pain or nausea/ vomiting. He says he uses his Bipap at night and gets winded at times.    He is here with his daughter.    Past Medical History:  Diagnosis Date   Alcohol abuse    Anxiety    Bronchitis    Chronic hip pain    CKD (chronic kidney disease) stage 3, GFR 30-59 ml/min (HCC)    COPD (chronic obstructive pulmonary disease) (HCC)    Essential hypertension    Hepatitis C    Mixed hyperlipidemia    Sleep apnea     Past Surgical History:  Procedure Laterality Date   CATARACT EXTRACTION Right    COLONOSCOPY N/A 01/23/2014   SLF:The LEFT COLON IS EXTREMELY redundant/TWO RECTAL POLYPS REMOVED/SMALL VOLUME RECTAL BLEEDING MOST LIKELY DUE TO Small internal hemorrhoids   ESOPHAGOGASTRODUODENOSCOPY N/A 01/23/2014   EXN:[id number] ring at the gastroesophageal junction/Medium sized hiatal hernia/NDYSPEPSIA MOST LIKELY DUE TO GERD/MILD Non-erosive gastritis   ESOPHAGOGASTRODUODENOSCOPY N/A 04/18/2018   Procedure: ESOPHAGOGASTRODUODENOSCOPY (EGD);  Surgeon: Daneil Dolin, MD;  [facility];  Service: Endoscopy;  Laterality: N/A;   HEMORRHOID BANDING  01/23/2014   Procedure: HEMORRHOID BANDING;  Surgeon: Danie Binder, MD;  [facility];  Service: Endoscopy;;   JOINT REPLACEMENT Right 2017   MOUTH SURGERY     TOTAL HIP ARTHROPLASTY Right 06/04/2016   Procedure: RIGHT TOTAL HIP ARTHROPLASTY ANTERIOR  APPROACH;  Surgeon: Mcarthur Rossetti, MD;  Location: [facility];  Service: Orthopedics;  Laterality: Right;   TOTAL HIP ARTHROPLASTY Left 03/02/2019   Procedure: LEFT TOTAL HIP ARTHROPLASTY ANTERIOR APPROACH;  Surgeon: Mcarthur Rossetti, MD;  Location: [facility];  Service: Orthopedics;  Laterality: Left;    Family History  Problem Relation Age of Onset   Lung cancer Mother     Social History   Tobacco Use   Smoking status: Former    Packs/day: 1.00    Years: 42.00    Pack years: 42.00    Types: Cigarettes    Start date: 09/07/1971    Quit date: 05/28/2014    Years since quitting: 6.7   Smokeless tobacco: Never   Tobacco comments:    quit in November 2015 after smoking x 20 yrs.  Vaping Use   Vaping Use: Never used  Substance Use Topics   Alcohol use: Not Currently    Alcohol/week: 0.0 standard drinks   Drug use: No    Medications: I have reviewed the patient's current medications. Allergies as of 02/24/2021   No Known Allergies      Medication List        Accurate as of February 24, 2021 10:32 AM. If you have any questions, ask your nurse or doctor.          acetaminophen 325 MG tablet Commonly known as: TYLENOL Take 2 tablets (650 mg total) by  mouth every 6 (six) hours as needed for mild pain or headache (fever >/= 101).   amLODipine 10 MG tablet Commonly known as: NORVASC Take 10 mg by mouth daily.   ascorbic acid 500 MG tablet Commonly known as: VITAMIN C Take 1 tablet (500 mg total) by mouth daily.   aspirin EC 81 MG tablet Take 81 mg by mouth daily.   DULoxetine 30 MG capsule Commonly known as: CYMBALTA Take 60 mg by mouth daily.   fluticasone-salmeterol 250-50 MCG/ACT Aepb Commonly known as: ADVAIR Inhale 1 puff into the lungs every 12 (twelve) hours.   HYDROcodone-acetaminophen 5-325 MG tablet Commonly known as: NORCO/VICODIN Take 1 tablet by mouth every 6 (six) hours as needed for moderate pain.   hydrOXYzine 25 MG tablet Commonly known  as: ATARAX/VISTARIL Take 25 mg by mouth 3 (three) times daily as needed for anxiety.   loratadine 10 MG tablet Commonly known as: CLARITIN Take 1 tablet (10 mg total) by mouth daily.   losartan 25 MG tablet Commonly known as: COZAAR Take 25 mg by mouth daily.   oxyCODONE 5 MG immediate release tablet Commonly known as: Oxy IR/ROXICODONE Take 5 mg by mouth every 8 (eight) hours as needed.   ProAir HFA 108 (90 Base) MCG/ACT inhaler Generic drug: albuterol Inhale 2 puffs into the lungs every 4 (four) hours as needed for wheezing or shortness of breath.   sodium bicarbonate 650 MG tablet Take 650 mg by mouth 3 (three) times daily.   zinc sulfate 220 (50 Zn) MG capsule Take 1 capsule (220 mg total) by mouth daily.         ROS:  A comprehensive review of systems was negative except for: Constitutional: positive for tired/ sluggish Respiratory: positive for cough, wheezing, and SOB Musculoskeletal: positive for back pain and joint pain  Blood pressure 127/84, pulse 100, temperature 99.5 F (37.5 C), temperature source Other (Comment), resp. rate 18, height 5\' 8"  (1.727 m), weight 299 lb (135.6 kg), SpO2 91 %. Physical Exam Vitals reviewed.  Constitutional:      Appearance: Normal appearance.  HENT:     Head: Normocephalic.     Nose: Nose normal.     Mouth/Throat:     Mouth: Mucous membranes are moist.  Eyes:     Extraocular Movements: Extraocular movements intact.  Cardiovascular:     Rate and Rhythm: Normal rate and regular rhythm.  Pulmonary:     Effort: Pulmonary effort is normal.     Breath sounds: Normal breath sounds.  Abdominal:     General: There is no distension.     Palpations: Abdomen is soft.     Tenderness: There is no abdominal tenderness.  Musculoskeletal:        General: No swelling. Normal range of motion.     Cervical back: Normal range of motion.  Skin:    General: Skin is warm.  Neurological:     General: No focal deficit present.      Mental Status: He is alert. Mental status is at baseline. He is disoriented.  Psychiatric:        Mood and Affect: Mood normal.        Behavior: Behavior normal.        Thought Content: Thought content normal.        Judgment: Judgment normal.    Results:  CLINICAL DATA:  Cirrhosis   EXAM: ABDOMEN ULTRASOUND COMPLETE   COMPARISON:  Ultrasound 03/26/2015 renal ultrasound 09/19/2019   FINDINGS: Gallbladder: No shadowing stone.  Hypoechoic nodule at the gallbladder fundus measuring up to 1 cm. Normal wall thickness. No sonographic Murphy. Small amount of gallbladder sludge.   Common bile duct: Diameter: 3.6 mm   Liver: Liver is echogenic. No focal hepatic abnormality is seen. There may be subtle surface nodularity. Portal vein is patent on color Doppler imaging with normal direction of blood flow towards the liver.   IVC: No abnormality visualized.   Pancreas: Visualized portion unremarkable.   Spleen: Upper normal in size   Right Kidney: Length: 11.2 cm. Cyst in the upper pole measuring 1.9 cm   Left Kidney: Length: 12.1 cm. Cyst containing scattered echoes at the upper pole measuring 1.8 cm, previously 1.4 cm.   Abdominal aorta: No aneurysm visualized.   Other findings: None.   IMPRESSION: 1. Echogenic liver consistent with hepatic steatosis and or hepatocellular disease. Negative for hepatic mass lesion by sonography. 2. Suspected gallbladder polyp or small mass at the fundus measuring up to 1 cm. Surgical consultation is recommended.   These results will be called to the ordering clinician or representative by the Radiologist Assistant, and communication documented in the PACS or [employer].     Electronically Signed   By: Donavan Foil M.D.   On: 02/12/2021 19:43  Assessment & Plan:  MUAZ SHOREY is a 60 y.o. male with a gallbladder polyp that is 1cm in size. I reviewed the imaging with Dr. Thornton Papas and discussed that this definitely a polyp and  recommendations would be for surgery.   PLAN: I counseled the patient about the indication, risks and benefits of laparoscopic cholecystectomy.  He understands there is a very small chance for bleeding, infection, injury to normal structures (including common bile duct), conversion to open surgery, persistent symptoms, evolution of postcholecystectomy diarrhea, need for secondary interventions, anesthesia reaction, cardiopulmonary issues and other risks not specifically detailed here. I described the expected recovery, the plan for follow-up and the restrictions during the recovery phase.  All questions were answered.  Discussed liver biopsy given the fatty liver disease and additional risk of bleeding.  I discussed with patient and [name] his daughter the polyp and his respiratory disease.  We discussed that I will discuss things further with Dr. Halford Chessman, Pulmonary and anesthesia.  Discussed that if he had surgery at [facility] he would likely need to be admitted overnight for monitoring.   All questions were answered to the satisfaction of the patient and family.   [name] 02/24/2021, 10:32 AM

## 2021-03-05 ENCOUNTER — Telehealth (INDEPENDENT_AMBULATORY_CARE_PROVIDER_SITE_OTHER): Payer: 59 | Admitting: General Surgery

## 2021-03-05 DIAGNOSIS — K760 Fatty (change of) liver, not elsewhere classified: Secondary | ICD-10-CM

## 2021-03-05 DIAGNOSIS — K824 Cholesterolosis of gallbladder: Secondary | ICD-10-CM

## 2021-03-05 NOTE — Telephone Encounter (Signed)
[facility]  Discussed with Dr. Halford Chessman. Intermediate risk from a pulmonary standpoint. Recommends post operative admission. Would plan for a Monday for surgery and get pulmonary to see him the next day.  Camille in a agreement. Plan for July 18 for lap cholecystectomy and liver biopsy.   Curlene Labrum, MD [facility] [address] [place], [place] [address] [phone] (office)

## 2021-03-11 NOTE — H&P (Signed)
[facility] History and Physical   Reason for Referral: Gallbladder polyp  Referring Physician:  Roseanne Kaufman, NP   Chief Complaint   New Patient (Initial Visit)        Mathew Lara is a 60 y.o. male. HPI: Ms. Greenleaf is a 60 yo who has a history of COPD, CKD, and a COVID infection that has made his lung disease worse.  He had a CT chest that was done by pulmonary and this showed concern for possible cirrhosis. After the CT chest and [place] was done.  The US demonstrated fatty liver and a gallbladder polyp of 1cm in size.  He denies any abdominal pain or nausea/ vomiting. He says he uses his Bipap at night and gets winded at times.     He is here with his daughter.         Past Medical History:  Diagnosis Date   Alcohol abuse     Anxiety     Bronchitis     Chronic hip pain     CKD (chronic kidney disease) stage 3, GFR 30-59 ml/min (HCC)     COPD (chronic obstructive pulmonary disease) (HCC)     Essential hypertension     Hepatitis C     Mixed hyperlipidemia     Sleep apnea             Past Surgical History:  Procedure Laterality Date   CATARACT EXTRACTION Right     COLONOSCOPY N/A 01/23/2014    SLF:The LEFT COLON IS EXTREMELY redundant/TWO RECTAL POLYPS REMOVED/SMALL VOLUME RECTAL BLEEDING MOST LIKELY DUE TO Small internal hemorrhoids   ESOPHAGOGASTRODUODENOSCOPY N/A 01/23/2014    GUR:[id number] ring at the gastroesophageal junction/Medium sized hiatal hernia/NDYSPEPSIA MOST LIKELY DUE TO GERD/MILD Non-erosive gastritis   ESOPHAGOGASTRODUODENOSCOPY N/A 04/18/2018    Procedure: ESOPHAGOGASTRODUODENOSCOPY (EGD);  Surgeon: Daneil Dolin, MD;  [facility];  Service: Endoscopy;  Laterality: N/A;   HEMORRHOID BANDING   01/23/2014    Procedure: HEMORRHOID BANDING;  Surgeon: Danie Binder, MD;  [facility];  Service: Endoscopy;;   JOINT REPLACEMENT Right 2017   MOUTH SURGERY       TOTAL HIP ARTHROPLASTY Right 06/04/2016    Procedure: RIGHT  TOTAL HIP ARTHROPLASTY ANTERIOR APPROACH;  Surgeon: Mcarthur Rossetti, MD;  Location: [facility];  Service: Orthopedics;  Laterality: Right;   TOTAL HIP ARTHROPLASTY Left 03/02/2019    Procedure: LEFT TOTAL HIP ARTHROPLASTY ANTERIOR APPROACH;  Surgeon: Mcarthur Rossetti, MD;  Location: [facility];  Service: Orthopedics;  Laterality: Left;           Family History  Problem Relation Age of Onset   Lung cancer Mother        Social History         Tobacco Use   Smoking status: Former      Packs/day: 1.00      Years: 42.00      Pack years: 42.00      Types: Cigarettes      Start date: 09/07/1971      Quit date: 05/28/2014      Years since quitting: 6.7   Smokeless tobacco: Never   Tobacco comments:      quit in November 2015 after smoking x 20 yrs.  Vaping Use   Vaping Use: Never used  Substance Use Topics   Alcohol use: Not Currently      Alcohol/week: 0.0 standard drinks   Drug use: No  Medications: I have reviewed the patient's current medications. Allergies as of 02/24/2021   No Known Allergies         Medication List           Accurate as of February 24, 2021 10:32 AM. If you have any questions, ask your nurse or doctor.              acetaminophen 325 MG tablet Commonly known as: TYLENOL Take 2 tablets (650 mg total) by mouth every 6 (six) hours as needed for mild pain or headache (fever >/= 101).    amLODipine 10 MG tablet Commonly known as: NORVASC Take 10 mg by mouth daily.    ascorbic acid 500 MG tablet Commonly known as: VITAMIN C Take 1 tablet (500 mg total) by mouth daily.    aspirin EC 81 MG tablet Take 81 mg by mouth daily.    DULoxetine 30 MG capsule Commonly known as: CYMBALTA Take 60 mg by mouth daily.    fluticasone-salmeterol 250-50 MCG/ACT Aepb Commonly known as: ADVAIR Inhale 1 puff into the lungs every 12 (twelve) hours.    HYDROcodone-acetaminophen 5-325 MG tablet Commonly known as: NORCO/VICODIN Take 1 tablet by mouth every 6  (six) hours as needed for moderate pain.    hydrOXYzine 25 MG tablet Commonly known as: ATARAX/VISTARIL Take 25 mg by mouth 3 (three) times daily as needed for anxiety.    loratadine 10 MG tablet Commonly known as: CLARITIN Take 1 tablet (10 mg total) by mouth daily.    losartan 25 MG tablet Commonly known as: COZAAR Take 25 mg by mouth daily.    oxyCODONE 5 MG immediate release tablet Commonly known as: Oxy IR/ROXICODONE Take 5 mg by mouth every 8 (eight) hours as needed.    ProAir HFA 108 (90 Base) MCG/ACT inhaler Generic drug: albuterol Inhale 2 puffs into the lungs every 4 (four) hours as needed for wheezing or shortness of breath.    sodium bicarbonate 650 MG tablet Take 650 mg by mouth 3 (three) times daily.    zinc sulfate 220 (50 Zn) MG capsule Take 1 capsule (220 mg total) by mouth daily.               ROS:  A comprehensive review of systems was negative except for: Constitutional: positive for tired/ sluggish Respiratory: positive for cough, wheezing, and SOB Musculoskeletal: positive for back pain and joint pain   Blood pressure 127/84, pulse 100, temperature 99.5 F (37.5 C), temperature source Other (Comment), resp. rate 18, height 5\' 8"  (1.727 m), weight 299 lb (135.6 kg), SpO2 91 %. Physical Exam Vitals reviewed. Constitutional:      Appearance: Normal appearance. HENT:    Head: Normocephalic.    Nose: Nose normal.    Mouth/Throat:    Mouth: Mucous membranes are moist. Eyes:    Extraocular Movements: Extraocular movements intact. Cardiovascular:    Rate and Rhythm: Normal rate and regular rhythm. Pulmonary:    Effort: Pulmonary effort is normal.    Breath sounds: Normal breath sounds. Abdominal:    General: There is no distension.    Palpations: Abdomen is soft.    Tenderness: There is no abdominal tenderness. Musculoskeletal:        General: No swelling. Normal range of motion.    Cervical back: Normal range of motion. Skin:    General:  Skin is warm. Neurological:    General: No focal deficit present.    Mental Status: He is alert. Mental status is at baseline.  He is disoriented. Psychiatric:        Mood and Affect: Mood normal.        Behavior: Behavior normal.        Thought Content: Thought content normal.        Judgment: Judgment normal.     Results:   CLINICAL DATA:  Cirrhosis   EXAM: ABDOMEN ULTRASOUND COMPLETE   COMPARISON:  Ultrasound 03/26/2015 renal ultrasound 09/19/2019   FINDINGS: Gallbladder: No shadowing stone. Hypoechoic nodule at the gallbladder fundus measuring up to 1 cm. Normal wall thickness. No sonographic Murphy. Small amount of gallbladder sludge.   Common bile duct: Diameter: 3.6 mm   Liver: Liver is echogenic. No focal hepatic abnormality is seen. There may be subtle surface nodularity. Portal vein is patent on color Doppler imaging with normal direction of blood flow towards the liver.   IVC: No abnormality visualized.   Pancreas: Visualized portion unremarkable.   Spleen: Upper normal in size   Right Kidney: Length: 11.2 cm. Cyst in the upper pole measuring 1.9 cm   Left Kidney: Length: 12.1 cm. Cyst containing scattered echoes at the upper pole measuring 1.8 cm, previously 1.4 cm.   Abdominal aorta: No aneurysm visualized.   Other findings: None.   IMPRESSION: 1. Echogenic liver consistent with hepatic steatosis and or hepatocellular disease. Negative for hepatic mass lesion by sonography. 2. Suspected gallbladder polyp or small mass at the fundus measuring up to 1 cm. Surgical consultation is recommended.   These results will be called to the ordering clinician or representative by the Radiologist Assistant, and communication documented in the PACS or [employer].     Electronically Signed   By: Donavan Foil M.D.   On: 02/12/2021 19:43   Assessment & Plan:  COVEY BALLER is a 60 y.o. male with a gallbladder polyp that is 1cm in size. I reviewed  the imaging with Dr. Thornton Papas and discussed that this definitely a polyp and recommendations would be for surgery.    PLAN: I counseled the patient about the indication, risks and benefits of laparoscopic cholecystectomy.  He understands there is a very small chance for bleeding, infection, injury to normal structures (including common bile duct), conversion to open surgery, persistent symptoms, evolution of postcholecystectomy diarrhea, need for secondary interventions, anesthesia reaction, cardiopulmonary issues and other risks not specifically detailed here. I described the expected recovery, the plan for follow-up and the restrictions during the recovery phase.  All questions were answered.   Discussed liver biopsy given the fatty liver disease and additional risk of bleeding.   I discussed with patient and [name] his daughter the polyp and his respiratory disease.  We discussed that I will discuss things further with Dr. Halford Chessman, Pulmonary and anesthesia.  Discussed that if he had surgery at [facility] he would likely need to be admitted overnight for monitoring.   All questions were answered to the satisfaction of the patient and family.     [name] 02/24/2021, 10:32 AM   Discussed with Dr. Halford Chessman. Recommended early week surgery and plans for admission and pulmonary evaluation post procedure. Intermediate risk given his lung history.  Curlene Labrum, MD [facility] [address] [place], [place] [address] [phone] (office)

## 2021-03-18 NOTE — Patient Instructions (Signed)
Mathew Lara  03/18/2021     @PREFPERIOPPHARMACY @   Your procedure is scheduled on 03/23/2021.   Report to [facility] at  [phone]  A.M.   Call this number if you have problems the morning of surgery:  [phone]   Remember:  Do not eat or drink after midnight.      Take these medicines the morning of surgery with A SIP OF WATER      amlodipine, oxycodone (if needed).    Do not wear jewelry, make-up or nail polish.  Do not wear lotions, powders, or perfumes, or deodorant.  Do not shave 48 hours prior to surgery.  Men may shave face and neck.  Do not bring valuables to the hospital.  [facility] is not responsible for any belongings or valuables.  Contacts, dentures or bridgework may not be worn into surgery.  Leave your suitcase in the car.  After surgery it may be brought to your room.  For patients admitted to the hospital, discharge time will be determined by your treatment team.  Patients discharged the day of surgery will not be allowed to drive home and must have someone with them for 24 hours.    Special instructions:     DO NOT smoke tobacco or vape for 24 hours before your procedure.  Please read over the following fact sheets that you were given. Coughing and Deep Breathing, Surgical Site Infection Prevention, Anesthesia Post-op Instructions, and Care and Recovery After Surgery      Minimally Invasive Cholecystectomy, Care After This sheet gives you information about how to care for yourself after your procedure. Your health care provider may also give you more specific instructions. If you have problems or questions, contact your health careprovider. What can I expect after the procedure? After the procedure, it is common to have: Pain at your incision sites. You will be given medicines to control this pain. Mild nausea or vomiting. Bloating and possible shoulder pain from the gas that was used during the procedure. Follow these  instructions at home: Medicines Take over-the-counter and prescription medicines only as told by your health care provider. If you were prescribed an antibiotic medicine, take or use it as told by your health care provider. Do not stop using the antibiotic even if you start to feel better. Ask your health care provider if the medicine prescribed to you: Requires you to avoid driving or using machinery. Can cause constipation. You may need to take these actions to prevent or treat constipation: Drink enough fluid to keep your urine pale yellow. Take over-the-counter or prescription medicines. Eat foods that are high in fiber, such as beans, whole grains, and fresh fruits and vegetables. Limit foods that are high in fat and processed sugars, such as fried or sweet foods. Incision care  Follow instructions from your health care provider about how to take care of your incisions. Make sure you: Wash your hands with soap and water for at least 20 seconds before and after you change your bandage (dressing). If soap and water are not available, use hand sanitizer. Change your dressing as told by your health care provider. Leave stitches (sutures), skin glue, or adhesive strips in place. These skin closures may need to be in place for 2 weeks or longer. If adhesive strip edges start to loosen and curl up, you may trim the loose edges. Do not remove adhesive strips completely unless your health care provider  tells you to do that. Do not take baths, swim, or use a hot tub until your health care provider approves. Ask your health care provider if you may take showers. You may only be allowed to take sponge baths. Check your incision area every day for signs of infection. Check for: More redness, swelling, or pain. Fluid or blood. Warmth. Pus or a bad smell.  Activity Rest as told by your health care provider. Avoid sitting for a long time without moving. Get up to take short walks every 1-2 hours. This  is important to improve blood flow and breathing. Ask for help if you feel weak or unsteady. Do not lift anything that is heavier than 10 lb (4.5 kg), or the limit that you are told, until your health care provider says that it is safe. Do not play contact sports until your health care provider approves. Do not return to work or school until your health care provider approves. Return to your normal activities as told by your health care provider. Ask your health care provider what activities are safe for you. General instructions If you were given a sedative during the procedure, it can affect you for several hours. Do not drive or operate machinery until your health care provider says that it is safe. Keep all follow-up visits as told by your health care provider. This is important. Contact a health care provider if: You develop a rash. You have more redness, swelling, or pain around your incisions. You have fluid or blood coming from your incisions. Your incisions feel warm to the touch. You have pus or a bad smell coming from your incisions. You have a fever. One or more of your incisions breaks open. Get help right away if: You have trouble breathing. You have chest pain. You have increasing pain in your shoulders. You faint or feel dizzy when you stand. You have severe pain in your abdomen. You have nausea or vomiting that lasts for more than one day. You have leg pain. Summary After your procedure, it is common to have pain at the incision sites. You may also have nausea or bloating. Follow your health care provider's instructions about medicine, activity restrictions, and caring for your incision areas. Do not do activities that require a lot of effort. Contact a health care provider if you have a fever or other signs of infection, such as more redness, swelling, or pain around the incisions. Get help right away if you have chest pain, increasing pain in the shoulders, or trouble  breathing. This information is not intended to replace advice given to you by your health care provider. Make sure you discuss any questions you have with your healthcare provider. Document Revised: 05/23/2019 Document Reviewed: 05/23/2019 Elsevier Patient Education  [place]. Liver Biopsy, Care After After a liver biopsy, it is common to have these things in the area where the biopsy was done. You may: Have pain. Feel sore. Have bruising. You may also feel tired for a few days. Follow these instructions at home: Medicines Take over-the-counter and prescription medicines only as told by your doctor. If you were prescribed an antibiotic medicine, take it as told by your doctor. Do not stop taking the antibiotic, even if you start to feel better. Do not take medicines that may thin your blood. These medicines include aspirin and ibuprofen. Take them only if your doctor tells you to. If told, take steps to prevent problems with pooping (constipation). You may need to: Drink  enough fluid to keep your pee (urine) pale yellow. Take medicines. You will be told what medicines to take. Eat foods that are high in fiber. These include beans, whole grains, and fresh fruits and vegetables. Limit foods that are high in fat and sugar. These include fried or sweet foods. Ask your doctor if you should avoid driving or using machines while you are taking your medicine. Caring for your incision Follow instructions from your doctor about how to take care of your cut from surgery (incisions). Make sure you: Wash your hands with soap and water for at least 20 seconds before and after you change your bandage. If you cannot use soap and water, use hand sanitizer. Change your bandage. Leavestitches or skin glue in place for at least two weeks. Leave tape strips alone unless you are told to take them off. You may trim the edges of the tape strips if they curl up. Check your incision every day for signs of  infection. Check for: Redness, swelling, or more pain. Fluid or blood. Warmth. Pus or a bad smell. Do not take baths, swim, or use a hot tub. Ask your doctor about taking showers or sponge baths. Activity Rest at home for 1-2 days, or as told by your doctor. Get up to take short walks every 1 to 2 hours. Ask for help if you feel weak or unsteady. Do not lift anything that is heavier than 10 lb (4.5 kg), or the limit that you are told. Do not play contact sports for 2 weeks after the procedure. Return to your normal activities as told by your doctor. Ask what activities are safe for you. General instructions  Do not drink alcohol in the first week after the procedure. Plan to have a responsible adult care for you for the time you are told after you leave the hospital or clinic. This is important. It is up to you to get the results of your procedure. Ask how to get your results when they are ready. Keep all follow-up visits.  Contact a doctor if: You have more bleeding in your incision. Your incision swells, or is red and more painful. You have fluid that comes from your incision. You develop a rash. You have fever or chills. Get help right away if: You have swelling, bloating, or pain in your belly (abdomen). You get dizzy or faint. You vomit or you feel like vomiting. You have trouble breathing or feel short of breath. You have chest pain. You have problems talking or seeing. You have trouble with your balance or moving your arms or legs. These symptoms may be an emergency. Get help right away. Call your local emergency services (911 in the [place] Do not wait to see if the symptoms will go away. Do not drive yourself to the hospital. Summary After the procedure, it is common to have pain, soreness, bruising, and tiredness. Your doctor will tell you how to take care of yourself at home. Change your bandage, take your medicines, and limit your activities as told by your  doctor. Call your doctor if you have symptoms of infection. Get help right away if your belly swells, your cut bleeds a lot, or you have trouble talking or breathing. This information is not intended to replace advice given to you by your health care provider. Make sure you discuss any questions you have with your healthcare provider. Document Revised: 07/07/2020 Document Reviewed: 07/07/2020 Elsevier Patient Education  2022 [place] Anesthesia, Adult, Care After  This sheet gives you information about how to care for yourself after your procedure. Your health care provider may also give you more specific instructions. If you have problems or questions, contact your health careprovider. What can I expect after the procedure? After the procedure, the following side effects are common: Pain or discomfort at the IV site. Nausea. Vomiting. Sore throat. Trouble concentrating. Feeling cold or chills. Feeling weak or tired. Sleepiness and fatigue. Soreness and body aches. These side effects can affect parts of the body that were not involved in surgery. Follow these instructions at home: For the time period you were told by your health care provider:  Rest. Do not participate in activities where you could fall or become injured. Do not drive or use machinery. Do not drink alcohol. Do not take sleeping pills or medicines that cause drowsiness. Do not make important decisions or sign legal documents. Do not take care of children on your own.  Eating and drinking Follow any instructions from your health care provider about eating or drinking restrictions. When you feel hungry, start by eating small amounts of foods that are soft and easy to digest (bland), such as toast. Gradually return to your regular diet. Drink enough fluid to keep your urine pale yellow. If you vomit, rehydrate by drinking water, juice, or clear broth. General instructions If you have sleep apnea, surgery  and certain medicines can increase your risk for breathing problems. Follow instructions from your health care provider about wearing your sleep device: Anytime you are sleeping, including during daytime naps. While taking prescription pain medicines, sleeping medicines, or medicines that make you drowsy. Have a responsible adult stay with you for the time you are told. It is important to have someone help care for you until you are awake and alert. Return to your normal activities as told by your health care provider. Ask your health care provider what activities are safe for you. Take over-the-counter and prescription medicines only as told by your health care provider. If you smoke, do not smoke without supervision. Keep all follow-up visits as told by your health care provider. This is important. Contact a health care provider if: You have nausea or vomiting that does not get better with medicine. You cannot eat or drink without vomiting. You have pain that does not get better with medicine. You are unable to pass urine. You develop a skin rash. You have a fever. You have redness around your IV site that gets worse. Get help right away if: You have difficulty breathing. You have chest pain. You have blood in your urine or stool, or you vomit blood. Summary After the procedure, it is common to have a sore throat or nausea. It is also common to feel tired. Have a responsible adult stay with you for the time you are told. It is important to have someone help care for you until you are awake and alert. When you feel hungry, start by eating small amounts of foods that are soft and easy to digest (bland), such as toast. Gradually return to your regular diet. Drink enough fluid to keep your urine pale yellow. Return to your normal activities as told by your health care provider. Ask your health care provider what activities are safe for you. This information is not intended to replace advice  given to you by your health care provider. Make sure you discuss any questions you have with your healthcare provider. Document Revised: 05/08/2020 Document Reviewed: 12/06/2019 Elsevier Patient Education  [place]. How to Use Chlorhexidine for Bathing Chlorhexidine gluconate (CHG) is a germ-killing (antiseptic) solution that is used to clean the skin. It can get rid of the bacteria that normally live on the skin and can keep them away for about 24 hours. To clean your skin with CHG, you may be given: A CHG solution to use in the shower or as part of a sponge bath. A prepackaged cloth that contains CHG. Cleaning your skin with CHG may help lower the risk for infection: While you are staying in the intensive care unit of the hospital. If you have a vascular access, such as a central line, to provide short-term or long-term access to your veins. If you have a catheter to drain urine from your bladder. If you are on a ventilator. A ventilator is a machine that helps you breathe by moving air in and out of your lungs. After surgery. What are the risks? Risks of using CHG include: A skin reaction. Hearing loss, if CHG gets in your ears. Eye injury, if CHG gets in your eyes and is not rinsed out. The CHG product catching fire. Make sure that you avoid smoking and flames after applying CHG to your skin. Do not use CHG: If you have a chlorhexidine allergy or have previously reacted to chlorhexidine. On babies younger than 62 months of age. How to use CHG solution Use CHG only as told by your health care provider, and follow the instructions on the label. Use the full amount of CHG as directed. Usually, this is one bottle. During a shower Follow these steps when using CHG solution during a shower (unless your health care provider gives you different instructions): Start the shower. Use your normal soap and shampoo to wash your face and hair. Turn off the shower or move out of the shower  stream. Pour the CHG onto a clean washcloth. Do not use any type of brush or rough-edged sponge. Starting at your neck, lather your body down to your toes. Make sure you follow these instructions: If you will be having surgery, pay special attention to the part of your body where you will be having surgery. Scrub this area for at least 1 minute. Do not use CHG on your head or face. If the solution gets into your ears or eyes, rinse them well with water. Avoid your genital area. Avoid any areas of skin that have broken skin, cuts, or scrapes. Scrub your back and under your arms. Make sure to wash skin folds. Let the lather sit on your skin for 1-2 minutes or as long as told by your health care provider. Thoroughly rinse your entire body in the shower. Make sure that all body creases and crevices are rinsed well. Dry off with a clean towel. Do not put any substances on your body afterward--such as powder, lotion, or perfume--unless you are told to do so by your health care provider. Only use lotions that are recommended by the manufacturer. Put on clean clothes or pajamas. If it is the night before your surgery, sleep in clean sheets.  During a sponge bath Follow these steps when using CHG solution during a sponge bath (unless your health care provider gives you different instructions): Use your normal soap and shampoo to wash your face and hair. Pour the CHG onto a clean washcloth. Starting at your neck, lather your body down to your toes. Make sure you follow these instructions: If you will be having surgery, pay special attention  to the part of your body where you will be having surgery. Scrub this area for at least 1 minute. Do not use CHG on your head or face. If the solution gets into your ears or eyes, rinse them well with water. Avoid your genital area. Avoid any areas of skin that have broken skin, cuts, or scrapes. Scrub your back and under your arms. Make sure to wash skin folds. Let  the lather sit on your skin for 1-2 minutes or as long as told by your health care provider. Using a different clean, wet washcloth, thoroughly rinse your entire body. Make sure that all body creases and crevices are rinsed well. Dry off with a clean towel. Do not put any substances on your body afterward--such as powder, lotion, or perfume--unless you are told to do so by your health care provider. Only use lotions that are recommended by the manufacturer. Put on clean clothes or pajamas. If it is the night before your surgery, sleep in clean sheets. How to use CHG prepackaged cloths Only use CHG cloths as told by your health care provider, and follow the instructions on the label. Use the CHG cloth on clean, dry skin. Do not use the CHG cloth on your head or face unless your health care provider tells you to. When washing with the CHG cloth: Avoid your genital area. Avoid any areas of skin that have broken skin, cuts, or scrapes. Before surgery Follow these steps when using a CHG cloth to clean before surgery (unless your health care provider gives you different instructions): Using the CHG cloth, vigorously scrub the part of your body where you will be having surgery. Scrub using a back-and-forth motion for 3 minutes. The area on your body should be completely wet with CHG when you are done scrubbing. Do not rinse. Discard the cloth and let the area air-dry. Do not put any substances on the area afterward, such as powder, lotion, or perfume. Put on clean clothes or pajamas. If it is the night before your surgery, sleep in clean sheets.  For general bathing Follow these steps when using CHG cloths for general bathing (unless your health care provider gives you different instructions). Use a separate CHG cloth for each area of your body. Make sure you wash between any folds of skin and between your fingers and toes. Wash your body in the following order, switching to a new cloth after each  step: The front of your neck, shoulders, and chest. Both of your arms, under your arms, and your hands. Your stomach and groin area, avoiding the genitals. Your right leg and foot. Your left leg and foot. The back of your neck, your back, and your buttocks. Do not rinse. Discard the cloth and let the area air-dry. Do not put any substances on your body afterward--such as powder, lotion, or perfume--unless you are told to do so by your health care provider. Only use lotions that are recommended by the manufacturer. Put on clean clothes or pajamas. Contact a health care provider if: Your skin gets irritated after scrubbing. You have questions about using your solution or cloth. Get help right away if: Your eyes become very red or swollen. Your eyes itch badly. Your skin itches badly and is red or swollen. Your hearing changes. You have trouble seeing. You have swelling or tingling in your mouth or throat. You have trouble breathing. You swallow any chlorhexidine. Summary Chlorhexidine gluconate (CHG) is a germ-killing (antiseptic) solution that is used to  clean the skin. Cleaning your skin with CHG may help to lower your risk for infection. You may be given CHG to use for bathing. It may be in a bottle or in a prepackaged cloth to use on your skin. Carefully follow your health care provider's instructions and the instructions on the product label. Do not use CHG if you have a chlorhexidine allergy. Contact your health care provider if your skin gets irritated after scrubbing. This information is not intended to replace advice given to you by your health care provider. Make sure you discuss any questions you have with your healthcare provider. Document Revised: 01/04/2020 Document Reviewed: 02/08/2020 Elsevier Patient Education  [place].

## 2021-03-20 ENCOUNTER — Encounter (HOSPITAL_COMMUNITY)
Admission: RE | Admit: 2021-03-20 | Discharge: 2021-03-20 | Disposition: A | Discharge status: Home / Self Care | Payer: 59 | Source: Ambulatory Visit | Attending: General Surgery | Admitting: General Surgery

## 2021-03-20 ENCOUNTER — Encounter (HOSPITAL_COMMUNITY): Payer: Self-pay

## 2021-03-20 ENCOUNTER — Other Ambulatory Visit (HOSPITAL_COMMUNITY)
Admission: RE | Admit: 2021-03-20 | Discharge: 2021-03-20 | Disposition: A | Discharge status: Home / Self Care | Payer: 59 | Source: Ambulatory Visit | Attending: General Surgery | Admitting: General Surgery

## 2021-03-20 ENCOUNTER — Other Ambulatory Visit: Payer: Self-pay

## 2021-03-20 DIAGNOSIS — Z01818 Encounter for other preprocedural examination: Secondary | ICD-10-CM | POA: Insufficient documentation

## 2021-03-20 DIAGNOSIS — Z20822 Contact with and (suspected) exposure to covid-19: Secondary | ICD-10-CM | POA: Diagnosis not present

## 2021-03-20 LAB — SARS CORONAVIRUS 2 (TAT 6-24 HRS): SARS Coronavirus 2: NEGATIVE

## 2021-03-23 ENCOUNTER — Ambulatory Visit (HOSPITAL_COMMUNITY): Payer: 59 | Admitting: Anesthesiology

## 2021-03-23 ENCOUNTER — Inpatient Hospital Stay (HOSPITAL_COMMUNITY)
Admission: RE | Admit: 2021-03-23 | Discharge: 2021-03-28 | DRG: 417 | Disposition: A | Discharge status: Home / Self Care | Payer: 59 | Attending: General Surgery | Admitting: General Surgery

## 2021-03-23 ENCOUNTER — Encounter (HOSPITAL_COMMUNITY): Payer: Self-pay | Admitting: General Surgery

## 2021-03-23 ENCOUNTER — Other Ambulatory Visit: Payer: Self-pay

## 2021-03-23 ENCOUNTER — Encounter (HOSPITAL_COMMUNITY)
Admission: RE | Disposition: A | Discharge status: Home / Self Care | Payer: Self-pay | Source: Home / Self Care | Attending: General Surgery

## 2021-03-23 DIAGNOSIS — N179 Acute kidney failure, unspecified: Secondary | ICD-10-CM | POA: Diagnosis not present

## 2021-03-23 DIAGNOSIS — Z6841 Body Mass Index (BMI) 40.0 and over, adult: Secondary | ICD-10-CM

## 2021-03-23 DIAGNOSIS — Z87891 Personal history of nicotine dependence: Secondary | ICD-10-CM

## 2021-03-23 DIAGNOSIS — Z8616 Personal history of COVID-19: Secondary | ICD-10-CM

## 2021-03-23 DIAGNOSIS — K824 Cholesterolosis of gallbladder: Principal | ICD-10-CM

## 2021-03-23 DIAGNOSIS — F419 Anxiety disorder, unspecified: Secondary | ICD-10-CM | POA: Diagnosis present

## 2021-03-23 DIAGNOSIS — E782 Mixed hyperlipidemia: Secondary | ICD-10-CM | POA: Diagnosis present

## 2021-03-23 DIAGNOSIS — K760 Fatty (change of) liver, not elsewhere classified: Secondary | ICD-10-CM

## 2021-03-23 DIAGNOSIS — J95821 Acute postprocedural respiratory failure: Secondary | ICD-10-CM | POA: Diagnosis not present

## 2021-03-23 DIAGNOSIS — R0902 Hypoxemia: Secondary | ICD-10-CM

## 2021-03-23 DIAGNOSIS — G8929 Other chronic pain: Secondary | ICD-10-CM | POA: Diagnosis present

## 2021-03-23 DIAGNOSIS — Z96643 Presence of artificial hip joint, bilateral: Secondary | ICD-10-CM | POA: Diagnosis present

## 2021-03-23 DIAGNOSIS — J849 Interstitial pulmonary disease, unspecified: Secondary | ICD-10-CM | POA: Diagnosis present

## 2021-03-23 DIAGNOSIS — I129 Hypertensive chronic kidney disease with stage 1 through stage 4 chronic kidney disease, or unspecified chronic kidney disease: Secondary | ICD-10-CM | POA: Diagnosis present

## 2021-03-23 DIAGNOSIS — B182 Chronic viral hepatitis C: Secondary | ICD-10-CM | POA: Diagnosis present

## 2021-03-23 DIAGNOSIS — J9601 Acute respiratory failure with hypoxia: Secondary | ICD-10-CM | POA: Diagnosis present

## 2021-03-23 DIAGNOSIS — Z7982 Long term (current) use of aspirin: Secondary | ICD-10-CM

## 2021-03-23 DIAGNOSIS — N1832 Chronic kidney disease, stage 3b: Secondary | ICD-10-CM | POA: Diagnosis present

## 2021-03-23 DIAGNOSIS — K219 Gastro-esophageal reflux disease without esophagitis: Secondary | ICD-10-CM | POA: Diagnosis present

## 2021-03-23 DIAGNOSIS — J9621 Acute and chronic respiratory failure with hypoxia: Secondary | ICD-10-CM | POA: Diagnosis present

## 2021-03-23 DIAGNOSIS — K811 Chronic cholecystitis: Secondary | ICD-10-CM | POA: Diagnosis present

## 2021-03-23 DIAGNOSIS — Z9989 Dependence on other enabling machines and devices: Secondary | ICD-10-CM

## 2021-03-23 DIAGNOSIS — G4733 Obstructive sleep apnea (adult) (pediatric): Secondary | ICD-10-CM | POA: Diagnosis present

## 2021-03-23 DIAGNOSIS — J449 Chronic obstructive pulmonary disease, unspecified: Secondary | ICD-10-CM | POA: Diagnosis present

## 2021-03-23 DIAGNOSIS — Z79899 Other long term (current) drug therapy: Secondary | ICD-10-CM

## 2021-03-23 HISTORY — PX: LIVER BIOPSY: SHX301

## 2021-03-23 HISTORY — PX: CHOLECYSTECTOMY: SHX55

## 2021-03-23 SURGERY — LAPAROSCOPIC CHOLECYSTECTOMY
Anesthesia: General | Site: Abdomen

## 2021-03-23 MED ORDER — FENTANYL CITRATE (PF) 100 MCG/2ML IJ SOLN
INTRAMUSCULAR | Status: DC | PRN
  Administered 2021-03-23: 50 ug via INTRAVENOUS
  Administered 2021-03-23: 100 ug via INTRAVENOUS
  Administered 2021-03-23: 50 ug via INTRAVENOUS

## 2021-03-23 MED ORDER — CHLORHEXIDINE GLUCONATE CLOTH 2 % EX PADS: 6.0000 | MEDICATED_PAD | Freq: Once | CUTANEOUS | Status: DC

## 2021-03-23 MED ORDER — SUGAMMADEX SODIUM 500 MG/5ML IV SOLN
INTRAVENOUS | Status: DC | PRN
  Administered 2021-03-23: 250 mg via INTRAVENOUS

## 2021-03-23 MED ORDER — IBUPROFEN 600 MG PO TABS
600.0000 mg | ORAL_TABLET | Freq: Four times a day (QID) | ORAL | Status: DC | PRN
  Filled 2021-03-23: qty 1

## 2021-03-23 MED ORDER — SODIUM CHLORIDE 0.9 % IV SOLN
INTRAVENOUS | Status: AC
  Filled 2021-03-23: qty 2

## 2021-03-23 MED ORDER — PROPOFOL 10 MG/ML IV BOLUS
INTRAVENOUS | Status: DC | PRN
  Administered 2021-03-23: 200 mg via INTRAVENOUS

## 2021-03-23 MED ORDER — SUGAMMADEX SODIUM 500 MG/5ML IV SOLN
INTRAVENOUS | Status: AC
  Filled 2021-03-23: qty 5

## 2021-03-23 MED ORDER — SODIUM CHLORIDE 0.9 % IV SOLN
2.0000 g | INTRAVENOUS | Status: AC
  Administered 2021-03-23: 2 g via INTRAVENOUS

## 2021-03-23 MED ORDER — MOMETASONE FURO-FORMOTEROL FUM 200-5 MCG/ACT IN AERO
2.0000 | INHALATION_SPRAY | Freq: Two times a day (BID) | RESPIRATORY_TRACT | Status: DC
  Administered 2021-03-23 – 2021-03-28 (×10): 2 via RESPIRATORY_TRACT
  Filled 2021-03-23: qty 8.8

## 2021-03-23 MED ORDER — HEMOSTATIC AGENTS (NO CHARGE) OPTIME
TOPICAL | Status: DC | PRN
  Administered 2021-03-23 (×2): 1 via TOPICAL

## 2021-03-23 MED ORDER — MORPHINE SULFATE (PF) 2 MG/ML IV SOLN
2.0000 mg | INTRAVENOUS | Status: DC | PRN
  Administered 2021-03-23 – 2021-03-24 (×4): 2 mg via INTRAVENOUS
  Filled 2021-03-23 (×4): qty 1

## 2021-03-23 MED ORDER — SODIUM CHLORIDE 0.9 % IR SOLN
Status: DC | PRN
  Administered 2021-03-23: 1000 mL

## 2021-03-23 MED ORDER — METOPROLOL TARTRATE 5 MG/5ML IV SOLN
INTRAVENOUS | Status: DC | PRN
  Administered 2021-03-23 (×3): 1 mg via INTRAVENOUS

## 2021-03-23 MED ORDER — LOSARTAN POTASSIUM 50 MG PO TABS
50.0000 mg | ORAL_TABLET | Freq: Every day | ORAL | Status: DC
  Administered 2021-03-24 – 2021-03-28 (×5): 50 mg via ORAL
  Filled 2021-03-23 (×5): qty 1

## 2021-03-23 MED ORDER — ACETAMINOPHEN 500 MG PO TABS
1000.0000 mg | ORAL_TABLET | Freq: Four times a day (QID) | ORAL | Status: DC
  Administered 2021-03-23 – 2021-03-28 (×18): 1000 mg via ORAL
  Filled 2021-03-23 (×19): qty 2

## 2021-03-23 MED ORDER — DOCUSATE SODIUM 100 MG PO CAPS
100.0000 mg | ORAL_CAPSULE | Freq: Two times a day (BID) | ORAL | Status: DC
  Administered 2021-03-23 – 2021-03-28 (×11): 100 mg via ORAL
  Filled 2021-03-23 (×11): qty 1

## 2021-03-23 MED ORDER — SODIUM BICARBONATE 650 MG PO TABS
650.0000 mg | ORAL_TABLET | Freq: Three times a day (TID) | ORAL | Status: DC
  Administered 2021-03-23 – 2021-03-28 (×15): 650 mg via ORAL
  Filled 2021-03-23 (×16): qty 1

## 2021-03-23 MED ORDER — MEPERIDINE HCL 50 MG/ML IJ SOLN: 6.2500 mg | INTRAMUSCULAR | Status: DC | PRN

## 2021-03-23 MED ORDER — DEXAMETHASONE SODIUM PHOSPHATE 10 MG/ML IJ SOLN
INTRAMUSCULAR | Status: AC
  Filled 2021-03-23: qty 1

## 2021-03-23 MED ORDER — METOPROLOL TARTRATE 5 MG/5ML IV SOLN
INTRAVENOUS | Status: AC
  Filled 2021-03-23: qty 5

## 2021-03-23 MED ORDER — PROPOFOL 10 MG/ML IV BOLUS
INTRAVENOUS | Status: AC
  Filled 2021-03-23: qty 20

## 2021-03-23 MED ORDER — LACTATED RINGERS IV SOLN: INTRAVENOUS | Status: DC

## 2021-03-23 MED ORDER — CHLORHEXIDINE GLUCONATE 0.12 % MT SOLN
15.0000 mL | Freq: Once | OROMUCOSAL | Status: AC
  Administered 2021-03-23: 15 mL via OROMUCOSAL

## 2021-03-23 MED ORDER — ONDANSETRON HCL 4 MG/2ML IJ SOLN
INTRAMUSCULAR | Status: AC
  Filled 2021-03-23: qty 2

## 2021-03-23 MED ORDER — LIDOCAINE HCL (PF) 2 % IJ SOLN
INTRAMUSCULAR | Status: AC
  Filled 2021-03-23: qty 5

## 2021-03-23 MED ORDER — ONDANSETRON HCL 4 MG/2ML IJ SOLN
INTRAMUSCULAR | Status: DC | PRN
  Administered 2021-03-23: 4 mg via INTRAVENOUS

## 2021-03-23 MED ORDER — HYDROMORPHONE HCL 1 MG/ML IJ SOLN
0.2500 mg | INTRAMUSCULAR | Status: DC | PRN
  Administered 2021-03-23 (×4): 0.5 mg via INTRAVENOUS
  Filled 2021-03-23 (×4): qty 0.5

## 2021-03-23 MED ORDER — BUPIVACAINE HCL (PF) 0.5 % IJ SOLN
INTRAMUSCULAR | Status: DC | PRN
  Administered 2021-03-23: 20 mL

## 2021-03-23 MED ORDER — ALBUTEROL SULFATE HFA 108 (90 BASE) MCG/ACT IN AERS
2.0000 | INHALATION_SPRAY | RESPIRATORY_TRACT | Status: DC | PRN
Start: 2021-03-23 — End: 2021-03-28

## 2021-03-23 MED ORDER — ORAL CARE MOUTH RINSE: 15.0000 mL | Freq: Once | OROMUCOSAL | Status: AC

## 2021-03-23 MED ORDER — AMLODIPINE BESYLATE 5 MG PO TABS
10.0000 mg | ORAL_TABLET | Freq: Every day | ORAL | Status: DC
  Administered 2021-03-24 – 2021-03-28 (×5): 10 mg via ORAL
  Filled 2021-03-23 (×5): qty 2

## 2021-03-23 MED ORDER — ONDANSETRON HCL 4 MG/2ML IJ SOLN: 4.0000 mg | Freq: Once | INTRAMUSCULAR | Status: DC | PRN

## 2021-03-23 MED ORDER — MIDAZOLAM HCL 2 MG/2ML IJ SOLN
INTRAMUSCULAR | Status: AC
  Filled 2021-03-23: qty 2

## 2021-03-23 MED ORDER — ONDANSETRON HCL 4 MG/2ML IJ SOLN: 4.0000 mg | Freq: Four times a day (QID) | INTRAMUSCULAR | Status: DC | PRN

## 2021-03-23 MED ORDER — LIDOCAINE HCL (CARDIAC) PF 100 MG/5ML IV SOSY
PREFILLED_SYRINGE | INTRAVENOUS | Status: DC | PRN
  Administered 2021-03-23: 80 mg via INTRATRACHEAL

## 2021-03-23 MED ORDER — ONDANSETRON 4 MG PO TBDP: 4.0000 mg | ORAL_TABLET | Freq: Four times a day (QID) | ORAL | Status: DC | PRN

## 2021-03-23 MED ORDER — DIPHENHYDRAMINE HCL 50 MG/ML IJ SOLN: 12.5000 mg | Freq: Four times a day (QID) | INTRAMUSCULAR | Status: DC | PRN

## 2021-03-23 MED ORDER — DEXAMETHASONE SODIUM PHOSPHATE 10 MG/ML IJ SOLN
INTRAMUSCULAR | Status: DC | PRN
  Administered 2021-03-23: 5 mg via INTRAVENOUS

## 2021-03-23 MED ORDER — SEVOFLURANE IN SOLN
RESPIRATORY_TRACT | Status: AC
  Filled 2021-03-23: qty 250

## 2021-03-23 MED ORDER — FENTANYL CITRATE (PF) 250 MCG/5ML IJ SOLN
INTRAMUSCULAR | Status: AC
  Filled 2021-03-23: qty 5

## 2021-03-23 MED ORDER — DIPHENHYDRAMINE HCL 12.5 MG/5ML PO ELIX: 12.5000 mg | ORAL_SOLUTION | Freq: Four times a day (QID) | ORAL | Status: DC | PRN

## 2021-03-23 MED ORDER — ROCURONIUM BROMIDE 10 MG/ML (PF) SYRINGE
PREFILLED_SYRINGE | INTRAVENOUS | Status: AC
  Filled 2021-03-23: qty 10

## 2021-03-23 MED ORDER — METOPROLOL TARTRATE 5 MG/5ML IV SOLN: 5.0000 mg | Freq: Four times a day (QID) | INTRAVENOUS | Status: DC | PRN

## 2021-03-23 MED ORDER — OXYCODONE HCL 5 MG PO TABS
5.0000 mg | ORAL_TABLET | ORAL | Status: DC | PRN
  Administered 2021-03-23 – 2021-03-24 (×6): 10 mg via ORAL
  Administered 2021-03-25: 5 mg via ORAL
  Administered 2021-03-25 (×3): 10 mg via ORAL
  Administered 2021-03-25: 5 mg via ORAL
  Administered 2021-03-26: 10 mg via ORAL
  Administered 2021-03-26: 5 mg via ORAL
  Administered 2021-03-26 – 2021-03-28 (×4): 10 mg via ORAL
  Filled 2021-03-23: qty 1
  Filled 2021-03-23 (×6): qty 2
  Filled 2021-03-23: qty 1
  Filled 2021-03-23: qty 2
  Filled 2021-03-23: qty 1
  Filled 2021-03-23 (×7): qty 2

## 2021-03-23 MED ORDER — PANTOPRAZOLE SODIUM 40 MG IV SOLR
40.0000 mg | Freq: Every day | INTRAVENOUS | Status: DC
  Administered 2021-03-23 – 2021-03-27 (×5): 40 mg via INTRAVENOUS
  Filled 2021-03-23 (×5): qty 40

## 2021-03-23 MED ORDER — SIMETHICONE 80 MG PO CHEW
40.0000 mg | CHEWABLE_TABLET | Freq: Four times a day (QID) | ORAL | Status: DC | PRN
  Administered 2021-03-23 (×2): 40 mg via ORAL
  Filled 2021-03-23 (×2): qty 1

## 2021-03-23 MED ORDER — ROCURONIUM BROMIDE 10 MG/ML (PF) SYRINGE
PREFILLED_SYRINGE | INTRAVENOUS | Status: DC | PRN
  Administered 2021-03-23: 50 mg via INTRAVENOUS

## 2021-03-23 MED ORDER — HYDROXYZINE HCL 25 MG PO TABS
50.0000 mg | ORAL_TABLET | Freq: Every day | ORAL | Status: DC
  Administered 2021-03-23 – 2021-03-27 (×5): 50 mg via ORAL
  Filled 2021-03-23 (×5): qty 2

## 2021-03-23 MED ORDER — LORATADINE 10 MG PO TABS
10.0000 mg | ORAL_TABLET | Freq: Every day | ORAL | Status: DC
  Administered 2021-03-23 – 2021-03-28 (×6): 10 mg via ORAL
  Filled 2021-03-23 (×5): qty 1

## 2021-03-23 MED ORDER — SUCCINYLCHOLINE CHLORIDE 200 MG/10ML IV SOSY
PREFILLED_SYRINGE | INTRAVENOUS | Status: AC
  Filled 2021-03-23: qty 10

## 2021-03-23 MED ORDER — SUCCINYLCHOLINE CHLORIDE 200 MG/10ML IV SOSY
PREFILLED_SYRINGE | INTRAVENOUS | Status: DC | PRN
  Administered 2021-03-23: 180 mg via INTRAVENOUS

## 2021-03-23 MED ORDER — BUPIVACAINE HCL (PF) 0.5 % IJ SOLN
INTRAMUSCULAR | Status: AC
  Filled 2021-03-23: qty 30

## 2021-03-23 SURGICAL SUPPLY — 44 items
APPLICATOR ARISTA FLEXITIP XL (MISCELLANEOUS) ×2 IMPLANT
APPLIER CLIP ROT 10 11.4 M/L (STAPLE) ×2
BAG RETRIEVAL 10 (BASKET) ×1
BLADE SURG 15 STRL LF DISP TIS (BLADE) ×1 IMPLANT
BLADE SURG 15 STRL SS (BLADE) ×1
CHLORAPREP W/TINT 26 (MISCELLANEOUS) ×2 IMPLANT
CLIP APPLIE ROT 10 11.4 M/L (STAPLE) ×1 IMPLANT
CLOTH BEACON ORANGE TIMEOUT ST (SAFETY) ×2 IMPLANT
COVER LIGHT HANDLE STERIS (MISCELLANEOUS) ×4 IMPLANT
DECANTER SPIKE VIAL GLASS SM (MISCELLANEOUS) ×2 IMPLANT
DERMABOND ADVANCED (GAUZE/BANDAGES/DRESSINGS) ×1
DERMABOND ADVANCED .7 DNX12 (GAUZE/BANDAGES/DRESSINGS) ×1 IMPLANT
DRSG TELFA 3X8 NADH (GAUZE/BANDAGES/DRESSINGS) ×2 IMPLANT
ELECT REM PT RETURN 9FT ADLT (ELECTROSURGICAL) ×2
ELECTRODE REM PT RTRN 9FT ADLT (ELECTROSURGICAL) ×1 IMPLANT
GAUZE 4X4 16PLY ~~LOC~~+RFID DBL (SPONGE) ×2 IMPLANT
GLOVE SURG ENC MOIS LTX SZ6.5 (GLOVE) ×2 IMPLANT
GLOVE SURG LTX SZ6.5 (GLOVE) ×2 IMPLANT
GLOVE SURG UNDER POLY LF SZ6.5 (GLOVE) ×2 IMPLANT
GLOVE SURG UNDER POLY LF SZ7 (GLOVE) ×8 IMPLANT
GOWN STRL REUS W/TWL LRG LVL3 (GOWN DISPOSABLE) ×6 IMPLANT
GOWN STRL REUS W/TWL XL LVL3 (GOWN DISPOSABLE) ×2 IMPLANT
HEMOSTAT ARISTA ABSORB 3G PWDR (HEMOSTASIS) ×2 IMPLANT
HEMOSTAT SNOW SURGICEL 2X4 (HEMOSTASIS) ×2 IMPLANT
INST SET LAPROSCOPIC AP (KITS) ×2 IMPLANT
KIT TURNOVER KIT A (KITS) ×2 IMPLANT
MANIFOLD NEPTUNE II (INSTRUMENTS) ×2 IMPLANT
NEEDLE BIOPSY 14X6 SOFT TISS (NEEDLE) ×2 IMPLANT
NEEDLE INSUFFLATION 14GA 120MM (NEEDLE) ×2 IMPLANT
NS IRRIG 1000ML POUR BTL (IV SOLUTION) ×2 IMPLANT
PACK LAP CHOLE LZT030E (CUSTOM PROCEDURE TRAY) ×2 IMPLANT
PAD ARMBOARD 7.5X6 YLW CONV (MISCELLANEOUS) ×2 IMPLANT
SET BASIN LINEN APH (SET/KITS/TRAYS/PACK) ×2 IMPLANT
SET TUBE SMOKE EVAC HIGH FLOW (TUBING) ×2 IMPLANT
SLEEVE ENDOPATH XCEL 5M (ENDOMECHANICALS) ×2 IMPLANT
SUT MNCRL AB 4-0 PS2 18 (SUTURE) ×4 IMPLANT
SUT VICRYL 0 UR6 27IN ABS (SUTURE) ×2 IMPLANT
SYS BAG RETRIEVAL 10MM (BASKET) ×1
SYSTEM BAG RETRIEVAL 10MM (BASKET) ×1 IMPLANT
TROCAR ENDO BLADELESS 11MM (ENDOMECHANICALS) ×2 IMPLANT
TROCAR XCEL NON-BLD 5MMX100MML (ENDOMECHANICALS) ×2 IMPLANT
TROCAR XCEL UNIV SLVE 11M 100M (ENDOMECHANICALS) ×2 IMPLANT
TUBE CONNECTING 12X1/4 (SUCTIONS) ×2 IMPLANT
WARMER LAPAROSCOPE (MISCELLANEOUS) ×2 IMPLANT

## 2021-03-23 NOTE — Progress Notes (Signed)
Patient has refused CPAP for tonight.  Wants to remain on O2 for the night.

## 2021-03-23 NOTE — Anesthesia Postprocedure Evaluation (Signed)
Anesthesia Post Note  Patient: Mathew Lara  Procedure(s) Performed: LAPAROSCOPIC CHOLECYSTECTOMY (Abdomen) LIVER BIOPSY (Abdomen)  Patient location during evaluation: PACU Anesthesia Type: General Level of consciousness: awake and alert and oriented Pain management: pain level controlled Vital Signs Assessment: post-procedure vital signs reviewed and stable Respiratory status: spontaneous breathing and respiratory function stable Cardiovascular status: blood pressure returned to baseline and stable Postop Assessment: no apparent nausea or vomiting Anesthetic complications: no   No notable events documented.   Last Vitals:  Vitals:   03/23/21 1140 03/23/21 1341  BP: (!) 173/89 (!) 179/89  Pulse: 97 97  Resp: 18 18  Temp: 36.7 C 36.7 C  SpO2:      Last Pain:  Vitals:   03/23/21 1341  TempSrc: Oral  PainSc:                  Jujuan Dugo C Tomasina Keasling

## 2021-03-23 NOTE — Anesthesia Preprocedure Evaluation (Addendum)
Anesthesia Evaluation  Patient identified by MRN, date of birth, ID band Patient awake    Reviewed: Allergy & Precautions, NPO status , Patient's Chart, lab work & pertinent test results  History of Anesthesia Complications Negative for: history of anesthetic complications  Airway Mallampati: III  TM Distance: >3 FB Neck ROM: Full    Dental  (+) Upper Dentures, Missing   Pulmonary shortness of breath (oxygen as needed), with exertion and Long-Term Oxygen Therapy, sleep apnea and Continuous Positive Airway Pressure Ventilation , COPD ( Idiopathic Pulmonary Fibrosis),  COPD inhaler, former smoker,    Pulmonary exam normal breath sounds clear to auscultation       Cardiovascular Exercise Tolerance: Poor hypertension, Pt. on medications + DOE   Rhythm:Regular Rate:Tachycardia - Systolic murmurs, - Diastolic murmurs, - Friction Rub, - Carotid Bruit, - Peripheral Edema and - Systolic Click 1. Left ventricular ejection fraction, by estimation, is 60 to 65%. The left ventricle has normal function. The left ventricle has no regional wall motion abnormalities. There is severe concentric left ventricular hypertrophy. Left ventricular diastolic parameters are consistent with Grade I diastolic dysfunction (impaired relaxation).  2. Right ventricular systolic function is low normal. The right ventricular size is normal.  3. The mitral valve is grossly normal. No evidence of mitral valve regurgitation.  4. The aortic valve is tricuspid. Aortic valve regurgitation is not visualized. No aortic stenosis is present.  5. Ascending aorta measures 3.9 cm. Aortic dilatation noted. There is mild dilatation of the ascending aorta.  6. The inferior vena cava is normal in size with greater than 50% respiratory variability, suggesting right atrial pressure of 3 mmHg.    Neuro/Psych PSYCHIATRIC DISORDERS Anxiety    GI/Hepatic GERD  Controlled,(+)      substance abuse  alcohol use, Hepatitis -, C  Endo/Other  Morbid obesity  Renal/GU Renal InsufficiencyRenal disease (creatinine - 2.02)     Musculoskeletal negative musculoskeletal ROS (+)   Abdominal   Peds  Hematology negative hematology ROS (+)   Anesthesia Other Findings 20-Mar-2021 09:36:21 [place] [facility] ROUTINE RECORD 04/24/1961 (89 yr) Male [place]: Loc:905 Technician: tbs Test ind: Vent. rate 104 BPM PR interval 182 ms QRS duration 72 ms QT/QTcB 330/433 ms P-R-T axes 33 37 36 Sinus tachycardia Nonspecific T wave abnormality Abnormal ECG No significant change since last tracing Confirmed by Candee Furbish [phone]) on 03/21/2021 8:45:04 PM  Reproductive/Obstetrics negative OB ROS                           Anesthesia Physical Anesthesia Plan  ASA: 4  Anesthesia Plan: General   Post-op Pain Management:    Induction: Intravenous  PONV Risk Score and Plan: 4 or greater and Ondansetron and Dexamethasone  Airway Management Planned: Oral ETT  Additional Equipment:   Intra-op Plan:   Post-operative Plan: Extubation in OR  Informed Consent: I have reviewed the patients History and Physical, chart, labs and discussed the procedure including the risks, benefits and alternatives for the proposed anesthesia with the patient or authorized representative who has indicated his/her understanding and acceptance.     Dental advisory given  Plan Discussed with: CRNA and Surgeon  Anesthesia Plan Comments:        Anesthesia Quick Evaluation

## 2021-03-23 NOTE — Transfer of Care (Signed)
Immediate Anesthesia Transfer of Care Note  Patient: Mathew Lara  Procedure(s) Performed: LAPAROSCOPIC CHOLECYSTECTOMY (Abdomen) LIVER BIOPSY (Abdomen)  Patient Location: PACU  Anesthesia Type:General  Level of Consciousness: awake, alert  and oriented  Airway & Oxygen Therapy: Patient Spontanous Breathing and Patient connected to face mask oxygen  Post-op Assessment: Report given to RN and Post -op Vital signs reviewed and stable  Post vital signs: Reviewed and stable  Last Vitals:  Vitals Value Taken Time  BP 148/86 03/23/21 0933  Temp    Pulse 97 03/23/21 0936  Resp 18 03/23/21 0936  SpO2 100 % 03/23/21 0936  Vitals shown include unvalidated device data.  Last Pain:  Vitals:   03/23/21 0639  TempSrc: Oral  PainSc: 0-No pain         Complications: No notable events documented.

## 2021-03-23 NOTE — Progress Notes (Signed)
[facility]  Updated daughter about surgery. Patient did well. Diet as tolerated, PRN for pain. Pulmonary consult in place and notified Dr. Melvyn Novas as patient is a patient of Dr. Halford Chessman and he wanted him evaluated post op given his COPD and risk.    Home bipap ordered, respiratory consult placed.  Curlene Labrum, MD [facility] [address] [place], [place] [address] [phone] (office)

## 2021-03-23 NOTE — Interval H&P Note (Signed)
History and Physical Interval Note:  03/23/2021 7:23 AM  Mathew Lara  has presented today for surgery, with the diagnosis of Gallbladder polyp, fatty liver.  The various methods of treatment have been discussed with the patient and family. After consideration of risks, benefits and other options for treatment, the patient has consented to  Procedure(s): LAPAROSCOPIC CHOLECYSTECTOMY (N/A) LIVER BIOPSY (N/A) as a surgical intervention.  The patient's history has been reviewed, patient examined, no change in status, stable for surgery.  I have reviewed the patient's chart and labs.  Questions were answered to the patient's satisfaction.   Admit overnight after pulmonary discussion. Will ask pulmonary to see. No changes.    [name]

## 2021-03-23 NOTE — Anesthesia Procedure Notes (Signed)
Procedure Name: Intubation Date/Time: 03/23/2021 7:32 AM Performed by: Karna Dupes, CRNA Pre-anesthesia Checklist: Patient identified, Emergency Drugs available, Suction available and Patient being monitored Patient Re-evaluated:Patient Re-evaluated prior to induction Oxygen Delivery Method: Circle system utilized Preoxygenation: Pre-oxygenation with 100% oxygen Induction Type: IV induction Ventilation: Mask ventilation without difficulty Laryngoscope Size: Mac and 4 Grade View: Grade I Tube type: Oral Tube size: 7.5 mm Number of attempts: 1 Airway Equipment and Method: Stylet Placement Confirmation: ETT inserted through vocal cords under direct vision, positive ETCO2 and breath sounds checked- equal and bilateral Secured at: 22 cm Tube secured with: Tape Dental Injury: Teeth and Oropharynx as per pre-operative assessment

## 2021-03-23 NOTE — Op Note (Signed)
Operative Note   Preoperative Diagnosis: Gallbladder polyp, fatty liver    Postoperative Diagnosis: Same   Procedure(s) Performed: Laparoscopic cholecystectomy, wedge liver biopsy   Surgeon: Lanell Matar. Constance Haw, MD   Assistants: No qualified resident was available   Anesthesia: General endotracheal   Anesthesiologist: Denese Killings, MD    Specimens: Gallbladder and wedge liver biopsy   Estimated Blood Loss: Minimal    Blood Replacement: None    Complications: None    Operative Findings: Distended gallbladder    Procedure: The patient was taken to the operating room and placed supine. General endotracheal anesthesia was induced. Intravenous antibiotics were  administered per protocol. An orogastric tube positioned to decompress the stomach. The abdomen was prepared and draped in the usual sterile fashion.    A supraumbilical incision was made and a Veress technique was utilized to achieve pneumoperitoneum to 15 mmHg with carbon dioxide. A 11 mm optiview port was placed through the supraumbilical region, and a 10 mm 0-degree operative laparoscope was introduced. The area underlying the trocar and Veress needle were inspected and without evidence of injury.  Remaining trocars were placed under direct vision. Two 5 mm ports were placed in the right abdomen, between the anterior axillary and midclavicular line.  A final 11 mm port was placed through the mid-epigastrium, near the falciform ligament.    The gallbladder fundus was elevated cephalad and the infundibulum was retracted to the patient's right. The gallbladder/cystic duct junction was skeletonized. The cystic artery noted in the triangle of Calot and was also skeletonized.  We then continued liberal medial and lateral dissection until the critical view of safety was achieved.    The cystic duct and cystic artery were triply clipped and divided. The gallbladder was then dissected from the liver bed with electrocautery.  A  Trucut was used through the abdominal wall to get a liver biopsy, but due to his girth it did not get an adequate sample. I opted then to get a wedge of liver for the biopsy using cautery.  The specimens were placed in an Endopouch and was retrieved through the epigastric site.   Final inspection revealed acceptable hemostasis. Arista an surgical SNOW were placed in the gallbladder bed.  Trocars were removed and pneumoperitoneum was released.  0 Vicryl fascial sutures were used to close the epigastric and umbilical port sites. Skin incisions were closed with 4-0 Monocryl subcuticular sutures and Dermabond. The patient was awakened from anesthesia and extubated without complication.    Curlene Labrum, MD [facility] [address] [place], [place] [address] [phone] (office)

## 2021-03-24 ENCOUNTER — Encounter (HOSPITAL_COMMUNITY): Payer: Self-pay | Admitting: General Surgery

## 2021-03-24 LAB — HIV ANTIBODY (ROUTINE TESTING W REFLEX): HIV Screen 4th Generation wRfx: NONREACTIVE

## 2021-03-24 MED ORDER — METOPROLOL TARTRATE 5 MG/5ML IV SOLN: 5.0000 mg | Freq: Once | INTRAVENOUS | Status: DC

## 2021-03-24 NOTE — Progress Notes (Signed)
Patient daughter called concerned about patient, states that she just spoke to him and he is in extreme pain and confused. When assessing patient, he is alert and oriented x4 and states that his pain is a 7/10 but is manageable. Patient states that he has been up moving a lot today and believes that is why he is in pain. Notified the MD about daughters concerns and medicated the patient with PRN medications.

## 2021-03-24 NOTE — Progress Notes (Addendum)
Came in to give patient his [facility] treatment.  Checked his sat and patient was 83% on RA.  Placed patient back on 2L [place], patient back up to 90%.  Patient's HR was a little elevated.  Patient also wants to use O2 tonight instead of CPAP.

## 2021-03-24 NOTE — Progress Notes (Addendum)
I was present with the medical student for this service. I personally verified the history of present illness, performed the physical exam, and made the plan for this encounter. I have verified the medical student's documentation and made modifications where appropriately. I have personally documented in my own words a brief history, physical, and plan below.     Doing well but having pain after getting up and ambulating. Says pain is 7/10. Was on O2 last night post op but off now and feeling fine. No SOB or wheezing. Dr. Melvyn Novas said he would check on patient socially unless some issue with pulmonary status post op.   Updated Camille. Given the pain will keep again overnight as he is at risk of desaturations if he is taking in shallow breaths etc.  Incentive spirometry, OOB and ambulate. RN aware.  Curlene Labrum, MD [facility] [address] [place], [place] [address] [phone] (office)    [facility] Progress Note  1 Day Post-Op  Subjective: Mr. Ricardo Schubach is a 60 year old male with a past medical history of hypertension, and chronic kidney disease who is postop day 1 following a laparoscopic cholecystectomy with a liver biopsy.  He did well overnight. He did not rest well because of some soreness in his abdomen, but he slept better after turning on his left side. He refused his CPAP last night, but he was given breathing treatments, and he says he had no difficulties breathing with the nasal cannula. He was taken for two walks by a nurse without the nasal cannula last night. When he returned to the room from one of the walks, his oxygen saturation was in the low 80s, but it was not measured after the other. He had no chest pain, nausea, or vomiting. He has been able to burp and get out of bed to urinate, but he has not had a bowel movement. He has been drinking water and eating fruit. He feels well and wants to go home  today.   Objective: Vital signs in last 24 hours: Temp:  [97 F (36.1 C)-98.5 F (36.9 C)] 98.2 F (36.8 C) (07/19 0428) Pulse Rate:  [92-108] 108 (07/19 0428) Resp:  [14-21] 18 (07/19 0428) BP: (134-179)/(86-97) 161/95 (07/19 0428) SpO2:  [91 %-100 %] 96 % (07/19 0853) Weight:  [134.3 kg] 134.3 kg (07/18 1341) Last BM Date: 03/22/21  Intake/Output from previous day: 07/18 0701 - 07/19 0700 In: 1900 [P.O.:600; I.V.:1200; IV Piggyback:100] Out: 2555 [Urine:2550; Blood:5] Intake/Output this shift: No intake/output data recorded.  General appearance: alert, cooperative, and no distress Head: Normocephalic, without obvious abnormality, atraumatic Resp: clear to auscultation bilaterally Chest wall: no tenderness Cardio: mild tachycardia, normal rhythm GI: soft, non-tender; bowel sounds normal; no masses,  no organomegaly Skin: Skin color, texture, turgor normal. No rashes or lesions Neurologic: Grossly normal Incision/Wound:The incision sites appear dry and intact  Lab Results:  No results for input(s): WBC, HGB, HCT, PLT in the last 72 hours. BMET No results for input(s): NA, K, CL, CO2, GLUCOSE, BUN, CREATININE, CALCIUM in the last 72 hours. PT/INR No results for input(s): LABPROT, INR in the last 72 hours.  Studies/Results: No results found.  Anti-infectives: Anti-infectives (From admission, onward)    Start     Dose/Rate Route Frequency Ordered Stop   03/23/21 0645  cefoTEtan (CEFOTAN) 2 g in sodium chloride 0.9 % 100 mL IVPB        2 g 200 mL/hr over 30 Minutes Intravenous On call to O.R.  03/23/21 3838 03/23/21 0754   03/23/21 0634  sodium chloride 0.9 % with cefoTEtan (CEFOTAN) ADS Med       Note to Pharmacy: [name]   : cabinet override      03/23/21 0634 03/23/21 0739       Assessment/Plan: s/p Procedure(s): LAPAROSCOPIC CHOLECYSTECTOMY LAPAROSCOPIC LIVER BIOPSY    LOS: 0 days   Mr. Aria Pickrell is a 60 year old male with a past medical  history of hypertension, and chronic kidney disease who is postop day 1 following a laparoscopic cholecystectomy with a liver biopsy.   He is doing well and should be ready for discharge today. He should be walked again, and his oxygen saturation should be observed to see if it drops again. He will need Gas-X and a few oxycodone tablets to take PRN. His incision sites will need to be checked periodically to make sure there is appropriate healing. We will share the liver biopsy results, and he will follow-up as needed if there are any concerns.   Danie Chandler 03/24/2021

## 2021-03-24 NOTE — Progress Notes (Signed)
   03/24/21 2122  Vitals  Temp 99.1 F (37.3 C)  Temp Source Oral  BP (!) 126/97  MAP (mmHg) 108  BP Location Left Arm  BP Method Automatic  Patient Position (if appropriate) Sitting  Pulse Rate (!) 123  Pulse Rate Source Monitor  Resp 19  MEWS COLOR  MEWS Score Color Yellow  Oxygen Therapy  SpO2 92 %  O2 Device Nasal Cannula  MEWS Score  MEWS Temp 0  MEWS Systolic 0  MEWS Pulse 2  MEWS RR 0  MEWS LOC 0  MEWS Score 2  MD notified due to a MEWS yellow. Patients HR 123. Patient reports being in pain, will continue to monitor.

## 2021-03-25 ENCOUNTER — Observation Stay (HOSPITAL_COMMUNITY): Payer: 59

## 2021-03-25 LAB — COMPREHENSIVE METABOLIC PANEL
ALT: 67 U/L — ABNORMAL HIGH (ref 0–44)
AST: 53 U/L — ABNORMAL HIGH (ref 15–41)
Albumin: 3.8 g/dL (ref 3.5–5.0)
Alkaline Phosphatase: 104 U/L (ref 38–126)
Anion gap: 11 (ref 5–15)
BUN: 30 mg/dL — ABNORMAL HIGH (ref 8–23)
CO2: 25 mmol/L (ref 22–32)
Calcium: 9.6 mg/dL (ref 8.9–10.3)
Chloride: 100 mmol/L (ref 98–111)
Creatinine, Ser: 2.2 mg/dL — ABNORMAL HIGH (ref 0.61–1.24)
GFR, Estimated: 33 mL/min — ABNORMAL LOW (ref >60)
Glucose, Bld: 211 mg/dL — ABNORMAL HIGH (ref 70–99)
Potassium: 4.8 mmol/L (ref 3.5–5.1)
Sodium: 136 mmol/L (ref 135–145)
Total Bilirubin: 1.5 mg/dL — ABNORMAL HIGH (ref 0.3–1.2)
Total Protein: 9 g/dL — ABNORMAL HIGH (ref 6.5–8.1)

## 2021-03-25 LAB — CBC WITH DIFFERENTIAL/PLATELET
Abs Immature Granulocytes: 0.17 10*3/uL — ABNORMAL HIGH (ref 0.00–0.07)
Basophils Absolute: 0.1 10*3/uL (ref 0.0–0.1)
Basophils Relative: 0 %
Eosinophils Absolute: 0 10*3/uL (ref 0.0–0.5)
Eosinophils Relative: 0 %
HCT: 43.5 % (ref 39.0–52.0)
Hemoglobin: 13.9 g/dL (ref 13.0–17.0)
Immature Granulocytes: 1 %
Lymphocytes Relative: 7 %
Lymphs Abs: 1.1 10*3/uL (ref 0.7–4.0)
MCH: 27.7 pg (ref 26.0–34.0)
MCHC: 32 g/dL (ref 30.0–36.0)
MCV: 86.8 fL (ref 80.0–100.0)
Monocytes Absolute: 1.4 10*3/uL — ABNORMAL HIGH (ref 0.1–1.0)
Monocytes Relative: 9 %
Neutro Abs: 13.8 10*3/uL — ABNORMAL HIGH (ref 1.7–7.7)
Neutrophils Relative %: 83 %
Platelets: 231 10*3/uL (ref 150–400)
RBC: 5.01 MIL/uL (ref 4.22–5.81)
RDW: 14.7 % (ref 11.5–15.5)
WBC: 16.7 10*3/uL — ABNORMAL HIGH (ref 4.0–10.5)
nRBC: 0 % (ref 0.0–0.2)

## 2021-03-25 LAB — SURGICAL PATHOLOGY

## 2021-03-25 NOTE — Progress Notes (Signed)
[facility] Progress Note  2 Days Post-Op  Subjective: Patient remains on and off O2. Dr. Melvyn Novas has seen him socially but may have to see him officially. CXR with atelectasis. Pain improves.   Objective: Vital signs in last 24 hours: Temp:  [98.3 F (36.8 C)-99.6 F (37.6 C)] 99.6 F (37.6 C) (07/20 1302) Pulse Rate:  [110-123] 111 (07/20 1302) Resp:  [18-20] 18 (07/20 1302) BP: (111-147)/(81-97) 111/81 (07/20 1302) SpO2:  [91 %-95 %] 91 % (07/20 1953) Last BM Date: 03/23/21  Intake/Output from previous day: 07/19 0701 - 07/20 0700 In: -  Out: 1550 [Urine:1550] Intake/Output this shift: No intake/output data recorded.  General appearance: alert and no distress Resp: normal work of breathing, 3L O2 GI: sitting up in chair, no complaints  Lab Results:  Recent Labs    03/25/21 0539  WBC 16.7*  HGB 13.9  HCT 43.5  PLT 231   BMET Recent Labs    03/25/21 0539  NA 136  K 4.8  CL 100  CO2 25  GLUCOSE 211*  BUN 30*  CREATININE 2.20*  CALCIUM 9.6   PT/INR No results for input(s): LABPROT, INR in the last 72 hours.  Studies/Results: DG Chest Port 1 View  Result Date: 03/25/2021 CLINICAL DATA:  Oxygen desaturation. EXAM: PORTABLE CHEST 1 VIEW COMPARISON:  Chest CT 01/14/2021.  Radiograph 11/26/2019 FINDINGS: Emphysema with apical blebs and multifocal scarring. Upper normal heart size is likely accentuated by technique. Streaky opacities at the left lung base in addition to scarring. No pulmonary edema. No significant pleural effusion. No pneumothorax. No acute osseous abnormalities are seen. IMPRESSION: 1. Emphysema with apical blebs and multifocal scarring. 2. Streaky opacities at the left lung base in addition to scarring, likely atelectasis. This could represent infection in the appropriate clinical setting. Electronically Signed   By: Keith Rake M.D.   On: 03/25/2021 17:38    Anti-infectives: Anti-infectives (From admission, onward)     Start     Dose/Rate Route Frequency Ordered Stop   03/23/21 0645  cefoTEtan (CEFOTAN) 2 g in sodium chloride 0.9 % 100 mL IVPB        2 g 200 mL/hr over 30 Minutes Intravenous On call to O.R. 03/23/21 [id number] 03/23/21 0754   03/23/21 0634  sodium chloride 0.9 % with cefoTEtan (CEFOTAN) ADS Med       Note to Pharmacy: [name]   : cabinet override      03/23/21 0634 03/23/21 0739       Assessment/Plan: Mathew Lara is a 60 yo with some interstitial lung disease who has been post op from cholecystectomy but requiring O2 on and off. RN have tried to wean it. Encouraged IS and some atelectasis on CXR. Leukocytosis likely post op, Lft from burning gallbladder from liver. Pathology all reassuring, no polyp or cancer. Hepatitis chronic.  Diet as tolerated Updated daughter If not off O2 in AM will ask pulmonary to see officially before we make any plans for home O2    LOS: 0 days    [name] 03/25/2021

## 2021-03-25 NOTE — Progress Notes (Signed)
Patient sitting up in the chair for his dinner. Provided pain medication. Dr. Constance Haw in to see patient. He is on 3l/[place] at this time, titrated back down to 2l, will continue to monitor patient's o2. A chest xray was ordered by Dr. Constance Haw and patient results discussed at bedside. Patient is aware of the plan for this evening, and agreeable. No further needs expressed at this time.

## 2021-03-25 NOTE — Progress Notes (Signed)
Patient tolerated breakfast well, did not want to eat lunch. Per patient, pain has been better. He has been on o2 this admission. Patient's daughter called for an update. Provided.

## 2021-03-26 DIAGNOSIS — Z7982 Long term (current) use of aspirin: Secondary | ICD-10-CM | POA: Diagnosis not present

## 2021-03-26 DIAGNOSIS — G4733 Obstructive sleep apnea (adult) (pediatric): Secondary | ICD-10-CM

## 2021-03-26 DIAGNOSIS — K824 Cholesterolosis of gallbladder: Secondary | ICD-10-CM | POA: Diagnosis present

## 2021-03-26 DIAGNOSIS — J9621 Acute and chronic respiratory failure with hypoxia: Secondary | ICD-10-CM

## 2021-03-26 DIAGNOSIS — Z8616 Personal history of COVID-19: Secondary | ICD-10-CM | POA: Diagnosis not present

## 2021-03-26 DIAGNOSIS — Z87891 Personal history of nicotine dependence: Secondary | ICD-10-CM | POA: Diagnosis not present

## 2021-03-26 DIAGNOSIS — Z79899 Other long term (current) drug therapy: Secondary | ICD-10-CM | POA: Diagnosis not present

## 2021-03-26 DIAGNOSIS — B182 Chronic viral hepatitis C: Secondary | ICD-10-CM | POA: Diagnosis present

## 2021-03-26 DIAGNOSIS — Z9989 Dependence on other enabling machines and devices: Secondary | ICD-10-CM

## 2021-03-26 DIAGNOSIS — J849 Interstitial pulmonary disease, unspecified: Secondary | ICD-10-CM | POA: Diagnosis present

## 2021-03-26 DIAGNOSIS — N179 Acute kidney failure, unspecified: Secondary | ICD-10-CM | POA: Diagnosis not present

## 2021-03-26 DIAGNOSIS — I129 Hypertensive chronic kidney disease with stage 1 through stage 4 chronic kidney disease, or unspecified chronic kidney disease: Secondary | ICD-10-CM | POA: Diagnosis present

## 2021-03-26 DIAGNOSIS — K219 Gastro-esophageal reflux disease without esophagitis: Secondary | ICD-10-CM | POA: Diagnosis present

## 2021-03-26 DIAGNOSIS — F419 Anxiety disorder, unspecified: Secondary | ICD-10-CM | POA: Diagnosis present

## 2021-03-26 DIAGNOSIS — J95821 Acute postprocedural respiratory failure: Secondary | ICD-10-CM | POA: Diagnosis not present

## 2021-03-26 DIAGNOSIS — K760 Fatty (change of) liver, not elsewhere classified: Secondary | ICD-10-CM | POA: Diagnosis present

## 2021-03-26 DIAGNOSIS — Z96643 Presence of artificial hip joint, bilateral: Secondary | ICD-10-CM | POA: Diagnosis present

## 2021-03-26 DIAGNOSIS — Z6841 Body Mass Index (BMI) 40.0 and over, adult: Secondary | ICD-10-CM | POA: Diagnosis not present

## 2021-03-26 DIAGNOSIS — G8929 Other chronic pain: Secondary | ICD-10-CM | POA: Diagnosis present

## 2021-03-26 DIAGNOSIS — J449 Chronic obstructive pulmonary disease, unspecified: Secondary | ICD-10-CM | POA: Diagnosis present

## 2021-03-26 DIAGNOSIS — E782 Mixed hyperlipidemia: Secondary | ICD-10-CM | POA: Diagnosis present

## 2021-03-26 DIAGNOSIS — N1832 Chronic kidney disease, stage 3b: Secondary | ICD-10-CM | POA: Diagnosis present

## 2021-03-26 LAB — CBC WITH DIFFERENTIAL/PLATELET
Abs Immature Granulocytes: 0.12 10*3/uL — ABNORMAL HIGH (ref 0.00–0.07)
Basophils Absolute: 0.1 10*3/uL (ref 0.0–0.1)
Basophils Relative: 1 %
Eosinophils Absolute: 0.4 10*3/uL (ref 0.0–0.5)
Eosinophils Relative: 3 %
HCT: 43.5 % (ref 39.0–52.0)
Hemoglobin: 13.7 g/dL (ref 13.0–17.0)
Immature Granulocytes: 1 %
Lymphocytes Relative: 13 %
Lymphs Abs: 1.7 10*3/uL (ref 0.7–4.0)
MCH: 27.9 pg (ref 26.0–34.0)
MCHC: 31.5 g/dL (ref 30.0–36.0)
MCV: 88.6 fL (ref 80.0–100.0)
Monocytes Absolute: 1.1 10*3/uL — ABNORMAL HIGH (ref 0.1–1.0)
Monocytes Relative: 9 %
Neutro Abs: 9.9 10*3/uL — ABNORMAL HIGH (ref 1.7–7.7)
Neutrophils Relative %: 73 %
Platelets: 277 10*3/uL (ref 150–400)
RBC: 4.91 MIL/uL (ref 4.22–5.81)
RDW: 15 % (ref 11.5–15.5)
WBC: 13.3 10*3/uL — ABNORMAL HIGH (ref 4.0–10.5)
nRBC: 0 % (ref 0.0–0.2)

## 2021-03-26 LAB — COMPREHENSIVE METABOLIC PANEL
ALT: 44 U/L (ref 0–44)
AST: 29 U/L (ref 15–41)
Albumin: 3.7 g/dL (ref 3.5–5.0)
Alkaline Phosphatase: 99 U/L (ref 38–126)
Anion gap: 12 (ref 5–15)
BUN: 38 mg/dL — ABNORMAL HIGH (ref 8–23)
CO2: 23 mmol/L (ref 22–32)
Calcium: 9.4 mg/dL (ref 8.9–10.3)
Chloride: 99 mmol/L (ref 98–111)
Creatinine, Ser: 2.54 mg/dL — ABNORMAL HIGH (ref 0.61–1.24)
GFR, Estimated: 28 mL/min — ABNORMAL LOW (ref >60)
Glucose, Bld: 150 mg/dL — ABNORMAL HIGH (ref 70–99)
Potassium: 4.4 mmol/L (ref 3.5–5.1)
Sodium: 134 mmol/L — ABNORMAL LOW (ref 135–145)
Total Bilirubin: 1 mg/dL (ref 0.3–1.2)
Total Protein: 9.2 g/dL — ABNORMAL HIGH (ref 6.5–8.1)

## 2021-03-26 NOTE — Progress Notes (Signed)
Pt on home CPAP unit spo2 81% with no o2 . RT added 2lpm [place] to home CPAP unit spo2 inncreased to 94%.

## 2021-03-26 NOTE — Progress Notes (Signed)
[facility]  Doing ok and feeling ok but still on O2. Dr. Melvyn Novas saw him and feels like he needs to be back on his home CPAP. Will make sure he gets this for tonight.   Hopefully able to go home tomorrow.  BP 110/82 (BP Location: Right Arm)   Pulse (!) 109   Temp 97.8 F (36.6 C) (Oral)   Resp 18   Ht 5\' 9"  (1.753 m)   Wt 134.3 kg   SpO2 91%   BMI 43.71 kg/m  NAD O2 3L Port sites c/d/I with dermabond, appropriate pain  Cr up versus around baseline. Eating appropriately and drinking.  Will repeat BMP tomorrow. Continue IS.   Tried to call [name] but went to VM. Will get RN to get in touch with her about getting CPAP from home for tonight.   Curlene Labrum, MD [facility] [address] [place], [place] [address] [phone] (office)

## 2021-03-26 NOTE — Consult Note (Signed)
NAME:  Mathew Lara, MRN:  [id number], DOB:  06/20/1961, LOS: 0 ADMISSION DATE:  03/23/2021, CONSULTATION DATE:  7/21 REFERRING MD:  Constance Haw, CHIEF COMPLAINT:  atx post op   History of Present Illness:  12 yobm quit smoking 6 y PTA with mild copd/post covid ILD/ OSA on CPAP followed by Dr Halford Chessman with baseline = able to walk on treamill up to 1.7 mph x 45 min s 02 / sleeps on cpap, not 02 but not able to tolerate cpap mask in hosp   S/p Laparoscopic cholecystectomy, wedge liver biopsy on 7/18 with post op hypoxemic resp failure with minimal atx on cxr 7/20 and PCCM asked to see am 7/21   Pertinent  Medical History  Alcohol abuse Anxiety Chronic hip pain  CKD (chronic kidney disease) stage 3, GFR 30-59 ml/min   COPD   Essential hypertension  Hepatitis C Mixed hyperlipidemia  Sleep apnea.   Significant Hospital Events: Including procedures, antibiotic start and stop dates in addition to other pertinent events       Scheduled Meds:  acetaminophen  1,000 mg Oral Q6H   amLODipine  10 mg Oral Daily   docusate sodium  100 mg Oral BID   hydrOXYzine  50 mg Oral QHS   loratadine  10 mg Oral Daily   losartan  50 mg Oral Daily   metoprolol tartrate  5 mg Intravenous Once   mometasone-formoterol  2 puff Inhalation BID   pantoprazole (PROTONIX) IV  40 mg Intravenous QHS   sodium bicarbonate  650 mg Oral TID   Continuous Infusions: PRN Meds:.albuterol, diphenhydrAMINE **OR** diphenhydrAMINE, ibuprofen, metoprolol tartrate, morphine injection, ondansetron **OR** ondansetron (ZOFRAN) IV, oxyCODONE, simethicone     Interim History / Subjective:  Up in chair using IS earlier, now back in bed and back on 2lpm NP due to desats when fell asleep  Objective   Blood pressure (!) 129/94, pulse 100, temperature 98.2 F (36.8 C), temperature source Oral, resp. rate 18, height 5\' 9"  (1.753 m), weight 134.3 kg, SpO2 96 %.        Intake/Output Summary (Last 24 hours) at 03/26/2021 0840 Last  data filed at 03/25/2021 1834 Gross per 24 hour  Intake 720 ml  Output 1200 ml  Net -480 ml   Filed Weights   03/23/21 1341  Weight: 134.3 kg    Examination: General: obese bm with hob up 30 degrees/ nad Sats 91% on 2lpm  HENT: orophx clear Lungs: decreased bs in bases, no wheeze  Cardiovascular: RRR no s 3 Abdomen: obese, gen mild tenderness s rebound Extremities: warm s calf tenderness or edema Neuro: alert/ approp s motor def apparent       I personally reviewed images and agree with radiology impression as follows:  CXR:   portable 7/20 1. Emphysema with apical blebs and multifocal scarring. 2. Streaky opacities at the left lung base in addition to scarring, likely atelectasis  Assessment & Plan:  Post op atx bases> hypoxemic resp failure mostly due to obesity/ pf sp covid/ copd  and exac by lack of cpap post op setting  >>> advised to bring his equipment from home for tonight if possible >>> continue IS/ mobilization, minimal narcs  2) AB on advair preop >>> 03/10/2021  After extensive coaching inhaler device,  effectiveness =  75% so ok to continue dulera 2002bid post op and back to advair on d/c     3) OSA on Cpap >> resume home settings/ equipment asap   4) AKI  Lab Results  Component Value Date   CREATININE 2.54 (H) 03/26/2021   CREATININE 2.20 (H) 03/25/2021   CREATININE 2.02 (H) 02/03/2021  >>> min change vs baseline so avoid nephrotoxins and monitor   Labs   CBC: Recent Labs  Lab 03/25/21 0539  WBC 16.7*  NEUTROABS 13.8*  HGB 13.9  HCT 43.5  MCV 86.8  PLT 194    Basic Metabolic Panel: Recent Labs  Lab 03/25/21 0539  NA 136  K 4.8  CL 100  CO2 25  GLUCOSE 211*  BUN 30*  CREATININE 2.20*  CALCIUM 9.6   GFR: Estimated Creatinine Clearance: 47.9 mL/min (A) (by C-G formula based on SCr of 2.2 mg/dL (H)). Recent Labs  Lab 03/25/21 0539  WBC 16.7*    Liver Function Tests: Recent Labs  Lab 03/25/21 0539  AST 53*  ALT 67*   ALKPHOS 104  BILITOT 1.5*  PROT 9.0*  ALBUMIN 3.8   No results for input(s): LIPASE, AMYLASE in the last 168 hours. No results for input(s): AMMONIA in the last 168 hours.  ABG    Component Value Date/Time   PHART 7.343 (L) 09/28/2019 1312   PCO2ART 45.5 09/28/2019 1312   PO2ART 78.5 (L) 09/28/2019 1312   HCO3 24.1 09/28/2019 1312   ACIDBASEDEF 1.3 09/28/2019 1312   O2SAT 94.1 09/28/2019 1312     Coagulation Profile: No results for input(s): INR, PROTIME in the last 168 hours.  Cardiac Enzymes: No results for input(s): CKTOTAL, CKMB, CKMBINDEX, TROPONINI in the last 168 hours.  HbA1C: No results found for: HGBA1C  CBG: No results for input(s): GLUCAP in the last 168 hours.     Past Medical History:  He,  has a past medical history of Alcohol abuse, Anxiety, Bronchitis, Chronic hip pain, CKD (chronic kidney disease) stage 3, GFR 30-59 ml/min (HCC), COPD (chronic obstructive pulmonary disease) ([place]), Essential hypertension, Hepatitis C, Mixed hyperlipidemia, and Sleep apnea.   Surgical History:   Past Surgical History:  Procedure Laterality Date   CATARACT EXTRACTION Right    CHOLECYSTECTOMY N/A 03/23/2021   Procedure: LAPAROSCOPIC CHOLECYSTECTOMY;  Surgeon: [name], MD;  Location: [facility];  Service: General;  Laterality: N/A;   COLONOSCOPY N/A 01/23/2014   SLF:The LEFT COLON IS EXTREMELY redundant/TWO RECTAL POLYPS REMOVED/SMALL VOLUME RECTAL BLEEDING MOST LIKELY DUE TO Small internal hemorrhoids   ESOPHAGOGASTRODUODENOSCOPY N/A 01/23/2014   RDE:YCXKGYJE ring at the gastroesophageal junction/Medium sized hiatal hernia/NDYSPEPSIA MOST LIKELY DUE TO GERD/MILD Non-erosive gastritis   ESOPHAGOGASTRODUODENOSCOPY N/A 04/18/2018   Procedure: ESOPHAGOGASTRODUODENOSCOPY (EGD);  Surgeon: Daneil Dolin, MD;  Location: [facility];  Service: Endoscopy;  Laterality: N/A;   HEMORRHOID BANDING  01/23/2014   Procedure: HEMORRHOID BANDING;  Surgeon: Danie Binder, MD;   Location: [facility];  Service: Endoscopy;;   JOINT REPLACEMENT Right 2017   LIVER BIOPSY N/A 03/23/2021   Procedure: LAPAROSCOPIC LIVER BIOPSY;  Surgeon: [name], MD;  Location: [facility];  Service: General;  Laterality: N/A;   MOUTH SURGERY     TOTAL HIP ARTHROPLASTY Right 06/04/2016   Procedure: RIGHT TOTAL HIP ARTHROPLASTY ANTERIOR APPROACH;  Surgeon: Mcarthur Rossetti, MD;  Location: [facility];  Service: Orthopedics;  Laterality: Right;   TOTAL HIP ARTHROPLASTY Left 03/02/2019   Procedure: LEFT TOTAL HIP ARTHROPLASTY ANTERIOR APPROACH;  Surgeon: Mcarthur Rossetti, MD;  Location: [facility];  Service: Orthopedics;  Laterality: Left;     Social History:   reports that he quit smoking about 6 years ago. His smoking use  included cigarettes. He started smoking about 49 years ago. He has a 42.00 pack-year smoking history. He has never used smokeless tobacco. He reports previous alcohol use. He reports that he does not use drugs.   Family History:  His family history includes Lung cancer in his mother.   Allergies No Known Allergies   Home Medications  Prior to Admission medications   Medication Sig Start Date End Date Taking? Authorizing Provider  amLODipine (NORVASC) 10 MG tablet Take 10 mg by mouth daily. 02/05/19  Yes [provider]  ascorbic acid (VITAMIN C) 500 MG tablet Take 1 tablet (500 mg total) by mouth daily. 09/03/19  Yes Barton Dubois, MD  aspirin EC 81 MG tablet Take 81 mg by mouth daily.   Yes [provider]  cholecalciferol (VITAMIN D) 25 MCG (1000 UNIT) tablet Take 1,000 Units by mouth daily.   Yes [provider]  fluticasone-salmeterol (ADVAIR) 250-50 MCG/ACT AEPB Inhale 1 puff into the lungs every 12 (twelve) hours. 01/30/21  Yes Chesley Mires, MD  hydrOXYzine (ATARAX/VISTARIL) 25 MG tablet Take 50 mg by mouth at bedtime.   Yes [provider]  loratadine (CLARITIN) 10 MG tablet Take 1 tablet (10 mg total) by mouth daily.  09/02/19  Yes Barton Dubois, MD  losartan (COZAAR) 50 MG tablet Take 50 mg by mouth daily. 02/11/20  Yes [provider]  oxyCODONE (OXY IR/ROXICODONE) 5 MG immediate release tablet Take 5 mg by mouth every 8 (eight) hours as needed for moderate pain. 02/25/20  Yes [provider]  PROAIR HFA 108 (90 Base) MCG/ACT inhaler Inhale 2 puffs into the lungs every 4 (four) hours as needed for wheezing or shortness of breath. Patient taking differently: Inhale 2 puffs into the lungs every 6 (six) hours as needed for wheezing or shortness of breath. 01/30/21  Yes Chesley Mires, MD  sodium bicarbonate 650 MG tablet Take 650 mg by mouth 3 (three) times daily.   Yes [provider]  acetaminophen (TYLENOL) 325 MG tablet Take 2 tablets (650 mg total) by mouth every 6 (six) hours as needed for mild pain or headache (fever >/= 101). Patient not taking: Reported on 03/13/2021 09/20/19   Roxan Hockey, MD  zinc sulfate 220 (50 Zn) MG capsule Take 1 capsule (220 mg total) by mouth daily. Patient not taking: Reported on 03/13/2021 09/03/19   Barton Dubois, MD       Christinia Gully, MD [facility] [place] [phone]   After 7:00 pm call Elink  [phone]

## 2021-03-27 DIAGNOSIS — J9621 Acute and chronic respiratory failure with hypoxia: Secondary | ICD-10-CM | POA: Diagnosis not present

## 2021-03-27 DIAGNOSIS — G4733 Obstructive sleep apnea (adult) (pediatric): Secondary | ICD-10-CM | POA: Diagnosis not present

## 2021-03-27 DIAGNOSIS — J449 Chronic obstructive pulmonary disease, unspecified: Secondary | ICD-10-CM | POA: Diagnosis not present

## 2021-03-27 DIAGNOSIS — Z9989 Dependence on other enabling machines and devices: Secondary | ICD-10-CM | POA: Diagnosis not present

## 2021-03-27 LAB — CREATININE, URINE, RANDOM: Creatinine, Urine: 267.24 mg/dL

## 2021-03-27 LAB — BASIC METABOLIC PANEL
Anion gap: 11 (ref 5–15)
BUN: 51 mg/dL — ABNORMAL HIGH (ref 8–23)
CO2: 24 mmol/L (ref 22–32)
Calcium: 9.3 mg/dL (ref 8.9–10.3)
Chloride: 103 mmol/L (ref 98–111)
Creatinine, Ser: 2.83 mg/dL — ABNORMAL HIGH (ref 0.61–1.24)
GFR, Estimated: 25 mL/min — ABNORMAL LOW (ref >60)
Glucose, Bld: 163 mg/dL — ABNORMAL HIGH (ref 70–99)
Potassium: 4.1 mmol/L (ref 3.5–5.1)
Sodium: 138 mmol/L (ref 135–145)

## 2021-03-27 LAB — SODIUM, URINE, RANDOM: Sodium, Ur: 40 mmol/L

## 2021-03-27 MED ORDER — LACTATED RINGERS IV BOLUS
500.0000 mL | Freq: Once | INTRAVENOUS | Status: AC
  Administered 2021-03-27: 500 mL via INTRAVENOUS

## 2021-03-27 MED ORDER — POLYETHYLENE GLYCOL 3350 17 G PO PACK
17.0000 g | PACK | Freq: Once | ORAL | Status: AC
  Administered 2021-03-27: 17 g via ORAL
  Filled 2021-03-27: qty 1

## 2021-03-27 NOTE — Progress Notes (Signed)
Patient on RA, 91%, using his incentive spirometry device. Camille on phone with him (Patient's daughter). Spoke with her and gave update.

## 2021-03-27 NOTE — Progress Notes (Signed)
NAME:  Mathew Lara, MRN:  [id number], DOB:  [birth year], LOS: 1 ADMISSION DATE:  03/23/2021, CONSULTATION DATE:  7/21 REFERRING MD:  Constance Haw, CHIEF COMPLAINT:  atx post op   History of Present Illness:  30 yobm quit smoking 6 y PTA with mild copd/post covid ILD/ OSA on CPAP followed by Dr Halford Chessman with baseline = able to walk on treamill up to 1.7 mph x 45 min s 02 / sleeps on cpap, not 02 but not able to tolerate cpap mask in hosp   S/p Laparoscopic cholecystectomy, wedge liver biopsy on 7/18 with post op hypoxemic resp failure with minimal atx on cxr 7/20 and PCCM asked to see am 7/21   Pertinent  Medical History  Alcohol abuse Anxiety Chronic hip pain  CKD (chronic kidney disease) stage 3, GFR 30-59 ml/min   COPD   Essential hypertension  Hepatitis C Mixed hyperlipidemia  Sleep apnea.   Significant Hospital Events: Including procedures, antibiotic start and stop dates in addition to other pertinent events       Scheduled Meds:  acetaminophen  1,000 mg Oral Q6H   amLODipine  10 mg Oral Daily   docusate sodium  100 mg Oral BID   hydrOXYzine  50 mg Oral QHS   loratadine  10 mg Oral Daily   losartan  50 mg Oral Daily   metoprolol tartrate  5 mg Intravenous Once   mometasone-formoterol  2 puff Inhalation BID   pantoprazole (PROTONIX) IV  40 mg Intravenous QHS   sodium bicarbonate  650 mg Oral TID   Continuous Infusions: PRN Meds:.albuterol, diphenhydrAMINE **OR** diphenhydrAMINE, metoprolol tartrate, morphine injection, ondansetron **OR** ondansetron (ZOFRAN) IV, oxyCODONE, simethicone     Interim History / Subjective:  Says slept fine most of night off 02 and on home nasal cpap though intially on set up needed 2lpm on the cpap   Objective   Blood pressure (!) 134/93, pulse 99, temperature 98.3 F (36.8 C), temperature source Oral, resp. rate 20, height 5\' 9"  (1.753 m), weight 134.3 kg, SpO2 93 %.        Intake/Output Summary (Last 24 hours) at 03/27/2021 0833 Last  data filed at 03/26/2021 1951 Gross per 24 hour  Intake 320 ml  Output 700 ml  Net -380 ml   Filed Weights   03/23/21 1341  Weight: 134.3 kg    Examination: Tmax  98.3  General appearance:    obes bm lying down at 30 degrees hob on nasal cpap/ no 02   At Rest 02 sats  93% on RA on home cpap   No jvd Oropharynx clear,  mucosa nl Neck supple Lungs with distant bs bilaterally RRR no s3 or or sign murmur Abd obese with limited  excursion  Extr warm with no edema or clubbing noted Neuro  Sensorium intact ,  no apparent motor deficits     I personally reviewed images and agree with radiology impression as follows:  CXR:   portable 7/20 1. Emphysema with apical blebs and multifocal scarring. 2. Streaky opacities at the left lung base in addition to scarring, likely atelectasis  Assessment & Plan:  Post op atx bases> hypoxemic resp failure mostly due to obesity/ pf sp covid/ copd  and exac by lack of cpap post op setting  >>> ok to d/c on his cpap after up on ra and walks in hallway to be sure no 02 needed (he was walking on a treadmill before admit off 02)  >>> continue IS/ mobilization, minimal  narcs  2) AB on advair preop >>>  continue dulera 2002bid post op and back to advair on d/c     3) OSA on Cpap >> continue home settings/ equipment asap  >>> f/u Dr Halford Chessman as planned, call sooner if needed  PCCM can see again as intp prn    4) AKI Lab Results  Component Value Date   CREATININE 2.83 (H) 03/27/2021   CREATININE 2.54 (H) 03/26/2021   CREATININE 2.20 (H) 03/25/2021  >>> min change vs baseline so avoid nephrotoxins and monitor   Labs   CBC: Recent Labs  Lab 03/25/21 0539 03/26/21 0813  WBC 16.7* 13.3*  NEUTROABS 13.8* 9.9*  HGB 13.9 13.7  HCT 43.5 43.5  MCV 86.8 88.6  PLT 231 641    Basic Metabolic Panel: Recent Labs  Lab 03/25/21 0539 03/26/21 0813 03/27/21 0531  NA 136 134* 138  K 4.8 4.4 4.1  CL 100 99 103  CO2 25 23 24   GLUCOSE 211* 150*  163*  BUN 30* 38* 51*  CREATININE 2.20* 2.54* 2.83*  CALCIUM 9.6 9.4 9.3   GFR: Estimated Creatinine Clearance: 37.3 mL/min (A) (by C-G formula based on SCr of 2.83 mg/dL (H)). Recent Labs  Lab 03/25/21 0539 03/26/21 0813  WBC 16.7* 13.3*    Liver Function Tests: Recent Labs  Lab 03/25/21 0539 03/26/21 0813  AST 53* 29  ALT 67* 44  ALKPHOS 104 99  BILITOT 1.5* 1.0  PROT 9.0* 9.2*  ALBUMIN 3.8 3.7   No results for input(s): LIPASE, AMYLASE in the last 168 hours. No results for input(s): AMMONIA in the last 168 hours.  ABG    Component Value Date/Time   PHART 7.343 (L) 09/28/2019 1312   PCO2ART 45.5 09/28/2019 1312   PO2ART 78.5 (L) 09/28/2019 1312   HCO3 24.1 09/28/2019 1312   ACIDBASEDEF 1.3 09/28/2019 1312   O2SAT 94.1 09/28/2019 1312     Coagulation Profile: No results for input(s): INR, PROTIME in the last 168 hours.  Cardiac Enzymes: No results for input(s): CKTOTAL, CKMB, CKMBINDEX, TROPONINI in the last 168 hours.  HbA1C: No results found for: HGBA1C  CBG: No results for input(s): GLUCAP in the last 168 hours.

## 2021-03-27 NOTE — Progress Notes (Signed)
Patient continues to use his incentive spirometry, o2 remaining stable on RA. Does tire easily after sitting up for a while. Assisted back to bed after sitting up for an hour.

## 2021-03-27 NOTE — Progress Notes (Signed)
[facility] Progress Note  4 Days Post-Op  Subjective: O2 improving with CPAP last night. RA today but having issues with getting tired with walking. Has not walked the halls. Acute on chronic kidney injury is prerenal as FeNa was 0.3.  500cc bolus given over 2 hrs.   Eating.   Objective: Vital signs in last 24 hours: Temp:  [98.2 F (36.8 C)-98.5 F (36.9 C)] 98.5 F (36.9 C) (07/22 1413) Pulse Rate:  [96-106] 96 (07/22 1413) Resp:  [18-20] 18 (07/22 1413) BP: (129-134)/(86-93) 132/86 (07/22 1413) SpO2:  [81 %-94 %] 93 % (07/22 1413) Last BM Date: 03/23/21  Intake/Output from previous day: 07/21 0701 - 07/22 0700 In: 320 [P.O.:320] Out: 700 [Urine:700] Intake/Output this shift: Total I/O In: 860 [P.O.:860] Out: 700 [Urine:700]  General appearance: alert, cooperative, and no distress Resp: O2 off  GI: soft, port sites with dermabond, appropriately tender  Lab Results:  Recent Labs    03/25/21 0539 03/26/21 0813  WBC 16.7* 13.3*  HGB 13.9 13.7  HCT 43.5 43.5  PLT 231 277   BMET Recent Labs    03/26/21 0813 03/27/21 0531  NA 134* 138  K 4.4 4.1  CL 99 103  CO2 23 24  GLUCOSE 150* 163*  BUN 38* 51*  CREATININE 2.54* 2.83*  CALCIUM 9.4 9.3   PT/INR No results for input(s): LABPROT, INR in the last 72 hours.  Studies/Results: DG Chest Port 1 View  Result Date: 03/25/2021 CLINICAL DATA:  Oxygen desaturation. EXAM: PORTABLE CHEST 1 VIEW COMPARISON:  Chest CT 01/14/2021.  Radiograph 11/26/2019 FINDINGS: Emphysema with apical blebs and multifocal scarring. Upper normal heart size is likely accentuated by technique. Streaky opacities at the left lung base in addition to scarring. No pulmonary edema. No significant pleural effusion. No pneumothorax. No acute osseous abnormalities are seen. IMPRESSION: 1. Emphysema with apical blebs and multifocal scarring. 2. Streaky opacities at the left lung base in addition to scarring, likely atelectasis.  This could represent infection in the appropriate clinical setting. Electronically Signed   By: Keith Rake M.D.   On: 03/25/2021 17:38    Anti-infectives: Anti-infectives (From admission, onward)    Start     Dose/Rate Route Frequency Ordered Stop   03/23/21 0645  cefoTEtan (CEFOTAN) 2 g in sodium chloride 0.9 % 100 mL IVPB        2 g 200 mL/hr over 30 Minutes Intravenous On call to O.R. 03/23/21 9233 03/23/21 0754   03/23/21 0634  sodium chloride 0.9 % with cefoTEtan (CEFOTAN) ADS Med       Note to Pharmacy: [name]   : cabinet override      03/23/21 0634 03/23/21 0739       Assessment/Plan: Mathew Lara is a 60 yo with interstitial lung disease s/p lap cholecystectomy and liver biopsy. Doing better and off O2. Home CPAP tonight.  PRN for pain Soft diet Miralax today Cpap tonight Needs to walk in halls on RA and monitor PT given patient not able to walk in halls/ need to determine if he will need a SNF Updated Mathew Lara    LOS: 1 day    Mathew Lara 03/27/2021

## 2021-03-28 LAB — BASIC METABOLIC PANEL
Anion gap: 10 (ref 5–15)
BUN: 53 mg/dL — ABNORMAL HIGH (ref 8–23)
CO2: 26 mmol/L (ref 22–32)
Calcium: 9.3 mg/dL (ref 8.9–10.3)
Chloride: 103 mmol/L (ref 98–111)
Creatinine, Ser: 2.68 mg/dL — ABNORMAL HIGH (ref 0.61–1.24)
GFR, Estimated: 26 mL/min — ABNORMAL LOW (ref >60)
Glucose, Bld: 158 mg/dL — ABNORMAL HIGH (ref 70–99)
Potassium: 4.4 mmol/L (ref 3.5–5.1)
Sodium: 139 mmol/L (ref 135–145)

## 2021-03-28 MED ORDER — ONDANSETRON 4 MG PO TBDP: 4.0000 mg | ORAL_TABLET | Freq: Four times a day (QID) | ORAL | 0 refills | Status: DC | PRN

## 2021-03-28 MED ORDER — DOCUSATE SODIUM 100 MG PO CAPS: 100.0000 mg | ORAL_CAPSULE | Freq: Two times a day (BID) | ORAL | 0 refills | Status: DC

## 2021-03-28 NOTE — Discharge Summary (Signed)
Physician Discharge Summary  Patient ID: Mathew Lara MRN: [id number] DOB/AGE: [birth year] 60 y.o.  Admit date: 03/23/2021 Discharge date: 03/28/2021  Admission Diagnoses: Gallbladder polyp   Discharge Diagnoses:  Principal Problem:   Chronic cholecystitis Active Problems:   COPD (chronic obstructive pulmonary disease) ([place])   Acute on chronic respiratory failure with hypoxia (HCC)   Acute renal failure superimposed on stage 3b chronic kidney disease (HCC)   OSA on CPAP   Fatty liver  Pathology: FINAL MICROSCOPIC DIAGNOSIS:   A. GALLBLADDER, CHOLECYSTECTOMY:  - Chronic cholecystitis.  - Cholesterolosis.   B. 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  - Mild macrovesicular steatosis.   COMMENT:   B. Sections of liver reveal mild macrovesicular steatosis. There is  bridging fibrosis (highlighted by trichrome and reticulin stains), but  no nodule formation. There are mild portal lymphocytic infiltrates, with  only minimal activity. There is minimal lobular activity. There are no  abnormal deposits seen with PASD and no significant iron deposition seen  on iron stains.  Discharged Condition: good  Hospital Course: Mathew Lara is a 60 yo with interstitial lung disease who had his gallbladder removed and was admitted post operatively given his pulmonary disease after  discussed surgery with Dr. Halford Chessman.  He did well post operatively but did have some desaturations and needed O2. He had some low IS levels at first but with time and activity his saturation on RA improved. He was on his home CPAP, ambulating and he was evaluated by PT to ensure he did not require any help at home.  He was doing well and eating prior to discharge.  He had a little bit of AKI on his chronic kidney disease, likely from some dehydration, and this improved with fluids and he was urinating normally.    Consults: pulmonary/intensive care  Significant Diagnostic Studies:   Last metabolic panel Lab Results  Component Value Date   GLUCOSE 158 (H) 03/28/2021   NA 139 03/28/2021   K 4.4 03/28/2021   CL 103 03/28/2021   CO2 26 03/28/2021   BUN 53 (H) 03/28/2021   CREATININE 2.68 (H) 03/28/2021   GFRNONAA 26 (L) 03/28/2021   GFRAA >60 11/26/2019   CALCIUM 9.3 03/28/2021   PHOS 3.6 09/20/2019   PHOS 3.6 09/20/2019   PROT 9.2 (H) 03/26/2021   ALBUMIN 3.7 03/26/2021   LABGLOB 4.2 02/03/2021   AGRATIO 1.1 (L) 02/03/2021   BILITOT 1.0 03/26/2021   ALKPHOS 99 03/26/2021   AST 29 03/26/2021   ALT 44 03/26/2021   ANIONGAP 10 03/28/2021     Treatments: Laparoscopic cholecystectomy and liver biopsy 03/23/2021, O2 and pain management, fluids   Discharge Exam: Blood pressure (!) 154/86, pulse 94, temperature 98 F (36.7 C), temperature source Oral, resp. rate 18, height 5\' 9"  (1.753 m), weight 134.3 kg, SpO2 91 %. General appearance: alert, cooperative, and no distress Resp: normal work of breathing GI: soft, appropriately tender, port sites c/d/it with dermbaond  Disposition: Discharge disposition: 01-Home or Self Care      Discharge Instructions     Call MD for:  difficulty breathing, headache or visual disturbances   Complete by: As directed    Call MD for:  extreme fatigue   Complete by: As directed    Call MD for:  persistant dizziness or light-headedness   Complete by: As directed    Call MD for:  persistant nausea  and vomiting   Complete by: As directed    Call MD for:  redness, tenderness, or signs of infection (pain, swelling, redness, odor or green/yellow discharge around incision site)   Complete by: As directed    Call MD for:  severe uncontrolled pain   Complete by: As directed    Call MD for:  temperature >100.4   Complete by: As directed    Increase activity slowly   Complete by: As directed       Allergies as of 03/28/2021   No Known Allergies       Medication List     TAKE these medications    amLODipine 10 MG tablet Commonly known as: NORVASC Take 10 mg by mouth daily.   ascorbic acid 500 MG tablet Commonly known as: VITAMIN C Take 1 tablet (500 mg total) by mouth daily.   aspirin EC 81 MG tablet Take 81 mg by mouth daily.   cholecalciferol 25 MCG (1000 UNIT) tablet Commonly known as: VITAMIN D Take 1,000 Units by mouth daily.   docusate sodium 100 MG capsule Commonly known as: COLACE Take 1 capsule (100 mg total) by mouth 2 (two) times daily.   fluticasone-salmeterol 250-50 MCG/ACT Aepb Commonly known as: ADVAIR Inhale 1 puff into the lungs every 12 (twelve) hours.   hydrOXYzine 25 MG tablet Commonly known as: ATARAX/VISTARIL Take 50 mg by mouth at bedtime.   loratadine 10 MG tablet Commonly known as: CLARITIN Take 1 tablet (10 mg total) by mouth daily.   losartan 50 MG tablet Commonly known as: COZAAR Take 50 mg by mouth daily.   ondansetron 4 MG disintegrating tablet Commonly known as: ZOFRAN-ODT Take 1 tablet (4 mg total) by mouth every 6 (six) hours as needed for nausea.   oxyCODONE 5 MG immediate release tablet Commonly known as: Oxy IR/ROXICODONE Take 5 mg by mouth every 8 (eight) hours as needed for moderate pain.   sodium bicarbonate 650 MG tablet Take 650 mg by mouth 3 (three) times daily.       ASK your doctor about these medications    acetaminophen 325 MG tablet Commonly known as: TYLENOL Take 2 tablets (650 mg total) by mouth every 6 (six) hours as needed for mild pain or headache (fever >/= 101).   ProAir HFA 108 (90 Base) MCG/ACT inhaler Generic drug: albuterol Inhale 2 puffs into the lungs every 4 (four) hours as needed for wheezing or shortness of breath.   zinc sulfate 220 (50 Zn) MG capsule Take 1 capsule (220 mg total) by mouth daily.        Follow-up Information     [name], MD Follow up on 04/23/2021.   Specialty: General Surgery Why: post op phone  call; if you need to be seen in person call the office Contact information: [address] [place] [facility] [address] [phone]         Chesley Mires, MD Follow up.   Specialty: Pulmonary Disease Why: as scheduled or sooner if having issues Contact information: [address] [place] [address] [phone]                 Signed: [name] 03/28/2021, 1:48 PM

## 2021-03-28 NOTE — Plan of Care (Signed)
  Problem: Education: Goal: Knowledge of General Education information will improve Description: Including pain rating scale, medication(s)/side effects and non-pharmacologic comfort measures 03/28/2021 1322 by Emmaline Life, RN Outcome: Adequate for Discharge 03/28/2021 1321 by Emmaline Life, RN Outcome: Adequate for Discharge   Problem: Health Behavior/Discharge Planning: Goal: Ability to manage health-related needs will improve 03/28/2021 1322 by Emmaline Life, RN Outcome: Adequate for Discharge 03/28/2021 1321 by Emmaline Life, RN Outcome: Adequate for Discharge   Problem: Clinical Measurements: Goal: Ability to maintain clinical measurements within normal limits will improve 03/28/2021 1322 by Emmaline Life, RN Outcome: Adequate for Discharge 03/28/2021 1321 by Emmaline Life, RN Outcome: Adequate for Discharge Goal: Will remain free from infection 03/28/2021 1322 by Emmaline Life, RN Outcome: Adequate for Discharge 03/28/2021 1321 by Emmaline Life, RN Outcome: Adequate for Discharge Goal: Diagnostic test results will improve 03/28/2021 1322 by Emmaline Life, RN Outcome: Adequate for Discharge 03/28/2021 1321 by Emmaline Life, RN Outcome: Adequate for Discharge Goal: Respiratory complications will improve 03/28/2021 1322 by Emmaline Life, RN Outcome: Adequate for Discharge 03/28/2021 1321 by Emmaline Life, RN Outcome: Adequate for Discharge Goal: Cardiovascular complication will be avoided 03/28/2021 1322 by Emmaline Life, RN Outcome: Adequate for Discharge 03/28/2021 1321 by Emmaline Life, RN Outcome: Adequate for Discharge   Problem: Activity: Goal: Risk for activity intolerance will decrease 03/28/2021 1322 by Emmaline Life, RN Outcome: Adequate for Discharge 03/28/2021 1321 by Emmaline Life, RN Outcome: Adequate for Discharge   Problem: Nutrition: Goal: Adequate nutrition will be maintained 03/28/2021 1322 by  Emmaline Life, RN Outcome: Adequate for Discharge 03/28/2021 1321 by Emmaline Life, RN Outcome: Adequate for Discharge   Problem: Coping: Goal: Level of anxiety will decrease 03/28/2021 1322 by Emmaline Life, RN Outcome: Adequate for Discharge 03/28/2021 1321 by Emmaline Life, RN Outcome: Adequate for Discharge   Problem: Elimination: Goal: Will not experience complications related to bowel motility 03/28/2021 1322 by Emmaline Life, RN Outcome: Adequate for Discharge 03/28/2021 1321 by Emmaline Life, RN Outcome: Adequate for Discharge Goal: Will not experience complications related to urinary retention 03/28/2021 1322 by Emmaline Life, RN Outcome: Adequate for Discharge 03/28/2021 1321 by Emmaline Life, RN Outcome: Adequate for Discharge   Problem: Pain Managment: Goal: General experience of comfort will improve 03/28/2021 1322 by Emmaline Life, RN Outcome: Adequate for Discharge 03/28/2021 1321 by Emmaline Life, RN Outcome: Adequate for Discharge   Problem: Safety: Goal: Ability to remain free from injury will improve 03/28/2021 1322 by Emmaline Life, RN Outcome: Adequate for Discharge 03/28/2021 1321 by Emmaline Life, RN Outcome: Adequate for Discharge   Problem: Skin Integrity: Goal: Risk for impaired skin integrity will decrease 03/28/2021 1322 by Emmaline Life, RN Outcome: Adequate for Discharge 03/28/2021 1321 by Emmaline Life, RN Outcome: Adequate for Discharge

## 2021-03-28 NOTE — Discharge Instructions (Addendum)
Continue to use your CPAP and Incentive Spirometry at home. Continue to stay hydrated at home and make sure you are having adequate light yellow urine.   Discharge Laparoscopic Surgery Instructions:  Common Complaints: Right shoulder pain is common after laparoscopic surgery. This is secondary to the gas used in the surgery being trapped under the diaphragm.  Walk to help your body absorb the gas. This will improve in a few days. Pain at the port sites are common, especially the larger port sites. This will improve with time.  Some nausea is common and poor appetite. The main goal is to stay hydrated the first few days after surgery.   Diet/ Activity: Diet as tolerated. You may not have an appetite, but it is important to stay hydrated. Drink 64 ounces of water a day. Your appetite will return with time.  Shower per your regular routine daily.  Do not take hot showers. Take warm showers that are less than 10 minutes. Rest and listen to your body, but do not remain in bed all day.  Walk everyday for at least 15-20 minutes. Deep cough and move around every 1-2 hours in the first few days after surgery.  Do not lift > 10 lbs, perform excessive bending, pushing, pulling, squatting for 1-2 weeks after surgery.  Do not pick at the dermabond glue on your incision sites.  This glue film will remain in place for 1-2 weeks and will start to peel off.  Do not place lotions or balms on your incision unless instructed to specifically by Dr. Constance Haw.   Pain Expectations and Narcotics: -After surgery you will have pain associated with your incisions and this is normal. The pain is muscular and nerve pain, and will get better with time. -You are encouraged and expected to take non narcotic medications like tylenol and ibuprofen (when able) to treat pain as multiple modalities can aid with pain treatment. -Narcotics are only used when pain is severe or there is breakthrough pain. -You are not expected to have  a pain score of 0 after surgery, as we cannot prevent pain. A pain score of 3-4 that allows you to be functional, move, walk, and tolerate some activity is the goal. The pain will continue to improve over the days after surgery and is dependent on your surgery. -Due to [place] law, we are only able to give a certain amount of pain medication to treat post operative pain, and we only give additional narcotics on a patient by patient basis.  -For most laparoscopic surgery, studies have shown that the majority of patients only need 10-15 narcotic pills, and for open surgeries most patients only need 15-20.   -Having appropriate expectations of pain and knowledge of pain management with non narcotics is important as we do not want anyone to become addicted to narcotic pain medication.  -Using ice packs in the first 48 hours and heating pads after 48 hours, wearing an abdominal binder (when recommended), and using over the counter medications are all ways to help with pain management.   -Simple acts like meditation and mindfulness practices after surgery can also help with pain control and research has proven the benefit of these practices.  Medication: Take tylenol and ibuprofen as needed for pain control, alternating every 4-6 hours.  Example:  Tylenol 1000mg  @ 6am, 12noon, 6pm, 46midnight (Do not exceed 4000mg  of tylenol a day). Ibuprofen 800mg  @ 9am, 3pm, 9pm, 3am (Do not exceed 3600mg  of ibuprofen a day).  Take Roxicodone for  breakthrough pain. You have a prescription for this already.  Take Colace for constipation related to narcotic pain medication. If you do not have a bowel movement in 2 days, take Miralax over the counter.  Drink plenty of water to also prevent constipation.   Contact Information: If you have questions or concerns, please call our office, [phone], Monday- Thursday 8AM-5PM and Friday 8AM-12Noon.  If it is after hours or on the weekend, please call [facility] Main Number,  [phone], .[phone], and ask to speak to the surgeon on call for Dr. Constance Haw at [facility].

## 2021-03-28 NOTE — Evaluation (Signed)
Physical Therapy Evaluation Patient Details Name: Mathew Lara MRN: [id number] DOB: February 15, 1961 Today's Date: 03/28/2021   History of Present Illness  Mathew Lara is a 60 y/o male s/p Laparoscopic cholecystectomy, wedge liver biopsy on 03/23/21, with the diagnosis of Gallbladder polyp, fatty liver.   Clinical Impression  Patient functioning near baseline for functional mobility and gait demonstrating fair/good return for bed mobility rolling to side and sitting up/lying down from side lying position without increased abdominal pain and good return for ambulation in room, hallways and going up/down ramp without loss of balance.  Plan:  Patient discharged from physical therapy to care of nursing for ambulation daily as tolerated for length of stay.      Follow Up Recommendations No PT follow up    Equipment Recommendations  None recommended by PT    Recommendations for Other Services       Precautions / Restrictions Precautions Precautions: None Restrictions Weight Bearing Restrictions: No      Mobility  Bed Mobility Overal bed mobility: Modified Independent                  Transfers Overall transfer level: Modified independent               General transfer comment: fair/good return for rolling to side and sitting up from side lying position with verbal cueing  Ambulation/Gait Ambulation/Gait assistance: Modified independent (Device/Increase time) Gait Distance (Feet): 120 Feet Assistive device: None Gait Pattern/deviations: WFL(Within Functional Limits) Gait velocity: slightly decreased   General Gait Details: grossly WFL with good return for ambulation in room, hallways and going up down ramp  Stairs            Wheelchair Mobility    Modified Rankin (Stroke Patients Only)       Balance Overall balance assessment: No apparent balance deficits (not formally assessed)                                            Pertinent Vitals/Pain Pain Assessment: 0-10 Pain Score: 6  Pain Location: abdomen at surgical site Pain Descriptors / Indicators: Sore;Discomfort Pain Intervention(s): Limited activity within patient's tolerance;Monitored during session    Home Living Family/patient expects to be discharged to:: Private residence Living Arrangements: Other relatives Available Help at Discharge: Family;Available 24 hours/day Type of Home: House Home Access: Level entry     Home Layout: One level Home Equipment: Cane - single point Additional Comments: Patient states he washes up in front of sink, does not take showers    Prior Function Level of Independence: Independent         Comments: Hydrographic surveyor, does not drive     Hand Dominance   Dominant Hand: Right    Extremity/Trunk Assessment   Upper Extremity Assessment Upper Extremity Assessment: Overall WFL for tasks assessed    Lower Extremity Assessment Lower Extremity Assessment: Overall WFL for tasks assessed    Cervical / Trunk Assessment Cervical / Trunk Assessment: Normal  Communication   Communication: No difficulties  Cognition Arousal/Alertness: Awake/alert Behavior During Therapy: WFL for tasks assessed/performed Overall Cognitive Status: Within Functional Limits for tasks assessed                                        General Comments  Exercises     Assessment/Plan    PT Assessment Patent does not need any further PT services  PT Problem List         PT Treatment Interventions      PT Goals (Current goals can be found in the Care Plan section)  Acute Rehab PT Goals Patient Stated Goal: return home with family to assist PT Goal Formulation: With patient Time For Goal Achievement: 03/28/21 Potential to Achieve Goals: Good    Frequency     Barriers to discharge        Co-evaluation               AM-PAC PT "6 Clicks" Mobility  Outcome Measure Help needed  turning from your back to your side while in a flat bed without using bedrails?: None Help needed moving from lying on your back to sitting on the side of a flat bed without using bedrails?: None Help needed moving to and from a bed to a chair (including a wheelchair)?: None Help needed standing up from a chair using your arms (e.g., wheelchair or bedside chair)?: None Help needed to walk in hospital room?: None Help needed climbing 3-5 steps with a railing? : A Little 6 Click Score: 23    End of Session   Activity Tolerance: Patient tolerated treatment well;Patient limited by fatigue Patient left: in chair Nurse Communication: Mobility status PT Visit Diagnosis: Unsteadiness on feet (R26.81);Other abnormalities of gait and mobility (R26.89);Muscle weakness (generalized) (M62.81)    Time: 3212-2482 PT Time Calculation (min) (ACUTE ONLY): 11 min   Charges:   PT Evaluation $PT Eval Low Complexity: 1 Low PT Treatments $Gait Training: 8-22 mins        12:21 PM, 03/28/21 Mathew Lara, MPT Physical Therapist with [facility] [phone] office [phone] mobile phone

## 2021-04-23 ENCOUNTER — Ambulatory Visit (INDEPENDENT_AMBULATORY_CARE_PROVIDER_SITE_OTHER): Payer: 59 | Admitting: General Surgery

## 2021-04-23 DIAGNOSIS — K824 Cholesterolosis of gallbladder: Secondary | ICD-10-CM

## 2021-04-23 NOTE — Progress Notes (Signed)
[facility]  I am calling the patient for post operative evaluation. This is not a billable encounter as it is under the [place] charges for the surgery.  The patient had a laparoscopic cholecystectomy and liver biopsy on 7/18. The patient did not answer and his voicemail was full. I called and left his daughter a message regarding how he was doing and for her or him to let the office know if there are any issues.   Pathology: FINAL MICROSCOPIC DIAGNOSIS:   A. GALLBLADDER, CHOLECYSTECTOMY:  - Chronic cholecystitis.  - Cholesterolosis.   B. 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  - Mild macrovesicular steatosis.  Will see the patient PRN.   Curlene Labrum, MD [facility] [address] [place], [place] [address] [phone] (office)

## 2021-06-17 ENCOUNTER — Ambulatory Visit (INDEPENDENT_AMBULATORY_CARE_PROVIDER_SITE_OTHER): Payer: 59 | Admitting: Gastroenterology

## 2021-06-17 ENCOUNTER — Other Ambulatory Visit: Payer: Self-pay

## 2021-06-17 ENCOUNTER — Encounter: Payer: Self-pay | Admitting: Gastroenterology

## 2021-06-17 VITALS — BP 128/75 | HR 100 | Temp 97.2°F | Ht 69.0 in | Wt 303.2 lb

## 2021-06-17 DIAGNOSIS — Z1211 Encounter for screening for malignant neoplasm of colon: Secondary | ICD-10-CM

## 2021-06-17 DIAGNOSIS — K760 Fatty (change of) liver, not elsewhere classified: Secondary | ICD-10-CM

## 2021-06-17 NOTE — Patient Instructions (Signed)
It was good to see you today!  Due to increased fibrosis (stiffness) of liver, I recommend an ultrasound of your liver yearly. We will do another one in June 2023.  We are arranging a routine colonoscopy in the future with Dr. Abbey Chatters.  Please review the attached handout regarding a fatty liver. It is possible for fibrosis to turn into cirrhosis if not taking care of the liver like we discussed. Keep up the good work with exercise and diet changes.   We will recheck your liver numbers in January 2023!  It was a pleasure to see you today. I want to create trusting relationships with patients to provide genuine, compassionate, and quality care. I value your feedback. If you receive a survey regarding your visit,  I greatly appreciate you taking time to fill this out.   Annitta Needs, PhD, ANP-BC [facility]

## 2021-06-17 NOTE — H&P (View-Only) (Signed)
Primary Care Physician:  [name], MD Primary Gastroenterologist:  Dr. Abbey Chatters     Chief Complaint  Patient presents with   Follow-up    HPI:   GANON DEMASI is a 60 y.o. male with a history of Hep C, genotype 1b. Treated with [name] in 2016. Achieved SVR. Immune to Hep A and B. Coarse liver seen on CT chest in May 2022 incidentally. US abdomen complete with fatty liver, negative mass. Suspected gallbladder polyp/mass seen at fundus. Referred to surgery. Underwent lab chole and liver biopsy on 03/23/21. Gallbladder path with chronic cholecystitis and cholesterolosis. Liver biopsy with chronic hepatitis minimal activity (Grade 1) and bridging fibrosis (stage 3), consistent with clinical history of Hep C. Mild macrovesicular steatosis. History also of food impaction s/p EGD in 2019.  Prior dilation in May 2015 of Schatzki ring. Incomplete visualization of cecum in 2015, overdue for colonoscopy. No adenomas.   He denies any abdominal pain. No N/V. No GERD. He does report solid food dysphagia. Currently not on a PPI. No pruritis or jaundice. No mental status changes or confusion. No constipation or diarrhea. No changes in bowel habits. No overt GI bleeding.  He is trying to eat a healthier diet. Joint pain has made exercise difficult.    Past Medical History:  Diagnosis Date   Alcohol abuse    Anxiety    Bronchitis    Chronic hip pain    CKD (chronic kidney disease) stage 3, GFR 30-59 ml/min (HCC)    COPD (chronic obstructive pulmonary disease) (HCC)    Essential hypertension    Hepatitis C    Mixed hyperlipidemia    Sleep apnea     Past Surgical History:  Procedure Laterality Date   CATARACT EXTRACTION Right    CHOLECYSTECTOMY N/A 03/23/2021   Procedure: LAPAROSCOPIC CHOLECYSTECTOMY;  Surgeon: [name], MD;  Location: [facility];  Service: General;  Laterality: N/A;   COLONOSCOPY N/A 01/23/2014   SLF:The LEFT COLON IS EXTREMELY redundant/TWO RECTAL POLYPS  REMOVED/SMALL VOLUME RECTAL BLEEDING MOST LIKELY DUE TO Small internal hemorrhoids. path with prolapse type polyp of benign-mucosa. no adenomas   ESOPHAGOGASTRODUODENOSCOPY N/A 01/23/2014   WNU:UVOZDGUY ring at the gastroesophageal junction/Medium sized hiatal hernia/NDYSPEPSIA MOST LIKELY DUE TO GERD/MILD Non-erosive gastritis   ESOPHAGOGASTRODUODENOSCOPY N/A 04/18/2018   food impaction, small hiatal hernia   HEMORRHOID BANDING  01/23/2014   Procedure: HEMORRHOID BANDING;  Surgeon: Danie Binder, MD;  Location: [facility] SUITE;  Service: Endoscopy;;   JOINT REPLACEMENT Right 2017   LIVER BIOPSY N/A 03/23/2021   Procedure: LAPAROSCOPIC LIVER BIOPSY;  Surgeon: [name], MD;  Location: [facility];  Service: General;  Laterality: N/A;   MOUTH SURGERY     TOTAL HIP ARTHROPLASTY Right 06/04/2016   Procedure: RIGHT TOTAL HIP ARTHROPLASTY ANTERIOR APPROACH;  Surgeon: Mcarthur Rossetti, MD;  Location: [facility];  Service: Orthopedics;  Laterality: Right;   TOTAL HIP ARTHROPLASTY Left 03/02/2019   Procedure: LEFT TOTAL HIP ARTHROPLASTY ANTERIOR APPROACH;  Surgeon: Mcarthur Rossetti, MD;  Location: [facility];  Service: Orthopedics;  Laterality: Left;    Current Outpatient Medications  Medication Sig Dispense Refill   amLODipine (NORVASC) 10 MG tablet Take 10 mg by mouth daily.     ascorbic acid (VITAMIN C) 500 MG tablet Take 1 tablet (500 mg total) by mouth daily. 30 tablet 1   aspirin EC 81 MG tablet Take 81 mg by mouth daily.     cholecalciferol (VITAMIN D) 25  MCG (1000 UNIT) tablet Take 1,000 Units by mouth daily.     fluticasone-salmeterol (ADVAIR) 250-50 MCG/ACT AEPB Inhale 1 puff into the lungs every 12 (twelve) hours. 60 each 11   hydrOXYzine (ATARAX/VISTARIL) 25 MG tablet Take 50 mg by mouth at bedtime.     loratadine (CLARITIN) 10 MG tablet Take 1 tablet (10 mg total) by mouth daily. 30 tablet 1   losartan (COZAAR) 50 MG tablet Take 50 mg by mouth daily.     oxyCODONE (OXY  IR/ROXICODONE) 5 MG immediate release tablet Take 5 mg by mouth every 8 (eight) hours as needed for moderate pain.     pantoprazole (PROTONIX) 40 MG tablet Take 1 tablet (40 mg total) by mouth daily. 30 minutes before breakfast 30 tablet 3   PROAIR HFA 108 (90 Base) MCG/ACT inhaler Inhale 2 puffs into the lungs every 4 (four) hours as needed for wheezing or shortness of breath. (Patient taking differently: Inhale 2 puffs into the lungs every 6 (six) hours as needed for wheezing or shortness of breath.) 18 g 3   sodium bicarbonate 650 MG tablet Take 650 mg by mouth 3 (three) times daily.     polyethylene glycol-electrolytes (NULYTELY) 420 g solution As directed 4000 mL 0   No current facility-administered medications for this visit.    Allergies as of 06/17/2021   (No Known Allergies)    Family History  Problem Relation Age of Onset   Cancer Mother    Colon cancer Neg Hx    Colon polyps Neg Hx     Social History   Socioeconomic History   Marital status: Single    Spouse name: Not on file   Number of children: 1   Years of education: 10   Highest education level: Not on file  Occupational History    Comment: [employer]  Tobacco Use   Smoking status: Former    Packs/day: 1.00    Years: 42.00    Pack years: 42.00    Types: Cigarettes    Start date: 09/07/1971    Quit date: 05/28/2014    Years since quitting: 7.0   Smokeless tobacco: Never   Tobacco comments:    quit in November 2015 after smoking x 20 yrs.  Vaping Use   Vaping Use: Never used  Substance and Sexual Activity   Alcohol use: Not Currently    Alcohol/week: 0.0 standard drinks   Drug use: No   Sexual activity: Not on file  Other Topics Concern   Not on file  Social History Narrative       patient consumes 4 cups of caffeine daily.      Patient stopped drinking years ago.   Social Determinants of Health   Financial Resource Strain: Not on file  Food Insecurity: Not on file  Transportation Needs: Not  on file  Physical Activity: Not on file  Stress: Not on file  Social Connections: Not on file  Intimate Partner Violence: Not on file    Review of Systems: Gen: Denies any fever, chills, fatigue, weight loss, lack of appetite.  CV: Denies chest pain, heart palpitations, peripheral edema, syncope.  Resp: Denies shortness of breath at rest or with exertion. Denies wheezing or cough.  GI: see HPI GU : Denies urinary burning, urinary frequency, urinary hesitancy MS: Denies joint pain, muscle weakness, cramps, or limitation of movement.  Derm: Denies rash, itching, dry skin Psych: Denies depression, anxiety, memory loss, and confusion Heme: Denies bruising, bleeding, and enlarged lymph nodes.  Physical Exam: BP 128/75   Pulse 100   Temp (!) 97.2 F (36.2 C) (Temporal)   Ht 5\' 9"  (1.753 m)   Wt (!) 303 lb 3.2 oz (137.5 kg)   BMI 44.77 kg/m  General:   Alert and oriented. Pleasant and cooperative. Well-nourished and well-developed.  Head:  Normocephalic and atraumatic. Eyes:  Without icterus, sclera clear and conjunctiva pink.  Ears:  Normal auditory acuity. Mouth:  mask in place Lungs:  Clear to auscultation bilaterally.  Heart:  S1, S2 present without murmurs appreciated.  Abdomen:  +BS, soft, non-tender and non-distended. No HSM noted. No guarding or rebound. No masses appreciated.  Rectal:  Deferred  Msk:  Symmetrical without gross deformities. Normal posture. Extremities:  Without edema. Neurologic:  Alert and  oriented x4;  grossly normal neurologically. Skin:  Intact without significant lesions or rashes. Psych:  Alert and cooperative. Normal mood and affect.  Lab Results  Component Value Date   ALT 44 03/26/2021   AST 29 03/26/2021   ALKPHOS 99 03/26/2021   BILITOT 1.0 03/26/2021     ASSESSMENT: DONNELLE OLMEDA is a 60 y.o. male presenting today with a history of Hep C, genotype 1b, s/p treatment with [name] in 2016 and achieving SVR, with coarse liver seen on  CT chest in May 2022 incidentally.   US abdomen then undertaken with fatty liver and negative mass. Suspected gallbladder polyp/mass seen at fundus, and he underwent lap chole and liver biopsy on 03/23/21. Path with chronic hepatitis minimal activity (Grade 1) and bridging fibrosis (stage 3), consistent with clinical history of Hep C. Mild macrovesicular steatosis. Spleen upper normal size. No thrombocytopenia. We discussed following yearly due to advanced fibrosis and risk of progression to cirrhosis. Discussed extensively fatty liver diet, exercise, management of comorbidities. He is immune to Hep A and B.  Solid food dysphagia: history of Schatzki ring s/p dilation in 2015 and food impaction in 2019 without subsequent follow-up. Will arrange for EGD/dilation in near future.  Last colonoscopy in 2015 with incomplete visualization of cecum. No adenomas. Due for colonoscopy as well now. No concerning lower GI signs/symptoms. No FH of colorectal cancer or polyps.   PLAN: Start Protonix 40 mg once daily  Proceed with colonoscopy/EGD/dilation by Dr. Abbey Chatters  in near future: the risks, benefits, and alternatives have been discussed with the patient in detail. The patient states understanding and desires to proceed.   Fatty liver care discussed including diet/behavior modification  Recheck HFP in Jan 2023, [place] in June 2023  Not labeling as cirrhosis at this point but does have advanced fibrosis with risk of advancement to cirrhosis in setting of Hep C s/p treatment and fatty liver. Will set up yearly [place]. May need every 6 months if evidence of portal hypertensive changes on EGD.   Annitta Needs, PhD, ANP-BC [facility]

## 2021-06-17 NOTE — Progress Notes (Signed)
Primary Care Physician:  [name], MD Primary Gastroenterologist:  Dr. Abbey Chatters     Chief Complaint  Patient presents with   Follow-up    HPI:   Mathew Lara is a 60 y.o. male with a history of Hep C, genotype 1b. Treated with [name] in 2016. Achieved SVR. Immune to Hep A and B. Coarse liver seen on CT chest in May 2022 incidentally. US abdomen complete with fatty liver, negative mass. Suspected gallbladder polyp/mass seen at fundus. Referred to surgery. Underwent lab chole and liver biopsy on 03/23/21. Gallbladder path with chronic cholecystitis and cholesterolosis. Liver biopsy with chronic hepatitis minimal activity (Grade 1) and bridging fibrosis (stage 3), consistent with clinical history of Hep C. Mild macrovesicular steatosis. History also of food impaction s/p EGD in 2019.  Prior dilation in May 2015 of Schatzki ring. Incomplete visualization of cecum in 2015, overdue for colonoscopy. No adenomas.   He denies any abdominal pain. No N/V. No GERD. He does report solid food dysphagia. Currently not on a PPI. No pruritis or jaundice. No mental status changes or confusion. No constipation or diarrhea. No changes in bowel habits. No overt GI bleeding.  He is trying to eat a healthier diet. Joint pain has made exercise difficult.    Past Medical History:  Diagnosis Date   Alcohol abuse    Anxiety    Bronchitis    Chronic hip pain    CKD (chronic kidney disease) stage 3, GFR 30-59 ml/min (HCC)    COPD (chronic obstructive pulmonary disease) (HCC)    Essential hypertension    Hepatitis C    Mixed hyperlipidemia    Sleep apnea     Past Surgical History:  Procedure Laterality Date   CATARACT EXTRACTION Right    CHOLECYSTECTOMY N/A 03/23/2021   Procedure: LAPAROSCOPIC CHOLECYSTECTOMY;  Surgeon: [name], MD;  Location: [facility];  Service: General;  Laterality: N/A;   COLONOSCOPY N/A 01/23/2014   SLF:The LEFT COLON IS EXTREMELY redundant/TWO RECTAL POLYPS  REMOVED/SMALL VOLUME RECTAL BLEEDING MOST LIKELY DUE TO Small internal hemorrhoids. path with prolapse type polyp of benign-mucosa. no adenomas   ESOPHAGOGASTRODUODENOSCOPY N/A 01/23/2014   DJM:EQASTMHD ring at the gastroesophageal junction/Medium sized hiatal hernia/NDYSPEPSIA MOST LIKELY DUE TO GERD/MILD Non-erosive gastritis   ESOPHAGOGASTRODUODENOSCOPY N/A 04/18/2018   food impaction, small hiatal hernia   HEMORRHOID BANDING  01/23/2014   Procedure: HEMORRHOID BANDING;  Surgeon: Danie Binder, MD;  Location: [facility];  Service: Endoscopy;;   JOINT REPLACEMENT Right 2017   LIVER BIOPSY N/A 03/23/2021   Procedure: LAPAROSCOPIC LIVER BIOPSY;  Surgeon: [name], MD;  Location: [facility];  Service: General;  Laterality: N/A;   MOUTH SURGERY     TOTAL HIP ARTHROPLASTY Right 06/04/2016   Procedure: RIGHT TOTAL HIP ARTHROPLASTY ANTERIOR APPROACH;  Surgeon: Mcarthur Rossetti, MD;  Location: [facility];  Service: Orthopedics;  Laterality: Right;   TOTAL HIP ARTHROPLASTY Left 03/02/2019   Procedure: LEFT TOTAL HIP ARTHROPLASTY ANTERIOR APPROACH;  Surgeon: Mcarthur Rossetti, MD;  Location: [facility];  Service: Orthopedics;  Laterality: Left;    Current Outpatient Medications  Medication Sig Dispense Refill   amLODipine (NORVASC) 10 MG tablet Take 10 mg by mouth daily.     ascorbic acid (VITAMIN C) 500 MG tablet Take 1 tablet (500 mg total) by mouth daily. 30 tablet 1   aspirin EC 81 MG tablet Take 81 mg by mouth daily.     cholecalciferol (VITAMIN D) 25  MCG (1000 UNIT) tablet Take 1,000 Units by mouth daily.     fluticasone-salmeterol (ADVAIR) 250-50 MCG/ACT AEPB Inhale 1 puff into the lungs every 12 (twelve) hours. 60 each 11   hydrOXYzine (ATARAX/VISTARIL) 25 MG tablet Take 50 mg by mouth at bedtime.     loratadine (CLARITIN) 10 MG tablet Take 1 tablet (10 mg total) by mouth daily. 30 tablet 1   losartan (COZAAR) 50 MG tablet Take 50 mg by mouth daily.     oxyCODONE (OXY  IR/ROXICODONE) 5 MG immediate release tablet Take 5 mg by mouth every 8 (eight) hours as needed for moderate pain.     pantoprazole (PROTONIX) 40 MG tablet Take 1 tablet (40 mg total) by mouth daily. 30 minutes before breakfast 30 tablet 3   PROAIR HFA 108 (90 Base) MCG/ACT inhaler Inhale 2 puffs into the lungs every 4 (four) hours as needed for wheezing or shortness of breath. (Patient taking differently: Inhale 2 puffs into the lungs every 6 (six) hours as needed for wheezing or shortness of breath.) 18 g 3   sodium bicarbonate 650 MG tablet Take 650 mg by mouth 3 (three) times daily.     polyethylene glycol-electrolytes (NULYTELY) 420 g solution As directed 4000 mL 0   No current facility-administered medications for this visit.    Allergies as of 06/17/2021   (No Known Allergies)    Family History  Problem Relation Age of Onset   Cancer Mother    Colon cancer Neg Hx    Colon polyps Neg Hx     Social History   Socioeconomic History   Marital status: Single    Spouse name: Not on file   Number of children: 1   Years of education: 10   Highest education level: Not on file  Occupational History    Comment: [employer]  Tobacco Use   Smoking status: Former    Packs/day: 1.00    Years: 42.00    Pack years: 42.00    Types: Cigarettes    Start date: 09/07/1971    Quit date: 05/28/2014    Years since quitting: 7.0   Smokeless tobacco: Never   Tobacco comments:    quit in November 2015 after smoking x 20 yrs.  Vaping Use   Vaping Use: Never used  Substance and Sexual Activity   Alcohol use: Not Currently    Alcohol/week: 0.0 standard drinks   Drug use: No   Sexual activity: Not on file  Other Topics Concern   Not on file  Social History Narrative       patient consumes 4 cups of caffeine daily.      Patient stopped drinking years ago.   Social Determinants of Health   Financial Resource Strain: Not on file  Food Insecurity: Not on file  Transportation Needs: Not  on file  Physical Activity: Not on file  Stress: Not on file  Social Connections: Not on file  Intimate Partner Violence: Not on file    Review of Systems: Gen: Denies any fever, chills, fatigue, weight loss, lack of appetite.  CV: Denies chest pain, heart palpitations, peripheral edema, syncope.  Resp: Denies shortness of breath at rest or with exertion. Denies wheezing or cough.  GI: see HPI GU : Denies urinary burning, urinary frequency, urinary hesitancy MS: Denies joint pain, muscle weakness, cramps, or limitation of movement.  Derm: Denies rash, itching, dry skin Psych: Denies depression, anxiety, memory loss, and confusion Heme: Denies bruising, bleeding, and enlarged lymph nodes.  Physical Exam: BP 128/75   Pulse 100   Temp (!) 97.2 F (36.2 C) (Temporal)   Ht 5\' 9"  (1.753 m)   Wt (!) 303 lb 3.2 oz (137.5 kg)   BMI 44.77 kg/m  General:   Alert and oriented. Pleasant and cooperative. Well-nourished and well-developed.  Head:  Normocephalic and atraumatic. Eyes:  Without icterus, sclera clear and conjunctiva pink.  Ears:  Normal auditory acuity. Mouth:  mask in place Lungs:  Clear to auscultation bilaterally.  Heart:  S1, S2 present without murmurs appreciated.  Abdomen:  +BS, soft, non-tender and non-distended. No HSM noted. No guarding or rebound. No masses appreciated.  Rectal:  Deferred  Msk:  Symmetrical without gross deformities. Normal posture. Extremities:  Without edema. Neurologic:  Alert and  oriented x4;  grossly normal neurologically. Skin:  Intact without significant lesions or rashes. Psych:  Alert and cooperative. Normal mood and affect.  Lab Results  Component Value Date   ALT 44 03/26/2021   AST 29 03/26/2021   ALKPHOS 99 03/26/2021   BILITOT 1.0 03/26/2021     ASSESSMENT: WILDON Lara is a 60 y.o. male presenting today with a history of Hep C, genotype 1b, s/p treatment with [name] in 2016 and achieving SVR, with coarse liver seen on  CT chest in May 2022 incidentally.   US abdomen then undertaken with fatty liver and negative mass. Suspected gallbladder polyp/mass seen at fundus, and he underwent lap chole and liver biopsy on 03/23/21. Path with chronic hepatitis minimal activity (Grade 1) and bridging fibrosis (stage 3), consistent with clinical history of Hep C. Mild macrovesicular steatosis. Spleen upper normal size. No thrombocytopenia. We discussed following yearly due to advanced fibrosis and risk of progression to cirrhosis. Discussed extensively fatty liver diet, exercise, management of comorbidities. He is immune to Hep A and B.  Solid food dysphagia: history of Schatzki ring s/p dilation in 2015 and food impaction in 2019 without subsequent follow-up. Will arrange for EGD/dilation in near future.  Last colonoscopy in 2015 with incomplete visualization of cecum. No adenomas. Due for colonoscopy as well now. No concerning lower GI signs/symptoms. No FH of colorectal cancer or polyps.   PLAN: Start Protonix 40 mg once daily  Proceed with colonoscopy/EGD/dilation by Dr. Abbey Chatters  in near future: the risks, benefits, and alternatives have been discussed with the patient in detail. The patient states understanding and desires to proceed.   Fatty liver care discussed including diet/behavior modification  Recheck HFP in Jan 2023, [place] in June 2023  Not labeling as cirrhosis at this point but does have advanced fibrosis with risk of advancement to cirrhosis in setting of Hep C s/p treatment and fatty liver. Will set up yearly [place]. May need every 6 months if evidence of portal hypertensive changes on EGD.   Annitta Needs, PhD, ANP-BC [facility]

## 2021-06-18 ENCOUNTER — Telehealth: Payer: Self-pay | Admitting: *Deleted

## 2021-06-18 ENCOUNTER — Telehealth: Payer: Self-pay | Admitting: Internal Medicine

## 2021-06-18 MED ORDER — PEG 3350-KCL-NA BICARB-NACL 420 G PO SOLR: ORAL | 0 refills | Status: DC

## 2021-06-18 NOTE — Telephone Encounter (Signed)
Fowarding to Vicente Males as she saw patient yesterday

## 2021-06-18 NOTE — Telephone Encounter (Signed)
Yes, we can do EGD with dilation at time of colonoscopy.

## 2021-06-18 NOTE — Telephone Encounter (Signed)
Pt called and is aware of his procedure date and that the instructions have been mailed. He wants to have an EGD also. He said that food is getting stuck in his throat. Can this be added or does he need OV prior to scheduling EGD. He wants both done on the same day. [phone]

## 2021-06-18 NOTE — Telephone Encounter (Signed)
Called pt. He has been scheduled for TCS with Dr. Abbey Chatters, asa 3 on 10/31 at 11:45am. Aware will mail prep instructions and pre-op appt. Advised will send Rx to pharmacy. Confirmed address. Confirmed pharmacy

## 2021-06-19 NOTE — Telephone Encounter (Signed)
Called pt and made aware. Message sent to endo to add to orders also. Consent also updated

## 2021-06-24 ENCOUNTER — Encounter: Payer: Self-pay | Admitting: Gastroenterology

## 2021-06-24 MED ORDER — PANTOPRAZOLE SODIUM 40 MG PO TBEC: 40.0000 mg | DELAYED_RELEASE_TABLET | Freq: Every day | ORAL | 3 refills | Status: DC

## 2021-06-25 ENCOUNTER — Telehealth: Payer: Self-pay | Admitting: Gastroenterology

## 2021-06-25 NOTE — Telephone Encounter (Signed)
Phoned and advised the pt of  the EGD/dilation being added to colonoscopy and regarding protonix being sent in to his Pharmacy along with the instructions of use. Pt expressed understanding

## 2021-06-25 NOTE — Telephone Encounter (Signed)
Dena:  We have added EGD/dilation to his upcoming colonoscopy due to dysphagia. Since he is having dysphagia, I'm recommending starting Protonix once daily. I sent it in to his pharmacy. Take once each morning, 30 minutes before breakfast. Thanks!

## 2021-07-01 ENCOUNTER — Telehealth: Payer: Self-pay

## 2021-07-01 NOTE — Telephone Encounter (Signed)
Called patient to ask if he could bring his SD card from his cpap machine to tomorrows appt. Patient said he doesn't have an SD card in his machine. Nothing further needed.

## 2021-07-01 NOTE — Patient Instructions (Signed)
Mathew Lara  07/01/2021     @PREFPERIOPPHARMACY @   Your procedure is scheduled on  07/06/2021.    Report to [facility] at  1000 A.M.   Call this number if you have problems the morning of surgery:  [phone]   Remember:  Follow the diet and prep instructions given to you by the office.       Use your inhalers before you come and bring your rescue inhaler with you.    Take these medicines the morning of surgery with A SIP OF WATER     amlodipine, cymbalta, gabapentin, oxycodone (if needed), protonix.    Do not wear jewelry, make-up or nail polish.  Do not wear lotions, powders, or perfumes, or deodorant.  Do not shave 48 hours prior to surgery.  Men may shave face and neck.  Do not bring valuables to the hospital.  [facility] is not responsible for any belongings or valuables.  Contacts, dentures or bridgework may not be worn into surgery.  Leave your suitcase in the car.  After surgery it may be brought to your room.  For patients admitted to the hospital, discharge time will be determined by your treatment team.  Patients discharged the day of surgery will not be allowed to drive home and must have someone with them for 24 hours.    Special instructions:   DO NOT smoke tobacco or vape for 24 hours before your procedure.  Please read over the following fact sheets that you were given. Anesthesia Post-op Instructions and Care and Recovery After Surgery      Upper Endoscopy, Adult, Care After This sheet gives you information about how to care for yourself after your procedure. Your health care provider may also give you more specific instructions. If you have problems or questions, contact your health care provider. What can I expect after the procedure? After the procedure, it is common to have: A sore throat. Mild stomach pain or discomfort. Bloating. Nausea. Follow these instructions at home:  Follow instructions from your health care  provider about what to eat or drink after your procedure. Return to your normal activities as told by your health care provider. Ask your health care provider what activities are safe for you. Take over-the-counter and prescription medicines only as told by your health care provider. If you were given a sedative during the procedure, it can affect you for several hours. Do not drive or operate machinery until your health care provider says that it is safe. Keep all follow-up visits as told by your health care provider. This is important. Contact a health care provider if you have: A sore throat that lasts longer than one day. Trouble swallowing. Get help right away if: You vomit blood or your vomit looks like coffee grounds. You have: A fever. Bloody, black, or tarry stools. A severe sore throat or you cannot swallow. Difficulty breathing. Severe pain in your chest or abdomen. Summary After the procedure, it is common to have a sore throat, mild stomach discomfort, bloating, and nausea. If you were given a sedative during the procedure, it can affect you for several hours. Do not drive or operate machinery until your health care provider says that it is safe. Follow instructions from your health care provider about what to eat or drink after your procedure. Return to your normal activities as told by your health care provider. This information is not intended to replace advice  given to you by your health care provider. Make sure you discuss any questions you have with your health care provider. Document Revised: 08/21/2019 Document Reviewed: 01/23/2018 Elsevier Patient Education  2022 [place]. Esophageal Dilatation Esophageal dilatation, also called esophageal dilation, is a procedure to widen or open a blocked or narrowed part of the esophagus. The esophagus is the part of the body that moves food and liquid from the mouth to the stomach. You may need this procedure if: You have a  buildup of scar tissue in your esophagus that makes it difficult, painful, or impossible to swallow. This can be caused by gastroesophageal reflux disease (GERD). You have cancer of the esophagus. There is a problem with how food moves through your esophagus. In some cases, you may need this procedure repeated at a later time to dilate the esophagus gradually. Tell a health care provider about: Any allergies you have. All medicines you are taking, including vitamins, herbs, eye drops, creams, and over-the-counter medicines. Any problems you or family members have had with anesthetic medicines. Any blood disorders you have. Any surgeries you have had. Any medical conditions you have. Any antibiotic medicines you are required to take before dental procedures. Whether you are pregnant or may be pregnant. What are the risks? Generally, this is a safe procedure. However, problems may occur, including: Bleeding due to a tear in the lining of the esophagus. A hole, or perforation, in the esophagus. What happens before the procedure? Ask your health care provider about: Changing or stopping your regular medicines. This is especially important if you are taking diabetes medicines or blood thinners. Taking medicines such as aspirin and ibuprofen. These medicines can thin your blood. Do not take these medicines unless your health care provider tells you to take them. Taking over-the-counter medicines, vitamins, herbs, and supplements. Follow instructions from your health care provider about eating or drinking restrictions. Plan to have a responsible adult take you home from the hospital or clinic. Plan to have a responsible adult care for you for the time you are told after you leave the hospital or clinic. This is important. What happens during the procedure? You may be given a medicine to help you relax (sedative). A numbing medicine may be sprayed into the back of your throat, or you may gargle  the medicine. Your health care provider may perform the dilatation using various surgical instruments, such as: Simple dilators. This instrument is carefully placed in the esophagus to stretch it. Guided wire bougies. This involves using an endoscope to insert a wire into the esophagus. A dilator is passed over this wire to enlarge the esophagus. Then the wire is removed. Balloon dilators. An endoscope with a small balloon is inserted into the esophagus. The balloon is inflated to stretch the esophagus and open it up. The procedure may vary among health care providers and hospitals. What can I expect after the procedure? Your blood pressure, heart rate, breathing rate, and blood oxygen level will be monitored until you leave the hospital or clinic. Your throat may feel slightly sore and numb. This will get better over time. You will not be allowed to eat or drink until your throat is no longer numb. When you are able to drink, urinate, and sit on the edge of the bed without nausea or dizziness, you may be able to return home. Follow these instructions at home: Take over-the-counter and prescription medicines only as told by your health care provider. If you were given a sedative  during the procedure, it can affect you for several hours. Do not drive or operate machinery until your health care provider says that it is safe. Plan to have a responsible adult care for you for the time you are told. This is important. Follow instructions from your health care provider about any eating or drinking restrictions. Do not use any products that contain nicotine or tobacco, such as cigarettes, e-cigarettes, and chewing tobacco. If you need help quitting, ask your health care provider. Keep all follow-up visits. This is important. Contact a health care provider if: You have a fever. You have pain that is not relieved by medicine. Get help right away if: You have chest pain. You have trouble breathing. You  have trouble swallowing. You vomit blood. You have black, tarry, or bloody stools. These symptoms may represent a serious problem that is an emergency. Do not wait to see if the symptoms will go away. Get medical help right away. Call your local emergency services (911 in the [place] Do not drive yourself to the hospital. Summary Esophageal dilatation, also called esophageal dilation, is a procedure to widen or open a blocked or narrowed part of the esophagus. Plan to have a responsible adult take you home from the hospital or clinic. For this procedure, a numbing medicine may be sprayed into the back of your throat, or you may gargle the medicine. Do not drive or operate machinery until your health care provider says that it is safe. This information is not intended to replace advice given to you by your health care provider. Make sure you discuss any questions you have with your health care provider. Document Revised: 01/09/2020 Document Reviewed: 01/09/2020 Elsevier Patient Education  [place]. Colonoscopy, Adult, Care After This sheet gives you information about how to care for yourself after your procedure. Your health care provider may also give you more specific instructions. If you have problems or questions, contact your health care provider. What can I expect after the procedure? After the procedure, it is common to have: A small amount of blood in your stool for 24 hours after the procedure. Some gas. Mild cramping or bloating of your abdomen. Follow these instructions at home: Eating and drinking  Drink enough fluid to keep your urine pale yellow. Follow instructions from your health care provider about eating or drinking restrictions. Resume your normal diet as instructed by your health care provider. Avoid heavy or fried foods that are hard to digest. Activity Rest as told by your health care provider. Avoid sitting for a long time without moving. Get up to take  short walks every 1-2 hours. This is important to improve blood flow and breathing. Ask for help if you feel weak or unsteady. Return to your normal activities as told by your health care provider. Ask your health care provider what activities are safe for you. Managing cramping and bloating  Try walking around when you have cramps or feel bloated. Apply heat to your abdomen as told by your health care provider. Use the heat source that your health care provider recommends, such as a moist heat pack or a heating pad. Place a towel between your skin and the heat source. Leave the heat on for 20-30 minutes. Remove the heat if your skin turns bright red. This is especially important if you are unable to feel pain, heat, or cold. You may have a greater risk of getting burned. General instructions If you were given a sedative during the procedure,  it can affect you for several hours. Do not drive or operate machinery until your health care provider says that it is safe. For the first 24 hours after the procedure: Do not sign important documents. Do not drink alcohol. Do your regular daily activities at a slower pace than normal. Eat soft foods that are easy to digest. Take over-the-counter and prescription medicines only as told by your health care provider. Keep all follow-up visits as told by your health care provider. This is important. Contact a health care provider if: You have blood in your stool 2-3 days after the procedure. Get help right away if you have: More than a small spotting of blood in your stool. Large blood clots in your stool. Swelling of your abdomen. Nausea or vomiting. A fever. Increasing pain in your abdomen that is not relieved with medicine. Summary After the procedure, it is common to have a small amount of blood in your stool. You may also have mild cramping and bloating of your abdomen. If you were given a sedative during the procedure, it can affect you for  several hours. Do not drive or operate machinery until your health care provider says that it is safe. Get help right away if you have a lot of blood in your stool, nausea or vomiting, a fever, or increased pain in your abdomen. This information is not intended to replace advice given to you by your health care provider. Make sure you discuss any questions you have with your health care provider. Document Revised: 08/17/2019 Document Reviewed: 03/19/2019 Elsevier Patient Education  [place] After This sheet gives you information about how to care for yourself after your procedure. Your health care provider may also give you more specific instructions. If you have problems or questions, contact your health care provider. What can I expect after the procedure? After the procedure, it is common to have: Tiredness. Forgetfulness about what happened after the procedure. Impaired judgment for important decisions. Nausea or vomiting. Some difficulty with balance. Follow these instructions at home: For the time period you were told by your health care provider:   Rest as needed. Do not participate in activities where you could fall or become injured. Do not drive or use machinery. Do not drink alcohol. Do not take sleeping pills or medicines that cause drowsiness. Do not make important decisions or sign legal documents. Do not take care of children on your own. Eating and drinking Follow the diet that is recommended by your health care provider. Drink enough fluid to keep your urine pale yellow. If you vomit: Drink water, juice, or soup when you can drink without vomiting. Make sure you have little or no nausea before eating solid foods. General instructions Have a responsible adult stay with you for the time you are told. It is important to have someone help care for you until you are awake and alert. Take over-the-counter and prescription medicines  only as told by your health care provider. If you have sleep apnea, surgery and certain medicines can increase your risk for breathing problems. Follow instructions from your health care provider about wearing your sleep device: Anytime you are sleeping, including during daytime naps. While taking prescription pain medicines, sleeping medicines, or medicines that make you drowsy. Avoid smoking. Keep all follow-up visits as told by your health care provider. This is important. Contact a health care provider if: You keep feeling nauseous or you keep vomiting. You feel light-headed. You are  still sleepy or having trouble with balance after 24 hours. You develop a rash. You have a fever. You have redness or swelling around the IV site. Get help right away if: You have trouble breathing. You have new-onset confusion at home. Summary For several hours after your procedure, you may feel tired. You may also be forgetful and have poor judgment. Have a responsible adult stay with you for the time you are told. It is important to have someone help care for you until you are awake and alert. Rest as told. Do not drive or operate machinery. Do not drink alcohol or take sleeping pills. Get help right away if you have trouble breathing, or if you suddenly become confused. This information is not intended to replace advice given to you by your health care provider. Make sure you discuss any questions you have with your health care provider. Document Revised: 05/08/2020 Document Reviewed: 07/26/2019 Elsevier Patient Education  2022 [employer].

## 2021-07-02 ENCOUNTER — Ambulatory Visit (INDEPENDENT_AMBULATORY_CARE_PROVIDER_SITE_OTHER): Payer: 59 | Admitting: Pulmonary Disease

## 2021-07-02 ENCOUNTER — Other Ambulatory Visit: Payer: Self-pay

## 2021-07-02 ENCOUNTER — Encounter (HOSPITAL_COMMUNITY)
Admission: RE | Admit: 2021-07-02 | Discharge: 2021-07-02 | Disposition: A | Discharge status: Home / Self Care | Payer: 59 | Source: Ambulatory Visit | Attending: Internal Medicine | Admitting: Internal Medicine

## 2021-07-02 ENCOUNTER — Encounter: Payer: Self-pay | Admitting: Pulmonary Disease

## 2021-07-02 VITALS — BP 130/84 | HR 105 | Temp 98.4°F | Ht 68.0 in | Wt 307.0 lb

## 2021-07-02 VITALS — BP 128/86 | HR 106 | Temp 98.5°F | Resp 18 | Ht 68.0 in | Wt 307.0 lb

## 2021-07-02 DIAGNOSIS — Z01812 Encounter for preprocedural laboratory examination: Secondary | ICD-10-CM | POA: Insufficient documentation

## 2021-07-02 DIAGNOSIS — J849 Interstitial pulmonary disease, unspecified: Secondary | ICD-10-CM | POA: Diagnosis not present

## 2021-07-02 DIAGNOSIS — G4733 Obstructive sleep apnea (adult) (pediatric): Secondary | ICD-10-CM | POA: Diagnosis not present

## 2021-07-02 DIAGNOSIS — B1921 Unspecified viral hepatitis C with hepatic coma: Secondary | ICD-10-CM | POA: Diagnosis not present

## 2021-07-02 DIAGNOSIS — Z9989 Dependence on other enabling machines and devices: Secondary | ICD-10-CM

## 2021-07-02 DIAGNOSIS — Z8616 Personal history of COVID-19: Secondary | ICD-10-CM | POA: Diagnosis not present

## 2021-07-02 DIAGNOSIS — J449 Chronic obstructive pulmonary disease, unspecified: Secondary | ICD-10-CM

## 2021-07-02 LAB — CBC WITH DIFFERENTIAL/PLATELET
Abs Immature Granulocytes: 0.11 10*3/uL — ABNORMAL HIGH (ref 0.00–0.07)
Basophils Absolute: 0.1 10*3/uL (ref 0.0–0.1)
Basophils Relative: 1 %
Eosinophils Absolute: 0.5 10*3/uL (ref 0.0–0.5)
Eosinophils Relative: 5 %
HCT: 42 % (ref 39.0–52.0)
Hemoglobin: 13.5 g/dL (ref 13.0–17.0)
Immature Granulocytes: 1 %
Lymphocytes Relative: 14 %
Lymphs Abs: 1.3 10*3/uL (ref 0.7–4.0)
MCH: 28.3 pg (ref 26.0–34.0)
MCHC: 32.1 g/dL (ref 30.0–36.0)
MCV: 88.1 fL (ref 80.0–100.0)
Monocytes Absolute: 0.9 10*3/uL (ref 0.1–1.0)
Monocytes Relative: 10 %
Neutro Abs: 6.6 10*3/uL (ref 1.7–7.7)
Neutrophils Relative %: 69 %
Platelets: 259 10*3/uL (ref 150–400)
RBC: 4.77 MIL/uL (ref 4.22–5.81)
RDW: 15.3 % (ref 11.5–15.5)
WBC: 9.5 10*3/uL (ref 4.0–10.5)
nRBC: 0 % (ref 0.0–0.2)

## 2021-07-02 LAB — COMPREHENSIVE METABOLIC PANEL
ALT: 18 U/L (ref 0–44)
AST: 18 U/L (ref 15–41)
Albumin: 4.1 g/dL (ref 3.5–5.0)
Alkaline Phosphatase: 125 U/L (ref 38–126)
Anion gap: 10 (ref 5–15)
BUN: 23 mg/dL (ref 8–23)
CO2: 22 mmol/L (ref 22–32)
Calcium: 9.4 mg/dL (ref 8.9–10.3)
Chloride: 103 mmol/L (ref 98–111)
Creatinine, Ser: 2.21 mg/dL — ABNORMAL HIGH (ref 0.61–1.24)
GFR, Estimated: 33 mL/min — ABNORMAL LOW (ref >60)
Glucose, Bld: 158 mg/dL — ABNORMAL HIGH (ref 70–99)
Potassium: 4 mmol/L (ref 3.5–5.1)
Sodium: 135 mmol/L (ref 135–145)
Total Bilirubin: 0.6 mg/dL (ref 0.3–1.2)
Total Protein: 8.8 g/dL — ABNORMAL HIGH (ref 6.5–8.1)

## 2021-07-02 LAB — PROTIME-INR
INR: 1 (ref 0.8–1.2)
Prothrombin Time: 12.7 seconds (ref 11.4–15.2)

## 2021-07-02 NOTE — Progress Notes (Signed)
[facility]  Chief Complaint  Patient presents with   Follow-up    No SOB or cough to report.  Feels improvement since last OV.  Patient was asked to bring in [place] card but states his CPAP machine does not have one.     Constitutional:  BP 130/84 (BP Location: Left Arm, Patient Position: Sitting)   Pulse (!) 105   Temp 98.4 F (36.9 C)   Ht 5\' 8"  (1.727 m)   Wt (!) 307 lb (139.3 kg)   SpO2 94%   BMI 46.68 kg/m   Past Medical History:  HTN, Hep C, CKD 3b, Chronic pain, GERD, OSA, Anemia, Secondary hyperparathyroidism, Vit D deficiency  Past Surgical History:  He  has a past surgical history that includes Colonoscopy (N/A, 01/23/2014); Esophagogastroduodenoscopy (N/A, 01/23/2014); Hemorrhoid banding (01/23/2014); Mouth surgery; Total hip arthroplasty (Right, 06/04/2016); Esophagogastroduodenoscopy (N/A, 04/18/2018); Joint replacement (Right, 2017); Total hip arthroplasty (Left, 03/02/2019); Cataract extraction (Right); Cholecystectomy (N/A, 03/23/2021); and Liver biopsy (N/A, 03/23/2021).  Brief Summary:  Mathew Lara is a 60 y.o. male former smoker with COPD, and COVID 23 infection in December 0981 complicated by fibrosis.      Subjective:   He has been feeling better.  No having cough, wheeze, or sputum.  Keeping up with exercise.  Advair helps.  Hasn't needed to use albuterol.  Uses Bipap nightly w/o issues.    Physical Exam:   Appearance - well kempt   ENMT - no sinus tenderness, no oral exudate, no LAN, Mallampati 4 airway, no stridor  Respiratory - equal breath sounds bilaterally, no wheezing or rales  CV - s1s2 regular rate and rhythm, no murmurs  Ext - no clubbing, no edema  Skin - no rashes  Psych - normal mood and affect    Pulmonary testing:  PFT 02/12/20 >> FEV1 2.37 (81%), FEV1% 76, TLC 4.98 (75%), DLCO 38%  Chest Imaging:  HRCT chest 01/31/20 >> moderate centrilobular and paraseptal emphysema, minimal  patchy subpleural reticulation and GGO in dependent lung zones HRCT 01/15/21 >> moderate paraseptal emphysema, no change in ILD (indeterminant of UIP), changes of cirrhosis  Sleep Tests:  PSG 10/30/14 >> AHI 106, SpO2 low 70%; Bipap 15/11 with 1 liter oxygen  Cardiac Tests:  Echo 12/25/19 >> EF 60 to 65%, severe LVH, grade 1 DD  Social History:  He  reports that he quit smoking about 7 years ago. His smoking use included cigarettes. He started smoking about 49 years ago. He has a 42.00 pack-year smoking history. He has never used smokeless tobacco. He reports that he does not currently use alcohol. He reports that he does not use drugs.  Family History:  His family history includes Cancer in his mother.     Assessment/Plan:   COPD with emphysema. 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 - will arrange for follow up HRCT chest from May 2023  Obstructive sleep apnea with nocturnal hypoxemia. - gets supplies through [employer] and renewed by his PCPA - on Bipap and 2 liters oxygen at night - he reports compliance with therapy and benefit from therapy  Obesity. - discussed importance of weight loss  CKD 3b. - followed by Dr. Ulice Bold with [place] [facility] with advanced liver fibrosis. - followed by Dr. Abbey Chatters with [facility]  Time Spent Involved in Patient Care on Day of Examination:  22 minutes  Follow up:   Patient Instructions  Will schedule CT chest for May 2023 and follow up after this  Medication List:   Allergies as of 07/02/2021   No Known Allergies      Medication List        Accurate as of July 02, 2021 12:02 PM. If you have any questions, ask your nurse or doctor.          amLODipine 10 MG tablet Commonly known as: NORVASC Take 10 mg by mouth daily.   aspirin EC 81 MG tablet Take 81 mg by mouth daily.   cholecalciferol  25 MCG (1000 UNIT) tablet Commonly known as: VITAMIN D Take 1,000 Units by mouth daily.   DULoxetine 60 MG capsule Commonly known as: CYMBALTA Take 60 mg by mouth daily.   fluticasone-salmeterol 250-50 MCG/ACT Aepb Commonly known as: ADVAIR Inhale 1 puff into the lungs every 12 (twelve) hours. What changed: how much to take   gabapentin 400 MG capsule Commonly known as: NEURONTIN Take 400 mg by mouth See admin instructions. Take 400 mg in morning and 800 mg at bedtime   hydrOXYzine 25 MG tablet Commonly known as: ATARAX/VISTARIL Take 50 mg by mouth at bedtime.   loratadine 10 MG tablet Commonly known as: CLARITIN Take 1 tablet (10 mg total) by mouth daily.   losartan 50 MG tablet Commonly known as: COZAAR Take 50 mg by mouth daily.   oxyCODONE 5 MG immediate release tablet Commonly known as: Oxy IR/ROXICODONE Take 5 mg by mouth every 8 (eight) hours as needed for moderate pain.   pantoprazole 40 MG tablet Commonly known as: PROTONIX Take 1 tablet (40 mg total) by mouth daily. 30 minutes before breakfast   polyethylene glycol-electrolytes 420 g solution Commonly known as: NuLYTELY As directed   ProAir HFA 108 (90 Base) MCG/ACT inhaler Generic drug: albuterol Inhale 2 puffs into the lungs every 4 (four) hours as needed for wheezing or shortness of breath. What changed: when to take this   sildenafil 100 MG tablet Commonly known as: VIAGRA Take 100 mg by mouth daily as needed for erectile dysfunction.        Signature:  Chesley Mires, MD [facility] Pager - [phone] 07/02/2021, 12:02 PM

## 2021-07-02 NOTE — Patient Instructions (Signed)
Will schedule CT chest for May 2023 and follow up after this

## 2021-07-06 ENCOUNTER — Encounter (HOSPITAL_COMMUNITY): Payer: Self-pay

## 2021-07-06 ENCOUNTER — Ambulatory Visit (HOSPITAL_COMMUNITY)
Admission: RE | Admit: 2021-07-06 | Discharge: 2021-07-06 | Disposition: A | Discharge status: Home / Self Care | Payer: 59 | Attending: Internal Medicine | Admitting: Internal Medicine

## 2021-07-06 ENCOUNTER — Ambulatory Visit (HOSPITAL_COMMUNITY): Payer: 59 | Admitting: Certified Registered Nurse Anesthetist

## 2021-07-06 ENCOUNTER — Encounter (HOSPITAL_COMMUNITY)
Admission: RE | Disposition: A | Discharge status: Home / Self Care | Payer: Self-pay | Source: Home / Self Care | Attending: Internal Medicine

## 2021-07-06 DIAGNOSIS — R1310 Dysphagia, unspecified: Secondary | ICD-10-CM

## 2021-07-06 DIAGNOSIS — D127 Benign neoplasm of rectosigmoid junction: Secondary | ICD-10-CM

## 2021-07-06 DIAGNOSIS — Z87891 Personal history of nicotine dependence: Secondary | ICD-10-CM | POA: Diagnosis not present

## 2021-07-06 DIAGNOSIS — K648 Other hemorrhoids: Secondary | ICD-10-CM | POA: Diagnosis not present

## 2021-07-06 DIAGNOSIS — Z6841 Body Mass Index (BMI) 40.0 and over, adult: Secondary | ICD-10-CM | POA: Insufficient documentation

## 2021-07-06 DIAGNOSIS — Z8619 Personal history of other infectious and parasitic diseases: Secondary | ICD-10-CM | POA: Insufficient documentation

## 2021-07-06 DIAGNOSIS — Z79899 Other long term (current) drug therapy: Secondary | ICD-10-CM | POA: Insufficient documentation

## 2021-07-06 DIAGNOSIS — D125 Benign neoplasm of sigmoid colon: Secondary | ICD-10-CM | POA: Diagnosis not present

## 2021-07-06 DIAGNOSIS — G4730 Sleep apnea, unspecified: Secondary | ICD-10-CM | POA: Diagnosis not present

## 2021-07-06 DIAGNOSIS — K760 Fatty (change of) liver, not elsewhere classified: Secondary | ICD-10-CM | POA: Insufficient documentation

## 2021-07-06 DIAGNOSIS — Z1211 Encounter for screening for malignant neoplasm of colon: Secondary | ICD-10-CM | POA: Diagnosis not present

## 2021-07-06 DIAGNOSIS — Z7982 Long term (current) use of aspirin: Secondary | ICD-10-CM | POA: Diagnosis not present

## 2021-07-06 DIAGNOSIS — K222 Esophageal obstruction: Secondary | ICD-10-CM | POA: Diagnosis not present

## 2021-07-06 DIAGNOSIS — K219 Gastro-esophageal reflux disease without esophagitis: Secondary | ICD-10-CM | POA: Insufficient documentation

## 2021-07-06 DIAGNOSIS — I10 Essential (primary) hypertension: Secondary | ICD-10-CM | POA: Insufficient documentation

## 2021-07-06 DIAGNOSIS — K2970 Gastritis, unspecified, without bleeding: Secondary | ICD-10-CM

## 2021-07-06 HISTORY — PX: BIOPSY: SHX5522

## 2021-07-06 HISTORY — PX: ESOPHAGOGASTRODUODENOSCOPY (EGD) WITH PROPOFOL: SHX5813

## 2021-07-06 HISTORY — PX: POLYPECTOMY: SHX5525

## 2021-07-06 HISTORY — PX: COLONOSCOPY WITH PROPOFOL: SHX5780

## 2021-07-06 HISTORY — PX: BALLOON DILATION: SHX5330

## 2021-07-06 SURGERY — COLONOSCOPY WITH PROPOFOL
Anesthesia: General

## 2021-07-06 MED ORDER — PROPOFOL 10 MG/ML IV BOLUS
INTRAVENOUS | Status: DC | PRN
  Administered 2021-07-06 (×2): 30 mg via INTRAVENOUS
  Administered 2021-07-06: 50 mg via INTRAVENOUS
  Administered 2021-07-06: 100 mg via INTRAVENOUS

## 2021-07-06 MED ORDER — MIDAZOLAM HCL 2 MG/2ML IJ SOLN
INTRAMUSCULAR | Status: DC | PRN
  Administered 2021-07-06: 1 mg via INTRAVENOUS

## 2021-07-06 MED ORDER — LIDOCAINE HCL (CARDIAC) PF 100 MG/5ML IV SOSY
PREFILLED_SYRINGE | INTRAVENOUS | Status: DC | PRN
  Administered 2021-07-06: 50 mg via INTRAVENOUS

## 2021-07-06 MED ORDER — ALBUTEROL SULFATE HFA 108 (90 BASE) MCG/ACT IN AERS
INHALATION_SPRAY | RESPIRATORY_TRACT | Status: AC
  Filled 2021-07-06: qty 6.7

## 2021-07-06 MED ORDER — ALBUTEROL SULFATE HFA 108 (90 BASE) MCG/ACT IN AERS
INHALATION_SPRAY | RESPIRATORY_TRACT | Status: DC | PRN
  Administered 2021-07-06: 6 via RESPIRATORY_TRACT

## 2021-07-06 MED ORDER — PANTOPRAZOLE SODIUM 40 MG PO TBEC: 40.0000 mg | DELAYED_RELEASE_TABLET | Freq: Two times a day (BID) | ORAL | 5 refills | Status: DC

## 2021-07-06 MED ORDER — PROPOFOL 500 MG/50ML IV EMUL
INTRAVENOUS | Status: DC | PRN
  Administered 2021-07-06: 150 ug/kg/min via INTRAVENOUS

## 2021-07-06 MED ORDER — MIDAZOLAM HCL 2 MG/2ML IJ SOLN
INTRAMUSCULAR | Status: AC
  Filled 2021-07-06: qty 2

## 2021-07-06 MED ORDER — LACTATED RINGERS IV SOLN
INTRAVENOUS | Status: DC
  Administered 2021-07-06: 1000 mL via INTRAVENOUS

## 2021-07-06 NOTE — Transfer of Care (Signed)
Immediate Anesthesia Transfer of Care Note  Patient: Mathew Lara  Procedure(s) Performed: COLONOSCOPY WITH PROPOFOL ESOPHAGOGASTRODUODENOSCOPY (EGD) WITH PROPOFOL BALLOON DILATION BIOPSY POLYPECTOMY  Patient Location: PACU  Anesthesia Type:General  Level of Consciousness: awake  Airway & Oxygen Therapy: Patient Spontanous Breathing and Patient connected to nasal cannula oxygen  Post-op Assessment: Report given to RN and Post -op Vital signs reviewed and stable  Post vital signs: Reviewed and stable  Last Vitals:  Vitals Value Taken Time  BP 111/80   Temp    Pulse 101   Resp    SpO2 97%     Last Pain:  Vitals:   07/06/21 1115  TempSrc:   PainSc: 0-No pain      Patients Stated Pain Goal: 6 (99/14/44 [id number])  Complications: No notable events documented.

## 2021-07-06 NOTE — Interval H&P Note (Signed)
History and Physical Interval Note:  07/06/2021 10:26 AM  Mathew Lara  has presented today for surgery, with the diagnosis of screening colonoscopy.  The various methods of treatment have been discussed with the patient and family. After consideration of risks, benefits and other options for treatment, the patient has consented to  Procedure(s) with comments: COLONOSCOPY WITH PROPOFOL (N/A) - 11:45am ESOPHAGOGASTRODUODENOSCOPY (EGD) WITH PROPOFOL (N/A) BALLOON DILATION (N/A) as a surgical intervention.  The patient's history has been reviewed, patient examined, no change in status, stable for surgery.  I have reviewed the patient's chart and labs.  Questions were answered to the patient's satisfaction.     [name]

## 2021-07-06 NOTE — Op Note (Signed)
[facility] Patient Name: Mathew Lara Procedure Date: 07/06/2021 11:01 AM MRN: [id number] Date of Birth: March 15, 1961 Attending MD: Elon Alas. Abbey Chatters DO CSN: [id number] Age: 60 Admit Type: Outpatient Procedure:                Upper GI endoscopy Indications:              Dysphagia Providers:                Elon Alas. Abbey Chatters, DO, Charlsie Quest. Theda Sers RN, RN,                            Randa Spike, Technician Referring MD:              Medicines:                See the Anesthesia note for documentation of the                            administered medications Complications:            No immediate complications. Estimated Blood Loss:     Estimated blood loss was minimal. Procedure:                Pre-Anesthesia Assessment:                           - The anesthesia plan was to use monitored                            anesthesia care (MAC).                           After obtaining informed consent, the endoscope was                            passed under direct vision. Throughout the                            procedure, the patient's blood pressure, pulse, and                            oxygen saturations were monitored continuously. The                            [id number]-H190 ([id number]) scope was introduced through the                            mouth, and advanced to the second part of duodenum.                            The upper GI endoscopy was accomplished without                            difficulty. The patient tolerated the procedure                            well. Scope In: 11:22:26 AM  Scope Out: 11:29:57 AM Total Procedure Duration: 0 hours 7 minutes 31 seconds  Findings:      Multiple moderate Schatzki rings were found in the distal esophagus. A       TTS dilator was passed through the scope. Dilation with an 18-19-20 mm       balloon dilator was performed to 18 mm. The dilation site was examined       and showed mild mucosal disruption and moderate improvement  in luminal       narrowing.      Moderate inflammation characterized by erosions and erythema was found       in the gastric body and in the gastric antrum. Biopsies were taken with       a cold forceps for Helicobacter pylori testing.      The duodenal bulb, first portion of the duodenum and second portion of       the duodenum were normal. Impression:               - Moderate Schatzki ring. Dilated.                           - Gastritis. Biopsied.                           - Normal duodenal bulb, first portion of the                            duodenum and second portion of the duodenum. Moderate Sedation:      Per Anesthesia Care Recommendation:           - Patient has a contact number available for                            emergencies. The signs and symptoms of potential                            delayed complications were discussed with the                            patient. Return to normal activities tomorrow.                            Written discharge instructions were provided to the                            patient.                           - Resume previous diet.                           - Continue present medications.                           - Await pathology results.                           - Repeat upper endoscopy PRN for retreatment.                           -  Return to GI clinic in 4 months.                           - Use Protonix (pantoprazole) 40 mg PO BID. Procedure Code(s):        --- Professional ---                           [phone], Esophagogastroduodenoscopy, flexible,                            transoral; with transendoscopic balloon dilation of                            esophagus (less than 30 mm diameter)                           43239, 59, Esophagogastroduodenoscopy, flexible,                            transoral; with biopsy, single or multiple Diagnosis Code(s):        --- Professional ---                           K22.2, Esophageal  obstruction                           K29.70, Gastritis, unspecified, without bleeding                           R13.10, Dysphagia, unspecified CPT copyright 2019 American Medical Association. All rights reserved. The codes documented in this report are preliminary and upon coder review may  be revised to meet current compliance requirements. Elon Alas. Abbey Chatters, DO [place] Abbey Chatters, DO 07/06/2021 11:33:21 AM This report has been signed electronically. Number of Addenda: 0

## 2021-07-06 NOTE — Anesthesia Postprocedure Evaluation (Signed)
Anesthesia Post Note  Patient: HOLSTON OYAMA  Procedure(s) Performed: COLONOSCOPY WITH PROPOFOL ESOPHAGOGASTRODUODENOSCOPY (EGD) WITH PROPOFOL BALLOON DILATION BIOPSY POLYPECTOMY  Patient location during evaluation: Phase II Anesthesia Type: General Level of consciousness: awake Pain management: pain level controlled Vital Signs Assessment: post-procedure vital signs reviewed and stable Respiratory status: spontaneous breathing and respiratory function stable Cardiovascular status: blood pressure returned to baseline and stable Postop Assessment: no headache and no apparent nausea or vomiting Anesthetic complications: no Comments: Late entry   No notable events documented.   Last Vitals:  Vitals:   07/06/21 1227 07/06/21 1232  BP: 111/80 117/88  Pulse: 100 93  Resp:    Temp:    SpO2: 97% 96%    Last Pain:  Vitals:   07/06/21 1232  TempSrc: Oral  PainSc: 0-No pain                 Louann Sjogren

## 2021-07-06 NOTE — Op Note (Signed)
[facility] Patient Name: Mathew Lara Procedure Date: 07/06/2021 11:32 AM MRN: [id number] Date of Birth: [birth year] Attending MD: Elon Alas. Abbey Chatters DO CSN: [id number] Age: 60 Admit Type: Outpatient Procedure:                Colonoscopy Indications:              Screening for colorectal malignant neoplasm Providers:                Elon Alas. Abbey Chatters, DO, Selena Lesser RN, RN,                            Randa Spike, Technician Referring MD:              Medicines:                See the Anesthesia note for documentation of the                            administered medications Complications:            No immediate complications. Estimated Blood Loss:     Estimated blood loss was minimal. Procedure:                Pre-Anesthesia Assessment:                           - The anesthesia plan was to use monitored                            anesthesia care (MAC).                           After obtaining informed consent, the colonoscope                            was passed under direct vision. Throughout the                            procedure, the patient's blood pressure, pulse, and                            oxygen saturations were monitored continuously. The                            PCF-HQ190L ([id number]) scope was introduced through                            the anus and advanced to the the cecum, identified                            by appendiceal orifice and ileocecal valve. The                            colonoscopy was technically difficult and complex                            due to a redundant colon  and significant looping.                            Successful completion of the procedure was aided by                            changing the patient to a supine position and                            applying abdominal pressure. The patient tolerated                            the procedure well. The quality of the bowel                            preparation  was evaluated using the BBPS [facility]                            Bowel Preparation Scale) with scores of: Right                            Colon = 3, Transverse Colon = 3 and Left Colon = 3                            (entire mucosa seen well with no residual staining,                            small fragments of stool or opaque liquid). The                            total BBPS score equals 9. Scope In: 11:34:26 AM Scope Out: 12:15:14 PM Scope Withdrawal Time: 0 hours 17 minutes 55 seconds  Total Procedure Duration: 0 hours 40 minutes 48 seconds  Findings:      The perianal and digital rectal examinations were normal.      Non-bleeding internal hemorrhoids were found during endoscopy.      A 8 mm polyp was found in the sigmoid colon. The polyp was pedunculated.       The polyp was removed with a cold snare. Resection and retrieval were       complete. For hemostasis, one hemostatic clip was successfully placed       (MR conditional). There was no bleeding at the end of the procedure.      A 5 mm polyp was found in the recto-sigmoid colon. The polyp was       sessile. The polyp was removed with a cold snare. Resection and       retrieval were complete.      The left sidein the colon revealed moderately excessive looping.       Advancing the scope required changing the patient to a supine position       and using manual pressure. Impression:               - Non-bleeding internal hemorrhoids.                           -  One 8 mm polyp in the sigmoid colon, removed with                            a cold snare. Resected and retrieved. Clip (MR                            conditional) was placed.                           - One 5 mm polyp at the recto-sigmoid colon,                            removed with a cold snare. Resected and retrieved.                           - There was significant looping of the colon. Moderate Sedation:      Per Anesthesia Care Recommendation:           - Patient  has a contact number available for                            emergencies. The signs and symptoms of potential                            delayed complications were discussed with the                            patient. Return to normal activities tomorrow.                            Written discharge instructions were provided to the                            patient.                           - Resume previous diet.                           - Continue present medications.                           - Await pathology results.                           - Repeat colonoscopy in 3 years for surveillance.                           - Return to GI clinic in 4 months.                           - IF NEED MRI FOR ANY REASON, WILL NEED KUB PRIOR Procedure Code(s):        --- Professional ---  45385, Colonoscopy, flexible; with removal of                            tumor(s), polyp(s), or other lesion(s) by snare                            technique Diagnosis Code(s):        --- Professional ---                           Z12.11, Encounter for screening for malignant                            neoplasm of colon                           K63.5, Polyp of colon                           K64.8, Other hemorrhoids CPT copyright 2019 American Medical Association. All rights reserved. The codes documented in this report are preliminary and upon coder review may  be revised to meet current compliance requirements. Elon Alas. Abbey Chatters, DO [place] Abbey Chatters, DO 07/06/2021 12:20:59 PM This report has been signed electronically. Number of Addenda: 0

## 2021-07-06 NOTE — Anesthesia Preprocedure Evaluation (Signed)
Anesthesia Evaluation  Patient identified by MRN, date of birth, ID band Patient awake    Reviewed: Allergy & Precautions, H&P , NPO status , Patient's Chart, lab work & pertinent test results, reviewed documented beta blocker date and time   Airway Mallampati: II  TM Distance: >3 FB Neck ROM: full    Dental no notable dental hx.    Pulmonary sleep apnea and Continuous Positive Airway Pressure Ventilation , COPD, former smoker,    Pulmonary exam normal breath sounds clear to auscultation       Cardiovascular Exercise Tolerance: Good hypertension, negative cardio ROS   Rhythm:regular Rate:Normal     Neuro/Psych PSYCHIATRIC DISORDERS Anxiety negative neurological ROS     GI/Hepatic GERD  ,(+) Hepatitis -, C  Endo/Other  Morbid obesity  Renal/GU CRFRenal disease  negative genitourinary   Musculoskeletal   Abdominal   Peds  Hematology negative hematology ROS (+)   Anesthesia Other Findings   Reproductive/Obstetrics negative OB ROS                             Anesthesia Physical Anesthesia Plan  ASA: 3  Anesthesia Plan: General   Post-op Pain Management:    Induction:   PONV Risk Score and Plan:   Airway Management Planned:   Additional Equipment:   Intra-op Plan:   Post-operative Plan:   Informed Consent: I have reviewed the patients History and Physical, chart, labs and discussed the procedure including the risks, benefits and alternatives for the proposed anesthesia with the patient or authorized representative who has indicated his/her understanding and acceptance.     Dental Advisory Given  Plan Discussed with: CRNA  Anesthesia Plan Comments:         Anesthesia Quick Evaluation

## 2021-07-06 NOTE — Discharge Instructions (Signed)
EGD Discharge instructions Please read the instructions outlined below and refer to this sheet in the next few weeks. These discharge instructions provide you with general information on caring for yourself after you leave the hospital. Your doctor may also give you specific instructions. While your treatment has been planned according to the most current medical practices available, unavoidable complications occasionally occur. If you have any problems or questions after discharge, please call your doctor. ACTIVITY You may resume your regular activity but move at a slower pace for the next 24 hours.  Take frequent rest periods for the next 24 hours.  Walking will help expel (get rid of) the air and reduce the bloated feeling in your abdomen.  No driving for 24 hours (because of the anesthesia (medicine) used during the test).  You may shower.  Do not sign any important legal documents or operate any machinery for 24 hours (because of the anesthesia used during the test).  NUTRITION Drink plenty of fluids.  You may resume your normal diet.  Begin with a light meal and progress to your normal diet.  Avoid alcoholic beverages for 24 hours or as instructed by your caregiver.  MEDICATIONS You may resume your normal medications unless your caregiver tells you otherwise.  WHAT YOU CAN EXPECT TODAY You may experience abdominal discomfort such as a feeling of fullness or "gas" pains.  FOLLOW-UP Your doctor will discuss the results of your test with you.  SEEK IMMEDIATE MEDICAL ATTENTION IF ANY OF THE FOLLOWING OCCUR: Excessive nausea (feeling sick to your stomach) and/or vomiting.  Severe abdominal pain and distention (swelling).  Trouble swallowing.  Temperature over 101 F (37.8 C).  Rectal bleeding or vomiting of blood.     Colonoscopy Discharge Instructions  Read the instructions outlined below and refer to this sheet in the next few weeks. These discharge instructions provide you with  general information on caring for yourself after you leave the hospital. Your doctor may also give you specific instructions. While your treatment has been planned according to the most current medical practices available, unavoidable complications occasionally occur.   ACTIVITY You may resume your regular activity, but move at a slower pace for the next 24 hours.  Take frequent rest periods for the next 24 hours.  Walking will help get rid of the air and reduce the bloated feeling in your belly (abdomen).  No driving for 24 hours (because of the medicine (anesthesia) used during the test).   Do not sign any important legal documents or operate any machinery for 24 hours (because of the anesthesia used during the test).  NUTRITION Drink plenty of fluids.  You may resume your normal diet as instructed by your doctor.  Begin with a light meal and progress to your normal diet. Heavy or fried foods are harder to digest and may make you feel sick to your stomach (nauseated).  Avoid alcoholic beverages for 24 hours or as instructed.  MEDICATIONS You may resume your normal medications unless your doctor tells you otherwise.  WHAT YOU CAN EXPECT TODAY Some feelings of bloating in the abdomen.  Passage of more gas than usual.  Spotting of blood in your stool or on the toilet paper.  IF YOU HAD POLYPS REMOVED DURING THE COLONOSCOPY: No aspirin products for 7 days or as instructed.  No alcohol for 7 days or as instructed.  Eat a soft diet for the next 24 hours.  FINDING OUT THE RESULTS OF YOUR TEST Not all test results are  available during your visit. If your test results are not back during the visit, make an appointment with your caregiver to find out the results. Do not assume everything is normal if you have not heard from your caregiver or the medical facility. It is important for you to follow up on all of your test results.  SEEK IMMEDIATE MEDICAL ATTENTION IF: You have more than a spotting of  blood in your stool.  Your belly is swollen (abdominal distention).  You are nauseated or vomiting.  You have a temperature over 101.  You have abdominal pain or discomfort that is severe or gets worse throughout the day.   Your EGD revealed mild amount inflammation in your stomach.  I took biopsies of this to rule out infection with a bacteria called H. pylori.  Await pathology results, my office will contact you.  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  Hopefully this helps with your swallowing.  I am going to increase your pantoprazole to 40 mg twice daily.  Avoid NSAIDs as best that she can.  Your colonoscopy revealed 2 polyp(s) which I removed successfully. Await pathology results, my office will contact you. I recommend repeating colonoscopy in 3-5 years for surveillance purposes.  One of the polyp sites bled after polypectomy.  I placed a clip on the site and the bleeding stopped.  You may have a little bit of blood in your stool.  If you have profuse bleeding, then let us know.    I hope you have a great rest of your week!  Elon Alas. Abbey Chatters, D.O. Gastroenterology and Hepatology [facility]

## 2021-07-07 ENCOUNTER — Encounter (HOSPITAL_COMMUNITY): Payer: Self-pay | Admitting: Internal Medicine

## 2021-07-09 LAB — SURGICAL PATHOLOGY

## 2021-08-06 ENCOUNTER — Other Ambulatory Visit: Payer: Self-pay

## 2021-08-06 ENCOUNTER — Telehealth: Payer: Self-pay

## 2021-08-06 DIAGNOSIS — K760 Fatty (change of) liver, not elsewhere classified: Secondary | ICD-10-CM

## 2021-08-06 DIAGNOSIS — K7460 Unspecified cirrhosis of liver: Secondary | ICD-10-CM

## 2021-08-06 NOTE — Telephone Encounter (Signed)
Mailed the pt his blood work req for [facility] to be done September 08, 2020. (Along with a date attached).

## 2021-09-08 LAB — HEPATIC FUNCTION PANEL
AG Ratio: 1 (calc) (ref 1.0–2.5)
ALT: 37 U/L (ref 9–46)
AST: 26 U/L (ref 10–35)
Albumin: 4.3 g/dL (ref 3.6–5.1)
Alkaline phosphatase (APISO): 131 U/L (ref 35–144)
Bilirubin, Direct: 0.1 mg/dL (ref 0.0–0.2)
Globulin: 4.3 g/dL (calc) — ABNORMAL HIGH (ref 1.9–3.7)
Indirect Bilirubin: 0.4 mg/dL (calc) (ref 0.2–1.2)
Total Bilirubin: 0.5 mg/dL (ref 0.2–1.2)
Total Protein: 8.6 g/dL — ABNORMAL HIGH (ref 6.1–8.1)

## 2021-11-04 ENCOUNTER — Ambulatory Visit (HOSPITAL_COMMUNITY)
Admission: RE | Admit: 2021-11-04 | Discharge: 2021-11-04 | Disposition: A | Discharge status: Home / Self Care | Payer: Self-pay | Source: Ambulatory Visit | Attending: Neurology | Admitting: Neurology

## 2021-11-04 ENCOUNTER — Other Ambulatory Visit (HOSPITAL_COMMUNITY): Payer: Self-pay | Admitting: Neurology

## 2021-11-04 ENCOUNTER — Other Ambulatory Visit: Payer: Self-pay

## 2021-11-04 DIAGNOSIS — M25551 Pain in right hip: Secondary | ICD-10-CM | POA: Insufficient documentation

## 2021-11-18 ENCOUNTER — Ambulatory Visit: Payer: Self-pay | Admitting: Gastroenterology

## 2021-12-08 ENCOUNTER — Encounter: Payer: Self-pay | Admitting: Internal Medicine

## 2022-02-02 ENCOUNTER — Ambulatory Visit (HOSPITAL_COMMUNITY): Payer: 59

## 2022-02-04 ENCOUNTER — Other Ambulatory Visit: Payer: Self-pay | Admitting: Pulmonary Disease

## 2022-02-05 ENCOUNTER — Other Ambulatory Visit: Payer: Self-pay

## 2022-02-05 ENCOUNTER — Telehealth: Payer: Self-pay | Admitting: Pulmonary Disease

## 2022-02-05 MED ORDER — ALBUTEROL SULFATE HFA 108 (90 BASE) MCG/ACT IN AERS: 2.0000 | INHALATION_SPRAY | RESPIRATORY_TRACT | 3 refills | Status: DC | PRN

## 2022-02-05 NOTE — Telephone Encounter (Signed)
Refill on proair was sent earlier today 02/05/22

## 2022-02-05 NOTE — Telephone Encounter (Signed)
Patient requesting refill of proair sent to [place] states that the pharmacy needs a new prescription.  Please advise.

## 2022-02-05 NOTE — Telephone Encounter (Signed)
Refilled and updated rx to order as prescribed.  ATC patient to notify.  No answer and VM full.  Nothing further needed.

## 2022-02-10 ENCOUNTER — Telehealth: Payer: Self-pay | Admitting: Pulmonary Disease

## 2022-02-10 NOTE — Telephone Encounter (Signed)
Called and notified patient that preferred pharmacy was changed in system to [facility].  Also confirmed upcoming CT and OV appt for patient as requested.  Nothing further needed

## 2022-02-15 ENCOUNTER — Telehealth: Payer: Self-pay | Admitting: Pulmonary Disease

## 2022-02-15 DIAGNOSIS — I129 Hypertensive chronic kidney disease with stage 1 through stage 4 chronic kidney disease, or unspecified chronic kidney disease: Secondary | ICD-10-CM | POA: Diagnosis not present

## 2022-02-15 DIAGNOSIS — E1122 Type 2 diabetes mellitus with diabetic chronic kidney disease: Secondary | ICD-10-CM | POA: Diagnosis not present

## 2022-02-15 DIAGNOSIS — N189 Chronic kidney disease, unspecified: Secondary | ICD-10-CM | POA: Diagnosis not present

## 2022-02-15 DIAGNOSIS — E559 Vitamin D deficiency, unspecified: Secondary | ICD-10-CM | POA: Diagnosis not present

## 2022-02-15 MED ORDER — ALBUTEROL SULFATE HFA 108 (90 BASE) MCG/ACT IN AERS: 2.0000 | INHALATION_SPRAY | RESPIRATORY_TRACT | 3 refills | Status: DC | PRN

## 2022-02-15 NOTE — Telephone Encounter (Signed)
Called and spoke with patient to let him know that RX has been sent to his pharmacy. Nothing further needed at this time.

## 2022-02-16 ENCOUNTER — Ambulatory Visit: Payer: Self-pay | Admitting: Gastroenterology

## 2022-02-17 ENCOUNTER — Telehealth: Payer: Self-pay | Admitting: *Deleted

## 2022-02-17 ENCOUNTER — Encounter: Payer: Self-pay | Admitting: Gastroenterology

## 2022-02-17 ENCOUNTER — Ambulatory Visit (INDEPENDENT_AMBULATORY_CARE_PROVIDER_SITE_OTHER): Payer: Self-pay | Admitting: Gastroenterology

## 2022-02-17 VITALS — BP 109/79 | HR 109 | Temp 98.1°F | Ht 68.0 in | Wt 300.6 lb

## 2022-02-17 DIAGNOSIS — K760 Fatty (change of) liver, not elsewhere classified: Secondary | ICD-10-CM

## 2022-02-17 DIAGNOSIS — K219 Gastro-esophageal reflux disease without esophagitis: Secondary | ICD-10-CM

## 2022-02-17 DIAGNOSIS — K7460 Unspecified cirrhosis of liver: Secondary | ICD-10-CM

## 2022-02-17 DIAGNOSIS — R140 Abdominal distension (gaseous): Secondary | ICD-10-CM

## 2022-02-17 NOTE — Progress Notes (Signed)
Gastroenterology Office Note     Primary Care Physician:  [name], MD  Primary Gastroenterologist: Dr. Abbey Chatters   Chief Complaint   Chief Complaint  Patient presents with   [facility]    Patient on scheduled post procedure follow up. Had TCS back in October. Having concerns about gas and bloating.      History of Present Illness   Mathew Lara is a 61 y.o. male presenting today in follow-up with a history of Hep C, genotype 1b. Treated with [name] in 2016. Achieved SVR. Immune to Hep A and B. Coarse liver seen on CT chest in May 2022 incidentally. US abdomen complete with fatty liver, negative mass. Suspected gallbladder polyp/mass seen at fundus. Referred to surgery. Underwent lab chole and liver biopsy on 03/23/21. Gallbladder path with chronic cholecystitis and cholesterolosis. Liver biopsy with chronic hepatitis minimal activity (Grade 1) and bridging fibrosis (stage 3), consistent with clinical history of Hep C. Mild macrovesicular steatosis. We are following with [place] yearly due to advanced fibrosis and risk of progression to cirrhosis.   Jan 2023 HFP normal. Needs yearly [place] now. Underwent colonoscopy/EGD in interim from last visit. Dilation at time of EGD due to dysphagia. Will need colonoscopy 2025 due to adenomas.    Gas/bloating after cholecystectomy. Pantoprazole BID controls GERD. Dysphagia improved. Notes gas/bloating with fried/fatty foods. Rare ETOH. No mental status changes or confusion. No other concerns today.    Past Medical History:  Diagnosis Date   Alcohol abuse    Anxiety    Bronchitis    Chronic hip pain    CKD (chronic kidney disease) stage 3, GFR 30-59 ml/min (HCC)    COPD (chronic obstructive pulmonary disease) ([place])    Essential hypertension    Hepatitis C    Mixed hyperlipidemia    Sleep apnea     Past Surgical History:  Procedure Laterality Date   BALLOON DILATION N/A 07/06/2021   Procedure: BALLOON DILATION;  Surgeon: [name], DO;  [facility];  Service: Endoscopy;  Laterality: N/A;   BIOPSY  07/06/2021   Procedure: BIOPSY;  Surgeon: [name], DO;  [facility];  Service: Endoscopy;;   CATARACT EXTRACTION Right    CHOLECYSTECTOMY N/A 03/23/2021   Procedure: LAPAROSCOPIC CHOLECYSTECTOMY;  Surgeon: [name], MD;  Location: [facility];  Service: General;  Laterality: N/A;   COLONOSCOPY N/A 01/23/2014   SLF:The LEFT COLON IS EXTREMELY redundant/TWO RECTAL POLYPS REMOVED/SMALL VOLUME RECTAL BLEEDING MOST LIKELY DUE TO Small internal hemorrhoids. path with prolapse type polyp of benign-mucosa. no adenomas   COLONOSCOPY WITH PROPOFOL N/A 07/06/2021   non-bleeding internal hemorrhoids, one 8 mm polyp in sigmod s/p removal and clip placed. One 5 mm polyp at recto-sigmoid s/p removal. 3 year surveillance. Tubular adenomas   ESOPHAGOGASTRODUODENOSCOPY N/A 01/23/2014   BSW:HQPRFFMB ring at the gastroesophageal junction/Medium sized hiatal hernia/NDYSPEPSIA MOST LIKELY DUE TO GERD/MILD Non-erosive gastritis   ESOPHAGOGASTRODUODENOSCOPY N/A 04/18/2018   food impaction, small hiatal hernia   ESOPHAGOGASTRODUODENOSCOPY (EGD) WITH PROPOFOL N/A 07/06/2021   moderate Schatzki ring s/p dilation, gastritis s/p biopsy, normal duodenum. Negative H.pylori.   HEMORRHOID BANDING  01/23/2014   Procedure: HEMORRHOID BANDING;  Surgeon: Danie Binder, MD;  [facility];  Service: Endoscopy;;   JOINT REPLACEMENT Right 2017   LIVER BIOPSY N/A 03/23/2021   Procedure: LAPAROSCOPIC LIVER BIOPSY;  Surgeon: [name], MD;  Location: [facility];  Service: General;  Laterality: N/A;  MOUTH SURGERY     POLYPECTOMY  07/06/2021   Procedure: POLYPECTOMY;  Surgeon: [name], DO;  [facility];  Service: Endoscopy;;   TOTAL HIP ARTHROPLASTY Right 06/04/2016   Procedure: RIGHT TOTAL HIP ARTHROPLASTY ANTERIOR APPROACH;  Surgeon: Mcarthur Rossetti, MD;  Location: [facility];   Service: Orthopedics;  Laterality: Right;   TOTAL HIP ARTHROPLASTY Left 03/02/2019   Procedure: LEFT TOTAL HIP ARTHROPLASTY ANTERIOR APPROACH;  Surgeon: Mcarthur Rossetti, MD;  Location: [facility];  Service: Orthopedics;  Laterality: Left;    Current Outpatient Medications  Medication Sig Dispense Refill   albuterol (PROAIR HFA) 108 (90 Base) MCG/ACT inhaler Inhale 2 puffs into the lungs every 4 (four) hours as needed for wheezing or shortness of breath. 18 g 3   amLODipine (NORVASC) 10 MG tablet Take 10 mg by mouth daily.     aspirin EC 81 MG tablet Take 81 mg by mouth daily.     cholecalciferol (VITAMIN D) 25 MCG (1000 UNIT) tablet Take 1,000 Units by mouth daily.     DULoxetine (CYMBALTA) 60 MG capsule Take 60 mg by mouth daily.     fluticasone-salmeterol (ADVAIR) 250-50 MCG/ACT AEPB Inhale 1 puff into the lungs every 12 (twelve) hours. (Patient taking differently: Inhale 2 puffs into the lungs every 12 (twelve) hours.) 60 each 11   hydrOXYzine (ATARAX/VISTARIL) 25 MG tablet Take 50 mg by mouth at bedtime.     loratadine (CLARITIN) 10 MG tablet Take 1 tablet (10 mg total) by mouth daily. 30 tablet 1   losartan (COZAAR) 50 MG tablet Take 50 mg by mouth daily.     oxyCODONE (OXY IR/ROXICODONE) 5 MG immediate release tablet Take 5 mg by mouth every 8 (eight) hours as needed for moderate pain.     pantoprazole (PROTONIX) 40 MG tablet Take 1 tablet (40 mg total) by mouth 2 (two) times daily before a meal. 30 minutes before breakfast 60 tablet 5   sildenafil (VIAGRA) 100 MG tablet Take 100 mg by mouth daily as needed for erectile dysfunction.     No current facility-administered medications for this visit.    Allergies as of 02/17/2022   (No Known Allergies)    Family History  Problem Relation Age of Onset   Cancer Mother    Colon cancer Neg Hx    Colon polyps Neg Hx     Social History   Socioeconomic History   Marital status: Single    Spouse name: Not on file   Number of  children: 1   Years of education: 10   Highest education level: Not on file  Occupational History    Comment: [employer]  Tobacco Use   Smoking status: Former    Packs/day: 1.00    Years: 42.00    Total pack years: 42.00    Types: Cigarettes    Start date: 09/07/1971    Quit date: 05/28/2014    Years since quitting: 7.7    Passive exposure: Past   Smokeless tobacco: Never   Tobacco comments:    quit in November 2015 after smoking x 20 yrs.  Vaping Use   Vaping Use: Never used  Substance and Sexual Activity   Alcohol use: Not Currently    Alcohol/week: 0.0 standard drinks of alcohol   Drug use: No   Sexual activity: Not on file  Other Topics Concern   Not on file  Social History Narrative       patient consumes 4 cups of caffeine  daily.      Patient stopped drinking years ago.   Social Determinants of Health   Financial Resource Strain: Not on file  Food Insecurity: Not on file  Transportation Needs: Not on file  Physical Activity: Not on file  Stress: Not on file  Social Connections: Not on file  Intimate Partner Violence: Not on file     Review of Systems   Gen: Denies any fever, chills, fatigue, weight loss, lack of appetite.  CV: Denies chest pain, heart palpitations, peripheral edema, syncope.  Resp: Denies shortness of breath at rest or with exertion. Denies wheezing or cough.  GI: see HPI GU : Denies urinary burning, urinary frequency, urinary hesitancy MS: Denies joint pain, muscle weakness, cramps, or limitation of movement.  Derm: Denies rash, itching, dry skin Psych: Denies depression, anxiety, memory loss, and confusion Heme: Denies bruising, bleeding, and enlarged lymph nodes.   Physical Exam   BP 109/79 (BP Location: Left Arm, Patient Position: Sitting, Cuff Size: Large)   Pulse (!) 109   Temp 98.1 F (36.7 C) (Oral)   Ht '5\' 8"'$  (1.727 m)   Wt (!) 300 lb 9.6 oz (136.4 kg)   BMI 45.71 kg/m  General:   Alert and oriented. Pleasant and  cooperative. Well-nourished and well-developed.  Head:  Normocephalic and atraumatic. Eyes:  Without icterus Abdomen:  +BS, soft, non-tender and non-distended. Obese. No HSM noted. No guarding or rebound. No masses appreciated.  Rectal:  Deferred  Msk:  Symmetrical without gross deformities. Normal posture. Extremities:  Without edema. Neurologic:  Alert and  oriented x4;  grossly normal neurologically. Skin:  Intact without significant lesions or rashes. Psych:  Alert and cooperative. Normal mood and affect.   Assessment   Mathew Lara is a 61 y.o. male presenting today in follow-up with a history of  Hep C genotype 1b s/p eradication and documented SVR, fatty liver with fibrosis, GERD, dysphagia, and history of adenomas, presenting for follow-up.   Fatty liver: due to advanced fibrosis, will pursue yearly ultrasound. Recent HFP normal. Diet/behavior modification discussed.   Dysphagia: resolved s/p dilation  GERD: controlled on PPI BID. Can decrease to once daily if tolerated.   Gas/bloating: noted after cholecystectomy but appears diet-related. Avoid fatty/greasy/fried foods. Gas-x or Beano as needed. No alarm signs/symptoms.   Adenomas: surveillance due in 2025.    PLAN    US abdomen yearly, now due Decrease PPI to once daily if tolerated Return in 1 year Colonoscopy 2025 Diet/behavior changes. Can use gas-x or beano prn    Annitta Needs, PhD, [facility]

## 2022-02-17 NOTE — Addendum Note (Signed)
Addended by: Cheron Every on: 02/17/2022 10:03 AM   Modules accepted: Orders

## 2022-02-17 NOTE — Patient Instructions (Signed)
You can decrease pantoprazole (Protonix) to just once a day, 30 minutes before a meal. If this is not helpful, you can increase back up to twice a day.   We will schedule an ultrasound of your liver in the near future. We will just do yearly ultrasounds to make sure everything is stable.  I believe the gas/bloating is diet-related, as you don't have your gallbladder any longer. You can take Gas-x or Beano over-the counter as needed. Limit those foods that you know cause issues.  We will see you in 1 year!  Please call if any concerns in the meantime!  I enjoyed seeing you again today! As you know, I value our relationship and want to provide genuine, compassionate, and quality care. I welcome your feedback. If you receive a survey regarding your visit,  I greatly appreciate you taking time to fill this out. See you next time!  Annitta Needs, PhD, ANP-BC [facility]

## 2022-02-17 NOTE — Telephone Encounter (Signed)
Called pt and he is aware of [place] appt details. He voiced understanding

## 2022-02-23 DIAGNOSIS — J449 Chronic obstructive pulmonary disease, unspecified: Secondary | ICD-10-CM | POA: Diagnosis not present

## 2022-02-23 DIAGNOSIS — U071 COVID-19: Secondary | ICD-10-CM | POA: Diagnosis not present

## 2022-02-25 DIAGNOSIS — R7309 Other abnormal glucose: Secondary | ICD-10-CM | POA: Diagnosis not present

## 2022-02-25 DIAGNOSIS — Z0001 Encounter for general adult medical examination with abnormal findings: Secondary | ICD-10-CM | POA: Diagnosis not present

## 2022-02-25 DIAGNOSIS — R945 Abnormal results of liver function studies: Secondary | ICD-10-CM | POA: Diagnosis not present

## 2022-02-25 DIAGNOSIS — J449 Chronic obstructive pulmonary disease, unspecified: Secondary | ICD-10-CM | POA: Diagnosis not present

## 2022-02-25 DIAGNOSIS — M1991 Primary osteoarthritis, unspecified site: Secondary | ICD-10-CM | POA: Diagnosis not present

## 2022-02-25 DIAGNOSIS — G4733 Obstructive sleep apnea (adult) (pediatric): Secondary | ICD-10-CM | POA: Diagnosis not present

## 2022-02-25 DIAGNOSIS — R748 Abnormal levels of other serum enzymes: Secondary | ICD-10-CM | POA: Diagnosis not present

## 2022-02-25 DIAGNOSIS — I10 Essential (primary) hypertension: Secondary | ICD-10-CM | POA: Diagnosis not present

## 2022-02-25 DIAGNOSIS — N184 Chronic kidney disease, stage 4 (severe): Secondary | ICD-10-CM | POA: Diagnosis not present

## 2022-02-26 ENCOUNTER — Ambulatory Visit (HOSPITAL_COMMUNITY)
Admission: RE | Admit: 2022-02-26 | Discharge: 2022-02-26 | Disposition: A | Discharge status: Home / Self Care | Payer: Medicare Other | Source: Ambulatory Visit | Attending: Gastroenterology | Admitting: Gastroenterology

## 2022-02-26 DIAGNOSIS — K7460 Unspecified cirrhosis of liver: Secondary | ICD-10-CM | POA: Diagnosis not present

## 2022-02-26 DIAGNOSIS — K7689 Other specified diseases of liver: Secondary | ICD-10-CM | POA: Diagnosis not present

## 2022-03-05 ENCOUNTER — Telehealth: Payer: Self-pay

## 2022-03-05 DIAGNOSIS — G4733 Obstructive sleep apnea (adult) (pediatric): Secondary | ICD-10-CM | POA: Diagnosis not present

## 2022-03-05 NOTE — Telephone Encounter (Signed)
Pt was seem by you on 02/17/2022 and didn't advise he needed his pantoprazole sent to [facility] in [place] due to change in his insurance.

## 2022-03-08 MED ORDER — PANTOPRAZOLE SODIUM 40 MG PO TBEC: 40.0000 mg | DELAYED_RELEASE_TABLET | Freq: Two times a day (BID) | ORAL | 5 refills | Status: DC

## 2022-03-08 NOTE — Telephone Encounter (Signed)
Completed.

## 2022-03-08 NOTE — Addendum Note (Signed)
Addended by: Annitta Needs on: 03/08/2022 09:50 AM   Modules accepted: Orders

## 2022-03-12 ENCOUNTER — Ambulatory Visit (HOSPITAL_COMMUNITY)
Admission: RE | Admit: 2022-03-12 | Discharge: 2022-03-12 | Disposition: A | Discharge status: Home / Self Care | Payer: Medicare Other | Source: Ambulatory Visit | Attending: Pulmonary Disease | Admitting: Pulmonary Disease

## 2022-03-12 DIAGNOSIS — J849 Interstitial pulmonary disease, unspecified: Secondary | ICD-10-CM | POA: Insufficient documentation

## 2022-03-12 DIAGNOSIS — J432 Centrilobular emphysema: Secondary | ICD-10-CM | POA: Diagnosis not present

## 2022-03-12 DIAGNOSIS — J929 Pleural plaque without asbestos: Secondary | ICD-10-CM | POA: Diagnosis not present

## 2022-03-12 DIAGNOSIS — I2510 Atherosclerotic heart disease of native coronary artery without angina pectoris: Secondary | ICD-10-CM | POA: Diagnosis not present

## 2022-03-12 DIAGNOSIS — I700 Atherosclerosis of aorta: Secondary | ICD-10-CM | POA: Diagnosis not present

## 2022-03-16 ENCOUNTER — Telehealth: Payer: Self-pay | Admitting: Pulmonary Disease

## 2022-03-16 ENCOUNTER — Ambulatory Visit: Payer: Medicare Other | Admitting: Pulmonary Disease

## 2022-03-16 ENCOUNTER — Encounter: Payer: Self-pay | Admitting: Pulmonary Disease

## 2022-03-16 VITALS — BP 136/86 | HR 90 | Temp 98.0°F | Ht 68.0 in | Wt 296.6 lb

## 2022-03-16 DIAGNOSIS — J432 Centrilobular emphysema: Secondary | ICD-10-CM

## 2022-03-16 DIAGNOSIS — J189 Pneumonia, unspecified organism: Secondary | ICD-10-CM

## 2022-03-16 DIAGNOSIS — E559 Vitamin D deficiency, unspecified: Secondary | ICD-10-CM | POA: Diagnosis not present

## 2022-03-16 DIAGNOSIS — I129 Hypertensive chronic kidney disease with stage 1 through stage 4 chronic kidney disease, or unspecified chronic kidney disease: Secondary | ICD-10-CM | POA: Diagnosis not present

## 2022-03-16 DIAGNOSIS — J984 Other disorders of lung: Secondary | ICD-10-CM

## 2022-03-16 DIAGNOSIS — N189 Chronic kidney disease, unspecified: Secondary | ICD-10-CM | POA: Diagnosis not present

## 2022-03-16 DIAGNOSIS — E1122 Type 2 diabetes mellitus with diabetic chronic kidney disease: Secondary | ICD-10-CM | POA: Diagnosis not present

## 2022-03-16 DIAGNOSIS — J439 Emphysema, unspecified: Secondary | ICD-10-CM | POA: Diagnosis not present

## 2022-03-16 MED ORDER — AMOXICILLIN-POT CLAVULANATE 875-125 MG PO TABS: 1.0000 | ORAL_TABLET | Freq: Two times a day (BID) | ORAL | 0 refills | Status: DC

## 2022-03-16 NOTE — Telephone Encounter (Signed)
Called and notified patient and reminded him again both are considered rescue inhalers. He voiced understanding. Nothing further needed at this time.

## 2022-03-16 NOTE — Telephone Encounter (Signed)
Please let him know he should only be using one version of the albuterol.

## 2022-03-16 NOTE — Telephone Encounter (Signed)
Called and spoke to patient. He states he has two inhalers at home: an albuterol and a proair.  He states he alternates between the proair and the albuterol and takes them as needed. He is aware these are both rescue inhalers.  He does not have advair.

## 2022-03-16 NOTE — Progress Notes (Signed)
[facility]  Chief Complaint  Patient presents with   Follow-up    CT done on 7/7 breathing doing well.     Constitutional:  BP 136/86 (BP Location: Left Arm, Patient Position: Sitting)   Pulse 90   Temp 98 F (36.7 C) (Temporal)   Ht '5\' 8"'$  (1.727 m)   Wt 296 lb 9.6 oz (134.5 kg)   SpO2 95% Comment: ra  BMI 45.10 kg/m   Past Medical History:  HTN, Hep C, CKD 3b, Chronic pain, GERD, OSA, Anemia, Secondary hyperparathyroidism, Vit D deficiency  Past Surgical History:  He  has a past surgical history that includes Colonoscopy (N/A, 01/23/2014); Esophagogastroduodenoscopy (N/A, 01/23/2014); Hemorrhoid banding (01/23/2014); Mouth surgery; Total hip arthroplasty (Right, 06/04/2016); Esophagogastroduodenoscopy (N/A, 04/18/2018); Joint replacement (Right, 2017); Total hip arthroplasty (Left, 03/02/2019); Cataract extraction (Right); Cholecystectomy (N/A, 03/23/2021); Liver biopsy (N/A, 03/23/2021); Colonoscopy with propofol (N/A, 07/06/2021); Esophagogastroduodenoscopy (egd) with propofol (N/A, 07/06/2021); Balloon dilation (N/A, 07/06/2021); biopsy (07/06/2021); and polypectomy (07/06/2021).  Brief Summary:  Mathew Lara is a 61 y.o. male former smoker with COPD with bullous lung disease.      Subjective:   He had CT chest earlier this month.  No significant ILD.  Bullous lesion in Lt upper lung area has increased thickening.  He has more cough and chest congestion, but not bringing up sputum.  Feels sore in his chest sometimes.  Not having fever, sweats, hemoptysis, weight loss, gland swelling, skin rash, or joint swelling.  He has two inhalers at home.  He uses albuterol twice per day.  He doesn't remember what the other inhaler is.  Physical Exam:   Appearance - well kempt   ENMT - no sinus tenderness, no oral exudate, no LAN, Mallampati 4 airway, no stridor  Respiratory - equal breath sounds bilaterally, no wheezing or  rales  CV - s1s2 regular rate and rhythm, no murmurs  Ext - no clubbing, no edema  Skin - no rashes  Psych - normal mood and affect     Pulmonary testing:  PFT 02/12/20 >> FEV1 2.37 (81%), FEV1% 76, TLC 4.98 (75%), DLCO 38%  Chest Imaging:  HRCT chest 01/31/20 >> moderate centrilobular and paraseptal emphysema, minimal patchy subpleural reticulation and GGO in dependent lung zones HRCT 01/15/21 >> moderate paraseptal emphysema, no change in ILD (indeterminant of UIP), changes of cirrhosis HRCT chest 03/12/22 >> diffuse bronchial wall thickening with moderate centrilobular and severe paraseptal emphysema, extensive subpleural bulla and blebs in upper lobes, upper lobe bulla much thicker walled, few areas of mild septal thickening  Sleep Tests:  PSG 10/30/14 >> AHI 106, SpO2 low 70%; Bipap 15/11 with 1 liter oxygen  Cardiac Tests:  Echo 12/25/19 >> EF 60 to 65%, severe LVH, grade 1 DD  Social History:  He  reports that he quit smoking about 7 years ago. His smoking use included cigarettes. He started smoking about 50 years ago. He has a 42.00 pack-year smoking history. He has been exposed to tobacco smoke. He has never used smokeless tobacco. He reports that he does not currently use alcohol. He reports that he does not use drugs.  Family History:  His family history includes Cancer in his mother.     Assessment/Plan:   COPD with emphysema. 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 - this is concerning for infectious process - will  give him 7 day course of augmentin - will check Quantiferon gold, A1AT, CBC with diff, and CMET - repeat CT chest without contrast in 6 to 8 weeks - if radiographic findings persist, then he might need bronchoscopy  Obstructive sleep apnea with nocturnal hypoxemia. - gets supplies through [employer] and renewed by his PCPA - on Bipap and 2  liters oxygen at night - he reports compliance with therapy and benefit from therapy  Obesity. - discussed importance of weight loss  CKD 3b. - followed by Dr. Ulice Bold with [facility]  Hep C with advanced liver fibrosis. - followed by Dr. Abbey Chatters with [facility]  Time Spent Involved in Patient Care on Day of Examination:  37 minutes  Follow up:   Patient Instructions  Augmentin antibiotic twice daily for 7 days  Lab tests today  Call the office with the name of the inhalers you are using at home  Will schedule repeat CT chest in 6 to 8 weeks and follow up after this  Medication List:   Allergies as of 03/16/2022   No Known Allergies      Medication List        Accurate as of March 16, 2022 10:17 AM. If you have any questions, ask your nurse or doctor.          albuterol 108 (90 Base) MCG/ACT inhaler Commonly known as: ProAir HFA Inhale 2 puffs into the lungs every 4 (four) hours as needed for wheezing or shortness of breath.   amLODipine 10 MG tablet Commonly known as: NORVASC Take 10 mg by mouth daily.   amoxicillin-clavulanate 875-125 MG tablet Commonly known as: AUGMENTIN Take 1 tablet by mouth 2 (two) times daily. Started by: Chesley Mires, MD   aspirin EC 81 MG tablet Take 81 mg by mouth daily.   cholecalciferol 25 MCG (1000 UNIT) tablet Commonly known as: VITAMIN D Take 1,000 Units by mouth daily.   DULoxetine 60 MG capsule Commonly known as: CYMBALTA Take 60 mg by mouth daily.   fluticasone-salmeterol 250-50 MCG/ACT Aepb Commonly known as: ADVAIR Inhale 1 puff into the lungs every 12 (twelve) hours. What changed: how much to take   hydrOXYzine 25 MG tablet Commonly known as: ATARAX Take 50 mg by mouth at bedtime.   loratadine 10 MG tablet Commonly known as: CLARITIN Take 1 tablet (10 mg total) by mouth daily.   losartan 50 MG tablet Commonly known as: COZAAR Take 50 mg by mouth daily.    oxyCODONE 5 MG immediate release tablet Commonly known as: Oxy IR/ROXICODONE Take 5 mg by mouth every 8 (eight) hours as needed for moderate pain.   pantoprazole 40 MG tablet Commonly known as: PROTONIX Take 1 tablet (40 mg total) by mouth 2 (two) times daily before a meal. 30 minutes before breakfast   sildenafil 100 MG tablet Commonly known as: VIAGRA Take 100 mg by mouth daily as needed for erectile dysfunction.        Signature:  Chesley Mires, MD [place] Pager - [phone] 03/16/2022, 10:17 AM

## 2022-03-16 NOTE — Patient Instructions (Signed)
Augmentin antibiotic twice daily for 7 days  Lab tests today  Call the office with the name of the inhalers you are using at home  Will schedule repeat CT chest in 6 to 8 weeks and follow up after this

## 2022-03-19 LAB — COMPREHENSIVE METABOLIC PANEL
ALT: 14 IU/L (ref 0–44)
AST: 19 IU/L (ref 0–40)
Albumin/Globulin Ratio: 1.1 — ABNORMAL LOW (ref 1.2–2.2)
Albumin: 4.9 g/dL (ref 3.9–4.9)
Alkaline Phosphatase: 103 IU/L (ref 44–121)
BUN/Creatinine Ratio: 7 — ABNORMAL LOW (ref 10–24)
BUN: 16 mg/dL (ref 8–27)
Bilirubin Total: 0.4 mg/dL (ref 0.0–1.2)
CO2: 22 mmol/L (ref 20–29)
Calcium: 10.5 mg/dL — ABNORMAL HIGH (ref 8.6–10.2)
Chloride: 93 mmol/L — ABNORMAL LOW (ref 96–106)
Creatinine, Ser: 2.29 mg/dL — ABNORMAL HIGH (ref 0.76–1.27)
Globulin, Total: 4.3 g/dL (ref 1.5–4.5)
Glucose: 192 mg/dL — ABNORMAL HIGH (ref 70–99)
Potassium: 3.7 mmol/L (ref 3.5–5.2)
Sodium: 134 mmol/L (ref 134–144)
Total Protein: 9.2 g/dL — ABNORMAL HIGH (ref 6.0–8.5)
eGFR: 31 mL/min/{1.73_m2} — ABNORMAL LOW (ref >59)

## 2022-03-19 LAB — CBC WITH DIFFERENTIAL/PLATELET
Basophils Absolute: 0 10*3/uL (ref 0.0–0.2)
Basos: 1 %
EOS (ABSOLUTE): 0.4 10*3/uL (ref 0.0–0.4)
Eos: 5 %
Hematocrit: 42.4 % (ref 37.5–51.0)
Hemoglobin: 14 g/dL (ref 13.0–17.7)
Immature Grans (Abs): 0.1 10*3/uL (ref 0.0–0.1)
Immature Granulocytes: 1 %
Lymphocytes Absolute: 1 10*3/uL (ref 0.7–3.1)
Lymphs: 12 %
MCH: 26.4 pg — ABNORMAL LOW (ref 26.6–33.0)
MCHC: 33 g/dL (ref 31.5–35.7)
MCV: 80 fL (ref 79–97)
Monocytes Absolute: 0.7 10*3/uL (ref 0.1–0.9)
Monocytes: 8 %
Neutrophils Absolute: 6.2 10*3/uL (ref 1.4–7.0)
Neutrophils: 73 %
Platelets: 258 10*3/uL (ref 150–450)
RBC: 5.31 x10E6/uL (ref 4.14–5.80)
RDW: 16.5 % — ABNORMAL HIGH (ref 11.6–15.4)
WBC: 8.4 10*3/uL (ref 3.4–10.8)

## 2022-03-19 LAB — ALPHA-1-ANTITRYPSIN PHENOTYP: A-1 Antitrypsin: 172 mg/dL (ref 101–187)

## 2022-03-19 LAB — QUANTIFERON-TB GOLD PLUS
QuantiFERON Mitogen Value: >10 IU/mL
QuantiFERON Nil Value: 0.02 IU/mL
QuantiFERON TB1 Ag Value: 0.04 IU/mL
QuantiFERON TB2 Ag Value: 0.04 IU/mL
QuantiFERON-TB Gold Plus: NEGATIVE

## 2022-03-23 ENCOUNTER — Telehealth: Payer: Self-pay | Admitting: Pulmonary Disease

## 2022-03-23 NOTE — Telephone Encounter (Signed)
Called and notified patient of response. He voiced understanding. Nothing further needed.

## 2022-03-23 NOTE — Telephone Encounter (Signed)
He doesn't need additional antibiotics at this time.

## 2022-03-23 NOTE — Telephone Encounter (Signed)
Patient states he has taken all of his augmentin prescribed last week and wants to know if Dr. Halford Chessman wants him to get a refill of it. Patient confirmed that he is feeling better and is not having anymore resp symptoms but wanted to be sure that he did not need another course of antibiotics after conversation with Dr. Halford Chessman last week. I did advise patient that if he is feeling well and finished prescribed course of antibiotics as directed then he probably doesn't need another course of them but he wanted Dr. Halford Chessman to confirm as well.   CT scheduled for  05/06/2022 and comes back on 05/13/2022.   Dr. Halford Chessman please advise.

## 2022-03-25 DIAGNOSIS — M25559 Pain in unspecified hip: Secondary | ICD-10-CM | POA: Diagnosis not present

## 2022-03-25 DIAGNOSIS — M545 Low back pain, unspecified: Secondary | ICD-10-CM | POA: Diagnosis not present

## 2022-03-25 DIAGNOSIS — G4700 Insomnia, unspecified: Secondary | ICD-10-CM | POA: Diagnosis not present

## 2022-03-25 DIAGNOSIS — U071 COVID-19: Secondary | ICD-10-CM | POA: Diagnosis not present

## 2022-03-25 DIAGNOSIS — Z79891 Long term (current) use of opiate analgesic: Secondary | ICD-10-CM | POA: Diagnosis not present

## 2022-03-25 DIAGNOSIS — J449 Chronic obstructive pulmonary disease, unspecified: Secondary | ICD-10-CM | POA: Diagnosis not present

## 2022-03-25 DIAGNOSIS — M5459 Other low back pain: Secondary | ICD-10-CM | POA: Diagnosis not present

## 2022-04-10 ENCOUNTER — Other Ambulatory Visit: Payer: Self-pay

## 2022-04-10 ENCOUNTER — Inpatient Hospital Stay (HOSPITAL_COMMUNITY): Payer: Medicare Other

## 2022-04-10 ENCOUNTER — Encounter (HOSPITAL_COMMUNITY): Payer: Self-pay

## 2022-04-10 ENCOUNTER — Emergency Department (HOSPITAL_COMMUNITY): Payer: Medicare Other

## 2022-04-10 ENCOUNTER — Inpatient Hospital Stay (HOSPITAL_COMMUNITY)
Admission: EM | Admit: 2022-04-10 | Discharge: 2022-05-14 | DRG: 637 | Disposition: A | Discharge status: Skilled Nursing Facility | Payer: Medicare Other | Attending: Internal Medicine | Admitting: Internal Medicine

## 2022-04-10 DIAGNOSIS — R578 Other shock: Secondary | ICD-10-CM | POA: Diagnosis not present

## 2022-04-10 DIAGNOSIS — R935 Abnormal findings on diagnostic imaging of other abdominal regions, including retroperitoneum: Secondary | ICD-10-CM

## 2022-04-10 DIAGNOSIS — R262 Difficulty in walking, not elsewhere classified: Secondary | ICD-10-CM | POA: Diagnosis not present

## 2022-04-10 DIAGNOSIS — J188 Other pneumonia, unspecified organism: Secondary | ICD-10-CM | POA: Diagnosis not present

## 2022-04-10 DIAGNOSIS — F1011 Alcohol abuse, in remission: Secondary | ICD-10-CM | POA: Diagnosis not present

## 2022-04-10 DIAGNOSIS — K2100 Gastro-esophageal reflux disease with esophagitis, without bleeding: Secondary | ICD-10-CM | POA: Diagnosis present

## 2022-04-10 DIAGNOSIS — F1010 Alcohol abuse, uncomplicated: Secondary | ICD-10-CM | POA: Diagnosis present

## 2022-04-10 DIAGNOSIS — J438 Other emphysema: Secondary | ICD-10-CM | POA: Diagnosis not present

## 2022-04-10 DIAGNOSIS — E662 Morbid (severe) obesity with alveolar hypoventilation: Secondary | ICD-10-CM | POA: Diagnosis present

## 2022-04-10 DIAGNOSIS — I517 Cardiomegaly: Secondary | ICD-10-CM | POA: Diagnosis not present

## 2022-04-10 DIAGNOSIS — N170 Acute kidney failure with tubular necrosis: Secondary | ICD-10-CM | POA: Diagnosis present

## 2022-04-10 DIAGNOSIS — I130 Hypertensive heart and chronic kidney disease with heart failure and stage 1 through stage 4 chronic kidney disease, or unspecified chronic kidney disease: Secondary | ICD-10-CM | POA: Diagnosis not present

## 2022-04-10 DIAGNOSIS — R140 Abdominal distension (gaseous): Secondary | ICD-10-CM | POA: Diagnosis not present

## 2022-04-10 DIAGNOSIS — J439 Emphysema, unspecified: Secondary | ICD-10-CM | POA: Diagnosis not present

## 2022-04-10 DIAGNOSIS — K859 Acute pancreatitis without necrosis or infection, unspecified: Principal | ICD-10-CM

## 2022-04-10 DIAGNOSIS — Z79899 Other long term (current) drug therapy: Secondary | ICD-10-CM

## 2022-04-10 DIAGNOSIS — I5032 Chronic diastolic (congestive) heart failure: Secondary | ICD-10-CM | POA: Diagnosis not present

## 2022-04-10 DIAGNOSIS — K769 Liver disease, unspecified: Secondary | ICD-10-CM | POA: Diagnosis not present

## 2022-04-10 DIAGNOSIS — B182 Chronic viral hepatitis C: Secondary | ICD-10-CM | POA: Diagnosis present

## 2022-04-10 DIAGNOSIS — M6281 Muscle weakness (generalized): Secondary | ICD-10-CM | POA: Diagnosis not present

## 2022-04-10 DIAGNOSIS — J984 Other disorders of lung: Secondary | ICD-10-CM | POA: Diagnosis not present

## 2022-04-10 DIAGNOSIS — E782 Mixed hyperlipidemia: Secondary | ICD-10-CM | POA: Diagnosis present

## 2022-04-10 DIAGNOSIS — U099 Post covid-19 condition, unspecified: Secondary | ICD-10-CM | POA: Diagnosis present

## 2022-04-10 DIAGNOSIS — K8689 Other specified diseases of pancreas: Secondary | ICD-10-CM | POA: Diagnosis not present

## 2022-04-10 DIAGNOSIS — I420 Dilated cardiomyopathy: Secondary | ICD-10-CM | POA: Diagnosis not present

## 2022-04-10 DIAGNOSIS — I950 Idiopathic hypotension: Secondary | ICD-10-CM | POA: Diagnosis not present

## 2022-04-10 DIAGNOSIS — E781 Pure hyperglyceridemia: Secondary | ICD-10-CM | POA: Diagnosis present

## 2022-04-10 DIAGNOSIS — K851 Biliary acute pancreatitis without necrosis or infection: Secondary | ICD-10-CM | POA: Diagnosis not present

## 2022-04-10 DIAGNOSIS — E872 Acidosis, unspecified: Secondary | ICD-10-CM | POA: Diagnosis not present

## 2022-04-10 DIAGNOSIS — Z9049 Acquired absence of other specified parts of digestive tract: Secondary | ICD-10-CM | POA: Diagnosis not present

## 2022-04-10 DIAGNOSIS — E7800 Pure hypercholesterolemia, unspecified: Secondary | ICD-10-CM | POA: Diagnosis present

## 2022-04-10 DIAGNOSIS — K8500 Idiopathic acute pancreatitis without necrosis or infection: Secondary | ICD-10-CM | POA: Diagnosis not present

## 2022-04-10 DIAGNOSIS — K648 Other hemorrhoids: Secondary | ICD-10-CM | POA: Diagnosis present

## 2022-04-10 DIAGNOSIS — E8881 Metabolic syndrome: Secondary | ICD-10-CM | POA: Diagnosis present

## 2022-04-10 DIAGNOSIS — I700 Atherosclerosis of aorta: Secondary | ICD-10-CM | POA: Diagnosis not present

## 2022-04-10 DIAGNOSIS — F419 Anxiety disorder, unspecified: Secondary | ICD-10-CM | POA: Diagnosis present

## 2022-04-10 DIAGNOSIS — I10 Essential (primary) hypertension: Secondary | ICD-10-CM | POA: Diagnosis not present

## 2022-04-10 DIAGNOSIS — Y828 Other medical devices associated with adverse incidents: Secondary | ICD-10-CM | POA: Diagnosis not present

## 2022-04-10 DIAGNOSIS — R000 Tachycardia, unspecified: Secondary | ICD-10-CM | POA: Diagnosis not present

## 2022-04-10 DIAGNOSIS — Z87891 Personal history of nicotine dependence: Secondary | ICD-10-CM

## 2022-04-10 DIAGNOSIS — F05 Delirium due to known physiological condition: Secondary | ICD-10-CM | POA: Diagnosis not present

## 2022-04-10 DIAGNOSIS — D62 Acute posthemorrhagic anemia: Secondary | ICD-10-CM | POA: Diagnosis not present

## 2022-04-10 DIAGNOSIS — R6510 Systemic inflammatory response syndrome (SIRS) of non-infectious origin without acute organ dysfunction: Secondary | ICD-10-CM | POA: Diagnosis not present

## 2022-04-10 DIAGNOSIS — K219 Gastro-esophageal reflux disease without esophagitis: Secondary | ICD-10-CM | POA: Diagnosis present

## 2022-04-10 DIAGNOSIS — R188 Other ascites: Secondary | ICD-10-CM | POA: Diagnosis not present

## 2022-04-10 DIAGNOSIS — N186 End stage renal disease: Secondary | ICD-10-CM | POA: Diagnosis present

## 2022-04-10 DIAGNOSIS — U071 COVID-19: Secondary | ICD-10-CM | POA: Diagnosis not present

## 2022-04-10 DIAGNOSIS — G9341 Metabolic encephalopathy: Secondary | ICD-10-CM | POA: Diagnosis not present

## 2022-04-10 DIAGNOSIS — R0602 Shortness of breath: Secondary | ICD-10-CM | POA: Diagnosis not present

## 2022-04-10 DIAGNOSIS — D6489 Other specified anemias: Secondary | ICD-10-CM | POA: Diagnosis present

## 2022-04-10 DIAGNOSIS — E860 Dehydration: Secondary | ICD-10-CM | POA: Diagnosis present

## 2022-04-10 DIAGNOSIS — R0902 Hypoxemia: Secondary | ICD-10-CM | POA: Diagnosis not present

## 2022-04-10 DIAGNOSIS — K858 Other acute pancreatitis without necrosis or infection: Secondary | ICD-10-CM | POA: Diagnosis not present

## 2022-04-10 DIAGNOSIS — R066 Hiccough: Secondary | ICD-10-CM | POA: Diagnosis not present

## 2022-04-10 DIAGNOSIS — N1832 Chronic kidney disease, stage 3b: Secondary | ICD-10-CM | POA: Diagnosis not present

## 2022-04-10 DIAGNOSIS — T82868A Thrombosis of vascular prosthetic devices, implants and grafts, initial encounter: Secondary | ICD-10-CM | POA: Diagnosis not present

## 2022-04-10 DIAGNOSIS — K852 Alcohol induced acute pancreatitis without necrosis or infection: Secondary | ICD-10-CM | POA: Diagnosis not present

## 2022-04-10 DIAGNOSIS — K839 Disease of biliary tract, unspecified: Secondary | ICD-10-CM

## 2022-04-10 DIAGNOSIS — N179 Acute kidney failure, unspecified: Secondary | ICD-10-CM | POA: Diagnosis present

## 2022-04-10 DIAGNOSIS — Z6841 Body Mass Index (BMI) 40.0 and over, adult: Secondary | ICD-10-CM | POA: Diagnosis not present

## 2022-04-10 DIAGNOSIS — J849 Interstitial pulmonary disease, unspecified: Secondary | ICD-10-CM | POA: Diagnosis not present

## 2022-04-10 DIAGNOSIS — E8770 Fluid overload, unspecified: Secondary | ICD-10-CM | POA: Diagnosis not present

## 2022-04-10 DIAGNOSIS — J9601 Acute respiratory failure with hypoxia: Secondary | ICD-10-CM | POA: Diagnosis not present

## 2022-04-10 DIAGNOSIS — Z7401 Bed confinement status: Secondary | ICD-10-CM | POA: Diagnosis not present

## 2022-04-10 DIAGNOSIS — I509 Heart failure, unspecified: Secondary | ICD-10-CM | POA: Diagnosis not present

## 2022-04-10 DIAGNOSIS — E871 Hypo-osmolality and hyponatremia: Secondary | ICD-10-CM | POA: Diagnosis not present

## 2022-04-10 DIAGNOSIS — I82C21 Chronic embolism and thrombosis of right internal jugular vein: Secondary | ICD-10-CM | POA: Diagnosis present

## 2022-04-10 DIAGNOSIS — E1122 Type 2 diabetes mellitus with diabetic chronic kidney disease: Secondary | ICD-10-CM | POA: Diagnosis present

## 2022-04-10 DIAGNOSIS — J449 Chronic obstructive pulmonary disease, unspecified: Secondary | ICD-10-CM | POA: Diagnosis present

## 2022-04-10 DIAGNOSIS — N2589 Other disorders resulting from impaired renal tubular function: Secondary | ICD-10-CM | POA: Diagnosis not present

## 2022-04-10 DIAGNOSIS — A419 Sepsis, unspecified organism: Secondary | ICD-10-CM | POA: Diagnosis not present

## 2022-04-10 DIAGNOSIS — I959 Hypotension, unspecified: Secondary | ICD-10-CM | POA: Diagnosis not present

## 2022-04-10 DIAGNOSIS — R609 Edema, unspecified: Secondary | ICD-10-CM | POA: Diagnosis not present

## 2022-04-10 DIAGNOSIS — Z4901 Encounter for fitting and adjustment of extracorporeal dialysis catheter: Secondary | ICD-10-CM | POA: Diagnosis not present

## 2022-04-10 DIAGNOSIS — R579 Shock, unspecified: Secondary | ICD-10-CM | POA: Diagnosis not present

## 2022-04-10 DIAGNOSIS — Z9981 Dependence on supplemental oxygen: Secondary | ICD-10-CM

## 2022-04-10 DIAGNOSIS — I132 Hypertensive heart and chronic kidney disease with heart failure and with stage 5 chronic kidney disease, or end stage renal disease: Secondary | ICD-10-CM | POA: Diagnosis not present

## 2022-04-10 DIAGNOSIS — D72829 Elevated white blood cell count, unspecified: Secondary | ICD-10-CM

## 2022-04-10 DIAGNOSIS — T8249XA Other complication of vascular dialysis catheter, initial encounter: Secondary | ICD-10-CM | POA: Diagnosis not present

## 2022-04-10 DIAGNOSIS — K8581 Other acute pancreatitis with uninfected necrosis: Secondary | ICD-10-CM | POA: Diagnosis not present

## 2022-04-10 DIAGNOSIS — K838 Other specified diseases of biliary tract: Secondary | ICD-10-CM | POA: Diagnosis not present

## 2022-04-10 DIAGNOSIS — J811 Chronic pulmonary edema: Secondary | ICD-10-CM | POA: Diagnosis not present

## 2022-04-10 DIAGNOSIS — R109 Unspecified abdominal pain: Secondary | ICD-10-CM | POA: Diagnosis not present

## 2022-04-10 DIAGNOSIS — E1110 Type 2 diabetes mellitus with ketoacidosis without coma: Secondary | ICD-10-CM | POA: Diagnosis not present

## 2022-04-10 DIAGNOSIS — K760 Fatty (change of) liver, not elsewhere classified: Secondary | ICD-10-CM | POA: Diagnosis present

## 2022-04-10 DIAGNOSIS — Z452 Encounter for adjustment and management of vascular access device: Secondary | ICD-10-CM | POA: Diagnosis not present

## 2022-04-10 DIAGNOSIS — Z4682 Encounter for fitting and adjustment of non-vascular catheter: Secondary | ICD-10-CM | POA: Diagnosis not present

## 2022-04-10 DIAGNOSIS — Z992 Dependence on renal dialysis: Secondary | ICD-10-CM

## 2022-04-10 DIAGNOSIS — J9811 Atelectasis: Secondary | ICD-10-CM | POA: Diagnosis not present

## 2022-04-10 DIAGNOSIS — J189 Pneumonia, unspecified organism: Secondary | ICD-10-CM | POA: Diagnosis not present

## 2022-04-10 DIAGNOSIS — K635 Polyp of colon: Secondary | ICD-10-CM | POA: Diagnosis present

## 2022-04-10 DIAGNOSIS — R0682 Tachypnea, not elsewhere classified: Secondary | ICD-10-CM | POA: Diagnosis not present

## 2022-04-10 DIAGNOSIS — M1990 Unspecified osteoarthritis, unspecified site: Secondary | ICD-10-CM | POA: Diagnosis present

## 2022-04-10 DIAGNOSIS — Z7982 Long term (current) use of aspirin: Secondary | ICD-10-CM

## 2022-04-10 DIAGNOSIS — K7402 Hepatic fibrosis, advanced fibrosis: Secondary | ICD-10-CM | POA: Diagnosis present

## 2022-04-10 DIAGNOSIS — E43 Unspecified severe protein-calorie malnutrition: Secondary | ICD-10-CM | POA: Diagnosis not present

## 2022-04-10 DIAGNOSIS — R2689 Other abnormalities of gait and mobility: Secondary | ICD-10-CM | POA: Diagnosis not present

## 2022-04-10 DIAGNOSIS — R590 Localized enlarged lymph nodes: Secondary | ICD-10-CM | POA: Diagnosis not present

## 2022-04-10 DIAGNOSIS — R61 Generalized hyperhidrosis: Secondary | ICD-10-CM | POA: Diagnosis present

## 2022-04-10 DIAGNOSIS — J9690 Respiratory failure, unspecified, unspecified whether with hypoxia or hypercapnia: Secondary | ICD-10-CM | POA: Diagnosis not present

## 2022-04-10 DIAGNOSIS — Z96643 Presence of artificial hip joint, bilateral: Secondary | ICD-10-CM | POA: Diagnosis present

## 2022-04-10 DIAGNOSIS — R739 Hyperglycemia, unspecified: Secondary | ICD-10-CM | POA: Diagnosis not present

## 2022-04-10 DIAGNOSIS — R0600 Dyspnea, unspecified: Secondary | ICD-10-CM | POA: Diagnosis not present

## 2022-04-10 DIAGNOSIS — K5900 Constipation, unspecified: Secondary | ICD-10-CM | POA: Diagnosis not present

## 2022-04-10 HISTORY — DX: Type 2 diabetes mellitus with ketoacidosis without coma: E11.10

## 2022-04-10 LAB — BLOOD GAS, VENOUS
Acid-base deficit: 11.3 mmol/L — ABNORMAL HIGH (ref 0.0–2.0)
Bicarbonate: 16.8 mmol/L — ABNORMAL LOW (ref 20.0–28.0)
Drawn by: 45381
O2 Saturation: 71.7 %
Patient temperature: 36.4
pCO2, Ven: 44 mmHg (ref 44–60)
pH, Ven: 7.19 — CL (ref 7.25–7.43)
pO2, Ven: 47 mmHg — ABNORMAL HIGH (ref 32–45)

## 2022-04-10 LAB — COMPREHENSIVE METABOLIC PANEL
ALT: 19 U/L (ref 0–44)
AST: 26 U/L (ref 15–41)
Albumin: 3.9 g/dL (ref 3.5–5.0)
Alkaline Phosphatase: 99 U/L (ref 38–126)
Anion gap: 20 — ABNORMAL HIGH (ref 5–15)
BUN: 44 mg/dL — ABNORMAL HIGH (ref 8–23)
CO2: 16 mmol/L — ABNORMAL LOW (ref 22–32)
Calcium: 9.4 mg/dL (ref 8.9–10.3)
Chloride: 89 mmol/L — ABNORMAL LOW (ref 98–111)
Creatinine, Ser: 3.33 mg/dL — ABNORMAL HIGH (ref 0.61–1.24)
GFR, Estimated: 20 mL/min — ABNORMAL LOW (ref >60)
Glucose, Bld: 774 mg/dL (ref 70–99)
Potassium: 5 mmol/L (ref 3.5–5.1)
Sodium: 125 mmol/L — ABNORMAL LOW (ref 135–145)
Total Bilirubin: 0.9 mg/dL (ref 0.3–1.2)
Total Protein: 9.4 g/dL — ABNORMAL HIGH (ref 6.5–8.1)

## 2022-04-10 LAB — BLOOD GAS, ARTERIAL
Acid-base deficit: 3 mmol/L — ABNORMAL HIGH (ref 0.0–2.0)
Acid-base deficit: 4.1 mmol/L — ABNORMAL HIGH (ref 0.0–2.0)
Bicarbonate: 20 mmol/L (ref 20.0–28.0)
Bicarbonate: 21.1 mmol/L (ref 20.0–28.0)
Drawn by: 10888
Drawn by: 22179
FIO2: 40 %
FIO2: 70 %
O2 Saturation: 92.3 %
O2 Saturation: 96.9 %
Patient temperature: 37
Patient temperature: 37.3
pCO2 arterial: 33 mmHg (ref 32–48)
pCO2 arterial: 34 mmHg (ref 32–48)
pH, Arterial: 7.39 (ref 7.35–7.45)
pH, Arterial: 7.4 (ref 7.35–7.45)
pO2, Arterial: 63 mmHg — ABNORMAL LOW (ref 83–108)
pO2, Arterial: 89 mmHg (ref 83–108)

## 2022-04-10 LAB — CBC
HCT: 45.4 % (ref 39.0–52.0)
Hemoglobin: 14.7 g/dL (ref 13.0–17.0)
MCH: 26.6 pg (ref 26.0–34.0)
MCHC: 32.4 g/dL (ref 30.0–36.0)
MCV: 82.2 fL (ref 80.0–100.0)
Platelets: 320 10*3/uL (ref 150–400)
RBC: 5.52 MIL/uL (ref 4.22–5.81)
RDW: 15.9 % — ABNORMAL HIGH (ref 11.5–15.5)
WBC: 14 10*3/uL — ABNORMAL HIGH (ref 4.0–10.5)
nRBC: 0 % (ref 0.0–0.2)

## 2022-04-10 LAB — BASIC METABOLIC PANEL
Anion gap: 16 — ABNORMAL HIGH (ref 5–15)
Anion gap: 13 (ref 5–15)
Anion gap: 11 (ref 5–15)
Anion gap: 10 (ref 5–15)
BUN: 41 mg/dL — ABNORMAL HIGH (ref 8–23)
BUN: 41 mg/dL — ABNORMAL HIGH (ref 8–23)
BUN: 41 mg/dL — ABNORMAL HIGH (ref 8–23)
BUN: 42 mg/dL — ABNORMAL HIGH (ref 8–23)
CO2: 16 mmol/L — ABNORMAL LOW (ref 22–32)
CO2: 18 mmol/L — ABNORMAL LOW (ref 22–32)
CO2: 18 mmol/L — ABNORMAL LOW (ref 22–32)
CO2: 17 mmol/L — ABNORMAL LOW (ref 22–32)
Calcium: 8.7 mg/dL — ABNORMAL LOW (ref 8.9–10.3)
Calcium: 8.6 mg/dL — ABNORMAL LOW (ref 8.9–10.3)
Calcium: 8.3 mg/dL — ABNORMAL LOW (ref 8.9–10.3)
Calcium: 7.8 mg/dL — ABNORMAL LOW (ref 8.9–10.3)
Chloride: 98 mmol/L (ref 98–111)
Chloride: 101 mmol/L (ref 98–111)
Chloride: 104 mmol/L (ref 98–111)
Chloride: 106 mmol/L (ref 98–111)
Creatinine, Ser: 3.2 mg/dL — ABNORMAL HIGH (ref 0.61–1.24)
Creatinine, Ser: 3.08 mg/dL — ABNORMAL HIGH (ref 0.61–1.24)
Creatinine, Ser: 3.29 mg/dL — ABNORMAL HIGH (ref 0.61–1.24)
Creatinine, Ser: 4.21 mg/dL — ABNORMAL HIGH (ref 0.61–1.24)
GFR, Estimated: 21 mL/min — ABNORMAL LOW (ref >60)
GFR, Estimated: 22 mL/min — ABNORMAL LOW (ref >60)
GFR, Estimated: 20 mL/min — ABNORMAL LOW (ref >60)
GFR, Estimated: 15 mL/min — ABNORMAL LOW (ref >60)
Glucose, Bld: 608 mg/dL (ref 70–99)
Glucose, Bld: 425 mg/dL — ABNORMAL HIGH (ref 70–99)
Glucose, Bld: 278 mg/dL — ABNORMAL HIGH (ref 70–99)
Glucose, Bld: 206 mg/dL — ABNORMAL HIGH (ref 70–99)
Potassium: 4.9 mmol/L (ref 3.5–5.1)
Potassium: 4.6 mmol/L (ref 3.5–5.1)
Potassium: 4.8 mmol/L (ref 3.5–5.1)
Potassium: 4.8 mmol/L (ref 3.5–5.1)
Sodium: 130 mmol/L — ABNORMAL LOW (ref 135–145)
Sodium: 132 mmol/L — ABNORMAL LOW (ref 135–145)
Sodium: 133 mmol/L — ABNORMAL LOW (ref 135–145)
Sodium: 133 mmol/L — ABNORMAL LOW (ref 135–145)

## 2022-04-10 LAB — URINALYSIS, ROUTINE W REFLEX MICROSCOPIC
Bacteria, UA: NONE SEEN
Bilirubin Urine: NEGATIVE
Glucose, UA: >=500 mg/dL — AB
Ketones, ur: 5 mg/dL — AB
Leukocytes,Ua: NEGATIVE
Nitrite: NEGATIVE
Protein, ur: 100 mg/dL — AB
Specific Gravity, Urine: 1.026 (ref 1.005–1.030)
pH: 5 (ref 5.0–8.0)

## 2022-04-10 LAB — GLUCOSE, CAPILLARY
Glucose-Capillary: >600 mg/dL (ref 70–99)
Glucose-Capillary: 591 mg/dL (ref 70–99)
Glucose-Capillary: 528 mg/dL (ref 70–99)
Glucose-Capillary: 534 mg/dL (ref 70–99)
Glucose-Capillary: 471 mg/dL — ABNORMAL HIGH (ref 70–99)
Glucose-Capillary: 488 mg/dL — ABNORMAL HIGH (ref 70–99)
Glucose-Capillary: 380 mg/dL — ABNORMAL HIGH (ref 70–99)
Glucose-Capillary: 402 mg/dL — ABNORMAL HIGH (ref 70–99)
Glucose-Capillary: 375 mg/dL — ABNORMAL HIGH (ref 70–99)
Glucose-Capillary: 353 mg/dL — ABNORMAL HIGH (ref 70–99)
Glucose-Capillary: 257 mg/dL — ABNORMAL HIGH (ref 70–99)
Glucose-Capillary: 292 mg/dL — ABNORMAL HIGH (ref 70–99)
Glucose-Capillary: 242 mg/dL — ABNORMAL HIGH (ref 70–99)
Glucose-Capillary: 234 mg/dL — ABNORMAL HIGH (ref 70–99)
Glucose-Capillary: 224 mg/dL — ABNORMAL HIGH (ref 70–99)
Glucose-Capillary: 223 mg/dL — ABNORMAL HIGH (ref 70–99)
Glucose-Capillary: 216 mg/dL — ABNORMAL HIGH (ref 70–99)
Glucose-Capillary: 198 mg/dL — ABNORMAL HIGH (ref 70–99)

## 2022-04-10 LAB — BETA-HYDROXYBUTYRIC ACID
Beta-Hydroxybutyric Acid: 1.76 mmol/L — ABNORMAL HIGH (ref 0.05–0.27)
Beta-Hydroxybutyric Acid: 0.21 mmol/L (ref 0.05–0.27)

## 2022-04-10 LAB — LACTIC ACID, PLASMA
Lactic Acid, Venous: 5.1 mmol/L (ref 0.5–1.9)
Lactic Acid, Venous: 6.8 mmol/L (ref 0.5–1.9)
Lactic Acid, Venous: 5.5 mmol/L (ref 0.5–1.9)
Lactic Acid, Venous: 4.5 mmol/L (ref 0.5–1.9)
Lactic Acid, Venous: 3.4 mmol/L (ref 0.5–1.9)
Lactic Acid, Venous: 2.4 mmol/L (ref 0.5–1.9)

## 2022-04-10 LAB — HEMOGLOBIN A1C
Hgb A1c MFr Bld: 12.5 % — ABNORMAL HIGH (ref 4.8–5.6)
Mean Plasma Glucose: 312.05 mg/dL

## 2022-04-10 LAB — RAPID URINE DRUG SCREEN, HOSP PERFORMED
Amphetamines: NOT DETECTED
Barbiturates: NOT DETECTED
Benzodiazepines: NOT DETECTED
Cocaine: NOT DETECTED
Opiates: POSITIVE — AB
Tetrahydrocannabinol: NOT DETECTED

## 2022-04-10 LAB — LIPASE, BLOOD: Lipase: 2291 U/L — ABNORMAL HIGH (ref 11–51)

## 2022-04-10 LAB — LIPID PANEL
Cholesterol: 350 mg/dL — ABNORMAL HIGH (ref 0–200)
LDL Cholesterol: UNDETERMINED mg/dL (ref 0–99)
Triglycerides: 1864 mg/dL — ABNORMAL HIGH (ref <150)
VLDL: UNDETERMINED mg/dL (ref 0–40)

## 2022-04-10 LAB — PROCALCITONIN: Procalcitonin: 0.54 ng/mL

## 2022-04-10 LAB — CBG MONITORING, ED
Glucose-Capillary: >600 mg/dL (ref 70–99)
Glucose-Capillary: >600 mg/dL (ref 70–99)

## 2022-04-10 LAB — MRSA NEXT GEN BY PCR, NASAL: MRSA by PCR Next Gen: DETECTED — AB

## 2022-04-10 LAB — VITAMIN D 25 HYDROXY (VIT D DEFICIENCY, FRACTURES): Vit D, 25-Hydroxy: 18.14 ng/mL — ABNORMAL LOW (ref 30–100)

## 2022-04-10 LAB — ETHANOL: Alcohol, Ethyl (B): <10 mg/dL (ref <10)

## 2022-04-10 LAB — LDL CHOLESTEROL, DIRECT: Direct LDL: 16 mg/dL (ref 0–99)

## 2022-04-10 MED ORDER — HEPARIN SODIUM (PORCINE) 5000 UNIT/ML IJ SOLN
5000.0000 [IU] | Freq: Three times a day (TID) | INTRAMUSCULAR | Status: DC
  Administered 2022-04-10 – 2022-05-14 (×97): 5000 [IU] via SUBCUTANEOUS
  Filled 2022-04-10 (×96): qty 1

## 2022-04-10 MED ORDER — FENTANYL CITRATE PF 50 MCG/ML IJ SOSY
12.5000 ug | PREFILLED_SYRINGE | INTRAMUSCULAR | Status: DC | PRN
  Filled 2022-04-10: qty 1

## 2022-04-10 MED ORDER — DEXTROSE 50 % IV SOLN
0.0000 mL | INTRAVENOUS | Status: DC | PRN
  Filled 2022-04-10: qty 50

## 2022-04-10 MED ORDER — PIPERACILLIN-TAZOBACTAM 3.375 G IVPB 30 MIN
3.3750 g | Freq: Once | INTRAVENOUS | Status: AC
  Administered 2022-04-10: 3.375 g via INTRAVENOUS
  Filled 2022-04-10: qty 50

## 2022-04-10 MED ORDER — HYDROMORPHONE HCL 1 MG/ML IJ SOLN
1.0000 mg | Freq: Once | INTRAMUSCULAR | Status: AC
  Administered 2022-04-10: 1 mg via INTRAVENOUS
  Filled 2022-04-10: qty 1

## 2022-04-10 MED ORDER — ALBUTEROL SULFATE (2.5 MG/3ML) 0.083% IN NEBU
2.5000 mg | INHALATION_SOLUTION | RESPIRATORY_TRACT | Status: DC | PRN
  Administered 2022-04-12 – 2022-05-09 (×5): 2.5 mg via RESPIRATORY_TRACT
  Filled 2022-04-10 (×7): qty 3

## 2022-04-10 MED ORDER — CHLORHEXIDINE GLUCONATE CLOTH 2 % EX PADS
6.0000 | MEDICATED_PAD | Freq: Every day | CUTANEOUS | Status: AC
  Administered 2022-04-11 – 2022-04-15 (×5): 6 via TOPICAL

## 2022-04-10 MED ORDER — SODIUM CHLORIDE 0.9 % IV SOLN: 250.0000 mL | INTRAVENOUS | Status: DC

## 2022-04-10 MED ORDER — DULOXETINE HCL 60 MG PO CPEP
60.0000 mg | ORAL_CAPSULE | Freq: Every day | ORAL | Status: DC
  Administered 2022-04-11 – 2022-04-12 (×2): 60 mg via ORAL
  Filled 2022-04-10 (×2): qty 1

## 2022-04-10 MED ORDER — MORPHINE SULFATE (PF) 2 MG/ML IV SOLN: 2.0000 mg | INTRAVENOUS | Status: DC | PRN

## 2022-04-10 MED ORDER — MUPIROCIN 2 % EX OINT
1.0000 | TOPICAL_OINTMENT | Freq: Two times a day (BID) | CUTANEOUS | Status: AC
  Administered 2022-04-10 – 2022-04-14 (×7): 1 via NASAL
  Filled 2022-04-10 (×4): qty 22

## 2022-04-10 MED ORDER — DEXTROSE IN LACTATED RINGERS 5 % IV SOLN: INTRAVENOUS | Status: DC

## 2022-04-10 MED ORDER — SODIUM CHLORIDE 0.9 % IV BOLUS
500.0000 mL | Freq: Once | INTRAVENOUS | Status: AC
  Administered 2022-04-10: 500 mL via INTRAVENOUS

## 2022-04-10 MED ORDER — ASPIRIN 81 MG PO TBEC
81.0000 mg | DELAYED_RELEASE_TABLET | Freq: Every day | ORAL | Status: DC
  Administered 2022-04-10 – 2022-04-18 (×7): 81 mg via ORAL
  Filled 2022-04-10 (×8): qty 1

## 2022-04-10 MED ORDER — INSULIN REGULAR(HUMAN) IN NACL 100-0.9 UT/100ML-% IV SOLN
INTRAVENOUS | Status: DC
  Administered 2022-04-10: 11.5 [IU]/h via INTRAVENOUS
  Administered 2022-04-10 (×2): 18 [IU]/h via INTRAVENOUS
  Administered 2022-04-11: 21 [IU]/h via INTRAVENOUS
  Administered 2022-04-11: 16 [IU]/h via INTRAVENOUS
  Filled 2022-04-10 (×4): qty 100

## 2022-04-10 MED ORDER — SODIUM CHLORIDE 0.9 % IV SOLN: INTRAVENOUS | Status: DC | PRN

## 2022-04-10 MED ORDER — HYDROXYZINE HCL 25 MG PO TABS
50.0000 mg | ORAL_TABLET | Freq: Every day | ORAL | Status: DC
  Administered 2022-04-10: 50 mg via ORAL
  Filled 2022-04-10: qty 2

## 2022-04-10 MED ORDER — ETOMIDATE 2 MG/ML IV SOLN
INTRAVENOUS | Status: AC
  Filled 2022-04-10: qty 20

## 2022-04-10 MED ORDER — NOREPINEPHRINE 4 MG/250ML-% IV SOLN
INTRAVENOUS | Status: AC
  Administered 2022-04-10: 2 ug/min via INTRAVENOUS
  Filled 2022-04-10: qty 250

## 2022-04-10 MED ORDER — ROCURONIUM BROMIDE 10 MG/ML (PF) SYRINGE
PREFILLED_SYRINGE | INTRAVENOUS | Status: AC
  Filled 2022-04-10: qty 10

## 2022-04-10 MED ORDER — SODIUM CHLORIDE 0.9 % IV BOLUS
1000.0000 mL | Freq: Once | INTRAVENOUS | Status: AC
  Administered 2022-04-10: 1000 mL via INTRAVENOUS

## 2022-04-10 MED ORDER — LACTATED RINGERS IV BOLUS
1000.0000 mL | Freq: Once | INTRAVENOUS | Status: AC
  Administered 2022-04-10: 1000 mL via INTRAVENOUS

## 2022-04-10 MED ORDER — INSULIN ASPART 100 UNIT/ML IV SOLN
10.0000 [IU] | Freq: Once | INTRAVENOUS | Status: AC
  Administered 2022-04-10: 10 [IU] via INTRAVENOUS

## 2022-04-10 MED ORDER — LORATADINE 10 MG PO TABS
10.0000 mg | ORAL_TABLET | Freq: Every day | ORAL | Status: DC
Start: 2022-04-11 — End: 2022-04-11

## 2022-04-10 MED ORDER — FENTANYL 2500MCG IN NS 250ML (10MCG/ML) PREMIX INFUSION
INTRAVENOUS | Status: AC
  Filled 2022-04-10: qty 250

## 2022-04-10 MED ORDER — ACETAMINOPHEN 325 MG PO TABS
650.0000 mg | ORAL_TABLET | Freq: Four times a day (QID) | ORAL | Status: DC | PRN
  Administered 2022-04-10 – 2022-04-12 (×4): 650 mg via ORAL
  Filled 2022-04-10 (×4): qty 2

## 2022-04-10 MED ORDER — LACTATED RINGERS IV SOLN: INTRAVENOUS | Status: DC

## 2022-04-10 MED ORDER — FENTANYL CITRATE PF 50 MCG/ML IJ SOSY
25.0000 ug | PREFILLED_SYRINGE | INTRAMUSCULAR | Status: DC | PRN
  Administered 2022-04-10: 25 ug via INTRAVENOUS
  Filled 2022-04-10: qty 1

## 2022-04-10 MED ORDER — OXYCODONE HCL 5 MG PO TABS
5.0000 mg | ORAL_TABLET | ORAL | Status: DC | PRN
  Administered 2022-04-10 (×2): 5 mg via ORAL
  Filled 2022-04-10 (×2): qty 1

## 2022-04-10 MED ORDER — ONDANSETRON HCL 4 MG/2ML IJ SOLN
4.0000 mg | Freq: Once | INTRAMUSCULAR | Status: AC
Start: 2022-04-10 — End: 2022-04-10
  Administered 2022-04-10: 4 mg via INTRAVENOUS
  Filled 2022-04-10: qty 2

## 2022-04-10 MED ORDER — SUCCINYLCHOLINE CHLORIDE 200 MG/10ML IV SOSY
PREFILLED_SYRINGE | INTRAVENOUS | Status: AC
  Filled 2022-04-10: qty 10

## 2022-04-10 MED ORDER — ONDANSETRON HCL 4 MG/2ML IJ SOLN
4.0000 mg | Freq: Four times a day (QID) | INTRAMUSCULAR | Status: DC | PRN
  Administered 2022-04-11 – 2022-05-10 (×13): 4 mg via INTRAVENOUS
  Filled 2022-04-10 (×14): qty 2

## 2022-04-10 MED ORDER — NOREPINEPHRINE 4 MG/250ML-% IV SOLN: 0.0000 ug/min | INTRAVENOUS | Status: DC

## 2022-04-10 MED ORDER — PHENYLEPHRINE HCL-NACL 20-0.9 MG/250ML-% IV SOLN
INTRAVENOUS | Status: AC
  Filled 2022-04-10: qty 250

## 2022-04-10 MED ORDER — PROPOFOL 1000 MG/100ML IV EMUL
INTRAVENOUS | Status: AC
  Filled 2022-04-10: qty 100

## 2022-04-10 MED ORDER — SENNOSIDES-DOCUSATE SODIUM 8.6-50 MG PO TABS
1.0000 | ORAL_TABLET | Freq: Two times a day (BID) | ORAL | Status: DC
  Administered 2022-04-10 – 2022-04-18 (×14): 1 via ORAL
  Filled 2022-04-10 (×14): qty 1

## 2022-04-10 MED ORDER — FENTANYL 2500MCG IN NS 250ML (10MCG/ML) PREMIX INFUSION: 0.0000 ug/h | INTRAVENOUS | Status: DC

## 2022-04-10 MED ORDER — NOREPINEPHRINE 4 MG/250ML-% IV SOLN
2.0000 ug/min | INTRAVENOUS | Status: DC
  Administered 2022-04-11: 10 ug/min via INTRAVENOUS
  Administered 2022-04-11: 13 ug/min via INTRAVENOUS
  Administered 2022-04-11: 14 ug/min via INTRAVENOUS
  Filled 2022-04-10 (×3): qty 250

## 2022-04-10 MED ORDER — BISACODYL 10 MG RE SUPP
10.0000 mg | Freq: Once | RECTAL | Status: AC
Start: 2022-04-10 — End: 2022-04-10
  Administered 2022-04-10: 10 mg via RECTAL
  Filled 2022-04-10: qty 1

## 2022-04-10 MED ORDER — MIDODRINE HCL 5 MG PO TABS: 10.0000 mg | ORAL_TABLET | Freq: Three times a day (TID) | ORAL | Status: DC

## 2022-04-10 NOTE — Assessment & Plan Note (Signed)
--   markedly elevated triglycerides likely precipitated acute pancreatitis -- treating with high dose IV insulin -- recheck lipid panel in AM

## 2022-04-10 NOTE — Progress Notes (Signed)
[place] OF CARE NOTE   04/10/2022 12:30 PM  Mathew Lara was seen and examined.  The H&P by the admitting provider, orders, imaging was reviewed.  Please see new orders.  Will continue to follow.   Assessment and Plan: * DKA (diabetic ketoacidosis) ([place]) - Glucose 774, bicarb 16, gap 20, pH 7.19 -Does not take insulin at home -Pt is severely insulin resistant requiring high doses of IV insulin  -Monitor BMP every 4 hours -aggressive IV fluid hydration ordered  -N.p.o. -Monitor CBGs every hour while on IV insulin -Follow up hemoglobin A1c  Lactic acidosis --secondary to severe dehydration, DKA, pancreatitis -- treating supportively with IV fluid hydration  -- follow and trend lactate to improvement  Hypercholesterolemia --TC>300  -- treating with IV insulin for now -- recheck lipid panel in AM   Hypertriglyceridemia -- markedly elevated triglycerides likely precipitated acute pancreatitis -- treating with high dose IV insulin -- recheck lipid panel in AM   SIRS (systemic inflammatory response syndrome) (HCC) -SEPSIS RULED OUT  - Heart rate 115, respiratory rate 28, leukocytosis 14.0, lactic acid 5.1 - treating supportively   Acute pancreatitis - Abdominal pain, lipase 2291, CT abdomen pelvis consistent with acute pancreatitis -N.p.o. -Pain control with pain scale -aggressively hydrate, increase IVF rate to 250 ml/hour -Treat DKA -Right upper quadrant ultrasound -Lipid panel reveals hypertriglyceridemia and TC>300, treat with IV insulin -Pt is critically ill with high mortality risk, low threshold to intubate and transfer to higher level of care  Chronic obstructive pulmonary disease (HCC) - Continue albuterol as needed -VBG shows PCO2 of 44 -No wheeze, not clinically in exacerbation-respiratory failure at this time is most likely secondary to the acidosis  GERD (gastroesophageal reflux disease) - Protonix IV for critical illness for GI protection   AKI on  CKD stage 3b -creatinine bumped to 3.3 -Holding losartan -Continue fluid bolus and maintenance fluids -Prerenal in the setting of DKA and pancreatitis - pt is critically ill   Acute respiratory failure with hypoxia (HCC) - secondary to critical illness -he remains on nasal cannula - low threshold to intubate if he decompensates  History of alcohol abuse - He denies current alcohol abuse but reports taking unprescribed pain medication that he cannot recall name -Check urine toxicology screen  Essential hypertension - Holding losartan in the setting of AKI   Vitals:   04/10/22 1117 04/10/22 1218  BP:    Pulse:    Resp:    Temp:  98.7 F (37.1 C)  SpO2: 97%     Results for orders placed or performed during the hospital encounter of 04/10/22  Culture, blood (Routine X 2) w Reflex to ID Panel   Specimen: Left Antecubital; Blood  Result Value Ref Range   Specimen Description      LEFT ANTECUBITAL BOTTLES DRAWN AEROBIC AND ANAEROBIC   Special Requests      Blood Culture adequate volume Performed at [facility], [address] [place], [place] [address]    Culture PENDING    Report Status PENDING   Culture, blood (Routine X 2) w Reflex to ID Panel   Specimen: BLOOD RIGHT ARM  Result Value Ref Range   Specimen Description      BLOOD RIGHT ARM BOTTLES DRAWN AEROBIC AND ANAEROBIC   Special Requests      Blood Culture adequate volume Performed at [facility], [address] [place], [place] [address]    Culture PENDING    Report Status PENDING   MRSA Next Gen by  PCR, Nasal   Specimen: Nasal Mucosa; Nasal Swab  Result Value Ref Range   MRSA by PCR Next Gen DETECTED (A) NOT DETECTED  CBC  Result Value Ref Range   WBC 14.0 (H) 4.0 - 10.5 K/uL   RBC 5.52 4.22 - 5.81 MIL/uL   Hemoglobin 14.7 13.0 - 17.0 g/dL   HCT 45.4 39.0 - 52.0 %   MCV 82.2 80.0 - 100.0 fL   MCH 26.6 26.0 - 34.0 pg   MCHC 32.4 30.0 - 36.0 g/dL   RDW 15.9 (H) 11.5 - 15.5 %   Platelets 320 150 -  400 K/uL   nRBC 0.0 0.0 - 0.2 %  Comprehensive metabolic panel  Result Value Ref Range   Sodium 125 (L) 135 - 145 mmol/L   Potassium 5.0 3.5 - 5.1 mmol/L   Chloride 89 (L) 98 - 111 mmol/L   CO2 16 (L) 22 - 32 mmol/L   Glucose, Bld 774 (HH) 70 - 99 mg/dL   BUN 44 (H) 8 - 23 mg/dL   Creatinine, Ser 3.33 (H) 0.61 - 1.24 mg/dL   Calcium 9.4 8.9 - 10.3 mg/dL   Total Protein 9.4 (H) 6.5 - 8.1 g/dL   Albumin 3.9 3.5 - 5.0 g/dL   AST 26 15 - 41 U/L   ALT 19 0 - 44 U/L   Alkaline Phosphatase 99 38 - 126 U/L   Total Bilirubin 0.9 0.3 - 1.2 mg/dL   GFR, Estimated 20 (L) >60 mL/min   Anion gap 20 (H) 5 - 15  Lipase, blood  Result Value Ref Range   Lipase 2,291 (H) 11 - 51 U/L  Lactic acid, plasma  Result Value Ref Range   Lactic Acid, Venous 5.1 (HH) 0.5 - 1.9 mmol/L  Basic metabolic panel  Result Value Ref Range   Sodium 130 (L) 135 - 145 mmol/L   Potassium 4.9 3.5 - 5.1 mmol/L   Chloride 98 98 - 111 mmol/L   CO2 16 (L) 22 - 32 mmol/L   Glucose, Bld 608 (HH) 70 - 99 mg/dL   BUN 41 (H) 8 - 23 mg/dL   Creatinine, Ser 3.20 (H) 0.61 - 1.24 mg/dL   Calcium 8.7 (L) 8.9 - 10.3 mg/dL   GFR, Estimated 21 (L) >60 mL/min   Anion gap 16 (H) 5 - 15  Beta-hydroxybutyric acid  Result Value Ref Range   Beta-Hydroxybutyric Acid 1.76 (H) 0.05 - 0.27 mmol/L  Urinalysis, Routine w reflex microscopic Nasal Mucosa  Result Value Ref Range   Color, Urine YELLOW YELLOW   APPearance CLEAR CLEAR   Specific Gravity, Urine 1.026 1.005 - 1.030   pH 5.0 5.0 - 8.0   Glucose, UA >=500 (A) NEGATIVE mg/dL   Hgb urine dipstick SMALL (A) NEGATIVE   Bilirubin Urine NEGATIVE NEGATIVE   Ketones, ur 5 (A) NEGATIVE mg/dL   Protein, ur 100 (A) NEGATIVE mg/dL   Nitrite NEGATIVE NEGATIVE   Leukocytes,Ua NEGATIVE NEGATIVE   RBC / HPF 0-5 0 - 5 RBC/hpf   WBC, UA 0-5 0 - 5 WBC/hpf   Bacteria, UA NONE SEEN NONE SEEN   Mucus PRESENT   Blood gas, venous  Result Value Ref Range   pH, Ven 7.19 (LL) 7.25 - 7.43   pCO2,  Ven 44 44 - 60 mmHg   pO2, Ven 47 (H) 32 - 45 mmHg   Bicarbonate 16.8 (L) 20.0 - 28.0 mmol/L   Acid-base deficit 11.3 (H) 0.0 - 2.0 mmol/L   O2 Saturation 71.7 %  Patient temperature 36.4    Collection site BLOOD RIGHT FOREARM    Drawn by [phone]   Lactic acid, plasma  Result Value Ref Range   Lactic Acid, Venous 6.8 (HH) 0.5 - 1.9 mmol/L  Procalcitonin - Baseline  Result Value Ref Range   Procalcitonin 0.54 ng/mL  Lipid panel  Result Value Ref Range   Cholesterol 350 (H) 0 - 200 mg/dL   Triglycerides 1,864 (H) <150 mg/dL   HDL NOT REPORTED DUE TO HIGH TRIGLYCERIDES >40 mg/dL   Total CHOL/HDL Ratio NOT REPORTED DUE TO HIGH TRIGLYCERIDES RATIO   VLDL UNABLE TO CALCULATE IF TRIGLYCERIDE OVER 400 mg/dL 0 - 40 mg/dL   LDL Cholesterol UNABLE TO CALCULATE IF TRIGLYCERIDE OVER 400 mg/dL 0 - 99 mg/dL  Hemoglobin A1c  Result Value Ref Range   Hgb A1c MFr Bld 12.5 (H) 4.8 - 5.6 %   Mean Plasma Glucose 312.05 mg/dL  Ethanol  Result Value Ref Range   Alcohol, Ethyl (B) <10 <10 mg/dL  Rapid urine drug screen (hospital performed)  Result Value Ref Range   Opiates POSITIVE (A) NONE DETECTED   Cocaine NONE DETECTED NONE DETECTED   Benzodiazepines NONE DETECTED NONE DETECTED   Amphetamines NONE DETECTED NONE DETECTED   Tetrahydrocannabinol NONE DETECTED NONE DETECTED   Barbiturates NONE DETECTED NONE DETECTED  Lactic acid, plasma  Result Value Ref Range   Lactic Acid, Venous 5.5 (HH) 0.5 - 1.9 mmol/L  Glucose, capillary  Result Value Ref Range   Glucose-Capillary >600 (HH) 70 - 99 mg/dL  Glucose, capillary  Result Value Ref Range   Glucose-Capillary 591 (HH) 70 - 99 mg/dL   Comment 1 Notify RN   LDL cholesterol, direct  Result Value Ref Range   Direct LDL 16 0 - 99 mg/dL  Glucose, capillary  Result Value Ref Range   Glucose-Capillary 534 (HH) 70 - 99 mg/dL   Comment 1 Notify RN   Glucose, capillary  Result Value Ref Range   Glucose-Capillary 528 (HH) 70 - 99 mg/dL   Comment 1  Notify RN   Glucose, capillary  Result Value Ref Range   Glucose-Capillary 471 (H) 70 - 99 mg/dL  Glucose, capillary  Result Value Ref Range   Glucose-Capillary 488 (H) 70 - 99 mg/dL  CBG monitoring, ED  Result Value Ref Range   Glucose-Capillary >600 (HH) 70 - 99 mg/dL  CBG monitoring, ED  Result Value Ref Range   Glucose-Capillary >600 (HH) 70 - 99 mg/dL   Critical Care Procedure Note Authorized and Performed by: Murvin Natal MD  Total Critical Care time:  55 mins Due to a high probability of clinically significant, life threatening deterioration, the patient required my highest level of preparedness to intervene emergently and I personally spent this critical care time directly and personally managing the patient.  This critical care time included obtaining a history; examining the patient, pulse oximetry; ordering and review of studies; arranging urgent treatment with development of a management plan; evaluation of patient's response of treatment; frequent reassessment; and discussions with other providers.  This critical care time was performed to assess and manage the high probability of imminent and life threatening deterioration that could result in multi-organ failure.  It was exclusive of separately billable procedures and treating other patients and teaching time.    Murvin Natal, MD [facility]   04/10/2022  3:40 AM How to contact the [facility] Attending or Consulting provider [place] or covering provider during after hours [place], for this patient?  Check the care team in [facility] and look for a) attending/consulting [facility] provider listed and b) the [facility] team listed Log into [email or web address] and use [place]'s universal password to access. If you do not have the password, please contact the hospital operator. Locate the [facility] provider you are looking for under [facility] and page to a number that you can be directly reached. If you still have difficulty reaching the provider, please  page the [facility] (Director on Call) for the Hospitalists listed on amion for assistance.

## 2022-04-10 NOTE — Assessment & Plan Note (Addendum)
-   Abdominal pain, lipase 2291, CT abdomen pelvis consistent with acute pancreatitis -N.p.o. -Pain control with pain scale -aggressively hydrate, increase IVF rate to 250 ml/hour -Treat DKA -Right upper quadrant ultrasound -Lipid panel reveals hypertriglyceridemia and TC>300, treat with IV insulin -Pt is critically ill with high mortality risk, low threshold to intubate and transfer to higher level of care

## 2022-04-10 NOTE — Progress Notes (Signed)
Placed Patient on home machine nasal prongs at 11 liters per minute. Machine settings 15/11 patient did have decreased saturation by Pulse ox but not sure to exact reading do to poor wave form. Patients saturation slowly begin to climb . Dr De Burrs from er arrived with DR Josph Macho deciding they were going to intubate patient. After 15 minutes changed mind. Not sure as what precipitated all this. Nurse in room unaware as RT. Patient after some time placed back on V60 but still using his nasal mask.

## 2022-04-10 NOTE — Assessment & Plan Note (Addendum)
-   He denies current alcohol abuse but reports taking unprescribed pain medication that he cannot recall name -Check urine toxicology screen

## 2022-04-10 NOTE — Assessment & Plan Note (Addendum)
-  creatinine bumped to 3.3 -Holding losartan -Continue fluid bolus and maintenance fluids -Prerenal in the setting of DKA and pancreatitis - pt is critically ill

## 2022-04-10 NOTE — Assessment & Plan Note (Signed)
--  secondary to severe dehydration, DKA, pancreatitis -- treating supportively with IV fluid hydration  -- follow and trend lactate to improvement

## 2022-04-10 NOTE — H&P (Signed)
History and Physical    Patient: Mathew Lara XBM:[id number] DOB: 06-26-1961 DOA: 04/10/2022 DOS: the patient was seen and examined on 04/10/2022 PCP: [name], MD  Patient coming from: Home  Chief Complaint:  Chief Complaint  Patient presents with   Abdominal Pain   HPI: Mathew Lara is a 61 y.o. male with medical history significant of alcohol abuse, anxiety, CKD, COPD, diabetes mellitus type 2, essential hypertension, hepatitis C, mixed hyperlipidemia, and more presents the ED with a chief complaint of abdominal pain.  Patient reports that he has epigastric abdominal pain that feels like a cramping pain.  It radiates down to his lower abdomen.  It has been constant.  Patient received pain medication in the ER which helped but did not relieve the pain.  He reports his last bowel movement was day before yesterday.  It was normal for him.  He has not had any dysuria, hematuria.  He has not been vomiting but has had nausea.  Patient is a diabetic and home and only oral hypoglycemics.  He reports that up until his abdominal pain started he has been in his normal state of health.  At the time of my exam patient was hypertensive likely due to Dilaudid that had just been given.  He is also having increased work of breathing.  So history was cut short due to his acuity of illness.  Patient has documented history of alcohol abuse but reports he does not drink anymore. Review of Systems: unable to review all systems due to the inability of the patient to answer questions.- Due to acuity of illness Past Medical History:  Diagnosis Date   Alcohol abuse    Anxiety    Bronchitis    Chronic hip pain    CKD (chronic kidney disease) stage 3, GFR 30-59 ml/min (HCC)    COPD (chronic obstructive pulmonary disease) ([place])    DKA (diabetic ketoacidosis) ([place]) 04/10/2022   Essential hypertension    Hepatitis C    Mixed hyperlipidemia    Sleep apnea    Past Surgical History:  Procedure Laterality  Date   BALLOON DILATION N/A 07/06/2021   Procedure: BALLOON DILATION;  Surgeon: [name], DO;  [facility];  Service: Endoscopy;  Laterality: N/A;   BIOPSY  07/06/2021   Procedure: BIOPSY;  Surgeon: [name], DO;  [facility];  Service: Endoscopy;;   CATARACT EXTRACTION Right    CHOLECYSTECTOMY N/A 03/23/2021   Procedure: LAPAROSCOPIC CHOLECYSTECTOMY;  Surgeon: [name], MD;  Location: [facility] ORS;  Service: General;  Laterality: N/A;   COLONOSCOPY N/A 01/23/2014   SLF:The LEFT COLON IS EXTREMELY redundant/TWO RECTAL POLYPS REMOVED/SMALL VOLUME RECTAL BLEEDING MOST LIKELY DUE TO Small internal hemorrhoids. path with prolapse type polyp of benign-mucosa. no adenomas   COLONOSCOPY WITH PROPOFOL N/A 07/06/2021   non-bleeding internal hemorrhoids, one 8 mm polyp in sigmod s/p removal and clip placed. One 5 mm polyp at recto-sigmoid s/p removal. 3 year surveillance. Tubular adenomas   ESOPHAGOGASTRODUODENOSCOPY N/A 01/23/2014   [id number] ring at the gastroesophageal junction/Medium sized hiatal hernia/NDYSPEPSIA MOST LIKELY DUE TO GERD/MILD Non-erosive gastritis   ESOPHAGOGASTRODUODENOSCOPY N/A 04/18/2018   food impaction, small hiatal hernia   ESOPHAGOGASTRODUODENOSCOPY (EGD) WITH PROPOFOL N/A 07/06/2021   moderate Schatzki ring s/p dilation, gastritis s/p biopsy, normal duodenum. Negative H.pylori.   HEMORRHOID BANDING  01/23/2014   Procedure: HEMORRHOID BANDING;  Surgeon: Danie Binder, MD;  [facility];  Service: Endoscopy;;   JOINT  REPLACEMENT Right 2017   LIVER BIOPSY N/A 03/23/2021   Procedure: LAPAROSCOPIC LIVER BIOPSY;  Surgeon: [name], MD;  Location: [facility];  Service: General;  Laterality: N/A;   MOUTH SURGERY     POLYPECTOMY  07/06/2021   Procedure: POLYPECTOMY;  Surgeon: [name], DO;  [facility];  Service: Endoscopy;;   TOTAL HIP ARTHROPLASTY Right 06/04/2016   Procedure: RIGHT TOTAL  HIP ARTHROPLASTY ANTERIOR APPROACH;  Surgeon: Mcarthur Rossetti, MD;  Location: [facility];  Service: Orthopedics;  Laterality: Right;   TOTAL HIP ARTHROPLASTY Left 03/02/2019   Procedure: LEFT TOTAL HIP ARTHROPLASTY ANTERIOR APPROACH;  Surgeon: Mcarthur Rossetti, MD;  Location: [facility] OR[facility];  Service: Orthopedics;  Laterality: Left;   Social History:  reports that he quit smoking about 7 years ago. His smoking use included cigarettes. He started smoking about 50 years ago. He has a 42.00 pack-year smoking history. He has been exposed to tobacco smoke. He has never used smokeless tobacco. He reports that he does not currently use alcohol. He reports that he does not use drugs.  No Known Allergies  Family History  Problem Relation Age of Onset   Cancer Mother    Colon cancer Neg Hx    Colon polyps Neg Hx     Prior to Admission medications   Medication Sig Start Date End Date Taking? Authorizing Provider  albuterol (PROAIR HFA) 108 (90 Base) MCG/ACT inhaler Inhale 2 puffs into the lungs every 4 (four) hours as needed for wheezing or shortness of breath. 02/15/22   Chesley Mires, MD  amLODipine (NORVASC) 10 MG tablet Take 10 mg by mouth daily. 02/05/19   [provider]  amoxicillin-clavulanate (AUGMENTIN) 875-125 MG tablet Take 1 tablet by mouth 2 (two) times daily. 03/16/22   Chesley Mires, MD  aspirin EC 81 MG tablet Take 81 mg by mouth daily.    [provider]  cholecalciferol (VITAMIN D) 25 MCG (1000 UNIT) tablet Take 1,000 Units by mouth daily.    [provider]  DULoxetine (CYMBALTA) 60 MG capsule Take 60 mg by mouth daily. 06/15/21   [provider]  hydrOXYzine (ATARAX/VISTARIL) 25 MG tablet Take 50 mg by mouth at bedtime.    [provider]  loratadine (CLARITIN) 10 MG tablet Take 1 tablet (10 mg total) by mouth daily. 09/02/19   Barton Dubois, MD  losartan (COZAAR) 50 MG tablet Take 50 mg by mouth daily. 02/11/20   [provider]   oxyCODONE (OXY IR/ROXICODONE) 5 MG immediate release tablet Take 5 mg by mouth every 8 (eight) hours as needed for moderate pain. 02/25/20   [provider]  pantoprazole (PROTONIX) 40 MG tablet Take 1 tablet (40 mg total) by mouth 2 (two) times daily before a meal. 30 minutes before breakfast 03/08/22 09/04/22  Annitta Needs, NP  sildenafil (VIAGRA) 100 MG tablet Take 100 mg by mouth daily as needed for erectile dysfunction. 04/21/21   [provider]    Physical Exam: Vitals:   04/10/22 0408 04/10/22 0430 04/10/22 0445 04/10/22 0558  BP:    98/63  Pulse:    (!) 115  Resp:      Temp:      TempSrc:      SpO2:  95% 98% 95%  Weight: 134.5 kg     Height: '5\' 8"'$  (1.727 m)      1.  General: Patient lying supine in bed, head of bed elevated, leaning over with pursed lips breathing, no  acute distress   2. Psychiatric: Alert and oriented x 3, mood and behavior normal for situation, pleasant and cooperative with exam   3. Neurologic: Speech and language are normal, face is symmetric, moves all 4 extremities voluntarily, at baseline without acute deficits on limited exam   4. HEENMT:  Head is atraumatic, normocephalic, pupils reactive to light, neck is supple, trachea is midline, mucous membranes are moist   5. Respiratory : Diminished breath sounds in the lower lung fields, no wheezing, rhonchi, crackles. Pursed lips breathing, borderline tripoding.    6. Cardiovascular : Heart rate tachycardic, rhythm is regular, no murmurs, rubs or gallops, no peripheral edema, peripheral pulses palpated   7. Gastrointestinal:  Abdomen is soft, distended, no fluid wave, tender to palpation, bowel sounds active, no masses or organomegaly palpated   8. Skin:  Skin is warm, dry and intact without rashes, acute lesions, or ulcers on limited exam   9.Musculoskeletal:  No acute deformities or trauma, no asymmetry in tone, no peripheral edema, peripheral pulses palpated, no tenderness to  palpation in the extremities  Data Reviewed: T 97.6, HR 115, R 25-28, BP 98/63 - 103/78, O2 88 - 98% Leukocytosis 14.0, Hgb 14.7 Psuedohyponatremia 125, K+ 5.0, Cl 89, Bicarb 16, BUN 44, Cr 3.33 Gap 20 Lactic acid 5.1 Blood culture pending  UA pending CT abd pelvis - Acute pancreatitis Assessment and Plan: * DKA (diabetic ketoacidosis) (HCC) - Glucose 774, bicarb 16, gap 20, pH 7.19 -Does not take insulin at home -Started on insulin drip -Monitor BMP every 4 hours -2-1/2 L LR hanging now -Continue maintenance fluids at 150 mL/h -N.p.o. -Monitor CBGs -Add on hemoglobin A1c  SIRS (systemic inflammatory response syndrome) (HCC) - Heart rate 115, respiratory rate 28, leukocytosis 14.0, lactic acid 5.1 -Could all be due to DKA -Blood culture pending -UA pending -CT abdomen pelvis shows pancreatitis without abscess or cyst -Chest x-ray shows no bulla without acute changes -Procalcitonin added on -Continue to monitor  Acute pancreatitis - Abdominal pain, lipase 2291, CT abdomen pelvis consistent with acute pancreatitis -N.p.o. -Pain control with pain scale -Continue LR -Continue to treat DKA -Right upper quadrant ultrasound in the a.m. -Lipid panel -Continue to monitor  Chronic obstructive pulmonary disease (HCC) - Continue albuterol as needed -VBG shows PCO2 of 44 -No wheeze, not clinically in exacerbation-respiratory failure at this time is most likely secondary to the acidosis  GERD (gastroesophageal reflux disease) - Holding Protonix with AKI -Continue to monitor  AKI (acute kidney injury) (HCC) - Creatinine baseline 2.3 -Today 3.3 -Hold losartan -Continue fluid bolus and maintenance fluids -Prerenal in the setting of DKA and hypotension -Continue to monitor  Acute respiratory failure with hypoxia (HCC) - Likely related to acidosis -Stabilized on 2 L nasal cannula -Has been as low as mid 80s O2 sats on room air -History of COPD -Continue albuterol as  needed -Wean off O2 as tolerated -Chest x-ray showed thick-walled bulla that had been previously demonstrated, no acute changes -Continue to monitor  History of alcohol abuse - No current alcohol abuse -Continue to monitor  Essential hypertension - Holding losartan in the setting of hypotension -Continue to monitor      Advance Care Planning:   Code Status: Prior FULL  Consults: none  Family Communication: Daughter and sister at bedside  Severity of Illness: The appropriate patient status for this patient is INPATIENT. Inpatient status is judged to be reasonable and necessary in order to provide the required intensity of service to ensure the patient's  safety. The patient's presenting symptoms, physical exam findings, and initial radiographic and laboratory data in the context of their chronic comorbidities is felt to place them at high risk for further clinical deterioration. Furthermore, it is not anticipated that the patient will be medically stable for discharge from the hospital within 2 midnights of admission.   * I certify that at the point of admission it is my clinical judgment that the patient will require inpatient hospital care spanning beyond 2 midnights from the point of admission due to high intensity of service, high risk for further deterioration and high frequency of surveillance required.*  Author: Rolla Plate, DO 04/10/2022 7:03 AM  For on call review [email or web address].

## 2022-04-10 NOTE — Assessment & Plan Note (Addendum)
-   Glucose 774, bicarb 16, gap 20, pH 7.19 -Does not take insulin at home -Pt is severely insulin resistant requiring high doses of IV insulin  -Monitor BMP every 4 hours -aggressive IV fluid hydration ordered  -N.p.o. -Monitor CBGs every hour while on IV insulin -Follow up hemoglobin A1c

## 2022-04-10 NOTE — Assessment & Plan Note (Signed)
--  TC>300  -- treating with IV insulin for now -- recheck lipid panel in AM

## 2022-04-10 NOTE — ED Notes (Signed)
Pt to CT at this time.

## 2022-04-10 NOTE — Procedures (Signed)
Arterial Catheter Insertion Procedure Note  ELIZJAH NOBLET  [id number]  1960-09-24  Date:04/10/22  Time:11:05 PM    Provider Performing: Rosanna Randy P    Procedure: Insertion of Arterial Line [phone]) without US guidance  Indication(s) Blood pressure monitoring and/or need for frequent ABGs  Consent Risks of the procedure as well as the alternatives and risks of each were explained to the patient and/or caregiver.  Consent for the procedure was obtained and is signed in the bedside chart  Anesthesia None   Time Out Verified patient identification, verified procedure, site/side was marked, verified correct patient position, special equipment/implants available, medications/allergies/relevant history reviewed, required imaging and test results available.   Sterile Technique Maximal sterile technique including full sterile barrier drape, hand hygiene, sterile gown, sterile gloves, mask, hair covering, sterile ultrasound probe cover (if used).   Procedure Description Area of catheter insertion was cleaned with chlorhexidine and draped in sterile fashion. Without real-time ultrasound guidance an arterial catheter was placed into the left radial artery.  Appropriate arterial tracings confirmed on monitor.     Complications/Tolerance None; patient tolerated the procedure well.   EBL Minimal   Specimen(s) None

## 2022-04-10 NOTE — Assessment & Plan Note (Addendum)
-   Holding losartan in the setting of AKI

## 2022-04-10 NOTE — Progress Notes (Signed)
RN paged for patient's increased work of breathing, diaphoresis, restlessness in bed.  Reevaluated at bedside.  Patient did have increased work of breathing, but was maintaining oxygen sats and breathing about 30 times per minute.  After discussion with the ER doc we decided to do an elective intubation before patient tires out.  Risks and benefits were explained to family, patient was agreeable.  We did not end up proceeding with the intubation because we cannot get a reliable oxygen sat likely due to vasoconstriction while being on Levophed.  On 15 mics of Levophed, patient still only had MAPs in the 32s.  I called [facility], ICU to discuss with them and they have accepted this patient as he is out of the scope of our ICU care and needs to be managed by intensivist.  There was concern about patient going into respiratory distress in route, so BiPAP was started.  Patient is currently stable on BiPAP, Levophed, insulin drip.  Family updated with each step of this process.  We will continue to monitor

## 2022-04-10 NOTE — TOC Progression Note (Signed)
Transition of Care [facility]) - Progression Note    Patient Details  Name: Mathew Lara MRN: [id number] Date of Birth: [birth year]  Transition of Care [facility]) CM/SW Contact  Salome Arnt, [place] Phone Number: 04/10/2022, 9:20 AM  Clinical Narrative:   Transition of Care [facility]) Screening Note   Patient Details  Name: Mathew Lara Date of Birth: 06/29/1961   Transition of Care [facility]) CM/SW Contact:    Salome Arnt, [place] Phone Number: 04/10/2022, 9:20 AM    Transition of Care Department [facility]) has reviewed patient and no TOC needs have been identified at this time. We will continue to monitor patient advancement through interdisciplinary progression rounds. If new patient transition needs arise, please place a TOC consult.            Expected Discharge Plan and Services                                                 Social Determinants of Health (SDOH) Interventions    Readmission Risk Interventions     No data to display

## 2022-04-10 NOTE — Assessment & Plan Note (Addendum)
-  SEPSIS RULED OUT  - Heart rate 115, respiratory rate 28, leukocytosis 14.0, lactic acid 5.1 - treating supportively

## 2022-04-10 NOTE — Progress Notes (Signed)
Patient did not tolerate hospital BiPAP because of anxiety. Family retrieving his BiPAP from home. Will Try it when aunt returns.

## 2022-04-10 NOTE — ED Notes (Signed)
ED TO INPATIENT HANDOFF REPORT  ED Nurse Name and Phone #:   S Name/Age/Gender Mathew Lara 61 y.o. male Room/Bed: APA09/APA09  Code Status   Code Status: Prior  Home/SNF/Other Home Patient oriented to: self, place, time, and situation Is this baseline? Yes   Triage Complete: Triage complete  Chief Complaint DKA (diabetic ketoacidosis) ([place]) [E11.10]  Triage Note Pt presents to ED with generalized abd pain that started last night. Pt O2 sats 88% on room air, pt uses C pap at night and hx of COPD. pt diaphoretic, and has increased generalized weakness. Pt reports last BM was "a day or two ago"   Allergies No Known Allergies  Level of Care/Admitting Diagnosis ED Disposition     ED Disposition  Admit   Condition  --   [place]: [facility] [[id number]]  Level of Care: ICU [6]  Covid Evaluation: Asymptomatic - no recent exposure (last 10 days) testing not required  Diagnosis: DKA (diabetic ketoacidosis) [facility]) [[id number]]  Admitting Physician: Rolla Plate [[id number]]  Attending Physician: Rolla Plate [[id number]]  Certification:: I certify this patient will need inpatient services for at least 2 midnights  Estimated Length of Stay: 3          B Medical/Surgery History Past Medical History:  Diagnosis Date   Alcohol abuse    Anxiety    Bronchitis    Chronic hip pain    CKD (chronic kidney disease) stage 3, GFR 30-59 ml/min (HCC)    COPD (chronic obstructive pulmonary disease) ([place])    DKA (diabetic ketoacidosis) ([place]) 04/10/2022   Essential hypertension    Hepatitis C    Mixed hyperlipidemia    Sleep apnea    Past Surgical History:  Procedure Laterality Date   BALLOON DILATION N/A 07/06/2021   Procedure: BALLOON DILATION;  Surgeon: [name], DO;  [facility];  Service: Endoscopy;  Laterality: N/A;   BIOPSY  07/06/2021   Procedure: BIOPSY;  Surgeon: [name], DO;  [facility];   Service: Endoscopy;;   CATARACT EXTRACTION Right    CHOLECYSTECTOMY N/A 03/23/2021   Procedure: LAPAROSCOPIC CHOLECYSTECTOMY;  Surgeon: [name], MD;  Location: [facility] ORS;  Service: General;  Laterality: N/A;   COLONOSCOPY N/A 01/23/2014   SLF:The LEFT COLON IS EXTREMELY redundant/TWO RECTAL POLYPS REMOVED/SMALL VOLUME RECTAL BLEEDING MOST LIKELY DUE TO Small internal hemorrhoids. path with prolapse type polyp of benign-mucosa. no adenomas   COLONOSCOPY WITH PROPOFOL N/A 07/06/2021   non-bleeding internal hemorrhoids, one 8 mm polyp in sigmod s/p removal and clip placed. One 5 mm polyp at recto-sigmoid s/p removal. 3 year surveillance. Tubular adenomas   ESOPHAGOGASTRODUODENOSCOPY N/A 01/23/2014   KKX:FGHWEXHB ring at the gastroesophageal junction/Medium sized hiatal hernia/NDYSPEPSIA MOST LIKELY DUE TO GERD/MILD Non-erosive gastritis   ESOPHAGOGASTRODUODENOSCOPY N/A 04/18/2018   food impaction, small hiatal hernia   ESOPHAGOGASTRODUODENOSCOPY (EGD) WITH PROPOFOL N/A 07/06/2021   moderate Schatzki ring s/p dilation, gastritis s/p biopsy, normal duodenum. Negative H.pylori.   HEMORRHOID BANDING  01/23/2014   Procedure: HEMORRHOID BANDING;  Surgeon: Danie Binder, MD;  [facility];  Service: Endoscopy;;   JOINT REPLACEMENT Right 2017   LIVER BIOPSY N/A 03/23/2021   Procedure: LAPAROSCOPIC LIVER BIOPSY;  Surgeon: [name], MD;  Location: [facility];  Service: General;  Laterality: N/A;   MOUTH SURGERY     POLYPECTOMY  07/06/2021   Procedure: POLYPECTOMY;  Surgeon: [name], DO;  [facility];  Service: Endoscopy;;   TOTAL HIP ARTHROPLASTY Right 06/04/2016   Procedure: RIGHT TOTAL HIP ARTHROPLASTY ANTERIOR APPROACH;  Surgeon: Mcarthur Rossetti, MD;  Location: [facility];  Service: Orthopedics;  Laterality: Right;   TOTAL HIP ARTHROPLASTY Left 03/02/2019   Procedure: LEFT TOTAL HIP ARTHROPLASTY ANTERIOR APPROACH;  Surgeon: Mcarthur Rossetti,  MD;  Location: [facility];  Service: Orthopedics;  Laterality: Left;     A IV Location/Drains/Wounds Patient Lines/Drains/Airways Status     Active Line/Drains/Airways     Name Placement date Placement time Site Days   Peripheral IV 03/23/21 22 G 1" Right Hand 03/23/21  1048  Hand  383   Peripheral IV 04/10/22 20 G Left Antecubital 04/10/22  0413  Antecubital  less than 1   Peripheral IV 04/10/22 20 G Right;Upper Arm 04/10/22  0542  Arm  less than 1   Incision (Closed) 03/02/19 Hip Left 03/02/19  0923  -- 1135   Incision (Closed) 03/23/21 Abdomen Other (Comment) 03/23/21  0812  -- 383   Incision - 4 Ports Abdomen Umbilicus Mid;Upper Right;Upper Right;Mid;Lateral 03/23/21  0757  -- 383            Intake/Output Last 24 hours No intake or output data in the 24 hours ending 04/10/22 0656  Labs/Imaging Results for orders placed or performed during the hospital encounter of 04/10/22 (from the past 48 hour(s))  CBC     Status: Abnormal   Collection Time: 04/10/22  4:05 AM  Result Value Ref Range   WBC 14.0 (H) 4.0 - 10.5 K/uL   RBC 5.52 4.22 - 5.81 MIL/uL   Hemoglobin 14.7 13.0 - 17.0 g/dL   HCT 45.4 39.0 - 52.0 %   MCV 82.2 80.0 - 100.0 fL   MCH 26.6 26.0 - 34.0 pg   MCHC 32.4 30.0 - 36.0 g/dL   RDW 15.9 (H) 11.5 - 15.5 %   Platelets 320 150 - 400 K/uL   nRBC 0.0 0.0 - 0.2 %    Comment: Performed at [facility], [address] [place], [place] [address]  Comprehensive metabolic panel     Status: Abnormal   Collection Time: 04/10/22  4:05 AM  Result Value Ref Range   Sodium 125 (L) 135 - 145 mmol/L   Potassium 5.0 3.5 - 5.1 mmol/L   Chloride 89 (L) 98 - 111 mmol/L   CO2 16 (L) 22 - 32 mmol/L   Glucose, Bld 774 (HH) 70 - 99 mg/dL    Comment: Glucose reference range applies only to samples taken after fasting for at least 8 hours. CRITICAL RESULT CALLED TO, READ BACK BY AND VERIFIED WITH: TURNER,C @ 0443 ON 04/10/22 BY JUW    BUN 44 (H) 8 - 23 mg/dL   Creatinine, Ser 3.33 (H)  0.61 - 1.24 mg/dL   Calcium 9.4 8.9 - 10.3 mg/dL   Total Protein 9.4 (H) 6.5 - 8.1 g/dL   Albumin 3.9 3.5 - 5.0 g/dL   AST 26 15 - 41 U/L   ALT 19 0 - 44 U/L   Alkaline Phosphatase 99 38 - 126 U/L   Total Bilirubin 0.9 0.3 - 1.2 mg/dL   GFR, Estimated 20 (L) >60 mL/min    Comment: (NOTE) Calculated using the CKD-EPI Creatinine Equation (2021)    Anion gap 20 (H) 5 - 15    Comment: Performed at [facility], [address] [place], [place] [address]  Lipase, blood     Status: Abnormal   Collection Time: 04/10/22  4:05 AM  Result Value Ref Range   Lipase 2,291 (H) 11 - 51 U/L    Comment: RESULTS CONFIRMED BY MANUAL DILUTION Performed at [facility], [address], [place], [place] [address]   Lactic acid, plasma     Status: Abnormal   Collection Time: 04/10/22  4:05 AM  Result Value Ref Range   Lactic Acid, Venous 5.1 (HH) 0.5 - 1.9 mmol/L    Comment: CRITICAL RESULT CALLED TO, READ BACK BY AND VERIFIED WITH: TURNER,C @ 9147 ON 04/10/22 BY JUW Performed at [facility], [address], [place], [place] [address]   Culture, blood (Routine X 2) w Reflex to ID Panel     Status: None (Preliminary result)   Collection Time: 04/10/22  4:05 AM   Specimen: Left Antecubital; Blood  Result Value Ref Range   Specimen Description      LEFT ANTECUBITAL BOTTLES DRAWN AEROBIC AND ANAEROBIC   Special Requests      Blood Culture adequate volume Performed at [facility], [address] [place], [place] [address]    Culture PENDING    Report Status PENDING   Blood gas, venous     Status: Abnormal   Collection Time: 04/10/22  4:59 AM  Result Value Ref Range   pH, Ven 7.19 (LL) 7.25 - 7.43    Comment: CRITICAL RESULT CALLED TO, READ BACK BY AND VERIFIED WITH: Daryl Quiros,B ON 04/10/2022 @ 0630 BY ACOSTA,A    pCO2, Ven 44 44 - 60 mmHg   pO2, Ven 47 (H) 32 - 45 mmHg   Bicarbonate 16.8 (L) 20.0 - 28.0 mmol/L   Acid-base deficit 11.3 (H) 0.0 - 2.0 mmol/L   O2 Saturation 71.7 %   Patient  temperature 36.4    Collection site BLOOD RIGHT FOREARM    Drawn by [id number]     Comment: Performed at [facility], [address], [place], [place] [address]  CBG monitoring, ED     Status: Abnormal   Collection Time: 04/10/22  5:25 AM  Result Value Ref Range   Glucose-Capillary >600 (HH) 70 - 99 mg/dL    Comment: Glucose reference range applies only to samples taken after fasting for at least 8 hours.   CT ABDOMEN PELVIS WO CONTRAST  Result Date: 04/10/2022 CLINICAL DATA:  Abdominal pain EXAM: CT ABDOMEN AND PELVIS WITHOUT CONTRAST TECHNIQUE: Multidetector CT imaging of the abdomen and pelvis was performed following the standard protocol without IV contrast. RADIATION DOSE REDUCTION: This exam was performed according to the departmental dose-optimization program which includes automated exposure control, adjustment of the mA and/or kV according to patient size and/or use of iterative reconstruction technique. COMPARISON:  None Available. FINDINGS: Lower chest: No acute abnormality. Hepatobiliary: No focal liver abnormality is seen. Status post cholecystectomy. No biliary dilatation. Pancreas: Signs of acute pancreatitis identified. There is marked diffuse pancreatic edema with peripancreatic fat stranding and fluid. No main duct dilatation or signs early pseudocyst formation. Spleen: Normal in size without focal abnormality. Adrenals/Urinary Tract: Adrenal glands are unremarkable. Kidneys are normal, without renal calculi, focal lesion, or hydronephrosis. Bladder is unremarkable. Stomach/Bowel: Stomach is normal. The appendix is visualized and appears normal. No bowel wall thickening, inflammation, or distension. Vascular/Lymphatic: Aortic atherosclerosis. No abdominopelvic adenopathy. Reproductive: Prostate is unremarkable. Other: No discrete fluid collections identified. No signs of pneumoperitoneum. Musculoskeletal: Status post bilateral hip arthroplasty. No acute or suspicious osseous findings.  IMPRESSION: 1. Imaging findings compatible with acute pancreatitis. 2. No signs of main duct dilatation or early pseudocyst formation.  3. Status post cholecystectomy. Electronically Signed   By: Kerby Moors M.D.   On: 04/10/2022 05:25   DG Chest Port 1 View  Result Date: 04/10/2022 CLINICAL DATA:  Hypoxia EXAM: PORTABLE CHEST 1 VIEW COMPARISON:  03/25/2021 FINDINGS: Opacity in the left upper lobe with central lucency. Chronic interstitial coarsening. Normal heart size and mediastinal contours for technique. Extrapleural thickening bilaterally, likely extrapleural fat based on most recent CT. IMPRESSION: Thick walled bulla in the left upper lobe which are known from 03/12/2022 chest CT. No new abnormality. Electronically Signed   By: Jorje Guild M.D.   On: 04/10/2022 04:20    Pending Labs Unresulted Labs (From admission, onward)     Start     Ordered   04/10/22 0534  Lactic acid, plasma  ONCE - STAT,   STAT        04/10/22 0533   04/10/22 0093  Basic metabolic panel  (Diabetes Ketoacidosis (DKA))  STAT Now then every 4 hours ,   STAT      04/10/22 0459   04/10/22 0459  Beta-hydroxybutyric acid  (Diabetes Ketoacidosis (DKA))  Now then every 8 hours,   STAT (with URGENT occurrences)      04/10/22 0459   04/10/22 0459  Urinalysis, Routine w reflex microscopic  (Diabetes Ketoacidosis (DKA))  ONCE - STAT,   URGENT        04/10/22 0459   04/10/22 0405  Culture, blood (Routine X 2) w Reflex to ID Panel  BLOOD CULTURE X 2,   R (with STAT occurrences)      04/10/22 0405   Signed and Held  HIV Antibody (routine testing w rflx)  (HIV Antibody (Routine testing w reflex) panel)  Once,   R        Signed and Held   Signed and Held  Comprehensive metabolic panel  Tomorrow morning,   R        Signed and Held   Signed and Held  Magnesium  Tomorrow morning,   R        Signed and Held   Signed and Held  CBC with Differential/Platelet  Tomorrow morning,   R        Signed and Held             Vitals/Pain Today's Vitals   04/10/22 0430 04/10/22 0445 04/10/22 0551 04/10/22 0558  BP:    98/63  Pulse:    (!) 115  Resp:      Temp:      TempSrc:      SpO2: 95% 98%  95%  Weight:      Height:      PainSc:   7      Isolation Precautions No active isolations  Medications Medications  insulin regular, human (MYXREDLIN) 100 units/ 100 mL infusion (11.5 Units/hr Intravenous New Bag/Given 04/10/22 0617)  lactated ringers infusion (has no administration in time range)  dextrose 5 % in lactated ringers infusion (has no administration in time range)  dextrose 50 % solution 0-50 mL (has no administration in time range)  acetaminophen (TYLENOL) tablet 650 mg (has no administration in time range)  oxyCODONE (Oxy IR/ROXICODONE) immediate release tablet 5 mg (has no administration in time range)  morphine (PF) 2 MG/ML injection 2 mg (has no administration in time range)  ondansetron (ZOFRAN) injection 4 mg (has no administration in time range)  midodrine (PROAMATINE) tablet 10 mg (has no administration in time range)  0.9 %  sodium chloride infusion (has  no administration in time range)  norepinephrine (LEVOPHED) '4mg'$  in 27m (0.016 mg/mL) premix infusion (has no administration in time range)  albuterol (PROVENTIL) (2.5 MG/3ML) 0.083% nebulizer solution 2.5 mg (has no administration in time range)  sodium chloride 0.9 % bolus 1,000 mL (1,000 mLs Intravenous New Bag/Given 04/10/22 0413)  ondansetron (ZOFRAN) injection 4 mg (4 mg Intravenous Given 04/10/22 0414)  HYDROmorphone (DILAUDID) injection 1 mg (1 mg Intravenous Given 04/10/22 0414)  piperacillin-tazobactam (ZOSYN) IVPB 3.375 g (0 g Intravenous Stopped 04/10/22 0618)  lactated ringers bolus 1,000 mL (1,000 mLs Intravenous New Bag/Given 04/10/22 0551)  insulin aspart (novoLOG) injection 10 Units (10 Units Intravenous Given 04/10/22 0545)  lactated ringers bolus 1,000 mL (1,000 mLs Intravenous New Bag/Given 04/10/22 0545)  HYDROmorphone  (DILAUDID) injection 1 mg (1 mg Intravenous Given 04/10/22 0602)    Mobility walks with device Low fall risk   Focused Assessments    R Recommendations: See Admitting Provider Note  Report given to:   Additional Notes:

## 2022-04-10 NOTE — Progress Notes (Signed)
Sepsis protocol d/c'd before 6 hr window complete.

## 2022-04-10 NOTE — Assessment & Plan Note (Addendum)
-   Protonix IV for critical illness for GI protection

## 2022-04-10 NOTE — Sepsis Progress Note (Signed)
Following for sepsis monitoring ?

## 2022-04-10 NOTE — Progress Notes (Signed)
Patient started on BiPAP. Patient normally wears BiPAP at night 16/10 by sleep study. Nasal mask. Patient is breathing Is Tachypnea at 40 . Placed on BiPAP 16/8 f 26 60 percent. Medium mask. Hopefully can tolerate.

## 2022-04-10 NOTE — ED Triage Notes (Signed)
Pt presents to ED with generalized abd pain that started last night. Pt O2 sats 88% on room air, pt uses C pap at night and hx of COPD. pt diaphoretic, and has increased generalized weakness. Pt reports last BM was "a day or two ago"

## 2022-04-10 NOTE — Assessment & Plan Note (Addendum)
-   secondary to critical illness -he remains on nasal cannula - low threshold to intubate if he decompensates

## 2022-04-10 NOTE — ED Provider Notes (Signed)
[facility] Emergency Department Provider Note MRN:  [id number]  Arrival date & time: 04/10/22     Chief Complaint   Abdominal Pain   History of Present Illness   Mathew Lara is a 61 y.o. year-old male with a history of COPD, CKD presenting to the ED with chief complaint of abdominal pain.  Worsening diffuse abdominal pain and bloating over the past 2 or 3 days.  Not eating well.  Not having bowel movements.  No fever.  No chest pain.  Review of Systems  A thorough review of systems was obtained and all systems are negative except as noted in the HPI and PMH.   Patient's Health History    Past Medical History:  Diagnosis Date   Alcohol abuse    Anxiety    Bronchitis    Chronic hip pain    CKD (chronic kidney disease) stage 3, GFR 30-59 ml/min (HCC)    COPD (chronic obstructive pulmonary disease) ([place])    Essential hypertension    Hepatitis C    Mixed hyperlipidemia    Sleep apnea     Past Surgical History:  Procedure Laterality Date   BALLOON DILATION N/A 07/06/2021   Procedure: BALLOON DILATION;  Surgeon: [name], DO;  [facility];  Service: Endoscopy;  Laterality: N/A;   BIOPSY  07/06/2021   Procedure: BIOPSY;  Surgeon: [name], DO;  [facility];  Service: Endoscopy;;   CATARACT EXTRACTION Right    CHOLECYSTECTOMY N/A 03/23/2021   Procedure: LAPAROSCOPIC CHOLECYSTECTOMY;  Surgeon: [name], MD;  Location: [facility] ORS;  Service: General;  Laterality: N/A;   COLONOSCOPY N/A 01/23/2014   SLF:The LEFT COLON IS EXTREMELY redundant/TWO RECTAL POLYPS REMOVED/SMALL VOLUME RECTAL BLEEDING MOST LIKELY DUE TO Small internal hemorrhoids. path with prolapse type polyp of benign-mucosa. no adenomas   COLONOSCOPY WITH PROPOFOL N/A 07/06/2021   non-bleeding internal hemorrhoids, one 8 mm polyp in sigmod s/p removal and clip placed. One 5 mm polyp at recto-sigmoid s/p removal. 3 year surveillance. Tubular  adenomas   ESOPHAGOGASTRODUODENOSCOPY N/A 01/23/2014   [facility]:ENIDPOEU ring at the gastroesophageal junction/Medium sized hiatal hernia/NDYSPEPSIA MOST LIKELY DUE TO GERD/MILD Non-erosive gastritis   ESOPHAGOGASTRODUODENOSCOPY N/A 04/18/2018   food impaction, small hiatal hernia   ESOPHAGOGASTRODUODENOSCOPY (EGD) WITH PROPOFOL N/A 07/06/2021   moderate Schatzki ring s/p dilation, gastritis s/p biopsy, normal duodenum. Negative H.pylori.   HEMORRHOID BANDING  01/23/2014   Procedure: HEMORRHOID BANDING;  Surgeon: Danie Binder, MD;  Location: [facility];  Service: Endoscopy;;   JOINT REPLACEMENT Right 2017   LIVER BIOPSY N/A 03/23/2021   Procedure: LAPAROSCOPIC LIVER BIOPSY;  Surgeon: [name], MD;  Location: [facility] ORS;  Service: General;  Laterality: N/A;   MOUTH SURGERY     POLYPECTOMY  07/06/2021   Procedure: POLYPECTOMY;  Surgeon: [name], DO;  Location: [facility];  Service: Endoscopy;;   TOTAL HIP ARTHROPLASTY Right 06/04/2016   Procedure: RIGHT TOTAL HIP ARTHROPLASTY ANTERIOR APPROACH;  Surgeon: Mcarthur Rossetti, MD;  Location: [facility] ORS;  Service: Orthopedics;  Laterality: Right;   TOTAL HIP ARTHROPLASTY Left 03/02/2019   Procedure: LEFT TOTAL HIP ARTHROPLASTY ANTERIOR APPROACH;  Surgeon: Mcarthur Rossetti, MD;  Location: [facility] ORS;  Service: Orthopedics;  Laterality: Left;    Family History  Problem Relation Age of Onset   Cancer Mother    Colon cancer Neg Hx    Colon polyps Neg Hx     Social History  Socioeconomic History   Marital status: Single    Spouse name: Not on file   Number of children: 1   Years of education: 10   Highest education level: Not on file  Occupational History    Comment: [employer]  Tobacco Use   Smoking status: Former    Packs/day: 1.00    Years: 42.00    Total pack years: 42.00    Types: Cigarettes    Start date: 09/07/1971    Quit date: 05/28/2014    Years since quitting: 7.8    Passive exposure: Past    Smokeless tobacco: Never   Tobacco comments:    quit in November 2015 after smoking x 20 yrs.  Vaping Use   Vaping Use: Never used  Substance and Sexual Activity   Alcohol use: Not Currently    Alcohol/week: 0.0 standard drinks of alcohol   Drug use: No   Sexual activity: Not on file  Other Topics Concern   Not on file  Social History Narrative       patient consumes 4 cups of caffeine daily.      Patient stopped drinking years ago.   Social Determinants of Health   Financial Resource Strain: Not on file  Food Insecurity: Not on file  Transportation Needs: Not on file  Physical Activity: Not on file  Stress: Not on file  Social Connections: Not on file  Intimate Partner Violence: Not on file     Physical Exam   Vitals:   04/10/22 0430 04/10/22 0445  BP:    Pulse:    Resp:    Temp:    SpO2: 95% 98%    CONSTITUTIONAL: Well-appearing, NAD NEURO/PSYCH:  Alert and oriented x 3, no focal deficits EYES:  eyes equal and reactive ENT/NECK:  no LAD, no JVD CARDIO: Tachycardic rate, well-perfused, normal S1 and S2 PULM:  CTAB no wheezing or rhonchi GI/GU:  non-distended, non-tender MSK/SPINE:  No gross deformities, no edema SKIN:  no rash, atraumatic   *Additional and/or pertinent findings included in MDM below  Diagnostic and Interventional Summary    EKG Interpretation  Date/Time:  Saturday April 10 2022 03:52:37 EDT Ventricular Rate:  115 PR Interval:  173 QRS Duration: 92 QT Interval:  346 QTC Calculation: 479 R Axis:   44 Text Interpretation: Sinus tachycardia Borderline prolonged QT interval Confirmed by Gerlene Fee [phone]) on 04/10/2022 5:35:22 AM       Labs Reviewed  CBC - Abnormal; Notable for the following components:      Result Value   WBC 14.0 (*)    RDW 15.9 (*)    All other components within normal limits  COMPREHENSIVE METABOLIC PANEL - Abnormal; Notable for the following components:   Sodium 125 (*)    Chloride 89 (*)    CO2 16 (*)     Glucose, Bld 774 (*)    BUN 44 (*)    Creatinine, Ser 3.33 (*)    Total Protein 9.4 (*)    GFR, Estimated 20 (*)    Anion gap 20 (*)    All other components within normal limits  LACTIC ACID, PLASMA - Abnormal; Notable for the following components:   Lactic Acid, Venous 5.1 (*)    All other components within normal limits  CBG MONITORING, ED - Abnormal; Notable for the following components:   Glucose-Capillary >600 (*)    All other components within normal limits  CULTURE, BLOOD (ROUTINE X 2)  CULTURE, BLOOD (ROUTINE X 2)  LIPASE,  BLOOD  BASIC METABOLIC PANEL  BASIC METABOLIC PANEL  BASIC METABOLIC PANEL  BASIC METABOLIC PANEL  BASIC METABOLIC PANEL  BETA-HYDROXYBUTYRIC ACID  BETA-HYDROXYBUTYRIC ACID  BETA-HYDROXYBUTYRIC ACID  URINALYSIS, ROUTINE W REFLEX MICROSCOPIC  BLOOD GAS, VENOUS  LACTIC ACID, PLASMA    CT ABDOMEN PELVIS WO CONTRAST  Final Result    DG Chest Port 1 View  Final Result      Medications  piperacillin-tazobactam (ZOSYN) IVPB 3.375 g (has no administration in time range)  lactated ringers bolus 1,000 mL (has no administration in time range)  insulin aspart (novoLOG) injection 10 Units (has no administration in time range)  insulin regular, human (MYXREDLIN) 100 units/ 100 mL infusion (has no administration in time range)  lactated ringers infusion (has no administration in time range)  dextrose 5 % in lactated ringers infusion (has no administration in time range)  dextrose 50 % solution 0-50 mL (has no administration in time range)  lactated ringers bolus 1,000 mL (has no administration in time range)  sodium chloride 0.9 % bolus 1,000 mL (1,000 mLs Intravenous New Bag/Given 04/10/22 0413)  ondansetron (ZOFRAN) injection 4 mg (4 mg Intravenous Given 04/10/22 0414)  HYDROmorphone (DILAUDID) injection 1 mg (1 mg Intravenous Given 04/10/22 0414)     Procedures  /  Critical Care .Critical Care  Performed by: Maudie Flakes, MD Authorized by: Maudie Flakes, MD   Critical care provider statement:    Critical care time (minutes):  45   Critical care was necessary to treat or prevent imminent or life-threatening deterioration of the following conditions:  Sepsis   Critical care was time spent personally by me on the following activities:  Development of treatment plan with patient or surrogate, discussions with consultants, evaluation of patient's response to treatment, examination of patient, ordering and review of laboratory studies, ordering and review of radiographic studies, ordering and performing treatments and interventions, pulse oximetry, re-evaluation of patient's condition and review of old charts   ED Course and Medical Decision Making  Initial Impression and Ddx Patient arriving with diffuse abdominal pain and bloating.  Tachycardic, soft blood pressure.  Differential diagnosis includes SBO, intra-abdominal sepsis, perforation.  Afebrile, providing fluids, awaiting labs, CT imaging.  Past medical/surgical history that increases complexity of ED encounter: CKD  Interpretation of Diagnostics I personally reviewed the EKG and my interpretation is as follows: Sinus rhythm  Labs review lactic acidosis, significant hyperglycemia, leukocytosis.  Lipase pending.  CT showing evidence of acute pancreatitis.  Patient Reassessment and Ultimate Disposition/Management     Providing fluids, insulin, pain control, will admit to medicine.  Patient management required discussion with the following services or consulting groups:  Hospitalist Service  Complexity of Problems Addressed Acute illness or injury that poses threat of life of bodily function  Additional Data Reviewed and Analyzed Further history obtained from: Further history from spouse/family member  Additional Factors Impacting ED Encounter Risk Use of parenteral controlled substances and Consideration of hospitalization  Barth Kirks. [place] Small, [place] [email or web address]  Final Clinical Impressions(s) / ED Diagnoses     ICD-10-CM   1. Acute pancreatitis, unspecified complication status, unspecified pancreatitis type  K85.90       ED Discharge Orders     None        Discharge Instructions Discussed with and Provided to Patient:   Discharge Instructions   None      Maudie Flakes, MD 04/10/22 [phone]

## 2022-04-10 NOTE — Assessment & Plan Note (Signed)
-   Continue albuterol as needed -VBG shows PCO2 of 44 -No wheeze, not clinically in exacerbation-respiratory failure at this time is most likely secondary to the acidosis

## 2022-04-11 ENCOUNTER — Inpatient Hospital Stay (HOSPITAL_COMMUNITY): Payer: Medicare Other

## 2022-04-11 DIAGNOSIS — R579 Shock, unspecified: Secondary | ICD-10-CM | POA: Diagnosis not present

## 2022-04-11 DIAGNOSIS — E1110 Type 2 diabetes mellitus with ketoacidosis without coma: Secondary | ICD-10-CM | POA: Diagnosis not present

## 2022-04-11 DIAGNOSIS — K859 Acute pancreatitis without necrosis or infection, unspecified: Secondary | ICD-10-CM | POA: Diagnosis not present

## 2022-04-11 LAB — BASIC METABOLIC PANEL
Anion gap: 11 (ref 5–15)
Anion gap: 10 (ref 5–15)
Anion gap: 10 (ref 5–15)
Anion gap: 9 (ref 5–15)
Anion gap: 12 (ref 5–15)
BUN: 40 mg/dL — ABNORMAL HIGH (ref 8–23)
BUN: 42 mg/dL — ABNORMAL HIGH (ref 8–23)
BUN: 43 mg/dL — ABNORMAL HIGH (ref 8–23)
BUN: 43 mg/dL — ABNORMAL HIGH (ref 8–23)
BUN: 44 mg/dL — ABNORMAL HIGH (ref 8–23)
CO2: 19 mmol/L — ABNORMAL LOW (ref 22–32)
CO2: 17 mmol/L — ABNORMAL LOW (ref 22–32)
CO2: 19 mmol/L — ABNORMAL LOW (ref 22–32)
CO2: 19 mmol/L — ABNORMAL LOW (ref 22–32)
CO2: 18 mmol/L — ABNORMAL LOW (ref 22–32)
Calcium: 7.9 mg/dL — ABNORMAL LOW (ref 8.9–10.3)
Calcium: 7.1 mg/dL — ABNORMAL LOW (ref 8.9–10.3)
Calcium: 7.4 mg/dL — ABNORMAL LOW (ref 8.9–10.3)
Calcium: 7.4 mg/dL — ABNORMAL LOW (ref 8.9–10.3)
Calcium: 7.5 mg/dL — ABNORMAL LOW (ref 8.9–10.3)
Chloride: 105 mmol/L (ref 98–111)
Chloride: 108 mmol/L (ref 98–111)
Chloride: 106 mmol/L (ref 98–111)
Chloride: 106 mmol/L (ref 98–111)
Chloride: 105 mmol/L (ref 98–111)
Creatinine, Ser: 4.69 mg/dL — ABNORMAL HIGH (ref 0.61–1.24)
Creatinine, Ser: 4.56 mg/dL — ABNORMAL HIGH (ref 0.61–1.24)
Creatinine, Ser: 5.21 mg/dL — ABNORMAL HIGH (ref 0.61–1.24)
Creatinine, Ser: 5.26 mg/dL — ABNORMAL HIGH (ref 0.61–1.24)
Creatinine, Ser: 5.37 mg/dL — ABNORMAL HIGH (ref 0.61–1.24)
GFR, Estimated: 13 mL/min — ABNORMAL LOW (ref >60)
GFR, Estimated: 14 mL/min — ABNORMAL LOW (ref >60)
GFR, Estimated: 12 mL/min — ABNORMAL LOW (ref >60)
GFR, Estimated: 12 mL/min — ABNORMAL LOW (ref >60)
GFR, Estimated: 11 mL/min — ABNORMAL LOW (ref >60)
Glucose, Bld: 214 mg/dL — ABNORMAL HIGH (ref 70–99)
Glucose, Bld: 167 mg/dL — ABNORMAL HIGH (ref 70–99)
Glucose, Bld: 115 mg/dL — ABNORMAL HIGH (ref 70–99)
Glucose, Bld: 166 mg/dL — ABNORMAL HIGH (ref 70–99)
Glucose, Bld: 178 mg/dL — ABNORMAL HIGH (ref 70–99)
Potassium: 5.1 mmol/L (ref 3.5–5.1)
Potassium: 5.1 mmol/L (ref 3.5–5.1)
Potassium: 4.1 mmol/L (ref 3.5–5.1)
Potassium: 4.4 mmol/L (ref 3.5–5.1)
Potassium: 4.4 mmol/L (ref 3.5–5.1)
Sodium: 135 mmol/L (ref 135–145)
Sodium: 135 mmol/L (ref 135–145)
Sodium: 135 mmol/L (ref 135–145)
Sodium: 134 mmol/L — ABNORMAL LOW (ref 135–145)
Sodium: 135 mmol/L (ref 135–145)

## 2022-04-11 LAB — POCT I-STAT, CHEM 8
BUN: 39 mg/dL — ABNORMAL HIGH (ref 8–23)
Calcium, Ion: 1.07 mmol/L — ABNORMAL LOW (ref 1.15–1.40)
Chloride: 108 mmol/L (ref 98–111)
Creatinine, Ser: 5.3 mg/dL — ABNORMAL HIGH (ref 0.61–1.24)
Glucose, Bld: 179 mg/dL — ABNORMAL HIGH (ref 70–99)
HCT: 45 % (ref 39.0–52.0)
Hemoglobin: 15.3 g/dL (ref 13.0–17.0)
Potassium: 4.3 mmol/L (ref 3.5–5.1)
Sodium: 139 mmol/L (ref 135–145)
TCO2: 19 mmol/L — ABNORMAL LOW (ref 22–32)

## 2022-04-11 LAB — CBC WITH DIFFERENTIAL/PLATELET
Basophils Absolute: 0.2 10*3/uL — ABNORMAL HIGH (ref 0.0–0.1)
Basophils Relative: 1 %
Eosinophils Absolute: 0 10*3/uL (ref 0.0–0.5)
Eosinophils Relative: 0 %
HCT: 43.5 % (ref 39.0–52.0)
Hemoglobin: 14.1 g/dL (ref 13.0–17.0)
Lymphocytes Relative: 6 %
Lymphs Abs: 1.2 10*3/uL (ref 0.7–4.0)
MCH: 26.6 pg (ref 26.0–34.0)
MCHC: 32.4 g/dL (ref 30.0–36.0)
MCV: 81.9 fL (ref 80.0–100.0)
Monocytes Absolute: 0.8 10*3/uL (ref 0.1–1.0)
Monocytes Relative: 4 %
Neutro Abs: 17.4 10*3/uL — ABNORMAL HIGH (ref 1.7–7.7)
Neutrophils Relative %: 89 %
Platelets: 275 10*3/uL (ref 150–400)
RBC: 5.31 MIL/uL (ref 4.22–5.81)
RDW: 15.9 % — ABNORMAL HIGH (ref 11.5–15.5)
WBC: 19.5 10*3/uL — ABNORMAL HIGH (ref 4.0–10.5)
nRBC: 0 % (ref 0.0–0.2)

## 2022-04-11 LAB — GLUCOSE, CAPILLARY
Glucose-Capillary: 114 mg/dL — ABNORMAL HIGH (ref 70–99)
Glucose-Capillary: 122 mg/dL — ABNORMAL HIGH (ref 70–99)
Glucose-Capillary: 133 mg/dL — ABNORMAL HIGH (ref 70–99)
Glucose-Capillary: 136 mg/dL — ABNORMAL HIGH (ref 70–99)
Glucose-Capillary: 140 mg/dL — ABNORMAL HIGH (ref 70–99)
Glucose-Capillary: 154 mg/dL — ABNORMAL HIGH (ref 70–99)
Glucose-Capillary: 155 mg/dL — ABNORMAL HIGH (ref 70–99)
Glucose-Capillary: 162 mg/dL — ABNORMAL HIGH (ref 70–99)
Glucose-Capillary: 163 mg/dL — ABNORMAL HIGH (ref 70–99)
Glucose-Capillary: 163 mg/dL — ABNORMAL HIGH (ref 70–99)
Glucose-Capillary: 165 mg/dL — ABNORMAL HIGH (ref 70–99)
Glucose-Capillary: 168 mg/dL — ABNORMAL HIGH (ref 70–99)
Glucose-Capillary: 172 mg/dL — ABNORMAL HIGH (ref 70–99)
Glucose-Capillary: 173 mg/dL — ABNORMAL HIGH (ref 70–99)
Glucose-Capillary: 179 mg/dL — ABNORMAL HIGH (ref 70–99)
Glucose-Capillary: 180 mg/dL — ABNORMAL HIGH (ref 70–99)
Glucose-Capillary: 183 mg/dL — ABNORMAL HIGH (ref 70–99)
Glucose-Capillary: 185 mg/dL — ABNORMAL HIGH (ref 70–99)
Glucose-Capillary: 194 mg/dL — ABNORMAL HIGH (ref 70–99)
Glucose-Capillary: 194 mg/dL — ABNORMAL HIGH (ref 70–99)
Glucose-Capillary: 197 mg/dL — ABNORMAL HIGH (ref 70–99)
Glucose-Capillary: 98 mg/dL (ref 70–99)

## 2022-04-11 LAB — TRIGLYCERIDES
Triglycerides: 971 mg/dL — ABNORMAL HIGH (ref <150)
Triglycerides: 714 mg/dL — ABNORMAL HIGH (ref <150)
Triglycerides: 750 mg/dL — ABNORMAL HIGH (ref <150)
Triglycerides: 666 mg/dL — ABNORMAL HIGH (ref <150)

## 2022-04-11 LAB — PROCALCITONIN: Procalcitonin: 10.42 ng/mL

## 2022-04-11 LAB — LACTIC ACID, PLASMA
Lactic Acid, Venous: 2.4 mmol/L (ref 0.5–1.9)
Lactic Acid, Venous: 2.6 mmol/L (ref 0.5–1.9)
Lactic Acid, Venous: 1.5 mmol/L (ref 0.5–1.9)

## 2022-04-11 LAB — MAGNESIUM: Magnesium: 1.8 mg/dL (ref 1.7–2.4)

## 2022-04-11 LAB — HIV ANTIBODY (ROUTINE TESTING W REFLEX): HIV Screen 4th Generation wRfx: NONREACTIVE

## 2022-04-11 MED ORDER — STERILE WATER FOR INJECTION IJ SOLN
INTRAMUSCULAR | Status: AC
  Filled 2022-04-11: qty 10

## 2022-04-11 MED ORDER — BISACODYL 10 MG RE SUPP
10.0000 mg | Freq: Every day | RECTAL | Status: DC | PRN
  Administered 2022-04-16: 10 mg via RECTAL
  Filled 2022-04-11 (×3): qty 1

## 2022-04-11 MED ORDER — MOMETASONE FURO-FORMOTEROL FUM 100-5 MCG/ACT IN AERO
2.0000 | INHALATION_SPRAY | Freq: Two times a day (BID) | RESPIRATORY_TRACT | Status: DC
  Administered 2022-04-11: 2 via RESPIRATORY_TRACT
  Filled 2022-04-11: qty 8.8

## 2022-04-11 MED ORDER — LACTATED RINGERS IV BOLUS
1000.0000 mL | Freq: Once | INTRAVENOUS | Status: AC
Start: 2022-04-11 — End: 2022-04-11
  Administered 2022-04-11: 1000 mL via INTRAVENOUS

## 2022-04-11 MED ORDER — NOREPINEPHRINE 16 MG/250ML-% IV SOLN
0.0000 ug/min | INTRAVENOUS | Status: DC
  Administered 2022-04-11: 11 ug/min via INTRAVENOUS
  Administered 2022-04-14: 5 ug/min via INTRAVENOUS
  Filled 2022-04-11 (×2): qty 250

## 2022-04-11 MED ORDER — DEXTROSE 5 % IV SOLN: INTRAVENOUS | Status: DC

## 2022-04-11 MED ORDER — NITROGLYCERIN 2 % TD OINT
1.0000 [in_us] | TOPICAL_OINTMENT | Freq: Three times a day (TID) | TRANSDERMAL | Status: DC
  Filled 2022-04-11: qty 30

## 2022-04-11 MED ORDER — ALBUMIN HUMAN 5 % IV SOLN
25.0000 g | Freq: Once | INTRAVENOUS | Status: AC
  Administered 2022-04-11: 25 g via INTRAVENOUS
  Filled 2022-04-11: qty 500

## 2022-04-11 MED ORDER — INSULIN GLARGINE-YFGN 100 UNIT/ML ~~LOC~~ SOLN
15.0000 [IU] | Freq: Every day | SUBCUTANEOUS | Status: DC
  Filled 2022-04-11: qty 0.15

## 2022-04-11 MED ORDER — BUDESONIDE 0.5 MG/2ML IN SUSP
0.5000 mg | Freq: Two times a day (BID) | RESPIRATORY_TRACT | Status: DC
  Administered 2022-04-11 – 2022-05-03 (×45): 0.5 mg via RESPIRATORY_TRACT
  Filled 2022-04-11 (×45): qty 2

## 2022-04-11 MED ORDER — ARFORMOTEROL TARTRATE 15 MCG/2ML IN NEBU
15.0000 ug | INHALATION_SOLUTION | Freq: Two times a day (BID) | RESPIRATORY_TRACT | Status: DC
  Administered 2022-04-11 – 2022-05-14 (×63): 15 ug via RESPIRATORY_TRACT
  Filled 2022-04-11 (×65): qty 2

## 2022-04-11 MED ORDER — NOREPINEPHRINE 4 MG/250ML-% IV SOLN
0.0000 ug/min | INTRAVENOUS | Status: DC
  Administered 2022-04-11: 12 ug/min via INTRAVENOUS

## 2022-04-11 MED ORDER — MAGNESIUM SULFATE 2 GM/50ML IV SOLN
2.0000 g | Freq: Once | INTRAVENOUS | Status: AC
  Administered 2022-04-11: 2 g via INTRAVENOUS
  Filled 2022-04-11: qty 50

## 2022-04-11 MED ORDER — INSULIN GLARGINE-YFGN 100 UNIT/ML ~~LOC~~ SOLN
35.0000 [IU] | Freq: Every day | SUBCUTANEOUS | Status: DC
  Filled 2022-04-11: qty 0.35

## 2022-04-11 MED ORDER — WHITE PETROLATUM EX OINT
TOPICAL_OINTMENT | CUTANEOUS | Status: DC | PRN
  Filled 2022-04-11 (×3): qty 28.35

## 2022-04-11 MED ORDER — CALCIUM GLUCONATE-NACL 1-0.675 GM/50ML-% IV SOLN
1.0000 g | Freq: Once | INTRAVENOUS | Status: AC
  Administered 2022-04-11: 1000 mg via INTRAVENOUS
  Filled 2022-04-11 (×2): qty 50

## 2022-04-11 MED ORDER — DEXTROSE 10 % IV SOLN: INTRAVENOUS | Status: DC

## 2022-04-11 MED ORDER — INSULIN (MYXREDLIN) INFUSION FOR HYPERTRIGLYCERIDEMIA
0.1000 [IU]/kg/h | INTRAVENOUS | Status: DC
  Administered 2022-04-11: 0.2 [IU]/kg/h via INTRAVENOUS
  Administered 2022-04-11: 0.1 [IU]/kg/h via INTRAVENOUS
  Administered 2022-04-11 (×2): 0.2 [IU]/kg/h via INTRAVENOUS
  Administered 2022-04-11 – 2022-04-12 (×4): 0.1 [IU]/kg/h via INTRAVENOUS
  Filled 2022-04-11 (×7): qty 100

## 2022-04-11 MED ORDER — ALBUMIN HUMAN 25 % IV SOLN
12.5000 g | Freq: Once | INTRAVENOUS | Status: AC
  Administered 2022-04-11: 12.5 g via INTRAVENOUS
  Filled 2022-04-11: qty 50

## 2022-04-11 MED ORDER — FENTANYL CITRATE PF 50 MCG/ML IJ SOSY
12.5000 ug | PREFILLED_SYRINGE | INTRAMUSCULAR | Status: DC | PRN
  Administered 2022-04-12 – 2022-04-17 (×4): 12.5 ug via INTRAVENOUS
  Filled 2022-04-11 (×4): qty 1

## 2022-04-11 MED ORDER — PHENTOLAMINE MESYLATE 5 MG IJ SOLR
5.0000 mg | Freq: Once | INTRAMUSCULAR | Status: AC
  Administered 2022-04-11: 5 mg via SUBCUTANEOUS
  Filled 2022-04-11: qty 5

## 2022-04-11 NOTE — Progress Notes (Signed)
Date and time results received: 04/11/22 0518 (use smartphrase ".now" to insert current time)  Test: lactic acid Critical Value: 2.4  Name of Provider Notified: Lucile Shutters, MD  Orders Received? Or Actions Taken?:  awaiting orders

## 2022-04-11 NOTE — Progress Notes (Signed)
[facility] Progress Note Patient Name: Mathew Lara DOB: 28-Jun-1961 MRN: [id number]   Date of Service  04/11/2022  HPI/Events of Note  Patient admitted with acute pancreatitis, DKA, and acute kidney injury.  eICU Interventions  New Patient Evaluation.        Kerry Kass Cammie Faulstich 04/11/2022, 2:39 AM

## 2022-04-11 NOTE — H&P (Addendum)
NAME:  Mathew Lara, MRN:  [id number], DOB:  04-Aug-1961, LOS: 1 ADMISSION DATE:  04/10/2022, CONSULTATION DATE:  8/5 REFERRING MD:  Darrell Jewel, CHIEF COMPLAINT: Abdominal pain  History of Present Illness:  Mathew Lara is an 61 y.o. M who presented to the [facility] ED on 8/5 with a chief complaint of abdominal pain.  They have a past medical history of alcohol abuse, anxiety, CKD 3, COPD, OSA, type 2 diabetes, hypertension, hyperlipidemia, hep C  ED work-up revealed DKA, acute pancreatitis, AKI.  He was admitted to the hospital service.  Overnight he developed continuing respiratory distress, shock.   PCCM was consulted for transfer to [facility] for further management.  Pertinent  Medical History  Alcohol abuse, anxiety, CKD 3, COPD, OSA, type 2 diabetes, hypertension, hyperlipidemia, hep C  Significant Hospital Events: Including procedures, antibiotic start and stop dates in addition to other pertinent events   8/5 presented to [facility] ED, transferred [facility],   Interim History / Subjective:  See above  Levophed 10, D5LR at 150, insulin drip  Subjective: Denies chest pain, denies shortness of breath, endorses abdominal pain  Objective   Blood pressure 96/71, pulse (!) 127, temperature 98.3 F (36.8 C), temperature source Oral, resp. rate (!) 24, height '5\' 8"'$  (1.727 m), weight 134.5 kg, SpO2 100 %.        Intake/Output Summary (Last 24 hours) at 04/11/2022 0258 Last data filed at 04/10/2022 1858 Gross per 24 hour  Intake 2391.26 ml  Output 850 ml  Net 1541.26 ml   Filed Weights   04/10/22 0408  Weight: 134.5 kg    Examination: General: In bed, tachypneic, obese, HEENT: MM pink/moist, anicteric, atraumatic Neuro: RASS 0, PERRL 30m, GCS 15 CV: S1S2, ST, no m/r/g appreciated PULM:  clear in the upper lobes, clear in the lower lobes, trachea midline, chest expansion symmetric GI: rounded, bsx4 hypoactive, generalized tenderness   Extremities: cool/dry, no  pretibial edema, capillary refill less than 3 seconds  Skin:  no rashes or lesions noted  Labs/imaging ABG 7.39/33/89/20 Lactic acid 5.5>4.5>3.4>2.4 Sodium 125 > 130 > 132 > 133 > 133 Glucose 183 > 197 CO2 17, anion gap 10 Creatinine 3.33 > 3.2 > 3.08 > 3.29 > 4.21 Beta hydroxybutyric acid 1.76 > 0.21.  UA with ketones, + glucose Ethanol within normal limits PCT 0.54 Blood cultures no growth 24 hours Hemoglobin A1c 12.5 Triglycerides 1864, cholesterol 350 Lipase 2291 WBC 14.0  CT abdomen pelvis concerning for acute pancreatitis, no biliary dilation, status postcholecystectomy Right upper quadrant ultrasound with hepatic steatosis Chest x-ray: Opacity of left lung, small right pleural effusion, no pneumothorax, no obvious infiltrate EKG: Sinus tach, no ST changes noted  Resolved Hospital Problem list     Assessment & Plan:  DKA-improving DM2, suspect poorly controlled Lactic acidosis, secondary to DKA Beta hydroxybutyric acid 1.76 > 0.21.  UA with ketones, + glucose, Glucose 183 > 197, CO2 17, anion gap 10. Hemoglobin A1c 12.5. -Glucose below 250, continue D5LR at 150 -Continue insulin drip per Endo tool  - Closely monitor patient's electrolytes with frequent Bmets. Goal potassium greater than 4. -Start long acting insulin- glargine 15 u -Trend lactate -Diabetes educator consult -Gap closed.  We will try diet in a.m.  Acute pancreatitis Shock, multifactorial SIRS secondary to pancreatitis CT abdomen pelvis 6/5 concerning for acute pancreatitis, no biliary dilation, status postcholecystectomy. Triglycerides 1864, cholesterol 350. No infectious source seen on CT A/P, suspect leukocytosis reactive, patient acidotic secondary to DKA. Volume  depleted secondary to pancreatitis, DKA. -Goal MAP greater than 65.  Titrate Levophed to goal. -Status post at least 3 L of IV fluid resuscitation at [facility]. -Follow cultures. -Insulin drip as above -Trend triglycerides -Was on PO  oxycodone at [facility]. Will trial APAP for pain control, LFTs fine, hope to prevent ileus. 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  5 to 6 L nasal cannula.  Patient of Dr. Halford Chessman. -Goal oxygen sat 90-96%.  Titrate oxygen to goal. -Continue BiPAP and 2 L oxygen at night [facility], reports helping on last visit with Dr. Halford Chessman  AKI on CKD 3 Creatinine 3.33 > 3.2 > 3.08 > 3.29 > 4.21.  850 mL of urine output. -Ensure renal perfusion. Goal MAP 65 or greater. -Avoid neprotoxic drugs as possible. -Strict I&O's -Follow up AM creatinine -Hydration with IV fluids  History hypertension Currently hypotensive on Levophed -Hold home antihypertensives  Hep C with advanced liver fibrosis -Trend LFTs -Followed by Dr. Abbey Chatters with [facility] gastroenterology  Best Practice (right click and "Reselect all SmartList Selections" daily)   Diet/type: NPO w/ oral meds DVT prophylaxis: prophylactic heparin  GI prophylaxis: H2B Lines: N/A Foley:  N/A Code Status:  full code Last date of multidisciplinary goals of care discussion [pending]  Labs   CBC: Recent Labs  Lab 04/10/22 0405  WBC 14.0*  HGB 14.7  HCT 45.4  MCV 82.2  PLT 063    Basic Metabolic Panel: Recent Labs  Lab 04/10/22 0405 04/10/22 0906 04/10/22 1254 04/10/22 1656 04/10/22 2300  NA 125* 130* 132* 133* 133*  K 5.0 4.9 4.6 4.8 4.8  CL 89* 98 101 104 106  CO2 16* 16* 18* 18* 17*  GLUCOSE 774* 608* 425* 278* 206*  BUN 44* 41* 41* 41* 42*  CREATININE 3.33* 3.20* 3.08* 3.29* 4.21*  CALCIUM 9.4 8.7* 8.6* 8.3* 7.8*   GFR: Estimated Creatinine Clearance: 24.4 mL/min (A) (by C-G formula based on SCr of 4.21 mg/dL (H)). Recent Labs  Lab 04/10/22 0405 04/10/22 0534 04/10/22 0703 04/10/22 0906 04/10/22 1254 04/10/22 1844 04/10/22 2300  PROCALCITON  --   --  0.54  --   --   --    --   WBC 14.0*  --   --   --   --   --   --   LATICACIDVEN 5.1*   < >  --  5.5* 4.5* 3.4* 2.4*   < > = values in this interval not displayed.    Liver Function Tests: Recent Labs  Lab 04/10/22 0405  AST 26  ALT 19  ALKPHOS 99  BILITOT 0.9  PROT 9.4*  ALBUMIN 3.9   Recent Labs  Lab 04/10/22 0405  LIPASE 2,291*   No results for input(s): "AMMONIA" in the last 168 hours.  ABG    Component Value Date/Time   PHART 7.39 04/10/2022 2338   PCO2ART 33 04/10/2022 2338   PO2ART 89 04/10/2022 2338   HCO3 20.0 04/10/2022 2338   ACIDBASEDEF 4.1 (H) 04/10/2022 2338   O2SAT 96.9 04/10/2022 2338     Coagulation Profile: No results for input(s): "INR", "PROTIME" in the last 168 hours.  Cardiac Enzymes: No results for input(s): "CKTOTAL", "CKMB", "CKMBINDEX", "TROPONINI" in the last 168 hours.  HbA1C: Hgb A1c MFr Bld  Date/Time Value Ref Range Status  04/10/2022 04:05 AM 12.5 (H) 4.8 - 5.6 %  Final    Comment:    (NOTE) Pre diabetes:          5.7%-6.4%  Diabetes:              >6.4%  Glycemic control for   <7.0% adults with diabetes     CBG: Recent Labs  Lab 04/10/22 2201 04/10/22 2251 04/11/22 0008 04/11/22 0058 04/11/22 0159  GLUCAP 216* 198* 180* 163* 183*    Review of Systems:   Positives in bold  Gen: fever, chills, weight change, fatigue, night sweats HEENT:  blurred vision, double vision, hearing loss, tinnitus, sinus congestion, rhinorrhea, sore throat, neck stiffness, dysphagia PULM:  shortness of breath, cough, sputum production, hemoptysis, wheezing CV: chest pain, edema, orthopnea, paroxysmal nocturnal dyspnea, palpitations GI:  abdominal pain, nausea, vomiting, diarrhea, hematochezia, melena, constipation, change in bowel habits GU: dysuria, hematuria, polyuria, oliguria, urethral discharge Endocrine: hot or cold intolerance, polyuria, polyphagia or appetite change Derm: rash, dry skin, scaling or peeling skin change Heme: easy bruising, bleeding,  bleeding gums Neuro: headache, numbness, weakness, slurred speech, loss of memory or consciousness   Past Medical History:  He,  has a past medical history of Alcohol abuse, Anxiety, Bronchitis, Chronic hip pain, CKD (chronic kidney disease) stage 3, GFR 30-59 ml/min (HCC), COPD (chronic obstructive pulmonary disease) ([place]), DKA (diabetic ketoacidosis) ([place]) (04/10/2022), Essential hypertension, Hepatitis C, Mixed hyperlipidemia, and Sleep apnea.   Surgical History:   Past Surgical History:  Procedure Laterality Date   BALLOON DILATION N/A 07/06/2021   Procedure: BALLOON DILATION;  Surgeon: [name], DO;  [facility];  Service: Endoscopy;  Laterality: N/A;   BIOPSY  07/06/2021   Procedure: BIOPSY;  Surgeon: [name], DO;  [facility];  Service: Endoscopy;;   CATARACT EXTRACTION Right    CHOLECYSTECTOMY N/A 03/23/2021   Procedure: LAPAROSCOPIC CHOLECYSTECTOMY;  Surgeon: [name], MD;  Location: [facility];  Service: General;  Laterality: N/A;   COLONOSCOPY N/A 01/23/2014   SLF:The LEFT COLON IS EXTREMELY redundant/TWO RECTAL POLYPS REMOVED/SMALL VOLUME RECTAL BLEEDING MOST LIKELY DUE TO Small internal hemorrhoids. path with prolapse type polyp of benign-mucosa. no adenomas   COLONOSCOPY WITH PROPOFOL N/A 07/06/2021   non-bleeding internal hemorrhoids, one 8 mm polyp in sigmod s/p removal and clip placed. One 5 mm polyp at recto-sigmoid s/p removal. 3 year surveillance. Tubular adenomas   ESOPHAGOGASTRODUODENOSCOPY N/A 01/23/2014   EGB:TDVVOHYW ring at the gastroesophageal junction/Medium sized hiatal hernia/NDYSPEPSIA MOST LIKELY DUE TO GERD/MILD Non-erosive gastritis   ESOPHAGOGASTRODUODENOSCOPY N/A 04/18/2018   food impaction, small hiatal hernia   ESOPHAGOGASTRODUODENOSCOPY (EGD) WITH PROPOFOL N/A 07/06/2021   moderate Schatzki ring s/p dilation, gastritis s/p biopsy, normal duodenum. Negative H.pylori.   HEMORRHOID BANDING  01/23/2014    Procedure: HEMORRHOID BANDING;  Surgeon: Danie Binder, MD;  [facility];  Service: Endoscopy;;   JOINT REPLACEMENT Right 2017   LIVER BIOPSY N/A 03/23/2021   Procedure: LAPAROSCOPIC LIVER BIOPSY;  Surgeon: [name], MD;  Location: [facility];  Service: General;  Laterality: N/A;   MOUTH SURGERY     POLYPECTOMY  07/06/2021   Procedure: POLYPECTOMY;  Surgeon: [name], DO;  [facility];  Service: Endoscopy;;   TOTAL HIP ARTHROPLASTY Right 06/04/2016   Procedure: RIGHT TOTAL HIP ARTHROPLASTY ANTERIOR APPROACH;  Surgeon: Mcarthur Rossetti, MD;  Location[facility];  Service: Orthopedics;  Laterality: Right;   TOTAL HIP ARTHROPLASTY Left 03/02/2019   Procedure: LEFT TOTAL HIP ARTHROPLASTY ANTERIOR APPROACH;  Surgeon: Mcarthur Rossetti, MD;  Location: [facility];  Service: Orthopedics;  Laterality: Left;     Social History:   reports that he quit smoking about 7 years ago. His smoking use included cigarettes. He started smoking about 50 years ago. He has a 42.00 pack-year smoking history. He has been exposed to tobacco smoke. He has never used smokeless tobacco. He reports that he does not currently use alcohol. He reports that he does not use drugs.   Family History:  His family history includes Cancer in his mother. There is no history of Colon cancer or Colon polyps.   Allergies No Known Allergies   Home Medications  Prior to Admission medications   Medication Sig Start Date End Date Taking? Authorizing Provider  albuterol (PROAIR HFA) 108 (90 Base) MCG/ACT inhaler Inhale 2 puffs into the lungs every 4 (four) hours as needed for wheezing or shortness of breath. 02/15/22   Chesley Mires, MD  amLODipine (NORVASC) 10 MG tablet Take 10 mg by mouth daily. 02/05/19   [provider]  amoxicillin-clavulanate (AUGMENTIN) 875-125 MG tablet Take 1 tablet by mouth 2 (two) times daily. 03/16/22   Chesley Mires, MD  aspirin EC 81 MG tablet Take 81 mg by  mouth daily.    [provider]  cholecalciferol (VITAMIN D) 25 MCG (1000 UNIT) tablet Take 1,000 Units by mouth daily.    [provider]  DULoxetine (CYMBALTA) 60 MG capsule Take 60 mg by mouth daily. 06/15/21   [provider]  hydrOXYzine (ATARAX/VISTARIL) 25 MG tablet Take 50 mg by mouth at bedtime.    [provider]  loratadine (CLARITIN) 10 MG tablet Take 1 tablet (10 mg total) by mouth daily. 09/02/19   Barton Dubois, MD  losartan (COZAAR) 50 MG tablet Take 50 mg by mouth daily. 02/11/20   [provider]  oxyCODONE (OXY IR/ROXICODONE) 5 MG immediate release tablet Take 5 mg by mouth every 8 (eight) hours as needed for moderate pain. 02/25/20   [provider]  pantoprazole (PROTONIX) 40 MG tablet Take 1 tablet (40 mg total) by mouth 2 (two) times daily before a meal. 30 minutes before breakfast 03/08/22 09/04/22  Annitta Needs, NP  sildenafil (VIAGRA) 100 MG tablet Take 100 mg by mouth daily as needed for erectile dysfunction. 04/21/21   [provider]     Critical care time: 40 minutes    Redmond School., MSN, APRN, AGACNP-BC [facility]  04/11/2022 , 4:06 AM  Please see [email or web address] for pager details  If no response, please call [phone] After hours, please call Elink at [phone]

## 2022-04-11 NOTE — Progress Notes (Signed)
Upon reassessment RN noted that PIV that levo was running through appeared infiltrated, surrounding skin swollen and cool.  IV watch on and monitoring this IV, however no alarm/not registering infiltration.  Infusion stopped, switched to different PIV. Pt states no pain or irritation in affected area.  Relayed to charge RN and pharmacist.  Infiltration protocol started.

## 2022-04-11 NOTE — Progress Notes (Signed)
Bladder pressure showing 24 w/ good wave form. Relayed to CCM MD.

## 2022-04-11 NOTE — Progress Notes (Signed)
PCCM Interval Note   21 yoM admitted overnight with DKA, hypertriglyceridemia-induced pancreatitis, and AKI.     Increasing NE requirements this morning.  Responded well to albumin earlier.  Currently wearing nasal cpap but not working well.  Mild nausea.  Some SOB secondary to abd pain.    SBP 70's on NE 7 mcg peripherally  ST 120 Remains afebrile  Labs reviewed, GAP closed and Beta-hydroxybutyric acid normalized UOP 153m/ 8hrs Net +4.1L  Ill appearing  Diffusely diaphoretic  Alert, answers questions appropriately Abd protuberant and grossly tender, scant BS  Clear anteriorly, diminished bases, tachypniec and mildly labored on nasal cpap.  O2 saturations stable No LE edema   [facility]  [facility], defer BiPAP currently given mild nausea.  O2 saturations not an issue - monitor for worsening respiratory failure secondary to abd splinting - additional LR bolus now> limit fluids as able, consider albumin if further needed - may need CVL pending pressor trends  - Pending BMET, CBC, and lactic > trend BMET q 4 - check iCa - Change insulin protocol to hypertriglyceridemia-induced pancreatitis protocol and change fluids to D5 and will titrate that for goal glucose of 180 - goal triglyceride < 500, trend q 6 - Prn low dose fentanyl with tylenol for pain control - Dulcolax and aggressive bowel regiment to previous ileus , KUB PRN - Monitor for abd compartment syndrome, check bladder pressures q 4, goal < 20  Additional time: 30 mins     BKennieth Rad MSN, AG-ACNP-BC [facility] 04/11/2022, 9:04 AM  See Amion for pager If no response to pager, please call PCCM consult pager After 7:00 pm call Elink

## 2022-04-11 NOTE — Procedures (Signed)
Central Venous Catheter Insertion Procedure Note  Mathew Lara  [id number]  [birth year]  Date:04/11/22  Time:10:17 AM   Provider Performing:Brooke Moshe Cipro   Procedure: Insertion of Non-tunneled Central Venous [phone]) with US guidance ([id number])   Indication(s) Medication administration and Difficult access  Consent Risks of the procedure as well as the alternatives and risks of each were explained to the patient and/or caregiver.  Consent for the procedure was obtained and is signed in the bedside chart  Anesthesia Topical only with 1% lidocaine   Timeout Verified patient identification, verified procedure, site/side was marked, verified correct patient position, special equipment/implants available, medications/allergies/relevant history reviewed, required imaging and test results available.  Sterile Technique Maximal sterile technique including full sterile barrier drape, hand hygiene, sterile gown, sterile gloves, mask, hair covering, sterile ultrasound probe cover (if used).  Procedure Description Area of catheter insertion was cleaned with chlorhexidine and draped in sterile fashion.  With real-time ultrasound guidance a central venous catheter was placed into the left femoral vein. Nonpulsatile blood flow and easy flushing noted in all ports.  The catheter was sutured in place and sterile dressing applied.     Complications/Tolerance None; patient tolerated the procedure well. Chest X-ray is ordered to verify placement for internal jugular or subclavian cannulation.   Chest x-ray is not ordered for femoral cannulation.  EBL Minimal  Specimen(s) None     Kennieth Rad, MSN, AG-ACNP-BC [facility] 04/11/2022, 10:18 AM  See Amion for pager If no response to pager, please call PCCM consult pager After 7:00 pm call Elink

## 2022-04-11 NOTE — Progress Notes (Signed)
Blood gas checked while patient on nasal BiPAP. Blood gas in normal range.

## 2022-04-11 NOTE — Progress Notes (Addendum)
[place] Progress Note Patient Name: Mathew Lara DOB: 27-Oct-1960 MRN: [id number]   Date of Service  04/11/2022  HPI/Events of Note  Blood pressure soft despite 10 mcg of Norepinephrine gtt.  eICU Interventions  Albumin 5 % 500 ml iv bolus x 1. If hypotension persists will ask PCCM ground crew to place a central line.        Mathew Lara 04/11/2022, 3:13 AM

## 2022-04-11 NOTE — Progress Notes (Signed)
[place] Progress Note Patient Name: Mathew Lara DOB: 1960/11/18 MRN: [id number]   Date of Service  04/11/2022  HPI/Events of Note  Lactic acid 2.4  eICU Interventions  Continue Rx for DKA and trend  lactic acid.        [name] 04/11/2022, 5:43 AM

## 2022-04-12 ENCOUNTER — Inpatient Hospital Stay (HOSPITAL_COMMUNITY): Payer: Medicare Other

## 2022-04-12 DIAGNOSIS — J9601 Acute respiratory failure with hypoxia: Secondary | ICD-10-CM | POA: Diagnosis not present

## 2022-04-12 DIAGNOSIS — N179 Acute kidney failure, unspecified: Secondary | ICD-10-CM | POA: Diagnosis not present

## 2022-04-12 DIAGNOSIS — K8581 Other acute pancreatitis with uninfected necrosis: Secondary | ICD-10-CM

## 2022-04-12 DIAGNOSIS — I10 Essential (primary) hypertension: Secondary | ICD-10-CM | POA: Diagnosis not present

## 2022-04-12 LAB — POCT I-STAT 7, (LYTES, BLD GAS, ICA,H+H)
Acid-base deficit: 8 mmol/L — ABNORMAL HIGH (ref 0.0–2.0)
Bicarbonate: 19.1 mmol/L — ABNORMAL LOW (ref 20.0–28.0)
Calcium, Ion: 1.04 mmol/L — ABNORMAL LOW (ref 1.15–1.40)
HCT: 36 % — ABNORMAL LOW (ref 39.0–52.0)
Hemoglobin: 12.2 g/dL — ABNORMAL LOW (ref 13.0–17.0)
O2 Saturation: 97 %
Patient temperature: 97.6
Potassium: 5.1 mmol/L (ref 3.5–5.1)
Sodium: 132 mmol/L — ABNORMAL LOW (ref 135–145)
TCO2: 20 mmol/L — ABNORMAL LOW (ref 22–32)
pCO2 arterial: 44.2 mmHg (ref 32–48)
pH, Arterial: 7.24 — ABNORMAL LOW (ref 7.35–7.45)
pO2, Arterial: 110 mmHg — ABNORMAL HIGH (ref 83–108)

## 2022-04-12 LAB — GLUCOSE, CAPILLARY
Glucose-Capillary: 107 mg/dL — ABNORMAL HIGH (ref 70–99)
Glucose-Capillary: 119 mg/dL — ABNORMAL HIGH (ref 70–99)
Glucose-Capillary: 126 mg/dL — ABNORMAL HIGH (ref 70–99)
Glucose-Capillary: 127 mg/dL — ABNORMAL HIGH (ref 70–99)
Glucose-Capillary: 128 mg/dL — ABNORMAL HIGH (ref 70–99)
Glucose-Capillary: 135 mg/dL — ABNORMAL HIGH (ref 70–99)
Glucose-Capillary: 136 mg/dL — ABNORMAL HIGH (ref 70–99)
Glucose-Capillary: 139 mg/dL — ABNORMAL HIGH (ref 70–99)
Glucose-Capillary: 139 mg/dL — ABNORMAL HIGH (ref 70–99)
Glucose-Capillary: 144 mg/dL — ABNORMAL HIGH (ref 70–99)
Glucose-Capillary: 146 mg/dL — ABNORMAL HIGH (ref 70–99)
Glucose-Capillary: 150 mg/dL — ABNORMAL HIGH (ref 70–99)
Glucose-Capillary: 154 mg/dL — ABNORMAL HIGH (ref 70–99)
Glucose-Capillary: 158 mg/dL — ABNORMAL HIGH (ref 70–99)
Glucose-Capillary: 161 mg/dL — ABNORMAL HIGH (ref 70–99)
Glucose-Capillary: 164 mg/dL — ABNORMAL HIGH (ref 70–99)
Glucose-Capillary: 167 mg/dL — ABNORMAL HIGH (ref 70–99)
Glucose-Capillary: 61 mg/dL — ABNORMAL LOW (ref 70–99)
Glucose-Capillary: 90 mg/dL (ref 70–99)
Glucose-Capillary: 93 mg/dL (ref 70–99)
Glucose-Capillary: 95 mg/dL (ref 70–99)

## 2022-04-12 LAB — BASIC METABOLIC PANEL
Anion gap: 13 (ref 5–15)
Anion gap: 11 (ref 5–15)
Anion gap: 14 (ref 5–15)
Anion gap: 8 (ref 5–15)
BUN: 46 mg/dL — ABNORMAL HIGH (ref 8–23)
BUN: 47 mg/dL — ABNORMAL HIGH (ref 8–23)
BUN: 47 mg/dL — ABNORMAL HIGH (ref 8–23)
BUN: 49 mg/dL — ABNORMAL HIGH (ref 8–23)
CO2: 18 mmol/L — ABNORMAL LOW (ref 22–32)
CO2: 18 mmol/L — ABNORMAL LOW (ref 22–32)
CO2: 20 mmol/L — ABNORMAL LOW (ref 22–32)
CO2: 22 mmol/L (ref 22–32)
Calcium: 7.3 mg/dL — ABNORMAL LOW (ref 8.9–10.3)
Calcium: 7.3 mg/dL — ABNORMAL LOW (ref 8.9–10.3)
Calcium: 7.5 mg/dL — ABNORMAL LOW (ref 8.9–10.3)
Calcium: 7.2 mg/dL — ABNORMAL LOW (ref 8.9–10.3)
Chloride: 103 mmol/L (ref 98–111)
Chloride: 105 mmol/L (ref 98–111)
Chloride: 101 mmol/L (ref 98–111)
Chloride: 104 mmol/L (ref 98–111)
Creatinine, Ser: 5.7 mg/dL — ABNORMAL HIGH (ref 0.61–1.24)
Creatinine, Ser: 5.82 mg/dL — ABNORMAL HIGH (ref 0.61–1.24)
Creatinine, Ser: 6.1 mg/dL — ABNORMAL HIGH (ref 0.61–1.24)
Creatinine, Ser: 6.58 mg/dL — ABNORMAL HIGH (ref 0.61–1.24)
GFR, Estimated: 11 mL/min — ABNORMAL LOW (ref >60)
GFR, Estimated: 10 mL/min — ABNORMAL LOW (ref >60)
GFR, Estimated: 10 mL/min — ABNORMAL LOW (ref >60)
GFR, Estimated: 9 mL/min — ABNORMAL LOW (ref >60)
Glucose, Bld: 167 mg/dL — ABNORMAL HIGH (ref 70–99)
Glucose, Bld: 150 mg/dL — ABNORMAL HIGH (ref 70–99)
Glucose, Bld: 142 mg/dL — ABNORMAL HIGH (ref 70–99)
Glucose, Bld: 90 mg/dL (ref 70–99)
Potassium: 4.5 mmol/L (ref 3.5–5.1)
Potassium: 4.1 mmol/L (ref 3.5–5.1)
Potassium: 4 mmol/L (ref 3.5–5.1)
Potassium: 4 mmol/L (ref 3.5–5.1)
Sodium: 134 mmol/L — ABNORMAL LOW (ref 135–145)
Sodium: 134 mmol/L — ABNORMAL LOW (ref 135–145)
Sodium: 135 mmol/L (ref 135–145)
Sodium: 134 mmol/L — ABNORMAL LOW (ref 135–145)

## 2022-04-12 LAB — CBC WITH DIFFERENTIAL/PLATELET
Abs Immature Granulocytes: 0.03 10*3/uL (ref 0.00–0.07)
Basophils Absolute: 0 10*3/uL (ref 0.0–0.1)
Basophils Relative: 0 %
Eosinophils Absolute: 0.3 10*3/uL (ref 0.0–0.5)
Eosinophils Relative: 2 %
HCT: 38.7 % — ABNORMAL LOW (ref 39.0–52.0)
Hemoglobin: 12.7 g/dL — ABNORMAL LOW (ref 13.0–17.0)
Immature Granulocytes: 0 %
Lymphocytes Relative: 7 %
Lymphs Abs: 0.9 10*3/uL (ref 0.7–4.0)
MCH: 27.3 pg (ref 26.0–34.0)
MCHC: 32.8 g/dL (ref 30.0–36.0)
MCV: 83 fL (ref 80.0–100.0)
Monocytes Absolute: 1.4 10*3/uL — ABNORMAL HIGH (ref 0.1–1.0)
Monocytes Relative: 10 %
Neutro Abs: 10.5 10*3/uL — ABNORMAL HIGH (ref 1.7–7.7)
Neutrophils Relative %: 81 %
Platelets: 206 10*3/uL (ref 150–400)
RBC: 4.66 MIL/uL (ref 4.22–5.81)
RDW: 16.3 % — ABNORMAL HIGH (ref 11.5–15.5)
WBC: 13.1 10*3/uL — ABNORMAL HIGH (ref 4.0–10.5)
nRBC: 0 % (ref 0.0–0.2)

## 2022-04-12 LAB — TRIGLYCERIDES
Triglycerides: 638 mg/dL — ABNORMAL HIGH (ref <150)
Triglycerides: 597 mg/dL — ABNORMAL HIGH (ref <150)
Triglycerides: 542 mg/dL — ABNORMAL HIGH (ref <150)

## 2022-04-12 LAB — PROCALCITONIN: Procalcitonin: 5.37 ng/mL

## 2022-04-12 LAB — LIPASE, BLOOD: Lipase: 312 U/L — ABNORMAL HIGH (ref 11–51)

## 2022-04-12 MED ORDER — DEXTROSE 20 % IV SOLN
INTRAVENOUS | Status: DC
  Administered 2022-04-12: 80 mL via INTRAVENOUS
  Filled 2022-04-12 (×13): qty 500

## 2022-04-12 MED ORDER — OXYCODONE-ACETAMINOPHEN 5-325 MG PO TABS
2.0000 | ORAL_TABLET | Freq: Four times a day (QID) | ORAL | Status: DC | PRN
  Administered 2022-04-14 – 2022-04-18 (×12): 2 via ORAL
  Filled 2022-04-12 (×12): qty 2

## 2022-04-12 MED ORDER — DEXMEDETOMIDINE HCL IN NACL 400 MCG/100ML IV SOLN
0.4000 ug/kg/h | INTRAVENOUS | Status: DC
  Administered 2022-04-12: 1 ug/kg/h via INTRAVENOUS
  Administered 2022-04-12: 1.2 ug/kg/h via INTRAVENOUS
  Administered 2022-04-12: 0.4 ug/kg/h via INTRAVENOUS
  Administered 2022-04-13: 1 ug/kg/h via INTRAVENOUS
  Administered 2022-04-13 (×2): 0.9 ug/kg/h via INTRAVENOUS
  Administered 2022-04-13: 1 ug/kg/h via INTRAVENOUS
  Administered 2022-04-13: 0.9 ug/kg/h via INTRAVENOUS
  Administered 2022-04-13 (×2): 1 ug/kg/h via INTRAVENOUS
  Administered 2022-04-14 (×3): 0.9 ug/kg/h via INTRAVENOUS
  Filled 2022-04-12 (×13): qty 100

## 2022-04-12 MED ORDER — DEXTROSE 50 % IV SOLN
12.5000 g | Freq: Once | INTRAVENOUS | Status: AC
  Administered 2022-04-12: 12.5 g via INTRAVENOUS

## 2022-04-12 MED ORDER — SODIUM CHLORIDE 0.9% FLUSH
10.0000 mL | INTRAVENOUS | Status: DC | PRN
  Administered 2022-04-14: 10 mL
  Administered 2022-04-22: 20 mL
  Administered 2022-04-25: 40 mL

## 2022-04-12 MED ORDER — FUROSEMIDE 10 MG/ML IJ SOLN
8.0000 mg/h | INTRAVENOUS | Status: DC
  Administered 2022-04-12: 8 mg/h via INTRAVENOUS
  Filled 2022-04-12: qty 20

## 2022-04-12 MED ORDER — MIDAZOLAM HCL 2 MG/2ML IJ SOLN
INTRAMUSCULAR | Status: AC
  Filled 2022-04-12: qty 2

## 2022-04-12 MED ORDER — SODIUM CHLORIDE 0.9% FLUSH
10.0000 mL | Freq: Two times a day (BID) | INTRAVENOUS | Status: DC
  Administered 2022-04-12: 10 mL
  Administered 2022-04-14: 20 mL
  Administered 2022-04-14 – 2022-04-19 (×11): 10 mL
  Administered 2022-04-20: 15 mL
  Administered 2022-04-20 – 2022-05-13 (×43): 10 mL

## 2022-04-12 MED ORDER — ETOMIDATE 2 MG/ML IV SOLN
INTRAVENOUS | Status: AC
  Filled 2022-04-12: qty 20

## 2022-04-12 MED ORDER — FUROSEMIDE 10 MG/ML IJ SOLN
80.0000 mg | Freq: Once | INTRAMUSCULAR | Status: AC
  Administered 2022-04-12: 80 mg via INTRAVENOUS
  Filled 2022-04-12: qty 8

## 2022-04-12 MED ORDER — ROCURONIUM BROMIDE 10 MG/ML (PF) SYRINGE
PREFILLED_SYRINGE | INTRAVENOUS | Status: AC
  Filled 2022-04-12: qty 10

## 2022-04-12 MED ORDER — FENTANYL CITRATE PF 50 MCG/ML IJ SOSY
PREFILLED_SYRINGE | INTRAMUSCULAR | Status: AC
  Filled 2022-04-12: qty 2

## 2022-04-12 NOTE — Progress Notes (Signed)
Inpatient Diabetes Program Recommendations  AACE/ADA: New Consensus Statement on Inpatient Glycemic Control (2015)  Target Ranges:  Prepandial:   less than 140 mg/dL      Peak postprandial:   less than 180 mg/dL (1-2 hours)      Critically ill patients:  140 - 180 mg/dL   Lab Results  Component Value Date   GLUCAP 90 04/12/2022   HGBA1C 12.5 (H) 04/10/2022    Review of Glycemic Control  Latest Reference Range & Units 04/12/22 08:28 04/12/22 08:52 04/12/22 09:57 04/12/22 11:05 04/12/22 12:02 04/12/22 12:49  Glucose-Capillary 70 - 99 mg/dL 164 (H) 139 (H) 128 (H) 119 (H) 95 90   Diabetes history: DM 2 Outpatient Diabetes medications:  Jardiance 10 mg q AM, Kerendia 10 mg daily Current orders for Inpatient glycemic control:  IV insulin- 0.1 units/hr for hypertriglyceridemia-induced pancreatitis  Inpatient Diabetes Program Recommendations:    Agree with current orders.  Note that A1C is high (12.5%) which may indicate need for insulin at discharge.  Will follow.    Thanks,  Adah Perl, RN, BC-ADM Inpatient Diabetes Coordinator Pager [phone]  (8a-5p)

## 2022-04-12 NOTE — Progress Notes (Addendum)
RT placed patient on bipap per MD order. Patient seems to be tolerating well at this time. RN at bedside. RT will continue to monitor.

## 2022-04-12 NOTE — Progress Notes (Signed)
Mathew Lara c/o SOB. RR 26 - 32,labored.  SaO2 92%. RT already gave him a neb tx for his wheezing but didn't help. He c/o feeling hot and is moaning, saying " Oh god". BP 101/76, HR 125. Dr Ander Slade notified.

## 2022-04-12 NOTE — Progress Notes (Signed)
Worsened breathing status. Markedly volume up. Exacerbated by suspected anxiety with VCD. Start precedex drip to lighten this O2 needs not too high at present (30L + 40% on HFNC) If can get to a good spot can try BIPAP+precedex. High risk of progressing to intubation/CRRT need, he is okay with either.  Erskine Emery MD PCCM

## 2022-04-12 NOTE — Consult Note (Signed)
Reason for Consult: AKI/CKD stage III-IV Referring Physician: Ander Slade, MD  Mathew Lara is an 61 y.o. male with a PMH significant for HTN, morbid obesity, COPD, fatty liver, DM type 2, HLD, HepC, and CKD stage IIIb-IV who presented to [facility] ED on 04/10/22 with a 3 day history of diffuse abdominal pain and bloating.  He also reported anorexia and constipation.  In the ED, VSS, labs notable for WBC 14, Na 125, Co2 16, BUN 44, Cr 3.33, glucose 776, lactate 5.1. lipase 2291, triglycerides 1864.  CT scan of abd/pelvis consistent with acute pancreatitis.  He was admitted for further workup and treatment.  He developed increased work of breathing, diaphoresis, and restlessness.  He was placed on BiPap and was transferred to the ICU at [facility].  We were consulted due to worsening AKI/CKD stage III-IV.  The trend in Scr is seen below.    He denies any N/V, dysuria, pyuria, hematuria, urgency, frequency, or retention.  No NSAIDs or COX-II I's.  No recent IV contrasted study.  No alcohol.  CT scan of abd/pelvis revealed normal appearing kidneys, no hydronephrosis, calculi, or focal lesions.   Of note, he had been taking losartan, Jardiance, and Kerendia prior to admission and had been on them several months before his admission.  He also became hypotensive yesterday requiring levophed.  Trend in Creatinine: Creatinine, Ser  Date/Time Value Ref Range Status  04/12/2022 09:10 AM 6.10 (H) 0.61 - 1.24 mg/dL Final  04/12/2022 04:48 AM 5.82 (H) 0.61 - 1.24 mg/dL Final  04/12/2022 01:45 AM 5.70 (H) 0.61 - 1.24 mg/dL Final  04/11/2022 09:00 PM 5.37 (H) 0.61 - 1.24 mg/dL Final  04/11/2022 06:00 PM 5.26 (H) 0.61 - 1.24 mg/dL Final  04/11/2022 02:30 PM 5.21 (H) 0.61 - 1.24 mg/dL Final  04/11/2022 08:36 AM 5.30 (H) 0.61 - 1.24 mg/dL Final  04/11/2022 08:30 AM 4.56 (H) 0.61 - 1.24 mg/dL Final  04/11/2022 03:45 AM 4.69 (H) 0.61 - 1.24 mg/dL Final  04/10/2022 11:00 PM 4.21 (H) 0.61 - 1.24 mg/dL Final  04/10/2022 04:56 PM  3.29 (H) 0.61 - 1.24 mg/dL Final  04/10/2022 12:54 PM 3.08 (H) 0.61 - 1.24 mg/dL Final  04/10/2022 09:06 AM 3.20 (H) 0.61 - 1.24 mg/dL Final  04/10/2022 04:05 AM 3.33 (H) 0.61 - 1.24 mg/dL Final  03/16/2022 10:50 AM 2.29 (H) 0.76 - 1.27 mg/dL Final  07/02/2021 12:55 PM 2.21 (H) 0.61 - 1.24 mg/dL Final  03/28/2021 05:00 AM 2.68 (H) 0.61 - 1.24 mg/dL Final  03/27/2021 05:31 AM 2.83 (H) 0.61 - 1.24 mg/dL Final  03/26/2021 08:13 AM 2.54 (H) 0.61 - 1.24 mg/dL Final  03/25/2021 05:39 AM 2.20 (H) 0.61 - 1.24 mg/dL Final  02/03/2021 03:58 PM 2.02 (H) 0.76 - 1.27 mg/dL Final  11/26/2019 01:16 PM 1.35 (H) 0.61 - 1.24 mg/dL Final  10/02/2019 04:22 AM 1.49 (H) 0.61 - 1.24 mg/dL Final  10/01/2019 03:49 AM 1.67 (H) 0.61 - 1.24 mg/dL Final  09/30/2019 03:40 AM 1.87 (H) 0.61 - 1.24 mg/dL Final  09/29/2019 03:16 AM 2.76 (H) 0.61 - 1.24 mg/dL Final  09/28/2019 12:37 PM 3.12 (H) 0.61 - 1.24 mg/dL Final  09/20/2019 05:38 AM 1.87 (H) 0.61 - 1.24 mg/dL Final  09/20/2019 05:38 AM 1.91 (H) 0.61 - 1.24 mg/dL Final  09/19/2019 02:19 AM 2.36 (H) 0.61 - 1.24 mg/dL Final  09/18/2019 05:24 PM 2.66 (H) 0.61 - 1.24 mg/dL Final  09/02/2019 07:57 AM 1.47 (H) 0.61 - 1.24 mg/dL Final  09/01/2019 07:49 AM 1.95 (H) 0.61 -  1.24 mg/dL Final  08/31/2019 07:54 AM 1.84 (H) 0.61 - 1.24 mg/dL Final  08/30/2019 06:14 AM 2.52 (H) 0.61 - 1.24 mg/dL Final  08/29/2019 11:55 AM 3.23 (H) 0.61 - 1.24 mg/dL Final  08/07/2019 05:16 AM 1.49 (H) 0.61 - 1.24 mg/dL Final  03/03/2019 02:58 AM 1.64 (H) 0.61 - 1.24 mg/dL Final  02/23/2019 03:00 PM 1.72 (H) 0.61 - 1.24 mg/dL Final  04/18/2018 09:12 AM 2.04 (H) 0.61 - 1.24 mg/dL Final  04/17/2018 09:33 AM 1.57 (H) 0.61 - 1.24 mg/dL Final  03/27/2018 04:50 AM 1.78 (H) 0.61 - 1.24 mg/dL Final  03/26/2018 07:46 AM 3.04 (H) 0.61 - 1.24 mg/dL Final  03/08/2018 02:45 PM 1.71 (H) 0.61 - 1.24 mg/dL Final  03/08/2018 03:17 AM 2.34 (H) 0.61 - 1.24 mg/dL Final  06/05/2016 04:31 AM 1.34 (H) 0.61 - 1.24  mg/dL Final  06/02/2016 09:45 AM 2.53 (H) 0.61 - 1.24 mg/dL Final  09/24/2013 06:25 PM 2.27 (H) 0.50 - 1.35 mg/dL Final  07/06/2013 10:47 AM 1.54 (H) 0.50 - 1.35 mg/dL Final    PMH:   Past Medical History:  Diagnosis Date   Alcohol abuse    Anxiety    Bronchitis    Chronic hip pain    CKD (chronic kidney disease) stage 3, GFR 30-59 ml/min (HCC)    COPD (chronic obstructive pulmonary disease) ([place])    DKA (diabetic ketoacidosis) ([place]) 04/10/2022   Essential hypertension    Hepatitis C    Mixed hyperlipidemia    Sleep apnea     PSH:   Past Surgical History:  Procedure Laterality Date   BALLOON DILATION N/A 07/06/2021   Procedure: BALLOON DILATION;  Surgeon: [name], DO;  Location: [facility];  Service: Endoscopy;  Laterality: N/A;   BIOPSY  07/06/2021   Procedure: BIOPSY;  Surgeon: [name], DO;  Location: [facility];  Service: Endoscopy;;   CATARACT EXTRACTION Right    CHOLECYSTECTOMY N/A 03/23/2021   Procedure: LAPAROSCOPIC CHOLECYSTECTOMY;  Surgeon: [name], MD;  Location: [facility] OR[facility];  Service: General;  Laterality: N/A;   COLONOSCOPY N/A 01/23/2014   SLF:The LEFT COLON IS EXTREMELY redundant/TWO RECTAL POLYPS REMOVED/SMALL VOLUME RECTAL BLEEDING MOST LIKELY DUE TO Small internal hemorrhoids. path with prolapse type polyp of benign-mucosa. no adenomas   COLONOSCOPY WITH PROPOFOL N/A 07/06/2021   non-bleeding internal hemorrhoids, one 8 mm polyp in sigmod s/p removal and clip placed. One 5 mm polyp at recto-sigmoid s/p removal. 3 year surveillance. Tubular adenomas   ESOPHAGOGASTRODUODENOSCOPY N/A 01/23/2014   BJY:NWGNFAOZ ring at the gastroesophageal junction/Medium sized hiatal hernia/NDYSPEPSIA MOST LIKELY DUE TO GERD/MILD Non-erosive gastritis   ESOPHAGOGASTRODUODENOSCOPY N/A 04/18/2018   food impaction, small hiatal hernia   ESOPHAGOGASTRODUODENOSCOPY (EGD) WITH PROPOFOL N/A 07/06/2021   moderate Schatzki ring s/p dilation, gastritis s/p  biopsy, normal duodenum. Negative H.pylori.   HEMORRHOID BANDING  01/23/2014   Procedure: HEMORRHOID BANDING;  Surgeon: Danie Binder, MD;  Location: [facility];  Service: Endoscopy;;   JOINT REPLACEMENT Right 2017   LIVER BIOPSY N/A 03/23/2021   Procedure: LAPAROSCOPIC LIVER BIOPSY;  Surgeon: [name], MD;  Location: [facility];  Service: General;  Laterality: N/A;   MOUTH SURGERY     POLYPECTOMY  07/06/2021   Procedure: POLYPECTOMY;  Surgeon: [name], DO;  Location: [facility];  Service: Endoscopy;;   TOTAL HIP ARTHROPLASTY Right 06/04/2016   Procedure: RIGHT TOTAL HIP ARTHROPLASTY ANTERIOR APPROACH;  Surgeon: Mcarthur Rossetti, MD;  Location: [facility];  Service: Orthopedics;  Laterality: Right;   TOTAL HIP ARTHROPLASTY Left 03/02/2019   Procedure: LEFT TOTAL HIP ARTHROPLASTY ANTERIOR APPROACH;  Surgeon: Mcarthur Rossetti, MD;  Location: [facility];  Service: Orthopedics;  Laterality: Left;    Allergies: No Known Allergies  Medications:   Prior to Admission medications   Medication Sig Start Date End Date Taking? Authorizing Provider  albuterol (PROAIR HFA) 108 (90 Base) MCG/ACT inhaler Inhale 2 puffs into the lungs every 4 (four) hours as needed for wheezing or shortness of breath. 02/15/22   Chesley Mires, MD  amLODipine (NORVASC) 10 MG tablet Take 10 mg by mouth daily. 02/05/19   [provider]  amoxicillin-clavulanate (AUGMENTIN) 875-125 MG tablet Take 1 tablet by mouth 2 (two) times daily. 03/16/22   Chesley Mires, MD  aspirin EC 81 MG tablet Take 81 mg by mouth daily.    [provider]  cholecalciferol (VITAMIN D) 25 MCG (1000 UNIT) tablet Take 1,000 Units by mouth daily.    [provider]  DULoxetine (CYMBALTA) 60 MG capsule Take 60 mg by mouth daily. 06/15/21   [provider]  fenofibrate 160 MG tablet Take 160 mg by mouth daily. 02/26/22   [provider]  hydrALAZINE (APRESOLINE) 50 MG tablet Take 100 mg by  mouth at bedtime. 02/04/22   [provider]  hydrOXYzine (ATARAX) 50 MG tablet Take 50 mg by mouth at bedtime. 02/05/22   [provider]  hydrOXYzine (ATARAX/VISTARIL) 25 MG tablet Take 50 mg by mouth at bedtime.    [provider]  JARDIANCE 10 MG TABS tablet Take 10 mg by mouth every morning. 03/08/22   [provider]  KERENDIA 10 MG TABS Take 1 tablet by mouth daily. 03/16/22   [provider]  loratadine (CLARITIN) 10 MG tablet Take 1 tablet (10 mg total) by mouth daily. 09/02/19   Barton Dubois, MD  losartan (COZAAR) 50 MG tablet Take 50 mg by mouth daily. 02/11/20   [provider]  oxyCODONE (OXY IR/ROXICODONE) 5 MG immediate release tablet Take 5 mg by mouth every 8 (eight) hours as needed for moderate pain. 02/25/20   [provider]  pantoprazole (PROTONIX) 40 MG tablet Take 1 tablet (40 mg total) by mouth 2 (two) times daily before a meal. 30 minutes before breakfast 03/08/22 09/04/22  Annitta Needs, NP  sildenafil (VIAGRA) 100 MG tablet Take 100 mg by mouth daily as needed for erectile dysfunction. 04/21/21   [provider]    Inpatient medications:  arformoterol  15 mcg Nebulization BID   aspirin EC  81 mg Oral Daily   budesonide (PULMICORT) nebulizer solution  0.5 mg Nebulization BID   Chlorhexidine Gluconate Cloth  6 each Topical Q0600   heparin  5,000 Units Subcutaneous Q8H   mupirocin ointment  1 Application Nasal BID   senna-docusate  1 tablet Oral BID   sodium chloride flush  10-40 mL Intracatheter Q12H    Discontinued Meds:   Medications Discontinued During This Encounter  Medication Reason   norepinephrine (LEVOPHED) 103m in 25599m(0.016 mg/mL) premix infusion    midodrine (PROAMATINE) tablet 10 mg    morphine (PF) 2 MG/ML injection 2 mg    norepinephrine (LEVOPHED) 99m85mn 250m75m.016 mg/mL) premix infusion    fentaNYL (SUBLIMAZE) injection 25 mcg    norepinephrine (LEVOPHED) 99mg 51m250mL 22m16 mg/mL)  premix infusion    succinylcholine (ANECTINE) 200 MG/10ML syringe Returned to ADS   propofol (DIPRIVAN) 1000 MG/100ML infusion Returned to  ADS   rocuronium bromide 100 MG/10ML SOSY Returned to ADS   phenylephrine (NEOSYNEPHRINE) 20-0.9 MG/250ML-% infusion Returned to ADS   fentaNYL 10 mcg/ml infusion Returned to ADS   fentaNYL 2563mg in NS 2516m(10100mml) infusion-PREMIX    oxyCODONE (Oxy IR/ROXICODONE) immediate release tablet 5 mg    fentaNYL (SUBLIMAZE) injection 12.5 mcg    insulin glargine-yfgn (SEMGLEE) injection 15 Units    mometasone-formoterol (DULERA) 100-5 MCG/ACT inhaler 2 puff    insulin glargine-yfgn (SEMGLEE) injection 35 Units    insulin regular, human (MYXREDLIN) 100 units/ 100 mL infusion    lactated ringers infusion    dextrose 5 % in lactated ringers infusion    hydrOXYzine (ATARAX) tablet 50 mg    loratadine (CLARITIN) tablet 10 mg    nitroGLYCERIN (NITROGLYN) 2 % ointment 1 inch    dextrose 5 % solution    norepinephrine (LEVOPHED) 4mg7m 250mL59m016 mg/mL) premix infusion    0.9 %  sodium chloride infusion    norepinephrine (LEVOPHED) 4mg i19m50mL (19m6 mg/mL) premix infusion    DULoxetine (CYMBALTA) DR capsule 60 mg     Social History:  reports that he quit smoking about 7 years ago. His smoking use included cigarettes. He started smoking about 50 years ago. He has a 42.00 pack-year smoking history. He has been exposed to tobacco smoke. He has never used smokeless tobacco. He reports that he does not currently use alcohol. He reports that he does not use drugs.  Family History:   Family History  Problem Relation Age of Onset   Cancer Mother    Colon cancer Neg Hx    Colon polyps Neg Hx     Pertinent items are noted in HPI. Weight change:   Intake/Output Summary (Last 24 hours) at 04/12/2022 1214 Last data filed at 04/12/2022 1100 Gross per 24 hour  Intake 3037.98 ml  Output 115 ml  Net 2922.98 ml   BP 111/71   Pulse (!) 115   Temp 98.9 F (37.2  C) (Oral)   Resp (!) 30   Ht '5\' 8"'  (1.727 m)   Wt 132.8 kg   SpO2 93%   BMI 44.52 kg/m  Vitals:   04/12/22 0930 04/12/22 1000 04/12/22 1030 04/12/22 1100  BP: 112/66 (!) 134/120 111/71   Pulse: (!) 118 (!) 118 (!) 115   Resp: (!) 27 (!) 22 (!) 30   Temp:    98.9 F (37.2 C)  TempSrc:    Oral  SpO2: 93% 90% 93%   Weight:      Height:         General appearance: fatigued, mild distress, and morbidly obese Head: Normocephalic, without obvious abnormality, atraumatic Eyes: negative findings: lids and lashes normal, conjunctivae and sclerae normal, and corneas clear Resp: clear to auscultation bilaterally Cardio: tachycardic, no rub GI: obese, +BS, soft, mildly tender to deep palpation, no guarding/rebound Extremities: extremities normal, atraumatic, no cyanosis or edema  Labs: Basic Metabolic Panel: Recent Labs  Lab 04/10/22 0405 04/10/22 0906 04/11/22 0830 04/11/22 0836 04/11/22 1430 04/11/22 1800 04/11/22 2100 04/12/22 0145 04/12/22 0448 04/12/22 0910  NA 125*   < > 135 139 135 134* 135 134* 134* 135  K 5.0   < > 5.1 4.3 4.1 4.4 4.4 4.5 4.1 4.0  CL 89*   < > 108 108 106 106 105 103 105 101  CO2 16*   < > 17*  --  19* 19* 18* 18* 18* 20*  GLUCOSE 774*   < >  167* 179* 115* 166* 178* 167* 150* 142*  BUN 44*   < > 42* 39* 43* 43* 44* 46* 47* 47*  CREATININE 3.33*   < > 4.56* 5.30* 5.21* 5.26* 5.37* 5.70* 5.82* 6.10*  ALBUMIN 3.9  --   --   --   --   --   --   --   --   --   CALCIUM 9.4   < > 7.1*  --  7.4* 7.4* 7.5* 7.3* 7.3* 7.5*   < > = values in this interval not displayed.   Liver Function Tests: Recent Labs  Lab 04/10/22 0405  AST 26  ALT 19  ALKPHOS 99  BILITOT 0.9  PROT 9.4*  ALBUMIN 3.9   Recent Labs  Lab 04/10/22 0405 04/12/22 0448  LIPASE 2,291* 312*   No results for input(s): "AMMONIA" in the last 168 hours. CBC: Recent Labs  Lab 04/10/22 0405 04/11/22 0830 04/11/22 0836 04/12/22 0448  WBC 14.0* 19.5*  --  13.1*  NEUTROABS  --  17.4*   --  10.5*  HGB 14.7 14.1 15.3 12.7*  HCT 45.4 43.5 45.0 38.7*  MCV 82.2 81.9  --  83.0  PLT 320 275  --  206   PT/INR: '@LABRCNTIP' (inr:5) Cardiac Enzymes: )No results for input(s): "CKTOTAL", "CKMB", "CKMBINDEX", "TROPONINI" in the last 168 hours. CBG: Recent Labs  Lab 04/12/22 0828 04/12/22 0852 04/12/22 0957 04/12/22 1105 04/12/22 1202  GLUCAP 164* 139* 128* 119* 95    Iron Studies: No results for input(s): "IRON", "TIBC", "TRANSFERRIN", "FERRITIN" in the last 168 hours.  Xrays/Other Studies: DG Abd Portable 1V  Result Date: 04/11/2022 CLINICAL DATA:  Abdominal distension. EXAM: PORTABLE ABDOMEN - 1 VIEW COMPARISON:  04/10/2022 FINDINGS: Exam detail diminished secondary to body habitus. Possibly a bowel gas. No dilated loops of bowel noted. Status post bilateral hip arthroplasty. Left groin central venous catheter is identified with tip projecting over the left sacrum. IMPRESSION: 1. Limited exam secondary body habitus and portable technique. 2. Paucity of bowel gas.  No dilated bowel loops noted. 3. Left groin central venous catheter tip projects over the left sacrum. Electronically Signed   By: Kerby Moors M.D.   On: 04/11/2022 11:30   DG CHEST PORT 1 VIEW  Result Date: 04/10/2022 CLINICAL DATA:  10027 tachypnea EXAM: PORTABLE CHEST 1 VIEW COMPARISON:  Radiograph dated April 10, 2022, CT dated March 12, 2022 FINDINGS: The cardiomediastinal silhouette is unchanged in contour.Unchanged elevation of the RIGHT hemidiaphragm. Bullous changes bilaterally. Similar appearance of RIGHT-sided pleural effusion versus pleural thickening. No pneumothorax. Revisualization of a rounded opacity of the LEFT upper lung with a coarse rind which corresponds to bulla with adjacent inflammation on prior CT. Visualized abdomen is unremarkable. IMPRESSION: Similar appearance of an opacity of the LEFT lung corresponding to a possibly inflamed bulla on prior chest CT dated March 12, 2022. Electronically Signed    By: Valentino Saxon M.D.   On: 04/10/2022 17:08     Assessment/Plan:  AKI/CKD stage IIIb-IV - presumably ischemic ATN in setting of acute pancreatitis, hypotension, pressor support, with concomitant ARB, SGLT-2 inhibitor and finerenone.  Initially non-oliguric but now UOP has decreased since being placed on pressors.   Continue to hold losartan, Jardiance, and [place].   No urgent indication for dialysis, however if his UOP and renal function continue to worsen, we may need to start CRRT.  Will continue to follow closely. Renal dose meds for eGFR <15 mL/min.   Avoid nephrotoxic agents such as IV  contrast, NSAIDs, and phosphate containing bowel preps (FLEETS).   Preferred narcotics are hydromorphone, fentanyl, and methadone.  Morphine should be avoided. Acute pancreatitis - due to hypertriglyceridemia.  DKA - likely due to SGLT-2 inhibitor.   Shock - likely due to pancreatitis.  Currently on levophed. Acute hypoxic respiratory failure - with underlying COPD and OSA.  Per PCCM HTN - hold bp meds Hep C with advanced liver fibrosis Chronic diastolic CHF - stable.   Governor Rooks Lochlyn Zullo 04/12/2022, 12:14 PM

## 2022-04-12 NOTE — Progress Notes (Addendum)
Bladder pressure shows 16 with good wave form. Previous reading was 24.   Antonieta Pert, RN

## 2022-04-12 NOTE — Progress Notes (Signed)
[facility] Progress Note Patient Name: Mathew Lara DOB: 1961/02/02 MRN: [id number]   Date of Service  04/12/2022  HPI/Events of Note  Called for worsening respiratory status on Bipap. Was on HFNC during the day. BIPAP started at shift change. Also started on Precedex since he was not tolerating BIPAP. He was tolerating it briefly but has gotten worse. Seen on camera. RR 24-30, very shallow breaths. O2 95 on 60% fio2. RN also notes they are monitoring intra abdominal pressures and this was worse now and feels more fluid is accumulating. On palpation, mild tenderness in belly and distension continues.   eICU Interventions  Keep NPO. Retain bipap. Has only made 50 cc urine after 160 mg lasix and a lasix drip. Also requiring more levophed.   Will have ground team assess the patient since he is likely headed toward intubation and may end up needing CRRT. D/w RN.      Intervention Category Major Interventions: Respiratory failure - evaluation and management  Mica Releford G Mickenzie Stolar 04/12/2022, 8:22 PM  Addendum at 6:10 am AM bmp noted Bedside had stopped lasix since it did not work much Insulin, d50, calcium, bicarb and albuterol ordered Recheck K at 8 am, serial glucose ordered Likely headed toward HD today and for now will use shifting therapy for the K

## 2022-04-12 NOTE — Progress Notes (Signed)
Bladder pressure 22 w/good waveform

## 2022-04-12 NOTE — Progress Notes (Signed)
NAME:  DEVION CHRISCOE, MRN:  [id number], DOB:  1960-12-13, LOS: 2 ADMISSION DATE:  04/10/2022, CONSULTATION DATE:  8/5 REFERRING MD:  Darrell Jewel, CHIEF COMPLAINT: Abdominal pain  History of Present Illness:  Mathew Lara is an 61 y.o. M who presented to the [facility] ED on 8/5 with a chief complaint of abdominal pain.  They have a past medical history of alcohol abuse, anxiety, CKD 3, COPD, OSA, type 2 diabetes, hypertension, hyperlipidemia, hep C  ED work-up revealed DKA, acute pancreatitis, AKI.  He was admitted to the hospital service.  Overnight he developed continuing respiratory distress, shock.   PCCM was consulted for transfer to [facility] for further management.  Still requiring pressors, abdominal pain is improved  Pertinent  Medical History  Alcohol abuse, anxiety, CKD 3, COPD, OSA, type 2 diabetes, hypertension, hyperlipidemia, hep C  Significant Hospital Events: Including procedures, antibiotic start and stop dates in addition to other pertinent events   8/5 presented to [facility] ED, transferred [facility] 8/7 on pressors   Interim History / Subjective:   Middle-age, does not appear to be in distress, following commands Abdominal pain is better Occasional cough, not really bringing up any secretions Still requiring pressors Improved nausea  Objective   Blood pressure 122/85, pulse (!) 116, temperature 98.9 F (37.2 C), temperature source Oral, resp. rate (!) 22, height '5\' 8"'$  (1.727 m), weight 132.8 kg, SpO2 100 %. CVP:  [0 mmHg] 0 mmHg  FiO2 (%):  [40 %-50 %] 40 %   Intake/Output Summary (Last 24 hours) at 04/12/2022 0803 Last data filed at 04/12/2022 0753 Gross per 24 hour  Intake 3128.64 ml  Output 175 ml  Net 2953.64 ml   Filed Weights   04/10/22 0408 04/11/22 0245  Weight: 134.5 kg 132.8 kg    Examination: General: Appears comfortable, obese HEENT: Moist oral mucosa Neuro: Alert and oriented x3, moving all extremities CV: S1-S2  appreciated PULM: Clear breath sounds bilaterally GI: Bowel sounds appreciated Extremities: No edema, no clubbing Skin:  no rashes or lesions noted  Labs/imaging  ABG 7.3 9/33/89/20 Lactate 1.5 Sodium 134, potassium 4.1, bicarb 18, BUN 47, creatinine 5.82 Anion gap-11 Creatinine 5.82-trending higher Beta hydroxybutyric acid 1.76 > 0.21.  UA with ketones, + glucose Ethanol within normal limits  Lipase 312, down from 2291  No growth on blood cultures  Triglycerides 1864, cholesterol 350  WBC 14.0  CT abdomen pelvis concerning for acute pancreatitis, no biliary dilation, status postcholecystectomy Right upper quadrant ultrasound with hepatic steatosis Chest x-ray: Opacity of left lung, small right pleural effusion, no pneumothorax, no obvious infiltrate EKG: Sinus tach, no ST changes noted  Resolved Hospital Problem list     Assessment & Plan:   Type 2 diabetes DKA -Gap closed -On Endo tool -On D5 LR -Diabetes educator  Pancreatitis -Nausea improved -May start a diet  Multifactorial shock SIRS secondary to pancreatitis CT abdomen noted -Lipase trending well -Pain management -Continue Levophed, wean as tolerated  Acute kidney injury on chronic kidney disease Was seen Dr. Theador Hawthorne as outpatient -Will consult renal -He is nonoliguric -Maintain renal perfusion -Maintain MAP greater than 65 -Avoid nephrotoxic agents  Acute hypoxemic respiratory failure Underlying chronic obstructive pulmonary disease Obstructive sleep apnea  History of interstitial lung disease from prior COVID infection Patient of Dr. Juanetta Gosling as outpatient Uses BiPAP with oxygen supplementation at 3 L -Continue Dulera  History of hypertension -Hold home antihypertensives  History of hepatitis C with advanced liver fibrosis -Followed by Dr.  Carver with [facility] gastroenterology -Monitor closely  History of diastolic heart failure -Last echocardiogram 12/25/2019, ejection fraction of  60-65%   Best Practice (right click and "Reselect all SmartList Selections" daily)   Diet/type: NPO w/ oral meds DVT prophylaxis: prophylactic heparin  GI prophylaxis: H2B Lines: N/A Foley:  N/A Code Status:  full code Last date of multidisciplinary goals of care discussion [pending]  Labs   CBC: Recent Labs  Lab 04/10/22 0405 04/11/22 0830 04/11/22 0836 04/12/22 0448  WBC 14.0* 19.5*  --  13.1*  NEUTROABS  --  17.4*  --  10.5*  HGB 14.7 14.1 15.3 12.7*  HCT 45.4 43.5 45.0 38.7*  MCV 82.2 81.9  --  83.0  PLT 320 275  --  528    Basic Metabolic Panel: Recent Labs  Lab 04/11/22 1430 04/11/22 1800 04/11/22 2100 04/12/22 0145 04/12/22 0448  NA 135 134* 135 134* 134*  K 4.1 4.4 4.4 4.5 4.1  CL 106 106 105 103 105  CO2 19* 19* 18* 18* 18*  GLUCOSE 115* 166* 178* 167* 150*  BUN 43* 43* 44* 46* 47*  CREATININE 5.21* 5.26* 5.37* 5.70* 5.82*  CALCIUM 7.4* 7.4* 7.5* 7.3* 7.3*  MG 1.8  --   --   --   --    GFR: Estimated Creatinine Clearance: 17.5 mL/min (A) (by C-G formula based on SCr of 5.82 mg/dL (H)). Recent Labs  Lab 04/10/22 0405 04/10/22 0534 04/10/22 0703 04/10/22 0906 04/10/22 2300 04/11/22 0345 04/11/22 0400 04/11/22 0830 04/11/22 1430 04/12/22 0448  PROCALCITON  --   --  0.54  --   --  10.42  --   --   --  5.37  WBC 14.0*  --   --   --   --   --   --  19.5*  --  13.1*  LATICACIDVEN 5.1*   < >  --    < > 2.4*  --  2.4* 2.6* 1.5  --    < > = values in this interval not displayed.    Liver Function Tests: Recent Labs  Lab 04/10/22 0405  AST 26  ALT 19  ALKPHOS 99  BILITOT 0.9  PROT 9.4*  ALBUMIN 3.9   Recent Labs  Lab 04/10/22 0405 04/12/22 0448  LIPASE 2,291* 312*   No results for input(s): "AMMONIA" in the last 168 hours.  ABG    Component Value Date/Time   PHART 7.39 04/10/2022 2338   PCO2ART 33 04/10/2022 2338   PO2ART 89 04/10/2022 2338   HCO3 20.0 04/10/2022 2338   TCO2 19 (L) 04/11/2022 0836   ACIDBASEDEF 4.1 (H)  04/10/2022 2338   O2SAT 96.9 04/10/2022 2338     Coagulation Profile: No results for input(s): "INR", "PROTIME" in the last 168 hours.  Cardiac Enzymes: No results for input(s): "CKTOTAL", "CKMB", "CKMBINDEX", "TROPONINI" in the last 168 hours.  HbA1C: Hgb A1c MFr Bld  Date/Time Value Ref Range Status  04/10/2022 04:05 AM 12.5 (H) 4.8 - 5.6 % Final    Comment:    (NOTE) Pre diabetes:          5.7%-6.4%  Diabetes:              >6.4%  Glycemic control for   <7.0% adults with diabetes     CBG: Recent Labs  Lab 04/12/22 0143 04/12/22 0347 04/12/22 0452 04/12/22 0638 04/12/22 0744  GLUCAP 158* 150* 144* 146* 61*    Review of Systems:   Positives in bold  Gen:  fever, chills, weight change, fatigue, night sweats HEENT:  blurred vision, double vision, hearing loss, tinnitus, sinus congestion, rhinorrhea, sore throat, neck stiffness, dysphagia PULM:  shortness of breath, cough, sputum production, hemoptysis, wheezing CV: chest pain, edema, orthopnea, paroxysmal nocturnal dyspnea, palpitations GI:  abdominal pain, nausea, vomiting, diarrhea, hematochezia, melena, constipation, change in bowel habits GU: dysuria, hematuria, polyuria, oliguria, urethral discharge Endocrine: hot or cold intolerance, polyuria, polyphagia or appetite change Derm: rash, dry skin, scaling or peeling skin change Heme: easy bruising, bleeding, bleeding gums Neuro: headache, numbness, weakness, slurred speech, loss of memory or consciousness   Past Medical History:  He,  has a past medical history of Alcohol abuse, Anxiety, Bronchitis, Chronic hip pain, CKD (chronic kidney disease) stage 3, GFR 30-59 ml/min (HCC), COPD (chronic obstructive pulmonary disease) ([place]), DKA (diabetic ketoacidosis) ([place]) (04/10/2022), Essential hypertension, Hepatitis C, Mixed hyperlipidemia, and Sleep apnea.   Surgical History:   Past Surgical History:  Procedure Laterality Date   BALLOON DILATION N/A 07/06/2021    Procedure: BALLOON DILATION;  Surgeon: [name], DO;  Location: [facility];  Service: Endoscopy;  Laterality: N/A;   BIOPSY  07/06/2021   Procedure: BIOPSY;  Surgeon: [name], DO;  Location: [facility];  Service: Endoscopy;;   CATARACT EXTRACTION Right    CHOLECYSTECTOMY N/A 03/23/2021   Procedure: LAPAROSCOPIC CHOLECYSTECTOMY;  Surgeon: [name], MD;  Location: [facility] ORS;  Service: General;  Laterality: N/A;   COLONOSCOPY N/A 01/23/2014   SLF:The LEFT COLON IS EXTREMELY redundant/TWO RECTAL POLYPS REMOVED/SMALL VOLUME RECTAL BLEEDING MOST LIKELY DUE TO Small internal hemorrhoids. path with prolapse type polyp of benign-mucosa. no adenomas   COLONOSCOPY WITH PROPOFOL N/A 07/06/2021   non-bleeding internal hemorrhoids, one 8 mm polyp in sigmod s/p removal and clip placed. One 5 mm polyp at recto-sigmoid s/p removal. 3 year surveillance. Tubular adenomas   ESOPHAGOGASTRODUODENOSCOPY N/A 01/23/2014   HYQ:MVHQIONG ring at the gastroesophageal junction/Medium sized hiatal hernia/NDYSPEPSIA MOST LIKELY DUE TO GERD/MILD Non-erosive gastritis   ESOPHAGOGASTRODUODENOSCOPY N/A 04/18/2018   food impaction, small hiatal hernia   ESOPHAGOGASTRODUODENOSCOPY (EGD) WITH PROPOFOL N/A 07/06/2021   moderate Schatzki ring s/p dilation, gastritis s/p biopsy, normal duodenum. Negative H.pylori.   HEMORRHOID BANDING  01/23/2014   Procedure: HEMORRHOID BANDING;  Surgeon: Danie Binder, MD;  Location: [facility];  Service: Endoscopy;;   JOINT REPLACEMENT Right 2017   LIVER BIOPSY N/A 03/23/2021   Procedure: LAPAROSCOPIC LIVER BIOPSY;  Surgeon: [name], MD;  Location: [facility];  Service: General;  Laterality: N/A;   MOUTH SURGERY     POLYPECTOMY  07/06/2021   Procedure: POLYPECTOMY;  Surgeon: [name], DO;  Location: [facility];  Service: Endoscopy;;   TOTAL HIP ARTHROPLASTY Right 06/04/2016   Procedure: RIGHT TOTAL HIP ARTHROPLASTY ANTERIOR APPROACH;  Surgeon:  Mcarthur Rossetti, MD;  Location: [facility];  Service: Orthopedics;  Laterality: Right;   TOTAL HIP ARTHROPLASTY Left 03/02/2019   Procedure: LEFT TOTAL HIP ARTHROPLASTY ANTERIOR APPROACH;  Surgeon: Mcarthur Rossetti, MD;  Location: [facility];  Service: Orthopedics;  Laterality: Left;     Social History:   reports that he quit smoking about 7 years ago. His smoking use included cigarettes. He started smoking about 50 years ago. He has a 42.00 pack-year smoking history. He has been exposed to tobacco smoke. He has never used smokeless tobacco. He reports that he does not currently use alcohol. He reports that he does not use drugs.   Family History:  His family history includes Cancer in his mother. There is no history of Colon cancer or Colon polyps.   Allergies No Known Allergies   Home Medications  Prior to Admission medications   Medication Sig Start Date End Date Taking? Authorizing Provider  albuterol (PROAIR HFA) 108 (90 Base) MCG/ACT inhaler Inhale 2 puffs into the lungs every 4 (four) hours as needed for wheezing or shortness of breath. 02/15/22   Chesley Mires, MD  amLODipine (NORVASC) 10 MG tablet Take 10 mg by mouth daily. 02/05/19   [provider]  amoxicillin-clavulanate (AUGMENTIN) 875-125 MG tablet Take 1 tablet by mouth 2 (two) times daily. 03/16/22   Chesley Mires, MD  aspirin EC 81 MG tablet Take 81 mg by mouth daily.    [provider]  cholecalciferol (VITAMIN D) 25 MCG (1000 UNIT) tablet Take 1,000 Units by mouth daily.    [provider]  DULoxetine (CYMBALTA) 60 MG capsule Take 60 mg by mouth daily. 06/15/21   [provider]  hydrOXYzine (ATARAX/VISTARIL) 25 MG tablet Take 50 mg by mouth at bedtime.    [provider]  loratadine (CLARITIN) 10 MG tablet Take 1 tablet (10 mg total) by mouth daily. 09/02/19   Barton Dubois, MD  losartan (COZAAR) 50 MG tablet Take 50 mg by mouth daily. 02/11/20   [provider]   oxyCODONE (OXY IR/ROXICODONE) 5 MG immediate release tablet Take 5 mg by mouth every 8 (eight) hours as needed for moderate pain. 02/25/20   [provider]  pantoprazole (PROTONIX) 40 MG tablet Take 1 tablet (40 mg total) by mouth 2 (two) times daily before a meal. 30 minutes before breakfast 03/08/22 09/04/22  Annitta Needs, NP  sildenafil (VIAGRA) 100 MG tablet Take 100 mg by mouth daily as needed for erectile dysfunction. 04/21/21   [provider]   The patient is critically ill with multiple organ systems failure and requires high complexity decision making for assessment and support, frequent evaluation and titration of therapies, application of advanced monitoring technologies and extensive interpretation of multiple databases. Critical Care Time devoted to patient care services described in this note independent of APP/resident time (if applicable)  is 32 minutes.   Sherrilyn Rist MD [facility] Personal pager: See Amion If unanswered, please page CCM On-call: [phone]

## 2022-04-12 NOTE — Progress Notes (Signed)
Per Doctor Duwayne Heck, discontinued Lasix drip at 2350. Alena Bills, RN [phone]

## 2022-04-13 ENCOUNTER — Inpatient Hospital Stay (HOSPITAL_COMMUNITY): Payer: Medicare Other

## 2022-04-13 DIAGNOSIS — N179 Acute kidney failure, unspecified: Secondary | ICD-10-CM | POA: Diagnosis not present

## 2022-04-13 DIAGNOSIS — F1011 Alcohol abuse, in remission: Secondary | ICD-10-CM | POA: Diagnosis not present

## 2022-04-13 DIAGNOSIS — K8500 Idiopathic acute pancreatitis without necrosis or infection: Secondary | ICD-10-CM

## 2022-04-13 DIAGNOSIS — E7800 Pure hypercholesterolemia, unspecified: Secondary | ICD-10-CM | POA: Diagnosis not present

## 2022-04-13 DIAGNOSIS — J9601 Acute respiratory failure with hypoxia: Secondary | ICD-10-CM | POA: Diagnosis not present

## 2022-04-13 LAB — RENAL FUNCTION PANEL
Albumin: 2.3 g/dL — ABNORMAL LOW (ref 3.5–5.0)
Albumin: 2 g/dL — ABNORMAL LOW (ref 3.5–5.0)
Anion gap: 13 (ref 5–15)
Anion gap: 11 (ref 5–15)
BUN: 57 mg/dL — ABNORMAL HIGH (ref 8–23)
BUN: 56 mg/dL — ABNORMAL HIGH (ref 8–23)
CO2: 18 mmol/L — ABNORMAL LOW (ref 22–32)
CO2: 21 mmol/L — ABNORMAL LOW (ref 22–32)
Calcium: 7.3 mg/dL — ABNORMAL LOW (ref 8.9–10.3)
Calcium: 7.5 mg/dL — ABNORMAL LOW (ref 8.9–10.3)
Chloride: 100 mmol/L (ref 98–111)
Chloride: 103 mmol/L (ref 98–111)
Creatinine, Ser: 7.36 mg/dL — ABNORMAL HIGH (ref 0.61–1.24)
Creatinine, Ser: 6.66 mg/dL — ABNORMAL HIGH (ref 0.61–1.24)
GFR, Estimated: 8 mL/min — ABNORMAL LOW (ref >60)
GFR, Estimated: 9 mL/min — ABNORMAL LOW (ref >60)
Glucose, Bld: 229 mg/dL — ABNORMAL HIGH (ref 70–99)
Glucose, Bld: 266 mg/dL — ABNORMAL HIGH (ref 70–99)
Phosphorus: 4.7 mg/dL — ABNORMAL HIGH (ref 2.5–4.6)
Phosphorus: 5.1 mg/dL — ABNORMAL HIGH (ref 2.5–4.6)
Potassium: 6 mmol/L — ABNORMAL HIGH (ref 3.5–5.1)
Potassium: 5 mmol/L (ref 3.5–5.1)
Sodium: 131 mmol/L — ABNORMAL LOW (ref 135–145)
Sodium: 135 mmol/L (ref 135–145)

## 2022-04-13 LAB — GLUCOSE, CAPILLARY
Glucose-Capillary: 135 mg/dL — ABNORMAL HIGH (ref 70–99)
Glucose-Capillary: 135 mg/dL — ABNORMAL HIGH (ref 70–99)
Glucose-Capillary: 189 mg/dL — ABNORMAL HIGH (ref 70–99)
Glucose-Capillary: 195 mg/dL — ABNORMAL HIGH (ref 70–99)
Glucose-Capillary: 203 mg/dL — ABNORMAL HIGH (ref 70–99)
Glucose-Capillary: 216 mg/dL — ABNORMAL HIGH (ref 70–99)
Glucose-Capillary: 218 mg/dL — ABNORMAL HIGH (ref 70–99)
Glucose-Capillary: 244 mg/dL — ABNORMAL HIGH (ref 70–99)
Glucose-Capillary: 244 mg/dL — ABNORMAL HIGH (ref 70–99)
Glucose-Capillary: 246 mg/dL — ABNORMAL HIGH (ref 70–99)
Glucose-Capillary: 268 mg/dL — ABNORMAL HIGH (ref 70–99)
Glucose-Capillary: 286 mg/dL — ABNORMAL HIGH (ref 70–99)

## 2022-04-13 LAB — CBC WITH DIFFERENTIAL/PLATELET
Abs Immature Granulocytes: 0.1 10*3/uL — ABNORMAL HIGH (ref 0.00–0.07)
Basophils Absolute: 0.1 10*3/uL (ref 0.0–0.1)
Basophils Relative: 0 %
Eosinophils Absolute: 0.2 10*3/uL (ref 0.0–0.5)
Eosinophils Relative: 1 %
HCT: 37.1 % — ABNORMAL LOW (ref 39.0–52.0)
Hemoglobin: 11.4 g/dL — ABNORMAL LOW (ref 13.0–17.0)
Immature Granulocytes: 1 %
Lymphocytes Relative: 5 %
Lymphs Abs: 0.7 10*3/uL (ref 0.7–4.0)
MCH: 26.6 pg (ref 26.0–34.0)
MCHC: 30.7 g/dL (ref 30.0–36.0)
MCV: 86.5 fL (ref 80.0–100.0)
Monocytes Absolute: 1.8 10*3/uL — ABNORMAL HIGH (ref 0.1–1.0)
Monocytes Relative: 11 %
Neutro Abs: 13.3 10*3/uL — ABNORMAL HIGH (ref 1.7–7.7)
Neutrophils Relative %: 82 %
Platelets: 233 10*3/uL (ref 150–400)
RBC: 4.29 MIL/uL (ref 4.22–5.81)
RDW: 16.7 % — ABNORMAL HIGH (ref 11.5–15.5)
WBC: 16.2 10*3/uL — ABNORMAL HIGH (ref 4.0–10.5)
nRBC: 0.2 % (ref 0.0–0.2)

## 2022-04-13 LAB — TRIGLYCERIDES
Triglycerides: 438 mg/dL — ABNORMAL HIGH (ref <150)
Triglycerides: 410 mg/dL — ABNORMAL HIGH (ref <150)
Triglycerides: 414 mg/dL — ABNORMAL HIGH (ref <150)
Triglycerides: 470 mg/dL — ABNORMAL HIGH (ref <150)

## 2022-04-13 LAB — PROCALCITONIN: Procalcitonin: 4.42 ng/mL

## 2022-04-13 LAB — POTASSIUM
Potassium: 4.7 mmol/L (ref 3.5–5.1)
Potassium: 5 mmol/L (ref 3.5–5.1)

## 2022-04-13 LAB — LIPASE, BLOOD: Lipase: 128 U/L — ABNORMAL HIGH (ref 11–51)

## 2022-04-13 LAB — MAGNESIUM: Magnesium: 2.1 mg/dL (ref 1.7–2.4)

## 2022-04-13 MED ORDER — ORAL CARE MOUTH RINSE: 15.0000 mL | OROMUCOSAL | Status: DC | PRN

## 2022-04-13 MED ORDER — SODIUM CHLORIDE 0.9 % IV SOLN
500.0000 [IU]/h | INTRAVENOUS | Status: DC
  Administered 2022-04-13 – 2022-04-15 (×3): 500 [IU]/h via INTRAVENOUS_CENTRAL
  Administered 2022-04-16 – 2022-04-17 (×4): 750 [IU]/h via INTRAVENOUS_CENTRAL
  Administered 2022-04-18 – 2022-04-25 (×16): 1000 [IU]/h via INTRAVENOUS_CENTRAL
  Filled 2022-04-13 (×8): qty 2
  Filled 2022-04-13 (×2): qty 10000
  Filled 2022-04-13 (×13): qty 2
  Filled 2022-04-13: qty 10000
  Filled 2022-04-13: qty 2

## 2022-04-13 MED ORDER — ALBUTEROL SULFATE (2.5 MG/3ML) 0.083% IN NEBU
10.0000 mg | INHALATION_SOLUTION | Freq: Once | RESPIRATORY_TRACT | Status: AC
Start: 2022-04-13 — End: 2022-04-13
  Administered 2022-04-13: 10 mg via RESPIRATORY_TRACT
  Filled 2022-04-13: qty 12

## 2022-04-13 MED ORDER — INSULIN ASPART 100 UNIT/ML IV SOLN
5.0000 [IU] | Freq: Once | INTRAVENOUS | Status: AC
  Administered 2022-04-13: 5 [IU] via INTRAVENOUS

## 2022-04-13 MED ORDER — INSULIN GLARGINE-YFGN 100 UNIT/ML ~~LOC~~ SOLN
5.0000 [IU] | Freq: Two times a day (BID) | SUBCUTANEOUS | Status: DC
  Administered 2022-04-13 – 2022-04-14 (×4): 5 [IU] via SUBCUTANEOUS
  Filled 2022-04-13 (×6): qty 0.05

## 2022-04-13 MED ORDER — SODIUM BICARBONATE 8.4 % IV SOLN
50.0000 meq | Freq: Once | INTRAVENOUS | Status: AC
  Administered 2022-04-13: 50 meq via INTRAVENOUS
  Filled 2022-04-13: qty 50

## 2022-04-13 MED ORDER — CALCIUM GLUCONATE 10 % IV SOLN: 1.0000 g | Freq: Once | INTRAVENOUS | Status: DC

## 2022-04-13 MED ORDER — HEPARIN (PORCINE) 2000 UNITS/L FOR CRRT
INTRAVENOUS_CENTRAL | Status: DC | PRN
  Administered 2022-04-18: 2000 mL via INTRAVENOUS_CENTRAL

## 2022-04-13 MED ORDER — PRISMASOL BGK 4/2.5 32-4-2.5 MEQ/L EC SOLN
Status: DC
  Filled 2022-04-13 (×101): qty 5000

## 2022-04-13 MED ORDER — PRISMASOL BGK 0/2.5 32-2.5 MEQ/L EC SOLN
Status: DC
  Filled 2022-04-13 (×19): qty 5000

## 2022-04-13 MED ORDER — INSULIN ASPART 100 UNIT/ML IJ SOLN
0.0000 [IU] | INTRAMUSCULAR | Status: DC
  Administered 2022-04-13 (×2): 3 [IU] via SUBCUTANEOUS
  Administered 2022-04-13: 5 [IU] via SUBCUTANEOUS
  Administered 2022-04-13: 2 [IU] via SUBCUTANEOUS
  Administered 2022-04-13: 5 [IU] via SUBCUTANEOUS
  Administered 2022-04-14 (×2): 3 [IU] via SUBCUTANEOUS
  Administered 2022-04-14 (×4): 2 [IU] via SUBCUTANEOUS
  Administered 2022-04-15: 3 [IU] via SUBCUTANEOUS
  Administered 2022-04-15: 5 [IU] via SUBCUTANEOUS

## 2022-04-13 MED ORDER — ORAL CARE MOUTH RINSE
15.0000 mL | OROMUCOSAL | Status: DC
  Administered 2022-04-13 – 2022-04-16 (×10): 15 mL via OROMUCOSAL

## 2022-04-13 MED ORDER — CALCIUM GLUCONATE-NACL 1-0.675 GM/50ML-% IV SOLN
1.0000 g | Freq: Once | INTRAVENOUS | Status: AC
  Administered 2022-04-13: 1000 mg via INTRAVENOUS
  Filled 2022-04-13: qty 50

## 2022-04-13 MED ORDER — DEXTROSE 50 % IV SOLN
1.0000 | Freq: Once | INTRAVENOUS | Status: AC
  Administered 2022-04-13: 50 mL via INTRAVENOUS
  Filled 2022-04-13: qty 50

## 2022-04-13 MED ORDER — HEPARIN SODIUM (PORCINE) 1000 UNIT/ML DIALYSIS
1000.0000 [IU] | INTRAMUSCULAR | Status: DC | PRN
  Administered 2022-04-21: 2800 [IU] via INTRAVENOUS_CENTRAL
  Filled 2022-04-13: qty 3
  Filled 2022-04-13: qty 6
  Filled 2022-04-13: qty 4

## 2022-04-13 MED ORDER — SODIUM CHLORIDE 0.9 % IV SOLN
INTRAVENOUS | Status: DC | PRN
  Administered 2022-05-02: 100 mL via INTRAVENOUS

## 2022-04-13 NOTE — Progress Notes (Signed)
Examined patient at 1200.  Respiratory status stable. On bipap.  Sat 90s.   Calm on precedex.   Stop lasix gtt, poor response.   Abd distended, good bowel sounds.   Cont bipap for now.  Will likely need HD soon for volume if UOP doesn't improve.

## 2022-04-13 NOTE — Progress Notes (Signed)
PharmD contacted this RN regarding triglycerides levels now below '500mg'$ /dL. Orders placed to stop insulin drip. Given verbal instructions to tritiate D20 infusion per CBG checks. Recommended to titrate down by 74m/hr if CBGs are within goal.

## 2022-04-13 NOTE — Progress Notes (Signed)
Inpatient Diabetes Program Recommendations  AACE/ADA: New Consensus Statement on Inpatient Glycemic Control (2015)  Target Ranges:  Prepandial:   less than 140 mg/dL      Peak postprandial:   less than 180 mg/dL (1-2 hours)      Critically ill patients:  140 - 180 mg/dL   Lab Results  Component Value Date   GLUCAP 268 (H) 04/13/2022   HGBA1C 12.5 (H) 04/10/2022    Review of Glycemic Control  Latest Reference Range & Units 04/13/22 02:06 04/13/22 03:18 04/13/22 04:05 04/13/22 05:02 04/13/22 06:13 04/13/22 07:32  Glucose-Capillary 70 - 99 mg/dL 195 (H) 218 (H) 216 (H) 246 (H) 244 (H) 268 (H)  Diabetes history: DM 2 Outpatient Diabetes medications:  Jardiance 10 mg q AM, Kerendia 10 mg daily Current orders for Inpatient glycemic control:  Novolog 0-9 units q 4 hours Semglee 5 units bid Inpatient Diabetes Program Recommendations:    Note that patient off insulin drip. A1C=12.5% indicating that he likely will need insulin at discharge.  Consider increasing Semglee to 10 units bid (0.15 units/kg).   Thanks,  Adah Perl, RN, BC-ADM Inpatient Diabetes Coordinator Pager [phone]   (8a-5p)

## 2022-04-13 NOTE — Procedures (Signed)
Central Venous Catheter Insertion Procedure Note  Mathew Lara  [id number]  [birth year]  Date:04/13/22  Time:10:39 AM   Provider Performing:Nashaun Hillmer A Denishia Citro   Procedure: Insertion of Non-tunneled Central Venous Catheter(36556)with US guidance (97026)    Indication(s) Hemodialysis  Consent Risks of the procedure as well as the alternatives and risks of each were explained to the patient and/or caregiver.  Consent for the procedure was obtained and is signed in the bedside chart  Anesthesia Topical only with 1% lidocaine   Timeout Verified patient identification, verified procedure, site/side was marked, verified correct patient position, special equipment/implants available, medications/allergies/relevant history reviewed, required imaging and test results available.  Sterile Technique Maximal sterile technique including full sterile barrier drape, hand hygiene, sterile gown, sterile gloves, mask, hair covering, sterile ultrasound probe cover (if used).  Procedure Description Area of catheter insertion was cleaned with chlorhexidine and draped in sterile fashion.   With real-time ultrasound guidance a HD catheter was placed into the right internal jugular vein.  Nonpulsatile blood flow and easy flushing noted in all ports.  The catheter was sutured in place and sterile dressing applied.  Complications/Tolerance None; patient tolerated the procedure well. Chest X-ray is ordered to verify placement for internal jugular or subclavian cannulation.  Chest x-ray is not ordered for femoral cannulation.  EBL Minimal  Specimen(s) None

## 2022-04-13 NOTE — Progress Notes (Signed)
NAME:  Mathew Lara, MRN:  [id number], DOB:  Mar 27, 1961, LOS: 3 ADMISSION DATE:  04/10/2022, CONSULTATION DATE:  8/5 REFERRING MD:  Darrell Jewel, CHIEF COMPLAINT: Abdominal pain  History of Present Illness:  KEEFE Lara is an 61 y.o. M who presented to the [facility] ED on 8/5 with a chief complaint of abdominal pain.  They have a past medical history of alcohol abuse, anxiety, CKD 3, COPD, OSA, type 2 diabetes, hypertension, hyperlipidemia, hep C  ED work-up revealed DKA, acute pancreatitis, AKI.  He was admitted to the hospital service.  Overnight he developed continuing respiratory distress, shock.   PCCM was consulted for transfer to [facility] for further management.  Still requiring pressors, abdominal pain is improved  Pertinent  Medical History  Alcohol abuse, anxiety, CKD 3, COPD, OSA, type 2 diabetes, hypertension, hyperlipidemia, hep C  Significant Hospital Events: Including procedures, antibiotic start and stop dates in addition to other pertinent events   8/5 presented to [facility] ED, transferred [facility] 8/7 on pressors  8/8 on BiPAP, worsening renal parameters  Interim History / Subjective:   Sedated, on vent Arousable Not able to follow commands Off pressors Off Endo tool  Objective   Blood pressure 109/71, pulse 94, temperature 98.1 F (36.7 C), temperature source Axillary, resp. rate (!) 23, height '5\' 8"'$  (1.727 m), weight 132.8 kg, SpO2 94 %. CVP:  [0 mmHg] 0 mmHg  Vent Mode: BIPAP;PCV FiO2 (%):  [40 %-60 %] 50 % Set Rate:  [15 bmp] 15 bmp PEEP:  [5 cmH20] 5 cmH20   Intake/Output Summary (Last 24 hours) at 04/13/2022 0751 Last data filed at 04/13/2022 0700 Gross per 24 hour  Intake 2081.07 ml  Output 375 ml  Net 1706.07 ml   Filed Weights   04/10/22 0408 04/11/22 0245  Weight: 134.5 kg 132.8 kg    Examination: General: On BiPAP, does appear comfortable HEENT: BiPAP mask in place Neuro: Not able to follow commands  CV: S1-S2  appreciated, no murmur PULM: Decreased air movement at the bases bilaterally GI: Bowel sounds appreciated Extremities: No edema, no clubbing Skin:  no rashes or lesions noted  Labs/imaging  Potassium of 6, bicarb 18, BUN 57, creatinine 7.36  Beta hydroxybutyric acid 1.76 > 0.21.  UA with ketones, + glucose Ethanol within normal limits  Lipase now 128  No growth on blood cultures  Triglycerides 1864, cholesterol 350  WBC 16.2  Chest x-ray, hypoventilated lung fields  CT abdomen pelvis concerning for acute pancreatitis, no biliary dilation, status postcholecystectomy Right upper quadrant ultrasound with hepatic steatosis Chest x-ray: Opacity of left lung, small right pleural effusion, no pneumothorax, no obvious infiltrate EKG: Sinus tach, no ST changes noted  Resolved Hospital Problem list     Assessment & Plan:   Acute kidney injury on chronic kidney disease Appears to be worsening -Appreciate renal follow-up -Will likely need CRRT -Decreased urine output -Maintain renal perfusion -Avoid nephrotoxic agents  Type 2 diabetes DKA -Off Endo tool  Pancreatitis -Parameters appears improving -Improved lipase  Multifactorial shock -Pain management -Off pressors  History of interstitial lung disease from prior COVID infection Underlying chronic obstructive pulmonary disease Underlying obstructive sleep apnea -Patient of Dr. Halford Chessman as outpatient -Uses BiPAP with oxygen supplementation at 3 L  History of hypertension -Hold home antihypertensives  History of hepatitis C with advanced liver fibrosis -Followed by Dr. Abbey Chatters with [facility] gastroenterology -Monitor closely  History of diastolic heart failure -Last echocardiogram 12/25/2019, ejection fraction of 60-65%  Best Practice (right click and "Reselect all SmartList Selections" daily)   Diet/type: NPO DVT prophylaxis: prophylactic heparin  GI prophylaxis: H2B Lines: N/A Foley:  Yes, and it is still  needed Code Status:  full code Last date of multidisciplinary goals of care discussion [pending]  Labs   CBC: Recent Labs  Lab 04/10/22 0405 04/11/22 0830 04/11/22 0836 04/12/22 0448 04/12/22 2052 04/13/22 0456  WBC 14.0* 19.5*  --  13.1*  --  16.2*  NEUTROABS  --  17.4*  --  10.5*  --  13.3*  HGB 14.7 14.1 15.3 12.7* 12.2* 11.4*  HCT 45.4 43.5 45.0 38.7* 36.0* 37.1*  MCV 82.2 81.9  --  83.0  --  86.5  PLT 320 275  --  206  --  353    Basic Metabolic Panel: Recent Labs  Lab 04/11/22 1430 04/11/22 1800 04/12/22 0145 04/12/22 0448 04/12/22 0910 04/12/22 1409 04/12/22 2052 04/13/22 0456  NA 135   < > 134* 134* 135 134* 132* 131*  K 4.1   < > 4.5 4.1 4.0 4.0 5.1 6.0*  CL 106   < > 103 105 101 104  --  100  CO2 19*   < > 18* 18* 20* 22  --  18*  GLUCOSE 115*   < > 167* 150* 142* 90  --  229*  BUN 43*   < > 46* 47* 47* 49*  --  57*  CREATININE 5.21*   < > 5.70* 5.82* 6.10* 6.58*  --  7.36*  CALCIUM 7.4*   < > 7.3* 7.3* 7.5* 7.2*  --  7.3*  MG 1.8  --   --   --   --   --   --  2.1  PHOS  --   --   --   --   --   --   --  4.7*   < > = values in this interval not displayed.   GFR: Estimated Creatinine Clearance: 13.9 mL/min (A) (by C-G formula based on SCr of 7.36 mg/dL (H)). Recent Labs  Lab 04/10/22 0405 04/10/22 0534 04/10/22 0703 04/10/22 0906 04/10/22 2300 04/11/22 0345 04/11/22 0400 04/11/22 0830 04/11/22 1430 04/12/22 0448 04/13/22 0456  PROCALCITON  --   --  0.54  --   --  10.42  --   --   --  5.37  --   WBC 14.0*  --   --   --   --   --   --  19.5*  --  13.1* 16.2*  LATICACIDVEN 5.1*   < >  --    < > 2.4*  --  2.4* 2.6* 1.5  --   --    < > = values in this interval not displayed.    Liver Function Tests: Recent Labs  Lab 04/10/22 0405 04/13/22 0456  AST 26  --   ALT 19  --   ALKPHOS 99  --   BILITOT 0.9  --   PROT 9.4*  --   ALBUMIN 3.9 2.3*   Recent Labs  Lab 04/10/22 0405 04/12/22 0448 04/13/22 0456  LIPASE 2,291* 312* 128*   No  results for input(s): "AMMONIA" in the last 168 hours.  ABG    Component Value Date/Time   PHART 7.240 (L) 04/12/2022 2052   PCO2ART 44.2 04/12/2022 2052   PO2ART 110 (H) 04/12/2022 2052   HCO3 19.1 (L) 04/12/2022 2052   TCO2 20 (L) 04/12/2022 2052   ACIDBASEDEF 8.0 (H) 04/12/2022 2052  O2SAT 97 04/12/2022 2052     Coagulation Profile: No results for input(s): "INR", "PROTIME" in the last 168 hours.  Cardiac Enzymes: No results for input(s): "CKTOTAL", "CKMB", "CKMBINDEX", "TROPONINI" in the last 168 hours.  HbA1C: Hgb A1c MFr Bld  Date/Time Value Ref Range Status  04/10/2022 04:05 AM 12.5 (H) 4.8 - 5.6 % Final    Comment:    (NOTE) Pre diabetes:          5.7%-6.4%  Diabetes:              >6.4%  Glycemic control for   <7.0% adults with diabetes     CBG: Recent Labs  Lab 04/13/22 0318 04/13/22 0405 04/13/22 0502 04/13/22 0613 04/13/22 0732  GLUCAP 218* 216* 246* 244* 268*    Review of Systems:   Positives in bold  Gen: fever, chills, weight change, fatigue, night sweats HEENT:  blurred vision, double vision, hearing loss, tinnitus, sinus congestion, rhinorrhea, sore throat, neck stiffness, dysphagia PULM:  shortness of breath, cough, sputum production, hemoptysis, wheezing CV: chest pain, edema, orthopnea, paroxysmal nocturnal dyspnea, palpitations GI:  abdominal pain, nausea, vomiting, diarrhea, hematochezia, melena, constipation, change in bowel habits GU: dysuria, hematuria, polyuria, oliguria, urethral discharge Endocrine: hot or cold intolerance, polyuria, polyphagia or appetite change Derm: rash, dry skin, scaling or peeling skin change Heme: easy bruising, bleeding, bleeding gums Neuro: headache, numbness, weakness, slurred speech, loss of memory or consciousness   Past Medical History:  He,  has a past medical history of Alcohol abuse, Anxiety, Bronchitis, Chronic hip pain, CKD (chronic kidney disease) stage 3, GFR 30-59 ml/min (HCC), COPD  (chronic obstructive pulmonary disease) ([place]), DKA (diabetic ketoacidosis) ([place]) (04/10/2022), Essential hypertension, Hepatitis C, Mixed hyperlipidemia, and Sleep apnea.   Surgical History:   Past Surgical History:  Procedure Laterality Date   BALLOON DILATION N/A 07/06/2021   Procedure: BALLOON DILATION;  Surgeon: [name], DO;  Location: [facility] END[facility] SUITE;  Service: Endoscopy;  Laterality: N/A;   BIOPSY  07/06/2021   Procedure: BIOPSY;  Surgeon: [name], DO;  Location: [facility];  Service: Endoscopy;;   CATARACT EXTRACTION Right    CHOLECYSTECTOMY N/A 03/23/2021   Procedure: LAPAROSCOPIC CHOLECYSTECTOMY;  Surgeon: [name], MD;  Location: [facility] ORS;  Service: General;  Laterality: N/A;   COLONOSCOPY N/A 01/23/2014   SLF:The LEFT COLON IS EXTREMELY redundant/TWO RECTAL POLYPS REMOVED/SMALL VOLUME RECTAL BLEEDING MOST LIKELY DUE TO Small internal hemorrhoids. path with prolapse type polyp of benign-mucosa. no adenomas   COLONOSCOPY WITH PROPOFOL N/A 07/06/2021   non-bleeding internal hemorrhoids, one 8 mm polyp in sigmod s/p removal and clip placed. One 5 mm polyp at recto-sigmoid s/p removal. 3 year surveillance. Tubular adenomas   ESOPHAGOGASTRODUODENOSCOPY N/A 01/23/2014   TGG:YIRSWNIO ring at the gastroesophageal junction/Medium sized hiatal hernia/NDYSPEPSIA MOST LIKELY DUE TO GERD/MILD Non-erosive gastritis   ESOPHAGOGASTRODUODENOSCOPY N/A 04/18/2018   food impaction, small hiatal hernia   ESOPHAGOGASTRODUODENOSCOPY (EGD) WITH PROPOFOL N/A 07/06/2021   moderate Schatzki ring s/p dilation, gastritis s/p biopsy, normal duodenum. Negative H.pylori.   HEMORRHOID BANDING  01/23/2014   Procedure: HEMORRHOID BANDING;  Surgeon: Danie Binder, MD;  Location: [facility];  Service: Endoscopy;;   JOINT REPLACEMENT Right 2017   LIVER BIOPSY N/A 03/23/2021   Procedure: LAPAROSCOPIC LIVER BIOPSY;  Surgeon: [name], MD;  Location: [facility] ORS;  Service: General;   Laterality: N/A;   MOUTH SURGERY     POLYPECTOMY  07/06/2021   Procedure: POLYPECTOMY;  Surgeon: Hurshel Keys  K, DO;  Location: [facility];  Service: Endoscopy;;   TOTAL HIP ARTHROPLASTY Right 06/04/2016   Procedure: RIGHT TOTAL HIP ARTHROPLASTY ANTERIOR APPROACH;  Surgeon: Mcarthur Rossetti, MD;  Location: [facility];  Service: Orthopedics;  Laterality: Right;   TOTAL HIP ARTHROPLASTY Left 03/02/2019   Procedure: LEFT TOTAL HIP ARTHROPLASTY ANTERIOR APPROACH;  Surgeon: Mcarthur Rossetti, MD;  Location: [facility] ORS;  Service: Orthopedics;  Laterality: Left;     Social History:   reports that he quit smoking about 7 years ago. His smoking use included cigarettes. He started smoking about 50 years ago. He has a 42.00 pack-year smoking history. He has been exposed to tobacco smoke. He has never used smokeless tobacco. He reports that he does not currently use alcohol. He reports that he does not use drugs.   Family History:  His family history includes Cancer in his mother. There is no history of Colon cancer or Colon polyps.   Allergies No Known Allergies   Home Medications  Prior to Admission medications   Medication Sig Start Date End Date Taking? Authorizing Provider  albuterol (PROAIR HFA) 108 (90 Base) MCG/ACT inhaler Inhale 2 puffs into the lungs every 4 (four) hours as needed for wheezing or shortness of breath. 02/15/22   Chesley Mires, MD  amLODipine (NORVASC) 10 MG tablet Take 10 mg by mouth daily. 02/05/19   [provider]  amoxicillin-clavulanate (AUGMENTIN) 875-125 MG tablet Take 1 tablet by mouth 2 (two) times daily. 03/16/22   Chesley Mires, MD  aspirin EC 81 MG tablet Take 81 mg by mouth daily.    [provider]  cholecalciferol (VITAMIN D) 25 MCG (1000 UNIT) tablet Take 1,000 Units by mouth daily.    [provider]  DULoxetine (CYMBALTA) 60 MG capsule Take 60 mg by mouth daily. 06/15/21   [provider]  hydrOXYzine  (ATARAX/VISTARIL) 25 MG tablet Take 50 mg by mouth at bedtime.    [provider]  loratadine (CLARITIN) 10 MG tablet Take 1 tablet (10 mg total) by mouth daily. 09/02/19   Barton Dubois, MD  losartan (COZAAR) 50 MG tablet Take 50 mg by mouth daily. 02/11/20   [provider]  oxyCODONE (OXY IR/ROXICODONE) 5 MG immediate release tablet Take 5 mg by mouth every 8 (eight) hours as needed for moderate pain. 02/25/20   [provider]  pantoprazole (PROTONIX) 40 MG tablet Take 1 tablet (40 mg total) by mouth 2 (two) times daily before a meal. 30 minutes before breakfast 03/08/22 09/04/22  Annitta Needs, NP  sildenafil (VIAGRA) 100 MG tablet Take 100 mg by mouth daily as needed for erectile dysfunction. 04/21/21   [provider]   The patient is critically ill with multiple organ systems failure and requires high complexity decision making for assessment and support, frequent evaluation and titration of therapies, application of advanced monitoring technologies and extensive interpretation of multiple databases. Critical Care Time devoted to patient care services described in this note independent of APP/resident time (if applicable)  is 40 minutes.   Sherrilyn Rist MD [facility] Personal pager: See Amion If unanswered, please page CCM On-call: [phone]

## 2022-04-13 NOTE — Progress Notes (Signed)
Patient ID: Mathew Lara, male   DOB: 1960/11/21, 61 y.o.   MRN: [id number] S: Developed worsening SOB and started on BiPAP and precedex yesterday afternoon.   Tolerated it overnight. O:BP 118/80 (BP Location: Right Arm)   Pulse 96   Temp 98.1 F (36.7 C) (Axillary)   Resp (!) 22   Ht '5\' 8"'  (1.727 m)   Wt 132.8 kg   SpO2 95%   BMI 44.52 kg/m   Intake/Output Summary (Last 24 hours) at 04/13/2022 0853 Last data filed at 04/13/2022 0800 Gross per 24 hour  Intake 2044.37 ml  Output 375 ml  Net 1669.37 ml   Intake/Output: I/O last 3 completed shifts: In: 3500.7 [I.V.:3380.1; Other:100; IV Piggyback:20.6] Out: 375 [Urine:375]  Intake/Output this shift:  Total I/O In: 48.6 [I.V.:33.3; IV Piggyback:15.3] Out: -  Weight change:  Gen: resting comfortably wearing BiPAP CVS:RRR Resp: occ rhonchi Abd: +BS, soft, NT/ND Ext: trace pretibial edema  Recent Labs  Lab 04/10/22 0405 04/10/22 0906 04/11/22 1800 04/11/22 2100 04/12/22 0145 04/12/22 0448 04/12/22 0910 04/12/22 1409 04/12/22 2052 04/13/22 0456  NA 125*   < > 134* 135 134* 134* 135 134* 132* 131*  K 5.0   < > 4.4 4.4 4.5 4.1 4.0 4.0 5.1 6.0*  CL 89*   < > 106 105 103 105 101 104  --  100  CO2 16*   < > 19* 18* 18* 18* 20* 22  --  18*  GLUCOSE 774*   < > 166* 178* 167* 150* 142* 90  --  229*  BUN 44*   < > 43* 44* 46* 47* 47* 49*  --  57*  CREATININE 3.33*   < > 5.26* 5.37* 5.70* 5.82* 6.10* 6.58*  --  7.36*  ALBUMIN 3.9  --   --   --   --   --   --   --   --  2.3*  CALCIUM 9.4   < > 7.4* 7.5* 7.3* 7.3* 7.5* 7.2*  --  7.3*  PHOS  --   --   --   --   --   --   --   --   --  4.7*  AST 26  --   --   --   --   --   --   --   --   --   ALT 19  --   --   --   --   --   --   --   --   --    < > = values in this interval not displayed.   Liver Function Tests: Recent Labs  Lab 04/10/22 0405 04/13/22 0456  AST 26  --   ALT 19  --   ALKPHOS 99  --   BILITOT 0.9  --   PROT 9.4*  --   ALBUMIN 3.9 2.3*   Recent Labs   Lab 04/10/22 0405 04/12/22 0448 04/13/22 0456  LIPASE 2,291* 312* 128*   No results for input(s): "AMMONIA" in the last 168 hours. CBC: Recent Labs  Lab 04/10/22 0405 04/11/22 0830 04/11/22 0836 04/12/22 0448 04/12/22 2052 04/13/22 0456  WBC 14.0* 19.5*  --  13.1*  --  16.2*  NEUTROABS  --  17.4*  --  10.5*  --  13.3*  HGB 14.7 14.1   < > 12.7* 12.2* 11.4*  HCT 45.4 43.5   < > 38.7* 36.0* 37.1*  MCV 82.2 81.9  --  83.0  --  86.5  PLT 320 275  --  206  --  233   < > = values in this interval not displayed.   Cardiac Enzymes: No results for input(s): "CKTOTAL", "CKMB", "CKMBINDEX", "TROPONINI" in the last 168 hours. CBG: Recent Labs  Lab 04/13/22 0318 04/13/22 0405 04/13/22 0502 04/13/22 0613 04/13/22 0732  GLUCAP 218* 216* 246* 244* 268*    Iron Studies: No results for input(s): "IRON", "TIBC", "TRANSFERRIN", "FERRITIN" in the last 72 hours. Studies/Results: DG Chest Port 1 View  Result Date: 04/13/2022 CLINICAL DATA:  61 year old male with congestive heart failure. EXAM: PORTABLE CHEST 1 VIEW COMPARISON:  Portable chest 04/12/2022 and earlier. Including recent high-resolution chest CT 03/12/2022. FINDINGS: Portable AP semi upright view at 0535 hours. Lower lung volumes. Elevation of the right hemidiaphragm is chronic and not significantly changed. Coarse bilateral pulmonary opacity appears stable from the CT last month, with underlying bullous paraseptal emphysema and bullae wall thickening greater on the left. No pneumothorax, pulmonary edema, pleural effusion or areas of worsening ventilation. Paucity of bowel gas in the upper abdomen. No acute osseous abnormality identified. IMPRESSION: Ongoing coarse bilateral pulmonary opacity felt related to the abnormal bullous emphysema with possible super infection demonstrated by CT last month (please see that report). No significant improvement. Electronically Signed   By: Genevie Ann M.D.   On: 04/13/2022 05:58   DG Chest Port 1  View  Result Date: 04/12/2022 CLINICAL DATA:  Shortness of breath EXAM: PORTABLE CHEST 1 VIEW COMPARISON:  04/10/2022 FINDINGS: Mildly increased opacities bilaterally likely pulmonary edema. Left midlung opacity is unchanged. Mild cardiomegaly. No pleural effusion. IMPRESSION: Cardiomegaly and mild pulmonary edema. Unchanged left midlung opacity. Electronically Signed   By: Ulyses Jarred M.D.   On: 04/12/2022 19:47   DG Abd Portable 1V  Result Date: 04/11/2022 CLINICAL DATA:  Abdominal distension. EXAM: PORTABLE ABDOMEN - 1 VIEW COMPARISON:  04/10/2022 FINDINGS: Exam detail diminished secondary to body habitus. Possibly a bowel gas. No dilated loops of bowel noted. Status post bilateral hip arthroplasty. Left groin central venous catheter is identified with tip projecting over the left sacrum. IMPRESSION: 1. Limited exam secondary body habitus and portable technique. 2. Paucity of bowel gas.  No dilated bowel loops noted. 3. Left groin central venous catheter tip projects over the left sacrum. Electronically Signed   By: Kerby Moors M.D.   On: 04/11/2022 11:30    arformoterol  15 mcg Nebulization BID   aspirin EC  81 mg Oral Daily   budesonide (PULMICORT) nebulizer solution  0.5 mg Nebulization BID   Chlorhexidine Gluconate Cloth  6 each Topical Q0600   heparin  5,000 Units Subcutaneous Q8H   insulin aspart  0-9 Units Subcutaneous Q4H   insulin glargine-yfgn  5 Units Subcutaneous BID   mupirocin ointment  1 Application Nasal BID   senna-docusate  1 tablet Oral BID   sodium chloride flush  10-40 mL Intracatheter Q12H    BMET    Component Value Date/Time   NA 131 (L) 04/13/2022 0456   NA 134 03/16/2022 1050   K 6.0 (H) 04/13/2022 0456   CL 100 04/13/2022 0456   CO2 18 (L) 04/13/2022 0456   GLUCOSE 229 (H) 04/13/2022 0456   BUN 57 (H) 04/13/2022 0456   BUN 16 03/16/2022 1050   CREATININE 7.36 (H) 04/13/2022 0456   CALCIUM 7.3 (L) 04/13/2022 0456   GFRNONAA 8 (L) 04/13/2022 0456   GFRAA  >60 11/26/2019 1316   CBC    Component Value Date/Time  WBC 16.2 (H) 04/13/2022 0456   RBC 4.29 04/13/2022 0456   HGB 11.4 (L) 04/13/2022 0456   HGB 14.0 03/16/2022 1050   HCT 37.1 (L) 04/13/2022 0456   HCT 42.4 03/16/2022 1050   PLT 233 04/13/2022 0456   PLT 258 03/16/2022 1050   MCV 86.5 04/13/2022 0456   MCV 80 03/16/2022 1050   MCH 26.6 04/13/2022 0456   MCHC 30.7 04/13/2022 0456   RDW 16.7 (H) 04/13/2022 0456   RDW 16.5 (H) 03/16/2022 1050   LYMPHSABS 0.7 04/13/2022 0456   LYMPHSABS 1.0 03/16/2022 1050   MONOABS 1.8 (H) 04/13/2022 0456   EOSABS 0.2 04/13/2022 0456   EOSABS 0.4 03/16/2022 1050   BASOSABS 0.1 04/13/2022 0456   BASOSABS 0.0 03/16/2022 1050    Assessment/Plan:  AKI/CKD stage IIIb-IV - presumably ischemic ATN in setting of acute pancreatitis, hypotension, pressor support, with concomitant ARB, SGLT-2 inhibitor and finerenone.  Initially non-oliguric but now UOP has decreased since being placed on pressors.   Continue to hold losartan, Jard[place], and [place].   UOP and renal function continue to worsen, as well as respiratory status.  Discussed with Dr. Ander Slade and we will start CRRT today after HD catheter placed. Renal dose meds for eGFR <15 mL/min.   Avoid nephrotoxic agents such as IV contrast, NSAIDs, and phosphate containing bowel preps (FLEETS).   Preferred narcotics are hydromorphone, fentanyl, and methadone.  Morphine should be avoided. Acute pancreatitis - due to hypertriglyceridemia.  DKA - likely due to SGLT-2 inhibitor.   Shock - likely due to pancreatitis.  Currently on levophed. Acute hypoxic respiratory failure - with underlying COPD and OSA.  Per PCCM HTN - hold bp meds Hep C with advanced liver fibrosis Chronic diastolic CHF - stable.  Donetta Potts, MD [facility]

## 2022-04-13 NOTE — TOC Progression Note (Signed)
Transition of Care [facility]) - Progression Note    Patient Details  Name: Mathew Lara MRN: [id number] Date of Birth: 1961/01/25  Transition of Care [facility] CM/SW Contact  Tom-Johnson, Renea Ee, RN Phone Number: 04/13/2022, 4:47 PM  Clinical Narrative:     CM spoke with patient at bedside about needs for post hospital transition. Admitted for DKA. Currently on BIPAP, Precedex, Levo, Heparin drip, CRRT. From home with sister,has one daughter and eight siblings. Does not drive, family transports to and from appointments. Has a cane and walker at home. States he has home O2 as needed but could not remember name of Company.   PCP is [name], MD and uses [facility] in [place].  No TOC needs or recommendations noted at this time. CM   to follow with needs as patient progresses with care.     Barriers to Discharge: Continued Medical Work up  Expected Discharge Plan and Services                                                 Social Determinants of Health (SDOH) Interventions    Readmission Risk Interventions     No data to display

## 2022-04-14 DIAGNOSIS — N179 Acute kidney failure, unspecified: Secondary | ICD-10-CM | POA: Diagnosis not present

## 2022-04-14 DIAGNOSIS — F1011 Alcohol abuse, in remission: Secondary | ICD-10-CM | POA: Diagnosis not present

## 2022-04-14 DIAGNOSIS — E7800 Pure hypercholesterolemia, unspecified: Secondary | ICD-10-CM | POA: Diagnosis not present

## 2022-04-14 DIAGNOSIS — K8500 Idiopathic acute pancreatitis without necrosis or infection: Secondary | ICD-10-CM | POA: Diagnosis not present

## 2022-04-14 LAB — TRIGLYCERIDES
Triglycerides: 436 mg/dL — ABNORMAL HIGH (ref <150)
Triglycerides: 422 mg/dL — ABNORMAL HIGH (ref <150)
Triglycerides: 417 mg/dL — ABNORMAL HIGH (ref <150)
Triglycerides: 408 mg/dL — ABNORMAL HIGH (ref <150)

## 2022-04-14 LAB — APTT: aPTT: 26 seconds (ref 24–36)

## 2022-04-14 LAB — RENAL FUNCTION PANEL
Albumin: 1.9 g/dL — ABNORMAL LOW (ref 3.5–5.0)
Albumin: 2 g/dL — ABNORMAL LOW (ref 3.5–5.0)
Anion gap: 12 (ref 5–15)
Anion gap: 14 (ref 5–15)
BUN: 42 mg/dL — ABNORMAL HIGH (ref 8–23)
BUN: 37 mg/dL — ABNORMAL HIGH (ref 8–23)
CO2: 22 mmol/L (ref 22–32)
CO2: 22 mmol/L (ref 22–32)
Calcium: 7.8 mg/dL — ABNORMAL LOW (ref 8.9–10.3)
Calcium: 8.4 mg/dL — ABNORMAL LOW (ref 8.9–10.3)
Chloride: 102 mmol/L (ref 98–111)
Chloride: 102 mmol/L (ref 98–111)
Creatinine, Ser: 5.13 mg/dL — ABNORMAL HIGH (ref 0.61–1.24)
Creatinine, Ser: 4.07 mg/dL — ABNORMAL HIGH (ref 0.61–1.24)
GFR, Estimated: 12 mL/min — ABNORMAL LOW (ref >60)
GFR, Estimated: 16 mL/min — ABNORMAL LOW (ref >60)
Glucose, Bld: 195 mg/dL — ABNORMAL HIGH (ref 70–99)
Glucose, Bld: 185 mg/dL — ABNORMAL HIGH (ref 70–99)
Phosphorus: 4.9 mg/dL — ABNORMAL HIGH (ref 2.5–4.6)
Phosphorus: 4.4 mg/dL (ref 2.5–4.6)
Potassium: 4.7 mmol/L (ref 3.5–5.1)
Potassium: 4.6 mmol/L (ref 3.5–5.1)
Sodium: 136 mmol/L (ref 135–145)
Sodium: 138 mmol/L (ref 135–145)

## 2022-04-14 LAB — CBC WITH DIFFERENTIAL/PLATELET
Abs Immature Granulocytes: 0.17 10*3/uL — ABNORMAL HIGH (ref 0.00–0.07)
Basophils Absolute: 0 10*3/uL (ref 0.0–0.1)
Basophils Relative: 0 %
Eosinophils Absolute: 0.3 10*3/uL (ref 0.0–0.5)
Eosinophils Relative: 3 %
HCT: 32.9 % — ABNORMAL LOW (ref 39.0–52.0)
Hemoglobin: 10.4 g/dL — ABNORMAL LOW (ref 13.0–17.0)
Immature Granulocytes: 2 %
Lymphocytes Relative: 4 %
Lymphs Abs: 0.5 10*3/uL — ABNORMAL LOW (ref 0.7–4.0)
MCH: 26.9 pg (ref 26.0–34.0)
MCHC: 31.6 g/dL (ref 30.0–36.0)
MCV: 85.2 fL (ref 80.0–100.0)
Monocytes Absolute: 1.2 10*3/uL — ABNORMAL HIGH (ref 0.1–1.0)
Monocytes Relative: 11 %
Neutro Abs: 8.8 10*3/uL — ABNORMAL HIGH (ref 1.7–7.7)
Neutrophils Relative %: 80 %
Platelets: 166 10*3/uL (ref 150–400)
RBC: 3.86 MIL/uL — ABNORMAL LOW (ref 4.22–5.81)
RDW: 16.6 % — ABNORMAL HIGH (ref 11.5–15.5)
WBC: 11 10*3/uL — ABNORMAL HIGH (ref 4.0–10.5)
nRBC: 0.3 % — ABNORMAL HIGH (ref 0.0–0.2)

## 2022-04-14 LAB — EXPECTORATED SPUTUM ASSESSMENT W GRAM STAIN, RFLX TO RESP C

## 2022-04-14 LAB — GLUCOSE, CAPILLARY
Glucose-Capillary: 277 mg/dL — ABNORMAL HIGH (ref 70–99)
Glucose-Capillary: 178 mg/dL — ABNORMAL HIGH (ref 70–99)
Glucose-Capillary: 178 mg/dL — ABNORMAL HIGH (ref 70–99)
Glucose-Capillary: 180 mg/dL — ABNORMAL HIGH (ref 70–99)
Glucose-Capillary: 172 mg/dL — ABNORMAL HIGH (ref 70–99)
Glucose-Capillary: 201 mg/dL — ABNORMAL HIGH (ref 70–99)
Glucose-Capillary: 236 mg/dL — ABNORMAL HIGH (ref 70–99)

## 2022-04-14 LAB — MAGNESIUM: Magnesium: 2.2 mg/dL (ref 1.7–2.4)

## 2022-04-14 NOTE — Care Management Important Message (Signed)
Important Message  Patient Details  Name: Mathew Lara MRN: [id number] Date of Birth: 1960-10-24   Medicare Important Message Given:  Yes     Maurita Havener 04/14/2022, 8:26 AM

## 2022-04-14 NOTE — Progress Notes (Signed)
Patient ID: Mathew Lara, male   DOB: Mar 06, 1961, 61 y.o.   MRN: [id number] S: Started CRRT yesterday and tolerating it well. O:BP (!) 84/65   Pulse 89   Temp 99.4 F (37.4 C) (Axillary)   Resp (!) 27   Ht _0  (1.727 m)   Wt (!) 136.2 kg   SpO2 97%   BMI 45.66 kg/m   Intake/Output Summary (Last 24 hours) at 04/14/2022 1115 Last data filed at 04/14/2022 1035 Gross per 24 hour  Intake 777.95 ml  Output 3061.5 ml  Net -2283.55 ml   Intake/Output: I/O last 3 completed shifts: In: 1960.7 [I.V.:1910.7; IV Piggyback:50] Out: 3600.5 [Urine:2040]  Intake/Output this shift:  Total I/O In: 112.3 [I.V.:112.3] Out: 286 [Urine:15] Weight change:  CBJ:SEGBTDV comfortably on BiPAP CVS: RRR Resp: decreased air movement bilaterally Abd: distended, +BS, soft, NT Ext:no edema  Recent Labs  Lab 04/10/22 0405 04/10/22 0906 04/12/22 0145 04/12/22 0448 04/12/22 0910 04/12/22 1409 04/12/22 2052 04/13/22 0456 04/13/22 0905 04/13/22 1623 04/14/22 0218  NA 125*   < > 134* 134* 135 134* 132* 131*  --  135 136  K 5.0   < > 4.5 4.1 4.0 4.0 5.1 6.0* 4.7 5.0  5.0 4.7  CL 89*   < > 103 105 101 104  --  100  --  103 102  CO2 16*   < > 18* 18* 20* 22  --  18*  --  21* 22  GLUCOSE 774*   < > 167* 150* 142* 90  --  229*  --  266* 195*  BUN 44*   < > 46* 47* 47* 49*  --  57*  --  56* 42*  CREATININE 3.33*   < > 5.70* 5.82* 6.10* 6.58*  --  7.36*  --  6.66* 5.13*  ALBUMIN 3.9  --   --   --   --   --   --  2.3*  --  2.0* 1.9*  CALCIUM 9.4   < > 7.3* 7.3* 7.5* 7.2*  --  7.3*  --  7.5* 7.8*  PHOS  --   --   --   --   --   --   --  4.7*  --  5.1* 4.9*  AST 26  --   --   --   --   --   --   --   --   --   --   ALT 19  --   --   --   --   --   --   --   --   --   --    < > = values in this interval not displayed.   Liver Function Tests: Recent Labs  Lab 04/10/22 0405 04/13/22 0456 04/13/22 1623 04/14/22 0218  AST 26  --   --   --   ALT 19  --   --   --   ALKPHOS 99  --   --   --   BILITOT  0.9  --   --   --   PROT 9.4*  --   --   --   ALBUMIN 3.9 2.3* 2.0* 1.9*   Recent Labs  Lab 04/10/22 0405 04/12/22 0448 04/13/22 0456  LIPASE 2,291* 312* 128*   No results for input(s): "AMMONIA" in the last 168 hours. CBC: Recent Labs  Lab 04/10/22 0405 04/10/22 0405 04/11/22 0830 04/11/22 0836 04/12/22 0448 04/12/22 2052 04/13/22 0456 04/14/22 0225  WBC  14.0*  --  19.5*  --  13.1*  --  16.2* 11.0*  NEUTROABS  --    < > 17.4*  --  10.5*  --  13.3* 8.8*  HGB 14.7  --  14.1   < > 12.7* 12.2* 11.4* 10.4*  HCT 45.4  --  43.5   < > 38.7* 36.0* 37.1* 32.9*  MCV 82.2  --  81.9  --  83.0  --  86.5 85.2  PLT 320  --  275  --  206  --  233 166   < > = values in this interval not displayed.   Cardiac Enzymes: No results for input(s): "CKTOTAL", "CKMB", "CKMBINDEX", "TROPONINI" in the last 168 hours. CBG: Recent Labs  Lab 04/13/22 1720 04/13/22 1921 04/13/22 2350 04/14/22 0309 04/14/22 0723  GLUCAP 244* 203* 189* 178* 178*    Iron Studies: No results for input(s): "IRON", "TIBC", "TRANSFERRIN", "FERRITIN" in the last 72 hours. Studies/Results: DG Chest Port 1 View  Result Date: 04/13/2022 CLINICAL DATA:  [id number]; central line placement EXAM: PORTABLE CHEST 1 VIEW COMPARISON:  Study performed on the same day earlier at 5:35 a.m. FINDINGS: There has been interval right transjugular central venous catheter placement with its tip in the proximal right atrium. No pneumothorax. Mild cardiomegaly, stable. There is pulmonary vascular congestion with superimposed patchy opacities in the left mid to-lower lung and to a lesser extent right lung base of likely atelectasis. Continued elevation of the right hemidiaphragm. The visualized skeletal structures are unremarkable. IMPRESSION: 1. There has been interval insertion of right transjugular central venous catheter with its tip in the proximal right atrium. No pneumothorax. 2.  Mild cardiomegaly, Stable. 3. Pulmonary vascular congestion with  patchy opacities likely atelectasis greater on the left lung, stable. Electronically Signed   By: Frazier Richards M.D.   On: 04/13/2022 10:08   DG Chest Port 1 View  Result Date: 04/13/2022 CLINICAL DATA:  61 year old male with congestive heart failure. EXAM: PORTABLE CHEST 1 VIEW COMPARISON:  Portable chest 04/12/2022 and earlier. Including recent high-resolution chest CT 03/12/2022. FINDINGS: Portable [facility] semi upright view at 0535 hours. Lower lung volumes. Elevation of the right hemidiaphragm is chronic and not significantly changed. Coarse bilateral pulmonary opacity appears stable from the CT last month, with underlying bullous paraseptal emphysema and bullae wall thickening greater on the left. No pneumothorax, pulmonary edema, pleural effusion or areas of worsening ventilation. Paucity of bowel gas in the upper abdomen. No acute osseous abnormality identified. IMPRESSION: Ongoing coarse bilateral pulmonary opacity felt related to the abnormal bullous emphysema with possible super infection demonstrated by CT last month (please see that report). No significant improvement. Electronically Signed   By: Genevie Ann M.D.   On: 04/13/2022 05:58   DG Chest Port 1 View  Result Date: 04/12/2022 CLINICAL DATA:  Shortness of breath EXAM: PORTABLE CHEST 1 VIEW COMPARISON:  04/10/2022 FINDINGS: Mildly increased opacities bilaterally likely pulmonary edema. Left midlung opacity is unchanged. Mild cardiomegaly. No pleural effusion. IMPRESSION: Cardiomegaly and mild pulmonary edema. Unchanged left midlung opacity. Electronically Signed   By: Ulyses Jarred M.D.   On: 04/12/2022 19:47    arformoterol  15 mcg Nebulization BID   aspirin EC  81 mg Oral Daily   budesonide (PULMICORT) nebulizer solution  0.5 mg Nebulization BID   Chlorhexidine Gluconate Cloth  6 each Topical Q0600   heparin  5,000 Units Subcutaneous Q8H   insulin aspart  0-9 Units Subcutaneous Q4H   insulin glargine-yfgn  5 Units Subcutaneous  BID   mupirocin  ointment  1 Application Nasal BID   mouth rinse  15 mL Mouth Rinse 4 times per day   senna-docusate  1 tablet Oral BID   sodium chloride flush  10-40 mL Intracatheter Q12H    BMET    Component Value Date/Time   NA 136 04/14/2022 0218   NA 134 03/16/2022 1050   K 4.7 04/14/2022 0218   CL 102 04/14/2022 0218   CO2 22 04/14/2022 0218   GLUCOSE 195 (H) 04/14/2022 0218   BUN 42 (H) 04/14/2022 0218   BUN 16 03/16/2022 1050   CREATININE 5.13 (H) 04/14/2022 0218   CALCIUM 7.8 (L) 04/14/2022 0218   GFRNONAA 12 (L) 04/14/2022 0218   GFRAA >60 11/26/2019 1316   CBC    Component Value Date/Time   WBC 11.0 (H) 04/14/2022 0225   RBC 3.86 (L) 04/14/2022 0225   HGB 10.4 (L) 04/14/2022 0225   HGB 14.0 03/16/2022 1050   HCT 32.9 (L) 04/14/2022 0225   HCT 42.4 03/16/2022 1050   PLT 166 04/14/2022 0225   PLT 258 03/16/2022 1050   MCV 85.2 04/14/2022 0225   MCV 80 03/16/2022 1050   MCH 26.9 04/14/2022 0225   MCHC 31.6 04/14/2022 0225   RDW 16.6 (H) 04/14/2022 0225   RDW 16.5 (H) 03/16/2022 1050   LYMPHSABS 0.5 (L) 04/14/2022 0225   LYMPHSABS 1.0 03/16/2022 1050   MONOABS 1.2 (H) 04/14/2022 0225   EOSABS 0.3 04/14/2022 0225   EOSABS 0.4 03/16/2022 1050   BASOSABS 0.0 04/14/2022 0225   BASOSABS 0.0 03/16/2022 1050    Assessment/Plan:  AKI/CKD stage IIIb-IV - presumably ischemic ATN in setting of acute pancreatitis, hypotension, pressor support, with concomitant ARB, SGLT-2 inhibitor and finerenone.  Initially non-oliguric but now UOP has decreased since being placed on pressors.  Started on CRRT 04/13/22.  RIJ temp HD cath placed 04/13/22.  CRRT prescription:  pre and post-filter 2K/2.5Ca at 400 mL/hr, Dialysate 4K/2.5Ca at 1500 mL/hr, heparin anticoagulation, UF goal 50-100 mL/hr. Continue to hold losartan, Jardiance, and [place].   UOP improved overnight at 1700, bp dropping, will decrease UF goal to 0-50 mL/hr.  Keep MAP >65 Renal dose meds for eGFR <15 mL/min.   Avoid nephrotoxic  agents such as IV contrast, NSAIDs, and phosphate containing bowel preps (FLEETS).   Preferred narcotics are hydromorphone, fentanyl, and methadone.  Morphine should be avoided. Acute pancreatitis - due to hypertriglyceridemia.  DKA - likely due to SGLT-2 inhibitor.   Shock - likely due to pancreatitis.  Currently on levophed. Acute hypoxic respiratory failure - with underlying COPD and OSA.  Per PCCM HTN - hold bp meds Hep C with advanced liver fibrosis Chronic diastolic CHF - stable.  Donetta Potts, MD [facility]

## 2022-04-14 NOTE — Progress Notes (Signed)
NAME:  Mathew Lara, MRN:  [id number], DOB:  Jul 05, 1961, LOS: 4 ADMISSION DATE:  04/10/2022, CONSULTATION DATE:  8/5 REFERRING MD:  Darrell Jewel, CHIEF COMPLAINT: Abdominal pain  History of Present Illness:  Mathew Lara is an 61 y.o. M who presented to the [facility] ED on 8/5 with a chief complaint of abdominal pain.  They have a past medical history of alcohol abuse, anxiety, CKD 3, COPD, OSA, type 2 diabetes, hypertension, hyperlipidemia, hep C  ED work-up revealed DKA, acute pancreatitis, AKI.  He was admitted to the hospital service.  Overnight he developed continuing respiratory distress, shock.   PCCM was consulted for transfer to [facility] for further management.  Still requiring pressors, abdominal pain is improved  Pertinent  Medical History  Alcohol abuse, anxiety, CKD 3, COPD, OSA, type 2 diabetes, hypertension, hyperlipidemia, hep C  Significant Hospital Events: Including procedures, antibiotic start and stop dates in addition to other pertinent events   8/5 presented to [facility] ED, transferred [facility] 8/7 on pressors  8/8 on BiPAP, worsening renal parameters 8/9 tolerating CRRT, on BiPAP  Interim History / Subjective:   Sleepy, on Precedex No overnight events  Objective   Blood pressure 99/70, pulse 89, temperature 99.4 F (37.4 C), temperature source Axillary, resp. rate (!) 25, height '5\' 8"'$  (1.727 m), weight (!) 136.2 kg, SpO2 98 %. CVP:  [5 mmHg-43 mmHg] 43 mmHg  Vent Mode: BIPAP FiO2 (%):  [50 %] 50 % Set Rate:  [15 bmp] 15 bmp PEEP:  [5 cmH20] 5 cmH20   Intake/Output Summary (Last 24 hours) at 04/14/2022 4696 Last data filed at 04/14/2022 0700 Gross per 24 hour  Intake 814.34 ml  Output 3075.5 ml  Net -2261.16 ml   Filed Weights   04/10/22 0408 04/11/22 0245 04/14/22 0500  Weight: 134.5 kg 132.8 kg (!) 136.2 kg    Examination: General: Comfortable on BiPAP HEENT: BiPAP mask in place Neuro: Not able to follow commands, sedated CV:  S1-S2 appreciated, no murmur PULM: Decreased air movement bilaterally GI: Bowel sounds appreciated Extremities: No edema, no clubbing Skin:  no rashes or lesions noted  Labs/imaging  Potassium of 4.7, bicarb 22, BUN 57, creatinine 5.13  Lipase now 128 No growth on blood cultures Triglycerides 1864, cholesterol 350  WBC 16.2-improved to 11  Chest x-ray, hypoventilated lung fields  CT abdomen pelvis concerning for acute pancreatitis, no biliary dilation, status postcholecystectomy Right upper quadrant ultrasound with hepatic steatosis Chest x-ray: Opacity of left lung, small right pleural effusion, no pneumothorax, no obvious infiltrate EKG: Sinus tach, no ST changes noted  Resolved Hospital Problem list     Assessment & Plan:   Acute kidney injury on chronic kidney disease Stable on CRRT -Appreciate renal follow-up -He is making urine -Maintain renal perfusion -Avoid nephrotoxic agents  Type 2 diabetes DKA -Off Endo tool  Pancreatitis -Lipase did improve  Multifactorial shock -Off pressors -Continue pain management  History of interstitial lung disease from prior COVID infection Underlying chronic obstructive pulmonary disease Underlying obstructive sleep apnea -Transition to oxygen supplementation -BiPAP as needed  History of hepatitis C with advanced liver fibrosis -Followed up with Dr. Abbey Chatters with [facility] gastroenterology -Continue close monitoring  History of diastolic heart failure -Last echo 12/25/2019 reveals ejection fraction of 60 to 65%   Multifactorial reasons for his respiratory failure -Will attempt to wean as tolerated -Hopefully will not require ventilator -Will try to transition to high flow oxygen from BiPAP and use BiPAP as needed -Wean  down sedation as tolerated-currently requiring Precedex  Discussed with nephrology at bedside Best Practice (right click and "Reselect all SmartList Selections" daily)   Diet/type: NPO DVT  prophylaxis: prophylactic heparin  GI prophylaxis: H2B Lines: N/A Foley:  Yes, and it is still needed Code Status:  full code Last date of multidisciplinary goals of care discussion [pending]  Labs   CBC: Recent Labs  Lab 04/10/22 0405 04/11/22 0830 04/11/22 0836 04/12/22 0448 04/12/22 2052 04/13/22 0456 04/14/22 0225  WBC 14.0* 19.5*  --  13.1*  --  16.2* 11.0*  NEUTROABS  --  17.4*  --  10.5*  --  13.3* 8.8*  HGB 14.7 14.1 15.3 12.7* 12.2* 11.4* 10.4*  HCT 45.4 43.5 45.0 38.7* 36.0* 37.1* 32.9*  MCV 82.2 81.9  --  83.0  --  86.5 85.2  PLT 320 275  --  206  --  233 403    Basic Metabolic Panel: Recent Labs  Lab 04/11/22 1430 04/11/22 1800 04/12/22 0910 04/12/22 1409 04/12/22 2052 04/13/22 0456 04/13/22 0905 04/13/22 1623 04/14/22 0218 04/14/22 0225  NA 135   < > 135 134* 132* 131*  --  135 136  --   K 4.1   < > 4.0 4.0 5.1 6.0* 4.7 5.0  5.0 4.7  --   CL 106   < > 101 104  --  100  --  103 102  --   CO2 19*   < > 20* 22  --  18*  --  21* 22  --   GLUCOSE 115*   < > 142* 90  --  229*  --  266* 195*  --   BUN 43*   < > 47* 49*  --  57*  --  56* 42*  --   CREATININE 5.21*   < > 6.10* 6.58*  --  7.36*  --  6.66* 5.13*  --   CALCIUM 7.4*   < > 7.5* 7.2*  --  7.3*  --  7.5* 7.8*  --   MG 1.8  --   --   --   --  2.1  --   --   --  2.2  PHOS  --   --   --   --   --  4.7*  --  5.1* 4.9*  --    < > = values in this interval not displayed.   GFR: Estimated Creatinine Clearance: 20.2 mL/min (A) (by C-G formula based on SCr of 5.13 mg/dL (H)). Recent Labs  Lab 04/10/22 0703 04/10/22 0906 04/10/22 2300 04/11/22 0345 04/11/22 0400 04/11/22 0830 04/11/22 1430 04/12/22 0448 04/13/22 0456 04/14/22 0225  PROCALCITON 0.54  --   --  10.42  --   --   --  5.37 4.42  --   WBC  --   --   --   --   --  19.5*  --  13.1* 16.2* 11.0*  LATICACIDVEN  --    < > 2.4*  --  2.4* 2.6* 1.5  --   --   --    < > = values in this interval not displayed.    Liver Function  Tests: Recent Labs  Lab 04/10/22 0405 04/13/22 0456 04/13/22 1623 04/14/22 0218  AST 26  --   --   --   ALT 19  --   --   --   ALKPHOS 99  --   --   --   BILITOT 0.9  --   --   --  PROT 9.4*  --   --   --   ALBUMIN 3.9 2.3* 2.0* 1.9*   Recent Labs  Lab 04/10/22 0405 04/12/22 0448 04/13/22 0456  LIPASE 2,291* 312* 128*   No results for input(s): "AMMONIA" in the last 168 hours.  ABG    Component Value Date/Time   PHART 7.240 (L) 04/12/2022 2052   PCO2ART 44.2 04/12/2022 2052   PO2ART 110 (H) 04/12/2022 2052   HCO3 19.1 (L) 04/12/2022 2052   TCO2 20 (L) 04/12/2022 2052   ACIDBASEDEF 8.0 (H) 04/12/2022 2052   O2SAT 97 04/12/2022 2052     Coagulation Profile: No results for input(s): "INR", "PROTIME" in the last 168 hours.  Cardiac Enzymes: No results for input(s): "CKTOTAL", "CKMB", "CKMBINDEX", "TROPONINI" in the last 168 hours.  HbA1C: Hgb A1c MFr Bld  Date/Time Value Ref Range Status  04/10/2022 04:05 AM 12.5 (H) 4.8 - 5.6 % Final    Comment:    (NOTE) Pre diabetes:          5.7%-6.4%  Diabetes:              >6.4%  Glycemic control for   <7.0% adults with diabetes     CBG: Recent Labs  Lab 04/13/22 1720 04/13/22 1921 04/13/22 2350 04/14/22 0309 04/14/22 0723  GLUCAP 244* 203* 189* 178* 178*    Review of Systems:   Positives in bold  Gen: fever, chills, weight change, fatigue, night sweats HEENT:  blurred vision, double vision, hearing loss, tinnitus, sinus congestion, rhinorrhea, sore throat, neck stiffness, dysphagia PULM:  shortness of breath, cough, sputum production, hemoptysis, wheezing CV: chest pain, edema, orthopnea, paroxysmal nocturnal dyspnea, palpitations GI:  abdominal pain, nausea, vomiting, diarrhea, hematochezia, melena, constipation, change in bowel habits GU: dysuria, hematuria, polyuria, oliguria, urethral discharge Endocrine: hot or cold intolerance, polyuria, polyphagia or appetite change Derm: rash, dry skin, scaling  or peeling skin change Heme: easy bruising, bleeding, bleeding gums Neuro: headache, numbness, weakness, slurred speech, loss of memory or consciousness   Past Medical History:  He,  has a past medical history of Alcohol abuse, Anxiety, Bronchitis, Chronic hip pain, CKD (chronic kidney disease) stage 3, GFR 30-59 ml/min (HCC), COPD (chronic obstructive pulmonary disease) ([place]), DKA (diabetic ketoacidosis) ([place]) (04/10/2022), Essential hypertension, Hepatitis C, Mixed hyperlipidemia, and Sleep apnea.   Surgical History:   Past Surgical History:  Procedure Laterality Date   BALLOON DILATION N/A 07/06/2021   Procedure: BALLOON DILATION;  Surgeon: [name], DO;  Location: [facility]ITE;  Service: Endoscopy;  Laterality: N/A;   BIOPSY  07/06/2021   Procedure: BIOPSY;  Surgeon: [name], DO;  Location: [facility];  Service: Endoscopy;;   CATARACT EXTRACTION Right    CHOLECYSTECTOMY N/A 03/23/2021   Procedure: LAPAROSCOPIC CHOLECYSTECTOMY;  Surgeon: [name], MD;  Location: [facility] ORS;  Service: General;  Laterality: N/A;   COLONOSCOPY N/A 01/23/2014   SLF:The LEFT COLON IS EXTREMELY redundant/TWO RECTAL POLYPS REMOVED/SMALL VOLUME RECTAL BLEEDING MOST LIKELY DUE TO Small internal hemorrhoids. path with prolapse type polyp of benign-mucosa. no adenomas   COLONOSCOPY WITH PROPOFOL N/A 07/06/2021   non-bleeding internal hemorrhoids, one 8 mm polyp in sigmod s/p removal and clip placed. One 5 mm polyp at recto-sigmoid s/p removal. 3 year surveillance. Tubular adenomas   ESOPHAGOGASTRODUODENOSCOPY N/A 01/23/2014   WUJ:[id number] ring at the gastroesophageal junction/Medium sized hiatal hernia/NDYSPEPSIA MOST LIKELY DUE TO GERD/MILD Non-erosive gastritis   ESOPHAGOGASTRODUODENOSCOPY N/A 04/18/2018   food impaction, small hiatal hernia   ESOPHAGOGASTRODUODENOSCOPY (EGD)  WITH PROPOFOL N/A 07/06/2021   moderate Schatzki ring s/p dilation, gastritis s/p biopsy, normal duodenum.  Negative H.pylori.   HEMORRHOID BANDING  01/23/2014   Procedure: HEMORRHOID BANDING;  Surgeon: Danie Binder, MD;  Location: [facility];  Service: Endoscopy;;   JOINT REPLACEMENT Right 2017   LIVER BIOPSY N/A 03/23/2021   Procedure: LAPAROSCOPIC LIVER BIOPSY;  Surgeon: [name], MD;  Location: [facility] ORS;  Service: General;  Laterality: N/A;   MOUTH SURGERY     POLYPECTOMY  07/06/2021   Procedure: POLYPECTOMY;  Surgeon: [name], DO;  Location: [facility];  Service: Endoscopy;;   TOTAL HIP ARTHROPLASTY Right 06/04/2016   Procedure: RIGHT TOTAL HIP ARTHROPLASTY ANTERIOR APPROACH;  Surgeon: Mcarthur Rossetti, MD;  Location: [facility] OR[facility];  Service: Orthopedics;  Laterality: Right;   TOTAL HIP ARTHROPLASTY Left 03/02/2019   Procedure: LEFT TOTAL HIP ARTHROPLASTY ANTERIOR APPROACH;  Surgeon: Mcarthur Rossetti, MD;  Location: [facility] ORS;  Service: Orthopedics;  Laterality: Left;     Social History:   reports that he quit smoking about 7 years ago. His smoking use included cigarettes. He started smoking about 50 years ago. He has a 42.00 pack-year smoking history. He has been exposed to tobacco smoke. He has never used smokeless tobacco. He reports that he does not currently use alcohol. He reports that he does not use drugs.   Family History:  His family history includes Cancer in his mother. There is no history of Colon cancer or Colon polyps.   Allergies No Known Allergies   Home Medications  Prior to Admission medications   Medication Sig Start Date End Date Taking? Authorizing Provider  albuterol (PROAIR HFA) 108 (90 Base) MCG/ACT inhaler Inhale 2 puffs into the lungs every 4 (four) hours as needed for wheezing or shortness of breath. 02/15/22   Chesley Mires, MD  amLODipine (NORVASC) 10 MG tablet Take 10 mg by mouth daily. 02/05/19   [provider]  amoxicillin-clavulanate (AUGMENTIN) 875-125 MG tablet Take 1 tablet by mouth 2 (two) times daily. 03/16/22    Chesley Mires, MD  aspirin EC 81 MG tablet Take 81 mg by mouth daily.    [provider]  cholecalciferol (VITAMIN D) 25 MCG (1000 UNIT) tablet Take 1,000 Units by mouth daily.    [provider]  DULoxetine (CYMBALTA) 60 MG capsule Take 60 mg by mouth daily. 06/15/21   [provider]  hydrOXYzine (ATARAX/VISTARIL) 25 MG tablet Take 50 mg by mouth at bedtime.    [provider]  loratadine (CLARITIN) 10 MG tablet Take 1 tablet (10 mg total) by mouth daily. 09/02/19   Barton Dubois, MD  losartan (COZAAR) 50 MG tablet Take 50 mg by mouth daily. 02/11/20   [provider]  oxyCODONE (OXY IR/ROXICODONE) 5 MG immediate release tablet Take 5 mg by mouth every 8 (eight) hours as needed for moderate pain. 02/25/20   [provider]  pantoprazole (PROTONIX) 40 MG tablet Take 1 tablet (40 mg total) by mouth 2 (two) times daily before a meal. 30 minutes before breakfast 03/08/22 09/04/22  Annitta Needs, NP  sildenafil (VIAGRA) 100 MG tablet Take 100 mg by mouth daily as needed for erectile dysfunction. 04/21/21   [provider]   The patient is critically ill with multiple organ systems failure and requires high complexity decision making for assessment and support, frequent evaluation and titration of therapies, application of advanced monitoring technologies and extensive interpretation of multiple databases. Critical Care Time  devoted to patient care services described in this note independent of APP/resident time (if applicable)  is 32 minutes.   Sherrilyn Rist MD [facility] Personal pager: See Amion If unanswered, please page CCM On-call: [phone]

## 2022-04-14 NOTE — Plan of Care (Signed)
  Problem: Clinical Measurements: Goal: Respiratory complications will improve Outcome: Progressing   Problem: Coping: Goal: Level of anxiety will decrease Outcome: Progressing   

## 2022-04-14 NOTE — Progress Notes (Signed)
RT placed patient on [place] to give patient a break from the bipap. Patient tolerating fairly well at this time. RT, RN and MD at bedside to assess. RR mid-high 20's. No new orders for RT at this time per MD. RT will continue to monitor.

## 2022-04-15 DIAGNOSIS — N179 Acute kidney failure, unspecified: Secondary | ICD-10-CM | POA: Diagnosis not present

## 2022-04-15 DIAGNOSIS — K8500 Idiopathic acute pancreatitis without necrosis or infection: Secondary | ICD-10-CM | POA: Diagnosis not present

## 2022-04-15 DIAGNOSIS — F1011 Alcohol abuse, in remission: Secondary | ICD-10-CM | POA: Diagnosis not present

## 2022-04-15 DIAGNOSIS — R579 Shock, unspecified: Secondary | ICD-10-CM | POA: Diagnosis not present

## 2022-04-15 LAB — RENAL FUNCTION PANEL
Albumin: 2 g/dL — ABNORMAL LOW (ref 3.5–5.0)
Albumin: 1.9 g/dL — ABNORMAL LOW (ref 3.5–5.0)
Anion gap: 13 (ref 5–15)
Anion gap: 11 (ref 5–15)
BUN: 36 mg/dL — ABNORMAL HIGH (ref 8–23)
BUN: 33 mg/dL — ABNORMAL HIGH (ref 8–23)
CO2: 22 mmol/L (ref 22–32)
CO2: 24 mmol/L (ref 22–32)
Calcium: 8.4 mg/dL — ABNORMAL LOW (ref 8.9–10.3)
Calcium: 8.5 mg/dL — ABNORMAL LOW (ref 8.9–10.3)
Chloride: 100 mmol/L (ref 98–111)
Chloride: 102 mmol/L (ref 98–111)
Creatinine, Ser: 3.72 mg/dL — ABNORMAL HIGH (ref 0.61–1.24)
Creatinine, Ser: 3.38 mg/dL — ABNORMAL HIGH (ref 0.61–1.24)
GFR, Estimated: 18 mL/min — ABNORMAL LOW (ref >60)
GFR, Estimated: 20 mL/min — ABNORMAL LOW (ref >60)
Glucose, Bld: 243 mg/dL — ABNORMAL HIGH (ref 70–99)
Glucose, Bld: 256 mg/dL — ABNORMAL HIGH (ref 70–99)
Phosphorus: 4.6 mg/dL (ref 2.5–4.6)
Phosphorus: 3.5 mg/dL (ref 2.5–4.6)
Potassium: 4.3 mmol/L (ref 3.5–5.1)
Potassium: 4 mmol/L (ref 3.5–5.1)
Sodium: 135 mmol/L (ref 135–145)
Sodium: 137 mmol/L (ref 135–145)

## 2022-04-15 LAB — CULTURE, BLOOD (ROUTINE X 2)
Culture: NO GROWTH
Culture: NO GROWTH
Special Requests: ADEQUATE
Special Requests: ADEQUATE

## 2022-04-15 LAB — TRIGLYCERIDES
Triglycerides: 410 mg/dL — ABNORMAL HIGH (ref <150)
Triglycerides: 409 mg/dL — ABNORMAL HIGH (ref <150)

## 2022-04-15 LAB — CBC WITH DIFFERENTIAL/PLATELET
Abs Immature Granulocytes: 0.4 10*3/uL — ABNORMAL HIGH (ref 0.00–0.07)
Basophils Absolute: 0 10*3/uL (ref 0.0–0.1)
Basophils Relative: 0 %
Eosinophils Absolute: 0.7 10*3/uL — ABNORMAL HIGH (ref 0.0–0.5)
Eosinophils Relative: 5 %
HCT: 34.2 % — ABNORMAL LOW (ref 39.0–52.0)
Hemoglobin: 10.4 g/dL — ABNORMAL LOW (ref 13.0–17.0)
Lymphocytes Relative: 3 %
Lymphs Abs: 0.4 10*3/uL — ABNORMAL LOW (ref 0.7–4.0)
MCH: 26.6 pg (ref 26.0–34.0)
MCHC: 30.4 g/dL (ref 30.0–36.0)
MCV: 87.5 fL (ref 80.0–100.0)
Monocytes Absolute: 0.9 10*3/uL (ref 0.1–1.0)
Monocytes Relative: 7 %
Myelocytes: 2 %
Neutro Abs: 11 10*3/uL — ABNORMAL HIGH (ref 1.7–7.7)
Neutrophils Relative %: 82 %
Platelets: 204 10*3/uL (ref 150–400)
Promyelocytes Relative: 1 %
RBC: 3.91 MIL/uL — ABNORMAL LOW (ref 4.22–5.81)
RDW: 16.7 % — ABNORMAL HIGH (ref 11.5–15.5)
WBC: 13.4 10*3/uL — ABNORMAL HIGH (ref 4.0–10.5)
nRBC: 0.4 % — ABNORMAL HIGH (ref 0.0–0.2)
nRBC: 1 /100 WBC — ABNORMAL HIGH

## 2022-04-15 LAB — EXPECTORATED SPUTUM ASSESSMENT W GRAM STAIN, RFLX TO RESP C

## 2022-04-15 LAB — GLUCOSE, CAPILLARY
Glucose-Capillary: 201 mg/dL — ABNORMAL HIGH (ref 70–99)
Glucose-Capillary: 206 mg/dL — ABNORMAL HIGH (ref 70–99)
Glucose-Capillary: 235 mg/dL — ABNORMAL HIGH (ref 70–99)
Glucose-Capillary: 254 mg/dL — ABNORMAL HIGH (ref 70–99)
Glucose-Capillary: 274 mg/dL — ABNORMAL HIGH (ref 70–99)
Glucose-Capillary: 285 mg/dL — ABNORMAL HIGH (ref 70–99)

## 2022-04-15 LAB — APTT: aPTT: 46 seconds — ABNORMAL HIGH (ref 24–36)

## 2022-04-15 LAB — MAGNESIUM: Magnesium: 2.6 mg/dL — ABNORMAL HIGH (ref 1.7–2.4)

## 2022-04-15 MED ORDER — MIDODRINE HCL 5 MG PO TABS
10.0000 mg | ORAL_TABLET | Freq: Three times a day (TID) | ORAL | Status: DC
  Administered 2022-04-15 – 2022-04-18 (×9): 10 mg via ORAL
  Filled 2022-04-15 (×9): qty 2

## 2022-04-15 MED ORDER — ENSURE ENLIVE PO LIQD
237.0000 mL | Freq: Three times a day (TID) | ORAL | Status: DC
  Administered 2022-04-15 – 2022-04-25 (×8): 237 mL via ORAL

## 2022-04-15 MED ORDER — INSULIN ASPART 100 UNIT/ML IJ SOLN
0.0000 [IU] | INTRAMUSCULAR | Status: DC
  Administered 2022-04-15 (×2): 8 [IU] via SUBCUTANEOUS
  Administered 2022-04-15: 5 [IU] via SUBCUTANEOUS
  Administered 2022-04-16: 8 [IU] via SUBCUTANEOUS
  Administered 2022-04-16 (×2): 3 [IU] via SUBCUTANEOUS
  Administered 2022-04-16: 8 [IU] via SUBCUTANEOUS
  Administered 2022-04-16 – 2022-04-17 (×4): 5 [IU] via SUBCUTANEOUS

## 2022-04-15 MED ORDER — CHLORHEXIDINE GLUCONATE CLOTH 2 % EX PADS
6.0000 | MEDICATED_PAD | Freq: Every day | CUTANEOUS | Status: DC
  Administered 2022-04-16 – 2022-05-13 (×27): 6 via TOPICAL

## 2022-04-15 MED ORDER — INSULIN GLARGINE-YFGN 100 UNIT/ML ~~LOC~~ SOLN
10.0000 [IU] | Freq: Two times a day (BID) | SUBCUTANEOUS | Status: DC
  Administered 2022-04-15 – 2022-04-16 (×3): 10 [IU] via SUBCUTANEOUS
  Filled 2022-04-15 (×4): qty 0.1

## 2022-04-15 MED ORDER — B COMPLEX-C PO TABS
1.0000 | ORAL_TABLET | Freq: Every day | ORAL | Status: DC
  Administered 2022-04-15 – 2022-04-18 (×4): 1 via ORAL
  Filled 2022-04-15 (×4): qty 1

## 2022-04-15 NOTE — Progress Notes (Signed)
Patient ID: Mathew Lara, male   DOB: 1961-08-04, 61 y.o.   MRN: [id number] S: Feels better today O:BP (!) 86/59   Pulse 99   Temp 98.7 F (37.1 C) (Oral)   Resp 15   Ht _0  (1.727 m)   Wt (!) 136.1 kg   SpO2 90%   BMI 45.62 kg/m   Intake/Output Summary (Last 24 hours) at 04/15/2022 1106 Last data filed at 04/15/2022 1000 Gross per 24 hour  Intake 633.7 ml  Output 1686 ml  Net -1052.3 ml   Intake/Output: I/O last 3 completed shifts: In: 886.3 [P.O.:240; I.V.:606.3; Other:40] Out: 3197.5 [Urine:625]  Intake/Output this shift:  Total I/O In: 249.4 [P.O.:236; I.V.:13.4] Out: 223 [Urine:55] Weight change: -0.1 kg Gen:NAD wearing nasal cannula off BiPAP CVS:RRR Resp: occ rhonchi Abd: distended, hypoactive BS, NT Ext:no edema  Recent Labs  Lab 04/10/22 0405 04/10/22 0906 04/12/22 0910 04/12/22 1409 04/12/22 2052 04/13/22 0456 04/13/22 0905 04/13/22 1623 04/14/22 0218 04/14/22 1530 04/15/22 0351  NA 125*   < > 135 134* 132* 131*  --  135 136 138 135  K 5.0   < > 4.0 4.0 5.1 6.0* 4.7 5.0  5.0 4.7 4.6 4.3  CL 89*   < > 101 104  --  100  --  103 102 102 100  CO2 16*   < > 20* 22  --  18*  --  21* _1 GLUCOSE 774*   < > 142* 90  --  229*  --  266* 195* 185* 243*  BUN 44*   < > 47* 49*  --  57*  --  56* 42* 37* 36*  CREATININE 3.33*   < > 6.10* 6.58*  --  7.36*  --  6.66* 5.13* 4.07* 3.72*  ALBUMIN 3.9  --   --   --   --  2.3*  --  2.0* 1.9* 2.0* 2.0*  CALCIUM 9.4   < > 7.5* 7.2*  --  7.3*  --  7.5* 7.8* 8.4* 8.4*  PHOS  --   --   --   --   --  4.7*  --  5.1* 4.9* 4.4 4.6  AST 26  --   --   --   --   --   --   --   --   --   --   ALT 19  --   --   --   --   --   --   --   --   --   --    < > = values in this interval not displayed.   Liver Function Tests: Recent Labs  Lab 04/10/22 0405 04/13/22 0456 04/14/22 0218 04/14/22 1530 04/15/22 0351  AST 26  --   --   --   --   ALT 19  --   --   --   --   ALKPHOS 99  --   --   --   --   BILITOT 0.9  --    --   --   --   PROT 9.4*  --   --   --   --   ALBUMIN 3.9   < > 1.9* 2.0* 2.0*   < > = values in this interval not displayed.   Recent Labs  Lab 04/10/22 0405 04/12/22 0448 04/13/22 0456  LIPASE 2,291* 312* 128*   No results for input(s): "AMMONIA" in the last 168 hours. CBC: Recent Labs  Lab 04/11/22 0830 04/11/22 0836 04/12/22 0448 04/12/22 2052 04/13/22 0456 04/14/22 0225 04/15/22 0351  WBC 19.5*  --  13.1*  --  16.2* 11.0* 13.4*  NEUTROABS 17.4*  --  10.5*  --  13.3* 8.8* 11.0*  HGB 14.1   < > 12.7*   < > 11.4* 10.4* 10.4*  HCT 43.5   < > 38.7*   < > 37.1* 32.9* 34.2*  MCV 81.9  --  83.0  --  86.5 85.2 87.5  PLT 275  --  206  --  233 166 204   < > = values in this interval not displayed.   Cardiac Enzymes: No results for input(s): "CKTOTAL", "CKMB", "CKMBINDEX", "TROPONINI" in the last 168 hours. CBG: Recent Labs  Lab 04/14/22 1520 04/14/22 1927 04/14/22 2326 04/15/22 0322 04/15/22 0715  GLUCAP 172* 201* 236* 201* 254*    Iron Studies: No results for input(s): "IRON", "TIBC", "TRANSFERRIN", "FERRITIN" in the last 72 hours. Studies/Results: No results found.  arformoterol  15 mcg Nebulization BID   aspirin EC  81 mg Oral Daily   budesonide (PULMICORT) nebulizer solution  0.5 mg Nebulization BID   Chlorhexidine Gluconate Cloth  6 each Topical Q0600   heparin  5,000 Units Subcutaneous Q8H   insulin aspart  0-15 Units Subcutaneous Q4H   insulin glargine-yfgn  10 Units Subcutaneous BID   mouth rinse  15 mL Mouth Rinse 4 times per day   senna-docusate  1 tablet Oral BID   sodium chloride flush  10-40 mL Intracatheter Q12H    BMET    Component Value Date/Time   NA 135 04/15/2022 0351   NA 134 03/16/2022 1050   K 4.3 04/15/2022 0351   CL 100 04/15/2022 0351   CO2 22 04/15/2022 0351   GLUCOSE 243 (H) 04/15/2022 0351   BUN 36 (H) 04/15/2022 0351   BUN 16 03/16/2022 1050   CREATININE 3.72 (H) 04/15/2022 0351   CALCIUM 8.4 (L) 04/15/2022 0351   GFRNONAA  18 (L) 04/15/2022 0351   GFRAA >60 11/26/2019 1316   CBC    Component Value Date/Time   WBC 13.4 (H) 04/15/2022 0351   RBC 3.91 (L) 04/15/2022 0351   HGB 10.4 (L) 04/15/2022 0351   HGB 14.0 03/16/2022 1050   HCT 34.2 (L) 04/15/2022 0351   HCT 42.4 03/16/2022 1050   PLT 204 04/15/2022 0351   PLT 258 03/16/2022 1050   MCV 87.5 04/15/2022 0351   MCV 80 03/16/2022 1050   MCH 26.6 04/15/2022 0351   MCHC 30.4 04/15/2022 0351   RDW 16.7 (H) 04/15/2022 0351   RDW 16.5 (H) 03/16/2022 1050   LYMPHSABS 0.4 (L) 04/15/2022 0351   LYMPHSABS 1.0 03/16/2022 1050   MONOABS 0.9 04/15/2022 0351   EOSABS 0.7 (H) 04/15/2022 0351   EOSABS 0.4 03/16/2022 1050   BASOSABS 0.0 04/15/2022 0351   BASOSABS 0.0 03/16/2022 1050      Assessment/Plan:  AKI/CKD stage IIIb-IV - presumably ischemic ATN in setting of acute pancreatitis, hypotension, pressor support, with concomitant ARB, SGLT-2 inhibitor and finerenone.  Initially non-oliguric but now UOP has decreased since being placed on pressors.  Started on CRRT 04/13/22.  RIJ temp HD cath placed 04/13/22.  CRRT prescription:  pre and post-filter 2K/2.5Ca at 400 mL/hr, Dialysate 4K/2.5Ca at 1500 mL/hr, heparin anticoagulation, UF goal 50-100 mL/hr.  Will keep even given drop in UOP and low Bp's. Continue to hold losartan, Jardiance, and [place].   UOP improved overnight at 1700, bp dropping, will decrease UF  goal to 0-50 mL/hr.  Keep MAP >65 Renal dose meds for eGFR <15 mL/min.   Avoid nephrotoxic agents such as IV contrast, NSAIDs, and phosphate containing bowel preps (FLEETS).   Preferred narcotics are hydromorphone, fentanyl, and methadone.  Morphine should be avoided. Acute pancreatitis - due to hypertriglyceridemia.  DKA - likely due to SGLT-2 inhibitor.   Shock - likely due to pancreatitis.  Currently on levophed. Acute hypoxic respiratory failure - with underlying COPD and OSA.  Per PCCM HTN - hold bp meds Hep C with advanced liver fibrosis Chronic  diastolic CHF - stable.    Donetta Potts, MD [facility]

## 2022-04-15 NOTE — Inpatient Diabetes Management (Addendum)
Inpatient Diabetes Program Recommendations  AACE/ADA: New Consensus Statement on Inpatient Glycemic Control (2015)  Target Ranges:  Prepandial:   less than 140 mg/dL      Peak postprandial:   less than 180 mg/dL (1-2 hours)      Critically ill patients:  140 - 180 mg/dL    Latest Reference Range & Units 04/13/22 23:50 04/14/22 03:09 04/14/22 07:23 04/14/22 11:58 04/14/22 15:20 04/14/22 19:27  Glucose-Capillary 70 - 99 mg/dL 189 (H)  2 units Novolog  178 (H)  2 units Novolog  178 (H)  2 units Novolog  5 units Semglee '@0820'$  180 (H)  2 units Novolog 172 (H)  2 units Novolog 201 (H)  3 units Novolog  5 units Semglee '@2119'$   (H): Data is abnormally high  Latest Reference Range & Units 04/14/22 23:26 04/15/22 03:22 04/15/22 07:15  Glucose-Capillary 70 - 99 mg/dL 236 (H)  3 units Novolog  201 (H)  3 units Novolog  254 (H)  5 units Novolog   (H): Data is abnormally high   Admit with: DKA/ Acute Pancreatitis/ Resp Distress  History: DM2, CKD, Alcohol Abuse  Home DM Meds: Jardiance 10 mg daily  Current Orders: Semglee 5 units BID     Novolog Sensitive Correction Scale/ SSI (0-9 units) Q4 hours     CRRT--O2 via HFNC   MD- Note CBGs >200 since 8pm last night  Please consider:  1. Increase Semglee to 10 units BID (0.15 units/kg)  2. Increase Novolog SSi to the 0-15 unit Moderate scale Q4 hours    --Will follow patient during hospitalization--  Wyn Quaker RN, MSN, [place] [facility] Coordinator Inpatient Glycemic Control Team Team Pager: [phone] (8a-5p)

## 2022-04-15 NOTE — Progress Notes (Signed)
NAME:  Mathew Lara, MRN:  [id number], DOB:  December 04, 1960, LOS: 5 ADMISSION DATE:  04/10/2022, CONSULTATION DATE:  8/5 REFERRING MD:  Darrell Jewel, CHIEF COMPLAINT: Abdominal pain  History of Present Illness:  Mathew Lara is an 61 y.o. M who presented to the [facility] ED on 8/5 with a chief complaint of abdominal pain.  They have a past medical history of alcohol abuse, anxiety, CKD 3, COPD, OSA, type 2 diabetes, hypertension, hyperlipidemia, hep C  ED work-up revealed DKA, acute pancreatitis, AKI.  He was admitted to the hospital service.  Overnight he developed continuing respiratory distress, shock.   PCCM was consulted for transfer to [facility] for further management.  Still requiring pressors, abdominal pain is improved, respiratory status also appears better  Pertinent  Medical History  Alcohol abuse, anxiety, CKD 3, COPD, OSA, type 2 diabetes, hypertension, hyperlipidemia, hep C  Significant Hospital Events: Including procedures, antibiotic start and stop dates in addition to other pertinent events   8/5 presented to [facility] ED, transferred [facility] 8/7 on pressors  8/8 on BiPAP, worsening renal parameters 8/9 tolerating CRRT, on BiPAP 8/10-has been off BiPAP, on high flow oxygen and tolerating  Interim History / Subjective:   No overnight events States he feels better overall Breathing does appear comfortable, no increased work of breathing at rest  Objective   Blood pressure 107/65, pulse 86, temperature 98.7 F (37.1 C), temperature source Oral, resp. rate 14, height '5\' 8"'$  (1.727 m), weight (!) 136.1 kg, SpO2 97 %.    Vent Mode: BIPAP FiO2 (%):  [50 %-60 %] 50 % Set Rate:  [15 bmp] 15 bmp PEEP:  [5 cmH20] 5 cmH20   Intake/Output Summary (Last 24 hours) at 04/15/2022 0818 Last data filed at 04/15/2022 0800 Gross per 24 hour  Intake 597.63 ml  Output 1768 ml  Net -1170.37 ml   Filed Weights   04/11/22 0245 04/14/22 0500 04/15/22 0500  Weight:  132.8 kg (!) 136.2 kg (!) 136.1 kg    Examination: General: Comfortable on high flow nasal cannula HEENT: Moist oral mucosa Neuro: Awake alert interactive, able to hold conversation CV: S1-S2 appreciated with no murmur PULM: Decreased air entry bilaterally, few rales at the bases GI: Bowel sounds appreciated Extremities: No clubbing Skin:  no rashes or lesions noted  Labs/imaging  Potassium of 4.3, bicarb 22, BUN 36, creatinine 3.72  Lipase now 128 No growth on blood cultures Triglycerides 1864, cholesterol 350  WBC 16.2-improved to 11  Chest x-ray, hypoventilated lung fields  CT abdomen pelvis concerning for acute pancreatitis, no biliary dilation, status postcholecystectomy Right upper quadrant ultrasound with hepatic steatosis Chest x-ray: Opacity of left lung, small right pleural effusion, no pneumothorax, no obvious infiltrate EKG: Sinus tach, no ST changes noted  Sputum sample sent for culture is not acceptable for testing  Resolved Hospital Problem list     Assessment & Plan:   Acute kidney injury on chronic kidney disease Stable on CRRT -Appreciate renal follow-up, discussed at bedside -Urine output did decrease -Continue to maintain renal perfusion -Avoid nephrotoxic medications  Type 2 diabetes DKA -Off Endo tool  Pancreatitis -Lipase normalized -No significant nausea this morning  Multifactorial shock -Continue pain management -Still requiring Levophed  History of interstitial lung disease from prior COVID infection Underlying chronic obstructive pulmonary disease Underlying obstructive sleep apnea -Continue high flow oxygen supplementation -Will have BiPAP as backup if needed  History of diastolic heart failure -Echo 12/25/2019 revealed ejection fraction of 60 to  65%  History of hepatitis C with advanced liver fibrosis -Followed up with Dr. Abbey Chatters with [facility] gastroenterology -Continue close monitoring  Multifactorial reasons for  respiratory failure -Continue oxygen supplementation to maintain saturations greater than 90% -Re-send sputum for Gram stain and cultures  Hopefully able to stay off BiPAP and likely will not require ventilation Discussed with family members at bedside  Best Practice (right click and "Reselect all SmartList Selections" daily)   Diet/type: Regular consistency (see orders) DVT prophylaxis: prophylactic heparin  GI prophylaxis: H2B Lines: N/A Foley:  Yes, and it is still needed Code Status:  full code Last date of multidisciplinary goals of care discussion [pending]  Labs   CBC: Recent Labs  Lab 04/11/22 0830 04/11/22 0836 04/12/22 0448 04/12/22 2052 04/13/22 0456 04/14/22 0225 04/15/22 0351  WBC 19.5*  --  13.1*  --  16.2* 11.0* 13.4*  NEUTROABS 17.4*  --  10.5*  --  13.3* 8.8* 11.0*  HGB 14.1   < > 12.7* 12.2* 11.4* 10.4* 10.4*  HCT 43.5   < > 38.7* 36.0* 37.1* 32.9* 34.2*  MCV 81.9  --  83.0  --  86.5 85.2 87.5  PLT 275  --  206  --  233 166 204   < > = values in this interval not displayed.    Basic Metabolic Panel: Recent Labs  Lab 04/11/22 1430 04/11/22 1800 04/13/22 0456 04/13/22 0905 04/13/22 1623 04/14/22 0218 04/14/22 0225 04/14/22 1530 04/15/22 0351  NA 135   < > 131*  --  135 136  --  138 135  K 4.1   < > 6.0* 4.7 5.0  5.0 4.7  --  4.6 4.3  CL 106   < > 100  --  103 102  --  102 100  CO2 19*   < > 18*  --  21* 22  --  22 22  GLUCOSE 115*   < > 229*  --  266* 195*  --  185* 243*  BUN 43*   < > 57*  --  56* 42*  --  37* 36*  CREATININE 5.21*   < > 7.36*  --  6.66* 5.13*  --  4.07* 3.72*  CALCIUM 7.4*   < > 7.3*  --  7.5* 7.8*  --  8.4* 8.4*  MG 1.8  --  2.1  --   --   --  2.2  --  2.6*  PHOS  --   --  4.7*  --  5.1* 4.9*  --  4.4 4.6   < > = values in this interval not displayed.   GFR: Estimated Creatinine Clearance: 27.8 mL/min (A) (by C-G formula based on SCr of 3.72 mg/dL (H)). Recent Labs  Lab 04/10/22 0703 04/10/22 0906 04/10/22 2300  04/11/22 0345 04/11/22 0400 04/11/22 0830 04/11/22 1430 04/12/22 0448 04/13/22 0456 04/14/22 0225 04/15/22 0351  PROCALCITON 0.54  --   --  10.42  --   --   --  5.37 4.42  --   --   WBC  --   --   --   --   --  19.5*  --  13.1* 16.2* 11.0* 13.4*  LATICACIDVEN  --    < > 2.4*  --  2.4* 2.6* 1.5  --   --   --   --    < > = values in this interval not displayed.    Liver Function Tests: Recent Labs  Lab 04/10/22 0405 04/13/22 0456 04/13/22 1623 04/14/22  6144 04/14/22 1530 04/15/22 0351  AST 26  --   --   --   --   --   ALT 19  --   --   --   --   --   ALKPHOS 99  --   --   --   --   --   BILITOT 0.9  --   --   --   --   --   PROT 9.4*  --   --   --   --   --   ALBUMIN 3.9 2.3* 2.0* 1.9* 2.0* 2.0*   Recent Labs  Lab 04/10/22 0405 04/12/22 0448 04/13/22 0456  LIPASE 2,291* 312* 128*   No results for input(s): "AMMONIA" in the last 168 hours.  ABG    Component Value Date/Time   PHART 7.240 (L) 04/12/2022 2052   PCO2ART 44.2 04/12/2022 2052   PO2ART 110 (H) 04/12/2022 2052   HCO3 19.1 (L) 04/12/2022 2052   TCO2 20 (L) 04/12/2022 2052   ACIDBASEDEF 8.0 (H) 04/12/2022 2052   O2SAT 97 04/12/2022 2052     Coagulation Profile: No results for input(s): "INR", "PROTIME" in the last 168 hours.  Cardiac Enzymes: No results for input(s): "CKTOTAL", "CKMB", "CKMBINDEX", "TROPONINI" in the last 168 hours.  HbA1C: Hgb A1c MFr Bld  Date/Time Value Ref Range Status  04/10/2022 04:05 AM 12.5 (H) 4.8 - 5.6 % Final    Comment:    (NOTE) Pre diabetes:          5.7%-6.4%  Diabetes:              >6.4%  Glycemic control for   <7.0% adults with diabetes     CBG: Recent Labs  Lab 04/14/22 1520 04/14/22 1927 04/14/22 2326 04/15/22 0322 04/15/22 0715  GLUCAP 172* 201* 236* 201* 254*    Review of Systems:   Positives in bold  Gen: fever, chills, weight change, fatigue, night sweats HEENT:  blurred vision, double vision, hearing loss, tinnitus, sinus congestion,  rhinorrhea, sore throat, neck stiffness, dysphagia PULM:  shortness of breath, cough, sputum production, hemoptysis, wheezing CV: chest pain, edema, orthopnea, paroxysmal nocturnal dyspnea, palpitations GI:  abdominal pain, nausea, vomiting, diarrhea, hematochezia, melena, constipation, change in bowel habits GU: dysuria, hematuria, polyuria, oliguria, urethral discharge Endocrine: hot or cold intolerance, polyuria, polyphagia or appetite change Derm: rash, dry skin, scaling or peeling skin change Heme: easy bruising, bleeding, bleeding gums Neuro: headache, numbness, weakness, slurred speech, loss of memory or consciousness   Past Medical History:  He,  has a past medical history of Alcohol abuse, Anxiety, Bronchitis, Chronic hip pain, CKD (chronic kidney disease) stage 3, GFR 30-59 ml/min (HCC), COPD (chronic obstructive pulmonary disease) ([place]), DKA (diabetic ketoacidosis) ([place]) (04/10/2022), Essential hypertension, Hepatitis C, Mixed hyperlipidemia, and Sleep apnea.   Surgical History:   Past Surgical History:  Procedure Laterality Date   BALLOON DILATION N/A 07/06/2021   Procedure: BALLOON DILATION;  Surgeon: [name], DO;  Location: [facility] SUITE;  Service: Endoscopy;  Laterality: N/A;   BIOPSY  07/06/2021   Procedure: BIOPSY;  Surgeon: [name], DO;  Location: [facility];  Service: Endoscopy;;   CATARACT EXTRACTION Right    CHOLECYSTECTOMY N/A 03/23/2021   Procedure: LAPAROSCOPIC CHOLECYSTECTOMY;  Surgeon: [name], MD;  Location: [facility] ORS;  Service: General;  Laterality: N/A;   COLONOSCOPY N/A 01/23/2014   SLF:The LEFT COLON IS EXTREMELY redundant/TWO RECTAL POLYPS REMOVED/SMALL VOLUME RECTAL BLEEDING MOST LIKELY DUE TO Small  internal hemorrhoids. path with prolapse type polyp of benign-mucosa. no adenomas   COLONOSCOPY WITH PROPOFOL N/A 07/06/2021   non-bleeding internal hemorrhoids, one 8 mm polyp in sigmod s/p removal and clip placed. One 5 mm polyp  at recto-sigmoid s/p removal. 3 year surveillance. Tubular adenomas   ESOPHAGOGASTRODUODENOSCOPY N/A 01/23/2014   SAY:[id number] ring at the gastroesophageal junction/Medium sized hiatal hernia/NDYSPEPSIA MOST LIKELY DUE TO GERD/MILD Non-erosive gastritis   ESOPHAGOGASTRODUODENOSCOPY N/A 04/18/2018   food impaction, small hiatal hernia   ESOPHAGOGASTRODUODENOSCOPY (EGD) WITH PROPOFOL N/A 07/06/2021   moderate Schatzki ring s/p dilation, gastritis s/p biopsy, normal duodenum. Negative H.pylori.   HEMORRHOID BANDING  01/23/2014   Procedure: HEMORRHOID BANDING;  Surgeon: Danie Binder, MD;  Location: [facility];  Service: Endoscopy;;   JOINT REPLACEMENT Right 2017   LIVER BIOPSY N/A 03/23/2021   Procedure: LAPAROSCOPIC LIVER BIOPSY;  Surgeon: [name], MD;  Location: [facility] ORS;  Service: General;  Laterality: N/A;   MOUTH SURGERY     POLYPECTOMY  07/06/2021   Procedure: POLYPECTOMY;  Surgeon: [name], DO;  Location: [facility];  Service: Endoscopy;;   TOTAL HIP ARTHROPLASTY Right 06/04/2016   Procedure: RIGHT TOTAL HIP ARTHROPLASTY ANTERIOR APPROACH;  Surgeon: Mcarthur Rossetti, MD;  Location: [facility];  Service: Orthopedics;  Laterality: Right;   TOTAL HIP ARTHROPLASTY Left 03/02/2019   Procedure: LEFT TOTAL HIP ARTHROPLASTY ANTERIOR APPROACH;  Surgeon: Mcarthur Rossetti, MD;  Location: [facility] ORS;  Service: Orthopedics;  Laterality: Left;     Social History:   reports that he quit smoking about 7 years ago. His smoking use included cigarettes. He started smoking about 50 years ago. He has a 42.00 pack-year smoking history. He has been exposed to tobacco smoke. He has never used smokeless tobacco. He reports that he does not currently use alcohol. He reports that he does not use drugs.   Family History:  His family history includes Cancer in his mother. There is no history of Colon cancer or Colon polyps.   Allergies No Known Allergies   Home Medications  Prior  to Admission medications   Medication Sig Start Date End Date Taking? Authorizing Provider  albuterol (PROAIR HFA) 108 (90 Base) MCG/ACT inhaler Inhale 2 puffs into the lungs every 4 (four) hours as needed for wheezing or shortness of breath. 02/15/22   Chesley Mires, MD  amLODipine (NORVASC) 10 MG tablet Take 10 mg by mouth daily. 02/05/19   [provider]  amoxicillin-clavulanate (AUGMENTIN) 875-125 MG tablet Take 1 tablet by mouth 2 (two) times daily. 03/16/22   Chesley Mires, MD  aspirin EC 81 MG tablet Take 81 mg by mouth daily.    [provider]  cholecalciferol (VITAMIN D) 25 MCG (1000 UNIT) tablet Take 1,000 Units by mouth daily.    [provider]  DULoxetine (CYMBALTA) 60 MG capsule Take 60 mg by mouth daily. 06/15/21   [provider]  hydrOXYzine (ATARAX/VISTARIL) 25 MG tablet Take 50 mg by mouth at bedtime.    [provider]  loratadine (CLARITIN) 10 MG tablet Take 1 tablet (10 mg total) by mouth daily. 09/02/19   Barton Dubois, MD  losartan (COZAAR) 50 MG tablet Take 50 mg by mouth daily. 02/11/20   [provider]  oxyCODONE (OXY IR/ROXICODONE) 5 MG immediate release tablet Take 5 mg by mouth every 8 (eight) hours as needed for moderate pain. 02/25/20   [provider]  pantoprazole (PROTONIX) 40 MG tablet Take 1 tablet (40 mg  total) by mouth 2 (two) times daily before a meal. 30 minutes before breakfast 03/08/22 09/04/22  Annitta Needs, NP  sildenafil (VIAGRA) 100 MG tablet Take 100 mg by mouth daily as needed for erectile dysfunction. 04/21/21   [provider]   The patient is critically ill with multiple organ systems failure and requires high complexity decision making for assessment and support, frequent evaluation and titration of therapies, application of advanced monitoring technologies and extensive interpretation of multiple databases. Critical Care Time devoted to patient care services described in this note  independent of APP/resident time (if applicable)  is 33 minutes.   Sherrilyn Rist MD [facility] Personal pager: See Amion If unanswered, please page CCM On-call: [phone]

## 2022-04-15 NOTE — Progress Notes (Signed)
Initial Nutrition Assessment  DOCUMENTATION CODES:   Not applicable  INTERVENTION:   Ensure Enlive po TID, each supplement provides 350 kcal and 20 grams of protein.  B-complex with vitamin C while on CRRT.  Change diet to dysphagia 3 with thin liquids for easier chewing.  NUTRITION DIAGNOSIS:   Increased nutrient needs related to acute illness (renal failure requiring CRRT) as evidenced by estimated needs.  GOAL:   Patient will meet greater than or equal to 90% of their needs  MONITOR:   PO intake, Supplement acceptance, Labs, I & O's  REASON FOR ASSESSMENT:   Rounds (Poor PO on CRRT)    ASSESSMENT:   61 yo male admitted with DKA, acute pancreatitis, respiratory distress, shock. PMH includes alcohol abuse, anxiety, CKD 3, COPD, DM-2, HTN, HLD, hepatitis C.  Discussed patient in ICU rounds and with RN today. HD catheter placed and CRRT was initiated on 8/8. Per RN, patient is eating poorly. He has been on full liquids since 8/7. He did well with full liquids for breakfast, eating grits and drinking 2 juices. For lunch (diet was advanced to heart healthy), he only ate a few bites. He has partials, but not with him. He is having some trouble chewing foods. During RD visit, patient took a bite of his fresh fruit cup and got a little choked. RN aware. Will downgrade diet textures for easier chewing. Discussed with patient the importance of adequate protein and calorie intake. Patient agreed to drink Ensure supplements between meals. He likes the chocolate flavor best.   Labs reviewed.  CBG: [phone]  Medications reviewed and include Novolog, Semglee, Senokot-S, Levophed.  Net IO Since Admission: 5,041.73 mL [04/15/22 1551]  Intake/Output Summary (Last 24 hours) at 04/15/2022 1551 Last data filed at 04/15/2022 1500 Gross per 24 hour  Intake 703.13 ml  Output 1594 ml  Net -890.87 ml   UOP 360 ml x 24 hours.  Weight history reviewed. No significant weight changes noted.    NUTRITION - FOCUSED PHYSICAL EXAM:  Flowsheet Row Most Recent Value  Orbital Region No depletion  Upper Arm Region No depletion  Thoracic and Lumbar Region No depletion  Buccal Region No depletion  Temple Region No depletion  Clavicle Bone Region No depletion  Clavicle and Acromion Bone Region No depletion  Scapular Bone Region No depletion  Dorsal Hand No depletion  Patellar Region Mild depletion  Anterior Thigh Region Mild depletion  Posterior Calf Region Mild depletion  Edema (RD Assessment) Mild  Hair Reviewed  Eyes Reviewed  Mouth Reviewed  Skin Reviewed  Nails Reviewed       Diet Order:   Diet Order             Diet heart healthy/carb modified Room service appropriate? Yes; Fluid consistency: Thin  Diet effective now                   EDUCATION NEEDS:   Not appropriate for education at this time  Skin:  Skin Assessment: Reviewed RN Assessment  Last BM:  8/9  Height:   Ht Readings from Last 1 Encounters:  04/10/22 '5\' 8"'$  (1.727 m)    Weight:   Wt Readings from Last 1 Encounters:  04/15/22 (!) 136.1 kg    Ideal Body Weight:  70 kg  BMI:  Body mass index is 45.62 kg/m.  Estimated Nutritional Needs:   Kcal:  2600-2800  Protein:  165-175 gm  Fluid:  1 L + UOP    Lucas Mallow. RD,  LDN, CNSC Please refer to Amion for contact information.

## 2022-04-16 ENCOUNTER — Inpatient Hospital Stay (HOSPITAL_COMMUNITY): Payer: Medicare Other

## 2022-04-16 DIAGNOSIS — E1110 Type 2 diabetes mellitus with ketoacidosis without coma: Secondary | ICD-10-CM | POA: Diagnosis not present

## 2022-04-16 LAB — CBC WITH DIFFERENTIAL/PLATELET
Abs Immature Granulocytes: 0 10*3/uL (ref 0.00–0.07)
Basophils Absolute: 0 10*3/uL (ref 0.0–0.1)
Basophils Relative: 0 %
Eosinophils Absolute: 0.6 10*3/uL — ABNORMAL HIGH (ref 0.0–0.5)
Eosinophils Relative: 4 %
HCT: 32.5 % — ABNORMAL LOW (ref 39.0–52.0)
Hemoglobin: 10 g/dL — ABNORMAL LOW (ref 13.0–17.0)
Lymphocytes Relative: 4 %
Lymphs Abs: 0.6 10*3/uL — ABNORMAL LOW (ref 0.7–4.0)
MCH: 26.7 pg (ref 26.0–34.0)
MCHC: 30.8 g/dL (ref 30.0–36.0)
MCV: 86.7 fL (ref 80.0–100.0)
Monocytes Absolute: 1.5 10*3/uL — ABNORMAL HIGH (ref 0.1–1.0)
Monocytes Relative: 10 %
Neutro Abs: 12.1 10*3/uL — ABNORMAL HIGH (ref 1.7–7.7)
Neutrophils Relative %: 82 %
Platelets: 200 10*3/uL (ref 150–400)
RBC: 3.75 MIL/uL — ABNORMAL LOW (ref 4.22–5.81)
RDW: 16.5 % — ABNORMAL HIGH (ref 11.5–15.5)
WBC: 14.7 10*3/uL — ABNORMAL HIGH (ref 4.0–10.5)
nRBC: 0.1 % (ref 0.0–0.2)
nRBC: 2 /100 WBC — ABNORMAL HIGH

## 2022-04-16 LAB — RENAL FUNCTION PANEL
Albumin: 1.9 g/dL — ABNORMAL LOW (ref 3.5–5.0)
Albumin: 2 g/dL — ABNORMAL LOW (ref 3.5–5.0)
Anion gap: 11 (ref 5–15)
Anion gap: 11 (ref 5–15)
BUN: 27 mg/dL — ABNORMAL HIGH (ref 8–23)
BUN: 23 mg/dL (ref 8–23)
CO2: 23 mmol/L (ref 22–32)
CO2: 24 mmol/L (ref 22–32)
Calcium: 8.4 mg/dL — ABNORMAL LOW (ref 8.9–10.3)
Calcium: 8.8 mg/dL — ABNORMAL LOW (ref 8.9–10.3)
Chloride: 102 mmol/L (ref 98–111)
Chloride: 104 mmol/L (ref 98–111)
Creatinine, Ser: 2.71 mg/dL — ABNORMAL HIGH (ref 0.61–1.24)
Creatinine, Ser: 2.3 mg/dL — ABNORMAL HIGH (ref 0.61–1.24)
GFR, Estimated: 26 mL/min — ABNORMAL LOW (ref >60)
GFR, Estimated: 31 mL/min — ABNORMAL LOW (ref >60)
Glucose, Bld: 190 mg/dL — ABNORMAL HIGH (ref 70–99)
Glucose, Bld: 225 mg/dL — ABNORMAL HIGH (ref 70–99)
Phosphorus: 2.9 mg/dL (ref 2.5–4.6)
Phosphorus: 2.9 mg/dL (ref 2.5–4.6)
Potassium: 3.6 mmol/L (ref 3.5–5.1)
Potassium: 3.9 mmol/L (ref 3.5–5.1)
Sodium: 136 mmol/L (ref 135–145)
Sodium: 139 mmol/L (ref 135–145)

## 2022-04-16 LAB — GLUCOSE, CAPILLARY
Glucose-Capillary: 182 mg/dL — ABNORMAL HIGH (ref 70–99)
Glucose-Capillary: 182 mg/dL — ABNORMAL HIGH (ref 70–99)
Glucose-Capillary: 204 mg/dL — ABNORMAL HIGH (ref 70–99)
Glucose-Capillary: 220 mg/dL — ABNORMAL HIGH (ref 70–99)
Glucose-Capillary: 251 mg/dL — ABNORMAL HIGH (ref 70–99)
Glucose-Capillary: 257 mg/dL — ABNORMAL HIGH (ref 70–99)

## 2022-04-16 LAB — MAGNESIUM: Magnesium: 2.5 mg/dL — ABNORMAL HIGH (ref 1.7–2.4)

## 2022-04-16 MED ORDER — INSULIN STARTER KIT- PEN NEEDLES (ENGLISH)
1.0000 | Freq: Once | Status: AC
Start: 2022-04-16 — End: 2022-04-16
  Administered 2022-04-16: 1
  Filled 2022-04-16: qty 1

## 2022-04-16 MED ORDER — PROSOURCE TF20 ENFIT COMPATIBL EN LIQD
60.0000 mL | Freq: Three times a day (TID) | ENTERAL | Status: DC
  Administered 2022-04-16 – 2022-04-21 (×11): 60 mL
  Filled 2022-04-16 (×12): qty 60

## 2022-04-16 MED ORDER — LIVING WELL WITH DIABETES BOOK
Freq: Once | Status: AC
Start: 2022-04-16 — End: 2022-04-16
  Filled 2022-04-16: qty 1

## 2022-04-16 MED ORDER — INSULIN GLARGINE-YFGN 100 UNIT/ML ~~LOC~~ SOLN
15.0000 [IU] | Freq: Two times a day (BID) | SUBCUTANEOUS | Status: DC
  Administered 2022-04-16 – 2022-04-18 (×4): 15 [IU] via SUBCUTANEOUS
  Filled 2022-04-16 (×5): qty 0.15

## 2022-04-16 MED ORDER — OSMOLITE 1.5 CAL PO LIQD
1000.0000 mL | ORAL | Status: DC
  Administered 2022-04-16 – 2022-04-20 (×3): 1000 mL
  Filled 2022-04-16: qty 1000

## 2022-04-16 MED ORDER — ORAL CARE MOUTH RINSE: 15.0000 mL | OROMUCOSAL | Status: DC | PRN

## 2022-04-16 NOTE — Progress Notes (Signed)
NAME:  Mathew Lara, MRN:  [id number], DOB:  August 01, 1961, LOS: 6 ADMISSION DATE:  04/10/2022, CONSULTATION DATE:  8/5 REFERRING MD:  Darrell Jewel, CHIEF COMPLAINT: Abdominal pain  History of Present Illness:  Mathew Lara is an 62 y.o. M who presented to the [facility] on 8/5 with a chief complaint of abdominal pain.  They have a past medical history of alcohol abuse, anxiety, CKD 3, COPD, OSA, type 2 diabetes, hypertension, hyperlipidemia, hep C  ED work-up revealed DKA, acute pancreatitis, AKI.  He was admitted to the hospital service.  Overnight he developed continuing respiratory distress, shock.   PCCM was consulted for transfer to [facility] for further management.  Still requiring pressors, abdominal pain is improved, respiratory status also appears better  Pertinent  Medical History  Alcohol abuse, anxiety, CKD 3, COPD, OSA, type 2 diabetes, hypertension, hyperlipidemia, hep C  Significant Hospital Events: Including procedures, antibiotic start and stop dates in addition to other pertinent events   8/5 presented to [facility] ED, transferred [facility] 8/7 on pressors  8/8 on BiPAP, worsening renal parameters 8/9 tolerating CRRT, on BiPAP 8/10-has been off BiPAP, on high flow oxygen and tolerating  Interim History / Subjective:   Unable to tolerate BiPAP due to N/V Tolerating HHFNC  Some confusion but able to answer questions and follow commands Poor appetite due to nausea  Objective   Blood pressure 128/86, pulse (!) 114, temperature 98.3 F (36.8 C), temperature source Oral, resp. rate 20, height '5\' 8"'$  (1.727 m), weight 133.5 kg, SpO2 95 %.    FiO2 (%):  [50 %-55 %] 55 %   Intake/Output Summary (Last 24 hours) at 04/16/2022 1320 Last data filed at 04/16/2022 1300 Gross per 24 hour  Intake 403.94 ml  Output 1048 ml  Net -644.06 ml   Filed Weights   04/14/22 0500 04/15/22 0500 04/16/22 0500  Weight: (!) 136.2 kg (!) 136.1 kg 133.5 kg    Physical  Exam: General: Well-appearing, no acute distress HENT: [place], AT, OP clear, MMM Eyes: EOMI, no scleral icterus Respiratory: Diminished but clear to auscultation bilaterally.  No crackles, wheezing or rales Cardiovascular: RRR, -M/R/G, no JVD GI: BS+, soft, nontender, distended Extremities:-Edema,-tenderness Neuro: AAO x2, CNII-XII grossly intact   Labs/imaging  K 3.6 BUN/Cr improving 27/2.71  WBC 14 >stable  Lipase now 128  CT abdomen pelvis concerning for acute pancreatitis, no biliary dilation, status postcholecystectomy Right upper quadrant ultrasound with hepatic steatosis Chest x-ray: Opacity of left lung, small right pleural effusion, no pneumothorax, no obvious infiltrate  Sputum sample sent for culture is not acceptable for testing  Resolved Hospital Problem list     Assessment & Plan:   Acute metabolic encephalopathy -Re-orient, supportive management -Management as noted below  History of interstitial lung disease from prior COVID infection Underlying chronic obstructive pulmonary disease Underlying obstructive sleep apnea -Continue high flow oxygen supplementation -Will have BiPAP as backup if needed -Brovana, Pulmicort and PRN albuterol  Acute kidney injury on chronic kidney disease 2/2 ATN in setting of acute pancreatitis and shock Good UOP -Appreciate renal follow-up. Decreased UF goal with improved UOP -Continue to maintain renal perfusion -Avoid nephrotoxic medications  Type 2 diabetes DKA - resolved -Semglee 10U BID  Acute pancreatitis -Lipase normalized -Trend triglycerides  Multifactorial shock -Off pressors -Continue midodrine -Continue pain management  History of diastolic heart failure -Echo 12/25/2019 revealed ejection fraction of 60 to 65%  History of hepatitis C with advanced liver fibrosis -Followed up with Dr.  Carver with [facility] gastroenterology -Continue close monitoring   Best Practice (right click and "Reselect all  SmartList Selections" daily)   Diet/type: clear liquids due to nausea DVT prophylaxis: prophylactic heparin  GI prophylaxis: H2B Lines: N/A Foley:  Yes, and it is still needed Code Status:  full code Last date of multidisciplinary goals of care discussion [pending]   The patient is critically ill with multiple organ systems failure and requires high complexity decision making for assessment and support, frequent evaluation and titration of therapies, application of advanced monitoring technologies and extensive interpretation of multiple databases.  Independent Critical Care Time: 65 Minutes.   Rodman Pickle, M.D. [facility] Pulmonary/Critical Care Medicine 04/16/2022 1:20 PM   Please see Amion for pager number to reach on-call Pulmonary and Critical Care Team.

## 2022-04-16 NOTE — Procedures (Signed)
Cortrak  Person Inserting Tube:  Mathew Lara, RD Tube Type:  Cortrak - 55 inches Tube Size:  10 Tube Location:  Left nare Secured by: Bridle Technique Used to Measure Tube Placement:  Marking at nare/corner of mouth Cortrak Secured At:  118 cm   Cortrak Tube Team Note:  Consult received to place a Cortrak feeding tube.   X-ray is required, abdominal x-ray has been ordered by the Cortrak team. Please confirm tube placement before using the Cortrak tube.   If the tube becomes dislodged please keep the tube and contact the Cortrak team at [email or web address] ([email or web address]) for replacement.  If after hours and replacement cannot be delayed, place a NG tube and confirm placement with an abdominal x-ray.    Lockie Pares., RD, LDN, CNSC See AMiON for contact information

## 2022-04-16 NOTE — Inpatient Diabetes Management (Addendum)
Inpatient Diabetes Program Recommendations  AACE/ADA: New Consensus Statement on Inpatient Glycemic Control  Target Ranges:  Prepandial:   less than 140 mg/dL      Peak postprandial:   less than 180 mg/dL (1-2 hours)      Critically ill patients:  140 - 180 mg/dL     Latest Reference Range & Units 04/15/22 07:15 04/15/22 11:39 04/15/22 14:55 04/15/22 19:35 04/15/22 23:56 04/16/22 03:37 04/16/22 07:56  Glucose-Capillary 70 - 99 mg/dL 254 (H) 285 (H) 274 (H) 235 (H) 206 (H) 182 (H) 182 (H)    Latest Reference Range & Units 04/10/22 04:05  Hemoglobin A1C 4.8 - 5.6 % 12.5 (H)   Review of Glycemic Control  Diabetes history: DM2 Outpatient Diabetes medications: Jardiance 10 mg daily Current orders for Inpatient glycemic control: Semglee 10 units BID, Novolog 0-15 units Q4H  Inpatient Diabetes Program Recommendations:    Insulin: Please consider increasing Semglee to 12 units BID.  Addendum 04/16/22_0 :05-Spoke to patient at bedside regarding DM. Patient did not provide much information verbally, he would nod his head and answer "yes" or "no" verbally to questions. Patient confirms that he was taking Jardiance outpatient for DM. Patient does not know how long it has been since he see his PCP (Dr. Hilma Favors). Patient does not know what an A1C is, does not check his glucose at home currently, and not sure about glucose goals.   Patient reports that he lives with his sister. Explained what an A1C is and discussed current A1C of 12.5% on 04/10/22 indicating an average glucose of 312 mg/dl over the past 2-3 months. Explained that he may need to take insulin outpatient for DM control and patient stated that he would be agreeable to take insulin if prescribed.Informed patient that I would order education material on DM and insulin. Encouraged patient to start looking over educational material once received. Informed patient that diabetes team will continue to follow along and will plan to speak with him again  on Monday or Tuesday. Patient reports he has no questions at this time. Ordered Living Well with DM book, insulin pen starter kit, and patient education by bedside nursing.   Thanks, Barnie Alderman, RN, MSN, [place] Diabetes Coordinator Inpatient Diabetes Program [phone] (Team Pager from 8am to [place])

## 2022-04-16 NOTE — Progress Notes (Addendum)
Nutrition Follow-up  DOCUMENTATION CODES:   Not applicable  INTERVENTION:   Initiate tube feeding via Cortrak: Osmolite 1.5 at 20 ml/h trickle (TF for now). If patient is tolerating tube feeding tomorrow, recommend increase to goal rate of 70 ml/h (1680 ml per day) Prosource TF20 60 ml TID At goal rate, TF regimen provides 2760 kcal, 165 gm protein, 1280 ml free water daily.  D/C Ensure Enlive while on clear liquids.   B-complex with vitamin C while on CRRT.  NUTRITION DIAGNOSIS:   Increased nutrient needs related to acute illness (renal failure requiring CRRT) as evidenced by estimated needs.  GOAL:   Patient will meet greater than or equal to 90% of their needs  MONITOR:   PO intake, Supplement acceptance, Labs, I & O's  REASON FOR ASSESSMENT:   Rounds (Poor PO on CRRT)    ASSESSMENT:   61 yo male admitted with DKA, acute pancreatitis, respiratory distress, shock. PMH includes alcohol abuse, anxiety, CKD 3, COPD, DM-2, HTN, HLD, hepatitis C.  Discussed patient in ICU rounds and with RN today. Patient has been less alert today than yesterday. Oral intake is minimal per RN and he gets nauseous every time he eats or drinks anything. Plans to change diet to clear liquids today. Patient has had minimal nutrition since admission and has increased nutrition requirements d/t CRRT. Okay to place a postpyloric Cortrak tube and begin trickle tube feedings. Cortrak was placed and tip is past the LOT per x-ray.  Labs reviewed.  CBG: [phone]  Medications reviewed and include B-complex with vitamin C, Novolog, Semglee, Senokot-S.  Net IO Since Admission: 4,276.29 mL [04/16/22 1541]  Intake/Output Summary (Last 24 hours) at 04/16/2022 1541 Last data filed at 04/16/2022 1500 Gross per 24 hour  Intake 394.56 ml  Output 1060 ml  Net -665.44 ml   UOP 935 ml x 24 hours.  Diet Order:   Diet Order             Diet clear liquid Room service appropriate? Yes; Fluid consistency:  Thin  Diet effective now                   EDUCATION NEEDS:   Not appropriate for education at this time  Skin:  Skin Assessment: Reviewed RN Assessment  Last BM:  8/9  Height:   Ht Readings from Last 1 Encounters:  04/10/22 '5\' 8"'$  (1.727 m)    Weight:   Wt Readings from Last 1 Encounters:  04/16/22 133.5 kg    Ideal Body Weight:  70 kg  BMI:  Body mass index is 44.75 kg/m.  Estimated Nutritional Needs:   Kcal:  2600-2800  Protein:  165-175 gm  Fluid:  1 L + UOP    Lucas Mallow RD, LDN, CNSC Please refer to Amion for contact information.

## 2022-04-16 NOTE — Progress Notes (Signed)
Patient ID: Mathew Lara, male   DOB: May 02, 1961, 61 y.o.   MRN: [id number] S: Not feeling well this morning. O:BP (!) 141/89   Pulse (!) 109   Temp 98.3 F (36.8 C) (Oral)   Resp 17   Ht '5\' 8"'  (1.727 m)   Wt 133.5 kg   SpO2 95%   BMI 44.75 kg/m   Intake/Output Summary (Last 24 hours) at 04/16/2022 1024 Last data filed at 04/16/2022 1000 Gross per 24 hour  Intake 349.75 ml  Output 1043 ml  Net -693.25 ml   Intake/Output: I/O last 3 completed shifts: In: 839.7 [P.O.:576; I.V.:223.7; Other:40] Out: 2262 [Urine:1265]  Intake/Output this shift:  Total I/O In: 128 [P.O.:118; I.V.:10] Out: 130 [Urine:130] Weight change: -2.6 kg Gen: NAD CVS: Tachy Resp: CTA Abd: distended, hypoactive bowel sounds. Ext: no edema  Recent Labs  Lab 04/10/22 0405 04/10/22 0906 04/13/22 0456 04/13/22 0905 04/13/22 1623 04/14/22 0218 04/14/22 1530 04/15/22 0351 04/15/22 1640 04/16/22 0520  NA 125*   < > 131*  --  135 136 138 135 137 136  K 5.0   < > 6.0* 4.7 5.0  5.0 4.7 4.6 4.3 4.0 3.6  CL 89*   < > 100  --  103 102 102 100 102 102  CO2 16*   < > 18*  --  21* '22 22 22 24 23  ' GLUCOSE 774*   < > 229*  --  266* 195* 185* 243* 256* 190*  BUN 44*   < > 57*  --  56* 42* 37* 36* 33* 27*  CREATININE 3.33*   < > 7.36*  --  6.66* 5.13* 4.07* 3.72* 3.38* 2.71*  ALBUMIN 3.9  --  2.3*  --  2.0* 1.9* 2.0* 2.0* 1.9* 1.9*  CALCIUM 9.4   < > 7.3*  --  7.5* 7.8* 8.4* 8.4* 8.5* 8.4*  PHOS  --   --  4.7*  --  5.1* 4.9* 4.4 4.6 3.5 2.9  AST 26  --   --   --   --   --   --   --   --   --   ALT 19  --   --   --   --   --   --   --   --   --    < > = values in this interval not displayed.   Liver Function Tests: Recent Labs  Lab 04/10/22 0405 04/13/22 0456 04/15/22 0351 04/15/22 1640 04/16/22 0520  AST 26  --   --   --   --   ALT 19  --   --   --   --   ALKPHOS 99  --   --   --   --   BILITOT 0.9  --   --   --   --   PROT 9.4*  --   --   --   --   ALBUMIN 3.9   < > 2.0* 1.9* 1.9*   < > =  values in this interval not displayed.   Recent Labs  Lab 04/10/22 0405 04/12/22 0448 04/13/22 0456  LIPASE 2,291* 312* 128*   No results for input(s): "AMMONIA" in the last 168 hours. CBC: Recent Labs  Lab 04/12/22 0448 04/12/22 2052 04/13/22 0456 04/14/22 0225 04/15/22 0351 04/16/22 0520  WBC 13.1*  --  16.2* 11.0* 13.4* 14.7*  NEUTROABS 10.5*  --  13.3* 8.8* 11.0* 12.1*  HGB 12.7*   < > 11.4* 10.4*  10.4* 10.0*  HCT 38.7*   < > 37.1* 32.9* 34.2* 32.5*  MCV 83.0  --  86.5 85.2 87.5 86.7  PLT 206  --  233 166 204 200   < > = values in this interval not displayed.   Cardiac Enzymes: No results for input(s): "CKTOTAL", "CKMB", "CKMBINDEX", "TROPONINI" in the last 168 hours. CBG: Recent Labs  Lab 04/15/22 1455 04/15/22 1935 04/15/22 2356 04/16/22 0337 04/16/22 0756  GLUCAP 274* 235* 206* 182* 182*    Iron Studies: No results for input(s): "IRON", "TIBC", "TRANSFERRIN", "FERRITIN" in the last 72 hours. Studies/Results: DG Chest 1 View  Result Date: 04/16/2022 CLINICAL DATA:  Encounter for respiratory failure EXAM: CHEST  1 VIEW COMPARISON:  Three days ago FINDINGS: Central line on the right with tip at the right atrium. Low volume chest with streaky and indistinct opacity about the hila and bases. Biapical bullous changes. No visible effusion or pneumothorax. Cardiomegaly. IMPRESSION: Stable low volume chest and pulmonary opacities. Electronically Signed   By: Jorje Guild M.D.   On: 04/16/2022 05:13    arformoterol  15 mcg Nebulization BID   aspirin EC  81 mg Oral Daily   B-complex with vitamin C  1 tablet Oral Daily   budesonide (PULMICORT) nebulizer solution  0.5 mg Nebulization BID   Chlorhexidine Gluconate Cloth  6 each Topical Daily   feeding supplement  237 mL Oral TID BM   heparin  5,000 Units Subcutaneous Q8H   insulin aspart  0-15 Units Subcutaneous Q4H   insulin glargine-yfgn  10 Units Subcutaneous BID   midodrine  10 mg Oral TID AC   senna-docusate  1  tablet Oral BID   sodium chloride flush  10-40 mL Intracatheter Q12H    BMET    Component Value Date/Time   NA 136 04/16/2022 0520   NA 134 03/16/2022 1050   K 3.6 04/16/2022 0520   CL 102 04/16/2022 0520   CO2 23 04/16/2022 0520   GLUCOSE 190 (H) 04/16/2022 0520   BUN 27 (H) 04/16/2022 0520   BUN 16 03/16/2022 1050   CREATININE 2.71 (H) 04/16/2022 0520   CALCIUM 8.4 (L) 04/16/2022 0520   GFRNONAA 26 (L) 04/16/2022 0520   GFRAA >60 11/26/2019 1316   CBC    Component Value Date/Time   WBC 14.7 (H) 04/16/2022 0520   RBC 3.75 (L) 04/16/2022 0520   HGB 10.0 (L) 04/16/2022 0520   HGB 14.0 03/16/2022 1050   HCT 32.5 (L) 04/16/2022 0520   HCT 42.4 03/16/2022 1050   PLT 200 04/16/2022 0520   PLT 258 03/16/2022 1050   MCV 86.7 04/16/2022 0520   MCV 80 03/16/2022 1050   MCH 26.7 04/16/2022 0520   MCHC 30.8 04/16/2022 0520   RDW 16.5 (H) 04/16/2022 0520   RDW 16.5 (H) 03/16/2022 1050   LYMPHSABS 0.6 (L) 04/16/2022 0520   LYMPHSABS 1.0 03/16/2022 1050   MONOABS 1.5 (H) 04/16/2022 0520   EOSABS 0.6 (H) 04/16/2022 0520   EOSABS 0.4 03/16/2022 1050   BASOSABS 0.0 04/16/2022 0520   BASOSABS 0.0 03/16/2022 1050      Assessment/Plan:  AKI/CKD stage IIIb-IV - presumably ischemic ATN in setting of acute pancreatitis, hypotension, pressor support, with concomitant ARB, SGLT-2 inhibitor and finerenone.  Initially non-oliguric but now UOP has decreased since being placed on pressors.  Started on CRRT 04/13/22.  RIJ temp HD cath placed 04/13/22.  CRRT prescription:  pre and post-filter 2K/2.5Ca at 400 mL/hr, Dialysate 4K/2.5Ca at 1500 mL/hr, heparin anticoagulation,  UF goal 50-100 mL/hr.  Will keep even given drop in UOP and low Bp's.  UOP up to 935 mL overnight.  Continue to hold losartan, Jardiance, and [place].   UOP improved overnight at 1700, bp dropping, will decrease UF goal to 0-50 mL/hr.  Keep MAP >65 Renal dose meds for eGFR <15 mL/min.   Avoid nephrotoxic agents such as IV  contrast, NSAIDs, and phosphate containing bowel preps (FLEETS).   Preferred narcotics are hydromorphone, fentanyl, and methadone.  Morphine should be avoided. Acute pancreatitis - due to hypertriglyceridemia.  DKA - likely due to SGLT-2 inhibitor.   Shock - likely due to pancreatitis.  Currently on levophed. Acute hypoxic respiratory failure - with underlying COPD and OSA.  Per PCCM HTN - hold bp meds Hep C with advanced liver fibrosis Chronic diastolic CHF - stable.  Donetta Potts, MD [facility]

## 2022-04-17 ENCOUNTER — Inpatient Hospital Stay (HOSPITAL_COMMUNITY): Payer: Medicare Other

## 2022-04-17 DIAGNOSIS — J9601 Acute respiratory failure with hypoxia: Secondary | ICD-10-CM | POA: Diagnosis not present

## 2022-04-17 DIAGNOSIS — N179 Acute kidney failure, unspecified: Secondary | ICD-10-CM | POA: Diagnosis not present

## 2022-04-17 LAB — RENAL FUNCTION PANEL
Albumin: 2 g/dL — ABNORMAL LOW (ref 3.5–5.0)
Albumin: 2 g/dL — ABNORMAL LOW (ref 3.5–5.0)
Anion gap: 13 (ref 5–15)
Anion gap: 11 (ref 5–15)
BUN: 26 mg/dL — ABNORMAL HIGH (ref 8–23)
BUN: 27 mg/dL — ABNORMAL HIGH (ref 8–23)
CO2: 23 mmol/L (ref 22–32)
CO2: 25 mmol/L (ref 22–32)
Calcium: 9.1 mg/dL (ref 8.9–10.3)
Calcium: 9.4 mg/dL (ref 8.9–10.3)
Chloride: 102 mmol/L (ref 98–111)
Chloride: 103 mmol/L (ref 98–111)
Creatinine, Ser: 2.13 mg/dL — ABNORMAL HIGH (ref 0.61–1.24)
Creatinine, Ser: 1.83 mg/dL — ABNORMAL HIGH (ref 0.61–1.24)
GFR, Estimated: 34 mL/min — ABNORMAL LOW (ref >60)
GFR, Estimated: 41 mL/min — ABNORMAL LOW (ref >60)
Glucose, Bld: 253 mg/dL — ABNORMAL HIGH (ref 70–99)
Glucose, Bld: 255 mg/dL — ABNORMAL HIGH (ref 70–99)
Phosphorus: 2.7 mg/dL (ref 2.5–4.6)
Phosphorus: 2.6 mg/dL (ref 2.5–4.6)
Potassium: 3.7 mmol/L (ref 3.5–5.1)
Potassium: 4 mmol/L (ref 3.5–5.1)
Sodium: 138 mmol/L (ref 135–145)
Sodium: 139 mmol/L (ref 135–145)

## 2022-04-17 LAB — HEPATIC FUNCTION PANEL
ALT: 17 U/L (ref 0–44)
AST: 30 U/L (ref 15–41)
Albumin: 2 g/dL — ABNORMAL LOW (ref 3.5–5.0)
Alkaline Phosphatase: 98 U/L (ref 38–126)
Bilirubin, Direct: 0.3 mg/dL — ABNORMAL HIGH (ref 0.0–0.2)
Indirect Bilirubin: 0.5 mg/dL (ref 0.3–0.9)
Total Bilirubin: 0.8 mg/dL (ref 0.3–1.2)
Total Protein: 7.5 g/dL (ref 6.5–8.1)

## 2022-04-17 LAB — CBC
HCT: 35.5 % — ABNORMAL LOW (ref 39.0–52.0)
Hemoglobin: 10.9 g/dL — ABNORMAL LOW (ref 13.0–17.0)
MCH: 26.8 pg (ref 26.0–34.0)
MCHC: 30.7 g/dL (ref 30.0–36.0)
MCV: 87.2 fL (ref 80.0–100.0)
Platelets: 217 10*3/uL (ref 150–400)
RBC: 4.07 MIL/uL — ABNORMAL LOW (ref 4.22–5.81)
RDW: 16.5 % — ABNORMAL HIGH (ref 11.5–15.5)
WBC: 18.4 10*3/uL — ABNORMAL HIGH (ref 4.0–10.5)
nRBC: 0.2 % (ref 0.0–0.2)

## 2022-04-17 LAB — GLUCOSE, CAPILLARY
Glucose-Capillary: 215 mg/dL — ABNORMAL HIGH (ref 70–99)
Glucose-Capillary: 225 mg/dL — ABNORMAL HIGH (ref 70–99)
Glucose-Capillary: 229 mg/dL — ABNORMAL HIGH (ref 70–99)
Glucose-Capillary: 256 mg/dL — ABNORMAL HIGH (ref 70–99)
Glucose-Capillary: 256 mg/dL — ABNORMAL HIGH (ref 70–99)
Glucose-Capillary: 279 mg/dL — ABNORMAL HIGH (ref 70–99)

## 2022-04-17 LAB — AMMONIA: Ammonia: 24 umol/L (ref 9–35)

## 2022-04-17 LAB — MAGNESIUM: Magnesium: 2.4 mg/dL (ref 1.7–2.4)

## 2022-04-17 MED ORDER — INSULIN ASPART 100 UNIT/ML IJ SOLN
3.0000 [IU] | INTRAMUSCULAR | Status: DC
  Administered 2022-04-17 – 2022-04-18 (×7): 3 [IU] via SUBCUTANEOUS

## 2022-04-17 MED ORDER — ORAL CARE MOUTH RINSE
15.0000 mL | OROMUCOSAL | Status: DC
  Administered 2022-04-17 – 2022-05-13 (×81): 15 mL via OROMUCOSAL

## 2022-04-17 MED ORDER — ORAL CARE MOUTH RINSE: 15.0000 mL | OROMUCOSAL | Status: DC | PRN

## 2022-04-17 MED ORDER — INSULIN ASPART 100 UNIT/ML IJ SOLN
0.0000 [IU] | INTRAMUSCULAR | Status: DC
  Administered 2022-04-17 (×3): 11 [IU] via SUBCUTANEOUS
  Administered 2022-04-17: 7 [IU] via SUBCUTANEOUS
  Administered 2022-04-18: 4 [IU] via SUBCUTANEOUS
  Administered 2022-04-18: 7 [IU] via SUBCUTANEOUS
  Administered 2022-04-18: 4 [IU] via SUBCUTANEOUS
  Administered 2022-04-18: 7 [IU] via SUBCUTANEOUS
  Administered 2022-04-18: 11 [IU] via SUBCUTANEOUS
  Administered 2022-04-18 – 2022-04-19 (×2): 4 [IU] via SUBCUTANEOUS
  Administered 2022-04-19: 7 [IU] via SUBCUTANEOUS
  Administered 2022-04-19 (×2): 4 [IU] via SUBCUTANEOUS
  Administered 2022-04-19: 3 [IU] via SUBCUTANEOUS
  Administered 2022-04-19: 11 [IU] via SUBCUTANEOUS
  Administered 2022-04-19: 4 [IU] via SUBCUTANEOUS
  Administered 2022-04-20: 7 [IU] via SUBCUTANEOUS
  Administered 2022-04-20 (×4): 4 [IU] via SUBCUTANEOUS
  Administered 2022-04-20: 7 [IU] via SUBCUTANEOUS
  Administered 2022-04-21 (×2): 4 [IU] via SUBCUTANEOUS
  Administered 2022-04-21: 7 [IU] via SUBCUTANEOUS
  Administered 2022-04-21: 4 [IU] via SUBCUTANEOUS
  Administered 2022-04-21: 7 [IU] via SUBCUTANEOUS
  Administered 2022-04-21: 11 [IU] via SUBCUTANEOUS
  Administered 2022-04-22 (×3): 7 [IU] via SUBCUTANEOUS
  Administered 2022-04-22: 4 [IU] via SUBCUTANEOUS
  Administered 2022-04-22: 7 [IU] via SUBCUTANEOUS
  Administered 2022-04-23: 4 [IU] via SUBCUTANEOUS
  Administered 2022-04-23: 7 [IU] via SUBCUTANEOUS
  Administered 2022-04-23 – 2022-04-24 (×7): 4 [IU] via SUBCUTANEOUS
  Administered 2022-04-24: 7 [IU] via SUBCUTANEOUS
  Administered 2022-04-24 (×3): 4 [IU] via SUBCUTANEOUS
  Administered 2022-04-25: 7 [IU] via SUBCUTANEOUS
  Administered 2022-04-25: 4 [IU] via SUBCUTANEOUS
  Administered 2022-04-25: 11 [IU] via SUBCUTANEOUS
  Administered 2022-04-25: 7 [IU] via SUBCUTANEOUS
  Administered 2022-04-25: 11 [IU] via SUBCUTANEOUS
  Administered 2022-04-25: 7 [IU] via SUBCUTANEOUS
  Administered 2022-04-26: 4 [IU] via SUBCUTANEOUS
  Administered 2022-04-26: 7 [IU] via SUBCUTANEOUS
  Administered 2022-04-26 (×2): 4 [IU] via SUBCUTANEOUS
  Administered 2022-04-26: 7 [IU] via SUBCUTANEOUS
  Administered 2022-04-26 – 2022-04-27 (×2): 4 [IU] via SUBCUTANEOUS
  Administered 2022-04-27: 7 [IU] via SUBCUTANEOUS
  Administered 2022-04-27 (×2): 11 [IU] via SUBCUTANEOUS
  Administered 2022-04-27: 4 [IU] via SUBCUTANEOUS
  Administered 2022-04-27: 11 [IU] via SUBCUTANEOUS
  Administered 2022-04-28: 3 [IU] via SUBCUTANEOUS
  Administered 2022-04-28: 20 [IU] via SUBCUTANEOUS
  Administered 2022-04-28: 11 [IU] via SUBCUTANEOUS
  Administered 2022-04-28 (×3): 15 [IU] via SUBCUTANEOUS
  Administered 2022-04-29: 20 [IU] via SUBCUTANEOUS
  Administered 2022-04-29: 15 [IU] via SUBCUTANEOUS

## 2022-04-17 NOTE — Progress Notes (Signed)
Patient ID: Mathew Lara, male   DOB: [birth year], 61 y.o.   MRN: [id number] S:Feeling a little better today. O:BP 123/86 (BP Location: Right Arm)   Pulse (!) 121   Temp 98 F (36.7 C) (Oral)   Resp (!) 29   Ht _0  (1.727 m)   Wt 133.8 kg   SpO2 93%   BMI 44.85 kg/m   Intake/Output Summary (Last 24 hours) at 04/17/2022 0846 Last data filed at 04/17/2022 0800 Gross per 24 hour  Intake 692.67 ml  Output 1396 ml  Net -703.33 ml   Intake/Output: I/O last 3 completed shifts: In: 923 [P.O.:286; I.V.:102.3; NG/GT:534.7] Out: 1945 [Urine:1650]  Intake/Output this shift:  Total I/O In: 20 [NG/GT:20] Out: 20  Weight change: 0.3 kg Gen:NAD CVS: tachy Resp: occ rhonchi Abd: distended, hypoactive BS, mildly tender to palpation Ext: trace edema  Recent Labs  Lab 04/14/22 0218 04/14/22 1530 04/15/22 0351 04/15/22 1640 04/16/22 0520 04/16/22 1639 04/17/22 0053  NA 136 138 135 137 136 139 138  K 4.7 4.6 4.3 4.0 3.6 3.9 3.7  CL 102 102 100 102 102 104 102  CO2 _1 GLUCOSE 195* 185* 243* 256* 190* 225* 253*  BUN 42* 37* 36* 33* 27* 23 26*  CREATININE 5.13* 4.07* 3.72* 3.38* 2.71* 2.30* 2.13*  ALBUMIN 1.9* 2.0* 2.0* 1.9* 1.9* 2.0* 2.0*  CALCIUM 7.8* 8.4* 8.4* 8.5* 8.4* 8.8* 9.1  PHOS 4.9* 4.4 4.6 3.5 2.9 2.9 2.7   Liver Function Tests: Recent Labs  Lab 04/16/22 0520 04/16/22 1639 04/17/22 0053  ALBUMIN 1.9* 2.0* 2.0*   Recent Labs  Lab 04/12/22 0448 04/13/22 0456  LIPASE 312* 128*   No results for input(s): "AMMONIA" in the last 168 hours. CBC: Recent Labs  Lab 04/13/22 0456 04/14/22 0225 04/15/22 0351 04/16/22 0520 04/17/22 0053  WBC 16.2* 11.0* 13.4* 14.7* 18.4*  NEUTROABS 13.3* 8.8* 11.0* 12.1*  --   HGB 11.4* 10.4* 10.4* 10.0* 10.9*  HCT 37.1* 32.9* 34.2* 32.5* 35.5*  MCV 86.5 85.2 87.5 86.7 87.2  PLT 233 166 204 200 217   Cardiac Enzymes: No results for input(s): "CKTOTAL", "CKMB", "CKMBINDEX", "TROPONINI" in the last 168  hours. CBG: Recent Labs  Lab 04/16/22 1555 04/16/22 1930 04/16/22 2314 04/17/22 0327 04/17/22 0738  GLUCAP 220* 251* 257* 225* 256*    Iron Studies: No results for input(s): "IRON", "TIBC", "TRANSFERRIN", "FERRITIN" in the last 72 hours. Studies/Results: DG Abd Portable 1V  Result Date: 04/16/2022 CLINICAL DATA:  NG tube placement. EXAM: PORTABLE ABDOMEN - 1 VIEW COMPARISON:  04/11/2022.  CT, 04/10/2022. FINDINGS: Enteric feeding tube has been placed, extending through the stomach, tip projecting just distal to the ligament of Treitz. IMPRESSION: 1. Well-positioned enteric feeding tube. Electronically Signed   By: Lajean Manes M.D.   On: 04/16/2022 15:42   DG Chest 1 View  Result Date: 04/16/2022 CLINICAL DATA:  Encounter for respiratory failure EXAM: CHEST  1 VIEW COMPARISON:  Three days ago FINDINGS: Central line on the right with tip at the right atrium. Low volume chest with streaky and indistinct opacity about the hila and bases. Biapical bullous changes. No visible effusion or pneumothorax. Cardiomegaly. IMPRESSION: Stable low volume chest and pulmonary opacities. Electronically Signed   By: Jorje Guild M.D.   On: 04/16/2022 05:13    arformoterol  15 mcg Nebulization BID   aspirin EC  81 mg Oral Daily   B-complex with vitamin C  1 tablet Oral Daily  budesonide (PULMICORT) nebulizer solution  0.5 mg Nebulization BID   Chlorhexidine Gluconate Cloth  6 each Topical Daily   feeding supplement  237 mL Oral TID BM   feeding supplement (PROSource TF20)  60 mL Per Tube TID   heparin  5,000 Units Subcutaneous Q8H   insulin aspart  0-20 Units Subcutaneous Q4H   insulin glargine-yfgn  15 Units Subcutaneous BID   midodrine  10 mg Oral TID AC   mouth rinse  15 mL Mouth Rinse 4 times per day   senna-docusate  1 tablet Oral BID   sodium chloride flush  10-40 mL Intracatheter Q12H    BMET    Component Value Date/Time   NA 138 04/17/2022 0053   NA 134 03/16/2022 1050   K 3.7  04/17/2022 0053   CL 102 04/17/2022 0053   CO2 23 04/17/2022 0053   GLUCOSE 253 (H) 04/17/2022 0053   BUN 26 (H) 04/17/2022 0053   BUN 16 03/16/2022 1050   CREATININE 2.13 (H) 04/17/2022 0053   CALCIUM 9.1 04/17/2022 0053   GFRNONAA 34 (L) 04/17/2022 0053   GFRAA >60 11/26/2019 1316   CBC    Component Value Date/Time   WBC 18.4 (H) 04/17/2022 0053   RBC 4.07 (L) 04/17/2022 0053   HGB 10.9 (L) 04/17/2022 0053   HGB 14.0 03/16/2022 1050   HCT 35.5 (L) 04/17/2022 0053   HCT 42.4 03/16/2022 1050   PLT 217 04/17/2022 0053   PLT 258 03/16/2022 1050   MCV 87.2 04/17/2022 0053   MCV 80 03/16/2022 1050   MCH 26.8 04/17/2022 0053   MCHC 30.7 04/17/2022 0053   RDW 16.5 (H) 04/17/2022 0053   RDW 16.5 (H) 03/16/2022 1050   LYMPHSABS 0.6 (L) 04/16/2022 0520   LYMPHSABS 1.0 03/16/2022 1050   MONOABS 1.5 (H) 04/16/2022 0520   EOSABS 0.6 (H) 04/16/2022 0520   EOSABS 0.4 03/16/2022 1050   BASOSABS 0.0 04/16/2022 0520   BASOSABS 0.0 03/16/2022 1050    Assessment/Plan:  AKI/CKD stage IIIb-IV - presumably ischemic ATN in setting of acute pancreatitis, hypotension, pressor support, with concomitant ARB, SGLT-2 inhibitor and finerenone.  Initially non-oliguric but now UOP has decreased since being placed on pressors.  Started on CRRT 04/13/22.  RIJ temp HD cath placed 04/13/22.  CRRT prescription:  pre and post-filter 2K/2.5Ca at 400 mL/hr, Dialysate 4K/2.5Ca at 1500 mL/hr, heparin anticoagulation, UF goal 50-100 mL/hr.  Will keep even given drop in UOP and low Bp's.  UOP up to 935 mL 8/10, 1125 mL overnight.  Continue to keep even and may be able to hold CRRT soon and follow for renal recovery.  Continue to hold losartan, Jardiance, and [place].   UOP improved overnight at 1700, bp dropping, will decrease UF goal to 0-50 mL/hr.  Keep MAP >65 Renal dose meds for eGFR <15 mL/min.   Avoid nephrotoxic agents such as IV contrast, NSAIDs, and phosphate containing bowel preps (FLEETS).   Preferred  narcotics are hydromorphone, fentanyl, and methadone.  Morphine should be avoided. Acute pancreatitis - due to hypertriglyceridemia.  DKA - likely due to SGLT-2 inhibitor.   Shock - likely due to pancreatitis.  Currently on levophed. Acute hypoxic respiratory failure - with underlying COPD and OSA.  Per PCCM HTN - hold bp meds Hep C with advanced liver fibrosis Chronic diastolic CHF - stable.  Donetta Potts, MD [facility]

## 2022-04-17 NOTE — Progress Notes (Signed)
Patient unable to vocalize name on command. RT and RN decided to place patient back on BiPAP at this time. Will continue to monitor patient as needed

## 2022-04-17 NOTE — Inpatient Diabetes Management (Signed)
Inpatient Diabetes Program Recommendations  AACE/ADA: New Consensus Statement on Inpatient Glycemic Control   Target Ranges:  Prepandial:   less than 140 mg/dL      Peak postprandial:   less than 180 mg/dL (1-2 hours)      Critically ill patients:  140 - 180 mg/dL    Latest Reference Range & Units 04/17/22 03:27 04/17/22 07:38  Glucose-Capillary 70 - 99 mg/dL 225 (H) 256 (H)    Latest Reference Range & Units 04/16/22 07:56 04/16/22 11:42 04/16/22 15:55 04/16/22 19:30 04/16/22 23:14  Glucose-Capillary 70 - 99 mg/dL 182 (H) 204 (H) 220 (H) 251 (H) 257 (H)   Review of Glycemic Control  Diabetes history: DM2 Outpatient Diabetes medications: Jardiance 10 mg daily Current orders for Inpatient glycemic control: Semglee 15 units BID, Novolog 0-20 units Q4H; Osmolite @ 20 ml/hr   Inpatient Diabetes Program Recommendations:     Insulin: Please consider increasing Semglee to 18 units BID and Novolog 4 units Q4H for tube feeding coverage. If tube feeding is stopped or held then Novolog tube feeding coverage should also be stopped or held.  NOTE: Noted Osmolite 1.5 tube feedings started yesterday at 20 ml/hr with plan to increase to goal of 70 ml/hr if patient tolerating tube feeding today.  Would recommend increasing Semglee to 18 units BID and ordering Novolog 4 units Q4H for tube feeding coverage. Anticipate Novolog tube feeding coverage will need to be increased if tube feeding rate is able to be increased to goal of 70 ml/hr. Will follow along.   Thanks, Mathew Alderman, RN, MSN, [place] Diabetes Coordinator Inpatient Diabetes Program [phone] (Team Pager from 8am to [place])

## 2022-04-17 NOTE — Progress Notes (Signed)
NAME:  ONIE HAYASHI, MRN:  [id number], DOB:  1961/05/18, LOS: 7 ADMISSION DATE:  04/10/2022, CONSULTATION DATE:  8/5 REFERRING MD:  Darrell Jewel, CHIEF COMPLAINT: Abdominal pain  History of Present Illness:  VERNER MCCRONE is an 61 y.o. M who presented to [facility] on 8/5 with a chief complaint of abdominal pain.  They have a past medical history of alcohol abuse, anxiety, CKD 3, COPD, OSA, type 2 diabetes, hypertension, hyperlipidemia, hep C  ED work-up revealed DKA, acute pancreatitis, AKI.  He was admitted to the hospital service.  Overnight he developed continuing respiratory distress, shock.   PCCM was consulted for transfer to [facility] for further management.  Still requiring pressors, abdominal pain is improved, respiratory status also appears better  Pertinent  Medical History  Alcohol abuse, anxiety, CKD 3, COPD, OSA, type 2 diabetes, hypertension, hyperlipidemia, hep C  Significant Hospital Events: Including procedures, antibiotic start and stop dates in addition to other pertinent events   8/5 presented to [facility] ED, transferred [facility] 8/7 on pressors  8/8 on BiPAP, worsening renal parameters 8/9 tolerating CRRT, on BiPAP 8/10-has been off BiPAP, on high flow oxygen and tolerating 8/11-UOP improving. CRRT with net even UF  Interim History / Subjective:   Started on trickle feeds Had BM overnight. Still has abdominal discomfort. Continues to have poor appetite On HHFN 50% Good UOP with CRRT on net even UF  Objective   Blood pressure 123/86, pulse (!) 122, temperature 98 F (36.7 C), temperature source Oral, resp. rate 19, height '5\' 8"'$  (1.727 m), weight 133.8 kg, SpO2 94 %.    FiO2 (%):  [50 %-55 %] 50 %   Intake/Output Summary (Last 24 hours) at 04/17/2022 0819 Last data filed at 04/17/2022 0800 Gross per 24 hour  Intake 692.67 ml  Output 1396 ml  Net -703.33 ml   Filed Weights   04/15/22 0500 04/16/22 0500 04/17/22 0500  Weight: (!)  136.1 kg 133.5 kg 133.8 kg   Physical Exam: General: Chronically-appearing, no acute distress HENT: [place], AT, OP clear, MMM, HHFNC in place Eyes: EOMI, no scleral icterus Respiratory: Diminished but clear to auscultation bilaterally.  No crackles, wheezing or rales Cardiovascular: RRR, -M/R/G, no JVD GI: BS+, firm, distended, nontender Extremities:-Edema,-tenderness Neuro: AAO x2, CNII-XII grossly intact  Labs/imaging  Cr improving WBC increasing 18  CT abdomen pelvis concerning for acute pancreatitis, no biliary dilation, status postcholecystectomy Right upper quadrant ultrasound with hepatic steatosis Chest x-ray: Opacity of left lung, small right pleural effusion, no pneumothorax, no obvious infiltrate  Sputum sample sent for culture is not acceptable for testing  Resolved Hospital Problem list     Assessment & Plan:   Acute metabolic encephalopathy -Re-orient, supportive management -Management as noted below  History of interstitial lung disease from prior COVID infection Underlying chronic obstructive pulmonary disease Underlying obstructive sleep apnea -Wean high flow oxygen supplementation -Will have BiPAP as backup if needed. Unable to tolerate due to nausea -Brovana, Pulmicort and PRN albuterol  Acute kidney injury on chronic kidney disease 2/2 ATN in setting of acute pancreatitis and shock Good UOP -Appreciate renal follow-up. Decreased UF goal with improved UOP -Continue to maintain renal perfusion -Avoid nephrotoxic medications  Leukocytosis, ?aspiration pneumonitis Afebrile. HDS. Bcx 8/10 NGTD. CXR 8/11 no acute findings -Repeat KUB -Monitor fever/WBC -Consider empiric antibiotics if clinically declines  Type 2 diabetes DKA - resolved -Semglee 15U BID  Acute pancreatitis -Lipase normalized -Trend triglycerides  Multifactorial shock -Off pressors 8/11 -Continue  midodrine -Continue pain management  History of diastolic heart failure -Echo  12/25/2019 revealed ejection fraction of 60 to 65%  History of hepatitis C with advanced liver fibrosis -Followed up with Dr. Abbey Chatters with [facility] gastroenterology -Continue close monitoring   Best Practice (right click and "Reselect all SmartList Selections" daily)   Diet/type: clear liquids due to nausea DVT prophylaxis: prophylactic heparin  GI prophylaxis: H2B Lines: N/A Foley:  Yes, and it is still needed Code Status:  full code Last date of multidisciplinary goals of care discussion [pending]  The patient is critically ill with multiple organ systems failure and requires high complexity decision making for assessment and support, frequent evaluation and titration of therapies, application of advanced monitoring technologies and extensive interpretation of multiple databases.  Independent Critical Care Time: 54 Minutes.   Rodman Pickle, M.D. [facility] Pulmonary/Critical Care Medicine 04/17/2022 8:19 AM   Please see Amion for pager number to reach on-call Pulmonary and Critical Care Team.

## 2022-04-17 NOTE — Progress Notes (Addendum)
Patient now alert, able to voice name when asked. Dr. Loanne Drilling at bedside. Plan per Dr. Loanne Drilling to give patient a break from BiPAP later today and encourage patient to wear BiPAP overnight as tolerated.

## 2022-04-18 DIAGNOSIS — E1110 Type 2 diabetes mellitus with ketoacidosis without coma: Secondary | ICD-10-CM | POA: Diagnosis not present

## 2022-04-18 LAB — RENAL FUNCTION PANEL
Albumin: 1.9 g/dL — ABNORMAL LOW (ref 3.5–5.0)
Albumin: 2 g/dL — ABNORMAL LOW (ref 3.5–5.0)
Anion gap: 12 (ref 5–15)
Anion gap: 12 (ref 5–15)
BUN: 30 mg/dL — ABNORMAL HIGH (ref 8–23)
BUN: 32 mg/dL — ABNORMAL HIGH (ref 8–23)
CO2: 23 mmol/L (ref 22–32)
CO2: 25 mmol/L (ref 22–32)
Calcium: 9 mg/dL (ref 8.9–10.3)
Calcium: 9.4 mg/dL (ref 8.9–10.3)
Chloride: 105 mmol/L (ref 98–111)
Chloride: 101 mmol/L (ref 98–111)
Creatinine, Ser: 1.92 mg/dL — ABNORMAL HIGH (ref 0.61–1.24)
Creatinine, Ser: 2.08 mg/dL — ABNORMAL HIGH (ref 0.61–1.24)
GFR, Estimated: 39 mL/min — ABNORMAL LOW (ref >60)
GFR, Estimated: 35 mL/min — ABNORMAL LOW (ref >60)
Glucose, Bld: 212 mg/dL — ABNORMAL HIGH (ref 70–99)
Glucose, Bld: 217 mg/dL — ABNORMAL HIGH (ref 70–99)
Phosphorus: 2.5 mg/dL (ref 2.5–4.6)
Phosphorus: 2.3 mg/dL — ABNORMAL LOW (ref 2.5–4.6)
Potassium: 3.7 mmol/L (ref 3.5–5.1)
Potassium: 3.8 mmol/L (ref 3.5–5.1)
Sodium: 140 mmol/L (ref 135–145)
Sodium: 138 mmol/L (ref 135–145)

## 2022-04-18 LAB — CBC
HCT: 34.1 % — ABNORMAL LOW (ref 39.0–52.0)
Hemoglobin: 10.3 g/dL — ABNORMAL LOW (ref 13.0–17.0)
MCH: 26.4 pg (ref 26.0–34.0)
MCHC: 30.2 g/dL (ref 30.0–36.0)
MCV: 87.4 fL (ref 80.0–100.0)
Platelets: 218 10*3/uL (ref 150–400)
RBC: 3.9 MIL/uL — ABNORMAL LOW (ref 4.22–5.81)
RDW: 16.5 % — ABNORMAL HIGH (ref 11.5–15.5)
WBC: 19.8 10*3/uL — ABNORMAL HIGH (ref 4.0–10.5)
nRBC: 0.2 % (ref 0.0–0.2)

## 2022-04-18 LAB — POCT I-STAT 7, (LYTES, BLD GAS, ICA,H+H)
Acid-Base Excess: 2 mmol/L (ref 0.0–2.0)
Bicarbonate: 27.1 mmol/L (ref 20.0–28.0)
Calcium, Ion: 1.28 mmol/L (ref 1.15–1.40)
HCT: 34 % — ABNORMAL LOW (ref 39.0–52.0)
Hemoglobin: 11.6 g/dL — ABNORMAL LOW (ref 13.0–17.0)
O2 Saturation: 95 %
Patient temperature: 99.7
Potassium: 3.9 mmol/L (ref 3.5–5.1)
Sodium: 138 mmol/L (ref 135–145)
TCO2: 28 mmol/L (ref 22–32)
pCO2 arterial: 44.8 mmHg (ref 32–48)
pH, Arterial: 7.392 (ref 7.35–7.45)
pO2, Arterial: 78 mmHg — ABNORMAL LOW (ref 83–108)

## 2022-04-18 LAB — GLUCOSE, CAPILLARY
Glucose-Capillary: 270 mg/dL — ABNORMAL HIGH (ref 70–99)
Glucose-Capillary: 157 mg/dL — ABNORMAL HIGH (ref 70–99)
Glucose-Capillary: 248 mg/dL — ABNORMAL HIGH (ref 70–99)
Glucose-Capillary: 196 mg/dL — ABNORMAL HIGH (ref 70–99)
Glucose-Capillary: 188 mg/dL — ABNORMAL HIGH (ref 70–99)
Glucose-Capillary: 159 mg/dL — ABNORMAL HIGH (ref 70–99)

## 2022-04-18 LAB — MAGNESIUM: Magnesium: 2.3 mg/dL (ref 1.7–2.4)

## 2022-04-18 LAB — TRIGLYCERIDES: Triglycerides: 321 mg/dL — ABNORMAL HIGH (ref <150)

## 2022-04-18 MED ORDER — SORBITOL 70 % SOLN
960.0000 mL | TOPICAL_OIL | Freq: Once | ORAL | Status: AC
  Administered 2022-04-18: 960 mL via RECTAL
  Filled 2022-04-18: qty 473

## 2022-04-18 MED ORDER — METOCLOPRAMIDE HCL 5 MG/ML IJ SOLN
10.0000 mg | Freq: Four times a day (QID) | INTRAMUSCULAR | Status: DC
  Administered 2022-04-18 – 2022-04-21 (×12): 10 mg via INTRAVENOUS
  Filled 2022-04-18 (×12): qty 2

## 2022-04-18 MED ORDER — SIMETHICONE 80 MG PO CHEW
80.0000 mg | CHEWABLE_TABLET | Freq: Four times a day (QID) | ORAL | Status: DC
  Filled 2022-04-18: qty 1

## 2022-04-18 MED ORDER — HYOSCYAMINE SULFATE 0.125 MG/ML PO SOLN
0.3750 mg | Freq: Once | ORAL | Status: DC | PRN
  Filled 2022-04-18: qty 3

## 2022-04-18 MED ORDER — INSULIN GLARGINE-YFGN 100 UNIT/ML ~~LOC~~ SOLN
18.0000 [IU] | Freq: Two times a day (BID) | SUBCUTANEOUS | Status: DC
  Administered 2022-04-18 – 2022-04-20 (×5): 18 [IU] via SUBCUTANEOUS
  Filled 2022-04-18 (×7): qty 0.18

## 2022-04-18 MED ORDER — ACETAMINOPHEN 325 MG PO TABS
650.0000 mg | ORAL_TABLET | Freq: Once | ORAL | Status: AC
  Administered 2022-04-19: 650 mg via ORAL
  Filled 2022-04-18: qty 2

## 2022-04-18 MED ORDER — SIMETHICONE 80 MG PO CHEW
80.0000 mg | CHEWABLE_TABLET | Freq: Four times a day (QID) | ORAL | Status: DC
  Administered 2022-04-18 – 2022-05-06 (×69): 80 mg
  Filled 2022-04-18 (×66): qty 1

## 2022-04-18 MED ORDER — HYOSCYAMINE SULFATE ER 0.375 MG PO TB12
0.3750 mg | ORAL_TABLET | Freq: Once | ORAL | Status: DC | PRN
Start: 2022-04-18 — End: 2022-04-18
  Filled 2022-04-18: qty 1

## 2022-04-18 MED ORDER — OXYCODONE HCL 5 MG PO TABS
10.0000 mg | ORAL_TABLET | Freq: Four times a day (QID) | ORAL | Status: DC | PRN
  Administered 2022-04-18 (×2): 10 mg via ORAL
  Filled 2022-04-18 (×3): qty 2

## 2022-04-18 MED ORDER — INSULIN ASPART 100 UNIT/ML IJ SOLN
4.0000 [IU] | INTRAMUSCULAR | Status: DC
  Administered 2022-04-18 – 2022-04-21 (×6): 4 [IU] via SUBCUTANEOUS

## 2022-04-18 NOTE — Progress Notes (Signed)
Patient ID: Mathew Lara, male   DOB: 10/04/1960, 61 y.o.   MRN: [id number] S: Feeling a little better today but remains distended. O:BP 111/81   Pulse (!) 126   Temp 98.4 F (36.9 C) (Axillary)   Resp (!) 35   Ht '5\' 8"'  (1.727 m)   Wt 131.8 kg   SpO2 95%   BMI 44.18 kg/m   Intake/Output Summary (Last 24 hours) at 04/18/2022 1027 Last data filed at 04/18/2022 0900 Gross per 24 hour  Intake 1118 ml  Output 1252 ml  Net -134 ml   Intake/Output: I/O last 3 completed shifts: In: 0350 [P.O.:118; I.V.:30; NG/GT:1385] Out: 2564 [Urine:1650]  Intake/Output this shift:  Total I/O In: 190 [P.O.:100; NG/GT:90] Out: 49  Weight change: -2 kg Gen:NAD CVS: tachy at 126 Resp:occ wheezes bilaterally [id number], hypoactive bowel sounds Ext: no edema   Recent Labs  Lab 04/15/22 0351 04/15/22 1640 04/16/22 0520 04/16/22 1639 04/17/22 0053 04/17/22 1650 04/18/22 0702  NA 135 137 136 139 138 139 140  K 4.3 4.0 3.6 3.9 3.7 4.0 3.7  CL 100 102 102 104 102 103 105  CO2 '22 24 23 24 23 25 23  ' GLUCOSE 243* 256* 190* 225* 253* 255* 212*  BUN 36* 33* 27* 23 26* 27* 30*  CREATININE 3.72* 3.38* 2.71* 2.30* 2.13* 1.83* 1.92*  ALBUMIN 2.0* 1.9* 1.9* 2.0* 2.0*  2.0* 2.0* 1.9*  CALCIUM 8.4* 8.5* 8.4* 8.8* 9.1 9.4 9.0  PHOS 4.6 3.5 2.9 2.9 2.7 2.6 2.5  AST  --   --   --   --  30  --   --   ALT  --   --   --   --  17  --   --    Liver Function Tests: Recent Labs  Lab 04/17/22 0053 04/17/22 1650 04/18/22 0702  AST 30  --   --   ALT 17  --   --   ALKPHOS 98  --   --   BILITOT 0.8  --   --   PROT 7.5  --   --   ALBUMIN 2.0*  2.0* 2.0* 1.9*   Recent Labs  Lab 04/12/22 0448 04/13/22 0456  LIPASE 312* 128*   Recent Labs  Lab 04/17/22 1231  AMMONIA 24   CBC: Recent Labs  Lab 04/14/22 0225 04/15/22 0351 04/16/22 0520 04/17/22 0053 04/18/22 0702  WBC 11.0* 13.4* 14.7* 18.4* 19.8*  NEUTROABS 8.8* 11.0* 12.1*  --   --   HGB 10.4* 10.4* 10.0* 10.9* 10.3*  HCT 32.9* 34.2*  32.5* 35.5* 34.1*  MCV 85.2 87.5 86.7 87.2 87.4  PLT 166 204 200 217 218   Cardiac Enzymes: No results for input(s): "CKTOTAL", "CKMB", "CKMBINDEX", "TROPONINI" in the last 168 hours. CBG: Recent Labs  Lab 04/17/22 1520 04/17/22 1938 04/17/22 2333 04/18/22 0308 04/18/22 0805  GLUCAP 215* 279* 229* 270* 157*    Iron Studies: No results for input(s): "IRON", "TIBC", "TRANSFERRIN", "FERRITIN" in the last 72 hours. Studies/Results: DG Abd Portable 1V  Result Date: 04/17/2022 CLINICAL DATA:  Abdominal distension EXAM: PORTABLE ABDOMEN - 1 VIEW COMPARISON:  April 16, 2022 FINDINGS: The feeding tube terminates in the distal third portion of the duodenum, stable. The bowel gas pattern is nonobstructive. No other acute abnormalities. IMPRESSION: The feeding tube is in stable position terminating in the distal third portion of the duodenum. No cause for distention identified. Bowel gas pattern is nonobstructive. Electronically Signed   By: Dorise Bullion III M.D.  On: 04/17/2022 10:04   DG Abd Portable 1V  Result Date: 04/16/2022 CLINICAL DATA:  NG tube placement. EXAM: PORTABLE ABDOMEN - 1 VIEW COMPARISON:  04/11/2022.  CT, 04/10/2022. FINDINGS: Enteric feeding tube has been placed, extending through the stomach, tip projecting just distal to the ligament of Treitz. IMPRESSION: 1. Well-positioned enteric feeding tube. Electronically Signed   By: Lajean Manes M.D.   On: 04/16/2022 15:42    arformoterol  15 mcg Nebulization BID   aspirin EC  81 mg Oral Daily   B-complex with vitamin C  1 tablet Oral Daily   budesonide (PULMICORT) nebulizer solution  0.5 mg Nebulization BID   Chlorhexidine Gluconate Cloth  6 each Topical Daily   feeding supplement  237 mL Oral TID BM   feeding supplement (PROSource TF20)  60 mL Per Tube TID   heparin  5,000 Units Subcutaneous Q8H   insulin aspart  0-20 Units Subcutaneous Q4H   insulin aspart  3 Units Subcutaneous Q4H   insulin glargine-yfgn  15 Units  Subcutaneous BID   midodrine  10 mg Oral TID AC   mouth rinse  15 mL Mouth Rinse 4 times per day   senna-docusate  1 tablet Oral BID   sodium chloride flush  10-40 mL Intracatheter Q12H   sorbitol, milk of mag, mineral oil, glycerin (SMOG) enema  960 mL Rectal Once    BMET    Component Value Date/Time   NA 140 04/18/2022 0702   NA 134 03/16/2022 1050   K 3.7 04/18/2022 0702   CL 105 04/18/2022 0702   CO2 23 04/18/2022 0702   GLUCOSE 212 (H) 04/18/2022 0702   BUN 30 (H) 04/18/2022 0702   BUN 16 03/16/2022 1050   CREATININE 1.92 (H) 04/18/2022 0702   CALCIUM 9.0 04/18/2022 0702   GFRNONAA 39 (L) 04/18/2022 0702   GFRAA >60 11/26/2019 1316   CBC    Component Value Date/Time   WBC 19.8 (H) 04/18/2022 0702   RBC 3.90 (L) 04/18/2022 0702   HGB 10.3 (L) 04/18/2022 0702   HGB 14.0 03/16/2022 1050   HCT 34.1 (L) 04/18/2022 0702   HCT 42.4 03/16/2022 1050   PLT 218 04/18/2022 0702   PLT 258 03/16/2022 1050   MCV 87.4 04/18/2022 0702   MCV 80 03/16/2022 1050   MCH 26.4 04/18/2022 0702   MCHC 30.2 04/18/2022 0702   RDW 16.5 (H) 04/18/2022 0702   RDW 16.5 (H) 03/16/2022 1050   LYMPHSABS 0.6 (L) 04/16/2022 0520   LYMPHSABS 1.0 03/16/2022 1050   MONOABS 1.5 (H) 04/16/2022 0520   EOSABS 0.6 (H) 04/16/2022 0520   EOSABS 0.4 03/16/2022 1050   BASOSABS 0.0 04/16/2022 0520   BASOSABS 0.0 03/16/2022 1050   HPI:  Mathew Lara is an 61 y.o. male with a PMH significant for HTN, morbid obesity, COPD, fatty liver, DM type 2, HLD, HepC, and CKD stage IIIb-IV who presented to [facility] ED on 04/10/22 with a 3 day history of diffuse abdominal pain and bloating.  He also reported anorexia and constipation.  In the ED, VSS, labs notable for WBC 14, Na 125, Co2 16, BUN 44, Cr 3.33, glucose 776, lactate 5.1. lipase 2291, triglycerides 1864.  CT scan of abd/pelvis consistent with acute pancreatitis.  He was admitted for further workup and treatment.  He developed increased work of breathing, diaphoresis,  and restlessness.  He was placed on BiPap and was transferred to the ICU at [facility].  We were consulted due to worsening AKI/CKD stage  III-IV.  Started on CRRT 04/13/22.  UOP has increased.  Assessment/Plan:  AKI/CKD stage IIIb-IV - presumably ischemic ATN in setting of acute pancreatitis, hypotension, pressor support, with concomitant ARB, SGLT-2 inhibitor and finerenone.  Initially non-oliguric but now UOP has decreased since being placed on pressors.  Started on CRRT 04/13/22.  RIJ temp HD cath placed 04/13/22.  CRRT prescription:  pre and post-filter 2K/2.5Ca at 400 mL/hr, Dialysate 4K/2.5Ca at 1500 mL/hr, heparin anticoagulation, UF goal 50-100 mL/hr.  Will keep even given drop in UOP and low Bp's.  UOP up to 935 mL 8/10, 1125 mL on 8/11, and 900 mL on 04/17/22.  Will change UF to 50 mL/hr given worsening respiratory status and follow.    Continue to hold losartan, Jardiance, and [place].   UOP improved overnight at 1700, bp dropping, will decrease UF goal to 0-50 mL/hr.  Keep MAP >65 Renal dose meds for eGFR <15 mL/min.   Avoid nephrotoxic agents such as IV contrast, NSAIDs, and phosphate containing bowel preps (FLEETS).   Preferred narcotics are hydromorphone, fentanyl, and methadone.  Morphine should be avoided. Acute pancreatitis - due to hypertriglyceridemia.  DKA - likely due to SGLT-2 inhibitor.   Shock - likely due to pancreatitis.  Currently on levophed. Acute hypoxic respiratory failure - with underlying COPD and OSA.  Per PCCM HTN - hold bp meds Hep C with advanced liver fibrosis Chronic diastolic CHF - stable.  Donetta Potts, MD [facility]

## 2022-04-18 NOTE — Progress Notes (Signed)
[place] Progress Note Patient Name: Mathew Lara DOB: 1960-12-28 MRN: [id number]   Date of Service  04/18/2022  HPI/Events of Note  Patient having severe hiccups Seen on NIV  eICU Interventions  Ordered metoclopromide IV prn     Intervention Category Intermediate Interventions: Other:  [name] 04/18/2022, 9:34 PM

## 2022-04-18 NOTE — Progress Notes (Addendum)
Patient moving in the bed, opening eyes to voice, tracking movement with eyes. Patient will not verbalize or respond to commands. Patient has been on BiPAP since 1655 pm. CBG at this time 188. E-Link notified, instructed to get an ABG.

## 2022-04-18 NOTE — Inpatient Diabetes Management (Signed)
Inpatient Diabetes Program Recommendations  AACE/ADA: New Consensus Statement on Inpatient Glycemic Control   Target Ranges:  Prepandial:   less than 140 mg/dL      Peak postprandial:   less than 180 mg/dL (1-2 hours)      Critically ill patients:  140 - 180 mg/dL    Latest Reference Range & Units 04/18/22 03:08 04/18/22 08:05  Glucose-Capillary 70 - 99 mg/dL 270 (H) 157 (H)    Latest Reference Range & Units 04/17/22 07:38 04/17/22 11:11 04/17/22 15:20 04/17/22 19:38 04/17/22 23:33  Glucose-Capillary 70 - 99 mg/dL 256 (H) 256 (H) 215 (H) 279 (H) 229 (H)   Review of Glycemic Control  Diabetes history: DM2 Outpatient Diabetes medications: Jardiance 10 mg daily Current orders for Inpatient glycemic control: Semglee 15 units BID, Novolog 0-20 units Q4H, Novolog 3 units Q4H for tube feeding coverage; Osmolite @ 20 ml/hr   Inpatient Diabetes Program Recommendations:     Insulin: Please consider increasing Semglee to 20 units BID and tube feeding coverage to Novolog 5 units Q4H. If glucose does not improve, may need to consider using ICU Glycemic Control Phase 2 IV insulin.  Thanks, Barnie Alderman, RN, MSN, [place] Diabetes Coordinator Inpatient Diabetes Program [phone] (Team Pager from 8am to [place])

## 2022-04-18 NOTE — Progress Notes (Signed)
NAME:  Mathew Lara, MRN:  [id number], DOB:  Jan 27, 1961, LOS: 8 ADMISSION DATE:  04/10/2022, CONSULTATION DATE:  8/5 REFERRING MD:  Darrell Jewel, CHIEF COMPLAINT: Abdominal pain  History of Present Illness:  Mathew Lara is an 61 y.o. M who presented to [facility] on 8/5 with a chief complaint of abdominal pain.  They have a past medical history of alcohol abuse, anxiety, CKD 3, COPD, OSA, type 2 diabetes, hypertension, hyperlipidemia, hep C  ED work-up revealed DKA, acute pancreatitis, AKI.  He was admitted to the hospital service.  Overnight he developed continuing respiratory distress, shock.   PCCM was consulted for transfer to [facility] for further management.  Still requiring pressors, abdominal pain is improved, respiratory status also appears better  Pertinent  Medical History  Alcohol abuse, anxiety, CKD 3, COPD, OSA, type 2 diabetes, hypertension, hyperlipidemia, hep C  Significant Hospital Events: Including procedures, antibiotic start and stop dates in addition to other pertinent events   8/5 presented to [facility] ED, transferred [facility] 8/7 on pressors  8/8 on BiPAP, worsening renal parameters 8/9 tolerating CRRT, on BiPAP 8/10-has been off BiPAP, on high flow oxygen and tolerating 8/11-UOP improving. CRRT with net even UF  Interim History / Subjective:   No BM. Continues to have abdominal discomfort. Adequate UOP  Objective   Blood pressure 110/77, pulse (!) 116, temperature 98.4 F (36.9 C), temperature source Axillary, resp. rate (!) 25, height '5\' 8"'$  (1.727 m), weight 131.8 kg, SpO2 94 %.    Vent Mode: BIPAP;PCV FiO2 (%):  [50 %-60 %] 60 % Set Rate:  [12 bmp] 12 bmp PEEP:  [5 cmH20] 5 cmH20   Intake/Output Summary (Last 24 hours) at 04/18/2022 0723 Last data filed at 04/18/2022 0700 Gross per 24 hour  Intake 1148 ml  Output 1574 ml  Net -426 ml   Filed Weights   04/16/22 0500 04/17/22 0500 04/18/22 0500  Weight: 133.5 kg 133.8 kg  131.8 kg   Physical Exam: General: Chronically ill-appearing, no acute distress HENT: [place], AT, BiPAP in place Eyes: EOMI, no scleral icterus Respiratory: Diminished breath sounds to auscultation bilaterally.  No crackles, wheezing or rales Cardiovascular: RRR, -M/R/G, no JVD GI: BS+, firm, mild tenderness Extremities:-Edema,-tenderness Neuro: AAO x2, CNII-XII grossly intact GU: External foley in place  Labs/imaging  Cr improving WBC increasing 19  CT abdomen pelvis concerning for acute pancreatitis, no biliary dilation, status postcholecystectomy Right upper quadrant ultrasound with hepatic steatosis Chest x-ray: Opacity of left lung, small right pleural effusion, no pneumothorax, no obvious infiltrate  Sputum sample sent for culture is not acceptable for testing  Resolved Hospital Problem list     Assessment & Plan:   Acute metabolic encephalopathy - improves with BiPAP, suspect hypoactive delirium -Re-orient, supportive management -Management as noted below -Consider CT head when off CRRT  History of interstitial lung disease from prior COVID infection Underlying chronic obstructive pulmonary disease Underlying obstructive sleep apnea -Wean high flow oxygen supplementation -Will have BiPAP PRN and nightly as tolerated -Brovana, Pulmicort and PRN albuterol  Acute kidney injury on chronic kidney disease 2/2 ATN in setting of acute pancreatitis and shock Fair UOP -Appreciate renal follow-up. Decreased UF goal with improved UOP -Continue to maintain renal perfusion -Avoid nephrotoxic medications  Leukocytosis, ?aspiration pneumonitis Afebrile. HDS. Bcx 8/10 NGTD. CXR 8/11 no acute findings. KUB 8/12 unremarkable -Monitor fever/WBC -Consider empiric antibiotics if clinically declines  Type 2 diabetes DKA - resolved -Semglee 15U BID -TF coverage  Acute  pancreatitis -Lipase normalized -Trend triglycerides  Multifactorial shock -Off pressors 8/11 -Continue  midodrine -Continue pain management  History of diastolic heart failure -Echo 12/25/2019 revealed ejection fraction of 60 to 65%  History of hepatitis C with advanced liver fibrosis -Followed up with Dr. Abbey Chatters with [facility] gastroenterology -Continue close monitoring  Constipation -SMOG enema -Bowel regimen   Best Practice (right click and "Reselect all SmartList Selections" daily)   Diet/type: clear liquids due to nausea DVT prophylaxis: prophylactic heparin  GI prophylaxis: H2B Lines: N/A Foley:  Yes, and it is still needed Code Status:  full code Last date of multidisciplinary goals of care discussion [pending] Updated daughter on telephone 8/13  The patient is critically ill with multiple organ systems failure and requires high complexity decision making for assessment and support, frequent evaluation and titration of therapies, application of advanced monitoring technologies and extensive interpretation of multiple databases.  Independent Critical Care Time: 13 Minutes.   Rodman Pickle, M.D. [facility] Pulmonary/Critical Care Medicine 04/18/2022 7:23 AM   Please see Amion for pager number to reach on-call Pulmonary and Critical Care Team.

## 2022-04-18 NOTE — Progress Notes (Addendum)
[facility] Progress Note Patient Name: Mathew Lara DOB: 05/06/1961 MRN: [id number]   Date of Service  04/18/2022  HPI/Events of Note  Pt yelling out in pain, received Oxycodone 10 x 2 and became stuporous, renal Pt on CRRT. Only takes 5 mg oxycodone at home. Currently on BIPAP.  On discussion with bedside RN he is having abdominal pain. Gas like. CT earlier done with acute pancreatitis. Lipase has been trending down. He has been started on simethicone.   Tube feeding has been held as no BM  eICU Interventions  Ordered Levsin and Tylenol Discussed with bedside RN     Intervention Category Intermediate Interventions: Pain - evaluation and management  [name] 04/18/2022, 11:12 PM

## 2022-04-19 ENCOUNTER — Inpatient Hospital Stay (HOSPITAL_COMMUNITY): Payer: Medicare Other

## 2022-04-19 DIAGNOSIS — J9601 Acute respiratory failure with hypoxia: Secondary | ICD-10-CM | POA: Diagnosis not present

## 2022-04-19 DIAGNOSIS — E1110 Type 2 diabetes mellitus with ketoacidosis without coma: Secondary | ICD-10-CM | POA: Diagnosis not present

## 2022-04-19 DIAGNOSIS — N179 Acute kidney failure, unspecified: Secondary | ICD-10-CM | POA: Diagnosis not present

## 2022-04-19 DIAGNOSIS — K859 Acute pancreatitis without necrosis or infection, unspecified: Secondary | ICD-10-CM | POA: Diagnosis not present

## 2022-04-19 LAB — RENAL FUNCTION PANEL
Albumin: 2 g/dL — ABNORMAL LOW (ref 3.5–5.0)
Albumin: 1.9 g/dL — ABNORMAL LOW (ref 3.5–5.0)
Anion gap: 13 (ref 5–15)
Anion gap: 10 (ref 5–15)
BUN: 32 mg/dL — ABNORMAL HIGH (ref 8–23)
BUN: 32 mg/dL — ABNORMAL HIGH (ref 8–23)
CO2: 23 mmol/L (ref 22–32)
CO2: 23 mmol/L (ref 22–32)
Calcium: 9.2 mg/dL (ref 8.9–10.3)
Calcium: 8.6 mg/dL — ABNORMAL LOW (ref 8.9–10.3)
Chloride: 101 mmol/L (ref 98–111)
Chloride: 100 mmol/L (ref 98–111)
Creatinine, Ser: 2.47 mg/dL — ABNORMAL HIGH (ref 0.61–1.24)
Creatinine, Ser: 2.86 mg/dL — ABNORMAL HIGH (ref 0.61–1.24)
GFR, Estimated: 29 mL/min — ABNORMAL LOW (ref >60)
GFR, Estimated: 24 mL/min — ABNORMAL LOW (ref >60)
Glucose, Bld: 169 mg/dL — ABNORMAL HIGH (ref 70–99)
Glucose, Bld: 271 mg/dL — ABNORMAL HIGH (ref 70–99)
Phosphorus: 2.4 mg/dL — ABNORMAL LOW (ref 2.5–4.6)
Phosphorus: 3.1 mg/dL (ref 2.5–4.6)
Potassium: 3.8 mmol/L (ref 3.5–5.1)
Potassium: 3.7 mmol/L (ref 3.5–5.1)
Sodium: 137 mmol/L (ref 135–145)
Sodium: 133 mmol/L — ABNORMAL LOW (ref 135–145)

## 2022-04-19 LAB — GLUCOSE, CAPILLARY
Glucose-Capillary: 135 mg/dL — ABNORMAL HIGH (ref 70–99)
Glucose-Capillary: 152 mg/dL — ABNORMAL HIGH (ref 70–99)
Glucose-Capillary: 168 mg/dL — ABNORMAL HIGH (ref 70–99)
Glucose-Capillary: 195 mg/dL — ABNORMAL HIGH (ref 70–99)
Glucose-Capillary: 244 mg/dL — ABNORMAL HIGH (ref 70–99)
Glucose-Capillary: 257 mg/dL — ABNORMAL HIGH (ref 70–99)

## 2022-04-19 LAB — CBC
HCT: 34 % — ABNORMAL LOW (ref 39.0–52.0)
Hemoglobin: 10.4 g/dL — ABNORMAL LOW (ref 13.0–17.0)
MCH: 27 pg (ref 26.0–34.0)
MCHC: 30.6 g/dL (ref 30.0–36.0)
MCV: 88.3 fL (ref 80.0–100.0)
Platelets: 242 10*3/uL (ref 150–400)
RBC: 3.85 MIL/uL — ABNORMAL LOW (ref 4.22–5.81)
RDW: 16.7 % — ABNORMAL HIGH (ref 11.5–15.5)
WBC: 19.8 10*3/uL — ABNORMAL HIGH (ref 4.0–10.5)
nRBC: 0.3 % — ABNORMAL HIGH (ref 0.0–0.2)

## 2022-04-19 LAB — LACTIC ACID, PLASMA
Lactic Acid, Venous: 1.5 mmol/L (ref 0.5–1.9)
Lactic Acid, Venous: 2.8 mmol/L (ref 0.5–1.9)
Lactic Acid, Venous: 1.9 mmol/L (ref 0.5–1.9)

## 2022-04-19 LAB — MAGNESIUM: Magnesium: 2.4 mg/dL (ref 1.7–2.4)

## 2022-04-19 MED ORDER — OXYCODONE HCL 5 MG PO TABS
5.0000 mg | ORAL_TABLET | Freq: Four times a day (QID) | ORAL | Status: DC | PRN
  Administered 2022-04-19: 5 mg
  Filled 2022-04-19: qty 1

## 2022-04-19 MED ORDER — SENNOSIDES-DOCUSATE SODIUM 8.6-50 MG PO TABS
1.0000 | ORAL_TABLET | Freq: Two times a day (BID) | ORAL | Status: DC
  Administered 2022-04-19: 1
  Filled 2022-04-19: qty 1

## 2022-04-19 MED ORDER — VANCOMYCIN HCL 10 G IV SOLR
2500.0000 mg | Freq: Once | INTRAVENOUS | Status: AC
  Administered 2022-04-19: 2500 mg via INTRAVENOUS
  Filled 2022-04-19: qty 2500

## 2022-04-19 MED ORDER — METHYLNALTREXONE BROMIDE 12 MG/0.6ML ~~LOC~~ SOLN
12.0000 mg | Freq: Once | SUBCUTANEOUS | Status: AC
  Administered 2022-04-19: 12 mg via SUBCUTANEOUS
  Filled 2022-04-19: qty 0.6

## 2022-04-19 MED ORDER — HYOSCYAMINE SULFATE 0.125 MG SL SUBL
0.3750 mg | SUBLINGUAL_TABLET | Freq: Once | SUBLINGUAL | Status: AC | PRN
  Administered 2022-04-19: 0.375 mg via SUBLINGUAL
  Filled 2022-04-19: qty 3

## 2022-04-19 MED ORDER — VANCOMYCIN HCL 1250 MG/250ML IV SOLN
1250.0000 mg | INTRAVENOUS | Status: DC
  Administered 2022-04-20 – 2022-04-21 (×2): 1250 mg via INTRAVENOUS
  Filled 2022-04-19 (×2): qty 250

## 2022-04-19 MED ORDER — OXYCODONE HCL 5 MG PO TABS: 10.0000 mg | ORAL_TABLET | Freq: Four times a day (QID) | ORAL | Status: DC | PRN

## 2022-04-19 MED ORDER — SODIUM PHOSPHATES 45 MMOLE/15ML IV SOLN
25.0000 mmol | Freq: Once | INTRAVENOUS | Status: AC
  Administered 2022-04-19: 25 mmol via INTRAVENOUS
  Filled 2022-04-19: qty 8.33

## 2022-04-19 MED ORDER — ASPIRIN 81 MG PO CHEW
81.0000 mg | CHEWABLE_TABLET | Freq: Every day | ORAL | Status: DC
  Administered 2022-04-19 – 2022-04-20 (×2): 81 mg
  Filled 2022-04-19 (×2): qty 1

## 2022-04-19 MED ORDER — OXYCODONE HCL 5 MG PO TABS
5.0000 mg | ORAL_TABLET | Freq: Four times a day (QID) | ORAL | Status: DC | PRN
  Administered 2022-04-19 – 2022-04-21 (×5): 5 mg
  Filled 2022-04-19 (×5): qty 1

## 2022-04-19 MED ORDER — IOHEXOL 9 MG/ML PO SOLN
ORAL | Status: AC
  Administered 2022-04-19: 500 mL
  Filled 2022-04-19: qty 1000

## 2022-04-19 MED ORDER — PIPERACILLIN-TAZOBACTAM 3.375 G IVPB 30 MIN
3.3750 g | Freq: Four times a day (QID) | INTRAVENOUS | Status: DC
  Administered 2022-04-19 – 2022-04-20 (×4): 3.375 g via INTRAVENOUS
  Filled 2022-04-19 (×8): qty 50

## 2022-04-19 MED ORDER — BISACODYL 10 MG RE SUPP
10.0000 mg | Freq: Every day | RECTAL | Status: DC
  Administered 2022-04-19: 10 mg via RECTAL
  Filled 2022-04-19: qty 1

## 2022-04-19 MED ORDER — B COMPLEX-C PO TABS
1.0000 | ORAL_TABLET | Freq: Every day | ORAL | Status: DC
  Administered 2022-04-19 – 2022-04-26 (×7): 1
  Filled 2022-04-19 (×7): qty 1

## 2022-04-19 MED ORDER — MIDODRINE HCL 5 MG PO TABS
10.0000 mg | ORAL_TABLET | Freq: Three times a day (TID) | ORAL | Status: DC
Start: 2022-04-19 — End: 2022-04-20
  Administered 2022-04-19 – 2022-04-20 (×5): 10 mg
  Filled 2022-04-19 (×5): qty 2

## 2022-04-19 MED ORDER — IOHEXOL 300 MG/ML  SOLN
100.0000 mL | Freq: Once | INTRAMUSCULAR | Status: AC | PRN
  Administered 2022-04-19: 100 mL via INTRAVENOUS

## 2022-04-19 MED ORDER — ACETAMINOPHEN 325 MG PO TABS
650.0000 mg | ORAL_TABLET | Freq: Four times a day (QID) | ORAL | Status: DC | PRN
  Administered 2022-04-19 – 2022-05-07 (×9): 650 mg
  Filled 2022-04-19 (×9): qty 2

## 2022-04-19 NOTE — Progress Notes (Signed)
[facility] Progress Note Patient Name: Mathew Lara DOB: Feb 29, 1961 MRN: [id number]   Date of Service  04/19/2022  HPI/Events of Note  Pt less stuperous from previopus oxycodone given, RN asking of his current order can reflect Oxyc 5-10 mg PO PRN. Renal Pt.  eICU Interventions  Decreased oxycodone dose but would hold if patient still somnolent     Intervention Category Intermediate Interventions: Other:  [name] 04/19/2022, 4:48 AM

## 2022-04-19 NOTE — Progress Notes (Signed)
NAME:  Mathew Lara, MRN:  [id number], DOB:  05/23/1961, LOS: 9 ADMISSION DATE:  04/10/2022, CONSULTATION DATE:  8/5 REFERRING MD:  Darrell Jewel, CHIEF COMPLAINT: Abdominal pain  History of Present Illness:  Mathew Lara is an 61 y.o. M who presented to the [facility] ED on 8/5 with a chief complaint of abdominal pain.  They have a past medical history of alcohol abuse, anxiety, CKD 3, COPD, OSA, type 2 diabetes, hypertension, hyperlipidemia, hep C  ED work-up revealed DKA, acute pancreatitis, AKI.  He was admitted to the hospital service.  Overnight he developed continuing respiratory distress, shock.   PCCM was consulted for transfer to [facility] for further management.  Still requiring pressors, abdominal pain is improved, respiratory status also appears better  Pertinent  Medical History  Alcohol abuse, anxiety, CKD 3, COPD, OSA, type 2 diabetes, hypertension, hyperlipidemia, hep C  Significant Hospital Events: Including procedures, antibiotic start and stop dates in addition to other pertinent events   8/5 presented to [facility] ED, transferred [facility] 8/7 on pressors  8/8 on BiPAP, worsening renal parameters 8/9 tolerating CRRT, on BiPAP 8/10-has been off BiPAP, on high flow oxygen and tolerating 8/11-UOP improving. CRRT with net even UF  Interim History / Subjective:   No BM. Continues to have abdominal discomfort. Adequate UOP  Objective   Blood pressure (!) 94/50, pulse (!) 117, temperature 100.1 F (37.8 C), temperature source Axillary, resp. rate (!) 29, height '5\' 8"'$  (1.727 m), weight 131.8 kg, SpO2 96 %.    Vent Mode: BIPAP;PCV FiO2 (%):  [50 %-60 %] 50 % Set Rate:  [12 bmp] 12 bmp   Intake/Output Summary (Last 24 hours) at 04/19/2022 0727 Last data filed at 04/19/2022 0700 Gross per 24 hour  Intake 1062.33 ml  Output 2039 ml  Net -976.67 ml    Filed Weights   04/16/22 0500 04/17/22 0500 04/18/22 0500  Weight: 133.5 kg 133.8 kg 131.8 kg    Physical Exam: General: Chronically ill-appearing, no acute distress, uncomfortable appearing HENT: [place] in place on 30L FiO2 60% Eyes: EOMI, no scleral icterus Respiratory: Diminished breath sounds to auscultation bilaterally.  No crackles, wheezing or rales Cardiovascular: RRR, no murmurs appreciated GI: hypoactive BS, firm, distended, mild tenderness Extremities:-Edema,-tenderness Neuro: A&O x2, following some commands  Labs/imaging  Cr worsening 2.08>2.47 WBC increasing 19  CT abdomen pelvis concerning for acute pancreatitis, no biliary dilation, status postcholecystectomy Right upper quadrant ultrasound with hepatic steatosis Chest x-ray: Opacity of left lung, small right pleural effusion, no pneumothorax, no obvious infiltrate  Sputum sample sent for culture is not acceptable for testing  Resolved Hospital Problem list     Assessment & Plan:   Acute metabolic encephalopathy - improves with BiPAP, suspect hypoactive delirium -Re-orient, supportive management -Management as noted below -Consider CT head when off CRRT  History of interstitial lung disease from prior COVID infection Underlying chronic obstructive pulmonary disease Underlying obstructive sleep apnea -Wean high flow oxygen supplementation -Will have BiPAP PRN and nightly as tolerated -Brovana, Pulmicort and PRN albuterol -CT chest for consideration of ILD flare  Acute kidney injury on chronic kidney disease 2/2 ATN in setting of acute pancreatitis and shock Remains on CRRT which was started on 04/13/22. UOP poor in last 24 hours cith worsening Cr.  -Appreciate renal follow-up.  -Continue to maintain renal perfusion -Avoid nephrotoxic medications  Leukocytosis, ?aspiration pneumonitis Afebrile. HDS. Bcx 8/10 NGTD. CXR 8/11 no acute findings. KUB 8/12 unremarkable -Monitor fever/WBC -Obtain Bcx, sputum culture,  lactic acid -Initiate vancomycin, Zosyn -CT abdomen/pelvis  Type 2 diabetes DKA -  resolved -Semglee 18U BID -TF coverage: Novolog 4u q4h  Acute pancreatitis -Lipase normalized -Trend triglycerides every 3 days: improving slowly, currently 321  Multifactorial shock Off pressors 8/11 -Continue midodrine '10mg'$  TID -Decrease pain management: oxycodone '5mg'$  q6h PRN; became stuperous with '10mg'$  overnight -Tylenol '650mg'$  q6h PRN  History of diastolic heart failure -Echo 12/25/2019 revealed ejection fraction of 60 to 65%  History of hepatitis C with advanced liver fibrosis -Followed up with Dr. Abbey Chatters with [facility] gastroenterology -Continue close monitoring  Constipation Stool smear x1 last night.  -Bowel regimen: Senna 1tab BID, Mylicon '80mg'$  4x/day, Dulcolax '10mg'$  daily PRN  Best Practice (right click and "Reselect all SmartList Selections" daily)   Diet/type: clear liquids due to nausea, feeding tube DVT prophylaxis: prophylactic heparin  GI prophylaxis: N/A Lines: N/A Foley:  Yes, and it is still needed Code Status:  full code Last date of multidisciplinary goals of care discussion [pending] Updated daughter on telephone 8/13  Wells Guiles, DO 04/19/2022, 7:35 AM PGY-2, [place]

## 2022-04-19 NOTE — Progress Notes (Signed)
Transported pt to CT on BiPAP and back to 9Q11 with no complications

## 2022-04-19 NOTE — Progress Notes (Signed)
Patient had stool smear with what looked like faint blood present. Ordered Occult blood per E-link MD.

## 2022-04-19 NOTE — Progress Notes (Signed)
Pharmacy Antibiotic Note  Mathew Lara is a 61 y.o. male admitted on 04/10/2022 with DKA, hypotension/shock and hypertriglyceridemia induced pancreatitis.  Pharmacy has been consulted for vancomycin and Zosyn dosing for possible PNA or IAI.  AKI currently on CRRT, Tmax 100.1, WBC 19.8.  Plan: Vanc '2500mg'$  IV x 1, then '1250mg'$  IV Q24H Zosyn 3.375gm IV Q6H, 30 min infusion Monitor CRRT tolerance/interruption, clinical progress, vanc level as indicated  Height: '5\' 8"'$  (172.7 cm) Weight: 131.8 kg (290 lb 9.1 oz) IBW/kg (Calculated) : 68.4  Temp (24hrs), Avg:99.3 F (37.4 C), Min:98.2 F (36.8 C), Max:100.1 F (37.8 C)  Recent Labs  Lab 04/15/22 0351 04/15/22 1640 04/16/22 0520 04/16/22 1639 04/17/22 0053 04/17/22 1650 04/18/22 0702 04/18/22 1652 04/19/22 0116  WBC 13.4*  --  14.7*  --  18.4*  --  19.8*  --  19.8*  CREATININE 3.72*   < > 2.71*   < > 2.13* 1.83* 1.92* 2.08* 2.47*   < > = values in this interval not displayed.    Estimated Creatinine Clearance: 41.1 mL/min (A) (by C-G formula based on SCr of 2.47 mg/dL (H)).    No Known Allergies  Zosyn x1 8/5, 8/14 >> Vanc 8/14 >>   8/5 BCx - negative 8/5 MRSA PCR - positive 8/10 Sputum - not acceptable for testing 8/14 TA - 8/14 BCx -   [place] Krist D. Mina Marble, PharmD, BCPS, [place] 04/19/2022, 10:08 AM

## 2022-04-19 NOTE — Progress Notes (Signed)
Discussed contrast use for CT chest/ab/pel for concern for sepsis or bowel obstruction with Nephrology and family. Risks of worsening pancreatitis and kidney function with use of contrast although patient is on CRRT. Attempting to identify source of infection and constipation with scan. Opting to proceed with scan with family permission.

## 2022-04-19 NOTE — Progress Notes (Signed)
[facility] Progress Note   Assessment/ Plan:   AKI/CKD stage IIIb-IV - presumably ischemic ATN in setting of acute pancreatitis, hypotension, pressor support, with concomitant ARB, SGLT-2 inhibitor and finerenone.  Initially non-oliguric but now UOP has decreased since being placed on pressors.  Started on CRRT 04/13/22.  RIJ temp HD cath placed 04/13/22.  Continue to hold losartan, Jardiance, and [place].   Keep even.  Keep MAP >65 Renal dose meds for eGFR <15 mL/min.   Avoid nephrotoxic agents such as IV contrast, NSAIDs, and phosphate containing bowel preps (FLEETS).   Preferred narcotics are hydromorphone, fentanyl, and methadone.  Morphine should be avoided. Acute pancreatitis - due to hypertriglyceridemia.  DKA - likely due to SGLT-2 inhibitor.   Shock - likely due to pancreatitis.  Currently on levophed.  For CT abd/ pelvis today. Acute hypoxic respiratory failure - with underlying COPD and OSA.  Per PCCM HTN - hold bp meds Hep C with advanced liver fibrosis Chronic diastolic CHF - stable.  Subjective:    Seen in room.  On CRRT.  Not improving- WBC up, still looking shocky- plan for CT abd/ pelvis with contrast (will have to accept risks of contrast here in this setting).  PCCM d/w pt's family.     Objective:   BP (!) 85/49   Pulse (!) 119   Temp 97.7 F (36.5 C) (Oral)   Resp (!) 25   Ht '5\' 8"'  (1.727 m)   Wt 131.8 kg   SpO2 93%   BMI 44.18 kg/m   Physical Exam: [id number] ill, lying on side in bed, moaning CVS: tachycardic Resp: tachypneic Abd: soft, obese, diffusely tender Ext: no LE edema ACCESS: R IJ nontunneled HD cath  Labs: BMET Recent Labs  Lab 04/16/22 0520 04/16/22 1639 04/17/22 0053 04/17/22 1650 04/18/22 0702 04/18/22 1652 04/18/22 1956 04/19/22 0116  NA 136 139 138 139 140 138 138 137  K 3.6 3.9 3.7 4.0 3.7 3.8 3.9 3.8  CL 102 104 102 103 105 101  --  101  CO2 '23 24 23 25 23 25  ' --  23  GLUCOSE 190* 225* 253* 255* 212* 217*   --  169*  BUN 27* 23 26* 27* 30* 32*  --  32*  CREATININE 2.71* 2.30* 2.13* 1.83* 1.92* 2.08*  --  2.47*  CALCIUM 8.4* 8.8* 9.1 9.4 9.0 9.4  --  9.2  PHOS 2.9 2.9 2.7 2.6 2.5 2.3*  --  2.4*   CBC Recent Labs  Lab 04/13/22 0456 04/14/22 0225 04/15/22 0351 04/16/22 0520 04/17/22 0053 04/18/22 0702 04/18/22 1956 04/19/22 0116  WBC 16.2* 11.0* 13.4* 14.7* 18.4* 19.8*  --  19.8*  NEUTROABS 13.3* 8.8* 11.0* 12.1*  --   --   --   --   HGB 11.4* 10.4* 10.4* 10.0* 10.9* 10.3* 11.6* 10.4*  HCT 37.1* 32.9* 34.2* 32.5* 35.5* 34.1* 34.0* 34.0*  MCV 86.5 85.2 87.5 86.7 87.2 87.4  --  88.3  PLT 233 166 204 200 217 218  --  242      Medications:     arformoterol  15 mcg Nebulization BID   aspirin  81 mg Per Tube Daily   B-complex with vitamin C  1 tablet Per Tube Daily   bisacodyl  10 mg Rectal Daily   budesonide (PULMICORT) nebulizer solution  0.5 mg Nebulization BID   Chlorhexidine Gluconate Cloth  6 each Topical Daily   feeding supplement  237 mL Oral TID BM   feeding supplement (PROSource  TF20)  60 mL Per Tube TID   heparin  5,000 Units Subcutaneous Q8H   insulin aspart  0-20 Units Subcutaneous Q4H   insulin aspart  4 Units Subcutaneous Q4H   insulin glargine-yfgn  18 Units Subcutaneous BID   metoCLOPramide (REGLAN) injection  10 mg Intravenous Q6H   midodrine  10 mg Per Tube TID AC   mouth rinse  15 mL Mouth Rinse 4 times per day   senna-docusate  1 tablet Per Tube BID   simethicone  80 mg Per Tube QID   sodium chloride flush  10-40 mL Intracatheter Q12H     Madelon Lips MD 04/19/2022, 11:35 AM

## 2022-04-20 DIAGNOSIS — N179 Acute kidney failure, unspecified: Secondary | ICD-10-CM | POA: Diagnosis not present

## 2022-04-20 DIAGNOSIS — R935 Abnormal findings on diagnostic imaging of other abdominal regions, including retroperitoneum: Secondary | ICD-10-CM

## 2022-04-20 DIAGNOSIS — K859 Acute pancreatitis without necrosis or infection, unspecified: Secondary | ICD-10-CM | POA: Diagnosis not present

## 2022-04-20 DIAGNOSIS — J9601 Acute respiratory failure with hypoxia: Secondary | ICD-10-CM | POA: Diagnosis not present

## 2022-04-20 DIAGNOSIS — E1110 Type 2 diabetes mellitus with ketoacidosis without coma: Secondary | ICD-10-CM | POA: Diagnosis not present

## 2022-04-20 LAB — CBC
HCT: 34.4 % — ABNORMAL LOW (ref 39.0–52.0)
Hemoglobin: 10.2 g/dL — ABNORMAL LOW (ref 13.0–17.0)
MCH: 26.4 pg (ref 26.0–34.0)
MCHC: 29.7 g/dL — ABNORMAL LOW (ref 30.0–36.0)
MCV: 89.1 fL (ref 80.0–100.0)
Platelets: 288 10*3/uL (ref 150–400)
RBC: 3.86 MIL/uL — ABNORMAL LOW (ref 4.22–5.81)
RDW: 16.6 % — ABNORMAL HIGH (ref 11.5–15.5)
WBC: 23.6 10*3/uL — ABNORMAL HIGH (ref 4.0–10.5)
nRBC: 0.3 % — ABNORMAL HIGH (ref 0.0–0.2)

## 2022-04-20 LAB — GLUCOSE, CAPILLARY
Glucose-Capillary: 154 mg/dL — ABNORMAL HIGH (ref 70–99)
Glucose-Capillary: 151 mg/dL — ABNORMAL HIGH (ref 70–99)
Glucose-Capillary: 170 mg/dL — ABNORMAL HIGH (ref 70–99)
Glucose-Capillary: 164 mg/dL — ABNORMAL HIGH (ref 70–99)
Glucose-Capillary: 231 mg/dL — ABNORMAL HIGH (ref 70–99)
Glucose-Capillary: 218 mg/dL — ABNORMAL HIGH (ref 70–99)

## 2022-04-20 LAB — RENAL FUNCTION PANEL
Albumin: 2.1 g/dL — ABNORMAL LOW (ref 3.5–5.0)
Albumin: 2 g/dL — ABNORMAL LOW (ref 3.5–5.0)
Anion gap: 11 (ref 5–15)
Anion gap: 15 (ref 5–15)
BUN: 30 mg/dL — ABNORMAL HIGH (ref 8–23)
BUN: 31 mg/dL — ABNORMAL HIGH (ref 8–23)
CO2: 23 mmol/L (ref 22–32)
CO2: 21 mmol/L — ABNORMAL LOW (ref 22–32)
Calcium: 9.3 mg/dL (ref 8.9–10.3)
Calcium: 9.2 mg/dL (ref 8.9–10.3)
Chloride: 100 mmol/L (ref 98–111)
Chloride: 101 mmol/L (ref 98–111)
Creatinine, Ser: 2.81 mg/dL — ABNORMAL HIGH (ref 0.61–1.24)
Creatinine, Ser: 2.97 mg/dL — ABNORMAL HIGH (ref 0.61–1.24)
GFR, Estimated: 25 mL/min — ABNORMAL LOW (ref >60)
GFR, Estimated: 23 mL/min — ABNORMAL LOW (ref >60)
Glucose, Bld: 168 mg/dL — ABNORMAL HIGH (ref 70–99)
Glucose, Bld: 174 mg/dL — ABNORMAL HIGH (ref 70–99)
Phosphorus: 3.5 mg/dL (ref 2.5–4.6)
Phosphorus: 3.4 mg/dL (ref 2.5–4.6)
Potassium: 3.8 mmol/L (ref 3.5–5.1)
Potassium: 4.8 mmol/L (ref 3.5–5.1)
Sodium: 134 mmol/L — ABNORMAL LOW (ref 135–145)
Sodium: 137 mmol/L (ref 135–145)

## 2022-04-20 LAB — HEPATIC FUNCTION PANEL
ALT: 20 U/L (ref 0–44)
AST: 35 U/L (ref 15–41)
Albumin: 2 g/dL — ABNORMAL LOW (ref 3.5–5.0)
Alkaline Phosphatase: 103 U/L (ref 38–126)
Bilirubin, Direct: 0.2 mg/dL (ref 0.0–0.2)
Indirect Bilirubin: 0.4 mg/dL (ref 0.3–0.9)
Total Bilirubin: 0.6 mg/dL (ref 0.3–1.2)
Total Protein: 8 g/dL (ref 6.5–8.1)

## 2022-04-20 LAB — LACTIC ACID, PLASMA: Lactic Acid, Venous: 1.6 mmol/L (ref 0.5–1.9)

## 2022-04-20 LAB — MAGNESIUM: Magnesium: 2.6 mg/dL — ABNORMAL HIGH (ref 1.7–2.4)

## 2022-04-20 MED ORDER — MUPIROCIN 2 % EX OINT
1.0000 | TOPICAL_OINTMENT | Freq: Two times a day (BID) | CUTANEOUS | Status: AC
  Administered 2022-04-20 – 2022-04-24 (×10): 1 via NASAL
  Filled 2022-04-20 (×2): qty 22

## 2022-04-20 MED ORDER — METRONIDAZOLE 500 MG/100ML IV SOLN
500.0000 mg | Freq: Two times a day (BID) | INTRAVENOUS | Status: DC
  Administered 2022-04-20 – 2022-04-21 (×3): 500 mg via INTRAVENOUS
  Filled 2022-04-20 (×3): qty 100

## 2022-04-20 MED ORDER — SODIUM CHLORIDE 0.9 % IV SOLN
500.0000 mg | INTRAVENOUS | Status: DC
  Administered 2022-04-20 – 2022-04-21 (×2): 500 mg via INTRAVENOUS
  Filled 2022-04-20 (×2): qty 5

## 2022-04-20 MED ORDER — SODIUM CHLORIDE 0.9 % IV SOLN
12.5000 mg | Freq: Once | INTRAVENOUS | Status: AC | PRN
Start: 2022-04-20 — End: 2022-04-20
  Administered 2022-04-20: 12.5 mg via INTRAVENOUS
  Filled 2022-04-20: qty 0.5

## 2022-04-20 MED ORDER — SODIUM CHLORIDE 0.9 % IV SOLN
2.0000 g | Freq: Two times a day (BID) | INTRAVENOUS | Status: DC
  Administered 2022-04-20 – 2022-04-21 (×3): 2 g via INTRAVENOUS
  Filled 2022-04-20 (×3): qty 12.5

## 2022-04-20 MED ORDER — HYDROMORPHONE HCL 1 MG/ML IJ SOLN
0.5000 mg | Freq: Once | INTRAMUSCULAR | Status: AC | PRN
  Administered 2022-04-20: 0.5 mg via INTRAVENOUS
  Filled 2022-04-20: qty 0.5

## 2022-04-20 MED ORDER — BACLOFEN 5 MG HALF TABLET
5.0000 mg | ORAL_TABLET | Freq: Two times a day (BID) | ORAL | Status: DC
  Administered 2022-04-20 (×2): 5 mg via ORAL
  Filled 2022-04-20 (×3): qty 1

## 2022-04-20 MED ORDER — MIDODRINE HCL 5 MG PO TABS
10.0000 mg | ORAL_TABLET | Freq: Three times a day (TID) | ORAL | Status: DC
  Administered 2022-04-20 – 2022-04-21 (×2): 10 mg
  Filled 2022-04-20 (×2): qty 2

## 2022-04-20 MED ORDER — METRONIDAZOLE 500 MG/100ML IV SOLN: 500.0000 mg | Freq: Three times a day (TID) | INTRAVENOUS | Status: DC

## 2022-04-20 NOTE — Progress Notes (Addendum)
[place] Progress Note Patient Name: Mathew Lara DOB: 1961/04/20 MRN: [id number]   Date of Service  04/20/2022  HPI/Events of Note  Patient with right upper quadrant pain and hiccups related to acute pancreatitis.  eICU Interventions  Dilaudid 0.5 mg iv + Chlorpromazine 12.5 mg iv x 1 ordered.        Kerry Kass Mathew Postiglione 04/20/2022, 2:46 AM

## 2022-04-20 NOTE — Progress Notes (Signed)
[facility] Progress Note   Assessment/ Plan:   AKI/CKD stage IIIb-IV - presumably ischemic ATN in setting of acute pancreatitis, hypotension, pressor support, with concomitant ARB, SGLT-2 inhibitor and finerenone.  Initially non-oliguric but now UOP has decreased since being placed on pressors.  Started on CRRT 04/13/22.  RIJ temp HD cath placed 04/13/22.  Continue to hold losartan, Jardiance, and [place].   Keep even- especially in setting of persistent hypotension.  Keep MAP >65 Renal dose meds for eGFR <15 mL/min.   Avoid nephrotoxic agents such as IV contrast, NSAIDs, and phosphate containing bowel preps (FLEETS).   Preferred narcotics are hydromorphone, fentanyl, and methadone.  Morphine should be avoided. Acute pancreatitis - due to hypertriglyceridemia.  DKA- resolved  Shock - likely due to pancreatitis.  Currently on levophed.  Repeat CT abd pelvis 8/14 with edematous pancreas but no evidence of necrotizing pancreatitis Acute hypoxic respiratory failure - with underlying COPD and OSA.  Per PCCM HTN - hold bp meds Hep C with advanced liver fibrosis Chronic diastolic CHF - stable.  Subjective:    Seen in room.  Continues to remain on CRRT.  BP soft, tachycardic.   Objective:   BP 95/60   Pulse (!) 121   Temp 98.4 F (36.9 C) (Oral)   Resp (!) 31   Ht '5\' 8"'  (1.727 m)   Wt 129.1 kg   SpO2 94%   BMI 43.28 kg/m   Physical Exam: [id number] ill, lying on side in bed, moaning CVS: tachycardic Resp: tachypneic Abd: soft, obese, diffusely tender Ext: no LE edema ACCESS: R IJ nontunneled HD cath  Labs: BMET Recent Labs  Lab 04/17/22 0053 04/17/22 1650 04/18/22 0702 04/18/22 1652 04/18/22 1956 04/19/22 0116 04/19/22 1710 04/20/22 0130  NA 138 139 140 138 138 137 133* 134*  K 3.7 4.0 3.7 3.8 3.9 3.8 3.7 3.8  CL 102 103 105 101  --  101 100 100  CO2 '23 25 23 25  ' --  '23 23 23  ' GLUCOSE 253* 255* 212* 217*  --  169* 271* 168*  BUN 26* 27* 30* 32*  --   32* 32* 30*  CREATININE 2.13* 1.83* 1.92* 2.08*  --  2.47* 2.86* 2.81*  CALCIUM 9.1 9.4 9.0 9.4  --  9.2 8.6* 9.3  PHOS 2.7 2.6 2.5 2.3*  --  2.4* 3.1 3.5   CBC Recent Labs  Lab 04/14/22 0225 04/15/22 0351 04/16/22 0520 04/17/22 0053 04/18/22 0702 04/18/22 1956 04/19/22 0116 04/20/22 0130  WBC 11.0* 13.4* 14.7* 18.4* 19.8*  --  19.8* 23.6*  NEUTROABS 8.8* 11.0* 12.1*  --   --   --   --   --   HGB 10.4* 10.4* 10.0* 10.9* 10.3* 11.6* 10.4* 10.2*  HCT 32.9* 34.2* 32.5* 35.5* 34.1* 34.0* 34.0* 34.4*  MCV 85.2 87.5 86.7 87.2 87.4  --  88.3 89.1  PLT 166 204 200 217 218  --  242 288      Medications:     arformoterol  15 mcg Nebulization BID   B-complex with vitamin C  1 tablet Per Tube Daily   baclofen  5 mg Oral BID   budesonide (PULMICORT) nebulizer solution  0.5 mg Nebulization BID   Chlorhexidine Gluconate Cloth  6 each Topical Daily   feeding supplement  237 mL Oral TID BM   feeding supplement (PROSource TF20)  60 mL Per Tube TID   heparin  5,000 Units Subcutaneous Q8H   insulin aspart  0-20 Units Subcutaneous Q4H  insulin aspart  4 Units Subcutaneous Q4H   insulin glargine-yfgn  18 Units Subcutaneous BID   metoCLOPramide (REGLAN) injection  10 mg Intravenous Q6H   midodrine  10 mg Per Tube TID AC   mupirocin ointment  1 Application Nasal BID   mouth rinse  15 mL Mouth Rinse 4 times per day   simethicone  80 mg Per Tube QID   sodium chloride flush  10-40 mL Intracatheter Q12H     Madelon Lips MD 04/20/2022, 12:15 PM

## 2022-04-20 NOTE — TOC Progression Note (Signed)
Transition of Care [facility]) - Progression Note    Patient Details  Name: Mathew Lara MRN: [id number] Date of Birth: Dec 28, 1960  [facility] of [facility]) CM/SW Contact  Pollie Friar, RN Phone Number: 04/20/2022, 2:33 PM  Clinical Narrative:    Pt continues with CRRT and multiple IV abx for cavitary pneumonia. He has been either on Bipap or HFNC at 30 L.  TOC following.     Barriers to Discharge: Continued Medical Work up  Expected Discharge Plan and Services                                                 Social Determinants of Health (SDOH) Interventions    Readmission Risk Interventions     No data to display

## 2022-04-20 NOTE — Consult Note (Signed)
[facility] Consult: 10:28 AM 04/20/2022  LOS: 10 days    Referring Provider: Dr Loanne Drilling  Primary Care Physician:  [name], MD Primary Gastroenterologist:  [facility].  Dr Abbey Chatters.      Reason for Consultation:  CBD measuring 10.8 mm on ultrasound    HPI: Mathew Lara is a 61 y.o. male.  PMH  Obesity.  OHS/OSA.  HCV.  Treated with [name] 2016 with SVR.  Hep a, hep B immune.  Incidental finding of coarse liver texture on chest CT 01/2021.  Follow-up ultrasound showed fatty liver without masses.  Also suspicion for gallbladder polyp/mass and underwent lap chole with liver biopsy 03/23/2021.  Pathology confirmed chronic cholecystitis, cholesterolosis.  Liver pathology showed chronic hepatitis with minimal activity (grade 1), bridging fibrosis (stage 3) consistent with his history of hep C.  Mild macrovascular steatosis.  Followed yearly by GI and last office visit was mid June 2023. 04/2018 EGD.  For FB in esophagus.  Food impacted at mid 3rd of esophagus.  It was a large meat bolus.  This was removed with the assistance of Jabier Mutton net.  We will relook after removal of meat showed inflamed esophagitis, Schatzki's ring, hiatal hernia. 06/2021 EGD.  For dysphagia.  Dr. Abbey Chatters dilated a moderate Schatzki's ring.  Biopsied erosive/erythematous gastritis.  Duodenal bulb and D1, D2 normal. Path: Benign gastric mucosa, reactive changes.  No H. pylori. 06/2021 Colonoscopy.  Screening study.  Nonbleeding internal hemorrhoids.  5 mm polyp removed from rectosigmoid, 8 mm polyp removed from sigmoid, clip applied to site of sigmoid polypectomy.  Significant looping of colon but able to reach IC valve.  Path: Tubular adenomas and benign colonic mucosa, no HGD.    Patient's been at [facility] since 8/5.  Initially went to  [facility] for abdominal pain.  Found to have DKA, acidosis. Triglycerides 1864.   Developed hypotension/shock/respiratory distress.  Transferred for advanced care. This was/is initial presentation for new diagnosis of DM 2. 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  Initiated CRRT 8/8.  Diagnosed/ruled in for acute pancreatitis attributed to hypertriglyceridemia.  Shock physiology.  Acute hypoxic respiratory failure.  Stable diastolic CHF.  Consistently hypotensive as low as 60s/40s.  Consistently tachycardic up to the 120s.  He is being treated with high flow nasal cannula oxygen but has not required vent. Getting Dilaudid, oxycodone for pain.  + hiccups, Rx baclofen, Thorazine..   On Vanc, maxipime, flagyl,  for rising WBCs w/O fever and concern for pneumonia, intrabdominal infection. Track tube feedings placed on hold because he had not had a bowel movement.  Portable abdominal films 3 days ago with no signs of obstruction, unremarkable BG P.  Received Relistor yesterday 8/14.  However plan is to resume this today at trickle rate given that he had a large bowel movement yesterday.  Also allowed clears of which he consumes minor amounts.  RN informs me pt not complaining much of abdominal pain.   04/10/2022 CTAP wo  contrast: Acute pancreatitis.  Marked diffuse pancreatic edema and peripancreatic fat stranding  and fluid.  No signs of early pseudocysts.  Pancreatic duct, biliary ducts unremarkable.  Status postcholecystectomy. 04/10/2022 RUQ [place]: Gallbladder surgically absent.  CBD 10.8, was 3 mm on 02/26/2022 ultrasound.  Mild increased liver echo.  Dopplers of portal vein normal. 04/19/2022 CTAP/chest w contrast: Increased fluid surrounding pancreas compared with 8/5 CT,  pancreatic edema but no evidence for pancreatic necrosis.  Feeding tube positioned/terminating in fourth duodenum.  Hb 10.2, MCV 89.  Normal platelets.  WBCs 23.6. Na 134.  Glucose 168.  BUNs/creatinine 30/2.8, GFR  25 Trigs 1864... 321, steadily downtrending. Lipase 2291.Marland Kitchen  128 T. bili, alk phos, transaminases all consistently normal on repeat assays.    Past Medical History:  Diagnosis Date   Alcohol abuse    Anxiety    Bronchitis    Chronic hip pain    CKD (chronic kidney disease) stage 3, GFR 30-59 ml/min (HCC)    COPD (chronic obstructive pulmonary disease) ([place])    DKA (diabetic ketoacidosis) ([place]) 04/10/2022   Essential hypertension    Hepatitis C    Mixed hyperlipidemia    Sleep apnea     Past Surgical History:  Procedure Laterality Date   BALLOON DILATION N/A 07/06/2021   Procedure: BALLOON DILATION;  Surgeon: [name], DO;  [facility];  Service: Endoscopy;  Laterality: N/A;   BIOPSY  07/06/2021   Procedure: BIOPSY;  Surgeon: [name], DO;  [facility];  Service: Endoscopy;;   CATARACT EXTRACTION Right    CHOLECYSTECTOMY N/A 03/23/2021   Procedure: LAPAROSCOPIC CHOLECYSTECTOMY;  Surgeon: [name], MD;  Location: [facility];  Service: General;  Laterality: N/A;   COLONOSCOPY N/A 01/23/2014   SLF:The LEFT COLON IS EXTREMELY redundant/TWO RECTAL POLYPS REMOVED/SMALL VOLUME RECTAL BLEEDING MOST LIKELY DUE TO Small internal hemorrhoids. path with prolapse type polyp of benign-mucosa. no adenomas   COLONOSCOPY WITH PROPOFOL N/A 07/06/2021   non-bleeding internal hemorrhoids, one 8 mm polyp in sigmod s/p removal and clip placed. One 5 mm polyp at recto-sigmoid s/p removal. 3 year surveillance. Tubular adenomas   ESOPHAGOGASTRODUODENOSCOPY N/A 01/23/2014   NID:POEUMPNT ring at the gastroesophageal junction/Medium sized hiatal hernia/NDYSPEPSIA MOST LIKELY DUE TO GERD/MILD Non-erosive gastritis   ESOPHAGOGASTRODUODENOSCOPY N/A 04/18/2018   food impaction, small hiatal hernia   ESOPHAGOGASTRODUODENOSCOPY (EGD) WITH PROPOFOL N/A 07/06/2021   moderate Schatzki ring s/p dilation, gastritis s/p biopsy, normal duodenum. Negative H.pylori.    HEMORRHOID BANDING  01/23/2014   Procedure: HEMORRHOID BANDING;  Surgeon: Danie Binder, MD;  [facility];  Service: Endoscopy;;   JOINT REPLACEMENT Right 2017   LIVER BIOPSY N/A 03/23/2021   Procedure: LAPAROSCOPIC LIVER BIOPSY;  Surgeon: [name], MD;  Location: [facility];  Service: General;  Laterality: N/A;   MOUTH SURGERY     POLYPECTOMY  07/06/2021   Procedure: POLYPECTOMY;  Surgeon: [name], DO;  [facility];  Service: Endoscopy;;   TOTAL HIP ARTHROPLASTY Right 06/04/2016   Procedure: RIGHT TOTAL HIP ARTHROPLASTY ANTERIOR APPROACH;  Surgeon: Mcarthur Rossetti, MD;  Location: [facility];  Service: Orthopedics;  Laterality: Right;   TOTAL HIP ARTHROPLASTY Left 03/02/2019   Procedure: LEFT TOTAL HIP ARTHROPLASTY ANTERIOR APPROACH;  Surgeon: Mcarthur Rossetti, MD;  Location: [facility];  Service: Orthopedics;  Laterality: Left;    Prior to Admission medications   Medication Sig Start Date End Date Taking? Authorizing Provider  albuterol (PROAIR HFA) 108 (90 Base) MCG/ACT inhaler Inhale 2 puffs into the lungs every 4 (four)  hours as needed for wheezing or shortness of breath. 02/15/22   Chesley Mires, MD  amLODipine (NORVASC) 10 MG tablet Take 10 mg by mouth daily. 02/05/19   [provider]  amoxicillin-clavulanate (AUGMENTIN) 875-125 MG tablet Take 1 tablet by mouth 2 (two) times daily. 03/16/22   Chesley Mires, MD  aspirin EC 81 MG tablet Take 81 mg by mouth daily.    [provider]  cholecalciferol (VITAMIN D) 25 MCG (1000 UNIT) tablet Take 1,000 Units by mouth daily.    [provider]  DULoxetine (CYMBALTA) 60 MG capsule Take 60 mg by mouth daily. 06/15/21   [provider]  fenofibrate 160 MG tablet Take 160 mg by mouth daily. 02/26/22   [provider]  hydrALAZINE (APRESOLINE) 50 MG tablet Take 100 mg by mouth at bedtime. 02/04/22   [provider]  hydrOXYzine (ATARAX) 50 MG tablet Take 50  mg by mouth at bedtime. 02/05/22   [provider]  hydrOXYzine (ATARAX/VISTARIL) 25 MG tablet Take 50 mg by mouth at bedtime.    [provider]  JARDIANCE 10 MG TABS tablet Take 10 mg by mouth every morning. 03/08/22   [provider]  KERENDIA 10 MG TABS Take 1 tablet by mouth daily. 03/16/22   [provider]  loratadine (CLARITIN) 10 MG tablet Take 1 tablet (10 mg total) by mouth daily. 09/02/19   Barton Dubois, MD  losartan (COZAAR) 50 MG tablet Take 50 mg by mouth daily. 02/11/20   [provider]  oxyCODONE (OXY IR/ROXICODONE) 5 MG immediate release tablet Take 5 mg by mouth every 8 (eight) hours as needed for moderate pain. 02/25/20   [provider]  pantoprazole (PROTONIX) 40 MG tablet Take 1 tablet (40 mg total) by mouth 2 (two) times daily before a meal. 30 minutes before breakfast 03/08/22 09/04/22  Annitta Needs, NP  sildenafil (VIAGRA) 100 MG tablet Take 100 mg by mouth daily as needed for erectile dysfunction. 04/21/21   [provider]    Scheduled Meds:  arformoterol  15 mcg Nebulization BID   B-complex with vitamin C  1 tablet Per Tube Daily   baclofen  5 mg Oral BID   budesonide (PULMICORT) nebulizer solution  0.5 mg Nebulization BID   Chlorhexidine Gluconate Cloth  6 each Topical Daily   feeding supplement  237 mL Oral TID BM   feeding supplement (PROSource TF20)  60 mL Per Tube TID   heparin  5,000 Units Subcutaneous Q8H   insulin aspart  0-20 Units Subcutaneous Q4H   insulin aspart  4 Units Subcutaneous Q4H   insulin glargine-yfgn  18 Units Subcutaneous BID   metoCLOPramide (REGLAN) injection  10 mg Intravenous Q6H   midodrine  10 mg Per Tube TID AC   mupirocin ointment  1 Application Nasal BID   mouth rinse  15 mL Mouth Rinse 4 times per day   simethicone  80 mg Per Tube QID   sodium chloride flush  10-40 mL Intracatheter Q12H   Infusions:  sodium chloride Stopped (04/16/22 0343)   azithromycin 500 mg  (04/20/22 1007)   ceFEPime (MAXIPIME) IV 200 mL/hr at 04/20/22 1000   feeding supplement (OSMOLITE 1.5 CAL) Stopped (04/18/22 1534)   heparin 10,000 units/ 20 mL infusion syringe 1,000 Units/hr (04/20/22 0718)   metronidazole     prismasol BGK 2/2.5 replacement solution 400 mL/hr at 04/20/22 0202   prismasol BGK 2/2.5 replacement solution 400 mL/hr at 04/20/22 0105   prismasol BGK 4/2.5  1,500 mL/hr at 04/20/22 0932   vancomycin     PRN Meds: sodium chloride, acetaminophen, albuterol, dextrose, heparin, heparin, ondansetron (ZOFRAN) IV, mouth rinse, oxyCODONE, sodium chloride flush, white petrolatum   Allergies as of 04/10/2022   (No Known Allergies)    Family History  Problem Relation Age of Onset   Cancer Mother    Colon cancer Neg Hx    Colon polyps Neg Hx     Social History   Socioeconomic History   Marital status: Single    Spouse name: Not on file   Number of children: 1   Years of education: 10   Highest education level: Not on file  Occupational History    Comment: [employer]  Tobacco Use   Smoking status: Former    Packs/day: 1.00    Years: 42.00    Total pack years: 42.00    Types: Cigarettes    Start date: 09/07/1971    Quit date: 05/28/2014    Years since quitting: 7.9    Passive exposure: Past   Smokeless tobacco: Never   Tobacco comments:    quit in November 2015 after smoking x 20 yrs.  Vaping Use   Vaping Use: Never used  Substance and Sexual Activity   Alcohol use: Not Currently    Alcohol/week: 0.0 standard drinks of alcohol   Drug use: No   Sexual activity: Not on file  Other Topics Concern   Not on file  Social History Narrative       patient consumes 4 cups of caffeine daily.      Patient stopped drinking years ago.   Social Determinants of Health   Financial Resource Strain: Not on file  Food Insecurity: Not on file  Transportation Needs: Not on file  Physical Activity: Not on file  Stress: Not on file  Social Connections: Not  on file  Intimate Partner Violence: Not on file    REVIEW OF SYSTEMS: Unable to go through a review of systems with the patient.   PHYSICAL EXAM: Vital signs in last 24 hours: Vitals:   04/20/22 1000 04/20/22 1015  BP: 95/60   Pulse: (!) 123 (!) 121  Resp: (!) 22 (!) 31  Temp:    SpO2: 94% 94%   Wt Readings from Last 3 Encounters:  04/20/22 129.1 kg  03/16/22 134.5 kg  02/17/22 (!) 136.4 kg    General: Patient is obese, ill-appearing.  Moaning with limited verbalization but does follow commands. Head: No asymmetry or swelling Eyes: No conjunctival pallor or scleral icterus Ears: No obvious hearing deficit Nose: No congestion or discharge.  Core track feeding tube in place Mouth: Mucosa is moist, pink, clear.  Tongue midline. Neck: No JVD, no masses, no thyromegaly Lungs: Clear to auscultation in front.  Nasal cannula oxygen in place. Heart: Tacky, regular. Abdomen: Moderately distended and tense.  Bowel sounds reduced but no high-pitched or tinkling bowel sounds.  No tenderness.  No masses, HSM, hernias..   Rectal: Deferred. GU: Urine collection device in place.  Canister on wall has no urine in it. Musc/Skeltl: No obvious joint deformity, redness or swelling Extremities: Pitting swelling in the hands/arms. Neurologic: Follows simple commands.  Wiggles his fingers and toes.  Limited verbal expression. Skin: No open sores or rash on incomplete survey   Psych: Calm, cooperative.  Intake/Output from previous day: 08/14 0701 - 08/15 0700 In: 2600 [P.O.:1560; I.V.:10; NG/GT:150; IV Piggyback:880] Out: 3068  Intake/Output this shift: Total I/O In: 202.7 [I.V.:15; NG/GT:140; IV Piggyback:47.7]  Out: 379   LAB RESULTS: Recent Labs    04/18/22 0702 04/18/22 1956 04/19/22 0116 04/20/22 0130  WBC 19.8*  --  19.8* 23.6*  HGB 10.3* 11.6* 10.4* 10.2*  HCT 34.1* 34.0* 34.0* 34.4*  PLT 218  --  242 288   BMET Lab Results  Component Value Date   NA 134 (L) 04/20/2022    NA 133 (L) 04/19/2022   NA 137 04/19/2022   K 3.8 04/20/2022   K 3.7 04/19/2022   K 3.8 04/19/2022   CL 100 04/20/2022   CL 100 04/19/2022   CL 101 04/19/2022   CO2 23 04/20/2022   CO2 23 04/19/2022   CO2 23 04/19/2022   GLUCOSE 168 (H) 04/20/2022   GLUCOSE 271 (H) 04/19/2022   GLUCOSE 169 (H) 04/19/2022   BUN 30 (H) 04/20/2022   BUN 32 (H) 04/19/2022   BUN 32 (H) 04/19/2022   CREATININE 2.81 (H) 04/20/2022   CREATININE 2.86 (H) 04/19/2022   CREATININE 2.47 (H) 04/19/2022   CALCIUM 9.3 04/20/2022   CALCIUM 8.6 (L) 04/19/2022   CALCIUM 9.2 04/19/2022   LFT Recent Labs    04/19/22 0116 04/19/22 1710 04/20/22 0130  ALBUMIN 2.0* 1.9* 2.1*   PT/INR Lab Results  Component Value Date   INR 1.0 07/02/2021   INR 1.0 02/03/2021   INR 1.03 03/26/2018   Hepatitis Panel No results for input(s): "HEPBSAG", "HCVAB", "HEPAIGM", "HEPBIGM" in the last 72 hours. C-Diff No components found for: "CDIFF" Lipase     Component Value Date/Time   LIPASE 128 (H) 04/13/2022 0456    Drugs of Abuse     Component Value Date/Time   LABOPIA POSITIVE (A) 04/10/2022 0730   COCAINSCRNUR NONE DETECTED 04/10/2022 0730   COCAINSCRNUR NEG 03/21/2015 0930   LABBENZ NONE DETECTED 04/10/2022 0730   LABBENZ NEG 03/21/2015 0930   AMPHETMU NONE DETECTED 04/10/2022 0730   THCU NONE DETECTED 04/10/2022 0730   LABBARB NONE DETECTED 04/10/2022 0730     RADIOLOGY STUDIES: CT CHEST ABDOMEN PELVIS W CONTRAST  Result Date: 04/19/2022 CLINICAL DATA:  Sepsis, evaluate for source of infection. Pancreatitis EXAM: CT CHEST, ABDOMEN, AND PELVIS WITH CONTRAST TECHNIQUE: Multidetector CT imaging of the chest, abdomen and pelvis was performed following the standard protocol during bolus administration of intravenous contrast. RADIATION DOSE REDUCTION: This exam was performed according to the departmental dose-optimization program which includes automated exposure control, adjustment of the mA and/or kV  according to patient size and/or use of iterative reconstruction technique. CONTRAST:  1104m OMNIPAQUE IOHEXOL 300 MG/ML  SOLN COMPARISON:  CT 04/10/2022 FINDINGS: CT CHEST FINDINGS Cardiovascular: Central venous line with tip in the distal SVC. No pericardial effusion. No acute cardio vascular findings Mediastinum/Nodes: No mediastinal adenopathy. Trachea normal. Mild thickening of the esophagus. Feeding tube extends through the esophagus into the stomach. Lungs/Pleura: Mild basilar atelectasis at the RIGHT base. Cavitary lesion in the anterior LEFT upper lobe measuring 7.2 cm by 3.4 cm. This cavitary lesion is thick-walled similar to CT 03/12/2022. Musculoskeletal: No aggressive osseous lesion. CT ABDOMEN AND PELVIS FINDINGS Hepatobiliary: Postcholecystectomy. No intrahepatic biliary duct dilatation. Enhancing lesion in the liver. Pancreas: Again demonstrated extensive fluid along the body and tail the pancreas. The pancreas is edematous. Pancreas does enhance uniformly. There is no evidence of pancreatic ductal interruption. No organized fluid collections. Degree of pancreatic inflammation and fluid is increased from 04/10/2022. No vascular complication identified associated with the acute pancreatitis. Spleen: Normal spleen Adrenals/urinary tract: Adrenal glands and kidneys are normal. The ureters  and bladder normal. Stomach/Bowel: Feeding tube extends into the fourth portion the duodenum. No small bowel obstruction. No pneumatosis or portal venous gas. Colon is normal. Vascular/Lymphatic: Abdominal aorta is normal caliber. There is no retroperitoneal or periportal lymphadenopathy. No pelvic lymphadenopathy. Reproductive: Unremarkable. Extensive streak artifact from the bowel hip prosthetics Other: No abdominal abscess Musculoskeletal: No aggressive osseous lesion. IMPRESSION: Chest Impression: 1. Mild RIGHT basilar atelectasis is new from comparison CT. 2. Stable thick-walled cavitary lesion in the LEFT upper  lobe. 3. Mild thickening of the esophagus. Abdomen / Pelvis Impression: 1. Increase in fluid surrounding the pancreas compared to most recent CT. Findings consistent with acute pancreatitis. No organized fluid collections at this point. Pancreas is edematous but no evidence of necrotizing pancreatitis as the pancreas enhances. 2. Feeding tube extends in the fourth portion duodenum. 3. No evidence of bowel obstruction or bowel ischemia. Electronically Signed   By: Suzy Bouchard M.D.   On: 04/19/2022 14:46   CT HEAD WO CONTRAST (5MM)  Result Date: 04/19/2022 CLINICAL DATA:  Altered mental status. EXAM: CT HEAD WITHOUT CONTRAST TECHNIQUE: Contiguous axial images were obtained from the base of the skull through the vertex without intravenous contrast. RADIATION DOSE REDUCTION: This exam was performed according to the departmental dose-optimization program which includes automated exposure control, adjustment of the mA and/or kV according to patient size and/or use of iterative reconstruction technique. COMPARISON:  None Available. FINDINGS: Brain: No evidence of acute infarction, hemorrhage, hydrocephalus, extra-axial collection or mass lesion/mass effect. Vascular: No hyperdense vessel or unexpected calcification. Skull: Normal. Negative for fracture or focal lesion. Sinuses/Orbits: No acute finding. Other: None. IMPRESSION: No acute intracranial abnormality seen. Electronically Signed   By: Marijo Conception M.D.   On: 04/19/2022 14:34      IMPRESSION:   Acute pancreatitis secondary to hypertriglyceridemia.  Latest imaging shows ongoing pancreatitis but no evidence for necrosis or pseudocyst.   LFTs normal.  Lipase down trending    New dilation CBD over 9 days.     Lap chole, liver bx 7/18/223.  Liver pathology showed chronic hepatitis with minimal grade 1 activity, stage III bridging fibrosis consistent with history hep C, mild macrovascular steatosis.  Current imaging studies confirm fatty liver but  no cirrhosis.  LFTs normal   Hiccups.      HCV treated with [name] with SVR.    Adenomatous colon polyps at colonoscopy 06/2021    Dilation of Schatskis ring, Gastritis, no H pylori at EGD 06/2021.  Food impaction at EGD 04/2018.    Normocytic anemia.  CKD.  AKI.  Requiring CRRT since 8/8.  Progressive oliguria, only 100 mL urine recorded 2 days ago, nothing since.  Tachycardia.  Hypotension.   PLAN:       Per Dr Tarri Glenn.    ? MRCP though not clear he can cooperate for this.     Azucena Freed  04/20/2022, 10:28 AM Phone [phone]

## 2022-04-20 NOTE — Progress Notes (Signed)
NAME:  Mathew Lara, MRN:  [id number], DOB:  17-Jun-1961, LOS: 28 ADMISSION DATE:  04/10/2022, CONSULTATION DATE:  8/5 REFERRING MD:  Darrell Jewel, CHIEF COMPLAINT: Abdominal pain  History of Present Illness:  PURNELL Lara is an 61 y.o. M who presented to [facility] on 8/5 with a chief complaint of abdominal pain.  They have a past medical history of alcohol abuse, anxiety, CKD 3, COPD, OSA, type 2 diabetes, hypertension, hyperlipidemia, hep C  ED work-up revealed DKA, acute pancreatitis, AKI.  He was admitted to the hospital service.  Overnight he developed continuing respiratory distress, shock.   PCCM was consulted for transfer to [facility] for further management.  Still requiring pressors, abdominal pain is improved, respiratory status also appears better  Pertinent  Medical History  Alcohol abuse, anxiety, CKD 3, COPD, OSA, type 2 diabetes, hypertension, hyperlipidemia, hep C  Significant Hospital Events: Including procedures, antibiotic start and stop dates in addition to other pertinent events   8/5 presented to [facility] ED, transferred [facility] 8/7 on pressors  8/8 on BiPAP, worsening renal parameters 8/9 tolerating CRRT, on BiPAP 8/10 has been off BiPAP, on high flow oxygen and tolerating 8/11 UOP improving. CRRT with net even UF 8/14 CT reveals worsening cavitary pneumonia and acute pancreatitis, large BM 8/15 abx changed for cavitary pneumonia  Interim History / Subjective:   As above  Objective   Blood pressure 108/89, pulse (!) 113, temperature 97.9 F (36.6 C), temperature source Oral, resp. rate (!) 23, height '5\' 8"'$  (1.727 m), weight 129.1 kg, SpO2 98 %.    FiO2 (%):  [60 %] 60 %   Intake/Output Summary (Last 24 hours) at 04/20/2022 0813 Last data filed at 04/20/2022 0700 Gross per 24 hour  Intake 2479.97 ml  Output 3018 ml  Net -538.03 ml    Filed Weights   04/17/22 0500 04/18/22 0500 04/20/22 0500  Weight: 133.8 kg 131.8 kg 129.1 kg    Physical Exam: General: chronically ill-appearing, uncomfortable appearing with frequent hiccups HEENT: [place] in place on FiO2 60% Eyes: EOMI, no scleral icterus Respiratory: diminished breath sounds bilaterally, no crackles or wheezing CV: RRR, no murmurs auscultated GI: hypoactive BS, distended, no tenderness Extremities: no edema  Labs/imaging  Cr worsening 2.47>2.81 WBC increasing 19>23.6  CT abdomen pelvis concerning for acute pancreatitis, no biliary dilation, status postcholecystectomy CT chest with cavitary pneumonia Right upper quadrant ultrasound with hepatic steatosis Chest x-ray: Opacity of left lung, small right pleural effusion, no pneumothorax, no obvious infiltrate  Resolved Hospital Problem list     Assessment & Plan:  Acute metabolic encephalopathy - improves with BiPAP, suspect hypoactive delirium. CT head unremarkable.  -Re-orient, supportive management -Management as noted below  Underlying chronic obstructive pulmonary disease Underlying obstructive sleep apnea Leukocytosis Cavitary Pneumonia Afebrile. HDS. Bcx 8/10 NGTD. CXR 8/11 no acute findings. KUB 8/12 unremarkable. Cavitary PNA on CT chest unresolved despite abx. No evidence of ILD. Likely will need longterm abx for resolution of cavitary PNA.  -Wean high flow oxygen supplementation -Will have BiPAP PRN and nightly as tolerated -Brovana, Pulmicort and PRN albuterol -monitor Bcx, sputum culture -monitor CBC -abx transition to Cefepime q12h, Flagyl q12h, Vancomycin, Azithromycin (8/15-8/20)  Acute kidney injury on chronic kidney disease 2/2 ATN in setting of acute pancreatitis and shock Remains on CRRT which was started on 04/13/22. UOP poor in last 24 hours cith worsening Cr.  -Appreciate renal follow-up. Continue CRRT. -Continue to maintain renal perfusion -Avoid nephrotoxic medications  Type  2 diabetes DKA - resolved -Semglee 18U BID -TF coverage: Novolog 4u q4h  Acute  pancreatitis Worsened on CT abdomen from 8/15. -GI consulted, appreciate recommendations -Baclofen for hiccups -Trickle tubefeeds -Lipase normalized -Trend triglycerides every 3 days: improving slowly, currently 321  Multifactorial shock Off pressors 8/11 -Continue midodrine '10mg'$  TID -Pain management: oxycodone '5mg'$  q6h PRN, Tylenol '650mg'$  q6h PRN  History of diastolic heart failure -Echo 12/25/2019 revealed ejection fraction of 60 to 65%  History of hepatitis C with advanced liver fibrosis -Followed up with Dr. Abbey Chatters with [facility] gastroenterology -Continue close monitoring  Constipation 2 stools of unmeasured output in last 24 hours after receiving methylnaltrexone. -Bowel regimen: Mylicon '80mg'$  4x/day  Best Practice (right click and "Reselect all SmartList Selections" daily)   Diet/type: tubefeeds and clear liquids DVT prophylaxis: prophylactic heparin  GI prophylaxis: N/A Lines: N/A Foley:  Yes, and it is still needed Code Status:  full code Last date of multidisciplinary goals of care discussion [pending] Updated daughter on telephone 8/14  Wells Guiles, DO 04/20/2022, 8:13 AM PGY-2, [place]

## 2022-04-21 ENCOUNTER — Inpatient Hospital Stay (HOSPITAL_COMMUNITY): Payer: Medicare Other

## 2022-04-21 DIAGNOSIS — J984 Other disorders of lung: Secondary | ICD-10-CM

## 2022-04-21 DIAGNOSIS — K851 Biliary acute pancreatitis without necrosis or infection: Secondary | ICD-10-CM | POA: Diagnosis not present

## 2022-04-21 DIAGNOSIS — D72829 Elevated white blood cell count, unspecified: Secondary | ICD-10-CM

## 2022-04-21 DIAGNOSIS — J189 Pneumonia, unspecified organism: Secondary | ICD-10-CM | POA: Diagnosis not present

## 2022-04-21 DIAGNOSIS — N179 Acute kidney failure, unspecified: Secondary | ICD-10-CM | POA: Diagnosis not present

## 2022-04-21 DIAGNOSIS — K852 Alcohol induced acute pancreatitis without necrosis or infection: Secondary | ICD-10-CM

## 2022-04-21 DIAGNOSIS — K859 Acute pancreatitis without necrosis or infection, unspecified: Secondary | ICD-10-CM | POA: Diagnosis not present

## 2022-04-21 DIAGNOSIS — K839 Disease of biliary tract, unspecified: Secondary | ICD-10-CM | POA: Diagnosis not present

## 2022-04-21 DIAGNOSIS — J9601 Acute respiratory failure with hypoxia: Secondary | ICD-10-CM | POA: Diagnosis not present

## 2022-04-21 LAB — GLUCOSE, CAPILLARY
Glucose-Capillary: 227 mg/dL — ABNORMAL HIGH (ref 70–99)
Glucose-Capillary: 181 mg/dL — ABNORMAL HIGH (ref 70–99)
Glucose-Capillary: 179 mg/dL — ABNORMAL HIGH (ref 70–99)
Glucose-Capillary: 197 mg/dL — ABNORMAL HIGH (ref 70–99)
Glucose-Capillary: 248 mg/dL — ABNORMAL HIGH (ref 70–99)
Glucose-Capillary: 255 mg/dL — ABNORMAL HIGH (ref 70–99)

## 2022-04-21 LAB — CBC WITH DIFFERENTIAL/PLATELET
Abs Immature Granulocytes: 1.4 10*3/uL — ABNORMAL HIGH (ref 0.00–0.07)
Basophils Absolute: 0.1 10*3/uL (ref 0.0–0.1)
Basophils Relative: 1 %
Eosinophils Absolute: 0.3 10*3/uL (ref 0.0–0.5)
Eosinophils Relative: 1 %
HCT: 33.7 % — ABNORMAL LOW (ref 39.0–52.0)
Hemoglobin: 10.4 g/dL — ABNORMAL LOW (ref 13.0–17.0)
Immature Granulocytes: 6 %
Lymphocytes Relative: 5 %
Lymphs Abs: 1.4 10*3/uL (ref 0.7–4.0)
MCH: 26.4 pg (ref 26.0–34.0)
MCHC: 30.9 g/dL (ref 30.0–36.0)
MCV: 85.5 fL (ref 80.0–100.0)
Monocytes Absolute: 1.4 10*3/uL — ABNORMAL HIGH (ref 0.1–1.0)
Monocytes Relative: 6 %
Neutro Abs: 21 10*3/uL — ABNORMAL HIGH (ref 1.7–7.7)
Neutrophils Relative %: 81 %
Platelets: 342 10*3/uL (ref 150–400)
RBC: 3.94 MIL/uL — ABNORMAL LOW (ref 4.22–5.81)
RDW: 16.4 % — ABNORMAL HIGH (ref 11.5–15.5)
WBC Morphology: INCREASED
WBC: 25.7 10*3/uL — ABNORMAL HIGH (ref 4.0–10.5)
nRBC: 0.1 % (ref 0.0–0.2)

## 2022-04-21 LAB — CBC
HCT: 33.6 % — ABNORMAL LOW (ref 39.0–52.0)
Hemoglobin: 10.2 g/dL — ABNORMAL LOW (ref 13.0–17.0)
MCH: 26.3 pg (ref 26.0–34.0)
MCHC: 30.4 g/dL (ref 30.0–36.0)
MCV: 86.6 fL (ref 80.0–100.0)
Platelets: 347 10*3/uL (ref 150–400)
RBC: 3.88 MIL/uL — ABNORMAL LOW (ref 4.22–5.81)
RDW: 16.4 % — ABNORMAL HIGH (ref 11.5–15.5)
WBC: 25.6 10*3/uL — ABNORMAL HIGH (ref 4.0–10.5)
nRBC: 0.1 % (ref 0.0–0.2)

## 2022-04-21 LAB — RENAL FUNCTION PANEL
Albumin: 2.1 g/dL — ABNORMAL LOW (ref 3.5–5.0)
Albumin: 2.1 g/dL — ABNORMAL LOW (ref 3.5–5.0)
Anion gap: 13 (ref 5–15)
Anion gap: 13 (ref 5–15)
BUN: 33 mg/dL — ABNORMAL HIGH (ref 8–23)
BUN: 35 mg/dL — ABNORMAL HIGH (ref 8–23)
CO2: 24 mmol/L (ref 22–32)
CO2: 23 mmol/L (ref 22–32)
Calcium: 9.6 mg/dL (ref 8.9–10.3)
Calcium: 9.6 mg/dL (ref 8.9–10.3)
Chloride: 102 mmol/L (ref 98–111)
Chloride: 103 mmol/L (ref 98–111)
Creatinine, Ser: 2.97 mg/dL — ABNORMAL HIGH (ref 0.61–1.24)
Creatinine, Ser: 3 mg/dL — ABNORMAL HIGH (ref 0.61–1.24)
GFR, Estimated: 23 mL/min — ABNORMAL LOW (ref >60)
GFR, Estimated: 23 mL/min — ABNORMAL LOW (ref >60)
Glucose, Bld: 211 mg/dL — ABNORMAL HIGH (ref 70–99)
Glucose, Bld: 234 mg/dL — ABNORMAL HIGH (ref 70–99)
Phosphorus: 2.7 mg/dL (ref 2.5–4.6)
Phosphorus: 2.7 mg/dL (ref 2.5–4.6)
Potassium: 3.7 mmol/L (ref 3.5–5.1)
Potassium: 3.6 mmol/L (ref 3.5–5.1)
Sodium: 139 mmol/L (ref 135–145)
Sodium: 139 mmol/L (ref 135–145)

## 2022-04-21 LAB — TRIGLYCERIDES: Triglycerides: 402 mg/dL — ABNORMAL HIGH (ref <150)

## 2022-04-21 LAB — MAGNESIUM: Magnesium: 2.7 mg/dL — ABNORMAL HIGH (ref 1.7–2.4)

## 2022-04-21 MED ORDER — METOCLOPRAMIDE HCL 5 MG/ML IJ SOLN: 5.0000 mg | Freq: Four times a day (QID) | INTRAMUSCULAR | Status: DC

## 2022-04-21 MED ORDER — PHENYLEPHRINE CONCENTRATED 100MG/250ML (0.4 MG/ML) INFUSION SIMPLE
0.0000 ug/min | INTRAVENOUS | Status: DC
  Filled 2022-04-21: qty 250

## 2022-04-21 MED ORDER — GABAPENTIN 100 MG PO CAPS
100.0000 mg | ORAL_CAPSULE | Freq: Three times a day (TID) | ORAL | Status: DC | PRN
  Administered 2022-04-23 – 2022-04-27 (×12): 100 mg via ORAL
  Filled 2022-04-21 (×12): qty 1

## 2022-04-21 MED ORDER — PHENYLEPHRINE HCL-NACL 20-0.9 MG/250ML-% IV SOLN: 0.0000 ug/min | INTRAVENOUS | Status: DC

## 2022-04-21 MED ORDER — INSULIN GLARGINE-YFGN 100 UNIT/ML ~~LOC~~ SOLN
20.0000 [IU] | Freq: Two times a day (BID) | SUBCUTANEOUS | Status: DC
  Administered 2022-04-21 – 2022-04-23 (×5): 20 [IU] via SUBCUTANEOUS
  Filled 2022-04-21 (×6): qty 0.2

## 2022-04-21 MED ORDER — INSULIN ASPART 100 UNIT/ML IJ SOLN
5.0000 [IU] | INTRAMUSCULAR | Status: DC
  Administered 2022-04-21 – 2022-04-22 (×5): 5 [IU] via SUBCUTANEOUS

## 2022-04-21 MED ORDER — AMOXICILLIN-POT CLAVULANATE 500-125 MG PO TABS
1.0000 | ORAL_TABLET | Freq: Two times a day (BID) | ORAL | Status: DC
  Administered 2022-04-22: 500 mg via ORAL
  Filled 2022-04-21: qty 1

## 2022-04-21 MED ORDER — AMOXICILLIN-POT CLAVULANATE 875-125 MG PO TABS
1.0000 | ORAL_TABLET | Freq: Once | ORAL | Status: AC
  Administered 2022-04-21: 1 via ORAL
  Filled 2022-04-21: qty 1

## 2022-04-21 MED ORDER — OXYCODONE HCL 5 MG PO TABS
10.0000 mg | ORAL_TABLET | Freq: Four times a day (QID) | ORAL | Status: DC | PRN
  Administered 2022-04-22 – 2022-04-25 (×7): 10 mg
  Filled 2022-04-21 (×7): qty 2

## 2022-04-21 MED ORDER — VITAL 1.5 CAL PO LIQD
1000.0000 mL | ORAL | Status: DC
  Administered 2022-04-21: 1000 mL
  Filled 2022-04-21: qty 1000

## 2022-04-21 MED ORDER — MIDODRINE HCL 5 MG PO TABS
15.0000 mg | ORAL_TABLET | Freq: Three times a day (TID) | ORAL | Status: DC
  Administered 2022-04-21 – 2022-04-29 (×24): 15 mg
  Filled 2022-04-21 (×24): qty 3

## 2022-04-21 MED ORDER — BACLOFEN 5 MG HALF TABLET
5.0000 mg | ORAL_TABLET | Freq: Three times a day (TID) | ORAL | Status: DC
  Administered 2022-04-21: 5 mg

## 2022-04-21 NOTE — Progress Notes (Signed)
[facility] Progress Note   Assessment/ Plan:   AKI/CKD stage IIIb-IV - presumably ischemic ATN in setting of acute pancreatitis, hypotension, pressor support, with concomitant ARB, SGLT-2 inhibitor and finerenone.  Initially non-oliguric but now UOP has decreased since being placed on pressors.  Started on CRRT 04/13/22.  RIJ temp HD cath placed 04/13/22.  Continue to hold losartan, Jardiance, and [place].   Keep even- especially in setting of persistent hypotension. ? If hypotension soley d/t shock or if CRRT has some hemodynamic impact- d/w PCCM- will stop CRRT 1700 today, observe pressures, make a determination to start again in the AM  Keep MAP >65 Renal dose meds for eGFR <15 mL/min.   Avoid nephrotoxic agents such as IV contrast, NSAIDs, and phosphate containing bowel preps (FLEETS).   Preferred narcotics are hydromorphone, fentanyl, and methadone.  Morphine should be avoided. Acute pancreatitis - due to hypertriglyceridemia.  DKA- resolved  Shock - likely due to pancreatitis + cavitary pneumonia -   Repeat CT abd pelvis 8/14 with edematous pancreas but no evidence of necrotizing pancreatitis.   - on vanc/ cefepime/ flagyl/ azithro - on midodrine 15 TID Acute hypoxic respiratory failure - with underlying COPD and OSA.  Per PCCM Hep C with advanced liver fibrosis Chronic diastolic CHF - stable.  Subjective:    Seen in room.  Continues to remain on CRRT.  BP soft, tachycardic.  Overall looks a little better with brighter affect.     Objective:   BP 101/69   Pulse (!) 112   Temp 97.7 F (36.5 C) (Oral)   Resp (!) 27   Ht 5' 8" (1.727 m)   Wt 125.9 kg   SpO2 95%   BMI 42.20 kg/m   Physical Exam: JOI:NOMVEHM ill, lying on side in bed, moaning CVS: tachycardic Resp: tachypneic Abd: soft, obese, diffusely tender Ext: no LE edema ACCESS: R IJ nontunneled HD cath  Labs: BMET Recent Labs  Lab 04/18/22 0702 04/18/22 1652 04/18/22 1956 04/19/22 0116  04/19/22 1710 04/20/22 0130 04/20/22 1627 04/21/22 0559  NA 140 138 138 137 133* 134* 137 139  K 3.7 3.8 3.9 3.8 3.7 3.8 4.8 3.7  CL 105 101  --  101 100 100 101 102  CO2 23 25  --  _0 21* 24  GLUCOSE 212* 217*  --  169* 271* 168* 174* 211*  BUN 30* 32*  --  32* 32* 30* 31* 33*  CREATININE 1.92* 2.08*  --  2.47* 2.86* 2.81* 2.97* 2.97*  CALCIUM 9.0 9.4  --  9.2 8.6* 9.3 9.2 9.6  PHOS 2.5 2.3*  --  2.4* 3.1 3.5 3.4 2.7   CBC Recent Labs  Lab 04/15/22 0351 04/16/22 0520 04/17/22 0053 04/18/22 0702 04/18/22 1956 04/19/22 0116 04/20/22 0130 04/21/22 0559  WBC 13.4* 14.7*   < > 19.8*  --  19.8* 23.6* 25.6*  NEUTROABS 11.0* 12.1*  --   --   --   --   --   --   HGB 10.4* 10.0*   < > 10.3* 11.6* 10.4* 10.2* 10.2*  HCT 34.2* 32.5*   < > 34.1* 34.0* 34.0* 34.4* 33.6*  MCV 87.5 86.7   < > 87.4  --  88.3 89.1 86.6  PLT 204 200   < > 218  --  242 288 347   < > = values in this interval not displayed.      Medications:     arformoterol  15 mcg Nebulization BID  B-complex with vitamin C  1 tablet Per Tube Daily   budesonide (PULMICORT) nebulizer solution  0.5 mg Nebulization BID   Chlorhexidine Gluconate Cloth  6 each Topical Daily   feeding supplement  237 mL Oral TID BM   feeding supplement (PROSource TF20)  60 mL Per Tube TID   heparin  5,000 Units Subcutaneous Q8H   insulin aspart  0-20 Units Subcutaneous Q4H   insulin aspart  5 Units Subcutaneous Q4H   insulin glargine-yfgn  20 Units Subcutaneous BID   midodrine  15 mg Per Tube Q8H   mupirocin ointment  1 Application Nasal BID   mouth rinse  15 mL Mouth Rinse 4 times per day   simethicone  80 mg Per Tube QID   sodium chloride flush  10-40 mL Intracatheter Q12H     Madelon Lips MD 04/21/2022, 11:00 AM

## 2022-04-21 NOTE — Progress Notes (Addendum)
NAME:  Mathew Lara, MRN:  [id number], DOB:  1961/05/20, LOS: 28 ADMISSION DATE:  04/10/2022, CONSULTATION DATE:  8/5 REFERRING MD:  Darrell Jewel, CHIEF COMPLAINT: Abdominal pain  History of Present Illness:  Mathew Lara is an 61 y.o. M who presented to [facility] on 8/5 with a chief complaint of abdominal pain.  They have a past medical history of alcohol abuse, anxiety, CKD 3, COPD, OSA, type 2 diabetes, hypertension, hyperlipidemia, hep C  ED work-up revealed DKA, acute pancreatitis, AKI.  He was admitted to the hospital service.  Overnight he developed continuing respiratory distress, shock.   PCCM was consulted for transfer to [facility] for further management.  Still requiring pressors, abdominal pain is improved, respiratory status also appears better  Pertinent  Medical History  Alcohol abuse, anxiety, CKD 3, COPD, OSA, type 2 diabetes, hypertension, hyperlipidemia, hep C  Significant Hospital Events: Including procedures, antibiotic start and stop dates in addition to other pertinent events   8/5 presented to [facility] ED, transferred [facility] 8/7 on pressors  8/8 on BiPAP, worsening renal parameters 8/9 tolerating CRRT, on BiPAP 8/10 has been off BiPAP, on high flow oxygen and tolerating 8/11 UOP improving. CRRT with net even UF 8/14 CT reveals worsening cavitary pneumonia and acute pancreatitis, large BM 8/15 abx changed for cavitary pneumonia  Interim History / Subjective:   Intermittently on pressors overnight. Weaned off this am BM yesterday  Objective   Blood pressure 109/72, pulse (!) 113, temperature 98.1 F (36.7 C), temperature source Oral, resp. rate (!) 29, height '5\' 8"'$  (1.727 m), weight 125.9 kg, SpO2 97 %.    Vent Mode: PCV;BIPAP FiO2 (%):  [50 %-60 %] 60 % Set Rate:  [14 bmp] 14 bmp PEEP:  [6 cmH20] 6 cmH20   Intake/Output Summary (Last 24 hours) at 04/21/2022 0739 Last data filed at 04/21/2022 0700 Gross per 24 hour  Intake  2194.89 ml  Output 3455 ml  Net -1260.11 ml   Filed Weights   04/18/22 0500 04/20/22 0500 04/21/22 0315  Weight: 131.8 kg 129.1 kg 125.9 kg   Physical Exam: General: Chronically ill-appearing, awake HENT: [place], AT, OP clear, MMM Eyes: EOMI, no scleral icterus Respiratory: =Diminished but clear to auscultation bilaterally.  No crackles, wheezing or rales Cardiovascular: RRR, -M/R/G, no JVD GI: Hypoactive BS, firm but improved, mild tenderness to palpation but no rebound tenderness Extremities:-Edema,-tenderness Neuro: AAO x2, CNII-XII grossly intact, follows commands GU: Foley in place  Labs/imaging  Cr increasing 2.47>2.81>2.97 WBC increasing 25.6 Bcx 8/14 Sputum Cx 8/10 Neg  Resolved Hospital Problem list     Assessment & Plan:  Acute metabolic encephalopathy - improves with BiPAP, suspect hypercarbic failure +/- hypoactive delirium and sepsis. CT head neg -Re-orient, supportive management -Management as noted below  LUL Cavitary pneumonia Paraseptal emphysema Underlying obstructive sleep apnea Hx post-infectious scarring - no ILD on CT Bcx 8/10 NGTD. CXR 8/11 no acute findings. KUB 8/12 unremarkable. Cavitary PNA on CT chest never treated.  -Wean high flow oxygen supplementation for goal SpO2 >88% -Will have BiPAP PRN and nightly as tolerated -Continue nebs: Brovana, Pulmicort and PRN albuterol -Continue Vanc/Cefepime/Flagyl/Azithro (8/15-8/20) -If not clinically improving may need bronchoscopy to rule out atypical or fungal infection   Multifactorial shock Intermittently requiring pressors, worsening leukocytosis -Increase midodrine '15mg'$  TID -Management as above with antibiotics and cultures -Consult ID for additional work-up if needed  Acute kidney injury on chronic kidney disease 2/2 ATN in setting of acute pancreatitis and shock  Remains on CRRT which was started on 04/13/22.  -Appreciate renal follow-up. Continue CRRT for net event. 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 Abbey Chatters with [facility] gastroenterology RUQ [place] 04/10/22 - CBD dilatation 10.8 mm Lipase normalized, improving TG -GI consulted, appreciate recommendations. MRCP when able -Trickle tubefeeds -Pain management: increase oxycodone '10mg'$  q6h PRN, Tylenol '650mg'$  q6h PRN  Type 2 diabetes DKA - resolved -Semglee 20U BID -TF coverage: Novolog 5u q4h -SSI  History of diastolic heart failure -Echo 12/25/2019 revealed ejection fraction of 60 to 65%  Constipation - improving -S/p SMOG enema 8/14 -S/p methylnaltrexone 8/14 -Minimize narcotic intake -Bowel regimen -Reglan, simethicone -Trickle feeds  Hiccups -Baclofen  Best Practice (right click and "Reselect all SmartList Selections" daily)   Diet/type: tubefeeds and clear liquids DVT prophylaxis: prophylactic heparin  GI prophylaxis: N/A Lines: N/A Foley:  Yes, and it is still needed Code Status:  full code Last date of multidisciplinary goals of care discussion [pending] Updated daughter on 04/21/22 on plan above  The patient is critically ill with multiple organ systems failure and requires high complexity decision making for assessment and support, frequent evaluation and titration of therapies, application of advanced monitoring technologies and extensive interpretation of multiple databases.  Independent Critical Care Time: 60 Minutes.   Rodman Pickle, M.D. [facility] Pulmonary/Critical Care Medicine 04/21/2022 8:29 AM   Please see Amion for pager number to reach on-call Pulmonary and Critical Care Team.

## 2022-04-21 NOTE — Progress Notes (Signed)
[facility] Progress Note Patient Name: Mathew Lara DOB: [birth year] MRN: [id number]   Date of Service  04/21/2022  HPI/Events of Note  Patient with soft blood pressure on CRRT.  eICU Interventions  Phenylephrine gtt ordered to keep MAP > 65 mmHg.        Mathew Lara 04/21/2022, 2:27 AM

## 2022-04-21 NOTE — Progress Notes (Addendum)
Nutrition Follow-up  DOCUMENTATION CODES:   Not applicable  INTERVENTION:   Continue trickle tube feeding via Cortrak: Change to semi-elemental formula: Vital 1.5 at 20 ml/h. When tolerating without abdominal distention, recommend increase slowly to goal rate of 60 ml/h (1440 ml per day) Prosource TF20 60 ml BID (hold for now) At goal rate, TF regimen provides 2320 kcal, 137 gm protein, 1100 ml free water daily.  D/C B-complex with vitamin C since CRRT being discontinued. When patient is tolerating enteral nutrition, will resume a renal MVI.  NUTRITION DIAGNOSIS:   Increased nutrient needs related to acute illness (renal failure requiring CRRT) as evidenced by estimated needs.  GOAL:   Patient will meet greater than or equal to 90% of their needs  MONITOR:   PO intake, Supplement acceptance, Labs, I & O's  REASON FOR ASSESSMENT:   Rounds (Poor PO on CRRT)    ASSESSMENT:   61 yo male admitted with DKA, acute pancreatitis, respiratory distress, shock. PMH includes alcohol abuse, anxiety, CKD 3, COPD, DM-2, HTN, HLD, hepatitis C.  Discussed patient in ICU rounds and with RN today. Plans to stop CRRT today around 5pm. Patient has been on trickle tube feedings of Osmolite 1.5 at 20 ml/h since 8/11. Abdominal pain and distention is ongoing and have been unable to advance TF to goal rate. Patient with dilated CBD and pancreatitis. GI following. Acute pancreatitis originally attributed to elevated triglycerides, now possible biliary etiology. Currently not a candidate for MRCP. TF is infusing via Cortrak with tip at the LOT, should not be attributing to GI issues. Could trial semi-elemental formula for possible improved tolerance. Currently on clear liquid diet, not eating/drinking anything.  Labs reviewed.  CBG: [phone]  Medications reviewed and include B-complex with vitamin C, Novolog, Semglee, Mylicon.  Net IO Since Admission: 232.27 mL [04/21/22 1623]  Intake/Output  Summary (Last 24 hours) at 04/21/2022 1623 Last data filed at 04/21/2022 1600 Gross per 24 hour  Intake 1831.52 ml  Output 2960 ml  Net -1128.48 ml   UOP 150 ml x 24 hours. Weight down to 125.9 kg today with ongoing fluid removal from CRRT. Admission weight 134.5 kg.  Diet Order:   Diet Order             Diet clear liquid Room service appropriate? Yes; Fluid consistency: Thin  Diet effective now                   EDUCATION NEEDS:   Not appropriate for education at this time  Skin:  Skin Assessment: Reviewed RN Assessment  Last BM:  8/16 type 6  Height:   Ht Readings from Last 1 Encounters:  04/10/22 '5\' 8"'$  (1.727 m)    Weight:   Wt Readings from Last 1 Encounters:  04/21/22 125.9 kg    Ideal Body Weight:  70 kg  BMI:  Body mass index is 42.2 kg/m.  Estimated Nutritional Needs:   Kcal:  2300-2500  Protein:  105-140 gm  Fluid:  1 L + UOP    Lucas Mallow RD, LDN, CNSC Please refer to Amion for contact information.

## 2022-04-21 NOTE — TOC Progression Note (Signed)
Transition of Care [facility]) - Progression Note    Patient Details  Name: Mathew Lara MRN: [id number] Date of Birth: 01/29/1961  [facility] CM/SW Contact  Tom-Johnson, Renea Ee, RN Phone Number: 04/21/2022, 2:32 PM  Clinical Narrative:     Patient is on CRRT and plan to stop today at 5 pm. Has a RIJ temp HD cath. CT on 04/19/22 shows worsening Cavitary Pneumonia and Acute Pancreatitis, on IV abx.  No TOC needs noted at this time. CM will continue to follow with needs as patient progresses with care.    Barriers to Discharge: Continued Medical Work up  Expected Discharge Plan and Services                                                 Social Determinants of Health (SDOH) Interventions    Readmission Risk Interventions     No data to display

## 2022-04-21 NOTE — Inpatient Diabetes Management (Signed)
Inpatient Diabetes Program Recommendations  AACE/ADA: New Consensus Statement on Inpatient Glycemic Control   Target Ranges:  Prepandial:   less than 140 mg/dL      [place] postprandial:   less than 180 mg/dL (1-2 hours)      Critically ill patients:  140 - 180 mg/dL    Latest Reference Range & Units 04/20/22 07:19 04/20/22 11:14 04/20/22 15:09 04/20/22 19:50 04/20/22 23:13 04/21/22 03:11  Glucose-Capillary 70 - 99 mg/dL 151 (H) 170 (H) 164 (H) 231 (H) 218 (H) 227 (H)   Review of Glycemic Control  Diabetes history: DM2 Outpatient Diabetes medications: Jardiance 10 mg daily Current orders for Inpatient glycemic control: Semglee 18 units BID, Novolog 0-20 units Q4H, Novolog 4 units Q4H for tube feeding coverage; Osmolite @ 20 ml/hr   Inpatient Diabetes Program Recommendations:     Insulin: Please consider increasing Semglee to 20 units BID and tube feeding coverage to Novolog 5 units Q4H.   Thanks, Barnie Alderman, RN, MSN, [place] Diabetes Coordinator Inpatient Diabetes Program [phone] (Team Pager from 8am to [place])

## 2022-04-21 NOTE — Progress Notes (Signed)
Daily Rounding Note  04/21/2022, 12:12 PM  LOS: 11 days   SUBJECTIVE:   Chief complaint:   dilated CBD.  Pancreatitis.      OBJECTIVE:         Vital signs in last 24 hours:    Temp:  [97.7 F (36.5 C)-98.6 F (37 C)] 98.6 F (37 C) (08/16 1200) Pulse Rate:  [104-127] 113 (08/16 1200) Resp:  [12-43] 25 (08/16 1200) BP: (70-130)/(30-105) 92/64 (08/16 1200) SpO2:  [85 %-100 %] 93 % (08/16 1200) FiO2 (%):  [50 %-60 %] 60 % (08/16 0819) Weight:  [125.9 kg] 125.9 kg (08/16 0315) Last BM Date : 04/19/22 Filed Weights   04/18/22 0500 04/20/22 0500 04/21/22 0315  Weight: 131.8 kg 129.1 kg 125.9 kg   General: More alert today.  Able to respond appropriately.  Appears comfortable lying on the bed. Heart: RRR. Chest: No labored breathing or cough. Abdomen: Obese, soft, slight tenderness in epigastrium. Extremities: + LE edema. Neuro/Psych: Although he is more alert today he still has significant impairment.  Not consistently following commands.  Not verbal  Intake/Output from previous day: 08/15 0701 - 08/16 0700 In: 2194.9 [P.O.:150; I.V.:106.9; NG/GT:767; IV Piggyback:1171] Out: 5366 [Urine:150]  Intake/Output this shift: Total I/O In: 639.2 [I.V.:25; NG/GT:40; IV Piggyback:574.3] Out: 853   Lab Results: Recent Labs    04/19/22 0116 04/20/22 0130 04/21/22 0559  WBC 19.8* 23.6* 25.6*  HGB 10.4* 10.2* 10.2*  HCT 34.0* 34.4* 33.6*  PLT 242 288 347   BMET Recent Labs    04/20/22 0130 04/20/22 1627 04/21/22 0559  NA 134* 137 139  K 3.8 4.8 3.7  CL 100 101 102  CO2 23 21* 24  GLUCOSE 168* 174* 211*  BUN 30* 31* 33*  CREATININE 2.81* 2.97* 2.97*  CALCIUM 9.3 9.2 9.6   LFT Recent Labs    04/20/22 1127 04/20/22 1627 04/21/22 0559  PROT 8.0  --   --   ALBUMIN 2.0* 2.0* 2.1*  AST 35  --   --   ALT 20  --   --   ALKPHOS 103  --   --   BILITOT 0.6  --   --   BILIDIR 0.2  --   --   IBILI 0.4  --    --    PT/INR No results for input(s): "LABPROT", "INR" in the last 72 hours. Hepatitis Panel No results for input(s): "HEPBSAG", "HCVAB", "HEPAIGM", "HEPBIGM" in the last 72 hours.  Studies/Results: US Abdomen Limited  Result Date: 04/21/2022 CLINICAL DATA:  Dilated bile duct EXAM: ULTRASOUND ABDOMEN LIMITED RIGHT UPPER QUADRANT COMPARISON:  CT abdomen/pelvis 04/19/2022, right upper quadrant ultrasound 04/10/2022 FINDINGS: Gallbladder: Surgically absent. Common bile duct: Diameter: 7 mm, within normal limits for age and given history of cholecystectomy. Previously measured 10 mm. There is no intrahepatic biliary ductal dilatation. Liver: Parenchymal echogenicity is diffusely increased likely reflecting fatty infiltration. No focal lesions are seen. Portal vein is patent on color Doppler imaging with normal direction of blood flow towards the liver. Other: None. IMPRESSION: 1. Common bile duct measures 7 mm which is within expected limits given age and history of cholecystectomy, decreased from 10 mm on the prior study. No intrahepatic biliary ductal dilatation. 2. Probable fatty infiltration of the liver, unchanged. Electronically Signed   By: Valetta Mole M.D.   On: 04/21/2022 08:26   CT CHEST ABDOMEN PELVIS W CONTRAST  Result Date: 04/19/2022 CLINICAL DATA:  Sepsis, evaluate for source  of infection. Pancreatitis EXAM: CT CHEST, ABDOMEN, AND PELVIS WITH CONTRAST TECHNIQUE: Multidetector CT imaging of the chest, abdomen and pelvis was performed following the standard protocol during bolus administration of intravenous contrast. RADIATION DOSE REDUCTION: This exam was performed according to the departmental dose-optimization program which includes automated exposure control, adjustment of the mA and/or kV according to patient size and/or use of iterative reconstruction technique. CONTRAST:  111m OMNIPAQUE IOHEXOL 300 MG/ML  SOLN COMPARISON:  CT 04/10/2022 FINDINGS: CT CHEST FINDINGS Cardiovascular:  Central venous line with tip in the distal SVC. No pericardial effusion. No acute cardio vascular findings Mediastinum/Nodes: No mediastinal adenopathy. Trachea normal. Mild thickening of the esophagus. Feeding tube extends through the esophagus into the stomach. Lungs/Pleura: Mild basilar atelectasis at the RIGHT base. Cavitary lesion in the anterior LEFT upper lobe measuring 7.2 cm by 3.4 cm. This cavitary lesion is thick-walled similar to CT 03/12/2022. Musculoskeletal: No aggressive osseous lesion. CT ABDOMEN AND PELVIS FINDINGS Hepatobiliary: Postcholecystectomy. No intrahepatic biliary duct dilatation. Enhancing lesion in the liver. Pancreas: Again demonstrated extensive fluid along the body and tail the pancreas. The pancreas is edematous. Pancreas does enhance uniformly. There is no evidence of pancreatic ductal interruption. No organized fluid collections. Degree of pancreatic inflammation and fluid is increased from 04/10/2022. No vascular complication identified associated with the acute pancreatitis. Spleen: Normal spleen Adrenals/urinary tract: Adrenal glands and kidneys are normal. The ureters and bladder normal. Stomach/Bowel: Feeding tube extends into the fourth portion the duodenum. No small bowel obstruction. No pneumatosis or portal venous gas. Colon is normal. Vascular/Lymphatic: Abdominal aorta is normal caliber. There is no retroperitoneal or periportal lymphadenopathy. No pelvic lymphadenopathy. Reproductive: Unremarkable. Extensive streak artifact from the bowel hip prosthetics Other: No abdominal abscess Musculoskeletal: No aggressive osseous lesion. IMPRESSION: Chest Impression: 1. Mild RIGHT basilar atelectasis is new from comparison CT. 2. Stable thick-walled cavitary lesion in the LEFT upper lobe. 3. Mild thickening of the esophagus. Abdomen / Pelvis Impression: 1. Increase in fluid surrounding the pancreas compared to most recent CT. Findings consistent with acute pancreatitis. No  organized fluid collections at this point. Pancreas is edematous but no evidence of necrotizing pancreatitis as the pancreas enhances. 2. Feeding tube extends in the fourth portion duodenum. 3. No evidence of bowel obstruction or bowel ischemia. Electronically Signed   By: SSuzy BouchardM.D.   On: 04/19/2022 14:46   CT HEAD WO CONTRAST (5MM)  Result Date: 04/19/2022 CLINICAL DATA:  Altered mental status. EXAM: CT HEAD WITHOUT CONTRAST TECHNIQUE: Contiguous axial images were obtained from the base of the skull through the vertex without intravenous contrast. RADIATION DOSE REDUCTION: This exam was performed according to the departmental dose-optimization program which includes automated exposure control, adjustment of the mA and/or kV according to patient size and/or use of iterative reconstruction technique. COMPARISON:  None Available. FINDINGS: Brain: No evidence of acute infarction, hemorrhage, hydrocephalus, extra-axial collection or mass lesion/mass effect. Vascular: No hyperdense vessel or unexpected calcification. Skull: Normal. Negative for fracture or focal lesion. Sinuses/Orbits: No acute finding. Other: None. IMPRESSION: No acute intracranial abnormality seen. Electronically Signed   By: JMarijo ConceptionM.D.   On: 04/19/2022 14:34    Scheduled Meds:  [START ON 04/22/2022] amoxicillin-clavulanate  1 tablet Oral BID   amoxicillin-clavulanate  1 tablet Oral Once   arformoterol  15 mcg Nebulization BID   B-complex with vitamin C  1 tablet Per Tube Daily   budesonide (PULMICORT) nebulizer solution  0.5 mg Nebulization BID   Chlorhexidine Gluconate Cloth  6  each Topical Daily   feeding supplement  237 mL Oral TID BM   feeding supplement (PROSource TF20)  60 mL Per Tube TID   heparin  5,000 Units Subcutaneous Q8H   insulin aspart  0-20 Units Subcutaneous Q4H   insulin aspart  5 Units Subcutaneous Q4H   insulin glargine-yfgn  20 Units Subcutaneous BID   midodrine  15 mg Per Tube Q8H    mupirocin ointment  1 Application Nasal BID   mouth rinse  15 mL Mouth Rinse 4 times per day   simethicone  80 mg Per Tube QID   sodium chloride flush  10-40 mL Intracatheter Q12H   Continuous Infusions:  sodium chloride 10 mL/hr at 04/21/22 1100   feeding supplement (OSMOLITE 1.5 CAL) 20 mL/hr at 04/21/22 1100   heparin 10,000 units/ 20 mL infusion syringe 1,000 Units/hr (04/21/22 0307)   phenylephrine (NEO-SYNEPHRINE) Adult infusion Stopped (04/21/22 0934)   prismasol BGK 2/2.5 replacement solution 400 mL/hr at 04/21/22 0307   prismasol BGK 2/2.5 replacement solution 400 mL/hr at 04/21/22 0241   prismasol BGK 4/2.5 1,500 mL/hr at 04/21/22 0931   PRN Meds:.sodium chloride, acetaminophen, albuterol, dextrose, gabapentin, heparin, heparin, ondansetron (ZOFRAN) IV, mouth rinse, oxyCODONE, sodium chloride flush, white petrolatum  ASSESMENT:     Acute pancreatitis, severe w MOSF.  Attributed to elevated triglycerides but now w ? Biliary etiology.      Dilated CBD. s/p cholecystectomy 03/2021.  10.8 mm CBD per [place] on 8/5, today this measures 7 mm.  LFTs are normal.  Not a candidate for MRCP currently.   Though CBD stone never IDd on [place] or CT, the resolving CBD dilatation present at admission raises ? Of passed CBD stone.      Hepatic steatosis.  Liver bx in 03/2021: Minimal, grade 1 activity, stage III bridging fibrosis, mild macrovascular steatosis.  No evidence of cirrhosis on recent imaging.    Hx HCV, SVR after [name].   LFT T's normal.    AKI reqiring CRRT.  Initially nonoliguric but UOP decreased following initiation of pressors.    New dx AODM presenting w DKA.  A1c 12.5.     PLAN     Observation and supportive care.      Azucena Freed  04/21/2022, 12:12 PM Phone [phone]

## 2022-04-21 NOTE — Consult Note (Signed)
[facility]    Date of Admission:  04/10/2022     Reason for Consult: leukocytosis    Referring Provider: Loanne Drilling   Lines:  8/08-c Right ij hd cath  Abx: 8/15-c vanc 8/15-c azith 8/15-c piptazo        Assessment: 61 y.o. male alcohol abuse, dm2, copd on home o2 and cpap, ckd, admitted 8/6 for dka in shock/pancreatitis, course with leukocytosis for which id consulted   I discussed case with Dr Loanne Drilling. I agree with her no obvious source for leukocytosis, which is in discordant with how the patient is actually clinically appearing.  He does have a left upper lobe cavity which is stable with with new proximal opacity/infiltrate. I think it is reasonable to treat for pna there with oral abx for 3 weeks  In regard to the pancrea stranding on ct, I don't see a reason to presume that is infected at this time (he also has improving gi sx) and we can continue to watch  Plan: Stop iv abx Start amox-clav; plan 3 weeks from 8/14 F/u blood cx from 8/14 final report Will continue to monitor for intraabd infectious process Discussed with dr Loanne Drilling    I spent 75 minute reviewing data/chart, and coordinating care and >50% direct face to face time providing counseling/discussing diagnostics/treatment plan with patient       ------------------------------------------------ Principal Problem:   DKA (diabetic ketoacidosis) ([place]) Active Problems:   Essential hypertension   History of alcohol abuse   Acute respiratory failure with hypoxia ([place])   AKI on CKD stage 3b   GERD (gastroesophageal reflux disease)   Chronic obstructive pulmonary disease ([place])   Acute pancreatitis   SIRS (systemic inflammatory response syndrome) ([place])   Hypertriglyceridemia   Hypercholesterolemia   Lactic acidosis   Shock ([place])   Abnormal CT of the abdomen    HPI: Mathew Lara is a 61 y.o. male alcohol abuse, dm2, copd on home o2 and cpap, ckd, admitted 8/6  for dka in shock/pancreatitis, course with leukocytosis for which id consulted   Patient's admission bcx were negative He had mild leukocytosis on presentation and intermittent low grade temperature  On HD #7 started to have worsening leukocytosis. No differential. Lft normal  Overall he is feeling better (abdominal pain better, now with intermittent "burning" sensation). Tolerating tube feed. No n/v/diarrhea. No rash.   Repeat blood culture 8/14 ngtd  He has had issues with co2 retention and required intermittent bipap. Today on hfnc.   8/14 ct chest abd pelv showed chronic LUL cavitary changes that has been present for the past year, but with slight increased opacity inferio-medial aspect. He hasn't had cough or increased secretion. The pancrea does show increased stranding but again sx better and lipase previously trending down  8/16 abd u/s RUQ showed no intrahepatic biliary dilatation  He was started on bsAbx 8/14 without changes in leukocytosis  He has been getting crrt (no prior dialysis) for lytes/volume/acidosis control here in terms of aki on ckd. This is planned on being hold to see how he does as pulm/ccm also wants to evaluate for hemodynamics better  Family History  Problem Relation Age of Onset   Cancer Mother    Colon cancer Neg Hx    Colon polyps Neg Hx     Social History   Tobacco Use   Smoking status: Former    Packs/day: 1.00    Years: 42.00    Total  pack years: 42.00    Types: Cigarettes    Start date: 09/07/1971    Quit date: 05/28/2014    Years since quitting: 7.9    Passive exposure: Past   Smokeless tobacco: Never   Tobacco comments:    quit in November 2015 after smoking x 20 yrs.  Vaping Use   Vaping Use: Never used  Substance Use Topics   Alcohol use: Not Currently    Alcohol/week: 0.0 standard drinks of alcohol   Drug use: No    No Known Allergies  Review of Systems: ROS All Other ROS was negative, except mentioned above   Past  Medical History:  Diagnosis Date   Alcohol abuse    Anxiety    Bronchitis    Chronic hip pain    CKD (chronic kidney disease) stage 3, GFR 30-59 ml/min (H[facility])    COPD (chronic obstructive pulmonary disease) (H[facility])    DKA (diabetic ketoacidosis) ([place]) 04/10/2022   Essential hypertension    Hepatitis C    Mixed hyperlipidemia    Sleep apnea        Scheduled Meds:  [START ON 04/22/2022] amoxicillin-clavulanate  1 tablet Oral BID   amoxicillin-clavulanate  1 tablet Oral Once   arformoterol  15 mcg Nebulization BID   B-complex with vitamin C  1 tablet Per Tube Daily   budesonide (PULMICORT) nebulizer solution  0.5 mg Nebulization BID   Chlorhexidine Gluconate Cloth  6 each Topical Daily   feeding supplement  237 mL Oral TID BM   feeding supplement (PROSource TF20)  60 mL Per Tube TID   heparin  5,000 Units Subcutaneous Q8H   insulin aspart  0-20 Units Subcutaneous Q4H   insulin aspart  5 Units Subcutaneous Q4H   insulin glargine-yfgn  20 Units Subcutaneous BID   midodrine  15 mg Per Tube Q8H   mupirocin ointment  1 Application Nasal BID   mouth rinse  15 mL Mouth Rinse 4 times per day   simethicone  80 mg Per Tube QID   sodium chloride flush  10-40 mL Intracatheter Q12H   Continuous Infusions:  sodium chloride Stopped (04/21/22 1243)   feeding supplement (OSMOLITE 1.5 CAL) 20 mL/hr at 04/21/22 1500   heparin 10,000 units/ 20 mL infusion syringe 1,000 Units/hr (04/21/22 1256)   phenylephrine (NEO-SYNEPHRINE) Adult infusion Stopped (04/21/22 0934)   prismasol BGK 2/2.5 replacement solution 400 mL/hr at 04/21/22 1602   prismasol BGK 2/2.5 replacement solution 400 mL/hr at 04/21/22 1524   prismasol BGK 4/2.5 1,500 mL/hr at 04/21/22 1255   PRN Meds:.sodium chloride, acetaminophen, albuterol, dextrose, gabapentin, heparin, heparin, ondansetron (ZOFRAN) IV, mouth rinse, oxyCODONE, sodium chloride flush, white petrolatum   OBJECTIVE: Blood pressure 107/70, pulse (!) 110, temperature  98.6 F (37 C), temperature source Oral, resp. rate (!) 32, height '5\' 8"'$  (1.727 m), weight 125.9 kg, SpO2 95 %.  Physical Exam  General/constitutional: no distress, chronically ill appearing, pleasant, conversant; on hfnc (normally uses cpap at home for copd/home o2 2 liter); on tube feed HEENT: Normocephalic, PER, Conj Clear, EOMI, Oropharynx clear Neck supple CV: rrr no mrg Lungs: clear to auscultation, normal respiratory effort Abd: Soft, Nontender, obese Ext: no edema Skin: No Rash Neuro: nonfocal MSK: no peripheral joint swelling/tenderness/warmth; back spines nontender   Central line presence: right ij hd catheter site no erythema/purulence   Lab Results Lab Results  Component Value Date   WBC 25.6 (H) 04/21/2022   WBC 25.7 (H) 04/21/2022   HGB 10.2 (L) 04/21/2022  HGB 10.4 (L) 04/21/2022   HCT 33.6 (L) 04/21/2022   HCT 33.7 (L) 04/21/2022   MCV 86.6 04/21/2022   MCV 85.5 04/21/2022   PLT 347 04/21/2022   PLT 342 04/21/2022    Lab Results  Component Value Date   CREATININE 2.97 (H) 04/21/2022   BUN 33 (H) 04/21/2022   NA 139 04/21/2022   K 3.7 04/21/2022   CL 102 04/21/2022   CO2 24 04/21/2022    Lab Results  Component Value Date   ALT 20 04/20/2022   AST 35 04/20/2022   ALKPHOS 103 04/20/2022   BILITOT 0.6 04/20/2022      Microbiology: Recent Results (from the past 240 hour(s))  Expectorated Sputum Assessment w Gram Stain, Rflx to Resp Cult     Status: None   Collection Time: 04/14/22  2:20 PM   Specimen: Expectorated Sputum  Result Value Ref Range Status   Specimen Description EXPECTORATED SPUTUM  Final   Special Requests NONE  Final   Sputum evaluation   Final    Sputum specimen not acceptable for testing.  Please recollect.   NOTIFIED RN LINDY HOPPER ON 04/14/22 @ 2328 BY DRT Performed at [facility], [place] [address], [place], [place] [address]    Report Status 04/14/2022 FINAL  Final  Expectorated Sputum Assessment w Gram Stain, Rflx to  Resp Cult     Status: None   Collection Time: 04/15/22  7:56 PM   Specimen: Sputum  Result Value Ref Range Status   Specimen Description SPU  Final   Special Requests NONE  Final   Sputum evaluation   Final    Sputum specimen not acceptable for testing.  Please recollect.   NOTIFIED STEPHANIE VIVERITO ON 04/15/22 @ 2259 BY DRT Performed at [facility], [place] [address] [place], [place] [address]    Report Status 04/15/2022 FINAL  Final  Culture, blood (Routine X 2) w Reflex to ID Panel     Status: None (Preliminary result)   Collection Time: 04/19/22  9:30 AM   Specimen: BLOOD  Result Value Ref Range Status   Specimen Description BLOOD RIGHT ANTECUBITAL  Final   Special Requests   Final    BOTTLES DRAWN AEROBIC AND ANAEROBIC Blood Culture adequate volume   Culture   Final    NO GROWTH 2 DAYS Performed at [facility], [place] [address], [place], [place] [address]    Report Status PENDING  Incomplete  Culture, blood (Routine X 2) w Reflex to ID Panel     Status: None (Preliminary result)   Collection Time: 04/19/22  9:32 AM   Specimen: BLOOD RIGHT FOREARM  Result Value Ref Range Status   Specimen Description BLOOD RIGHT FOREARM  Final   Special Requests   Final    BOTTLES DRAWN AEROBIC AND ANAEROBIC Blood Culture adequate volume   Culture   Final    NO GROWTH 2 DAYS Performed at [facility], [place] [address], [place], [place] [address]    Report Status PENDING  Incomplete     Serology:    Imaging: If present, new imagings (plain films, ct scans, and mri) have been personally visualized and interpreted; radiology reports have been reviewed. Decision making incorporated into the Impression / Recommendations.  8/16 [place] ruq 1. Common bile duct measures 7 mm which is within expected limits given age and history of cholecystectomy, decreased from 10 mm on the prior study. No intrahepatic biliary ductal dilatation. 2. Probable  fatty infiltration of the liver,  unchanged.  8/14 ct chest abd pelv with contrast Chest Impression:   1. Mild RIGHT basilar atelectasis is new from comparison CT. 2. Stable thick-walled cavitary lesion in the LEFT upper lobe. 3. Mild thickening of the esophagus.   Abdomen / Pelvis Impression:   1. Increase in fluid surrounding the pancreas compared to most recent CT. Findings consistent with acute pancreatitis. No organized fluid collections at this point. Pancreas is edematous but no evidence of necrotizing pancreatitis as the pancreas enhances. 2. Feeding tube extends in the fourth portion duodenum. 3. No evidence of bowel obstruction or bowel ischemia.   Jabier Mutton, [facility] [place] [phone] pager    04/21/2022, 4:05 PM

## 2022-04-22 DIAGNOSIS — J9601 Acute respiratory failure with hypoxia: Secondary | ICD-10-CM | POA: Diagnosis not present

## 2022-04-22 DIAGNOSIS — E1110 Type 2 diabetes mellitus with ketoacidosis without coma: Secondary | ICD-10-CM | POA: Diagnosis not present

## 2022-04-22 DIAGNOSIS — J189 Pneumonia, unspecified organism: Secondary | ICD-10-CM | POA: Diagnosis not present

## 2022-04-22 DIAGNOSIS — N179 Acute kidney failure, unspecified: Secondary | ICD-10-CM | POA: Diagnosis not present

## 2022-04-22 DIAGNOSIS — K851 Biliary acute pancreatitis without necrosis or infection: Secondary | ICD-10-CM | POA: Diagnosis not present

## 2022-04-22 LAB — GLUCOSE, CAPILLARY
Glucose-Capillary: 184 mg/dL — ABNORMAL HIGH (ref 70–99)
Glucose-Capillary: 193 mg/dL — ABNORMAL HIGH (ref 70–99)
Glucose-Capillary: 206 mg/dL — ABNORMAL HIGH (ref 70–99)
Glucose-Capillary: 212 mg/dL — ABNORMAL HIGH (ref 70–99)
Glucose-Capillary: 228 mg/dL — ABNORMAL HIGH (ref 70–99)
Glucose-Capillary: 229 mg/dL — ABNORMAL HIGH (ref 70–99)
Glucose-Capillary: 230 mg/dL — ABNORMAL HIGH (ref 70–99)

## 2022-04-22 LAB — RENAL FUNCTION PANEL
Albumin: 2 g/dL — ABNORMAL LOW (ref 3.5–5.0)
Albumin: 2 g/dL — ABNORMAL LOW (ref 3.5–5.0)
Anion gap: 13 (ref 5–15)
Anion gap: 14 (ref 5–15)
BUN: 50 mg/dL — ABNORMAL HIGH (ref 8–23)
BUN: 61 mg/dL — ABNORMAL HIGH (ref 8–23)
CO2: 23 mmol/L (ref 22–32)
CO2: 20 mmol/L — ABNORMAL LOW (ref 22–32)
Calcium: 9.9 mg/dL (ref 8.9–10.3)
Calcium: 9.5 mg/dL (ref 8.9–10.3)
Chloride: 101 mmol/L (ref 98–111)
Chloride: 103 mmol/L (ref 98–111)
Creatinine, Ser: 4.35 mg/dL — ABNORMAL HIGH (ref 0.61–1.24)
Creatinine, Ser: 5.25 mg/dL — ABNORMAL HIGH (ref 0.61–1.24)
GFR, Estimated: 15 mL/min — ABNORMAL LOW (ref >60)
GFR, Estimated: 12 mL/min — ABNORMAL LOW (ref >60)
Glucose, Bld: 244 mg/dL — ABNORMAL HIGH (ref 70–99)
Glucose, Bld: 234 mg/dL — ABNORMAL HIGH (ref 70–99)
Phosphorus: 3.4 mg/dL (ref 2.5–4.6)
Phosphorus: 5.1 mg/dL — ABNORMAL HIGH (ref 2.5–4.6)
Potassium: 3.4 mmol/L — ABNORMAL LOW (ref 3.5–5.1)
Potassium: 3.8 mmol/L (ref 3.5–5.1)
Sodium: 137 mmol/L (ref 135–145)
Sodium: 137 mmol/L (ref 135–145)

## 2022-04-22 LAB — CBC
HCT: 32.7 % — ABNORMAL LOW (ref 39.0–52.0)
Hemoglobin: 9.8 g/dL — ABNORMAL LOW (ref 13.0–17.0)
MCH: 26.5 pg (ref 26.0–34.0)
MCHC: 30 g/dL (ref 30.0–36.0)
MCV: 88.4 fL (ref 80.0–100.0)
Platelets: 348 10*3/uL (ref 150–400)
RBC: 3.7 MIL/uL — ABNORMAL LOW (ref 4.22–5.81)
RDW: 16.4 % — ABNORMAL HIGH (ref 11.5–15.5)
WBC: 26 10*3/uL — ABNORMAL HIGH (ref 4.0–10.5)
nRBC: 0.3 % — ABNORMAL HIGH (ref 0.0–0.2)

## 2022-04-22 LAB — CRYPTOCOCCAL ANTIGEN: Crypto Ag: NEGATIVE

## 2022-04-22 LAB — MAGNESIUM: Magnesium: 3 mg/dL — ABNORMAL HIGH (ref 1.7–2.4)

## 2022-04-22 MED ORDER — PRISMASOL BGK 4/2.5 32-4-2.5 MEQ/L REPLACEMENT SOLN
Status: DC
  Filled 2022-04-22 (×10): qty 5000

## 2022-04-22 MED ORDER — HEPARIN SODIUM (PORCINE) 1000 UNIT/ML DIALYSIS
1000.0000 [IU] | INTRAMUSCULAR | Status: DC | PRN
  Administered 2022-04-25: 2800 [IU] via INTRAVENOUS_CENTRAL
  Filled 2022-04-22 (×3): qty 6

## 2022-04-22 MED ORDER — AMOXICILLIN-POT CLAVULANATE 250-62.5 MG/5ML PO SUSR
500.0000 mg | Freq: Two times a day (BID) | ORAL | Status: DC
Start: 2022-04-22 — End: 2022-05-07
  Administered 2022-04-22 – 2022-05-06 (×28): 500 mg
  Filled 2022-04-22 (×34): qty 10

## 2022-04-22 MED ORDER — INSULIN ASPART 100 UNIT/ML IJ SOLN
8.0000 [IU] | INTRAMUSCULAR | Status: DC
  Administered 2022-04-22 – 2022-04-25 (×17): 8 [IU] via SUBCUTANEOUS

## 2022-04-22 NOTE — Progress Notes (Signed)
RT placed patient on bipap due to patients work of breathing and dieretic per NP. Patient tolerating well at this time. RT will continue to monitor.

## 2022-04-22 NOTE — Significant Event (Signed)
Care Contact Phone Call  Cresson Schafer's daughter, Sofie Rower, at [phone]. They were updated on Gary L Majette's clinical status and plan.  Redmond School., MSN, APRN, AGACNP-BC [facility]  04/22/2022 , 2:15 PM  Please see [email or web address] for pager details  If no response, please call [phone] After hours, please call Elink at [phone]

## 2022-04-22 NOTE — Progress Notes (Signed)
Pt refused BiPAP at this time, currently comfortable on HFNC 30L 60%. Pt states that if his wob gets worse we will try the BiPAP. RT will continue to monitor.

## 2022-04-22 NOTE — Inpatient Diabetes Management (Signed)
Inpatient Diabetes Program Recommendations  AACE/ADA: New Consensus Statement on Inpatient Glycemic Control (2015)  Target Ranges:  Prepandial:   less than 140 mg/dL      Peak postprandial:   less than 180 mg/dL (1-2 hours)      Critically ill patients:  140 - 180 mg/dL   Lab Results  Component Value Date   GLUCAP 229 (H) 04/22/2022   HGBA1C 12.5 (H) 04/10/2022    Review of Glycemic Control  Current orders for Inpatient glycemic control:  Novolog 0-20 units Q4H Novolog 5 units Q4H tube feed coverage Semglee 20 units BID  Inpatient Diabetes Program Recommendations:    Tube feeds on hold,  thus tube feed coverage is being held.  If glucose remains > 200 mg/dL might consider:  Semglee 22 units BID  Will continue to follow while inpatient.  Thank you, Reche Dixon, MSN, [place] Diabetes Coordinator Inpatient Diabetes Program [phone] (team pager from 8a-5p)

## 2022-04-22 NOTE — Progress Notes (Signed)
Mathew Lara Progress Note   Assessment/ Plan:   AKI/CKD stage IIIb-IV - presumably ischemic ATN in setting of acute pancreatitis, hypotension, pressor support, with concomitant ARB, SGLT-2 inhibitor and finerenone.  Initially non-oliguric but now UOP has decreased since being placed on pressors.  Started on CRRT 04/13/22.  RIJ temp HD cath placed 04/13/22.  Continue to hold losartan, Jardiance, and [place].   Restart CRRT Keep MAP >65 Renal dose meds for eGFR <15 mL/min.   Avoid nephrotoxic agents such as IV contrast, NSAIDs, and phosphate containing bowel preps (FLEETS).   Preferred narcotics are hydromorphone, fentanyl, and methadone.  Morphine should be avoided. Acute pancreatitis  - GI following, appreciate assistance  - likely d/t triglycerides  - ? CBD dilatation on imaging, may be worth doing MRCP but defer to expertise- newer MRI contrast agents are much safer for AKI/ ESRD than the old ones DKA- resolved  Shock - likely due to pancreatitis + cavitary pneumonia -   Repeat CT abd pelvis 8/14 with edematous pancreas but no evidence of necrotizing pancreatitis.   - on vanc/ cefepime/ flagyl/ azithro - on midodrine 15 TID Acute hypoxic respiratory failure - with underlying COPD and OSA.  Per PCCM Hep C with advanced liver fibrosis Chronic diastolic CHF - stable.  Subjective:    Seen in room.  Still looking shocky off CRRT- hasn't improved Bps a lot   Objective:   BP 101/74   Pulse (!) 109   Temp (!) 97.3 F (36.3 C) (Axillary)   Resp (!) 23   Ht '5\' 8"'  (1.727 m)   Wt 124.4 kg   SpO2 95%   BMI 41.70 kg/m   Physical Exam: IEP:PIRJJOA ill CVS: tachycardic Resp: tachypneic Abd: soft, obese, somewhat distended, mildly tender Ext: no LE edema ACCESS: R IJ nontunneled HD cath  Labs: BMET Recent Labs  Lab 04/19/22 0116 04/19/22 1710 04/20/22 0130 04/20/22 1627 04/21/22 0559 04/21/22 1643 04/22/22 0323  NA 137 133* 134* 137 139 139 137  K 3.8 3.7 3.8  4.8 3.7 3.6 3.4*  CL 101 100 100 101 102 103 101  CO2 '23 23 23 ' 21* '24 23 23  ' GLUCOSE 169* 271* 168* 174* 211* 234* 244*  BUN 32* 32* 30* 31* 33* 35* 50*  CREATININE 2.47* 2.86* 2.81* 2.97* 2.97* 3.00* 4.35*  CALCIUM 9.2 8.6* 9.3 9.2 9.6 9.6 9.9  PHOS 2.4* 3.1 3.5 3.4 2.7 2.7 3.4   CBC Recent Labs  Lab 04/16/22 0520 04/17/22 0053 04/19/22 0116 04/20/22 0130 04/21/22 0559 04/22/22 0323  WBC 14.7*   < > 19.8* 23.6* 25.7*  25.6* 26.0*  NEUTROABS 12.1*  --   --   --  21.0*  --   HGB 10.0*   < > 10.4* 10.2* 10.4*  10.2* 9.8*  HCT 32.5*   < > 34.0* 34.4* 33.7*  33.6* 32.7*  MCV 86.7   < > 88.3 89.1 85.5  86.6 88.4  PLT 200   < > 242 288 342  347 348   < > = values in this interval not displayed.      Medications:     amoxicillin-clavulanate  500 mg Per Tube Q12H   arformoterol  15 mcg Nebulization BID   B-complex with vitamin C  1 tablet Per Tube Daily   budesonide (PULMICORT) nebulizer solution  0.5 mg Nebulization BID   Chlorhexidine Gluconate Cloth  6 each Topical Daily   feeding supplement  237 mL Oral TID BM   heparin  5,000 Units  Subcutaneous Q8H   insulin aspart  0-20 Units Subcutaneous Q4H   insulin aspart  5 Units Subcutaneous Q4H   insulin glargine-yfgn  20 Units Subcutaneous BID   midodrine  15 mg Per Tube Q8H   mupirocin ointment  1 Application Nasal BID   mouth rinse  15 mL Mouth Rinse 4 times per day   simethicone  80 mg Per Tube QID   sodium chloride flush  10-40 mL Intracatheter Q12H     Madelon Lips MD 04/22/2022, 1:09 PM

## 2022-04-22 NOTE — Progress Notes (Signed)
NAME:  Mathew Lara, MRN:  [id number], DOB:  [birth year], LOS: 12 ADMISSION DATE:  04/10/2022, CONSULTATION DATE:  8/5 REFERRING MD:  Darrell Jewel, CHIEF COMPLAINT: Abdominal pain  History of Present Illness:  Mathew Lara is an 61 y.o. M who presented to the [facility] ED on 8/5 with a chief complaint of abdominal pain.  They have a past medical history of alcohol abuse, anxiety, CKD 3, COPD, OSA, type 2 diabetes, hypertension, hyperlipidemia, hep C  ED work-up revealed DKA, acute pancreatitis, AKI.  He was admitted to the hospital service.  Overnight he developed continuing respiratory distress, shock.   PCCM was consulted for transfer to [facility] for further management.  Still requiring pressors, abdominal pain is improved, respiratory status also appears better  Pertinent  Medical History  Alcohol abuse, anxiety, CKD 3, COPD, OSA, type 2 diabetes, hypertension, hyperlipidemia, hep C  Significant Hospital Events: Including procedures, antibiotic start and stop dates in addition to other pertinent events   8/5 presented to [facility] ED, transferred [facility] 8/7 on pressors  8/8 on BiPAP, worsening renal parameters 8/9 tolerating CRRT, on BiPAP 8/10 has been off BiPAP, on high flow oxygen and tolerating 8/11 UOP improving. CRRT with net even UF 8/14 CT reveals worsening cavitary pneumonia and acute pancreatitis, large BM 8/15 abx changed for cavitary pneumonia 8/16 ID consult.  ID stopped vancomycin, Zosyn, azithromycin >narrowed to augmentin. CRRT stopped  Interim History / Subjective:  Tmax 98.8  +644 mL admission, +20 mL past 24 hours, 1 stool past 24 hours  No drips  Subjective: Denies shortness of breath, denies chest pain.  Objective   Blood pressure 131/89, pulse (!) 113, temperature 98.9 F (37.2 C), temperature source Oral, resp. rate (!) 26, height '5\' 8"'$  (1.727 m), weight 124.4 kg, SpO2 100 %.    Vent Mode: BIPAP;PCV FiO2 (%):  [60 %] 60 % Set Rate:   [18 bmp] 18 bmp PEEP:  [5 cmH20] 5 cmH20   Intake/Output Summary (Last 24 hours) at 04/22/2022 1028 Last data filed at 04/22/2022 0700 Gross per 24 hour  Intake 939.83 ml  Output 845 ml  Net 94.83 ml   Filed Weights   04/20/22 0500 04/21/22 0315 04/22/22 0400  Weight: 129.1 kg 125.9 kg 124.4 kg   Physical Exam: General: In bed, NAD, appears comfortable, on BiPAP HEENT: MM pink/moist, anicteric, atraumatic Neuro: RASS 0, PERRL 20m, Follows commands, moves all extremites CV: S1S2, ST, no m/r/g appreciated PULM:  air movement in all lobes, no wheezes, trachea midline, chest expansion symmetric GI: soft, bsx4 active, non-tender   Extremities: warm/dry, no pretibial edema, capillary refill less than 3 seconds  Skin:  no rashes or lesions noted  Labs/imaging WBC 25.6 > 25.7 > 26 Hemoglobin 10.2 > 10.4 > 9.8 Magnesium 3.0 Potassium 3.4 Glucose 179- 255 K 3.4 Creatinine 2.97 > 3.0 > 4.35 BUN 35 > 50 Blood cultures no growth to date day 3 Ultrasound abdomen: Common bile duct measures 7 mm decreased from 10 mm.  Fatty liver.   Resolved Hospital Problem list     Assessment & Plan:  LUL Cavitary pneumonia Paraseptal emphysema Underlying obstructive sleep apnea Hx post-infectious scarring - no ILD on CT Bcx 8/10 NGTD. CXR 8/11 no acute findings. KUB 8/12 unremarkable. Cavitary PNA on CT chest never treated. WBC 25.6 > 25.7 > 26 -Goal SPO2 88 to 96% -Continue Brovana, Pulmicort and as needed albuterol -Appreciate ID assistance.  Continue Augmentin -Dr. ELoanne Drillingconsidering bronchoscopy to rule out  atypical or fungal infection -Follow-up blood cultures  Acute metabolic encephalopathy - improves with BiPAP, suspect hypercarbic failure +/- hypoactive delirium and sepsis. CT head neg -Frequent reorientation, supportive management -Recommend BiPAP with nap  Multifactorial shock-resolved -Continue midodrine 15 mg 3 times daily -Continue antibiotics as above  Acute kidney injury  on chronic kidney disease 2/2 ATN in setting of acute pancreatitis and shock CRRT stopped afternoon 8/16. -Appreciate nephrology assistance -Goal MAP 65 to maintain renal perfusion -CRRT per nephrology -Avoid nephrotoxic medications  Acute pancreatitis 2/2 hypertriglyceridemia, choledocholithiasis Fatty liver with fibrosis History of hepatitis C with advanced liver fibrosis Followed up with Dr. Abbey Chatters with [facility] gastroenterology. Lipase normalized, improving TG RUQ [place] 04/10/22 - CBD dilatation 10.8 mm. Ultrasound abdomen 8/16: Common bile duct measures 7 mm decreased from 10 mm.  Fatty liver.  GI feels that ultrasound shows normalizing of the CBD, ?  Past gallstone in addition to triglycerides being because of acute pancreatitis. -Continue tube feedings -Continue pain management with Tylenol 650 mg every 6 hours as needed and oxycodone 10 mg every 6 hours as needed  Type 2 diabetes DKA - resolved Glucose 179- 255 -Continue to medically 20 units twice daily -Continue sliding scale resistant with tube feed coverage -Tube feeds on hold secondary to BiPAP, if tube feeding resumed we will go up on tube feeding coverage  History of diastolic heart failure -Echo 12/25/2019 revealed ejection fraction of 60 to 65% -Continue telemetry  Constipation - improving Bowel movement overnight.  Status post methylnaltrexone on 8/14 and smog enema -Bowel regimen -Trickle feeds -Monitor stool output -Minimize narcotics  Hiccups -Continue gabapentin 100 mg  Best Practice (right click and "Reselect all SmartList Selections" daily)   Diet/type: tubefeeds and clear liquids DVT prophylaxis: prophylactic heparin  GI prophylaxis: N/A Lines: Dialysis Catheter and yes and it is still needed Foley:  N/A Code Status:  full code Last date of multidisciplinary goals of care discussion [pending] Updated daughter on 04/21/22 on plan above. Will call today.  Critical care time: 39 minutes  Redmond School., MSN, APRN, AGACNP-BC [facility]  04/22/2022 , 11:00 AM  Please see [email or web address] for pager details  If no response, please call [phone] After hours, please call Elink at [phone]

## 2022-04-22 NOTE — Progress Notes (Signed)
[place] for Infectious Disease  Date of Admission:  04/10/2022     Lines:  8/08-c Right ij hd cath   Abx: 8/15-c vanc 8/15-c azith 8/15-c piptazo                                                                 Assessment: 61 y.o. male alcohol abuse, dm2, copd on home o2 and cpap, ckd, admitted 8/6 for dka in shock/pancreatitis, course with leukocytosis for which id consulted     I discussed case with Dr Loanne Drilling. I agree with her no obvious source for leukocytosis, which is in discordant with how the patient is actually clinically appearing.   He does have a left upper lobe cavity which is stable with with new proximal opacity/infiltrate. I think it is reasonable to treat for pna there with oral abx for 3 weeks   In regard to the pancrea stranding on ct, I don't see a reason to presume that is infected at this time (he also has improving gi sx) and we can continue to watch  ------------------ 8/17 assessment Patient remains intermittently confused and needing bipap, in setting volume overload, copd, right upper lobe cavitary changes, and chronic pancreatitis Also anuric and needing to go back on crrt; remains on midodrine Bcx ngtd Afebrile Wbc stable    Plan: Would continue amox-clav; plan 3 weeks from 8/14 Discussed with dr Loanne Drilling Chi, will maintain close monitoring for sepsis and low threshold to broaden abx with repeat septic workup as needed given patient's low reserve (copd/liver fibrosis-chronic etoh use) F/u blood cx from 8/14 final report   I spent more than 35 minute reviewing data/chart, and coordinating care and >50% direct face to face time providing counseling/discussing diagnostics/treatment plan with patient      Principal Problem:   DKA (diabetic ketoacidosis) ([place]) Active Problems:   Essential hypertension   History of alcohol abuse   Acute respiratory failure with hypoxia ([place])   AKI on CKD stage 3b   GERD (gastroesophageal reflux  disease)   Chronic obstructive pulmonary disease (HCC)   Acute pancreatitis   SIRS (systemic inflammatory response syndrome) (HCC)   Hypertriglyceridemia   Hypercholesterolemia   Lactic acidosis   Shock ([place])   Abnormal CT of the abdomen   Cavitary pneumonia   Leukocytosis   Bile duct abnormality   No Known Allergies  Scheduled Meds:  amoxicillin-clavulanate  500 mg Per Tube Q12H   arformoterol  15 mcg Nebulization BID   B-complex with vitamin C  1 tablet Per Tube Daily   budesonide (PULMICORT) nebulizer solution  0.5 mg Nebulization BID   Chlorhexidine Gluconate Cloth  6 each Topical Daily   feeding supplement  237 mL Oral TID BM   heparin  5,000 Units Subcutaneous Q8H   insulin aspart  0-20 Units Subcutaneous Q4H   insulin aspart  5 Units Subcutaneous Q4H   insulin glargine-yfgn  20 Units Subcutaneous BID   midodrine  15 mg Per Tube Q8H   mupirocin ointment  1 Application Nasal BID   mouth rinse  15 mL Mouth Rinse 4 times per day   simethicone  80 mg Per Tube QID   sodium chloride flush  10-40 mL Intracatheter Q12H   Continuous  Infusions:   prismasol BGK 4/2.5      prismasol BGK 4/2.5     sodium chloride Stopped (04/21/22 1243)   feeding supplement (VITAL 1.5 CAL) 20 mL/hr at 04/22/22 1248   heparin 10,000 units/ 20 mL infusion syringe Stopped (04/21/22 1720)   prismasol BGK 4/2.5 Stopped (04/21/22 1720)   PRN Meds:.sodium chloride, acetaminophen, albuterol, dextrose, gabapentin, heparin, heparin, ondansetron (ZOFRAN) IV, mouth rinse, oxyCODONE, sodium chloride flush, white petrolatum   SUBJECTIVE: Remains on bipap Afebrile Wbc stable Anuric; nephrology plans repeat crrt   Review of Systems: ROS All other ROS was negative, except mentioned above     OBJECTIVE: Vitals:   04/22/22 1100 04/22/22 1125 04/22/22 1140 04/22/22 1228  BP: 101/74     Pulse: (!) 111   (!) 109  Resp: (!) 26   (!) 23  Temp:  (!) 97.3 F (36.3 C)    TempSrc:  Axillary    SpO2:  100%  98% 95%  Weight:      Height:       Body mass index is 41.7 kg/m.  Physical Exam  General/constitutional: chronically ill appearing; on bipap; opens eyes; appear more somnolent today HEENT: Normocephalic, PER, Conj Clear CV: rrr no mrg Lungs: clear on bipap Abd: Soft, Nontender; obese/distended Ext: no edema Skin: No Rash Neuro: somnolent; no obvious motor deficit/assymetry MSK: no peripheral joint swelling/tenderness/warmth  Central line presence: right ij hd line site no purulence   Lab Results Lab Results  Component Value Date   WBC 26.0 (H) 04/22/2022   HGB 9.8 (L) 04/22/2022   HCT 32.7 (L) 04/22/2022   MCV 88.4 04/22/2022   PLT 348 04/22/2022    Lab Results  Component Value Date   CREATININE 4.35 (H) 04/22/2022   BUN 50 (H) 04/22/2022   NA 137 04/22/2022   K 3.4 (L) 04/22/2022   CL 101 04/22/2022   CO2 23 04/22/2022    Lab Results  Component Value Date   ALT 20 04/20/2022   AST 35 04/20/2022   ALKPHOS 103 04/20/2022   BILITOT 0.6 04/20/2022      Microbiology: Recent Results (from the past 240 hour(s))  Expectorated Sputum Assessment w Gram Stain, Rflx to Resp Cult     Status: None   Collection Time: 04/14/22  2:20 PM   Specimen: Expectorated Sputum  Result Value Ref Range Status   Specimen Description EXPECTORATED SPUTUM  Final   Special Requests NONE  Final   Sputum evaluation   Final    Sputum specimen not acceptable for testing.  Please recollect.   NOTIFIED RN LINDY HOPPER ON 04/14/22 @ 2328 BY DRT Performed at [facility], [place] [address] [place], [place] [address]    Report Status 04/14/2022 FINAL  Final  Expectorated Sputum Assessment w Gram Stain, Rflx to Resp Cult     Status: None   Collection Time: 04/15/22  7:56 PM   Specimen: Sputum  Result Value Ref Range Status   Specimen Description SPU  Final   Special Requests NONE  Final   Sputum evaluation   Final    Sputum specimen not acceptable for testing.  Please recollect.    NOTIFIED STEPHANIE VIVERITO ON 04/15/22 @ 2259 BY DRT Performed at [facility], [place] [address], [place], [place] [address]    Report Status 04/15/2022 FINAL  Final  Culture, blood (Routine X 2) w Reflex to ID Panel     Status: None (Preliminary result)   Collection Time: 04/19/22  9:30 AM   Specimen: BLOOD  Result Value Ref Range Status   Specimen Description BLOOD RIGHT ANTECUBITAL  Final   Special Requests   Final    BOTTLES DRAWN AEROBIC AND ANAEROBIC Blood Culture adequate volume   Culture   Final    NO GROWTH 3 DAYS Performed at [facility], [address] [place], [place] [address]    Report Status PENDING  Incomplete  Culture, blood (Routine X 2) w Reflex to ID Panel     Status: None (Preliminary result)   Collection Time: 04/19/22  9:32 AM   Specimen: BLOOD RIGHT FOREARM  Result Value Ref Range Status   Specimen Description BLOOD RIGHT FOREARM  Final   Special Requests   Final    BOTTLES DRAWN AEROBIC AND ANAEROBIC Blood Culture adequate volume   Culture   Final    NO GROWTH 3 DAYS Performed at [facility], [place] [address], [place], [place] [address]    Report Status PENDING  Incomplete     Serology:   Imaging: If present, new imagings (plain films, ct scans, and mri) have been personally visualized and interpreted; radiology reports have been reviewed. Decision making incorporated into the Impression / Recommendations.  8/16 [place] ruq 1. Common bile duct measures 7 mm which is within expected limits given age and history of cholecystectomy, decreased from 10 mm on the prior study. No intrahepatic biliary ductal dilatation. 2. Probable fatty infiltration of the liver, unchanged.   8/14 ct chest abd pelv with contrast Chest Impression:   1. Mild RIGHT basilar atelectasis is new from comparison CT. 2. Stable thick-walled cavitary lesion in the LEFT upper lobe. 3. Mild thickening of the esophagus.   Abdomen / Pelvis Impression:   1. Increase  in fluid surrounding the pancreas compared to most recent CT. Findings consistent with acute pancreatitis. No organized fluid collections at this point. Pancreas is edematous but no evidence of necrotizing pancreatitis as the pancreas enhances. 2. Feeding tube extends in the fourth portion duodenum. 3. No evidence of bowel obstruction or bowel ischemia.     Jabier Mutton, [place] for Infectious [place] [phone] pager    04/22/2022, 1:38 PM

## 2022-04-23 DIAGNOSIS — E1110 Type 2 diabetes mellitus with ketoacidosis without coma: Secondary | ICD-10-CM | POA: Diagnosis not present

## 2022-04-23 DIAGNOSIS — J189 Pneumonia, unspecified organism: Secondary | ICD-10-CM | POA: Diagnosis not present

## 2022-04-23 DIAGNOSIS — J9601 Acute respiratory failure with hypoxia: Secondary | ICD-10-CM | POA: Diagnosis not present

## 2022-04-23 DIAGNOSIS — K851 Biliary acute pancreatitis without necrosis or infection: Secondary | ICD-10-CM | POA: Diagnosis not present

## 2022-04-23 DIAGNOSIS — N179 Acute kidney failure, unspecified: Secondary | ICD-10-CM | POA: Diagnosis not present

## 2022-04-23 LAB — RENAL FUNCTION PANEL
Albumin: 2 g/dL — ABNORMAL LOW (ref 3.5–5.0)
Albumin: 2.3 g/dL — ABNORMAL LOW (ref 3.5–5.0)
Anion gap: 10 (ref 5–15)
Anion gap: 13 (ref 5–15)
BUN: 47 mg/dL — ABNORMAL HIGH (ref 8–23)
BUN: 37 mg/dL — ABNORMAL HIGH (ref 8–23)
CO2: 26 mmol/L (ref 22–32)
CO2: 23 mmol/L (ref 22–32)
Calcium: 9.4 mg/dL (ref 8.9–10.3)
Calcium: 9.6 mg/dL (ref 8.9–10.3)
Chloride: 102 mmol/L (ref 98–111)
Chloride: 101 mmol/L (ref 98–111)
Creatinine, Ser: 4.1 mg/dL — ABNORMAL HIGH (ref 0.61–1.24)
Creatinine, Ser: 3.51 mg/dL — ABNORMAL HIGH (ref 0.61–1.24)
GFR, Estimated: 16 mL/min — ABNORMAL LOW (ref >60)
GFR, Estimated: 19 mL/min — ABNORMAL LOW (ref >60)
Glucose, Bld: 204 mg/dL — ABNORMAL HIGH (ref 70–99)
Glucose, Bld: 183 mg/dL — ABNORMAL HIGH (ref 70–99)
Phosphorus: 3.6 mg/dL (ref 2.5–4.6)
Phosphorus: 3.8 mg/dL (ref 2.5–4.6)
Potassium: 3.7 mmol/L (ref 3.5–5.1)
Potassium: 4.2 mmol/L (ref 3.5–5.1)
Sodium: 138 mmol/L (ref 135–145)
Sodium: 137 mmol/L (ref 135–145)

## 2022-04-23 LAB — GLUCOSE, CAPILLARY
Glucose-Capillary: 193 mg/dL — ABNORMAL HIGH (ref 70–99)
Glucose-Capillary: 167 mg/dL — ABNORMAL HIGH (ref 70–99)
Glucose-Capillary: 190 mg/dL — ABNORMAL HIGH (ref 70–99)
Glucose-Capillary: 229 mg/dL — ABNORMAL HIGH (ref 70–99)
Glucose-Capillary: 185 mg/dL — ABNORMAL HIGH (ref 70–99)
Glucose-Capillary: 171 mg/dL — ABNORMAL HIGH (ref 70–99)

## 2022-04-23 LAB — CBC
HCT: 30.3 % — ABNORMAL LOW (ref 39.0–52.0)
Hemoglobin: 9.1 g/dL — ABNORMAL LOW (ref 13.0–17.0)
MCH: 26.6 pg (ref 26.0–34.0)
MCHC: 30 g/dL (ref 30.0–36.0)
MCV: 88.6 fL (ref 80.0–100.0)
Platelets: 374 10*3/uL (ref 150–400)
RBC: 3.42 MIL/uL — ABNORMAL LOW (ref 4.22–5.81)
RDW: 16.5 % — ABNORMAL HIGH (ref 11.5–15.5)
WBC: 24.3 10*3/uL — ABNORMAL HIGH (ref 4.0–10.5)
nRBC: 0.2 % (ref 0.0–0.2)

## 2022-04-23 LAB — MAGNESIUM: Magnesium: 2.8 mg/dL — ABNORMAL HIGH (ref 1.7–2.4)

## 2022-04-23 MED ORDER — VITAL 1.5 CAL PO LIQD
1000.0000 mL | ORAL | Status: DC
  Administered 2022-04-23 – 2022-04-25 (×3): 1000 mL

## 2022-04-23 MED ORDER — INSULIN GLARGINE-YFGN 100 UNIT/ML ~~LOC~~ SOLN
25.0000 [IU] | Freq: Two times a day (BID) | SUBCUTANEOUS | Status: DC
  Administered 2022-04-23 – 2022-04-24 (×2): 25 [IU] via SUBCUTANEOUS
  Filled 2022-04-23 (×3): qty 0.25

## 2022-04-23 NOTE — Progress Notes (Signed)
[facility] Progress Note   Assessment/ Plan:   AKI/CKD stage IIIb-IV - presumably ischemic ATN in setting of acute pancreatitis, hypotension, pressor support, with concomitant ARB, SGLT-2 inhibitor and finerenone.  Initially non-oliguric but now UOP has decreased since being placed on pressors.  Started on CRRT 04/13/22.  RIJ temp HD cath placed 04/13/22.  Continue to hold losartan, [place], and [place].   Continue CRRT- increase UF to 50-100 mL/ hr Keep MAP >65 Renal dose meds for eGFR <15 mL/min.   Avoid nephrotoxic agents such as IV contrast, NSAIDs, and phosphate containing bowel preps (FLEETS).   Preferred narcotics are hydromorphone, fentanyl, and methadone.  Morphine should be avoided. Acute pancreatitis  - GI following, appreciate assistance  - likely d/t triglycerides  - ? CBD dilatation on imaging, may be worth doing MRCP but defer to expertise- newer MRI contrast agents are much safer for AKI/ ESRD than the old ones DKA- resolved  Shock - likely due to pancreatitis + cavitary pneumonia -   Repeat CT abd pelvis 8/14 with edematous pancreas but no evidence of necrotizing pancreatitis.   - on vanc/ cefepime/ flagyl/ azithro - on midodrine 15 TID Acute hypoxic respiratory failure - with underlying COPD and OSA.  Per PCCM Hep C with advanced liver fibrosis Chronic diastolic CHF - stable.  Subjective:    Seen in room.  Still looking shocky off CRRT- hasn't improved Bps a lot   Objective:   BP (!) 70/50   Pulse (!) 113   Temp (!) 97.5 F (36.4 C) (Oral)   Resp (!) 35   Ht '5\' 8"'  (1.727 m)   Wt 122.9 kg   SpO2 91%   BMI 41.20 kg/m   Physical Exam: OVP:CHEKBTC ill CVS: tachycardic Resp: tachypneic Abd: soft, obese, somewhat distended, mildly tender Ext: no LE edema ACCESS: R IJ nontunneled HD cath  Labs: BMET Recent Labs  Lab 04/20/22 0130 04/20/22 1627 04/21/22 0559 04/21/22 1643 04/22/22 0323 04/22/22 1542 04/23/22 0345  NA 134* 137 139 139  137 137 138  K 3.8 4.8 3.7 3.6 3.4* 3.8 3.7  CL 100 101 102 103 101 103 102  CO2 23 21* '24 23 23 ' 20* 26  GLUCOSE 168* 174* 211* 234* 244* 234* 204*  BUN 30* 31* 33* 35* 50* 61* 47*  CREATININE 2.81* 2.97* 2.97* 3.00* 4.35* 5.25* 4.10*  CALCIUM 9.3 9.2 9.6 9.6 9.9 9.5 9.4  PHOS 3.5 3.4 2.7 2.7 3.4 5.1* 3.6   CBC Recent Labs  Lab 04/20/22 0130 04/21/22 0559 04/22/22 0323 04/23/22 0345  WBC 23.6* 25.7*  25.6* 26.0* 24.3*  NEUTROABS  --  21.0*  --   --   HGB 10.2* 10.4*  10.2* 9.8* 9.1*  HCT 34.4* 33.7*  33.6* 32.7* 30.3*  MCV 89.1 85.5  86.6 88.4 88.6  PLT 288 342  347 348 374      Medications:     amoxicillin-clavulanate  500 mg Per Tube Q12H   arformoterol  15 mcg Nebulization BID   B-complex with vitamin C  1 tablet Per Tube Daily   budesonide (PULMICORT) nebulizer solution  0.5 mg Nebulization BID   Chlorhexidine Gluconate Cloth  6 each Topical Daily   feeding supplement  237 mL Oral TID BM   feeding supplement (VITAL 1.5 CAL)  1,000 mL Per Tube Q24H   heparin  5,000 Units Subcutaneous Q8H   insulin aspart  0-20 Units Subcutaneous Q4H   insulin aspart  8 Units Subcutaneous Q4H   insulin glargine-yfgn  25 Units Subcutaneous BID   midodrine  15 mg Per Tube Q8H   mupirocin ointment  1 Application Nasal BID   mouth rinse  15 mL Mouth Rinse 4 times per day   simethicone  80 mg Per Tube QID   sodium chloride flush  10-40 mL Intracatheter Q12H     Madelon Lips MD 04/23/2022, 12:34 PM

## 2022-04-23 NOTE — Progress Notes (Signed)
Pt placed on Bipap by RT pt tolerating well at this time. RN aware, RT will monitor.

## 2022-04-23 NOTE — Progress Notes (Signed)
[facility]  Date of Admission:  04/10/2022     Lines:  8/08-c Right ij hd cath   Abx: 8/15-c vanc 8/15-c azith 8/15-c piptazo                                                                 Assessment: 61 y.o. male alcohol abuse, dm2, copd on home o2 and cpap, ckd, admitted 8/6 for dka in shock/pancreatitis, course with leukocytosis for which id consulted     I discussed case with Dr Loanne Drilling. I agree with her no obvious source for leukocytosis, which is in discordant with how the patient is actually clinically appearing.   He does have a left upper lobe cavity which is stable with with new proximal opacity/infiltrate. I think it is reasonable to treat for pna there with oral abx for 3 weeks   In regard to the pancrea stranding on ct, I don't see a reason to presume that is infected at this time (he also has improving gi sx) and we can continue to watch  ------------------ 8/18 assessment Back on crrt yesterday Mentating much better today, but appears to be intermittent waxing waning per nursing staff Needing hf/bipap for copd/hypercarbia Wbc down Gi discomfort minimal  Remains anuric  Bcx ngtd    Plan: No change in abx recs -- would continue amox-clav; plan 3 weeks from 8/14 I do not feel strongly we need to pursue bronchoscopy if that was considered; suspect slow improvement due to copd, aki, pancreatitis. Will plan to treat typical spectrum bacteria for cavitary lung change left upper lobe; low suspicion for atypical organism process Discussed with primary team  I spent more than 35 minute reviewing data/chart, and coordinating care and >50% direct face to face time providing counseling/discussing diagnostics/treatment plan with patient  Principal Problem:   DKA (diabetic ketoacidosis) ([place]) Active Problems:   Essential hypertension   History of alcohol abuse   Acute respiratory failure with hypoxia ([place])   AKI on CKD stage  3b   GERD (gastroesophageal reflux disease)   Chronic obstructive pulmonary disease (HCC)   Acute pancreatitis   SIRS (systemic inflammatory response syndrome) (HCC)   Hypertriglyceridemia   Hypercholesterolemia   Lactic acidosis   Shock ([place])   Abnormal CT of the abdomen   Cavitary pneumonia   Leukocytosis   Bile duct abnormality   No Known Allergies  Scheduled Meds:  amoxicillin-clavulanate  500 mg Per Tube Q12H   arformoterol  15 mcg Nebulization BID   B-complex with vitamin C  1 tablet Per Tube Daily   budesonide (PULMICORT) nebulizer solution  0.5 mg Nebulization BID   Chlorhexidine Gluconate Cloth  6 each Topical Daily   feeding supplement  237 mL Oral TID BM   feeding supplement (VITAL 1.5 CAL)  1,000 mL Per Tube Q24H   heparin  5,000 Units Subcutaneous Q8H   insulin aspart  0-20 Units Subcutaneous Q4H   insulin aspart  8 Units Subcutaneous Q4H   insulin glargine-yfgn  25 Units Subcutaneous BID   midodrine  15 mg Per Tube Q8H   mupirocin ointment  1 Application Nasal BID   mouth rinse  15 mL Mouth Rinse 4 times per day   simethicone  80 mg Per Tube QID   sodium chloride flush  10-40 mL Intracatheter Q12H   Continuous Infusions:   prismasol BGK 4/2.5 400 mL/hr at 04/23/22 0423    prismasol BGK 4/2.5 400 mL/hr at 04/23/22 0423   sodium chloride Stopped (04/21/22 1243)   heparin 10,000 units/ 20 mL infusion syringe 1,000 Units/hr (04/23/22 1045)   prismasol BGK 4/2.5 1,500 mL/hr at 04/23/22 1052   PRN Meds:.sodium chloride, acetaminophen, albuterol, dextrose, gabapentin, heparin, heparin, ondansetron (ZOFRAN) IV, mouth rinse, oxyCODONE, sodium chloride flush, white petrolatum   SUBJECTIVE: Off/on bipap More alert today On crrt  Afebrile Wbc down Mild epigastric discomfort radiating to back On trickle tube feed and tolerating that    Review of Systems: ROS All other ROS was negative, except mentioned above     OBJECTIVE: Vitals:   04/23/22 1030  04/23/22 1100 04/23/22 1130 04/23/22 1200  BP: 106/78 108/84 94/71 (!) 70/50  Pulse: (!) 113 (!) 105 (!) 109 (!) 113  Resp: 18 (!) 23 (!) 27 (!) 35  Temp:  (!) 97.5 F (36.4 C)    TempSrc:  Oral    SpO2: 95% 98% 97% 91%  Weight:      Height:       Body mass index is 41.2 kg/m.  Physical Exam  General/constitutional: chronically ill appearing; on hfnc (refused bipap); alert/conversant; appropriate HEENT: Normocephalic, PER, Conj Clear CV: rrr no mrg Lungs: normal respiratory effort Abd: Soft, mild discomfort epigastric area; obese/distended Ext: no edema Skin: No Rash Neuro: generalized weakness; nonfocal MSK: no peripheral joint swelling/tenderness/warmth  Central line presence: right ij hd line site no purulence   Lab Results Lab Results  Component Value Date   WBC 24.3 (H) 04/23/2022   HGB 9.1 (L) 04/23/2022   HCT 30.3 (L) 04/23/2022   MCV 88.6 04/23/2022   PLT 374 04/23/2022    Lab Results  Component Value Date   CREATININE 4.10 (H) 04/23/2022   BUN 47 (H) 04/23/2022   NA 138 04/23/2022   K 3.7 04/23/2022   CL 102 04/23/2022   CO2 26 04/23/2022    Lab Results  Component Value Date   ALT 20 04/20/2022   AST 35 04/20/2022   ALKPHOS 103 04/20/2022   BILITOT 0.6 04/20/2022      Microbiology: Recent Results (from the past 240 hour(s))  Expectorated Sputum Assessment w Gram Stain, Rflx to Resp Cult     Status: None   Collection Time: 04/14/22  2:20 PM   Specimen: Expectorated Sputum  Result Value Ref Range Status   Specimen Description EXPECTORATED SPUTUM  Final   Special Requests NONE  Final   Sputum evaluation   Final    Sputum specimen not acceptable for testing.  Please recollect.   NOTIFIED RN LINDY HOPPER ON 04/14/22 @ 2328 BY DRT Performed at [facility], [place] [address] [place], [place] [address]    Report Status 04/14/2022 FINAL  Final  Expectorated Sputum Assessment w Gram Stain, Rflx to Resp Cult     Status: None   Collection Time:  04/15/22  7:56 PM   Specimen: Sputum  Result Value Ref Range Status   Specimen Description SPU  Final   Special Requests NONE  Final   Sputum evaluation   Final    Sputum specimen not acceptable for testing.  Please recollect.   NOTIFIED STEPHANIE VIVERITO ON 04/15/22 @ 2259 BY DRT Performed at [facility], [place] [address], [place], [place] [address]  Report Status 04/15/2022 FINAL  Final  Culture, blood (Routine X 2) w Reflex to ID Panel     Status: None (Preliminary result)   Collection Time: 04/19/22  9:30 AM   Specimen: BLOOD  Result Value Ref Range Status   Specimen Description BLOOD RIGHT ANTECUBITAL  Final   Special Requests   Final    BOTTLES DRAWN AEROBIC AND ANAEROBIC Blood Culture adequate volume   Culture   Final    NO GROWTH 4 DAYS Performed at [facility], [place] [address], [place], [place] [address]    Report Status PENDING  Incomplete  Culture, blood (Routine X 2) w Reflex to ID Panel     Status: None (Preliminary result)   Collection Time: 04/19/22  9:32 AM   Specimen: BLOOD RIGHT FOREARM  Result Value Ref Range Status   Specimen Description BLOOD RIGHT FOREARM  Final   Special Requests   Final    BOTTLES DRAWN AEROBIC AND ANAEROBIC Blood Culture adequate volume   Culture   Final    NO GROWTH 4 DAYS Performed at [facility], [place] [address], [place], [place] [address]    Report Status PENDING  Incomplete     Serology:   Imaging: If present, new imagings (plain films, ct scans, and mri) have been personally visualized and interpreted; radiology reports have been reviewed. Decision making incorporated into the Impression / Recommendations.  8/16 [place] ruq 1. Common bile duct measures 7 mm which is within expected limits given age and history of cholecystectomy, decreased from 10 mm on the prior study. No intrahepatic biliary ductal dilatation. 2. Probable fatty infiltration of the liver, unchanged.   8/14 ct chest abd pelv with  contrast Chest Impression:   1. Mild RIGHT basilar atelectasis is new from comparison CT. 2. Stable thick-walled cavitary lesion in the LEFT upper lobe. 3. Mild thickening of the esophagus.   Abdomen / Pelvis Impression:   1. Increase in fluid surrounding the pancreas compared to most recent CT. Findings consistent with acute pancreatitis. No organized fluid collections at this point. Pancreas is edematous but no evidence of necrotizing pancreatitis as the pancreas enhances. 2. Feeding tube extends in the fourth portion duodenum. 3. No evidence of bowel obstruction or bowel ischemia.     Jabier Mutton, [facility] [place] [phone] pager    04/23/2022, 12:12 PM

## 2022-04-23 NOTE — Progress Notes (Addendum)
NAME:  Mathew Lara, MRN:  [id number], DOB:  [birth year], LOS: 88 ADMISSION DATE:  04/10/2022, CONSULTATION DATE:  8/5 REFERRING MD:  Darrell Jewel, CHIEF COMPLAINT: Abdominal pain  History of Present Illness:  Mathew Lara is an 61 y.o. M who presented to [facility] on 8/5 with a chief complaint of abdominal pain.  They have a past medical history of alcohol abuse, anxiety, CKD 3, COPD, OSA, type 2 diabetes, hypertension, hyperlipidemia, hep C  ED work-up revealed DKA, acute pancreatitis, AKI.  He was admitted to the hospital service.  Overnight he developed continuing respiratory distress, shock.   PCCM was consulted for transfer to [facility] for further management.   Pertinent  Medical History  Alcohol abuse, anxiety, CKD 3, COPD, OSA, type 2 diabetes, hypertension, hyperlipidemia, hep C  Significant Hospital Events: Including procedures, antibiotic start and stop dates in addition to other pertinent events   8/5 presented to [facility] ED, transferred [facility] 8/7 on pressors  8/8 on BiPAP, worsening renal parameters 8/9 tolerating CRRT, on BiPAP 8/10 has been off BiPAP, on high flow oxygen and tolerating 8/11 UOP improving. CRRT with net even UF 8/14 CT reveals worsening cavitary pneumonia and acute pancreatitis, large BM 8/15 abx changed for cavitary pneumonia 8/16 ID consult.  ID stopped vancomycin, Zosyn, azithromycin >narrowed to augmentin. CRRT stopped 8/17 CRRT restarted  Interim History / Subjective:  Tmax 98.4  +776.3 mL admission, 1 stool past 24 hours  No drips  Subjective: Complains of back pain from his arthritis, and some tenderness in abdomen, with hiccups.   Objective   Blood pressure 100/83, pulse (!) 107, temperature 98.1 F (36.7 C), temperature source Oral, resp. rate (!) 27, height '5\' 8"'  (1.727 m), weight 122.9 kg, SpO2 99 %.    Vent Mode: BIPAP FiO2 (%):  [60 %] 60 % Set Rate:  [18 bmp] 18 bmp PEEP:  [5 cmH20] 5 cmH20    Intake/Output Summary (Last 24 hours) at 04/23/2022 1113 Last data filed at 04/23/2022 1000 Gross per 24 hour  Intake 684 ml  Output 882 ml  Net -198 ml   Filed Weights   04/21/22 0315 04/22/22 0400 04/23/22 0500  Weight: 125.9 kg 124.4 kg 122.9 kg   Physical Exam: General: In bed, NAD, appears comfortable HEENT: MM pink/moist, anicteric, atraumatic Neuro: RASS 0, PERRL, Follows commands, moves all extremites CV: S1S2, no m/r/g appreciated PULM:  air movement in all lobes, no wheezes,  GI: soft, normal bowel sounds, tenderness in periumbilical/epigastric area   Extremities: warm/dry, no pretibial edema, capillary refill less than 3 seconds  Skin:  no rashes or lesions noted  Labs/imaging WBC 25.6 > 25.7 > 26 > 24.3 Hemoglobin 10.2 > 10.4 > 9.8 > 9.1 Magnesium 3.0 > 2.8 Potassium 3.4 > 3.7 Glucose 179- 255 Creatinine 2.97 > 3.0 > 4.35 > 5.25 > 4.10 BUN 35 > 50 > 61 > 47 Blood cultures no growth to date day 4    Resolved Hospital Problem list   Multifactorial shock  Assessment & Plan:  LUL Cavitary pneumonia Paraseptal emphysema Underlying obstructive sleep apnea Hx post-infectious scarring - no ILD on CT Bcx 8/10 NGTD. CXR 8/11 no acute findings. KUB 8/12 unremarkable. Cavitary PNA on CT chest never treated. WBC 25.6 > 25.7 > 26 > 24.3. Afebrile overnight, on HFNC 30 L/min 60%. ID has low concern for fungal etiology, continue abx. -Goal SPO2 88 to 96% -Wean HFNC as tolerated -Continue Brovana, Pulmicort and as needed albuterol -  Appreciate ID assistance.  Continue Augmentin (8/14-9/4) -Follow-up blood cultures and fungal labs  Acute metabolic encephalopathy - improves with BiPAP, suspect hypercarbic failure +/- hypoactive delirium and sepsis. CT head neg -Frequent reorientation, supportive management -Recommend BiPAP with nap and a night  Multifactorial shock-resolved -Continue midodrine 15 mg 3 times daily -Continue antibiotics as above  Acute kidney injury on  chronic kidney disease 2/2 ATN in setting of acute pancreatitis and shock CRRT started 8/17. Cr improved to 4.10 (5.25) -Appreciate nephrology assistance -Goal MAP 65 to maintain renal perfusion -CRRT per nephrology -Avoid nephrotoxic medications -Renally dose medications for eGFR < 15 mL/min  Acute pancreatitis 2/2 hypertriglyceridemia, choledocholithiasis Fatty liver with fibrosis History of hepatitis C with advanced liver fibrosis Lipase normalized, improving TG. RUQ [place] 04/10/22 - CBD dilatation 10.8 mm. Ultrasound abdomen 8/16: Common bile duct measures 7 mm decreased from 10 mm, fatty liver.  GI feels that ultrasound shows normalizing of the CBD, thus may have past gallstone. Per GI, not a candidate for MRCP. -Trickle tube feeding at 30 cc/hr -Continue pain management with Tylenol 650 mg every 6 hours as needed and oxycodone 10 mg every 6 hours as needed  Type 2 diabetes DKA - resolved Glucose 184- 234 -Continue Semglee 25 units twice daily  -Continue sliding scale resistant with tube feed coverage  History of diastolic heart failure -Echo 12/25/2019 revealed ejection fraction of 60 to 65% -Continue telemetry  Constipation - improving Bowel movement overnight.  Status post methylnaltrexone on 8/14 and smog enema -Bowel regimen -Trickle feeds -Monitor stool output -Minimize narcotics  Hiccups -Continue gabapentin 100 mg PRN  Best Practice (right click and "Reselect all SmartList Selections" daily)   Diet/type: tubefeeds and clear liquids DVT prophylaxis: prophylactic heparin  GI prophylaxis: N/A Lines: Dialysis Catheter and yes and it is still needed Foley:  N/A Code Status:  full code Last date of multidisciplinary goals of care discussion [pending] Updated daughter on 04/21/22 on plan above. Will call today.  Critical care time: 39 minutes  Holley Bouche, MD PGY-2 [facility] 04/23/2022 , 11:13 AM  Please see [email or web address] for pager details  If no response, please call  [phone] After hours, please call Elink at [phone]

## 2022-04-24 DIAGNOSIS — K851 Biliary acute pancreatitis without necrosis or infection: Secondary | ICD-10-CM | POA: Diagnosis not present

## 2022-04-24 DIAGNOSIS — E1110 Type 2 diabetes mellitus with ketoacidosis without coma: Secondary | ICD-10-CM | POA: Diagnosis not present

## 2022-04-24 DIAGNOSIS — N179 Acute kidney failure, unspecified: Secondary | ICD-10-CM | POA: Diagnosis not present

## 2022-04-24 DIAGNOSIS — J9601 Acute respiratory failure with hypoxia: Secondary | ICD-10-CM | POA: Diagnosis not present

## 2022-04-24 LAB — RENAL FUNCTION PANEL
Albumin: 2.3 g/dL — ABNORMAL LOW (ref 3.5–5.0)
Albumin: 2.3 g/dL — ABNORMAL LOW (ref 3.5–5.0)
Anion gap: 14 (ref 5–15)
Anion gap: 12 (ref 5–15)
BUN: 31 mg/dL — ABNORMAL HIGH (ref 8–23)
BUN: 29 mg/dL — ABNORMAL HIGH (ref 8–23)
CO2: 21 mmol/L — ABNORMAL LOW (ref 22–32)
CO2: 25 mmol/L (ref 22–32)
Calcium: 9.6 mg/dL (ref 8.9–10.3)
Calcium: 9.6 mg/dL (ref 8.9–10.3)
Chloride: 100 mmol/L (ref 98–111)
Chloride: 99 mmol/L (ref 98–111)
Creatinine, Ser: 3.33 mg/dL — ABNORMAL HIGH (ref 0.61–1.24)
Creatinine, Ser: 3.06 mg/dL — ABNORMAL HIGH (ref 0.61–1.24)
GFR, Estimated: 20 mL/min — ABNORMAL LOW (ref >60)
GFR, Estimated: 22 mL/min — ABNORMAL LOW (ref >60)
Glucose, Bld: 190 mg/dL — ABNORMAL HIGH (ref 70–99)
Glucose, Bld: 228 mg/dL — ABNORMAL HIGH (ref 70–99)
Phosphorus: 3.4 mg/dL (ref 2.5–4.6)
Phosphorus: 2.9 mg/dL (ref 2.5–4.6)
Potassium: 4.3 mmol/L (ref 3.5–5.1)
Potassium: 4.6 mmol/L (ref 3.5–5.1)
Sodium: 135 mmol/L (ref 135–145)
Sodium: 136 mmol/L (ref 135–145)

## 2022-04-24 LAB — GLUCOSE, CAPILLARY
Glucose-Capillary: 153 mg/dL — ABNORMAL HIGH (ref 70–99)
Glucose-Capillary: 158 mg/dL — ABNORMAL HIGH (ref 70–99)
Glucose-Capillary: 185 mg/dL — ABNORMAL HIGH (ref 70–99)
Glucose-Capillary: 197 mg/dL — ABNORMAL HIGH (ref 70–99)
Glucose-Capillary: 200 mg/dL — ABNORMAL HIGH (ref 70–99)
Glucose-Capillary: 213 mg/dL — ABNORMAL HIGH (ref 70–99)

## 2022-04-24 LAB — CBC
HCT: 32.2 % — ABNORMAL LOW (ref 39.0–52.0)
Hemoglobin: 9.6 g/dL — ABNORMAL LOW (ref 13.0–17.0)
MCH: 26.4 pg (ref 26.0–34.0)
MCHC: 29.8 g/dL — ABNORMAL LOW (ref 30.0–36.0)
MCV: 88.7 fL (ref 80.0–100.0)
Platelets: 355 10*3/uL (ref 150–400)
RBC: 3.63 MIL/uL — ABNORMAL LOW (ref 4.22–5.81)
RDW: 16.3 % — ABNORMAL HIGH (ref 11.5–15.5)
WBC: 22 10*3/uL — ABNORMAL HIGH (ref 4.0–10.5)
nRBC: 0.4 % — ABNORMAL HIGH (ref 0.0–0.2)

## 2022-04-24 LAB — CORTISOL: Cortisol, Plasma: 19.4 ug/dL

## 2022-04-24 LAB — CULTURE, BLOOD (ROUTINE X 2)
Culture: NO GROWTH
Culture: NO GROWTH
Special Requests: ADEQUATE
Special Requests: ADEQUATE

## 2022-04-24 LAB — TRIGLYCERIDES: Triglycerides: 358 mg/dL — ABNORMAL HIGH (ref <150)

## 2022-04-24 LAB — MAGNESIUM: Magnesium: 2.6 mg/dL — ABNORMAL HIGH (ref 1.7–2.4)

## 2022-04-24 MED ORDER — INSULIN GLARGINE-YFGN 100 UNIT/ML ~~LOC~~ SOLN
30.0000 [IU] | Freq: Two times a day (BID) | SUBCUTANEOUS | Status: DC
  Administered 2022-04-24 – 2022-04-25 (×2): 30 [IU] via SUBCUTANEOUS
  Filled 2022-04-24 (×3): qty 0.3

## 2022-04-24 MED ORDER — PROMETHAZINE HCL 25 MG/ML IJ SOLN
12.5000 mg | Freq: Once | INTRAMUSCULAR | Status: AC
  Administered 2022-04-24: 12.5 mg via INTRAVENOUS
  Filled 2022-04-24: qty 12.5

## 2022-04-24 MED ORDER — ALTEPLASE 2 MG IJ SOLR
2.0000 mg | Freq: Once | INTRAMUSCULAR | Status: AC
Start: 2022-04-25 — End: 2022-04-24
  Administered 2022-04-24: 2 mg via INTRAVENOUS_CENTRAL
  Filled 2022-04-24: qty 2

## 2022-04-24 MED ORDER — ALUM & MAG HYDROXIDE-SIMETH 200-200-20 MG/5ML PO SUSP
30.0000 mL | Freq: Once | ORAL | Status: AC
  Administered 2022-04-24: 30 mL via ORAL
  Filled 2022-04-24: qty 30

## 2022-04-24 MED ORDER — ALTEPLASE 2 MG IJ SOLR
2.0000 mg | Freq: Once | INTRAMUSCULAR | Status: AC
  Administered 2022-04-24: 2 mg via INTRAVENOUS_CENTRAL
  Filled 2022-04-24: qty 2

## 2022-04-24 NOTE — Progress Notes (Signed)
[facility] Progress Note   Assessment/ Plan:   AKI/CKD stage IIIb-IV - presumably ischemic ATN in setting of acute pancreatitis, hypotension, pressor support, with concomitant ARB, SGLT-2 inhibitor and finerenone.  Initially non-oliguric but now UOP has decreased since being placed on pressors.  Started on CRRT 04/13/22.  RIJ temp HD cath placed 04/13/22.  Continue to hold losartan, Jardiance, and [place].   Continue CRRT- UF 50-100 mL/ hr Keep MAP >65 Renal dose meds for eGFR <15 mL/min.   Avoid nephrotoxic agents such as IV contrast, NSAIDs, and phosphate containing bowel preps (FLEETS).   Preferred narcotics are hydromorphone, fentanyl, and methadone.  Morphine should be avoided. Acute pancreatitis  - GI following, appreciate assistance  - likely d/t triglycerides  - ? CBD dilatation on imaging, may be worth doing MRCP but defer to expertise- newer MRI contrast agents are much safer for AKI/ ESRD than the old ones DKA- resolved  Shock - likely due to pancreatitis + cavitary pneumonia -   Repeat CT abd pelvis 8/14 with edematous pancreas but no evidence of necrotizing pancreatitis.    - s/p vanc/ cefepime/ flagyl/ azithro--> augmentin - on midodrine 15 TID Acute hypoxic respiratory failure - with underlying COPD and OSA.  Per PCCM Hep C with advanced liver fibrosis Chronic diastolic CHF - stable.  Subjective:    Seen in room. Feels better than yesterday.  WBC coming down.  Remains on CRRT   Objective:   BP 91/67 (BP Location: Left Wrist)   Pulse (!) 107   Temp 98.1 F (36.7 C) (Oral)   Resp (!) 25   Ht _0  (1.727 m)   Wt 121.9 kg   SpO2 91%   BMI 40.86 kg/m   Physical Exam: KKX:FGHWEXH better than yesterday CVS: tachycardic Resp: tachypneic Abd: soft, obese, somewhat distended, mildly tender Ext: no LE edema ACCESS: R IJ nontunneled HD cath  Labs: BMET Recent Labs  Lab 04/21/22 0559 04/21/22 1643 04/22/22 0323 04/22/22 1542 04/23/22 0345  04/23/22 1953 04/24/22 0547  NA 139 139 137 137 138 137 135  K 3.7 3.6 3.4* 3.8 3.7 4.2 4.3  CL 102 103 101 103 102 101 100  CO2 _1 20* 26 23 21*  GLUCOSE 211* 234* 244* 234* 204* 183* 190*  BUN 33* 35* 50* 61* 47* 37* 31*  CREATININE 2.97* 3.00* 4.35* 5.25* 4.10* 3.51* 3.33*  CALCIUM 9.6 9.6 9.9 9.5 9.4 9.6 9.6  PHOS 2.7 2.7 3.4 5.1* 3.6 3.8 3.4   CBC Recent Labs  Lab 04/21/22 0559 04/22/22 0323 04/23/22 0345 04/24/22 0547  WBC 25.7*  25.6* 26.0* 24.3* 22.0*  NEUTROABS 21.0*  --   --   --   HGB 10.4*  10.2* 9.8* 9.1* 9.6*  HCT 33.7*  33.6* 32.7* 30.3* 32.2*  MCV 85.5  86.6 88.4 88.6 88.7  PLT 342  347 348 374 355      Medications:     amoxicillin-clavulanate  500 mg Per Tube Q12H   arformoterol  15 mcg Nebulization BID   B-complex with vitamin C  1 tablet Per Tube Daily   budesonide (PULMICORT) nebulizer solution  0.5 mg Nebulization BID   Chlorhexidine Gluconate Cloth  6 each Topical Daily   feeding supplement  237 mL Oral TID BM   feeding supplement (VITAL 1.5 CAL)  1,000 mL Per Tube Q24H   heparin  5,000 Units Subcutaneous Q8H   insulin aspart  0-20 Units Subcutaneous Q4H   insulin aspart  8 Units Subcutaneous  Q4H   insulin glargine-yfgn  25 Units Subcutaneous BID   midodrine  15 mg Per Tube Q8H   mupirocin ointment  1 Application Nasal BID   mouth rinse  15 mL Mouth Rinse 4 times per day   simethicone  80 mg Per Tube QID   sodium chloride flush  10-40 mL Intracatheter Q12H     Madelon Lips MD 04/24/2022, 11:24 AM

## 2022-04-24 NOTE — Progress Notes (Signed)
NAME:  Mathew Lara, MRN:  [id number], DOB:  Nov 09, 1960, LOS: 64 ADMISSION DATE:  04/10/2022, CONSULTATION DATE:  8/5 REFERRING MD:  Darrell Jewel, CHIEF COMPLAINT: Abdominal pain  History of Present Illness:  Mathew Lara is an 61 y.o. M who presented to [facility] ED on 8/5 with a chief complaint of abdominal pain.  They have a past medical history of alcohol abuse, anxiety, CKD 3, COPD, OSA, type 2 diabetes, hypertension, hyperlipidemia, hep C+  ED work-up revealed DKA, acute pancreatitis, AKI.  He was admitted to the hospital service.  Overnight 8/5 he developed continuing respiratory distress, shock.   PCCM was consulted for transfer to [facility] for further management.   Pertinent  Medical History  Alcohol abuse, anxiety, CKD 3, COPD, OSA, type 2 diabetes, hypertension, hyperlipidemia, hep C  Significant Hospital Events: Including procedures, antibiotic start and stop dates in addition to other pertinent events   8/5 Presented to [facility] ED, transferred [facility] 8/7 On pressors  8/8 BiPAP, worsening renal parameters 8/9 Tolerating CRRT, on BiPAP 8/10 has been off BiPAP, on high flow oxygen and tolerating 8/11 UOP improving. CRRT with net even UF 8/14 CT reveals worsening cavitary pneumonia and acute pancreatitis, large BM 8/15 abx changed for cavitary pneumonia 8/16 ID consult.  ID stopped vancomycin, Zosyn, azithromycin >narrowed to augmentin. CRRT stopped 8/17 CRRT restarted  Interim History / Subjective:  Afebrile  On CRRT  Remains off vasopressors > 48 hours  Denies complaints this am Glucose range 153-200 I/O 2.3L removed with CRRT, -2L in last 24 hours  Objective   Blood pressure 106/80, pulse (!) 107, temperature 98.1 F (36.7 C), temperature source Oral, resp. rate 17, height '5\' 8"'$  (1.727 m), weight 121.9 kg, SpO2 96 %.    Vent Mode: BIPAP;PCV FiO2 (%):  [40 %-60 %] 40 % Set Rate:  [18 bmp] 18 bmp PEEP:  [5 cmH20] 5 cmH20   Intake/Output  Summary (Last 24 hours) at 04/24/2022 0843 Last data filed at 04/24/2022 0800 Gross per 24 hour  Intake 403.67 ml  Output 2477 ml  Net -2073.33 ml   Filed Weights   04/22/22 0400 04/23/22 0500 04/24/22 0500  Weight: 124.4 kg 122.9 kg 121.9 kg   Physical Exam: General: adult male lying in bed in NAD, on CRRT    HEENT: MM pink/moist, anicteric Neuro: Awakens to voice, speech clear, MAE  CV: s1s2 RRR, no m/r/g PULM: non-labored at rest, lungs bilaterally clear GI: soft, bsx4 active  Extremities: warm/dry, residual trace to 1+ edema  Skin: no rashes or lesions   Resolved Hospital Problem list   Multifactorial shock  Assessment & Plan:   LUL Cavitary Pneumonia Paraseptal Emphysema Underlying OSA Hx post-infectious scarring - no ILD on CT BC negative. Cavitary PNA on CT chest never treated. Has been on/off bipap. Now transitioned to [facility]. ID has low concern for fungal etiology. Given his ETOH hx, ? If this was a polymicrobial aspiration event -HHFNC, wean for sats 88-94% -continue brovana, pulmicort, PRN albuterol  -appreciate ID assistance -continue augmentin (8/14-9/4) -cultures negative to date  -follow intermittent CXR -PRN BiPAP  Acute metabolic encephalopathy  Improved with BiPAP, suspect hypercarbic failure +/- hypoactive delirium and sepsis. CT head neg. -frequent reorientation -supportive care  -PT efforts   Multifactorial Shock  Shock resolved, on midodrine -midodrine '15mg'$  TID  -abx as above  -check cortisol   AKI on CKD 2/2 ATN in setting of acute pancreatitis and shock CRRT started 8/17. Cr improved  to 4.10 (5.25) -appreciate Nephrology  -continue CRRT, BP limiting factor for transition to intermittent  -Trend BMP / urinary output -Replace electrolytes as indicated -Avoid nephrotoxic agents, ensure adequate renal perfusion -renal dose medications  Acute Pancreatitis 2/2 hypertriglyceridemia, choledocholithiasis Fatty liver with fibrosis History of  hepatitis C+ with advanced liver fibrosis Lipase normalized, improving TG. RUQ [place] 04/10/22 - CBD dilatation 10.8 mm. Ultrasound abdomen 8/16: Common bile duct measures 7 mm decreased from 10 mm, fatty liver.  GI feels that ultrasound shows normalizing of the CBD, thus may have past gallstone. Per GI, not a candidate for MRCP. -diet as tolerated  -PRN tylenol & oxycodone for pain   DM II  DKA - resolved -continue SSI, resistant scale  -TF coverage 8 units Q4  -continue semglee 25 units BID   History of diastolic heart failure Echo 12/25/2019 revealed ejection fraction of 60 to 65% -Tele monitoring   Constipation - improving Bowel movement overnight.  Status post methylnaltrexone on 8/14 and smog enema -continue bowel regimen -follow stool output  -trickle TF + PO  -minimize narcotics as able   Hiccups -gabapentin 100 mg TID PRN   Best Practice (right click and "Reselect all SmartList Selections" daily)  Diet/type: tubefeeds and clear liquids DVT prophylaxis: prophylactic heparin  GI prophylaxis: N/A Lines: Dialysis Catheter and yes and it is still needed Foley:  N/A Code Status:  full code Last date of multidisciplinary goals of care discussion: Family updated at bedside 8/19 on plan of care   Critical care time: 31 minutes   Mathew Gens, MSN, APRN, NP-C, AGACNP-BC [facility] 04/24/2022, 1:34 PM   Please see [email or web address] for pager details.   From 7A-7P if no response, please call [phone] After hours, please call ELink [phone]

## 2022-04-25 DIAGNOSIS — K851 Biliary acute pancreatitis without necrosis or infection: Secondary | ICD-10-CM | POA: Diagnosis not present

## 2022-04-25 DIAGNOSIS — E1110 Type 2 diabetes mellitus with ketoacidosis without coma: Secondary | ICD-10-CM | POA: Diagnosis not present

## 2022-04-25 DIAGNOSIS — J9601 Acute respiratory failure with hypoxia: Secondary | ICD-10-CM | POA: Diagnosis not present

## 2022-04-25 DIAGNOSIS — J189 Pneumonia, unspecified organism: Secondary | ICD-10-CM | POA: Diagnosis not present

## 2022-04-25 LAB — CBC
HCT: 29.6 % — ABNORMAL LOW (ref 39.0–52.0)
Hemoglobin: 9 g/dL — ABNORMAL LOW (ref 13.0–17.0)
MCH: 26.9 pg (ref 26.0–34.0)
MCHC: 30.4 g/dL (ref 30.0–36.0)
MCV: 88.4 fL (ref 80.0–100.0)
Platelets: 322 10*3/uL (ref 150–400)
RBC: 3.35 MIL/uL — ABNORMAL LOW (ref 4.22–5.81)
RDW: 16 % — ABNORMAL HIGH (ref 11.5–15.5)
WBC: 21.6 10*3/uL — ABNORMAL HIGH (ref 4.0–10.5)
nRBC: 0.4 % — ABNORMAL HIGH (ref 0.0–0.2)

## 2022-04-25 LAB — GLUCOSE, CAPILLARY
Glucose-Capillary: 207 mg/dL — ABNORMAL HIGH (ref 70–99)
Glucose-Capillary: 208 mg/dL — ABNORMAL HIGH (ref 70–99)
Glucose-Capillary: 257 mg/dL — ABNORMAL HIGH (ref 70–99)
Glucose-Capillary: 216 mg/dL — ABNORMAL HIGH (ref 70–99)
Glucose-Capillary: 262 mg/dL — ABNORMAL HIGH (ref 70–99)
Glucose-Capillary: 199 mg/dL — ABNORMAL HIGH (ref 70–99)

## 2022-04-25 LAB — RENAL FUNCTION PANEL
Albumin: 2.3 g/dL — ABNORMAL LOW (ref 3.5–5.0)
Anion gap: 12 (ref 5–15)
BUN: 33 mg/dL — ABNORMAL HIGH (ref 8–23)
CO2: 24 mmol/L (ref 22–32)
Calcium: 9.6 mg/dL (ref 8.9–10.3)
Chloride: 98 mmol/L (ref 98–111)
Creatinine, Ser: 3.86 mg/dL — ABNORMAL HIGH (ref 0.61–1.24)
GFR, Estimated: 17 mL/min — ABNORMAL LOW (ref >60)
Glucose, Bld: 212 mg/dL — ABNORMAL HIGH (ref 70–99)
Phosphorus: 3.8 mg/dL (ref 2.5–4.6)
Potassium: 4.7 mmol/L (ref 3.5–5.1)
Sodium: 134 mmol/L — ABNORMAL LOW (ref 135–145)

## 2022-04-25 LAB — MAGNESIUM: Magnesium: 2.9 mg/dL — ABNORMAL HIGH (ref 1.7–2.4)

## 2022-04-25 MED ORDER — INSULIN GLARGINE-YFGN 100 UNIT/ML ~~LOC~~ SOLN
35.0000 [IU] | Freq: Two times a day (BID) | SUBCUTANEOUS | Status: DC
  Administered 2022-04-25 – 2022-04-26 (×2): 35 [IU] via SUBCUTANEOUS
  Filled 2022-04-25 (×3): qty 0.35

## 2022-04-25 MED ORDER — INSULIN ASPART 100 UNIT/ML IJ SOLN
10.0000 [IU] | INTRAMUSCULAR | Status: DC
  Administered 2022-04-25 – 2022-04-27 (×12): 10 [IU] via SUBCUTANEOUS

## 2022-04-25 MED ORDER — CALCIUM CARBONATE ANTACID 500 MG PO CHEW
1.0000 | CHEWABLE_TABLET | Freq: Once | ORAL | Status: AC
  Administered 2022-04-25: 200 mg via ORAL
  Filled 2022-04-25: qty 1

## 2022-04-25 MED ORDER — OXYCODONE HCL 5 MG PO TABS
5.0000 mg | ORAL_TABLET | Freq: Three times a day (TID) | ORAL | Status: DC | PRN
  Administered 2022-04-25 – 2022-04-27 (×4): 5 mg
  Filled 2022-04-25 (×4): qty 1

## 2022-04-25 NOTE — Progress Notes (Signed)
[facility] Progress Note   Assessment/ Plan:   AKI/CKD stage IIIb-IV - presumably ischemic ATN in setting of acute pancreatitis, hypotension, pressor support, with concomitant ARB, SGLT-2 inhibitor and finerenone.  Initially non-oliguric but now UOP has decreased since being placed on pressors.  Started on CRRT 04/13/22.  RIJ temp HD cath placed 04/13/22.  Continue to hold losartan, Jardiance, and [place].   Keep even, will stop today at 1700 and follow pressures/ Cr/ electrolytes for either re-initation or transiton to IHD Keep MAP >65 Renal dose meds for eGFR <15 mL/min.   Avoid nephrotoxic agents such as IV contrast, NSAIDs, and phosphate containing bowel preps (FLEETS).   Preferred narcotics are hydromorphone, fentanyl, and methadone.  Morphine should be avoided. 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    - s/p vanc/ cefepime/ flagyl/ azithro--> augmentin - on midodrine 15 TID Acute hypoxic respiratory failure - with underlying COPD and OSA.  Per PCCM Hep C with advanced liver fibrosis Chronic diastolic CHF - stable.  Subjective:    Off pressor, still soft pressures,  Hiccups this AM   Objective:   BP 109/78   Pulse 100   Temp 98.5 F (36.9 C) (Oral)   Resp 19   Ht '5\' 8"'  (1.727 m)   Wt 119.7 kg   SpO2 94%   BMI 40.12 kg/m   Physical Exam: XNA:TFTDDUK better than yesterday CVS: tachycardic Resp: tachypneic Abd: soft, obese, somewhat distended, mildly tender Ext: no LE edema ACCESS: R IJ nontunneled HD cath  Labs: BMET Recent Labs  Lab 04/22/22 0323 04/22/22 1542 04/23/22 0345 04/23/22 1953 04/24/22 0547 04/24/22 1551 04/25/22 0255  NA 137 137 138 137 135 136 134*  K 3.4* 3.8 3.7 4.2 4.3 4.6 4.7  CL 101 103  102 101 100 99 98  CO2 23 20* 26 23 21* 25 24  GLUCOSE 244* 234* 204* 183* 190* 228* 212*  BUN 50* 61* 47* 37* 31* 29* 33*  CREATININE 4.35* 5.25* 4.10* 3.51* 3.33* 3.06* 3.86*  CALCIUM 9.9 9.5 9.4 9.6 9.6 9.6 9.6  PHOS 3.4 5.1* 3.6 3.8 3.4 2.9 3.8   CBC Recent Labs  Lab 04/21/22 0559 04/22/22 0323 04/23/22 0345 04/24/22 0547 04/25/22 0255  WBC 25.7*  25.6* 26.0* 24.3* 22.0* 21.6*  NEUTROABS 21.0*  --   --   --   --   HGB 10.4*  10.2* 9.8* 9.1* 9.6* 9.0*  HCT 33.7*  33.6* 32.7* 30.3* 32.2* 29.6*  MCV 85.5  86.6 88.4 88.6 88.7 88.4  PLT 342  347 348 374 355 322      Medications:     amoxicillin-clavulanate  500 mg Per Tube Q12H   arformoterol  15 mcg Nebulization BID   B-complex with vitamin C  1 tablet Per Tube Daily   budesonide (PULMICORT) nebulizer solution  0.5 mg Nebulization BID   Chlorhexidine Gluconate Cloth  6 each Topical Daily   feeding supplement  237 mL Oral TID BM   feeding supplement (VITAL 1.5 CAL)  1,000 mL Per Tube Q24H   heparin  5,000 Units Subcutaneous Q8H   insulin aspart  0-20 Units Subcutaneous Q4H   insulin aspart  8 Units Subcutaneous Q4H   insulin glargine-yfgn  30 Units Subcutaneous BID   midodrine  15 mg Per Tube Q8H   mouth rinse  15 mL Mouth Rinse 4 times per day   simethicone  80 mg Per Tube QID   sodium chloride flush  10-40 mL Intracatheter Q12H     Madelon Lips MD 04/25/2022, 10:35 AM

## 2022-04-25 NOTE — Inpatient Diabetes Management (Signed)
Inpatient Diabetes Program Recommendations  AACE/ADA: New Consensus Statement on Inpatient Glycemic Control (2015)  Target Ranges:  Prepandial:   less than 140 mg/dL      Peak postprandial:   less than 180 mg/dL (1-2 hours)      Critically ill patients:  140 - 180 mg/dL   Lab Results  Component Value Date   GLUCAP 208 (H) 04/25/2022   HGBA1C 12.5 (H) 04/10/2022    Review of Glycemic Control  Latest Reference Range & Units 04/24/22 08:21 04/24/22 11:49 04/24/22 15:42 04/24/22 19:31 04/24/22 23:33 04/25/22 03:19 04/25/22 07:35 04/25/22 11:12  Glucose-Capillary 70 - 99 mg/dL 185 (H) 200 (H) 197 (H) 213 (H) 158 (H) 207 (H) 208 (H) 257 (H)   Current orders for Inpatient glycemic control:  Novolog 0-20 units Q4H Novolog 5 units Q4H tube feed coverage Semglee 30 units BID  Inpatient Diabetes Program Recommendations:    -   Increase Tube Feed Coverage to Novolog 10 units Q4 hours  Will continue to follow while inpatient.  Thank you, Tama Headings RN, MSN, BC-ADM Inpatient Diabetes Coordinator Team Pager [phone] (8a-5p)

## 2022-04-25 NOTE — Progress Notes (Signed)
NAME:  Mathew Lara, MRN:  [id number], DOB:  1961/03/27, LOS: 16 ADMISSION DATE:  04/10/2022, CONSULTATION DATE:  8/5 REFERRING MD:  Darrell Jewel, CHIEF COMPLAINT: Abdominal pain  History of Present Illness:  Mathew Lara is an 61 y.o. M who presented to [facility] on 8/5 with a chief complaint of abdominal pain.  They have a past medical history of alcohol abuse, anxiety, CKD 3, COPD, OSA, type 2 diabetes, hypertension, hyperlipidemia, hep C+  ED work-up revealed DKA, acute pancreatitis, AKI.  He was admitted to the hospital service.  Overnight 8/5 he developed continuing respiratory distress, shock.   PCCM was consulted for transfer to [facility] for further management.   Pertinent  Medical History  Alcohol abuse, anxiety, CKD 3, COPD, OSA, type 2 diabetes, hypertension, hyperlipidemia, hep C  Significant Hospital Events: Including procedures, antibiotic start and stop dates in addition to other pertinent events   8/5 Presented to [facility] ED, transferred [facility] 8/7 On pressors  8/8 BiPAP, worsening renal parameters 8/9 Tolerating CRRT, on BiPAP 8/10 has been off BiPAP, on high flow oxygen and tolerating 8/11 UOP improving. CRRT with net even UF 8/14 CT reveals worsening cavitary pneumonia and acute pancreatitis, large BM 8/15 abx changed for cavitary pneumonia 8/16 ID consult.  ID stopped vancomycin, Zosyn, azithromycin >narrowed to augmentin. CRRT stopped.   8/17 CRRT restarted.  Off pressors 8/19 Remains off pressors, CRRT ongoing. HD cath required tPA.   Interim History / Subjective:  Afebrile On CRRT, catheter issues yesterday, required tPA.  Issues with soft BP overnight, kept even, BP resolved  I/O1.9L removed with CRRT, -9101m for last 24h/-6.2L for admit O2 weaned to 40% FiO2/35L flow  Objective   Blood pressure 113/81, pulse (!) 106, temperature 98.5 F (36.9 C), temperature source Oral, resp. rate (!) 22, height '5\' 8"'$  (1.727 m), weight 119.7  kg, SpO2 97 %. CVP:  [4 mmHg-8 mmHg] 5 mmHg  Vent Mode: BIPAP;PCV FiO2 (%):  [50 %] 50 % Set Rate:  [18 bmp] 18 bmp PEEP:  [5 cmH20] 5 cmH20   Intake/Output Summary (Last 24 hours) at 04/25/2022 0840 Last data filed at 04/25/2022 0800 Gross per 24 hour  Intake 1096.5 ml  Output 1956 ml  Net -859.5 ml   Filed Weights   04/23/22 0500 04/24/22 0500 04/25/22 0500  Weight: 122.9 kg 121.9 kg 119.7 kg   Physical Exam: General: adult male lying in bed in NAD, on CRRT HEENT: MM pink/moist, poor dentition, R IJ HD cath, anicteric  Neuro: AAOx4, speech clear, MAE  CV: s1s2 RRR, no m/r/g PULM: non-labored at rest, lungs bilaterally with good air entry  GI: soft, bsx4 active, cortrak in place   Extremities: warm/dry, no edema  Skin: no rashes or lesions  Resolved Hospital Problem list   Multifactorial shock  Assessment & Plan:   LUL Cavitary Pneumonia Paraseptal Emphysema Underlying OSA Hx post-infectious scarring - no ILD on CT BC negative. Cavitary PNA on CT chest never treated. Has been on/off bipap. [facility] has low concern for fungal etiology. Given his ETOH hx, ? If this was a polymicrobial aspiration event -wean HHFNC for sats 88-94% -pulmonary hygiene -IS, mobilize -brovana, pulmicort + PRN albuterol  -appreciate ID  -augmentin (8/14-9/4) -QHS & PRN daytime sleep bipap  Acute Metabolic Encephalopathy  Improved with BiPAP, suspect hypercarbic failure +/- hypoactive delirium and sepsis. CT head neg. -PT efforts  -supportive care  -frequent reorientation, promote sleep / wake cycle  Multifactorial Shock  Shock resolved, on midodrine -midodrine '15mg'$  TID -cortisol 19.4 on 8/19  -abx as above   AKI on CKD 2/2 ATN in setting of acute pancreatitis and shock CRRT started 8/17. Cr improved to 4.10 (5.25) -appreciate Nephrology  -CRRT for volume removal, BP has been limiting factor for transition  -Trend BMP / urinary output -Replace electrolytes as  indicated -Avoid nephrotoxic agents, ensure adequate renal perfusion, renal dose medications  Acute Pancreatitis 2/2 hypertriglyceridemia, choledocholithiasis Fatty liver with fibrosis History of hepatitis C+ with advanced liver fibrosis Lipase normalized, improving TG. RUQ [place] 04/10/22 - CBD dilatation 10.8 mm. Ultrasound abdomen 8/16: Common bile duct measures 7 mm decreased from 10 mm, fatty liver.  GI feels that ultrasound shows normalizing of the CBD, thus may have past gallstone. Per GI, not a candidate for MRCP. -diet as tolerated  -PRN tylenol  -reduce oxycodone dosing > concern this is contributing to periods of obstructive physiology, hypotension and drowsiness  DM II  DKA - resolved -SSI, resistant scale -increase TF coverage to 10 units Q4  -Semglee 30 units BID (note he missed am dose of long acting on 8/19), consider increase 8/21 if remains elevated -Glucose range 158-212  History of diastolic heart failure Echo 12/25/2019 revealed ejection fraction of 60 to 65% -tele monitoring   Constipation - improving Bowel movement overnight.  Status post methylnaltrexone on 8/14 and smog enema -bowel regimen  -trickle TF & PO as tolerated  -minimize narcotics as able   Hiccups -gabapentin 100 mg TID PRN  Best Practice (right click and "Reselect all SmartList Selections" daily)  Diet/type: tubefeeds and clear liquids DVT prophylaxis: prophylactic heparin  GI prophylaxis: N/A Lines: Dialysis Catheter and yes and it is still needed Foley:  N/A Code Status:  full code Last date of multidisciplinary goals of care discussion: Family updated at bedside 8/19 on plan of care.  Patient updated on 8/20.   Critical care time: 30 minutes   Noe Gens, MSN, APRN, NP-C, AGACNP-BC [place] [facility] 04/25/2022, 8:40 AM   Please see [email or web address] for pager details.   From 7A-7P if no response, please call [phone] After hours, please call ELink [phone]

## 2022-04-26 ENCOUNTER — Inpatient Hospital Stay (HOSPITAL_COMMUNITY): Payer: Medicare Other

## 2022-04-26 DIAGNOSIS — J189 Pneumonia, unspecified organism: Secondary | ICD-10-CM | POA: Diagnosis not present

## 2022-04-26 DIAGNOSIS — E1110 Type 2 diabetes mellitus with ketoacidosis without coma: Secondary | ICD-10-CM | POA: Diagnosis not present

## 2022-04-26 DIAGNOSIS — J9601 Acute respiratory failure with hypoxia: Secondary | ICD-10-CM | POA: Diagnosis not present

## 2022-04-26 DIAGNOSIS — K851 Biliary acute pancreatitis without necrosis or infection: Secondary | ICD-10-CM | POA: Diagnosis not present

## 2022-04-26 DIAGNOSIS — N179 Acute kidney failure, unspecified: Secondary | ICD-10-CM | POA: Diagnosis not present

## 2022-04-26 LAB — RENAL FUNCTION PANEL
Albumin: 2.3 g/dL — ABNORMAL LOW (ref 3.5–5.0)
Albumin: 2.3 g/dL — ABNORMAL LOW (ref 3.5–5.0)
Anion gap: 16 — ABNORMAL HIGH (ref 5–15)
Anion gap: 13 (ref 5–15)
BUN: 44 mg/dL — ABNORMAL HIGH (ref 8–23)
BUN: 26 mg/dL — ABNORMAL HIGH (ref 8–23)
CO2: 20 mmol/L — ABNORMAL LOW (ref 22–32)
CO2: 26 mmol/L (ref 22–32)
Calcium: 9.7 mg/dL (ref 8.9–10.3)
Calcium: 8.9 mg/dL (ref 8.9–10.3)
Chloride: 97 mmol/L — ABNORMAL LOW (ref 98–111)
Chloride: 93 mmol/L — ABNORMAL LOW (ref 98–111)
Creatinine, Ser: 5.4 mg/dL — ABNORMAL HIGH (ref 0.61–1.24)
Creatinine, Ser: 3.94 mg/dL — ABNORMAL HIGH (ref 0.61–1.24)
GFR, Estimated: 11 mL/min — ABNORMAL LOW (ref >60)
GFR, Estimated: 16 mL/min — ABNORMAL LOW (ref >60)
Glucose, Bld: 179 mg/dL — ABNORMAL HIGH (ref 70–99)
Glucose, Bld: 173 mg/dL — ABNORMAL HIGH (ref 70–99)
Phosphorus: 4.4 mg/dL (ref 2.5–4.6)
Phosphorus: 3.1 mg/dL (ref 2.5–4.6)
Potassium: 5.3 mmol/L — ABNORMAL HIGH (ref 3.5–5.1)
Potassium: 3.9 mmol/L (ref 3.5–5.1)
Sodium: 133 mmol/L — ABNORMAL LOW (ref 135–145)
Sodium: 132 mmol/L — ABNORMAL LOW (ref 135–145)

## 2022-04-26 LAB — CBC
HCT: 27.6 % — ABNORMAL LOW (ref 39.0–52.0)
Hemoglobin: 8.3 g/dL — ABNORMAL LOW (ref 13.0–17.0)
MCH: 26.8 pg (ref 26.0–34.0)
MCHC: 30.1 g/dL (ref 30.0–36.0)
MCV: 89 fL (ref 80.0–100.0)
Platelets: 313 10*3/uL (ref 150–400)
RBC: 3.1 MIL/uL — ABNORMAL LOW (ref 4.22–5.81)
RDW: 16 % — ABNORMAL HIGH (ref 11.5–15.5)
WBC: 22.2 10*3/uL — ABNORMAL HIGH (ref 4.0–10.5)
nRBC: 0.5 % — ABNORMAL HIGH (ref 0.0–0.2)

## 2022-04-26 LAB — GLUCOSE, CAPILLARY
Glucose-Capillary: 176 mg/dL — ABNORMAL HIGH (ref 70–99)
Glucose-Capillary: 219 mg/dL — ABNORMAL HIGH (ref 70–99)
Glucose-Capillary: 224 mg/dL — ABNORMAL HIGH (ref 70–99)
Glucose-Capillary: 162 mg/dL — ABNORMAL HIGH (ref 70–99)
Glucose-Capillary: 164 mg/dL — ABNORMAL HIGH (ref 70–99)
Glucose-Capillary: 193 mg/dL — ABNORMAL HIGH (ref 70–99)

## 2022-04-26 LAB — MAGNESIUM: Magnesium: 2.8 mg/dL — ABNORMAL HIGH (ref 1.7–2.4)

## 2022-04-26 LAB — HEPATITIS B SURFACE ANTIBODY,QUALITATIVE: Hep B S Ab: REACTIVE — AB

## 2022-04-26 LAB — HEPATITIS B CORE ANTIBODY, TOTAL: Hep B Core Total Ab: REACTIVE — AB

## 2022-04-26 LAB — HEPATITIS B SURFACE ANTIGEN: Hepatitis B Surface Ag: NONREACTIVE

## 2022-04-26 LAB — HEPATITIS C ANTIBODY: HCV Ab: REACTIVE — AB

## 2022-04-26 MED ORDER — FAMOTIDINE 20 MG PO TABS
20.0000 mg | ORAL_TABLET | Freq: Two times a day (BID) | ORAL | Status: DC
  Administered 2022-04-26: 20 mg via ORAL
  Filled 2022-04-26: qty 1

## 2022-04-26 MED ORDER — HEPARIN SODIUM (PORCINE) 1000 UNIT/ML IJ SOLN
INTRAMUSCULAR | Status: AC
  Administered 2022-04-26: 3800 [IU]
  Filled 2022-04-26: qty 3

## 2022-04-26 MED ORDER — ATORVASTATIN CALCIUM 80 MG PO TABS
80.0000 mg | ORAL_TABLET | Freq: Every day | ORAL | Status: DC
  Administered 2022-04-26 – 2022-05-06 (×11): 80 mg
  Filled 2022-04-26 (×11): qty 1

## 2022-04-26 MED ORDER — PROSOURCE TF20 ENFIT COMPATIBL EN LIQD
60.0000 mL | Freq: Two times a day (BID) | ENTERAL | Status: DC
  Administered 2022-04-26 – 2022-05-02 (×13): 60 mL
  Filled 2022-04-26 (×13): qty 60

## 2022-04-26 MED ORDER — CALCIUM CARBONATE ANTACID 500 MG PO CHEW: 1.0000 | CHEWABLE_TABLET | Freq: Three times a day (TID) | ORAL | Status: DC | PRN

## 2022-04-26 MED ORDER — VITAL 1.5 CAL PO LIQD
1000.0000 mL | ORAL | Status: DC
  Administered 2022-04-28 – 2022-05-02 (×5): 1000 mL
  Filled 2022-04-26 (×3): qty 1000

## 2022-04-26 MED ORDER — FAMOTIDINE 20 MG PO TABS
20.0000 mg | ORAL_TABLET | Freq: Every day | ORAL | Status: DC
  Administered 2022-04-26 – 2022-04-28 (×3): 20 mg via ORAL
  Filled 2022-04-26 (×2): qty 1

## 2022-04-26 MED ORDER — RENA-VITE PO TABS
1.0000 | ORAL_TABLET | Freq: Every day | ORAL | Status: DC
  Administered 2022-04-26 – 2022-04-27 (×2): 1 via ORAL
  Filled 2022-04-26 (×2): qty 1

## 2022-04-26 MED ORDER — INSULIN GLARGINE-YFGN 100 UNIT/ML ~~LOC~~ SOLN
38.0000 [IU] | Freq: Two times a day (BID) | SUBCUTANEOUS | Status: DC
  Administered 2022-04-26 – 2022-04-27 (×2): 38 [IU] via SUBCUTANEOUS
  Filled 2022-04-26 (×3): qty 0.38

## 2022-04-26 MED ORDER — INSULIN GLARGINE-YFGN 100 UNIT/ML ~~LOC~~ SOLN
5.0000 [IU] | Freq: Once | SUBCUTANEOUS | Status: AC
  Administered 2022-04-26: 5 [IU] via SUBCUTANEOUS
  Filled 2022-04-26: qty 0.05

## 2022-04-26 MED ORDER — HEPARIN SODIUM (PORCINE) 5000 UNIT/ML IJ SOLN
INTRAMUSCULAR | Status: AC
  Filled 2022-04-26: qty 1

## 2022-04-26 MED ORDER — FENOFIBRATE 160 MG PO TABS
160.0000 mg | ORAL_TABLET | Freq: Every day | ORAL | Status: DC
  Filled 2022-04-26: qty 1

## 2022-04-26 NOTE — Progress Notes (Signed)
RN called RT to place pt on BiPAP. RT at bedside and removed pt from HFNC and placed pt on BiPAP. Pt tolerated well with SVS. RT will continue to monitor pt.

## 2022-04-26 NOTE — Progress Notes (Signed)
Hd COMPLETED PT STABLE DC WNL PT ALERT NO C.OS UFG 800ML DUE TO HYPOTENSION

## 2022-04-26 NOTE — Progress Notes (Signed)
[facility]  Date of Admission:  04/10/2022     Lines:  8/08-c Right ij hd cath   Abx: 8/17-c amox-clav  8/15-17 vanc 8/15-17 azith 8/15-17 piptazo                                                                 Assessment: 61 y.o. male alcohol abuse, dm2, copd on home o2 and cpap, ckd, admitted 8/6 for dka in shock/pancreatitis, course with leukocytosis for which id consulted     I discussed case with Dr Loanne Drilling. I agree with her no obvious source for leukocytosis, which is in discordant with how the patient is actually clinically appearing.   He does have a left upper lobe cavity which is stable with with new proximal opacity/infiltrate. I think it is reasonable to treat for pna there with oral abx for 3 weeks   In regard to the pancrea stranding on ct, I don't see a reason to presume that is infected at this time (he also has improving gi sx) and we can continue to watch  ------------------ 8/21 assessment Dialysis dependent it appears as trial off crrt showed anuric aki still/uremia/volume issue  Flutuating mentation Stable respiratory status with some o2 dependent and on/off bipap-hfnc (home cpap and o2 supplement use for copd)  Moderate stable leukocytosis Mild gi discomfort    Plan: Finish 3 weeks amox-clav from 8/14 until 9/04 Please reengage [place] if further ID concern Discussed with primary team   I spent more than 35 minute reviewing data/chart, and coordinating care and >50% direct face to face time providing counseling/discussing diagnostics/treatment plan with patient  Principal Problem:   DKA (diabetic ketoacidosis) ([place]) Active Problems:   Essential hypertension   History of alcohol abuse   Acute respiratory failure with hypoxia ([place])   AKI on CKD stage 3b   GERD (gastroesophageal reflux disease)   Chronic obstructive pulmonary disease (HCC)   Acute pancreatitis   SIRS (systemic inflammatory response syndrome)  (HCC)   Hypertriglyceridemia   Hypercholesterolemia   Lactic acidosis   Shock ([place])   Abnormal CT of the abdomen   Cavitary pneumonia   Leukocytosis   Bile duct abnormality   No Known Allergies  Scheduled Meds:  amoxicillin-clavulanate  500 mg Per Tube Q12H   arformoterol  15 mcg Nebulization BID   atorvastatin  80 mg Per Tube Daily   B-complex with vitamin C  1 tablet Per Tube Daily   budesonide (PULMICORT) nebulizer solution  0.5 mg Nebulization BID   Chlorhexidine Gluconate Cloth  6 each Topical Daily   famotidine  20 mg Oral Daily   feeding supplement  237 mL Oral TID BM   feeding supplement (VITAL 1.5 CAL)  1,000 mL Per Tube Q24H   fenofibrate  160 mg Per Tube Daily   heparin  5,000 Units Subcutaneous Q8H   insulin aspart  0-20 Units Subcutaneous Q4H   insulin aspart  10 Units Subcutaneous Q4H   insulin glargine-yfgn  38 Units Subcutaneous BID   midodrine  15 mg Per Tube Q8H   mouth rinse  15 mL Mouth Rinse 4 times per day   simethicone  80 mg Per Tube QID   sodium chloride flush  10-40  mL Intracatheter Q12H   Continuous Infusions:  sodium chloride Stopped (04/21/22 1243)   PRN Meds:.sodium chloride, acetaminophen, albuterol, calcium carbonate, dextrose, gabapentin, ondansetron (ZOFRAN) IV, mouth rinse, oxyCODONE, sodium chloride flush, white petrolatum   SUBJECTIVE: Discussed with nursing staff Intermittent confusion  Remains on dialysis and iHD planned  Afebrile Stable moderate leukocytosis  Off/on hfnc/bipap     Review of Systems: ROS All other ROS was negative, except mentioned above     OBJECTIVE: Vitals:   04/26/22 1352 04/26/22 1400 04/26/22 1415 04/26/22 1430  BP: 102/63 105/64 127/78 112/85  Pulse: (!) 107 (!) 103 98 (!) 105  Resp: (!) 28 (!) '26 19 19  '$ Temp: 98 F (36.7 C)     TempSrc:      SpO2: 93% 92% 96% 95%  Weight: 120.8 kg     Height:       Body mass index is 40.49 kg/m.  Physical Exam  General/constitutional: ill  appearing; no distress; conversant; on hfnc HEENT: Normocephalic, PER, Conj clear Neck supple CV: rrr no mrg Lungs: upper airway sound; normal respiratory effort Abd: Soft, mild tenderness epigastric area Ext: no edema Skin: No Rash Neuro: generalized weakness    Central line presence: right ij hd line site no purulence   Lab Results Lab Results  Component Value Date   WBC 22.2 (H) 04/26/2022   HGB 8.3 (L) 04/26/2022   HCT 27.6 (L) 04/26/2022   MCV 89.0 04/26/2022   PLT 313 04/26/2022    Lab Results  Component Value Date   CREATININE 5.40 (H) 04/26/2022   BUN 44 (H) 04/26/2022   NA 133 (L) 04/26/2022   K 5.3 (H) 04/26/2022   CL 97 (L) 04/26/2022   CO2 20 (L) 04/26/2022    Lab Results  Component Value Date   ALT 20 04/20/2022   AST 35 04/20/2022   ALKPHOS 103 04/20/2022   BILITOT 0.6 04/20/2022      Microbiology: Recent Results (from the past 240 hour(s))  Culture, blood (Routine X 2) w Reflex to ID Panel     Status: None   Collection Time: 04/19/22  9:30 AM   Specimen: BLOOD  Result Value Ref Range Status   Specimen Description BLOOD RIGHT ANTECUBITAL  Final   Special Requests   Final    BOTTLES DRAWN AEROBIC AND ANAEROBIC Blood Culture adequate volume   Culture   Final    NO GROWTH 5 DAYS Performed at [facility], [address], [place], [place] [address]    Report Status 04/24/2022 FINAL  Final  Culture, blood (Routine X 2) w Reflex to ID Panel     Status: None   Collection Time: 04/19/22  9:32 AM   Specimen: BLOOD RIGHT FOREARM  Result Value Ref Range Status   Specimen Description BLOOD RIGHT FOREARM  Final   Special Requests   Final    BOTTLES DRAWN AEROBIC AND ANAEROBIC Blood Culture adequate volume   Culture   Final    NO GROWTH 5 DAYS Performed at [facility], [place] [address], [place],  [address]    Report Status 04/24/2022 FINAL  Final     Serology:   Imaging: If present, new imagings (plain films, ct scans, and  mri) have been personally visualized and interpreted; radiology reports have been reviewed. Decision making incorporated into the Impression / Recommendations.  8/16 [place] ruq 1. Common bile duct measures 7 mm which is within expected limits given age and history of cholecystectomy, decreased from  10 mm on the prior study. No intrahepatic biliary ductal dilatation. 2. Probable fatty infiltration of the liver, unchanged.   8/14 ct chest abd pelv with contrast Chest Impression:   1. Mild RIGHT basilar atelectasis is new from comparison CT. 2. Stable thick-walled cavitary lesion in the LEFT upper lobe. 3. Mild thickening of the esophagus.   Abdomen / Pelvis Impression:   1. Increase in fluid surrounding the pancreas compared to most recent CT. Findings consistent with acute pancreatitis. No organized fluid collections at this point. Pancreas is edematous but no evidence of necrotizing pancreatitis as the pancreas enhances. 2. Feeding tube extends in the fourth portion duodenum. 3. No evidence of bowel obstruction or bowel ischemia.     Jabier Mutton, [facility] [place] [phone] pager    04/26/2022, 2:45 PM

## 2022-04-26 NOTE — Progress Notes (Signed)
RT called by RN requesting to place pt back on BiPAP. RT at bedside, pt refused to be placed back on BiPAP. RT will continue to monitor pt.

## 2022-04-26 NOTE — Progress Notes (Signed)
Inpatient Diabetes Program Recommendations  AACE/ADA: New Consensus Statement on Inpatient Glycemic Control (2015)  Target Ranges:  Prepandial:   less than 140 mg/dL      Peak postprandial:   less than 180 mg/dL (1-2 hours)      Critically ill patients:  140 - 180 mg/dL   Lab Results  Component Value Date   GLUCAP 224 (H) 04/26/2022   HGBA1C 12.5 (H) 04/10/2022    Review of Glycemic Control  Latest Reference Range & Units 04/26/22 03:18 04/26/22 07:48 04/26/22 10:48  Glucose-Capillary 70 - 99 mg/dL 176 (H) 219 (H) 224 (H)  (H): Data is abnormally high Diabetes history: DM2 Outpatient Diabetes medications: Jardiance 10 mg daily Current orders for Inpatient glycemic control:  Novolog 0-20 units q 4 hours Novolog 10 units q 4 hours Semglee 38 units bid Vital 30 ml/hr  Inpatient Diabetes Program Recommendations:    Spoke briefly with patient regarding DM and need for insulin at discharge.  He verbalized understanding but is still weak and will need continued education and follow-up throughout hospitalization.  He states that his daughter works here but was unable to say what department.  Adjustments made in [place] today.  Will follow.   Thanks,  Adah Perl, RN, BC-ADM Inpatient Diabetes Coordinator Pager [phone]  (8a-5p)

## 2022-04-26 NOTE — Progress Notes (Addendum)
[facility] Progress Note Patient Name: Mathew Lara DOB: [birth year] MRN: [id number]   Date of Service  04/26/2022  HPI/Events of Note  Pt complaining of heartburn.   Pt with AKI on CKD, acute pancreatitis, DKA.    eICU Interventions  Calcium carbonate and famotidine ordered.      Intervention Category Minor Interventions: Other:  [name] 04/26/2022, 5:10 AM

## 2022-04-26 NOTE — Progress Notes (Addendum)
04/26/2022 I saw and evaluated the patient. Discussed with resident and agree with resident's findings and plan as documented in the resident's note.  I have seen and evaluated the patient for respiratory failure, pancreatitis, AKI, and DKA.  S:  No events, still appears a bit tenuous from a respiratory standpoint.  Mild heartburn overnight improved.  Off CRRT, not making urine.  O: Blood pressure 96/76, pulse (!) 116, temperature 99.9 F (37.7 C), temperature source Oral, resp. rate (!) 28, height '5\' 8"'$  (1.727 m), weight 120.8 kg, SpO2 92 %.  Ill appearing man mildly tachypneic Abdomen protuberant,+BS, nontender Ext minimal edema, warm to touch Moves all 4 ext to command but weak Heart sounds tachycardic  WBC remains elevated Sugars up BMP worse off CRRT  A:  Acute pancreatitis (hyper-TG, ?alcohol, ?choledocholithiasis) stable HepC + alcoholic liver disease (?cirrhosis is mentioned in chart but imaging/labs now really c/w with this) LUL cavitary PNA vs. Blebitis DKA resolved, residual hyperglycemia; A1c 12.5% AKI on CKD now unfortunately may be HD dependent HLD COPD OSA Shock lingering unclear cause Lingering hypoxemic respiratory failure with background of severe emphysema, differential would be pancreatitis associated third spacing, ARDS, pulmonary edema, splinting.  P:  - Check CXR, echo - Augmentin to complete 2 weeks therapy - Nebs - Wean HFNC to sats > 88% - Limit opiates - Adjust insulin for CBG 140-180 - iHD per nephrology, probably needs again - Keep in ICU for at least today, respiratory status is not great  Patient critically ill due to respiratory failure Interventions to address this today HHFNC adjustment Risk of deterioration without these interventions is high  I personally spent 36 minutes providing critical care not including any separately billable procedures  Erskine Emery MD [facility] [place] epic messenger for cross cover  needs If after hours, please call E-link

## 2022-04-26 NOTE — Progress Notes (Addendum)
NAME:  DEANDREW HOECKER, MRN:  [id number], DOB:  Sep 29, 1960, LOS: 41 ADMISSION DATE:  04/10/2022, CONSULTATION DATE:  8/5 REFERRING MD:  Darrell Jewel, CHIEF COMPLAINT: Abdominal pain  History of Present Illness:  CONSTANCE HACKENBERG is an 61 y.o. M who presented to the [facility] on 8/5 with a chief complaint of abdominal pain.  They have a past medical history of alcohol abuse, anxiety, CKD 3, COPD, OSA, type 2 diabetes, hypertension, hyperlipidemia, hep C+  ED work-up revealed DKA, acute pancreatitis, AKI.  He was admitted to the hospital service.  Overnight 8/5 he developed continuing respiratory distress, shock.   PCCM was consulted for transfer to [facility] for further management.   Pertinent  Medical History  Alcohol abuse, anxiety, CKD 3, COPD, OSA, type 2 diabetes, hypertension, hyperlipidemia, hep C  Significant Hospital Events: Including procedures, antibiotic start and stop dates in addition to other pertinent events   8/5 Presented to [facility] ED, transferred [facility] 8/7 On pressors  8/8 BiPAP, worsening renal parameters 8/9 Tolerating CRRT, on BiPAP 8/10 has been off BiPAP, on high flow oxygen and tolerating 8/11 UOP improving. CRRT with net even UF 8/14 CT reveals worsening cavitary pneumonia and acute pancreatitis, large BM 8/15 abx changed for cavitary pneumonia 8/16 ID consult.  ID stopped vancomycin, Zosyn, azithromycin >narrowed to augmentin. CRRT stopped.   8/17 CRRT restarted.  Off pressors 8/19 Remains off pressors, CRRT ongoing. HD cath required tPA.   Interim History / Subjective:  Afebrile Issues with soft BP overnight I/O 2.425 L in, CRRT out 515 -7.165 L net admission HFNC 40% FiO2/30L flow  Objective   Blood pressure (!) 89/67, pulse (!) 111, temperature 100.1 F (37.8 C), temperature source Oral, resp. rate (!) 29, height '5\' 8"'$  (1.727 m), weight 120.8 kg, SpO2 91 %.    Vent Mode: PCV;BIPAP FiO2 (%):  [40 %-50 %] 40 % Set Rate:  [18 bmp] 18  bmp PEEP:  [5 cmH20] 5 cmH20   Intake/Output Summary (Last 24 hours) at 04/26/2022 0539 Last data filed at 04/26/2022 0600 Gross per 24 hour  Intake 2425 ml  Output 515 ml  Net 1910 ml    Filed Weights   04/24/22 0500 04/25/22 0500 04/26/22 0500  Weight: 121.9 kg 119.7 kg 120.8 kg   Physical Exam: General: adult male lying in bed in NAD HEENT: MM pink/moist, poor dentition, R IJ HD cath, anicteric  Neuro: AAOx2, speech clear CV: s1s2 RRR, no m/r/g PULM: non-labored at rest, CTABL GI: soft, normal BS, cortrak in place   Extremities: warm/dry, no edema, cap refill < 2 sec Skin: no rashes or lesions  Resolved Hospital Problem list   Multifactorial shock  Assessment & Plan:   LUL Cavitary Pneumonia Paraseptal Emphysema Underlying OSA Hx post-infectious scarring - no ILD on CT BC negative. Cavitary PNA on CT chest never treated. Has been on/off bipap. ID has low concern for fungal etiology. On [place] 30L Fio2 40%, afebrile overnight.  -wean HHFNC for sats 88-94% -pulmonary hygiene -IS, mobilize -brovana, pulmicort + PRN albuterol  -appreciate ID  -augmentin (8/14-9/4) -QHS & PRN daytime sleep bipap  Acute Metabolic Encephalopathy  Improved with BiPAP, suspect hypercarbic failure +/- hypoactive delirium and sepsis. CT head neg. Alert and oriented x 2 today (person/place), clear speech.  -PT efforts  -supportive care  -frequent reorientation, promote sleep / wake cycle   Multifactorial Shock  Shock resolved, on midodrine -midodrine '15mg'$  TID -cortisol 19.4 on 8/19  -abx as above  AKI on CKD 2/2 ATN in setting of acute pancreatitis and shock CRRT started 8/17. Cr worsened to 5.40 from 3.86. K 5.3 (4.7) this morning. -515 cc w/ CRT. BP soft overnight ranging down to 80's/60's. 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 RUQ [place] 04/10/22 - CBD dilatation 10.8 mm. Ultrasound abdomen 8/16: Common bile duct measures 7 mm decreased from 10 mm, fatty liver.  GI feels that ultrasound shows normalizing of the CBD, thus may have pass the gallstone. Per GI, not a candidate for MRCP. -diet Clear liquid w/ trickle feeds at 30 cc/hr -PRN tylenol    DM II  DKA - resolved Glucose range 176-262. Patient received 102 units of short acting yesterday. -SSI, resistant scale -increase TF coverage to 10 units Q4  -Semglee 38 units BID    History of diastolic heart failure Echo 12/25/2019 revealed ejection fraction of 60 to 65% -tele monitoring   Constipation - improving Bowel movement yesterday.  Status post methylnaltrexone on 8/14 and smog enema -bowel regimen  -trickle TF & clear liquid diet  -minimize narcotics as able   Hiccups -gabapentin 100 mg TID PRN  Best Practice (right click and "Reselect all SmartList Selections" daily)  Diet/type: tubefeeds and clear liquids DVT prophylaxis: prophylactic heparin  GI prophylaxis: N/A Lines: Dialysis Catheter and yes and it is still needed Foley:  N/A Code Status:  full code Last date of multidisciplinary goals of care discussion: Patient updated 8/21. Family updated at bedside 8/19 on plan of care.  Critical care time: 30 minutes   Holley Bouche, MD PGY-2 [facility] 04/26/2022, 7:12 AM   Please see [email or web address] for pager details.   From 7A-7P if no response, please call [phone] After hours, please call ELink [phone]

## 2022-04-26 NOTE — Progress Notes (Signed)
Bedside tx---labs pending

## 2022-04-26 NOTE — Progress Notes (Signed)
TX INITIATED VIA RDC WNL W/O DIFFICULTY BFR ACHIEVED UFG 1L.

## 2022-04-26 NOTE — Progress Notes (Signed)
[facility] Progress Note   Assessment/ Plan:   Dialysis dependent AKI/CKD stage IIIb-IV - presumably ischemic ATN in setting of acute pancreatitis, hypotension, pressor support, with concomitant ARB, SGLT-2 inhibitor and finerenone.  Initially non-oliguric but now UOP has decreased since being placed on pressors.  Started on CRRT 04/13/22.  RIJ temp HD cath placed 04/13/22.  Continue to hold losartan, Jardiance, and [place].   Off CRRT 04/25/22 Trial iHD today bedside, use midodrine and bolus albumin: 2K, 1-2L UF Keep MAP >65 as above Renal dose meds for eGFR <15 mL/min.   Avoid nephrotoxic agents such as IV contrast, NSAIDs, and phosphate containing bowel preps (FLEETS).   Preferred narcotics are hydromorphone, fentanyl, and methadone.  Morphine should be avoided. Acute pancreatitis  - GI following, appreciate assistance  - likely d/t triglycerides  DKA- resolved  Shock - likely due to pancreatitis + cavitary pneumonia -   Repeat CT abd pelvis 8/14 with edematous pancreas but no evidence of necrotizing pancreatitis.    - s/p vanc/ cefepime/ flagyl/ azithro--> augmentin - on midodrine 15 TID Acute hypoxic respiratory failure - with underlying COPD and OSA.  Per PCCM Hep C with advanced liver fibrosis Chronic diastolic CHF - stable.  Subjective:    Stopped CRRT yesterday SCR 3.9 to 5.4, K 5.3 and BUN 44 Remains anuric On HFNC BPs soft, no pressors   Objective:   BP 96/76   Pulse (!) 116   Temp 99.9 F (37.7 C) (Oral)   Resp (!) 28   Ht '5\' 8"'  (1.727 m)   Wt 120.8 kg   SpO2 92%   BMI 40.49 kg/m   Physical Exam: DVV:OHYWV, alert CVS: tachycardic Resp: CTAB on HFNC Abd: soft, obese, somewhat distended, mildly tender Ext: no LE edema ACCESS: R IJ nontunneled HD cath  Labs: BMET Recent Labs  Lab 04/22/22 1542 04/23/22 0345 04/23/22 1953 04/24/22 0547 04/24/22 1551 04/25/22 0255 04/26/22 0356  NA 137 138 137 135 136 134* 133*  K 3.8 3.7 4.2 4.3 4.6  4.7 5.3*  CL 103 102 101 100 99 98 97*  CO2 20* 26 23 21* 25 24 20*  GLUCOSE 234* 204* 183* 190* 228* 212* 179*  BUN 61* 47* 37* 31* 29* 33* 44*  CREATININE 5.25* 4.10* 3.51* 3.33* 3.06* 3.86* 5.40*  CALCIUM 9.5 9.4 9.6 9.6 9.6 9.6 9.7  PHOS 5.1* 3.6 3.8 3.4 2.9 3.8 4.4    CBC Recent Labs  Lab 04/21/22 0559 04/22/22 0323 04/23/22 0345 04/24/22 0547 04/25/22 0255 04/26/22 0356  WBC 25.7*  25.6*   < > 24.3* 22.0* 21.6* 22.2*  NEUTROABS 21.0*  --   --   --   --   --   HGB 10.4*  10.2*   < > 9.1* 9.6* 9.0* 8.3*  HCT 33.7*  33.6*   < > 30.3* 32.2* 29.6* 27.6*  MCV 85.5  86.6   < > 88.6 88.7 88.4 89.0  PLT 342  347   < > 374 355 322 313   < > = values in this interval not displayed.       Medications:     amoxicillin-clavulanate  500 mg Per Tube Q12H   arformoterol  15 mcg Nebulization BID   B-complex with vitamin C  1 tablet Per Tube Daily   budesonide (PULMICORT) nebulizer solution  0.5 mg Nebulization BID   Chlorhexidine Gluconate Cloth  6 each Topical Daily   famotidine  20 mg Oral Daily   feeding supplement  237 mL  Oral TID BM   feeding supplement (VITAL 1.5 CAL)  1,000 mL Per Tube Q24H   heparin  5,000 Units Subcutaneous Q8H   insulin aspart  0-20 Units Subcutaneous Q4H   insulin aspart  10 Units Subcutaneous Q4H   insulin glargine-yfgn  38 Units Subcutaneous BID   insulin glargine-yfgn  5 Units Subcutaneous Once   midodrine  15 mg Per Tube Q8H   mouth rinse  15 mL Mouth Rinse 4 times per day   simethicone  80 mg Per Tube QID   sodium chloride flush  10-40 mL Intracatheter Q12H   Rexene Agent, MD 04/26/2022, 10:04 AM

## 2022-04-26 NOTE — Progress Notes (Signed)
Nutrition Follow-up  DOCUMENTATION CODES:   Not applicable  INTERVENTION:   Continue tube feeding via Cortrak:  Vital 1.5 increase to goal rate of 60 ml/h (1440 ml per day) Prosource TF20 60 ml BID   TF regimen provides 2320 kcal, 137 gm protein, 1100 ml free water daily.  Renal MVI PO daily.  NUTRITION DIAGNOSIS:   Increased nutrient needs related to acute illness (renal failure; new to HD) as evidenced by estimated needs.  Ongoing  GOAL:   Patient will meet greater than or equal to 90% of their needs  Progressing  MONITOR:   PO intake, Supplement acceptance, Labs, I & O's  REASON FOR ASSESSMENT:   Rounds (Poor PO on CRRT)    ASSESSMENT:   61 yo male admitted with DKA, acute pancreatitis, respiratory distress, shock. PMH includes alcohol abuse, anxiety, CKD 3, COPD, DM-2, HTN, HLD, hepatitis C.  Discussed patient in ICU rounds and with RN today.  Patient is tolerating Vital 1.5 at 30 ml/h via Cortrak (tip is at the LOT). Okay to advance to goal rate of 60 ml/h today per discussion with CCM.  He remains on clear liquids, but intake is minimal. He is being offered Ensure supplements, but refusing them.  Patient is receiving iHD today.   Labs reviewed. Na 133, K 5.3 CBG: 219-224  Medications reviewed and include Novolog, Semglee.  Net IO Since Admission: 73.44 mL [04/26/22 1602]  Intake/Output Summary (Last 24 hours) at 04/26/2022 1602 Last data filed at 04/26/2022 1200 Gross per 24 hour  Intake 1255 ml  Output 0 ml  Net 1255 ml    Admission weight 134.5 kg. Weight 120.8 kg today.   Diet Order:   Diet Order             Diet clear liquid Room service appropriate? Yes; Fluid consistency: Thin  Diet effective now                   EDUCATION NEEDS:   Not appropriate for education at this time  Skin:  Skin Assessment: Reviewed RN Assessment  Last BM:  8/19 type 6  Height:   Ht Readings from Last 1 Encounters:  04/10/22 '5\' 8"'$  (1.727 m)     Weight:   Wt Readings from Last 1 Encounters:  04/26/22 120.8 kg    Ideal Body Weight:  70 kg  BMI:  Body mass index is 40.49 kg/m.  Estimated Nutritional Needs:   Kcal:  2300-2500  Protein:  105-140 gm  Fluid:  1 L + UOP    Lucas Mallow RD, LDN, CNSC Please refer to Amion for contact information.

## 2022-04-27 ENCOUNTER — Inpatient Hospital Stay (HOSPITAL_COMMUNITY): Payer: Medicare Other

## 2022-04-27 DIAGNOSIS — I420 Dilated cardiomyopathy: Secondary | ICD-10-CM

## 2022-04-27 DIAGNOSIS — E1110 Type 2 diabetes mellitus with ketoacidosis without coma: Secondary | ICD-10-CM | POA: Diagnosis not present

## 2022-04-27 LAB — RENAL FUNCTION PANEL
Albumin: 2.3 g/dL — ABNORMAL LOW (ref 3.5–5.0)
Albumin: 2.3 g/dL — ABNORMAL LOW (ref 3.5–5.0)
Anion gap: 11 (ref 5–15)
Anion gap: 14 (ref 5–15)
BUN: 36 mg/dL — ABNORMAL HIGH (ref 8–23)
BUN: 46 mg/dL — ABNORMAL HIGH (ref 8–23)
CO2: 26 mmol/L (ref 22–32)
CO2: 25 mmol/L (ref 22–32)
Calcium: 8.9 mg/dL (ref 8.9–10.3)
Calcium: 9.4 mg/dL (ref 8.9–10.3)
Chloride: 94 mmol/L — ABNORMAL LOW (ref 98–111)
Chloride: 95 mmol/L — ABNORMAL LOW (ref 98–111)
Creatinine, Ser: 5.69 mg/dL — ABNORMAL HIGH (ref 0.61–1.24)
Creatinine, Ser: 6.89 mg/dL — ABNORMAL HIGH (ref 0.61–1.24)
GFR, Estimated: 11 mL/min — ABNORMAL LOW (ref >60)
GFR, Estimated: 8 mL/min — ABNORMAL LOW (ref >60)
Glucose, Bld: 197 mg/dL — ABNORMAL HIGH (ref 70–99)
Glucose, Bld: 257 mg/dL — ABNORMAL HIGH (ref 70–99)
Phosphorus: 5.5 mg/dL — ABNORMAL HIGH (ref 2.5–4.6)
Phosphorus: 6.2 mg/dL — ABNORMAL HIGH (ref 2.5–4.6)
Potassium: 4.4 mmol/L (ref 3.5–5.1)
Potassium: 4.5 mmol/L (ref 3.5–5.1)
Sodium: 131 mmol/L — ABNORMAL LOW (ref 135–145)
Sodium: 134 mmol/L — ABNORMAL LOW (ref 135–145)

## 2022-04-27 LAB — GLUCOSE, CAPILLARY
Glucose-Capillary: 194 mg/dL — ABNORMAL HIGH (ref 70–99)
Glucose-Capillary: 195 mg/dL — ABNORMAL HIGH (ref 70–99)
Glucose-Capillary: 237 mg/dL — ABNORMAL HIGH (ref 70–99)
Glucose-Capillary: 265 mg/dL — ABNORMAL HIGH (ref 70–99)
Glucose-Capillary: 291 mg/dL — ABNORMAL HIGH (ref 70–99)
Glucose-Capillary: 296 mg/dL — ABNORMAL HIGH (ref 70–99)

## 2022-04-27 LAB — ECHOCARDIOGRAM COMPLETE
Area-P 1/2: 4.6 cm2
Height: 68 in
S' Lateral: 3.1 cm
Weight: 4225.78 oz

## 2022-04-27 LAB — HEPATITIS B SURFACE ANTIBODY, QUANTITATIVE: Hep B S AB Quant (Post): 17.5 m[IU]/mL (ref >9.9)

## 2022-04-27 LAB — MAGNESIUM: Magnesium: 2.3 mg/dL (ref 1.7–2.4)

## 2022-04-27 LAB — TRIGLYCERIDES: Triglycerides: 407 mg/dL — ABNORMAL HIGH (ref <150)

## 2022-04-27 MED ORDER — OXYCODONE HCL 5 MG PO TABS
2.5000 mg | ORAL_TABLET | Freq: Three times a day (TID) | ORAL | Status: DC | PRN
  Administered 2022-04-27 – 2022-05-06 (×10): 2.5 mg
  Filled 2022-04-27 (×11): qty 1

## 2022-04-27 MED ORDER — INSULIN ASPART 100 UNIT/ML IJ SOLN
12.0000 [IU] | INTRAMUSCULAR | Status: DC
  Administered 2022-04-27 – 2022-04-28 (×6): 12 [IU] via SUBCUTANEOUS

## 2022-04-27 MED ORDER — INSULIN GLARGINE-YFGN 100 UNIT/ML ~~LOC~~ SOLN
40.0000 [IU] | Freq: Two times a day (BID) | SUBCUTANEOUS | Status: DC
  Administered 2022-04-27: 40 [IU] via SUBCUTANEOUS
  Filled 2022-04-27 (×3): qty 0.4

## 2022-04-27 MED ORDER — POLYETHYLENE GLYCOL 3350 17 G PO PACK
17.0000 g | PACK | Freq: Every day | ORAL | Status: DC
  Administered 2022-04-27: 17 g via ORAL
  Filled 2022-04-27: qty 1

## 2022-04-27 MED ORDER — DOCUSATE SODIUM 50 MG/5ML PO LIQD
50.0000 mg | Freq: Every day | ORAL | Status: DC
  Administered 2022-04-27: 50 mg
  Filled 2022-04-27: qty 10

## 2022-04-27 NOTE — Progress Notes (Signed)
[facility] Progress Note   Assessment/ Plan:   Anuric dialysis dependent AKI/CKD stage IIIb-IV - presumably ischemic ATN in setting of acute pancreatitis, hypotension, pressor support, with concomitant ARB, SGLT-2 inhibitor and finerenone.  Initially non-oliguric but now UOP has decreased since being placed on pressors.  Started on CRRT 04/13/22 - 04/25/22.  RIJ temp HD cath placed 04/13/22.  Continue to hold losartan, Jardiance, and [place].   Tol iHD 04/26/22 Cont HD on MWF schedule at this time, tentatively Use midodrine and bolus albumin: 2K, 1-2L UF, albumin/midodrin Keep MAP >65 as above Will need to get [facility] in at some point, seems will be prolonged/indefinite HD dependence Acute pancreatitis  - GI following, appreciate assistance  - likely d/t triglycerides  DKA- resolved  Shock - likely due to pancreatitis + cavitary pneumonia -   Repeat CT abd pelvis 8/14 with edematous pancreas but no evidence of necrotizing pancreatitis.    - s/p vanc/ cefepime/ flagyl/ azithro--> augmentin - on midodrine 15 TID Acute hypoxic respiratory failure - with underlying COPD and OSA.  Per PCCM Hep C with advanced liver fibrosis Chronic diastolic CHF - stable.  Subjective:    Rec HD yesterday, < 1L UF K 4.4, BUN 36 this AM Remains anuric On HFNC BPs soft, no pressors   Objective:   BP (!) 68/58   Pulse (!) 112   Temp 98.8 F (37.1 C) (Axillary)   Resp (!) 22   Ht '5\' 8"'$  (1.727 m)   Wt 119.8 kg   SpO2 94%   BMI 40.16 kg/m   Physical Exam: GMW:NUUVO, not very alert or engaged CVS: tachycardic Resp: CTAB on HFNC Abd: soft, obese, somewhat distended, mildly tender Ext: no LE edema ACCESS: R IJ nontunneled HD cath  Labs: BMET Recent Labs  Lab 04/23/22 1953 04/24/22 0547 04/24/22 1551 04/25/22 0255 04/26/22 0356 04/26/22 1816 04/27/22 0516  NA 137 135 136 134* 133* 132* 131*  K 4.2 4.3 4.6 4.7 5.3* 3.9 4.4  CL 101 100 99 98 97* 93* 94*  CO2 23 21* 25 24 20* 26  26  GLUCOSE 183* 190* 228* 212* 179* 173* 197*  BUN 37* 31* 29* 33* 44* 26* 36*  CREATININE 3.51* 3.33* 3.06* 3.86* 5.40* 3.94* 5.69*  CALCIUM 9.6 9.6 9.6 9.6 9.7 8.9 8.9  PHOS 3.8 3.4 2.9 3.8 4.4 3.1 5.5*    CBC Recent Labs  Lab 04/21/22 0559 04/22/22 0323 04/23/22 0345 04/24/22 0547 04/25/22 0255 04/26/22 0356  WBC 25.7*  25.6*   < > 24.3* 22.0* 21.6* 22.2*  NEUTROABS 21.0*  --   --   --   --   --   HGB 10.4*  10.2*   < > 9.1* 9.6* 9.0* 8.3*  HCT 33.7*  33.6*   < > 30.3* 32.2* 29.6* 27.6*  MCV 85.5  86.6   < > 88.6 88.7 88.4 89.0  PLT 342  347   < > 374 355 322 313   < > = values in this interval not displayed.       Medications:     amoxicillin-clavulanate  500 mg Per Tube Q12H   arformoterol  15 mcg Nebulization BID   atorvastatin  80 mg Per Tube Daily   budesonide (PULMICORT) nebulizer solution  0.5 mg Nebulization BID   Chlorhexidine Gluconate Cloth  6 each Topical Daily   famotidine  20 mg Oral Daily   feeding supplement  237 mL Oral TID BM   feeding supplement (PROSource TF20)  60  mL Per Tube BID   heparin  5,000 Units Subcutaneous Q8H   insulin aspart  0-20 Units Subcutaneous Q4H   insulin aspart  10 Units Subcutaneous Q4H   insulin glargine-yfgn  38 Units Subcutaneous BID   midodrine  15 mg Per Tube Q8H   multivitamin  1 tablet Oral QHS   mouth rinse  15 mL Mouth Rinse 4 times per day   simethicone  80 mg Per Tube QID   sodium chloride flush  10-40 mL Intracatheter Q12H   Rexene Agent, MD 04/27/2022, 9:59 AM

## 2022-04-27 NOTE — Evaluation (Signed)
Occupational Therapy Evaluation Patient Details Name: Mathew Lara MRN: [id number] DOB: 01-19-1961 Today's Date: 04/27/2022   History of Present Illness 61 yo male presenting to [facility] ED on 8/5 with abdominal pain. ED work-up revealed DKA, acute pancreatitis, AKI. CT 8/5 compatible with acute pancreatitis. Overnight 8/5 developed continuing respiratory failure and sock. Transfer to [facility]. Placed on BiPAP. Started on CRRT 04/13/22 - 04/25/22. Repeat CT abd pelvis 8/14 with edematous pancreas but no evidence of necrotizing pancreatitis. PMH including cholecystectomy (March 23, 2021), alcohol abuse, anxiety, CKD, COPD, diabetes mellitus type 2, essential hypertension, hepatitis C, mixed hyperlipidemia.   Clinical Impression   PTA, pt was living with his sister and was independent with ADLs and simple IADLs; occasionally uses a cane. Pt currently requiring Max A for ADLs and Mod-Max A +2 for bed mobility. Pt presenting with decreased balance, strength, and activity tolerance with soft BP.  Pt would benefit from further acute OT to facilitate safe dc. Recommend dc to SNF for further OT to optimize safety, independence with ADLs, and return to PLOF.      Recommendations for follow up therapy are one component of a multi-disciplinary discharge planning process, led by the attending physician.  Recommendations may be updated based on patient status, additional functional criteria and insurance authorization.   Follow Up Recommendations  Skilled nursing-short term rehab (<3 hours/day)    Assistance Recommended at Discharge Frequent or constant Supervision/Assistance  Patient can return home with the following Two people to help with walking and/or transfers;Two people to help with bathing/dressing/bathroom    Functional Status Assessment  Patient has had a recent decline in their functional status and demonstrates the ability to make significant improvements in function in a reasonable and  predictable amount of time.  Equipment Recommendations  Other (comment) (Defer to next venue)    Recommendations for Other Services PT consult     Precautions / Restrictions Precautions Precautions: Fall;Other (comment) Precaution Comments: NG tube, BiPAP      Mobility Bed Mobility Overal bed mobility: Needs Assistance Bed Mobility: Supine to Sit, Sit to Supine     Supine to sit: +2 for physical assistance, +2 for safety/equipment, HOB elevated, Mod assist Sit to supine: Max assist, +2 for physical assistance, +2 for safety/equipment, HOB elevated   General bed mobility comments: Mod A for elevatring trunk and scooting hips forward. Able to managing BLEs towards EOB. Max A +2 for return to supine    Transfers                   General transfer comment: Defer due to BP      Balance                                           ADL either performed or assessed with clinical judgement   ADL Overall ADL's : Needs assistance/impaired Eating/Feeding: Moderate assistance;Bed level   Grooming: Moderate assistance;Sitting   Upper Body Bathing: Maximal assistance;Sitting   Lower Body Bathing: Maximal assistance;Sit to/from stand   Upper Body Dressing : Maximal assistance;Sitting   Lower Body Dressing: Maximal assistance;Sit to/from stand                 General ADL Comments: Limited by BP and dizziness. Difficulty following commands. Sitting EOB and tolerating 3 minutes of sitting.     Vision Baseline Vision/History: 1 Wears glasses  Perception     Praxis      Pertinent Vitals/Pain Pain Assessment Pain Assessment: Faces Faces Pain Scale: Hurts little more Pain Location: Back Pain Descriptors / Indicators: Discomfort, Grimacing Pain Intervention(s): Monitored during session, Limited activity within patient's tolerance, Repositioned     Hand Dominance Right   Extremity/Trunk Assessment Upper Extremity Assessment Upper  Extremity Assessment: Generalized weakness;Difficult to assess due to impaired cognition   Lower Extremity Assessment Lower Extremity Assessment: Difficult to assess due to impaired cognition   Cervical / Trunk Assessment Cervical / Trunk Assessment: Other exceptions Cervical / Trunk Exceptions: Forward rounding of shoulder   Communication Communication Communication: Other (comment) (soft spoken, on BiPAP)   Cognition Arousal/Alertness: Lethargic Behavior During Therapy: Flat affect Overall Cognitive Status: Difficult to assess                                 General Comments: Oriented to self, place, and year. Lethargic and fatigued. Falling asleep during conversation. Difficulty following commands. Keeping eyes closed for majority of session     General Comments  BP supine 97/76 (83), sitting EOB 83/65 (72), supine 89/53 (64), and supine after 4 mins 87/63 (70)    Exercises     Shoulder Instructions      Home Living Family/patient expects to be discharged to:: Private residence Living Arrangements: Other relatives (His sister) Available Help at Discharge: Family Type of Home: House Home Access: Level entry     Home Layout: One level     Bathroom Shower/Tub: Teacher, early years/pre: Handicapped height     Home Equipment: Radio producer - single point   Additional Comments: Daughter arriving after session and providing information      Prior Functioning/Environment Prior Level of Function : Independent/Modified Independent             Mobility Comments: Occasionally using cane ADLs Comments: Performing ADLs and simple IADLs. His sister and daughter assist with driving. Daughter manages medications.        OT Problem List: Decreased strength;Decreased range of motion;Decreased activity tolerance;Impaired balance (sitting and/or standing);Decreased cognition;Decreased safety awareness;Decreased knowledge of use of DME or AE;Decreased  knowledge of precautions;Pain      OT Treatment/Interventions: Self-care/ADL training;Therapeutic exercise;Energy conservation;DME and/or AE instruction;Therapeutic activities;Patient/family education;Balance training    OT Goals(Current goals can be found in the care plan section) Acute Rehab OT Goals Patient Stated Goal: Unstated OT Goal Formulation: With patient Time For Goal Achievement: 05/11/22 Potential to Achieve Goals: Good  OT Frequency: Min 2X/week    Co-evaluation PT/OT/SLP Co-Evaluation/Treatment: Yes Reason for Co-Treatment: For patient/therapist safety;To address functional/ADL transfers   OT goals addressed during session: ADL's and self-care      AM-PAC OT "6 Clicks" Daily Activity     Outcome Measure Help from another person eating meals?: A Little Help from another person taking care of personal grooming?: A Little Help from another person toileting, which includes using toliet, bedpan, or urinal?: A Lot Help from another person bathing (including washing, rinsing, drying)?: A Lot Help from another person to put on and taking off regular upper body clothing?: A Lot Help from another person to put on and taking off regular lower body clothing?: A Lot 6 Click Score: 14   End of Session Equipment Utilized During Treatment: Oxygen Nurse Communication: Mobility status  Activity Tolerance: Patient limited by fatigue (BP) Patient left: in bed;with call bell/phone within reach;with bed alarm set  OT Visit Diagnosis: Unsteadiness on feet (R26.81);Other abnormalities of gait and mobility (R26.89);Muscle weakness (generalized) (M62.81);Pain                Time: 1898-4210 OT Time Calculation (min): 23 min Charges:  OT General Charges $OT Visit: 1 Visit OT Evaluation $OT Eval Moderate Complexity: 1 Mod  Olisa Quesnel MSOT, OTR/L Acute Rehab Office: [place] 04/27/2022, 2:46 PM

## 2022-04-27 NOTE — Progress Notes (Signed)
NAME:  Mathew Lara, MRN:  [id number], DOB:  Jun 01, 1961, LOS: 78 ADMISSION DATE:  04/10/2022, CONSULTATION DATE:  8/5 REFERRING MD:  Darrell Jewel, CHIEF COMPLAINT: Abdominal pain  History of Present Illness:  Mathew Lara is an 61 y.o. M who presented to the [facility] on 8/5 with a chief complaint of abdominal pain.  They have a past medical history of alcohol abuse, anxiety, CKD 3, COPD, OSA, type 2 diabetes, hypertension, hyperlipidemia, hep C+  ED work-up revealed DKA, acute pancreatitis, AKI.  He was admitted to the hospital service.  Overnight 8/5 he developed continuing respiratory distress, shock.   PCCM was consulted for transfer to [facility] for further management.   Pertinent  Medical History  Alcohol abuse, anxiety, CKD 3, COPD, OSA, type 2 diabetes, hypertension, hyperlipidemia, hep C  Significant Hospital Events: Including procedures, antibiotic start and stop dates in addition to other pertinent events   8/5 Presented to [facility] ED, transferred [facility] 8/7 On pressors  8/8 BiPAP, worsening renal parameters 8/9 Tolerating CRRT, on BiPAP 8/10 has been off BiPAP, on high flow oxygen and tolerating 8/11 UOP improving. CRRT with net even UF 8/14 CT reveals worsening cavitary pneumonia and acute pancreatitis, large BM 8/15 abx changed for cavitary pneumonia 8/16 ID consult.  ID stopped vancomycin, Zosyn, azithromycin >narrowed to augmentin. CRRT stopped.   8/17 CRRT restarted.  Off pressors 8/19 Remains off pressors, CRRT ongoing. HD cath required tPA.  8/21 iHD, of CRRT  Interim History / Subjective:  Afebrile Output 800 iHD,  -8.71 L net admission BiPAP overnight  Objective   Blood pressure 100/72, pulse (!) 192, temperature 98 F (36.7 C), temperature source Axillary, resp. rate (!) 21, height '5\' 8"'$  (1.727 m), weight 119.8 kg, SpO2 100 %.    Vent Mode: PCV;BIPAP FiO2 (%):  [30 %-50 %] 50 % Set Rate:  [16 bmp] 16 bmp PEEP:  [5 cmH20] 5 cmH20    Intake/Output Summary (Last 24 hours) at 04/27/2022 0724 Last data filed at 04/27/2022 0600 Gross per 24 hour  Intake 930 ml  Output 800 ml  Net 130 ml    Filed Weights   04/26/22 0500 04/26/22 1352 04/27/22 0500  Weight: 120.8 kg 120.8 kg 119.8 kg   Physical Exam: General: adult male lying in bed in NAD HEENT: MM pink/moist, R IJ HD cath, anicteric  Neuro: AAOx3, speech clear CV: s1s2 RRR, no m/r/g PULM: non-labored at rest, CTABL GI: soft, normal BS, cortrak in place   Extremities: warm/dry, no edema, cap refill < 2 sec Skin: no rashes or lesions  Resolved Hospital Problem list   Multifactorial shock  Assessment & Plan:   LUL Cavitary Pneumonia Paraseptal Emphysema Underlying OSA Hx post-infectious scarring - no ILD on CT BC negative. Cavitary PNA on CT chest never treated. Has been on/off bipap. ID has low concern for fungal etiology. Afebrile and BiPAP overnight. CXR showed cardiomegaly with pulmonary vascular congestion with linear densities in left mid and both lower lung fields. Patient to receive echo today.  -f/u echo -wean HHFNC for sats 88-94% -pulmonary hygiene -IS, mobilize -brovana, pulmicort + PRN albuterol  -appreciate ID  -augmentin (8/14-9/4) -QHS & PRN daytime sleep bipap  Acute Metabolic Encephalopathy  Improved with BiPAP, suspect hypercarbic failure +/- hypoactive delirium and sepsis. CT head neg. Alert and oriented x 3 today (person/place/day), clear speech.  -PT efforts  -supportive care  -frequent reorientation, promote sleep / wake cycle   Multifactorial Shock  Shock resolved,  on midodrine -midodrine '15mg'$  TID -abx as above   AKI on CKD 2/2 ATN in setting of acute pancreatitis and shock CRRT started 8/17. Cr worsened to 5.69 from 3.94. K 4.4 (3.9) this morning. -800 cc w/ iHD.  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 RUQ [place] 04/10/22 - CBD dilatation 10.8 mm. Ultrasound abdomen 8/16: Common bile duct measures 7 mm decreased from 10 mm, fatty liver.  GI feels that ultrasound shows normalizing of the CBD, thus may have pass the gallstone. Per GI, not a candidate for MRCP. Triglycerides 407 increased from 358, and continues to rise/fall and remain around 400's. Patient poor candidate for fibrates b/c of renal function, started statin.  -diet Clear liquid w/ trickle feeds at 60 cc/hr  -PRN tylenol  -atorvastatin   DM II  DKA - resolved Glucose range 164-224. Patient received 90 units of short acting yesterday. -SSI, resistant scale -increase TF coverage to 12 units Q4  -Semglee 40 units BID    History of diastolic heart failure Echo 12/25/2019 revealed ejection fraction of 60 to 65%. Repeat echo today. -f/u echo -tele monitoring   Constipation Bowel movement 2 days ago.  Status post methylnaltrexone on 8/14 and smog enema -bowel regimen  -trickle TF & clear liquid diet  -minimize narcotics as able   Hiccups -gabapentin 100 mg TID PRN  Best Practice (right click and "Reselect all SmartList Selections" daily)  Diet/type: tubefeeds and clear liquids DVT prophylaxis: prophylactic heparin  GI prophylaxis: N/A Lines: Dialysis Catheter and yes and it is still needed Foley:  N/A Code Status:  full code Last date of multidisciplinary goals of care discussion: Patient updated 8/22. Family updated at bedside 8/19 on plan of care.  Critical care time: 30 minutes   Holley Bouche, MD PGY-2 [facility] 04/27/2022, 7:24 AM   Please see [email or web address] for pager details.   From 7A-7P if no response, please call [phone] After hours, please call ELink [phone]

## 2022-04-27 NOTE — Progress Notes (Signed)
  Echocardiogram 2D Echocardiogram has been performed.  Mathew Lara 04/27/2022, 11:30 AM

## 2022-04-27 NOTE — Progress Notes (Signed)
[facility] Progress Note Patient Name: GARION WEMPE DOB: [birth year] MRN: [id number]   Date of Service  04/27/2022  HPI/Events of Note  Patient has had 4 large watery stools, he is on Colace. No contraindications to [facility] insertion.  eICU Interventions  Flexiseal ordered, Colace discontinued.        Kerry Kass Eljay Lave 04/27/2022, 10:45 PM

## 2022-04-27 NOTE — Progress Notes (Addendum)
Inpatient Diabetes Program Recommendations  AACE/ADA: New Consensus Statement on Inpatient Glycemic Control (2015)  Target Ranges:  Prepandial:   less than 140 mg/dL      Peak postprandial:   less than 180 mg/dL (1-2 hours)      Critically ill patients:  140 - 180 mg/dL   Lab Results  Component Value Date   GLUCAP 195 (H) 04/27/2022   HGBA1C 12.5 (H) 04/10/2022    Review of Glycemic Control  Latest Reference Range & Units 04/26/22 10:48 04/26/22 16:30 04/26/22 19:37 04/26/22 23:20 04/27/22 03:30 04/27/22 07:46  Glucose-Capillary 70 - 99 mg/dL 224 (H) 162 (H) 164 (H) 193 (H) 194 (H) 195 (H)  Diabetes history: DM2 Outpatient Diabetes medications: Jardiance 10 mg daily Current orders for Inpatient glycemic control:  Novolog 0-20 units q 4 hours Novolog 10 units q 4 hours Semglee 38 units bid Vital 60 ml/hr  Inpatient Diabetes Program Recommendations:    Consider increasing Semglee to 40 units bid and increase Novolog tube feed coverage to 12 units q 4 hours.   Thanks,  Adah Perl, RN, BC-ADM Inpatient Diabetes Coordinator Pager [phone]  (8a-5p)

## 2022-04-27 NOTE — Evaluation (Signed)
Physical Therapy Evaluation Patient Details Name: Mathew Lara MRN: [id number] DOB: Jul 12, 1961 Today's Date: 04/27/2022  History of Present Illness  Pt is 61 yo male presenting to [facility] ED on 8/5 with abdominal pain. ED work-up revealed DKA, acute pancreatitis, AKI. CT 8/5 compatible with acute pancreatitis. Overnight 8/5 developed continuing respiratory failure and sock. Transfer to [facility]. Placed on BiPAP. Started on CRRT 04/13/22 - 04/25/22. Repeat CT abd pelvis 8/14 with edematous pancreas but no evidence of necrotizing pancreatitis. PMH including cholecystectomy (March 23, 2021), alcohol abuse, anxiety, CKD, COPD, diabetes mellitus type 2, essential hypertension, hepatitis C, mixed hyperlipidemia.  Clinical Impression  Pt admitted with above diagnosis. At baseline, pt ambulates with cane and resides with sister.  Pt seen for evaluation today and required mod -max A of 2 for bed mobility and transfer to EOB.  Unable to progress OOB due to symptomatic orthostatic hypotension that did not improve with time and exercises. Pt was on bipap and with some confusion -difficult to assess strength, but pt was able to move all extremities.  Pt currently with functional limitations due to the deficits listed below (see PT Problem List). Pt will benefit from skilled PT to increase their independence and safety with mobility to allow discharge to the venue listed below.          Recommendations for follow up therapy are one component of a multi-disciplinary discharge planning process, led by the attending physician.  Recommendations may be updated based on patient status, additional functional criteria and insurance authorization.  Follow Up Recommendations Skilled nursing-short term rehab (<3 hours/day) Can patient physically be transported by private vehicle: No    Assistance Recommended at Discharge Frequent or constant Supervision/Assistance  Patient can return home with the following  Assist  for transportation;Assistance with cooking/housework;Help with stairs or ramp for entrance;Two people to help with walking and/or transfers;Two people to help with bathing/dressing/bathroom    Equipment Recommendations Other (comment) (defer to post acute or progress)  Recommendations for Other Services       Functional Status Assessment Patient has had a recent decline in their functional status and demonstrates the ability to make significant improvements in function in a reasonable and predictable amount of time.     Precautions / Restrictions Precautions Precautions: Fall;Other (comment) Precaution Comments: NG tube, BiPAP      Mobility  Bed Mobility Overal bed mobility: Needs Assistance Bed Mobility: Supine to Sit, Sit to Supine     Supine to sit: +2 for physical assistance, +2 for safety/equipment, HOB elevated, Mod assist Sit to supine: Max assist, +2 for physical assistance, +2 for safety/equipment, HOB elevated   General bed mobility comments: Mod A for elevatring trunk and scooting hips forward. Able to managing BLEs towards EOB. Max A +2 for return to supine    Transfers                   General transfer comment: Defer due to BP    Ambulation/Gait                  Stairs            Wheelchair Mobility    Modified Rankin (Stroke Patients Only)       Balance Overall balance assessment: Needs assistance Sitting-balance support: Bilateral upper extremity supported Sitting balance-Leahy Scale: Poor Sitting balance - Comments: requiring UE support and min A at times; EOB 3-5 mins       Standing balance comment: deferred  Pertinent Vitals/Pain Pain Assessment Pain Assessment: Faces Faces Pain Scale: Hurts little more Pain Location: Back Pain Descriptors / Indicators: Discomfort, Grimacing Pain Intervention(s): Limited activity within patient's tolerance, Monitored during session, Repositioned     Home Living Family/patient expects to be discharged to:: Private residence Living Arrangements: Other relatives (sister) Available Help at Discharge: Family Type of Home: House Home Access: Level entry       Home Layout: One level Home Equipment: Kasandra Knudsen - single point Additional Comments: Daughter arriving after session and providing information    Prior Function Prior Level of Function : Independent/Modified Independent             Mobility Comments: Occasionally using cane ADLs Comments: Performing ADLs and simple IADLs. His sister and daughter assist with driving. Daughter manages medications.     Hand Dominance   Dominant Hand: Right    Extremity/Trunk Assessment   Upper Extremity Assessment Upper Extremity Assessment: Generalized weakness;Difficult to assess due to impaired cognition    Lower Extremity Assessment Lower Extremity Assessment: Generalized weakness;Difficult to assess due to impaired cognition (Demonstrating knee extension and ankle dorsiflexion 3/5; ROM WFL)    Cervical / Trunk Assessment Cervical / Trunk Assessment: Other exceptions Cervical / Trunk Exceptions: Forward rounding of shoulder  Communication   Communication: Other (comment) (soft spoken - on bipap)  Cognition Arousal/Alertness: Lethargic Behavior During Therapy: Flat affect Overall Cognitive Status: Difficult to assess                                 General Comments: Oriented to self, place, and year. Lethargic and fatigued. Falling asleep during conversation. Difficulty following commands. Keeping eyes closed for majority of session        General Comments General comments (skin integrity, edema, etc.): BP supine 97/76 (83), sitting EOB 83/65 (72), supine 89/53 (64), and supine after 4 mins 87/63 (70).  Pt less reponsive and pale at EOB so with low BP returned to bed    Exercises General Exercises - Lower Extremity Long Arc Quad: AROM, Both, 5 reps, Seated    Assessment/Plan    PT Assessment Patient needs continued PT services  PT Problem List Decreased strength;Decreased mobility;Decreased safety awareness;Decreased activity tolerance;Cardiopulmonary status limiting activity;Decreased cognition;Decreased balance;Decreased knowledge of use of DME;Decreased coordination;Decreased knowledge of precautions       PT Treatment Interventions DME instruction;Therapeutic activities;Gait training;Therapeutic exercise;Patient/family education;Balance training;Functional mobility training;Neuromuscular re-education;Cognitive remediation    PT Goals (Current goals can be found in the Care Plan section)  Acute Rehab PT Goals Patient Stated Goal: unable to state other than wants bipap off PT Goal Formulation: With patient Time For Goal Achievement: 05/11/22 Potential to Achieve Goals: Fair    Frequency Min 2X/week     Co-evaluation PT/OT/SLP Co-Evaluation/Treatment: Yes Reason for Co-Treatment: Complexity of the patient's impairments (multi-system involvement);For patient/therapist safety PT goals addressed during session: Mobility/safety with mobility OT goals addressed during session: ADL's and self-care       AM-PAC PT "6 Clicks" Mobility  Outcome Measure Help needed turning from your back to your side while in a flat bed without using bedrails?: A Lot Help needed moving from lying on your back to sitting on the side of a flat bed without using bedrails?: A Lot Help needed moving to and from a bed to a chair (including a wheelchair)?: Total Help needed standing up from a chair using your arms (e.g., wheelchair or bedside chair)?: Total Help needed to  walk in hospital room?: Total Help needed climbing 3-5 steps with a railing? : Total 6 Click Score: 8    End of Session Equipment Utilized During Treatment: Oxygen Activity Tolerance: Patient limited by lethargy;Treatment limited secondary to medical complications (Comment) Patient left: in  bed;with call bell/phone within reach;with bed alarm set Nurse Communication: Mobility status PT Visit Diagnosis: Other abnormalities of gait and mobility (R26.89);Muscle weakness (generalized) (M62.81)    Time: 1415-9733 PT Time Calculation (min) (ACUTE ONLY): 23 min   Charges:   PT Evaluation $PT Eval Moderate Complexity: 1 Mod          Charlett Merkle, PT Acute Rehab [facility] [phone]   Karlton Lemon 04/27/2022, 3:04 PM

## 2022-04-27 NOTE — Progress Notes (Signed)
Pt. Removed himself from Bipap RT at bedside placed pt. On HFNC . Pt. Tolerated well, RN notified

## 2022-04-27 NOTE — Progress Notes (Signed)
[place] Progress Note Patient Name: Mathew Lara DOB: 08-24-1961 MRN: [id number]   Date of Service  04/27/2022  HPI/Events of Note  RN reports increased confusion and forgetfulness with pulling of leads and pulse ox. Gross neuro exam nonfocal  Currently tolerating BiPAP  eICU Interventions  Continue BiPAP overnight If self-discontinues BiPAP, consider repeat ABG     Intervention Category Minor Interventions: Other:  [name] 04/27/2022, 2:03 AM

## 2022-04-27 NOTE — Progress Notes (Signed)
RN placed pt back on BiPAP. Pt tolerating well with SVS. RT will continue to monitor pt.

## 2022-04-28 ENCOUNTER — Inpatient Hospital Stay (HOSPITAL_COMMUNITY): Payer: Medicare Other

## 2022-04-28 DIAGNOSIS — E1110 Type 2 diabetes mellitus with ketoacidosis without coma: Secondary | ICD-10-CM | POA: Diagnosis not present

## 2022-04-28 DIAGNOSIS — R609 Edema, unspecified: Secondary | ICD-10-CM

## 2022-04-28 LAB — RENAL FUNCTION PANEL
Albumin: 2.4 g/dL — ABNORMAL LOW (ref 3.5–5.0)
Albumin: 2.4 g/dL — ABNORMAL LOW (ref 3.5–5.0)
Anion gap: 15 (ref 5–15)
Anion gap: 16 — ABNORMAL HIGH (ref 5–15)
BUN: 58 mg/dL — ABNORMAL HIGH (ref 8–23)
BUN: 33 mg/dL — ABNORMAL HIGH (ref 8–23)
CO2: 23 mmol/L (ref 22–32)
CO2: 24 mmol/L (ref 22–32)
Calcium: 9.4 mg/dL (ref 8.9–10.3)
Calcium: 9 mg/dL (ref 8.9–10.3)
Chloride: 95 mmol/L — ABNORMAL LOW (ref 98–111)
Chloride: 91 mmol/L — ABNORMAL LOW (ref 98–111)
Creatinine, Ser: 8.6 mg/dL — ABNORMAL HIGH (ref 0.61–1.24)
Creatinine, Ser: 5.69 mg/dL — ABNORMAL HIGH (ref 0.61–1.24)
GFR, Estimated: 6 mL/min — ABNORMAL LOW (ref >60)
GFR, Estimated: 11 mL/min — ABNORMAL LOW (ref >60)
Glucose, Bld: 290 mg/dL — ABNORMAL HIGH (ref 70–99)
Glucose, Bld: 305 mg/dL — ABNORMAL HIGH (ref 70–99)
Phosphorus: 6.6 mg/dL — ABNORMAL HIGH (ref 2.5–4.6)
Phosphorus: 4.9 mg/dL — ABNORMAL HIGH (ref 2.5–4.6)
Potassium: 4.7 mmol/L (ref 3.5–5.1)
Potassium: 4.1 mmol/L (ref 3.5–5.1)
Sodium: 133 mmol/L — ABNORMAL LOW (ref 135–145)
Sodium: 131 mmol/L — ABNORMAL LOW (ref 135–145)

## 2022-04-28 LAB — POCT I-STAT 7, (LYTES, BLD GAS, ICA,H+H)
Acid-Base Excess: 0 mmol/L (ref 0.0–2.0)
Bicarbonate: 23.6 mmol/L (ref 20.0–28.0)
Calcium, Ion: 1.14 mmol/L — ABNORMAL LOW (ref 1.15–1.40)
HCT: 25 % — ABNORMAL LOW (ref 39.0–52.0)
Hemoglobin: 8.5 g/dL — ABNORMAL LOW (ref 13.0–17.0)
O2 Saturation: 99 %
Patient temperature: 99.6
Potassium: 4.3 mmol/L (ref 3.5–5.1)
Sodium: 132 mmol/L — ABNORMAL LOW (ref 135–145)
TCO2: 25 mmol/L (ref 22–32)
pCO2 arterial: 34.5 mmHg (ref 32–48)
pH, Arterial: 7.445 (ref 7.35–7.45)
pO2, Arterial: 115 mmHg — ABNORMAL HIGH (ref 83–108)

## 2022-04-28 LAB — GLUCOSE, CAPILLARY
Glucose-Capillary: 150 mg/dL — ABNORMAL HIGH (ref 70–99)
Glucose-Capillary: 252 mg/dL — ABNORMAL HIGH (ref 70–99)
Glucose-Capillary: 276 mg/dL — ABNORMAL HIGH (ref 70–99)
Glucose-Capillary: 310 mg/dL — ABNORMAL HIGH (ref 70–99)
Glucose-Capillary: 313 mg/dL — ABNORMAL HIGH (ref 70–99)
Glucose-Capillary: 315 mg/dL — ABNORMAL HIGH (ref 70–99)
Glucose-Capillary: 377 mg/dL — ABNORMAL HIGH (ref 70–99)

## 2022-04-28 LAB — MAGNESIUM: Magnesium: 2.6 mg/dL — ABNORMAL HIGH (ref 1.7–2.4)

## 2022-04-28 MED ORDER — HEPARIN SODIUM (PORCINE) 1000 UNIT/ML IJ SOLN
INTRAMUSCULAR | Status: AC
  Filled 2022-04-28: qty 4

## 2022-04-28 MED ORDER — INSULIN ASPART 100 UNIT/ML IJ SOLN
14.0000 [IU] | INTRAMUSCULAR | Status: DC
  Administered 2022-04-28 – 2022-04-29 (×6): 14 [IU] via SUBCUTANEOUS

## 2022-04-28 MED ORDER — FAMOTIDINE 20 MG PO TABS
20.0000 mg | ORAL_TABLET | Freq: Every day | ORAL | Status: DC
Start: 2022-04-29 — End: 2022-05-01
  Administered 2022-04-29 – 2022-05-01 (×3): 20 mg
  Filled 2022-04-28 (×4): qty 1

## 2022-04-28 MED ORDER — CALCIUM CARBONATE ANTACID 500 MG PO CHEW
1.0000 | CHEWABLE_TABLET | Freq: Three times a day (TID) | ORAL | Status: DC | PRN
Start: 2022-04-28 — End: 2022-05-07
  Administered 2022-04-28 – 2022-05-05 (×4): 200 mg
  Filled 2022-04-28 (×4): qty 1

## 2022-04-28 MED ORDER — GABAPENTIN 250 MG/5ML PO SOLN
100.0000 mg | Freq: Three times a day (TID) | ORAL | Status: DC | PRN
  Administered 2022-04-29 – 2022-05-05 (×5): 100 mg
  Filled 2022-04-28 (×7): qty 2

## 2022-04-28 MED ORDER — ALBUMIN HUMAN 25 % IV SOLN
INTRAVENOUS | Status: AC
  Filled 2022-04-28: qty 100

## 2022-04-28 MED ORDER — SODIUM BICARBONATE 650 MG PO TABS
650.0000 mg | ORAL_TABLET | Freq: Once | ORAL | Status: AC
  Administered 2022-04-28: 650 mg
  Filled 2022-04-28: qty 1

## 2022-04-28 MED ORDER — RENA-VITE PO TABS
1.0000 | ORAL_TABLET | Freq: Every day | ORAL | Status: DC
Start: 2022-04-28 — End: 2022-05-07
  Administered 2022-04-28 – 2022-05-05 (×8): 1
  Filled 2022-04-28 (×9): qty 1

## 2022-04-28 MED ORDER — INSULIN GLARGINE-YFGN 100 UNIT/ML ~~LOC~~ SOLN
50.0000 [IU] | Freq: Two times a day (BID) | SUBCUTANEOUS | Status: DC
  Administered 2022-04-28 (×2): 50 [IU] via SUBCUTANEOUS
  Filled 2022-04-28 (×4): qty 0.5

## 2022-04-28 MED ORDER — PANCRELIPASE (LIP-PROT-AMYL) 10440-39150 UNITS PO TABS
20880.0000 [IU] | ORAL_TABLET | Freq: Once | ORAL | Status: AC
  Administered 2022-04-28: 20880 [IU]
  Filled 2022-04-28: qty 2

## 2022-04-28 MED ORDER — NOREPINEPHRINE 4 MG/250ML-% IV SOLN
INTRAVENOUS | Status: AC
  Filled 2022-04-28: qty 250

## 2022-04-28 MED ORDER — POLYETHYLENE GLYCOL 3350 17 G PO PACK
17.0000 g | PACK | Freq: Every day | ORAL | Status: DC | PRN
Start: 2022-04-28 — End: 2022-04-28

## 2022-04-28 MED ORDER — NOREPINEPHRINE 4 MG/250ML-% IV SOLN
2.0000 ug/min | INTRAVENOUS | Status: DC
  Administered 2022-04-28: 11 ug/min via INTRAVENOUS
  Administered 2022-04-28: 4 ug/min via INTRAVENOUS
  Administered 2022-04-29: 8 ug/min via INTRAVENOUS
  Administered 2022-04-29: 5 ug/min via INTRAVENOUS
  Administered 2022-04-30: 1 ug/min via INTRAVENOUS
  Administered 2022-04-30: 4 ug/min via INTRAVENOUS
  Administered 2022-05-01: 3 ug/min via INTRAVENOUS
  Filled 2022-04-28 (×6): qty 250

## 2022-04-28 MED ORDER — ENSURE ENLIVE PO LIQD
237.0000 mL | Freq: Three times a day (TID) | ORAL | Status: DC
Start: 2022-04-28 — End: 2022-05-03
  Administered 2022-04-29 – 2022-05-01 (×3): 237 mL
  Administered 2022-05-03: 100 mL

## 2022-04-28 MED ORDER — POLYETHYLENE GLYCOL 3350 17 G PO PACK
17.0000 g | PACK | Freq: Every day | ORAL | Status: DC | PRN
Start: 2022-04-28 — End: 2022-05-07

## 2022-04-28 NOTE — Procedures (Signed)
I was present at this dialysis session. I have reviewed the session itself and made appropriate changes.   2K UF goal 1L. Having high VP with HD cath, trying to get through tx.  On BiPAP at this time.  Tentative next HD 8/25  Filed Weights   04/26/22 0500 04/26/22 1352 04/27/22 0500  Weight: 120.8 kg 120.8 kg 119.8 kg    Recent Labs  Lab 04/28/22 0338  NA 133*  K 4.7  CL 95*  CO2 23  GLUCOSE 290*  BUN 58*  CREATININE 8.60*  CALCIUM 9.4  PHOS 6.6*    Recent Labs  Lab 04/24/22 0547 04/25/22 0255 04/25/22 0534 04/26/22 0356  WBC 22.0* 21.6*  --  22.2*  HGB 9.6* 9.0* 8.5* 8.3*  HCT 32.2* 29.6* 25.0* 27.6*  MCV 88.7 88.4  --  89.0  PLT 355 322  --  313    Scheduled Meds:  amoxicillin-clavulanate  500 mg Per Tube Q12H   arformoterol  15 mcg Nebulization BID   atorvastatin  80 mg Per Tube Daily   budesonide (PULMICORT) nebulizer solution  0.5 mg Nebulization BID   Chlorhexidine Gluconate Cloth  6 each Topical Daily   famotidine  20 mg Oral Daily   feeding supplement  237 mL Oral TID BM   feeding supplement (PROSource TF20)  60 mL Per Tube BID   heparin  5,000 Units Subcutaneous Q8H   heparin sodium (porcine)       insulin aspart  0-20 Units Subcutaneous Q4H   insulin aspart  14 Units Subcutaneous Q4H   insulin glargine-yfgn  50 Units Subcutaneous BID   midodrine  15 mg Per Tube Q8H   multivitamin  1 tablet Oral QHS   mouth rinse  15 mL Mouth Rinse 4 times per day   polyethylene glycol  17 g Oral Daily   simethicone  80 mg Per Tube QID   sodium chloride flush  10-40 mL Intracatheter Q12H   Continuous Infusions:  sodium chloride Stopped (04/21/22 1243)   albumin human     feeding supplement (VITAL 1.5 CAL) Stopped (04/28/22 0920)   PRN Meds:.sodium chloride, acetaminophen, albumin human, albuterol, calcium carbonate, dextrose, gabapentin, heparin sodium (porcine), ondansetron (ZOFRAN) IV, mouth rinse, oxyCODONE, sodium chloride flush, white petrolatum   Pearson Grippe  MD 04/28/2022, 9:57 AM

## 2022-04-28 NOTE — Progress Notes (Signed)
NAME:  Mathew Lara, MRN:  [id number], DOB:  06/28/1961, LOS: 60 ADMISSION DATE:  04/10/2022, CONSULTATION DATE:  8/5 REFERRING MD:  Darrell Jewel, CHIEF COMPLAINT: Abdominal pain  History of Present Illness:  Mathew Lara is an 61 y.o. M who presented to the [facility] on 8/5 with a chief complaint of abdominal pain.  They have a past medical history of alcohol abuse, anxiety, CKD 3, COPD, OSA, type 2 diabetes, hypertension, hyperlipidemia, hep C+  ED work-up revealed DKA, acute pancreatitis, AKI.  He was admitted to the hospital service.  Overnight 8/5 he developed continuing respiratory distress, shock.   PCCM was consulted for transfer to [facility] for further management.   Pertinent  Medical History  Alcohol abuse, anxiety, CKD 3, COPD, OSA, type 2 diabetes, hypertension, hyperlipidemia, hep C  Significant Hospital Events: Including procedures, antibiotic start and stop dates in addition to other pertinent events   8/5 Presented to [facility] ED, transferred [facility] 8/7 On pressors  8/8 BiPAP, worsening renal parameters 8/9 Tolerating CRRT, on BiPAP 8/10 has been off BiPAP, on high flow oxygen and tolerating 8/11 UOP improving. CRRT with net even UF 8/14 CT reveals worsening cavitary pneumonia and acute pancreatitis, large BM 8/15 abx changed for cavitary pneumonia 8/16 ID consult.  ID stopped vancomycin, Zosyn, azithromycin >narrowed to augmentin. CRRT stopped.   8/17 CRRT restarted.  Off pressors 8/19 Remains off pressors, CRRT ongoing. HD cath required tPA.  8/21 iHD, off CRRT  Interim History / Subjective:  Afebrile Output 800 iHD,  -8.71 L net admission BiPAP overnight  Objective   Blood pressure (!) 101/58, pulse (!) 120, temperature 98.4 F (36.9 C), temperature source Axillary, resp. rate (!) 22, height '5\' 8"'$  (1.727 m), weight 119.8 kg, SpO2 97 %.    Vent Mode: PCV;BIPAP FiO2 (%):  [40 %-50 %] 40 % Set Rate:  [16 bmp] 16 bmp PEEP:  [5 cmH20] 5  cmH20   Intake/Output Summary (Last 24 hours) at 04/28/2022 4098 Last data filed at 04/28/2022 0600 Gross per 24 hour  Intake 1480 ml  Output 35 ml  Net 1445 ml    Filed Weights   04/26/22 0500 04/26/22 1352 04/27/22 0500  Weight: 120.8 kg 120.8 kg 119.8 kg   Physical Exam: General: adult male lying in bed in NAD HEENT: MM pink/moist, R IJ HD cath, anicteric  Neuro: AAOx3, speech clear CV: s1s2 RRR, no m/r/g PULM: non-labored at rest, CTABL GI: soft, normal BS, cortrak in place   Extremities: warm/dry, no edema, cap refill < 2 sec Skin: no rashes or lesions  Resolved Hospital Problem list   Multifactorial shock  Assessment & Plan:   LUL Cavitary Pneumonia Paraseptal Emphysema Underlying OSA Hx post-infectious scarring - no ILD on CT BC negative. Cavitary PNA on CT chest never treated. Has been on/off bipap. ID has low concern for fungal etiology. Afebrile and BiPAP yesterday evening/overnight. Echo showed normal EF with grade I diastolic dysfunction, otherwise normal. Patient on HFNC 25 L 40% FiO2.  -wean HHFNC for sats 88-94% -pulmonary hygiene -IS, mobilize -brovana, pulmicort + PRN albuterol  -appreciate ID  -augmentin (8/14-9/4) -QHS & PRN daytime sleep bipap  Acute Metabolic Encephalopathy  Improved with BiPAP, suspect hypercarbic failure +/- hypoactive delirium and sepsis. CT head neg. Alert and oriented x 3 today (person/place/day), clear speech.  -PT efforts  -supportive care  -frequent reorientation, promote sleep / wake cycle   Multifactorial Shock  Shock resolved, on midodrine. BP mostly appropriate  overnight. -midodrine '15mg'$  TID -abx as above   AKI on CKD 2/2 ATN in setting of acute pancreatitis and shock CRRT started 8/17. Cr worsened to 8.60 from 6.89. K 4.7 (4.5), Phos 6.6 (6.2) this morning. 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 RUQ [place] 04/10/22 - CBD dilatation 10.8 mm. Ultrasound abdomen 8/16: Common bile duct measures 7 mm decreased from 10 mm, fatty liver.  GI feels that ultrasound shows normalizing of the CBD, thus may have pass the gallstone. Per GI, not a candidate for MRCP. Triglycerides 407 increased from 358, and continues to rise/fall and remain around 400's. Patient poor candidate for fibrates b/c of renal function, started statin. Tube feeds at goal.  -diet Clear liquid w/ trickle feeds at 60 cc/hr  -PRN tylenol  -atorvastatin   DM II  DKA - resolved Glucose range 195-296. Patient received 80 units of short acting outside of TF coverage yesterday. Likely needing more coverage as tube feeds are now at goal.  -SSI, resistant scale -increase TF coverage to 14 units Q4   -Semglee 50 units BID    History of diastolic heart failure Echo 12/25/2019 revealed ejection fraction of 60 to 65%. Repeat echo showed normal EF with grade I diastolic dysfunction. Patient appears euvolemic on exam today.  -tele monitoring   Constipation Status post methylnaltrexone on 8/14 and smog enema. Patient had loose stools overnight, colace stopped.  -bowel regimen  -trickle TF & clear liquid diet  -minimize narcotics as able   Hiccups -gabapentin 100 mg TID PRN  Best Practice (right click and "Reselect all SmartList Selections" daily)  Diet/type: tubefeeds and clear liquids DVT prophylaxis: prophylactic heparin  GI prophylaxis: N/A Lines: Dialysis Catheter and yes and it is still needed Foley:  N/A Code Status:  full code Last date of multidisciplinary goals of care discussion: Patient updated 8/22. Family updated at bedside 8/19 on plan of care.  Critical care time: 30 minutes   Holley Bouche, MD PGY-2 [facility] 04/28/2022, 7:07 AM   Please see [email or web address] for pager details.   From 7A-7P if no response, please call [phone] After hours, please call ELink [phone]

## 2022-04-28 NOTE — Progress Notes (Addendum)
Inpatient Diabetes Program Recommendations  AACE/ADA: New Consensus Statement on Inpatient Glycemic Control (2015)  Target Ranges:  Prepandial:   less than 140 mg/dL      Peak postprandial:   less than 180 mg/dL (1-2 hours)      Critically ill patients:  140 - 180 mg/dL   Lab Results  Component Value Date   GLUCAP 310 (H) 04/28/2022   HGBA1C 12.5 (H) 04/10/2022    Review of Glycemic Control  Latest Reference Range & Units 04/27/22 11:43 04/27/22 15:20 04/27/22 19:45 04/27/22 23:35 04/28/22 03:26 04/28/22 07:47  Glucose-Capillary 70 - 99 mg/dL 265 (H) 237 (H) 296 (H) 291 (H) 276 (H) 310 (H)  Diabetes history: DM2 Outpatient Diabetes medications: Jardiance 10 mg daily Current orders for Inpatient glycemic control:  Novolog 0-20 units q 4 hours Novolog 12 units q 4 hours Semglee 40 units bid Vital 60 ml/hr Inpatient Diabetes Program Recommendations:    Consider increasing Novolog tube feed coverage to 15 units q 4 hours.    Thanks,  Adah Perl, RN, BC-ADM Inpatient Diabetes Coordinator Pager [phone]  (8a-5p)

## 2022-04-28 NOTE — Progress Notes (Addendum)
Cortrak Tube Team Note:  Consult received to unclog a Cortrak feeding tube.  Tube unclogged and secured in L nare with bridle at 103 cm.   RN discussed with MD, ok to use Cortrak tube.   If the tube becomes dislodged please keep the tube and contact the Cortrak team at [email or web address] ([email or web address]) for replacement.  If after hours and replacement cannot be delayed, place a NG tube and confirm placement with an abdominal x-ray.    Lockie Pares., RD, LDN, CNSC See [email or web address] for contact information

## 2022-04-28 NOTE — Progress Notes (Signed)
Bilateral LE venous duplex study completed. Please see CV Proc for preliminary results.  Rori Goar BS, RVT 04/28/2022 2:14 PM

## 2022-04-28 NOTE — Progress Notes (Signed)
Hd tx of 3.5hrs completed. 74.3L total vol processed. No complications. Report given to anna clinton, rn Total uf removed: 1016m Post hd wt: 121kg  Post hd v/s: 97.8 95/84(89) 123 19 96%

## 2022-04-29 ENCOUNTER — Inpatient Hospital Stay (HOSPITAL_COMMUNITY): Payer: Medicare Other

## 2022-04-29 DIAGNOSIS — E1110 Type 2 diabetes mellitus with ketoacidosis without coma: Secondary | ICD-10-CM | POA: Diagnosis not present

## 2022-04-29 LAB — BASIC METABOLIC PANEL
Anion gap: 14 (ref 5–15)
Anion gap: 18 — ABNORMAL HIGH (ref 5–15)
BUN: 52 mg/dL — ABNORMAL HIGH (ref 8–23)
BUN: 57 mg/dL — ABNORMAL HIGH (ref 8–23)
CO2: 26 mmol/L (ref 22–32)
CO2: 24 mmol/L (ref 22–32)
Calcium: 9.5 mg/dL (ref 8.9–10.3)
Calcium: 9.3 mg/dL (ref 8.9–10.3)
Chloride: 93 mmol/L — ABNORMAL LOW (ref 98–111)
Chloride: 91 mmol/L — ABNORMAL LOW (ref 98–111)
Creatinine, Ser: 8.4 mg/dL — ABNORMAL HIGH (ref 0.61–1.24)
Creatinine, Ser: 9.08 mg/dL — ABNORMAL HIGH (ref 0.61–1.24)
GFR, Estimated: 7 mL/min — ABNORMAL LOW (ref >60)
GFR, Estimated: 6 mL/min — ABNORMAL LOW (ref >60)
Glucose, Bld: 254 mg/dL — ABNORMAL HIGH (ref 70–99)
Glucose, Bld: 222 mg/dL — ABNORMAL HIGH (ref 70–99)
Potassium: 4.3 mmol/L (ref 3.5–5.1)
Potassium: 4.3 mmol/L (ref 3.5–5.1)
Sodium: 133 mmol/L — ABNORMAL LOW (ref 135–145)
Sodium: 133 mmol/L — ABNORMAL LOW (ref 135–145)

## 2022-04-29 LAB — GLUCOSE, CAPILLARY
Glucose-Capillary: 385 mg/dL — ABNORMAL HIGH (ref 70–99)
Glucose-Capillary: 348 mg/dL — ABNORMAL HIGH (ref 70–99)
Glucose-Capillary: 362 mg/dL — ABNORMAL HIGH (ref 70–99)
Glucose-Capillary: 303 mg/dL — ABNORMAL HIGH (ref 70–99)
Glucose-Capillary: 338 mg/dL — ABNORMAL HIGH (ref 70–99)
Glucose-Capillary: 233 mg/dL — ABNORMAL HIGH (ref 70–99)
Glucose-Capillary: 240 mg/dL — ABNORMAL HIGH (ref 70–99)
Glucose-Capillary: 266 mg/dL — ABNORMAL HIGH (ref 70–99)
Glucose-Capillary: 228 mg/dL — ABNORMAL HIGH (ref 70–99)
Glucose-Capillary: 184 mg/dL — ABNORMAL HIGH (ref 70–99)
Glucose-Capillary: 166 mg/dL — ABNORMAL HIGH (ref 70–99)
Glucose-Capillary: 213 mg/dL — ABNORMAL HIGH (ref 70–99)
Glucose-Capillary: 217 mg/dL — ABNORMAL HIGH (ref 70–99)
Glucose-Capillary: 245 mg/dL — ABNORMAL HIGH (ref 70–99)

## 2022-04-29 LAB — RENAL FUNCTION PANEL
Albumin: 2.2 g/dL — ABNORMAL LOW (ref 3.5–5.0)
Anion gap: 15 (ref 5–15)
BUN: 42 mg/dL — ABNORMAL HIGH (ref 8–23)
CO2: 24 mmol/L (ref 22–32)
Calcium: 9 mg/dL (ref 8.9–10.3)
Chloride: 89 mmol/L — ABNORMAL LOW (ref 98–111)
Creatinine, Ser: 7.13 mg/dL — ABNORMAL HIGH (ref 0.61–1.24)
GFR, Estimated: 8 mL/min — ABNORMAL LOW (ref >60)
Glucose, Bld: 417 mg/dL — ABNORMAL HIGH (ref 70–99)
Phosphorus: 5.3 mg/dL — ABNORMAL HIGH (ref 2.5–4.6)
Potassium: 4.4 mmol/L (ref 3.5–5.1)
Sodium: 128 mmol/L — ABNORMAL LOW (ref 135–145)

## 2022-04-29 LAB — BLASTOMYCES ANTIGEN: Blastomyces Antigen: NOT DETECTED ng/mL

## 2022-04-29 LAB — MAGNESIUM: Magnesium: 2.1 mg/dL (ref 1.7–2.4)

## 2022-04-29 LAB — PROCALCITONIN: Procalcitonin: 2.89 ng/mL

## 2022-04-29 MED ORDER — INSULIN REGULAR(HUMAN) IN NACL 100-0.9 UT/100ML-% IV SOLN
INTRAVENOUS | Status: DC
  Administered 2022-04-29: 11.5 [IU]/h via INTRAVENOUS
  Administered 2022-04-29: 13 [IU]/h via INTRAVENOUS
  Administered 2022-04-30: 20 [IU]/h via INTRAVENOUS
  Administered 2022-04-30: 30 [IU]/h via INTRAVENOUS
  Administered 2022-04-30: 23 [IU]/h via INTRAVENOUS
  Administered 2022-04-30: 25 [IU]/h via INTRAVENOUS
  Administered 2022-04-30: 20 [IU]/h via INTRAVENOUS
  Filled 2022-04-29 (×7): qty 100

## 2022-04-29 MED ORDER — MIDODRINE HCL 5 MG PO TABS
20.0000 mg | ORAL_TABLET | Freq: Three times a day (TID) | ORAL | Status: DC
Start: 2022-04-29 — End: 2022-05-01
  Administered 2022-04-29 – 2022-05-01 (×6): 20 mg
  Filled 2022-04-29 (×6): qty 4

## 2022-04-29 MED ORDER — DEXTROSE 50 % IV SOLN: 0.0000 mL | INTRAVENOUS | Status: DC | PRN

## 2022-04-29 NOTE — Progress Notes (Signed)
Inpatient Diabetes Program Recommendations  AACE/ADA: New Consensus Statement on Inpatient Glycemic Control (2015)  Target Ranges:  Prepandial:   less than 140 mg/dL      Peak postprandial:   less than 180 mg/dL (1-2 hours)      Critically ill patients:  140 - 180 mg/dL   Lab Results  Component Value Date   GLUCAP 362 (H) 04/29/2022   HGBA1C 12.5 (H) 04/10/2022    Review of Glycemic Control  Latest Reference Range & Units 04/28/22 19:33 04/28/22 23:36 04/29/22 03:06 04/29/22 07:46 04/29/22 11:14  Glucose-Capillary 70 - 99 mg/dL 315 (H) 377 (H) 385 (H) 348 (H) 362 (H)  Diabetes history: DM2 Outpatient Diabetes medications: Jardiance 10 mg daily Current orders for Inpatient glycemic control:  IV insulin - goal 140-180 mg/dL Vital 60 ml/hr  Inpatient Diabetes Program Recommendations:    Blood glucose has been difficult to control despite increases made daily in subcutaneous insulin.  Agree with start of IV insulin.  Hopefully this will give better estimation of insulin needs once patient is ready for transition.  Will follow.   Thanks,  Adah Perl, RN, BC-ADM Inpatient Diabetes Coordinator Pager [phone]  (8a-5p)

## 2022-04-29 NOTE — Progress Notes (Signed)
Pt placed on Bipap while sleeping and is tolerating it well at this time.

## 2022-04-29 NOTE — Progress Notes (Signed)
[facility] Progress Note   Assessment/ Plan:   Anuric dialysis dependent AKI/CKD stage IIIb-IV - presumably ischemic ATN in setting of acute pancreatitis, hypotension, pressor support, with concomitant ARB, SGLT-2 inhibitor and finerenone.  Initially non-oliguric but now UOP has stopped Continue to hold losartan, Jardiance, and [place].   Started on CRRT 04/13/22 - 04/25/22.   RIJ temp HD cath placed 04/13/22.   Start iHD 8/21, tolerating on MWF schedule HD Rx: midodrine and bolus albumin: 2K, 1-2L UF,  Plan to remove temp cath 8/25 post HD for holiday, hopeful to place [facility] thereafter  Acute pancreatitis: resolved  Shock -on off pressors per PCCM; 20 TID Midodrin Acute hypoxic respiratory failure - with underlying COPD and OSA.  Per PCCM Hep C with advanced liver fibrosis Chronic diastolic CHF - stable. DM2  Subjective:    HD yesterday, 1L UF More awake and interactive this AM Req low dose NE on/off No sig UOP    Objective:   BP (!) 82/67   Pulse (!) 111   Temp 99.9 F (37.7 C) (Oral)   Resp 18   Ht '5\' 8"'$  (1.727 m)   Wt 121.7 kg   SpO2 97%   BMI 40.79 kg/m   Physical Exam: ZWC:HENID, not very alert or engaged CVS: tachycardic Resp: CTAB on HFNC Abd: soft, obese, somewhat distended, mildly tender Ext: no LE edema ACCESS: R IJ nontunneled HD cath  Labs: BMET Recent Labs  Lab 04/26/22 0356 04/26/22 1816 04/27/22 0516 04/27/22 1536 04/28/22 0338 04/28/22 1550 04/29/22 0332  NA 133* 132* 131* 134* 133* 131* 128*  K 5.3* 3.9 4.4 4.5 4.7 4.1 4.4  CL 97* 93* 94* 95* 95* 91* 89*  CO2 20* '26 26 25 23 24 24  '$ GLUCOSE 179* 173* 197* 257* 290* 305* 417*  BUN 44* 26* 36* 46* 58* 33* 42*  CREATININE 5.40* 3.94* 5.69* 6.89* 8.60* 5.69* 7.13*  CALCIUM 9.7 8.9 8.9 9.4 9.4 9.0 9.0  PHOS 4.4 3.1 5.5* 6.2* 6.6* 4.9* 5.3*    CBC Recent Labs  Lab 04/23/22 0345 04/24/22 0547 04/25/22 0255 04/25/22 0534 04/26/22 0356  WBC 24.3* 22.0* 21.6*  --  22.2*   HGB 9.1* 9.6* 9.0* 8.5* 8.3*  HCT 30.3* 32.2* 29.6* 25.0* 27.6*  MCV 88.6 88.7 88.4  --  89.0  PLT 374 355 322  --  313       Medications:     amoxicillin-clavulanate  500 mg Per Tube Q12H   arformoterol  15 mcg Nebulization BID   atorvastatin  80 mg Per Tube Daily   budesonide (PULMICORT) nebulizer solution  0.5 mg Nebulization BID   Chlorhexidine Gluconate Cloth  6 each Topical Daily   famotidine  20 mg Per Tube Daily   feeding supplement  237 mL Per Tube TID BM   feeding supplement (PROSource TF20)  60 mL Per Tube BID   heparin  5,000 Units Subcutaneous Q8H   midodrine  20 mg Per Tube Q8H   multivitamin  1 tablet Per Tube QHS   mouth rinse  15 mL Mouth Rinse 4 times per day   simethicone  80 mg Per Tube QID   sodium chloride flush  10-40 mL Intracatheter Q12H   Rexene Agent, MD 04/29/2022, 11:31 AM

## 2022-04-29 NOTE — Progress Notes (Addendum)
NAME:  MISHON BLUBAUGH, MRN:  [id number], DOB:  10-17-1960, LOS: 52 ADMISSION DATE:  04/10/2022, CONSULTATION DATE:  8/5 REFERRING MD:  Darrell Jewel, CHIEF COMPLAINT: Abdominal pain  History of Present Illness:  JOHNSTON MADDOCKS is an 61 y.o. M who presented to [facility] on 8/5 with a chief complaint of abdominal pain.  They have a past medical history of alcohol abuse, anxiety, CKD 3, COPD, OSA, type 2 diabetes, hypertension, hyperlipidemia, hep C+  ED work-up revealed DKA, acute pancreatitis, AKI.  He was admitted to the hospital service.  Overnight 8/5 he developed continuing respiratory distress, shock.   PCCM was consulted for transfer to [facility] for further management.   Pertinent  Medical History  Alcohol abuse, anxiety, CKD 3, COPD, OSA, type 2 diabetes, hypertension, hyperlipidemia, hep C  Significant Hospital Events: Including procedures, antibiotic start and stop dates in addition to other pertinent events   8/5 Presented to [facility] ED, transferred [facility] 8/7 On pressors  8/8 BiPAP, worsening renal parameters 8/9 Tolerating CRRT, on BiPAP 8/10 has been off BiPAP, on high flow oxygen and tolerating 8/11 UOP improving. CRRT with net even UF 8/14 CT reveals worsening cavitary pneumonia and acute pancreatitis, large BM 8/15 abx changed for cavitary pneumonia 8/16 ID consult.  ID stopped vancomycin, Zosyn, azithromycin >narrowed to augmentin. CRRT stopped.   8/17 CRRT restarted.  Off pressors 8/19 Remains off pressors, CRRT ongoing. HD cath required tPA.  8/21 iHD, off CRRT  Interim History / Subjective:  Patient spiked fever this morning Was restarted on levophed  BiPAP during day and overnight  Objective   Blood pressure (!) 84/60, pulse (!) 116, temperature 98.5 F (36.9 C), temperature source Axillary, resp. rate (!) 24, height '5\' 8"'$  (1.727 m), weight 121.7 kg, SpO2 97 %.    Vent Mode: BIPAP;PCV FiO2 (%):  [40 %-50 %] 50 % Set Rate:  [16 bmp] 16  bmp PEEP:  [5 cmH20] 5 cmH20 Pressure Support:  [5 cmH20] 5 cmH20   Intake/Output Summary (Last 24 hours) at 04/29/2022 1941 Last data filed at 04/29/2022 0600 Gross per 24 hour  Intake 2013.51 ml  Output 1500 ml  Net 513.51 ml    Filed Weights   04/28/22 0800 04/28/22 1248 04/29/22 0500  Weight: 122 kg 121 kg 121.7 kg   Physical Exam: General: adult male lying in bed in NAD HEENT: MM pink/moist, R IJ HD cath, anicteric  Neuro: AAOx3 (person, place, year), speech clear  CV: s1s2 RRR, no m/r/g PULM: non-labored at rest, CTABL GI: soft, normal BS, cortrak in place   Extremities: warm/dry, no edema, cap refill < 2 sec Skin: no rashes or lesions  Resolved Hospital Problem list   Multifactorial shock  Assessment & Plan:   LUL Cavitary Pneumonia Paraseptal Emphysema (Severe) Underlying OSA Hx post-infectious scarring - no ILD on CT Cavitary PNA on CT chest never treated. ID has low concern for fungal etiology. Afebrile and on BiPAP yesterday for AMS likely CO2 retention. Patient on HFNC 25 L 40% FiO2, this am, but continues to need respiratory support with BiPAP / may need ventilator support. No evidence of DVT, making PE unlikely cause of respiratory support. Patient also febrile to 101 this morning, will evaluate for HAP. -CXR -Sputum culture -Procal -wean HHFNC for sats 88-94% -continue to assess for need of ventilator -pulmonary hygiene -IS, mobilize -brovana, pulmicort + PRN albuterol  -appreciate ID  -augmentin (8/14-9/4) -QHS & PRN daytime sleep bipap   Acute  Metabolic Encephalopathy  Improved with BiPAP, suspect hypercarbic failure +/- hypoactive delirium. CT head neg. Alert and oriented x 3 today (person/place/year), clear speech.  -PT efforts  -supportive care  -frequent reorientation, promote sleep / wake cycle   Multifactorial Shock  Shock resolved, on midodrine. HDS overnight. -midodrine '15mg'$  TID -abx as above   AKI on CKD 2/2 ATN in setting of acute  pancreatitis and shock Cr improved to 7.13 (8.60), K 4.4 (4.1), Phos 5.3 (4.9) this morning. Patient s/p HD yesterday, next session tomorrow, -1 L UF.  Patient nealry - 3 L since admission. 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 RUQ [place] 04/10/22 - CBD dilatation 10.8 mm. Ultrasound abdomen 8/16: Common bile duct measures 7 mm decreased from 10 mm, fatty liver.  GI feels that ultrasound shows normalizing of the CBD, thus may have pass the gallstone. Per GI, not a candidate for MRCP. Triglycerides 407 increased from 358, and continues to rise/fall and remain around 400's. Patient poor candidate for fibrates b/c of renal function, started statin. Tube feeds at goal.  -diet Clear liquid w/ feeds at 60 cc/hr  -PRN tylenol  -atorvastatin   DM II  DKA - resolved Glucose range 150-417. Patient received 138 units of short acting outside of TF coverage yesterday. Patient will need insulin drip. -SSI, resistant scale -insulin gtt -BMP q6h   History of diastolic heart failure Echo 12/25/2019 revealed ejection fraction of 60 to 65%. Repeat echo showed normal EF with grade I diastolic dysfunction. Patient appears euvolemic on exam today.  -tele monitoring   Constipation  Status post methylnaltrexone on 8/14 and smog enema. Patient continues to have loose stools. Miralax PRN.  -bowel regimen  -trickle TF & clear liquid diet  -minimize narcotics as able   Hiccups -gabapentin 100 mg TID PRN  Best Practice (right click and "Reselect all SmartList Selections" daily)  Diet/type: tubefeeds and clear liquids DVT prophylaxis: prophylactic heparin  GI prophylaxis: N/A Lines: Dialysis Catheter and yes and it is  still needed Foley:  N/A Code Status:  full code Last date of multidisciplinary goals of care discussion: Patient updated 8/22. Family updated at bedside 8/19 on plan of care.  Critical care time: 30 minutes   Holley Bouche, MD PGY-2 [facility] 04/29/2022, 7:12 AM   Please see [email or web address] for pager details.   From 7A-7P if no response, please call [phone] After hours, please call ELink [phone]

## 2022-04-30 DIAGNOSIS — E1110 Type 2 diabetes mellitus with ketoacidosis without coma: Secondary | ICD-10-CM | POA: Diagnosis not present

## 2022-04-30 LAB — BASIC METABOLIC PANEL
Anion gap: 16 — ABNORMAL HIGH (ref 5–15)
Anion gap: 16 — ABNORMAL HIGH (ref 5–15)
Anion gap: 16 — ABNORMAL HIGH (ref 5–15)
Anion gap: 17 — ABNORMAL HIGH (ref 5–15)
BUN: 60 mg/dL — ABNORMAL HIGH (ref 8–23)
BUN: 22 mg/dL (ref 8–23)
BUN: 27 mg/dL — ABNORMAL HIGH (ref 8–23)
BUN: 31 mg/dL — ABNORMAL HIGH (ref 8–23)
CO2: 24 mmol/L (ref 22–32)
CO2: 27 mmol/L (ref 22–32)
CO2: 28 mmol/L (ref 22–32)
CO2: 24 mmol/L (ref 22–32)
Calcium: 9.4 mg/dL (ref 8.9–10.3)
Calcium: 9.2 mg/dL (ref 8.9–10.3)
Calcium: 9.6 mg/dL (ref 8.9–10.3)
Calcium: 9.6 mg/dL (ref 8.9–10.3)
Chloride: 91 mmol/L — ABNORMAL LOW (ref 98–111)
Chloride: 91 mmol/L — ABNORMAL LOW (ref 98–111)
Chloride: 91 mmol/L — ABNORMAL LOW (ref 98–111)
Chloride: 91 mmol/L — ABNORMAL LOW (ref 98–111)
Creatinine, Ser: 9.21 mg/dL — ABNORMAL HIGH (ref 0.61–1.24)
Creatinine, Ser: 4.25 mg/dL — ABNORMAL HIGH (ref 0.61–1.24)
Creatinine, Ser: 5.32 mg/dL — ABNORMAL HIGH (ref 0.61–1.24)
Creatinine, Ser: 5.65 mg/dL — ABNORMAL HIGH (ref 0.61–1.24)
GFR, Estimated: 6 mL/min — ABNORMAL LOW (ref >60)
GFR, Estimated: 15 mL/min — ABNORMAL LOW (ref >60)
GFR, Estimated: 11 mL/min — ABNORMAL LOW (ref >60)
GFR, Estimated: 11 mL/min — ABNORMAL LOW (ref >60)
Glucose, Bld: 235 mg/dL — ABNORMAL HIGH (ref 70–99)
Glucose, Bld: 155 mg/dL — ABNORMAL HIGH (ref 70–99)
Glucose, Bld: 202 mg/dL — ABNORMAL HIGH (ref 70–99)
Glucose, Bld: 178 mg/dL — ABNORMAL HIGH (ref 70–99)
Potassium: 4.1 mmol/L (ref 3.5–5.1)
Potassium: 3.4 mmol/L — ABNORMAL LOW (ref 3.5–5.1)
Potassium: 3.8 mmol/L (ref 3.5–5.1)
Potassium: 3.9 mmol/L (ref 3.5–5.1)
Sodium: 131 mmol/L — ABNORMAL LOW (ref 135–145)
Sodium: 134 mmol/L — ABNORMAL LOW (ref 135–145)
Sodium: 135 mmol/L (ref 135–145)
Sodium: 132 mmol/L — ABNORMAL LOW (ref 135–145)

## 2022-04-30 LAB — GLUCOSE, CAPILLARY
Glucose-Capillary: 121 mg/dL — ABNORMAL HIGH (ref 70–99)
Glucose-Capillary: 129 mg/dL — ABNORMAL HIGH (ref 70–99)
Glucose-Capillary: 152 mg/dL — ABNORMAL HIGH (ref 70–99)
Glucose-Capillary: 165 mg/dL — ABNORMAL HIGH (ref 70–99)
Glucose-Capillary: 174 mg/dL — ABNORMAL HIGH (ref 70–99)
Glucose-Capillary: 176 mg/dL — ABNORMAL HIGH (ref 70–99)
Glucose-Capillary: 185 mg/dL — ABNORMAL HIGH (ref 70–99)
Glucose-Capillary: 191 mg/dL — ABNORMAL HIGH (ref 70–99)
Glucose-Capillary: 191 mg/dL — ABNORMAL HIGH (ref 70–99)
Glucose-Capillary: 195 mg/dL — ABNORMAL HIGH (ref 70–99)
Glucose-Capillary: 196 mg/dL — ABNORMAL HIGH (ref 70–99)
Glucose-Capillary: 198 mg/dL — ABNORMAL HIGH (ref 70–99)
Glucose-Capillary: 199 mg/dL — ABNORMAL HIGH (ref 70–99)
Glucose-Capillary: 200 mg/dL — ABNORMAL HIGH (ref 70–99)
Glucose-Capillary: 207 mg/dL — ABNORMAL HIGH (ref 70–99)
Glucose-Capillary: 225 mg/dL — ABNORMAL HIGH (ref 70–99)
Glucose-Capillary: 230 mg/dL — ABNORMAL HIGH (ref 70–99)
Glucose-Capillary: 244 mg/dL — ABNORMAL HIGH (ref 70–99)
Glucose-Capillary: 254 mg/dL — ABNORMAL HIGH (ref 70–99)
Glucose-Capillary: 259 mg/dL — ABNORMAL HIGH (ref 70–99)

## 2022-04-30 LAB — CBC
HCT: 21.8 % — ABNORMAL LOW (ref 39.0–52.0)
Hemoglobin: 6.5 g/dL — CL (ref 13.0–17.0)
MCH: 26.7 pg (ref 26.0–34.0)
MCHC: 29.8 g/dL — ABNORMAL LOW (ref 30.0–36.0)
MCV: 89.7 fL (ref 80.0–100.0)
Platelets: 251 10*3/uL (ref 150–400)
RBC: 2.43 MIL/uL — ABNORMAL LOW (ref 4.22–5.81)
RDW: 15.9 % — ABNORMAL HIGH (ref 11.5–15.5)
WBC: 16.6 10*3/uL — ABNORMAL HIGH (ref 4.0–10.5)
nRBC: 0.2 % (ref 0.0–0.2)

## 2022-04-30 LAB — TRIGLYCERIDES: Triglycerides: 387 mg/dL — ABNORMAL HIGH (ref <150)

## 2022-04-30 LAB — PREPARE RBC (CROSSMATCH)

## 2022-04-30 LAB — MAGNESIUM: Magnesium: 2.4 mg/dL (ref 1.7–2.4)

## 2022-04-30 MED ORDER — ALBUMIN HUMAN 25 % IV SOLN
25.0000 g | Freq: Once | INTRAVENOUS | Status: AC
  Administered 2022-04-30: 25 g via INTRAVENOUS

## 2022-04-30 MED ORDER — INSULIN ASPART 100 UNIT/ML IJ SOLN
1.0000 [IU] | INTRAMUSCULAR | Status: DC
  Administered 2022-05-01: 3 [IU] via SUBCUTANEOUS
  Administered 2022-05-01 (×3): 2 [IU] via SUBCUTANEOUS
  Administered 2022-05-01 – 2022-05-02 (×2): 3 [IU] via SUBCUTANEOUS
  Administered 2022-05-02 (×2): 2 [IU] via SUBCUTANEOUS
  Administered 2022-05-02: 3 [IU] via SUBCUTANEOUS
  Administered 2022-05-02: 2 [IU] via SUBCUTANEOUS
  Administered 2022-05-02 (×2): 3 [IU] via SUBCUTANEOUS
  Administered 2022-05-03: 2 [IU] via SUBCUTANEOUS
  Administered 2022-05-03: 1 [IU] via SUBCUTANEOUS
  Administered 2022-05-03: 2 [IU] via SUBCUTANEOUS
  Administered 2022-05-03 – 2022-05-04 (×2): 1 [IU] via SUBCUTANEOUS
  Administered 2022-05-04: 2 [IU] via SUBCUTANEOUS
  Administered 2022-05-04: 1 [IU] via SUBCUTANEOUS
  Administered 2022-05-04: 2 [IU] via SUBCUTANEOUS
  Administered 2022-05-04 – 2022-05-05 (×6): 1 [IU] via SUBCUTANEOUS
  Administered 2022-05-05: 3 [IU] via SUBCUTANEOUS
  Administered 2022-05-05: 2 [IU] via SUBCUTANEOUS
  Administered 2022-05-06: 1 [IU] via SUBCUTANEOUS
  Administered 2022-05-06: 3 [IU] via SUBCUTANEOUS
  Administered 2022-05-06: 1 [IU] via SUBCUTANEOUS

## 2022-04-30 MED ORDER — INSULIN ASPART 100 UNIT/ML IJ SOLN
13.0000 [IU] | INTRAMUSCULAR | Status: DC
  Administered 2022-05-01 – 2022-05-04 (×12): 13 [IU] via SUBCUTANEOUS
  Administered 2022-05-06: 5 [IU] via SUBCUTANEOUS

## 2022-04-30 MED ORDER — INSULIN DETEMIR 100 UNIT/ML ~~LOC~~ SOLN
38.0000 [IU] | Freq: Two times a day (BID) | SUBCUTANEOUS | Status: DC
  Administered 2022-04-30 – 2022-05-02 (×4): 38 [IU] via SUBCUTANEOUS
  Filled 2022-04-30 (×7): qty 0.38

## 2022-04-30 MED ORDER — DEXTROSE 10 % IV SOLN: INTRAVENOUS | Status: DC | PRN

## 2022-04-30 MED ORDER — ALBUMIN HUMAN 25 % IV SOLN
25.0000 g | Freq: Once | INTRAVENOUS | Status: AC
Start: 2022-04-30 — End: 2022-04-30
  Administered 2022-04-30: 25 g via INTRAVENOUS

## 2022-04-30 MED ORDER — SODIUM CHLORIDE 0.9% IV SOLUTION: Freq: Once | INTRAVENOUS | Status: AC

## 2022-04-30 MED ORDER — HEPARIN SODIUM (PORCINE) 1000 UNIT/ML IJ SOLN
INTRAMUSCULAR | Status: AC
  Administered 2022-04-30: 1000 [IU]
  Filled 2022-04-30: qty 3

## 2022-04-30 NOTE — Progress Notes (Signed)
Inpatient Diabetes Program Recommendations  AACE/ADA: New Consensus Statement on Inpatient Glycemic Control (2015)  Target Ranges:  Prepandial:   less than 140 mg/dL      Peak postprandial:   less than 180 mg/dL (1-2 hours)      Critically ill patients:  140 - 180 mg/dL   Lab Results  Component Value Date   GLUCAP 129 (H) 04/30/2022   HGBA1C 12.5 (H) 04/10/2022    Review of Glycemic Control  Latest Reference Range & Units 04/30/22 07:30 04/30/22 08:31 04/30/22 09:59 04/30/22 11:06 04/30/22 12:37  Glucose-Capillary 70 - 99 mg/dL 195 (H) 185 (H) 152 (H) 121 (H) 129 (H)  Diabetes history: DM2 Outpatient Diabetes medications: Jardiance 10 mg daily Current orders for Inpatient glycemic control:  IV insulin - goal 140-180 mg/dL Vital 60 ml/hr  Inpatient Diabetes Program Recommendations:    Note blood glucose much better controlled with IV insulin however patient requiring high insulin rates (13-30 units/hr).  ? If patient needs lower carb. Tube feeding.   Discussed with resident and pharmacist.  Would not recommend transition off insulin drip until drip rates less than 8 units/hr (per phase 3 ICU order set).   Will follow.   Thanks, Adah Perl, RN, BC-ADM Inpatient Diabetes Coordinator Pager [phone]  (8a-5p)

## 2022-04-30 NOTE — Progress Notes (Signed)
Physical Therapy Treatment Patient Details Name: Mathew Lara MRN: [id number] DOB: September 05, 1961 Today's Date: 04/30/2022   History of Present Illness Pt is 61 yo male presenting to [facility] ED on 8/5 with abdominal pain. ED work-up revealed DKA, acute pancreatitis, AKI. CT 8/5 compatible with acute pancreatitis. Overnight 8/5 developed continuing respiratory failure and sock. Transfer to [facility]. Placed on BiPAP. Started on CRRT 04/13/22 - 04/25/22. Repeat CT abd pelvis 8/14 with edematous pancreas but no evidence of necrotizing pancreatitis. PMH including cholecystectomy (March 23, 2021), alcohol abuse, anxiety, CKD, COPD, diabetes mellitus type 2, essential hypertension, hepatitis C, mixed hyperlipidemia.    PT Comments    Pt slowly progressing toward goals.  Emphasis on transition to EOB, sit to stand at EOB while completing pericare sitting balance/stamina and transfer via squat pivot with face to face assist to the recliner.    Recommendations for follow up therapy are one component of a multi-disciplinary discharge planning process, led by the attending physician.  Recommendations may be updated based on patient status, additional functional criteria and insurance authorization.  Follow Up Recommendations  Skilled nursing-short term rehab (<3 hours/day) Can patient physically be transported by private vehicle: No   Assistance Recommended at Discharge Frequent or constant Supervision/Assistance  Patient can return home with the following Assist for transportation;Assistance with cooking/housework;Help with stairs or ramp for entrance;Two people to help with walking and/or transfers;Two people to help with bathing/dressing/bathroom   Equipment Recommendations  Other (comment)    Recommendations for Other Services       Precautions / Restrictions Precautions Precautions: Fall Precaution Comments: NG tube, flexseal,     Mobility  Bed Mobility Overal bed mobility: Needs  Assistance Bed Mobility: Supine to Sit     Supine to sit: Mod assist, +2 for physical assistance, HOB elevated     General bed mobility comments: Mod A to elevate trunk and bring hips to EOB. Increased engagement to bring LE'S to EOB and then attmepting to elevate trunk    Transfers Overall transfer level: Needs assistance Equipment used: 2 person hand held assist Transfers: Sit to/from Stand, Bed to chair/wheelchair/BSC Sit to Stand: Max assist, +2 physical assistance Stand pivot transfers: Max assist, +2 physical assistance         General transfer comment: Max A +2 for power up and weight shift forward. Max a +2 for managing hips and maintaining balance during pivot to recliner    Ambulation/Gait                   Stairs             Wheelchair Mobility    Modified Rankin (Stroke Patients Only)       Balance Overall balance assessment: Needs assistance Sitting-balance support: Single extremity supported, Feet supported Sitting balance-Leahy Scale: Fair     Standing balance support: Bilateral upper extremity supported, During functional activity Standing balance-Leahy Scale: Poor                              Cognition Arousal/Alertness: Lethargic Behavior During Therapy: Flat affect Overall Cognitive Status: Impaired/Different from baseline                                 General Comments: Continues to be soft spoken and minimal verbalization. Following simple commands and answering questions with increased time.  Exercises      General Comments General comments (skin integrity, edema, etc.): BP supine 89/51 (63), sitting 92/53 (58), sitting after stand 74/61 (66), and sitting in recliner after stand pivot 83/38 (47).      Pertinent Vitals/Pain Pain Assessment Pain Assessment: Faces Faces Pain Scale: Hurts little more Pain Location: Back Pain Descriptors / Indicators: Discomfort, Grimacing Pain  Intervention(s): Limited activity within patient's tolerance    Home Living                          Prior Function            PT Goals (current goals can now be found in the care plan section) Acute Rehab PT Goals PT Goal Formulation: With patient Time For Goal Achievement: 05/11/22 Potential to Achieve Goals: Fair Progress towards PT goals: Progressing toward goals    Frequency    Min 2X/week      PT Plan Current plan remains appropriate    Co-evaluation PT/OT/SLP Co-Evaluation/Treatment: Yes Reason for Co-Treatment: For patient/therapist safety PT goals addressed during session: Mobility/safety with mobility OT goals addressed during session: ADL's and self-care      AM-PAC PT "6 Clicks" Mobility   Outcome Measure  Help needed turning from your back to your side while in a flat bed without using bedrails?: A Lot Help needed moving from lying on your back to sitting on the side of a flat bed without using bedrails?: A Lot Help needed moving to and from a bed to a chair (including a wheelchair)?: Total Help needed standing up from a chair using your arms (e.g., wheelchair or bedside chair)?: Total Help needed to walk in hospital room?: Total Help needed climbing 3-5 steps with a railing? : Total 6 Click Score: 8    End of Session Equipment Utilized During Treatment: Oxygen Activity Tolerance: Patient tolerated treatment well;Patient limited by lethargy Patient left: in chair;with call bell/phone within reach;with chair alarm set (sitting on a lift pad) Nurse Communication: Mobility status PT Visit Diagnosis: Other abnormalities of gait and mobility (R26.89);Muscle weakness (generalized) (M62.81)     Time: 5277-8242 PT Time Calculation (min) (ACUTE ONLY): 27 min  Charges:  $Therapeutic Activity: 8-22 mins                     04/30/2022  Ginger Carne., PT Acute Rehabilitation Services [phone]  (pager) [phone]  (office)   Tessie Fass  Zenobia Kuennen 04/30/2022, 6:23 PM

## 2022-04-30 NOTE — Progress Notes (Signed)
NAME:  Mathew Lara, MRN:  [id number], DOB:  [birth year], LOS: 36 ADMISSION DATE:  04/10/2022, CONSULTATION DATE:  8/5 REFERRING MD:  Darrell Jewel, CHIEF COMPLAINT: Abdominal pain  History of Present Illness:  Mathew Lara is an 61 y.o. M who presented to [facility] on 8/5 with a chief complaint of abdominal pain.  They have a past medical history of alcohol abuse, anxiety, CKD 3, COPD, OSA, type 2 diabetes, hypertension, hyperlipidemia, hep C+  ED work-up revealed DKA, acute pancreatitis, AKI.  He was admitted to the hospital service.  Overnight 8/5 he developed continuing respiratory distress, shock.   PCCM was consulted for transfer to [facility] for further management.   Pertinent  Medical History  Alcohol abuse, anxiety, CKD 3, COPD, OSA, type 2 diabetes, hypertension, hyperlipidemia, hep C  Significant Hospital Events: Including procedures, antibiotic start and stop dates in addition to other pertinent events   8/5 Presented to [facility] ED, transferred [facility] 8/7 On pressors  8/8 BiPAP, worsening renal parameters 8/9 Tolerating CRRT, on BiPAP 8/10 has been off BiPAP, on high flow oxygen and tolerating 8/11 UOP improving. CRRT with net even UF 8/14 CT reveals worsening cavitary pneumonia and acute pancreatitis, large BM 8/15 abx changed for cavitary pneumonia 8/16 ID consult.  ID stopped vancomycin, Zosyn, azithromycin >narrowed to augmentin. CRRT stopped.   8/17 CRRT restarted.  Off pressors 8/19 Remains off pressors, CRRT ongoing. HD cath required tPA.  8/21 iHD, off CRRT  Interim History / Subjective:  Afebrile overnight, continues to have loose stools.   Objective   Blood pressure 107/65, pulse (!) 58, temperature 99 F (37.2 C), temperature source Oral, resp. rate (!) 24, height '5\' 8"'$  (1.727 m), weight 123.2 kg, SpO2 97 %.    Vent Mode: PCV;BIPAP FiO2 (%):  [40 %-50 %] 45 % Set Rate:  [16 bmp] 16 bmp PEEP:  [5 cmH20] 5 cmH20   Intake/Output  Summary (Last 24 hours) at 04/30/2022 1962 Last data filed at 04/30/2022 0600 Gross per 24 hour  Intake 2123.3 ml  Output 900 ml  Net 1223.3 ml    Filed Weights   04/28/22 1248 04/29/22 0500 04/30/22 0500  Weight: 121 kg 121.7 kg 123.2 kg   Physical Exam: General: adult male lying in bed in NAD HEENT: MM pink/moist, R IJ HD cath, anicteric  Neuro: AAOx3 (person, place, year), speech clear  CV: s1s2 RRR, NRMG PULM: non-labored at rest, CTABL GI: soft, normal BS, cortrak in place   Extremities: warm/dry, no edema, cap refill < 2 sec Skin: no rashes or lesions  Resolved Hospital Problem list   Multifactorial shock  Assessment & Plan:   LUL Cavitary Pneumonia Paraseptal Emphysema (Severe) Underlying OSA Hx post-infectious scarring - no ILD on CT Cavitary PNA on CT chest never treated. ID has low concern for fungal etiology. Afebrile, patient on HFNC 25 L 40% FiO2, this am, but continues to need respiratory support with BiPAP / may need ventilator support. Patient afebrile overnight, CXR showed no new developing pneumonia, procal elevated but down nearly half as high as two weeks ago, WBC trending down. Sputum cultures sent. At this time respiratory issues likely 2/2 emphysematous lungs with poor respiratory reserve.  -F/u Sputum culture -wean HHFNC for sats 88-94% -continue to assess for need of ventilator -pulmonary hygiene -IS, mobilize -brovana, pulmicort + PRN albuterol  -appreciate ID  -augmentin (8/14-9/4) -QHS & PRN daytime sleep bipap  ABL Anemia Patient Hgb drop to 6.5 today, from  8.3 on 8/21. Patient with no signs and symptoms of bleeding. -1 unit PRBC -Post Tranfusion H/H -AM CBC -continue to monitor   Acute Metabolic Encephalopathy  Improved with BiPAP, suspect hypercarbic failure +/- hypoactive delirium. CT head neg. Alert and oriented x 3 today  (person/place/year), clear speech.  -PT efforts  -supportive care  -frequent reorientation, promote sleep / wake  cycle   Multifactorial Shock  Shock resolved, on midodrine. HDS overnight. Continues to be on levophed.  -wean levophed as tolerated -midodrine 20 mg TID -abx as above   AKI on CKD 2/2 ATN in setting of acute pancreatitis and shock Cr worsened to 9.21 (9.08), K normal. Patient to receive dialysis today.  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 RUQ [place] 04/10/22 - CBD dilatation 10.8 mm. Ultrasound abdomen 8/16: Common bile duct measures 7 mm decreased from 10 mm, fatty liver.  GI feels that ultrasound shows normalizing of the CBD, thus may have pass the gallstone. Per GI, not a candidate for MRCP. Triglycerides 387 from 407, continues to rise/fall around 400's range. Patient poor candidate for fibrates b/c of renal function, thus on statin. Tube feeds at goal and tolerated well.  -diet Clear liquid w/ feeds at 60 cc/hr  -PRN tylenol  -atorvastatin   DM II  DKA - resolved Glucose range 180's-250's overnight. Patient on insulin drip.  -insulin gtt, transition off when sugars well controlled (CBG 140-180) -BMP q6h   History of diastolic heart failure Echo 12/25/2019 revealed ejection fraction of 60 to 65%. Repeat echo showed normal EF with grade I diastolic dysfunction. Patient appears euvolemic on exam today.  -tele monitoring   Constipation - resolved Diarrhea Status post methylnaltrexone on 8/14 and smog enema. Patient continues to have loose stools.   -bowel regimen  -trickle TF & clear liquid diet  -minimize narcotics as able   Hiccups -gabapentin 100 mg TID PRN  Best Practice (right click and "Reselect all SmartList Selections" daily)  Diet/type: tubefeeds and clear liquids DVT  prophylaxis: prophylactic heparin  GI prophylaxis: N/A Lines: Dialysis Catheter and yes and it is still needed Foley:  N/A Code Status:  full code Last date of multidisciplinary goals of care discussion: Patient updated 8/22. Family updated at bedside 8/19 on plan of care.  Critical care time: 30 minutes   Holley Bouche, MD PGY-2 [facility] 04/30/2022, 7:07 AM   Please see [email or web address] for pager details.   From 7A-7P if no response, please call [phone] After hours, please call ELink [phone]

## 2022-04-30 NOTE — TOC Progression Note (Addendum)
Transition of Care [facility]) - Progression Note    Patient Details  Name: Mathew Lara MRN: [id number] Date of Birth: [birth year]  Transition of Care [facility]) CM/SW Contact  Tom-Johnson, Renea Ee, RN Phone Number: 04/30/2022, 4:43 PM  Clinical Narrative:     CM called and spoke with patient's daughter, [name] about discharge disposition. [name] states she has had the conversation about LTACH with patient's MD. CM went over the list of LTACH facilities from Medicare.gov with [name] and and she chose [facility]. Referral called in to [facility] and she will call daughter and submit for insurance authorization. CM will continue to follow with needs as patient progresses with care.     Barriers to Discharge: Continued Medical Work up  Expected Discharge Plan and Services                                                 Social Determinants of Health (SDOH) Interventions    Readmission Risk Interventions     No data to display

## 2022-04-30 NOTE — Progress Notes (Signed)
Hd completed, pt remain alert and oriented throughout treatment. Moments of dropo in pressures unable to reach goal, 1 unit given but unable to complete due to failure feature on hd machine, no other problems to mote 1.5L removed on a 4 hour 4 minute treatment, BVP 97.5L

## 2022-04-30 NOTE — Progress Notes (Signed)
Spoke with Marjory Lies RN and he is aware of the DC central line order

## 2022-04-30 NOTE — Procedures (Signed)
I was present at this dialysis session. I have reviewed the session itself and made appropriate changes.   Goal UF 2L on 2K bath.  BPs marignal getting IVB albumin and on low dose NE  WBC 22 to 16 today. Hb 6.5, transfuse per CCM.    Plan to remoe temp HD cath post HD today, line holiday, hopeful TDC thereafter.   Filed Weights   04/29/22 0500 04/30/22 0500 04/30/22 0748  Weight: 121.7 kg 123.2 kg 123.2 kg    Recent Labs  Lab 04/29/22 0332 04/29/22 1515 04/30/22 0317  NA 128*   < > 131*  K 4.4   < > 4.1  CL 89*   < > 91*  CO2 24   < > 24  GLUCOSE 417*   < > 235*  BUN 42*   < > 60*  CREATININE 7.13*   < > 9.21*  CALCIUM 9.0   < > 9.4  PHOS 5.3*  --   --    < > = values in this interval not displayed.    Recent Labs  Lab 04/25/22 0255 04/25/22 0534 04/26/22 0356 04/30/22 0840  WBC 21.6*  --  22.2* 16.6*  HGB 9.0* 8.5* 8.3* 6.5*  HCT 29.6* 25.0* 27.6* 21.8*  MCV 88.4  --  89.0 89.7  PLT 322  --  313 251    Scheduled Meds:  sodium chloride   Intravenous Once   amoxicillin-clavulanate  500 mg Per Tube Q12H   arformoterol  15 mcg Nebulization BID   atorvastatin  80 mg Per Tube Daily   budesonide (PULMICORT) nebulizer solution  0.5 mg Nebulization BID   Chlorhexidine Gluconate Cloth  6 each Topical Daily   famotidine  20 mg Per Tube Daily   feeding supplement  237 mL Per Tube TID BM   feeding supplement (PROSource TF20)  60 mL Per Tube BID   heparin  5,000 Units Subcutaneous Q8H   midodrine  20 mg Per Tube Q8H   multivitamin  1 tablet Per Tube QHS   mouth rinse  15 mL Mouth Rinse 4 times per day   simethicone  80 mg Per Tube QID   sodium chloride flush  10-40 mL Intracatheter Q12H   Continuous Infusions:  sodium chloride Stopped (04/21/22 1243)   feeding supplement (VITAL 1.5 CAL) 60 mL/hr at 04/30/22 0700   insulin 23 Units/hr (04/30/22 0700)   norepinephrine (LEVOPHED) Adult infusion Stopped (04/30/22 8768)   PRN Meds:.sodium chloride, acetaminophen,  albuterol, calcium carbonate, dextrose, gabapentin, ondansetron (ZOFRAN) IV, mouth rinse, oxyCODONE, polyethylene glycol, sodium chloride flush, white petrolatum   Pearson Grippe  MD 04/30/2022, 9:13 AM

## 2022-04-30 NOTE — Progress Notes (Signed)
Pt requested a break from bipap, and placed back on HHFNC. Increased flow to 30L due to an increased WOB. Pt states that is is more comfortable for the time being.

## 2022-04-30 NOTE — Progress Notes (Signed)
An USGPIV (ultrasound guided PIV) has been placed for short-term vasopressor infusion. A correctly placed ivWatch must be used when administering Vasopressors. Should this treatment be needed beyond 72 hours, central line access should be obtained.  It will be the responsibility of the bedside nurse to follow best practice to prevent extravasations.   ?

## 2022-04-30 NOTE — Progress Notes (Signed)
[facility] Progress Note Patient Name: Mathew Lara DOB: [birth year] MRN: [id number]   Date of Service  04/30/2022  HPI/Events of Note  Endotool flagging patient for transition to SQ insulin.  eICU Interventions  Patient transitioned to SQ Insulin utilizing sensitive scale (GFR 11).        [name] 04/30/2022, 10:23 PM

## 2022-04-30 NOTE — Progress Notes (Signed)
Occupational Therapy Treatment Patient Details Name: Mathew Lara MRN: [id number] DOB: 1961/06/21 Today's Date: 04/30/2022   History of present illness Pt is 61 yo male presenting to [facility] ED on 8/5 with abdominal pain. ED work-up revealed DKA, acute pancreatitis, AKI. CT 8/5 compatible with acute pancreatitis. Overnight 8/5 developed continuing respiratory failure and sock. Transfer to [facility]. Placed on BiPAP. Started on CRRT 04/13/22 - 04/25/22. Repeat CT abd pelvis 8/14 with edematous pancreas but no evidence of necrotizing pancreatitis. PMH including cholecystectomy (March 23, 2021), alcohol abuse, anxiety, CKD, COPD, diabetes mellitus type 2, essential hypertension, hepatitis C, mixed hyperlipidemia.   OT comments  Pt progressing towards established OT goals and demonstrating increased arousal and activity tolerance this session. Pt performing bed mobility with Mod A +2 and tolerating sitting at EOB with Min Guard A. Pt performing sit<>stand x2 and then transfer to recliner with Max A +2. BP soft throughout; RN aware. Continue to recommend dc to SNF and will continue to follow acutely as admitted.   BP supine 89/51 (63), sitting 92/53 (58), sitting after stand 74/61 (66), and sitting in recliner after stand pivot 83/38 (47).   Recommendations for follow up therapy are one component of a multi-disciplinary discharge planning process, led by the attending physician.  Recommendations may be updated based on patient status, additional functional criteria and insurance authorization.    Follow Up Recommendations  Skilled nursing-short term rehab (<3 hours/day)    Assistance Recommended at Discharge Frequent or constant Supervision/Assistance  Patient can return home with the following  Two people to help with walking and/or transfers;Two people to help with bathing/dressing/bathroom   Equipment Recommendations  Other (comment) (Defer to next venue)    Recommendations for Other  Services PT consult    Precautions / Restrictions Precautions Precautions: Fall Precaution Comments: NG tube, flexseal,       Mobility Bed Mobility Overal bed mobility: Needs Assistance Bed Mobility: Supine to Sit     Supine to sit: Mod assist, +2 for physical assistance, HOB elevated     General bed mobility comments: Mod A to elevate trunk and bring hisp to EOB. Increased engagement to bring LES to EOB and then attmepting to elevate trunk    Transfers Overall transfer level: Needs assistance Equipment used: 2 person hand held assist Transfers: Sit to/from Stand, Bed to chair/wheelchair/BSC Sit to Stand: Max assist, +2 physical assistance Stand pivot transfers: Max assist, +2 physical assistance         General transfer comment: Max A +2 for power up and weight shift forward. Max a +2 for managing hips and maintaining balance during pivot to recliner     Balance Overall balance assessment: Needs assistance Sitting-balance support: Single extremity supported, Feet supported Sitting balance-Leahy Scale: Fair     Standing balance support: Bilateral upper extremity supported, During functional activity Standing balance-Leahy Scale: Poor                             ADL either performed or assessed with clinical judgement   ADL Overall ADL's : Needs assistance/impaired                         Toilet Transfer: Maximal assistance;+2 for physical assistance;Stand-pivot (simulated to recliner)   Toileting- Clothing Manipulation and Hygiene: Total assistance Toileting - Clothing Manipulation Details (indicate cue type and reason): Total A for peri care     Functional mobility  during ADLs: Maximal assistance;+2 for physical assistance General ADL Comments: Pt performing sit<>stand with Max A +2 from EOB. Pt then agreeable to transfer to recliner. Max A +2 for stand pivot to recliner. BP continues to be soft    Extremity/Trunk Assessment Upper  Extremity Assessment Upper Extremity Assessment: Generalized weakness   Lower Extremity Assessment Lower Extremity Assessment: Generalized weakness        Vision       Perception     Praxis      Cognition Arousal/Alertness: Lethargic Behavior During Therapy: Flat affect Overall Cognitive Status: Impaired/Different from baseline                                 General Comments: Continues to be soft spoken and minimal verbalization. Following simple commands and answering questions with increased time.        Exercises      Shoulder Instructions       General Comments BP supine 89/51 (63), sitting 92/53 (58), sitting after stand 74/61 (66), and sitting in recliner after stand pivot 83/38 (47).    Pertinent Vitals/ Pain       Pain Assessment Pain Assessment: Faces Faces Pain Scale: Hurts little more Pain Location: Back Pain Descriptors / Indicators: Discomfort, Grimacing Pain Intervention(s): Limited activity within patient's tolerance, Monitored during session, Repositioned  Home Living                                          Prior Functioning/Environment              Frequency  Min 2X/week        Progress Toward Goals  OT Goals(current goals can now be found in the care plan section)  Progress towards OT goals: Progressing toward goals  Acute Rehab OT Goals OT Goal Formulation: With patient Time For Goal Achievement: 05/11/22 Potential to Achieve Goals: Good ADL Goals Pt Will Perform Grooming: with min guard assist;sitting Pt Will Perform Upper Body Dressing: with min assist;sitting Pt Will Transfer to Toilet: stand pivot transfer;bedside commode;with mod assist Additional ADL Goal #1: Pt will perform bed mobility with Min A +2 in preparation for ADLs Additional ADL Goal #2: Pt will follow one step commands 75% of time during ADLs with Min cues  Plan      Co-evaluation    PT/OT/SLP Co-Evaluation/Treatment:  Yes Reason for Co-Treatment: For patient/therapist safety;To address functional/ADL transfers PT goals addressed during session: Mobility/safety with mobility;Strengthening/ROM OT goals addressed during session: ADL's and self-care      AM-PAC OT "6 Clicks" Daily Activity     Outcome Measure   Help from another person eating meals?: A Little Help from another person taking care of personal grooming?: A Little Help from another person toileting, which includes using toliet, bedpan, or urinal?: A Lot Help from another person bathing (including washing, rinsing, drying)?: A Lot Help from another person to put on and taking off regular upper body clothing?: A Lot Help from another person to put on and taking off regular lower body clothing?: A Lot 6 Click Score: 14    End of Session Equipment Utilized During Treatment: Oxygen  OT Visit Diagnosis: Unsteadiness on feet (R26.81);Other abnormalities of gait and mobility (R26.89);Muscle weakness (generalized) (M62.81);Pain   Activity Tolerance Patient tolerated treatment well;Patient limited by fatigue   Patient  Left in chair;with call bell/phone within reach   Nurse Communication Mobility status        Time: 5001-6429 OT Time Calculation (min): 27 min  Charges: OT General Charges $OT Visit: 1 Visit OT Treatments $Self Care/Home Management : 8-22 mins  Smiley Birr MSOT, OTR/L Acute Rehab Office: [place] 04/30/2022, 4:52 PM

## 2022-05-01 ENCOUNTER — Inpatient Hospital Stay (HOSPITAL_COMMUNITY): Payer: Medicare Other

## 2022-05-01 DIAGNOSIS — E1110 Type 2 diabetes mellitus with ketoacidosis without coma: Secondary | ICD-10-CM | POA: Diagnosis not present

## 2022-05-01 LAB — BASIC METABOLIC PANEL
Anion gap: 17 — ABNORMAL HIGH (ref 5–15)
Anion gap: 18 — ABNORMAL HIGH (ref 5–15)
BUN: 41 mg/dL — ABNORMAL HIGH (ref 8–23)
BUN: 40 mg/dL — ABNORMAL HIGH (ref 8–23)
CO2: 24 mmol/L (ref 22–32)
CO2: 24 mmol/L (ref 22–32)
Calcium: 9.7 mg/dL (ref 8.9–10.3)
Calcium: 9.7 mg/dL (ref 8.9–10.3)
Chloride: 91 mmol/L — ABNORMAL LOW (ref 98–111)
Chloride: 91 mmol/L — ABNORMAL LOW (ref 98–111)
Creatinine, Ser: 6.77 mg/dL — ABNORMAL HIGH (ref 0.61–1.24)
Creatinine, Ser: 7.24 mg/dL — ABNORMAL HIGH (ref 0.61–1.24)
GFR, Estimated: 9 mL/min — ABNORMAL LOW (ref >60)
GFR, Estimated: 8 mL/min — ABNORMAL LOW (ref >60)
Glucose, Bld: 189 mg/dL — ABNORMAL HIGH (ref 70–99)
Glucose, Bld: 223 mg/dL — ABNORMAL HIGH (ref 70–99)
Potassium: 4 mmol/L (ref 3.5–5.1)
Potassium: 4 mmol/L (ref 3.5–5.1)
Sodium: 132 mmol/L — ABNORMAL LOW (ref 135–145)
Sodium: 133 mmol/L — ABNORMAL LOW (ref 135–145)

## 2022-05-01 LAB — CBC
HCT: 24.2 % — ABNORMAL LOW (ref 39.0–52.0)
Hemoglobin: 7.7 g/dL — ABNORMAL LOW (ref 13.0–17.0)
MCH: 26.8 pg (ref 26.0–34.0)
MCHC: 31.8 g/dL (ref 30.0–36.0)
MCV: 84.3 fL (ref 80.0–100.0)
Platelets: 230 10*3/uL (ref 150–400)
RBC: 2.87 MIL/uL — ABNORMAL LOW (ref 4.22–5.81)
RDW: 17.3 % — ABNORMAL HIGH (ref 11.5–15.5)
WBC: 15.4 10*3/uL — ABNORMAL HIGH (ref 4.0–10.5)
nRBC: 0.5 % — ABNORMAL HIGH (ref 0.0–0.2)

## 2022-05-01 LAB — IRON AND TIBC
Iron: 35 ug/dL — ABNORMAL LOW (ref 45–182)
Saturation Ratios: 12 % — ABNORMAL LOW (ref 17.9–39.5)
TIBC: 284 ug/dL (ref 250–450)
UIBC: 249 ug/dL

## 2022-05-01 LAB — FERRITIN: Ferritin: 1149 ng/mL — ABNORMAL HIGH (ref 24–336)

## 2022-05-01 LAB — TYPE AND SCREEN
ABO/RH(D): O POS
Antibody Screen: NEGATIVE
Unit division: 0

## 2022-05-01 LAB — BPAM RBC
Blood Product Expiration Date: 202309262359
ISSUE DATE / TIME: 202308251145
Unit Type and Rh: 5100

## 2022-05-01 LAB — MAGNESIUM: Magnesium: 1.9 mg/dL (ref 1.7–2.4)

## 2022-05-01 LAB — GLUCOSE, CAPILLARY
Glucose-Capillary: 173 mg/dL — ABNORMAL HIGH (ref 70–99)
Glucose-Capillary: 173 mg/dL — ABNORMAL HIGH (ref 70–99)
Glucose-Capillary: 175 mg/dL — ABNORMAL HIGH (ref 70–99)
Glucose-Capillary: 196 mg/dL — ABNORMAL HIGH (ref 70–99)
Glucose-Capillary: 199 mg/dL — ABNORMAL HIGH (ref 70–99)
Glucose-Capillary: 216 mg/dL — ABNORMAL HIGH (ref 70–99)
Glucose-Capillary: 227 mg/dL — ABNORMAL HIGH (ref 70–99)
Glucose-Capillary: 232 mg/dL — ABNORMAL HIGH (ref 70–99)

## 2022-05-01 MED ORDER — MIDODRINE HCL 5 MG PO TABS
30.0000 mg | ORAL_TABLET | Freq: Three times a day (TID) | ORAL | Status: DC
Start: 2022-05-01 — End: 2022-05-02
  Administered 2022-05-01 – 2022-05-02 (×3): 30 mg
  Filled 2022-05-01 (×4): qty 6

## 2022-05-01 MED ORDER — PANTOPRAZOLE 2 MG/ML SUSPENSION
40.0000 mg | Freq: Two times a day (BID) | ORAL | Status: DC
Start: 2022-05-01 — End: 2022-05-07
  Administered 2022-05-01 – 2022-05-06 (×11): 40 mg
  Filled 2022-05-01 (×14): qty 20

## 2022-05-01 MED ORDER — MIDODRINE HCL 5 MG PO TABS
10.0000 mg | ORAL_TABLET | Freq: Once | ORAL | Status: AC
Start: 2022-05-01 — End: 2022-05-01
  Administered 2022-05-01: 10 mg
  Filled 2022-05-01: qty 2

## 2022-05-01 NOTE — Progress Notes (Signed)
Pt placed on bipap per Pt request while sleeping. Pt seems comfortable and is resting at this time.

## 2022-05-01 NOTE — Progress Notes (Addendum)
NAME:  Mathew Lara, MRN:  [id number], DOB:  December 04, 1960, LOS: 21 ADMISSION DATE:  04/10/2022, CONSULTATION DATE:  8/5 REFERRING MD:  Darrell Jewel, CHIEF COMPLAINT: Abdominal pain  History of Present Illness:  Mathew Lara is an 61 y.o. M who presented to the [facility] on 8/5 with a chief complaint of abdominal pain.  They have a past medical history of alcohol abuse, anxiety, CKD 3, COPD, OSA, type 2 diabetes, hypertension, hyperlipidemia, hep C+  ED work-up revealed DKA, acute pancreatitis, AKI.  He was admitted to the hospital service.  Overnight 8/5 he developed continuing respiratory distress, shock.   PCCM was consulted for transfer to [facility] for further management.   Pertinent  Medical History  Alcohol abuse, anxiety, CKD 3, COPD, OSA, type 2 diabetes, hypertension, hyperlipidemia, hep C  Significant Hospital Events: Including procedures, antibiotic start and stop dates in addition to other pertinent events   8/5 Presented to [facility] ED, transferred [facility] 8/7 On pressors  8/8 BiPAP, worsening renal parameters 8/9 Tolerating CRRT, on BiPAP 8/10 has been off BiPAP, on high flow oxygen and tolerating 8/11 UOP improving. CRRT with net even UF 8/14 CT reveals worsening cavitary pneumonia and acute pancreatitis, large BM 8/15 abx changed for cavitary pneumonia 8/16 ID consult.  ID stopped vancomycin, Zosyn, azithromycin >narrowed to augmentin. CRRT stopped.   8/17 CRRT restarted.  Off pressors 8/19 Remains off pressors, CRRT ongoing. HD cath required tPA.  8/21 iHD, off CRRT  Interim History / Subjective:  On pressors. No acute complaints.  Objective   Blood pressure (!) 90/54, pulse (!) 117, temperature 98.5 F (36.9 C), temperature source Oral, resp. rate (!) 25, height '5\' 8"'$  (1.727 m), weight 120 kg, SpO2 96 %.    Vent Mode: BIPAP;PCV FiO2 (%):  [40 %-45 %] 40 % Set Rate:  [16 bmp] 16 bmp PEEP:  [5 cmH20] 5 cmH20   Intake/Output Summary (Last 24  hours) at 05/01/2022 1022 Last data filed at 05/01/2022 1000 Gross per 24 hour  Intake 2895.1 ml  Output 1500 ml  Net 1395.1 ml    Filed Weights   04/30/22 0500 04/30/22 0748 05/01/22 0500  Weight: 123.2 kg 123.2 kg 120 kg   Physical Exam: No distress Breath sounds diminished due to shallow inspiratory effort Abd soft, +BS Heart sounds tachycardic, ext warm No edema Moves all 4 ext but weak  BMP ok CBC pending  Resolved Hospital Problem list     Assessment & Plan:  Persistent shock state- lingering vasoplegia in setting of chronic anemia, new renal failure, prolonged critical illness.  Echo okay. Cavitary PNA vs. Infected bullae Acute on chronic anemia- no s/s of bleeding Emphysematous lung disease- DLCO 38% pred 02/12/20 Acute hypoxemic/hypercarbic respiratory failure- still needing BIPAP qHS and PRN during day Some combination gallstone vs. Hyper-TG pancreatitis- improved Acute on chronic renal failure- now HD dependent Severe muscular deconditioning- not POA, acquired as part of prolonged critical illnesss DM2 with hyperglycemia Hx HTN, HLD, OSA, HepC  - Increase midodrine - Levophed to MAP 65 - Check iron studies - Appreciate PT/OT efforts, keep in chair most of day - BIPAP qHS and PRN, continue nebs as ordered - Insulin basal bolus - Basal bolus insulin titrated to glucose 100-180 - Augmentin to complete 3 weeks treatment  Best Practice (right click and "Reselect all SmartList Selections" daily)  Diet/type: tubefeeds and clear liquids DVT prophylaxis: prophylactic heparin  GI prophylaxis: N/A Lines: Dialysis Catheter and yes and it is  still needed Foley:  N/A Code Status:  full code Last date of multidisciplinary goals of care discussion: full scope  34 min cc time Erskine Emery MD PCCM 05/01/2022, 10:22 AM Please see [email or web address] for pager details.   From 7A-7P if no response, please call [phone] After hours, please call ELink [phone]

## 2022-05-01 NOTE — Progress Notes (Signed)
[facility] Progress Note   Assessment/ Plan:   Anuric dialysis dependent AKI/CKD stage IIIb-IV - presumably ischemic ATN in setting of acute pancreatitis, hypotension, pressor support, with concomitant ARB, SGLT-2 inhibitor and finerenone.  Initially non-oliguric but now UOP has stopped Continue to hold losartan, Jardiance, and [place].   Started on CRRT 04/13/22 - 04/25/22.   RIJ temp HD cath placed 04/13/22 removed 04/30/22 Currently w/o HD catheter   Start iHD 8/21, tolerating on MWF schedule HD Rx: midodrine and bolus albumin: 2K, 1-2L UF,  Follow clinically over weekend, IR following for [facility] maybe on 8/28  Acute pancreatitis: resolved  Shock -on off pressors per PCCM; 20 TID Midodrin Acute hypoxic respiratory failure - with underlying COPD and OSA.  Per PCCM Hep C with advanced liver fibrosis Chronic diastolic CHF - stable. Anemia; transfuse per CCM DM2  Subjective:    HD yesterday, 1.5L UF Temp HD cath removed afterwards for line holiday Req low dose NE on/off No sig UOP    Objective:   BP (!) 87/52   Pulse (!) 108   Temp 98.5 F (36.9 C) (Oral)   Resp (!) 25   Ht '5\' 8"'$  (1.727 m)   Wt 120 kg   SpO2 92%   BMI 40.22 kg/m   Physical Exam: GGY:IRSWN, tachycardic CVS: tachycardic Resp: CTAB on HFNC Abd: soft, obese,  Ext: no LE edema ACCESS: none  Labs: BMET Recent Labs  Lab 04/26/22 0356 04/26/22 1816 04/27/22 0516 04/27/22 1536 04/28/22 0338 04/28/22 1550 04/29/22 0332 04/29/22 1515 04/29/22 2141 04/30/22 0317 04/30/22 1255 04/30/22 1725 04/30/22 2114 05/01/22 0600  NA 133* 132* 131* 134* 133* 131* 128* 133* 133* 131* 134* 135 132* 132*  K 5.3* 3.9 4.4 4.5 4.7 4.1 4.4 4.3 4.3 4.1 3.4* 3.8 3.9 4.0  CL 97* 93* 94* 95* 95* 91* 89* 93* 91* 91* 91* 91* 91* 91*  CO2 20* '26 26 25 23 24 24 26 24 24 27 28 24 24  '$ GLUCOSE 179* 173* 197* 257* 290* 305* 417* 254* 222* 235* 155* 202* 178* 189*  BUN 44* 26* 36* 46* 58* 33* 42* 52* 57* 60* 22 27*  31* 41*  CREATININE 5.40* 3.94* 5.69* 6.89* 8.60* 5.69* 7.13* 8.40* 9.08* 9.21* 4.25* 5.32* 5.65* 6.77*  CALCIUM 9.7 8.9 8.9 9.4 9.4 9.0 9.0 9.5 9.3 9.4 9.2 9.6 9.6 9.7  PHOS 4.4 3.1 5.5* 6.2* 6.6* 4.9* 5.3*  --   --   --   --   --   --   --     CBC Recent Labs  Lab 04/25/22 0255 04/25/22 0534 04/26/22 0356 04/30/22 0840  WBC 21.6*  --  22.2* 16.6*  HGB 9.0* 8.5* 8.3* 6.5*  HCT 29.6* 25.0* 27.6* 21.8*  MCV 88.4  --  89.0 89.7  PLT 322  --  313 251       Medications:     amoxicillin-clavulanate  500 mg Per Tube Q12H   arformoterol  15 mcg Nebulization BID   atorvastatin  80 mg Per Tube Daily   budesonide (PULMICORT) nebulizer solution  0.5 mg Nebulization BID   Chlorhexidine Gluconate Cloth  6 each Topical Daily   famotidine  20 mg Per Tube Daily   feeding supplement  237 mL Per Tube TID BM   feeding supplement (PROSource TF20)  60 mL Per Tube BID   heparin  5,000 Units Subcutaneous Q8H   insulin aspart  1-3 Units Subcutaneous Q4H   insulin aspart  13 Units Subcutaneous  Q4H   insulin detemir  38 Units Subcutaneous Q12H   midodrine  10 mg Per Tube Once   midodrine  30 mg Per Tube Q8H   multivitamin  1 tablet Per Tube QHS   mouth rinse  15 mL Mouth Rinse 4 times per day   simethicone  80 mg Per Tube QID   sodium chloride flush  10-40 mL Intracatheter Q12H   Rexene Agent, MD 05/01/2022, 8:50 AM

## 2022-05-02 ENCOUNTER — Inpatient Hospital Stay (HOSPITAL_COMMUNITY): Payer: Medicare Other

## 2022-05-02 ENCOUNTER — Encounter (HOSPITAL_COMMUNITY): Payer: Self-pay | Admitting: Family Medicine

## 2022-05-02 DIAGNOSIS — E1110 Type 2 diabetes mellitus with ketoacidosis without coma: Secondary | ICD-10-CM | POA: Diagnosis not present

## 2022-05-02 LAB — CBC
HCT: 26.7 % — ABNORMAL LOW (ref 39.0–52.0)
Hemoglobin: 8.2 g/dL — ABNORMAL LOW (ref 13.0–17.0)
MCH: 26.7 pg (ref 26.0–34.0)
MCHC: 30.7 g/dL (ref 30.0–36.0)
MCV: 87 fL (ref 80.0–100.0)
Platelets: 275 10*3/uL (ref 150–400)
RBC: 3.07 MIL/uL — ABNORMAL LOW (ref 4.22–5.81)
RDW: 17.3 % — ABNORMAL HIGH (ref 11.5–15.5)
WBC: 16.2 10*3/uL — ABNORMAL HIGH (ref 4.0–10.5)
nRBC: 0.2 % (ref 0.0–0.2)

## 2022-05-02 LAB — GLUCOSE, CAPILLARY
Glucose-Capillary: 202 mg/dL — ABNORMAL HIGH (ref 70–99)
Glucose-Capillary: 199 mg/dL — ABNORMAL HIGH (ref 70–99)
Glucose-Capillary: 224 mg/dL — ABNORMAL HIGH (ref 70–99)
Glucose-Capillary: 173 mg/dL — ABNORMAL HIGH (ref 70–99)
Glucose-Capillary: 261 mg/dL — ABNORMAL HIGH (ref 70–99)
Glucose-Capillary: 170 mg/dL — ABNORMAL HIGH (ref 70–99)

## 2022-05-02 LAB — RENAL FUNCTION PANEL
Albumin: 2.6 g/dL — ABNORMAL LOW (ref 3.5–5.0)
Anion gap: 21 — ABNORMAL HIGH (ref 5–15)
BUN: 58 mg/dL — ABNORMAL HIGH (ref 8–23)
CO2: 20 mmol/L — ABNORMAL LOW (ref 22–32)
Calcium: 9.7 mg/dL (ref 8.9–10.3)
Chloride: 92 mmol/L — ABNORMAL LOW (ref 98–111)
Creatinine, Ser: 8.72 mg/dL — ABNORMAL HIGH (ref 0.61–1.24)
GFR, Estimated: 6 mL/min — ABNORMAL LOW (ref >60)
Glucose, Bld: 241 mg/dL — ABNORMAL HIGH (ref 70–99)
Phosphorus: 6.3 mg/dL — ABNORMAL HIGH (ref 2.5–4.6)
Potassium: 4.5 mmol/L (ref 3.5–5.1)
Sodium: 133 mmol/L — ABNORMAL LOW (ref 135–145)

## 2022-05-02 LAB — MAGNESIUM: Magnesium: 2.2 mg/dL (ref 1.7–2.4)

## 2022-05-02 LAB — TRIGLYCERIDES: Triglycerides: 409 mg/dL — ABNORMAL HIGH (ref <150)

## 2022-05-02 MED ORDER — SALINE SPRAY 0.65 % NA SOLN
1.0000 | NASAL | Status: DC | PRN
  Filled 2022-05-02 (×2): qty 44

## 2022-05-02 MED ORDER — INSULIN ASPART 100 UNIT/ML IJ SOLN
3.0000 [IU] | Freq: Once | INTRAMUSCULAR | Status: AC
Start: 2022-05-02 — End: 2022-05-02
  Administered 2022-05-02: 3 [IU] via SUBCUTANEOUS

## 2022-05-02 MED ORDER — INSULIN DETEMIR 100 UNIT/ML ~~LOC~~ SOLN
10.0000 [IU] | Freq: Every day | SUBCUTANEOUS | Status: AC
  Administered 2022-05-02: 10 [IU] via SUBCUTANEOUS
  Filled 2022-05-02: qty 0.1

## 2022-05-02 MED ORDER — INSULIN DETEMIR 100 UNIT/ML ~~LOC~~ SOLN
45.0000 [IU] | Freq: Two times a day (BID) | SUBCUTANEOUS | Status: DC
  Filled 2022-05-02 (×2): qty 0.45

## 2022-05-02 MED ORDER — QUETIAPINE FUMARATE 50 MG PO TABS
50.0000 mg | ORAL_TABLET | Freq: Every day | ORAL | Status: DC
Start: 2022-05-02 — End: 2022-05-06
  Administered 2022-05-02 – 2022-05-05 (×4): 50 mg
  Filled 2022-05-02 (×4): qty 1

## 2022-05-02 MED ORDER — INSULIN DETEMIR 100 UNIT/ML ~~LOC~~ SOLN
45.0000 [IU] | Freq: Two times a day (BID) | SUBCUTANEOUS | Status: DC
  Filled 2022-05-02: qty 0.45

## 2022-05-02 MED ORDER — SODIUM CHLORIDE 0.9 % IV SOLN: 250.0000 mL | INTRAVENOUS | Status: DC

## 2022-05-02 MED ORDER — CEFAZOLIN SODIUM-DEXTROSE 2-4 GM/100ML-% IV SOLN
2.0000 g | INTRAVENOUS | Status: AC
  Filled 2022-05-02 (×2): qty 100

## 2022-05-02 MED ORDER — MIDODRINE HCL 5 MG PO TABS
40.0000 mg | ORAL_TABLET | Freq: Three times a day (TID) | ORAL | Status: DC
Start: 2022-05-02 — End: 2022-05-07
  Administered 2022-05-02 – 2022-05-07 (×14): 40 mg
  Filled 2022-05-02 (×15): qty 8

## 2022-05-02 MED ORDER — NOREPINEPHRINE 4 MG/250ML-% IV SOLN: 2.0000 ug/min | INTRAVENOUS | Status: DC

## 2022-05-02 NOTE — Progress Notes (Signed)
Pt placed on BIPAP by RN due to pt asking to wear it. Pt is tolerating well at this time.

## 2022-05-02 NOTE — Progress Notes (Signed)
Pt was removed from [facility] and placed on 13L HFNC (salter). Sats are 100%. RT to continue to monitor.

## 2022-05-02 NOTE — Progress Notes (Signed)
An USGPIV (ultrasound guided PIV) has been placed for short-term vasopressor infusion. A correctly placed ivWatch must be used when administering Vasopressors. Should this treatment be needed beyond 72 hours, central line access should be obtained.  It will be the responsibility of the bedside nurse to follow best practice to prevent extravasations.  Area marked where the tip of the iv catheter is, so that the ivWatch can be placed; encouraged to take BP in opposite arm.

## 2022-05-02 NOTE — Progress Notes (Signed)
[facility] Progress Note   Assessment/ Plan:   Anuric dialysis dependent AKI/CKD stage IIIb-IV - presumably ischemic ATN in setting of acute pancreatitis, hypotension, pressor support, with concomitant ARB, SGLT-2 inhibitor and finerenone.  Initially non-oliguric but now UOP has stopped Continue to hold losartan, Jardiance, and [place].   Started on CRRT 04/13/22 - 04/25/22.   Now line holiday Start iHD 8/21, tolerating on MWF schedule HD Rx: midodrine and bolus albumin: 2K, 1-2L UF,  Follow clinically over weekend, IR following for [facility] maybe on 8/28  Acute pancreatitis: resolved  Shock -on off pressors per PCCM; 20 TID Midodrin Acute hypoxic respiratory failure - with underlying COPD and OSA.  Per PCCM Hep C with advanced liver fibrosis Chronic diastolic CHF - stable. Anemia; transfuse per CCM DM2  Subjective:    Req low dose NE on/off 0.1L UOP No interval events    Objective:   BP (!) 116/46   Pulse (!) 109   Temp 98.3 F (36.8 C) (Oral)   Resp (!) 23   Ht '5\' 8"'$  (1.727 m)   Wt 119.6 kg   SpO2 97%   BMI 40.09 kg/m   Physical Exam: BJS:EGBTD, tachycardic CVS: tachycardic Resp: CTAB on HFNC Abd: soft, obese,  Ext: no LE edema ACCESS: none  Labs: BMET Recent Labs  Lab 04/26/22 0356 04/26/22 1816 04/27/22 0516 04/27/22 1536 04/28/22 0338 04/28/22 1550 04/29/22 0332 04/29/22 1515 04/29/22 2141 04/30/22 0317 04/30/22 1255 04/30/22 1725 04/30/22 2114 05/01/22 0600 05/01/22 0946  NA 133* 132* 131* 134* 133* 131* 128*   < > 133* 131* 134* 135 132* 132* 133*  K 5.3* 3.9 4.4 4.5 4.7 4.1 4.4   < > 4.3 4.1 3.4* 3.8 3.9 4.0 4.0  CL 97* 93* 94* 95* 95* 91* 89*   < > 91* 91* 91* 91* 91* 91* 91*  CO2 20* '26 26 25 23 24 24   '$ < > '24 24 27 28 24 24 24  '$ GLUCOSE 179* 173* 197* 257* 290* 305* 417*   < > 222* 235* 155* 202* 178* 189* 223*  BUN 44* 26* 36* 46* 58* 33* 42*   < > 57* 60* 22 27* 31* 41* 40*  CREATININE 5.40* 3.94* 5.69* 6.89* 8.60* 5.69*  7.13*   < > 9.08* 9.21* 4.25* 5.32* 5.65* 6.77* 7.24*  CALCIUM 9.7 8.9 8.9 9.4 9.4 9.0 9.0   < > 9.3 9.4 9.2 9.6 9.6 9.7 9.7  PHOS 4.4 3.1 5.5* 6.2* 6.6* 4.9* 5.3*  --   --   --   --   --   --   --   --    < > = values in this interval not displayed.    CBC Recent Labs  Lab 04/26/22 0356 04/30/22 0840 05/01/22 0600 05/02/22 0051  WBC 22.2* 16.6* 15.4* 16.2*  HGB 8.3* 6.5* 7.7* 8.2*  HCT 27.6* 21.8* 24.2* 26.7*  MCV 89.0 89.7 84.3 87.0  PLT 313 251 230 275       Medications:     amoxicillin-clavulanate  500 mg Per Tube Q12H   arformoterol  15 mcg Nebulization BID   atorvastatin  80 mg Per Tube Daily   budesonide (PULMICORT) nebulizer solution  0.5 mg Nebulization BID   Chlorhexidine Gluconate Cloth  6 each Topical Daily   feeding supplement  237 mL Per Tube TID BM   feeding supplement (PROSource TF20)  60 mL Per Tube BID   heparin  5,000 Units Subcutaneous Q8H   insulin aspart  1-3 Units Subcutaneous Q4H   insulin aspart  13 Units Subcutaneous Q4H   insulin detemir  38 Units Subcutaneous Q12H   midodrine  30 mg Per Tube Q8H   multivitamin  1 tablet Per Tube QHS   mouth rinse  15 mL Mouth Rinse 4 times per day   pantoprazole  40 mg Per Tube BID   simethicone  80 mg Per Tube QID   sodium chloride flush  10-40 mL Intracatheter Q12H   Rexene Agent, MD 05/02/2022, 9:22 AM

## 2022-05-02 NOTE — Progress Notes (Signed)
[place] Progress Note Patient Name: Mathew Lara DOB: 08/31/1961 MRN: [id number]   Date of Service  05/02/2022  HPI/Events of Note  CBG 261 on sensitive scale which doesn't cover CBG 261. NPO at midnight for procedure tomorrow. BSRN asking if you could change scale or give additional insulin dose to prevent insulin drip  Creatinine 8.72  eICU Interventions  Ordered a one time dose of 3 units Novolog     Intervention Category Intermediate Interventions: Hyperglycemia - evaluation and treatment  [name] 05/02/2022, 8:20 PM

## 2022-05-02 NOTE — Progress Notes (Signed)
NAME:  Mathew Lara, MRN:  [id number], DOB:  08/13/1961, LOS: 44 ADMISSION DATE:  04/10/2022, CONSULTATION DATE:  8/5 REFERRING MD:  Darrell Jewel, CHIEF COMPLAINT: Abdominal pain  History of Present Illness:  Mathew Lara is an 61 y.o. M who presented to the [facility] ED on 8/5 with a chief complaint of abdominal pain.  They have a past medical history of alcohol abuse, anxiety, CKD 3, COPD, OSA, type 2 diabetes, hypertension, hyperlipidemia, hep C+  ED work-up revealed DKA, acute pancreatitis, AKI.  He was admitted to the hospital service.  Overnight 8/5 he developed continuing respiratory distress, shock.   PCCM was consulted for transfer to [facility] for further management.   Pertinent  Medical History  Alcohol abuse, anxiety, CKD 3, COPD, OSA, type 2 diabetes, hypertension, hyperlipidemia, hep C  Significant Hospital Events: Including procedures, antibiotic start and stop dates in addition to other pertinent events   8/5 Presented to [facility] ED, transferred [facility] 8/7 On pressors  8/8 BiPAP, worsening renal parameters 8/9 Tolerating CRRT, on BiPAP 8/10 has been off BiPAP, on high flow oxygen and tolerating 8/11 UOP improving. CRRT with net even UF 8/14 CT reveals worsening cavitary pneumonia and acute pancreatitis, large BM 8/15 abx changed for cavitary pneumonia 8/16 ID consult.  ID stopped vancomycin, Zosyn, azithromycin >narrowed to augmentin. CRRT stopped.   8/17 CRRT restarted.  Off pressors 8/19 Remains off pressors, CRRT ongoing. HD cath required tPA.  8/21 iHD, off CRRT  Interim History / Subjective:  Slept poorly but more lucid this AM. Remains on pressors.  Objective   Blood pressure 104/79, pulse (!) 110, temperature 98.3 F (36.8 C), temperature source Oral, resp. rate 20, height '5\' 8"'$  (1.727 m), weight 119.6 kg, SpO2 97 %.    Vent Mode: PCV;BIPAP FiO2 (%):  [40 %] 40 % Set Rate:  [16 bmp] 16 bmp Pressure Support:  [5 cmH20] 5 cmH20    Intake/Output Summary (Last 24 hours) at 05/02/2022 0936 Last data filed at 05/02/2022 0900 Gross per 24 hour  Intake 2202.89 ml  Output 525 ml  Net 1677.89 ml    Filed Weights   04/30/22 0748 05/01/22 0500 05/02/22 0420  Weight: 123.2 kg 120 kg 119.6 kg   Physical Exam: No distress Breath sounds diminished due to shallow inspiratory effort Abd soft, +BS Heart sounds tachycardic, ext warm No edema Moves all 4 ext but weak  BMP ok CBC pending  Resolved Hospital Problem list     Assessment & Plan:  Persistent shock state- lingering vasoplegia in setting of chronic anemia, new renal failure, prolonged critical illness.  Echo okay. Cavitary PNA vs. Infected bullae Acute on chronic anemia- no s/s of bleeding; responded to transfusion 8/25 Emphysematous lung disease- DLCO 38% pred 02/12/20 Acute hypoxemic/hypercarbic respiratory failure- still needing BIPAP qHS and PRN during day Some combination gallstone vs. Hyper-TG pancreatitis- improved Acute on chronic renal failure- now HD dependent Severe muscular deconditioning- not POA, acquired as part of prolonged critical illnesss DM2 with hyperglycemia Hx HTN, HLD, OSA, HepC  - Continue increased midodrine - Start qHS seroquel - Levophed for lower MAP goal to 55, watch for s/s of malperfusion - Check iron studies: c/w AOCD - Appreciate PT/OT efforts, keep in chair most of day - BIPAP qHS and PRN, continue nebs as ordered - Basal bolus insulin titrated to glucose 100-180: increase levemir - Augmentin to complete 3 weeks treatment - Stable for LTACH placement  Best Practice (right click and "Reselect all  SmartList Selections" daily)  Diet/type: tubefeeds and clear liquids DVT prophylaxis: prophylactic heparin  GI prophylaxis: N/A Lines: on holiday, [facility] tomorrow Foley:  N/A Code Status:  full code Last date of multidisciplinary goals of care discussion: full scope  34 min cc time Erskine Emery MD PCCM 05/02/2022, 9:36  AM Please see [email or web address] for pager details.   From 7A-7P if no response, please call [phone] After hours, please call ELink [phone]

## 2022-05-02 NOTE — Inpatient Diabetes Management (Signed)
Inpatient Diabetes Program Recommendations  AACE/ADA: New Consensus Statement on Inpatient Glycemic Control (2015)  Target Ranges:  Prepandial:   less than 140 mg/dL      Peak postprandial:   less than 180 mg/dL (1-2 hours)      Critically ill patients:  140 - 180 mg/dL    Latest Reference Range & Units 05/01/22 00:52 05/01/22 03:22 05/01/22 07:30 05/01/22 11:35 05/01/22 15:19 05/01/22 19:53  Glucose-Capillary 70 - 99 mg/dL 173 (H)  IV Insulin Drip  23 units Levemir '@2352'$  199 (H)  15 units Novolog  196 (H)  15 units Novolog  173 (H)  2 units Novolog '@1338'$   38 units Levemir '@1021'$  232 (H)  16 units Novolog  216 (H)  16 units Novolog   38 units Levemir '@2230'$   (H): Data is abnormally high  Latest Reference Range & Units 05/01/22 23:46 05/02/22 03:36 05/02/22 08:04  Glucose-Capillary 70 - 99 mg/dL 227 (H)  16 units Novolog  202 (H)  16 units Novolog  199 (H)  (H): Data is abnormally high    Home DM Meds: Jardiance 10 mg daily   Current Orders: Levemir 38 units BID   Novolog 1-2-3 units Q4H  Novolog 13 units Q4H   MD- Note Tube Feeds running 60cc/hr.  CBGs >200 since late yesterday afternoon.  Please consider increasing the Novolog Tube Feed Coverage to 16 units Q4 hours    --Will follow patient during hospitalization--  Wyn Quaker RN, MSN, [place] Diabetes Coordinator Inpatient Glycemic Control Team Team Pager: [phone] (8a-5p)

## 2022-05-02 NOTE — Progress Notes (Signed)
Pt removed from BIPAP and placed back on HHFNC.

## 2022-05-02 NOTE — Consult Note (Signed)
Chief Complaint: Patient was seen in consultation today for renal dysfunction  Referring Physician(s): Dr. Pearson Grippe  Supervising Physician: Daryll Brod  Patient Status: [facility] - In-pt  History of Present Illness: Mathew Lara is a 61 y.o. male with history of alcohol abuse, anxiety, COPD, DM, Hep C who presented to [facility] with abdominal pain.  He was found to have DKA, acute pancreatitis, and acute kidney injury which progressed to respiratory failure and shock.  He was transferred to [facility] where he was intubated and started on CRRT.  Patient now undergoing line holiday after transition away from CRRT.  He has been extubated and is on HFNC at time of visit.  IR consulted for tunneled dialysis catheter due to ongoing need for iHD.   Patient assessed at bedside. He is alert, pleasant, answers all questions.  He is oriented, however appears he is still processing all the information/change over the past week.  He tells me they are not doing dialysis and that he doesn't know about a dialysis catheter placement, but also says "they said I'd have to do it [dialysis] 3 out of 7 days."    Patient also still requiring vasopressors with slightly increasing WBC, now 16.2. Afebrile.   Past Medical History:  Diagnosis Date   Alcohol abuse    Anxiety    Bronchitis    Chronic hip pain    CKD (chronic kidney disease) stage 3, GFR 30-59 ml/min (HCC)    COPD (chronic obstructive pulmonary disease) ([place])    DKA (diabetic ketoacidosis) ([place]) 04/10/2022   Essential hypertension    Hepatitis C    Mixed hyperlipidemia    Sleep apnea     Past Surgical History:  Procedure Laterality Date   BALLOON DILATION N/A 07/06/2021   Procedure: BALLOON DILATION;  Surgeon: [name], DO;  Location: [facility];  Service: Endoscopy;  Laterality: N/A;   BIOPSY  07/06/2021   Procedure: BIOPSY;  Surgeon: [name], DO;  Location: [facility];  Service: Endoscopy;;   CATARACT EXTRACTION  Right    CHOLECYSTECTOMY N/A 03/23/2021   Procedure: LAPAROSCOPIC CHOLECYSTECTOMY;  Surgeon: [name], MD;  Location: [facility] ORS;  Service: General;  Laterality: N/A;   COLONOSCOPY N/A 01/23/2014   SLF:The LEFT COLON IS EXTREMELY redundant/TWO RECTAL POLYPS REMOVED/SMALL VOLUME RECTAL BLEEDING MOST LIKELY DUE TO Small internal hemorrhoids. path with prolapse type polyp of benign-mucosa. no adenomas   COLONOSCOPY WITH PROPOFOL N/A 07/06/2021   non-bleeding internal hemorrhoids, one 8 mm polyp in sigmod s/p removal and clip placed. One 5 mm polyp at recto-sigmoid s/p removal. 3 year surveillance. Tubular adenomas   ESOPHAGOGASTRODUODENOSCOPY N/A 01/23/2014   MWN:UUVOZDGU ring at the gastroesophageal junction/Medium sized hiatal hernia/NDYSPEPSIA MOST LIKELY DUE TO GERD/MILD Non-erosive gastritis   ESOPHAGOGASTRODUODENOSCOPY N/A 04/18/2018   food impaction, small hiatal hernia   ESOPHAGOGASTRODUODENOSCOPY (EGD) WITH PROPOFOL N/A 07/06/2021   moderate Schatzki ring s/p dilation, gastritis s/p biopsy, normal duodenum. Negative H.pylori.   HEMORRHOID BANDING  01/23/2014   Procedure: HEMORRHOID BANDING;  Surgeon: Danie Binder, MD;  Location: [facility];  Service: Endoscopy;;   JOINT REPLACEMENT Right 2017   LIVER BIOPSY N/A 03/23/2021   Procedure: LAPAROSCOPIC LIVER BIOPSY;  Surgeon: [name], MD;  Location: [facility]S;  Service: General;  Laterality: N/A;   MOUTH SURGERY     POLYPECTOMY  07/06/2021   Procedure: POLYPECTOMY;  Surgeon: [name], DO;  Location: [facility];  Service: Endoscopy;;   TOTAL HIP  ARTHROPLASTY Right 06/04/2016   Procedure: RIGHT TOTAL HIP ARTHROPLASTY ANTERIOR APPROACH;  Surgeon: Mcarthur Rossetti, MD;  Location: [facility]S;  Service: Orthopedics;  Laterality: Right;   TOTAL HIP ARTHROPLASTY Left 03/02/2019   Procedure: LEFT TOTAL HIP ARTHROPLASTY ANTERIOR APPROACH;  Surgeon: Mcarthur Rossetti, MD;  Location: [facility] ORS;  Service: Orthopedics;   Laterality: Left;    Allergies: Patient has no known allergies.  Medications: Prior to Admission medications   Medication Sig Start Date End Date Taking? Authorizing Provider  albuterol (PROAIR HFA) 108 (90 Base) MCG/ACT inhaler Inhale 2 puffs into the lungs every 4 (four) hours as needed for wheezing or shortness of breath. 02/15/22   Chesley Mires, MD  amLODipine (NORVASC) 10 MG tablet Take 10 mg by mouth daily. 02/05/19   [provider]  amoxicillin-clavulanate (AUGMENTIN) 875-125 MG tablet Take 1 tablet by mouth 2 (two) times daily. 03/16/22   Chesley Mires, MD  aspirin EC 81 MG tablet Take 81 mg by mouth daily.    [provider]  cholecalciferol (VITAMIN D) 25 MCG (1000 UNIT) tablet Take 1,000 Units by mouth daily.    [provider]  DULoxetine (CYMBALTA) 60 MG capsule Take 60 mg by mouth daily. 06/15/21   [provider]  fenofibrate 160 MG tablet Take 160 mg by mouth daily. 02/26/22   [provider]  hydrALAZINE (APRESOLINE) 50 MG tablet Take 100 mg by mouth at bedtime. 02/04/22   [provider]  hydrOXYzine (ATARAX) 50 MG tablet Take 50 mg by mouth at bedtime. 02/05/22   [provider]  hydrOXYzine (ATARAX/VISTARIL) 25 MG tablet Take 50 mg by mouth at bedtime.    [provider]  JARDIANCE 10 MG TABS tablet Take 10 mg by mouth every morning. 03/08/22   [provider]  KERENDIA 10 MG TABS Take 1 tablet by mouth daily. 03/16/22   [provider]  loratadine (CLARITIN) 10 MG tablet Take 1 tablet (10 mg total) by mouth daily. 09/02/19   Barton Dubois, MD  losartan (COZAAR) 50 MG tablet Take 50 mg by mouth daily. 02/11/20   [provider]  oxyCODONE (OXY IR/ROXICODONE) 5 MG immediate release tablet Take 5 mg by mouth every 8 (eight) hours as needed for moderate pain. 02/25/20   [provider]  pantoprazole (PROTONIX) 40 MG tablet Take 1 tablet (40 mg total) by mouth 2 (two) times daily  before a meal. 30 minutes before breakfast 03/08/22 09/04/22  Annitta Needs, NP  sildenafil (VIAGRA) 100 MG tablet Take 100 mg by mouth daily as needed for erectile dysfunction. 04/21/21   [provider]     Family History  Problem Relation Age of Onset   Cancer Mother    Colon cancer Neg Hx    Colon polyps Neg Hx     Social History   Socioeconomic History   Marital status: Single    Spouse name: Not on file   Number of children: 1   Years of education: 10   Highest education level: Not on file  Occupational History    Comment: [employer]  Tobacco Use   Smoking status: Former    Packs/day: 1.00    Years: 42.00    Total pack years: 42.00    Types: Cigarettes    Start date: 09/07/1971    Quit date: 05/28/2014    Years since quitting: 7.9    Passive exposure: Past   Smokeless tobacco: Never   Tobacco comments:    quit  in November 2015 after smoking x 20 yrs.  Vaping Use   Vaping Use: Never used  Substance and Sexual Activity   Alcohol use: Not Currently    Alcohol/week: 0.0 standard drinks of alcohol   Drug use: No   Sexual activity: Not on file  Other Topics Concern   Not on file  Social History Narrative       patient consumes 4 cups of caffeine daily.      Patient stopped drinking years ago.   Social Determinants of Health   Financial Resource Strain: Not on file  Food Insecurity: Not on file  Transportation Needs: Not on file  Physical Activity: Not on file  Stress: Not on file  Social Connections: Not on file     Review of Systems: A 12 point ROS discussed and pertinent positives are indicated in the HPI above.  All other systems are negative.  Review of Systems  Unable to perform ROS: Acuity of condition    Vital Signs: BP (!) 79/58   Pulse (!) 114   Temp 98.7 F (37.1 C) (Oral)   Resp (!) 24   Ht '5\' 8"'$  (1.727 m)   Wt 263 lb 10.7 oz (119.6 kg)   SpO2 96%   BMI 40.09 kg/m   Physical Exam Constitutional:      General: He is not in  acute distress.    Appearance: He is well-developed. He is ill-appearing.  Neck:     Comments: No temp line Cardiovascular:     Rate and Rhythm: Regular rhythm. Tachycardia present.     Comments: No temp line in groin Pulmonary:     Effort: Pulmonary effort is normal.     Breath sounds: Rhonchi present.     Comments: On HFNC Musculoskeletal:     Cervical back: Normal range of motion and neck supple.  Skin:    General: Skin is warm and dry.  Neurological:     General: No focal deficit present.     Mental Status: He is alert and oriented to person, place, and time.  Psychiatric:        Mood and Affect: Mood normal.        Behavior: Behavior normal.      MD Evaluation Airway: WNL Heart: WNL Abdomen: WNL Chest/ Lungs: WNL ASA  Classification: 3 Mallampati/Airway Score: Two   Imaging: DG Chest Port 1 View  Result Date: 05/02/2022 CLINICAL DATA:  [id number].  Pneumonia follow-up. EXAM: PORTABLE CHEST 1 VIEW COMPARISON:  Portable chest August 24, chest CT April 19, 2022. FINDINGS: 5:07 a.m. Feeding tube enters the stomach but the intragastric course was not filmed. Interval removal right IJ double-lumen catheter with no pneumothorax. The known thick-walled left upper lobe cavitary lesion is less conspicuous today and may even be smaller in size and less thickened. Upper lobe emphysematous changes and bullous disease on the right are unaltered. There is persistent basilar atelectatic change without convincing pneumonia. Mild elevation right hemidiaphragm. The sulci are sharp. Stable mediastinum with aortic tortuosity and ectasia. Cardiomegaly without CHF. IMPRESSION: Left upper lobe thick-walled cavitary lesion is less conspicuous today. Persistent basilar atelectatic bands.  Stable cardiomegaly. Electronically Signed   By: Telford Nab M.D.   On: 05/02/2022 07:32   DG Abd 1 View  Result Date: 05/01/2022 CLINICAL DATA:  Evaluate NG tube EXAM: ABDOMEN - 1 VIEW COMPARISON:  None  Available. FINDINGS: The feeding tube with a weighted distal tip remains in the region of the ligament of Treitz, similar in  the interval. No other changes. No new tubes identified. IMPRESSION: The feeding tube is in good position. A new tube is not visualized. No other abnormalities. Electronically Signed   By: Dorise Bullion III M.D.   On: 05/01/2022 11:58   DG CHEST PORT 1 VIEW  Result Date: 04/29/2022 CLINICAL DATA:  Pneumonia. EXAM: PORTABLE CHEST 1 VIEW COMPARISON:  Chest x-ray dated April 26, 2022. FINDINGS: Unchanged right internal jugular dialysis catheter and feeding tube. Stable cardiomediastinal silhouette. Emphysematous changes again noted with upper lobe bullae. Inflamed, thick-walled bulla in the anterior left upper lobe seen on recent CT is not apparent by x-ray. Mild basilar atelectasis. No focal consolidation, pleural effusion, or pneumothorax. No acute osseous abnormality. IMPRESSION: 1. Inflamed, thick-walled bulla in the anterior left upper lobe seen on recent CT is not apparent by x-ray. 2. COPD. Electronically Signed   By: Titus Dubin M.D.   On: 04/29/2022 10:16   VAS [place] LOWER EXTREMITY VENOUS (DVT)  Result Date: 04/28/2022  Lower Venous DVT Study Patient Name:  GERALDO HARIS  Date of Exam:   04/28/2022 Medical Rec #: [id number]         Accession #:    [id number] Date of Birth: August 26, 1961         Patient Gender: M Patient Age:   25 years Exam Location:  [facility] Procedure:      VAS [place] LOWER EXTREMITY VENOUS (DVT) Referring Phys: Ina Homes --------------------------------------------------------------------------------  Indications: Edema.  Comparison Study: No previous exam noted. Performing Technologist: Bobetta Lime BS, RVT  Examination Guidelines: A complete evaluation includes B-mode imaging, spectral Doppler, color Doppler, and power Doppler as needed of all accessible portions of each vessel. Bilateral testing is considered an integral part of a complete  examination. Limited examinations for reoccurring indications may be performed as noted. The reflux portion of the exam is performed with the patient in reverse Trendelenburg.  +---------+---------------+---------+-----------+----------+--------------+ RIGHT    CompressibilityPhasicitySpontaneityPropertiesThrombus Aging +---------+---------------+---------+-----------+----------+--------------+ CFV      Full           Yes      Yes                                 +---------+---------------+---------+-----------+----------+--------------+ SFJ      Full                                                        +---------+---------------+---------+-----------+----------+--------------+ FV Prox  Full                                                        +---------+---------------+---------+-----------+----------+--------------+ FV Mid   Full                                                        +---------+---------------+---------+-----------+----------+--------------+ FV DistalFull                                                        +---------+---------------+---------+-----------+----------+--------------+  PFV      Full                                                        +---------+---------------+---------+-----------+----------+--------------+ POP      Full           Yes      Yes                                 +---------+---------------+---------+-----------+----------+--------------+ PTV      Full                                                        +---------+---------------+---------+-----------+----------+--------------+ PERO     Full                                                        +---------+---------------+---------+-----------+----------+--------------+   +---------+---------------+---------+-----------+----------+--------------+ LEFT     CompressibilityPhasicitySpontaneityPropertiesThrombus Aging  +---------+---------------+---------+-----------+----------+--------------+ CFV      Full           Yes      Yes                                 +---------+---------------+---------+-----------+----------+--------------+ SFJ      Full                                                        +---------+---------------+---------+-----------+----------+--------------+ FV Prox  Full                                                        +---------+---------------+---------+-----------+----------+--------------+ FV Mid   Full                                                        +---------+---------------+---------+-----------+----------+--------------+ FV DistalFull                                                        +---------+---------------+---------+-----------+----------+--------------+ PFV      Full                                                        +---------+---------------+---------+-----------+----------+--------------+  POP      Full           Yes      Yes                                 +---------+---------------+---------+-----------+----------+--------------+ PTV      Full                                                        +---------+---------------+---------+-----------+----------+--------------+ PERO     Full                                                        +---------+---------------+---------+-----------+----------+--------------+     Summary: BILATERAL: - No evidence of deep vein thrombosis seen in the lower extremities, bilaterally. -No evidence of popliteal cyst, bilaterally.   *See table(s) above for measurements and observations. Electronically signed by Harold Barban MD on 04/28/2022 at 8:31:00 PM.    Final    ECHOCARDIOGRAM COMPLETE  Result Date: 04/27/2022    ECHOCARDIOGRAM REPORT   Patient Name:   BLANTON KARDELL Date of Exam: 04/27/2022 Medical Rec #:  [id number]        Height:       68.0 in Accession #:     [id number]       Weight:       264.1 lb Date of Birth:  1961/04/13        BSA:          2.300 m Patient Age:    11 years         BP:           116/65 mmHg Patient Gender: M                HR:           118 bpm. Exam Location:  Inpatient Procedure: 2D Echo, Cardiac Doppler and Color Doppler Indications:    DCM  History:        Patient has prior history of Echocardiogram examinations, most                 recent 12/25/2019. COPD; Risk Factors:Hypertension, Dyslipidemia                 and Former Smoker.  Sonographer:    Bernadene Person RDCS Referring Phys: [id number] Candee Furbish  Sonographer Comments: Image acquisition challenging due to patient body habitus and Image acquisition challenging due to COPD. IMPRESSIONS  1. Left ventricular ejection fraction, by estimation, is 60 to 65%. The left ventricle has normal function. The left ventricle has no regional wall motion abnormalities. Left ventricular diastolic parameters are consistent with Grade I diastolic dysfunction (impaired relaxation).  2. Right ventricular systolic function is normal. The right ventricular size is normal. Tricuspid regurgitation signal is inadequate for assessing PA pressure.  3. The mitral valve is normal in structure. No evidence of mitral valve regurgitation.  4. The aortic valve is tricuspid. Aortic valve regurgitation is not visualized.  5. There is borderline dilatation of the ascending aorta, measuring 39 mm.  6. The inferior  vena cava is normal in size with greater than 50% respiratory variability, suggesting right atrial pressure of 3 mmHg. Comparison(s): No significant change from prior study. FINDINGS  Left Ventricle: Left ventricular ejection fraction, by estimation, is 60 to 65%. The left ventricle has normal function. The left ventricle has no regional wall motion abnormalities. The left ventricular internal cavity size was normal in size. Left ventricular diastolic parameters are consistent with Grade I diastolic dysfunction  (impaired relaxation). Right Ventricle: The right ventricular size is normal. Right ventricular systolic function is normal. Tricuspid regurgitation signal is inadequate for assessing PA pressure. Left Atrium: Left atrial size was normal in size. Right Atrium: Right atrial size was normal in size. Pericardium: There is no evidence of pericardial effusion. Mitral Valve: The mitral valve is normal in structure. No evidence of mitral valve regurgitation. Tricuspid Valve: Tricuspid valve regurgitation is not demonstrated. Aortic Valve: The aortic valve is tricuspid. Aortic valve regurgitation is not visualized. Aorta: There is borderline dilatation of the ascending aorta, measuring 39 mm. Venous: The inferior vena cava is normal in size with greater than 50% respiratory variability, suggesting right atrial pressure of 3 mmHg. IAS/Shunts: The interatrial septum was not well visualized.  LEFT VENTRICLE PLAX 2D LVIDd:         4.70 cm   Diastology LVIDs:         3.10 cm   LV e' medial:    6.85 cm/s LV PW:         1.10 cm   LV E/e' medial:  7.8 LV IVS:        1.10 cm   LV e' lateral:   8.92 cm/s LVOT diam:     2.30 cm   LV E/e' lateral: 6.0 LV SV:         82 LV SV Index:   36 LVOT Area:     4.15 cm  RIGHT VENTRICLE RV S prime:     12.30 cm/s TAPSE (M-mode): 0.9 cm LEFT ATRIUM           Index        RIGHT ATRIUM           Index LA diam:      4.30 cm 1.87 cm/m   RA Area:     17.60 cm LA Vol (A4C): 47.7 ml 20.74 ml/m  RA Volume:   48.10 ml  20.91 ml/m  AORTIC VALVE LVOT Vmax:   146.00 cm/s LVOT Vmean:  101.000 cm/s LVOT VTI:    0.198 m  AORTA Ao Root diam: 3.90 cm Ao Asc diam:  3.90 cm MITRAL VALVE MV Area (PHT): 4.60 cm    SHUNTS MV Decel Time: 165 msec    Systemic VTI:  0.20 m MV E velocity: 53.40 cm/s  Systemic Diam: 2.30 cm MV A velocity: 59.20 cm/s MV E/A ratio:  0.90 Landscape architect signed by Phineas Inches Signature Date/Time: 04/27/2022/11:45:12 AM    Final    DG Chest Port 1 View  Result Date:  04/26/2022 CLINICAL DATA:  Difficulty breathing EXAM: PORTABLE CHEST 1 VIEW COMPARISON:  Previous studies including the radiograph done on 04/16/2022 and CT done on 04/19/2022 FINDINGS: Transverse diameter of heart is increased. Central pulmonary vessels are less prominent. There are linear densities in left mid and both lower lung fields. There are no new focal infiltrates. Tip of right IJ central venous catheter is seen in right atrium. There is interval placement of enteric tube. Distal portion of the tube is not included  in the image. IMPRESSION: Cardiomegaly. There are linear densities in left mid and both lower lung fields suggesting subsegmental atelectasis. There is interval decrease in pulmonary vascular congestion. No new focal infiltrates are seen. Electronically Signed   By: Elmer Picker M.D.   On: 04/26/2022 15:35   US Abdomen Limited  Result Date: 04/21/2022 CLINICAL DATA:  Dilated bile duct EXAM: ULTRASOUND ABDOMEN LIMITED RIGHT UPPER QUADRANT COMPARISON:  CT abdomen/pelvis 04/19/2022, right upper quadrant ultrasound 04/10/2022 FINDINGS: Gallbladder: Surgically absent. Common bile duct: Diameter: 7 mm, within normal limits for age and given history of cholecystectomy. Previously measured 10 mm. There is no intrahepatic biliary ductal dilatation. Liver: Parenchymal echogenicity is diffusely increased likely reflecting fatty infiltration. No focal lesions are seen. Portal vein is patent on color Doppler imaging with normal direction of blood flow towards the liver. Other: None. IMPRESSION: 1. Common bile duct measures 7 mm which is within expected limits given age and history of cholecystectomy, decreased from 10 mm on the prior study. No intrahepatic biliary ductal dilatation. 2. Probable fatty infiltration of the liver, unchanged. Electronically Signed   By: Valetta Mole M.D.   On: 04/21/2022 08:26   CT CHEST ABDOMEN PELVIS W CONTRAST  Result Date: 04/19/2022 CLINICAL DATA:  Sepsis,  evaluate for source of infection. Pancreatitis EXAM: CT CHEST, ABDOMEN, AND PELVIS WITH CONTRAST TECHNIQUE: Multidetector CT imaging of the chest, abdomen and pelvis was performed following the standard protocol during bolus administration of intravenous contrast. RADIATION DOSE REDUCTION: This exam was performed according to the departmental dose-optimization program which includes automated exposure control, adjustment of the mA and/or kV according to patient size and/or use of iterative reconstruction technique. CONTRAST:  171m OMNIPAQUE IOHEXOL 300 MG/ML  SOLN COMPARISON:  CT 04/10/2022 FINDINGS: CT CHEST FINDINGS Cardiovascular: Central venous line with tip in the distal SVC. No pericardial effusion. No acute cardio vascular findings Mediastinum/Nodes: No mediastinal adenopathy. Trachea normal. Mild thickening of the esophagus. Feeding tube extends through the esophagus into the stomach. Lungs/Pleura: Mild basilar atelectasis at the RIGHT base. Cavitary lesion in the anterior LEFT upper lobe measuring 7.2 cm by 3.4 cm. This cavitary lesion is thick-walled similar to CT 03/12/2022. Musculoskeletal: No aggressive osseous lesion. CT ABDOMEN AND PELVIS FINDINGS Hepatobiliary: Postcholecystectomy. No intrahepatic biliary duct dilatation. Enhancing lesion in the liver. Pancreas: Again demonstrated extensive fluid along the body and tail the pancreas. The pancreas is edematous. Pancreas does enhance uniformly. There is no evidence of pancreatic ductal interruption. No organized fluid collections. Degree of pancreatic inflammation and fluid is increased from 04/10/2022. No vascular complication identified associated with the acute pancreatitis. Spleen: Normal spleen Adrenals/urinary tract: Adrenal glands and kidneys are normal. The ureters and bladder normal. Stomach/Bowel: Feeding tube extends into the fourth portion the duodenum. No small bowel obstruction. No pneumatosis or portal venous gas. Colon is normal.  Vascular/Lymphatic: Abdominal aorta is normal caliber. There is no retroperitoneal or periportal lymphadenopathy. No pelvic lymphadenopathy. Reproductive: Unremarkable. Extensive streak artifact from the bowel hip prosthetics Other: No abdominal abscess Musculoskeletal: No aggressive osseous lesion. IMPRESSION: Chest Impression: 1. Mild RIGHT basilar atelectasis is new from comparison CT. 2. Stable thick-walled cavitary lesion in the LEFT upper lobe. 3. Mild thickening of the esophagus. Abdomen / Pelvis Impression: 1. Increase in fluid surrounding the pancreas compared to most recent CT. Findings consistent with acute pancreatitis. No organized fluid collections at this point. Pancreas is edematous but no evidence of necrotizing pancreatitis as the pancreas enhances. 2. Feeding tube extends in the fourth portion duodenum. 3.  No evidence of bowel obstruction or bowel ischemia. Electronically Signed   By: Suzy Bouchard M.D.   On: 04/19/2022 14:46   CT HEAD WO CONTRAST (5MM)  Result Date: 04/19/2022 CLINICAL DATA:  Altered mental status. EXAM: CT HEAD WITHOUT CONTRAST TECHNIQUE: Contiguous axial images were obtained from the base of the skull through the vertex without intravenous contrast. RADIATION DOSE REDUCTION: This exam was performed according to the departmental dose-optimization program which includes automated exposure control, adjustment of the mA and/or kV according to patient size and/or use of iterative reconstruction technique. COMPARISON:  None Available. FINDINGS: Brain: No evidence of acute infarction, hemorrhage, hydrocephalus, extra-axial collection or mass lesion/mass effect. Vascular: No hyperdense vessel or unexpected calcification. Skull: Normal. Negative for fracture or focal lesion. Sinuses/Orbits: No acute finding. Other: None. IMPRESSION: No acute intracranial abnormality seen. Electronically Signed   By: Marijo Conception M.D.   On: 04/19/2022 14:34   DG Abd Portable 1V  Result  Date: 04/17/2022 CLINICAL DATA:  Abdominal distension EXAM: PORTABLE ABDOMEN - 1 VIEW COMPARISON:  April 16, 2022 FINDINGS: The feeding tube terminates in the distal third portion of the duodenum, stable. The bowel gas pattern is nonobstructive. No other acute abnormalities. IMPRESSION: The feeding tube is in stable position terminating in the distal third portion of the duodenum. No cause for distention identified. Bowel gas pattern is nonobstructive. Electronically Signed   By: Dorise Bullion III M.D.   On: 04/17/2022 10:04   DG Abd Portable 1V  Result Date: 04/16/2022 CLINICAL DATA:  NG tube placement. EXAM: PORTABLE ABDOMEN - 1 VIEW COMPARISON:  04/11/2022.  CT, 04/10/2022. FINDINGS: Enteric feeding tube has been placed, extending through the stomach, tip projecting just distal to the ligament of Treitz. IMPRESSION: 1. Well-positioned enteric feeding tube. Electronically Signed   By: Lajean Manes M.D.   On: 04/16/2022 15:42   DG Chest 1 View  Result Date: 04/16/2022 CLINICAL DATA:  Encounter for respiratory failure EXAM: CHEST  1 VIEW COMPARISON:  Three days ago FINDINGS: Central line on the right with tip at the right atrium. Low volume chest with streaky and indistinct opacity about the hila and bases. Biapical bullous changes. No visible effusion or pneumothorax. Cardiomegaly. IMPRESSION: Stable low volume chest and pulmonary opacities. Electronically Signed   By: Jorje Guild M.D.   On: 04/16/2022 05:13   DG Chest Port 1 View  Result Date: 04/13/2022 CLINICAL DATA:  [id number]; central line placement EXAM: PORTABLE CHEST 1 VIEW COMPARISON:  Study performed on the same day earlier at 5:35 a.m. FINDINGS: There has been interval right transjugular central venous catheter placement with its tip in the proximal right atrium. No pneumothorax. Mild cardiomegaly, stable. There is pulmonary vascular congestion with superimposed patchy opacities in the left mid to-lower lung and to a lesser extent right  lung base of likely atelectasis. Continued elevation of the right hemidiaphragm. The visualized skeletal structures are unremarkable. IMPRESSION: 1. There has been interval insertion of right transjugular central venous catheter with its tip in the proximal right atrium. No pneumothorax. 2.  Mild cardiomegaly, Stable. 3. Pulmonary vascular congestion with patchy opacities likely atelectasis greater on the left lung, stable. Electronically Signed   By: Frazier Richards M.D.   On: 04/13/2022 10:08   DG Chest Port 1 View  Result Date: 04/13/2022 CLINICAL DATA:  61 year old male with congestive heart failure. EXAM: PORTABLE CHEST 1 VIEW COMPARISON:  Portable chest 04/12/2022 and earlier. Including recent high-resolution chest CT 03/12/2022. FINDINGS: Portable AP semi upright view  at 0535 hours. Lower lung volumes. Elevation of the right hemidiaphragm is chronic and not significantly changed. Coarse bilateral pulmonary opacity appears stable from the CT last month, with underlying bullous paraseptal emphysema and bullae wall thickening greater on the left. No pneumothorax, pulmonary edema, pleural effusion or areas of worsening ventilation. Paucity of bowel gas in the upper abdomen. No acute osseous abnormality identified. IMPRESSION: Ongoing coarse bilateral pulmonary opacity felt related to the abnormal bullous emphysema with possible super infection demonstrated by CT last month (please see that report). No significant improvement. Electronically Signed   By: Genevie Ann M.D.   On: 04/13/2022 05:58   DG Chest Port 1 View  Result Date: 04/12/2022 CLINICAL DATA:  Shortness of breath EXAM: PORTABLE CHEST 1 VIEW COMPARISON:  04/10/2022 FINDINGS: Mildly increased opacities bilaterally likely pulmonary edema. Left midlung opacity is unchanged. Mild cardiomegaly. No pleural effusion. IMPRESSION: Cardiomegaly and mild pulmonary edema. Unchanged left midlung opacity. Electronically Signed   By: Ulyses Jarred M.D.   On:  04/12/2022 19:47   DG Abd Portable 1V  Result Date: 04/11/2022 CLINICAL DATA:  Abdominal distension. EXAM: PORTABLE ABDOMEN - 1 VIEW COMPARISON:  04/10/2022 FINDINGS: Exam detail diminished secondary to body habitus. Possibly a bowel gas. No dilated loops of bowel noted. Status post bilateral hip arthroplasty. Left groin central venous catheter is identified with tip projecting over the left sacrum. IMPRESSION: 1. Limited exam secondary body habitus and portable technique. 2. Paucity of bowel gas.  No dilated bowel loops noted. 3. Left groin central venous catheter tip projects over the left sacrum. Electronically Signed   By: Kerby Moors M.D.   On: 04/11/2022 11:30   DG CHEST PORT 1 VIEW  Result Date: 04/10/2022 CLINICAL DATA:  10027 tachypnea EXAM: PORTABLE CHEST 1 VIEW COMPARISON:  Radiograph dated April 10, 2022, CT dated March 12, 2022 FINDINGS: The cardiomediastinal silhouette is unchanged in contour.Unchanged elevation of the RIGHT hemidiaphragm. Bullous changes bilaterally. Similar appearance of RIGHT-sided pleural effusion versus pleural thickening. No pneumothorax. Revisualization of a rounded opacity of the LEFT upper lung with a coarse rind which corresponds to bulla with adjacent inflammation on prior CT. Visualized abdomen is unremarkable. IMPRESSION: Similar appearance of an opacity of the LEFT lung corresponding to a possibly inflamed bulla on prior chest CT dated March 12, 2022. Electronically Signed   By: Valentino Saxon M.D.   On: 04/10/2022 17:08   US Abdomen Limited RUQ (LIVER/GB)  Result Date: 04/10/2022 CLINICAL DATA:  Pain. Pancreatitis diagnosed on the CT scan from earlier today. EXAM: ULTRASOUND ABDOMEN LIMITED RIGHT UPPER QUADRANT COMPARISON:  Abdominal ultrasound February 26, 2022. CT scan of the abdomen and pelvis April 10, 2022. FINDINGS: Gallbladder: The gallbladder is surgically absent. Common bile duct: Diameter: 10.8 mm today versus 3 mm on the comparison ultrasound. Liver:  Mild diffuse increased echogenicity in the liver. No liver mass identified. Portal vein is patent on color Doppler imaging with normal direction of blood flow towards the liver. Other: None. IMPRESSION: 1. The common bile duct measures 10.8 mm today versus 3 mm on February 26, 2022. The patient is status post cholecystectomy March 23, 2021. Recommend correlation with LFTs. If there is concern for common bile duct dilatation, recommend MRCP. 2. The gallbladder is surgically absent. 3. Probable hepatic steatosis with increased echogenicity in the liver. Electronically Signed   By: Dorise Bullion III M.D.   On: 04/10/2022 09:58   CT ABDOMEN PELVIS WO CONTRAST  Result Date: 04/10/2022 CLINICAL DATA:  Abdominal pain EXAM:  CT ABDOMEN AND PELVIS WITHOUT CONTRAST TECHNIQUE: Multidetector CT imaging of the abdomen and pelvis was performed following the standard protocol without IV contrast. RADIATION DOSE REDUCTION: This exam was performed according to the departmental dose-optimization program which includes automated exposure control, adjustment of the mA and/or kV according to patient size and/or use of iterative reconstruction technique. COMPARISON:  None Available. FINDINGS: Lower chest: No acute abnormality. Hepatobiliary: No focal liver abnormality is seen. Status post cholecystectomy. No biliary dilatation. Pancreas: Signs of acute pancreatitis identified. There is marked diffuse pancreatic edema with peripancreatic fat stranding and fluid. No main duct dilatation or signs early pseudocyst formation. Spleen: Normal in size without focal abnormality. Adrenals/Urinary Tract: Adrenal glands are unremarkable. Kidneys are normal, without renal calculi, focal lesion, or hydronephrosis. Bladder is unremarkable. Stomach/Bowel: Stomach is normal. The appendix is visualized and appears normal. No bowel wall thickening, inflammation, or distension. Vascular/Lymphatic: Aortic atherosclerosis. No abdominopelvic adenopathy.  Reproductive: Prostate is unremarkable. Other: No discrete fluid collections identified. No signs of pneumoperitoneum. Musculoskeletal: Status post bilateral hip arthroplasty. No acute or suspicious osseous findings. IMPRESSION: 1. Imaging findings compatible with acute pancreatitis. 2. No signs of main duct dilatation or early pseudocyst formation. 3. Status post cholecystectomy. Electronically Signed   By: Kerby Moors M.D.   On: 04/10/2022 05:25   DG Chest Port 1 View  Result Date: 04/10/2022 CLINICAL DATA:  Hypoxia EXAM: PORTABLE CHEST 1 VIEW COMPARISON:  03/25/2021 FINDINGS: Opacity in the left upper lobe with central lucency. Chronic interstitial coarsening. Normal heart size and mediastinal contours for technique. Extrapleural thickening bilaterally, likely extrapleural fat based on most recent CT. IMPRESSION: Thick walled bulla in the left upper lobe which are known from 03/12/2022 chest CT. No new abnormality. Electronically Signed   By: Jorje Guild M.D.   On: 04/10/2022 04:20    Labs:  CBC: Recent Labs    04/26/22 0356 04/30/22 0840 05/01/22 0600 05/02/22 0051  WBC 22.2* 16.6* 15.4* 16.2*  HGB 8.3* 6.5* 7.7* 8.2*  HCT 27.6* 21.8* 24.2* 26.7*  PLT 313 251 230 275    COAGS: Recent Labs    07/02/21 1255 04/14/22 0225 04/15/22 0351  INR 1.0  --   --   APTT  --  26 46*    BMP: Recent Labs    04/30/22 2114 05/01/22 0600 05/01/22 0946 05/02/22 0045  NA 132* 132* 133* 133*  K 3.9 4.0 4.0 4.5  CL 91* 91* 91* 92*  CO2 '24 24 24 '$ 20*  GLUCOSE 178* 189* 223* 241*  BUN 31* 41* 40* 58*  CALCIUM 9.6 9.7 9.7 9.7  CREATININE 5.65* 6.77* 7.24* 8.72*  GFRNONAA 11* 9* 8* 6*    LIVER FUNCTION TESTS: Recent Labs    03/16/22 1050 04/10/22 0405 04/13/22 0456 04/17/22 0053 04/17/22 1650 04/20/22 1127 04/20/22 1627 04/28/22 0338 04/28/22 1550 04/29/22 0332 05/02/22 0045  BILITOT 0.4 0.9  --  0.8  --  0.6  --   --   --   --   --   AST 19 26  --  30  --  35  --   --    --   --   --   ALT 14 19  --  17  --  20  --   --   --   --   --   ALKPHOS 103 99  --  98  --  103  --   --   --   --   --   PROT 9.2*  9.4*  --  7.5  --  8.0  --   --   --   --   --   ALBUMIN 4.9 3.9   < > 2.0*  2.0*   < > 2.0*   < > 2.4* 2.4* 2.2* 2.6*   < > = values in this interval not displayed.    TUMOR MARKERS: No results for input(s): "AFPTM", "CEA", "CA199", "CHROMGRNA" in the last 8760 hours.  Assessment and Plan: End stage renal disease Patient with history of renal dysfunction admitted with abdominal pain, pancreatitis, DKA, shock leading to decreased UOP and likely progression to ESRD.  Followed by Nephrology.  Slight elevation in his WBC. Remains on pressors.  Patient was placed on CRRT during admission, but has transitioned to iHD. He does not currently have access due to line holiday.  IR consulted for tunneled catheter placement at the request of Dr. Joelyn Oms.   Patient agreeable to procedure, but also not able to remember he has had dialysis or demonstrate understanding of need for ongoing dialysis.  Critical illness has been overwhelming for him.  Anticipate placement of tunneled catheter sometime this week provided no worsening of his infection markers. Would also benefit for re-discussion with medical team re: need for HD.  IR following.   Thank you for this interesting consult.  I greatly enjoyed meeting TYCHO CHERAMIE and look forward to participating in their care.  A copy of this report was sent to the requesting provider on this date.  Electronically Signed: Docia Barrier, PA 05/02/2022, 2:34 PM   I spent a total of 20 Minutes    in face to face in clinical consultation, greater than 50% of which was counseling/coordinating care for renal dysfunction.

## 2022-05-03 DIAGNOSIS — R739 Hyperglycemia, unspecified: Secondary | ICD-10-CM

## 2022-05-03 DIAGNOSIS — J189 Pneumonia, unspecified organism: Secondary | ICD-10-CM | POA: Diagnosis not present

## 2022-05-03 DIAGNOSIS — N179 Acute kidney failure, unspecified: Secondary | ICD-10-CM | POA: Diagnosis not present

## 2022-05-03 DIAGNOSIS — J9601 Acute respiratory failure with hypoxia: Secondary | ICD-10-CM | POA: Diagnosis not present

## 2022-05-03 LAB — GLUCOSE, CAPILLARY
Glucose-Capillary: 105 mg/dL — ABNORMAL HIGH (ref 70–99)
Glucose-Capillary: 142 mg/dL — ABNORMAL HIGH (ref 70–99)
Glucose-Capillary: 144 mg/dL — ABNORMAL HIGH (ref 70–99)
Glucose-Capillary: 145 mg/dL — ABNORMAL HIGH (ref 70–99)
Glucose-Capillary: 191 mg/dL — ABNORMAL HIGH (ref 70–99)

## 2022-05-03 LAB — CBC
HCT: 23.9 % — ABNORMAL LOW (ref 39.0–52.0)
Hemoglobin: 7.4 g/dL — ABNORMAL LOW (ref 13.0–17.0)
MCH: 26.7 pg (ref 26.0–34.0)
MCHC: 31 g/dL (ref 30.0–36.0)
MCV: 86.3 fL (ref 80.0–100.0)
Platelets: 240 10*3/uL (ref 150–400)
RBC: 2.77 MIL/uL — ABNORMAL LOW (ref 4.22–5.81)
RDW: 17.2 % — ABNORMAL HIGH (ref 11.5–15.5)
WBC: 14.1 10*3/uL — ABNORMAL HIGH (ref 4.0–10.5)
nRBC: 0.4 % — ABNORMAL HIGH (ref 0.0–0.2)

## 2022-05-03 LAB — BASIC METABOLIC PANEL
Anion gap: 21 — ABNORMAL HIGH (ref 5–15)
BUN: 81 mg/dL — ABNORMAL HIGH (ref 8–23)
CO2: 22 mmol/L (ref 22–32)
Calcium: 10.1 mg/dL (ref 8.9–10.3)
Chloride: 92 mmol/L — ABNORMAL LOW (ref 98–111)
Creatinine, Ser: 11.3 mg/dL — ABNORMAL HIGH (ref 0.61–1.24)
GFR, Estimated: 5 mL/min — ABNORMAL LOW (ref >60)
Glucose, Bld: 150 mg/dL — ABNORMAL HIGH (ref 70–99)
Potassium: 5.1 mmol/L (ref 3.5–5.1)
Sodium: 135 mmol/L (ref 135–145)

## 2022-05-03 LAB — MAGNESIUM: Magnesium: 2.4 mg/dL (ref 1.7–2.4)

## 2022-05-03 MED ORDER — SODIUM ZIRCONIUM CYCLOSILICATE 10 G PO PACK
10.0000 g | PACK | Freq: Once | ORAL | Status: AC
  Administered 2022-05-03: 10 g via ORAL
  Filled 2022-05-03: qty 1

## 2022-05-03 MED ORDER — REVEFENACIN 175 MCG/3ML IN SOLN
175.0000 ug | Freq: Every day | RESPIRATORY_TRACT | Status: DC
  Administered 2022-05-03 – 2022-05-14 (×10): 175 ug via RESPIRATORY_TRACT
  Filled 2022-05-03 (×11): qty 3

## 2022-05-03 MED ORDER — SODIUM BICARBONATE 650 MG PO TABS
650.0000 mg | ORAL_TABLET | Freq: Once | ORAL | Status: AC
  Administered 2022-05-03: 650 mg
  Filled 2022-05-03: qty 1

## 2022-05-03 MED ORDER — NA FERRIC GLUC CPLX IN SUCROSE 12.5 MG/ML IV SOLN
250.0000 mg | Freq: Every day | INTRAVENOUS | Status: AC
  Administered 2022-05-03 – 2022-05-04 (×2): 250 mg via INTRAVENOUS
  Filled 2022-05-03 (×2): qty 20

## 2022-05-03 MED ORDER — HEPARIN SODIUM (PORCINE) 1000 UNIT/ML IJ SOLN
INTRAMUSCULAR | Status: AC
  Filled 2022-05-03: qty 4

## 2022-05-03 MED ORDER — VITAL 1.5 CAL PO LIQD
1000.0000 mL | ORAL | Status: DC
  Administered 2022-05-04: 1000 mL
  Filled 2022-05-03: qty 1000

## 2022-05-03 MED ORDER — PANCRELIPASE (LIP-PROT-AMYL) 10440-39150 UNITS PO TABS
20880.0000 [IU] | ORAL_TABLET | Freq: Once | ORAL | Status: AC
  Administered 2022-05-03: 20880 [IU]
  Filled 2022-05-03: qty 2

## 2022-05-03 MED ORDER — ALBUMIN HUMAN 25 % IV SOLN
INTRAVENOUS | Status: AC
  Filled 2022-05-03: qty 100

## 2022-05-03 MED ORDER — PROSOURCE TF20 ENFIT COMPATIBL EN LIQD: 60.0000 mL | Freq: Every day | ENTERAL | Status: DC

## 2022-05-03 MED ORDER — NEPRO/CARBSTEADY PO LIQD
237.0000 mL | Freq: Three times a day (TID) | ORAL | Status: DC
  Administered 2022-05-05 – 2022-05-08 (×8): 237 mL via ORAL

## 2022-05-03 MED ORDER — INSULIN DETEMIR 100 UNIT/ML ~~LOC~~ SOLN
45.0000 [IU] | Freq: Two times a day (BID) | SUBCUTANEOUS | Status: DC
  Administered 2022-05-03: 45 [IU] via SUBCUTANEOUS
  Filled 2022-05-03 (×3): qty 0.45

## 2022-05-03 MED ORDER — SODIUM ZIRCONIUM CYCLOSILICATE 10 G PO PACK
10.0000 g | PACK | Freq: Once | ORAL | Status: DC
Start: 2022-05-03 — End: 2022-05-03

## 2022-05-03 MED ORDER — CEFAZOLIN SODIUM-DEXTROSE 2-4 GM/100ML-% IV SOLN
2.0000 g | INTRAVENOUS | Status: AC
  Filled 2022-05-03: qty 100

## 2022-05-03 MED ORDER — INSULIN DETEMIR 100 UNIT/ML ~~LOC~~ SOLN
22.0000 [IU] | Freq: Once | SUBCUTANEOUS | Status: AC
  Administered 2022-05-03: 22 [IU] via SUBCUTANEOUS
  Filled 2022-05-03: qty 0.22

## 2022-05-03 MED ORDER — VITAL 1.5 CAL PO LIQD: 1000.0000 mL | ORAL | Status: DC

## 2022-05-03 MED ORDER — DARBEPOETIN ALFA 60 MCG/0.3ML IJ SOSY
60.0000 ug | PREFILLED_SYRINGE | INTRAMUSCULAR | Status: DC
  Administered 2022-05-03: 60 ug via INTRAVENOUS
  Filled 2022-05-03 (×2): qty 0.3

## 2022-05-03 MED ORDER — BOOST / RESOURCE BREEZE PO LIQD CUSTOM: 1.0000 | Freq: Three times a day (TID) | ORAL | Status: DC

## 2022-05-03 NOTE — Progress Notes (Signed)
Cortrak clogged. Clogged feeding tube protocol implemented with two unsuccessful attempts to unclog. MD notified and cortrak consult placed.

## 2022-05-03 NOTE — Progress Notes (Signed)
Pt placed on BIPAP for night rest.  Tolerating well. 

## 2022-05-03 NOTE — Progress Notes (Signed)
[facility] NEPHROLOGY PROGRESS NOTE  Assessment/ Plan:  # Anuric dialysis dependent AKI/CKD stage IIIb-IV - presumably ischemic ATN in setting of acute pancreatitis, hypotension, pressor support, with concomitant ARB, SGLT-2 inhibitor and finerenone.  Initially non-oliguric but now anuric.  -Continue to hold losartan, Jardiance, and Kerendia.   -Started on CRRT 04/13/22 - 04/25/22.  Started iHD 8/21, tolerating on MWF schedule -IR following for [facility] today. -plan for HD today.    # Acute pancreatitis: resolved   # Shock -on off pressors per PCCM; on very high dose of midodrine.  #Acute hypoxic respiratory failure - with underlying COPD and OSA.  Currently on high flow oxygen via nasal cannula.  #Hep C with advanced liver fibrosis.  #Chronic diastolic CHF - stable.  #Anemia; transfuse prn.  Iron saturation 12 with high ferritin because of inflammatory state.  I will order IV iron and start ESA.  Subjective: Seen and examined at the bedside.  Plan for IR procedure today to place tunneled HD catheter.  No urine output. Objective Vital signs in last 24 hours: Vitals:   05/03/22 0730 05/03/22 0737 05/03/22 0745 05/03/22 0803  BP: 121/87   122/84  Pulse: (!) 111  (!) 108   Resp: (!) 21     Temp:  98.1 F (36.7 C)    TempSrc:  Oral    SpO2: 99%  98%   Weight:      Height:       Weight change: 1.1 kg  Intake/Output Summary (Last 24 hours) at 05/03/2022 0850 Last data filed at 05/03/2022 0000 Gross per 24 hour  Intake 1257.47 ml  Output 200 ml  Net 1057.47 ml       Labs: RENAL PANEL Recent Labs    04/24/22 0547 04/24/22 1551 04/25/22 0255 04/25/22 0534 04/26/22 0356 04/26/22 1816 04/27/22 0516 04/27/22 1536 04/28/22 0338 04/28/22 1550 04/29/22 0332 04/29/22 1515 04/29/22 2141 04/30/22 0317 04/30/22 1255 04/30/22 1725 04/30/22 2114 05/01/22 0600 05/01/22 0946 05/02/22 0045 05/02/22 0051 05/03/22 0111  NA 135 136 134*   < > 133* 132* 131*  134* 133* 131* 128* 133* 133* 131* 134* 135 132* 132* 133* 133*  --   --   K 4.3 4.6 4.7   < > 5.3* 3.9 4.4 4.5 4.7 4.1 4.4 4.3 4.3 4.1 3.4* 3.8 3.9 4.0 4.0 4.5  --   --   CL 100 99 98  --  97* 93* 94* 95* 95* 91* 89* 93* 91* 91* 91* 91* 91* 91* 91* 92*  --   --   CO2 21* 25 24  --  20* '26 26 25 23 24 24 26 24 24 27 28 24 24 24 '$ 20*  --   --   GLUCOSE 190* 228* 212*  --  179* 173* 197* 257* 290* 305* 417* 254* 222* 235* 155* 202* 178* 189* 223* 241*  --   --   BUN 31* 29* 33*  --  44* 26* 36* 46* 58* 33* 42* 52* 57* 60* 22 27* 31* 41* 40* 58*  --   --   CREATININE 3.33* 3.06* 3.86*  --  5.40* 3.94* 5.69* 6.89* 8.60* 5.69* 7.13* 8.40* 9.08* 9.21* 4.25* 5.32* 5.65* 6.77* 7.24* 8.72*  --   --   CALCIUM 9.6 9.6 9.6  --  9.7 8.9 8.9 9.4 9.4 9.0 9.0 9.5 9.3 9.4 9.2 9.6 9.6 9.7 9.7 9.7  --   --   MG 2.6*  --  2.9*  --  2.8*  --  2.3  --  2.6*  --  2.1  --   --  2.4  --   --   --  1.9  --   --  2.2 2.4  PHOS 3.4 2.9 3.8  --  4.4 3.1 5.5* 6.2* 6.6* 4.9* 5.3*  --   --   --   --   --   --   --   --  6.3*  --   --   ALBUMIN 2.3* 2.3* 2.3*  --  2.3* 2.3* 2.3* 2.3* 2.4* 2.4* 2.2*  --   --   --   --   --   --   --   --  2.6*  --   --    < > = values in this interval not displayed.     Liver Function Tests: Recent Labs  Lab 04/28/22 1550 04/29/22 0332 05/02/22 0045  ALBUMIN 2.4* 2.2* 2.6*   No results for input(s): "LIPASE", "AMYLASE" in the last 168 hours. No results for input(s): "AMMONIA" in the last 168 hours. CBC: Recent Labs    04/26/22 0356 04/30/22 0840 05/01/22 0600 05/01/22 0946 05/02/22 0051 05/03/22 0111  HGB 8.3* 6.5* 7.7*  --  8.2* 7.4*  MCV 89.0 89.7 84.3  --  87.0 86.3  FERRITIN  --   --  1,149*  --   --   --   TIBC  --   --   --  284  --   --   IRON  --   --   --  35*  --   --     Cardiac Enzymes: No results for input(s): "CKTOTAL", "CKMB", "CKMBINDEX", "TROPONINI" in the last 168 hours. CBG: Recent Labs  Lab 05/02/22 1517 05/02/22 1931 05/02/22 2328 05/03/22 0337  05/03/22 0748  GLUCAP 173* 261* 170* 105* 145*    Iron Studies:  Recent Labs    05/01/22 0600 05/01/22 0946  IRON  --  35*  TIBC  --  284  FERRITIN 1,149*  --    Studies/Results: DG Chest Port 1 View  Result Date: 05/02/2022 CLINICAL DATA:  [id number].  Pneumonia follow-up. EXAM: PORTABLE CHEST 1 VIEW COMPARISON:  Portable chest August 24, chest CT April 19, 2022. FINDINGS: 5:07 a.m. Feeding tube enters the stomach but the intragastric course was not filmed. Interval removal right IJ double-lumen catheter with no pneumothorax. The known thick-walled left upper lobe cavitary lesion is less conspicuous today and may even be smaller in size and less thickened. Upper lobe emphysematous changes and bullous disease on the right are unaltered. There is persistent basilar atelectatic change without convincing pneumonia. Mild elevation right hemidiaphragm. The sulci are sharp. Stable mediastinum with aortic tortuosity and ectasia. Cardiomegaly without CHF. IMPRESSION: Left upper lobe thick-walled cavitary lesion is less conspicuous today. Persistent basilar atelectatic bands.  Stable cardiomegaly. Electronically Signed   By: Telford Nab M.D.   On: 05/02/2022 07:32   DG Abd 1 View  Result Date: 05/01/2022 CLINICAL DATA:  Evaluate NG tube EXAM: ABDOMEN - 1 VIEW COMPARISON:  None Available. FINDINGS: The feeding tube with a weighted distal tip remains in the region of the ligament of Treitz, similar in the interval. No other changes. No new tubes identified. IMPRESSION: The feeding tube is in good position. A new tube is not visualized. No other abnormalities. Electronically Signed   By: Dorise Bullion III M.D.   On: 05/01/2022 11:58    Medications: Infusions:  sodium chloride Stopped (05/02/22 1645)   sodium  chloride      ceFAZolin (ANCEF) IV     dextrose     feeding supplement (VITAL 1.5 CAL) Stopped (05/03/22 0000)   norepinephrine (LEVOPHED) Adult infusion Stopped (05/02/22 2016)     Scheduled Medications:  amoxicillin-clavulanate  500 mg Per Tube Q12H   arformoterol  15 mcg Nebulization BID   atorvastatin  80 mg Per Tube Daily   budesonide (PULMICORT) nebulizer solution  0.5 mg Nebulization BID   Chlorhexidine Gluconate Cloth  6 each Topical Daily   feeding supplement  237 mL Per Tube TID BM   feeding supplement (PROSource TF20)  60 mL Per Tube BID   heparin  5,000 Units Subcutaneous Q8H   insulin aspart  1-3 Units Subcutaneous Q4H   insulin aspart  13 Units Subcutaneous Q4H   insulin detemir  45 Units Subcutaneous Q12H   midodrine  40 mg Per Tube Q8H   multivitamin  1 tablet Per Tube QHS   mouth rinse  15 mL Mouth Rinse 4 times per day   pantoprazole  40 mg Per Tube BID   QUEtiapine  50 mg Per Tube QHS   simethicone  80 mg Per Tube QID   sodium chloride flush  10-40 mL Intracatheter Q12H    have reviewed scheduled and prn medications.  Physical Exam: General: Lethargic male, lying on bed, not really answering the question. Heart:RRR, s1s2 nl Lungs:clear b/l, no crackle Abdomen:soft, Non-tender, non-distended Extremities:No edema Dialysis Access: None now.  Charish Schroepfer Prasad Nadalyn Deringer 05/03/2022,8:50 AM  LOS: 23 days

## 2022-05-03 NOTE — Progress Notes (Addendum)
Nutrition Follow-up  DOCUMENTATION CODES:   Not applicable  INTERVENTION:   Once cortrak tube unclogged or replaced:  Continue tube feeding via Cortrak:  Increase Vital 1.5 to goal rate of 70 ml/hr (1655m per day) Prosource TF20 60 ml daily via tube   TF regimen provides 2600kcal/day, 134g/day protein, 12833mfree water daily.  Renal MVI po daily.  Nepro Shake po TID, each supplement provides 425 kcal and 19 grams protein  Recommend liberal diet   NUTRITION DIAGNOSIS:   Increased nutrient needs related to acute illness (renal failure; new to HD) as evidenced by estimated needs. -Ongoing  GOAL:   Patient will meet greater than or equal to 90% of their needs -met   MONITOR:   Labs, Weight trends, TF tolerance, Skin, I & O's, Diet advancement  ASSESSMENT:   [age]o male admitted with DKA, acute pancreatitis, respiratory distress, shock, PNA. PMH includes alcohol abuse, anxiety, CKD 3, COPD, DM-2, HTN, HLD, hepatitis C, OSA, COVID (2020) & GERD.  -Pt received CRRT from 8/8-8/20- now on iHD  Pt previously tolerating tube feeds at goal rate via post-pyloric cortrak tube. Tube feeds currently on hold as tube is clogged and pt was NPO for planned tunneled HD cath today which has now been rescheduled for tomorrow. Plan is for cortrak service to attempt to unclog tube or replace tomorrow. Pt has remained on clear liquid diet since 8/11. RN reports pt with poor appetite and oral intake r/t frequent gas and constant hiccups; apparently this is improving. Pt did drink 1/3 of a Nepro supplement today. MD has advanced pt to a renal/carb modified diet until he becomes NPO at midnight. RD will add Nepro supplements. RD will increase tube feeds slightly as pt with weight loss since admission; will plan to resume feeds after HD cath placed tomorrow. Refeed labs stable. Insurance authorization pending for LTNIKE  Per chart, pt appears to be down ~30lbs since admission but appears weight  stable over the past week. Pt +5.3L on his I & Os.   Medications reviewed and include: augmentin, darbepoetin, heparin, insulin, rena-vit, protonix, ferric gluconate   Labs reviewed: K 5.1 wnl, BUN 81(H), creat 11.30(H), Mg 2.4 wnl P 6.3(H)- 8/27 Wbc- 14.1(H), Hgb 7.4(L), Hct 23.9(L) Cbgs- 142, 145, 105 x 24 hrs   Diet Order:   Diet Order             Diet renal/carb modified with fluid restriction Diet-HS Snack? Nothing; Fluid restriction: 1200 mL Fluid; Room service appropriate? Yes; Fluid consistency: Thin  Diet effective now                  EDUCATION NEEDS:   Not appropriate for education at this time  Skin:  Skin Assessment: Reviewed RN Assessment  Last BM:  8/28- type 7- [id number] rectal tube  Height:   Ht Readings from Last 1 Encounters:  04/10/22 _0  (1.727 m)    Weight:   Wt Readings from Last 1 Encounters:  05/03/22 120.7 kg    Ideal Body Weight:  70 kg  BMI:  Body mass index is 40.46 kg/m.  Estimated Nutritional Needs:   Kcal:  2500-2800kcal/day  Protein:  >125g/day  Fluid:  1 L + UOP  CasKoleen Distance, RD, LDN Please refer to [facility] and/or RD on-call/weekend/after hours pager

## 2022-05-03 NOTE — TOC Progression Note (Signed)
Transition of Care [facility]) - Initial/Assessment Note    Patient Details  Name: Mathew Lara MRN: [id number] Date of Birth: [birth year]  Transition of Care [facility]) CM/SW Contact:    Milinda Antis, LCSWA Phone Number: 05/03/2022, 2:35 PM  Clinical Narrative:                 CSW contacted Anderson Malta with [facility].  Insurance authorization for [facility] is pending.  TOC will continue to follow.     Barriers to Discharge: Continued Medical Work up   Patient Goals and CMS Choice        Expected Discharge Plan and Services                                                Prior Living Arrangements/Services                       Activities of Daily Living Home Assistive Devices/Equipment: Environmental consultant (specify type) ADL Screening (condition at time of admission) Patient's cognitive ability adequate to safely complete daily activities?: No Is the patient deaf or have difficulty hearing?: No Does the patient have difficulty seeing, even when wearing glasses/contacts?: No Does the patient have difficulty concentrating, remembering, or making decisions?: Yes Patient able to express need for assistance with ADLs?: Yes Does the patient have difficulty dressing or bathing?: No Independently performs ADLs?: Yes (appropriate for developmental age) Does the patient have difficulty walking or climbing stairs?: No Weakness of Legs: Both Weakness of Arms/Hands: None  Permission Sought/Granted                  Emotional Assessment              Admission diagnosis:  DKA (diabetic ketoacidosis) ([place]) [E11.10] Acute pancreatitis, unspecified complication status, unspecified pancreatitis type [K85.90] Patient Active Problem List   Diagnosis Date Noted   Cavitary pneumonia    Leukocytosis    Bile duct abnormality    Abnormal CT of the abdomen    Shock ([place])    DKA (diabetic ketoacidosis) ([place]) 04/10/2022   Acute pancreatitis 04/10/2022   SIRS (systemic inflammatory  response syndrome) ([place]) 04/10/2022   Hypertriglyceridemia 04/10/2022   Hypercholesterolemia 04/10/2022   Lactic acidosis 04/10/2022   Abdominal bloating 02/17/2022   Encounter for screening colonoscopy 06/17/2021   Fatty liver 03/05/2021   Chronic cholecystitis 02/24/2021   Exercise hypoxemia 06/03/2020   OSA on CPAP 06/03/2020   Healthcare maintenance 06/03/2020   ILD (interstitial lung disease) ([place]) 10/02/2019   GERD (gastroesophageal reflux disease) 10/02/2019   COPD with acute exacerbation ([place]) 09/28/2019   AKI on CKD stage 3b 09/19/2019   Acute respiratory failure with hypoxia ([place])    Chronic pain syndrome    History of COVID-19 08/29/2019   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   Acute renal failure ([place]) 03/26/2018   Rash 03/26/2018   Acute kidney failure, unspecified ([place]) 03/26/2018   Chest pain 03/08/2018   CKD (chronic kidney disease), stage III ([place]) 03/08/2018   COPD (chronic obstructive pulmonary disease) ([place]) 03/08/2018   Chronic kidney disease, stage 3 unspecified ([place]) 03/08/2018   Chronic obstructive pulmonary disease ([place]) 03/08/2018   Avascular necrosis of bone of left hip ([place]) 07/27/2017   Avascular necrosis of bone of right hip ([place]) 06/04/2016   Status post total replacement of right hip  06/04/2016   Avascular necrosis of bone of hip ([place]) 06/04/2016   Hepatitis C 03/21/2015   Viral hepatitis C 03/21/2015   History of alcohol abuse 11/08/2013   Former smoker 11/08/2013   Tobacco user 11/08/2013   Essential hypertension 11/07/2013   Abnormal ECG 11/07/2013   PCP:  [name], MD Pharmacy:   [place], [place] - [id number] [place] [address] [place] [address] Phone: [phone] Fax: [phone]     Social Determinants of Health (SDOH) Interventions    Readmission Risk Interventions     No data to display

## 2022-05-03 NOTE — Progress Notes (Signed)
Physical Therapy Treatment Patient Details Name: Mathew Lara MRN: [id number] DOB: May 28, 1961 Today's Date: 05/03/2022   History of Present Illness Pt is 61 yo male presenting to [facility] ED on 8/5 with abdominal pain. ED work-up revealed DKA, acute pancreatitis, AKI. CT 8/5 compatible with acute pancreatitis. Overnight 8/5 developed continuing respiratory failure and sock. Transfer to [facility]. Placed on BiPAP. Started on CRRT 04/13/22 - 04/25/22. Repeat CT abd pelvis 8/14 with edematous pancreas but no evidence of necrotizing pancreatitis. PMH including cholecystectomy (March 23, 2021), alcohol abuse, anxiety, CKD, COPD, diabetes mellitus type 2, essential hypertension, hepatitis C, mixed hyperlipidemia.    PT Comments    Pt making slower progress toward goals, but keeping his motivation/spirit up.  Emphasis on strengthening LE's, transition/scooting to EOB, sitting balance with "regrouping", sit tot standing, today in the RW with step pivot over to the recliner after 1-2 min of pregait warm up in the RW.    Recommendations for follow up therapy are one component of a multi-disciplinary discharge planning process, led by the attending physician.  Recommendations may be updated based on patient status, additional functional criteria and insurance authorization.  Follow Up Recommendations  Skilled nursing-short term rehab (<3 hours/day) Can patient physically be transported by private vehicle: No   Assistance Recommended at Discharge Frequent or constant Supervision/Assistance  Patient can return home with the following Assist for transportation;Assistance with cooking/housework;Help with stairs or ramp for entrance;A lot of help with walking and/or transfers;A lot of help with bathing/dressing/bathroom   Equipment Recommendations  Other (comment) (TBD post acute)    Recommendations for Other Services       Precautions / Restrictions Precautions Precautions: Fall Precaution  Comments: NG tube, flexseal,     Mobility  Bed Mobility Overal bed mobility: Needs Assistance Bed Mobility: Supine to Sit     Supine to sit: Mod assist, +2 for physical assistance, HOB elevated     General bed mobility comments: Lot of cuing to get some initiation.  Truncal and LE assist up via R elbow.    Transfers Overall transfer level: Needs assistance Equipment used: Rolling walker (2 wheels) Transfers: Sit to/from Stand, Bed to chair/wheelchair/BSC Sit to Stand: Mod assist, +2 physical assistance   Step pivot transfers: Mod assist, +2 physical assistance       General transfer comment: cues for hand placement, forward and boost assist    Ambulation/Gait                   Stairs             Wheelchair Mobility    Modified Rankin (Stroke Patients Only)       Balance   Sitting-balance support: Single extremity supported, Feet supported, No upper extremity supported Sitting balance-Leahy Scale: Fair Sitting balance - Comments: Again requiring UE support and min A at times; EOB 3-5 mins   Standing balance support: Bilateral upper extremity supported, During functional activity Standing balance-Leahy Scale: Poor Standing balance comment: reliant on AD and external support                            Cognition Arousal/Alertness: Awake/alert Behavior During Therapy: WFL for tasks assessed/performed Overall Cognitive Status:  (NT formally)  Exercises Other Exercises Other Exercises: hip/knee flexion/ext ROM with graded assist and resistance x10 reps.    General Comments        Pertinent Vitals/Pain Pain Assessment Pain Assessment: Faces Faces Pain Scale: No hurt Pain Intervention(s): Monitored during session, Other (comment) (Warm up prior to moving)    Home Living                          Prior Function            PT Goals (current goals can now be  found in the care plan section) Acute Rehab PT Goals PT Goal Formulation: With patient Time For Goal Achievement: 05/11/22 Potential to Achieve Goals: Fair Progress towards PT goals: Progressing toward goals    Frequency    Min 2X/week      PT Plan Current plan remains appropriate    Co-evaluation              AM-PAC PT "6 Clicks" Mobility   Outcome Measure  Help needed turning from your back to your side while in a flat bed without using bedrails?: A Lot Help needed moving from lying on your back to sitting on the side of a flat bed without using bedrails?: A Lot Help needed moving to and from a bed to a chair (including a wheelchair)?: Total Help needed standing up from a chair using your arms (e.g., wheelchair or bedside chair)?: A Lot Help needed to walk in hospital room?: Total Help needed climbing 3-5 steps with a railing? : Total 6 Click Score: 9    End of Session Equipment Utilized During Treatment: Oxygen Activity Tolerance: Patient tolerated treatment well;Patient limited by lethargy Patient left: in chair;with call bell/phone within reach;with chair alarm set Nurse Communication: Mobility status PT Visit Diagnosis: Other abnormalities of gait and mobility (R26.89);Muscle weakness (generalized) (M62.81)     Time: 3785-8850 PT Time Calculation (min) (ACUTE ONLY): 23 min  Charges:  $Therapeutic Exercise: 8-22 mins $Therapeutic Activity: 8-22 mins                     05/03/2022  Ginger Carne., PT Acute Rehabilitation Services [phone]  (pager) [phone]  (office)   [name] 05/03/2022, 4:28 PM

## 2022-05-03 NOTE — Progress Notes (Signed)
NAME:  Mathew Lara, MRN:  [id number], DOB:  December 28, 1960, LOS: 23 ADMISSION DATE:  04/10/2022, CONSULTATION DATE:  8/5 REFERRING MD:  Darrell Jewel, CHIEF COMPLAINT: Abdominal pain  History of Present Illness:  Mathew Lara is an 61 y.o. M who [facility]5 with a chief complaint of abdominal pain.  They have a past medical history of alcohol abuse, anxiety, CKD 3, COPD, OSA, type 2 diabetes, hypertension, hyperlipidemia, hep C+  ED work-up revealed DKA, acute pancreatitis, AKI.  He was admitted to the hospital service.  Overnight 8/5 he developed continuing respiratory distress, shock.   PCCM was consulted for transfer to [facility] for further management.   Pertinent  Medical History  Alcohol abuse, anxiety, CKD 3, COPD, OSA, type 2 diabetes, hypertension, hyperlipidemia, hep C  Significant Hospital Events: Including procedures, antibiotic start and stop dates in addition to other pertinent events   8/5 Presented to [facility] ED, transferred [facility] 8/7 On pressors  8/8 BiPAP, worsening renal parameters 8/9 Tolerating CRRT, on BiPAP 8/10 has been off BiPAP, on high flow oxygen and tolerating 8/11 UOP improving. CRRT with net even UF 8/14 CT reveals worsening cavitary pneumonia and acute pancreatitis, large BM 8/15 abx changed for cavitary pneumonia 8/16 ID consult.  ID stopped vancomycin, Zosyn, azithromycin >narrowed to augmentin. CRRT stopped.   8/17 CRRT restarted.  Off pressors 8/19 Remains off pressors, CRRT ongoing. HD cath required tPA.  8/21 iHD, off CRRT  Interim History / Subjective:  Afebrile overnight, weaned down from respiratory support.   Objective   Blood pressure 100/63, pulse (!) 113, temperature 98.1 F (36.7 C), temperature source Oral, resp. rate (!) 22, height '5\' 8"'$  (1.727 m), weight 120.7 kg, SpO2 95 %.      Intake/Output Summary (Last 24 hours) at 05/03/2022 1111 Last data filed at 05/03/2022 1104 Gross per 24 hour   Intake 1196.83 ml  Output 200 ml  Net 996.83 ml   Filed Weights   05/01/22 0500 05/02/22 0420 05/03/22 0500  Weight: 120 kg 119.6 kg 120.7 kg   Physical Exam: General: adult male lying in bed in NAD HEENT: MM pink/moist, anicteric  Neuro: AAOx3 (person, place, year), speech clear  CV: s1s2 RRR, NRMG PULM: non-labored at rest, CTABL GI: soft, normal BS, cortrak in place   Extremities: warm/dry, no edema, cap refill < 2 sec Skin: no rashes or lesions  Resolved Hospital Problem list   Multifactorial shock  Assessment & Plan:   Multifactorial Shock  Continued issues with low BP, likely vasoplegia in setting of chronic anemia, critical illness and renal failure. Continues to require levophed, but off since last night. -midodrine 40 mg TID -abx as below   ESRD HD MWF AKI on CKD 2/2 ATN in setting of acute pancreatitis and shock Patient to receive dialysis today, but needs access. IR to place [facility]. -F/u BMP -Lokelma x 1 -appreciate Nephrology - appreciate IR  -Trend BMP / urinary output -Replace electrolytes as indicated -Avoid nephrotoxic agents, ensure adequate renal perfusion, renal dose medications   DM II  DKA - resolved Glucose range 170-224's yesterday. Patient received 40 units long acting and 80 units short acting. CBG low at 105 this am 2/2 tube feeds stopped for [facility] procedure. Will decrease insulin dose, until TF restarted -Levimir 22 units x1, reconsider in evening, if no TF started -Continue Levimir 45 units BID, when TF restarted -Hold 13 units TF coverage, restart with TF -sSSI -CBG monitoring  -  AM BMP    LUL Cavitary Pneumonia Paraseptal Emphysema (Severe) -DLCO 38% pred - 02/12/20 Underlying OSA Hx post-infectious scarring - no ILD on CT Cavitary PNA on CT chest. Afebrile, with improved respiratory status, on HFNC 10 L this am. Patient continues to get BiPAP prn and qhs. WBC trending down.  -wean HFNC for sats 88-94% -pulmonary hygiene  -IS,  mobilize -brovana, pulmicort + PRN albuterol  -ID following, appreciate recs -augmentin (8/14-9/4) -QHS & PRN daytime sleep bipap   Acute on Chronic Anemia Patient Hgb drop stable range from 7-8 over weekend, 7.4 today -AM CBC -continue to monitor   Acute Metabolic Encephalopathy  Improved with BiPAP, suspect hypercarbic failure +/- hypoactive delirium. CT head neg. Alert and oriented x 3 today  (person/place/year), clear speech.  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  -diet Clear liquid w/ feeds at 60 cc/hr  -PRN tylenol  -atorvastatin   History of diastolic heart failure Echo 12/25/2019 revealed ejection fraction of 60 to 65%. Repeat echo showed normal EF with grade I diastolic dysfunction. Patient appears euvolemic on exam today.  -tele monitoring   Constipation - resolved Diarrhea Status post methylnaltrexone on 8/14 and smog enema. Patient with improved more formed stools. -bowel regimen  -TF at goal -minimize narcotics as able   Hiccups -gabapentin 100 mg TID PRN  Best Practice (right click and "Reselect all SmartList Selections" daily)  Diet/type: NPO w/ oral meds DVT prophylaxis: prophylactic heparin  GI prophylaxis: N/A Lines: N/A Foley:  Yes, and it is still needed Code Status:  full code Last date of multidisciplinary goals of care discussion: Patient updated 8/28.  Critical care time: 30 minutes   Holley Bouche, MD PGY-2 [facility] 05/03/2022, 11:11 AM   Please see [email or web address] for pager details.   From 7A-7P if no response, please call [phone] After hours, please call ELink [phone]

## 2022-05-04 ENCOUNTER — Inpatient Hospital Stay (HOSPITAL_COMMUNITY): Payer: Medicare Other

## 2022-05-04 DIAGNOSIS — J189 Pneumonia, unspecified organism: Secondary | ICD-10-CM | POA: Diagnosis not present

## 2022-05-04 DIAGNOSIS — J9601 Acute respiratory failure with hypoxia: Secondary | ICD-10-CM | POA: Diagnosis not present

## 2022-05-04 DIAGNOSIS — K859 Acute pancreatitis without necrosis or infection, unspecified: Secondary | ICD-10-CM | POA: Diagnosis not present

## 2022-05-04 DIAGNOSIS — N179 Acute kidney failure, unspecified: Secondary | ICD-10-CM | POA: Diagnosis not present

## 2022-05-04 HISTORY — PX: IR FLUORO GUIDE CV LINE LEFT: IMG2282

## 2022-05-04 HISTORY — PX: IR US GUIDE VASC ACCESS LEFT: IMG2389

## 2022-05-04 LAB — CBC
HCT: 21.4 % — ABNORMAL LOW (ref 39.0–52.0)
Hemoglobin: 6.7 g/dL — CL (ref 13.0–17.0)
MCH: 27 pg (ref 26.0–34.0)
MCHC: 31.3 g/dL (ref 30.0–36.0)
MCV: 86.3 fL (ref 80.0–100.0)
Platelets: 266 10*3/uL (ref 150–400)
RBC: 2.48 MIL/uL — ABNORMAL LOW (ref 4.22–5.81)
RDW: 17.3 % — ABNORMAL HIGH (ref 11.5–15.5)
WBC: 14.7 10*3/uL — ABNORMAL HIGH (ref 4.0–10.5)
nRBC: 0.1 % (ref 0.0–0.2)

## 2022-05-04 LAB — GLUCOSE, CAPILLARY
Glucose-Capillary: 122 mg/dL — ABNORMAL HIGH (ref 70–99)
Glucose-Capillary: 130 mg/dL — ABNORMAL HIGH (ref 70–99)
Glucose-Capillary: 138 mg/dL — ABNORMAL HIGH (ref 70–99)
Glucose-Capillary: 149 mg/dL — ABNORMAL HIGH (ref 70–99)
Glucose-Capillary: 154 mg/dL — ABNORMAL HIGH (ref 70–99)
Glucose-Capillary: 164 mg/dL — ABNORMAL HIGH (ref 70–99)
Glucose-Capillary: 196 mg/dL — ABNORMAL HIGH (ref 70–99)

## 2022-05-04 LAB — BASIC METABOLIC PANEL
Anion gap: 19 — ABNORMAL HIGH (ref 5–15)
BUN: 95 mg/dL — ABNORMAL HIGH (ref 8–23)
CO2: 22 mmol/L (ref 22–32)
Calcium: 9.8 mg/dL (ref 8.9–10.3)
Chloride: 93 mmol/L — ABNORMAL LOW (ref 98–111)
Creatinine, Ser: 12.73 mg/dL — ABNORMAL HIGH (ref 0.61–1.24)
GFR, Estimated: 4 mL/min — ABNORMAL LOW (ref >60)
Glucose, Bld: 132 mg/dL — ABNORMAL HIGH (ref 70–99)
Potassium: 5 mmol/L (ref 3.5–5.1)
Sodium: 134 mmol/L — ABNORMAL LOW (ref 135–145)

## 2022-05-04 LAB — MAGNESIUM: Magnesium: 2.4 mg/dL (ref 1.7–2.4)

## 2022-05-04 LAB — HEMOGLOBIN AND HEMATOCRIT, BLOOD
HCT: 23 % — ABNORMAL LOW (ref 39.0–52.0)
Hemoglobin: 7.3 g/dL — ABNORMAL LOW (ref 13.0–17.0)

## 2022-05-04 LAB — PREPARE RBC (CROSSMATCH)

## 2022-05-04 MED ORDER — PROSOURCE TF20 ENFIT COMPATIBL EN LIQD
70.0000 mL | Freq: Every day | ENTERAL | Status: DC
  Administered 2022-05-05 – 2022-05-11 (×3): 70 mL
  Filled 2022-05-04 (×4): qty 120

## 2022-05-04 MED ORDER — SODIUM ZIRCONIUM CYCLOSILICATE 10 G PO PACK
10.0000 g | PACK | Freq: Once | ORAL | Status: AC
  Administered 2022-05-04: 10 g via ORAL
  Filled 2022-05-04: qty 1

## 2022-05-04 MED ORDER — FENTANYL CITRATE (PF) 100 MCG/2ML IJ SOLN
INTRAMUSCULAR | Status: AC
  Filled 2022-05-04: qty 2

## 2022-05-04 MED ORDER — FENTANYL CITRATE (PF) 100 MCG/2ML IJ SOLN
INTRAMUSCULAR | Status: AC | PRN
  Administered 2022-05-04 (×2): 25 ug via INTRAVENOUS

## 2022-05-04 MED ORDER — INSULIN DETEMIR 100 UNIT/ML ~~LOC~~ SOLN
45.0000 [IU] | Freq: Two times a day (BID) | SUBCUTANEOUS | Status: DC
  Administered 2022-05-04 – 2022-05-06 (×4): 45 [IU] via SUBCUTANEOUS
  Filled 2022-05-04 (×5): qty 0.45

## 2022-05-04 MED ORDER — SODIUM CHLORIDE 0.9% IV SOLUTION: Freq: Once | INTRAVENOUS | Status: DC

## 2022-05-04 MED ORDER — MIDAZOLAM HCL 2 MG/2ML IJ SOLN
INTRAMUSCULAR | Status: AC
  Filled 2022-05-04: qty 2

## 2022-05-04 MED ORDER — MIDAZOLAM HCL 2 MG/2ML IJ SOLN
INTRAMUSCULAR | Status: AC | PRN
  Administered 2022-05-04: .5 mg via INTRAVENOUS
  Administered 2022-05-04: 1 mg via INTRAVENOUS

## 2022-05-04 MED ORDER — INSULIN DETEMIR 100 UNIT/ML ~~LOC~~ SOLN
22.0000 [IU] | Freq: Once | SUBCUTANEOUS | Status: AC
  Administered 2022-05-04: 22 [IU] via SUBCUTANEOUS
  Filled 2022-05-04: qty 0.22

## 2022-05-04 MED ORDER — CEFAZOLIN SODIUM-DEXTROSE 2-4 GM/100ML-% IV SOLN
INTRAVENOUS | Status: AC
  Administered 2022-05-04: 2 g via INTRAVENOUS
  Filled 2022-05-04: qty 100

## 2022-05-04 MED ORDER — LIDOCAINE HCL 1 % IJ SOLN
INTRAMUSCULAR | Status: AC
  Administered 2022-05-04: 20 mL
  Filled 2022-05-04: qty 20

## 2022-05-04 MED ORDER — HEPARIN SODIUM (PORCINE) 1000 UNIT/ML IJ SOLN
INTRAMUSCULAR | Status: AC
  Administered 2022-05-04: 2800 [IU]
  Filled 2022-05-04: qty 10

## 2022-05-04 NOTE — Progress Notes (Signed)
NAME:  Mathew Lara, MRN:  [id number], DOB:  1960/10/28, LOS: 24 ADMISSION DATE:  04/10/2022, CONSULTATION DATE:  8/5 REFERRING MD:  Darrell Jewel, CHIEF COMPLAINT: Abdominal pain  History of Present Illness:  Mathew Lara is an 61 y.o. M who [facility] on 8/5 with a chief complaint of abdominal pain.  They have a past medical history of alcohol abuse, anxiety, CKD 3, COPD, OSA, type 2 diabetes, hypertension, hyperlipidemia, hep C+  ED work-up revealed DKA, acute pancreatitis, AKI.  He was admitted to the hospital service.  Overnight 8/5 he developed continuing respiratory distress, shock.   PCCM was consulted for transfer to [facility] for further management.   Pertinent  Medical History  Alcohol abuse, anxiety, CKD 3, COPD, OSA, type 2 diabetes, hypertension, hyperlipidemia, hep C  Significant Hospital Events: Including procedures, antibiotic start and stop dates in addition to other pertinent events   8/5 Presented to [facility] ED, transferred [facility] 8/7 On pressors  8/8 BiPAP, worsening renal parameters 8/9 Tolerating CRRT, on BiPAP 8/10 has been off BiPAP, on high flow oxygen and tolerating 8/11 UOP improving. CRRT with net even UF 8/14 CT reveals worsening cavitary pneumonia and acute pancreatitis, large BM 8/15 abx changed for cavitary pneumonia 8/16 ID consult.  ID stopped vancomycin, Zosyn, azithromycin >narrowed to augmentin. CRRT stopped.   8/17 CRRT restarted.  Off pressors 8/19 Remains off pressors, CRRT ongoing. HD cath required tPA.  8/21 iHD, off CRRT  Interim History / Subjective:  Afebrile overnight, respiratory status continues to improve. Hgb drop below 7, transfused 1 unit.    Objective   Blood pressure 112/63, pulse (!) 111, temperature 98.2 F (36.8 C), temperature source Oral, resp. rate (!) 23, height '5\' 8"'$  (1.727 m), weight 123.3 kg, SpO2 96 %.      Intake/Output Summary (Last 24 hours) at 05/04/2022 1024 Last data  filed at 05/04/2022 0950 Gross per 24 hour  Intake 740.09 ml  Output 100 ml  Net 640.09 ml   Filed Weights   05/02/22 0420 05/03/22 0500 05/04/22 0437  Weight: 119.6 kg 120.7 kg 123.3 kg   Physical Exam: General: African American male, lying in bed, NAD HEENT: MM pink/moist, anicteric  Neuro: AAOx3 (person, place, year), speech clear  CV: s1s2 RRR, NRMG PULM: non-labored at rest, CTABL GI: soft, normal BS, cortrak in place   Extremities: warm/dry, no pitting edema, cap refill < 2 sec Skin: no rashes or lesions  Resolved Hospital Problem list   Multifactorial shock  Assessment & Plan:   Hypotension, presumed vasoplegia Issues with low BP, likely vasoplegia in setting of chronic anemia, critical illness and renal failure. BP stable and appropriate overnight. Patient needs to tolerate HD to leave ICU (see below).  -midodrine 40 mg TID -abx as below   ESRD HD MWF AKI on CKD 2/2 ATN in setting of acute pancreatitis and shock Patient needs access for dialysis, IR to place [facility] today. Procedure was postponed yesterday, planned for today. Patient missed HD yesterday, due for session today or tomorrow.  -F/u BMP -appreciate Nephrology -appreciate IR  -Trend BMP  -Replace electrolytes as indicated -Avoid nephrotoxic agents, ensure adequate renal perfusion, renal dose medications   Acute on Chronic Anemia Patient Hgb drop 6.7 this am, 1 unit PRBC transfused. Asymptomatic.  -f/u post transfusion h/h -FOBT -continue to monitor -transfuse for hgb < 7.0  DM II  DKA - resolved Glucose range 140's-190's yesterday. Patient received decreased long acting insulin 2/2  NPO status yesterday. Tube feeds restarted in evening. 67 units long acting and 8 units short acting.  -Levimir 22 units x1, reconsider in evening, if no TF started  -Continue Levimir 45 units BID, when TF restarted -Hold 13 units TF coverage, restart with TF -sSSI -CBG monitoring  -AM BMP    LUL Cavitary  Pneumonia COPD -DLCO 38% pred - 02/12/20 Underlying OSA Hx post-infectious scarring - no ILD on CT Cavitary PNA on CT chest. Afebrile, with improved respiratory status, on HFNC 6 L this am. Patient continues to get BiPAP prn and qhs. WBC stable.  -wean HFNC for sats 88-94% -pulmonary hygiene  -IS, mobilize -brovana, Yupelri + PRN albuterol  -ID following, appreciate recs -augmentin (8/14-9/4) -QHS & PRN daytime sleep bipap   Delirium Improved with BiPAP, suspect hypercarbic failure +/- hypoactive delirium. CT head neg. Alert and oriented x 3 today  (person/place/year), clear speech.  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  -diet Clear liquid w/ feeds at 60 cc/hr  -PRN tylenol  -atorvastatin   History of diastolic heart failure Echo 12/25/2019 revealed ejection fraction of 60 to 65%. Repeat echo showed normal EF with grade I diastolic dysfunction. Patient appears euvolemic on exam today.  -tele monitoring   Constipation - resolved Diarrhea Status post methylnaltrexone on 8/14 and smog enema. Patient with improved more formed stools. -bowel regimen  -TF at goal -minimize narcotics as able   Hiccups -gabapentin 100 mg TID PRN  Best Practice (right click and "Reselect all SmartList Selections" daily)  Diet/type: NPO DVT prophylaxis: prophylactic heparin  GI prophylaxis: N/A Lines: yes and it is still needed, PIV x 2 Foley:  Yes, and it is still needed Code Status:  full code Last date of multidisciplinary goals of care discussion: Patient updated 8/28.  Critical care time: 30 minutes   Holley Bouche, MD PGY-2 [facility] 05/04/2022, 10:24 AM   Please see [email or web address] for pager details.   From  7A-7P if no response, please call [phone] After hours, please call ELink [phone]

## 2022-05-04 NOTE — Progress Notes (Signed)
OT Cancellation Note  Patient Details Name: Mathew Lara MRN: [id number] DOB: 1961/04/08   Cancelled Treatment:    Reason Eval/Treat Not Completed: Patient at procedure or test/ unavailable (IR for cath placement. Will return as schedule allows.)  [place], OTR/L Acute Rehab Office: [phone] 05/04/2022, 3:15 PM

## 2022-05-04 NOTE — Progress Notes (Signed)
[name] PROGRESS NOTE  Assessment/ Plan:  # Anuric dialysis dependent AKI/CKD stage IIIb-IV - presumably ischemic ATN in setting of acute pancreatitis, hypotension, pressor support, with concomitant ARB, SGLT-2 inhibitor and finerenone.  Initially non-oliguric but now anuric.  -Continue to hold losartan, Jardiance, and Kerendia.   -Started on CRRT 04/13/22 - 04/25/22.  Started iHD 8/21. -The dialysis postponed from yesterday to today because of IR not able to place [facility].  Hopefully he will get catheter today and then plan for HD.    # Acute pancreatitis: resolved   # Shock -on off pressors per PCCM; on very high dose of midodrine.  #Acute hypoxic respiratory failure - with underlying COPD and OSA.  Currently on high flow oxygen via nasal cannula.  #Hep C with advanced liver fibrosis.  #Chronic diastolic CHF - stable.  #Anemia; transfuse prn.  Iron saturation 12 with high ferritin because of inflammatory state.  Started ESA, received a unit of blood transfusion today.  Subjective: Seen and examined at the bedside. So he was not able to get dialysis yesterday because IR was not able to place tunneled HD catheter.  Supposedly he is going to IR today.  He looks more alert and awake today.  No urine output noted.  Received a unit of blood transfusion for anemia.  Discussed with ICU nurses.  Objective Vital signs in last 24 hours: Vitals:   05/04/22 0600 05/04/22 0659 05/04/22 0729 05/04/22 0909  BP: 96/68 112/63    Pulse: (!) 114 (!) 111    Resp: (!) 22 (!) 23    Temp:  97.9 F (36.6 C) 98.2 F (36.8 C)   TempSrc:  Oral Oral   SpO2: 95% 91%  96%  Weight:      Height:       Weight change: 2.6 kg  Intake/Output Summary (Last 24 hours) at 05/04/2022 1052 Last data filed at 05/04/2022 0950 Gross per 24 hour  Intake 740.09 ml  Output 100 ml  Net 640.09 ml        Labs: RENAL PANEL Recent Labs    04/24/22 1551 04/25/22 0255 04/25/22 0534  04/26/22 0356 04/26/22 1816 04/27/22 0516 04/27/22 1536 04/28/22 0338 04/28/22 1550 04/29/22 0332 04/29/22 1515 04/29/22 2141 04/30/22 0317 04/30/22 1255 04/30/22 1725 04/30/22 2114 05/01/22 0600 05/01/22 0946 05/02/22 0045 05/02/22 0051 05/03/22 0111 05/03/22 0920 05/04/22 0044 05/04/22 0829  NA 136 134*   < > 133* 132* 131* 134* 133* 131* 128*   < > 133* 131* 134* 135 132* 132* 133* 133*  --   --  135  --  134*  K 4.6 4.7   < > 5.3* 3.9 4.4 4.5 4.7 4.1 4.4   < > 4.3 4.1 3.4* 3.8 3.9 4.0 4.0 4.5  --   --  5.1  --  5.0  CL 99 98  --  97* 93* 94* 95* 95* 91* 89*   < > 91* 91* 91* 91* 91* 91* 91* 92*  --   --  92*  --  93*  CO2 25 24  --  20* '26 26 25 23 24 24   '$ < > '24 24 27 28 24 24 24 '$ 20*  --   --  22  --  22  GLUCOSE 228* 212*  --  179* 173* 197* 257* 290* 305* 417*   < > 222* 235* 155* 202* 178* 189* 223* 241*  --   --  150*  --  132*  BUN 29* 33*  --  44* 26* 36* 46* 58* 33* 42*   < > 57* 60* 22 27* 31* 41* 40* 58*  --   --  81*  --  95*  CREATININE 3.06* 3.86*  --  5.40* 3.94* 5.69* 6.89* 8.60* 5.69* 7.13*   < > 9.08* 9.21* 4.25* 5.32* 5.65* 6.77* 7.24* 8.72*  --   --  11.30*  --  12.73*  CALCIUM 9.6 9.6  --  9.7 8.9 8.9 9.4 9.4 9.0 9.0   < > 9.3 9.4 9.2 9.6 9.6 9.7 9.7 9.7  --   --  10.1  --  9.8  MG  --  2.9*  --  2.8*  --  2.3  --  2.6*  --  2.1  --   --  2.4  --   --   --  1.9  --   --  2.2 2.4  --  2.4  --   PHOS 2.9 3.8  --  4.4 3.1 5.5* 6.2* 6.6* 4.9* 5.3*  --   --   --   --   --   --   --   --  6.3*  --   --   --   --   --   ALBUMIN 2.3* 2.3*  --  2.3* 2.3* 2.3* 2.3* 2.4* 2.4* 2.2*  --   --   --   --   --   --   --   --  2.6*  --   --   --   --   --    < > = values in this interval not displayed.      Liver Function Tests: Recent Labs  Lab 04/28/22 1550 04/29/22 0332 05/02/22 0045  ALBUMIN 2.4* 2.2* 2.6*    No results for input(s): "LIPASE", "AMYLASE" in the last 168 hours. No results for input(s): "AMMONIA" in the last 168 hours. CBC: Recent Labs     05/01/22 0600 05/01/22 0946 05/02/22 0051 05/03/22 0111 05/04/22 0044 05/04/22 0829  HGB 7.7*  --  8.2* 7.4* 6.7* 7.3*  MCV 84.3  --  87.0 86.3 86.3  --   FERRITIN 1,149*  --   --   --   --   --   TIBC  --  284  --   --   --   --   IRON  --  35*  --   --   --   --      Cardiac Enzymes: No results for input(s): "CKTOTAL", "CKMB", "CKMBINDEX", "TROPONINI" in the last 168 hours. CBG: Recent Labs  Lab 05/03/22 1524 05/03/22 1936 05/03/22 2330 05/04/22 0343 05/04/22 0714  GLUCAP 164* 191* 144* 149* 130*     Iron Studies:  No results for input(s): "IRON", "TIBC", "TRANSFERRIN", "FERRITIN" in the last 72 hours.  Studies/Results: No results found.  Medications: Infusions:  sodium chloride Stopped (05/02/22 1645)   sodium chloride      ceFAZolin (ANCEF) IV     dextrose     feeding supplement (VITAL 1.5 CAL)     ferric gluconate (FERRLECIT) IVPB Stopped (05/03/22 1216)   norepinephrine (LEVOPHED) Adult infusion Stopped (05/02/22 2016)    Scheduled Medications:  sodium chloride   Intravenous Once   amoxicillin-clavulanate  500 mg Per Tube Q12H   arformoterol  15 mcg Nebulization BID   atorvastatin  80 mg Per Tube Daily   Chlorhexidine Gluconate Cloth  6 each Topical Daily   darbepoetin (ARANESP) injection - DIALYSIS  60 mcg Intravenous Q  Mon-HD   feeding supplement (NEPRO CARB STEADY)  237 mL Oral TID BM   [START ON 05/05/2022] feeding supplement (PROSource TF20)  70 mL Per Tube Daily   heparin  5,000 Units Subcutaneous Q8H   insulin aspart  1-3 Units Subcutaneous Q4H   insulin aspart  13 Units Subcutaneous Q4H   insulin detemir  45 Units Subcutaneous Q12H   midodrine  40 mg Per Tube Q8H   multivitamin  1 tablet Per Tube QHS   mouth rinse  15 mL Mouth Rinse 4 times per day   pantoprazole  40 mg Per Tube BID   QUEtiapine  50 mg Per Tube QHS   revefenacin  175 mcg Nebulization Daily   simethicone  80 mg Per Tube QID   sodium chloride flush  10-40 mL Intracatheter  Q12H    have reviewed scheduled and prn medications.  Physical Exam: General: Looks more alert awake and able to lie flat comfortably. Heart:RRR, s1s2 nl Lungs:clear b/l, no crackle Abdomen:soft, Non-tender, non-distended Extremities:No edema Dialysis Access: None now.  Elane Peabody Prasad Georges Victorio 05/04/2022,10:52 AM  LOS: 24 days

## 2022-05-04 NOTE — TOC Progression Note (Addendum)
Transition of Care [facility]) - Initial/Assessment Note    Patient Details  Name: Mathew Lara MRN: [id number] Date of Birth: Sep 08, 1960  [facility] of [facility]) CM/SW Contact:    Milinda Antis, [place] Phone Number: 05/04/2022, 10:55 AM  Clinical Narrative:                 10:55- Insurance is requesting peer to peer for patient to be completed by 14:30 today.  MD notified.   15:30- Insurance auth denied for [facility].   Expedited appeal to be submitted.  Should insurance continue to deny LTACH, patient is open to going to a SNF  at a facility in [place].    TOC will continue to follow.   Barriers to Discharge: Continued Medical Work up   Patient Goals and CMS Choice        Expected Discharge Plan and Services                                                Prior Living Arrangements/Services                       Activities of Daily Living Home Assistive Devices/Equipment: Environmental consultant (specify type) ADL Screening (condition at time of admission) Patient's cognitive ability adequate to safely complete daily activities?: No Is the patient deaf or have difficulty hearing?: No Does the patient have difficulty seeing, even when wearing glasses/contacts?: No Does the patient have difficulty concentrating, remembering, or making decisions?: Yes Patient able to express need for assistance with ADLs?: Yes Does the patient have difficulty dressing or bathing?: No Independently performs ADLs?: Yes (appropriate for developmental age) Does the patient have difficulty walking or climbing stairs?: No Weakness of Legs: Both Weakness of Arms/Hands: None  Permission Sought/Granted                  Emotional Assessment              Admission diagnosis:  DKA (diabetic ketoacidosis) ([place]) [E11.10] Acute pancreatitis, unspecified complication status, unspecified pancreatitis type [K85.90] Patient Active Problem List   Diagnosis Date Noted   Cavitary pneumonia     Leukocytosis    Bile duct abnormality    Abnormal CT of the abdomen    Shock ([place])    DKA (diabetic ketoacidosis) ([place]) 04/10/2022   Acute pancreatitis 04/10/2022   SIRS (systemic inflammatory response syndrome) ([place]) 04/10/2022   Hypertriglyceridemia 04/10/2022   Hypercholesterolemia 04/10/2022   Lactic acidosis 04/10/2022   Abdominal bloating 02/17/2022   Encounter for screening colonoscopy 06/17/2021   Fatty liver 03/05/2021   Chronic cholecystitis 02/24/2021   Exercise hypoxemia 06/03/2020   OSA on CPAP 06/03/2020   Healthcare maintenance 06/03/2020   ILD (interstitial lung disease) ([place]) 10/02/2019   GERD (gastroesophageal reflux disease) 10/02/2019   COPD with acute exacerbation ([place]) 09/28/2019   AKI on CKD stage 3b 09/19/2019   Acute respiratory failure with hypoxia ([place])    Chronic pain syndrome    History of COVID-19 08/29/2019   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   Acute renal failure ([place]) 03/26/2018   Rash 03/26/2018   Acute kidney failure, unspecified ([place]) 03/26/2018   Chest pain 03/08/2018   CKD (chronic kidney disease), stage III ([place]) 03/08/2018   COPD (chronic obstructive pulmonary disease) ([place]) 03/08/2018   Chronic kidney disease, stage 3  unspecified ([place]) 03/08/2018   Chronic obstructive pulmonary disease ([place]) 03/08/2018   Avascular necrosis of bone of left hip ([place]) 07/27/2017   Avascular necrosis of bone of right hip ([place]) 06/04/2016   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

## 2022-05-04 NOTE — Procedures (Signed)
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 23 cm tip to cuff length tunneled Palindrome HD catheter placed via left IJ vein with tip in RA. OK to use.  Venetia Night. Kathlene Cote, M.D Pager:  [phone]

## 2022-05-04 NOTE — Sedation Documentation (Signed)
Patient is resting comfortably. 

## 2022-05-04 NOTE — Procedures (Signed)
Cortrak  Person Inserting Tube:  Mathew Lara D, RD Tube Type:  Cortrak - 55 inches Tube Size:  10 Tube Location:  Left nare Secured by: Bridle Technique Used to Measure Tube Placement:  Marking at nare/corner of mouth Cortrak Secured At:  94 cm  Cortrak Tube Team Note:  Consult received to place a Cortrak feeding tube.   X-ray is required, abdominal x-ray has been ordered by the Cortrak team. Please confirm tube placement before using the Cortrak tube.   If the tube becomes dislodged please keep the tube and contact the Cortrak team at [email or web address] (password [email or web address]) for replacement.  If after hours and replacement cannot be delayed, place a NG tube and confirm placement with an abdominal x-ray.    Mathew Lara, RD, LDN Clinical Dietitian RD pager # available in [place]  After hours/weekend pager # available in [facility]

## 2022-05-04 NOTE — Progress Notes (Signed)
[place] Progress Note Patient Name: Mathew Lara DOB: [birth year] MRN: [id number]   Date of Service  05/04/2022  HPI/Events of Note  Notified of anemia with hgb of 6.7.  No signs of active bleeding.   eICU Interventions  Transfuse 1 unit pRBC.     Intervention Category Intermediate Interventions: Other:  [name] 05/04/2022, 2:02 AM

## 2022-05-04 NOTE — Progress Notes (Signed)
Pt nauseated at this time off on BIPAP for a while.  No distress noted.

## 2022-05-05 ENCOUNTER — Inpatient Hospital Stay (HOSPITAL_COMMUNITY): Payer: Medicare Other

## 2022-05-05 DIAGNOSIS — J984 Other disorders of lung: Secondary | ICD-10-CM | POA: Diagnosis not present

## 2022-05-05 DIAGNOSIS — I950 Idiopathic hypotension: Secondary | ICD-10-CM

## 2022-05-05 DIAGNOSIS — N179 Acute kidney failure, unspecified: Secondary | ICD-10-CM | POA: Diagnosis not present

## 2022-05-05 DIAGNOSIS — J189 Pneumonia, unspecified organism: Secondary | ICD-10-CM | POA: Diagnosis not present

## 2022-05-05 LAB — BASIC METABOLIC PANEL
Anion gap: 19 — ABNORMAL HIGH (ref 5–15)
BUN: 100 mg/dL — ABNORMAL HIGH (ref 8–23)
CO2: 22 mmol/L (ref 22–32)
Calcium: 9.3 mg/dL (ref 8.9–10.3)
Chloride: 94 mmol/L — ABNORMAL LOW (ref 98–111)
Creatinine, Ser: 13.23 mg/dL — ABNORMAL HIGH (ref 0.61–1.24)
GFR, Estimated: 4 mL/min — ABNORMAL LOW (ref >60)
Glucose, Bld: 175 mg/dL — ABNORMAL HIGH (ref 70–99)
Potassium: 4.7 mmol/L (ref 3.5–5.1)
Sodium: 135 mmol/L (ref 135–145)

## 2022-05-05 LAB — CBC
HCT: 22.7 % — ABNORMAL LOW (ref 39.0–52.0)
Hemoglobin: 7.1 g/dL — ABNORMAL LOW (ref 13.0–17.0)
MCH: 27 pg (ref 26.0–34.0)
MCHC: 31.3 g/dL (ref 30.0–36.0)
MCV: 86.3 fL (ref 80.0–100.0)
Platelets: 281 10*3/uL (ref 150–400)
RBC: 2.63 MIL/uL — ABNORMAL LOW (ref 4.22–5.81)
RDW: 17.2 % — ABNORMAL HIGH (ref 11.5–15.5)
WBC: 12.3 10*3/uL — ABNORMAL HIGH (ref 4.0–10.5)
nRBC: 0 % (ref 0.0–0.2)

## 2022-05-05 LAB — BPAM RBC
Blood Product Expiration Date: 202309222359
ISSUE DATE / TIME: 202308290504
Unit Type and Rh: 5100

## 2022-05-05 LAB — TYPE AND SCREEN
ABO/RH(D): O POS
Antibody Screen: NEGATIVE
Unit division: 0

## 2022-05-05 LAB — GLUCOSE, CAPILLARY
Glucose-Capillary: 124 mg/dL — ABNORMAL HIGH (ref 70–99)
Glucose-Capillary: 129 mg/dL — ABNORMAL HIGH (ref 70–99)
Glucose-Capillary: 139 mg/dL — ABNORMAL HIGH (ref 70–99)
Glucose-Capillary: 159 mg/dL — ABNORMAL HIGH (ref 70–99)
Glucose-Capillary: 186 mg/dL — ABNORMAL HIGH (ref 70–99)
Glucose-Capillary: 218 mg/dL — ABNORMAL HIGH (ref 70–99)

## 2022-05-05 LAB — MAGNESIUM: Magnesium: 2.2 mg/dL (ref 1.7–2.4)

## 2022-05-05 MED ORDER — HEPARIN SODIUM (PORCINE) 1000 UNIT/ML IJ SOLN
INTRAMUSCULAR | Status: AC
  Filled 2022-05-05: qty 4

## 2022-05-05 MED ORDER — HEPARIN SODIUM (PORCINE) 1000 UNIT/ML IJ SOLN
INTRAMUSCULAR | Status: AC
  Administered 2022-05-05: 3800 [IU] via INTRAVENOUS
  Filled 2022-05-05: qty 4

## 2022-05-05 MED ORDER — CHLORHEXIDINE GLUCONATE CLOTH 2 % EX PADS
6.0000 | MEDICATED_PAD | Freq: Every day | CUTANEOUS | Status: DC
  Administered 2022-05-06 – 2022-05-13 (×3): 6 via TOPICAL

## 2022-05-05 MED ORDER — ALTEPLASE 2 MG IJ SOLR
2.0000 mg | Freq: Once | INTRAMUSCULAR | Status: AC
  Administered 2022-05-05: 2 mg
  Filled 2022-05-05: qty 2

## 2022-05-05 MED ORDER — SODIUM ZIRCONIUM CYCLOSILICATE 10 G PO PACK
10.0000 g | PACK | Freq: Once | ORAL | Status: AC
Start: 2022-05-05 — End: 2022-05-05
  Administered 2022-05-05: 10 g via ORAL
  Filled 2022-05-05: qty 1

## 2022-05-05 MED ORDER — VITAL 1.5 CAL PO LIQD
850.0000 mL | ORAL | Status: DC
  Administered 2022-05-05 – 2022-05-06 (×2): 850 mL
  Filled 2022-05-05: qty 948
  Filled 2022-05-05 (×3): qty 1000
  Filled 2022-05-05: qty 948
  Filled 2022-05-05 (×2): qty 1000
  Filled 2022-05-05: qty 948
  Filled 2022-05-05: qty 1000
  Filled 2022-05-05 (×3): qty 948
  Filled 2022-05-05: qty 1000
  Filled 2022-05-05: qty 948
  Filled 2022-05-05 (×3): qty 1000

## 2022-05-05 NOTE — Progress Notes (Addendum)
[facility] NEPHROLOGY PROGRESS NOTE  Assessment/ Plan:  # Anuric dialysis dependent AKI/CKD stage IIIb-IV - presumably ischemic ATN in setting of acute pancreatitis, hypotension, pressor support, with concomitant ARB, SGLT-2 inhibitor and finerenone.  Initially non-oliguric but now anuric.  -Continue to hold losartan, Jardiance, and Kerendia.   -Started on CRRT 04/13/22 - 04/25/22.  Started iHD 8/21. -s/p TIJ TDC placed by IR on 8/29.  There was problem with the catheter last night during dialysis therefore unable to complete treatment.  Reportedly the patient had only 20 minutes of HD and then the tPA was used to the catheter.  The x-ray was done today showing the tip of the catheter in SVC.  We will attempt to do HD today, hopefully there will be no problem.  If there is issue with the catheter then he may need temporary HD cath versus TDC exchange by IR.  Discussed with ICU team.  # Acute pancreatitis: resolved   # Shock -on off pressors per PCCM; on very high dose of midodrine.  #Acute hypoxic respiratory failure - with underlying COPD and OSA.  Currently on high flow oxygen via nasal cannula.  #Hep C with advanced liver fibrosis.  #Chronic diastolic CHF - stable.  #Anemia; transfuse prn.  Iron saturation 12 with high ferritin because of inflammatory state.  Started ESA, receiving blood transfusion intermittently.  Subjective: Seen and examined at the bedside.  Overnight event noted.  Problem with the catheter therefore tPA was used.  He is alert awake but has some shortness of breath.  Denies chest pain, nausea or vomiting. Objective Vital signs in last 24 hours: Vitals:   05/05/22 0600 05/05/22 0700 05/05/22 0719 05/05/22 0800  BP: 104/65 113/65  93/75  Pulse: (!) 113 (!) 116  (!) 116  Resp: (!) 22 (!) 24  20  Temp:   98.2 F (36.8 C)   TempSrc:   Oral   SpO2: 92%   93%  Weight:      Height:       Weight change: 0 kg  Intake/Output Summary (Last 24 hours)  at 05/05/2022 1048 Last data filed at 05/05/2022 0921 Gross per 24 hour  Intake 1156.09 ml  Output 965 ml  Net 191.09 ml        Labs: RENAL PANEL Recent Labs    04/24/22 1551 04/25/22 0255 04/25/22 0534 04/26/22 0356 04/26/22 1816 04/27/22 0516 04/27/22 1536 04/28/22 0338 04/28/22 1550 04/29/22 0332 04/29/22 1515 04/30/22 0317 04/30/22 1255 04/30/22 1725 04/30/22 2114 05/01/22 0600 05/01/22 0946 05/02/22 0045 05/02/22 0051 05/03/22 0111 05/03/22 0920 05/04/22 0044 05/04/22 0829 05/05/22 0220  NA 136 134*   < > 133* 132* 131* 134* 133* 131* 128*   < > 131* 134* 135 132* 132* 133* 133*  --   --  135  --  134* 135  K 4.6 4.7   < > 5.3* 3.9 4.4 4.5 4.7 4.1 4.4   < > 4.1 3.4* 3.8 3.9 4.0 4.0 4.5  --   --  5.1  --  5.0 4.7  CL 99 98  --  97* 93* 94* 95* 95* 91* 89*   < > 91* 91* 91* 91* 91* 91* 92*  --   --  92*  --  93* 94*  CO2 25 24  --  20* '26 26 25 23 24 24   '$ < > '24 27 28 24 24 24 '$ 20*  --   --  22  --  22 22  GLUCOSE 228*  212*  --  179* 173* 197* 257* 290* 305* 417*   < > 235* 155* 202* 178* 189* 223* 241*  --   --  150*  --  132* 175*  BUN 29* 33*  --  44* 26* 36* 46* 58* 33* 42*   < > 60* 22 27* 31* 41* 40* 58*  --   --  81*  --  95* 100*  CREATININE 3.06* 3.86*  --  5.40* 3.94* 5.69* 6.89* 8.60* 5.69* 7.13*   < > 9.21* 4.25* 5.32* 5.65* 6.77* 7.24* 8.72*  --   --  11.30*  --  12.73* 13.23*  CALCIUM 9.6 9.6  --  9.7 8.9 8.9 9.4 9.4 9.0 9.0   < > 9.4 9.2 9.6 9.6 9.7 9.7 9.7  --   --  10.1  --  9.8 9.3  MG  --  2.9*  --  2.8*  --  2.3  --  2.6*  --  2.1  --  2.4  --   --   --  1.9  --   --  2.2 2.4  --  2.4  --  2.2  PHOS 2.9 3.8  --  4.4 3.1 5.5* 6.2* 6.6* 4.9* 5.3*  --   --   --   --   --   --   --  6.3*  --   --   --   --   --   --   ALBUMIN 2.3* 2.3*  --  2.3* 2.3* 2.3* 2.3* 2.4* 2.4* 2.2*  --   --   --   --   --   --   --  2.6*  --   --   --   --   --   --    < > = values in this interval not displayed.      Liver Function Tests: Recent Labs  Lab  04/28/22 1550 04/29/22 0332 05/02/22 0045  ALBUMIN 2.4* 2.2* 2.6*    No results for input(s): "LIPASE", "AMYLASE" in the last 168 hours. No results for input(s): "AMMONIA" in the last 168 hours. CBC: Recent Labs    05/01/22 0600 05/01/22 0946 05/02/22 0051 05/03/22 0111 05/04/22 0044 05/04/22 0829 05/05/22 0220  HGB 7.7*  --  8.2* 7.4* 6.7* 7.3* 7.1*  MCV 84.3  --  87.0 86.3 86.3  --  86.3  FERRITIN 1,149*  --   --   --   --   --   --   TIBC  --  284  --   --   --   --   --   IRON  --  35*  --   --   --   --   --      Cardiac Enzymes: No results for input(s): "CKTOTAL", "CKMB", "CKMBINDEX", "TROPONINI" in the last 168 hours. CBG: Recent Labs  Lab 05/04/22 1514 05/04/22 1947 05/04/22 2333 05/05/22 0341 05/05/22 0718  GLUCAP 138* 154* 196* 129* 139*     Iron Studies:  No results for input(s): "IRON", "TIBC", "TRANSFERRIN", "FERRITIN" in the last 72 hours.  Studies/Results: DG CHEST PORT 1 VIEW  Result Date: 05/05/2022 CLINICAL DATA: Complication associated with dialysis catheter. EXAM: PORTABLE CHEST 1 VIEW COMPARISON:  05/02/2022 FINDINGS: Left dialysis catheter tip is in the SVC. Cardiomegaly with vascular congestion. Chronic changes in the left lung again noted. Left upper lobe thick walled cavitary area add not as well visualized as on prior CT. No effusions. No acute bony  abnormality. IMPRESSION: Left dialysis catheter tip in the SVC. Otherwise no change.  No acute cardiopulmonary disease. Electronically Signed   By: Rolm Baptise M.D.   On: 05/05/2022 09:22   IR Fluoro Guide CV Line Left  Result Date: 05/04/2022 CLINICAL DATA:  End-stage renal disease and need for tunneled hemodialysis catheter. Recent placement and removal of temporary dialysis catheter via right internal jugular vein. EXAM: TUNNELED CENTRAL VENOUS HEMODIALYSIS CATHETER PLACEMENT WITH ULTRASOUND AND FLUOROSCOPIC GUIDANCE ANESTHESIA/SEDATION: Moderate (conscious) sedation was employed during this  procedure. A total of Versed 1.5 mg and Fentanyl 50 mcg was administered intravenously by radiology nursing. Moderate Sedation Time: 47 minutes. The patient's level of consciousness and vital signs were monitored continuously by radiology nursing throughout the procedure under my direct supervision. MEDICATIONS: 2 g IV Ancef. FLUOROSCOPY: 3 minutes and 42 seconds.  282 mGy. PROCEDURE: The procedure, risks, benefits, and alternatives were explained to the patient. Questions regarding the procedure were encouraged and answered. The patient understands and consents to the procedure. A timeout was performed prior to initiating the procedure. The right neck and chest were prepped with chlorhexidine in a sterile fashion, and a sterile drape was applied covering the operative field. Maximum barrier sterile technique with sterile gowns and gloves were used for the procedure. Local anesthesia was provided with 1% lidocaine. During the procedure the left neck and chest were also prepped with chlorhexidine and draped. Ultrasound was performed of the right internal jugular vein and a permanent ultrasound image was recorded and saved. Initial puncture of the right internal jugular vein was performed under ultrasound guidance. Guidewire passage was attempted under fluoroscopy. Needle and wire were removed. Ultrasound was performed of the left internal jugular vein and a permanent ultrasound image was recorded and saved. After creating a small venotomy incision, a 21 gauge needle was advanced into the left internal jugular vein under direct, real-time ultrasound guidance. Ultrasound image documentation was performed. After securing guidewire access, an 8 Fr dilator was placed. A J-wire was kinked to measure appropriate catheter length. A Palindrome tunneled hemodialysis catheter measuring 23 cm from tip to cuff was chosen for placement. This was tunneled in a retrograde fashion from the chest wall to the venotomy incision. At the  venotomy, serial dilatation was performed and a 15 Fr peel-away sheath was placed over a guidewire. The catheter was then placed through the sheath and the sheath removed. Final catheter positioning was confirmed and documented with a fluoroscopic spot image. The catheter was aspirated, flushed with saline, and injected with appropriate volume heparin dwells. The venotomy incision was closed with subcuticular 4-0 Vicryl. Dermabond was applied to the incision. The catheter exit site was secured with 0-Prolene retention sutures. COMPLICATIONS: None.  No pneumothorax. FINDINGS: Initial ultrasound demonstrates thrombus in the right internal jugular vein. Due to what appear to be initially slow flow, access of the vein was attempted but a guidewire would not pass below the clavicle. Additional ultrasound demonstrates occlusive and chronic appearing thrombus in the right internal jugular vein related to prior temporary dialysis catheter placement. The left internal jugular vein is normally patent. A tunneled dialysis catheter was placed via the left internal jugular vein successfully. After catheter placement, the tip lies in the right atrium. The catheter aspirates normally and is ready for immediate use. IMPRESSION: 1. 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 2. Placement of tunneled hemodialysis catheter via the left internal jugular vein. The catheter tip lies in  the right atrium. The catheter is ready for immediate use. Electronically Signed   By: Aletta Edouard M.D.   On: 05/04/2022 15:15   IR US Guide Vasc Access Left  Result Date: 05/04/2022 CLINICAL DATA:  End-stage renal disease and need for tunneled hemodialysis catheter. Recent placement and removal of temporary dialysis catheter via right internal jugular vein. EXAM: TUNNELED CENTRAL VENOUS HEMODIALYSIS CATHETER PLACEMENT WITH ULTRASOUND AND FLUOROSCOPIC GUIDANCE  ANESTHESIA/SEDATION: Moderate (conscious) sedation was employed during this procedure. A total of Versed 1.5 mg and Fentanyl 50 mcg was administered intravenously by radiology nursing. Moderate Sedation Time: 47 minutes. The patient's level of consciousness and vital signs were monitored continuously by radiology nursing throughout the procedure under my direct supervision. MEDICATIONS: 2 g IV Ancef. FLUOROSCOPY: 3 minutes and 42 seconds.  282 mGy. PROCEDURE: The procedure, risks, benefits, and alternatives were explained to the patient. Questions regarding the procedure were encouraged and answered. The patient understands and consents to the procedure. A timeout was performed prior to initiating the procedure. The right neck and chest were prepped with chlorhexidine in a sterile fashion, and a sterile drape was applied covering the operative field. Maximum barrier sterile technique with sterile gowns and gloves were used for the procedure. Local anesthesia was provided with 1% lidocaine. During the procedure the left neck and chest were also prepped with chlorhexidine and draped. Ultrasound was performed of the right internal jugular vein and a permanent ultrasound image was recorded and saved. Initial puncture of the right internal jugular vein was performed under ultrasound guidance. Guidewire passage was attempted under fluoroscopy. Needle and wire were removed. Ultrasound was performed of the left internal jugular vein and a permanent ultrasound image was recorded and saved. After creating a small venotomy incision, a 21 gauge needle was advanced into the left internal jugular vein under direct, real-time ultrasound guidance. Ultrasound image documentation was performed. After securing guidewire access, an 8 Fr dilator was placed. A J-wire was kinked to measure appropriate catheter length. A Palindrome tunneled hemodialysis catheter measuring 23 cm from tip to cuff was chosen for placement. This was tunneled  in a retrograde fashion from the chest wall to the venotomy incision. At the venotomy, serial dilatation was performed and a 15 Fr peel-away sheath was placed over a guidewire. The catheter was then placed through the sheath and the sheath removed. Final catheter positioning was confirmed and documented with a fluoroscopic spot image. The catheter was aspirated, flushed with saline, and injected with appropriate volume heparin dwells. The venotomy incision was closed with subcuticular 4-0 Vicryl. Dermabond was applied to the incision. The catheter exit site was secured with 0-Prolene retention sutures. COMPLICATIONS: None.  No pneumothorax. FINDINGS: Initial ultrasound demonstrates thrombus in the right internal jugular vein. Due to what appear to be initially slow flow, access of the vein was attempted but a guidewire would not pass below the clavicle. Additional ultrasound demonstrates occlusive and chronic appearing thrombus in the right internal jugular vein related to prior temporary dialysis catheter placement. The left internal jugular vein is normally patent. A tunneled dialysis catheter was placed via the left internal jugular vein successfully. After catheter placement, the tip lies in the right atrium. The catheter aspirates normally and is ready for immediate use. IMPRESSION: 1. 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 2. Placement of tunneled hemodialysis catheter via the left internal jugular vein. The catheter tip lies in the right atrium. The catheter is ready  for immediate use. Electronically Signed   By: Aletta Edouard M.D.   On: 05/04/2022 15:15   DG Abd Portable 1V  Result Date: 05/04/2022 CLINICAL DATA:  Feeding tube placement EXAM: PORTABLE ABDOMEN - 1 VIEW COMPARISON:  Radiograph 05/02/2019 FINDINGS: Feeding tube tip overlies the distal descending duodenum, retracted since the prior exam. Nonobstructive bowel  gas pattern. IMPRESSION: Feeding tube tip overlies the distal descending duodenum, retracted since the prior exam. Electronically Signed   By: Maurine Simmering M.D.   On: 05/04/2022 11:29    Medications: Infusions:  sodium chloride Stopped (05/02/22 1645)   sodium chloride     dextrose     feeding supplement (VITAL 1.5 CAL) Stopped (05/05/22 0030)   norepinephrine (LEVOPHED) Adult infusion Stopped (05/02/22 2016)    Scheduled Medications:  sodium chloride   Intravenous Once   amoxicillin-clavulanate  500 mg Per Tube Q12H   arformoterol  15 mcg Nebulization BID   atorvastatin  80 mg Per Tube Daily   Chlorhexidine Gluconate Cloth  6 each Topical Daily   Chlorhexidine Gluconate Cloth  6 each Topical Q0600   darbepoetin (ARANESP) injection - DIALYSIS  60 mcg Intravenous Q Mon-HD   feeding supplement (NEPRO CARB STEADY)  237 mL Oral TID BM   feeding supplement (PROSource TF20)  70 mL Per Tube Daily   heparin  5,000 Units Subcutaneous Q8H   heparin sodium (porcine)       insulin aspart  1-3 Units Subcutaneous Q4H   insulin aspart  13 Units Subcutaneous Q4H   insulin detemir  45 Units Subcutaneous Q12H   midodrine  40 mg Per Tube Q8H   multivitamin  1 tablet Per Tube QHS   mouth rinse  15 mL Mouth Rinse 4 times per day   pantoprazole  40 mg Per Tube BID   QUEtiapine  50 mg Per Tube QHS   revefenacin  175 mcg Nebulization Daily   simethicone  80 mg Per Tube QID   sodium chloride flush  10-40 mL Intracatheter Q12H    have reviewed scheduled and prn medications.  Physical Exam: General: He is alert awake, sitting on bed, no increased work of breathing Heart:RRR, s1s2 nl Lungs: Coarse and rhonchi breathing sound bibasal Abdomen:soft, Non-tender, non-distended Extremities: Trace pedal edema present Dialysis Access: Right IJ TDC in place, site clean.  Paytin Ramakrishnan Prasad Britta Louth 05/05/2022,10:48 AM  LOS: 25 days

## 2022-05-05 NOTE — Progress Notes (Addendum)
   05/05/22 0400  Post Treatment  Dialyzer Clearance Lightly streaked  Duration of HD Treatment -hour(s) 0.5 hour(s)  Fluid Removed 357.5 (via [employer] adviser)  Tolerated HD Treatment Yes  Hemodialysis Catheter Left Internal jugular Double lumen Permanent (Tunneled)  Placement Date/Time: 05/04/22 1430   Serial / Lot #: [id number]  Expiration Date: 09/26/26  Time Out: Correct patient;Correct site;Correct procedure  Maximum sterile barrier precautions: Hand hygiene;Cap;Mask;Sterile gown;Large sterile sheet  Site Pre...  Site Condition Other (Comment);No complications  Blue Lumen Status Occluded (Ven port of cath. became stiff to no pull or push, could be move at times with 40m syringe.)  Red Lumen Status Flushed;Infusing  Catheter fill volume (Arterial) 1.9 cc  Catheter fill volume (Venous) 1.9  Dressing Type Gauze/Drain sponge  Dressing Status Clean, Dry, Intact  Dressing Change Due 05/08/22   Tablo had a tmp alarm, once then twice. It lock on alarm screen with a loss of blood count down. I called TEngineer, building servicesand we quickly discovered there was no way around the alarm sreen. He guided me threw manual crack back, and half way threw I thought I saw fibrous blood ,poss. Clot so I stopped blood return. We trouble shot the problem. Rule out the filter by flushing it and test the the ven. Return. The ven. Port had no push or pull once the PT had a coughing spell. Will install TPA over night per Dr. CMarval Regalin prep for eavl. In am. TSharene Skeansadviser was MRandie Heinz there was apox. 1032mof blood loss.

## 2022-05-05 NOTE — Progress Notes (Signed)
NAME:  Mathew Lara, MRN:  [id number], DOB:  [birth year], LOS: 44 ADMISSION DATE:  04/10/2022, CONSULTATION DATE:  8/5 REFERRING MD:  Darrell Jewel, CHIEF COMPLAINT: Abdominal pain  History of Present Illness:  Mathew Lara is an 61 y.o. M who presented to the [facility] ED on 8/5 with a chief complaint of abdominal pain.  They have a past medical history of alcohol abuse, anxiety, CKD 3, COPD, OSA, type 2 diabetes, hypertension, hyperlipidemia, hep C+  ED work-up revealed DKA, acute pancreatitis, AKI.  He was admitted to the hospital service.  Overnight 8/5 he developed continuing respiratory distress, shock.   PCCM was consulted for transfer to [facility] for further management.   Pertinent  Medical History  Alcohol abuse, anxiety, CKD 3, COPD, OSA, type 2 diabetes, hypertension, hyperlipidemia, hep C  Significant Hospital Events: Including procedures, antibiotic start and stop dates in addition to other pertinent events   8/5 Presented to [facility] ED, transferred [facility] 8/7 On pressors  8/8 BiPAP, worsening renal parameters 8/9 Tolerating CRRT, on BiPAP 8/10 has been off BiPAP, on high flow oxygen and tolerating 8/11 UOP improving. CRRT with net even UF 8/14 CT reveals worsening cavitary pneumonia and acute pancreatitis, large BM 8/15 abx changed for cavitary pneumonia 8/16 ID consult.  ID stopped vancomycin, Zosyn, azithromycin >narrowed to augmentin. CRRT stopped.   8/17 CRRT restarted.  Off pressors 8/19 Remains off pressors, CRRT ongoing. HD cath required tPA.  8/21 iHD, off CRRT 8/29 patient had Tunneled HD cath placed  Interim History / Subjective:  Afebrile overnight, respiratory status continues to improve. Permacath clotted during HD this am.    Objective   Blood pressure 93/75, pulse (!) 116, temperature 98.2 F (36.8 C), temperature source Oral, resp. rate 20, height '5\' 8"'$  (1.727 m), weight 123.3 kg, SpO2 93 %.      Intake/Output Summary (Last 24  hours) at 05/05/2022 1112 Last data filed at 05/05/2022 7829 Gross per 24 hour  Intake 1156.09 ml  Output 965 ml  Net 191.09 ml   Filed Weights   05/03/22 0500 05/04/22 0437 05/05/22 0227  Weight: 120.7 kg 123.3 kg 123.3 kg   Physical Exam: General: African American male, lying in bed, NAD, eating breakfast HEENT: MM pink/moist, anicteric  Neuro: AAOx3 (person, place, year), speech clear  CV: s1s2 RRR, NRMG PULM: Good WOB, CTABL, shallow breaths GI: soft, normal BS, cortrak in place, non-distended, non-TTP   Extremities: warm/dry, no pitting edema, cap refill < 2 sec Skin: no rashes or lesions  Resolved Hospital Problem list   Multifactorial shock  Assessment & Plan:   Hypotension, presumed vasoplegia Issues with low BP, likely vasoplegia in setting of chronic anemia, critical illness and renal failure. BP mostly stable and appropriate overnight. Patient BP needs to tolerate HD to leave ICU (see below).  -midodrine 40 mg TID   HD MWF AKI on CKD 2/2 ATN in setting of acute pancreatitis and shock Patient had permcath placed yesterday, dialysis attempted early am but cath clotted, TPA placed this am, nephro to re-eval. K 4.7, BUN 100, Cr 13.23 this am. Patient to get HD today, will consider placing 2nd access if necessary.  -appreciate Nephrology, HD today -Trend BMP  -Replace electrolytes as indicated -Avoid nephrotoxic agents, ensure adequate renal perfusion, renal dose medications  Malnutrition Patient has been eating partial meals and tolerating well. Will encourage PO and adjust TF to nightly.  -TF qhs for 12 hours daily -Renal/low carb diet  DM II -  w/ hyperglycemia DKA - resolved Glucose range 120's-190's yesterday. Restart TF qhs for 12 hrs. -Continue Levimir 45 units BID,  -13 units TF coverage, restart with TF -sSSI -CBG monitoring  -AM BMP   Acute on Chronic Anemia Patient Hgb stable at 7.1 this am.  -F/u FOBT -Aranesp per nephrology -continue to  monitor -transfuse for hgb < 7.0    LUL Cavitary Pneumonia COPD -DLCO 38% pred - 02/12/20 Underlying OSA Hx post-infectious scarring - no ILD on CT Cavitary PNA on CT chest. Afebrile, with improved respiratory status, on HFNC 4 L this am. Patient continues to get BiPAP prn and qhs. WBC trending down.  -wean HFNC for sats 88-94% -pulmonary hygiene  -IS, mobilize -brovana, Yupelri + PRN albuterol  -augmentin (8/14-9/4) -QHS & PRN daytime sleep bipap   Delirium Improved with BiPAP, suspect hypercarbic failure +/- hypoactive delirium. CT head neg. Alert and oriented x 3 today (person/place/year), more awake and making jokes, clear speech.    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  -diet Clear liquid w/ feeds at 60 cc/hr  -PRN tylenol  -atorvastatin   History of diastolic heart failure Echo 12/25/2019 revealed ejection fraction of 60 to 65%. Repeat echo showed normal EF with grade I diastolic dysfunction. Patient appears euvolemic on exam today.  -tele monitoring   Constipation - resolved Diarrhea - resolved Status post methylnaltrexone on 8/14 and smog enema. Patient with no stools yesterday. -bowel regimen  -Restart TF at qhs for nightly feeding -Collect FOBT when bowel movement -minimize narcotics as able   Hiccups -gabapentin 100 mg TID PRN  Best Practice (right click and "Reselect all SmartList Selections" daily)  Diet/type: Regular consistency (see orders) Renal/Carb modified w/ fluid restriction DVT prophylaxis: prophylactic heparin  GI prophylaxis: N/A Lines: yes and it is still needed, PIV x 2 Foley:  Yes, and it is still needed Code Status:  full code Last date of multidisciplinary  goals of care discussion: Patient updated 8/28.  Critical care time: 30 minutes   Mathew Bouche, MD PGY-2 [facility] 05/05/2022, 11:12 AM   Please see [email or web address] for pager details.   From 7A-7P if no response, please call [phone] After hours, please call ELink [phone]

## 2022-05-05 NOTE — Progress Notes (Signed)
Calorie Count Note  48 hour calorie count ordered.  Diet: Renal/Carbohydrate Modified 1200 ml FR Supplements: Nepro Shakes TID  Breakfast: Per RN pt ate ~ 50% of meal   Pt discussed during ICU rounds and with RN and MD.  Plan to transition to nocturnal TF and monitor calorie count.   Cortrak:  Vital 1.5 @ 85 ml/hr x 10 hours  60 ml ProSourceTF20 daily Provides: 1355 kcal, 88 grams protein  Estimated nutrition needs: Kcal: 2300-2500 Protein: 105-140 grams  Lockie Pares., RD, LDN, CNSC See AMiON for contact information

## 2022-05-05 NOTE — Progress Notes (Signed)
[facility] Progress Note Patient Name: HOSEY BURMESTER DOB: May 29, 1961 MRN: [id number]   Date of Service  05/05/2022  HPI/Events of Note  HD RN requesting TPA for catheter  eICU Interventions  Cathflo x 1 ordered     Intervention Category Minor Interventions: Other:  Shenita Trego Rodman Pickle 05/05/2022, 4:21 AM

## 2022-05-05 NOTE — Procedures (Signed)
HD Note   Data gathered may have been entered later than when it was gathered due to patient care needs.  The marked time is accurate for the data entered.

## 2022-05-05 NOTE — Progress Notes (Signed)
Occupational Therapy Treatment Patient Details Name: Mathew Lara MRN: [id number] DOB: 04/08/1961 Today's Date: 05/05/2022   History of present illness Pt is 61 yo male presenting to [facility] ED on 8/5 with abdominal pain. ED work-up revealed DKA, acute pancreatitis, AKI. CT 8/5 compatible with acute pancreatitis. Overnight 8/5 developed continuing respiratory failure and sock. Transfer to [facility]. Placed on BiPAP. Started on CRRT 04/13/22 - 04/25/22. Repeat CT abd pelvis 8/14 with edematous pancreas but no evidence of necrotizing pancreatitis. s/p TIJ TDC placed by IR on 8/29. PMH including cholecystectomy (March 23, 2021), alcohol abuse, anxiety, CKD, COPD, diabetes mellitus type 2, essential hypertension, hepatitis C, mixed hyperlipidemia.   OT comments  Pt supine in bed and agreeable to OT/PT session.  Patient completing bed mobility with mod assist (+2 to return supine), min assist +2 for transfers but mod verbal cueing for initiation and sequencing. LB dressing with max assist, grooming with setup assist.  He remains limited by decreased activity tolerance, balance, and cognition.  Will follow acutely, SNF remains appropriate.    Recommendations for follow up therapy are one component of a multi-disciplinary discharge planning process, led by the attending physician.  Recommendations may be updated based on patient status, additional functional criteria and insurance authorization.    Follow Up Recommendations  Skilled nursing-short term rehab (<3 hours/day)    Assistance Recommended at Discharge Frequent or constant Supervision/Assistance  Patient can return home with the following  A lot of help with walking and/or transfers;A lot of help with bathing/dressing/bathroom;Direct supervision/assist for medications management;Direct supervision/assist for financial management;Assist for transportation;Help with stairs or ramp for entrance;Assistance with cooking/housework   Equipment  Recommendations  Other (comment) (defer)    Recommendations for Other Services      Precautions / Restrictions Precautions Precautions: Fall Precaution Comments: cortrack Restrictions Weight Bearing Restrictions: No       Mobility Bed Mobility Overal bed mobility: Needs Assistance Bed Mobility: Rolling, Sidelying to Sit, Sit to Sidelying Rolling: Mod assist Sidelying to sit: Mod assist     Sit to sidelying: Mod assist, +2 for safety/equipment General bed mobility comments: requires cueing for sequencing, technique and increased time for initiation of tasks.  Pt with most assist for trunk to ascend and BLEs back to supine.    Transfers Overall transfer level: Needs assistance Equipment used: Rolling walker (2 wheels) Transfers: Sit to/from Stand Sit to Stand: Min assist, +2 safety/equipment, +2 physical assistance           General transfer comment: cueing for hand placement with poor carryover, increased effort and mod cueing to initiate and sequence     Balance Overall balance assessment: Needs assistance Sitting-balance support: No upper extremity supported, Feet supported Sitting balance-Leahy Scale: Fair Sitting balance - Comments: min guard to dynamically engage in LB dressing   Standing balance support: Bilateral upper extremity supported, No upper extremity supported, During functional activity Standing balance-Leahy Scale: Poor Standing balance comment: relies on UE support dynamically, able to sustain static standing without UE support during ADLs                           ADL either performed or assessed with clinical judgement   ADL Overall ADL's : Needs assistance/impaired     Grooming: Set up;Wash/dry hands;Wash/dry face;Sitting               Lower Body Dressing: Maximal assistance;+2 for safety/equipment;+2 for physical assistance;Sit to/from stand Lower Body Dressing Details (  indicate cue type and reason): max assist for socks  sitting EOB, min assist +2 safety sit to stand Toilet Transfer: Minimal assistance;+2 for safety/equipment;Rolling walker (2 wheels) Toilet Transfer Details (indicate cue type and reason): simulated side stepping at EOB           General ADL Comments: pt with decreased activity tolerance and strength    Extremity/Trunk Assessment Upper Extremity Assessment Upper Extremity Assessment: Generalized weakness            Vision       Perception     Praxis      Cognition Arousal/Alertness: Awake/alert Behavior During Therapy: WFL for tasks assessed/performed Overall Cognitive Status: Impaired/Different from baseline Area of Impairment: Attention, Following commands, Awareness, Safety/judgement, Problem solving                   Current Attention Level: Sustained   Following Commands: Follows one step commands consistently, Follows one step commands with increased time, Follows multi-step commands inconsistently Safety/Judgement: Decreased awareness of safety, Decreased awareness of deficits Awareness: Emergent Problem Solving: Slow processing, Decreased initiation, Requires verbal cues, Difficulty sequencing, Requires tactile cues General Comments: pt oriented to self and time (reports 2023 when asked for year, but after multiple cues able to voice August). Follows simple commands with increased time but requires redirection and cueing for initation.        Exercises      Shoulder Instructions       General Comments BP soft but stable    Pertinent Vitals/ Pain       Pain Assessment Pain Assessment: Faces Faces Pain Scale: No hurt Pain Intervention(s): Monitored during session  Home Living                                          Prior Functioning/Environment              Frequency  Min 2X/week        Progress Toward Goals  OT Goals(current goals can now be found in the care plan section)  Progress towards OT goals:  Progressing toward goals  Acute Rehab OT Goals Patient Stated Goal: none stated OT Goal Formulation: With patient Time For Goal Achievement: 05/11/22 Potential to Achieve Goals: Good  Plan Discharge plan remains appropriate;Frequency remains appropriate    Co-evaluation    PT/OT/SLP Co-Evaluation/Treatment: Yes Reason for Co-Treatment: For patient/therapist safety;To address functional/ADL transfers   OT goals addressed during session: ADL's and self-care      AM-PAC OT "6 Clicks" Daily Activity     Outcome Measure   Help from another person eating meals?: Total (NPO) Help from another person taking care of personal grooming?: A Little Help from another person toileting, which includes using toliet, bedpan, or urinal?: A Lot Help from another person bathing (including washing, rinsing, drying)?: A Lot Help from another person to put on and taking off regular upper body clothing?: A Lot Help from another person to put on and taking off regular lower body clothing?: A Lot 6 Click Score: 12    End of Session Equipment Utilized During Treatment: Oxygen (3L)  OT Visit Diagnosis: Unsteadiness on feet (R26.81);Other abnormalities of gait and mobility (R26.89);Muscle weakness (generalized) (M62.81);Pain   Activity Tolerance Patient tolerated treatment well   Patient Left in bed;with call bell/phone within reach;with bed alarm set   Nurse Communication Mobility status  Time: 2440-1027 OT Time Calculation (min): 31 min  Charges: OT General Charges $OT Visit: 1 Visit OT Treatments $Self Care/Home Management : 23-37 mins  [place] [facility] [phone]   Delight Stare 05/05/2022, 1:39 PM

## 2022-05-05 NOTE — Progress Notes (Signed)
Physical Therapy Treatment Patient Details Name: Mathew Lara MRN: [id number] DOB: [birth year] Today's Date: 05/05/2022   History of Present Illness Pt is 61 yo male presenting to [facility] ED on 8/5 with abdominal pain. ED work-up revealed DKA, acute pancreatitis, AKI. CT 8/5 compatible with acute pancreatitis. Overnight 8/5 developed continuing respiratory failure and sock. Transfer to [facility]. Placed on BiPAP. Started on CRRT 04/13/22 - 04/25/22. Repeat CT abd pelvis 8/14 with edematous pancreas but no evidence of necrotizing pancreatitis. s/p TIJ TDC placed by IR on 8/29. PMH including cholecystectomy (March 23, 2021), alcohol abuse, anxiety, CKD, COPD, diabetes mellitus type 2, essential hypertension, hepatitis C, mixed hyperlipidemia.    PT Comments    Pt starting to make more steady progress, still likely limited by not having adequate HD due to clotting.  Emphasis on transitions, sitting/standing balance/activity, generally safety sit to stands and pre gait activity.   Recommendations for follow up therapy are one component of a multi-disciplinary discharge planning process, led by the attending physician.  Recommendations may be updated based on patient status, additional functional criteria and insurance authorization.  Follow Up Recommendations  Skilled nursing-short term rehab (<3 hours/day) Can patient physically be transported by private vehicle: Yes   Assistance Recommended at Discharge Frequent or constant Supervision/Assistance  Patient can return home with the following Assist for transportation;Assistance with cooking/housework;Help with stairs or ramp for entrance;A lot of help with walking and/or transfers;A lot of help with bathing/dressing/bathroom   Equipment Recommendations   (TBD)    Recommendations for Other Services       Precautions / Restrictions Precautions Precautions: Fall Precaution Comments: cortrack Restrictions Weight Bearing Restrictions: No      Mobility  Bed Mobility Overal bed mobility: Needs Assistance Bed Mobility: Rolling, Sidelying to Sit, Sit to Sidelying Rolling: Mod assist Sidelying to sit: Mod assist     Sit to sidelying: Mod assist, +2 for safety/equipment General bed mobility comments: requires cueing for sequencing, technique and increased time for initiation of tasks.  Pt with most assist for trunk to ascend and BLEs back to supine.    Transfers Overall transfer level: Needs assistance Equipment used: Rolling walker (2 wheels) Transfers: Sit to/from Stand Sit to Stand: Min assist, +2 safety/equipment, +2 physical assistance           General transfer comment: cueing for hand placement with poor carryover, increased effort and mod cueing to initiate and sequence    Ambulation/Gait Ambulation/Gait assistance: Min assist             General Gait Details: marching in place with other pre-gait activity   Stairs             Wheelchair Mobility    Modified Rankin (Stroke Patients Only)       Balance Overall balance assessment: Needs assistance Sitting-balance support: No upper extremity supported, Feet supported Sitting balance-Leahy Scale: Fair Sitting balance - Comments: min guard to dynamically engage in LB dressing   Standing balance support: Bilateral upper extremity supported, No upper extremity supported, During functional activity Standing balance-Leahy Scale: Poor Standing balance comment: relies on UE support dynamically, able to sustain static standing without UE support during ADLs in a reaching activity standing x2 trials for 1-2 min each.                            Cognition Arousal/Alertness: Awake/alert Behavior During Therapy: WFL for tasks assessed/performed Overall Cognitive Status: Impaired/Different from baseline  Area of Impairment: Attention, Following commands, Awareness, Safety/judgement, Problem solving                   Current  Attention Level: Sustained   Following Commands: Follows one step commands consistently, Follows one step commands with increased time, Follows multi-step commands inconsistently Safety/Judgement: Decreased awareness of safety, Decreased awareness of deficits Awareness: Emergent Problem Solving: Slow processing, Decreased initiation, Requires verbal cues, Difficulty sequencing, Requires tactile cues General Comments: pt oriented to self and time (reports 2023 when asked for year, but after multiple cues able to voice August). Follows simple commands with increased time but requires redirection and cueing for initation.        Exercises Other Exercises Other Exercises: hip/knee flexion/ext ROM with graded assist and resistance x10 reps.    General Comments General comments (skin integrity, edema, etc.): BP soft but stable      Pertinent Vitals/Pain Pain Assessment Pain Assessment: Faces Faces Pain Scale: No hurt Pain Intervention(s): Monitored during session    Home Living                          Prior Function            PT Goals (current goals can now be found in the care plan section) Acute Rehab PT Goals PT Goal Formulation: With patient Time For Goal Achievement: 05/11/22 Potential to Achieve Goals: Good Progress towards PT goals: Progressing toward goals    Frequency    Min 3X/week      PT Plan Current plan remains appropriate;Frequency needs to be updated    Co-evaluation PT/OT/SLP Co-Evaluation/Treatment: Yes Reason for Co-Treatment: For patient/therapist safety PT goals addressed during session: Mobility/safety with mobility OT goals addressed during session: ADL's and self-care      AM-PAC PT "6 Clicks" Mobility   Outcome Measure  Help needed turning from your back to your side while in a flat bed without using bedrails?: A Lot Help needed moving from lying on your back to sitting on the side of a flat bed without using bedrails?:  Total Help needed moving to and from a bed to a chair (including a wheelchair)?: Total Help needed standing up from a chair using your arms (e.g., wheelchair or bedside chair)?: Total Help needed to walk in hospital room?: Total Help needed climbing 3-5 steps with a railing? : Total 6 Click Score: 7    End of Session Equipment Utilized During Treatment: Oxygen Activity Tolerance: Patient tolerated treatment well;Patient limited by lethargy Patient left: in bed;with call bell/phone within reach;with bed alarm set Nurse Communication: Mobility status PT Visit Diagnosis: Other abnormalities of gait and mobility (R26.89);Muscle weakness (generalized) (M62.81)     Time: 8828-0034 PT Time Calculation (min) (ACUTE ONLY): 31 min  Charges:  $Therapeutic Activity: 8-22 mins                     05/05/2022  Ginger Carne., PT Acute Rehabilitation Services [phone]  (pager) [phone]  (office)   Tessie Fass Mical Brun 05/05/2022, 2:27 PM

## 2022-05-05 NOTE — Progress Notes (Signed)
Inpatient Diabetes Program Recommendations  AACE/ADA: New Consensus Statement on Inpatient Glycemic Control (2015)  Target Ranges:  Prepandial:   less than 140 mg/dL      Peak postprandial:   less than 180 mg/dL (1-2 hours)      Critically ill patients:  140 - 180 mg/dL   Lab Results  Component Value Date   GLUCAP 139 (H) 05/05/2022   HGBA1C 12.5 (H) 04/10/2022    Review of Glycemic Control  Latest Reference Range & Units 05/04/22 19:47 05/04/22 23:33 05/05/22 03:41 05/05/22 07:18  Glucose-Capillary 70 - 99 mg/dL 154 (H) 196 (H) 129 (H) 139 (H)   Diabetes history: DM 2 Home DM Meds: Jardiance 10 mg daily     Current Orders: Levemir 38 units BID   Novolog 1-2-3 units Q4H  Novolog 13 units Q4H                         Vital 1.5 cal 70 cc/hr-HELD AT MIDNIGHT Inpatient Diabetes Program Recommendations:    Spoke to MD regarding patient. Feeds currently on hold.  Blood sugars within goal.  Will follow.   Thanks,  Adah Perl, RN, BC-ADM Inpatient Diabetes Coordinator Pager [phone]  (8a-5p)

## 2022-05-06 ENCOUNTER — Ambulatory Visit (HOSPITAL_COMMUNITY)
Admission: RE | Admit: 2022-05-06 | Discharge: 2022-05-06 | Disposition: A | Discharge status: Home / Self Care | Payer: Medicare Other | Source: Ambulatory Visit | Attending: Pulmonary Disease | Admitting: Pulmonary Disease

## 2022-05-06 DIAGNOSIS — E1110 Type 2 diabetes mellitus with ketoacidosis without coma: Secondary | ICD-10-CM | POA: Diagnosis not present

## 2022-05-06 LAB — RENAL FUNCTION PANEL
Albumin: 2.4 g/dL — ABNORMAL LOW (ref 3.5–5.0)
Anion gap: 17 — ABNORMAL HIGH (ref 5–15)
BUN: 57 mg/dL — ABNORMAL HIGH (ref 8–23)
CO2: 26 mmol/L (ref 22–32)
Calcium: 9.1 mg/dL (ref 8.9–10.3)
Chloride: 94 mmol/L — ABNORMAL LOW (ref 98–111)
Creatinine, Ser: 8.93 mg/dL — ABNORMAL HIGH (ref 0.61–1.24)
GFR, Estimated: 6 mL/min — ABNORMAL LOW (ref >60)
Glucose, Bld: 190 mg/dL — ABNORMAL HIGH (ref 70–99)
Phosphorus: 5.9 mg/dL — ABNORMAL HIGH (ref 2.5–4.6)
Potassium: 4.1 mmol/L (ref 3.5–5.1)
Sodium: 137 mmol/L (ref 135–145)

## 2022-05-06 LAB — CBC
HCT: 23.5 % — ABNORMAL LOW (ref 39.0–52.0)
Hemoglobin: 7.2 g/dL — ABNORMAL LOW (ref 13.0–17.0)
MCH: 26.9 pg (ref 26.0–34.0)
MCHC: 30.6 g/dL (ref 30.0–36.0)
MCV: 87.7 fL (ref 80.0–100.0)
Platelets: 315 10*3/uL (ref 150–400)
RBC: 2.68 MIL/uL — ABNORMAL LOW (ref 4.22–5.81)
RDW: 17.3 % — ABNORMAL HIGH (ref 11.5–15.5)
WBC: 12.2 10*3/uL — ABNORMAL HIGH (ref 4.0–10.5)
nRBC: 0 % (ref 0.0–0.2)

## 2022-05-06 LAB — IRON AND TIBC
Iron: 27 ug/dL — ABNORMAL LOW (ref 45–182)
Saturation Ratios: 9 % — ABNORMAL LOW (ref 17.9–39.5)
TIBC: 298 ug/dL (ref 250–450)
UIBC: 271 ug/dL

## 2022-05-06 LAB — GLUCOSE, CAPILLARY
Glucose-Capillary: 123 mg/dL — ABNORMAL HIGH (ref 70–99)
Glucose-Capillary: 125 mg/dL — ABNORMAL HIGH (ref 70–99)
Glucose-Capillary: 135 mg/dL — ABNORMAL HIGH (ref 70–99)
Glucose-Capillary: 138 mg/dL — ABNORMAL HIGH (ref 70–99)
Glucose-Capillary: 150 mg/dL — ABNORMAL HIGH (ref 70–99)
Glucose-Capillary: 173 mg/dL — ABNORMAL HIGH (ref 70–99)
Glucose-Capillary: 176 mg/dL — ABNORMAL HIGH (ref 70–99)

## 2022-05-06 LAB — FOLATE: Folate: 23.7 ng/mL (ref >5.9)

## 2022-05-06 LAB — FERRITIN: Ferritin: 1477 ng/mL — ABNORMAL HIGH (ref 24–336)

## 2022-05-06 LAB — RETICULOCYTES
Immature Retic Fract: 24 % — ABNORMAL HIGH (ref 2.3–15.9)
RBC.: 2.69 MIL/uL — ABNORMAL LOW (ref 4.22–5.81)
Retic Count, Absolute: 67.8 10*3/uL (ref 19.0–186.0)
Retic Ct Pct: 2.5 % (ref 0.4–3.1)

## 2022-05-06 LAB — OCCULT BLOOD X 1 CARD TO LAB, STOOL: Fecal Occult Bld: NEGATIVE

## 2022-05-06 LAB — MAGNESIUM: Magnesium: 1.9 mg/dL (ref 1.7–2.4)

## 2022-05-06 LAB — VITAMIN B12: Vitamin B-12: 571 pg/mL (ref 180–914)

## 2022-05-06 MED ORDER — SEVELAMER CARBONATE 800 MG PO TABS
800.0000 mg | ORAL_TABLET | Freq: Three times a day (TID) | ORAL | Status: DC
  Administered 2022-05-06 – 2022-05-07 (×3): 800 mg via ORAL
  Filled 2022-05-06 (×3): qty 1

## 2022-05-06 MED ORDER — INSULIN DETEMIR 100 UNIT/ML ~~LOC~~ SOLN
45.0000 [IU] | Freq: Two times a day (BID) | SUBCUTANEOUS | Status: DC
Start: 2022-05-06 — End: 2022-05-06
  Filled 2022-05-06 (×2): qty 0.45

## 2022-05-06 MED ORDER — INSULIN ASPART 100 UNIT/ML IJ SOLN
13.0000 [IU] | INTRAMUSCULAR | Status: DC
Start: 2022-05-06 — End: 2022-05-07

## 2022-05-06 MED ORDER — INSULIN DETEMIR 100 UNIT/ML ~~LOC~~ SOLN
32.0000 [IU] | Freq: Two times a day (BID) | SUBCUTANEOUS | Status: DC
Start: 2022-05-06 — End: 2022-05-13
  Administered 2022-05-07 – 2022-05-13 (×13): 32 [IU] via SUBCUTANEOUS
  Filled 2022-05-06 (×20): qty 0.32

## 2022-05-06 MED ORDER — INSULIN ASPART 100 UNIT/ML IJ SOLN: 13.0000 [IU] | INTRAMUSCULAR | Status: DC

## 2022-05-06 MED ORDER — QUETIAPINE FUMARATE 25 MG PO TABS
25.0000 mg | ORAL_TABLET | Freq: Every day | ORAL | Status: DC
  Filled 2022-05-06: qty 1

## 2022-05-06 MED ORDER — INSULIN ASPART 100 UNIT/ML IJ SOLN
1.0000 [IU] | INTRAMUSCULAR | Status: DC
  Administered 2022-05-06: 1 [IU] via SUBCUTANEOUS
  Administered 2022-05-06: 2 [IU] via SUBCUTANEOUS
  Administered 2022-05-06 – 2022-05-07 (×3): 1 [IU] via SUBCUTANEOUS
  Administered 2022-05-07 (×2): 2 [IU] via SUBCUTANEOUS
  Administered 2022-05-08: 1 [IU] via SUBCUTANEOUS
  Administered 2022-05-08: 2 [IU] via SUBCUTANEOUS
  Administered 2022-05-08: 1 [IU] via SUBCUTANEOUS
  Administered 2022-05-08: 2 [IU] via SUBCUTANEOUS
  Administered 2022-05-08: 1 [IU] via SUBCUTANEOUS

## 2022-05-06 NOTE — Progress Notes (Signed)
PROGRESS NOTE   Mathew Lara  ZES:[id number] DOB: Dec 31, 1960 DOA: 04/10/2022 PCP: [name], MD  Brief Narrative:  61 year old black male known history of EtOH hep C HLD DM TY 2 HTN COPD Initial presentation [facility] 04/10/2022 epigastric pain radiating down lower abdomen cramping in nature associated with nausea--findings consistent with acute pancreatitis when admitted ./Creatinine was 41/3.0-18 8/6 overnight developed respiratory distress as well as shock placed on Levophed DKA was being treated 8/7 On pressors  8/8 BiPAP, worsening renal parameters 8/9 Tolerating CRRT, on BiPAP 8/10 has been off BiPAP, on high flow oxygen and tolerating 8/11 UOP improving. CRRT with net even UF 8/14 CT reveals worsening cavitary pneumonia and acute pancreatitis, large BM 8/15 abx changed for cavitary pneumonia 8/16 ID consult.  ID stopped vancomycin, Zosyn, azithromycin >narrowed to augmentin. CRRT stopped.   8/17 CRRT restarted.  Off pressors but placed on very high-dose midodrine 8/19 Remains off pressors, CRRT ongoing. HD cath required tPA.  8/21 iHD, off CRRT 8/28 transfused 1 unit PRBC 8/29 patient had Tunneled HD cath placed, core track placed for nutrition and calorie count started--- patient was turndown for LTAC after peer to peer by critical care service   Hospital-Problem based course  Vasoplegia secondary to chronic anemia critical illness ongoing renal failure Ischemic ATN AKI from pancreatitis/shock superimposed on CKD 4 on admission leading to IHD MWF (now anuric) Does require midodrine high doses of 40 mg 3 times daily-Jardiance losartan Kerendia discontinued-IHD started 8/21 IJ TDC by IR on 2/63--FHL complications with cannulation on 8/30- permanent access placed on 8/31 L chest Continue iron mass 60 q. Monday, Rena-Vite Phosphorus slightly elevated 5.9 start Renvela Acidosis to be managed dialysis and renal physician  multilobular pneumonia with acute respiratory failure  8/14 Underlying COPD OSA Requiring 3 weeks total Augmentin 250 ending on 05/09/2022 to be repeated on-CT chest to be repeated 9/10 Attempt to wean high flow nasal cannula to off Continue albuterol every 4 as needed 15 twice daily Yupelri 175 daily saline nasal spray Still has persisting leukocytosis  Acute pancreatitis on admission DKA on Admission and underlying DM TY 2 Tube feeds weaned off on 8/30-now on oral diet tolerating about 30-40% of it We will cut back Levemir 45 units-->32 U, scheduled insulin 13 units 3 times daily and sliding scale CBGs currently ranging 1 23-200 Discontinue D10 but monitor trends of sugars  Anemia of critical illness and related to chronic ethanol FOBT is pending, stool reported to be mushy and brown-check iron stores--transfuse X1 on 8/20 Does not meet transfusion threshold as yet  Underlying EtOH, hep C (per patient treated) Check viral loads will need outpatient follow-up with regards to this  probable ICU delirium  Now resolved-cutting back Seroquel from 50-25 discontinue gabapentin discontinue opiates  Severe protein energy malnutrition Calorie count in process-discontinue tube with eating adequate calories of more than 50 to 60% of meals  DVT prophylaxis: SCD Code Status: Full Family Communication: None currently at bedside Disposition:  Status is: Inpatient Remains inpatient appropriate because:   Need to ensure hemodynamic stability as well as will need clip and eventual placement to dialysis center   Consultants:  None  Procedures: No  Antimicrobials: Augmentin   Subjective: Sitting in chair-nursing reports somewhat confused at times does not completely answer orienting questions but to me today he is telling me the year, the month can tell me who the president is knows that he lives in [place] He still has his core track in place  Objective: Vitals:  05/06/22 0000 05/06/22 0015 05/06/22 0425 05/06/22 0500  BP: (!) 87/35  92/63    Pulse: (!) 119 (!) 118    Resp: (!) 27 (!) 27    Temp:   98.6 F (37 C)   TempSrc:   Oral   SpO2: 93% 95%    Weight:    119.1 kg  Height:        Intake/Output Summary (Last 24 hours) at 05/06/2022 0740 Last data filed at 05/05/2022 1800 Gross per 24 hour  Intake 495 ml  Output 1650 ml  Net -1155 ml   Filed Weights   05/04/22 0437 05/05/22 0227 05/06/22 0500  Weight: 123.3 kg 123.3 kg 119.1 kg    Examination:  Awake obese black male moderate dentition no JVD no icterus no pallor No LAM Chest access in place-slightly tachycardic with movement 110 115 range Decreased air entry posterolaterally on the left side ROM intact power 5/5 No icterus no pallor Slight firmness subcutaneously in lower right quadrant No rebound no guarding Quite weak in lower extremities    Data Reviewed: personally reviewed   CBC    Component Value Date/Time   WBC 12.2 (H) 05/06/2022 0041   RBC 2.68 (L) 05/06/2022 0041   HGB 7.2 (L) 05/06/2022 0041   HGB 14.0 03/16/2022 1050   HCT 23.5 (L) 05/06/2022 0041   HCT 42.4 03/16/2022 1050   PLT 315 05/06/2022 0041   PLT 258 03/16/2022 1050   MCV 87.7 05/06/2022 0041   MCV 80 03/16/2022 1050   MCH 26.9 05/06/2022 0041   MCHC 30.6 05/06/2022 0041   RDW 17.3 (H) 05/06/2022 0041   RDW 16.5 (H) 03/16/2022 1050   LYMPHSABS 1.4 04/21/2022 0559   LYMPHSABS 1.0 03/16/2022 1050   MONOABS 1.4 (H) 04/21/2022 0559   EOSABS 0.3 04/21/2022 0559   EOSABS 0.4 03/16/2022 1050   BASOSABS 0.1 04/21/2022 0559   BASOSABS 0.0 03/16/2022 1050      Latest Ref Rng & Units 05/05/2022    2:20 AM 05/04/2022    8:29 AM 05/03/2022    9:20 AM  CMP  Glucose 70 - 99 mg/dL 175  132  150   BUN 8 - 23 mg/dL 100  95  81   Creatinine 0.61 - 1.24 mg/dL 13.23  12.73  11.30   Sodium 135 - 145 mmol/L 135  134  135   Potassium 3.5 - 5.1 mmol/L 4.7  5.0  5.1   Chloride 98 - 111 mmol/L 94  93  92   CO2 22 - 32 mmol/L '22  22  22   '$ Calcium 8.9 - 10.3 mg/dL 9.3  9.8  10.1       Radiology Studies: DG CHEST PORT 1 VIEW  Result Date: 05/05/2022 CLINICAL DATA: Complication associated with dialysis catheter. EXAM: PORTABLE CHEST 1 VIEW COMPARISON:  05/02/2022 FINDINGS: Left dialysis catheter tip is in the SVC. Cardiomegaly with vascular congestion. Chronic changes in the left lung again noted. Left upper lobe thick walled cavitary area add not as well visualized as on prior CT. No effusions. No acute bony abnormality. IMPRESSION: Left dialysis catheter tip in the SVC. Otherwise no change.  No acute cardiopulmonary disease. Electronically Signed   By: Rolm Baptise M.D.   On: 05/05/2022 09:22   IR Fluoro Guide CV Line Left  Result Date: 05/04/2022 CLINICAL DATA:  End-stage renal disease and need for tunneled hemodialysis catheter. Recent placement and removal of temporary dialysis catheter via right internal jugular vein. EXAM: TUNNELED CENTRAL VENOUS  HEMODIALYSIS CATHETER PLACEMENT WITH ULTRASOUND AND FLUOROSCOPIC GUIDANCE ANESTHESIA/SEDATION: Moderate (conscious) sedation was employed during this procedure. A total of Versed 1.5 mg and Fentanyl 50 mcg was administered intravenously by radiology nursing. Moderate Sedation Time: 47 minutes. The patient's level of consciousness and vital signs were monitored continuously by radiology nursing throughout the procedure under my direct supervision. MEDICATIONS: 2 g IV Ancef. FLUOROSCOPY: 3 minutes and 42 seconds.  282 mGy. PROCEDURE: The procedure, risks, benefits, and alternatives were explained to the patient. Questions regarding the procedure were encouraged and answered. The patient understands and consents to the procedure. A timeout was performed prior to initiating the procedure. The right neck and chest were prepped with chlorhexidine in a sterile fashion, and a sterile drape was applied covering the operative field. Maximum barrier sterile technique with sterile gowns and gloves were used for the procedure. Local anesthesia  was provided with 1% lidocaine. During the procedure the left neck and chest were also prepped with chlorhexidine and draped. Ultrasound was performed of the right internal jugular vein and a permanent ultrasound image was recorded and saved. Initial puncture of the right internal jugular vein was performed under ultrasound guidance. Guidewire passage was attempted under fluoroscopy. Needle and wire were removed. Ultrasound was performed of the left internal jugular vein and a permanent ultrasound image was recorded and saved. After creating a small venotomy incision, a 21 gauge needle was advanced into the left internal jugular vein under direct, real-time ultrasound guidance. Ultrasound image documentation was performed. After securing guidewire access, an 8 Fr dilator was placed. A J-wire was kinked to measure appropriate catheter length. A Palindrome tunneled hemodialysis catheter measuring 23 cm from tip to cuff was chosen for placement. This was tunneled in a retrograde fashion from the chest wall to the venotomy incision. At the venotomy, serial dilatation was performed and a 15 Fr peel-away sheath was placed over a guidewire. The catheter was then placed through the sheath and the sheath removed. Final catheter positioning was confirmed and documented with a fluoroscopic spot image. The catheter was aspirated, flushed with saline, and injected with appropriate volume heparin dwells. The venotomy incision was closed with subcuticular 4-0 Vicryl. Dermabond was applied to the incision. The catheter exit site was secured with 0-Prolene retention sutures. COMPLICATIONS: None.  No pneumothorax. FINDINGS: Initial ultrasound demonstrates thrombus in the right internal jugular vein. Due to what appear to be initially slow flow, access of the vein was attempted but a guidewire would not pass below the clavicle. Additional ultrasound demonstrates occlusive and chronic appearing thrombus in the right internal jugular  vein related to prior temporary dialysis catheter placement. The left internal jugular vein is normally patent. A tunneled dialysis catheter was placed via the left internal jugular vein successfully. After catheter placement, the tip lies in the right atrium. The catheter aspirates normally and is ready for immediate use. IMPRESSION: 1. 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 2. Placement of tunneled hemodialysis catheter via the left internal jugular vein. The catheter tip lies in the right atrium. The catheter is ready for immediate use. Electronically Signed   By: Aletta Edouard M.D.   On: 05/04/2022 15:15   IR US Guide Vasc Access Left  Result Date: 05/04/2022 CLINICAL DATA:  End-stage renal disease and need for tunneled hemodialysis catheter. Recent placement and removal of temporary dialysis catheter via right internal jugular vein. EXAM: TUNNELED CENTRAL VENOUS HEMODIALYSIS CATHETER PLACEMENT WITH ULTRASOUND AND FLUOROSCOPIC  GUIDANCE ANESTHESIA/SEDATION: Moderate (conscious) sedation was employed during this procedure. A total of Versed 1.5 mg and Fentanyl 50 mcg was administered intravenously by radiology nursing. Moderate Sedation Time: 47 minutes. The patient's level of consciousness and vital signs were monitored continuously by radiology nursing throughout the procedure under my direct supervision. MEDICATIONS: 2 g IV Ancef. FLUOROSCOPY: 3 minutes and 42 seconds.  282 mGy. PROCEDURE: The procedure, risks, benefits, and alternatives were explained to the patient. Questions regarding the procedure were encouraged and answered. The patient understands and consents to the procedure. A timeout was performed prior to initiating the procedure. The right neck and chest were prepped with chlorhexidine in a sterile fashion, and a sterile drape was applied covering the operative field. Maximum barrier sterile technique with  sterile gowns and gloves were used for the procedure. Local anesthesia was provided with 1% lidocaine. During the procedure the left neck and chest were also prepped with chlorhexidine and draped. Ultrasound was performed of the right internal jugular vein and a permanent ultrasound image was recorded and saved. Initial puncture of the right internal jugular vein was performed under ultrasound guidance. Guidewire passage was attempted under fluoroscopy. Needle and wire were removed. Ultrasound was performed of the left internal jugular vein and a permanent ultrasound image was recorded and saved. After creating a small venotomy incision, a 21 gauge needle was advanced into the left internal jugular vein under direct, real-time ultrasound guidance. Ultrasound image documentation was performed. After securing guidewire access, an 8 Fr dilator was placed. A J-wire was kinked to measure appropriate catheter length. A Palindrome tunneled hemodialysis catheter measuring 23 cm from tip to cuff was chosen for placement. This was tunneled in a retrograde fashion from the chest wall to the venotomy incision. At the venotomy, serial dilatation was performed and a 15 Fr peel-away sheath was placed over a guidewire. The catheter was then placed through the sheath and the sheath removed. Final catheter positioning was confirmed and documented with a fluoroscopic spot image. The catheter was aspirated, flushed with saline, and injected with appropriate volume heparin dwells. The venotomy incision was closed with subcuticular 4-0 Vicryl. Dermabond was applied to the incision. The catheter exit site was secured with 0-Prolene retention sutures. COMPLICATIONS: None.  No pneumothorax. FINDINGS: Initial ultrasound demonstrates thrombus in the right internal jugular vein. Due to what appear to be initially slow flow, access of the vein was attempted but a guidewire would not pass below the clavicle. Additional ultrasound demonstrates  occlusive and chronic appearing thrombus in the right internal jugular vein related to prior temporary dialysis catheter placement. The left internal jugular vein is normally patent. A tunneled dialysis catheter was placed via the left internal jugular vein successfully. After catheter placement, the tip lies in the right atrium. The catheter aspirates normally and is ready for immediate use. IMPRESSION: 1. 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 2. Placement of tunneled hemodialysis catheter via the left internal jugular vein. The catheter tip lies in the right atrium. The catheter is ready for immediate use. Electronically Signed   By: Aletta Edouard M.D.   On: 05/04/2022 15:15   DG Abd Portable 1V  Result Date: 05/04/2022 CLINICAL DATA:  Feeding tube placement EXAM: PORTABLE ABDOMEN - 1 VIEW COMPARISON:  Radiograph 05/02/2019 FINDINGS: Feeding tube tip overlies the distal descending duodenum, retracted since the prior exam. Nonobstructive bowel gas pattern. IMPRESSION: Feeding tube tip overlies the distal descending duodenum, retracted since  the prior exam. Electronically Signed   By: Maurine Simmering M.D.   On: 05/04/2022 11:29     Scheduled Meds:  sodium chloride   Intravenous Once   amoxicillin-clavulanate  500 mg Per Tube Q12H   arformoterol  15 mcg Nebulization BID   atorvastatin  80 mg Per Tube Daily   Chlorhexidine Gluconate Cloth  6 each Topical Daily   Chlorhexidine Gluconate Cloth  6 each Topical Q0600   darbepoetin (ARANESP) injection - DIALYSIS  60 mcg Intravenous Q Mon-HD   feeding supplement (NEPRO CARB STEADY)  237 mL Oral TID BM   feeding supplement (PROSource TF20)  70 mL Per Tube Daily   heparin  5,000 Units Subcutaneous Q8H   insulin aspart  1-3 Units Subcutaneous Q4H   insulin aspart  13 Units Subcutaneous Q4H   insulin detemir  45 Units Subcutaneous Q12H   midodrine  40 mg Per Tube Q8H    multivitamin  1 tablet Per Tube QHS   mouth rinse  15 mL Mouth Rinse 4 times per day   pantoprazole  40 mg Per Tube BID   QUEtiapine  50 mg Per Tube QHS   revefenacin  175 mcg Nebulization Daily   simethicone  80 mg Per Tube QID   sodium chloride flush  10-40 mL Intracatheter Q12H   Continuous Infusions:  sodium chloride Stopped (05/02/22 1645)   sodium chloride     dextrose     feeding supplement (VITAL 1.5 CAL) Stopped (05/06/22 0330)     LOS: 26 days   Time spent: [place], MD [facility] To contact the attending provider between 7A-7P or the covering provider during after hours 7P-7A, please log into the web site [email or web address] and access using universal [place] password for that web site. If you do not have the password, please call the hospital operator.  05/06/2022, 7:40 AM

## 2022-05-06 NOTE — Progress Notes (Addendum)
In patient's room preparing 2200 medications when patient stated, "I gotta throw up". This RN turned around to help patient and witnessed him vomit up a large amount of undigested food and bile, as well as some tan tube feed. The insertion end of patient's cortrak was also thrown up and was hanging out of patient's mouth. This RN cut bridle of cortrak and pulled it out from pt's nare. Cortrak placed in bag with guidewire and pick in pt's room. TF stopped. Was not able to administer any of pt's per tube medications for 2200. Insulin Levemir 32 units and Novolog 13 units not given due to TF being held. Sent text page to [facility] MD V. Rathore at approx. [phone], awaiting further instruction.   0015 received call back from Dr. Marlowe Sax. Per verbal order, midodrine '40mg'$  given orally, 32 units levemir given, and 13 units novolog held. All other oral meds documented as not given.

## 2022-05-06 NOTE — Progress Notes (Signed)
Calorie Count Note  48 hour calorie count ordered.  Spoke with pt who was awake when I first entered the room but immediately fell asleep. He would wake but fall asleep quickly.  Will continue nocturnal TF for now and encourage oral intake.  Per RN po intake has been 10-25% of his meals.   Diet: Renal/Carb Modified, 1200 ml fluid restriction  Supplements: Nepro Shakes TID  Cortrak:  Vital 1.5 @ 85 ml/hr x 10 hours  60 ml ProSourceTF20 daily Provides: 1355 kcal, 88 grams protein  Breakfast: 230 kcal, 10 grams protein Lunch: 150 kcal and 8  grams protein Dinner: 150 kcal and 6 grams protein   Estimated nutrition needs: Kcal: 2300-2500 Protein: 105-140 grams  Total intake: 530 kcal (35% of minimum estimated needs)  24 grams protein (25% of minimum estimated needs)   Lockie Pares., RD, LDN, CNSC See AMiON for contact information

## 2022-05-06 NOTE — Progress Notes (Signed)
Patient continues to use BIPAP QHS

## 2022-05-06 NOTE — Progress Notes (Signed)
[facility] NEPHROLOGY PROGRESS NOTE  Assessment/ Plan:  # Anuric dialysis dependent AKI/CKD stage IIIb-IV - presumably ischemic ATN in setting of acute pancreatitis, hypotension, pressor support, with concomitant ARB, SGLT-2 inhibitor and finerenone.  Initially non-oliguric but now oligo-anuric.  -Continue to hold losartan, Jardiance, and Kerendia.   -Started on CRRT 04/13/22 - 04/25/22.  Started iHD 8/21. -s/p TIJ TDC placed by IR on 8/29. -Status post HD yesterday with 1.4 L ultrafiltration.  Noted urine output 250 cc.  Continue strict ins and outs and daily lab to monitor renal function.  Assess tomorrow morning  for dialysis need.  # Acute pancreatitis: resolved   # Shock -on off pressors per PCCM; on very high dose of midodrine.  #Acute hypoxic respiratory failure - with underlying COPD and OSA.  Currently on oxygen via nasal cannula.  #Hep C with advanced liver fibrosis.  #Chronic diastolic CHF - stable.  #Anemia; transfuse prn.  Iron saturation 12 with high ferritin because of inflammatory state.  Started ESA, receiving blood transfusion intermittently.  Subjective: Seen and examined at the bedside.  He is more alert awake today and able to eat with help.  Urine output charted 250 cc.  On 2 to 4 L of oxygen by nasal cannula.  Discussed with ICU nurse.  Objective Vital signs in last 24 hours: Vitals:   05/06/22 0500 05/06/22 0700 05/06/22 0800 05/06/22 0845  BP:  110/83 110/81   Pulse:  98 (!) 105   Resp:  (!) 22 18   Temp:  98.5 F (36.9 C)    TempSrc:  Oral    SpO2:  100% 100% 98%  Weight: 119.1 kg     Height:       Weight change: -4.2 kg  Intake/Output Summary (Last 24 hours) at 05/06/2022 1011 Last data filed at 05/06/2022 0800 Gross per 24 hour  Intake 495 ml  Output 1400 ml  Net -905 ml        Labs: RENAL PANEL Recent Labs    04/25/22 0255 04/25/22 0534 04/26/22 0356 04/26/22 1816 04/27/22 0516 04/27/22 1536 04/28/22 0338  04/28/22 1550 04/29/22 0332 04/29/22 1515 04/30/22 0317 04/30/22 1255 04/30/22 1725 04/30/22 2114 05/01/22 0600 05/01/22 0946 05/02/22 0045 05/02/22 0051 05/03/22 0111 05/03/22 0920 05/04/22 0044 05/04/22 0829 05/05/22 0220 05/06/22 0041  NA 134*   < > 133* 132* 131* 134* 133* 131* 128*   < > 131* 134* 135 132* 132* 133* 133*  --   --  135  --  134* 135 137  K 4.7   < > 5.3* 3.9 4.4 4.5 4.7 4.1 4.4   < > 4.1 3.4* 3.8 3.9 4.0 4.0 4.5  --   --  5.1  --  5.0 4.7 4.1  CL 98  --  97* 93* 94* 95* 95* 91* 89*   < > 91* 91* 91* 91* 91* 91* 92*  --   --  92*  --  93* 94* 94*  CO2 24  --  20* '26 26 25 23 24 24   '$ < > '24 27 28 24 24 24 '$ 20*  --   --  22  --  '22 22 26  '$ GLUCOSE 212*  --  179* 173* 197* 257* 290* 305* 417*   < > 235* 155* 202* 178* 189* 223* 241*  --   --  150*  --  132* 175* 190*  BUN 33*  --  44* 26* 36* 46* 58* 33* 42*   < > 60*  22 27* 31* 41* 40* 58*  --   --  81*  --  95* 100* 57*  CREATININE 3.86*  --  5.40* 3.94* 5.69* 6.89* 8.60* 5.69* 7.13*   < > 9.21* 4.25* 5.32* 5.65* 6.77* 7.24* 8.72*  --   --  11.30*  --  12.73* 13.23* 8.93*  CALCIUM 9.6  --  9.7 8.9 8.9 9.4 9.4 9.0 9.0   < > 9.4 9.2 9.6 9.6 9.7 9.7 9.7  --   --  10.1  --  9.8 9.3 9.1  MG 2.9*  --  2.8*  --  2.3  --  2.6*  --  2.1  --  2.4  --   --   --  1.9  --   --  2.2 2.4  --  2.4  --  2.2 1.9  PHOS 3.8  --  4.4 3.1 5.5* 6.2* 6.6* 4.9* 5.3*  --   --   --   --   --   --   --  6.3*  --   --   --   --   --   --  5.9*  ALBUMIN 2.3*  --  2.3* 2.3* 2.3* 2.3* 2.4* 2.4* 2.2*  --   --   --   --   --   --   --  2.6*  --   --   --   --   --   --  2.4*   < > = values in this interval not displayed.      Liver Function Tests: Recent Labs  Lab 05/02/22 0045 05/06/22 0041  ALBUMIN 2.6* 2.4*    No results for input(s): "LIPASE", "AMYLASE" in the last 168 hours. No results for input(s): "AMMONIA" in the last 168 hours. CBC: Recent Labs    05/01/22 0600 05/01/22 0946 05/02/22 0051 05/03/22 0111 05/04/22 0044  05/04/22 0829 05/05/22 0220 05/06/22 0041  HGB 7.7*  --    < > 7.4* 6.7* 7.3* 7.1* 7.2*  MCV 84.3  --    < > 86.3 86.3  --  86.3 87.7  FERRITIN 1,149*  --   --   --   --   --   --   --   TIBC  --  284  --   --   --   --   --   --   IRON  --  35*  --   --   --   --   --   --    < > = values in this interval not displayed.     Cardiac Enzymes: No results for input(s): "CKTOTAL", "CKMB", "CKMBINDEX", "TROPONINI" in the last 168 hours. CBG: Recent Labs  Lab 05/05/22 1531 05/05/22 2001 05/05/22 2346 05/06/22 0339 05/06/22 0740  GLUCAP 124* 218* 186* 138* 123*     Iron Studies:  No results for input(s): "IRON", "TIBC", "TRANSFERRIN", "FERRITIN" in the last 72 hours.  Studies/Results: DG CHEST PORT 1 VIEW  Result Date: 05/05/2022 CLINICAL DATA: Complication associated with dialysis catheter. EXAM: PORTABLE CHEST 1 VIEW COMPARISON:  05/02/2022 FINDINGS: Left dialysis catheter tip is in the SVC. Cardiomegaly with vascular congestion. Chronic changes in the left lung again noted. Left upper lobe thick walled cavitary area add not as well visualized as on prior CT. No effusions. No acute bony abnormality. IMPRESSION: Left dialysis catheter tip in the SVC. Otherwise no change.  No acute cardiopulmonary disease. Electronically Signed   By: Rolm Baptise M.D.  On: 05/05/2022 09:22   IR Fluoro Guide CV Line Left  Result Date: 05/04/2022 CLINICAL DATA:  End-stage renal disease and need for tunneled hemodialysis catheter. Recent placement and removal of temporary dialysis catheter via right internal jugular vein. EXAM: TUNNELED CENTRAL VENOUS HEMODIALYSIS CATHETER PLACEMENT WITH ULTRASOUND AND FLUOROSCOPIC GUIDANCE ANESTHESIA/SEDATION: Moderate (conscious) sedation was employed during this procedure. A total of Versed 1.5 mg and Fentanyl 50 mcg was administered intravenously by radiology nursing. Moderate Sedation Time: 47 minutes. The patient's level of consciousness and vital signs were  monitored continuously by radiology nursing throughout the procedure under my direct supervision. MEDICATIONS: 2 g IV Ancef. FLUOROSCOPY: 3 minutes and 42 seconds.  282 mGy. PROCEDURE: The procedure, risks, benefits, and alternatives were explained to the patient. Questions regarding the procedure were encouraged and answered. The patient understands and consents to the procedure. A timeout was performed prior to initiating the procedure. The right neck and chest were prepped with chlorhexidine in a sterile fashion, and a sterile drape was applied covering the operative field. Maximum barrier sterile technique with sterile gowns and gloves were used for the procedure. Local anesthesia was provided with 1% lidocaine. During the procedure the left neck and chest were also prepped with chlorhexidine and draped. Ultrasound was performed of the right internal jugular vein and a permanent ultrasound image was recorded and saved. Initial puncture of the right internal jugular vein was performed under ultrasound guidance. Guidewire passage was attempted under fluoroscopy. Needle and wire were removed. Ultrasound was performed of the left internal jugular vein and a permanent ultrasound image was recorded and saved. After creating a small venotomy incision, a 21 gauge needle was advanced into the left internal jugular vein under direct, real-time ultrasound guidance. Ultrasound image documentation was performed. After securing guidewire access, an 8 Fr dilator was placed. A J-wire was kinked to measure appropriate catheter length. A Palindrome tunneled hemodialysis catheter measuring 23 cm from tip to cuff was chosen for placement. This was tunneled in a retrograde fashion from the chest wall to the venotomy incision. At the venotomy, serial dilatation was performed and a 15 Fr peel-away sheath was placed over a guidewire. The catheter was then placed through the sheath and the sheath removed. Final catheter positioning was  confirmed and documented with a fluoroscopic spot image. The catheter was aspirated, flushed with saline, and injected with appropriate volume heparin dwells. The venotomy incision was closed with subcuticular 4-0 Vicryl. Dermabond was applied to the incision. The catheter exit site was secured with 0-Prolene retention sutures. COMPLICATIONS: None.  No pneumothorax. FINDINGS: Initial ultrasound demonstrates thrombus in the right internal jugular vein. Due to what appear to be initially slow flow, access of the vein was attempted but a guidewire would not pass below the clavicle. Additional ultrasound demonstrates occlusive and chronic appearing thrombus in the right internal jugular vein related to prior temporary dialysis catheter placement. The left internal jugular vein is normally patent. A tunneled dialysis catheter was placed via the left internal jugular vein successfully. After catheter placement, the tip lies in the right atrium. The catheter aspirates normally and is ready for immediate use. IMPRESSION: 1. 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 2. Placement of tunneled hemodialysis catheter via the left internal jugular vein. The catheter tip lies in the right atrium. The catheter is ready for immediate use. Electronically Signed   By: Aletta Edouard M.D.   On: 05/04/2022 15:15   IR US  Guide Vasc Access Left  Result Date: 05/04/2022 CLINICAL DATA:  End-stage renal disease and need for tunneled hemodialysis catheter. Recent placement and removal of temporary dialysis catheter via right internal jugular vein. EXAM: TUNNELED CENTRAL VENOUS HEMODIALYSIS CATHETER PLACEMENT WITH ULTRASOUND AND FLUOROSCOPIC GUIDANCE ANESTHESIA/SEDATION: Moderate (conscious) sedation was employed during this procedure. A total of Versed 1.5 mg and Fentanyl 50 mcg was administered intravenously by radiology nursing. Moderate Sedation Time: 47  minutes. The patient's level of consciousness and vital signs were monitored continuously by radiology nursing throughout the procedure under my direct supervision. MEDICATIONS: 2 g IV Ancef. FLUOROSCOPY: 3 minutes and 42 seconds.  282 mGy. PROCEDURE: The procedure, risks, benefits, and alternatives were explained to the patient. Questions regarding the procedure were encouraged and answered. The patient understands and consents to the procedure. A timeout was performed prior to initiating the procedure. The right neck and chest were prepped with chlorhexidine in a sterile fashion, and a sterile drape was applied covering the operative field. Maximum barrier sterile technique with sterile gowns and gloves were used for the procedure. Local anesthesia was provided with 1% lidocaine. During the procedure the left neck and chest were also prepped with chlorhexidine and draped. Ultrasound was performed of the right internal jugular vein and a permanent ultrasound image was recorded and saved. Initial puncture of the right internal jugular vein was performed under ultrasound guidance. Guidewire passage was attempted under fluoroscopy. Needle and wire were removed. Ultrasound was performed of the left internal jugular vein and a permanent ultrasound image was recorded and saved. After creating a small venotomy incision, a 21 gauge needle was advanced into the left internal jugular vein under direct, real-time ultrasound guidance. Ultrasound image documentation was performed. After securing guidewire access, an 8 Fr dilator was placed. A J-wire was kinked to measure appropriate catheter length. A Palindrome tunneled hemodialysis catheter measuring 23 cm from tip to cuff was chosen for placement. This was tunneled in a retrograde fashion from the chest wall to the venotomy incision. At the venotomy, serial dilatation was performed and a 15 Fr peel-away sheath was placed over a guidewire. The catheter was then placed through  the sheath and the sheath removed. Final catheter positioning was confirmed and documented with a fluoroscopic spot image. The catheter was aspirated, flushed with saline, and injected with appropriate volume heparin dwells. The venotomy incision was closed with subcuticular 4-0 Vicryl. Dermabond was applied to the incision. The catheter exit site was secured with 0-Prolene retention sutures. COMPLICATIONS: None.  No pneumothorax. FINDINGS: Initial ultrasound demonstrates thrombus in the right internal jugular vein. Due to what appear to be initially slow flow, access of the vein was attempted but a guidewire would not pass below the clavicle. Additional ultrasound demonstrates occlusive and chronic appearing thrombus in the right internal jugular vein related to prior temporary dialysis catheter placement. The left internal jugular vein is normally patent. A tunneled dialysis catheter was placed via the left internal jugular vein successfully. After catheter placement, the tip lies in the right atrium. The catheter aspirates normally and is ready for immediate use. IMPRESSION: 1. 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 2. Placement of tunneled hemodialysis catheter via the left internal jugular vein. The catheter tip lies in the right atrium. The catheter is ready for immediate use. Electronically Signed   By: Aletta Edouard M.D.   On: 05/04/2022 15:15   DG Abd Portable 1V  Result Date: 05/04/2022 CLINICAL  DATA:  Feeding tube placement EXAM: PORTABLE ABDOMEN - 1 VIEW COMPARISON:  Radiograph 05/02/2019 FINDINGS: Feeding tube tip overlies the distal descending duodenum, retracted since the prior exam. Nonobstructive bowel gas pattern. IMPRESSION: Feeding tube tip overlies the distal descending duodenum, retracted since the prior exam. Electronically Signed   By: Maurine Simmering M.D.   On: 05/04/2022 11:29    Medications: Infusions:   sodium chloride Stopped (05/02/22 1645)   sodium chloride     feeding supplement (VITAL 1.5 CAL) Stopped (05/06/22 0330)    Scheduled Medications:  sodium chloride   Intravenous Once   amoxicillin-clavulanate  500 mg Per Tube Q12H   arformoterol  15 mcg Nebulization BID   atorvastatin  80 mg Per Tube Daily   Chlorhexidine Gluconate Cloth  6 each Topical Daily   Chlorhexidine Gluconate Cloth  6 each Topical Q0600   darbepoetin (ARANESP) injection - DIALYSIS  60 mcg Intravenous Q Mon-HD   feeding supplement (NEPRO CARB STEADY)  237 mL Oral TID BM   feeding supplement (PROSource TF20)  70 mL Per Tube Daily   heparin  5,000 Units Subcutaneous Q8H   insulin aspart  13 Units Subcutaneous 3 times per day   insulin detemir  45 Units Subcutaneous Q12H   midodrine  40 mg Per Tube Q8H   multivitamin  1 tablet Per Tube QHS   mouth rinse  15 mL Mouth Rinse 4 times per day   pantoprazole  40 mg Per Tube BID   QUEtiapine  50 mg Per Tube QHS   revefenacin  175 mcg Nebulization Daily   sevelamer carbonate  800 mg Oral TID WC   simethicone  80 mg Per Tube QID   sodium chloride flush  10-40 mL Intracatheter Q12H    have reviewed scheduled and prn medications.  Physical Exam: General: More alert awake, trying to eat breakfast this morning.  Heart:RRR, s1s2 nl Lungs: Some rhonchi bibasilar, no increased work of breathing. Abdomen:soft, Non-tender, non-distended Extremities: Trace pedal edema present Dialysis Access: Right IJ TDC in place, site clean.  Nyana Haren Prasad Jadian Karman 05/06/2022,10:11 AM  LOS: 26 days

## 2022-05-07 LAB — GLUCOSE, CAPILLARY
Glucose-Capillary: 128 mg/dL — ABNORMAL HIGH (ref 70–99)
Glucose-Capillary: 101 mg/dL — ABNORMAL HIGH (ref 70–99)
Glucose-Capillary: 154 mg/dL — ABNORMAL HIGH (ref 70–99)
Glucose-Capillary: 158 mg/dL — ABNORMAL HIGH (ref 70–99)
Glucose-Capillary: 115 mg/dL — ABNORMAL HIGH (ref 70–99)
Glucose-Capillary: 122 mg/dL — ABNORMAL HIGH (ref 70–99)

## 2022-05-07 LAB — CBC
HCT: 23.2 % — ABNORMAL LOW (ref 39.0–52.0)
Hemoglobin: 7.1 g/dL — ABNORMAL LOW (ref 13.0–17.0)
MCH: 27 pg (ref 26.0–34.0)
MCHC: 30.6 g/dL (ref 30.0–36.0)
MCV: 88.2 fL (ref 80.0–100.0)
Platelets: 349 10*3/uL (ref 150–400)
RBC: 2.63 MIL/uL — ABNORMAL LOW (ref 4.22–5.81)
RDW: 17.1 % — ABNORMAL HIGH (ref 11.5–15.5)
WBC: 12.5 10*3/uL — ABNORMAL HIGH (ref 4.0–10.5)
nRBC: 0 % (ref 0.0–0.2)

## 2022-05-07 LAB — RENAL FUNCTION PANEL
Albumin: 2.6 g/dL — ABNORMAL LOW (ref 3.5–5.0)
Anion gap: 19 — ABNORMAL HIGH (ref 5–15)
BUN: 76 mg/dL — ABNORMAL HIGH (ref 8–23)
CO2: 24 mmol/L (ref 22–32)
Calcium: 9.4 mg/dL (ref 8.9–10.3)
Chloride: 92 mmol/L — ABNORMAL LOW (ref 98–111)
Creatinine, Ser: 11.62 mg/dL — ABNORMAL HIGH (ref 0.61–1.24)
GFR, Estimated: 4 mL/min — ABNORMAL LOW (ref >60)
Glucose, Bld: 151 mg/dL — ABNORMAL HIGH (ref 70–99)
Phosphorus: 9.4 mg/dL — ABNORMAL HIGH (ref 2.5–4.6)
Potassium: 4.3 mmol/L (ref 3.5–5.1)
Sodium: 135 mmol/L (ref 135–145)

## 2022-05-07 LAB — MAGNESIUM: Magnesium: 2.2 mg/dL (ref 1.7–2.4)

## 2022-05-07 MED ORDER — ATORVASTATIN CALCIUM 80 MG PO TABS
80.0000 mg | ORAL_TABLET | Freq: Every day | ORAL | Status: DC
  Administered 2022-05-08 – 2022-05-13 (×6): 80 mg via ORAL
  Filled 2022-05-07 (×6): qty 1

## 2022-05-07 MED ORDER — HEPARIN SODIUM (PORCINE) 1000 UNIT/ML DIALYSIS
1000.0000 [IU] | INTRAMUSCULAR | Status: DC | PRN
  Administered 2022-05-07: 4000 [IU]
  Filled 2022-05-07: qty 1

## 2022-05-07 MED ORDER — ANTICOAGULANT SODIUM CITRATE 4% (200MG/5ML) IV SOLN: 5.0000 mL | Status: DC | PRN

## 2022-05-07 MED ORDER — SIMETHICONE 80 MG PO CHEW
80.0000 mg | CHEWABLE_TABLET | Freq: Four times a day (QID) | ORAL | Status: DC
  Administered 2022-05-07 – 2022-05-13 (×24): 80 mg via ORAL
  Filled 2022-05-07 (×24): qty 1

## 2022-05-07 MED ORDER — AMOXICILLIN-POT CLAVULANATE 500-125 MG PO TABS
500.0000 mg | ORAL_TABLET | Freq: Two times a day (BID) | ORAL | Status: AC
  Administered 2022-05-07 – 2022-05-10 (×8): 500 mg via ORAL
  Filled 2022-05-07 (×8): qty 1

## 2022-05-07 MED ORDER — MIDODRINE HCL 5 MG PO TABS
40.0000 mg | ORAL_TABLET | Freq: Three times a day (TID) | ORAL | Status: DC
  Administered 2022-05-07 – 2022-05-11 (×12): 40 mg via ORAL
  Filled 2022-05-07 (×13): qty 8

## 2022-05-07 MED ORDER — RENA-VITE PO TABS
1.0000 | ORAL_TABLET | Freq: Every day | ORAL | Status: DC
  Administered 2022-05-07 – 2022-05-13 (×7): 1 via ORAL
  Filled 2022-05-07 (×7): qty 1

## 2022-05-07 MED ORDER — QUETIAPINE FUMARATE 25 MG PO TABS
25.0000 mg | ORAL_TABLET | Freq: Every day | ORAL | Status: DC
  Administered 2022-05-07 – 2022-05-13 (×7): 25 mg via ORAL
  Filled 2022-05-07 (×7): qty 1

## 2022-05-07 MED ORDER — ACETAMINOPHEN 325 MG PO TABS
650.0000 mg | ORAL_TABLET | Freq: Four times a day (QID) | ORAL | Status: DC | PRN
Start: 2022-05-07 — End: 2022-05-14
  Administered 2022-05-07 – 2022-05-14 (×9): 650 mg via ORAL
  Filled 2022-05-07 (×9): qty 2

## 2022-05-07 MED ORDER — HEPARIN SODIUM (PORCINE) 1000 UNIT/ML IJ SOLN
INTRAMUSCULAR | Status: AC
  Filled 2022-05-07: qty 3

## 2022-05-07 MED ORDER — HEPARIN SODIUM (PORCINE) 1000 UNIT/ML IJ SOLN
2600.0000 [IU] | Freq: Once | INTRAMUSCULAR | Status: AC
  Administered 2022-05-07: 3000 [IU] via INTRAVENOUS

## 2022-05-07 MED ORDER — PANTOPRAZOLE SODIUM 40 MG PO TBEC
40.0000 mg | DELAYED_RELEASE_TABLET | Freq: Every day | ORAL | Status: DC
  Administered 2022-05-07 – 2022-05-13 (×7): 40 mg via ORAL
  Filled 2022-05-07 (×7): qty 1

## 2022-05-07 MED ORDER — CALCIUM CARBONATE ANTACID 500 MG PO CHEW: 1.0000 | CHEWABLE_TABLET | Freq: Three times a day (TID) | ORAL | Status: DC | PRN

## 2022-05-07 MED ORDER — CHLORHEXIDINE GLUCONATE CLOTH 2 % EX PADS
6.0000 | MEDICATED_PAD | Freq: Every day | CUTANEOUS | Status: DC
  Administered 2022-05-07 – 2022-05-13 (×5): 6 via TOPICAL

## 2022-05-07 MED ORDER — POLYETHYLENE GLYCOL 3350 17 G PO PACK
17.0000 g | PACK | Freq: Every day | ORAL | Status: DC | PRN
Start: 2022-05-07 — End: 2022-05-14

## 2022-05-07 MED ORDER — HEPARIN SODIUM (PORCINE) 1000 UNIT/ML IJ SOLN
INTRAMUSCULAR | Status: AC
  Filled 2022-05-07: qty 4

## 2022-05-07 MED ORDER — HEPARIN SODIUM (PORCINE) 1000 UNIT/ML DIALYSIS
20.0000 [IU]/kg | INTRAMUSCULAR | Status: DC | PRN
  Administered 2022-05-07: 3000 [IU] via INTRAVENOUS_CENTRAL
  Filled 2022-05-07 (×2): qty 3

## 2022-05-07 MED ORDER — SEVELAMER CARBONATE 800 MG PO TABS
1600.0000 mg | ORAL_TABLET | Freq: Three times a day (TID) | ORAL | Status: DC
  Administered 2022-05-07 – 2022-05-08 (×4): 1600 mg via ORAL
  Filled 2022-05-07 (×5): qty 2

## 2022-05-07 MED ORDER — ALTEPLASE 2 MG IJ SOLR: 2.0000 mg | Freq: Once | INTRAMUSCULAR | Status: DC | PRN

## 2022-05-07 NOTE — Progress Notes (Signed)
PROGRESS NOTE   Mathew Lara  KZS:[id number] DOB: [birth year] DOA: 04/10/2022 PCP: [name], MD  Brief Narrative:  61 year old black male known history of EtOH hep C HLD DM TY 2 HTN COPD Initial presentation [facility] 04/10/2022 epigastric pain radiating down lower abdomen cramping in nature associated with nausea--findings consistent with acute pancreatitis when admitted ./Creatinine was 41/3.0-18 8/6 overnight developed respiratory distress as well as shock placed on Levophed DKA was being treated 8/7 On pressors  8/8 BiPAP, worsening renal parameters 8/9 Tolerating CRRT, on BiPAP 8/10 has been off BiPAP, on high flow oxygen and tolerating 8/11 UOP improving. CRRT with net even UF 8/14 CT reveals worsening cavitary pneumonia and acute pancreatitis, large BM 8/15 abx changed for cavitary pneumonia 8/16 ID consult.  ID stopped vancomycin, Zosyn, azithromycin >narrowed to augmentin. CRRT stopped. 8/17 CRRT restarted.  Off pressors but placed on very high-dose midodrine 8/19 Remains off pressors, CRRT ongoing. HD cath required tPA.  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 Monday, Rena-Vite  start Renvela-periodic phos Acidosis now ok to be managed by dialysis   multilobular pneumonia with acute respiratory failure 8/14 Underlying COPD  OSA Requiring 3 weeks total Augmentin 250 ending on 05/09/2022 to be repeated on-CT chest to be repeated 9/10 Attempt to wean high flow nasal cannula to off Continue albuterol every 4 as needed 15 twice daily Yupelri 175 daily saline nasal spray CBC in am  Acute pancreatitis on admission DKA on Admission and underlying DM TY 2 Tube feeds weaned off on 8/30-change all meds to PO  Levemir 45 units-->32 U, scheduled insulin 13 units 3 times daily and sliding scale CBGs currently ranging 100-154  Anemia of critical illness and related to chronic ethanol FOBT neg 8/31, stool reported to be mushy and brown-check iron stores--transfuse X1 on 8/20 Does not meet transfusion threshold as yet-periodic labs  Underlying EtOH, hep C (per patient treated) Apprently underwent rx for hep C  probable ICU delirium  Now resolved-cutting back Seroquel from 50-25 discontinue gabapentin discontinue opiates  Severe protein energy malnutrition Calorie count in process-both eyes since taking p.o. and removed tube probably will discontinue count  DVT prophylaxis: SCD Code Status: Full Family Communication: None currently at bedside Disposition:  Status is: Inpatient Remains inpatient appropriate because:   Need to ensure hemodynamic stability as well as will need clip and eventual placement to dialysis center   Consultants:  None  Procedures: No  Antimicrobials: Augmentin   Subjective:  Pulled core track Today is quite sleepy no fevers no chills reported Tolerated full breakfast according to nursing  Objective: Vitals:   05/07/22 0900 05/07/22 1000 05/07/22 1100 05/07/22 1204  BP: 108/74 123/83 122/72   Pulse: 98 100  (!) 104  Resp: 15 (!) '21 20 17  '$ Temp:    98.1 F (36.7 C)  TempSrc:    Oral  SpO2: 99% 98%  98%  Weight:      Height:        Intake/Output Summary (Last 24 hours) at 05/07/2022 1423 Last data filed at 05/07/2022 0800 Gross per 24 hour  Intake 1754.83 ml  Output 50 ml   Net 1704.83 ml    Filed Weights   05/05/22 0227 05/06/22 0500 05/07/22 0326  Weight: 123.3 kg 119.1 kg 121.8 kg    Examination:  Coherent black male no distress no JVD On oxygen Moderate dentition S1-S2 no murmur CTA B no added sound Sinus, sinus tach on monitors Abdomen obese nontender nondistended     Data Reviewed: personally reviewed   CBC    Component Value Date/Time   WBC 12.2 (H) 05/06/2022 0041   RBC 2.68 (L) 05/06/2022 0041   RBC 2.69 (L) 05/06/2022 0041   HGB 7.2 (L) 05/06/2022 0041   HGB 14.0 03/16/2022 1050   HCT 23.5 (L) 05/06/2022 0041   HCT 42.4 03/16/2022 1050   PLT 315 05/06/2022 0041   PLT 258 03/16/2022 1050   MCV 87.7 05/06/2022 0041   MCV 80 03/16/2022 1050   MCH 26.9 05/06/2022 0041   MCHC 30.6 05/06/2022 0041   RDW 17.3 (H) 05/06/2022 0041   RDW 16.5 (H) 03/16/2022 1050   LYMPHSABS 1.4 04/21/2022 0559   LYMPHSABS 1.0 03/16/2022 1050   MONOABS 1.4 (H) 04/21/2022 0559   EOSABS 0.3 04/21/2022 0559   EOSABS 0.4 03/16/2022 1050   BASOSABS 0.1 04/21/2022 0559   BASOSABS 0.0 03/16/2022 1050      Latest Ref Rng & Units 05/07/2022   12:52 AM 05/06/2022   12:41 AM 05/05/2022    2:20 AM  CMP  Glucose 70 - 99 mg/dL 151  190  175   BUN 8 - 23 mg/dL 76  57  100   Creatinine 0.61 - 1.24 mg/dL 11.62  8.93  13.23   Sodium 135 - 145 mmol/L 135  137  135   Potassium 3.5 - 5.1 mmol/L 4.3  4.1  4.7   Chloride 98 - 111 mmol/L 92  94  94   CO2 22 - 32 mmol/L '24  26  22   '$ Calcium 8.9 - 10.3 mg/dL 9.4  9.1  9.3      Radiology Studies: No results found.   Scheduled Meds:  sodium chloride   Intravenous Once   amoxicillin-clavulanate  500 mg Oral BID   arformoterol  15 mcg Nebulization BID   [START ON 05/08/2022] atorvastatin  80 mg Oral Daily   Chlorhexidine Gluconate Cloth  6 each Topical Daily   Chlorhexidine Gluconate Cloth  6 each Topical Q0600   Chlorhexidine Gluconate Cloth  6 each Topical Q0600   darbepoetin (ARANESP) injection - DIALYSIS   60 mcg Intravenous Q Mon-HD   feeding supplement (NEPRO CARB STEADY)  237 mL Oral TID BM   feeding supplement (PROSource TF20)  70 mL Per Tube Daily   heparin  5,000 Units Subcutaneous Q8H   heparin sodium (porcine)       insulin aspart  1-3 Units Subcutaneous Q4H   insulin detemir  32 Units Subcutaneous Q12H   midodrine  40 mg Oral Q8H   multivitamin  1 tablet Oral QHS   mouth rinse  15 mL Mouth Rinse 4 times per day   pantoprazole  40 mg Oral QHS   QUEtiapine  25 mg Oral QHS   revefenacin  175 mcg Nebulization Daily   sevelamer carbonate  1,600 mg Oral TID WC   simethicone  80 mg  Oral QID   sodium chloride flush  10-40 mL Intracatheter Q12H   Continuous Infusions:  sodium chloride Stopped (05/02/22 1645)   sodium chloride     anticoagulant sodium citrate     feeding supplement (VITAL 1.5 CAL) Stopped (05/06/22 2200)     LOS: 27 days   Time spent: [place], MD [facility] To contact the attending provider between 7A-7P or the covering provider during after hours 7P-7A, please log into the web site [email or web address] and access using universal [place] password for that web site. If you do not have the password, please call the hospital operator.  05/07/2022, 2:23 PM

## 2022-05-07 NOTE — Progress Notes (Signed)
This rn arrived to pt room to do HD procedure. Alert and oriented.  Informed consent signed and in chart.   Treatment initiated:  Treatment completed:   Patient tolerated well. NS bolus 100 cc x 2 given d/t sbp 80s with dizziness and sbp 90s. Heparin x 2 doses given in HD blood lines d/t elevated TMP. Alert, without acute distress.  Hand-off given to patient's nurse.   Access used: HD catheter left chest Access issues: none  Total UF removed: 2.3 liters Medication(s) given: heparin 2400 units, heparin 2600 units, NS bolus 100 cc x 2 Post HD VS: 99/73 MAP 81 HR 104 RR 18 Sat 94% on room air Temp 98.2 oral  Post HD weight: 121.8 kg bed wt   [facility]

## 2022-05-07 NOTE — TOC Progression Note (Signed)
Transition of Care [facility]) - Initial/Assessment Note    Patient Details  Name: Mathew Lara MRN: [id number] Date of Birth: [birth year]  Transition of Care [facility]) CM/SW Contact:    Milinda Antis, [place] Phone Number: 05/07/2022, 4:15 PM  Clinical Narrative:                 CSW consulted with renal navigator.  Patient to be clipped to HD clinic once SNF facility is chosen.     Barriers to Discharge: Continued Medical Work up   Patient Goals and CMS Choice        Expected Discharge Plan and Services                                                Prior Living Arrangements/Services                       Activities of Daily Living Home Assistive Devices/Equipment: Environmental consultant (specify type) ADL Screening (condition at time of admission) Patient's cognitive ability adequate to safely complete daily activities?: No Is the patient deaf or have difficulty hearing?: No Does the patient have difficulty seeing, even when wearing glasses/contacts?: No Does the patient have difficulty concentrating, remembering, or making decisions?: Yes Patient able to express need for assistance with ADLs?: Yes Does the patient have difficulty dressing or bathing?: No Independently performs ADLs?: Yes (appropriate for developmental age) Does the patient have difficulty walking or climbing stairs?: No Weakness of Legs: Both Weakness of Arms/Hands: None  Permission Sought/Granted                  Emotional Assessment              Admission diagnosis:  DKA (diabetic ketoacidosis) ([place]) [E11.10] Acute pancreatitis, unspecified complication status, unspecified pancreatitis type [K85.90] Patient Active Problem List   Diagnosis Date Noted   Idiopathic hypotension    Cavitary pneumonia    Leukocytosis    Bile duct abnormality    Abnormal CT of the abdomen    Shock ([place])    DKA (diabetic ketoacidosis) ([place]) 04/10/2022   Acute pancreatitis 04/10/2022   SIRS (systemic  inflammatory response syndrome) ([place]) 04/10/2022   Hypertriglyceridemia 04/10/2022   Hypercholesterolemia 04/10/2022   Lactic acidosis 04/10/2022   Abdominal bloating 02/17/2022   Encounter for screening colonoscopy 06/17/2021   Fatty liver 03/05/2021   Chronic cholecystitis 02/24/2021   Exercise hypoxemia 06/03/2020   OSA on CPAP 06/03/2020   Healthcare maintenance 06/03/2020   ILD (interstitial lung disease) ([place]) 10/02/2019   GERD (gastroesophageal reflux disease) 10/02/2019   COPD with acute exacerbation ([place]) 09/28/2019   AKI on CKD stage 3b 09/19/2019   Acute respiratory failure with hypoxia ([place])    Chronic pain syndrome    History of COVID-19 08/29/2019   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   Acute renal failure ([place]) 03/26/2018   Rash 03/26/2018   Acute kidney failure, unspecified ([place]) 03/26/2018   Chest pain 03/08/2018   CKD (chronic kidney disease), stage III ([place]) 03/08/2018   COPD (chronic obstructive pulmonary disease) ([place]) 03/08/2018   Chronic kidney disease, stage 3 unspecified ([place]) 03/08/2018   Chronic obstructive pulmonary disease ([place]) 03/08/2018   Avascular necrosis of bone of left hip ([place]) 07/27/2017   Avascular necrosis of bone of right hip ([place]) 06/04/2016   Status post  total replacement of right hip 06/04/2016   Avascular necrosis of bone of hip ([place]) 06/04/2016   Hepatitis C 03/21/2015   Viral hepatitis C 03/21/2015   History of alcohol abuse 11/08/2013   Former smoker 11/08/2013   Tobacco user 11/08/2013   Essential hypertension 11/07/2013   Abnormal ECG 11/07/2013   PCP:  [name], MD Pharmacy:   [place], [place] - [address] [place] [address] Phone: [phone] Fax: [phone]     Social Determinants of Health (SDOH) Interventions    Readmission Risk Interventions     No data to display

## 2022-05-07 NOTE — TOC Progression Note (Addendum)
Transition of Care [facility]) - Initial/Assessment Note    Patient Details  Name: Mathew Lara MRN: [id number] Date of Birth: 02/05/1961  Transition [facility] Care [facility]) CM/SW Contact:    Milinda Antis, LCSWA Phone Number: 05/07/2022, 1:48 PM  Clinical Narrative:                 13:48-  CSW informed that insurance upheld LTACH denial.   14:34-  CSW received notification that the patient's daughter wanted to speak with social worker.  CSW called daughter and there was no answer.  CSW left a VM.  14:36-  CSW received a returned call from the patient's daughter, Sofie Rower.  The daughter reports that the patient will need to go to a SNF.  CSW explained that insurance has denied LTACH and that SNF will be the next option.  CSW explained that during the last conversation with the patient, patient was willing to go to SNF.  The daughter reports that she is POA if the patient is reporting that he wants to go home.  CSW explained when POA would come into effect and informed daughter that while patient is alert and oriented we have to follow patient's wishes.  TOC will continue to follow.     Barriers to Discharge: Continued Medical Work up   Patient Goals and CMS Choice        Expected Discharge Plan and Services                                                Prior Living Arrangements/Services                       Activities of Daily Living Home Assistive Devices/Equipment: Environmental consultant (specify type) ADL Screening (condition at time of admission) Patient's cognitive ability adequate to safely complete daily activities?: No Is the patient deaf or have difficulty hearing?: No Does the patient have difficulty seeing, even when wearing glasses/contacts?: No Does the patient have difficulty concentrating, remembering, or making decisions?: Yes Patient able to express need for assistance with ADLs?: Yes Does the patient have difficulty dressing or bathing?:  No Independently performs ADLs?: Yes (appropriate for developmental age) Does the patient have difficulty walking or climbing stairs?: No Weakness of Legs: Both Weakness of Arms/Hands: None  Permission Sought/Granted                  Emotional Assessment              Admission diagnosis:  DKA (diabetic ketoacidosis) ([place]) [E11.10] Acute pancreatitis, unspecified complication status, unspecified pancreatitis type [K85.90] Patient Active Problem List   Diagnosis Date Noted   Idiopathic hypotension    Cavitary pneumonia    Leukocytosis    Bile duct abnormality    Abnormal CT of the abdomen    Shock ([place])    DKA (diabetic ketoacidosis) ([place]) 04/10/2022   Acute pancreatitis 04/10/2022   SIRS (systemic inflammatory response syndrome) ([place]) 04/10/2022   Hypertriglyceridemia 04/10/2022   Hypercholesterolemia 04/10/2022   Lactic acidosis 04/10/2022   Abdominal bloating 02/17/2022   Encounter for screening colonoscopy 06/17/2021   Fatty liver 03/05/2021   Chronic cholecystitis 02/24/2021   Exercise hypoxemia 06/03/2020   OSA on CPAP 06/03/2020   Healthcare maintenance 06/03/2020   ILD (interstitial lung disease) ([place]) 10/02/2019   GERD (gastroesophageal reflux disease) 10/02/2019  COPD with acute exacerbation ([place]) 09/28/2019   AKI on CKD stage 3b 09/19/2019   Acute respiratory failure with hypoxia (HCC)    Chronic pain syndrome    History of COVID-19 08/29/2019   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   Acute renal failure ([place]) 03/26/2018   Rash 03/26/2018   Acute kidney failure, unspecified ([place]) 03/26/2018   Chest pain 03/08/2018   CKD (chronic kidney disease), stage III ([place]) 03/08/2018   COPD (chronic obstructive pulmonary disease) ([place]) 03/08/2018   Chronic kidney disease, stage 3 unspecified ([place]) 03/08/2018   Chronic obstructive pulmonary disease ([place]) 03/08/2018   Avascular necrosis of bone of left hip ([place]) 07/27/2017   Avascular necrosis  of bone of right hip ([place]) 06/04/2016   Status post total replacement of right hip 06/04/2016   Avascular necrosis of bone of hip ([place]) 06/04/2016   Hepatitis C 03/21/2015   Viral hepatitis C 03/21/2015   History of alcohol abuse 11/08/2013   Former smoker 11/08/2013   Tobacco user 11/08/2013   Essential hypertension 11/07/2013   Abnormal ECG 11/07/2013   PCP:  [name], MD Pharmacy:   [place], [place] - [address] [place] [address] Phone: [phone] Fax: [phone]     Social Determinants of Health (SDOH) Interventions    Readmission Risk Interventions     No data to display

## 2022-05-07 NOTE — NC FL2 (Addendum)
[place] LEVEL OF CARE SCREENING TOOL     IDENTIFICATION  Patient Name: Mathew Lara Birthdate: 1960-09-24 Sex: male Admission Date (Current Location): 04/10/2022  [facility] and [place] Number:  Herbalist and Address:  [facility] [place] [address], [place], [place] [address]      Provider Number: [id number]  Attending Physician Name and Address:  Nita Sells, MD  Relative Name and Phone Number:  McCollum,Camille (Daughter)   [phone]    Current Level of Care: Hospital Recommended Level of Care: [place] Prior Approval Number:    Date Approved/Denied:   PASRR Number: [id number]  Discharge Plan: SNF    Current Diagnoses: Patient Active Problem List   Diagnosis Date Noted   Idiopathic hypotension    Cavitary pneumonia    Leukocytosis    Bile duct abnormality    Abnormal CT of the abdomen    Shock ([place])    DKA (diabetic ketoacidosis) ([place]) 04/10/2022   Acute pancreatitis 04/10/2022   SIRS (systemic inflammatory response syndrome) ([place]) 04/10/2022   Hypertriglyceridemia 04/10/2022   Hypercholesterolemia 04/10/2022   Lactic acidosis 04/10/2022   Abdominal bloating 02/17/2022   Encounter for screening colonoscopy 06/17/2021   Fatty liver 03/05/2021   Chronic cholecystitis 02/24/2021   Exercise hypoxemia 06/03/2020   OSA on CPAP 06/03/2020   Healthcare maintenance 06/03/2020   ILD (interstitial lung disease) ([place]) 10/02/2019   GERD (gastroesophageal reflux disease) 10/02/2019   COPD with acute exacerbation ([place]) 09/28/2019   AKI on CKD stage 3b 09/19/2019   Acute respiratory failure with hypoxia (HCC)    Chronic pain syndrome    History of COVID-19 08/29/2019   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   Acute renal failure (HCC) 03/26/2018   Rash 03/26/2018   Acute kidney failure, unspecified ([place]) 03/26/2018   Chest pain 03/08/2018   CKD (chronic kidney disease), stage  III ([place]) 03/08/2018   COPD (chronic obstructive pulmonary disease) ([place]) 03/08/2018   Chronic kidney disease, stage 3 unspecified ([place]) 03/08/2018   Chronic obstructive pulmonary disease ([place]) 03/08/2018   Avascular necrosis of bone of left hip ([place]) 07/27/2017   Avascular necrosis of bone of right hip ([place]) 06/04/2016   Status post total replacement of right hip 06/04/2016   Avascular necrosis of bone of hip ([place]) 06/04/2016   Hepatitis C 03/21/2015   Viral hepatitis C 03/21/2015   History of alcohol abuse 11/08/2013   Former smoker 11/08/2013   Tobacco user 11/08/2013   Essential hypertension 11/07/2013   Abnormal ECG 11/07/2013    Orientation RESPIRATION BLADDER Height & Weight     Self, Time, Situation, Place  O2 (1L) Incontinent Weight: 268 lb 8.3 oz (121.8 kg) Height:  '5\' 8"'$  (172.7 cm)  BEHAVIORAL SYMPTOMS/MOOD NEUROLOGICAL BOWEL NUTRITION STATUS      Continent Diet (see d/c summary)  AMBULATORY STATUS COMMUNICATION OF NEEDS Skin   Extensive Assist Verbally Normal                       Personal Care Assistance Level of Assistance  Bathing, Feeding, Dressing Bathing Assistance: Maximum assistance Feeding assistance: Limited assistance Dressing Assistance: Maximum assistance     Functional Limitations Info  Speech, Hearing, Sight   Hearing Info: Adequate Speech Info: Adequate    SPECIAL CARE FACTORS FREQUENCY  PT (By licensed PT), OT (By licensed OT)     PT Frequency: 5x/ week OT Frequency: 5x/ week  Contractures Contractures Info: Not present    Additional Factors Info  Insulin Sliding Scale, Code Status, Allergies Code Status Info: Full Allergies Info: NKA   Insulin Sliding Scale Info: see d/c med list       Current Medications (05/07/2022):  This is the current hospital active medication list Current Facility-Administered Medications  Medication Dose Route Frequency Provider Last Rate Last Admin   0.9 %  sodium chloride infusion  (Manually program via Guardrails IV Fluids)   Intravenous Once [name], MD       0.9 %  sodium chloride infusion   Intravenous PRN Ander Slade, Adewale A, MD   Stopped at 05/02/22 1645   0.9 %  sodium chloride infusion  250 mL Intravenous Continuous Henri Medal, RPH       acetaminophen (TYLENOL) tablet 650 mg  650 mg Oral Q6H PRN Nita Sells, MD       albuterol (PROVENTIL) (2.5 MG/3ML) 0.083% nebulizer solution 2.5 mg  2.5 mg Nebulization Q4H PRN Zierle-Ghosh, Asia B, DO   2.5 mg at 04/14/22 0030   alteplase (CATHFLO ACTIVASE) injection 2 mg  2 mg Intracatheter Once PRN Rosita Fire, MD       amoxicillin-clavulanate (AUGMENTIN) 500-125 MG per tablet 500 mg  500 mg Oral BID Nita Sells, MD   500 mg at 05/07/22 1100   anticoagulant sodium citrate solution 5 mL  5 mL Intracatheter PRN Rosita Fire, MD       arformoterol [facility]) nebulizer solution 15 mcg  15 mcg Nebulization BID Jennelle Human B, NP   15 mcg at 05/07/22 1914   [START ON 05/08/2022] atorvastatin (LIPITOR) tablet 80 mg  80 mg Oral Daily Nita Sells, MD       calcium carbonate (TUMS - dosed in mg elemental calcium) chewable tablet 200 mg of elemental calcium  1 tablet Oral Q8H PRN Nita Sells, MD       Chlorhexidine Gluconate Cloth 2 % PADS 6 each  6 each Topical Daily Olalere, Adewale A, MD   6 each at 05/06/22 0958   Chlorhexidine Gluconate Cloth 2 % PADS 6 each  6 each Topical Q0600 Rosita Fire, MD   6 each at 05/06/22 0958   Chlorhexidine Gluconate Cloth 2 % PADS 6 each  6 each Topical Q0600 Rosita Fire, MD   6 each at 05/07/22 0830   Darbepoetin Alfa (ARANESP) injection 60 mcg  60 mcg Intravenous Q Mon-HD Rosita Fire, MD   60 mcg at 05/03/22 1252   feeding supplement (NEPRO CARB STEADY) liquid 237 mL  237 mL Oral TID BM Noemi Chapel P, DO   237 mL at 05/07/22 1446   feeding supplement (PROSource TF20) liquid 70 mL  70 mL Per Tube Daily Holley Bouche, MD   70 mL at 05/06/22 0944   feeding supplement (VITAL 1.5 CAL) liquid 850 mL  850 mL Per Tube Continuous Julian Hy, DO   Stopped at 05/06/22 2200   heparin injection 1,000 Units  1,000 Units Intracatheter PRN Rosita Fire, MD       heparin injection 2,400 Units  20 Units/kg Dialysis PRN Rosita Fire, MD       heparin injection 5,000 Units  5,000 Units Subcutaneous Q8H Zierle-Ghosh, Asia B, DO   5,000 Units at 05/07/22 1445   heparin sodium (porcine) 1000 UNIT/ML injection            heparin sodium (porcine) 1000 UNIT/ML injection  heparin sodium (porcine) 1000 UNIT/ML injection            insulin aspart (novoLOG) injection 1-3 Units  1-3 Units Subcutaneous Q4H Nita Sells, MD   2 Units at 05/07/22 1201   insulin detemir (LEVEMIR) injection 32 Units  32 Units Subcutaneous Q12H Nita Sells, MD   32 Units at 05/07/22 1013   midodrine (PROAMATINE) tablet 40 mg  40 mg Oral Q8H Shela Leff, MD   40 mg at 05/07/22 0813   multivitamin (RENA-VIT) tablet 1 tablet  1 tablet Oral QHS Nita Sells, MD       ondansetron (ZOFRAN) injection 4 mg  4 mg Intravenous Q6H PRN Zierle-Ghosh, Asia B, DO   4 mg at 05/04/22 2314   Oral care mouth rinse  15 mL Mouth Rinse 4 times per day Margaretha Seeds, MD   15 mL at 05/07/22 1201   Oral care mouth rinse  15 mL Mouth Rinse PRN Margaretha Seeds, MD       pantoprazole (PROTONIX) EC tablet 40 mg  40 mg Oral QHS Nita Sells, MD       polyethylene glycol (MIRALAX / GLYCOLAX) packet 17 g  17 g Oral Daily PRN Nita Sells, MD       QUEtiapine (SEROQUEL) tablet 25 mg  25 mg Oral QHS Nita Sells, MD       revefenacin (YUPELRI) nebulizer solution 175 mcg  175 mcg Nebulization Daily Julian Hy, DO   175 mcg at 05/07/22 4076   sevelamer carbonate (RENVELA) tablet 1,600 mg  1,600 mg Oral TID WC Rosita Fire, MD       simethicone [facility]) chewable tablet 80 mg  80 mg  Oral QID Nita Sells, MD   80 mg at 05/07/22 1446   sodium chloride (OCEAN) 0.65 % nasal spray 1 spray  1 spray Each Nare PRN Candee Furbish, MD       sodium chloride flush (NS) 0.9 % injection 10-40 mL  10-40 mL Intracatheter Q12H Agarwala, Ravi, MD   10 mL at 05/07/22 1201   sodium chloride flush (NS) 0.9 % injection 10-40 mL  10-40 mL Intracatheter PRN Kipp Brood, MD   40 mL at 04/25/22 0250   white petrolatum (VASELINE) gel   Topical PRN Kipp Brood, MD   Given at 04/12/22 0200     Discharge Medications: Please see discharge summary for a list of discharge medications.  Relevant Imaging Results:  Relevant Lab Results:   Additional Information SSN:  [id number] ;  5'8" 268lbs; HD  Paulene Floor Josia Cueva, LCSWA

## 2022-05-07 NOTE — Progress Notes (Signed)
Patient continues to refuse BIPAP QHS.

## 2022-05-07 NOTE — Progress Notes (Signed)
Physical Therapy Treatment Patient Details Name: Mathew Lara MRN: [id number] DOB: 14-Jan-1961 Today's Date: 05/07/2022   History of Present Illness Pt is 61 yo male [facility] ED on 8/5 with abdominal pain. ED work-up revealed DKA, acute pancreatitis, AKI. CT 8/5 compatible with acute pancreatitis. Overnight 8/5 developed continuing respiratory failure and sock. Transfer to [facility]. Placed on BiPAP. Started on CRRT 04/13/22 - 04/25/22. Repeat CT abd pelvis 8/14 with edematous pancreas but no evidence of necrotizing pancreatitis. s/p TIJ TDC placed by IR on 8/29. PMH including cholecystectomy (March 23, 2021), alcohol abuse, anxiety, CKD, COPD, diabetes mellitus type 2, essential hypertension, hepatitis C, mixed hyperlipidemia.    PT Comments    Pt showing good progress toward goals.  Eager to get OOB and progress gait.  Emphasis on transitions, sit to stand technique/stability, and progression of gait stability/stamina with the RW.    Recommendations for follow up therapy are one component of a multi-disciplinary discharge planning process, led by the attending physician.  Recommendations may be updated based on patient status, additional functional criteria and insurance authorization.  Follow Up Recommendations  Skilled nursing-short term rehab (<3 hours/day) Can patient physically be transported by private vehicle: Yes   Assistance Recommended at Discharge Frequent or constant Supervision/Assistance  Patient can return home with the following Assist for transportation;Assistance with cooking/housework;Help with stairs or ramp for entrance;A lot of help with walking and/or transfers;A lot of help with bathing/dressing/bathroom   Equipment Recommendations   (TBD)    Recommendations for Other Services       Precautions / Restrictions Precautions Precautions: Fall Precaution Comments: cortrack     Mobility  Bed Mobility Overal bed mobility: Needs Assistance Bed  Mobility: Supine to Sit     Supine to sit: Mod assist Sit to supine: Mod assist   General bed mobility comments: truncal and LE assist with initiatng cues    Transfers Overall transfer level: Needs assistance Equipment used: Rolling walker (2 wheels) Transfers: Sit to/from Stand Sit to Stand: Min assist, +2 safety/equipment           General transfer comment: cues for hand placement, demo of technique, min assist to help forward.    Ambulation/Gait Ambulation/Gait assistance: Min assist Gait Distance (Feet): 80 Feet (then 35 feet after sitting rest.)   Gait Pattern/deviations: Step-through pattern   Gait velocity interpretation: <1.8 ft/sec, indicate of risk for recurrent falls   General Gait Details: mildly unsteady, shorter step length, cues for posture and proximity to the RW x2 trials.   Stairs             Wheelchair Mobility    Modified Rankin (Stroke Patients Only)       Balance Overall balance assessment: Needs assistance Sitting-balance support: No upper extremity supported, Feet supported Sitting balance-Leahy Scale: Fair     Standing balance support: Bilateral upper extremity supported, No upper extremity supported, During functional activity Standing balance-Leahy Scale: Poor                              Cognition Arousal/Alertness: Awake/alert Behavior During Therapy: WFL for tasks assessed/performed Overall Cognitive Status:  (NT formally, difficult to assess)                                          Exercises  General Comments        Pertinent Vitals/Pain Pain Assessment Pain Assessment: Faces Faces Pain Scale: No hurt    Home Living                          Prior Function            PT Goals (current goals can now be found in the care plan section) Acute Rehab PT Goals Patient Stated Goal: unable to state other than wants bipap off PT Goal Formulation: With patient Time  For Goal Achievement: 05/11/22 Potential to Achieve Goals: Good Progress towards PT goals: Progressing toward goals    Frequency    Min 3X/week      PT Plan Current plan remains appropriate    Co-evaluation              AM-PAC PT "6 Clicks" Mobility   Outcome Measure  Help needed turning from your back to your side while in a flat bed without using bedrails?: A Lot Help needed moving from lying on your back to sitting on the side of a flat bed without using bedrails?: A Lot Help needed moving to and from a bed to a chair (including a wheelchair)?: A Little Help needed standing up from a chair using your arms (e.g., wheelchair or bedside chair)?: A Little Help needed to walk in hospital room?: A Little Help needed climbing 3-5 steps with a railing? : A Lot 6 Click Score: 15    End of Session   Activity Tolerance: Patient tolerated treatment well Patient left: in bed;with call bell/phone within reach;with bed alarm set Nurse Communication: Mobility status PT Visit Diagnosis: Other abnormalities of gait and mobility (R26.89);Muscle weakness (generalized) (M62.81)     Time: 7741-2878 PT Time Calculation (min) (ACUTE ONLY): 30 min  Charges:  $Gait Training: 8-22 mins $Therapeutic Activity: 8-22 mins                     05/07/2022  Ginger Carne., PT Acute Rehabilitation Services [phone]  (pager) [phone]  (office)   [name] 05/07/2022, 5:24 PM

## 2022-05-07 NOTE — Progress Notes (Signed)
[facility] NEPHROLOGY PROGRESS NOTE  Assessment/ Plan:  # Anuric dialysis dependent AKI/CKD stage IIIb-IV - presumably ischemic ATN in setting of acute pancreatitis, hypotension, pressor support, with concomitant ARB, SGLT-2 inhibitor and finerenone.  Initially non-oliguric but now oligo-anuric.  -Continue to hold losartan, Jardiance, and Kerendia.   -Started on CRRT 04/13/22 - 04/25/22.  Started iHD 8/21. -s/p TIJ TDC placed by IR on 8/29. -No urine output and patient is getting hypoxic and fluid overload.  No sign of renal recovery.  We will plan to do dialysis today.  If he can come up to the unit for dialysis as he is awaiting transfer from ICU.  Discussed with RN.  # Shock -on off pressors per PCCM; on very high dose of midodrine.  #Acute hypoxic respiratory failure - with underlying COPD and OSA.  Currently on oxygen via nasal cannula.  Manage volume with HD.  #Hep C with advanced liver fibrosis.  #Chronic diastolic CHF - stable.  #Anemia; transfuse prn.  Iron saturation low with high ferritin because of inflammatory state.  Started ESA, receiving blood transfusion intermittently.  #Hyperphosphatemia: Increase sevelamer dose, continue renal/low Phos diet.  #Acute metabolic encephalopathy, possible ICU delirium.  # Acute pancreatitis: resolved  Subjective: Seen and examined at the bedside.  Requiring more oxygen and looks less alert than yesterday.  Urine output is only charted 50 cc.  Review of system limited because of encephalopathy.  Objective Vital signs in last 24 hours: Vitals:   05/07/22 0813 05/07/22 0900 05/07/22 1000 05/07/22 1100  BP:  108/74 123/83 122/72  Pulse:  98 100   Resp:  15 (!) 21 20  Temp: 97.9 F (36.6 C)     TempSrc: Axillary     SpO2:  99% 98%   Weight:      Height:       Weight change: 2.7 kg  Intake/Output Summary (Last 24 hours) at 05/07/2022 1126 Last data filed at 05/07/2022 0800 Gross per 24 hour  Intake 1754.83 ml  Output  50 ml  Net 1704.83 ml        Labs: RENAL PANEL Recent Labs    04/26/22 0356 04/26/22 1816 04/27/22 0516 04/27/22 1536 04/28/22 0338 04/28/22 1550 04/29/22 0332 04/29/22 1515 04/30/22 0317 04/30/22 1255 04/30/22 1725 04/30/22 2114 05/01/22 0600 05/01/22 0946 05/02/22 0045 05/02/22 0051 05/03/22 0111 05/03/22 0920 05/04/22 0044 05/04/22 0829 05/05/22 0220 05/06/22 0041 05/07/22 0052  NA 133* 132* 131* 134* 133* 131* 128*   < > 131*   < > 135 132* 132* 133* 133*  --   --  135  --  134* 135 137 135  K 5.3* 3.9 4.4 4.5 4.7 4.1 4.4   < > 4.1   < > 3.8 3.9 4.0 4.0 4.5  --   --  5.1  --  5.0 4.7 4.1 4.3  CL 97* 93* 94* 95* 95* 91* 89*   < > 91*   < > 91* 91* 91* 91* 92*  --   --  92*  --  93* 94* 94* 92*  CO2 20* '26 26 25 23 24 24   '$ < > 24   < > '28 24 24 24 '$ 20*  --   --  22  --  '22 22 26 24  '$ GLUCOSE 179* 173* 197* 257* 290* 305* 417*   < > 235*   < > 202* 178* 189* 223* 241*  --   --  150*  --  132* 175* 190* 151*  BUN 44* 26* 36* 46* 58* 33* 42*   < > 60*   < > 27* 31* 41* 40* 58*  --   --  81*  --  95* 100* 57* 76*  CREATININE 5.40* 3.94* 5.69* 6.89* 8.60* 5.69* 7.13*   < > 9.21*   < > 5.32* 5.65* 6.77* 7.24* 8.72*  --   --  11.30*  --  12.73* 13.23* 8.93* 11.62*  CALCIUM 9.7 8.9 8.9 9.4 9.4 9.0 9.0   < > 9.4   < > 9.6 9.6 9.7 9.7 9.7  --   --  10.1  --  9.8 9.3 9.1 9.4  MG 2.8*  --  2.3  --  2.6*  --  2.1  --  2.4  --   --   --  1.9  --   --  2.2 2.4  --  2.4  --  2.2 1.9 2.2  PHOS 4.4 3.1 5.5* 6.2* 6.6* 4.9* 5.3*  --   --   --   --   --   --   --  6.3*  --   --   --   --   --   --  5.9* 9.4*  ALBUMIN 2.3* 2.3* 2.3* 2.3* 2.4* 2.4* 2.2*  --   --   --   --   --   --   --  2.6*  --   --   --   --   --   --  2.4* 2.6*   < > = values in this interval not displayed.      Liver Function Tests: Recent Labs  Lab 05/02/22 0045 05/06/22 0041 05/07/22 0052  ALBUMIN 2.6* 2.4* 2.6*    No results for input(s): "LIPASE", "AMYLASE" in the last 168 hours. No results for input(s):  "AMMONIA" in the last 168 hours. CBC: Recent Labs    05/01/22 0600 05/01/22 0946 05/02/22 0051 05/03/22 0111 05/04/22 0044 05/04/22 0829 05/05/22 0220 05/06/22 0041  HGB 7.7*  --    < > 7.4* 6.7* 7.3* 7.1* 7.2*  MCV 84.3  --    < > 86.3 86.3  --  86.3 87.7  VITAMINB12  --   --   --   --   --   --   --  571  FOLATE  --   --   --   --   --   --   --  23.7  FERRITIN 1,149*  --   --   --   --   --   --  1,477*  TIBC  --  284  --   --   --   --   --  298  IRON  --  35*  --   --   --   --   --  27*  RETICCTPCT  --   --   --   --   --   --   --  2.5   < > = values in this interval not displayed.     Cardiac Enzymes: No results for input(s): "CKTOTAL", "CKMB", "CKMBINDEX", "TROPONINI" in the last 168 hours. CBG: Recent Labs  Lab 05/06/22 1717 05/06/22 1959 05/06/22 2339 05/07/22 0316 05/07/22 0812  GLUCAP 135* 173* 150* 128* 101*     Iron Studies:  Recent Labs    05/06/22 0041  IRON 27*  TIBC 298  FERRITIN 1,477*    Studies/Results: No results found.  Medications: Infusions:  sodium chloride Stopped (05/02/22  1645)   sodium chloride     feeding supplement (VITAL 1.5 CAL) Stopped (05/06/22 2200)    Scheduled Medications:  sodium chloride   Intravenous Once   amoxicillin-clavulanate  500 mg Oral BID   arformoterol  15 mcg Nebulization BID   [START ON 05/08/2022] atorvastatin  80 mg Oral Daily   Chlorhexidine Gluconate Cloth  6 each Topical Daily   Chlorhexidine Gluconate Cloth  6 each Topical Q0600   Chlorhexidine Gluconate Cloth  6 each Topical Q0600   darbepoetin (ARANESP) injection - DIALYSIS  60 mcg Intravenous Q Mon-HD   feeding supplement (NEPRO CARB STEADY)  237 mL Oral TID BM   feeding supplement (PROSource TF20)  70 mL Per Tube Daily   heparin  5,000 Units Subcutaneous Q8H   heparin sodium (porcine)       insulin aspart  1-3 Units Subcutaneous Q4H   insulin detemir  32 Units Subcutaneous Q12H   midodrine  40 mg Oral Q8H   multivitamin  1 tablet  Oral QHS   mouth rinse  15 mL Mouth Rinse 4 times per day   pantoprazole  40 mg Oral QHS   QUEtiapine  25 mg Oral QHS   revefenacin  175 mcg Nebulization Daily   sevelamer carbonate  800 mg Oral TID WC   simethicone  80 mg Oral QID   sodium chloride flush  10-40 mL Intracatheter Q12H    have reviewed scheduled and prn medications.  Physical Exam: General: Open eyes and answer with his name but looks more lethargic than yesterday Heart:RRR, s1s2 nl Lungs: Some rhonchi bibasilar, no increased work of breathing. Abdomen:soft, Non-tender, non-distended Extremities: Bilateral leg edema present+ Dialysis Access: Right IJ TDC in place, site clean.  Mathew Lara Mathew Lara Mathew Lara 05/07/2022,11:26 AM  LOS: 27 days

## 2022-05-08 ENCOUNTER — Inpatient Hospital Stay (HOSPITAL_COMMUNITY): Payer: Medicare Other

## 2022-05-08 LAB — COMPREHENSIVE METABOLIC PANEL
ALT: 17 U/L (ref 0–44)
AST: 42 U/L — ABNORMAL HIGH (ref 15–41)
Albumin: 2.8 g/dL — ABNORMAL LOW (ref 3.5–5.0)
Alkaline Phosphatase: 78 U/L (ref 38–126)
Anion gap: 19 — ABNORMAL HIGH (ref 5–15)
BUN: 42 mg/dL — ABNORMAL HIGH (ref 8–23)
CO2: 23 mmol/L (ref 22–32)
Calcium: 10.2 mg/dL (ref 8.9–10.3)
Chloride: 97 mmol/L — ABNORMAL LOW (ref 98–111)
Creatinine, Ser: 7.82 mg/dL — ABNORMAL HIGH (ref 0.61–1.24)
GFR, Estimated: 7 mL/min — ABNORMAL LOW (ref >60)
Glucose, Bld: 116 mg/dL — ABNORMAL HIGH (ref 70–99)
Potassium: 4.2 mmol/L (ref 3.5–5.1)
Sodium: 139 mmol/L (ref 135–145)
Total Bilirubin: 0.5 mg/dL (ref 0.3–1.2)
Total Protein: 8.8 g/dL — ABNORMAL HIGH (ref 6.5–8.1)

## 2022-05-08 LAB — CBC WITH DIFFERENTIAL/PLATELET
Abs Immature Granulocytes: 0.07 10*3/uL (ref 0.00–0.07)
Basophils Absolute: 0.1 10*3/uL (ref 0.0–0.1)
Basophils Relative: 1 %
Eosinophils Absolute: 0.8 10*3/uL — ABNORMAL HIGH (ref 0.0–0.5)
Eosinophils Relative: 6 %
HCT: 25.7 % — ABNORMAL LOW (ref 39.0–52.0)
Hemoglobin: 7.8 g/dL — ABNORMAL LOW (ref 13.0–17.0)
Immature Granulocytes: 1 %
Lymphocytes Relative: 12 %
Lymphs Abs: 1.5 10*3/uL (ref 0.7–4.0)
MCH: 26.6 pg (ref 26.0–34.0)
MCHC: 30.4 g/dL (ref 30.0–36.0)
MCV: 87.7 fL (ref 80.0–100.0)
Monocytes Absolute: 1.1 10*3/uL — ABNORMAL HIGH (ref 0.1–1.0)
Monocytes Relative: 9 %
Neutro Abs: 8.9 10*3/uL — ABNORMAL HIGH (ref 1.7–7.7)
Neutrophils Relative %: 71 %
Platelets: 377 10*3/uL (ref 150–400)
RBC: 2.93 MIL/uL — ABNORMAL LOW (ref 4.22–5.81)
RDW: 17.2 % — ABNORMAL HIGH (ref 11.5–15.5)
WBC: 12.4 10*3/uL — ABNORMAL HIGH (ref 4.0–10.5)
nRBC: 0 % (ref 0.0–0.2)

## 2022-05-08 LAB — GLUCOSE, CAPILLARY
Glucose-Capillary: 105 mg/dL — ABNORMAL HIGH (ref 70–99)
Glucose-Capillary: 152 mg/dL — ABNORMAL HIGH (ref 70–99)
Glucose-Capillary: 157 mg/dL — ABNORMAL HIGH (ref 70–99)
Glucose-Capillary: 130 mg/dL — ABNORMAL HIGH (ref 70–99)
Glucose-Capillary: 121 mg/dL — ABNORMAL HIGH (ref 70–99)
Glucose-Capillary: 133 mg/dL — ABNORMAL HIGH (ref 70–99)
Glucose-Capillary: 121 mg/dL — ABNORMAL HIGH (ref 70–99)

## 2022-05-08 LAB — MAGNESIUM: Magnesium: 1.8 mg/dL (ref 1.7–2.4)

## 2022-05-08 NOTE — Progress Notes (Signed)
RN walked into pt's room when RN noticed pt's O2 sat were in the 70's. Pt was not wearing nasal canula, RN reapplied nasal canula and waited for pt's O2 sats to come up. Approx 5 mins later RN increased O2 to 4L [place] to maintain sats >90%. MD notified and CXR order placed.

## 2022-05-08 NOTE — Progress Notes (Signed)
[facility] NEPHROLOGY PROGRESS NOTE  Assessment/ Plan:  # Anuric dialysis dependent AKI/CKD stage IIIb-IV - presumably ischemic ATN in setting of acute pancreatitis, hypotension, pressor support, with concomitant ARB, SGLT-2 inhibitor and finerenone.  Initially non-oliguric but now oligo-anuric.  -Continue to hold losartan, Jardiance, and Kerendia.   -Started on CRRT 04/13/22 - 04/25/22.  Started iHD 8/21. -s/p TIJ TDC placed by IR on 8/29. -Status post HD yesterday with around 2.3 L ultrafiltration, -No urine output and patient is getting hypoxic and fluid overload.  No sign of renal recovery.  Continue daily labs and a strict ins and out to monitor renal function.  Daily assessment for HD needed.  # Shock -on off pressors per PCCM; on very high dose of midodrine.  Blood pressure remains low.  #Acute hypoxic respiratory failure - with underlying COPD and OSA.  Currently on oxygen via nasal cannula.  Manage volume with HD.  #Hep C with advanced liver fibrosis.  #Chronic diastolic CHF - stable.  #Anemia; transfuse prn.  Iron saturation low with high ferritin because of inflammatory state.  Started ESA, receiving blood transfusion intermittently.  #Hyperphosphatemia: Increased sevelamer dose, continue renal/low Phos diet.  #Acute metabolic encephalopathy, possible ICU delirium.  Mental status seems to be better today.  # Acute pancreatitis: resolved  Subjective: Seen and examined at the bedside.  Transferred out of ICU.  No urine output documented.  Had dialysis yesterday.  Blood pressure is soft but denies nausea vomiting chest pain shortness of breath.  No headache or dizziness. Objective Vital signs in last 24 hours: Vitals:   05/08/22 0000 05/08/22 0400 05/08/22 0742 05/08/22 0826  BP: 99/74 92/76 107/74   Pulse: (!) 110 (!) 104 (!) 104   Resp:  20 18   Temp: 98.6 F (37 C) 98.6 F (37 C) 98.2 F (36.8 C)   TempSrc: Oral Oral Oral   SpO2: 97% 94% 90% 98%   Weight:      Height:       Weight change: 0 kg  Intake/Output Summary (Last 24 hours) at 05/08/2022 1159 Last data filed at 05/08/2022 0747 Gross per 24 hour  Intake 240 ml  Output 3.3 ml  Net 236.7 ml        Labs: RENAL PANEL Recent Labs    04/26/22 0356 04/26/22 1816 04/27/22 0516 04/27/22 1536 04/28/22 0338 04/28/22 1550 04/29/22 0332 04/29/22 1515 04/30/22 0317 04/30/22 1255 04/30/22 2114 05/01/22 0600 05/01/22 0946 05/02/22 0045 05/02/22 0051 05/03/22 0111 05/03/22 0920 05/04/22 0044 05/04/22 0829 05/05/22 0220 05/06/22 0041 05/07/22 0052 05/08/22 0618  NA 133* 132* 131* 134* 133* 131* 128*   < > 131*   < > 132* 132* 133* 133*  --   --  135  --  134* 135 137 135 139  K 5.3* 3.9 4.4 4.5 4.7 4.1 4.4   < > 4.1   < > 3.9 4.0 4.0 4.5  --   --  5.1  --  5.0 4.7 4.1 4.3 4.2  CL 97* 93* 94* 95* 95* 91* 89*   < > 91*   < > 91* 91* 91* 92*  --   --  92*  --  93* 94* 94* 92* 97*  CO2 20* '26 26 25 23 24 24   '$ < > 24   < > '24 24 24 '$ 20*  --   --  22  --  '22 22 26 24 23  '$ GLUCOSE 179* 173* 197* 257* 290* 305* 417*   < >  235*   < > 178* 189* 223* 241*  --   --  150*  --  132* 175* 190* 151* 116*  BUN 44* 26* 36* 46* 58* 33* 42*   < > 60*   < > 31* 41* 40* 58*  --   --  81*  --  95* 100* 57* 76* 42*  CREATININE 5.40* 3.94* 5.69* 6.89* 8.60* 5.69* 7.13*   < > 9.21*   < > 5.65* 6.77* 7.24* 8.72*  --   --  11.30*  --  12.73* 13.23* 8.93* 11.62* 7.82*  CALCIUM 9.7 8.9 8.9 9.4 9.4 9.0 9.0   < > 9.4   < > 9.6 9.7 9.7 9.7  --   --  10.1  --  9.8 9.3 9.1 9.4 10.2  MG 2.8*  --  2.3  --  2.6*  --  2.1  --  2.4  --   --  1.9  --   --  2.2 2.4  --  2.4  --  2.2 1.9 2.2 1.8  PHOS 4.4 3.1 5.5* 6.2* 6.6* 4.9* 5.3*  --   --   --   --   --   --  6.3*  --   --   --   --   --   --  5.9* 9.4*  --   ALBUMIN 2.3* 2.3* 2.3* 2.3* 2.4* 2.4* 2.2*  --   --   --   --   --   --  2.6*  --   --   --   --   --   --  2.4* 2.6* 2.8*   < > = values in this interval not displayed.      Liver Function  Tests: Recent Labs  Lab 05/06/22 0041 05/07/22 0052 05/08/22 0618  AST  --   --  42*  ALT  --   --  17  ALKPHOS  --   --  78  BILITOT  --   --  0.5  PROT  --   --  8.8*  ALBUMIN 2.4* 2.6* 2.8*    No results for input(s): "LIPASE", "AMYLASE" in the last 168 hours. No results for input(s): "AMMONIA" in the last 168 hours. CBC: Recent Labs    05/01/22 0600 05/01/22 0946 05/02/22 0051 05/04/22 0829 05/05/22 0220 05/06/22 0041 05/07/22 1631 05/08/22 0618  HGB 7.7*  --    < > 7.3* 7.1* 7.2* 7.1* 7.8*  MCV 84.3  --    < >  --  86.3 87.7 88.2 87.7  VITAMINB12  --   --   --   --   --  571  --   --   FOLATE  --   --   --   --   --  23.7  --   --   FERRITIN 1,149*  --   --   --   --  1,477*  --   --   TIBC  --  284  --   --   --  298  --   --   IRON  --  35*  --   --   --  27*  --   --   RETICCTPCT  --   --   --   --   --  2.5  --   --    < > = values in this interval not displayed.     Cardiac Enzymes: No results for input(s): "CKTOTAL", "CKMB", "CKMBINDEX", "  TROPONINI" in the last 168 hours. CBG: Recent Labs  Lab 05/07/22 1605 05/07/22 1938 05/07/22 2152 05/08/22 0417 05/08/22 0746  GLUCAP 158* 115* 122* 105* 152*     Iron Studies:  Recent Labs    05/06/22 0041  IRON 27*  TIBC 298  FERRITIN 1,477*    Studies/Results: No results found.  Medications: Infusions:  sodium chloride Stopped (05/02/22 1645)   sodium chloride     anticoagulant sodium citrate     feeding supplement (VITAL 1.5 CAL) Stopped (05/06/22 2200)    Scheduled Medications:  sodium chloride   Intravenous Once   amoxicillin-clavulanate  500 mg Oral BID   arformoterol  15 mcg Nebulization BID   atorvastatin  80 mg Oral Daily   Chlorhexidine Gluconate Cloth  6 each Topical Daily   Chlorhexidine Gluconate Cloth  6 each Topical Q0600   Chlorhexidine Gluconate Cloth  6 each Topical Q0600   darbepoetin (ARANESP) injection - DIALYSIS  60 mcg Intravenous Q Mon-HD   feeding supplement (NEPRO  CARB STEADY)  237 mL Oral TID BM   feeding supplement (PROSource TF20)  70 mL Per Tube Daily   heparin  5,000 Units Subcutaneous Q8H   insulin aspart  1-3 Units Subcutaneous Q4H   insulin detemir  32 Units Subcutaneous Q12H   midodrine  40 mg Oral Q8H   multivitamin  1 tablet Oral QHS   mouth rinse  15 mL Mouth Rinse 4 times per day   pantoprazole  40 mg Oral QHS   QUEtiapine  25 mg Oral QHS   revefenacin  175 mcg Nebulization Daily   sevelamer carbonate  1,600 mg Oral TID WC   simethicone  80 mg Oral QID   sodium chloride flush  10-40 mL Intracatheter Q12H    have reviewed scheduled and prn medications.  Physical Exam: General: More alert and awake and comfortable. Heart:RRR, s1s2 nl Lungs: Clear bilateral, no wheeze Abdomen:soft, Non-tender, non-distended Extremities: Bilateral leg edema present+ Dialysis Access: Right IJ TDC in place, site clean.  Mathew Lara 05/08/2022,11:59 AM  LOS: 28 days

## 2022-05-08 NOTE — Progress Notes (Signed)
PROGRESS NOTE   Mathew Lara  NWG:[id number] DOB: 1961-07-29 DOA: 04/10/2022 PCP: [name], MD  Brief Narrative:  61 year old black male known history of EtOH hep C HLD DM TY 2 HTN COPD Initial presentation [facility] 04/10/2022 epigastric pain radiating down lower abdomen cramping in nature associated with nausea--findings consistent with acute pancreatitis when admitted ./Creatinine was 41/3.0-18 8/6 overnight developed respiratory distress as well as shock placed on Levophed DKA was being treated 8/7 On pressors  8/8 BiPAP, worsening renal parameters 8/9 Tolerating CRRT, on BiPAP 8/10 has been off BiPAP, on high flow oxygen and tolerating 8/11 UOP improving. CRRT with net even UF 8/14 CT reveals worsening cavitary pneumonia and acute pancreatitis, large BM 8/15 abx changed for cavitary pneumonia 8/16 ID consult.  ID stopped vancomycin, Zosyn, azithromycin >narrowed to augmentin. CRRT stopped. 8/17 CRRT restarted.  Off pressors but placed on very high-dose midodrine 8/19 Remains off pressors, CRRT ongoing. HD cath required tPA.  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 critical illness ongoing renal failure Ischemic ATN AKI from pancreatitis/shock superimposed on CKD 4 on admission leading to IHD MWF (now anuric) midodrine high doses of 40 mg 3 times daily-Jardiance losartan Kerendia discontinued-IHD started 8/21 IJ TDC by IR on 5/78--ION complications with cannulation on 8/30 which are now resolved start Renvela-periodic phos Acidosis now ok to be managed by dialysis   multilobular pneumonia with acute respiratory failure 8/14 Underlying COPD OSA Remains noncompliant on CPAP  and has been educated about this Requiring 3 weeks total Augmentin 250 ending on 05/09/2022 to be repeated on-CT chest to be repeated 9/10 Attempt to wean high flow nasal cannula to off -daily weaning trials in the outpatient setting while in hospital Continue albuterol every 4 as needed 15 twice daily Yupelri 175 daily saline nasal spray White count remains elevated-trend only does not have fevers  Acute pancreatitis on admission DKA on Admission and underlying DM TY 2 Tube feeds weaned off on 8/30-change all meds to PO  Levemir 45 units-->32 U, scheduled insulin 13 units 3 times daily and sliding scale CBGs currently ranging 100-1 52  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 Monday, Rena-Vite--transfused X1 on 8/20 Does not meet transfusion threshold as yet-periodic labs  Underlying EtOH, hep C (per patient treated) Apprently underwent rx for hep C  probable ICU delirium  Now resolved-cutting back Seroquel from 50-25 discontinue gabapentin discontinue opiates  Severe protein energy malnutrition Educated about eating drinking and mobility-discontinue calorie count seems to be tolerating 90% of meals  DVT prophylaxis: SCD Code Status: Full Family Communication: Discussed with 2 sisters at the bedside in addition to daughter [name] on the phone Disposition:  Status is: Inpatient Remains inpatient appropriate because:   Likely needs skilled as well as clip and eventual placement   Consultants:  None  Procedures: No  Antimicrobials: Augmentin   Subjective:  More awake coherent looks well No fever no chills No cough Not using BiPAP-I educated him in the presence of daughter on phone and 2 sisters No chest pain  Objective: Vitals:   05/08/22 0000 05/08/22 0400 05/08/22  2542 05/08/22 0826  BP: 99/74 92/76 107/74   Pulse: (!) 110 (!) 104 (!) 104   Resp:  20 18   Temp:  98.6 F (37 C) 98.6 F (37 C) 98.2 F (36.8 C)   TempSrc: Oral Oral Oral   SpO2: 97% 94% 90% 98%  Weight:      Height:        Intake/Output Summary (Last 24 hours) at 05/08/2022 0913 Last data filed at 05/08/2022 0747 Gross per 24 hour  Intake 240 ml  Output 3.3 ml  Net 236.7 ml    Filed Weights   05/06/22 0500 05/07/22 0326 05/07/22 2012  Weight: 119.1 kg 121.8 kg 121.8 kg    Examination:  Thick neck Mallampati 4 no JVD Dialysis access noted in the right chest S1-S2 no murmur PVCs occasionally predominantly sinus rhythm without arrhythmia Abdomen obese nontender nondistended no rebound no guarding ROM intact moving 4 limbs equally Neurologically intact Power is 5/5 and he is coherent  Data Reviewed: personally reviewed   CBC    Component Value Date/Time   WBC 12.4 (H) 05/08/2022 0618   RBC 2.93 (L) 05/08/2022 0618   HGB 7.8 (L) 05/08/2022 0618   HGB 14.0 03/16/2022 1050   HCT 25.7 (L) 05/08/2022 0618   HCT 42.4 03/16/2022 1050   PLT 377 05/08/2022 0618   PLT 258 03/16/2022 1050   MCV 87.7 05/08/2022 0618   MCV 80 03/16/2022 1050   MCH 26.6 05/08/2022 0618   MCHC 30.4 05/08/2022 0618   RDW 17.2 (H) 05/08/2022 0618   RDW 16.5 (H) 03/16/2022 1050   LYMPHSABS 1.5 05/08/2022 0618   LYMPHSABS 1.0 03/16/2022 1050   MONOABS 1.1 (H) 05/08/2022 0618   EOSABS 0.8 (H) 05/08/2022 0618   EOSABS 0.4 03/16/2022 1050   BASOSABS 0.1 05/08/2022 0618   BASOSABS 0.0 03/16/2022 1050      Latest Ref Rng & Units 05/08/2022    6:18 AM 05/07/2022   12:52 AM 05/06/2022   12:41 AM  CMP  Glucose 70 - 99 mg/dL 116  151  190   BUN 8 - 23 mg/dL 42  76  57   Creatinine 0.61 - 1.24 mg/dL 7.82  11.62  8.93   Sodium 135 - 145 mmol/L 139  135  137   Potassium 3.5 - 5.1 mmol/L 4.2  4.3  4.1   Chloride 98 - 111 mmol/L 97  92  94   CO2 22 - 32 mmol/L '23  24  26   '$ Calcium 8.9 - 10.3 mg/dL 10.2  9.4  9.1   Total Protein 6.5 - 8.1 g/dL 8.8     Total Bilirubin 0.3 - 1.2 mg/dL 0.5     Alkaline  Phos 38 - 126 U/L 78     AST 15 - 41 U/L 42     ALT 0 - 44 U/L 17        Radiology Studies: No results found.   Scheduled Meds:  sodium chloride   Intravenous Once   amoxicillin-clavulanate  500 mg Oral BID   arformoterol  15 mcg Nebulization BID   atorvastatin  80 mg Oral Daily   Chlorhexidine Gluconate Cloth  6 each Topical Daily   Chlorhexidine Gluconate Cloth  6 each Topical Q0600   Chlorhexidine Gluconate Cloth  6 each Topical Q0600   darbepoetin (ARANESP) injection - DIALYSIS  60 mcg Intravenous Q Mon-HD   feeding supplement (NEPRO CARB STEADY)  237 mL Oral TID BM   feeding supplement (PROSource  TF20)  70 mL Per Tube Daily   heparin  5,000 Units Subcutaneous Q8H   insulin aspart  1-3 Units Subcutaneous Q4H   insulin detemir  32 Units Subcutaneous Q12H   midodrine  40 mg Oral Q8H   multivitamin  1 tablet Oral QHS   mouth rinse  15 mL Mouth Rinse 4 times per day   pantoprazole  40 mg Oral QHS   QUEtiapine  25 mg Oral QHS   revefenacin  175 mcg Nebulization Daily   sevelamer carbonate  1,600 mg Oral TID WC   simethicone  80 mg Oral QID   sodium chloride flush  10-40 mL Intracatheter Q12H   Continuous Infusions:  sodium chloride Stopped (05/02/22 1645)   sodium chloride     anticoagulant sodium citrate     feeding supplement (VITAL 1.5 CAL) Stopped (05/06/22 2200)     LOS: 28 days   Time spent: [place], MD [facility] To contact the attending provider between 7A-7P or the covering provider during after hours 7P-7A, please log into the web site [email or web address] and access using universal [place] password for that web site. If you do not have the password, please call the hospital operator.  05/08/2022, 9:13 AM

## 2022-05-08 NOTE — Progress Notes (Signed)
   05/08/22 1950  Vitals  Temp 98.1 F (36.7 C)  Temp Source Oral  BP 115/78  MAP (mmHg) 89  BP Location Left Arm  BP Method Automatic  Patient Position (if appropriate) Lying  Pulse Rate Source Monitor  Resp (!) 22  Level of Consciousness  Level of Consciousness Alert  MEWS COLOR  MEWS Score Color Yellow  Oxygen Therapy  SpO2 96 %  O2 Device Nasal Cannula  Heater temperature 39.2 F (4 C)  Patient Activity (if Appropriate) In bed  Pulse Oximetry Type Continuous  MEWS Score  MEWS Temp 0  MEWS Systolic 0  MEWS Pulse 1  MEWS RR 1  MEWS LOC 0  MEWS Score 2   Day shift nurse reported pt had n/v, lethargy, malaise today. He is A & O and still c/o of nausea. Bilateral wheezes upon auscultation w/ desaturation when changing position in bed. Notified hospitalist and RT. Will admin albuterol neb and continue to monitor.

## 2022-05-09 LAB — RENAL FUNCTION PANEL
Albumin: 2.8 g/dL — ABNORMAL LOW (ref 3.5–5.0)
Anion gap: 18 — ABNORMAL HIGH (ref 5–15)
BUN: 54 mg/dL — ABNORMAL HIGH (ref 8–23)
CO2: 23 mmol/L (ref 22–32)
Calcium: 10.5 mg/dL — ABNORMAL HIGH (ref 8.9–10.3)
Chloride: 96 mmol/L — ABNORMAL LOW (ref 98–111)
Creatinine, Ser: 9.49 mg/dL — ABNORMAL HIGH (ref 0.61–1.24)
GFR, Estimated: 6 mL/min — ABNORMAL LOW (ref >60)
Glucose, Bld: 126 mg/dL — ABNORMAL HIGH (ref 70–99)
Phosphorus: 8 mg/dL — ABNORMAL HIGH (ref 2.5–4.6)
Potassium: 4.1 mmol/L (ref 3.5–5.1)
Sodium: 137 mmol/L (ref 135–145)

## 2022-05-09 LAB — TRIGLYCERIDES: Triglycerides: 269 mg/dL — ABNORMAL HIGH (ref <150)

## 2022-05-09 LAB — GLUCOSE, CAPILLARY
Glucose-Capillary: 115 mg/dL — ABNORMAL HIGH (ref 70–99)
Glucose-Capillary: 120 mg/dL — ABNORMAL HIGH (ref 70–99)
Glucose-Capillary: 153 mg/dL — ABNORMAL HIGH (ref 70–99)
Glucose-Capillary: 142 mg/dL — ABNORMAL HIGH (ref 70–99)
Glucose-Capillary: 127 mg/dL — ABNORMAL HIGH (ref 70–99)

## 2022-05-09 LAB — MAGNESIUM: Magnesium: 2 mg/dL (ref 1.7–2.4)

## 2022-05-09 MED ORDER — INSULIN ASPART 100 UNIT/ML IJ SOLN
1.0000 [IU] | Freq: Three times a day (TID) | INTRAMUSCULAR | Status: DC
  Administered 2022-05-09: 1 [IU] via SUBCUTANEOUS
  Administered 2022-05-09 – 2022-05-10 (×2): 2 [IU] via SUBCUTANEOUS
  Administered 2022-05-11 – 2022-05-12 (×5): 1 [IU] via SUBCUTANEOUS

## 2022-05-09 MED ORDER — SEVELAMER CARBONATE 800 MG PO TABS
2400.0000 mg | ORAL_TABLET | Freq: Three times a day (TID) | ORAL | Status: DC
  Administered 2022-05-09 – 2022-05-13 (×12): 2400 mg via ORAL
  Filled 2022-05-09 (×11): qty 3

## 2022-05-09 MED ORDER — CHLORHEXIDINE GLUCONATE CLOTH 2 % EX PADS: 6.0000 | MEDICATED_PAD | Freq: Every day | CUTANEOUS | Status: DC

## 2022-05-09 NOTE — Progress Notes (Signed)
PROGRESS NOTE   Mathew Lara  VZD:[id number] DOB: 02-15-1961 DOA: 04/10/2022 PCP: [name], MD  Brief Narrative:  61 year old black male known history of EtOH hep C HLD DM TY 2 HTN COPD Initial presentation [facility] 04/10/2022 epigastric pain radiating down lower abdomen cramping in nature associated with nausea--findings consistent with acute pancreatitis when admitted ./Creatinine was 41/3.0-18 8/6 overnight developed respiratory distress as well as shock placed on Levophed DKA was being treated 8/7 On pressors  8/8 BiPAP, worsening renal parameters 8/9 Tolerating CRRT, on BiPAP 8/10 has been off BiPAP, on high flow oxygen and tolerating 8/11 UOP improving. CRRT with net even UF 8/14 CT reveals worsening cavitary pneumonia and acute pancreatitis, large BM 8/15 abx changed for cavitary pneumonia 8/16 ID consult.  ID stopped vancomycin, Zosyn, azithromycin >narrowed to augmentin. CRRT stopped. 8/17 CRRT restarted.  Off pressors but placed on very high-dose midodrine 8/19 Remains off pressors, CRRT ongoing. HD cath required tPA.  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 -->IHD MWF (now anuric) since 8/21 Needs midodrine high doses of 40 mg 3 times daily-Jardiance losartan Kerendia discontinued IJ TDC by IR on 2/95-- complications with cannulation on 8/30 resolved Increasing Renvela 2400 with meals-periodic phos Acidosis now ok to be managed by dialysis   multilobular pneumonia with acute respiratory failure 8/14 Underlying COPD OSA--- requiring 2 to 3 L of oxygen at baseline Encouraged  compliance with CPAP 3 weeks total Augmentin 250 ending 05/09/2022 to be repeated on-CT chest to be repeated 9/10 -daily weaning trials  Continue albuterol every 4 as needed 15 twice daily Yupelri 175 daily saline nasal spray White count remains elevated-trend in am Had episodic SOB DOE 9/2 PM secondary to him being off of oxygen-chest x-ray relatively unchanged from prior  Acute pancreatitis on admission DKA on Admission and underlying DM TY 2 Pulled out core track 8/30-change all meds to PO  Levemir 45 units-->32 U, and sliding scale--- scheduled insulin held CBGs currently ranging 120 range  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 Monday, Rena-Vite--transfused X1 on 8/20  Underlying EtOH, hep C (per patient treated) Apprently underwent rx for hep C  probable ICU delirium  Now resolved-cutting back Seroquel from 50-25 discontinue gabapentin discontinue opiates  Severe protein energy malnutrition Educated about eating drinking and mobility-discontinue calorie count seems to be tolerating 90% of meals  DVT prophylaxis: SCD Code Status: Full Family Communication: Discussed with 2 sisters at the bedside in addition to daughter [name] on 9/2-no family today Disposition:  Status is: Inpatient Remains inpatient appropriate because:   Likely needs skilled as well as clip and eventual placement   Consultants:  None  Procedures: No  Antimicrobials: Augmentin   Subjective:  Upset about not eating dinner last night Overall fair Looks good wants to get up out of bed and mobilize Events noted from 9/2 PM he was little bit more short of breath sounds like because he took off his oxygen but he has no new symptoms   Objective: Vitals:   05/08/22  2353 05/09/22 0226 05/09/22 0537 05/09/22 0730  BP: 110/78 112/75 113/76   Pulse: (!) 113 (!) 103 (!) 108   Resp: '20 20 20   '$ Temp:  97.9 F (36.6 C) 98.5 F (36.9 C) 98.7 F (37.1 C)   TempSrc: Oral Oral Oral   SpO2: 97% 98% 100% 96%  Weight:      Height:        Intake/Output Summary (Last 24 hours) at 05/09/2022 0913 Last data filed at 05/08/2022 2131 Gross per 24 hour  Intake 480 ml  Output --  Net 480 ml    Filed Weights   05/06/22 0500 05/07/22 0326 05/07/22 2012  Weight: 119.1 kg 121.8 kg 121.8 kg    Examination:  Thick neck Mallampati 4 Dialysis access in right chest Chest is clear otherwise no rales rhonchi wheeze ROM is intact moving 4 limbs without deficit Abdomen is obese nontender no rebound no organomegaly Neurologically grossly intact Skin is soft supple  Data Reviewed: personally reviewed   CBC    Component Value Date/Time   WBC 12.4 (H) 05/08/2022 0618   RBC 2.93 (L) 05/08/2022 0618   HGB 7.8 (L) 05/08/2022 0618   HGB 14.0 03/16/2022 1050   HCT 25.7 (L) 05/08/2022 0618   HCT 42.4 03/16/2022 1050   PLT 377 05/08/2022 0618   PLT 258 03/16/2022 1050   MCV 87.7 05/08/2022 0618   MCV 80 03/16/2022 1050   MCH 26.6 05/08/2022 0618   MCHC 30.4 05/08/2022 0618   RDW 17.2 (H) 05/08/2022 0618   RDW 16.5 (H) 03/16/2022 1050   LYMPHSABS 1.5 05/08/2022 0618   LYMPHSABS 1.0 03/16/2022 1050   MONOABS 1.1 (H) 05/08/2022 0618   EOSABS 0.8 (H) 05/08/2022 0618   EOSABS 0.4 03/16/2022 1050   BASOSABS 0.1 05/08/2022 0618   BASOSABS 0.0 03/16/2022 1050      Latest Ref Rng & Units 05/09/2022   12:22 AM 05/08/2022    6:18 AM 05/07/2022   12:52 AM  CMP  Glucose 70 - 99 mg/dL 126  116  151   BUN 8 - 23 mg/dL 54  42  76   Creatinine 0.61 - 1.24 mg/dL 9.49  7.82  11.62   Sodium 135 - 145 mmol/L 137  139  135   Potassium 3.5 - 5.1 mmol/L 4.1  4.2  4.3   Chloride 98 - 111 mmol/L 96  97  92   CO2 22 - 32 mmol/L '23  23  24   '$ Calcium 8.9 - 10.3 mg/dL 10.5  10.2  9.4   Total Protein 6.5 - 8.1 g/dL  8.8    Total Bilirubin 0.3 - 1.2 mg/dL  0.5    Alkaline Phos 38 - 126 U/L  78    AST 15 - 41 U/L  42     ALT 0 - 44 U/L  17       Radiology Studies: DG Chest 2 View  Result Date: 05/08/2022 CLINICAL DATA:  Dyspnea on exertion EXAM: CHEST - 2 VIEW COMPARISON:  05/05/2022 FINDINGS: No significant change in low volume chest radiographs. Cardiomegaly with diffuse bilateral interstitial pulmonary opacity. Left chest large bore multi lumen vascular catheter. Osseous structures unremarkable. IMPRESSION: No significant change in low volume examination. Cardiomegaly with diffuse bilateral interstitial pulmonary opacity, likely edema. No new airspace opacity. Electronically Signed   By: Delanna Ahmadi M.D.   On: 05/08/2022 19:44     Scheduled Meds:  sodium chloride   Intravenous Once   amoxicillin-clavulanate  500  mg Oral BID   arformoterol  15 mcg Nebulization BID   atorvastatin  80 mg Oral Daily   Chlorhexidine Gluconate Cloth  6 each Topical Daily   Chlorhexidine Gluconate Cloth  6 each Topical Q0600   Chlorhexidine Gluconate Cloth  6 each Topical Q0600   darbepoetin (ARANESP) injection - DIALYSIS  60 mcg Intravenous Q Mon-HD   feeding supplement (NEPRO CARB STEADY)  237 mL Oral TID BM   feeding supplement (PROSource TF20)  70 mL Per Tube Daily   heparin  5,000 Units Subcutaneous Q8H   insulin aspart  1-3 Units Subcutaneous Q4H   insulin detemir  32 Units Subcutaneous Q12H   midodrine  40 mg Oral Q8H   multivitamin  1 tablet Oral QHS   mouth rinse  15 mL Mouth Rinse 4 times per day   pantoprazole  40 mg Oral QHS   QUEtiapine  25 mg Oral QHS   revefenacin  175 mcg Nebulization Daily   sevelamer carbonate  1,600 mg Oral TID WC   simethicone  80 mg Oral QID   sodium chloride flush  10-40 mL Intracatheter Q12H   Continuous Infusions:  sodium chloride Stopped (05/02/22 1645)   sodium chloride     anticoagulant sodium citrate     feeding supplement (VITAL 1.5 CAL) Stopped (05/06/22 2200)     LOS: 29 days   Time spent: [place], MD [facility] To contact the  attending provider between 7A-7P or the covering provider during after hours 7P-7A, please log into the web site [email or web address] and access using universal [place] password for that web site. If you do not have the password, please call the hospital operator.  05/09/2022, 9:13 AM

## 2022-05-09 NOTE — Progress Notes (Signed)
[facility] NEPHROLOGY PROGRESS NOTE  Assessment/ Plan:  # Anuric dialysis dependent AKI/CKD stage IIIb-IV - presumably ischemic ATN in setting of acute pancreatitis, hypotension, pressor support, with concomitant ARB, SGLT-2 inhibitor and finerenone.  Initially non-oliguric but now oligo-anuric.  -Continue to hold losartan, Jardiance, and Kerendia.   -Started on CRRT 04/13/22 - 04/25/22.  Started iHD 8/21. -s/p TIJ TDC placed by IR on 8/29. -Status post HD on 9/1 with around 2.3 L ultrafiltration. -Anuric and no sign of renal recovery so far.  We will plan to do next dialysis tomorrow.  He will need outpatient HD arrangement for AKI. -Continue daily labs and a strict ins and out to monitor renal function.  Daily assessment for HD needed.  # Shock -on off pressors per PCCM; on very high dose of midodrine.  Blood pressure remains low.  #Acute hypoxic respiratory failure - with underlying COPD and OSA.  Currently on oxygen via nasal cannula.  Manage volume with HD.  #Hep C with advanced liver fibrosis.  #Chronic diastolic CHF - stable.  #Anemia; transfuse prn.  Iron saturation low with high ferritin because of inflammatory state.  Started ESA, receiving blood transfusion intermittently.  #Hyperphosphatemia: Increased sevelamer dose, continue renal/low Phos diet.  #Acute metabolic encephalopathy, possible ICU delirium.  Mental status seems to be better today.  # Acute pancreatitis: resolved  Subjective: Seen and examined at the bedside.  No urine output documented.  He denies chest pain, shortness of breath, nausea, vomiting, dysgeusia.  Noted BP running soft.  Objective Vital signs in last 24 hours: Vitals:   05/09/22 0537 05/09/22 0730 05/09/22 0800 05/09/22 0928  BP: 113/76  102/80   Pulse: (!) 108  (!) 105   Resp: 20  18   Temp: 98.7 F (37.1 C)  (!) 97.5 F (36.4 C)   TempSrc: Oral  Oral   SpO2: 100% 96% 99% 96%  Weight:      Height:       Weight change:    Intake/Output Summary (Last 24 hours) at 05/09/2022 1035 Last data filed at 05/08/2022 2131 Gross per 24 hour  Intake 480 ml  Output --  Net 480 ml        Labs: RENAL PANEL Recent Labs    04/26/22 1816 04/27/22 0516 04/27/22 1536 04/28/22 0338 04/28/22 1550 04/29/22 0332 04/29/22 1515 04/30/22 0317 04/30/22 1255 05/01/22 0600 05/01/22 0946 05/02/22 0045 05/02/22 0051 05/03/22 0111 05/03/22 0920 05/04/22 0044 05/04/22 0829 05/05/22 0220 05/06/22 0041 05/07/22 0052 05/08/22 0618 05/09/22 0022  NA 132* 131* 134* 133* 131* 128*   < > 131*   < > 132* 133* 133*  --   --  135  --  134* 135 137 135 139 137  K 3.9 4.4 4.5 4.7 4.1 4.4   < > 4.1   < > 4.0 4.0 4.5  --   --  5.1  --  5.0 4.7 4.1 4.3 4.2 4.1  CL 93* 94* 95* 95* 91* 89*   < > 91*   < > 91* 91* 92*  --   --  92*  --  93* 94* 94* 92* 97* 96*  CO2 '26 26 25 23 24 24   '$ < > 24   < > 24 24 20*  --   --  22  --  '22 22 26 24 23 23  '$ GLUCOSE 173* 197* 257* 290* 305* 417*   < > 235*   < > 189* 223* 241*  --   --  150*  --  132* 175* 190* 151* 116* 126*  BUN 26* 36* 46* 58* 33* 42*   < > 60*   < > 41* 40* 58*  --   --  81*  --  95* 100* 57* 76* 42* 54*  CREATININE 3.94* 5.69* 6.89* 8.60* 5.69* 7.13*   < > 9.21*   < > 6.77* 7.24* 8.72*  --   --  11.30*  --  12.73* 13.23* 8.93* 11.62* 7.82* 9.49*  CALCIUM 8.9 8.9 9.4 9.4 9.0 9.0   < > 9.4   < > 9.7 9.7 9.7  --   --  10.1  --  9.8 9.3 9.1 9.4 10.2 10.5*  MG  --  2.3  --  2.6*  --  2.1  --  2.4  --  1.9  --   --  2.2 2.4  --  2.4  --  2.2 1.9 2.2 1.8 2.0  PHOS 3.1 5.5* 6.2* 6.6* 4.9* 5.3*  --   --   --   --   --  6.3*  --   --   --   --   --   --  5.9* 9.4*  --  8.0*  ALBUMIN 2.3* 2.3* 2.3* 2.4* 2.4* 2.2*  --   --   --   --   --  2.6*  --   --   --   --   --   --  2.4* 2.6* 2.8* 2.8*   < > = values in this interval not displayed.      Liver Function Tests: Recent Labs  Lab 05/07/22 0052 05/08/22 0618 05/09/22 0022  AST  --  42*  --   ALT  --  17  --   ALKPHOS  --  78  --    BILITOT  --  0.5  --   PROT  --  8.8*  --   ALBUMIN 2.6* 2.8* 2.8*    No results for input(s): "LIPASE", "AMYLASE" in the last 168 hours. No results for input(s): "AMMONIA" in the last 168 hours. CBC: Recent Labs    05/01/22 0600 05/01/22 0946 05/02/22 0051 05/04/22 0829 05/05/22 0220 05/06/22 0041 05/07/22 1631 05/08/22 0618  HGB 7.7*  --    < > 7.3* 7.1* 7.2* 7.1* 7.8*  MCV 84.3  --    < >  --  86.3 87.7 88.2 87.7  VITAMINB12  --   --   --   --   --  571  --   --   FOLATE  --   --   --   --   --  23.7  --   --   FERRITIN 1,149*  --   --   --   --  1,477*  --   --   TIBC  --  284  --   --   --  298  --   --   IRON  --  35*  --   --   --  27*  --   --   RETICCTPCT  --   --   --   --   --  2.5  --   --    < > = values in this interval not displayed.     Cardiac Enzymes: No results for input(s): "CKTOTAL", "CKMB", "CKMBINDEX", "TROPONINI" in the last 168 hours. CBG: Recent Labs  Lab 05/08/22 1937 05/08/22 1949 05/08/22 2320 05/09/22 0355 05/09/22 0748  GLUCAP 121* 133* 121*  115* 120*     Iron Studies:  No results for input(s): "IRON", "TIBC", "TRANSFERRIN", "FERRITIN" in the last 72 hours.  Studies/Results: DG Chest 2 View  Result Date: 05/08/2022 CLINICAL DATA:  Dyspnea on exertion EXAM: CHEST - 2 VIEW COMPARISON:  05/05/2022 FINDINGS: No significant change in low volume chest radiographs. Cardiomegaly with diffuse bilateral interstitial pulmonary opacity. Left chest large bore multi lumen vascular catheter. Osseous structures unremarkable. IMPRESSION: No significant change in low volume examination. Cardiomegaly with diffuse bilateral interstitial pulmonary opacity, likely edema. No new airspace opacity. Electronically Signed   By: Delanna Ahmadi M.D.   On: 05/08/2022 19:44    Medications: Infusions:  sodium chloride Stopped (05/02/22 1645)   sodium chloride     anticoagulant sodium citrate     feeding supplement (VITAL 1.5 CAL) Stopped (05/06/22 2200)     Scheduled Medications:  sodium chloride   Intravenous Once   amoxicillin-clavulanate  500 mg Oral BID   arformoterol  15 mcg Nebulization BID   atorvastatin  80 mg Oral Daily   Chlorhexidine Gluconate Cloth  6 each Topical Daily   Chlorhexidine Gluconate Cloth  6 each Topical Q0600   Chlorhexidine Gluconate Cloth  6 each Topical Q0600   darbepoetin (ARANESP) injection - DIALYSIS  60 mcg Intravenous Q Mon-HD   feeding supplement (NEPRO CARB STEADY)  237 mL Oral TID BM   feeding supplement (PROSource TF20)  70 mL Per Tube Daily   heparin  5,000 Units Subcutaneous Q8H   insulin aspart  1-3 Units Subcutaneous TID WC   insulin detemir  32 Units Subcutaneous Q12H   midodrine  40 mg Oral Q8H   multivitamin  1 tablet Oral QHS   mouth rinse  15 mL Mouth Rinse 4 times per day   pantoprazole  40 mg Oral QHS   QUEtiapine  25 mg Oral QHS   revefenacin  175 mcg Nebulization Daily   sevelamer carbonate  2,400 mg Oral TID WC   simethicone  80 mg Oral QID   sodium chloride flush  10-40 mL Intracatheter Q12H    have reviewed scheduled and prn medications.  Physical Exam: General: Alert awake and able to lie flat.Marland Kitchen Heart:RRR, s1s2 nl Lungs: Clear bilateral, no wheeze Abdomen:soft, Non-tender, non-distended Extremities: Bilateral leg edema present+ Dialysis Access: Right IJ TDC in place, site clean.  Lasandra Batley Prasad Lanetra Hartley 05/09/2022,10:35 AM  LOS: 29 days

## 2022-05-10 LAB — PHOSPHORUS: Phosphorus: 10 mg/dL — ABNORMAL HIGH (ref 2.5–4.6)

## 2022-05-10 LAB — COMPREHENSIVE METABOLIC PANEL
ALT: 13 U/L (ref 0–44)
AST: 31 U/L (ref 15–41)
Albumin: 2.9 g/dL — ABNORMAL LOW (ref 3.5–5.0)
Alkaline Phosphatase: 80 U/L (ref 38–126)
Anion gap: 20 — ABNORMAL HIGH (ref 5–15)
BUN: 70 mg/dL — ABNORMAL HIGH (ref 8–23)
CO2: 22 mmol/L (ref 22–32)
Calcium: 10.3 mg/dL (ref 8.9–10.3)
Chloride: 95 mmol/L — ABNORMAL LOW (ref 98–111)
Creatinine, Ser: 11.7 mg/dL — ABNORMAL HIGH (ref 0.61–1.24)
GFR, Estimated: 4 mL/min — ABNORMAL LOW (ref >60)
Glucose, Bld: 115 mg/dL — ABNORMAL HIGH (ref 70–99)
Potassium: 4.4 mmol/L (ref 3.5–5.1)
Sodium: 137 mmol/L (ref 135–145)
Total Bilirubin: 0.5 mg/dL (ref 0.3–1.2)
Total Protein: 9 g/dL — ABNORMAL HIGH (ref 6.5–8.1)

## 2022-05-10 LAB — CBC
HCT: 26 % — ABNORMAL LOW (ref 39.0–52.0)
Hemoglobin: 8 g/dL — ABNORMAL LOW (ref 13.0–17.0)
MCH: 26.6 pg (ref 26.0–34.0)
MCHC: 30.8 g/dL (ref 30.0–36.0)
MCV: 86.4 fL (ref 80.0–100.0)
Platelets: 378 10*3/uL (ref 150–400)
RBC: 3.01 MIL/uL — ABNORMAL LOW (ref 4.22–5.81)
RDW: 16.9 % — ABNORMAL HIGH (ref 11.5–15.5)
WBC: 14.3 10*3/uL — ABNORMAL HIGH (ref 4.0–10.5)
nRBC: 0 % (ref 0.0–0.2)

## 2022-05-10 LAB — GLUCOSE, CAPILLARY
Glucose-Capillary: 108 mg/dL — ABNORMAL HIGH (ref 70–99)
Glucose-Capillary: 108 mg/dL — ABNORMAL HIGH (ref 70–99)
Glucose-Capillary: 111 mg/dL — ABNORMAL HIGH (ref 70–99)
Glucose-Capillary: 118 mg/dL — ABNORMAL HIGH (ref 70–99)
Glucose-Capillary: 168 mg/dL — ABNORMAL HIGH (ref 70–99)
Glucose-Capillary: 236 mg/dL — ABNORMAL HIGH (ref 70–99)

## 2022-05-10 LAB — MAGNESIUM: Magnesium: 2 mg/dL (ref 1.7–2.4)

## 2022-05-10 MED ORDER — HEPARIN SODIUM (PORCINE) 1000 UNIT/ML DIALYSIS
20.0000 [IU]/kg | INTRAMUSCULAR | Status: DC | PRN
  Administered 2022-05-10: 3000 [IU] via INTRAVENOUS_CENTRAL
  Filled 2022-05-10 (×2): qty 3

## 2022-05-10 MED ORDER — LIDOCAINE HCL (PF) 1 % IJ SOLN: 5.0000 mL | INTRAMUSCULAR | Status: DC | PRN

## 2022-05-10 MED ORDER — HEPARIN SODIUM (PORCINE) 1000 UNIT/ML IJ SOLN
1000.0000 [IU] | INTRAMUSCULAR | Status: DC | PRN
  Administered 2022-05-10: 4000 [IU]

## 2022-05-10 MED ORDER — LIDOCAINE-PRILOCAINE 2.5-2.5 % EX CREA: 1.0000 | TOPICAL_CREAM | CUTANEOUS | Status: DC | PRN

## 2022-05-10 MED ORDER — ALTEPLASE 2 MG IJ SOLR: 2.0000 mg | Freq: Once | INTRAMUSCULAR | Status: DC | PRN

## 2022-05-10 MED ORDER — DARBEPOETIN ALFA 100 MCG/0.5ML IJ SOSY
100.0000 ug | PREFILLED_SYRINGE | INTRAMUSCULAR | Status: DC
  Administered 2022-05-10: 100 ug via INTRAVENOUS
  Filled 2022-05-10: qty 0.5

## 2022-05-10 MED ORDER — HEPARIN SODIUM (PORCINE) 1000 UNIT/ML IJ SOLN
INTRAMUSCULAR | Status: AC
  Filled 2022-05-10: qty 4

## 2022-05-10 MED ORDER — ALBUMIN HUMAN 25 % IV SOLN
25.0000 g | Freq: Once | INTRAVENOUS | Status: AC
  Administered 2022-05-10: 25 g via INTRAVENOUS

## 2022-05-10 MED ORDER — PENTAFLUOROPROP-TETRAFLUOROETH EX AERO: 1.0000 | INHALATION_SPRAY | CUTANEOUS | Status: DC | PRN

## 2022-05-10 MED ORDER — ALBUMIN HUMAN 25 % IV SOLN
INTRAVENOUS | Status: AC
  Filled 2022-05-10: qty 100

## 2022-05-10 MED ORDER — ANTICOAGULANT SODIUM CITRATE 4% (200MG/5ML) IV SOLN: 5.0000 mL | Status: DC | PRN

## 2022-05-10 NOTE — TOC Progression Note (Addendum)
Transition of Care [facility]) - Progression Note    Patient Details  Name: FRASER BUSCHE MRN: [id number] Date of Birth: 1960/10/24  Transition of Care [facility]) CM/SW [place], [place] Phone Number: 05/10/2022, 2:55 PM  Clinical Narrative:      CSW provided patient with SNF bed offers at bedside. Patient called his daughter [name] and wanted CSW to speak with her. CSW spoke with [name] and provided SNF bed offer for patient. Patient and [name] both in agreement to accept SNF bed offer with [facility]. CSW called Debbie with [facility] and LVM. CSW awaiting callback to confirm SNF bed offer. Once confirmed CSW to follow up with renal navigator.CSW following to start insurance authorization. CSW will continue to follow and assist with patients dc planning needs.    Barriers to Discharge: Continued Medical Work up  Expected Discharge Plan and Services                                                 Social Determinants of Health (SDOH) Interventions    Readmission Risk Interventions     No data to display

## 2022-05-10 NOTE — Progress Notes (Signed)
[facility] Progress Note  Name: Mathew Lara MRN: [id number] DOB: 06/10/1961   Subjective:  Seen and examined on dialysis.  Procedure supervised. BP 103/75 on HD. he is out of UF and has gotten saline bolus x 2 per nursing.  He had no urine output charted over 9/3.  He is on 4 liters oxygen here and tells me on 2 liters at home.  He hasn't yet had aranesp yet today and spoke with RN re: same.   Review of systems:  Denies n/v Breathing doing pretty good  Denies chest pain  No intake or output data in the 24 hours ending 05/10/22 1016  Vitals:  Vitals:   05/10/22 0830 05/10/22 0900 05/10/22 0930 05/10/22 1000  BP: 121/86 91/69 (!) 114/91 109/88  Pulse: (!) 101 (!) 115 99   Resp:   16 16  Temp:      TempSrc:      SpO2: 100% 98% 100% 100%  Weight:      Height:         Physical Exam:  General adult male in bed in no acute distress HEENT normocephalic atraumatic extraocular movements intact sclera anicteric Neck supple trachea midline Lungs clear to auscultation bilaterally normal work of breathing at rest on 4 liters oxygen Heart S1S2 no rub Abdomen soft nontender nondistended Extremities no edema  Psych normal mood and affect Access left IJ tunn catheter  Medications reviewed   Labs:     Latest Ref Rng & Units 05/10/2022    3:15 AM 05/09/2022   12:22 AM 05/08/2022    6:18 AM  BMP  Glucose 70 - 99 mg/dL 115  126  116   BUN 8 - 23 mg/dL 70  54  42   Creatinine 0.61 - 1.24 mg/dL 11.70  9.49  7.82   Sodium 135 - 145 mmol/L 137  137  139   Potassium 3.5 - 5.1 mmol/L 4.4  4.1  4.2   Chloride 98 - 111 mmol/L 95  96  97   CO2 22 - 32 mmol/L '22  23  23   '$ Calcium 8.9 - 10.3 mg/dL 10.3  10.5  10.2      Assessment/Plan:    # Anuric dialysis dependent AKI/CKD stage IIIb-IV - presumably ischemic ATN in setting of acute pancreatitis, hypotension, pressor support, with concomitant ARB, SGLT-2 inhibitor and finerenone.  Initially non-oliguric but now  oligo-anuric. Started on CRRT 04/13/22 - 04/25/22.  Started iHD 8/21.  s/p TIJ TDC placed by IR on 8/29. -Continue to hold losartan, Jardiance, and Kerendia.   - HD today - Tomorrow will contact HD SW to [facility].  He will need outpatient HD arrangement for AKI.   # Shock -on off pressors per PCCM; on very high dose of midodrine (dose is per primary and previous team - I would be ok with tapering to a more standard dose - ie. Starting taper at midodrine 30 mg TID. ).    #Acute hypoxic respiratory failure - with underlying COPD and OSA.  Currently on oxygen via nasal cannula.  optimize volume with HD.   #Hep C with advanced liver fibrosis.   #Chronic diastolic CHF - optimize volume status with HD   #Anemia normocytic; transfuse prn.  Iron saturation low with high ferritin because of inflammatory state.  Continue aranesp - increase dose to 100 mcg every Monday      #Hyperphosphatemia: note increased sevelamer dose. On nepro and renal diet. Getting HD today.   #Acute metabolic encephalopathy,  possible ICU delirium.   # Acute pancreatitis: resolved  Disposition - continue inpatient monitoring.  AKI on HD and needs to secure an outpatient HD unit before he is able to be discharged  Claudia Desanctis, MD 05/10/2022 10:33 AM

## 2022-05-10 NOTE — Progress Notes (Signed)
PROGRESS NOTE   Mathew Lara  ZOX:[id number] DOB: 1961/01/26 DOA: 04/10/2022 PCP: [name], MD  Brief Narrative:  61 year old black male known history of EtOH hep C HLD DM TY 2 HTN COPD Initial presentation [facility] 04/10/2022 epigastric pain radiating down lower abdomen cramping in nature associated with nausea--findings consistent with acute pancreatitis when admitted ./Creatinine was 41/3.0-18 8/6 overnight developed respiratory distress as well as shock placed on Levophed DKA was being treated 8/7 On pressors  8/8 BiPAP, worsening renal parameters 8/9 Tolerating CRRT, on BiPAP 8/10 has been off BiPAP, on high flow oxygen and tolerating 8/11 UOP improving. CRRT with net even UF 8/14 CT reveals worsening cavitary pneumonia and acute pancreatitis, large BM 8/15 abx changed for cavitary pneumonia 8/16 ID consult.  ID stopped vancomycin, Zosyn, azithromycin >narrowed to augmentin. CRRT stopped. 8/17 CRRT restarted.  Off pressors but placed on very high-dose midodrine 8/19 Remains off pressors, CRRT ongoing. HD cath required tPA.  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 -->IHD MWF (now anuric) since 8/21 Needs midodrine high doses of 40 mg 3 times daily-Jardiance losartan Kerendia discontinued this admission IJ TDC by IR on 8/11-- complications with cannulation on 8/30 resolved Increasing Renvela 2400 with meals-defer further adjustment of phosphorus to renal Acidosis now ok-- to be managed by dialysis   multilobular pneumonia with acute respiratory failure 8/14 Underlying COPD OSA---  requiring 2 to 3 L of oxygen at baseline Encouraged compliance with CPAP 3 weeks total Augmentin 250 ending 05/09/2022 to be repeated on-CT chest to be repeated 9/10 -daily weaning trials  Continue albuterol every 4 as needed 15 twice daily Yupelri 175 daily saline nasal spray White count remains elevated-trend in am episodic SOB DOE 9/2 PM when off of oxygen-chest x-ray unchanged from prior  Acute pancreatitis on admission DKA on Admission and underlying DM TY 2 Pulled out core track 8/30-change all meds to PO  Levemir 45 units-->32 U, and sliding scale--- scheduled insulin held CBGs 100-1 20  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 Monday, Rena-Vite--transfused X1 on 8/20  Underlying EtOH, hep C (per patient treated) Apprently underwent rx for hep C  probable ICU delirium  Now resolved-cutting back Seroquel from 50-->25 discontinue gabapentin discontinue opiates  Severe protein energy malnutrition Educated about eating drinking and mobility-discontinue calorie count seems to be tolerating 90% of meals  DVT prophylaxis: SCD Code Status: Full Family Communication:  Disposition:  Status is: Inpatient Remains inpatient appropriate because:   Likely needs skilled as well as clip and eventual placement   Consultants:  None  Procedures: No  Antimicrobials: Augmentin   Subjective:  Patient is well coherent seen on HD unit and seems motivated to work with therapy services--states that he walked about the room yesterday No chest pain no fever and no further desaturation  Objective: Vitals:   05/10/22 0820 05/10/22 0822 05/10/22 0830 05/10/22 0900  BP: 125/78 121/86 121/86 91/69  Pulse: 90 94 (!) 101 (!) 115  Resp: 16 16    Temp: 98.6 F (  37 C)     TempSrc: Oral     SpO2: 99% 100% 100% 98%  Weight: 117.4 kg     Height:       No intake or output data in the 24 hours  ending 05/10/22 0928  Filed Weights   05/07/22 0326 05/07/22 2012 05/10/22 0820  Weight: 121.8 kg 121.8 kg 117.4 kg    Examination:  EOMI NCAT no focal deficit CTA B no wheeze rales rhonchi ROM intact Abdomen obese nontender no rebound Getting HD and fistula in the left arm No lower extremity edema Neurologically intact  Data Reviewed: personally reviewed   CBC    Component Value Date/Time   WBC 14.3 (H) 05/10/2022 0315   RBC 3.01 (L) 05/10/2022 0315   HGB 8.0 (L) 05/10/2022 0315   HGB 14.0 03/16/2022 1050   HCT 26.0 (L) 05/10/2022 0315   HCT 42.4 03/16/2022 1050   PLT 378 05/10/2022 0315   PLT 258 03/16/2022 1050   MCV 86.4 05/10/2022 0315   MCV 80 03/16/2022 1050   MCH 26.6 05/10/2022 0315   MCHC 30.8 05/10/2022 0315   RDW 16.9 (H) 05/10/2022 0315   RDW 16.5 (H) 03/16/2022 1050   LYMPHSABS 1.5 05/08/2022 0618   LYMPHSABS 1.0 03/16/2022 1050   MONOABS 1.1 (H) 05/08/2022 0618   EOSABS 0.8 (H) 05/08/2022 0618   EOSABS 0.4 03/16/2022 1050   BASOSABS 0.1 05/08/2022 0618   BASOSABS 0.0 03/16/2022 1050      Latest Ref Rng & Units 05/10/2022    3:15 AM 05/09/2022   12:22 AM 05/08/2022    6:18 AM  CMP  Glucose 70 - 99 mg/dL 115  126  116   BUN 8 - 23 mg/dL 70  54  42   Creatinine 0.61 - 1.24 mg/dL 11.70  9.49  7.82   Sodium 135 - 145 mmol/L 137  137  139   Potassium 3.5 - 5.1 mmol/L 4.4  4.1  4.2   Chloride 98 - 111 mmol/L 95  96  97   CO2 22 - 32 mmol/L '22  23  23   '$ Calcium 8.9 - 10.3 mg/dL 10.3  10.5  10.2   Total Protein 6.5 - 8.1 g/dL 9.0   8.8   Total Bilirubin 0.3 - 1.2 mg/dL 0.5   0.5   Alkaline Phos 38 - 126 U/L 80   78   AST 15 - 41 U/L 31   42   ALT 0 - 44 U/L 13   17      Radiology Studies: DG Chest 2 View  Result Date: 05/08/2022 CLINICAL DATA:  Dyspnea on exertion EXAM: CHEST - 2 VIEW COMPARISON:  05/05/2022 FINDINGS: No significant change in low volume chest radiographs. Cardiomegaly with diffuse bilateral interstitial pulmonary opacity. Left chest  large bore multi lumen vascular catheter. Osseous structures unremarkable. IMPRESSION: No significant change in low volume examination. Cardiomegaly with diffuse bilateral interstitial pulmonary opacity, likely edema. No new airspace opacity. Electronically Signed   By: Delanna Ahmadi M.D.   On: 05/08/2022 19:44     Scheduled Meds:  sodium chloride   Intravenous Once   amoxicillin-clavulanate  500 mg Oral BID   arformoterol  15 mcg Nebulization BID   atorvastatin  80 mg Oral Daily   Chlorhexidine Gluconate Cloth  6 each Topical Daily   Chlorhexidine Gluconate Cloth  6 each Topical Q0600   Chlorhexidine Gluconate Cloth  6 each Topical Q0600   darbepoetin (ARANESP) injection - DIALYSIS  60 mcg  Intravenous Q Mon-HD   feeding supplement (NEPRO CARB STEADY)  237 mL Oral TID BM   feeding supplement (PROSource TF20)  70 mL Per Tube Daily   heparin  5,000 Units Subcutaneous Q8H   heparin sodium (porcine)       insulin aspart  1-3 Units Subcutaneous TID WC   insulin detemir  32 Units Subcutaneous Q12H   midodrine  40 mg Oral Q8H   multivitamin  1 tablet Oral QHS   mouth rinse  15 mL Mouth Rinse 4 times per day   pantoprazole  40 mg Oral QHS   QUEtiapine  25 mg Oral QHS   revefenacin  175 mcg Nebulization Daily   sevelamer carbonate  2,400 mg Oral TID WC   simethicone  80 mg Oral QID   sodium chloride flush  10-40 mL Intracatheter Q12H   Continuous Infusions:  sodium chloride Stopped (05/02/22 1645)   sodium chloride     anticoagulant sodium citrate     anticoagulant sodium citrate     feeding supplement (VITAL 1.5 CAL) Stopped (05/06/22 2200)     LOS: 30 days   Time spent: [place], MD [facility] To contact the attending provider between 7A-7P or the covering provider during after hours 7P-7A, please log into the web site [email or web address] and access using universal [place] password for that web site. If you do not have the password, please call the hospital  operator.  05/10/2022, 9:28 AM

## 2022-05-11 LAB — CBC
HCT: 25.2 % — ABNORMAL LOW (ref 39.0–52.0)
Hemoglobin: 7.5 g/dL — ABNORMAL LOW (ref 13.0–17.0)
MCH: 26.4 pg (ref 26.0–34.0)
MCHC: 29.8 g/dL — ABNORMAL LOW (ref 30.0–36.0)
MCV: 88.7 fL (ref 80.0–100.0)
Platelets: 379 10*3/uL (ref 150–400)
RBC: 2.84 MIL/uL — ABNORMAL LOW (ref 4.22–5.81)
RDW: 16.8 % — ABNORMAL HIGH (ref 11.5–15.5)
WBC: 13 10*3/uL — ABNORMAL HIGH (ref 4.0–10.5)
nRBC: 0 % (ref 0.0–0.2)

## 2022-05-11 LAB — GLUCOSE, CAPILLARY
Glucose-Capillary: 133 mg/dL — ABNORMAL HIGH (ref 70–99)
Glucose-Capillary: 150 mg/dL — ABNORMAL HIGH (ref 70–99)
Glucose-Capillary: 136 mg/dL — ABNORMAL HIGH (ref 70–99)
Glucose-Capillary: 108 mg/dL — ABNORMAL HIGH (ref 70–99)

## 2022-05-11 LAB — COMPREHENSIVE METABOLIC PANEL
ALT: 11 U/L (ref 0–44)
AST: 28 U/L (ref 15–41)
Albumin: 3.3 g/dL — ABNORMAL LOW (ref 3.5–5.0)
Alkaline Phosphatase: 86 U/L (ref 38–126)
Anion gap: 18 — ABNORMAL HIGH (ref 5–15)
BUN: 47 mg/dL — ABNORMAL HIGH (ref 8–23)
CO2: 22 mmol/L (ref 22–32)
Calcium: 10 mg/dL (ref 8.9–10.3)
Chloride: 94 mmol/L — ABNORMAL LOW (ref 98–111)
Creatinine, Ser: 8.91 mg/dL — ABNORMAL HIGH (ref 0.61–1.24)
GFR, Estimated: 6 mL/min — ABNORMAL LOW (ref >60)
Glucose, Bld: 117 mg/dL — ABNORMAL HIGH (ref 70–99)
Potassium: 4 mmol/L (ref 3.5–5.1)
Sodium: 134 mmol/L — ABNORMAL LOW (ref 135–145)
Total Bilirubin: 0.6 mg/dL (ref 0.3–1.2)
Total Protein: 8.9 g/dL — ABNORMAL HIGH (ref 6.5–8.1)

## 2022-05-11 LAB — MAGNESIUM: Magnesium: 1.8 mg/dL (ref 1.7–2.4)

## 2022-05-11 MED ORDER — MIDODRINE HCL 5 MG PO TABS
30.0000 mg | ORAL_TABLET | Freq: Three times a day (TID) | ORAL | Status: DC
  Administered 2022-05-11 – 2022-05-13 (×8): 30 mg via ORAL
  Filled 2022-05-11 (×8): qty 6

## 2022-05-11 MED ORDER — CHLORHEXIDINE GLUCONATE CLOTH 2 % EX PADS
6.0000 | MEDICATED_PAD | Freq: Every day | CUTANEOUS | Status: DC
  Administered 2022-05-12 – 2022-05-13 (×2): 6 via TOPICAL

## 2022-05-11 NOTE — Progress Notes (Signed)
Occupational Therapy Treatment Patient Details Name: Mathew Lara MRN: [id number] DOB: 1960/12/20 Today's Date: 05/11/2022   History of present illness Pt is 61 yo male presenting to [facility] ED on 8/5 with abdominal pain. ED work-up revealed DKA, acute pancreatitis, AKI. CT 8/5 compatible with acute pancreatitis. Overnight 8/5 developed continuing respiratory failure and sock. Transfer to [facility]. Placed on BiPAP. Started on CRRT 04/13/22 - 04/25/22. Repeat CT abd pelvis 8/14 with edematous pancreas but no evidence of necrotizing pancreatitis. s/p TIJ TDC placed by IR on 8/29. PMH including cholecystectomy (March 23, 2021), alcohol abuse, anxiety, CKD, COPD, diabetes mellitus type 2, essential hypertension, hepatitis C, mixed hyperlipidemia.   OT comments  Mathew Lara is making excellent progress toward his OT goals and has met all previously set goals, therefore goals were upgraded to (S) level overall. Session focused on dynamic balance challenges, functional activity tolerance, and postural stability. He required min guard throughout session to complete blocked practice sit <> stands, reciprocal tapping onto a 6 in step without UE support. Statically he was able to maintain standing balance at a min guard level but required min A as he fatigued and with higher level challenges. Skilled monitoring of vitals throughout session to ensure hemodynamic and cardiorespiratory stability. He required frequent rest breaks 2/2 tachycardia and O2 desaturations. Continue OT POC. SNF remains appropriate d/c for short term rehab.    Recommendations for follow up therapy are one component of a multi-disciplinary discharge planning process, led by the attending physician.  Recommendations may be updated based on patient status, additional functional criteria and insurance authorization.    Follow Up Recommendations  Skilled nursing-short term rehab (<3 hours/day)    Assistance Recommended at Discharge Frequent or  constant Supervision/Assistance  Patient can return home with the following  A lot of help with walking and/or transfers;A lot of help with bathing/dressing/bathroom;Direct supervision/assist for medications management;Direct supervision/assist for financial management;Assist for transportation;Help with stairs or ramp for entrance;Assistance with cooking/housework   Equipment Recommendations  Other (comment) (defer to SNF)    Recommendations for Other Services PT consult    Precautions / Restrictions Precautions Precautions: Fall Restrictions Weight Bearing Restrictions: No       Mobility Bed Mobility Overal bed mobility: Needs Assistance Bed Mobility: Supine to Sit, Sit to Supine Rolling: Supervision Sidelying to sit: Supervision Supine to sit: Supervision Sit to supine: Supervision Sit to sidelying: Supervision      Transfers Overall transfer level: Needs assistance Equipment used: None Transfers: Sit to/from Stand Sit to Stand: Min guard Stand pivot transfers: Min assist   Step pivot transfers: Min assist     General transfer comment: Cueing for upright posture and trunk alignment     Balance Overall balance assessment: Needs assistance Sitting-balance support: No upper extremity supported, Feet supported Sitting balance-Leahy Scale: Good     Standing balance support: No upper extremity supported Standing balance-Leahy Scale: Fair Standing balance comment: No UE support and pt able to engage in dynamic reciprocal stepping with min guard, occasional min A but no LOB               High Level Balance Comments: Reciprocal stepping, blocked practice sit <> stand without UE support           ADL either performed or assessed with clinical judgement    Extremity/Trunk Assessment Upper Extremity Assessment Upper Extremity Assessment: Generalized weakness   Lower Extremity Assessment Lower Extremity Assessment: Generalized weakness        Vision  Vision Assessment?: No apparent visual deficits          Cognition Arousal/Alertness: Awake/alert Behavior During Therapy: WFL for tasks assessed/performed Overall Cognitive Status: Impaired/Different from baseline Area of Impairment: Awareness, Safety/judgement                   Current Attention Level: Selective   Following Commands: Follows multi-step commands with increased time Safety/Judgement: Decreased awareness of safety, Decreased awareness of deficits Awareness: Emergent Problem Solving: Requires verbal cues                General Comments Pt on 4L O2 via [place]. He fluctuated in SpO2, dropping to high 80's and requiring cueing for breathing techniques and pursed lip breathing. Pt also tachy, 120-130 bpm with activity and at rest, requiring extended rest breaks    Pertinent Vitals/ Pain       Pain Assessment Pain Assessment: No/denies pain         Frequency  Min 2X/week        Progress Toward Goals  OT Goals(current goals can now be found in the care plan section)  Progress towards OT goals: Progressing toward goals  Acute Rehab OT Goals Patient Stated Goal: Get stronger to go home OT Goal Formulation: With patient Time For Goal Achievement: 05/11/22 Potential to Achieve Goals: Good ADL Goals Pt Will Perform Grooming: with supervision Pt Will Perform Upper Body Dressing: with supervision Pt Will Transfer to Toilet: with supervision Additional ADL Goal #1: pt will demo improved activity tolerance by standing for one grooming task  Plan Discharge plan remains appropriate;Frequency remains appropriate    Co-evaluation                 AM-PAC OT "6 Clicks" Daily Activity     Outcome Measure   Help from another person eating meals?: None Help from another person taking care of personal grooming?: A Little Help from another person toileting, which includes using toliet, bedpan, or urinal?: A Little Help from another person bathing  (including washing, rinsing, drying)?: A Little Help from another person to put on and taking off regular upper body clothing?: A Little Help from another person to put on and taking off regular lower body clothing?: A Lot 6 Click Score: 18    End of Session Equipment Utilized During Treatment: Oxygen (3-4 L)  OT Visit Diagnosis: Unsteadiness on feet (R26.81);Other abnormalities of gait and mobility (R26.89);Muscle weakness (generalized) (M62.81);Pain   Activity Tolerance Patient tolerated treatment well   Patient Left in bed;with call bell/phone within reach;with bed alarm set   Nurse Communication Mobility status        Time: 1349-1420 OT Time Calculation (min): 31 min  Charges: OT General Charges $OT Visit: 1 Visit OT Treatments $Therapeutic Activity: 23-37 mins  Laverle Hobby, OTR/L, CBIS Acute Rehab Office: [place] 05/11/2022, 2:59 PM

## 2022-05-11 NOTE — Progress Notes (Signed)
Physical Therapy Treatment Patient Details Name: Mathew Lara MRN: [id number] DOB: [birth year] Today's Date: 05/11/2022   History of Present Illness Pt is 61 yo male presenting to [facility] ED on 8/5 with abdominal pain. ED work-up revealed DKA, acute pancreatitis, AKI. CT 8/5 compatible with acute pancreatitis. Overnight 8/5 developed continuing respiratory failure and sock. Transfer to [facility]. Placed on BiPAP. Started on CRRT 04/13/22 - 04/25/22. Repeat CT abd pelvis 8/14 with edematous pancreas but no evidence of necrotizing pancreatitis. s/p TIJ TDC placed by IR on 8/29. PMH including cholecystectomy (March 23, 2021), alcohol abuse, anxiety, CKD, COPD, diabetes mellitus type 2, essential hypertension, hepatitis C, mixed hyperlipidemia.    PT Comments    Pt received in supine, brother present and encouraging, pt A&O x4 with good participation. Emphasis on safe technique with transfers, use of RW and activity pacing given pt with mild impulsivity and eager to progress strength/endurance. Pt with good effort and able to progress to household distance gait trial with mostly min guard this date but scores 24/58 on "6-clicks" due to need for mod safety cues. Pt HR and SpO2 unstable but with seated break rapid improvement, may need to reposition sensor to ear next session to further assess O2 needs, pt DOE 2/4 and reports 5/10 modified RPE (fatigue) at end of session. Pt continues to benefit from PT services to progress toward functional mobility goals.   Recommendations for follow up therapy are one component of a multi-disciplinary discharge planning process, led by the attending physician.  Recommendations may be updated based on patient status, additional functional criteria and insurance authorization.  Follow Up Recommendations  Skilled nursing-short term rehab (<3 hours/day) Can patient physically be transported by private vehicle: Yes   Assistance Recommended at Discharge Frequent or  constant Supervision/Assistance  Patient can return home with the following Assist for transportation;Assistance with cooking/housework;Help with stairs or ramp for entrance;A lot of help with walking and/or transfers;A lot of help with bathing/dressing/bathroom   Equipment Recommendations  Other (comment) (TBD post-acute)    Recommendations for Other Services       Precautions / Restrictions Precautions Precautions: Fall Restrictions Weight Bearing Restrictions: No     Mobility  Bed Mobility Overal bed mobility: Needs Assistance Bed Mobility: Supine to Sit, Sit to Supine Rolling: Supervision Sidelying to sit: Supervision   Sit to supine: Supervision   General bed mobility comments: cues for log rolling for low back comfort given c/o pain but pt at times ignoring cues for technique. use of bed rails    Transfers Overall transfer level: Needs assistance Equipment used: None Transfers: Sit to/from Stand Sit to Stand: Min guard           General transfer comment: Cueing for upright posture, safer UE placement and activity pacing/line awareness    Ambulation/Gait Ambulation/Gait assistance: Min assist, Min guard Gait Distance (Feet): 70 Feet Assistive device: Rolling walker (2 wheels) Gait Pattern/deviations: Step-through pattern, Drifts right/left       General Gait Details: mildly unsteady, shorter step length, cues for posture and proximity to the RW as well as self-monitoring for fatigue and activity pacing, given elevated HR; SpO2 desat with exertion on 3L O2 [place] to low 70's with mild to moderate dyspnea, however likely due to poor signal from finger sensor, next session plan to place sensor on ear to reassess, SpO2 reading [facility] almost immediately once pt seated on 3L/min. decreased stability needing up to minA for dynamic standing tasks without RW when attempted  Balance Overall balance assessment: Needs assistance Sitting-balance support: No upper extremity  supported, Feet supported Sitting balance-Leahy Scale: Good     Standing balance support: During functional activity, Bilateral upper extremity supported Standing balance-Leahy Scale: Fair Standing balance comment: reliant on RW for safety and pain relief given low back pain, fair with RW and poor with no AD for dynamic tasks                            Cognition Arousal/Alertness: Awake/alert Behavior During Therapy: WFL for tasks assessed/performed, Impulsive Overall Cognitive Status: Impaired/Different from baseline Area of Impairment: Awareness, Safety/judgement                   Current Attention Level: Selective   Following Commands: Follows multi-step commands with increased time, Follows one step commands consistently Safety/Judgement: Decreased awareness of safety, Decreased awareness of deficits Awareness: Emergent Problem Solving: Requires verbal cues General Comments: Pt eager to progress mobility but at times needs reminders for activity pacing and decreased carryover of cues for safe UE placement with transfers. Decreased insight into deficits/fatigue level but pt with fair insight into need for RW and O2 with mobility.        Exercises General Exercises - Lower Extremity Ankle Circles/Pumps: AROM, Both, 10 reps, Supine Other Exercises Other Exercises: hip/knee flexion/ext ROM reviewed technique/frequency seated in chair and in supine, pt reports continued compliance    General Comments General comments (skin integrity, edema, etc.): HR to 127 bpm with exertion, SpO2 WFL on 3L resting, desat to low 70's with noisy signal when gripping RW, however once seated SpO2 reading WFL on 3L within 10 seconds so likely due to sensor placement; plan to assess on ear next session to compare      Pertinent Vitals/Pain Pain Assessment Pain Assessment: Faces Faces Pain Scale: Hurts little more Pain Location: low back pain Pain Descriptors / Indicators:  Discomfort, Grimacing Pain Intervention(s): Monitored during session, Repositioned, Other (comment) (reviewed supine/sidelying pillow placement for improved low back pain, RN notified he may want ice pack later in day if pain is still moderate to high)           PT Goals (current goals can now be found in the care plan section) Acute Rehab PT Goals PT Goal Formulation: With patient Time For Goal Achievement: 05/11/22 Progress towards PT goals: Progressing toward goals    Frequency    Min 3X/week      PT Plan Current plan remains appropriate       AM-PAC PT "6 Clicks" Mobility   Outcome Measure  Help needed turning from your back to your side while in a flat bed without using bedrails?: A Little Help needed moving from lying on your back to sitting on the side of a flat bed without using bedrails?: A Little Help needed moving to and from a bed to a chair (including a wheelchair)?: A Little Help needed standing up from a chair using your arms (e.g., wheelchair or bedside chair)?: A Lot (mod safety cues) Help needed to walk in hospital room?: A Lot (mod safety cues) Help needed climbing 3-5 steps with a railing? : A Lot (mod safety cues) 6 Click Score: 15    End of Session Equipment Utilized During Treatment: Gait belt;Oxygen Activity Tolerance: Patient tolerated treatment well Patient left: in bed;with call bell/phone within reach;with family/visitor present (semi-sidelying with pillow bwn knees and behind his back) Nurse Communication: Mobility status PT Visit  Diagnosis: Other abnormalities of gait and mobility (R26.89);Muscle weakness (generalized) (M62.81)     Time: 4680-3212 PT Time Calculation (min) (ACUTE ONLY): 21 min  Charges:  $Gait Training: 8-22 mins                     Rusell Meneely P., PTA Acute Rehabilitation Services Secure Chat Preferred 9a-5:30pm Office: [place] 05/11/2022, 5:39 PM

## 2022-05-11 NOTE — Progress Notes (Signed)
New Dialysis Start   Patient identified as new dialysis start. Kidney Education packet assembled and given. Discussed the following items with patient:    Current medications and possible changes once started:  Discussed that patient's medications may change over time.  Ex; hypertension medications and diabetes medication.  Nephrologists will adjust as needed.  Fluid restrictions reviewed:  32 oz daily goal:  All liquids count; soups, ice, jello   Phosphorus and potassium: Handout given showing high potassium and phosphorus foods.  Alternative food and drink options given.  Family support:  no family present at bedside  Outpatient Clinic Resources:  Discussed roles of Outpatient clinic  staff and advised to make a list of needs, if any, to talk with outpatient staff if needed  Care plan schedule: Informed patient and family member of Care Plans in outpatient setting and to participate in the care plan.  An invitation would be given from outpatient clinic.   Dialysis Access Options:  Reviewed access options with patients. Discussed in detail about care at home with new AVG & AVF. Reviewed checking bruit and thrill. If dialysis catheter present, educated that patient could not take showers.  Catheter dressing changes were to be done by outpatient clinic staff only  Home therapy options:  Educated patient about home therapy options:  PD vs home hemo.  Patient is currently being admitted to a SNF and will AKI.   Patient verbalized understanding. Will continue to round on patient during admission.    Lilia Argue, RN

## 2022-05-11 NOTE — Progress Notes (Signed)
Requested to see pt for out-pt HD needs at d/c. Met with pt at bedside and spoke to pt's daughter, [name], via phone per pt request. Introduced self and explained role. [name] daughter prefers [facility]. Referral submitted to [facility] admissions for review. Pt will be going to snf at d/c. Will assist as needed.   Melven Sartorius Renal Navigator [phone]

## 2022-05-11 NOTE — Progress Notes (Signed)
Patient complains of not being able to rest due to alarms. MD gave order to only monitor continuous pulse ox at bedtime, but not during the day.

## 2022-05-11 NOTE — TOC Progression Note (Signed)
Transition of Care [facility]) - Progression Note    Patient Details  Name: Mathew Lara MRN: [id number] Date of Birth: 29-Sep-1960  Transition of Care [facility]) CM/SW [place], LCSW Phone Number: 05/11/2022, 9:51 AM  Clinical Narrative:    CSW confirmed bed offer with [facility]. CSW working with Renal Navigator to obtain outpatient HD chair in [place].    Expected Discharge Plan: Skilled Nursing Facility Barriers to Discharge: Ship broker, Continued Medical Work up, Other (must enter comment) (CLIPP to HD)  Expected Discharge Plan and Services Expected Discharge Plan: [place] In-house Referral: Clinical Social Work   Post Acute Care Choice: [place]                                         Social Determinants of Health (SDOH) Interventions    Readmission Risk Interventions     No data to display

## 2022-05-11 NOTE — Progress Notes (Signed)
PROGRESS NOTE   Mathew Lara  OJJ:[id number] DOB: [birth year] DOA: 04/10/2022 PCP: [name], MD  Brief Narrative:  61 year old black male known history of EtOH hep C HLD DM TY 2 HTN COPD Initial presentation [facility] 04/10/2022 epigastric pain radiating down lower abdomen cramping in nature associated with nausea--findings consistent with acute pancreatitis when admitted ./Creatinine was 41/3.0-18 8/6 overnight developed respiratory distress as well as shock placed on Levophed DKA was being treated 8/7 On pressors  8/8 BiPAP, worsening renal parameters 8/9 Tolerating CRRT, on BiPAP 8/10 has been off BiPAP, on high flow oxygen and tolerating 8/11 UOP improving. CRRT with net even UF 8/14 CT reveals worsening cavitary pneumonia and acute pancreatitis, large BM 8/15 abx changed for cavitary pneumonia 8/16 ID consult.  ID stopped vancomycin, Zosyn, azithromycin >narrowed to augmentin. CRRT stopped. 8/17 CRRT restarted.  Off pressors but placed on very high-dose midodrine 8/19 Remains off pressors, CRRT ongoing. HD cath required tPA.  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 -->IHD MWF (now anuric) since 8/21 midodrine high doses of 30 mg 3 times daily-Jardiance losartan Kerendia discontinued this admission IJ [facility] by IR on 9/37-- complications with cannulation on 8/30 resolved Binder as per renal Acidosis now ok-- to be managed by dialysis   multilobular pneumonia with acute respiratory failure 8/14 Underlying COPD OSA--- requiring 2 - 3 L of oxygen at baseline Encouraged compliance with  CPAP 3 weeks total Augmentin 250 ending 05/09/2022 to be repeated on-CT chest to be repeated 9/10 -daily weaning trials  Continue albuterol every 4 as needed 15 twice daily Yupelri 175 daily saline nasal spray White count remains elevated episodic SOB DOE 9/2 PM when off of oxygen-chest x-ray unchanged from prior  Acute pancreatitis on admission DKA on Admission and underlying DM TY 2 Pulled out core track 8/30-change all meds to PO  Levemir 45 units-->32 U, and sliding scale--- scheduled insulin held CBGs 100-150  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 Monday, Rena-Vite--transfused X1 on 8/20  Underlying EtOH, hep C (per patient treated) Apprently underwent rx for hep C  probable ICU delirium  Now resolved-cutting back Seroquel from 50-->25 discontinue gabapentin discontinue opiates  Severe protein energy malnutrition Educated about eating drinking and mobility-discontinue calorie count seems to be tolerating 90% of meals  DVT prophylaxis: SCD Code Status: Full Family Communication:  Disposition:  Status is: Inpatient Remains inpatient appropriate because:   Await skilled as well as clip and eventual placement   Consultants:  None  Procedures: No  Antimicrobials: Augmentin  Subjective:  well coherent-working hard with Pt today.  Feels less winded.  No issues otherwise Eating drinking without issue--counseled to not drink dark cola drinks Passing some urine still  Objective: Vitals:   05/11/22 0849 05/11/22 0852 05/11/22 1024 05/11/22 1200  BP:   (!) 88/57 101/70  Pulse: (!) 123  (!) 111 (!) 117  Resp: '18  20 19  '$ Temp:   97.9 F (36.6 C) 97.8 F (36.6 C)  TempSrc:   Oral  SpO2: 95% 95% 100% 99%  Weight:      Height:       No intake or output data in the 24 hours ending 05/11/22 1609  Filed Weights   05/07/22 2012 05/10/22 0820 05/10/22 1243  Weight:  121.8 kg 117.4 kg 115.6 kg    Examination:  EOMI NCAT no focal deficit CTA B no wheeze rales rhonchi ROM intact Abdomen obese nontender no rebound Getting HD and fistula in the left arm No lower extremity edema Neurologically intact  Data Reviewed: personally reviewed   CBC    Component Value Date/Time   WBC 13.0 (H) 05/11/2022 0255   RBC 2.84 (L) 05/11/2022 0255   HGB 7.5 (L) 05/11/2022 0255   HGB 14.0 03/16/2022 1050   HCT 25.2 (L) 05/11/2022 0255   HCT 42.4 03/16/2022 1050   PLT 379 05/11/2022 0255   PLT 258 03/16/2022 1050   MCV 88.7 05/11/2022 0255   MCV 80 03/16/2022 1050   MCH 26.4 05/11/2022 0255   MCHC 29.8 (L) 05/11/2022 0255   RDW 16.8 (H) 05/11/2022 0255   RDW 16.5 (H) 03/16/2022 1050   LYMPHSABS 1.5 05/08/2022 0618   LYMPHSABS 1.0 03/16/2022 1050   MONOABS 1.1 (H) 05/08/2022 0618   EOSABS 0.8 (H) 05/08/2022 0618   EOSABS 0.4 03/16/2022 1050   BASOSABS 0.1 05/08/2022 0618   BASOSABS 0.0 03/16/2022 1050      Latest Ref Rng & Units 05/11/2022    2:55 AM 05/10/2022    3:15 AM 05/09/2022   12:22 AM  CMP  Glucose 70 - 99 mg/dL 117  115  126   BUN 8 - 23 mg/dL 47  70  54   Creatinine 0.61 - 1.24 mg/dL 8.91  11.70  9.49   Sodium 135 - 145 mmol/L 134  137  137   Potassium 3.5 - 5.1 mmol/L 4.0  4.4  4.1   Chloride 98 - 111 mmol/L 94  95  96   CO2 22 - 32 mmol/L '22  22  23   '$ Calcium 8.9 - 10.3 mg/dL 10.0  10.3  10.5   Total Protein 6.5 - 8.1 g/dL 8.9  9.0    Total Bilirubin 0.3 - 1.2 mg/dL 0.6  0.5    Alkaline Phos 38 - 126 U/L 86  80    AST 15 - 41 U/L 28  31    ALT 0 - 44 U/L 11  13       Radiology Studies: No results found.   Scheduled Meds:  sodium chloride   Intravenous Once   arformoterol  15 mcg Nebulization BID   atorvastatin  80 mg Oral Daily   Chlorhexidine Gluconate Cloth  6 each Topical Daily   Chlorhexidine Gluconate Cloth  6 each Topical Q0600   Chlorhexidine Gluconate Cloth  6 each Topical Q0600   darbepoetin (ARANESP) injection -  DIALYSIS  100 mcg Intravenous Q Mon-HD   feeding supplement (NEPRO CARB STEADY)  237 mL Oral TID BM   feeding supplement (PROSource TF20)  70 mL Per Tube Daily   heparin  5,000 Units Subcutaneous Q8H   insulin aspart  1-3 Units Subcutaneous TID WC   insulin detemir  32 Units Subcutaneous Q12H   midodrine  30 mg Oral Q8H   multivitamin  1 tablet Oral QHS   mouth rinse  15 mL Mouth Rinse 4 times per day   pantoprazole  40 mg Oral QHS   QUEtiapine  25 mg Oral QHS   revefenacin  175 mcg Nebulization Daily   sevelamer carbonate  2,400 mg Oral TID WC   simethicone  80 mg Oral QID   sodium chloride flush  10-40 mL Intracatheter Q12H   Continuous Infusions:  sodium chloride Stopped (05/02/22 1645)   sodium chloride     feeding supplement (VITAL 1.5 CAL) Stopped (05/06/22 2200)     LOS: 31 days   Time spent: 17  Nita Sells, MD [facility] To contact the attending provider between 7A-7P or the covering provider during after hours 7P-7A, please log into the web site [email or web address] and access using universal [place] password for that web site. If you do not have the password, please call the hospital operator.  05/11/2022, 4:09 PM

## 2022-05-11 NOTE — Progress Notes (Signed)
[facility] Progress Note  Name: Mathew Lara MRN: [id number] DOB: 11/04/1960   Subjective:  Last HD on 9/4 with 1.5 kg UF.   He had no urine output charted over 9/4 (there is no entry).  Trying to get comfortable in the bed.  Spoke with HD SW this AM to start CLIP process for AKI.   He is sleepy this afternoon and doesn't offer much additional history.   Review of systems:    Denies n/v Denies shortness of breath Denies chest pain   Intake/Output Summary (Last 24 hours) at 05/11/2022 1310 Last data filed at 05/10/2022 1554 Gross per 24 hour  Intake 120 ml  Output --  Net 120 ml    Vitals:  Vitals:   05/11/22 0849 05/11/22 0852 05/11/22 1024 05/11/22 1200  BP:   (!) 88/57 101/70  Pulse: (!) 123  (!) 111 (!) 117  Resp: '18  20 19  '$ Temp:   97.9 F (36.6 C) 97.8 F (36.6 C)  TempSrc:   Oral   SpO2: 95% 95% 100% 99%  Weight:      Height:         Physical Exam:    General adult male in bed in no acute distress HEENT normocephalic atraumatic extraocular movements intact sclera anicteric Neck supple trachea midline Lungs clear to auscultation bilaterally normal work of breathing at rest on 3 liters oxygen Heart S1S2 no rub Abdomen soft nontender nondistended Extremities no edema appreciated Psych normal mood and affect Access left IJ tunn catheter  Medications reviewed   Labs:     Latest Ref Rng & Units 05/11/2022    2:55 AM 05/10/2022    3:15 AM 05/09/2022   12:22 AM  BMP  Glucose 70 - 99 mg/dL 117  115  126   BUN 8 - 23 mg/dL 47  70  54   Creatinine 0.61 - 1.24 mg/dL 8.91  11.70  9.49   Sodium 135 - 145 mmol/L 134  137  137   Potassium 3.5 - 5.1 mmol/L 4.0  4.4  4.1   Chloride 98 - 111 mmol/L 94  95  96   CO2 22 - 32 mmol/L '22  22  23   '$ Calcium 8.9 - 10.3 mg/dL 10.0  10.3  10.5      Assessment/Plan:    # Anuric dialysis dependent AKI/CKD stage IIIb-IV - presumably ischemic ATN in setting of acute pancreatitis, hypotension, pressor support,  with concomitant ARB, SGLT-2 inhibitor and finerenone.  Initially non-oliguric but now oligo-anuric. Started on CRRT 04/13/22 - 04/25/22.  Started iHD 8/21.  s/p TIJ TDC placed by IR on 8/29. - Remain off of losartan, Jardiance, and Kerendia.   - HD on 9/6 per a MWF schedule for now  - Contacted HD SW to [facility] as AKI.    # Shock -on very high dose of midodrine.  Will reduce midodrine to 30 mg TID.     #Acute hypoxic respiratory failure - with underlying COPD and OSA.  Currently on oxygen via nasal cannula.  optimize volume with HD.   #Hep C with advanced liver fibrosis.   #Chronic diastolic CHF - optimize volume status with HD   #Anemia normocytic; transfuse prn.  Iron saturation low with high ferritin because of inflammatory state.  Continue aranesp - increased dose to 100 mcg every Monday      #Hyperphosphatemia: note increased sevelamer dose.  May need a stronger binder (renal to titrate binders).  On nepro and renal diet.  phos in am.    #Acute metabolic encephalopathy, possible component of delirium.   # Acute pancreatitis: resolved  Disposition - continue inpatient monitoring.  AKI on HD and needs to secure an outpatient HD unit before he is able to be discharged  Claudia Desanctis, MD 05/11/2022 1:28 PM

## 2022-05-11 NOTE — Progress Notes (Signed)
   05/11/22 0753  Assess: MEWS Score  Temp 97.8 F (36.6 C)  BP 100/73  MAP (mmHg) 83  Pulse Rate (!) 109  ECG Heart Rate (!) 109  Resp 18  Level of Consciousness Alert  SpO2 96 %  O2 Device Nasal Cannula  O2 Flow Rate (L/min) 3 L/min  Assess: MEWS Score  MEWS Temp 0  MEWS Systolic 1  MEWS Pulse 1  MEWS RR 0  MEWS LOC 0  MEWS Score 2  MEWS Score Color Yellow  Assess: if the MEWS score is Yellow or Red  Were vital signs taken at a resting state? Yes  Focused Assessment No change from prior assessment  Does the patient meet 2 or more of the SIRS criteria? Yes  Does the patient have a confirmed or suspected source of infection? Yes  Provider and Rapid Response Notified? Yes  MEWS guidelines implemented *See Row Information* Yes  Treat  Pain Scale 0-10  Pain Score 6  Pain Type Chronic pain  Pain Location Back  Pain Orientation Mid;Lower  Pain Descriptors / Indicators Aching;Discomfort  Pain Frequency Intermittent  Patients Stated Pain Goal 0  Pain Intervention(s) Repositioned  Take Vital Signs  Increase Vital Sign Frequency  Yellow: Q 2hr X 2 then Q 4hr X 2, if remains yellow, continue Q 4hrs  Escalate  MEWS: Escalate Yellow: discuss with charge nurse/RN and consider discussing with provider and RRT  Notify: Charge Nurse/RN  Name of Charge Nurse/RN Notified Desiray, RN  Date Charge Nurse/RN Notified 05/11/22  Time Charge Nurse/RN Notified 0753  Notify: Provider  Provider Name/Title Dr. Verlon Au  Date Provider Notified 05/11/22  Time Provider Notified [phone]  Method of Notification  (secure chat)  Notification Reason Other (Comment) (yellow mews)  Provider response No new orders  Date of Provider Response 05/11/22  Time of Provider Response 0755  Assess: SIRS CRITERIA  SIRS Temperature  0  SIRS Pulse 1  SIRS Respirations  0  SIRS WBC 1  SIRS Score Sum  2   Notified Dr. Verlon Au that patient's MEWS is yellow due to heart rate 109 and SBP 100. MD acknowledged and  stated that this is his baseline. No new orders given at this time.

## 2022-05-12 DIAGNOSIS — N186 End stage renal disease: Secondary | ICD-10-CM

## 2022-05-12 DIAGNOSIS — I959 Hypotension, unspecified: Secondary | ICD-10-CM

## 2022-05-12 LAB — COMPREHENSIVE METABOLIC PANEL
ALT: 11 U/L (ref 0–44)
AST: 29 U/L (ref 15–41)
Albumin: 3.3 g/dL — ABNORMAL LOW (ref 3.5–5.0)
Alkaline Phosphatase: 85 U/L (ref 38–126)
Anion gap: 20 — ABNORMAL HIGH (ref 5–15)
BUN: 59 mg/dL — ABNORMAL HIGH (ref 8–23)
CO2: 22 mmol/L (ref 22–32)
Calcium: 10.4 mg/dL — ABNORMAL HIGH (ref 8.9–10.3)
Chloride: 93 mmol/L — ABNORMAL LOW (ref 98–111)
Creatinine, Ser: 10.74 mg/dL — ABNORMAL HIGH (ref 0.61–1.24)
GFR, Estimated: 5 mL/min — ABNORMAL LOW (ref >60)
Glucose, Bld: 100 mg/dL — ABNORMAL HIGH (ref 70–99)
Potassium: 3.8 mmol/L (ref 3.5–5.1)
Sodium: 135 mmol/L (ref 135–145)
Total Bilirubin: 0.7 mg/dL (ref 0.3–1.2)
Total Protein: 9.1 g/dL — ABNORMAL HIGH (ref 6.5–8.1)

## 2022-05-12 LAB — CBC
HCT: 26.3 % — ABNORMAL LOW (ref 39.0–52.0)
Hemoglobin: 8.2 g/dL — ABNORMAL LOW (ref 13.0–17.0)
MCH: 26.9 pg (ref 26.0–34.0)
MCHC: 31.2 g/dL (ref 30.0–36.0)
MCV: 86.2 fL (ref 80.0–100.0)
Platelets: 352 10*3/uL (ref 150–400)
RBC: 3.05 MIL/uL — ABNORMAL LOW (ref 4.22–5.81)
RDW: 16.8 % — ABNORMAL HIGH (ref 11.5–15.5)
WBC: 11.9 10*3/uL — ABNORMAL HIGH (ref 4.0–10.5)
nRBC: 0 % (ref 0.0–0.2)

## 2022-05-12 LAB — MAGNESIUM: Magnesium: 1.9 mg/dL (ref 1.7–2.4)

## 2022-05-12 LAB — GLUCOSE, CAPILLARY
Glucose-Capillary: 131 mg/dL — ABNORMAL HIGH (ref 70–99)
Glucose-Capillary: 121 mg/dL — ABNORMAL HIGH (ref 70–99)
Glucose-Capillary: 111 mg/dL — ABNORMAL HIGH (ref 70–99)
Glucose-Capillary: 126 mg/dL — ABNORMAL HIGH (ref 70–99)

## 2022-05-12 LAB — TSH: TSH: 0.998 u[IU]/mL (ref 0.350–4.500)

## 2022-05-12 LAB — PHOSPHORUS: Phosphorus: 7.6 mg/dL — ABNORMAL HIGH (ref 2.5–4.6)

## 2022-05-12 MED ORDER — HEPARIN SODIUM (PORCINE) 1000 UNIT/ML DIALYSIS
1000.0000 [IU] | INTRAMUSCULAR | Status: DC | PRN
Start: 2022-05-12 — End: 2022-05-12

## 2022-05-12 MED ORDER — HYDROXYZINE HCL 10 MG PO TABS
10.0000 mg | ORAL_TABLET | Freq: Once | ORAL | Status: AC
Start: 2022-05-12 — End: 2022-05-12
  Administered 2022-05-12: 10 mg via ORAL
  Filled 2022-05-12: qty 1

## 2022-05-12 MED ORDER — ALTEPLASE 2 MG IJ SOLR
INTRAMUSCULAR | Status: AC
  Administered 2022-05-12: 2 mg
  Filled 2022-05-12: qty 2

## 2022-05-12 MED ORDER — ANTICOAGULANT SODIUM CITRATE 4% (200MG/5ML) IV SOLN
5.0000 mL | Status: DC | PRN
  Filled 2022-05-12: qty 5

## 2022-05-12 MED ORDER — ALTEPLASE 2 MG IJ SOLR
2.0000 mg | Freq: Once | INTRAMUSCULAR | Status: DC | PRN
  Filled 2022-05-12: qty 2

## 2022-05-12 NOTE — Progress Notes (Signed)
[facility] Progress Note  Name: Mathew Lara MRN: [id number] DOB: [birth year]   Subjective:  Last HD on 9/4 with 1.5 kg UF.   He had 0 mL urine output charted over 9/5.  Patient has been accepted to an outpatient HD unit [facility]) for MWF spot.    Review of systems:      Denies n/v Denies shortness of breath Denies chest pain   Intake/Output Summary (Last 24 hours) at 05/12/2022 1223 Last data filed at 05/11/2022 1700 Gross per 24 hour  Intake --  Output 0 ml  Net 0 ml    Vitals:  Vitals:   05/12/22 0754 05/12/22 0800 05/12/22 0908 05/12/22 1146  BP: 98/79 102/78  (!) 88/70  Pulse: (!) 104 (!) 105  (!) 105  Resp: '20 20  20  '$ Temp: 98.1 F (36.7 C) 98.3 F (36.8 C)  (!) 97.5 F (36.4 C)  TempSrc: Oral Oral  Oral  SpO2: 97% 98% 94% 97%  Weight:      Height:         Physical Exam:     General adult male in bed in no acute distress HEENT normocephalic atraumatic extraocular movements intact sclera anicteric Neck supple trachea midline Lungs clear to auscultation bilaterally normal work of breathing at rest on 2 liters oxygen Heart S1S2 no rub Abdomen soft nontender nondistended Extremities no edema appreciated Psych normal mood and affect Access left IJ tunn catheter  Medications reviewed   Labs:     Latest Ref Rng & Units 05/12/2022    3:08 AM 05/11/2022    2:55 AM 05/10/2022    3:15 AM  BMP  Glucose 70 - 99 mg/dL 100  117  115   BUN 8 - 23 mg/dL 59  47  70   Creatinine 0.61 - 1.24 mg/dL 10.74  8.91  11.70   Sodium 135 - 145 mmol/L 135  134  137   Potassium 3.5 - 5.1 mmol/L 3.8  4.0  4.4   Chloride 98 - 111 mmol/L 93  94  95   CO2 22 - 32 mmol/L '22  22  22   '$ Calcium 8.9 - 10.3 mg/dL 10.4  10.0  10.3      Assessment/Plan:    # Anuric dialysis dependent AKI/CKD stage IIIb-IV - presumably ischemic ATN in setting of acute pancreatitis, hypotension, pressor support, with concomitant ARB, SGLT-2 inhibitor and finerenone.  Initially  non-oliguric but now oligo-anuric. Started on CRRT 04/13/22 - 04/25/22.  Started iHD 8/21.  s/p TIJ TDC placed by IR on 8/29. ----------------------- - HD on 9/6 per a MWF schedule for now  - Remain off of losartan, Jardiance, and Kerendia   - Patient has been accepted to an outpatient HD unit [facility]) for MWF spot.  He is AKI  - Would defer AVF until pressures are optimized as I am concerned that he would clot off an access   # hypotension - on very high dose of midodrine.  Reduced midodrine to 30 mg TID.     #Acute hypoxic respiratory failure - with underlying COPD and OSA.  Currently on oxygen via nasal cannula.  optimize volume with HD.   #Hep C with advanced liver fibrosis.   #Chronic diastolic CHF - optimize volume status with HD   #Anemia normocytic; transfuse prn.  Iron saturation low with high ferritin because of inflammatory state.  Continue aranesp - increased dose to 100 mcg every Monday      #Hyperphosphatemia: note increased  sevelamer dose.  May need a stronger binder (renal to titrate binders).  On nepro and renal diet. phos in am.    #Acute metabolic encephalopathy, possible component of delirium.   # Acute pancreatitis: resolved  Disposition - per team plan is for discharge tomorrow   Claudia Desanctis, MD 05/12/2022 12:43 PM

## 2022-05-12 NOTE — Progress Notes (Signed)
Nutrition Brief Note   RD received a consult for diet education regarding new HD. Pt set up for outpatient HD. Per nephrology's note, possible discharge tomorrow. RD added Renal nutrition therapy handout to discharge instructions.   Pt eating 75-90% of meals (9/1-9/2 documentation). Not drinking Nepro shake; RD to discontinue. Cortrak removed 8/31.   Mathew Lara RD, LDN Clinical Dietitian See Shea Evans for contact information.

## 2022-05-12 NOTE — Progress Notes (Signed)
PROGRESS NOTE   Mathew Lara  VQX:[id number] DOB: [birth year] DOA: 04/10/2022 PCP: [name], MD  Brief Narrative:  61 year old black male known history of EtOH hep C HLD DM TY 2 HTN COPD Initial presentation [facility] 04/10/2022 epigastric pain radiating down lower abdomen cramping in nature associated with nausea--findings consistent with acute pancreatitis when admitted ./Creatinine was 41/3.0-18 8/6 overnight developed respiratory distress as well as shock placed on Levophed DKA was being treated 8/7 On pressors  8/8 BiPAP, worsening renal parameters 8/9 Tolerating CRRT, on BiPAP 8/10 has been off BiPAP, on high flow oxygen and tolerating 8/11 UOP improving. CRRT with net even UF 8/14 CT reveals worsening cavitary pneumonia and acute pancreatitis, large BM 8/15 abx changed for cavitary pneumonia 8/16 ID consult.  ID stopped vancomycin, Zosyn, azithromycin >narrowed to augmentin. CRRT stopped. 8/17 CRRT restarted.  Off pressors but placed on very high-dose midodrine 8/19 Remains off pressors, CRRT ongoing. HD cath required tPA.  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 -->IHD MWF (now anuric) since 8/21 midodrine high doses of 30 mg 3 times daily-Jardiance losartan Kerendia discontinued this admission IJ [facility] by IR on 0/03-- complications with cannulation on 8/30 resolved Binder as per renal Acidosis now ok-- to be managed by dialysis   multilobular pneumonia with acute respiratory failure 8/14 Underlying COPD OSA--- requiring 2 - 3 L of oxygen at baseline Encouraged  compliance with CPAP 3 weeks total Augmentin 250 ending 05/09/2022 to be repeated on-CT chest to be repeated 9/10 -daily weaning trials  Continue albuterol every 4 as needed 15 twice daily Yupelri 175 daily saline nasal spray White count remains elevated episodic SOB DOE 9/2 PM when off of oxygen-chest x-ray unchanged from prior  Acute pancreatitis on admission DKA on Admission and underlying DM TY 2 Pulled out core track 8/30-change all meds to PO  Levemir 45 units-->32 U, and sliding scale--- scheduled insulin held CBGs 100-150  Hypotension -On high-dose Midodrin, was decreased from 40> 30 mg oral 3 times daily  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 Monday, Rena-Vite--transfused X1 on 8/20  Underlying EtOH, hep C (per patient treated) Apprently underwent rx for hep C  probable ICU delirium  Now resolved-cutting back Seroquel from 50-->25 discontinue gabapentin discontinue opiates  Severe protein energy malnutrition Educated about eating drinking and mobility-discontinue calorie count seems to be tolerating 90% of meals  DVT prophylaxis: [place] Heparin Code Status: Full Family Communication:  Disposition:  Status is: Inpatient Remains inpatient appropriate because:   Await skilled as well as clip and eventual placement   Consultants:  Renal ID IR GI  Procedures: No  Antimicrobials: Augmentin  Subjective:  No significant events overnight, he denies any complaints.  Objective: Vitals:   05/12/22 0754 05/12/22 0800 05/12/22 0908 05/12/22 1146  BP: 98/79 102/78  (!) 88/70  Pulse: (!) 104 (!) 105  (!) 105  Resp: '20 20  20  '$ Temp: 98.1 F (36.7 C) 98.3 F (36.8 C)  (!) 97.5 F (36.4 C)  TempSrc:  Oral Oral  Oral  SpO2: 97% 98% 94% 97%  Weight:      Height:        Intake/Output Summary (Last 24 hours) at 05/12/2022 1441 Last data filed at 05/11/2022 1700 Gross per  24 hour  Intake --  Output 0 ml  Net 0 ml   Filed Weights   05/07/22 2012 05/10/22 0820 05/10/22 1243  Weight: 121.8 kg 117.4 kg 115.6 kg    Examination:  Awake Alert, Oriented X 3, No new F.N deficits, Normal affect Symmetrical Chest wall movement, Good air movement bilaterally, CTAB RRR,No Gallops,Rubs or new Murmurs, No Parasternal Heave +ve B.Sounds, Abd Soft, No tenderness, No rebound - guarding or rigidity. No Cyanosis, Clubbing or edema, No new Rash or bruise     Data Reviewed: personally reviewed   CBC    Component Value Date/Time   WBC 11.9 (H) 05/12/2022 0308   RBC 3.05 (L) 05/12/2022 0308   HGB 8.2 (L) 05/12/2022 0308   HGB 14.0 03/16/2022 1050   HCT 26.3 (L) 05/12/2022 0308   HCT 42.4 03/16/2022 1050   PLT 352 05/12/2022 0308   PLT 258 03/16/2022 1050   MCV 86.2 05/12/2022 0308   MCV 80 03/16/2022 1050   MCH 26.9 05/12/2022 0308   MCHC 31.2 05/12/2022 0308   RDW 16.8 (H) 05/12/2022 0308   RDW 16.5 (H) 03/16/2022 1050   LYMPHSABS 1.5 05/08/2022 0618   LYMPHSABS 1.0 03/16/2022 1050   MONOABS 1.1 (H) 05/08/2022 0618   EOSABS 0.8 (H) 05/08/2022 0618   EOSABS 0.4 03/16/2022 1050   BASOSABS 0.1 05/08/2022 0618   BASOSABS 0.0 03/16/2022 1050      Latest Ref Rng & Units 05/12/2022    3:08 AM 05/11/2022    2:55 AM 05/10/2022    3:15 AM  CMP  Glucose 70 - 99 mg/dL 100  117  115   BUN 8 - 23 mg/dL 59  47  70   Creatinine 0.61 - 1.24 mg/dL 10.74  8.91  11.70   Sodium 135 - 145 mmol/L 135  134  137   Potassium 3.5 - 5.1 mmol/L 3.8  4.0  4.4   Chloride 98 - 111 mmol/L 93  94  95   CO2 22 - 32 mmol/L '22  22  22   '$ Calcium 8.9 - 10.3 mg/dL 10.4  10.0  10.3   Total Protein 6.5 - 8.1 g/dL 9.1  8.9  9.0   Total Bilirubin 0.3 - 1.2 mg/dL 0.7  0.6  0.5   Alkaline Phos 38 - 126 U/L 85  86  80   AST 15 - 41 U/L '29  28  31   '$ ALT 0 - 44 U/L '11  11  13      '$ Radiology Studies: No results found.   Scheduled Meds:  sodium chloride   Intravenous Once   arformoterol   15 mcg Nebulization BID   atorvastatin  80 mg Oral Daily   Chlorhexidine Gluconate Cloth  6 each Topical Daily   Chlorhexidine Gluconate Cloth  6 each Topical Q0600   Chlorhexidine Gluconate Cloth  6 each Topical Q0600   Chlorhexidine Gluconate Cloth  6 each Topical Q0600   darbepoetin (ARANESP) injection - DIALYSIS  100 mcg Intravenous Q Mon-HD   heparin  5,000 Units Subcutaneous Q8H   insulin aspart  1-3 Units Subcutaneous TID WC   insulin detemir  32 Units Subcutaneous Q12H   midodrine  30 mg Oral Q8H   multivitamin  1  tablet Oral QHS   mouth rinse  15 mL Mouth Rinse 4 times per day   pantoprazole  40 mg Oral QHS   QUEtiapine  25 mg Oral QHS   revefenacin  175 mcg Nebulization Daily   sevelamer carbonate  2,400 mg Oral TID WC   simethicone  80 mg Oral QID   sodium chloride flush  10-40 mL Intracatheter Q12H   Continuous Infusions:  sodium chloride Stopped (05/02/22 1645)   sodium chloride       LOS: 32 days   Phillips Climes, MD [facility] To contact the attending provider between 7A-7P or the covering provider during after hours 7P-7A, please log into the web site [email or web address] and access using universal [place] password for that web site. If you do not have the password, please call the hospital operator.  05/12/2022, 2:41 PM

## 2022-05-12 NOTE — Progress Notes (Signed)
Pt has been accepted at [facility]) on MWF 11:30 chair time. Pt can start on Friday and will need to arrive at 10:45 to complete paperwork prior to treatment. Update provided to attending, nephrologist, [place], and pt's RN. Spoke to pt's daughter, Hortense Ramal phone to discuss above details. Daughter agreeable to plan. Out-pt HD arrangements added to AVS. Will contact clinic and renal PA once d/c date is confirmed.   Melven Sartorius Renal Navigator [phone]

## 2022-05-12 NOTE — Plan of Care (Signed)
Note today catheter did not work for HD.  Tpa was instilled and he will come back. Will try HD tonight staffing permitting and then if not will do HD tomorrow first shift  He is not able to be discharged until we are sure that his catheter is working well.    Claudia Desanctis, MD 3:40 PM 05/12/2022

## 2022-05-12 NOTE — TOC Progression Note (Addendum)
Transition of Care [facility]) - Progression Note    Patient Details  Name: DEARIUS HOFFMANN MRN: [id number] Date of Birth: 12-18-1960  Transition of Care [facility]) CM/SW Carmi, LCSW Phone Number: 05/12/2022, 2:17 PM  Clinical Narrative:    CSW initiated insurance authorization for [facility], [facility], [facility], Ref# [id number].   CSW left voicemail for patient's daughter.   Daughter returned call and CSW provided update on plan. Insurance approval still pending.    Expected Discharge Plan: Skilled Nursing Facility Barriers to Discharge: Continued Medical Work up, Orthoptist and Services Expected Discharge Plan: [place] In-house Referral: Clinical Social Work   Post Acute Care Choice: [place]                                         Social Determinants of Health (SDOH) Interventions    Readmission Risk Interventions     No data to display

## 2022-05-12 NOTE — Progress Notes (Signed)
PROGRESS NOTE   Mathew Lara  [id number]:[id number] DOB: [birth year] DOA: 04/10/2022 PCP: [name], MD  Brief Narrative:  61 year old black male known history of EtOH hep C HLD DM TY 2 HTN COPD Initial presentation [facility] 04/10/2022 epigastric pain radiating down lower abdomen cramping in nature associated with nausea--findings consistent with acute pancreatitis when admitted ./Creatinine was 41/3.0-18 8/6 overnight developed respiratory distress as well as shock placed on Levophed DKA was being treated 8/7 On pressors  8/8 BiPAP, worsening renal parameters 8/9 Tolerating CRRT, on BiPAP 8/10 has been off BiPAP, on high flow oxygen and tolerating 8/11 UOP improving. CRRT with net even UF 8/14 CT reveals worsening cavitary pneumonia and acute pancreatitis, large BM 8/15 abx changed for cavitary pneumonia 8/16 ID consult.  ID stopped vancomycin, Zosyn, azithromycin >narrowed to augmentin. CRRT stopped. 8/17 CRRT restarted.  Off pressors but placed on very high-dose midodrine 8/19 Remains off pressors, CRRT ongoing. HD cath required tPA.  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 -->IHD MWF (now anuric) since 8/21 midodrine high doses of 30 mg 3 times daily-Jardiance losartan Kerendia discontinued this admission IJ [facility] by IR on 3/01-- complications with cannulation on 8/30 resolved Binder as per renal Acidosis now ok-- to be managed by dialysis   multilobular pneumonia with acute respiratory failure 8/14 Underlying COPD OSA--- requiring 2 - 3 L of oxygen at baseline Encouraged  compliance with CPAP 3 weeks total Augmentin 250 ending 05/09/2022 to be repeated on-CT chest to be repeated 9/10 -daily weaning trials  Continue albuterol every 4 as needed 15 twice daily Yupelri 175 daily saline nasal spray White count remains elevated episodic SOB DOE 9/2 PM when off of oxygen-chest x-ray unchanged from prior  Acute pancreatitis on admission DKA on Admission and underlying DM TY 2 Pulled out core track 8/30-change all meds to PO  Levemir 45 units-->32 U, and sliding scale--- scheduled insulin held CBGs 100-150  Hypotension -On high-dose Midodrin, was decreased from 40> 30 mg oral 3 times daily -Cortisol within normal limit, will check TSH  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 Monday, Rena-Vite--transfused X1 on 8/20  Underlying EtOH, hep C (per patient treated) Apprently underwent rx for hep C  probable ICU delirium  Now resolved-cutting back Seroquel from 50-->25 discontinue gabapentin discontinue opiates  Severe protein energy malnutrition Educated about eating drinking and mobility-discontinue calorie count seems to be tolerating 90% of meals  DVT prophylaxis: [place] Heparin Code Status: Full Family Communication:  Disposition:  Status is: Inpatient Remains inpatient appropriate because:   Await skilled as well as clip and eventual placement   Consultants:  Renal ID IR GI  Procedures: No  Antimicrobials: Augmentin  Subjective:  No significant events overnight, he denies any complaints.  Objective: Vitals:   05/12/22 0754 05/12/22 0800 05/12/22 0908 05/12/22 1146  BP: 98/79 102/78  (!) 88/70  Pulse: (!) 104 (!) 105  (!) 105  Resp: '20 20  20  '$ Temp: 98.1 F (36.7 C) 98.3 F (36.8 C)  (!)  97.5 F (36.4 C)  TempSrc: Oral Oral  Oral  SpO2: 97% 98% 94% 97%  Weight:      Height:        Intake/Output Summary (Last 24 hours) at 05/12/2022  1446 Last data filed at 05/11/2022 1700 Gross per 24 hour  Intake --  Output 0 ml  Net 0 ml    Filed Weights   05/07/22 2012 05/10/22 0820 05/10/22 1243  Weight: 121.8 kg 117.4 kg 115.6 kg    Examination:  Awake Alert, Oriented X 3, No new F.N deficits, Normal affect Symmetrical Chest wall movement, Good air movement bilaterally, CTAB RRR,No Gallops,Rubs or new Murmurs, No Parasternal Heave +ve B.Sounds, Abd Soft, No tenderness, No rebound - guarding or rigidity. No Cyanosis, Clubbing or edema, No new Rash or bruise     Data Reviewed: personally reviewed   CBC    Component Value Date/Time   WBC 11.9 (H) 05/12/2022 0308   RBC 3.05 (L) 05/12/2022 0308   HGB 8.2 (L) 05/12/2022 0308   HGB 14.0 03/16/2022 1050   HCT 26.3 (L) 05/12/2022 0308   HCT 42.4 03/16/2022 1050   PLT 352 05/12/2022 0308   PLT 258 03/16/2022 1050   MCV 86.2 05/12/2022 0308   MCV 80 03/16/2022 1050   MCH 26.9 05/12/2022 0308   MCHC 31.2 05/12/2022 0308   RDW 16.8 (H) 05/12/2022 0308   RDW 16.5 (H) 03/16/2022 1050   LYMPHSABS 1.5 05/08/2022 0618   LYMPHSABS 1.0 03/16/2022 1050   MONOABS 1.1 (H) 05/08/2022 0618   EOSABS 0.8 (H) 05/08/2022 0618   EOSABS 0.4 03/16/2022 1050   BASOSABS 0.1 05/08/2022 0618   BASOSABS 0.0 03/16/2022 1050      Latest Ref Rng & Units 05/12/2022    3:08 AM 05/11/2022    2:55 AM 05/10/2022    3:15 AM  CMP  Glucose 70 - 99 mg/dL 100  117  115   BUN 8 - 23 mg/dL 59  47  70   Creatinine 0.61 - 1.24 mg/dL 10.74  8.91  11.70   Sodium 135 - 145 mmol/L 135  134  137   Potassium 3.5 - 5.1 mmol/L 3.8  4.0  4.4   Chloride 98 - 111 mmol/L 93  94  95   CO2 22 - 32 mmol/L '22  22  22   '$ Calcium 8.9 - 10.3 mg/dL 10.4  10.0  10.3   Total Protein 6.5 - 8.1 g/dL 9.1  8.9  9.0   Total Bilirubin 0.3 - 1.2 mg/dL 0.7  0.6  0.5   Alkaline Phos 38 - 126 U/L 85  86  80   AST 15 - 41 U/L '29  28  31   '$ ALT 0 - 44 U/L '11  11  13      '$ Radiology Studies: No results found.   Scheduled Meds:   sodium chloride   Intravenous Once   arformoterol  15 mcg Nebulization BID   atorvastatin  80 mg Oral Daily   Chlorhexidine Gluconate Cloth  6 each Topical Daily   Chlorhexidine Gluconate Cloth  6 each Topical Q0600   Chlorhexidine Gluconate Cloth  6 each Topical Q0600   Chlorhexidine Gluconate Cloth  6 each Topical Q0600   darbepoetin (ARANESP) injection - DIALYSIS  100 mcg Intravenous Q Mon-HD   heparin  5,000 Units Subcutaneous Q8H   insulin aspart  1-3 Units Subcutaneous TID WC   insulin detemir  32 Units Subcutaneous Q12H   midodrine  30 mg  Oral Q8H   multivitamin  1 tablet Oral QHS   mouth rinse  15 mL Mouth Rinse 4 times per day   pantoprazole  40 mg Oral QHS   QUEtiapine  25 mg Oral QHS   revefenacin  175 mcg Nebulization Daily   sevelamer carbonate  2,400 mg Oral TID WC   simethicone  80 mg Oral QID   sodium chloride flush  10-40 mL Intracatheter Q12H   Continuous Infusions:  sodium chloride Stopped (05/02/22 1645)   sodium chloride       LOS: 32 days   Phillips Climes, MD [facility] To contact the attending provider between 7A-7P or the covering provider during after hours 7P-7A, please log into the web site [email or web address] and access using universal [place] password for that web site. If you do not have the password, please call the hospital operator.  05/12/2022, 2:46 PM

## 2022-05-13 ENCOUNTER — Inpatient Hospital Stay (HOSPITAL_COMMUNITY): Payer: Medicare Other

## 2022-05-13 ENCOUNTER — Ambulatory Visit: Payer: Medicare Other | Admitting: Pulmonary Disease

## 2022-05-13 HISTORY — PX: IR FLUORO GUIDE CV LINE LEFT: IMG2282

## 2022-05-13 LAB — GLUCOSE, CAPILLARY
Glucose-Capillary: 109 mg/dL — ABNORMAL HIGH (ref 70–99)
Glucose-Capillary: 97 mg/dL (ref 70–99)
Glucose-Capillary: 100 mg/dL — ABNORMAL HIGH (ref 70–99)
Glucose-Capillary: 95 mg/dL (ref 70–99)
Glucose-Capillary: 108 mg/dL — ABNORMAL HIGH (ref 70–99)
Glucose-Capillary: 141 mg/dL — ABNORMAL HIGH (ref 70–99)
Glucose-Capillary: 111 mg/dL — ABNORMAL HIGH (ref 70–99)
Glucose-Capillary: 94 mg/dL (ref 70–99)

## 2022-05-13 MED ORDER — INSULIN DETEMIR 100 UNIT/ML ~~LOC~~ SOLN
28.0000 [IU] | Freq: Two times a day (BID) | SUBCUTANEOUS | Status: DC
  Administered 2022-05-13 – 2022-05-14 (×2): 28 [IU] via SUBCUTANEOUS
  Filled 2022-05-13 (×3): qty 0.28

## 2022-05-13 MED ORDER — LIDOCAINE HCL 1 % IJ SOLN
INTRAMUSCULAR | Status: AC
  Filled 2022-05-13: qty 20

## 2022-05-13 MED ORDER — LIDOCAINE HCL 1 % IJ SOLN
INTRAMUSCULAR | Status: AC | PRN
  Administered 2022-05-13: 10 mL via INTRADERMAL

## 2022-05-13 MED ORDER — ALTEPLASE 2 MG IJ SOLR
INTRAMUSCULAR | Status: AC
  Administered 2022-05-13: 2 mg
  Filled 2022-05-13: qty 4

## 2022-05-13 MED ORDER — HEPARIN SODIUM (PORCINE) 1000 UNIT/ML IJ SOLN
INTRAMUSCULAR | Status: AC | PRN
  Administered 2022-05-13: 3800 [IU] via INTRAVENOUS

## 2022-05-13 MED ORDER — CHLORHEXIDINE GLUCONATE CLOTH 2 % EX PADS: 6.0000 | MEDICATED_PAD | Freq: Every day | CUTANEOUS | Status: DC

## 2022-05-13 MED ORDER — INSULIN ASPART 100 UNIT/ML IJ SOLN: 1.0000 [IU] | Freq: Three times a day (TID) | INTRAMUSCULAR | Status: DC

## 2022-05-13 MED ORDER — HEPARIN SODIUM (PORCINE) 1000 UNIT/ML IJ SOLN
INTRAMUSCULAR | Status: AC
  Filled 2022-05-13: qty 10

## 2022-05-13 NOTE — Procedures (Signed)
Interventional Radiology Procedure Note  Procedure: Exchange of a nonfunctioning left IJ approach HD cath.  New 23 cm tip to cuff catheter.    Tip is positioned at the superior cavoatrial junction and catheter is ready for immediate use.  Complications: None Recommendations:  - Ok to use - Do not submerge - Routine line care   Signed,  Dulcy Fanny. Earleen Newport, DO

## 2022-05-13 NOTE — Progress Notes (Signed)
PROGRESS NOTE   Mathew Lara  KZS:[id number] DOB: Oct 27, 1960 DOA: 04/10/2022 PCP: [name], MD  Brief Narrative:  61 year old black male known history of EtOH hep C HLD DM TY 2 HTN COPD Initial presentation [facility] 04/10/2022 epigastric pain radiating down lower abdomen cramping in nature associated with nausea--findings consistent with acute pancreatitis when admitted ./Creatinine was 41/3.0-18 8/6 overnight developed respiratory distress as well as shock placed on Levophed DKA was being treated 8/7 On pressors  8/8 BiPAP, worsening renal parameters 8/9 Tolerating CRRT, on BiPAP 8/10 has been off BiPAP, on high flow oxygen and tolerating 8/11 UOP improving. CRRT with net even UF 8/14 CT reveals worsening cavitary pneumonia and acute pancreatitis, large BM 8/15 abx changed for cavitary pneumonia 8/16 ID consult.  ID stopped vancomycin, Zosyn, azithromycin >narrowed to augmentin. CRRT stopped. 8/17 CRRT restarted.  Off pressors but placed on very high-dose midodrine 8/19 Remains off pressors, CRRT ongoing. HD cath required tPA.  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  multilobular pneumonia with acute respiratory failure 8/14 Underlying COPD OSA--- requiring 2 - 3 L of oxygen at baseline Encouraged compliance with CPAP 3 weeks total Augmentin 250 ending 05/09/2022 to be repeated on-CT chest to be repeated 9/10 -daily weaning trials  Continue albuterol every 4 as needed 15 twice daily Yupelri 175 daily saline nasal spray White count remains elevated episodic SOB DOE 9/2 PM when off of oxygen-chest x-ray unchanged from prior  Acute pancreatitis on admission DKA on Admission and underlying DM TY 2 Pulled out core track 8/30-change all meds to PO  Levemir 45 units-->32 U, and sliding scale--- scheduled insulin held CBGs 100-150  Hypotension -On high-dose Midodrin, was decreased from 40> 30 mg oral 3 times daily -Cortisol within normal limit, will check TSH  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 Monday, Rena-Vite--transfused X1 on 8/20  Underlying EtOH, hep C (per patient treated) Apprently underwent rx for hep C  probable ICU delirium  Now resolved-cutting back Seroquel from 50-->25 discontinue gabapentin discontinue opiates  Severe protein energy malnutrition Educated about eating drinking and mobility-discontinue calorie count seems to be tolerating 90% of meals  DVT prophylaxis: [place] Heparin Code Status: Full Family Communication:  Disposition:  Status is: Inpatient Remains inpatient appropriate because:   Await skilled as well as clip and eventual placement   Consultants:  Renal ID IR GI  Procedures: No  Antimicrobials: Augmentin  Subjective:  Patient denies any complaints today, he was unable to receive dialysis yesterday as his [facility] malfunctioned  Objective: Vitals:   05/13/22 0005 05/13/22 0300 05/13/22 0755  05/13/22 0843  BP: (!) 124/95   117/74  Pulse: (!) 125   100  Resp: (!) 25  18    Temp: 98.5 F (36.9 C)   98.7 F (37.1 C)  TempSrc: Oral   Oral  SpO2: 95%  98% (!) 73%  Weight:      Height:        Intake/Output Summary (Last 24 hours) at 05/13/2022 1147 Last data filed at 05/13/2022 0907 Gross per 24 hour  Intake 3 ml  Output 0 ml  Net 3 ml   Filed Weights   05/07/22 2012 05/10/22 0820 05/10/22 1243  Weight: 121.8 kg 117.4 kg 115.6 kg    Examination:  Awake Alert, Oriented X 3, No new F.N deficits, Normal affect Symmetrical Chest wall movement, Good air movement bilaterally, CTAB RRR,No Gallops,Rubs or new Murmurs, No Parasternal Heave +ve B.Sounds, Abd Soft, No tenderness, No rebound - guarding or rigidity. No Cyanosis, Clubbing or edema, No new Rash or bruise     Data Reviewed: personally reviewed   CBC    Component Value Date/Time   WBC 11.9 (H) 05/12/2022 0308   RBC 3.05 (L) 05/12/2022 0308   HGB 8.2 (L) 05/12/2022 0308   HGB 14.0 03/16/2022 1050   HCT 26.3 (L) 05/12/2022 0308   HCT 42.4 03/16/2022 1050   PLT 352 05/12/2022 0308   PLT 258 03/16/2022 1050   MCV 86.2 05/12/2022 0308   MCV 80 03/16/2022 1050   MCH 26.9 05/12/2022 0308   MCHC 31.2 05/12/2022 0308   RDW 16.8 (H) 05/12/2022 0308   RDW 16.5 (H) 03/16/2022 1050   LYMPHSABS 1.5 05/08/2022 0618   LYMPHSABS 1.0 03/16/2022 1050   MONOABS 1.1 (H) 05/08/2022 0618   EOSABS 0.8 (H) 05/08/2022 0618   EOSABS 0.4 03/16/2022 1050   BASOSABS 0.1 05/08/2022 0618   BASOSABS 0.0 03/16/2022 1050      Latest Ref Rng & Units 05/12/2022    3:08 AM 05/11/2022    2:55 AM 05/10/2022    3:15 AM  CMP  Glucose 70 - 99 mg/dL 100  117  115   BUN 8 - 23 mg/dL 59  47  70   Creatinine 0.61 - 1.24 mg/dL 10.74  8.91  11.70   Sodium 135 - 145 mmol/L 135  134  137   Potassium 3.5 - 5.1 mmol/L 3.8  4.0  4.4   Chloride 98 - 111 mmol/L 93  94  95   CO2 22 - 32 mmol/L '22  22  22   '$ Calcium 8.9 - 10.3 mg/dL 10.4  10.0  10.3   Total Protein 6.5 - 8.1 g/dL 9.1  8.9  9.0   Total Bilirubin 0.3 - 1.2 mg/dL  0.7  0.6  0.5   Alkaline Phos 38 - 126 U/L 85  86  80   AST 15 - 41 U/L '29  28  31   '$ ALT 0 - 44 U/L '11  11  13      '$ Radiology Studies: No results found.   Scheduled Meds:  sodium chloride   Intravenous Once   arformoterol  15 mcg Nebulization BID   atorvastatin  80 mg Oral Daily   Chlorhexidine Gluconate Cloth  6 each Topical Q0600   darbepoetin (ARANESP) injection - DIALYSIS  100 mcg Intravenous Q Mon-HD   heparin  5,000 Units Subcutaneous Q8H   insulin aspart  1-3 Units Subcutaneous TID WC   insulin detemir  32 Units Subcutaneous Q12H   midodrine  30 mg Oral Q8H   multivitamin  1 tablet Oral QHS   mouth rinse  15 mL Mouth Rinse 4 times per day   pantoprazole  40 mg Oral QHS   QUEtiapine  25 mg Oral QHS   revefenacin  175 mcg Nebulization Daily   sevelamer carbonate  2,400 mg Oral TID WC   simethicone  80 mg Oral QID   sodium chloride flush  10-40 mL Intracatheter Q12H   Continuous Infusions:  sodium chloride Stopped (05/02/22 1645)   sodium chloride       LOS: 33 days   Phillips Climes, MD [facility] To contact the attending provider between 7A-7P or the covering provider during after hours 7P-7A, please log into the web site [email or web address] and access using universal [place] password for that web site. If you do not have the password, please call the hospital operator.  05/13/2022, 11:47 AM

## 2022-05-13 NOTE — Progress Notes (Signed)
Physical Therapy Treatment Patient Details Name: Mathew Lara MRN: [id number] DOB: 1961-07-30 Today's Date: 05/13/2022   History of Present Illness Pt is 61 yo male presenting to [facility] ED on 8/5 with abdominal pain. ED work-up revealed DKA, acute pancreatitis, AKI. CT 8/5 compatible with acute pancreatitis. Overnight 8/5 developed continuing respiratory failure and sock. Transfer to [facility]. Placed on BiPAP. Started on CRRT 04/13/22 - 04/25/22. Repeat CT abd pelvis 8/14 with edematous pancreas but no evidence of necrotizing pancreatitis. s/p TIJ TDC placed by IR on 8/29. PMH including cholecystectomy (March 23, 2021), alcohol abuse, anxiety, CKD, COPD, diabetes mellitus type 2, essential hypertension, hepatitis C, mixed hyperlipidemia.    PT Comments    Pt tolerates treatment well, ambulating for limited household distances. Pt remains tachycardic with ambulation, reporting dizziness despite stable blood pressures. Pt demonstrates some reduced awareness of safety and his deficits, continuing to benefit from supervision for monitoring of symptoms and rest breaks. Pt will benefit from aggressive mobilization in an effort to reduce falls risk and restore his prior level of function. PT continues to recommend SNF placement at this time. PT goals and POC updated according to pt progress.  Recommendations for follow up therapy are one component of a multi-disciplinary discharge planning process, led by the attending physician.  Recommendations may be updated based on patient status, additional functional criteria and insurance authorization.  Follow Up Recommendations  Skilled nursing-short term rehab (<3 hours/day) Can patient physically be transported by private vehicle: Yes   Assistance Recommended at Discharge Frequent or constant Supervision/Assistance  Patient can return home with the following Assist for transportation;Assistance with cooking/housework;Help with stairs or ramp for  entrance;A lot of help with walking and/or transfers;A lot of help with bathing/dressing/bathroom   Equipment Recommendations  Rolling walker (2 wheels)    Recommendations for Other Services       Precautions / Restrictions Precautions Precautions: Fall Precaution Comments: watch BP Restrictions Weight Bearing Restrictions: No     Mobility  Bed Mobility                    Transfers Overall transfer level: Needs assistance Equipment used: Rolling walker (2 wheels) Transfers: Sit to/from Stand Sit to Stand: Min guard                Ambulation/Gait Ambulation/Gait assistance: Min guard Gait Distance (Feet): 70 Feet (additional trial of 15' limited by dizziness) Assistive device: Rolling walker (2 wheels) Gait Pattern/deviations: Step-through pattern Gait velocity: reduced Gait velocity interpretation: <1.31 ft/sec, indicative of household ambulator   General Gait Details: pt with slowed step-through gait   Stairs             Wheelchair Mobility    Modified Rankin (Stroke Patients Only)       Balance Overall balance assessment: Needs assistance Sitting-balance support: No upper extremity supported, Feet supported Sitting balance-Leahy Scale: Good     Standing balance support: Single extremity supported, Bilateral upper extremity supported, Reliant on assistive device for balance Standing balance-Leahy Scale: Poor                              Cognition Arousal/Alertness: Awake/alert Behavior During Therapy: WFL for tasks assessed/performed, Impulsive Overall Cognitive Status: Impaired/Different from baseline Area of Impairment: Awareness, Problem solving, Safety/judgement                         Safety/Judgement: Decreased  awareness of deficits Awareness: Emergent Problem Solving: Slow processing          Exercises      General Comments General comments (skin integrity, edema, etc.): tachycardia into 130s  with mobility, pt desats on room air with ambulation, sats stable on 2L [place]. Pt reports dizziness with initial bout of ambulation, BP stable 118/62      Pertinent Vitals/Pain Pain Assessment Pain Assessment: No/denies pain    Home Living                          Prior Function            PT Goals (current goals can now be found in the care plan section) Acute Rehab PT Goals Patient Stated Goal: to get out of the hospital PT Goal Formulation: With patient Time For Goal Achievement: 05/27/22 Potential to Achieve Goals: Fair Progress towards PT goals: Progressing toward goals    Frequency    Min 3X/week      PT Plan Current plan remains appropriate    Co-evaluation              AM-PAC PT "6 Clicks" Mobility   Outcome Measure  Help needed turning from your back to your side while in a flat bed without using bedrails?: A Little Help needed moving from lying on your back to sitting on the side of a flat bed without using bedrails?: A Little Help needed moving to and from a bed to a chair (including a wheelchair)?: A Little Help needed standing up from a chair using your arms (e.g., wheelchair or bedside chair)?: A Little Help needed to walk in hospital room?: A Little Help needed climbing 3-5 steps with a railing? : Total 6 Click Score: 16    End of Session Equipment Utilized During Treatment: Oxygen Activity Tolerance: Patient tolerated treatment well Patient left: in bed;with call bell/phone within reach;with bed alarm set Nurse Communication: Mobility status PT Visit Diagnosis: Other abnormalities of gait and mobility (R26.89);Muscle weakness (generalized) (M62.81)     Time: 1443-1540 PT Time Calculation (min) (ACUTE ONLY): 18 min  Charges:  $Gait Training: 8-22 mins                     Zenaida Niece, PT, DPT Acute Rehabilitation Office [name] 05/13/2022, 4:36 PM

## 2022-05-13 NOTE — TOC Progression Note (Signed)
Transition of Care [facility]) - Progression Note    Patient Details  Name: Mathew Lara MRN: [id number] Date of Birth: 17-Jun-1961  Transition of Care [facility]) CM/SW [place], LCSW Phone Number: 05/13/2022, 8:41 AM  Clinical Narrative:    Insurance approval received for [facility], Ref# [id number], Auth ID# [id number], effective 05/13/2022-05/17/2022. Will follow for medical readiness.    Expected Discharge Plan: Skilled Nursing Facility Barriers to Discharge: Continued Medical Work up, Orthoptist and Services Expected Discharge Plan: [place] In-house Referral: Clinical Social Work   Post Acute Care Choice: [place]                                         Social Determinants of Health (SDOH) Interventions    Readmission Risk Interventions     No data to display

## 2022-05-13 NOTE — Progress Notes (Signed)
Received patient in bed to unit.  Alert and oriented.  Informed consent signed and in chart.   Treatment initiated: 2256 Treatment completed: 0005  HD tx not achieved as expected due to catheter not working properly, it  run for one hour and then completely stopped working.  Cathflo 2 mg inserted. Patient not tolerated well  the first hour of the treatment, was hypotensive.  Transported back to the room  Alert, without acute distress.  Hand-off given to patient's nurse.   Access used: catheter Access issues: not working  Total UF removed: 0 Medication(s) given: none Post HD VS: 98.5 124/95 124, 25  Post HD weight: 115 kg   [facility]

## 2022-05-13 NOTE — Progress Notes (Signed)
[facility] Progress Note  Name: Mathew Lara MRN: [id number] DOB: [birth year]   Subjective:  Attempted to get HD yesterday but catheter would not work.  Access was tpa'd and he came back overnight.  Had 1 hour of HD and catheter did not work.  RN tpa'd again. He had no UOP charted over 9/6.  He and I discussed getting the catheter exchanged and he does consent to same.  He is more awake this am.  He wants to do whatever is needed to get better.   Review of systems:   Denies n/v Denies shortness of breath Denies chest pain   Intake/Output Summary (Last 24 hours) at 05/13/2022 1121 Last data filed at 05/13/2022 0907 Gross per 24 hour  Intake 3 ml  Output 0 ml  Net 3 ml    Vitals:  Vitals:   05/13/22 0005 05/13/22 0300 05/13/22 0755 05/13/22 0843  BP: (!) 124/95   117/74  Pulse: (!) 125   100  Resp: (!) 25 18    Temp: 98.5 F (36.9 C)   98.7 F (37.1 C)  TempSrc: Oral   Oral  SpO2: 95%  98% (!) 73%  Weight:      Height:         Physical Exam:     General adult male in bed in no acute distress HEENT normocephalic atraumatic extraocular movements intact sclera anicteric Neck supple trachea midline Lungs clear to auscultation bilaterally normal work of breathing at rest on 3 liters oxygen Heart S1S2 no rub Abdomen soft nontender nondistended Extremities no edema appreciated Psych normal mood and affect Access left IJ tunn catheter  Medications reviewed   Labs:     Latest Ref Rng & Units 05/12/2022    3:08 AM 05/11/2022    2:55 AM 05/10/2022    3:15 AM  BMP  Glucose 70 - 99 mg/dL 100  117  115   BUN 8 - 23 mg/dL 59  47  70   Creatinine 0.61 - 1.24 mg/dL 10.74  8.91  11.70   Sodium 135 - 145 mmol/L 135  134  137   Potassium 3.5 - 5.1 mmol/L 3.8  4.0  4.4   Chloride 98 - 111 mmol/L 93  94  95   CO2 22 - 32 mmol/L '22  22  22   '$ Calcium 8.9 - 10.3 mg/dL 10.4  10.0  10.3      Assessment/Plan:    # Anuric dialysis dependent AKI/CKD stage IIIb-IV -  presumably ischemic ATN in setting of acute pancreatitis, hypotension, pressor support, with concomitant ARB, SGLT-2 inhibitor and finerenone.  Initially non-oliguric but now oligo-anuric. Started on CRRT 04/13/22 - 04/25/22.  Started iHD 8/21.  s/p TIJ TDC placed by IR on 8/29. ----------------------- - HD per MWF schedule for now.  Next treatment tomorrow - Remain off of losartan, Jardiance, and Kerendia   - Consulted IR for tunneled dialysis catheter exchange   - When stable for discharge, patient has been accepted to an outpatient HD unit [facility]) for MWF spot.  He is AKI  - Would defer AVF until pressures are optimized as I am concerned that he would clot off an access given the hypotension - renal panel in AM   # hypotension - on very high dose of midodrine. midodrine is now at 30 mg TID.     #Acute hypoxic respiratory failure - with underlying COPD and OSA.  Currently on oxygen via nasal cannula.  optimize volume with HD.   #  Hep C with advanced liver fibrosis.   #Chronic diastolic CHF - optimize volume status with HD   #Anemia normocytic; transfuse prn.  Iron saturation low with high ferritin because of inflammatory state.  Continue aranesp - increased dose to 100 mcg every Monday      #Hyperphosphatemia: improved.  note increased sevelamer dose.  May need a stronger binder (renal to titrate binders).  On nepro and renal diet.   #Acute metabolic encephalopathy, possible component of delirium.   # Acute pancreatitis: resolved  Disposition - per team plan is for discharge tomorrow   Claudia Desanctis, MD 05/13/2022 11:42 AM

## 2022-05-13 NOTE — Progress Notes (Signed)
Occupational Therapy Treatment Patient Details Name: Mathew Lara MRN: [id number] DOB: 10/14/1960 Today's Date: 05/13/2022   History of present illness Pt is 61 yo male presenting to [facility] ED on 8/5 with abdominal pain. ED work-up revealed DKA, acute pancreatitis, AKI. CT 8/5 compatible with acute pancreatitis. Overnight 8/5 developed continuing respiratory failure and sock. Transfer to [facility]. Placed on BiPAP. Started on CRRT 04/13/22 - 04/25/22. Repeat CT abd pelvis 8/14 with edematous pancreas but no evidence of necrotizing pancreatitis. s/p TIJ TDC placed by IR on 8/29. PMH including cholecystectomy (March 23, 2021), alcohol abuse, anxiety, CKD, COPD, diabetes mellitus type 2, essential hypertension, hepatitis C, mixed hyperlipidemia.   OT comments  Pt wrapped up in lines and blankets with gown half off. Grateful for assistance. Stood at bedside and performed pericare with min guard assist, donned gown with min assist, transferred to chair for bed linen change and back to bed. Pt with symptomatic hypotension. 97/64 in chair, returned to supine with BP of 106/76 with resolving of dizziness. RN notified.    Recommendations for follow up therapy are one component of a multi-disciplinary discharge planning process, led by the attending physician.  Recommendations may be updated based on patient status, additional functional criteria and insurance authorization.    Follow Up Recommendations  Skilled nursing-short term rehab (<3 hours/day)    Assistance Recommended at Discharge Frequent or constant Supervision/Assistance  Patient can return home with the following  A little help with walking and/or transfers;A lot of help with bathing/dressing/bathroom;Assistance with cooking/housework;Direct supervision/assist for medications management;Direct supervision/assist for financial management;Assist for transportation;Help with stairs or ramp for entrance   Equipment Recommendations  Other  (comment) (defer to next venue)    Recommendations for Other Services      Precautions / Restrictions Precautions Precautions: Fall Precaution Comments: watch BP       Mobility Bed Mobility Overal bed mobility: Needs Assistance Bed Mobility: Rolling, Sidelying to Sit, Sit to Sidelying Rolling: Supervision Sidelying to sit: Supervision     Sit to sidelying: Supervision General bed mobility comments: cues for log roll to minimize back pain    Transfers Overall transfer level: Needs assistance Equipment used: Rolling walker (2 wheels) Transfers: Sit to/from Stand, Bed to chair/wheelchair/BSC Sit to Stand: Min guard     Step pivot transfers: Min guard     General transfer comment: cues for hand placement     Balance Overall balance assessment: Needs assistance   Sitting balance-Leahy Scale: Good     Standing balance support: During functional activity, Bilateral upper extremity supported Standing balance-Leahy Scale: Poor Standing balance comment: held onto walker with one hand during pericare                           ADL either performed or assessed with clinical judgement   ADL Overall ADL's : Needs assistance/impaired                 Upper Body Dressing : Minimal assistance;Sitting           Toileting- Clothing Manipulation and Hygiene: Min guard;Sit to/from stand              Extremity/Trunk Assessment              Vision       Perception     Praxis      Cognition Arousal/Alertness: Awake/alert Behavior During Therapy: WFL for tasks assessed/performed, Impulsive Overall Cognitive Status: Impaired/Different from baseline  Area of Impairment: Awareness, Safety/judgement, Attention                   Current Attention Level: Selective     Safety/Judgement: Decreased awareness of deficits, Decreased awareness of safety Awareness: Emergent Problem Solving: Requires verbal cues          Exercises       Shoulder Instructions       General Comments      Pertinent Vitals/ Pain       Pain Assessment Pain Assessment: Faces Faces Pain Scale: Hurts little more Pain Location: chronic low back pain Pain Descriptors / Indicators: Discomfort, Grimacing Pain Intervention(s): Monitored during session, Repositioned  Home Living                                          Prior Functioning/Environment              Frequency  Min 2X/week        Progress Toward Goals  OT Goals(current goals can now be found in the care plan section)  Progress towards OT goals: Progressing toward goals  Acute Rehab OT Goals OT Goal Formulation: With patient Time For Goal Achievement: 05/27/22 Potential to Achieve Goals: Good ADL Goals Pt Will Perform Grooming: with supervision Pt Will Perform Upper Body Dressing: with supervision Pt Will Transfer to Toilet: with supervision Additional ADL Goal #1: pt will demo improved activity tolerance by standing for one grooming task Additional ADL Goal #2: Pt will monitor for s/s of hypotension and initiate sitting, return to supine, informing staff.  Plan Discharge plan remains appropriate;Frequency remains appropriate    Co-evaluation                 AM-PAC OT "6 Clicks" Daily Activity     Outcome Measure   Help from another person eating meals?: None Help from another person taking care of personal grooming?: A Little Help from another person toileting, which includes using toliet, bedpan, or urinal?: A Little Help from another person bathing (including washing, rinsing, drying)?: A Little Help from another person to put on and taking off regular upper body clothing?: A Little Help from another person to put on and taking off regular lower body clothing?: A Lot 6 Click Score: 18    End of Session Equipment Utilized During Treatment: Rolling walker (2 wheels);Gait belt;Oxygen  OT Visit Diagnosis: Unsteadiness on feet  (R26.81);Other abnormalities of gait and mobility (R26.89);Muscle weakness (generalized) (M62.81);Pain   Activity Tolerance Treatment limited secondary to medical complications (Comment)   Patient Left in bed;with call bell/phone within reach;with bed alarm set   Nurse Communication          Time: 9211-9417 OT Time Calculation (min): 28 min  Charges: OT General Charges $OT Visit: 1 Visit OT Treatments $Self Care/Home Management : 23-37 mins  Cleta Alberts, OTR/L Acute Rehabilitation Services Office: [phone]   Malka So 05/13/2022, 10:31 AM

## 2022-05-13 NOTE — Progress Notes (Signed)
Inpatient Diabetes Program Recommendations  AACE/ADA: New Consensus Statement on Inpatient Glycemic Control (2015)  Target Ranges:  Prepandial:   less than 140 mg/dL      Peak postprandial:   less than 180 mg/dL (1-2 hours)      Critically ill patients:  140 - 180 mg/dL   Lab Results  Component Value Date   GLUCAP 95 05/13/2022   HGBA1C 12.5 (H) 04/10/2022    Review of Glycemic Control  Latest Reference Range & Units 05/13/22 03:09 05/13/22 08:42 05/13/22 11:00 05/13/22 11:28  Glucose-Capillary 70 - 99 mg/dL 109 (H) 97 100 (H) 95   Diabetes history: DM 2 Current orders for Inpatient glycemic control:  Novolog 1-3 units q q 4 hours Levemir 32 units bid  Inpatient Diabetes Program Recommendations:    Consider reducing Levemir to 28 units bid.   Thanks,  Adah Perl, RN, BC-ADM Inpatient Diabetes Coordinator Pager [phone]  (8a-5p)

## 2022-05-14 DIAGNOSIS — E8809 Other disorders of plasma-protein metabolism, not elsewhere classified: Secondary | ICD-10-CM | POA: Diagnosis not present

## 2022-05-14 DIAGNOSIS — K851 Biliary acute pancreatitis without necrosis or infection: Secondary | ICD-10-CM | POA: Diagnosis not present

## 2022-05-14 DIAGNOSIS — G9341 Metabolic encephalopathy: Secondary | ICD-10-CM | POA: Diagnosis not present

## 2022-05-14 DIAGNOSIS — R52 Pain, unspecified: Secondary | ICD-10-CM | POA: Diagnosis not present

## 2022-05-14 DIAGNOSIS — J449 Chronic obstructive pulmonary disease, unspecified: Secondary | ICD-10-CM | POA: Diagnosis not present

## 2022-05-14 DIAGNOSIS — E441 Mild protein-calorie malnutrition: Secondary | ICD-10-CM | POA: Diagnosis not present

## 2022-05-14 DIAGNOSIS — D689 Coagulation defect, unspecified: Secondary | ICD-10-CM | POA: Diagnosis not present

## 2022-05-14 DIAGNOSIS — Z7401 Bed confinement status: Secondary | ICD-10-CM | POA: Diagnosis not present

## 2022-05-14 DIAGNOSIS — G4733 Obstructive sleep apnea (adult) (pediatric): Secondary | ICD-10-CM | POA: Diagnosis not present

## 2022-05-14 DIAGNOSIS — I130 Hypertensive heart and chronic kidney disease with heart failure and stage 1 through stage 4 chronic kidney disease, or unspecified chronic kidney disease: Secondary | ICD-10-CM | POA: Diagnosis not present

## 2022-05-14 DIAGNOSIS — F1010 Alcohol abuse, uncomplicated: Secondary | ICD-10-CM | POA: Diagnosis present

## 2022-05-14 DIAGNOSIS — E878 Other disorders of electrolyte and fluid balance, not elsewhere classified: Secondary | ICD-10-CM | POA: Diagnosis not present

## 2022-05-14 DIAGNOSIS — E039 Hypothyroidism, unspecified: Secondary | ICD-10-CM | POA: Diagnosis not present

## 2022-05-14 DIAGNOSIS — M79675 Pain in left toe(s): Secondary | ICD-10-CM | POA: Diagnosis not present

## 2022-05-14 DIAGNOSIS — U071 COVID-19: Secondary | ICD-10-CM | POA: Diagnosis not present

## 2022-05-14 DIAGNOSIS — Z743 Need for continuous supervision: Secondary | ICD-10-CM | POA: Diagnosis not present

## 2022-05-14 DIAGNOSIS — Z992 Dependence on renal dialysis: Secondary | ICD-10-CM | POA: Diagnosis not present

## 2022-05-14 DIAGNOSIS — E119 Type 2 diabetes mellitus without complications: Secondary | ICD-10-CM | POA: Diagnosis not present

## 2022-05-14 DIAGNOSIS — N179 Acute kidney failure, unspecified: Secondary | ICD-10-CM | POA: Diagnosis not present

## 2022-05-14 DIAGNOSIS — E1110 Type 2 diabetes mellitus with ketoacidosis without coma: Secondary | ICD-10-CM | POA: Diagnosis not present

## 2022-05-14 DIAGNOSIS — I120 Hypertensive chronic kidney disease with stage 5 chronic kidney disease or end stage renal disease: Secondary | ICD-10-CM | POA: Diagnosis not present

## 2022-05-14 DIAGNOSIS — K852 Alcohol induced acute pancreatitis without necrosis or infection: Secondary | ICD-10-CM | POA: Diagnosis present

## 2022-05-14 DIAGNOSIS — M6281 Muscle weakness (generalized): Secondary | ICD-10-CM | POA: Diagnosis not present

## 2022-05-14 DIAGNOSIS — D638 Anemia in other chronic diseases classified elsewhere: Secondary | ICD-10-CM | POA: Diagnosis not present

## 2022-05-14 DIAGNOSIS — N2581 Secondary hyperparathyroidism of renal origin: Secondary | ICD-10-CM | POA: Diagnosis not present

## 2022-05-14 DIAGNOSIS — N170 Acute kidney failure with tubular necrosis: Secondary | ICD-10-CM | POA: Diagnosis not present

## 2022-05-14 DIAGNOSIS — Z6838 Body mass index (BMI) 38.0-38.9, adult: Secondary | ICD-10-CM | POA: Diagnosis not present

## 2022-05-14 DIAGNOSIS — K861 Other chronic pancreatitis: Secondary | ICD-10-CM | POA: Diagnosis not present

## 2022-05-14 DIAGNOSIS — J9601 Acute respiratory failure with hypoxia: Secondary | ICD-10-CM | POA: Diagnosis not present

## 2022-05-14 DIAGNOSIS — E1165 Type 2 diabetes mellitus with hyperglycemia: Secondary | ICD-10-CM | POA: Diagnosis not present

## 2022-05-14 DIAGNOSIS — R0902 Hypoxemia: Secondary | ICD-10-CM | POA: Diagnosis not present

## 2022-05-14 DIAGNOSIS — N186 End stage renal disease: Secondary | ICD-10-CM | POA: Diagnosis not present

## 2022-05-14 DIAGNOSIS — E871 Hypo-osmolality and hyponatremia: Secondary | ICD-10-CM | POA: Diagnosis not present

## 2022-05-14 DIAGNOSIS — R748 Abnormal levels of other serum enzymes: Secondary | ICD-10-CM | POA: Diagnosis not present

## 2022-05-14 DIAGNOSIS — K859 Acute pancreatitis without necrosis or infection, unspecified: Secondary | ICD-10-CM | POA: Diagnosis not present

## 2022-05-14 DIAGNOSIS — I5032 Chronic diastolic (congestive) heart failure: Secondary | ICD-10-CM | POA: Diagnosis not present

## 2022-05-14 DIAGNOSIS — K863 Pseudocyst of pancreas: Secondary | ICD-10-CM | POA: Diagnosis not present

## 2022-05-14 DIAGNOSIS — I132 Hypertensive heart and chronic kidney disease with heart failure and with stage 5 chronic kidney disease, or end stage renal disease: Secondary | ICD-10-CM | POA: Diagnosis not present

## 2022-05-14 DIAGNOSIS — D62 Acute posthemorrhagic anemia: Secondary | ICD-10-CM | POA: Diagnosis not present

## 2022-05-14 DIAGNOSIS — J9811 Atelectasis: Secondary | ICD-10-CM | POA: Diagnosis not present

## 2022-05-14 DIAGNOSIS — E1122 Type 2 diabetes mellitus with diabetic chronic kidney disease: Secondary | ICD-10-CM | POA: Diagnosis not present

## 2022-05-14 DIAGNOSIS — R2689 Other abnormalities of gait and mobility: Secondary | ICD-10-CM | POA: Diagnosis not present

## 2022-05-14 DIAGNOSIS — K5903 Drug induced constipation: Secondary | ICD-10-CM | POA: Diagnosis not present

## 2022-05-14 DIAGNOSIS — K5900 Constipation, unspecified: Secondary | ICD-10-CM | POA: Diagnosis not present

## 2022-05-14 DIAGNOSIS — E46 Unspecified protein-calorie malnutrition: Secondary | ICD-10-CM | POA: Diagnosis not present

## 2022-05-14 DIAGNOSIS — M79674 Pain in right toe(s): Secondary | ICD-10-CM | POA: Diagnosis not present

## 2022-05-14 DIAGNOSIS — N1832 Chronic kidney disease, stage 3b: Secondary | ICD-10-CM | POA: Diagnosis not present

## 2022-05-14 DIAGNOSIS — N178 Other acute kidney failure: Secondary | ICD-10-CM | POA: Diagnosis not present

## 2022-05-14 DIAGNOSIS — R262 Difficulty in walking, not elsewhere classified: Secondary | ICD-10-CM | POA: Diagnosis not present

## 2022-05-14 DIAGNOSIS — D649 Anemia, unspecified: Secondary | ICD-10-CM | POA: Diagnosis not present

## 2022-05-14 DIAGNOSIS — Z9049 Acquired absence of other specified parts of digestive tract: Secondary | ICD-10-CM | POA: Diagnosis not present

## 2022-05-14 DIAGNOSIS — Z23 Encounter for immunization: Secondary | ICD-10-CM | POA: Diagnosis not present

## 2022-05-14 DIAGNOSIS — K7402 Hepatic fibrosis, advanced fibrosis: Secondary | ICD-10-CM | POA: Diagnosis not present

## 2022-05-14 DIAGNOSIS — D72829 Elevated white blood cell count, unspecified: Secondary | ICD-10-CM | POA: Diagnosis not present

## 2022-05-14 DIAGNOSIS — R109 Unspecified abdominal pain: Secondary | ICD-10-CM | POA: Diagnosis not present

## 2022-05-14 DIAGNOSIS — B351 Tinea unguium: Secondary | ICD-10-CM | POA: Diagnosis not present

## 2022-05-14 DIAGNOSIS — D631 Anemia in chronic kidney disease: Secondary | ICD-10-CM | POA: Diagnosis not present

## 2022-05-14 DIAGNOSIS — I959 Hypotension, unspecified: Secondary | ICD-10-CM | POA: Diagnosis not present

## 2022-05-14 DIAGNOSIS — R6889 Other general symptoms and signs: Secondary | ICD-10-CM | POA: Diagnosis not present

## 2022-05-14 DIAGNOSIS — R579 Shock, unspecified: Secondary | ICD-10-CM | POA: Diagnosis not present

## 2022-05-14 DIAGNOSIS — R112 Nausea with vomiting, unspecified: Secondary | ICD-10-CM | POA: Diagnosis not present

## 2022-05-14 LAB — CBC
HCT: 27.2 % — ABNORMAL LOW (ref 39.0–52.0)
Hemoglobin: 8.3 g/dL — ABNORMAL LOW (ref 13.0–17.0)
MCH: 26.7 pg (ref 26.0–34.0)
MCHC: 30.5 g/dL (ref 30.0–36.0)
MCV: 87.5 fL (ref 80.0–100.0)
Platelets: 374 10*3/uL (ref 150–400)
RBC: 3.11 MIL/uL — ABNORMAL LOW (ref 4.22–5.81)
RDW: 17 % — ABNORMAL HIGH (ref 11.5–15.5)
WBC: 12.2 10*3/uL — ABNORMAL HIGH (ref 4.0–10.5)
nRBC: 0 % (ref 0.0–0.2)

## 2022-05-14 LAB — RENAL FUNCTION PANEL
Albumin: 3.3 g/dL — ABNORMAL LOW (ref 3.5–5.0)
Anion gap: 20 — ABNORMAL HIGH (ref 5–15)
BUN: 70 mg/dL — ABNORMAL HIGH (ref 8–23)
CO2: 23 mmol/L (ref 22–32)
Calcium: 10.4 mg/dL — ABNORMAL HIGH (ref 8.9–10.3)
Chloride: 92 mmol/L — ABNORMAL LOW (ref 98–111)
Creatinine, Ser: 12.55 mg/dL — ABNORMAL HIGH (ref 0.61–1.24)
GFR, Estimated: 4 mL/min — ABNORMAL LOW (ref >60)
Glucose, Bld: 91 mg/dL (ref 70–99)
Phosphorus: 8.6 mg/dL — ABNORMAL HIGH (ref 2.5–4.6)
Potassium: 4.5 mmol/L (ref 3.5–5.1)
Sodium: 135 mmol/L (ref 135–145)

## 2022-05-14 LAB — GLUCOSE, CAPILLARY
Glucose-Capillary: 99 mg/dL (ref 70–99)
Glucose-Capillary: 101 mg/dL — ABNORMAL HIGH (ref 70–99)

## 2022-05-14 MED ORDER — ARFORMOTEROL TARTRATE 15 MCG/2ML IN NEBU: 15.0000 ug | INHALATION_SOLUTION | Freq: Two times a day (BID) | RESPIRATORY_TRACT | Status: DC

## 2022-05-14 MED ORDER — ATORVASTATIN CALCIUM 80 MG PO TABS: 80.0000 mg | ORAL_TABLET | Freq: Every day | ORAL | Status: AC

## 2022-05-14 MED ORDER — SIMETHICONE 80 MG PO CHEW: 80.0000 mg | CHEWABLE_TABLET | Freq: Four times a day (QID) | ORAL | 0 refills | Status: DC

## 2022-05-14 MED ORDER — ALTEPLASE 2 MG IJ SOLR: 2.0000 mg | Freq: Once | INTRAMUSCULAR | Status: DC | PRN

## 2022-05-14 MED ORDER — DARBEPOETIN ALFA 150 MCG/0.3ML IJ SOSY: 150.0000 ug | PREFILLED_SYRINGE | INTRAMUSCULAR | Status: DC

## 2022-05-14 MED ORDER — POLYETHYLENE GLYCOL 3350 17 G PO PACK: 17.0000 g | PACK | Freq: Every day | ORAL | 0 refills | Status: DC | PRN

## 2022-05-14 MED ORDER — HEPARIN SODIUM (PORCINE) 1000 UNIT/ML DIALYSIS
1000.0000 [IU] | INTRAMUSCULAR | Status: DC | PRN
  Administered 2022-05-14: 1000 [IU]
  Filled 2022-05-14 (×3): qty 1

## 2022-05-14 MED ORDER — INSULIN DETEMIR 100 UNIT/ML ~~LOC~~ SOLN: 25.0000 [IU] | Freq: Two times a day (BID) | SUBCUTANEOUS | 11 refills | Status: DC

## 2022-05-14 MED ORDER — SEVELAMER CARBONATE 800 MG PO TABS: 2400.0000 mg | ORAL_TABLET | Freq: Three times a day (TID) | ORAL | Status: DC

## 2022-05-14 MED ORDER — SALINE SPRAY 0.65 % NA SOLN
1.0000 | NASAL | 0 refills | Status: DC | PRN
Start: 2022-05-14 — End: 2022-10-28

## 2022-05-14 MED ORDER — ALBUMIN HUMAN 25 % IV SOLN: 25.0000 g | Freq: Once | INTRAVENOUS | Status: AC

## 2022-05-14 MED ORDER — ANTICOAGULANT SODIUM CITRATE 4% (200MG/5ML) IV SOLN
5.0000 mL | Status: DC | PRN
Start: 2022-05-14 — End: 2022-05-14
  Filled 2022-05-14: qty 5

## 2022-05-14 MED ORDER — MIDODRINE HCL 5 MG PO TABS
20.0000 mg | ORAL_TABLET | Freq: Three times a day (TID) | ORAL | Status: DC
  Administered 2022-05-14: 20 mg via ORAL
  Filled 2022-05-14 (×2): qty 4

## 2022-05-14 MED ORDER — MIDODRINE HCL 10 MG PO TABS: 20.0000 mg | ORAL_TABLET | Freq: Three times a day (TID) | ORAL | Status: DC

## 2022-05-14 MED ORDER — RENA-VITE PO TABS: 1.0000 | ORAL_TABLET | Freq: Every day | ORAL | 0 refills | Status: DC

## 2022-05-14 MED ORDER — REVEFENACIN 175 MCG/3ML IN SOLN: 175.0000 ug | Freq: Every day | RESPIRATORY_TRACT | Status: DC

## 2022-05-14 MED ORDER — INSULIN ASPART 100 UNIT/ML IJ SOLN: 1.0000 [IU] | Freq: Three times a day (TID) | INTRAMUSCULAR | 11 refills | Status: AC

## 2022-05-14 MED ORDER — QUETIAPINE FUMARATE 25 MG PO TABS: 25.0000 mg | ORAL_TABLET | Freq: Every day | ORAL | Status: DC

## 2022-05-14 MED ORDER — CALCIUM CARBONATE ANTACID 500 MG PO CHEW: 1.0000 | CHEWABLE_TABLET | Freq: Three times a day (TID) | ORAL | Status: DC | PRN

## 2022-05-14 MED ORDER — ALBUTEROL SULFATE (2.5 MG/3ML) 0.083% IN NEBU
2.5000 mg | INHALATION_SOLUTION | RESPIRATORY_TRACT | 12 refills | Status: DC | PRN
Start: 2022-05-14 — End: 2023-05-10

## 2022-05-14 MED ORDER — ALBUMIN HUMAN 25 % IV SOLN
INTRAVENOUS | Status: AC
  Administered 2022-05-14: 25 g via INTRAVENOUS
  Filled 2022-05-14: qty 100

## 2022-05-14 NOTE — TOC Transition Note (Signed)
Transition of Care [facility]) - CM/SW Discharge Note   Patient Details  Name: Mathew Lara MRN: [id number] Date of Birth: 03/17/1961  Transition of Care [facility]) CM/SW Contact:  Benard Halsted, [place] Phone Number: 05/14/2022, 2:46 PM   Clinical Narrative:    Patient will DC to: [facility] SNF Anticipated DC date: 05/14/22 Family notified: Daughter, Marine scientist by: Corey Harold   Per MD patient ready for DC to [facility]. RN to call report prior to discharge [phone] room B23 bed 2). RN, patient, patient's family, and facility notified of DC. Discharge Summary and FL2 sent to facility. DC packet on chart. Ambulance transport requested for patient.   CSW will sign off for now as social work intervention is no longer needed. Please consult [place] again if new needs arise.     Final next level of care: Skilled Nursing Facility Barriers to Discharge: Barriers Resolved   Patient Goals and CMS Choice Patient states their goals for this hospitalization and ongoing recovery are:: Rehab CMS Medicare.gov Compare Post Acute Care list provided to:: Patient Choice offered to / list presented to : Patient, Adult Children  Discharge Placement   Existing PASRR number confirmed : 05/14/22          Patient chooses bed at: Other - please specify in the comment section below: [facility]) Patient to be transferred to facility by: [place] Name of family member notified: Daughter Patient and family notified of of transfer: 05/14/22  Discharge Plan and Services In-house Referral: Clinical Social Work   Post Acute Care Choice: [place]                               Social Determinants of Health (SDOH) Interventions     Readmission Risk Interventions     No data to display

## 2022-05-14 NOTE — Progress Notes (Signed)
Pt to d/c to snf today. Contacted [facility] and spoke to [place]. Clinic advised pt will d/c today and start on Monday. [name] chair time changed to 11:40 with an 11:00 arrival on Monday. This information was provided to CSW to provide to snf and to pt's daughter as well. Made correction to AVS as well. Contacted renal NP to request that orders be sent to clinic.   Melven Sartorius Renal Navigator [phone]

## 2022-05-14 NOTE — Discharge Summary (Addendum)
Physician Discharge Summary  Mathew Lara QPY:[id number] DOB: 06-20-1961 DOA: 04/10/2022  PCP: [name], MD  Admit date: 04/10/2022 Discharge date: 05/14/2022  Admitted From: Home Disposition:  SNF  Recommendations for Outpatient Follow-up:  Please check CBC, BMP during next visit Please continue to encourage using incentive spirometry and flutter valve Continue to taper midodrine Continue HD schedule on Monday Wednesday Friday CODE STATUS:FULL Diet recommendation: Renal diet  / Carb Modified with 1200 cc fluid restriction  Brief/Interim Summary:  61 year old black male known history of EtOH hep C HLD DM TY 2 HTN COPD Initial presentation [facility] 04/10/2022 epigastric pain radiating down lower abdomen cramping in nature associated with nausea--findings consistent with acute pancreatitis when admitted ./Creatinine was 41/3.0-18 8/6 overnight developed respiratory distress as well as shock placed on Levophed DKA was being treated 8/7 On pressors  8/8 BiPAP, worsening renal parameters 8/9 Tolerating CRRT, on BiPAP 8/10 has been off BiPAP, on high flow oxygen and tolerating 8/11 UOP improving. CRRT with net even UF 8/14 CT reveals worsening cavitary pneumonia and acute pancreatitis, large BM 8/15 abx changed for cavitary pneumonia 8/16 ID consult.  ID stopped vancomycin, Zosyn, azithromycin >narrowed to augmentin. CRRT stopped. 8/17 CRRT restarted.  Off pressors but placed on very high-dose midodrine 8/19 Remains off pressors, CRRT ongoing. HD cath required tPA.  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  Initially non-oliguric but now oligo-anuric. Started on CRRT 04/13/22 - 04/25/22.  Started iHD 8/21.  s/p TIJ TDC placed by IR on 8/29.(Replaced 9/7) - Ischemic ATN AKI from pancreatitis/shock 2/2 CKD 4 -->IHD MWF (now anuric) since 8/21 midodrine high doses tapered to 20  mg 3 times daily -Jardiance losartan Carrington Clamp discontinued this admission - Binder as per renal - Acidosis now ok-- to be managed by dialysis  -Patient currently hemodialysis dependent, MWF schedule, unable to be dialyzed yesterday due to malfunctioning [facility], was  replaced today by IR on 9/8, tolerated HD today.       multilobular pneumonia with acute respiratory failure 8/14 Underlying COPD OSA--- requiring 2 - 3 L of oxygen at baseline Treated   Acute pancreatitis on admission DKA on Admission and underlying DM TY 2 Resolved   Hypotension -On high-dose Midodrin, was decreased from 40> 20 mg oral 3 times daily, continue to taper as tolerated -Cortisol within normal limit, TSH within normal limit   Anemia of critical illness  Anemia of alcohol abuse And EMEA of chronic kidney disease FOBT neg 8/31, stool reported to be mushy and brown-iron 27, TSAT 9 --Aranesp 60 q. Monday, Rena-Vite--transfused X1 on 8/20   Underlying EtOH, hep C (per patient treated) Apprently underwent rx for hep C   probable ICU delirium  Acute metabolic encephalopathy Now resolved  Chronic diastolic CHF -Volume management with HD   Severe protein energy malnutrition Educated about eating drinking and mobility-discontinue calorie count seems to be tolerating 90% of meals   Diabetes mellitus, type II Admitted with DKA, resolved, currently his regimen has  been transitioned to subcu insulin  Discharge Diagnoses:  Principal Problem:   DKA (diabetic  ketoacidosis) ([place]) Active Problems:   Hypertriglyceridemia   Hypercholesterolemia   Lactic acidosis   Essential hypertension   History of alcohol abuse   Acute respiratory failure with hypoxia (HCC)   AKI on CKD stage 3b   GERD (gastroesophageal reflux disease)   Chronic obstructive pulmonary disease (HCC)   Acute pancreatitis   SIRS (systemic inflammatory response syndrome) (HCC)   Shock (HCC)   Abnormal CT of the abdomen   Cavitary pneumonia   Leukocytosis   Bile duct abnormality   Idiopathic hypotension    Discharge Instructions  Discharge Instructions     Diet - low sodium heart healthy   Complete by: As directed    Discharge instructions   Complete by: As directed    Follow with Primary MD [name], MD/SNF physician  Get CBC, CMP,  checked  by Primary MD next visit.    Activity: As tolerated with Full fall precautions use walker/cane & assistance as needed   Disposition SNF   Diet: Renal diet/carb modified with 1200 cc fluid restrictions   On your next visit with your primary care physician please Get Medicines reviewed and adjusted.   Please request your Prim.MD to go over all Hospital Tests and Procedure/Radiological results at the follow up, please get all Hospital records sent to your Prim MD by signing hospital release before you go home.   If you experience worsening of your admission symptoms, develop shortness of breath, life threatening emergency, suicidal or homicidal thoughts you must seek medical attention immediately by calling 911 or calling your MD immediately  if symptoms less severe.  You Must read complete instructions/literature along with all the possible adverse reactions/side effects for all the Medicines you take and that have been prescribed to you. Take any new Medicines after you have completely understood and accpet all the possible adverse reactions/side effects.   Do not drive, operating heavy machinery, perform activities  at heights, swimming or participation in water activities or provide baby sitting services if your were admitted for syncope or siezures until you have seen by Primary MD or a Neurologist and advised to do so again.  Do not drive when taking Pain medications.    Do not take more than prescribed Pain, Sleep and Anxiety Medications  Special Instructions: If you have smoked or chewed Tobacco  in the last 2 yrs please stop smoking, stop any regular Alcohol  and or any Recreational drug use.  Wear Seat belts while driving.   Please note  You were cared for by a hospitalist during your hospital stay. If you have any questions about your discharge medications or the care you received while you were in the hospital after you are discharged, you can call the unit and asked to speak with the hospitalist on call if the hospitalist that took care of you is not available. Once you are discharged, your primary care physician will handle any further medical issues. Please note that NO REFILLS for any discharge medications will be authorized once you are discharged, as it is imperative that you return to your primary care physician (or establish a relationship with a primary care physician if you do not have one) for your aftercare needs so that they can reassess your need for medications and monitor your lab values.   Increase activity slowly   Complete by: As directed    No wound care   Complete  by: As directed       Allergies as of 05/14/2022   No Known Allergies      Medication List     STOP taking these medications    albuterol 108 (90 Base) MCG/ACT inhaler Commonly known as: ProAir HFA Replaced by: albuterol (2.5 MG/3ML) 0.083% nebulizer solution   amLODipine 10 MG tablet Commonly known as: NORVASC   amoxicillin-clavulanate 875-125 MG tablet Commonly known as: AUGMENTIN   cholecalciferol 25 MCG (1000 UNIT) tablet Commonly known as: VITAMIN D3   DULoxetine 60 MG capsule Commonly  known as: CYMBALTA   fenofibrate 160 MG tablet   hydrALAZINE 50 MG tablet Commonly known as: APRESOLINE   hydrOXYzine 25 MG tablet Commonly known as: ATARAX   hydrOXYzine 50 MG tablet Commonly known as: ATARAX   Jardiance 10 MG Tabs tablet Generic drug: empagliflozin   Kerendia 10 MG Tabs Generic drug: Finerenone   losartan 50 MG tablet Commonly known as: COZAAR   oxyCODONE 5 MG immediate release tablet Commonly known as: Oxy IR/ROXICODONE   sildenafil 100 MG tablet Commonly known as: VIAGRA       TAKE these medications    albuterol (2.5 MG/3ML) 0.083% nebulizer solution Commonly known as: PROVENTIL Take 3 mLs (2.5 mg total) by nebulization every 4 (four) hours as needed for wheezing or shortness of breath. Replaces: albuterol 108 (90 Base) MCG/ACT inhaler   arformoterol 15 MCG/2ML Nebu Commonly known as: BROVANA Take 2 mLs (15 mcg total) by nebulization 2 (two) times daily.   aspirin EC 81 MG tablet Take 81 mg by mouth daily.   atorvastatin 80 MG tablet Commonly known as: LIPITOR Take 1 tablet (80 mg total) by mouth daily.   calcium carbonate 500 MG chewable tablet Commonly known as: TUMS - dosed in mg elemental calcium Chew 1 tablet (200 mg of elemental calcium total) by mouth every 8 (eight) hours as needed for indigestion or heartburn.   insulin aspart 100 UNIT/ML injection Commonly known as: novoLOG Inject 1-3 Units into the skin 3 (three) times daily with meals.   insulin detemir 100 UNIT/ML injection Commonly known as: LEVEMIR Inject 0.25 mLs (25 Units total) into the skin 2 (two) times daily.   loratadine 10 MG tablet Commonly known as: CLARITIN Take 1 tablet (10 mg total) by mouth daily.   midodrine 10 MG tablet Commonly known as: PROAMATINE Take 2 tablets (20 mg total) by mouth every 8 (eight) hours.   multivitamin Tabs tablet Take 1 tablet by mouth at bedtime.   pantoprazole 40 MG tablet Commonly known as: PROTONIX Take 1 tablet (40  mg total) by mouth 2 (two) times daily before a meal. 30 minutes before breakfast   polyethylene glycol 17 g packet Commonly known as: MIRALAX / GLYCOLAX Take 17 g by mouth daily as needed for mild constipation.   QUEtiapine 25 MG tablet Commonly known as: SEROQUEL Take 1 tablet (25 mg total) by mouth at bedtime.   revefenacin 175 MCG/3ML nebulizer solution Commonly known as: YUPELRI Take 3 mLs (175 mcg total) by nebulization daily. Start taking on: May 15, 2022   sevelamer carbonate 800 MG tablet Commonly known as: RENVELA Take 3 tablets (2,400 mg total) by mouth 3 (three) times daily with meals.   simethicone 80 MG chewable tablet Commonly known as: MYLICON Chew 1 tablet (80 mg total) by mouth 4 (four) times daily.   sodium chloride 0.65 % Soln nasal spray Commonly known as: OCEAN Place 1 spray into both nostrils as needed for congestion.  Contact information for follow-up providers     [place] Go on 05/14/2022.   Why: Schedule is Monday/Wednesday/Friday with 11:30 chair time.  For first appointment, patient will need to arrive at 10:45 to complete paperwork prior to treatment. Contact information: [address] [place] [address] [phone]              Contact information for after-discharge care     [facility] .   Service: Skilled Nursing Contact information: [place] [phone]                    No Known Allergies  Consultations: Renal Gi IR ID    Procedures/Studies: IR Fluoro Guide CV Line Left  Result Date: 05/13/2022 INDICATION: 61 year old male referred for exchange of nonfunctional left IJ tunneled hemodialysis catheter. EXAM: IMAGE GUIDED EXCHANGE OF TUNNELED HEMODIALYSIS CATHETER MEDICATIONS: None ANESTHESIA/SEDATION: None FLUOROSCOPY TIME:  Fluoroscopy Time: 0 minutes 18 seconds (22  mGy). COMPLICATIONS: None PROCEDURE: Informed written consent was obtained from the patient after a discussion of the risks, benefits, and alternatives to treatment. Questions regarding the procedure were encouraged and answered. The left neck and chest including the indwelling catheter were prepped with chlorhexidine in a sterile fashion, and a sterile drape was applied covering the operative field. Maximum barrier sterile technique with sterile gowns and gloves were used for the procedure. A timeout was performed prior to the initiation of the procedure. 1% lidocaine was used for local anesthesia. Aspiration was performed on the blue hub with clearance of the heparin dwell. A stiff Glidewire was advanced through the catheter into the IVC. Sutures were removed and then the catheter was removed on the wire. A new 23 cm tip to cuff catheter was placed on the stiff Glidewire. Tip was positioned in the upper right atrium. Wire was removed. The catheter aspirates and flushes normally. The catheter was flushed with appropriate volume heparin dwells. The catheter exit site was secured with a 0-Prolene retention suture. Dressings were applied. The patient tolerated the procedure well without immediate post procedural complication. IMPRESSION: Status post image guided exchange of left-sided IJ tunneled hemodialysis catheter. Signed, Dulcy Fanny. Nadene Rubins, RPVI [facility]iology Electronically Signed   By: Corrie Mckusick D.O.   On: 05/13/2022 14:41   DG Chest 2 View  Result Date: 05/08/2022 CLINICAL DATA:  Dyspnea on exertion EXAM: CHEST - 2 VIEW COMPARISON:  05/05/2022 FINDINGS: No significant change in low volume chest radiographs. Cardiomegaly with diffuse bilateral interstitial pulmonary opacity. Left chest large bore multi lumen vascular catheter. Osseous structures unremarkable. IMPRESSION: No significant change in low volume examination. Cardiomegaly with  diffuse bilateral interstitial pulmonary opacity, likely edema. No new airspace opacity. Electronically Signed   By: Delanna Ahmadi M.D.   On: 05/08/2022 19:44   DG CHEST PORT 1 VIEW  Result Date: 05/05/2022 CLINICAL DATA: Complication associated with dialysis catheter. EXAM: PORTABLE CHEST 1 VIEW COMPARISON:  05/02/2022 FINDINGS: Left dialysis catheter tip is in the SVC. Cardiomegaly with vascular congestion. Chronic changes in the left lung again noted. Left upper lobe thick walled cavitary area add not as well visualized as on prior CT. No effusions. No acute bony abnormality. IMPRESSION: Left dialysis catheter tip in the SVC. Otherwise no change.  No acute cardiopulmonary disease. Electronically Signed   By: Rolm Baptise M.D.   On: 05/05/2022 09:22   IR  Fluoro Guide CV Line Left  Result Date: 05/04/2022 CLINICAL DATA:  End-stage renal disease and need for tunneled hemodialysis catheter. Recent placement and removal of temporary dialysis catheter via right internal jugular vein. EXAM: TUNNELED CENTRAL VENOUS HEMODIALYSIS CATHETER PLACEMENT WITH ULTRASOUND AND FLUOROSCOPIC GUIDANCE ANESTHESIA/SEDATION: Moderate (conscious) sedation was employed during this procedure. A total of Versed 1.5 mg and Fentanyl 50 mcg was administered intravenously by radiology nursing. Moderate Sedation Time: 47 minutes. The patient's level of consciousness and vital signs were monitored continuously by radiology nursing throughout the procedure under my direct supervision. MEDICATIONS: 2 g IV Ancef. FLUOROSCOPY: 3 minutes and 42 seconds.  282 mGy. PROCEDURE: The procedure, risks, benefits, and alternatives were explained to the patient. Questions regarding the procedure were encouraged and answered. The patient understands and consents to the procedure. A timeout was performed prior to initiating the procedure. The right neck and chest were prepped with chlorhexidine in a sterile fashion, and a sterile drape was applied covering  the operative field. Maximum barrier sterile technique with sterile gowns and gloves were used for the procedure. Local anesthesia was provided with 1% lidocaine. During the procedure the left neck and chest were also prepped with chlorhexidine and draped. Ultrasound was performed of the right internal jugular vein and a permanent ultrasound image was recorded and saved. Initial puncture of the right internal jugular vein was performed under ultrasound guidance. Guidewire passage was attempted under fluoroscopy. Needle and wire were removed. Ultrasound was performed of the left internal jugular vein and a permanent ultrasound image was recorded and saved. After creating a small venotomy incision, a 21 gauge needle was advanced into the left internal jugular vein under direct, real-time ultrasound guidance. Ultrasound image documentation was performed. After securing guidewire access, an 8 Fr dilator was placed. A J-wire was kinked to measure appropriate catheter length. A Palindrome tunneled hemodialysis catheter measuring 23 cm from tip to cuff was chosen for placement. This was tunneled in a retrograde fashion from the chest wall to the venotomy incision. At the venotomy, serial dilatation was performed and a 15 Fr peel-away sheath was placed over a guidewire. The catheter was then placed through the sheath and the sheath removed. Final catheter positioning was confirmed and documented with a fluoroscopic spot image. The catheter was aspirated, flushed with saline, and injected with appropriate volume heparin dwells. The venotomy incision was closed with subcuticular 4-0 Vicryl. Dermabond was applied to the incision. The catheter exit site was secured with 0-Prolene retention sutures. COMPLICATIONS: None.  No pneumothorax. FINDINGS: Initial ultrasound demonstrates thrombus in the right internal jugular vein. Due to what appear to be initially slow flow, access of the vein was attempted but a guidewire would not  pass below the clavicle. Additional ultrasound demonstrates occlusive and chronic appearing thrombus in the right internal jugular vein related to prior temporary dialysis catheter placement. The left internal jugular vein is normally patent. A tunneled dialysis catheter was placed via the left internal jugular vein successfully. After catheter placement, the tip lies in the right atrium. The catheter aspirates normally and is ready for immediate use. IMPRESSION: 1. 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 2. Placement of tunneled hemodialysis catheter via the left internal jugular vein. The catheter tip lies in the right atrium. The catheter is ready for immediate use. Electronically Signed   By: Aletta Edouard M.D.   On: 05/04/2022 15:15   IR US Guide Vasc Access Left  Result  Date: 05/04/2022 CLINICAL DATA:  End-stage renal disease and need for tunneled hemodialysis catheter. Recent placement and removal of temporary dialysis catheter via right internal jugular vein. EXAM: TUNNELED CENTRAL VENOUS HEMODIALYSIS CATHETER PLACEMENT WITH ULTRASOUND AND FLUOROSCOPIC GUIDANCE ANESTHESIA/SEDATION: Moderate (conscious) sedation was employed during this procedure. A total of Versed 1.5 mg and Fentanyl 50 mcg was administered intravenously by radiology nursing. Moderate Sedation Time: 47 minutes. The patient's level of consciousness and vital signs were monitored continuously by radiology nursing throughout the procedure under my direct supervision. MEDICATIONS: 2 g IV Ancef. FLUOROSCOPY: 3 minutes and 42 seconds.  282 mGy. PROCEDURE: The procedure, risks, benefits, and alternatives were explained to the patient. Questions regarding the procedure were encouraged and answered. The patient understands and consents to the procedure. A timeout was performed prior to initiating the procedure. The right neck and chest were prepped with  chlorhexidine in a sterile fashion, and a sterile drape was applied covering the operative field. Maximum barrier sterile technique with sterile gowns and gloves were used for the procedure. Local anesthesia was provided with 1% lidocaine. During the procedure the left neck and chest were also prepped with chlorhexidine and draped. Ultrasound was performed of the right internal jugular vein and a permanent ultrasound image was recorded and saved. Initial puncture of the right internal jugular vein was performed under ultrasound guidance. Guidewire passage was attempted under fluoroscopy. Needle and wire were removed. Ultrasound was performed of the left internal jugular vein and a permanent ultrasound image was recorded and saved. After creating a small venotomy incision, a 21 gauge needle was advanced into the left internal jugular vein under direct, real-time ultrasound guidance. Ultrasound image documentation was performed. After securing guidewire access, an 8 Fr dilator was placed. A J-wire was kinked to measure appropriate catheter length. A Palindrome tunneled hemodialysis catheter measuring 23 cm from tip to cuff was chosen for placement. This was tunneled in a retrograde fashion from the chest wall to the venotomy incision. At the venotomy, serial dilatation was performed and a 15 Fr peel-away sheath was placed over a guidewire. The catheter was then placed through the sheath and the sheath removed. Final catheter positioning was confirmed and documented with a fluoroscopic spot image. The catheter was aspirated, flushed with saline, and injected with appropriate volume heparin dwells. The venotomy incision was closed with subcuticular 4-0 Vicryl. Dermabond was applied to the incision. The catheter exit site was secured with 0-Prolene retention sutures. COMPLICATIONS: None.  No pneumothorax. FINDINGS: Initial ultrasound demonstrates thrombus in the right internal jugular vein. Due to what appear to be  initially slow flow, access of the vein was attempted but a guidewire would not pass below the clavicle. Additional ultrasound demonstrates occlusive and chronic appearing thrombus in the right internal jugular vein related to prior temporary dialysis catheter placement. The left internal jugular vein is normally patent. A tunneled dialysis catheter was placed via the left internal jugular vein successfully. After catheter placement, the tip lies in the right atrium. The catheter aspirates normally and is ready for immediate use. IMPRESSION: 1. 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 2. Placement of tunneled hemodialysis catheter via the left internal jugular vein. The catheter tip lies in the right atrium. The catheter is ready for immediate use. Electronically Signed   By: Aletta Edouard M.D.   On: 05/04/2022 15:15   DG Abd Portable 1V  Result Date: 05/04/2022 CLINICAL DATA:  Feeding tube placement EXAM:  PORTABLE ABDOMEN - 1 VIEW COMPARISON:  Radiograph 05/02/2019 FINDINGS: Feeding tube tip overlies the distal descending duodenum, retracted since the prior exam. Nonobstructive bowel gas pattern. IMPRESSION: Feeding tube tip overlies the distal descending duodenum, retracted since the prior exam. Electronically Signed   By: Maurine Simmering M.D.   On: 05/04/2022 11:29   DG Chest Port 1 View  Result Date: 05/02/2022 CLINICAL DATA:  [id number].  Pneumonia follow-up. EXAM: PORTABLE CHEST 1 VIEW COMPARISON:  Portable chest August 24, chest CT April 19, 2022. FINDINGS: 5:07 a.m. Feeding tube enters the stomach but the intragastric course was not filmed. Interval removal right IJ double-lumen catheter with no pneumothorax. The known thick-walled left upper lobe cavitary lesion is less conspicuous today and may even be smaller in size and less thickened. Upper lobe emphysematous changes and bullous disease on the right are unaltered. There  is persistent basilar atelectatic change without convincing pneumonia. Mild elevation right hemidiaphragm. The sulci are sharp. Stable mediastinum with aortic tortuosity and ectasia. Cardiomegaly without CHF. IMPRESSION: Left upper lobe thick-walled cavitary lesion is less conspicuous today. Persistent basilar atelectatic bands.  Stable cardiomegaly. Electronically Signed   By: Telford Nab M.D.   On: 05/02/2022 07:32   DG Abd 1 View  Result Date: 05/01/2022 CLINICAL DATA:  Evaluate NG tube EXAM: ABDOMEN - 1 VIEW COMPARISON:  None Available. FINDINGS: The feeding tube with a weighted distal tip remains in the region of the ligament of Treitz, similar in the interval. No other changes. No new tubes identified. IMPRESSION: The feeding tube is in good position. A new tube is not visualized. No other abnormalities. Electronically Signed   By: Dorise Bullion III M.D.   On: 05/01/2022 11:58   DG CHEST PORT 1 VIEW  Result Date: 04/29/2022 CLINICAL DATA:  Pneumonia. EXAM: PORTABLE CHEST 1 VIEW COMPARISON:  Chest x-ray dated April 26, 2022. FINDINGS: Unchanged right internal jugular dialysis catheter and feeding tube. Stable cardiomediastinal silhouette. Emphysematous changes again noted with upper lobe bullae. Inflamed, thick-walled bulla in the anterior left upper lobe seen on recent CT is not apparent by x-ray. Mild basilar atelectasis. No focal consolidation, pleural effusion, or pneumothorax. No acute osseous abnormality. IMPRESSION: 1. Inflamed, thick-walled bulla in the anterior left upper lobe seen on recent CT is not apparent by x-ray. 2. COPD. Electronically Signed   By: Titus Dubin M.D.   On: 04/29/2022 10:16   VAS [place] LOWER EXTREMITY VENOUS (DVT)  Result Date: 04/28/2022  Lower Venous DVT Study Patient Name:  JOSEPHUS HARRIGER  Date of Exam:   04/28/2022 Medical Rec #: [id number]         Accession #:    [id number] Date of Birth: [birth year]         Patient Gender: M Patient Age:   61 years Exam  Location:  [facility] Procedure:      VAS [place] LOWER EXTREMITY VENOUS (DVT) Referring Phys: Ina Homes --------------------------------------------------------------------------------  Indications: Edema.  Comparison Study: No previous exam noted. Performing Technologist: Bobetta Lime BS, RVT  Examination Guidelines: A complete evaluation includes B-mode imaging, spectral Doppler, color Doppler, and power Doppler as needed of all accessible portions of each vessel. Bilateral testing is considered an integral part of a complete examination. Limited examinations for reoccurring indications may be performed as noted. The reflux portion of the exam is performed with the patient in reverse Trendelenburg.  +---------+---------------+---------+-----------+----------+--------------+ RIGHT    CompressibilityPhasicitySpontaneityPropertiesThrombus Aging +---------+---------------+---------+-----------+----------+--------------+ CFV      Full  Yes      Yes                                 +---------+---------------+---------+-----------+----------+--------------+ SFJ      Full                                                        +---------+---------------+---------+-----------+----------+--------------+ FV Prox  Full                                                        +---------+---------------+---------+-----------+----------+--------------+ FV Mid   Full                                                        +---------+---------------+---------+-----------+----------+--------------+ FV DistalFull                                                        +---------+---------------+---------+-----------+----------+--------------+ PFV      Full                                                        +---------+---------------+---------+-----------+----------+--------------+ POP      Full           Yes      Yes                                  +---------+---------------+---------+-----------+----------+--------------+ PTV      Full                                                        +---------+---------------+---------+-----------+----------+--------------+ PERO     Full                                                        +---------+---------------+---------+-----------+----------+--------------+   +---------+---------------+---------+-----------+----------+--------------+ LEFT     CompressibilityPhasicitySpontaneityPropertiesThrombus Aging +---------+---------------+---------+-----------+----------+--------------+ CFV      Full           Yes      Yes                                 +---------+---------------+---------+-----------+----------+--------------+ SFJ  Full                                                        +---------+---------------+---------+-----------+----------+--------------+ FV Prox  Full                                                        +---------+---------------+---------+-----------+----------+--------------+ FV Mid   Full                                                        +---------+---------------+---------+-----------+----------+--------------+ FV DistalFull                                                        +---------+---------------+---------+-----------+----------+--------------+ PFV      Full                                                        +---------+---------------+---------+-----------+----------+--------------+ POP      Full           Yes      Yes                                 +---------+---------------+---------+-----------+----------+--------------+ PTV      Full                                                        +---------+---------------+---------+-----------+----------+--------------+ PERO     Full                                                         +---------+---------------+---------+-----------+----------+--------------+     Summary: BILATERAL: - No evidence of deep vein thrombosis seen in the lower extremities, bilaterally. -No evidence of popliteal cyst, bilaterally.   *See table(s) above for measurements and observations. Electronically signed by Harold Barban MD on 04/28/2022 at 8:31:00 PM.    Final    ECHOCARDIOGRAM COMPLETE  Result Date: 04/27/2022    ECHOCARDIOGRAM REPORT   Patient Name:   KELVIS BERGER Date of Exam: 04/27/2022 Medical Rec #:  [id number]        Height:       68.0 in Accession #:    [id number]       Weight:       264.1 lb  Date of Birth:  12/17/1960        BSA:          2.300 m Patient Age:    29 years         BP:           116/65 mmHg Patient Gender: M                HR:           118 bpm. Exam Location:  Inpatient Procedure: 2D Echo, Cardiac Doppler and Color Doppler Indications:    DCM  History:        Patient has prior history of Echocardiogram examinations, most                 recent 12/25/2019. COPD; Risk Factors:Hypertension, Dyslipidemia                 and Former Smoker.  Sonographer:    Bernadene Person RDCS Referring Phys: [id number] Candee Furbish  Sonographer Comments: Image acquisition challenging due to patient body habitus and Image acquisition challenging due to COPD. IMPRESSIONS  1. Left ventricular ejection fraction, by estimation, is 60 to 65%. The left ventricle has normal function. The left ventricle has no regional wall motion abnormalities. Left ventricular diastolic parameters are consistent with Grade I diastolic dysfunction (impaired relaxation).  2. Right ventricular systolic function is normal. The right ventricular size is normal. Tricuspid regurgitation signal is inadequate for assessing PA pressure.  3. The mitral valve is normal in structure. No evidence of mitral valve regurgitation.  4. The aortic valve is tricuspid. Aortic valve regurgitation is not visualized.  5. There is borderline dilatation of the  ascending aorta, measuring 39 mm.  6. The inferior vena cava is normal in size with greater than 50% respiratory variability, suggesting right atrial pressure of 3 mmHg. Comparison(s): No significant change from prior study. FINDINGS  Left Ventricle: Left ventricular ejection fraction, by estimation, is 60 to 65%. The left ventricle has normal function. The left ventricle has no regional wall motion abnormalities. The left ventricular internal cavity size was normal in size. Left ventricular diastolic parameters are consistent with Grade I diastolic dysfunction (impaired relaxation). Right Ventricle: The right ventricular size is normal. Right ventricular systolic function is normal. Tricuspid regurgitation signal is inadequate for assessing PA pressure. Left Atrium: Left atrial size was normal in size. Right Atrium: Right atrial size was normal in size. Pericardium: There is no evidence of pericardial effusion. Mitral Valve: The mitral valve is normal in structure. No evidence of mitral valve regurgitation. Tricuspid Valve: Tricuspid valve regurgitation is not demonstrated. Aortic Valve: The aortic valve is tricuspid. Aortic valve regurgitation is not visualized. Aorta: There is borderline dilatation of the ascending aorta, measuring 39 mm. Venous: The inferior vena cava is normal in size with greater than 50% respiratory variability, suggesting right atrial pressure of 3 mmHg. IAS/Shunts: The interatrial septum was not well visualized.  LEFT VENTRICLE PLAX 2D LVIDd:         4.70 cm   Diastology LVIDs:         3.10 cm   LV e' medial:    6.85 cm/s LV PW:         1.10 cm   LV E/e' medial:  7.8 LV IVS:        1.10 cm   LV e' lateral:   8.92 cm/s LVOT diam:     2.30 cm   LV E/e' lateral: 6.0 LV SV:  82 LV SV Index:   36 LVOT Area:     4.15 cm  RIGHT VENTRICLE RV S prime:     12.30 cm/s TAPSE (M-mode): 0.9 cm LEFT ATRIUM           Index        RIGHT ATRIUM           Index LA diam:      4.30 cm 1.87 cm/m   RA  Area:     17.60 cm LA Vol (A4C): 47.7 ml 20.74 ml/m  RA Volume:   48.10 ml  20.91 ml/m  AORTIC VALVE LVOT Vmax:   146.00 cm/s LVOT Vmean:  101.000 cm/s LVOT VTI:    0.198 m  AORTA Ao Root diam: 3.90 cm Ao Asc diam:  3.90 cm MITRAL VALVE MV Area (PHT): 4.60 cm    SHUNTS MV Decel Time: 165 msec    Systemic VTI:  0.20 m MV E velocity: 53.40 cm/s  Systemic Diam: 2.30 cm MV A velocity: 59.20 cm/s MV E/A ratio:  0.90 Landscape architect signed by Phineas Inches Signature Date/Time: 04/27/2022/11:45:12 AM    Final    DG Chest Port 1 View  Result Date: 04/26/2022 CLINICAL DATA:  Difficulty breathing EXAM: PORTABLE CHEST 1 VIEW COMPARISON:  Previous studies including the radiograph done on 04/16/2022 and CT done on 04/19/2022 FINDINGS: Transverse diameter of heart is increased. Central pulmonary vessels are less prominent. There are linear densities in left mid and both lower lung fields. There are no new focal infiltrates. Tip of right IJ central venous catheter is seen in right atrium. There is interval placement of enteric tube. Distal portion of the tube is not included in the image. IMPRESSION: Cardiomegaly. There are linear densities in left mid and both lower lung fields suggesting subsegmental atelectasis. There is interval decrease in pulmonary vascular congestion. No new focal infiltrates are seen. Electronically Signed   By: Elmer Picker M.D.   On: 04/26/2022 15:35   US Abdomen Limited  Result Date: 04/21/2022 CLINICAL DATA:  Dilated bile duct EXAM: ULTRASOUND ABDOMEN LIMITED RIGHT UPPER QUADRANT COMPARISON:  CT abdomen/pelvis 04/19/2022, right upper quadrant ultrasound 04/10/2022 FINDINGS: Gallbladder: Surgically absent. Common bile duct: Diameter: 7 mm, within normal limits for age and given history of cholecystectomy. Previously measured 10 mm. There is no intrahepatic biliary ductal dilatation. Liver: Parenchymal echogenicity is diffusely increased likely reflecting fatty infiltration. No  focal lesions are seen. Portal vein is patent on color Doppler imaging with normal direction of blood flow towards the liver. Other: None. IMPRESSION: 1. Common bile duct measures 7 mm which is within expected limits given age and history of cholecystectomy, decreased from 10 mm on the prior study. No intrahepatic biliary ductal dilatation. 2. Probable fatty infiltration of the liver, unchanged. Electronically Signed   By: Valetta Mole M.D.   On: 04/21/2022 08:26   CT CHEST ABDOMEN PELVIS W CONTRAST  Result Date: 04/19/2022 CLINICAL DATA:  Sepsis, evaluate for source of infection. Pancreatitis EXAM: CT CHEST, ABDOMEN, AND PELVIS WITH CONTRAST TECHNIQUE: Multidetector CT imaging of the chest, abdomen and pelvis was performed following the standard protocol during bolus administration of intravenous contrast. RADIATION DOSE REDUCTION: This exam was performed according to the departmental dose-optimization program which includes automated exposure control, adjustment of the mA and/or kV according to patient size and/or use of iterative reconstruction technique. CONTRAST:  178m OMNIPAQUE IOHEXOL 300 MG/ML  SOLN COMPARISON:  CT 04/10/2022 FINDINGS: CT CHEST FINDINGS Cardiovascular: Central venous line with tip  in the distal SVC. No pericardial effusion. No acute cardio vascular findings Mediastinum/Nodes: No mediastinal adenopathy. Trachea normal. Mild thickening of the esophagus. Feeding tube extends through the esophagus into the stomach. Lungs/Pleura: Mild basilar atelectasis at the RIGHT base. Cavitary lesion in the anterior LEFT upper lobe measuring 7.2 cm by 3.4 cm. This cavitary lesion is thick-walled similar to CT 03/12/2022. Musculoskeletal: No aggressive osseous lesion. CT ABDOMEN AND PELVIS FINDINGS Hepatobiliary: Postcholecystectomy. No intrahepatic biliary duct dilatation. Enhancing lesion in the liver. Pancreas: Again demonstrated extensive fluid along the body and tail the pancreas. The pancreas is  edematous. Pancreas does enhance uniformly. There is no evidence of pancreatic ductal interruption. No organized fluid collections. Degree of pancreatic inflammation and fluid is increased from 04/10/2022. No vascular complication identified associated with the acute pancreatitis. Spleen: Normal spleen Adrenals/urinary tract: Adrenal glands and kidneys are normal. The ureters and bladder normal. Stomach/Bowel: Feeding tube extends into the fourth portion the duodenum. No small bowel obstruction. No pneumatosis or portal venous gas. Colon is normal. Vascular/Lymphatic: Abdominal aorta is normal caliber. There is no retroperitoneal or periportal lymphadenopathy. No pelvic lymphadenopathy. Reproductive: Unremarkable. Extensive streak artifact from the bowel hip prosthetics Other: No abdominal abscess Musculoskeletal: No aggressive osseous lesion. IMPRESSION: Chest Impression: 1. Mild RIGHT basilar atelectasis is new from comparison CT. 2. Stable thick-walled cavitary lesion in the LEFT upper lobe. 3. Mild thickening of the esophagus. Abdomen / Pelvis Impression: 1. Increase in fluid surrounding the pancreas compared to most recent CT. Findings consistent with acute pancreatitis. No organized fluid collections at this point. Pancreas is edematous but no evidence of necrotizing pancreatitis as the pancreas enhances. 2. Feeding tube extends in the fourth portion duodenum. 3. No evidence of bowel obstruction or bowel ischemia. Electronically Signed   By: Suzy Bouchard M.D.   On: 04/19/2022 14:46   CT HEAD WO CONTRAST (5MM)  Result Date: 04/19/2022 CLINICAL DATA:  Altered mental status. EXAM: CT HEAD WITHOUT CONTRAST TECHNIQUE: Contiguous axial images were obtained from the base of the skull through the vertex without intravenous contrast. RADIATION DOSE REDUCTION: This exam was performed according to the departmental dose-optimization program which includes automated exposure control, adjustment of the mA and/or kV  according to patient size and/or use of iterative reconstruction technique. COMPARISON:  None Available. FINDINGS: Brain: No evidence of acute infarction, hemorrhage, hydrocephalus, extra-axial collection or mass lesion/mass effect. Vascular: No hyperdense vessel or unexpected calcification. Skull: Normal. Negative for fracture or focal lesion. Sinuses/Orbits: No acute finding. Other: None. IMPRESSION: No acute intracranial abnormality seen. Electronically Signed   By: Marijo Conception M.D.   On: 04/19/2022 14:34   DG Abd Portable 1V  Result Date: 04/17/2022 CLINICAL DATA:  Abdominal distension EXAM: PORTABLE ABDOMEN - 1 VIEW COMPARISON:  April 16, 2022 FINDINGS: The feeding tube terminates in the distal third portion of the duodenum, stable. The bowel gas pattern is nonobstructive. No other acute abnormalities. IMPRESSION: The feeding tube is in stable position terminating in the distal third portion of the duodenum. No cause for distention identified. Bowel gas pattern is nonobstructive. Electronically Signed   By: Dorise Bullion III M.D.   On: 04/17/2022 10:04   DG Abd Portable 1V  Result Date: 04/16/2022 CLINICAL DATA:  NG tube placement. EXAM: PORTABLE ABDOMEN - 1 VIEW COMPARISON:  04/11/2022.  CT, 04/10/2022. FINDINGS: Enteric feeding tube has been placed, extending through the stomach, tip projecting just distal to the ligament of Treitz. IMPRESSION: 1. Well-positioned enteric feeding tube. Electronically Signed   By:  Lajean Manes M.D.   On: 04/16/2022 15:42   DG Chest 1 View  Result Date: 04/16/2022 CLINICAL DATA:  Encounter for respiratory failure EXAM: CHEST  1 VIEW COMPARISON:  Three days ago FINDINGS: Central line on the right with tip at the right atrium. Low volume chest with streaky and indistinct opacity about the hila and bases. Biapical bullous changes. No visible effusion or pneumothorax. Cardiomegaly. IMPRESSION: Stable low volume chest and pulmonary opacities. Electronically Signed    By: Jorje Guild M.D.   On: 04/16/2022 05:13      Subjective: No significant events overnight, he tolerated HD today well after changing his [facility] yesterday  Discharge Exam: Vitals:   05/14/22 1230 05/14/22 1300  BP: 112/79 121/74  Pulse: 96 (!) 106  Resp: (!) 21 20  Temp:    SpO2: 100% 98%   Vitals:   05/14/22 1148 05/14/22 1200 05/14/22 1230 05/14/22 1300  BP: 110/65 (!) 120/92 112/79 121/74  Pulse: 98 94 96 (!) 106  Resp: (!) 22 (!) 27 (!) 21 20  Temp:      TempSrc:      SpO2: 97% 100% 100% 98%  Weight:      Height:        General: Pt is alert, awake, not in acute distress Cardiovascular: RRR, S1/S2 +, no rubs, no gallops Respiratory: CTA bilaterally, no wheezing, no rhonchi Abdominal: Soft, NT, ND, bowel sounds + Extremities: no edema, no cyanosis    The results of significant diagnostics from this hospitalization (including imaging, microbiology, ancillary and laboratory) are listed below for reference.     Microbiology: No results found for this or any previous visit (from the past 240 hour(s)).   Labs: BNP (last 3 results) No results for input(s): "BNP" in the last 8760 hours. Basic Metabolic Panel: Recent Labs  Lab 05/08/22 0618 05/09/22 0022 05/10/22 0315 05/11/22 0255 05/12/22 0308 05/14/22 0251  NA 139 137 137 134* 135 135  K 4.2 4.1 4.4 4.0 3.8 4.5  CL 97* 96* 95* 94* 93* 92*  CO2 '23 23 22 22 22 23  '$ GLUCOSE 116* 126* 115* 117* 100* 91  BUN 42* 54* 70* 47* 59* 70*  CREATININE 7.82* 9.49* 11.70* 8.91* 10.74* 12.55*  CALCIUM 10.2 10.5* 10.3 10.0 10.4* 10.4*  MG 1.8 2.0 2.0 1.8 1.9  --   PHOS  --  8.0* 10.0*  --  7.6* 8.6*   Liver Function Tests: Recent Labs  Lab 05/08/22 0618 05/09/22 0022 05/10/22 0315 05/11/22 0255 05/12/22 0308 05/14/22 0251  AST 42*  --  '31 28 29  '$ --   ALT 17  --  '13 11 11  '$ --   ALKPHOS 78  --  80 86 85  --   BILITOT 0.5  --  0.5 0.6 0.7  --   PROT 8.8*  --  9.0* 8.9* 9.1*  --   ALBUMIN 2.8* 2.8* 2.9* 3.3*  3.3* 3.3*   No results for input(s): "LIPASE", "AMYLASE" in the last 168 hours. No results for input(s): "AMMONIA" in the last 168 hours. CBC: Recent Labs  Lab 05/08/22 0618 05/10/22 0315 05/11/22 0255 05/12/22 0308 05/14/22 0251  WBC 12.4* 14.3* 13.0* 11.9* 12.2*  NEUTROABS 8.9*  --   --   --   --   HGB 7.8* 8.0* 7.5* 8.2* 8.3*  HCT 25.7* 26.0* 25.2* 26.3* 27.2*  MCV 87.7 86.4 88.7 86.2 87.5  PLT 377 378 379 352 374   Cardiac Enzymes: No results for input(s): "CKTOTAL", "  CKMB", "CKMBINDEX", "TROPONINI" in the last 168 hours. BNP: Invalid input(s): "POCBNP" CBG: Recent Labs  Lab 05/13/22 1954 05/13/22 2258 05/13/22 2312 05/14/22 0310 05/14/22 0726  GLUCAP 141* 111* 94 99 101*   D-Dimer No results for input(s): "DDIMER" in the last 72 hours. Hgb A1c No results for input(s): "HGBA1C" in the last 72 hours. Lipid Profile No results for input(s): "CHOL", "HDL", "LDLCALC", "TRIG", "CHOLHDL", "LDLDIRECT" in the last 72 hours. Thyroid function studies Recent Labs    05/12/22 0308  TSH 0.998   Anemia work up No results for input(s): "VITAMINB12", "FOLATE", "FERRITIN", "TIBC", "IRON", "RETICCTPCT" in the last 72 hours. Urinalysis    Component Value Date/Time   COLORURINE YELLOW 04/10/2022 0810   APPEARANCEUR CLEAR 04/10/2022 0810   LABSPEC 1.026 04/10/2022 0810   PHURINE 5.0 04/10/2022 0810   GLUCOSEU >=500 (A) 04/10/2022 0810   HGBUR SMALL (A) 04/10/2022 0810   BILIRUBINUR NEGATIVE 04/10/2022 0810   KETONESUR 5 (A) 04/10/2022 0810   PROTEINUR 100 (A) 04/10/2022 0810   UROBILINOGEN 0.2 07/06/2013 1024   NITRITE NEGATIVE 04/10/2022 0810   LEUKOCYTESUR NEGATIVE 04/10/2022 0810   Sepsis Labs Recent Labs  Lab 05/10/22 0315 05/11/22 0255 05/12/22 0308 05/14/22 0251  WBC 14.3* 13.0* 11.9* 12.2*   Microbiology No results found for this or any previous visit (from the past 240 hour(s)).   Time coordinating discharge: Over 30 minutes  SIGNED:   Phillips Climes, MD  [facility] 05/14/2022, 1:46 PM Pager   If 7PM-7AM, please contact night-coverage [email or web address]

## 2022-05-14 NOTE — Progress Notes (Signed)
Received patient in bed to unit.  Alert and oriented.  Informed consent signed and in chart.   Treatment initiated: 0900 Treatment completed: 1347  Patient tolerated well.  Transported back to the room  Alert, without acute distress.  Hand-off given to patient's nurse.   Access used: HD cath Access issues: clotted system x1  Total UF removed: 1500 Medication(s) given: Albumin 25% 25G IVPB, Heparin dwells Post HD VS: 98.5    110/75    110    15    100% 2LNC Post HD weight: 115kg   [facility]

## 2022-05-14 NOTE — Progress Notes (Signed)
[facility] Progress Note  Name: Mathew Lara MRN: [id number] DOB: [birth year]   Subjective:  His dialysis catheter was exchanged on 9/7 and has been working well today.  Seen and examined on dialysis.  Procedure supervised.  Temp reduced to 36 degrees.  He had been taken out of [facility] for hypotension.  Giving albumin once now.  Left IJ tunn catheter in use.  Blood pressure 100/80 and HR 108.  He had 125 mL uop over 9/7.  He hopes to go home soon.   Review of systems:   Denies n/v Denies shortness of breath Denies chest pain   Intake/Output Summary (Last 24 hours) at 05/14/2022 1109 Last data filed at 05/14/2022 0618 Gross per 24 hour  Intake 400 ml  Output 125 ml  Net 275 ml    Vitals:  Vitals:   05/14/22 1030 05/14/22 1100 05/14/22 1105 05/14/22 1106  BP: 119/89 95/77 (!) 87/60 100/80  Pulse: (!) 106 (!) 117  (!) 107  Resp:  (!) 22  (!) 22  Temp:      TempSrc:      SpO2: 98% 95%  94%  Weight:      Height:         Physical Exam:      General adult male in bed in no acute distress HEENT normocephalic atraumatic extraocular movements intact sclera anicteric Neck supple trachea midline Lungs clear to auscultation bilaterally normal work of breathing at rest on 3 liters oxygen Heart S1S2 no rub Abdomen soft nontender nondistended Extremities no edema appreciated Psych normal mood and affect Access left IJ tunn catheter  Medications reviewed   Labs:     Latest Ref Rng & Units 05/14/2022    2:51 AM 05/12/2022    3:08 AM 05/11/2022    2:55 AM  BMP  Glucose 70 - 99 mg/dL 91  100  117   BUN 8 - 23 mg/dL 70  59  47   Creatinine 0.61 - 1.24 mg/dL 12.55  10.74  8.91   Sodium 135 - 145 mmol/L 135  135  134   Potassium 3.5 - 5.1 mmol/L 4.5  3.8  4.0   Chloride 98 - 111 mmol/L 92  93  94   CO2 22 - 32 mmol/L '23  22  22   '$ Calcium 8.9 - 10.3 mg/dL 10.4  10.4  10.0      Assessment/Plan:    # Anuric dialysis dependent AKI/CKD stage IIIb-IV - presumably ischemic  ATN in setting of acute pancreatitis, hypotension, pressor support, with concomitant ARB, SGLT-2 inhibitor and finerenone.  Initially non-oliguric but now oligo-anuric. Started on CRRT 04/13/22 - 04/25/22.  Started iHD 8/21.  s/p TIJ TDC placed by IR on 8/29. ----------------------- - HD per MWF schedule - Remain off of losartan, Jardiance, and Kerendia   - Consulted IR for tunneled dialysis catheter exchange   - When stable for discharge, patient has been accepted to an outpatient HD unit [facility]) for MWF spot.  He is AKI  - Would defer AVF until pressures are optimized as I am concerned that he would clot off an access given the hypotension   # hypotension - on very high dose of midodrine. midodrine was just reduced to 20 mg TID     #Acute hypoxic respiratory failure - with underlying COPD and OSA.  Currently on oxygen via nasal cannula.  optimize volume with HD.   #Hep C with advanced liver fibrosis.   #Chronic diastolic CHF - optimize  volume status with HD   #Anemia normocytic; transfuse prn.  Iron saturation low with high ferritin because of inflammatory state.  Continue aranesp - increased dose to 150 mcg every Monday      #Hyperphosphatemia: improved.  note increased sevelamer dose.  May need a stronger binder (renal to titrate binders) as an outpatient.  Hopeful for improvement with new catheter as well.  On nepro and renal diet.   #Acute metabolic encephalopathy, possible component of delirium.   # Acute pancreatitis: resolved  Disposition - per primary team; outpatient HD is arranged and tunneled catheter has worked today for HD  Claudia Desanctis, MD 05/14/2022 11:24 AM

## 2022-05-14 NOTE — TOC Progression Note (Signed)
Transition of Care [facility]) - Progression Note    Patient Details  Name: FRIEDRICH HARRIOTT MRN: [id number] Date of Birth: [birth year]  Transition of Care [facility]) CM/SW [place], LCSW Phone Number: 05/14/2022, 2:42 PM  Clinical Narrative:    [facility] ready for patient. Daughter requesting PTAR for transport. She will meet patient at dialysis on Monday to assist with paperwork.    Expected Discharge Plan: [place] Barriers to Discharge: Continued Medical Work up, Ship broker  Expected Discharge Plan and Services Expected Discharge Plan: [place] In-house Referral: Clinical Social Work   Post Acute Care Choice: [place]   Expected Discharge Date: 05/14/22                                     Social Determinants of Health (SDOH) Interventions    Readmission Risk Interventions     No data to display

## 2022-05-14 NOTE — Plan of Care (Signed)
  Problem: Clinical Measurements: Goal: Respiratory complications will improve Outcome: Not Progressing   Problem: Clinical Measurements: Goal: Cardiovascular complication will be avoided Outcome: Not Progressing   Problem: Safety: Goal: Ability to remain free from injury will improve Outcome: Not Progressing   Problem: Fluid Volume: Goal: Ability to maintain a balanced intake and output will improve Outcome: Not Progressing

## 2022-05-15 DIAGNOSIS — G4733 Obstructive sleep apnea (adult) (pediatric): Secondary | ICD-10-CM | POA: Diagnosis not present

## 2022-05-17 DIAGNOSIS — D689 Coagulation defect, unspecified: Secondary | ICD-10-CM | POA: Diagnosis not present

## 2022-05-17 DIAGNOSIS — I959 Hypotension, unspecified: Secondary | ICD-10-CM | POA: Diagnosis not present

## 2022-05-17 DIAGNOSIS — D649 Anemia, unspecified: Secondary | ICD-10-CM | POA: Diagnosis not present

## 2022-05-17 DIAGNOSIS — N178 Other acute kidney failure: Secondary | ICD-10-CM | POA: Diagnosis not present

## 2022-05-17 DIAGNOSIS — E039 Hypothyroidism, unspecified: Secondary | ICD-10-CM | POA: Diagnosis not present

## 2022-05-17 DIAGNOSIS — N186 End stage renal disease: Secondary | ICD-10-CM | POA: Diagnosis not present

## 2022-05-17 DIAGNOSIS — Z23 Encounter for immunization: Secondary | ICD-10-CM | POA: Diagnosis not present

## 2022-05-17 DIAGNOSIS — E1110 Type 2 diabetes mellitus with ketoacidosis without coma: Secondary | ICD-10-CM | POA: Diagnosis not present

## 2022-05-17 DIAGNOSIS — K859 Acute pancreatitis without necrosis or infection, unspecified: Secondary | ICD-10-CM | POA: Diagnosis not present

## 2022-05-17 DIAGNOSIS — Z992 Dependence on renal dialysis: Secondary | ICD-10-CM | POA: Diagnosis not present

## 2022-05-17 DIAGNOSIS — E119 Type 2 diabetes mellitus without complications: Secondary | ICD-10-CM | POA: Diagnosis not present

## 2022-05-17 DIAGNOSIS — R52 Pain, unspecified: Secondary | ICD-10-CM | POA: Diagnosis not present

## 2022-05-17 DIAGNOSIS — I5032 Chronic diastolic (congestive) heart failure: Secondary | ICD-10-CM | POA: Diagnosis not present

## 2022-05-18 DIAGNOSIS — D649 Anemia, unspecified: Secondary | ICD-10-CM | POA: Diagnosis not present

## 2022-05-18 DIAGNOSIS — I959 Hypotension, unspecified: Secondary | ICD-10-CM | POA: Diagnosis not present

## 2022-05-18 DIAGNOSIS — E1110 Type 2 diabetes mellitus with ketoacidosis without coma: Secondary | ICD-10-CM | POA: Diagnosis not present

## 2022-05-18 DIAGNOSIS — N186 End stage renal disease: Secondary | ICD-10-CM | POA: Diagnosis not present

## 2022-05-18 DIAGNOSIS — K859 Acute pancreatitis without necrosis or infection, unspecified: Secondary | ICD-10-CM | POA: Diagnosis not present

## 2022-05-18 DIAGNOSIS — E46 Unspecified protein-calorie malnutrition: Secondary | ICD-10-CM | POA: Diagnosis not present

## 2022-05-18 DIAGNOSIS — I5032 Chronic diastolic (congestive) heart failure: Secondary | ICD-10-CM | POA: Diagnosis not present

## 2022-05-19 DIAGNOSIS — Z992 Dependence on renal dialysis: Secondary | ICD-10-CM | POA: Diagnosis not present

## 2022-05-19 DIAGNOSIS — D649 Anemia, unspecified: Secondary | ICD-10-CM | POA: Diagnosis not present

## 2022-05-19 DIAGNOSIS — N179 Acute kidney failure, unspecified: Secondary | ICD-10-CM | POA: Diagnosis not present

## 2022-05-19 DIAGNOSIS — D689 Coagulation defect, unspecified: Secondary | ICD-10-CM | POA: Diagnosis not present

## 2022-05-19 DIAGNOSIS — Z23 Encounter for immunization: Secondary | ICD-10-CM | POA: Diagnosis not present

## 2022-05-19 DIAGNOSIS — E039 Hypothyroidism, unspecified: Secondary | ICD-10-CM | POA: Diagnosis not present

## 2022-05-19 DIAGNOSIS — R52 Pain, unspecified: Secondary | ICD-10-CM | POA: Diagnosis not present

## 2022-05-19 DIAGNOSIS — N178 Other acute kidney failure: Secondary | ICD-10-CM | POA: Diagnosis not present

## 2022-05-21 DIAGNOSIS — E1110 Type 2 diabetes mellitus with ketoacidosis without coma: Secondary | ICD-10-CM | POA: Diagnosis not present

## 2022-05-21 DIAGNOSIS — E119 Type 2 diabetes mellitus without complications: Secondary | ICD-10-CM | POA: Diagnosis not present

## 2022-05-21 DIAGNOSIS — I959 Hypotension, unspecified: Secondary | ICD-10-CM | POA: Diagnosis not present

## 2022-05-21 DIAGNOSIS — N178 Other acute kidney failure: Secondary | ICD-10-CM | POA: Diagnosis not present

## 2022-05-21 DIAGNOSIS — D689 Coagulation defect, unspecified: Secondary | ICD-10-CM | POA: Diagnosis not present

## 2022-05-21 DIAGNOSIS — Z992 Dependence on renal dialysis: Secondary | ICD-10-CM | POA: Diagnosis not present

## 2022-05-21 DIAGNOSIS — I5032 Chronic diastolic (congestive) heart failure: Secondary | ICD-10-CM | POA: Diagnosis not present

## 2022-05-21 DIAGNOSIS — E039 Hypothyroidism, unspecified: Secondary | ICD-10-CM | POA: Diagnosis not present

## 2022-05-21 DIAGNOSIS — K859 Acute pancreatitis without necrosis or infection, unspecified: Secondary | ICD-10-CM | POA: Diagnosis not present

## 2022-05-21 DIAGNOSIS — R52 Pain, unspecified: Secondary | ICD-10-CM | POA: Diagnosis not present

## 2022-05-21 DIAGNOSIS — N186 End stage renal disease: Secondary | ICD-10-CM | POA: Diagnosis not present

## 2022-05-21 DIAGNOSIS — Z23 Encounter for immunization: Secondary | ICD-10-CM | POA: Diagnosis not present

## 2022-05-21 DIAGNOSIS — D649 Anemia, unspecified: Secondary | ICD-10-CM | POA: Diagnosis not present

## 2022-05-24 DIAGNOSIS — E039 Hypothyroidism, unspecified: Secondary | ICD-10-CM | POA: Diagnosis not present

## 2022-05-24 DIAGNOSIS — R52 Pain, unspecified: Secondary | ICD-10-CM | POA: Diagnosis not present

## 2022-05-24 DIAGNOSIS — Z23 Encounter for immunization: Secondary | ICD-10-CM | POA: Diagnosis not present

## 2022-05-24 DIAGNOSIS — N178 Other acute kidney failure: Secondary | ICD-10-CM | POA: Diagnosis not present

## 2022-05-24 DIAGNOSIS — D649 Anemia, unspecified: Secondary | ICD-10-CM | POA: Diagnosis not present

## 2022-05-24 DIAGNOSIS — Z992 Dependence on renal dialysis: Secondary | ICD-10-CM | POA: Diagnosis not present

## 2022-05-24 DIAGNOSIS — D689 Coagulation defect, unspecified: Secondary | ICD-10-CM | POA: Diagnosis not present

## 2022-05-25 DIAGNOSIS — E1110 Type 2 diabetes mellitus with ketoacidosis without coma: Secondary | ICD-10-CM | POA: Diagnosis not present

## 2022-05-25 DIAGNOSIS — I5032 Chronic diastolic (congestive) heart failure: Secondary | ICD-10-CM | POA: Diagnosis not present

## 2022-05-26 DIAGNOSIS — B351 Tinea unguium: Secondary | ICD-10-CM | POA: Diagnosis not present

## 2022-05-26 DIAGNOSIS — D689 Coagulation defect, unspecified: Secondary | ICD-10-CM | POA: Diagnosis not present

## 2022-05-26 DIAGNOSIS — D649 Anemia, unspecified: Secondary | ICD-10-CM | POA: Diagnosis not present

## 2022-05-26 DIAGNOSIS — R52 Pain, unspecified: Secondary | ICD-10-CM | POA: Diagnosis not present

## 2022-05-26 DIAGNOSIS — E039 Hypothyroidism, unspecified: Secondary | ICD-10-CM | POA: Diagnosis not present

## 2022-05-26 DIAGNOSIS — M79675 Pain in left toe(s): Secondary | ICD-10-CM | POA: Diagnosis not present

## 2022-05-26 DIAGNOSIS — M79674 Pain in right toe(s): Secondary | ICD-10-CM | POA: Diagnosis not present

## 2022-05-26 DIAGNOSIS — Z23 Encounter for immunization: Secondary | ICD-10-CM | POA: Diagnosis not present

## 2022-05-26 DIAGNOSIS — Z992 Dependence on renal dialysis: Secondary | ICD-10-CM | POA: Diagnosis not present

## 2022-05-26 DIAGNOSIS — N178 Other acute kidney failure: Secondary | ICD-10-CM | POA: Diagnosis not present

## 2022-05-26 DIAGNOSIS — N179 Acute kidney failure, unspecified: Secondary | ICD-10-CM | POA: Diagnosis not present

## 2022-05-27 ENCOUNTER — Encounter (HOSPITAL_COMMUNITY): Payer: Self-pay | Admitting: Emergency Medicine

## 2022-05-27 ENCOUNTER — Inpatient Hospital Stay (HOSPITAL_COMMUNITY)
Admission: EM | Admit: 2022-05-27 | Discharge: 2022-06-01 | DRG: 391 | Disposition: A | Discharge status: Skilled Nursing Facility | Payer: Medicare Other | Source: Skilled Nursing Facility | Attending: Family Medicine | Admitting: Family Medicine

## 2022-05-27 ENCOUNTER — Emergency Department (HOSPITAL_COMMUNITY): Payer: Medicare Other

## 2022-05-27 ENCOUNTER — Other Ambulatory Visit: Payer: Self-pay

## 2022-05-27 DIAGNOSIS — J189 Pneumonia, unspecified organism: Secondary | ICD-10-CM

## 2022-05-27 DIAGNOSIS — I5032 Chronic diastolic (congestive) heart failure: Secondary | ICD-10-CM | POA: Diagnosis not present

## 2022-05-27 DIAGNOSIS — D62 Acute posthemorrhagic anemia: Secondary | ICD-10-CM | POA: Diagnosis not present

## 2022-05-27 DIAGNOSIS — K863 Pseudocyst of pancreas: Secondary | ICD-10-CM

## 2022-05-27 DIAGNOSIS — E1165 Type 2 diabetes mellitus with hyperglycemia: Secondary | ICD-10-CM | POA: Diagnosis present

## 2022-05-27 DIAGNOSIS — N2581 Secondary hyperparathyroidism of renal origin: Secondary | ICD-10-CM | POA: Diagnosis not present

## 2022-05-27 DIAGNOSIS — E1110 Type 2 diabetes mellitus with ketoacidosis without coma: Secondary | ICD-10-CM | POA: Diagnosis not present

## 2022-05-27 DIAGNOSIS — I959 Hypotension, unspecified: Secondary | ICD-10-CM | POA: Diagnosis not present

## 2022-05-27 DIAGNOSIS — R109 Unspecified abdominal pain: Secondary | ICD-10-CM | POA: Diagnosis not present

## 2022-05-27 DIAGNOSIS — D631 Anemia in chronic kidney disease: Secondary | ICD-10-CM | POA: Diagnosis not present

## 2022-05-27 DIAGNOSIS — Z79899 Other long term (current) drug therapy: Secondary | ICD-10-CM

## 2022-05-27 DIAGNOSIS — K859 Acute pancreatitis without necrosis or infection, unspecified: Secondary | ICD-10-CM

## 2022-05-27 DIAGNOSIS — E669 Obesity, unspecified: Secondary | ICD-10-CM | POA: Diagnosis present

## 2022-05-27 DIAGNOSIS — Z9049 Acquired absence of other specified parts of digestive tract: Secondary | ICD-10-CM

## 2022-05-27 DIAGNOSIS — E119 Type 2 diabetes mellitus without complications: Secondary | ICD-10-CM

## 2022-05-27 DIAGNOSIS — K5903 Drug induced constipation: Principal | ICD-10-CM | POA: Diagnosis present

## 2022-05-27 DIAGNOSIS — Z23 Encounter for immunization: Secondary | ICD-10-CM

## 2022-05-27 DIAGNOSIS — R112 Nausea with vomiting, unspecified: Secondary | ICD-10-CM | POA: Diagnosis not present

## 2022-05-27 DIAGNOSIS — R0902 Hypoxemia: Secondary | ICD-10-CM | POA: Diagnosis not present

## 2022-05-27 DIAGNOSIS — Z7982 Long term (current) use of aspirin: Secondary | ICD-10-CM

## 2022-05-27 DIAGNOSIS — Z8701 Personal history of pneumonia (recurrent): Secondary | ICD-10-CM

## 2022-05-27 DIAGNOSIS — J9811 Atelectasis: Secondary | ICD-10-CM | POA: Diagnosis present

## 2022-05-27 DIAGNOSIS — Z597 Insufficient social insurance and welfare support: Secondary | ICD-10-CM

## 2022-05-27 DIAGNOSIS — F419 Anxiety disorder, unspecified: Secondary | ICD-10-CM | POA: Diagnosis present

## 2022-05-27 DIAGNOSIS — K861 Other chronic pancreatitis: Secondary | ICD-10-CM | POA: Diagnosis not present

## 2022-05-27 DIAGNOSIS — Z96643 Presence of artificial hip joint, bilateral: Secondary | ICD-10-CM | POA: Diagnosis present

## 2022-05-27 DIAGNOSIS — E1122 Type 2 diabetes mellitus with diabetic chronic kidney disease: Secondary | ICD-10-CM | POA: Diagnosis not present

## 2022-05-27 DIAGNOSIS — D638 Anemia in other chronic diseases classified elsewhere: Secondary | ICD-10-CM

## 2022-05-27 DIAGNOSIS — K7402 Hepatic fibrosis, advanced fibrosis: Secondary | ICD-10-CM | POA: Diagnosis present

## 2022-05-27 DIAGNOSIS — K852 Alcohol induced acute pancreatitis without necrosis or infection: Secondary | ICD-10-CM | POA: Diagnosis present

## 2022-05-27 DIAGNOSIS — E878 Other disorders of electrolyte and fluid balance, not elsewhere classified: Secondary | ICD-10-CM | POA: Diagnosis present

## 2022-05-27 DIAGNOSIS — I132 Hypertensive heart and chronic kidney disease with heart failure and with stage 5 chronic kidney disease, or end stage renal disease: Secondary | ICD-10-CM | POA: Diagnosis not present

## 2022-05-27 DIAGNOSIS — T40605A Adverse effect of unspecified narcotics, initial encounter: Secondary | ICD-10-CM | POA: Diagnosis present

## 2022-05-27 DIAGNOSIS — Z87891 Personal history of nicotine dependence: Secondary | ICD-10-CM

## 2022-05-27 DIAGNOSIS — Z992 Dependence on renal dialysis: Secondary | ICD-10-CM | POA: Diagnosis not present

## 2022-05-27 DIAGNOSIS — K869 Disease of pancreas, unspecified: Secondary | ICD-10-CM

## 2022-05-27 DIAGNOSIS — R748 Abnormal levels of other serum enzymes: Secondary | ICD-10-CM | POA: Diagnosis not present

## 2022-05-27 DIAGNOSIS — F1010 Alcohol abuse, uncomplicated: Secondary | ICD-10-CM | POA: Diagnosis present

## 2022-05-27 DIAGNOSIS — N186 End stage renal disease: Secondary | ICD-10-CM

## 2022-05-27 DIAGNOSIS — R6889 Other general symptoms and signs: Secondary | ICD-10-CM | POA: Diagnosis not present

## 2022-05-27 DIAGNOSIS — E8809 Other disorders of plasma-protein metabolism, not elsewhere classified: Secondary | ICD-10-CM

## 2022-05-27 DIAGNOSIS — G4730 Sleep apnea, unspecified: Secondary | ICD-10-CM | POA: Diagnosis present

## 2022-05-27 DIAGNOSIS — D649 Anemia, unspecified: Secondary | ICD-10-CM

## 2022-05-27 DIAGNOSIS — D72829 Elevated white blood cell count, unspecified: Secondary | ICD-10-CM | POA: Diagnosis not present

## 2022-05-27 DIAGNOSIS — E46 Unspecified protein-calorie malnutrition: Secondary | ICD-10-CM | POA: Diagnosis not present

## 2022-05-27 DIAGNOSIS — E441 Mild protein-calorie malnutrition: Secondary | ICD-10-CM | POA: Diagnosis present

## 2022-05-27 DIAGNOSIS — K219 Gastro-esophageal reflux disease without esophagitis: Secondary | ICD-10-CM | POA: Diagnosis present

## 2022-05-27 DIAGNOSIS — Z6838 Body mass index (BMI) 38.0-38.9, adult: Secondary | ICD-10-CM | POA: Diagnosis not present

## 2022-05-27 DIAGNOSIS — I120 Hypertensive chronic kidney disease with stage 5 chronic kidney disease or end stage renal disease: Secondary | ICD-10-CM | POA: Diagnosis not present

## 2022-05-27 DIAGNOSIS — E871 Hypo-osmolality and hyponatremia: Secondary | ICD-10-CM | POA: Diagnosis not present

## 2022-05-27 DIAGNOSIS — E782 Mixed hyperlipidemia: Secondary | ICD-10-CM | POA: Diagnosis present

## 2022-05-27 DIAGNOSIS — K5900 Constipation, unspecified: Secondary | ICD-10-CM | POA: Diagnosis not present

## 2022-05-27 DIAGNOSIS — Z794 Long term (current) use of insulin: Secondary | ICD-10-CM

## 2022-05-27 DIAGNOSIS — Z743 Need for continuous supervision: Secondary | ICD-10-CM | POA: Diagnosis not present

## 2022-05-27 DIAGNOSIS — Z8619 Personal history of other infectious and parasitic diseases: Secondary | ICD-10-CM

## 2022-05-27 DIAGNOSIS — J449 Chronic obstructive pulmonary disease, unspecified: Secondary | ICD-10-CM | POA: Diagnosis present

## 2022-05-27 DIAGNOSIS — N179 Acute kidney failure, unspecified: Secondary | ICD-10-CM | POA: Diagnosis present

## 2022-05-27 LAB — CBC WITH DIFFERENTIAL/PLATELET
Abs Immature Granulocytes: 0.07 10*3/uL (ref 0.00–0.07)
Basophils Absolute: 0 10*3/uL (ref 0.0–0.1)
Basophils Relative: 0 %
Eosinophils Absolute: 0.4 10*3/uL (ref 0.0–0.5)
Eosinophils Relative: 3 %
HCT: 23.3 % — ABNORMAL LOW (ref 39.0–52.0)
Hemoglobin: 7.1 g/dL — ABNORMAL LOW (ref 13.0–17.0)
Immature Granulocytes: 1 %
Lymphocytes Relative: 8 %
Lymphs Abs: 1.1 10*3/uL (ref 0.7–4.0)
MCH: 27.2 pg (ref 26.0–34.0)
MCHC: 30.5 g/dL (ref 30.0–36.0)
MCV: 89.3 fL (ref 80.0–100.0)
Monocytes Absolute: 1.3 10*3/uL — ABNORMAL HIGH (ref 0.1–1.0)
Monocytes Relative: 9 %
Neutro Abs: 11.5 10*3/uL — ABNORMAL HIGH (ref 1.7–7.7)
Neutrophils Relative %: 79 %
Platelets: 242 10*3/uL (ref 150–400)
RBC: 2.61 MIL/uL — ABNORMAL LOW (ref 4.22–5.81)
RDW: 18.2 % — ABNORMAL HIGH (ref 11.5–15.5)
WBC: 14.4 10*3/uL — ABNORMAL HIGH (ref 4.0–10.5)
nRBC: 0 % (ref 0.0–0.2)

## 2022-05-27 LAB — COMPREHENSIVE METABOLIC PANEL
ALT: 16 U/L (ref 0–44)
AST: 21 U/L (ref 15–41)
Albumin: 3.3 g/dL — ABNORMAL LOW (ref 3.5–5.0)
Alkaline Phosphatase: 109 U/L (ref 38–126)
Anion gap: 14 (ref 5–15)
BUN: 32 mg/dL — ABNORMAL HIGH (ref 8–23)
CO2: 28 mmol/L (ref 22–32)
Calcium: 9.7 mg/dL (ref 8.9–10.3)
Chloride: 89 mmol/L — ABNORMAL LOW (ref 98–111)
Creatinine, Ser: 9.72 mg/dL — ABNORMAL HIGH (ref 0.61–1.24)
GFR, Estimated: 6 mL/min — ABNORMAL LOW (ref >60)
Glucose, Bld: 108 mg/dL — ABNORMAL HIGH (ref 70–99)
Potassium: 4.8 mmol/L (ref 3.5–5.1)
Sodium: 131 mmol/L — ABNORMAL LOW (ref 135–145)
Total Bilirubin: 0.7 mg/dL (ref 0.3–1.2)
Total Protein: 8.4 g/dL — ABNORMAL HIGH (ref 6.5–8.1)

## 2022-05-27 LAB — LIPASE, BLOOD: Lipase: 251 U/L — ABNORMAL HIGH (ref 11–51)

## 2022-05-27 MED ORDER — SORBITOL 70 % SOLN
960.0000 mL | TOPICAL_OIL | Freq: Once | ORAL | Status: AC
  Administered 2022-05-28: 960 mL via RECTAL
  Filled 2022-05-27: qty 240

## 2022-05-27 NOTE — ED Provider Notes (Incomplete)
[facility] EMERGENCY DEPARTMENT Provider Note   CSN: [id number] Arrival date & time: 05/27/22  2105     History {Add pertinent medical, surgical, social history, OB history to HPI:1} Chief Complaint  Patient presents with  . Abdominal Pain    Mathew Lara is a 61 y.o. male.  Pt is a 61 yo male with a complicated recent pmhx.  He does have a hx of HLD, Etoh abuse, HLD, Hep C, COPD, HTN, and chronic pain.  The pt was admitted on 8/5 for pancreatitis, alcohol abuse, multilobar pna, and sepsis.  He developed renal failure and has been on dialysis.  [name] daughter said he had a stone in his CBD, but he never had a MRCP because he was unstable.  He was d/c to the SNF on 9/8.  The pt has not had a bowel movement for 6 days.  He has had nausea and his abdomen feels swollen.  Pt has been going to dialysis and went yesterday.  His daughter said his catheter is clotted and he may not have gotten his full session yesterday.  He is supposed to get dialysis tomorrow.  She said they have arranged for the catheter to be changed, but she is not sure when.  Daughter worried about pancreatitis again and the stone in his cbd.           Home Medications Prior to Admission medications   Medication Sig Start Date End Date Taking? Authorizing Provider  albuterol (PROVENTIL) (2.5 MG/3ML) 0.083% nebulizer solution Take 3 mLs (2.5 mg total) by nebulization every 4 (four) hours as needed for wheezing or shortness of breath. 05/14/22   Elgergawy, Silver Huguenin, MD  arformoterol (BROVANA) 15 MCG/2ML NEBU Take 2 mLs (15 mcg total) by nebulization 2 (two) times daily. 05/14/22   Elgergawy, Silver Huguenin, MD  aspirin EC 81 MG tablet Take 81 mg by mouth daily.    [provider]  atorvastatin (LIPITOR) 80 MG tablet Take 1 tablet (80 mg total) by mouth daily. 05/14/22   Elgergawy, Silver Huguenin, MD  calcium carbonate (TUMS - DOSED IN MG ELEMENTAL CALCIUM) 500 MG chewable tablet Chew 1 tablet (200 mg of elemental calcium total)  by mouth every 8 (eight) hours as needed for indigestion or heartburn. 05/14/22   Elgergawy, Silver Huguenin, MD  insulin aspart (NOVOLOG) 100 UNIT/ML injection Inject 1-3 Units into the skin 3 (three) times daily with meals. 05/14/22   Elgergawy, Silver Huguenin, MD  insulin detemir (LEVEMIR) 100 UNIT/ML injection Inject 0.25 mLs (25 Units total) into the skin 2 (two) times daily. 05/14/22   Elgergawy, Silver Huguenin, MD  loratadine (CLARITIN) 10 MG tablet Take 1 tablet (10 mg total) by mouth daily. 09/02/19   Barton Dubois, MD  midodrine (PROAMATINE) 10 MG tablet Take 2 tablets (20 mg total) by mouth every 8 (eight) hours. 05/14/22   Elgergawy, Silver Huguenin, MD  multivitamin (RENA-VIT) TABS tablet Take 1 tablet by mouth at bedtime. 05/14/22   Elgergawy, Silver Huguenin, MD  pantoprazole (PROTONIX) 40 MG tablet Take 1 tablet (40 mg total) by mouth 2 (two) times daily before a meal. 30 minutes before breakfast 03/08/22 09/04/22  Annitta Needs, NP  polyethylene glycol (MIRALAX / GLYCOLAX) 17 g packet Take 17 g by mouth daily as needed for mild constipation. 05/14/22   Elgergawy, Silver Huguenin, MD  QUEtiapine (SEROQUEL) 25 MG tablet Take 1 tablet (25 mg total) by mouth at bedtime. 05/14/22   Elgergawy, Silver Huguenin, MD  revefenacin (YUPELRI) [phone]  MCG/3ML nebulizer solution Take 3 mLs (175 mcg total) by nebulization daily. 05/15/22   Elgergawy, Silver Huguenin, MD  sevelamer carbonate (RENVELA) 800 MG tablet Take 3 tablets (2,400 mg total) by mouth 3 (three) times daily with meals. 05/14/22   Elgergawy, Silver Huguenin, MD  simethicone (MYLICON) 80 MG chewable tablet Chew 1 tablet (80 mg total) by mouth 4 (four) times daily. 05/14/22   Elgergawy, Silver Huguenin, MD  sodium chloride (OCEAN) 0.65 % SOLN nasal spray Place 1 spray into both nostrils as needed for congestion. 05/14/22   Elgergawy, Silver Huguenin, MD      Allergies    Patient has no known allergies.    Review of Systems   Review of Systems  Gastrointestinal:  Positive for abdominal distention and constipation.  All other  systems reviewed and are negative.   Physical Exam Updated Vital Signs BP 119/79   Pulse (!) 102   Temp 98.1 F (36.7 C)   Resp 18   Ht '5\' 8"'$  (1.727 m)   Wt 115 kg   SpO2 100%   BMI 38.55 kg/m  Physical Exam Vitals and nursing note reviewed.  Constitutional:      Appearance: He is well-developed. He is obese.  HENT:     Head: Normocephalic and atraumatic.     Mouth/Throat:     Mouth: Mucous membranes are moist.     Pharynx: Oropharynx is clear.  Eyes:     Extraocular Movements: Extraocular movements intact.     Pupils: Pupils are equal, round, and reactive to light.  Cardiovascular:     Rate and Rhythm: Normal rate and regular rhythm.     Heart sounds: Normal heart sounds.  Pulmonary:     Effort: Pulmonary effort is normal.     Breath sounds: Normal breath sounds.  Abdominal:     General: Abdomen is flat and protuberant. Bowel sounds are decreased. There is distension.     Palpations: Abdomen is soft.  Skin:    General: Skin is warm.     Capillary Refill: Capillary refill takes less than 2 seconds.  Neurological:     General: No focal deficit present.     Mental Status: He is alert and oriented to person, place, and time.  Psychiatric:        Mood and Affect: Mood normal.        Behavior: Behavior normal.     ED Results / Procedures / Treatments   Labs (all labs ordered are listed, but only abnormal results are displayed) Labs Reviewed  CBC WITH DIFFERENTIAL/PLATELET - Abnormal; Notable for the following components:      Result Value   WBC 14.4 (*)    RBC 2.61 (*)    Hemoglobin 7.1 (*)    HCT 23.3 (*)    RDW 18.2 (*)    Neutro Abs 11.5 (*)    Monocytes Absolute 1.3 (*)    All other components within normal limits  COMPREHENSIVE METABOLIC PANEL - Abnormal; Notable for the following components:   Sodium 131 (*)    Chloride 89 (*)    Glucose, Bld 108 (*)    BUN 32 (*)    Creatinine, Ser 9.72 (*)    Total Protein 8.4 (*)    Albumin 3.3 (*)    GFR,  Estimated 6 (*)    All other components within normal limits  LIPASE, BLOOD - Abnormal; Notable for the following components:   Lipase 251 (*)    All other components within normal  limits  URINALYSIS, ROUTINE W REFLEX MICROSCOPIC  BLOOD GAS, VENOUS  TYPE AND SCREEN    EKG None  Radiology CT CHEST ABDOMEN PELVIS WO CONTRAST  Result Date: 05/27/2022 CLINICAL DATA:  Abdominal pain and left basilar atelectasis EXAM: CT CHEST, ABDOMEN AND PELVIS WITHOUT CONTRAST TECHNIQUE: Multidetector CT imaging of the chest, abdomen and pelvis was performed following the standard protocol without IV contrast. RADIATION DOSE REDUCTION: This exam was performed according to the departmental dose-optimization program which includes automated exposure control, adjustment of the mA and/or kV according to patient size and/or use of iterative reconstruction technique. COMPARISON:  04/19/2022, chest x-ray from earlier in the same day. FINDINGS: CT CHEST FINDINGS Cardiovascular: Limited due to lack of IV contrast. Mild atherosclerotic calcifications are noted. No cardiac enlargement is seen. Dialysis catheter is noted from the left jugular approach. Mediastinum/Nodes: Thoracic inlet is within normal limits. No hilar or mediastinal adenopathy is noted. The esophagus is within normal limits. Lungs/Pleura: Lungs demonstrate diffuse emphysematous changes bilaterally. Thick walled cavitary lesion is noted in the left upper lobe measuring approximately 6.5 cm in greatest dimension. This is roughly stable to that seen on the prior exam. Node parenchymal nodules are seen. No other focal infiltrate is noted. Musculoskeletal: No acute rib abnormality is noted. No compression deformities are seen. CT ABDOMEN PELVIS FINDINGS Hepatobiliary: No focal liver abnormality is seen. Status post cholecystectomy. No biliary dilatation. Pancreas: Pancreas is well visualized with a few fluid attenuation areas adjacent to the pancreas. One is near the  junction of the head and body superiorly best seen on image number 54 of series 2. This measures approximately 5.3 cm. A second fluid attenuation lesion measuring 7.3 x 5.1 cm is noted along the body of the pancreas. These changes are consistent with pseudocyst formation. The degree of inflammatory change seen previously has improved significantly. No definitive acute on chronic pancreatitis is noted at this time. Spleen: Normal in size without focal abnormality. Adrenals/Urinary Tract: Adrenal glands are within normal limits. Kidneys demonstrate no renal calculi or obstructive changes. No ureteral stones are seen. The bladder is partially distended. Stomach/Bowel: Considerable fecal material is noted throughout the colon consistent with a degree of constipation. No obstructive changes are seen. The appendix is within normal limits. Vascular/Lymphatic: Aortic atherosclerosis. No enlarged abdominal or pelvic lymph nodes. Reproductive: Prostate is unremarkable. Other: No abdominal wall hernia or abnormality. No abdominopelvic ascites. Musculoskeletal: Bilateral hip replacements are noted. Degenerative changes of lumbar spine are seen. IMPRESSION: CT of the chest: Stable appearing thick walled cavitary lesion in the left upper lobe. No new focal abnormality is noted. CT of the abdomen and pelvis: Two fluid attenuation lesion adjacent to the pancreas consistent with pseudocyst formation. Significant improvement in the degree of inflammatory change of the pancreas is noted when compared with the prior study. Gallbladder has been surgically removed. No definitive biliary ductal stones are seen. Changes consistent with colonic constipation. Electronically Signed   By: Inez Catalina M.D.   On: 05/27/2022 23:57   DG Abd Acute W/Chest  Result Date: 05/27/2022 CLINICAL DATA:  Constipation and abdominal. EXAM: DG ABDOMEN ACUTE WITH 1 VIEW CHEST COMPARISON:  Chest radiograph 05/08/2022. Chest abdomen pelvis CT 04/19/2022.  FINDINGS: Left internal jugular dialysis catheter unchanged in position. Improved lung volumes from prior exam. Cavitary process in the anterior left hemithorax is only faintly visualized by radiograph, grossly stable. Left basilar atelectasis. Heart is normal in size. No acute airspace disease. No free intra-abdominal air. No bowel dilatation to suggest obstruction.  There is a moderate volume of stool throughout the colon. Mild rectal distention with stool. Cholecystectomy clips in the right upper quadrant. Bilateral hip arthroplasties. IMPRESSION: 1. Moderate colonic stool burden with mild rectal distention with stool. No bowel obstruction or free air. 2. Improved lung volumes from prior exam with left basilar atelectasis. Cavitary process in the left lung is only faintly visualized by radiograph. Electronically Signed   By: Keith Rake M.D.   On: 05/27/2022 22:37    Procedures Procedures  {Document cardiac monitor, telemetry assessment procedure when appropriate:1}  Medications Ordered in ED Medications  sorbitol, milk of mag, mineral oil, glycerin (SMOG) enema (has no administration in time range)    ED Course/ Medical Decision Making/ A&P                           Medical Decision Making Amount and/or Complexity of Data Reviewed Labs: ordered. Radiology: ordered.   This patient presents to the ED for concern of abd pain, this involves an extensive number of treatment options, and is a complaint that carries with it a high risk of complications and morbidity.  The differential diagnosis includes pancreatitis, chole   Co morbidities that complicate the patient evaluation  HLD, Etoh abuse, HLD, Hep C, COPD, HTN, and chronic pain   Additional history obtained:  Additional history obtained from epic chart review External records from outside source obtained and reviewed including daughter   Lab Tests:  I Ordered, and personally interpreted labs.  The pertinent results  include:  ***   Imaging Studies ordered:  I ordered imaging studies including ct chest/abd/pelvis  I independently visualized and interpreted imaging which showed  CT chest:  I agree with the radiologist interpretation   Cardiac Monitoring:  The patient was maintained on a cardiac monitor.  I personally viewed and interpreted the cardiac monitored which showed an underlying rhythm of: ***   Medicines ordered and prescription drug management:  I ordered medication including ***  for ***  Reevaluation of the patient after these medicines showed that the patient {resolved/improved/worsened:23923::"improved"} I have reviewed the patients home medicines and have made adjustments as needed   Test Considered:  ***   Critical Interventions:  ***   Consultations Obtained:  I requested consultation with the ***,  and discussed lab and imaging findings as well as pertinent plan - they recommend: ***   Problem List / ED Course:  ***   Reevaluation:  After the interventions noted above, I reevaluated the patient and found that they have :{resolved/improved/worsened:23923::"improved"}   Social Determinants of Health:  ***   Dispostion:  After consideration of the diagnostic results and the patients response to treatment, I feel that the patent would benefit from ***.    {Document critical care time when appropriate:1} {Document review of labs and clinical decision tools ie heart score, Chads2Vasc2 etc:1}  {Document your independent review of radiology images, and any outside records:1} {Document your discussion with family members, caretakers, and with consultants:1} {Document social determinants of health affecting pt's care:1} {Document your decision making why or why not admission, treatments were needed:1} Final Clinical Impression(s) / ED Diagnoses Final diagnoses:  Constipation, unspecified constipation type    Rx / DC Orders ED Discharge Orders      None

## 2022-05-27 NOTE — ED Provider Notes (Signed)
[facility] EMERGENCY DEPARTMENT Provider Note   CSN: [id number] Arrival date & time: 05/27/22  2105     History  Chief Complaint  Patient presents with   Abdominal Pain    Mathew Lara is a 61 y.o. male.  Pt is a 61 yo male with a complicated recent pmhx.  He does have a hx of HLD, Etoh abuse, HLD, Hep C, COPD, HTN, and chronic pain.  The pt was admitted on 8/5 for pancreatitis, alcohol abuse, multilobar pna, and sepsis.  He developed renal failure and has been on dialysis.  [name] daughter said he had a stone in his CBD, but he never had a MRCP because he was unstable.  He was d/c to the SNF on 9/8.  The pt has not had a bowel movement for 6 days.  He has had nausea and his abdomen feels swollen.  Pt has been going to dialysis and went yesterday.  His daughter said his catheter is clotted and he may not have gotten his full session yesterday.  He is supposed to get dialysis tomorrow.  She said they have arranged for the catheter to be changed, but she is not sure when.  Daughter worried about pancreatitis again and the stone in his cbd.           Home Medications Prior to Admission medications   Medication Sig Start Date End Date Taking? Authorizing Provider  albuterol (PROVENTIL) (2.5 MG/3ML) 0.083% nebulizer solution Take 3 mLs (2.5 mg total) by nebulization every 4 (four) hours as needed for wheezing or shortness of breath. 05/14/22   Elgergawy, Silver Huguenin, MD  arformoterol (BROVANA) 15 MCG/2ML NEBU Take 2 mLs (15 mcg total) by nebulization 2 (two) times daily. 05/14/22   Elgergawy, Silver Huguenin, MD  aspirin EC 81 MG tablet Take 81 mg by mouth daily.    [provider]  atorvastatin (LIPITOR) 80 MG tablet Take 1 tablet (80 mg total) by mouth daily. 05/14/22   Elgergawy, Silver Huguenin, MD  calcium carbonate (TUMS - DOSED IN MG ELEMENTAL CALCIUM) 500 MG chewable tablet Chew 1 tablet (200 mg of elemental calcium total) by mouth every 8 (eight) hours as needed for indigestion or heartburn.  05/14/22   Elgergawy, Silver Huguenin, MD  insulin aspart (NOVOLOG) 100 UNIT/ML injection Inject 1-3 Units into the skin 3 (three) times daily with meals. 05/14/22   Elgergawy, Silver Huguenin, MD  insulin detemir (LEVEMIR) 100 UNIT/ML injection Inject 0.25 mLs (25 Units total) into the skin 2 (two) times daily. 05/14/22   Elgergawy, Silver Huguenin, MD  loratadine (CLARITIN) 10 MG tablet Take 1 tablet (10 mg total) by mouth daily. 09/02/19   Barton Dubois, MD  midodrine (PROAMATINE) 10 MG tablet Take 2 tablets (20 mg total) by mouth every 8 (eight) hours. 05/14/22   Elgergawy, Silver Huguenin, MD  multivitamin (RENA-VIT) TABS tablet Take 1 tablet by mouth at bedtime. 05/14/22   Elgergawy, Silver Huguenin, MD  pantoprazole (PROTONIX) 40 MG tablet Take 1 tablet (40 mg total) by mouth 2 (two) times daily before a meal. 30 minutes before breakfast 03/08/22 09/04/22  Annitta Needs, NP  polyethylene glycol (MIRALAX / GLYCOLAX) 17 g packet Take 17 g by mouth daily as needed for mild constipation. 05/14/22   Elgergawy, Silver Huguenin, MD  QUEtiapine (SEROQUEL) 25 MG tablet Take 1 tablet (25 mg total) by mouth at bedtime. 05/14/22   Elgergawy, Silver Huguenin, MD  revefenacin (YUPELRI) 175 MCG/3ML nebulizer solution Take 3 mLs (175 mcg total)  by nebulization daily. 05/15/22   Elgergawy, Silver Huguenin, MD  sevelamer carbonate (RENVELA) 800 MG tablet Take 3 tablets (2,400 mg total) by mouth 3 (three) times daily with meals. 05/14/22   Elgergawy, Silver Huguenin, MD  simethicone (MYLICON) 80 MG chewable tablet Chew 1 tablet (80 mg total) by mouth 4 (four) times daily. 05/14/22   Elgergawy, Silver Huguenin, MD  sodium chloride (OCEAN) 0.65 % SOLN nasal spray Place 1 spray into both nostrils as needed for congestion. 05/14/22   Elgergawy, Silver Huguenin, MD      Allergies    Patient has no known allergies.    Review of Systems   Review of Systems  Gastrointestinal:  Positive for abdominal distention and constipation.  All other systems reviewed and are negative.   Physical Exam Updated Vital  Signs BP 123/71 (BP Location: Right Arm)   Pulse 97   Temp 98.4 F (36.9 C) (Oral)   Resp 16   Ht '5\' 8"'$  (1.727 m)   Wt 115 kg   SpO2 100%   BMI 38.55 kg/m  Physical Exam Vitals and nursing note reviewed.  Constitutional:      Appearance: He is well-developed. He is obese.  HENT:     Head: Normocephalic and atraumatic.     Mouth/Throat:     Mouth: Mucous membranes are moist.     Pharynx: Oropharynx is clear.  Eyes:     Extraocular Movements: Extraocular movements intact.     Pupils: Pupils are equal, round, and reactive to light.  Cardiovascular:     Rate and Rhythm: Normal rate and regular rhythm.     Heart sounds: Normal heart sounds.  Pulmonary:     Effort: Pulmonary effort is normal.     Breath sounds: Normal breath sounds.  Abdominal:     General: Abdomen is flat and protuberant. Bowel sounds are decreased. There is distension.     Palpations: Abdomen is soft.  Skin:    General: Skin is warm.     Capillary Refill: Capillary refill takes less than 2 seconds.  Neurological:     General: No focal deficit present.     Mental Status: He is alert and oriented to person, place, and time.  Psychiatric:        Mood and Affect: Mood normal.        Behavior: Behavior normal.     ED Results / Procedures / Treatments   Labs (all labs ordered are listed, but only abnormal results are displayed) Labs Reviewed  CBC WITH DIFFERENTIAL/PLATELET - Abnormal; Notable for the following components:      Result Value   WBC 14.4 (*)    RBC 2.61 (*)    Hemoglobin 7.1 (*)    HCT 23.3 (*)    RDW 18.2 (*)    Neutro Abs 11.5 (*)    Monocytes Absolute 1.3 (*)    All other components within normal limits  COMPREHENSIVE METABOLIC PANEL - Abnormal; Notable for the following components:   Sodium 131 (*)    Chloride 89 (*)    Glucose, Bld 108 (*)    BUN 32 (*)    Creatinine, Ser 9.72 (*)    Total Protein 8.4 (*)    Albumin 3.3 (*)    GFR, Estimated 6 (*)    All other components  within normal limits  LIPASE, BLOOD - Abnormal; Notable for the following components:   Lipase 251 (*)    All other components within normal limits  URINALYSIS, ROUTINE W  REFLEX MICROSCOPIC  BLOOD GAS, VENOUS  TYPE AND SCREEN    EKG None  Radiology CT CHEST ABDOMEN PELVIS WO CONTRAST  Result Date: 05/27/2022 CLINICAL DATA:  Abdominal pain and left basilar atelectasis EXAM: CT CHEST, ABDOMEN AND PELVIS WITHOUT CONTRAST TECHNIQUE: Multidetector CT imaging of the chest, abdomen and pelvis was performed following the standard protocol without IV contrast. RADIATION DOSE REDUCTION: This exam was performed according to the departmental dose-optimization program which includes automated exposure control, adjustment of the mA and/or kV according to patient size and/or use of iterative reconstruction technique. COMPARISON:  04/19/2022, chest x-ray from earlier in the same day. FINDINGS: CT CHEST FINDINGS Cardiovascular: Limited due to lack of IV contrast. Mild atherosclerotic calcifications are noted. No cardiac enlargement is seen. Dialysis catheter is noted from the left jugular approach. Mediastinum/Nodes: Thoracic inlet is within normal limits. No hilar or mediastinal adenopathy is noted. The esophagus is within normal limits. Lungs/Pleura: Lungs demonstrate diffuse emphysematous changes bilaterally. Thick walled cavitary lesion is noted in the left upper lobe measuring approximately 6.5 cm in greatest dimension. This is roughly stable to that seen on the prior exam. Node parenchymal nodules are seen. No other focal infiltrate is noted. Musculoskeletal: No acute rib abnormality is noted. No compression deformities are seen. CT ABDOMEN PELVIS FINDINGS Hepatobiliary: No focal liver abnormality is seen. Status post cholecystectomy. No biliary dilatation. Pancreas: Pancreas is well visualized with a few fluid attenuation areas adjacent to the pancreas. One is near the junction of the head and body superiorly  best seen on image number 54 of series 2. This measures approximately 5.3 cm. A second fluid attenuation lesion measuring 7.3 x 5.1 cm is noted along the body of the pancreas. These changes are consistent with pseudocyst formation. The degree of inflammatory change seen previously has improved significantly. No definitive acute on chronic pancreatitis is noted at this time. Spleen: Normal in size without focal abnormality. Adrenals/Urinary Tract: Adrenal glands are within normal limits. Kidneys demonstrate no renal calculi or obstructive changes. No ureteral stones are seen. The bladder is partially distended. Stomach/Bowel: Considerable fecal material is noted throughout the colon consistent with a degree of constipation. No obstructive changes are seen. The appendix is within normal limits. Vascular/Lymphatic: Aortic atherosclerosis. No enlarged abdominal or pelvic lymph nodes. Reproductive: Prostate is unremarkable. Other: No abdominal wall hernia or abnormality. No abdominopelvic ascites. Musculoskeletal: Bilateral hip replacements are noted. Degenerative changes of lumbar spine are seen. IMPRESSION: CT of the chest: Stable appearing thick walled cavitary lesion in the left upper lobe. No new focal abnormality is noted. CT of the abdomen and pelvis: Two fluid attenuation lesion adjacent to the pancreas consistent with pseudocyst formation. Significant improvement in the degree of inflammatory change of the pancreas is noted when compared with the prior study. Gallbladder has been surgically removed. No definitive biliary ductal stones are seen. Changes consistent with colonic constipation. Electronically Signed   By: Inez Catalina M.D.   On: 05/27/2022 23:57   DG Abd Acute W/Chest  Result Date: 05/27/2022 CLINICAL DATA:  Constipation and abdominal. EXAM: DG ABDOMEN ACUTE WITH 1 VIEW CHEST COMPARISON:  Chest radiograph 05/08/2022. Chest abdomen pelvis CT 04/19/2022. FINDINGS: Left internal jugular dialysis  catheter unchanged in position. Improved lung volumes from prior exam. Cavitary process in the anterior left hemithorax is only faintly visualized by radiograph, grossly stable. Left basilar atelectasis. Heart is normal in size. No acute airspace disease. No free intra-abdominal air. No bowel dilatation to suggest obstruction. There is a moderate volume  of stool throughout the colon. Mild rectal distention with stool. Cholecystectomy clips in the right upper quadrant. Bilateral hip arthroplasties. IMPRESSION: 1. Moderate colonic stool burden with mild rectal distention with stool. No bowel obstruction or free air. 2. Improved lung volumes from prior exam with left basilar atelectasis. Cavitary process in the left lung is only faintly visualized by radiograph. Electronically Signed   By: Keith Rake M.D.   On: 05/27/2022 22:37    Procedures Procedures    Medications Ordered in ED Medications  sorbitol, milk of mag, mineral oil, glycerin (SMOG) enema (has no administration in time range)  ondansetron (ZOFRAN) injection 4 mg (4 mg Intravenous Given 05/28/22 0011)    ED Course/ Medical Decision Making/ A&P                           Medical Decision Making Amount and/or Complexity of Data Reviewed Labs: ordered. Radiology: ordered.  Risk Prescription drug management. Decision regarding hospitalization.   This patient presents to the ED for concern of abd pain, this involves an extensive number of treatment options, and is a complaint that carries with it a high risk of complications and morbidity.  The differential diagnosis includes pancreatitis, chole   Co morbidities that complicate the patient evaluation  HLD, Etoh abuse, HLD, Hep C, COPD, HTN, and chronic pain   Additional history obtained:  Additional history obtained from epic chart review External records from outside source obtained and reviewed including daughter   Lab Tests:  I Ordered, and personally interpreted  labs.  The pertinent results include:  cbc with wbc 14.4, hgb 7.1 (hgb 8.3 2 weeks ago); cmp with bun 32 and cr 9.72 (70 and 12.55 2 weeks ago); lip is 251 (128 1 mo ago)   Imaging Studies ordered:  I ordered imaging studies including ct chest/abd/pelvis  I independently visualized and interpreted imaging which showed  CT chest/abd/pelvis: IMPRESSION:  CT of the chest: Stable appearing thick walled cavitary lesion in  the left upper lobe. No new focal abnormality is noted.    CT of the abdomen and pelvis: Two fluid attenuation lesion adjacent  to the pancreas consistent with pseudocyst formation. Significant  improvement in the degree of inflammatory change of the pancreas is  noted when compared with the prior study.    Gallbladder has been surgically removed. No definitive biliary  ductal stones are seen.    Changes consistent with colonic constipation.    I agree with the radiologist interpretation   Cardiac Monitoring:  The patient was maintained on a cardiac monitor.  I personally viewed and interpreted the cardiac monitored which showed an underlying rhythm of: nsr   Medicines ordered and prescription drug management:  I ordered medication including zofran  for nausea and smog enema for constipation  Reevaluation of the patient after these medicines showed that the patient improved I have reviewed the patients home medicines and have made adjustments as needed   Test Considered:  ct   Critical Interventions:  ct   Consultations Obtained:  I requested consultation with the hospitalist (Dr. Josephine Cables),  and discussed lab and imaging findings as well as pertinent plan - he will admit   Problem List / ED Course:  Constipation:  enema ordered Pseudocyst formation:  pt will need admission   Reevaluation:  After the interventions noted above, I reevaluated the patient and found that they have :improved   Social Determinants of  Health:  snf  Dispostion:  After consideration of the diagnostic results and the patients response to treatment, I feel that the patent would benefit from admission.          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

## 2022-05-27 NOTE — ED Triage Notes (Signed)
Pt BIB [facility] from [facility]. Pt c/o abd pain r/t constipation for the past 6 days. Pts daughter also requested eval for stone lodged in bile duct.

## 2022-05-28 ENCOUNTER — Telehealth: Payer: Self-pay | Admitting: Gastroenterology

## 2022-05-28 DIAGNOSIS — Z992 Dependence on renal dialysis: Secondary | ICD-10-CM

## 2022-05-28 DIAGNOSIS — E8809 Other disorders of plasma-protein metabolism, not elsewhere classified: Secondary | ICD-10-CM | POA: Diagnosis not present

## 2022-05-28 DIAGNOSIS — E878 Other disorders of electrolyte and fluid balance, not elsewhere classified: Secondary | ICD-10-CM | POA: Diagnosis present

## 2022-05-28 DIAGNOSIS — K5903 Drug induced constipation: Secondary | ICD-10-CM | POA: Diagnosis present

## 2022-05-28 DIAGNOSIS — K5900 Constipation, unspecified: Secondary | ICD-10-CM | POA: Diagnosis present

## 2022-05-28 DIAGNOSIS — E119 Type 2 diabetes mellitus without complications: Secondary | ICD-10-CM

## 2022-05-28 DIAGNOSIS — N186 End stage renal disease: Secondary | ICD-10-CM

## 2022-05-28 DIAGNOSIS — Z23 Encounter for immunization: Secondary | ICD-10-CM | POA: Diagnosis present

## 2022-05-28 DIAGNOSIS — E871 Hypo-osmolality and hyponatremia: Secondary | ICD-10-CM

## 2022-05-28 DIAGNOSIS — K7402 Hepatic fibrosis, advanced fibrosis: Secondary | ICD-10-CM | POA: Diagnosis present

## 2022-05-28 DIAGNOSIS — J9811 Atelectasis: Secondary | ICD-10-CM | POA: Diagnosis present

## 2022-05-28 DIAGNOSIS — N179 Acute kidney failure, unspecified: Secondary | ICD-10-CM | POA: Diagnosis present

## 2022-05-28 DIAGNOSIS — D638 Anemia in other chronic diseases classified elsewhere: Secondary | ICD-10-CM | POA: Diagnosis not present

## 2022-05-28 DIAGNOSIS — E1122 Type 2 diabetes mellitus with diabetic chronic kidney disease: Secondary | ICD-10-CM | POA: Diagnosis present

## 2022-05-28 DIAGNOSIS — N2581 Secondary hyperparathyroidism of renal origin: Secondary | ICD-10-CM | POA: Diagnosis present

## 2022-05-28 DIAGNOSIS — E46 Unspecified protein-calorie malnutrition: Secondary | ICD-10-CM

## 2022-05-28 DIAGNOSIS — I132 Hypertensive heart and chronic kidney disease with heart failure and with stage 5 chronic kidney disease, or end stage renal disease: Secondary | ICD-10-CM | POA: Diagnosis present

## 2022-05-28 DIAGNOSIS — I5032 Chronic diastolic (congestive) heart failure: Secondary | ICD-10-CM | POA: Diagnosis present

## 2022-05-28 DIAGNOSIS — D631 Anemia in chronic kidney disease: Secondary | ICD-10-CM | POA: Diagnosis present

## 2022-05-28 DIAGNOSIS — K852 Alcohol induced acute pancreatitis without necrosis or infection: Secondary | ICD-10-CM | POA: Diagnosis present

## 2022-05-28 DIAGNOSIS — E441 Mild protein-calorie malnutrition: Secondary | ICD-10-CM | POA: Diagnosis present

## 2022-05-28 DIAGNOSIS — J449 Chronic obstructive pulmonary disease, unspecified: Secondary | ICD-10-CM | POA: Diagnosis present

## 2022-05-28 DIAGNOSIS — K869 Disease of pancreas, unspecified: Secondary | ICD-10-CM

## 2022-05-28 DIAGNOSIS — R748 Abnormal levels of other serum enzymes: Secondary | ICD-10-CM

## 2022-05-28 DIAGNOSIS — E1165 Type 2 diabetes mellitus with hyperglycemia: Secondary | ICD-10-CM | POA: Diagnosis present

## 2022-05-28 DIAGNOSIS — D72829 Elevated white blood cell count, unspecified: Secondary | ICD-10-CM

## 2022-05-28 DIAGNOSIS — K861 Other chronic pancreatitis: Secondary | ICD-10-CM | POA: Diagnosis present

## 2022-05-28 DIAGNOSIS — D62 Acute posthemorrhagic anemia: Secondary | ICD-10-CM | POA: Diagnosis not present

## 2022-05-28 DIAGNOSIS — K863 Pseudocyst of pancreas: Secondary | ICD-10-CM | POA: Diagnosis present

## 2022-05-28 DIAGNOSIS — Z6838 Body mass index (BMI) 38.0-38.9, adult: Secondary | ICD-10-CM | POA: Diagnosis not present

## 2022-05-28 DIAGNOSIS — R112 Nausea with vomiting, unspecified: Secondary | ICD-10-CM

## 2022-05-28 DIAGNOSIS — F1010 Alcohol abuse, uncomplicated: Secondary | ICD-10-CM | POA: Diagnosis present

## 2022-05-28 LAB — CBG MONITORING, ED
Glucose-Capillary: 119 mg/dL — ABNORMAL HIGH (ref 70–99)
Glucose-Capillary: 109 mg/dL — ABNORMAL HIGH (ref 70–99)
Glucose-Capillary: 121 mg/dL — ABNORMAL HIGH (ref 70–99)

## 2022-05-28 LAB — BLOOD GAS, VENOUS
Acid-Base Excess: 6.9 mmol/L — ABNORMAL HIGH (ref 0.0–2.0)
Bicarbonate: 32.3 mmol/L — ABNORMAL HIGH (ref 20.0–28.0)
Drawn by: 7222
O2 Saturation: 31 %
Patient temperature: 36.9
pCO2, Ven: 51 mmHg (ref 44–60)
pH, Ven: 7.41 (ref 7.25–7.43)
pO2, Ven: <31 mmHg — CL (ref 32–45)

## 2022-05-28 LAB — GLUCOSE, CAPILLARY
Glucose-Capillary: 134 mg/dL — ABNORMAL HIGH (ref 70–99)
Glucose-Capillary: 147 mg/dL — ABNORMAL HIGH (ref 70–99)

## 2022-05-28 LAB — HEPATITIS B SURFACE ANTIBODY,QUALITATIVE: Hep B S Ab: REACTIVE — AB

## 2022-05-28 LAB — MAGNESIUM: Magnesium: 2.4 mg/dL (ref 1.7–2.4)

## 2022-05-28 LAB — HEPATITIS B CORE ANTIBODY, TOTAL: Hep B Core Total Ab: REACTIVE — AB

## 2022-05-28 LAB — HEPATITIS C ANTIBODY: HCV Ab: REACTIVE — AB

## 2022-05-28 LAB — PHOSPHORUS: Phosphorus: 4.5 mg/dL (ref 2.5–4.6)

## 2022-05-28 LAB — HEPATITIS B SURFACE ANTIGEN: Hepatitis B Surface Ag: NONREACTIVE

## 2022-05-28 MED ORDER — ACETAMINOPHEN 650 MG RE SUPP: 650.0000 mg | Freq: Four times a day (QID) | RECTAL | Status: DC | PRN

## 2022-05-28 MED ORDER — LACTULOSE 10 GM/15ML PO SOLN
60.0000 g | Freq: Once | ORAL | Status: AC
  Administered 2022-05-28: 60 g via ORAL
  Filled 2022-05-28: qty 90

## 2022-05-28 MED ORDER — HEPARIN SODIUM (PORCINE) 1000 UNIT/ML IJ SOLN
INTRAMUSCULAR | Status: AC
  Administered 2022-05-28: 3800 [IU]
  Filled 2022-05-28: qty 10

## 2022-05-28 MED ORDER — HEPARIN SODIUM (PORCINE) 1000 UNIT/ML DIALYSIS: 1000.0000 [IU] | INTRAMUSCULAR | Status: DC | PRN

## 2022-05-28 MED ORDER — RENA-VITE PO TABS
1.0000 | ORAL_TABLET | Freq: Every day | ORAL | Status: DC
  Administered 2022-05-28 – 2022-05-31 (×4): 1 via ORAL
  Filled 2022-05-28 (×4): qty 1

## 2022-05-28 MED ORDER — CHLORHEXIDINE GLUCONATE CLOTH 2 % EX PADS
6.0000 | MEDICATED_PAD | Freq: Every day | CUTANEOUS | Status: DC
  Administered 2022-05-30: 6 via TOPICAL

## 2022-05-28 MED ORDER — ALTEPLASE 2 MG IJ SOLR
2.0000 mg | Freq: Once | INTRAMUSCULAR | Status: AC | PRN
  Administered 2022-05-28: 4 mg

## 2022-05-28 MED ORDER — POLYETHYLENE GLYCOL 3350 17 G PO PACK
17.0000 g | PACK | Freq: Two times a day (BID) | ORAL | Status: DC
  Administered 2022-05-28 – 2022-06-01 (×5): 17 g via ORAL
  Filled 2022-05-28 (×9): qty 1

## 2022-05-28 MED ORDER — ZOLPIDEM TARTRATE 5 MG PO TABS
5.0000 mg | ORAL_TABLET | Freq: Every evening | ORAL | Status: DC | PRN
  Administered 2022-05-28 – 2022-05-31 (×3): 5 mg via ORAL
  Filled 2022-05-28 (×3): qty 1

## 2022-05-28 MED ORDER — ONDANSETRON HCL 4 MG PO TABS
4.0000 mg | ORAL_TABLET | Freq: Four times a day (QID) | ORAL | Status: DC | PRN
  Filled 2022-05-28: qty 1

## 2022-05-28 MED ORDER — SENNOSIDES-DOCUSATE SODIUM 8.6-50 MG PO TABS
2.0000 | ORAL_TABLET | Freq: Every day | ORAL | Status: DC
  Administered 2022-05-28 – 2022-05-31 (×4): 2 via ORAL
  Filled 2022-05-28 (×5): qty 2

## 2022-05-28 MED ORDER — ONDANSETRON HCL 4 MG/2ML IJ SOLN: 4.0000 mg | Freq: Four times a day (QID) | INTRAMUSCULAR | Status: DC | PRN

## 2022-05-28 MED ORDER — ARFORMOTEROL TARTRATE 15 MCG/2ML IN NEBU
15.0000 ug | INHALATION_SOLUTION | Freq: Two times a day (BID) | RESPIRATORY_TRACT | Status: DC
  Administered 2022-05-28 – 2022-06-01 (×9): 15 ug via RESPIRATORY_TRACT
  Filled 2022-05-28 (×9): qty 2

## 2022-05-28 MED ORDER — ALBUTEROL SULFATE (2.5 MG/3ML) 0.083% IN NEBU: 2.5000 mg | INHALATION_SOLUTION | RESPIRATORY_TRACT | Status: DC | PRN

## 2022-05-28 MED ORDER — SEVELAMER CARBONATE 800 MG PO TABS
2400.0000 mg | ORAL_TABLET | Freq: Three times a day (TID) | ORAL | Status: DC
  Administered 2022-05-28 – 2022-06-01 (×12): 2400 mg via ORAL
  Filled 2022-05-28 (×12): qty 3

## 2022-05-28 MED ORDER — INFLUENZA VAC SPLIT QUAD 0.5 ML IM SUSY
0.5000 mL | PREFILLED_SYRINGE | INTRAMUSCULAR | Status: AC
  Administered 2022-05-29: 0.5 mL via INTRAMUSCULAR
  Filled 2022-05-28: qty 0.5

## 2022-05-28 MED ORDER — HEPARIN SODIUM (PORCINE) 5000 UNIT/ML IJ SOLN
5000.0000 [IU] | Freq: Three times a day (TID) | INTRAMUSCULAR | Status: DC
  Administered 2022-05-28 – 2022-06-01 (×12): 5000 [IU] via SUBCUTANEOUS
  Filled 2022-05-28 (×12): qty 1

## 2022-05-28 MED ORDER — GLUCERNA SHAKE PO LIQD
237.0000 mL | Freq: Three times a day (TID) | ORAL | Status: DC
  Administered 2022-05-29 (×2): 237 mL via ORAL

## 2022-05-28 MED ORDER — ACETAMINOPHEN 325 MG PO TABS
650.0000 mg | ORAL_TABLET | Freq: Four times a day (QID) | ORAL | Status: DC | PRN
  Administered 2022-05-29: 650 mg via ORAL
  Filled 2022-05-28: qty 2

## 2022-05-28 MED ORDER — INSULIN DETEMIR 100 UNIT/ML ~~LOC~~ SOLN
5.0000 [IU] | Freq: Two times a day (BID) | SUBCUTANEOUS | Status: DC
  Administered 2022-05-28 – 2022-06-01 (×8): 5 [IU] via SUBCUTANEOUS
  Filled 2022-05-28 (×11): qty 0.05

## 2022-05-28 MED ORDER — INSULIN ASPART 100 UNIT/ML IJ SOLN: 0.0000 [IU] | Freq: Three times a day (TID) | INTRAMUSCULAR | Status: DC

## 2022-05-28 MED ORDER — ATORVASTATIN CALCIUM 40 MG PO TABS
80.0000 mg | ORAL_TABLET | Freq: Every day | ORAL | Status: DC
  Administered 2022-05-28 – 2022-06-01 (×5): 80 mg via ORAL
  Filled 2022-05-28 (×5): qty 2

## 2022-05-28 MED ORDER — ONDANSETRON HCL 4 MG/2ML IJ SOLN
4.0000 mg | Freq: Once | INTRAMUSCULAR | Status: AC
  Administered 2022-05-28: 4 mg via INTRAVENOUS
  Filled 2022-05-28: qty 2

## 2022-05-28 NOTE — TOC Initial Note (Signed)
Transition of Care [facility]) - Initial/Assessment Note    Patient Details  Name: Mathew Lara MRN: [id number] Date of Birth: Aug 08, 1961  [facility] of [facility]) CM/SW Contact:    Ihor Gully, LCSW Phone Number: 05/28/2022, 11:49 AM  Clinical Narrative:                 Patient from [facility], has been in rehab for a week. Considered high risk for readmission. Admitted for constipation. Will return to rehab after hospitalization. Will need insurance auth to return to facility. Will need PT eval for ins auth. Requested attending place PT evaluation orders.   Expected Discharge Plan: Skilled Nursing Facility Barriers to Discharge: Continued Medical Work up   Patient Goals and CMS Choice Patient states their goals for this hospitalization and ongoing recovery are:: return to rehab      Expected Discharge Plan and Services Expected Discharge Plan: [place] Acute Care Choice: [place] Living arrangements for the past 2 months: Single Family Home, [place]                                      Prior Living Arrangements/Services Living arrangements for the past 2 months: Single Family Home, [place] Lives with:: Facility Resident, Relatives                   Activities of Daily Living Home Assistive Devices/Equipment: Environmental consultant (specify type), Wheelchair, CBG Meter, Oxygen, CPAP ADL Screening (condition at time of admission) Patient's cognitive ability adequate to safely complete daily activities?: Yes Is the patient deaf or have difficulty hearing?: No Does the patient have difficulty seeing, even when wearing glasses/contacts?: No Does the patient have difficulty concentrating, remembering, or making decisions?: No Patient able to express need for assistance with ADLs?: Yes Does the patient have difficulty dressing or bathing?: Yes Independently performs ADLs?: No Communication:  Independent Dressing (OT): Independent Grooming: Independent Feeding: Independent Bathing: Independent Toileting: Independent In/Out Bed: Needs assistance Is this a change from baseline?: Pre-admission baseline Walks in Home: Independent with device (comment) Does the patient have difficulty walking or climbing stairs?: No Weakness of Legs: Both Weakness of Arms/Hands: None  Permission Sought/Granted                  Emotional Assessment              Admission diagnosis:  Constipation [K59.00] Patient Active Problem List   Diagnosis Date Noted   Constipation 05/28/2022   Nausea & vomiting 05/28/2022   End stage renal disease on dialysis ([place]) 05/28/2022   Anemia of chronic disease 05/28/2022   Pancreatic pseudocyst 05/28/2022   Elevated lipase 05/28/2022   Hyponatremia 05/28/2022   Type 2 diabetes mellitus ([place]) 05/28/2022   Hypoalbuminemia due to protein-calorie malnutrition ([place]) 05/28/2022   Idiopathic hypotension    Cavitary pneumonia    Leukocytosis    Bile duct abnormality    Abnormal CT of the abdomen    Shock ([place])    DKA (diabetic ketoacidosis) ([place]) 04/10/2022   Acute pancreatitis 04/10/2022   SIRS (systemic inflammatory response syndrome) ([place]) 04/10/2022   Hypertriglyceridemia 04/10/2022   Hypercholesterolemia 04/10/2022   Lactic acidosis 04/10/2022   Abdominal bloating 02/17/2022   Encounter for screening colonoscopy 06/17/2021   Fatty liver 03/05/2021   Chronic cholecystitis 02/24/2021   Exercise hypoxemia 06/03/2020   OSA on  CPAP 06/03/2020   Healthcare maintenance 06/03/2020   ILD (interstitial lung disease) ([place]) 10/02/2019   GERD (gastroesophageal reflux disease) 10/02/2019   COPD with acute exacerbation ([place]) 09/28/2019   AKI on CKD stage 3b 09/19/2019   Acute respiratory failure with hypoxia ([facility])    Chronic pain syndrome    History of COVID-19 08/29/2019   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   Acute renal  failure ([place]) 03/26/2018   Rash 03/26/2018   Acute kidney failure, unspecified ([place]) 03/26/2018   Chest pain 03/08/2018   CKD (chronic kidney disease), stage III ([place]) 03/08/2018   COPD (chronic obstructive pulmonary disease) ([place]) 03/08/2018   Chronic kidney disease, stage 3 unspecified ([place]) 03/08/2018   Chronic obstructive pulmonary disease ([place]) 03/08/2018   Avascular necrosis of bone of left hip ([place]) 07/27/2017   Avascular necrosis of bone of right hip ([place]) 06/04/2016   Status post total replacement of right hip 06/04/2016   Avascular necrosis of bone of hip ([place]) 06/04/2016   Hepatitis C 03/21/2015   Viral hepatitis C 03/21/2015   History of alcohol abuse 11/08/2013   Former smoker 11/08/2013   Tobacco user 11/08/2013   Essential hypertension 11/07/2013   Abnormal ECG 11/07/2013   PCP:  [name], MD Pharmacy:   [place], [place] - [address] [place] [address] Phone: [phone] Fax: [phone]     Social Determinants of Health (SDOH) Interventions    Readmission Risk Interventions     No data to display

## 2022-05-28 NOTE — Procedures (Signed)
   HEMODIALYSIS TREATMENT NOTE:   Uneventful 3.5 hour heparin-free session completed using [facility] TDC.  Cath entry site is unremarkable.  Activase was instilled x 60 minutes prior to starting HD.  He had three large BMs ([place] 3, then 2).  Goal was lowered slightly d/t declining BP (asymptomatic) as low as 104/83.  Net UF 1.9 liters.  All blood was returned.  Hand-off given to Delsa Sale, RN.   Rockwell Alexandria, RN

## 2022-05-28 NOTE — ED Notes (Signed)
Report received. Pt has had 3 BMs since SMOG enema.

## 2022-05-28 NOTE — ED Notes (Signed)
Pt sleeping, arousable to voice, NAD, calm, interactive, answers questions appropriately, drifts back to sleep, states "feels better", denies pain or nausea, some sob, baseline "where's  O2 at night". Reports 3 BMs since enema.

## 2022-05-28 NOTE — Progress Notes (Signed)
Patient seen and evaluated, chart reviewed, please see EMR for updated orders. Please see full H&P dictated by admitting physician Dr. Josephine Cables for same date of service.    Brief Summary:- 61 y.o. male with medical history significant of COPD, T2DM, HLD, chronic diastolic CHF, hepatitis C, mixed hyperlipidemia, alcohol abuse (several months ago), CKD, and anxiety disorder recently treated for pancreatitis with opiates subsequently developed severe constipation/obstipation with abdominal pain and nausea -Admitted 05/28/2022 for same  A/p 1) severe constipation/obstipation-----imaging studies noted -Abdominal pain and nausea persist -No BM in about 1 week prior to admission -Some BM today with enema -Give oral lactulose, MiraLAX and Senokot-S -Schedule Dulcolax nightly -Be judicious with opiates  2) acute alcoholic pancreatitis----lipase is up to 251 -Abdominal pain and nausea persist -Repeat imaging studies suggest pseudocyst formation -GI consult requested -Plan liquid diet and PPI for now  3)ESRD--nephrology consult for HD on Mondays Wednesdays and Fridays  4)Leukocytosis---??  Reactive in the setting of obstipation and pseudocyst formation /pancreatitis -Recently patient has had persistent leukocytosis  5)DM2-A1c 12.5 reflecting uncontrolled diabetes with persistent hyperglycemia -Continue Levemir insulin Use Novolog/Humalog Sliding scale insulin with Accu-Cheks/Fingersticks as ordered   6) anemia of chronic kidney disease--patient appears to have acute on chronic anemia at this time with hemoglobin drifting down -EPO/ESA agent per nephrology team -Hgb 7.1... Baseline hemoglobin usually around 8 -No overt bleeding noted  7) hyponatremia--- the setting of nausea and poor oral intake -Continue to use hemodialysis to address volume status -Avoid dehydration  Patient seen and evaluated, chart reviewed, please see EMR for updated orders. Please see full H&P dictated by admitting  physician Dr. Josephine Cables for same date of service.   -Total care time over 54 minutes  -Nephrology and gastroenterology consult requested  Roxan Hockey, MD

## 2022-05-28 NOTE — H&P (Signed)
History and Physical    Patient: Mathew Lara OZD:[id number] DOB: 12-20-1960 DOA: 05/27/2022 DOS: the patient was seen and examined on 05/28/2022 PCP: [name], MD  Patient coming from: Home  Chief Complaint:  Chief Complaint  Patient presents with   Abdominal Pain   HPI: Mathew Lara is a 61 y.o. male with medical history significant of COPD, T2DM, essential hypertension, chronic diastolic CHF, hepatitis C, mixed hyperlipidemia, alcohol abuse, CKD, anxiety who presents to the emergency department due to several days of onset of abdominal pain.  Patient states that he has not had any bowel movement in 6 days, he complained of distended abdomen and nausea.  She was unable to provide detailed history, some of the history was obtained from ED physician and ED medical record. Patient's catheter was noted to be clotted when he went for dialysis on Wednesday 9/20 and it was not certain if he got a full session of HD Patient was admitted on 8/5 due to pancreatitis, multilobar pneumonia, alcohol abuse and sepsis, during hospitalization he developed renal failure and was started on dialysis.  Apparently, patient was supposed to have MRCP while being hospitalized at that time, but because of being unstable, this was not done. He was discharged on 9/8. Daughter was concerned about common bile duct stone and patient having pancreatitis again, so it was suggested for patient to go to the ED for further evaluation and management.  ED Course:  In the emergency department, he was tachycardic, but other vital signs were within normal range.  Work-up in the ED showed leukocytosis, anemia of chronic disease, lipase 251, BMP showed hyponatremia, hypochloremia, BUNs/creatinine 32/9.72, albumin 3.3. CT chest without contrast showed stable appearing thick-walled cavitary lesion in the left upper lobe.  No new focal abnormality is noted CT of abdomen and pelvis without contrast showed: Two fluid  attenuation lesion adjacent to the pancreas consistent with pseudocyst formation. Significant improvement in the degree of inflammatory change of the pancreas is noted when compared with the prior study. Gallbladder has been surgically removed. No definitive biliary ductal stones are seen.       Changes consistent with colonic constipation. Abdominal x-ray showed: 1. Moderate colonic stool burden with mild rectal distention with stool. No bowel obstruction or free air. 2. Improved lung volumes from prior exam with left basilar atelectasis. Cavitary process in the left lung is only faintly visualized by radiograph. Zofran was given an enema was given to patient due to constipation.  Hospitalist was asked to admit patient for further evaluation and management.  Review of Systems: Review of systems as noted in the HPI. All other systems reviewed and are negative.   Past Medical History:  Diagnosis Date   Alcohol abuse    Anxiety    Bronchitis    Chronic hip pain    CKD (chronic kidney disease) stage 3, GFR 30-59 ml/min (HCC)    COPD (chronic obstructive pulmonary disease) ([place])    DKA (diabetic ketoacidosis) ([place]) 04/10/2022   Essential hypertension    Hepatitis C    Mixed hyperlipidemia    Sleep apnea    Past Surgical History:  Procedure Laterality Date   BALLOON DILATION N/A 07/06/2021   Procedure: BALLOON DILATION;  Surgeon: [name], DO;  [facility];  Service: Endoscopy;  Laterality: N/A;   BIOPSY  07/06/2021   Procedure: BIOPSY;  Surgeon: [name], DO;  [facility];  Service: Endoscopy;;   CATARACT EXTRACTION Right  CHOLECYSTECTOMY N/A 03/23/2021   Procedure: LAPAROSCOPIC CHOLECYSTECTOMY;  Surgeon: [name], MD;  Location: [facility];  Service: General;  Laterality: N/A;   COLONOSCOPY N/A 01/23/2014   SLF:The LEFT COLON IS EXTREMELY redundant/TWO RECTAL POLYPS REMOVED/SMALL VOLUME RECTAL BLEEDING MOST LIKELY DUE TO Small internal  hemorrhoids. path with prolapse type polyp of benign-mucosa. no adenomas   COLONOSCOPY WITH PROPOFOL N/A 07/06/2021   non-bleeding internal hemorrhoids, one 8 mm polyp in sigmod s/p removal and clip placed. One 5 mm polyp at recto-sigmoid s/p removal. 3 year surveillance. Tubular adenomas   ESOPHAGOGASTRODUODENOSCOPY N/A 01/23/2014   DUK:GURKYHCW ring at the gastroesophageal junction/Medium sized hiatal hernia/NDYSPEPSIA MOST LIKELY DUE TO GERD/MILD Non-erosive gastritis   ESOPHAGOGASTRODUODENOSCOPY N/A 04/18/2018   food impaction, small hiatal hernia   ESOPHAGOGASTRODUODENOSCOPY (EGD) WITH PROPOFOL N/A 07/06/2021   moderate Schatzki ring s/p dilation, gastritis s/p biopsy, normal duodenum. Negative H.pylori.   HEMORRHOID BANDING  01/23/2014   Procedure: HEMORRHOID BANDING;  Surgeon: Danie Binder, MD;  [facility];  Service: Endoscopy;;   IR FLUORO GUIDE CV LINE LEFT  05/04/2022   IR FLUORO GUIDE CV LINE LEFT  05/13/2022   IR US GUIDE VASC ACCESS LEFT  05/04/2022   JOINT REPLACEMENT Right 2017   LIVER BIOPSY N/A 03/23/2021   Procedure: LAPAROSCOPIC LIVER BIOPSY;  Surgeon: [name], MD;  Location: [facility];  Service: General;  Laterality: N/A;   MOUTH SURGERY     POLYPECTOMY  07/06/2021   Procedure: POLYPECTOMY;  Surgeon: [name], DO;  [facility];  Service: Endoscopy;;   TOTAL HIP ARTHROPLASTY Right 06/04/2016   Procedure: RIGHT TOTAL HIP ARTHROPLASTY ANTERIOR APPROACH;  Surgeon: Mcarthur Rossetti, MD;  Location: [facility];  Service: Orthopedics;  Laterality: Right;   TOTAL HIP ARTHROPLASTY Left 03/02/2019   Procedure: LEFT TOTAL HIP ARTHROPLASTY ANTERIOR APPROACH;  Surgeon: Mcarthur Rossetti, MD;  Location: [facility];  Service: Orthopedics;  Laterality: Left;    Social History:  reports that he quit smoking about 8 years ago. His smoking use included cigarettes. He started smoking about 50 years ago. He has a 42.00 pack-year smoking history. He  has been exposed to tobacco smoke. He has never used smokeless tobacco. He reports that he does not currently use alcohol. He reports that he does not use drugs.   No Known Allergies  Family History  Problem Relation Age of Onset   Cancer Mother    Colon cancer Neg Hx    Colon polyps Neg Hx      Prior to Admission medications   Medication Sig Start Date End Date Taking? Authorizing Provider  albuterol (PROVENTIL) (2.5 MG/3ML) 0.083% nebulizer solution Take 3 mLs (2.5 mg total) by nebulization every 4 (four) hours as needed for wheezing or shortness of breath. 05/14/22   Elgergawy, Silver Huguenin, MD  arformoterol (BROVANA) 15 MCG/2ML NEBU Take 2 mLs (15 mcg total) by nebulization 2 (two) times daily. 05/14/22   Elgergawy, Silver Huguenin, MD  aspirin EC 81 MG tablet Take 81 mg by mouth daily.    [provider]  atorvastatin (LIPITOR) 80 MG tablet Take 1 tablet (80 mg total) by mouth daily. 05/14/22   Elgergawy, Silver Huguenin, MD  calcium carbonate (TUMS - DOSED IN MG ELEMENTAL CALCIUM) 500 MG chewable tablet Chew 1 tablet (200 mg of elemental calcium total) by mouth every 8 (eight) hours as needed for indigestion or heartburn. 05/14/22   Elgergawy, Silver Huguenin, MD  insulin aspart (NOVOLOG) 100 UNIT/ML  injection Inject 1-3 Units into the skin 3 (three) times daily with meals. 05/14/22   Elgergawy, Silver Huguenin, MD  insulin detemir (LEVEMIR) 100 UNIT/ML injection Inject 0.25 mLs (25 Units total) into the skin 2 (two) times daily. 05/14/22   Elgergawy, Silver Huguenin, MD  loratadine (CLARITIN) 10 MG tablet Take 1 tablet (10 mg total) by mouth daily. 09/02/19   Barton Dubois, MD  midodrine (PROAMATINE) 10 MG tablet Take 2 tablets (20 mg total) by mouth every 8 (eight) hours. 05/14/22   Elgergawy, Silver Huguenin, MD  multivitamin (RENA-VIT) TABS tablet Take 1 tablet by mouth at bedtime. 05/14/22   Elgergawy, Silver Huguenin, MD  pantoprazole (PROTONIX) 40 MG tablet Take 1 tablet (40 mg total) by mouth 2 (two) times daily before a meal. 30  minutes before breakfast 03/08/22 09/04/22  Annitta Needs, NP  polyethylene glycol (MIRALAX / GLYCOLAX) 17 g packet Take 17 g by mouth daily as needed for mild constipation. 05/14/22   Elgergawy, Silver Huguenin, MD  QUEtiapine (SEROQUEL) 25 MG tablet Take 1 tablet (25 mg total) by mouth at bedtime. 05/14/22   Elgergawy, Silver Huguenin, MD  revefenacin (YUPELRI) 175 MCG/3ML nebulizer solution Take 3 mLs (175 mcg total) by nebulization daily. 05/15/22   Elgergawy, Silver Huguenin, MD  sevelamer carbonate (RENVELA) 800 MG tablet Take 3 tablets (2,400 mg total) by mouth 3 (three) times daily with meals. 05/14/22   Elgergawy, Silver Huguenin, MD  simethicone (MYLICON) 80 MG chewable tablet Chew 1 tablet (80 mg total) by mouth 4 (four) times daily. 05/14/22   Elgergawy, Silver Huguenin, MD  sodium chloride (OCEAN) 0.65 % SOLN nasal spray Place 1 spray into both nostrils as needed for congestion. 05/14/22   Elgergawy, Silver Huguenin, MD    Physical Exam: BP 113/77   Pulse 97   Temp 98.4 F (36.9 C) (Oral)   Resp 15   Ht '5\' 8"'$  (1.727 m)   Wt 115 kg   SpO2 91%   BMI 38.55 kg/m   General: 60 y.o. year-old obese male well developed well nourished in no acute distress.  Alert and oriented x3. HEENT: NCAT, EOMI Neck: Supple, trachea medial Cardiovascular: Regular rate and rhythm with no rubs or gallops.  No thyromegaly or JVD noted.  No lower extremity edema. 2/4 pulses in all 4 extremities. Respiratory: Clear to auscultation with no wheezes or rales. Good inspiratory effort. Abdomen: Soft, nontender nondistended with decreased bowel sounds x4 quadrants. Muskuloskeletal: No cyanosis, clubbing or edema noted bilaterally Neuro: CN II-XII intact, strength 5/5 x 4, sensation, reflexes intact Skin: No ulcerative lesions noted or rashes Psychiatry: Judgement and insight appear normal. Mood is appropriate for condition and setting          Labs on Admission:  Basic Metabolic Panel: Recent Labs  Lab 05/27/22 2202  NA 131*  K 4.8  CL 89*  CO2 28   GLUCOSE 108*  BUN 32*  CREATININE 9.72*  CALCIUM 9.7   Liver Function Tests: Recent Labs  Lab 05/27/22 2202  AST 21  ALT 16  ALKPHOS 109  BILITOT 0.7  PROT 8.4*  ALBUMIN 3.3*   Recent Labs  Lab 05/27/22 2202  LIPASE 251*   No results for input(s): "AMMONIA" in the last 168 hours. CBC: Recent Labs  Lab 05/27/22 2202  WBC 14.4*  NEUTROABS 11.5*  HGB 7.1*  HCT 23.3*  MCV 89.3  PLT 242   Cardiac Enzymes: No results for input(s): "CKTOTAL", "CKMB", "CKMBINDEX", "TROPONINI" in the last 168 hours.  BNP (last 3 results) No results for input(s): "BNP" in the last 8760 hours.  ProBNP (last 3 results) No results for input(s): "PROBNP" in the last 8760 hours.  CBG: Recent Labs  Lab 05/28/22 0456  GLUCAP 119*    Radiological Exams on Admission: CT CHEST ABDOMEN PELVIS WO CONTRAST  Result Date: 05/27/2022 CLINICAL DATA:  Abdominal pain and left basilar atelectasis EXAM: CT CHEST, ABDOMEN AND PELVIS WITHOUT CONTRAST TECHNIQUE: Multidetector CT imaging of the chest, abdomen and pelvis was performed following the standard protocol without IV contrast. RADIATION DOSE REDUCTION: This exam was performed according to the departmental dose-optimization program which includes automated exposure control, adjustment of the mA and/or kV according to patient size and/or use of iterative reconstruction technique. COMPARISON:  04/19/2022, chest x-ray from earlier in the same day. FINDINGS: CT CHEST FINDINGS Cardiovascular: Limited due to lack of IV contrast. Mild atherosclerotic calcifications are noted. No cardiac enlargement is seen. Dialysis catheter is noted from the left jugular approach. Mediastinum/Nodes: Thoracic inlet is within normal limits. No hilar or mediastinal adenopathy is noted. The esophagus is within normal limits. Lungs/Pleura: Lungs demonstrate diffuse emphysematous changes bilaterally. Thick walled cavitary lesion is noted in the left upper lobe measuring approximately  6.5 cm in greatest dimension. This is roughly stable to that seen on the prior exam. Node parenchymal nodules are seen. No other focal infiltrate is noted. Musculoskeletal: No acute rib abnormality is noted. No compression deformities are seen. CT ABDOMEN PELVIS FINDINGS Hepatobiliary: No focal liver abnormality is seen. Status post cholecystectomy. No biliary dilatation. Pancreas: Pancreas is well visualized with a few fluid attenuation areas adjacent to the pancreas. One is near the junction of the head and body superiorly best seen on image number 54 of series 2. This measures approximately 5.3 cm. A second fluid attenuation lesion measuring 7.3 x 5.1 cm is noted along the body of the pancreas. These changes are consistent with pseudocyst formation. The degree of inflammatory change seen previously has improved significantly. No definitive acute on chronic pancreatitis is noted at this time. Spleen: Normal in size without focal abnormality. Adrenals/Urinary Tract: Adrenal glands are within normal limits. Kidneys demonstrate no renal calculi or obstructive changes. No ureteral stones are seen. The bladder is partially distended. Stomach/Bowel: Considerable fecal material is noted throughout the colon consistent with a degree of constipation. No obstructive changes are seen. The appendix is within normal limits. Vascular/Lymphatic: Aortic atherosclerosis. No enlarged abdominal or pelvic lymph nodes. Reproductive: Prostate is unremarkable. Other: No abdominal wall hernia or abnormality. No abdominopelvic ascites. Musculoskeletal: Bilateral hip replacements are noted. Degenerative changes of lumbar spine are seen. IMPRESSION: CT of the chest: Stable appearing thick walled cavitary lesion in the left upper lobe. No new focal abnormality is noted. CT of the abdomen and pelvis: Two fluid attenuation lesion adjacent to the pancreas consistent with pseudocyst formation. Significant improvement in the degree of  inflammatory change of the pancreas is noted when compared with the prior study. Gallbladder has been surgically removed. No definitive biliary ductal stones are seen. Changes consistent with colonic constipation. Electronically Signed   By: Inez Catalina M.D.   On: 05/27/2022 23:57   DG Abd Acute W/Chest  Result Date: 05/27/2022 CLINICAL DATA:  Constipation and abdominal. EXAM: DG ABDOMEN ACUTE WITH 1 VIEW CHEST COMPARISON:  Chest radiograph 05/08/2022. Chest abdomen pelvis CT 04/19/2022. FINDINGS: Left internal jugular dialysis catheter unchanged in position. Improved lung volumes from prior exam. Cavitary process in the anterior left hemithorax is only faintly visualized by  radiograph, grossly stable. Left basilar atelectasis. Heart is normal in size. No acute airspace disease. No free intra-abdominal air. No bowel dilatation to suggest obstruction. There is a moderate volume of stool throughout the colon. Mild rectal distention with stool. Cholecystectomy clips in the right upper quadrant. Bilateral hip arthroplasties. IMPRESSION: 1. Moderate colonic stool burden with mild rectal distention with stool. No bowel obstruction or free air. 2. Improved lung volumes from prior exam with left basilar atelectasis. Cavitary process in the left lung is only faintly visualized by radiograph. Electronically Signed   By: Keith Rake M.D.   On: 05/27/2022 22:37    EKG: I independently viewed the EKG done and my findings are as followed: EKG was not done in the ED  Assessment/Plan Present on Admission:  Constipation  Leukocytosis  Principal Problem:   Constipation Active Problems:   Leukocytosis   Nausea & vomiting   End stage renal disease on dialysis (HCC)   Anemia of chronic disease   Pancreatic pseudocyst   Elevated lipase   Hyponatremia   Type 2 diabetes mellitus (HCC)   Hypoalbuminemia due to protein-calorie malnutrition (HCC)  Constipation Enema was given in the ED Continue to monitor for  bowel movement and consider GoLytely if enema is not successful  Nausea and vomiting Continue Zofran as needed  Pseudocyst of pancreas Elevated lipase level Lipase 251, continue Dilaudid as needed CT of abdomen and pelvis shows significant improvement in  degree of inflammatory change of the pancreas  Anemia of chronic disease H/H 7.1/23.3, this was 8.3/27.2 on 05/14/2022 No obvious source of acute blood loss This may be due to patient's kidney status Patient may require blood transfusion during dialysis  Leukocytosis possibly reactive WBC 14.4, this has been elevated since last 3 weeks and may be due to stress demargination in the setting of acute pancreatitis/shock  Hyponatremia Na 131, this may be corrected in dialysis  Hypoalbuminemia possibly secondary to mild protein calorie malnutrition Albumin 3.3, protein supplement will be provided  Type 2 diabetes mellitus Hemoglobin A1c was 12.5 on 04/10/2022 Continue Levemir 5 units twice daily Continue ISS and hypoglycemic protocol  ESRD on HD (MWF) Continue Renvela, Renal-vit Nephrology will be consulted for maintenance dialysis  DVT prophylaxis: Heparin subcu  Code Status: Full code  Consults: Nephrology  Family Communication: None at bedside  Severity of Illness: The appropriate patient status for this patient is INPATIENT. Inpatient status is judged to be reasonable and necessary in order to provide the required intensity of service to ensure the patient's safety. The patient's presenting symptoms, physical exam findings, and initial radiographic and laboratory data in the context of their chronic comorbidities is felt to place them at high risk for further clinical deterioration. Furthermore, it is not anticipated that the patient will be medically stable for discharge from the hospital within 2 midnights of admission.   * I certify that at the point of admission it is my clinical judgment that the patient will require  inpatient hospital care spanning beyond 2 midnights from the point of admission due to high intensity of service, high risk for further deterioration and high frequency of surveillance required.*  Author: Bernadette Hoit, DO 05/28/2022 8:08 AM  For on call review [email or web address].

## 2022-05-28 NOTE — Telephone Encounter (Signed)
[facility] patient, needs hospital follow up in 3-4 weeks. Usually sees Roseanne Kaufman, NP. Please arrange with her and if no availability then with me or any other APP.   Venetia Night, MSN, APRN, FNP-BC, AGACNP-BC [facility]

## 2022-05-28 NOTE — Consult Note (Signed)
Gastroenterology Consult   Referring Provider: No ref. provider found Primary Care Physician:  [name], MD Primary Gastroenterologist:  Dr. Abbey Chatters  Patient ID: Mathew Lara; [id number]; 08/11/1961   Admit date: 05/27/2022  LOS: 0 days   Date of Consultation: 05/28/2022  Reason for Consultation:  pancreatic pseudocyst  History of Present Illness   Mathew Lara is a 61 y.o. year old male with history of COPD, type 2 diabetes, HTN, chronic diastolic heart failure, hepatitis C status posttreatment with [name] and documented eradication, mixed HLD, alcohol abuse, ESRD on dialysis, and anxiety who presented to the ED with several days of abdominal pain, nausea, no bowel movement in 6 days.  Patient was recently discharged on 9/8 from [facility] after being in the hospital for 1 month.  ED course: Tachycardia, vital signs otherwise stable Labs: Hemoglobin 7.1, WBC 14.4 (appears to be chronic), sodium 131, BUN 32, creatinine 9.7, albumin 3.3, normal LFTs, lipase 251 (previously 128 on 8/8).  CT chest with stable appearing thick-walled cavitary lesion in the left upper lobe. CT A/P: two fluid attenuation lesions adjacent to the pancreas consistent with pseudocyst formation and improvement in the degree of inflammatory change of the pancreas, no definitive biliary ductal stones noted.  Changes consistent with colonic constipation  Consult: Reported he was having some abdominal pain, nausea, and vomiting yesterday. Reported it had been almost a week since his last bowel movement. He does report chronic pain and chronic opioid therapy. He states he had 4 bowel movements after the enema in the ED last night. He states that he had another small bowel movement earlier today and just received another enema prior to going down to dialysis. He denies any melena or BRBPR since receiving enemas. He denies any recent fever, chills, shortness of breath. He reports he tolerated some cooked carrots  earlier without any difficulty.    Prior imaging studies: CT A/P 04/10/2022: Acute pancreatitis: Diffuse pancreatic edema and peripancreatic fat stranding and fluid, no early signs of pseudocyst, pancreatic duct, biliary ducts unremarkable. s/p cholecystectomy  Right upper quadrant ultrasound 04/10/2022: Surgically absent gallbladder, CBD 10.8 mm (previously 3 mm on 02/26/2022), mildly increased liver echogenicity, normal portal vein Doppler  Abdominal ultrasound 04/21/2022: Gallbladder surgically absent, CBD measuring 7 mm (previously noted to be 10 mm)  CT C/A/P 04/19/2022: Increased fluid surrounding pancreas compared with 8/5 CT, pancreatic edema but no evidence of pancreatic necrosis.  CT A/P 05/27/2022: Fluid attenuating areas adjacent to the pancreas, 5.3 cm collection at the junction of the head and body, 7.3 x 5.1 cm lesion along the body of the pancreas.  Changes reported to be consistent with pseudocyst formation.  Inflammatory changes are improved significantly from prior CT images.  No definitive acute on chronic pancreatitis.  Gallbladder surgically absent, no definitive biliary ductal stones.  Changes consistent with chronic constipation.    Past Medical History:  Diagnosis Date   Alcohol abuse    Anxiety    Bronchitis    Chronic hip pain    CKD (chronic kidney disease) stage 3, GFR 30-59 ml/min (HCC)    COPD (chronic obstructive pulmonary disease) ([place])    DKA (diabetic ketoacidosis) ([place]) 04/10/2022   Essential hypertension    Hepatitis C    Mixed hyperlipidemia    Sleep apnea     Past Surgical History:  Procedure Laterality Date   BALLOON DILATION N/A 07/06/2021   Procedure: BALLOON DILATION;  Surgeon: [name], DO;  [facility]: [facility];  Service:  Endoscopy;  Laterality: N/A;   BIOPSY  07/06/2021   Procedure: BIOPSY;  Surgeon: [name], DO;  Location: [facility];  Service: Endoscopy;;   CATARACT EXTRACTION Right    CHOLECYSTECTOMY N/A 03/23/2021    Procedure: LAPAROSCOPIC CHOLECYSTECTOMY;  Surgeon: [name], MD;  Location: [facility];  Service: General;  Laterality: N/A;   COLONOSCOPY N/A 01/23/2014   SLF:The LEFT COLON IS EXTREMELY redundant/TWO RECTAL POLYPS REMOVED/SMALL VOLUME RECTAL BLEEDING MOST LIKELY DUE TO Small internal hemorrhoids. path with prolapse type polyp of benign-mucosa. no adenomas   COLONOSCOPY WITH PROPOFOL N/A 07/06/2021   non-bleeding internal hemorrhoids, one 8 mm polyp in sigmod s/p removal and clip placed. One 5 mm polyp at recto-sigmoid s/p removal. 3 year surveillance. Tubular adenomas   ESOPHAGOGASTRODUODENOSCOPY N/A 01/23/2014   EPP:IRJJOACZ ring at the gastroesophageal junction/Medium sized hiatal hernia/NDYSPEPSIA MOST LIKELY DUE TO GERD/MILD Non-erosive gastritis   ESOPHAGOGASTRODUODENOSCOPY N/A 04/18/2018   food impaction, small hiatal hernia   ESOPHAGOGASTRODUODENOSCOPY (EGD) WITH PROPOFOL N/A 07/06/2021   moderate Schatzki ring s/p dilation, gastritis s/p biopsy, normal duodenum. Negative H.pylori.   HEMORRHOID BANDING  01/23/2014   Procedure: HEMORRHOID BANDING;  Surgeon: Danie Binder, MD;  [facility];  Service: Endoscopy;;   IR FLUORO GUIDE CV LINE LEFT  05/04/2022   IR FLUORO GUIDE CV LINE LEFT  05/13/2022   IR US GUIDE VASC ACCESS LEFT  05/04/2022   JOINT REPLACEMENT Right 2017   LIVER BIOPSY N/A 03/23/2021   Procedure: LAPAROSCOPIC LIVER BIOPSY;  Surgeon: [name], MD;  Location: [facility];  Service: General;  Laterality: N/A;   MOUTH SURGERY     POLYPECTOMY  07/06/2021   Procedure: POLYPECTOMY;  Surgeon: [name], DO;  [facility];  Service: Endoscopy;;   TOTAL HIP ARTHROPLASTY Right 06/04/2016   Procedure: RIGHT TOTAL HIP ARTHROPLASTY ANTERIOR APPROACH;  Surgeon: Mcarthur Rossetti, MD;  Location: [facility];  Service: Orthopedics;  Laterality: Right;   TOTAL HIP ARTHROPLASTY Left 03/02/2019   Procedure: LEFT TOTAL HIP ARTHROPLASTY ANTERIOR  APPROACH;  Surgeon: Mcarthur Rossetti, MD;  Location: [facility];  Service: Orthopedics;  Laterality: Left;    Prior to Admission medications   Medication Sig Start Date End Date Taking? Authorizing Provider  albuterol (PROVENTIL) (2.5 MG/3ML) 0.083% nebulizer solution Take 3 mLs (2.5 mg total) by nebulization every 4 (four) hours as needed for wheezing or shortness of breath. 05/14/22  Yes Elgergawy, Silver Huguenin, MD  arformoterol (BROVANA) 15 MCG/2ML NEBU Take 2 mLs (15 mcg total) by nebulization 2 (two) times daily. 05/14/22  Yes Elgergawy, Silver Huguenin, MD  aspirin EC 81 MG tablet Take 81 mg by mouth daily.   Yes [provider]  atorvastatin (LIPITOR) 80 MG tablet Take 1 tablet (80 mg total) by mouth daily. Patient taking differently: Take 80 mg by mouth at bedtime. 05/14/22  Yes Elgergawy, Silver Huguenin, MD  calcium carbonate (TUMS - DOSED IN MG ELEMENTAL CALCIUM) 500 MG chewable tablet Chew 1 tablet (200 mg of elemental calcium total) by mouth every 8 (eight) hours as needed for indigestion or heartburn. 05/14/22  Yes Elgergawy, Silver Huguenin, MD  ferrous fumarate (FERRETTS) 325 (106 Fe) MG TABS tablet Take 1 tablet by mouth daily.   Yes [provider]  folic acid-vitamin b complex-vitamin c-selenium-zinc (DIALYVITE) 3 MG TABS tablet Take 1 tablet by mouth daily.   Yes [provider]  insulin aspart (NOVOLOG) 100 UNIT/ML injection Inject 1-3 Units into the skin 3 (  three) times daily with meals. Patient taking differently: Inject 2-12 Units into the skin See admin instructions. Inject per sliding scale: If 201-250=2 units 251-300=4 units 301-350=6 units 351-400=8 units 401-450=10 units 451-500=12 units If reads high, give 14 units, recheck in 2 hours. Call MD if blood sugar is >400 or <10 05/14/22  Yes Elgergawy, Silver Huguenin, MD  insulin detemir (LEVEMIR) 100 UNIT/ML injection Inject 0.25 mLs (25 Units total) into the skin 2 (two) times daily. 05/14/22  Yes Elgergawy, Silver Huguenin, MD   loratadine (CLARITIN) 10 MG tablet Take 1 tablet (10 mg total) by mouth daily. 09/02/19  Yes Barton Dubois, MD  midodrine (PROAMATINE) 10 MG tablet Take 2 tablets (20 mg total) by mouth every 8 (eight) hours. Patient taking differently: Take 20 mg by mouth 3 (three) times daily. 05/14/22  Yes Elgergawy, Silver Huguenin, MD  Multiple Vitamin (MULTIVITAMIN) tablet Take 1 tablet by mouth daily.   Yes [provider]  oxyCODONE HCl 15 MG TABA Take 15 mg by mouth 2 (two) times daily.   Yes [provider]  pantoprazole (PROTONIX) 40 MG tablet Take 1 tablet (40 mg total) by mouth 2 (two) times daily before a meal. 30 minutes before breakfast 03/08/22 09/04/22 Yes Annitta Needs, NP  polyethylene glycol (MIRALAX / GLYCOLAX) 17 g packet Take 17 g by mouth daily as needed for mild constipation. 05/14/22  Yes Elgergawy, Silver Huguenin, MD  QUEtiapine (SEROQUEL) 25 MG tablet Take 1 tablet (25 mg total) by mouth at bedtime. 05/14/22  Yes Elgergawy, Silver Huguenin, MD  revefenacin (YUPELRI) 175 MCG/3ML nebulizer solution Take 3 mLs (175 mcg total) by nebulization daily. Patient taking differently: Inhale 175 mcg into the lungs daily. 05/15/22  Yes Elgergawy, Silver Huguenin, MD  sevelamer carbonate (RENVELA) 800 MG tablet Take 3 tablets (2,400 mg total) by mouth 3 (three) times daily with meals. 05/14/22  Yes Elgergawy, Silver Huguenin, MD  simethicone (MYLICON) 80 MG chewable tablet Chew 1 tablet (80 mg total) by mouth 4 (four) times daily. Patient taking differently: Chew 80 mg by mouth every 4 (four) hours as needed for flatulence. 05/14/22  Yes Elgergawy, Silver Huguenin, MD  sodium chloride (OCEAN) 0.65 % SOLN nasal spray Place 1 spray into both nostrils as needed for congestion. Patient taking differently: Place 1 spray into both nostrils every 2 (two) hours as needed for congestion. 05/14/22  Yes Elgergawy, Silver Huguenin, MD  multivitamin (RENA-VIT) TABS tablet Take 1 tablet by mouth at bedtime. Patient not taking: Reported on 05/28/2022 05/14/22    Elgergawy, Silver Huguenin, MD    Current Facility-Administered Medications  Medication Dose Route Frequency Provider Last Rate Last Admin   Chlorhexidine Gluconate Cloth 2 % PADS 6 each  6 each Topical Q0600 Gean Quint, MD       feeding supplement (GLUCERNA SHAKE) (GLUCERNA SHAKE) liquid 237 mL  237 mL Oral TID BM Adefeso, Oladapo, DO       [START ON 05/29/2022] influenza vac split quadrivalent PF (FLUARIX) injection 0.5 mL  0.5 mL Intramuscular Tomorrow-1000 Emokpae, Courage, MD       insulin aspart (novoLOG) injection 0-6 Units  0-6 Units Subcutaneous TID WC Adefeso, Oladapo, DO       insulin detemir (LEVEMIR) injection 5 Units  5 Units Subcutaneous BID Adefeso, Oladapo, DO       polyethylene glycol (MIRALAX / GLYCOLAX) packet 17 g  17 g Oral BID Emokpae, Courage, MD   17 g at 05/28/22 0911   senna-docusate (Senokot-S) tablet 2 tablet  2 tablet Oral QHS Roxan Hockey, MD       Current Outpatient Medications  Medication Sig Dispense Refill   albuterol (PROVENTIL) (2.5 MG/3ML) 0.083% nebulizer solution Take 3 mLs (2.5 mg total) by nebulization every 4 (four) hours as needed for wheezing or shortness of breath. 75 mL 12   arformoterol (BROVANA) 15 MCG/2ML NEBU Take 2 mLs (15 mcg total) by nebulization 2 (two) times daily. 120 mL    aspirin EC 81 MG tablet Take 81 mg by mouth daily.     atorvastatin (LIPITOR) 80 MG tablet Take 1 tablet (80 mg total) by mouth daily. (Patient taking differently: Take 80 mg by mouth at bedtime.)     calcium carbonate (TUMS - DOSED IN MG ELEMENTAL CALCIUM) 500 MG chewable tablet Chew 1 tablet (200 mg of elemental calcium total) by mouth every 8 (eight) hours as needed for indigestion or heartburn.     ferrous fumarate (FERRETTS) 325 (106 Fe) MG TABS tablet Take 1 tablet by mouth daily.     folic acid-vitamin b complex-vitamin c-selenium-zinc (DIALYVITE) 3 MG TABS tablet Take 1 tablet by mouth daily.     insulin aspart (NOVOLOG) 100 UNIT/ML injection Inject 1-3 Units  into the skin 3 (three) times daily with meals. (Patient taking differently: Inject 2-12 Units into the skin See admin instructions. Inject per sliding scale: If 201-250=2 units 251-300=4 units 301-350=6 units 351-400=8 units 401-450=10 units 451-500=12 units If reads high, give 14 units, recheck in 2 hours. Call MD if blood sugar is >400 or <10) 10 mL 11   insulin detemir (LEVEMIR) 100 UNIT/ML injection Inject 0.25 mLs (25 Units total) into the skin 2 (two) times daily. 10 mL 11   loratadine (CLARITIN) 10 MG tablet Take 1 tablet (10 mg total) by mouth daily. 30 tablet 1   midodrine (PROAMATINE) 10 MG tablet Take 2 tablets (20 mg total) by mouth every 8 (eight) hours. (Patient taking differently: Take 20 mg by mouth 3 (three) times daily.)     Multiple Vitamin (MULTIVITAMIN) tablet Take 1 tablet by mouth daily.     oxyCODONE HCl 15 MG TABA Take 15 mg by mouth 2 (two) times daily.     pantoprazole (PROTONIX) 40 MG tablet Take 1 tablet (40 mg total) by mouth 2 (two) times daily before a meal. 30 minutes before breakfast 60 tablet 5   polyethylene glycol (MIRALAX / GLYCOLAX) 17 g packet Take 17 g by mouth daily as needed for mild constipation. 14 each 0   QUEtiapine (SEROQUEL) 25 MG tablet Take 1 tablet (25 mg total) by mouth at bedtime.     revefenacin (YUPELRI) 175 MCG/3ML nebulizer solution Take 3 mLs (175 mcg total) by nebulization daily. (Patient taking differently: Inhale 175 mcg into the lungs daily.) 90 mL    sevelamer carbonate (RENVELA) 800 MG tablet Take 3 tablets (2,400 mg total) by mouth 3 (three) times daily with meals.     simethicone (MYLICON) 80 MG chewable tablet Chew 1 tablet (80 mg total) by mouth 4 (four) times daily. (Patient taking differently: Chew 80 mg by mouth every 4 (four) hours as needed for flatulence.) 30 tablet 0   sodium chloride (OCEAN) 0.65 % SOLN nasal spray Place 1 spray into both nostrils as needed for congestion. (Patient taking differently: Place 1 spray into  both nostrils every 2 (two) hours as needed for congestion.)  0   multivitamin (RENA-VIT) TABS tablet Take 1 tablet by mouth at bedtime. (Patient not taking: Reported on 05/28/2022)  0    Allergies as of 05/27/2022   (No Known Allergies)    Family History  Problem Relation Age of Onset   Cancer Mother    Colon cancer Neg Hx    Colon polyps Neg Hx     Social History   Socioeconomic History   Marital status: Single    Spouse name: Not on file   Number of children: 1   Years of education: 10   Highest education level: Not on file  Occupational History    Comment: [employer]  Tobacco Use   Smoking status: Former    Packs/day: 1.00    Years: 42.00    Total pack years: 42.00    Types: Cigarettes    Start date: 09/07/1971    Quit date: 05/28/2014    Years since quitting: 8.0    Passive exposure: Past   Smokeless tobacco: Never   Tobacco comments:    quit in November 2015 after smoking x 20 yrs.  Vaping Use   Vaping Use: Never used  Substance and Sexual Activity   Alcohol use: Not Currently    Alcohol/week: 0.0 standard drinks of alcohol   Drug use: No   Sexual activity: Not on file  Other Topics Concern   Not on file  Social History Narrative       patient consumes 4 cups of caffeine daily.      Patient stopped drinking years ago.   Social Determinants of Health   Financial Resource Strain: Not on file  Food Insecurity: No Food Insecurity (05/28/2022)   Hunger Vital Sign    Worried About Running Out of Food in the Last Year: Never true    Ran Out of Food in the Last Year: Never true  Transportation Needs: No Transportation Needs (05/28/2022)   PRAPARE - Hydrologist (Medical): No    Lack of Transportation (Non-Medical): No  Physical Activity: Not on file  Stress: Not on file  Social Connections: Not on file  Intimate Partner Violence: Unknown (05/28/2022)   Humiliation, Afraid, Rape, and Kick questionnaire    Fear of Current or  Ex-Partner: No    Emotionally Abused: No    Physically Abused: No    Sexually Abused: Not on file     Review of Systems   Gen: Denies any fever, chills, loss of appetite, change in weight or weight loss CV: Denies chest pain, heart palpitations, syncope, edema  Resp: Denies shortness of breath with rest, cough, wheezing, coughing up blood, and pleurisy. GI: see HPI. GU : Denies urinary burning, blood in urine, urinary frequency, and urinary incontinence. MS: Denies joint pain, limitation of movement, swelling, cramps, and atrophy.  Derm: Denies rash, itching, dry skin, hives. Psych: Denies depression, anxiety, memory loss, hallucinations, and confusion. Heme: Denies bruising or bleeding Neuro:  Denies any headaches, dizziness, paresthesias, shaking  Physical Exam   Vital Signs in last 24 hours: Temp:  [98.1 F (36.7 C)-98.4 F (36.9 C)] 98.4 F (36.9 C) (09/22 0645) Pulse Rate:  [95-113] 113 (09/22 1300) Resp:  [12-23] 18 (09/22 1300) BP: (107-145)/(68-101) 127/86 (09/22 0900) SpO2:  [91 %-100 %] 94 % (09/22 1300) Weight:  [950 kg] 115 kg (09/21 2114)    General:   Alert,  Well-developed, well-nourished, pleasant and cooperative in NAD Head:  Normocephalic and atraumatic. Eyes:  Sclera clear, no icterus.   Conjunctiva pink. Ears:  Normal auditory acuity. Lungs:  Clear throughout to auscultation, diminished in the bases.  No wheezes, crackles, or rhonchi. No acute distress. Heart:  Regular rate and rhythm; no murmurs, clicks, rubs,  or gallops. Abdomen:  Soft, mildly distended but non-tender. Normal bowel sounds, without guarding, and without rebound.   Rectal: deferred Msk:  Symmetrical without gross deformities. Normal posture. Extremities:  Without clubbing or edema. Neurologic:  Alert and  oriented x4. Skin:  Intact without significant lesions or rashes. Psych:  Alert and cooperative. Normal mood and affect.  Intake/Output from previous day: No intake/output data  recorded. Intake/Output this shift: No intake/output data recorded.  Labs/Studies   Recent Labs Recent Labs    05/27/22 2202  WBC 14.4*  HGB 7.1*  HCT 23.3*  PLT 242   BMET Recent Labs    05/27/22 2202  NA 131*  K 4.8  CL 89*  CO2 28  GLUCOSE 108*  BUN 32*  CREATININE 9.72*  CALCIUM 9.7   LFT Recent Labs    05/27/22 2202  PROT 8.4*  ALBUMIN 3.3*  AST 21  ALT 16  ALKPHOS 109  BILITOT 0.7   PT/INR No results for input(s): "LABPROT", "INR" in the last 72 hours. Hepatitis Panel No results for input(s): "HEPBSAG", "HCVAB", "HEPAIGM", "HEPBIGM" in the last 72 hours. C-Diff No results for input(s): "CDIFFTOX" in the last 72 hours.  Radiology/Studies CT CHEST ABDOMEN PELVIS WO CONTRAST  Result Date: 05/27/2022 CLINICAL DATA:  Abdominal pain and left basilar atelectasis EXAM: CT CHEST, ABDOMEN AND PELVIS WITHOUT CONTRAST TECHNIQUE: Multidetector CT imaging of the chest, abdomen and pelvis was performed following the standard protocol without IV contrast. RADIATION DOSE REDUCTION: This exam was performed according to the departmental dose-optimization program which includes automated exposure control, adjustment of the mA and/or kV according to patient size and/or use of iterative reconstruction technique. COMPARISON:  04/19/2022, chest x-ray from earlier in the same day. FINDINGS: CT CHEST FINDINGS Cardiovascular: Limited due to lack of IV contrast. Mild atherosclerotic calcifications are noted. No cardiac enlargement is seen. Dialysis catheter is noted from the left jugular approach. Mediastinum/Nodes: Thoracic inlet is within normal limits. No hilar or mediastinal adenopathy is noted. The esophagus is within normal limits. Lungs/Pleura: Lungs demonstrate diffuse emphysematous changes bilaterally. Thick walled cavitary lesion is noted in the left upper lobe measuring approximately 6.5 cm in greatest dimension. This is roughly stable to that seen on the prior exam. Node  parenchymal nodules are seen. No other focal infiltrate is noted. Musculoskeletal: No acute rib abnormality is noted. No compression deformities are seen. CT ABDOMEN PELVIS FINDINGS Hepatobiliary: No focal liver abnormality is seen. Status post cholecystectomy. No biliary dilatation. Pancreas: Pancreas is well visualized with a few fluid attenuation areas adjacent to the pancreas. One is near the junction of the head and body superiorly best seen on image number 54 of series 2. This measures approximately 5.3 cm. A second fluid attenuation lesion measuring 7.3 x 5.1 cm is noted along the body of the pancreas. These changes are consistent with pseudocyst formation. The degree of inflammatory change seen previously has improved significantly. No definitive acute on chronic pancreatitis is noted at this time. Spleen: Normal in size without focal abnormality. Adrenals/Urinary Tract: Adrenal glands are within normal limits. Kidneys demonstrate no renal calculi or obstructive changes. No ureteral stones are seen. The bladder is partially distended. Stomach/Bowel: Considerable fecal material is noted throughout the colon consistent with a degree of constipation. No obstructive changes are seen. The appendix is within normal limits. Vascular/Lymphatic: Aortic atherosclerosis. No enlarged abdominal or pelvic lymph nodes. Reproductive: Prostate  is unremarkable. Other: No abdominal wall hernia or abnormality. No abdominopelvic ascites. Musculoskeletal: Bilateral hip replacements are noted. Degenerative changes of lumbar spine are seen. IMPRESSION: CT of the chest: Stable appearing thick walled cavitary lesion in the left upper lobe. No new focal abnormality is noted. CT of the abdomen and pelvis: Two fluid attenuation lesion adjacent to the pancreas consistent with pseudocyst formation. Significant improvement in the degree of inflammatory change of the pancreas is noted when compared with the prior study. Gallbladder has been  surgically removed. No definitive biliary ductal stones are seen. Changes consistent with colonic constipation. Electronically Signed   By: Inez Catalina M.D.   On: 05/27/2022 23:57   DG Abd Acute W/Chest  Result Date: 05/27/2022 CLINICAL DATA:  Constipation and abdominal. EXAM: DG ABDOMEN ACUTE WITH 1 VIEW CHEST COMPARISON:  Chest radiograph 05/08/2022. Chest abdomen pelvis CT 04/19/2022. FINDINGS: Left internal jugular dialysis catheter unchanged in position. Improved lung volumes from prior exam. Cavitary process in the anterior left hemithorax is only faintly visualized by radiograph, grossly stable. Left basilar atelectasis. Heart is normal in size. No acute airspace disease. No free intra-abdominal air. No bowel dilatation to suggest obstruction. There is a moderate volume of stool throughout the colon. Mild rectal distention with stool. Cholecystectomy clips in the right upper quadrant. Bilateral hip arthroplasties. IMPRESSION: 1. Moderate colonic stool burden with mild rectal distention with stool. No bowel obstruction or free air. 2. Improved lung volumes from prior exam with left basilar atelectasis. Cavitary process in the left lung is only faintly visualized by radiograph. Electronically Signed   By: Keith Rake M.D.   On: 05/27/2022 22:37     Assessment   Mathew Lara is a 61 y.o. year old male with history of COPD, type 2 diabetes, HTN, chronic diastolic heart failure, hepatitis C status posttreatment with [name] and documented eradication, mixed HLD, alcohol abuse, ESRD on dialysis, and anxiety who presented to the ED with several days of abdominal pain, nausea, no bowel movement in 6 days.  Patient was recently discharged on 9/8 from [facility] after being in the hospital for 1 month.  GI consulted for further evaluation and management of pancreatic pseudocyst.  Pancreatic pseudocyst/Chronic pancreatitis: Recent admission at [facility] 04/10/2022 - 05/14/2022 at which time he admitted  for DKA and was seen by GI regarding acute pancreatitis thought to be secondary to hypertriglyceridemia and probable gallstone.  Lipase was elevated to 2291.  Prior imaging as outlined in HPI.  He is s/p cholecystectomy and liver biopsy on 03/23/2022.  Original ultrasound 04/10/2022 noted CBD dilation to 10 mm.  Follow-up ultrasound on 04/21/2022 with improvement of CBD to 7 mm, felt to be in normal range given cholecystectomy.  Previously did not have evidence of necrosis or pseudocyst, lipase trended down.  He was originally scheduled for an MRCP, however patient was too unstable at the time.  He presented to the ED with abdominal pain, nausea, no bowel movement in 6 days.  Lipase previously 128 on 8/8.  Lipase on admission 251.  Repeat CT A/P with significant improvement in pancreatic edema/inflammatory change but with development of 2 pseudocyst, 1 at the head and body junction, and other at the body of the pancreas. Collections are a moderate size but he does not have any abdominal pain or constitutional symptoms. His leukocytosis is chronic based on chart review. Continue to monitor and obtain repeat imaging in 8-12 weeks.   History of Hepatitis C: Treated with [name] with SVR achieved.  LFTs normal on admission.  Liver biopsy 03/23/2022 with chronic hepatitis with minimal grade 1 activity, stage III bridging fibrosis consistent with history of hepatitis C.  No evidence of cirrhosis.  Plan / Recommendations   Continue supportive measures Agree with aggressive bowel regimen with MiraLAX BID and Senokot nightly Advance diet as tolerated. Would benefit from high fiber diet.  Follow up CT with pancreatic protocol in 8-12 weeks.  Hospital follow up in 3-4 weeks.      05/28/2022, 1:50 PM  Venetia Night, MSN, FNP-BC, AGACNP-BC [facility]

## 2022-05-28 NOTE — ED Notes (Signed)
Admission RN at [facility]

## 2022-05-28 NOTE — Progress Notes (Signed)
Auth started on Nav[employer] portal.  First name is spelled [facility]  with [facility].

## 2022-05-28 NOTE — ED Notes (Signed)
Admitting at [facility]

## 2022-05-28 NOTE — Consult Note (Addendum)
ESRD Consult Note   Assessment/Recommendations:   Dialysis dependent AKI:  -Initially started CRRT on 04/13/2022, started HD on 8/21 via [facility] -Outpatient orders: [place].  MWF.  4 hours.  2K/2 calcium.  400/600.  EDW 115 kg. -will plan for HD on MWF schedule if census allows -will TPA catheter first, if catheter is dysfunctional then will need a new catheter  Constipation/n/v -per primary  Hyponatremia, mild -will manage with HD, 137Na bath  Volume/ hypertension: EDW 115kg. Will UF as tolerated  Anemia of Chronic Kidney Disease: Hemoglobin 7.1. Due to start mircera 50mg, has not received this. Will give a dose  Secondary Hyperparathyroidism/Hyperphosphatemia: resume 3 tabs TIDAC binders if able to take PO, renal diet. Chk PO4.   Recommendations were discussed with the primary team.  We will be available over the weekend if needed. Please call covering nephrologist with any questions/concerns.  History of Present Illness: Mathew Lara a/an 61y.o. male with a past medical history of HD dependent AKI, history of CKD 3B-4, hypertension, DM 2, COPD, chronic diastolic CHF, hepatitis C, hyperlipidemia, history of alcohol abuse who presents with abdominal pain.  Had not had a bowel movement about 6 days prior to admission.  Apparently his catheter clotted when he went for dialysis on Wednesday 9/20.  Was admitted and August for pancreatitis, multilobar pneumonia, alcohol abuse, sepsis and had severe AKI/ATN which prompted him to be on CRRT which was started on 8/8, transitioned to HD on 8/21. Patient seen and examined in ER. He does endorse hunger, he feels as if his abd is slightly better. He reports that he feels like he made more urine with bowel movements now. He otherwise denies any fevers, chills, chest pain, SOB, swelling.   Medications:  Current Facility-Administered Medications  Medication Dose Route Frequency Provider Last Rate Last Admin   feeding supplement  (GLUCERNA SHAKE) (GLUCERNA SHAKE) liquid 237 mL  237 mL Oral TID BM Adefeso, Oladapo, DO       insulin aspart (novoLOG) injection 0-6 Units  0-6 Units Subcutaneous TID WC Adefeso, Oladapo, DO       insulin detemir (LEVEMIR) injection 5 Units  5 Units Subcutaneous BID Adefeso, Oladapo, DO       lactulose (CHRONULAC) 10 GM/15ML solution 60 g  60 g Oral Once Emokpae, Courage, MD       polyethylene glycol (MIRALAX / GLYCOLAX) packet 17 g  17 g Oral BID Emokpae, Courage, MD       senna-docusate (Senokot-S) tablet 2 tablet  2 tablet Oral QHS Emokpae, Courage, MD       Current Outpatient Medications  Medication Sig Dispense Refill   albuterol (PROVENTIL) (2.5 MG/3ML) 0.083% nebulizer solution Take 3 mLs (2.5 mg total) by nebulization every 4 (four) hours as needed for wheezing or shortness of breath. 75 mL 12   arformoterol (BROVANA) 15 MCG/2ML NEBU Take 2 mLs (15 mcg total) by nebulization 2 (two) times daily. 120 mL    aspirin EC 81 MG tablet Take 81 mg by mouth daily.     atorvastatin (LIPITOR) 80 MG tablet Take 1 tablet (80 mg total) by mouth daily.     calcium carbonate (TUMS - DOSED IN MG ELEMENTAL CALCIUM) 500 MG chewable tablet Chew 1 tablet (200 mg of elemental calcium total) by mouth every 8 (eight) hours as needed for indigestion or heartburn.     insulin aspart (NOVOLOG) 100 UNIT/ML injection Inject 1-3 Units into the skin 3 (three) times daily with meals. 10 mL  11   insulin detemir (LEVEMIR) 100 UNIT/ML injection Inject 0.25 mLs (25 Units total) into the skin 2 (two) times daily. 10 mL 11   loratadine (CLARITIN) 10 MG tablet Take 1 tablet (10 mg total) by mouth daily. 30 tablet 1   midodrine (PROAMATINE) 10 MG tablet Take 2 tablets (20 mg total) by mouth every 8 (eight) hours.     multivitamin (RENA-VIT) TABS tablet Take 1 tablet by mouth at bedtime.  0   pantoprazole (PROTONIX) 40 MG tablet Take 1 tablet (40 mg total) by mouth 2 (two) times daily before a meal. 30 minutes before breakfast  60 tablet 5   polyethylene glycol (MIRALAX / GLYCOLAX) 17 g packet Take 17 g by mouth daily as needed for mild constipation. 14 each 0   QUEtiapine (SEROQUEL) 25 MG tablet Take 1 tablet (25 mg total) by mouth at bedtime.     revefenacin (YUPELRI) 175 MCG/3ML nebulizer solution Take 3 mLs (175 mcg total) by nebulization daily. 90 mL    sevelamer carbonate (RENVELA) 800 MG tablet Take 3 tablets (2,400 mg total) by mouth 3 (three) times daily with meals.     simethicone (MYLICON) 80 MG chewable tablet Chew 1 tablet (80 mg total) by mouth 4 (four) times daily. 30 tablet 0   sodium chloride (OCEAN) 0.65 % SOLN nasal spray Place 1 spray into both nostrils as needed for congestion.  0     ALLERGIES Patient has no known allergies.  MEDICAL HISTORY Past Medical History:  Diagnosis Date   Alcohol abuse    Anxiety    Bronchitis    Chronic hip pain    CKD (chronic kidney disease) stage 3, GFR 30-59 ml/min (HCC)    COPD (chronic obstructive pulmonary disease) (HCC)    DKA (diabetic ketoacidosis) ([place]) 04/10/2022   Essential hypertension    Hepatitis C    Mixed hyperlipidemia    Sleep apnea      SOCIAL HISTORY Social History   Socioeconomic History   Marital status: Single    Spouse name: Not on file   Number of children: 1   Years of education: 10   Highest education level: Not on file  Occupational History    Comment: [employer]  Tobacco Use   Smoking status: Former    Packs/day: 1.00    Years: 42.00    Total pack years: 42.00    Types: Cigarettes    Start date: 09/07/1971    Quit date: 05/28/2014    Years since quitting: 8.0    Passive exposure: Past   Smokeless tobacco: Never   Tobacco comments:    quit in November 2015 after smoking x 20 yrs.  Vaping Use   Vaping Use: Never used  Substance and Sexual Activity   Alcohol use: Not Currently    Alcohol/week: 0.0 standard drinks of alcohol   Drug use: No   Sexual activity: Not on file  Other Topics Concern   Not on file   Social History Narrative       patient consumes 4 cups of caffeine daily.      Patient stopped drinking years ago.   Social Determinants of Health   Financial Resource Strain: Not on file  Food Insecurity: Not on file  Transportation Needs: Not on file  Physical Activity: Not on file  Stress: Not on file  Social Connections: Not on file  Intimate Partner Violence: Not on file     FAMILY HISTORY Family History  Problem Relation Age of  Onset   Cancer Mother    Colon cancer Neg Hx    Colon polyps Neg Hx      Review of Systems: 12 systems were reviewed and negative except per HPI  Physical Exam: Vitals:   05/28/22 0745 05/28/22 0800  BP:  113/77  Pulse: (!) 102 97  Resp: 14 15  Temp:    SpO2: 97% 91%   No intake/output data recorded. No intake or output data in the 24 hours ending 05/28/22 0844 General: well-appearing, no acute distress HEENT: anicteric sclera, MMM CV: normal rate, no murmurs, no edema Lungs: bilateral chest rise, normal wob Abd: soft, non-tender, distended+obese Skin: no visible lesions or rashes Neuro: normal speech, no gross focal deficits  D[facility] access: [facility] c/d/i  Test Results Reviewed Lab Results  Component Value Date   NA 131 (L) 05/27/2022   K 4.8 05/27/2022   CL 89 (L) 05/27/2022   CO2 28 05/27/2022   BUN 32 (H) 05/27/2022   CREATININE 9.72 (H) 05/27/2022   CALCIUM 9.7 05/27/2022   ALBUMIN 3.3 (L) 05/27/2022   PHOS 8.6 (H) 05/14/2022    I have reviewed relevant outside healthcare records

## 2022-05-29 DIAGNOSIS — D638 Anemia in other chronic diseases classified elsewhere: Secondary | ICD-10-CM | POA: Diagnosis not present

## 2022-05-29 DIAGNOSIS — K5900 Constipation, unspecified: Secondary | ICD-10-CM | POA: Diagnosis not present

## 2022-05-29 DIAGNOSIS — N186 End stage renal disease: Secondary | ICD-10-CM | POA: Diagnosis not present

## 2022-05-29 DIAGNOSIS — R748 Abnormal levels of other serum enzymes: Secondary | ICD-10-CM | POA: Diagnosis not present

## 2022-05-29 LAB — URINALYSIS, ROUTINE W REFLEX MICROSCOPIC
Bilirubin Urine: NEGATIVE
Glucose, UA: NEGATIVE mg/dL
Ketones, ur: NEGATIVE mg/dL
Nitrite: NEGATIVE
Protein, ur: >=300 mg/dL — AB
Specific Gravity, Urine: 1.018 (ref 1.005–1.030)
WBC, UA: >50 WBC/hpf — ABNORMAL HIGH (ref 0–5)
pH: 5 (ref 5.0–8.0)

## 2022-05-29 LAB — COMPREHENSIVE METABOLIC PANEL
ALT: 15 U/L (ref 0–44)
AST: 21 U/L (ref 15–41)
Albumin: 3 g/dL — ABNORMAL LOW (ref 3.5–5.0)
Alkaline Phosphatase: 103 U/L (ref 38–126)
Anion gap: 13 (ref 5–15)
BUN: 20 mg/dL (ref 8–23)
CO2: 27 mmol/L (ref 22–32)
Calcium: 9.2 mg/dL (ref 8.9–10.3)
Chloride: 94 mmol/L — ABNORMAL LOW (ref 98–111)
Creatinine, Ser: 6.65 mg/dL — ABNORMAL HIGH (ref 0.61–1.24)
GFR, Estimated: 9 mL/min — ABNORMAL LOW (ref >60)
Glucose, Bld: 118 mg/dL — ABNORMAL HIGH (ref 70–99)
Potassium: 3.9 mmol/L (ref 3.5–5.1)
Sodium: 134 mmol/L — ABNORMAL LOW (ref 135–145)
Total Bilirubin: 0.7 mg/dL (ref 0.3–1.2)
Total Protein: 7.8 g/dL (ref 6.5–8.1)

## 2022-05-29 LAB — CBC
HCT: 24.7 % — ABNORMAL LOW (ref 39.0–52.0)
Hemoglobin: 7.5 g/dL — ABNORMAL LOW (ref 13.0–17.0)
MCH: 27.1 pg (ref 26.0–34.0)
MCHC: 30.4 g/dL (ref 30.0–36.0)
MCV: 89.2 fL (ref 80.0–100.0)
Platelets: 275 10*3/uL (ref 150–400)
RBC: 2.77 MIL/uL — ABNORMAL LOW (ref 4.22–5.81)
RDW: 18 % — ABNORMAL HIGH (ref 11.5–15.5)
WBC: 13.7 10*3/uL — ABNORMAL HIGH (ref 4.0–10.5)
nRBC: 0 % (ref 0.0–0.2)

## 2022-05-29 LAB — GLUCOSE, CAPILLARY
Glucose-Capillary: 109 mg/dL — ABNORMAL HIGH (ref 70–99)
Glucose-Capillary: 132 mg/dL — ABNORMAL HIGH (ref 70–99)
Glucose-Capillary: 127 mg/dL — ABNORMAL HIGH (ref 70–99)
Glucose-Capillary: 129 mg/dL — ABNORMAL HIGH (ref 70–99)

## 2022-05-29 LAB — HEPATITIS B SURFACE ANTIBODY, QUANTITATIVE: Hep B S AB Quant (Post): 334.9 m[IU]/mL (ref >9.9)

## 2022-05-29 LAB — APTT: aPTT: 42 seconds — ABNORMAL HIGH (ref 24–36)

## 2022-05-29 MED ORDER — MUSCLE RUB 10-15 % EX CREA
TOPICAL_CREAM | CUTANEOUS | Status: DC | PRN
  Filled 2022-05-29: qty 85

## 2022-05-29 MED ORDER — LACTULOSE 10 GM/15ML PO SOLN
30.0000 g | Freq: Once | ORAL | Status: AC
  Administered 2022-05-29: 30 g via ORAL
  Filled 2022-05-29: qty 60

## 2022-05-29 MED ORDER — MIDODRINE HCL 5 MG PO TABS: 5.0000 mg | ORAL_TABLET | Freq: Three times a day (TID) | ORAL | Status: DC

## 2022-05-29 MED ORDER — BISACODYL 10 MG RE SUPP
10.0000 mg | Freq: Once | RECTAL | Status: AC
  Administered 2022-05-29: 10 mg via RECTAL
  Filled 2022-05-29: qty 1

## 2022-05-29 MED ORDER — MIDODRINE HCL 5 MG PO TABS
10.0000 mg | ORAL_TABLET | Freq: Three times a day (TID) | ORAL | Status: DC
  Administered 2022-05-29 – 2022-06-01 (×10): 10 mg via ORAL
  Filled 2022-05-29 (×10): qty 2

## 2022-05-29 NOTE — Plan of Care (Signed)
  Problem: Coping: Goal: Ability to adjust to condition or change in health will improve Outcome: Progressing   

## 2022-05-29 NOTE — Progress Notes (Signed)
PROGRESS NOTE     Mathew Lara, is a 61 y.o. male, DOB - May 14, 1961, PNT:[id number]  Admit date - 05/27/2022   Admitting Physician Bernadette Hoit, DO  Outpatient Primary MD for the patient is [name], MD  LOS - 1  Chief Complaint  Patient presents with   Abdominal Pain        Brief Narrative:  61 y.o. male with medical history significant of COPD, T2DM, HLD, chronic diastolic CHF, hepatitis C, mixed hyperlipidemia, alcohol abuse (several months ago), CKD, and anxiety disorder recently treated for pancreatitis with opiates subsequently developed severe constipation/obstipation with abdominal pain and nausea -Admitted on 05/28/2022 for same    -Assessment and Plan: A/p 1) severe constipation/obstipation-----imaging studies noted -Abdominal pain and nausea persist -No BM in about 1 week prior to admission -Now having BMs with  lactulose, MiraLAX and Senokot-S -Schedule Dulcolax nightly -Be judicious with opiates   2) acute alcoholic pancreatitis----lipase up to 251 -Abdominal pain and nausea persist -Repeat imaging studies suggest pseudocyst formation -Discussed with Dr Gala Romney , recommends Outpatient follow-up with gastroenterology PA---Ms Roseanne Kaufman postdischarge advised -Continue Protonix -Advance to full liquid diet   3)ESRD--nephrology consult for HD on Mondays Wednesdays and Fridays   4)Leukocytosis---??  Reactive in the setting of obstipation and pseudocyst formation /pancreatitis -Recently patient has had persistent leukocytosis--- may need outpatient follow-up with oncology postdischarge   5)DM2-A1c 12.5 reflecting uncontrolled diabetes with persistent hyperglycemia -Continue Levemir insulin Use Novolog/Humalog Sliding scale insulin with Accu-Cheks/Fingersticks as ordered    6) anemia of chronic kidney disease--patient appears to have acute on chronic anemia at this time with hemoglobin drifting down -EPO/ESA agent per nephrology team -Hgb 7.5... Baseline  hemoglobin usually around 8 -No overt bleeding noted   7) hyponatremia--- the setting of nausea and poor oral intake -Continue to use hemodialysis to address volume status -Avoid dehydration  8)Hypotension--- restart midodrine  Disposition/Need for in-Hospital Stay- patient unable to be discharged at this time due to -obstipation requiring aggressive laxatives.... Worsening hypotension with GI losses with laxatives... Hemodialysis patient need to be judicious with IV fluids... -Possible discharge in 24 to 48 hours if hemodynamically more stable after further bowel movements  Status is: Inpatient   Disposition: The patient is from: SNF              Anticipated d/c is to: SNF              Anticipated d/c date is: 1 day              Patient currently is not medically stable to d/c. Barriers: Not Clinically Stable-   Code Status :  -  Code Status: Full Code   Family Communication:    NA (patient is alert, awake and coherent)   DVT Prophylaxis  :   - SCDs   heparin injection 5,000 Units Start: 05/28/22 1500 SCDs Start: 05/28/22 1406   Lab Results  Component Value Date   PLT 275 05/29/2022   Inpatient Medications  Scheduled Meds:  arformoterol  15 mcg Nebulization BID   atorvastatin  80 mg Oral Daily   bisacodyl  10 mg Rectal Once   Chlorhexidine Gluconate Cloth  6 each Topical Q0600   feeding supplement (GLUCERNA SHAKE)  237 mL Oral TID BM   heparin  5,000 Units Subcutaneous Q8H   insulin aspart  0-6 Units Subcutaneous TID WC   insulin detemir  5 Units Subcutaneous BID   lactulose  30 g Oral Once   multivitamin  1  tablet Oral QHS   polyethylene glycol  17 g Oral BID   senna-docusate  2 tablet Oral QHS   sevelamer carbonate  2,400 mg Oral TID WC   Continuous Infusions: PRN Meds:.acetaminophen **OR** acetaminophen, albuterol, heparin, Muscle Rub, ondansetron **OR** ondansetron (ZOFRAN) IV, zolpidem   Anti-infectives (From admission, onward)    None        Subjective: Mathew Lara today has no fevers, no emesis,  No chest pain,   -Soft blood pressure noted -Had multiple BMs with laxatives -Abdominal pain is not worse  Objective: Vitals:   05/29/22 0504 05/29/22 0741 05/29/22 0959 05/29/22 1146  BP: 95/73  (!) 81/65 93/66  Pulse: (!) 119  (!) 116 (!) 111  Resp:   20 20  Temp: 98.2 F (36.8 C)     TempSrc: Oral     SpO2: 95% 93% 100% 98%  Weight:      Height:        Intake/Output Summary (Last 24 hours) at 05/29/2022 1148 Last data filed at 05/29/2022 0100 Gross per 24 hour  Intake 340 ml  Output 1900 ml  Net -1560 ml   Filed Weights   05/28/22 1409 05/28/22 1552 05/28/22 2000  Weight: 116.3 kg 116.3 kg 114.4 kg    Physical Exam  Gen:- Awake Alert,  in no apparent distress  HEENT:- Noank.AT, No sclera icterus Neck-Supple Neck, Lt IJ HD catheter Lungs-  CTAB , fair symmetrical air movement CV- S1, S2 normal, regular  Abd-  +ve B.Sounds, Abd Soft, No tenderness,    Extremity/Skin:- No  edema, pedal pulses present  Psych-affect is appropriate, oriented x3 Neuro-no new focal deficits, no tremors  Data Reviewed: I have personally reviewed following labs and imaging studies  CBC: Recent Labs  Lab 05/27/22 2202 05/29/22 0500  WBC 14.4* 13.7*  NEUTROABS 11.5*  --   HGB 7.1* 7.5*  HCT 23.3* 24.7*  MCV 89.3 89.2  PLT 242 846   Basic Metabolic Panel: Recent Labs  Lab 05/27/22 2202 05/28/22 1406 05/29/22 0500  NA 131*  --  134*  K 4.8  --  3.9  CL 89*  --  94*  CO2 28  --  27  GLUCOSE 108*  --  118*  BUN 32*  --  20  CREATININE 9.72*  --  6.65*  CALCIUM 9.7  --  9.2  MG  --  2.4  --   PHOS  --  4.5  --    GFR: Estimated Creatinine Clearance: 14.1 mL/min (A) (by C-G formula based on SCr of 6.65 mg/dL (H)). Liver Function Tests: Recent Labs  Lab 05/27/22 2202 05/29/22 0500  AST 21 21  ALT 16 15  ALKPHOS 109 103  BILITOT 0.7 0.7  PROT 8.4* 7.8  ALBUMIN 3.3* 3.0*   Radiology Studies: CT CHEST  ABDOMEN PELVIS WO CONTRAST  Result Date: 05/27/2022 CLINICAL DATA:  Abdominal pain and left basilar atelectasis EXAM: CT CHEST, ABDOMEN AND PELVIS WITHOUT CONTRAST TECHNIQUE: Multidetector CT imaging of the chest, abdomen and pelvis was performed following the standard protocol without IV contrast. RADIATION DOSE REDUCTION: This exam was performed according to the departmental dose-optimization program which includes automated exposure control, adjustment of the mA and/or kV according to patient size and/or use of iterative reconstruction technique. COMPARISON:  04/19/2022, chest x-ray from earlier in the same day. FINDINGS: CT CHEST FINDINGS Cardiovascular: Limited due to lack of IV contrast. Mild atherosclerotic calcifications are noted. No cardiac enlargement is seen. Dialysis catheter is noted from the  left jugular approach. Mediastinum/Nodes: Thoracic inlet is within normal limits. No hilar or mediastinal adenopathy is noted. The esophagus is within normal limits. Lungs/Pleura: Lungs demonstrate diffuse emphysematous changes bilaterally. Thick walled cavitary lesion is noted in the left upper lobe measuring approximately 6.5 cm in greatest dimension. This is roughly stable to that seen on the prior exam. Node parenchymal nodules are seen. No other focal infiltrate is noted. Musculoskeletal: No acute rib abnormality is noted. No compression deformities are seen. CT ABDOMEN PELVIS FINDINGS Hepatobiliary: No focal liver abnormality is seen. Status post cholecystectomy. No biliary dilatation. Pancreas: Pancreas is well visualized with a few fluid attenuation areas adjacent to the pancreas. One is near the junction of the head and body superiorly best seen on image number 54 of series 2. This measures approximately 5.3 cm. A second fluid attenuation lesion measuring 7.3 x 5.1 cm is noted along the body of the pancreas. These changes are consistent with pseudocyst formation. The degree of inflammatory change seen  previously has improved significantly. No definitive acute on chronic pancreatitis is noted at this time. Spleen: Normal in size without focal abnormality. Adrenals/Urinary Tract: Adrenal glands are within normal limits. Kidneys demonstrate no renal calculi or obstructive changes. No ureteral stones are seen. The bladder is partially distended. Stomach/Bowel: Considerable fecal material is noted throughout the colon consistent with a degree of constipation. No obstructive changes are seen. The appendix is within normal limits. Vascular/Lymphatic: Aortic atherosclerosis. No enlarged abdominal or pelvic lymph nodes. Reproductive: Prostate is unremarkable. Other: No abdominal wall hernia or abnormality. No abdominopelvic ascites. Musculoskeletal: Bilateral hip replacements are noted. Degenerative changes of lumbar spine are seen. IMPRESSION: CT of the chest: Stable appearing thick walled cavitary lesion in the left upper lobe. No new focal abnormality is noted. CT of the abdomen and pelvis: Two fluid attenuation lesion adjacent to the pancreas consistent with pseudocyst formation. Significant improvement in the degree of inflammatory change of the pancreas is noted when compared with the prior study. Gallbladder has been surgically removed. No definitive biliary ductal stones are seen. Changes consistent with colonic constipation. Electronically Signed   By: Inez Catalina M.D.   On: 05/27/2022 23:57   DG Abd Acute W/Chest  Result Date: 05/27/2022 CLINICAL DATA:  Constipation and abdominal. EXAM: DG ABDOMEN ACUTE WITH 1 VIEW CHEST COMPARISON:  Chest radiograph 05/08/2022. Chest abdomen pelvis CT 04/19/2022. FINDINGS: Left internal jugular dialysis catheter unchanged in position. Improved lung volumes from prior exam. Cavitary process in the anterior left hemithorax is only faintly visualized by radiograph, grossly stable. Left basilar atelectasis. Heart is normal in size. No acute airspace disease. No free  intra-abdominal air. No bowel dilatation to suggest obstruction. There is a moderate volume of stool throughout the colon. Mild rectal distention with stool. Cholecystectomy clips in the right upper quadrant. Bilateral hip arthroplasties. IMPRESSION: 1. Moderate colonic stool burden with mild rectal distention with stool. No bowel obstruction or free air. 2. Improved lung volumes from prior exam with left basilar atelectasis. Cavitary process in the left lung is only faintly visualized by radiograph. Electronically Signed   By: Keith Rake M.D.   On: 05/27/2022 22:37    Scheduled Meds:  arformoterol  15 mcg Nebulization BID   atorvastatin  80 mg Oral Daily   bisacodyl  10 mg Rectal Once   Chlorhexidine Gluconate Cloth  6 each Topical Q0600   feeding supplement (GLUCERNA SHAKE)  237 mL Oral TID BM   heparin  5,000 Units Subcutaneous Q8H   insulin  aspart  0-6 Units Subcutaneous TID WC   insulin detemir  5 Units Subcutaneous BID   lactulose  30 g Oral Once   multivitamin  1 tablet Oral QHS   polyethylene glycol  17 g Oral BID   senna-docusate  2 tablet Oral QHS   sevelamer carbonate  2,400 mg Oral TID WC   Continuous Infusions:   LOS: 1 day   Roxan Hockey M.D on 05/29/2022 at 11:48 AM  Go to [email or web address] - for contact info  [facility] - Office  [phone]  If 7PM-7AM, please contact night-coverage [email or web address] 05/29/2022, 11:48 AM

## 2022-05-30 DIAGNOSIS — R748 Abnormal levels of other serum enzymes: Secondary | ICD-10-CM | POA: Diagnosis not present

## 2022-05-30 DIAGNOSIS — K5900 Constipation, unspecified: Secondary | ICD-10-CM | POA: Diagnosis not present

## 2022-05-30 DIAGNOSIS — N186 End stage renal disease: Secondary | ICD-10-CM | POA: Diagnosis not present

## 2022-05-30 DIAGNOSIS — E8809 Other disorders of plasma-protein metabolism, not elsewhere classified: Secondary | ICD-10-CM | POA: Diagnosis not present

## 2022-05-30 LAB — CBC
HCT: 22.6 % — ABNORMAL LOW (ref 39.0–52.0)
Hemoglobin: 6.7 g/dL — CL (ref 13.0–17.0)
MCH: 26.7 pg (ref 26.0–34.0)
MCHC: 29.6 g/dL — ABNORMAL LOW (ref 30.0–36.0)
MCV: 90 fL (ref 80.0–100.0)
Platelets: 260 10*3/uL (ref 150–400)
RBC: 2.51 MIL/uL — ABNORMAL LOW (ref 4.22–5.81)
RDW: 17.8 % — ABNORMAL HIGH (ref 11.5–15.5)
WBC: 10.8 10*3/uL — ABNORMAL HIGH (ref 4.0–10.5)
nRBC: 0 % (ref 0.0–0.2)

## 2022-05-30 LAB — BASIC METABOLIC PANEL
Anion gap: 13 (ref 5–15)
BUN: 24 mg/dL — ABNORMAL HIGH (ref 8–23)
CO2: 28 mmol/L (ref 22–32)
Calcium: 9.3 mg/dL (ref 8.9–10.3)
Chloride: 94 mmol/L — ABNORMAL LOW (ref 98–111)
Creatinine, Ser: 8.6 mg/dL — ABNORMAL HIGH (ref 0.61–1.24)
GFR, Estimated: 6 mL/min — ABNORMAL LOW (ref >60)
Glucose, Bld: 98 mg/dL (ref 70–99)
Potassium: 3.7 mmol/L (ref 3.5–5.1)
Sodium: 135 mmol/L (ref 135–145)

## 2022-05-30 LAB — GLUCOSE, CAPILLARY
Glucose-Capillary: 96 mg/dL (ref 70–99)
Glucose-Capillary: 96 mg/dL (ref 70–99)
Glucose-Capillary: 103 mg/dL — ABNORMAL HIGH (ref 70–99)
Glucose-Capillary: 111 mg/dL — ABNORMAL HIGH (ref 70–99)
Glucose-Capillary: 110 mg/dL — ABNORMAL HIGH (ref 70–99)

## 2022-05-30 LAB — PREPARE RBC (CROSSMATCH)

## 2022-05-30 MED ORDER — SODIUM CHLORIDE 0.9 % IV SOLN
1.0000 g | INTRAVENOUS | Status: DC
  Administered 2022-05-30 – 2022-05-31 (×2): 1 g via INTRAVENOUS
  Filled 2022-05-30 (×3): qty 10

## 2022-05-30 MED ORDER — CHLORHEXIDINE GLUCONATE CLOTH 2 % EX PADS
6.0000 | MEDICATED_PAD | Freq: Every day | CUTANEOUS | Status: DC
  Administered 2022-05-30 – 2022-06-01 (×3): 6 via TOPICAL

## 2022-05-30 MED ORDER — SODIUM CHLORIDE 0.9% IV SOLUTION: Freq: Once | INTRAVENOUS | Status: AC

## 2022-05-30 MED ORDER — HYDROCORTISONE 1 % EX CREA: TOPICAL_CREAM | Freq: Four times a day (QID) | CUTANEOUS | Status: DC | PRN

## 2022-05-30 MED ORDER — DARBEPOETIN ALFA 200 MCG/0.4ML IJ SOSY
200.0000 ug | PREFILLED_SYRINGE | Freq: Once | INTRAMUSCULAR | Status: AC
  Administered 2022-05-30: 200 ug via INTRAVENOUS
  Filled 2022-05-30: qty 0.4

## 2022-05-30 NOTE — Progress Notes (Signed)
PROGRESS NOTE     Mathew Lara, is a 61 y.o. male, DOB - 10/08/1960, QVZ:[id number]  Admit date - 05/27/2022   Admitting Physician Bernadette Hoit, DO  Outpatient Primary MD for the patient is [name], MD  LOS - 2  Chief Complaint  Patient presents with   Abdominal Pain        Brief Narrative:  61 y.o. male with medical history significant of COPD, T2DM, HLD, chronic diastolic CHF, hepatitis C, mixed hyperlipidemia, alcohol abuse (several months ago), CKD, and anxiety disorder recently treated for pancreatitis with opiates subsequently developed severe constipation/obstipation with abdominal pain and nausea -Admitted on 05/28/2022 for same    -Assessment and Plan: A/p 1) severe constipation/obstipation-----imaging studies noted -Abdominal pain and nausea persist -No BM in about 1 week prior to admission -Now having BMs with  lactulose, MiraLAX and Senokot-S and dulcolax -Be judicious with opiates   2) acute alcoholic pancreatitis----lipase up to 251 -Abdominal pain and nausea persist -Repeat imaging studies suggest pseudocyst formation -Discussed with Dr Gala Romney , recommends Outpatient follow-up with gastroenterology PA---Ms Roseanne Kaufman postdischarge advised -Continue Protonix -Advance to full liquid diet   3)ESRD--nephrology consult for HD on Mondays Wednesdays and Fridays   4)Leukocytosis---??  Reactive in the setting of obstipation and pseudocyst formation /pancreatitis -Recently patient has had persistent leukocytosis--- may need outpatient follow-up with oncology postdischarge   5)DM2-A1c 12.5 reflecting uncontrolled diabetes with persistent hyperglycemia -Continue Levemir insulin Use Novolog/Humalog Sliding scale insulin with Accu-Cheks/Fingersticks as ordered    6) acute on chronic anemia of chronic kidney disease--patient appears to have acute on chronic anemia at this time with hemoglobin drifting down -EPO/ESA agent per nephrology team -Hgb 6.7 ...  Baseline hemoglobin usually around 8 -No overt bleeding noted -Given 1 unit of PRBC on 05/30/2022  May  give additional unit of PRBC on 05/31/2022 with hemodialysis if needed   7) hyponatremia--- the setting of nausea and poor oral intake -Continue to use hemodialysis to address volume status -Avoid dehydration  8)Hypotension--- restarted midodrine  9)Generalized Weakness and deconditioning--- awaiting physical therapy eval  Disposition/Need for in-Hospital Stay- patient unable to be discharged at this time due to -obstipation requiring aggressive laxatives.... Worsening hypotension with GI losses with laxatives... Hemodialysis patient need to be judicious with IV fluids...   Status is: Inpatient   Disposition: The patient is from: SNF              Anticipated d/c is to: SNF              Anticipated d/c date is: 1 day              Patient currently is not medically stable to d/c. Barriers: Not Clinically Stable-   Code Status :  -  Code Status: Full Code   Family Communication:    NA (patient is alert, awake and coherent)   DVT Prophylaxis  :   - SCDs   heparin injection 5,000 Units Start: 05/28/22 1500 SCDs Start: 05/28/22 1406   Lab Results  Component Value Date   PLT 260 05/30/2022   Inpatient Medications  Scheduled Meds:  arformoterol  15 mcg Nebulization BID   atorvastatin  80 mg Oral Daily   Chlorhexidine Gluconate Cloth  6 each Topical Q0600   Chlorhexidine Gluconate Cloth  6 each Topical Q0600   feeding supplement (GLUCERNA SHAKE)  237 mL Oral TID BM   heparin  5,000 Units Subcutaneous Q8H   insulin aspart  0-6 Units Subcutaneous TID WC  insulin detemir  5 Units Subcutaneous BID   midodrine  10 mg Oral TID WC   multivitamin  1 tablet Oral QHS   polyethylene glycol  17 g Oral BID   senna-docusate  2 tablet Oral QHS   sevelamer carbonate  2,400 mg Oral TID WC   Continuous Infusions:  cefTRIAXone (ROCEPHIN)  IV 1 g (05/30/22 0919)   PRN Meds:.acetaminophen  **OR** acetaminophen, albuterol, heparin, Muscle Rub, ondansetron **OR** ondansetron (ZOFRAN) IV, zolpidem   Anti-infectives (From admission, onward)    Start     Dose/Rate Route Frequency Ordered Stop   05/30/22 0900  cefTRIAXone (ROCEPHIN) 1 g in sodium chloride 0.9 % 100 mL IVPB        1 g 200 mL/hr over 30 Minutes Intravenous Every 24 hours 05/30/22 0800         Subjective: Laveda Norman today has no fevers, no emesis,  No chest pain,   -Oral intake is fair -BMs improved  Objective: Vitals:   05/30/22 0929 05/30/22 1556 05/30/22 1639 05/30/22 1640  BP:  (!) 81/56 (!) 85/52 (!) 85/52  Pulse:  92 92 92  Resp:  '20 20 20  '$ Temp:  98.8 F (37.1 C) 97.9 F (36.6 C) 97.9 F (36.6 C)  TempSrc:  Oral  Oral  SpO2: 95% 96%  95%  Weight:      Height:        Intake/Output Summary (Last 24 hours) at 05/30/2022 1856 Last data filed at 05/30/2022 1600 Gross per 24 hour  Intake 283.47 ml  Output 2 ml  Net 281.47 ml   Filed Weights   05/28/22 1409 05/28/22 1552 05/28/22 2000  Weight: 116.3 kg 116.3 kg 114.4 kg    Physical Exam  Gen:- Awake Alert,  in no apparent distress  HEENT:- [place].AT, No sclera icterus Neck-Supple Neck, Lt IJ HD catheter Lungs-  CTAB , fair symmetrical air movement CV- S1, S2 normal, regular  Abd-  +ve B.Sounds, Abd Soft, No tenderness,    Extremity/Skin:- No  edema, pedal pulses present  Psych-affect is appropriate, oriented x3 Neuro-generalized weakness, no new focal deficits, no tremors  Data Reviewed: I have personally reviewed following labs and imaging studies  CBC: Recent Labs  Lab 05/27/22 2202 05/29/22 0500 05/30/22 0812  WBC 14.4* 13.7* 10.8*  NEUTROABS 11.5*  --   --   HGB 7.1* 7.5* 6.7*  HCT 23.3* 24.7* 22.6*  MCV 89.3 89.2 90.0  PLT 242 275 341   Basic Metabolic Panel: Recent Labs  Lab 05/27/22 2202 05/28/22 1406 05/29/22 0500 05/30/22 0812  NA 131*  --  134* 135  K 4.8  --  3.9 3.7  CL 89*  --  94* 94*  CO2 28  --  27  28  GLUCOSE 108*  --  118* 98  BUN 32*  --  20 24*  CREATININE 9.72*  --  6.65* 8.60*  CALCIUM 9.7  --  9.2 9.3  MG  --  2.4  --   --   PHOS  --  4.5  --   --    GFR: Estimated Creatinine Clearance: 10.9 mL/min (A) (by C-G formula based on SCr of 8.6 mg/dL (H)). Liver Function Tests: Recent Labs  Lab 05/27/22 2202 05/29/22 0500  AST 21 21  ALT 16 15  ALKPHOS 109 103  BILITOT 0.7 0.7  PROT 8.4* 7.8  ALBUMIN 3.3* 3.0*   Radiology Studies: No results found.  Scheduled Meds:  arformoterol  15 mcg Nebulization BID  atorvastatin  80 mg Oral Daily   Chlorhexidine Gluconate Cloth  6 each Topical Q0600   Chlorhexidine Gluconate Cloth  6 each Topical Q0600   feeding supplement (GLUCERNA SHAKE)  237 mL Oral TID BM   heparin  5,000 Units Subcutaneous Q8H   insulin aspart  0-6 Units Subcutaneous TID WC   insulin detemir  5 Units Subcutaneous BID   midodrine  10 mg Oral TID WC   multivitamin  1 tablet Oral QHS   polyethylene glycol  17 g Oral BID   senna-docusate  2 tablet Oral QHS   sevelamer carbonate  2,400 mg Oral TID WC   Continuous Infusions:  cefTRIAXone (ROCEPHIN)  IV 1 g (05/30/22 0919)     LOS: 2 days   Roxan Hockey M.D on 05/30/2022 at 6:56 PM  Go to [email or web address] - for contact info  [facility] - Office  [phone]  If 7PM-7AM, please contact night-coverage [email or web address] 05/30/2022, 6:56 PM

## 2022-05-30 NOTE — Progress Notes (Signed)
Blood transfusion complete at 1938. No suspected reaction. Lung sounds clear. VS WDL. Bryson Corona Edd Fabian

## 2022-05-30 NOTE — Progress Notes (Signed)
Date and time results received: 05/30/22 0 854 (use smartphrase ".now" to insert current time)  Test: CBC Critical Value: HGB 6.7  Name of Provider Notified: Dr. Joesph Fillers  Orders Received? Or Actions Taken?: Awaiting new orders at this time

## 2022-05-30 NOTE — Progress Notes (Signed)
Patient blood consent signed and placed in patients medical record. Witness by this Probation officer

## 2022-05-31 DIAGNOSIS — D638 Anemia in other chronic diseases classified elsewhere: Secondary | ICD-10-CM | POA: Diagnosis not present

## 2022-05-31 DIAGNOSIS — K5900 Constipation, unspecified: Secondary | ICD-10-CM | POA: Diagnosis not present

## 2022-05-31 DIAGNOSIS — K863 Pseudocyst of pancreas: Secondary | ICD-10-CM | POA: Diagnosis not present

## 2022-05-31 DIAGNOSIS — N186 End stage renal disease: Secondary | ICD-10-CM | POA: Diagnosis not present

## 2022-05-31 LAB — CBC
HCT: 24.4 % — ABNORMAL LOW (ref 39.0–52.0)
Hemoglobin: 7.5 g/dL — ABNORMAL LOW (ref 13.0–17.0)
MCH: 27.3 pg (ref 26.0–34.0)
MCHC: 30.7 g/dL (ref 30.0–36.0)
MCV: 88.7 fL (ref 80.0–100.0)
Platelets: 262 10*3/uL (ref 150–400)
RBC: 2.75 MIL/uL — ABNORMAL LOW (ref 4.22–5.81)
RDW: 17.2 % — ABNORMAL HIGH (ref 11.5–15.5)
WBC: 11.6 10*3/uL — ABNORMAL HIGH (ref 4.0–10.5)
nRBC: 0 % (ref 0.0–0.2)

## 2022-05-31 LAB — BASIC METABOLIC PANEL
Anion gap: 15 (ref 5–15)
BUN: 31 mg/dL — ABNORMAL HIGH (ref 8–23)
CO2: 26 mmol/L (ref 22–32)
Calcium: 8.9 mg/dL (ref 8.9–10.3)
Chloride: 92 mmol/L — ABNORMAL LOW (ref 98–111)
Creatinine, Ser: 9.46 mg/dL — ABNORMAL HIGH (ref 0.61–1.24)
GFR, Estimated: 6 mL/min — ABNORMAL LOW (ref >60)
Glucose, Bld: 98 mg/dL (ref 70–99)
Potassium: 3.8 mmol/L (ref 3.5–5.1)
Sodium: 133 mmol/L — ABNORMAL LOW (ref 135–145)

## 2022-05-31 LAB — PREPARE RBC (CROSSMATCH)

## 2022-05-31 LAB — URINE CULTURE: Culture: NO GROWTH

## 2022-05-31 LAB — GLUCOSE, CAPILLARY
Glucose-Capillary: 104 mg/dL — ABNORMAL HIGH (ref 70–99)
Glucose-Capillary: 172 mg/dL — ABNORMAL HIGH (ref 70–99)
Glucose-Capillary: 108 mg/dL — ABNORMAL HIGH (ref 70–99)
Glucose-Capillary: 101 mg/dL — ABNORMAL HIGH (ref 70–99)

## 2022-05-31 MED ORDER — SODIUM CHLORIDE 0.9% IV SOLUTION: Freq: Once | INTRAVENOUS | Status: AC

## 2022-05-31 MED ORDER — ORAL CARE MOUTH RINSE: 15.0000 mL | OROMUCOSAL | Status: DC | PRN

## 2022-05-31 MED ORDER — ALTEPLASE 2 MG IJ SOLR: 4.0000 mg | Freq: Once | INTRAMUSCULAR | Status: AC

## 2022-05-31 MED ORDER — ALTEPLASE 2 MG IJ SOLR
INTRAMUSCULAR | Status: AC
  Administered 2022-05-31: 4 mg
  Filled 2022-05-31: qty 4

## 2022-05-31 NOTE — Progress Notes (Signed)
Patient ID: Mathew Lara, male   DOB: [birth year], 61 y.o.   MRN: [id number] S: Feeling better today.  Eating breakfast. O:BP 101/81 (BP Location: Right Arm)   Pulse (!) 105   Temp (!) 97.1 F (36.2 C)   Resp 16   Ht '5\' 8"'$  (1.727 m)   Wt 114.4 kg   SpO2 95%   BMI 38.35 kg/m   Intake/Output Summary (Last 24 hours) at 05/31/2022 0853 Last data filed at 05/31/2022 0645 Gross per 24 hour  Intake 699.47 ml  Output 50 ml  Net 649.47 ml   Intake/Output: I/O last 3 completed shifts: In: 939.5 [P.O.:480; I.V.:50.7; Blood:376; IV Piggyback:32.8] Out: 52 [Urine:50; Stool:2]  Intake/Output this shift:  No intake/output data recorded. Weight change:  Gen: NAD QBV:QXIHW  Resp: CTA Abd: +BS, soft, NT/ND Ext: no edema  Recent Labs  Lab 05/27/22 2202 05/28/22 1406 05/29/22 0500 05/30/22 0812 05/31/22 0749  NA 131*  --  134* 135 133*  K 4.8  --  3.9 3.7 3.8  CL 89*  --  94* 94* 92*  CO2 28  --  '27 28 26  '$ GLUCOSE 108*  --  118* 98 98  BUN 32*  --  20 24* 31*  CREATININE 9.72*  --  6.65* 8.60* 9.46*  ALBUMIN 3.3*  --  3.0*  --   --   CALCIUM 9.7  --  9.2 9.3 8.9  PHOS  --  4.5  --   --   --   AST 21  --  21  --   --   ALT 16  --  15  --   --    Liver Function Tests: Recent Labs  Lab 05/27/22 2202 05/29/22 0500  AST 21 21  ALT 16 15  ALKPHOS 109 103  BILITOT 0.7 0.7  PROT 8.4* 7.8  ALBUMIN 3.3* 3.0*   Recent Labs  Lab 05/27/22 2202  LIPASE 251*   No results for input(s): "AMMONIA" in the last 168 hours. CBC: Recent Labs  Lab 05/27/22 2202 05/29/22 0500 05/30/22 0812 05/31/22 0749  WBC 14.4* 13.7* 10.8* 11.6*  NEUTROABS 11.5*  --   --   --   HGB 7.1* 7.5* 6.7* 7.5*  HCT 23.3* 24.7* 22.6* 24.4*  MCV 89.3 89.2 90.0 88.7  PLT 242 275 260 262   Cardiac Enzymes: No results for input(s): "CKTOTAL", "CKMB", "CKMBINDEX", "TROPONINI" in the last 168 hours. CBG: Recent Labs  Lab 05/30/22 0850 05/30/22 1235 05/30/22 1633 05/30/22 2123 05/31/22 0811  GLUCAP  96 103* 111* 110* 104*    Iron Studies: No results for input(s): "IRON", "TIBC", "TRANSFERRIN", "FERRITIN" in the last 72 hours. Studies/Results: No results found.  sodium chloride   Intravenous Once   arformoterol  15 mcg Nebulization BID   atorvastatin  80 mg Oral Daily   Chlorhexidine Gluconate Cloth  6 each Topical Q0600   Chlorhexidine Gluconate Cloth  6 each Topical Q0600   feeding supplement (GLUCERNA SHAKE)  237 mL Oral TID BM   heparin  5,000 Units Subcutaneous Q8H   insulin aspart  0-6 Units Subcutaneous TID WC   insulin detemir  5 Units Subcutaneous BID   midodrine  10 mg Oral TID WC   multivitamin  1 tablet Oral QHS   polyethylene glycol  17 g Oral BID   senna-docusate  2 tablet Oral QHS   sevelamer carbonate  2,400 mg Oral TID WC    BMET    Component Value Date/Time   NA  133 (L) 05/31/2022 0749   NA 134 03/16/2022 1050   K 3.8 05/31/2022 0749   CL 92 (L) 05/31/2022 0749   CO2 26 05/31/2022 0749   GLUCOSE 98 05/31/2022 0749   BUN 31 (H) 05/31/2022 0749   BUN 16 03/16/2022 1050   CREATININE 9.46 (H) 05/31/2022 0749   CALCIUM 8.9 05/31/2022 0749   GFRNONAA 6 (L) 05/31/2022 0749   GFRAA >60 11/26/2019 1316   CBC    Component Value Date/Time   WBC 11.6 (H) 05/31/2022 0749   RBC 2.75 (L) 05/31/2022 0749   HGB 7.5 (L) 05/31/2022 0749   HGB 14.0 03/16/2022 1050   HCT 24.4 (L) 05/31/2022 0749   HCT 42.4 03/16/2022 1050   PLT 262 05/31/2022 0749   PLT 258 03/16/2022 1050   MCV 88.7 05/31/2022 0749   MCV 80 03/16/2022 1050   MCH 27.3 05/31/2022 0749   MCHC 30.7 05/31/2022 0749   RDW 17.2 (H) 05/31/2022 0749   RDW 16.5 (H) 03/16/2022 1050   LYMPHSABS 1.1 05/27/2022 2202   LYMPHSABS 1.0 03/16/2022 1050   MONOABS 1.3 (H) 05/27/2022 2202   EOSABS 0.4 05/27/2022 2202   EOSABS 0.4 03/16/2022 1050   BASOSABS 0.0 05/27/2022 2202   BASOSABS 0.0 03/16/2022 1050   Dialysis dependent AKI:  -Initially started CRRT on 04/13/2022, started HD on 8/21 via  [facility] -Outpatient orders: [place].  MWF.  4 hours.  2K/2 calcium.  400/600.  EDW 115 kg. -will plan for HD on MWF schedule if census allows -will TPA catheter first, if catheter is dysfunctional then will need a new catheter  Assessment/Plan:  ABLA/Anemia of CKD - s/p transfusion of 1 unit yesterday and plan for another today with HD.  No evidence of overt bleeding. Constipation/obstipation - improving with lactulose, miralax and stool softeners AKI-HD dependent - for HD today and follow.  Acute alcoholic pancreatitis -  denies abdominal pain.  F/u with GI Hyponatremia - mild and asymptomatic.  Follow after HD.  Disposition - hopeful discharge to SNF soon.   Donetta Potts, MD [employer] [phone]

## 2022-05-31 NOTE — Evaluation (Signed)
Physical Therapy Evaluation Patient Details Name: Mathew Lara MRN: [id number] DOB: November 03, 1960 Today's Date: 05/31/2022  History of Present Illness  Mathew Lara is a 61 y.o. male with medical history significant of COPD, T2DM, essential hypertension, chronic diastolic CHF, hepatitis C, mixed hyperlipidemia, alcohol abuse, CKD, anxiety who presents to the emergency department due to several days of onset of abdominal pain.  Patient states that he has not had any bowel movement in 6 days, he complained of distended abdomen and nausea.  She was unable to provide detailed history, some of the history was obtained from ED physician and ED medical record.  Patient's catheter was noted to be clotted when he went for dialysis on Wednesday 9/20 and it was not certain if he got a full session of HD   Clinical Impression  Patient demonstrates good return for ambulation in room and hallways without loss of balance. Patient's SpO2 at 98% on room air with SpO2 dropping to 87% upon exertion. Oxygen levels returned to 98% with patient being put back on 2 LPM of oxygen while resting at EOB following ambulation. Patient left seated EOB with call bell in reach - RN notified. Plan: patient discharged from physical therapy to care of nursing for ambulation daily as tolerated for length of stay.       Recommendations for follow up therapy are one component of a multi-disciplinary discharge planning process, led by the attending physician.  Recommendations may be updated based on patient status, additional functional criteria and insurance authorization.  Follow Up Recommendations No PT follow up Can patient physically be transported by private vehicle: Yes    Assistance Recommended at Discharge Set up Supervision/Assistance  Patient can return home with the following  Assistance with cooking/housework;Help with stairs or ramp for entrance;A little help with walking and/or transfers    Equipment  Recommendations None recommended by PT  Recommendations for Other Services       Functional Status Assessment Patient has had a recent decline in their functional status and demonstrates the ability to make significant improvements in function in a reasonable and predictable amount of time.     Precautions / Restrictions Precautions Precautions: Fall Restrictions Weight Bearing Restrictions: No      Mobility  Bed Mobility Overal bed mobility: Independent Bed Mobility: Sidelying to Sit   Sidelying to sit: Independent       General bed mobility comments: Patient demonstrated no difficulty and good pace for sidelying to sit transfer    Transfers Overall transfer level: Independent Equipment used: None Transfers: Sit to/from Stand, Bed to chair/wheelchair/BSC Sit to Stand: Independent           General transfer comment: good return from seated EOB to ambulating in hallway    Ambulation/Gait Ambulation/Gait assistance: Supervision Gait Distance (Feet): 70 Feet Assistive device: None Gait Pattern/deviations: Decreased stride length, Decreased step length - right, Decreased step length - left Gait velocity: Decreased     General Gait Details: Pt demonstrated good return for ambulation in room and hallway without loss of balance  Stairs            Wheelchair Mobility    Modified Rankin (Stroke Patients Only)       Balance Overall balance assessment: Modified Independent Sitting-balance support: No upper extremity supported, Feet supported Sitting balance-Leahy Scale: Good Sitting balance - Comments: good seated EOB   Standing balance support: No upper extremity supported Standing balance-Leahy Scale: Good Standing balance comment: good, no loss of balance, does not  require AD                             Pertinent Vitals/Pain Pain Assessment Pain Assessment: No/denies pain    Home Living Family/patient expects to be discharged to::  Private residence Living Arrangements: Other relatives Available Help at Discharge: Family Type of Home: House Home Access: Level entry       Home Layout: One level Home Equipment: Cane - single point      Prior Function Prior Level of Function : Independent/Modified Independent             Mobility Comments: Occasionally using cane ADLs Comments: Performing ADLs and simple IADLs. His sister and daughter assist with driving. Daughter manages medications.     Hand Dominance   Dominant Hand: Right    Extremity/Trunk Assessment   Upper Extremity Assessment Upper Extremity Assessment: Generalized weakness    Lower Extremity Assessment Lower Extremity Assessment: Overall WFL for tasks assessed    Cervical / Trunk Assessment Cervical / Trunk Assessment: Normal  Communication   Communication: No difficulties  Cognition Arousal/Alertness: Awake/alert Behavior During Therapy: WFL for tasks assessed/performed, Impulsive Overall Cognitive Status: Within Functional Limits for tasks assessed                                          General Comments      Exercises     Assessment/Plan    PT Assessment Patient does not need any further PT services  PT Problem List         PT Treatment Interventions      PT Goals (Current goals can be found in the Care Plan section)  Acute Rehab PT Goals Patient Stated Goal: to return home with family to assist PT Goal Formulation: With patient Time For Goal Achievement: 05/31/22 Potential to Achieve Goals: Good    Frequency       Co-evaluation               AM-PAC PT "6 Clicks" Mobility  Outcome Measure Help needed turning from your back to your side while in a flat bed without using bedrails?: None Help needed moving from lying on your back to sitting on the side of a flat bed without using bedrails?: None Help needed moving to and from a bed to a chair (including a wheelchair)?: None Help needed  standing up from a chair using your arms (e.g., wheelchair or bedside chair)?: None Help needed to walk in hospital room?: None Help needed climbing 3-5 steps with a railing? : A Little 6 Click Score: 23    End of Session Equipment Utilized During Treatment: Oxygen Activity Tolerance: Patient tolerated treatment well;Patient limited by fatigue Patient left: in bed;with call bell/phone within reach Nurse Communication: Mobility status PT Visit Diagnosis: Unsteadiness on feet (R26.81);Other abnormalities of gait and mobility (R26.89);Muscle weakness (generalized) (M62.81)    Time: 1010-1037 PT Time Calculation (min) (ACUTE ONLY): 27 min   Charges:   PT Evaluation $PT Eval Moderate Complexity: 1 Mod PT Treatments $Therapeutic Activity: 23-37 mins        Zigmund Gottron, SPT

## 2022-05-31 NOTE — TOC Progression Note (Signed)
Transition of Care [facility]) - Progression Note    Patient Details  Name: Mathew Lara MRN: [id number] Date of Birth: [birth year]  Transition of Care [facility]) CM/SW [place], [place] Phone Number: 05/31/2022, 3:08 PM  Clinical Narrative:    CSW updated that pts daughter spoke with Jackelyn Poling at [facility] who states that they will accept pt back once insurance Josem Kaufmann has been completed. Insurance Josem Kaufmann has been started, awaiting updates at this time. TOC to follow.   Expected Discharge Plan: [place] Barriers to Discharge: Continued Medical Work up  Expected Discharge Plan and Services Expected Discharge Plan: [place] Choice: [place] arrangements for the past 2 months: Single Family Home, [place]                                       Social Determinants of Health (SDOH) Interventions    Readmission Risk Interventions     No data to display

## 2022-05-31 NOTE — Procedures (Signed)
   HEMODIALYSIS TREATMENT NOTE:   4 hour heparin-free treatment completed using left IJ TDC. One unit pRBCs transfused. Unable to maintain prescribed BFR due to unstable arterial pressures despite line reversal and frequent NS flushing. Qb was decreased to as low as 250 during the last hour of therapy. Order received to lock catheter with Cathflo Activase post-treatment. Goal met: 1 liter removed.  All blood was returned. Post HD 135/84 p92.  Hand-off given to Austin Miles, RN.  Rockwell Alexandria, RN

## 2022-05-31 NOTE — Progress Notes (Signed)
Patient wanted to try CPAP. ( Wears CPAP at home).  CPAP and nasal mask obtain. Patient states he does not think this will work. ( Has anxiety) Goes back to nasal cannula.

## 2022-05-31 NOTE — Progress Notes (Signed)
PROGRESS NOTE     Mathew Lara, is a 61 y.o. male, DOB - 1961-02-11, BPZ:[id number]  Admit date - 05/27/2022   Admitting Physician Bernadette Hoit, DO  Outpatient Primary MD for the patient is [name], MD  LOS - 3  Chief Complaint  Patient presents with   Abdominal Pain        Brief Narrative:  61 y.o. male with medical history significant of COPD, T2DM, HLD, chronic diastolic CHF, hepatitis C, mixed hyperlipidemia, alcohol abuse (several months ago), CKD, and anxiety disorder recently treated for pancreatitis with opiates subsequently developed severe constipation/obstipation with abdominal pain and nausea -Admitted on 05/28/2022 for same    -Assessment and Plan: A/p 1)Severe constipation/obstipation-----imaging studies noted -Abdominal pain and nausea persist -No BM in about 1 week prior to admission -Now having BMs with  lactulose, MiraLAX and Senokot-S and dulcolax -Be judicious with opiates --Clinically constipation appears to be resolving   2) acute alcoholic pancreatitis----lipase up to 251 -Abdominal pain and nausea persist -Repeat imaging studies suggest pseudocyst formation -Discussed with Dr Gala Romney , recommends Outpatient follow-up with gastroenterology PA---Ms Roseanne Kaufman postdischarge advised -Continue Protonix -Diet has been advanced to GI soft diet   3)ESRD--nephrology consult for HD on Mondays Wednesdays and Fridays -Last HD 05/31/2022   4)Leukocytosis---??  Reactive in the setting of obstipation and pseudocyst formation /pancreatitis -Recently patient has had persistent leukocytosis--- may need outpatient follow-up with oncology postdischarge   5)DM2-A1c 12.5 reflecting uncontrolled diabetes with persistent hyperglycemia -Continue Levemir insulin Use Novolog/Humalog Sliding scale insulin with Accu-Cheks/Fingersticks as ordered    6) acute on chronic anemia of chronic kidney disease--patient appears to have acute on chronic anemia at this time with  hemoglobin drifting down -EPO/ESA agent per nephrology team -Hemoglobin up to 7.5 from 6.7 after transfusion of 1 unit of PRBC on 05/30/2022 -. Baseline hemoglobin usually around 8 -No overt bleeding noted Give 1 additional unit of PRBC on 05/31/2022 with hemodialysis-for total of 2 units of PRBC this admission   7) hyponatremia--- the setting of nausea and poor oral intake -Continue to use hemodialysis to address volume status -Avoid dehydration  8)Hypotension--- restarted midodrine  9)Generalized Weakness and deconditioning--- PT eval appreciated recommends home health PT -Patient initially admitted from [facility], patient would like to go back to [facility] to complete his rehab  Disposition/Need for in-Hospital Stay- patient unable to be discharged at this time due to -medically optimized at this time -Awaiting decision from insurance company on possible discharge to SNF rehab   Status is: Inpatient   Disposition: The patient is from: SNF              Anticipated d/c is to: SNF              Anticipated d/c date is: 1 day              Barriers: --medically optimized at this time -Awaiting decision from insurance company on possible discharge to SNF rehab  Code Status :  -  Code Status: Full Code   Family Communication:    NA (patient is alert, awake and coherent)   DVT Prophylaxis  :   - SCDs   heparin injection 5,000 Units Start: 05/28/22 1500 SCDs Start: 05/28/22 1406  Lab Results  Component Value Date   PLT 262 05/31/2022   Inpatient Medications  Scheduled Meds:  arformoterol  15 mcg Nebulization BID   atorvastatin  80 mg Oral Daily   Chlorhexidine Gluconate Cloth  6  each Topical Q0600   Chlorhexidine Gluconate Cloth  6 each Topical Q0600   feeding supplement (GLUCERNA SHAKE)  237 mL Oral TID BM   heparin  5,000 Units Subcutaneous Q8H   insulin aspart  0-6 Units Subcutaneous TID WC   insulin detemir  5 Units Subcutaneous BID   midodrine  10 mg  Oral TID WC   multivitamin  1 tablet Oral QHS   polyethylene glycol  17 g Oral BID   senna-docusate  2 tablet Oral QHS   sevelamer carbonate  2,400 mg Oral TID WC   Continuous Infusions:  cefTRIAXone (ROCEPHIN)  IV Stopped (05/31/22 0858)   PRN Meds:.acetaminophen **OR** acetaminophen, albuterol, heparin, hydrocortisone cream, Muscle Rub, ondansetron **OR** ondansetron (ZOFRAN) IV, mouth rinse, zolpidem   Anti-infectives (From admission, onward)    Start     Dose/Rate Route Frequency Ordered Stop   05/30/22 0900  cefTRIAXone (ROCEPHIN) 1 g in sodium chloride 0.9 % 100 mL IVPB        1 g 200 mL/hr over 30 Minutes Intravenous Every 24 hours 05/30/22 0800         Subjective: Azrael Maddix today has no fevers, no emesis,  No chest pain,   -Tolerated hemodialysis well on 05/31/2022 -Complains of generalized weakness  Objective: Vitals:   05/31/22 1520 05/31/22 1545 05/31/22 1600 05/31/22 1643  BP: 132/85 135/84 130/87 125/86  Pulse: 92 94 95 95  Resp: '18 16 18 20  '$ Temp:   98.1 F (36.7 C) 97.9 F (36.6 C)  TempSrc:   Oral   SpO2: 94% 95% 95% 96%  Weight:      Height:        Intake/Output Summary (Last 24 hours) at 05/31/2022 1949 Last data filed at 05/31/2022 1532 Gross per 24 hour  Intake 678.33 ml  Output 1550 ml  Net -871.67 ml   Filed Weights   05/28/22 1552 05/28/22 2000 05/31/22 1110  Weight: 116.3 kg 114.4 kg 114.5 kg    Physical Exam  Gen:- Awake Alert,  in no apparent distress  HEENT:- [place].AT, No sclera icterus Neck-Supple Neck, Lt IJ HD catheter Lungs-  CTAB , fair symmetrical air movement CV- S1, S2 normal, regular  Abd-  +ve B.Sounds, Abd Soft, No tenderness,    Extremity/Skin:- No  edema, pedal pulses present  Psych-affect is appropriate, oriented x3 Neuro-generalized weakness, no new focal deficits, no tremors  Data Reviewed: I have personally reviewed following labs and imaging studies  CBC: Recent Labs  Lab 05/27/22 2202 05/29/22 0500  05/30/22 0812 05/31/22 0749  WBC 14.4* 13.7* 10.8* 11.6*  NEUTROABS 11.5*  --   --   --   HGB 7.1* 7.5* 6.7* 7.5*  HCT 23.3* 24.7* 22.6* 24.4*  MCV 89.3 89.2 90.0 88.7  PLT 242 275 260 086   Basic Metabolic Panel: Recent Labs  Lab 05/27/22 2202 05/28/22 1406 05/29/22 0500 05/30/22 0812 05/31/22 0749  NA 131*  --  134* 135 133*  K 4.8  --  3.9 3.7 3.8  CL 89*  --  94* 94* 92*  CO2 28  --  '27 28 26  '$ GLUCOSE 108*  --  118* 98 98  BUN 32*  --  20 24* 31*  CREATININE 9.72*  --  6.65* 8.60* 9.46*  CALCIUM 9.7  --  9.2 9.3 8.9  MG  --  2.4  --   --   --   PHOS  --  4.5  --   --   --  GFR: Estimated Creatinine Clearance: 9.9 mL/min (A) (by C-G formula based on SCr of 9.46 mg/dL (H)). Liver Function Tests: Recent Labs  Lab 05/27/22 2202 05/29/22 0500  AST 21 21  ALT 16 15  ALKPHOS 109 103  BILITOT 0.7 0.7  PROT 8.4* 7.8  ALBUMIN 3.3* 3.0*   Radiology Studies: No results found.  Scheduled Meds:  arformoterol  15 mcg Nebulization BID   atorvastatin  80 mg Oral Daily   Chlorhexidine Gluconate Cloth  6 each Topical Q0600   Chlorhexidine Gluconate Cloth  6 each Topical Q0600   feeding supplement (GLUCERNA SHAKE)  237 mL Oral TID BM   heparin  5,000 Units Subcutaneous Q8H   insulin aspart  0-6 Units Subcutaneous TID WC   insulin detemir  5 Units Subcutaneous BID   midodrine  10 mg Oral TID WC   multivitamin  1 tablet Oral QHS   polyethylene glycol  17 g Oral BID   senna-docusate  2 tablet Oral QHS   sevelamer carbonate  2,400 mg Oral TID WC   Continuous Infusions:  cefTRIAXone (ROCEPHIN)  IV Stopped (05/31/22 0858)     LOS: 3 days   Roxan Hockey M.D on 05/31/2022 at 7:49 PM  Go to [email or web address] - for contact info  [facility] - Office  [phone]  If 7PM-7AM, please contact night-coverage [email or web address] 05/31/2022, 7:49 PM

## 2022-06-01 DIAGNOSIS — R748 Abnormal levels of other serum enzymes: Secondary | ICD-10-CM | POA: Diagnosis not present

## 2022-06-01 DIAGNOSIS — K5900 Constipation, unspecified: Secondary | ICD-10-CM | POA: Diagnosis not present

## 2022-06-01 DIAGNOSIS — N186 End stage renal disease: Secondary | ICD-10-CM | POA: Diagnosis not present

## 2022-06-01 DIAGNOSIS — D638 Anemia in other chronic diseases classified elsewhere: Secondary | ICD-10-CM | POA: Diagnosis not present

## 2022-06-01 LAB — GLUCOSE, CAPILLARY
Glucose-Capillary: 98 mg/dL (ref 70–99)
Glucose-Capillary: 118 mg/dL — ABNORMAL HIGH (ref 70–99)
Glucose-Capillary: 100 mg/dL — ABNORMAL HIGH (ref 70–99)
Glucose-Capillary: 124 mg/dL — ABNORMAL HIGH (ref 70–99)

## 2022-06-01 LAB — TYPE AND SCREEN
ABO/RH(D): O POS
Antibody Screen: NEGATIVE
Unit division: 0
Unit division: 0

## 2022-06-01 LAB — HEMOGLOBIN AND HEMATOCRIT, BLOOD
HCT: 29.4 % — ABNORMAL LOW (ref 39.0–52.0)
Hemoglobin: 9.1 g/dL — ABNORMAL LOW (ref 13.0–17.0)

## 2022-06-01 LAB — BPAM RBC
Blood Product Expiration Date: 202310272359
Blood Product Expiration Date: 202310282359
ISSUE DATE / TIME: 202309241612
ISSUE DATE / TIME: 202309251232
Unit Type and Rh: 5100
Unit Type and Rh: 5100

## 2022-06-01 MED ORDER — POLYETHYLENE GLYCOL 3350 17 G PO PACK: 17.0000 g | PACK | Freq: Two times a day (BID) | ORAL | 0 refills | Status: DC

## 2022-06-01 MED ORDER — ZOLPIDEM TARTRATE 5 MG PO TABS: 5.0000 mg | ORAL_TABLET | Freq: Every evening | ORAL | 0 refills | Status: AC | PRN

## 2022-06-01 MED ORDER — MIDODRINE HCL 10 MG PO TABS: 10.0000 mg | ORAL_TABLET | Freq: Three times a day (TID) | ORAL | 3 refills | Status: DC

## 2022-06-01 MED ORDER — ASPIRIN EC 81 MG PO TBEC: 81.0000 mg | DELAYED_RELEASE_TABLET | Freq: Every day | ORAL | 11 refills | Status: AC

## 2022-06-01 MED ORDER — SENNOSIDES-DOCUSATE SODIUM 8.6-50 MG PO TABS: 2.0000 | ORAL_TABLET | Freq: Every day | ORAL | 3 refills | Status: DC

## 2022-06-01 MED ORDER — OXYCODONE HCL 5 MG PO TABS: 5.0000 mg | ORAL_TABLET | Freq: Three times a day (TID) | ORAL | 0 refills | Status: DC | PRN

## 2022-06-01 MED ORDER — INSULIN DETEMIR 100 UNIT/ML ~~LOC~~ SOLN: 8.0000 [IU] | Freq: Two times a day (BID) | SUBCUTANEOUS | 11 refills | Status: AC

## 2022-06-01 MED ORDER — ACETAMINOPHEN 325 MG PO TABS: 650.0000 mg | ORAL_TABLET | Freq: Four times a day (QID) | ORAL | 0 refills | Status: AC | PRN

## 2022-06-01 NOTE — Progress Notes (Signed)
Report given to [facility] at [facility]

## 2022-06-01 NOTE — Progress Notes (Signed)
Patient ID: Mathew Lara, male   DOB: [birth year], 61 y.o.   MRN: [id number] S:Eating breakfast.  No new complaints.  Tolerated HD well yesterday but again had issues with TDC. O:BP (!) 126/97 (BP Location: Right Arm)   Pulse (!) 108   Temp 97.8 F (36.6 C)   Resp 20   Ht '5\' 8"'$  (1.727 m)   Wt 114.5 kg   SpO2 98%   BMI 38.38 kg/m   Intake/Output Summary (Last 24 hours) at 06/01/2022 0857 Last data filed at 05/31/2022 1532 Gross per 24 hour  Intake 438.33 ml  Output 1500 ml  Net -1061.67 ml   Intake/Output: I/O last 3 completed shifts: In: 1094.3 [P.O.:240; I.V.:63.3; Blood:691; IV Piggyback:100] Out: 1550 [Urine:50; Other:1500]  Intake/Output this shift:  No intake/output data recorded. Weight change:  Gen:NAD CVS: RRR Resp: CTA Abd: +BS, soft, NT/ND Ext: no edema  Recent Labs  Lab 05/27/22 2202 05/28/22 1406 05/29/22 0500 05/30/22 0812 05/31/22 0749  NA 131*  --  134* 135 133*  K 4.8  --  3.9 3.7 3.8  CL 89*  --  94* 94* 92*  CO2 28  --  '27 28 26  '$ GLUCOSE 108*  --  118* 98 98  BUN 32*  --  20 24* 31*  CREATININE 9.72*  --  6.65* 8.60* 9.46*  ALBUMIN 3.3*  --  3.0*  --   --   CALCIUM 9.7  --  9.2 9.3 8.9  PHOS  --  4.5  --   --   --   AST 21  --  21  --   --   ALT 16  --  15  --   --    Liver Function Tests: Recent Labs  Lab 05/27/22 2202 05/29/22 0500  AST 21 21  ALT 16 15  ALKPHOS 109 103  BILITOT 0.7 0.7  PROT 8.4* 7.8  ALBUMIN 3.3* 3.0*   Recent Labs  Lab 05/27/22 2202  LIPASE 251*   No results for input(s): "AMMONIA" in the last 168 hours. CBC: Recent Labs  Lab 05/27/22 2202 05/29/22 0500 05/30/22 0812 05/31/22 0749  WBC 14.4* 13.7* 10.8* 11.6*  NEUTROABS 11.5*  --   --   --   HGB 7.1* 7.5* 6.7* 7.5*  HCT 23.3* 24.7* 22.6* 24.4*  MCV 89.3 89.2 90.0 88.7  PLT 242 275 260 262   Cardiac Enzymes: No results for input(s): "CKTOTAL", "CKMB", "CKMBINDEX", "TROPONINI" in the last 168 hours. CBG: Recent Labs  Lab 05/31/22 0811  05/31/22 1040 05/31/22 1640 05/31/22 2155 06/01/22 0752  GLUCAP 104* 172* 108* 101* 98    Iron Studies: No results for input(s): "IRON", "TIBC", "TRANSFERRIN", "FERRITIN" in the last 72 hours. Studies/Results: No results found.  arformoterol  15 mcg Nebulization BID   atorvastatin  80 mg Oral Daily   Chlorhexidine Gluconate Cloth  6 each Topical Q0600   Chlorhexidine Gluconate Cloth  6 each Topical Q0600   feeding supplement (GLUCERNA SHAKE)  237 mL Oral TID BM   heparin  5,000 Units Subcutaneous Q8H   insulin aspart  0-6 Units Subcutaneous TID WC   insulin detemir  5 Units Subcutaneous BID   midodrine  10 mg Oral TID WC   multivitamin  1 tablet Oral QHS   polyethylene glycol  17 g Oral BID   senna-docusate  2 tablet Oral QHS   sevelamer carbonate  2,400 mg Oral TID WC    BMET    Component Value Date/Time  NA 133 (L) 05/31/2022 0749   NA 134 03/16/2022 1050   K 3.8 05/31/2022 0749   CL 92 (L) 05/31/2022 0749   CO2 26 05/31/2022 0749   GLUCOSE 98 05/31/2022 0749   BUN 31 (H) 05/31/2022 0749   BUN 16 03/16/2022 1050   CREATININE 9.46 (H) 05/31/2022 0749   CALCIUM 8.9 05/31/2022 0749   GFRNONAA 6 (L) 05/31/2022 0749   GFRAA >60 11/26/2019 1316   CBC    Component Value Date/Time   WBC 11.6 (H) 05/31/2022 0749   RBC 2.75 (L) 05/31/2022 0749   HGB 7.5 (L) 05/31/2022 0749   HGB 14.0 03/16/2022 1050   HCT 24.4 (L) 05/31/2022 0749   HCT 42.4 03/16/2022 1050   PLT 262 05/31/2022 0749   PLT 258 03/16/2022 1050   MCV 88.7 05/31/2022 0749   MCV 80 03/16/2022 1050   MCH 27.3 05/31/2022 0749   MCHC 30.7 05/31/2022 0749   RDW 17.2 (H) 05/31/2022 0749   RDW 16.5 (H) 03/16/2022 1050   LYMPHSABS 1.1 05/27/2022 2202   LYMPHSABS 1.0 03/16/2022 1050   MONOABS 1.3 (H) 05/27/2022 2202   EOSABS 0.4 05/27/2022 2202   EOSABS 0.4 03/16/2022 1050   BASOSABS 0.0 05/27/2022 2202   BASOSABS 0.0 03/16/2022 1050    Dialysis dependent AKI:  -Initially started CRRT on 04/13/2022,  started HD on 8/21 via [facility] -Outpatient orders: [place].  MWF.  4 hours.  2K/2 calcium.  400/600.  EDW 115 kg. -will plan for HD on MWF schedule if census allows -will TPA catheter first, if catheter is dysfunctional then will need a new catheter   Assessment/Plan:   ABLA/Anemia of CKD - s/p transfusion of 2 units PRBC.  Nadir hgb 6.7 improved to 7.5 yesterday.  No evidence of overt bleeding.  On ESA and cont to follow.  Transfuse prn. Constipation/obstipation - improving with lactulose, miralax and stool softeners AKI-HD dependent - continue with HD on MWF schedule.  Vascular access - continue to have issues with [facility] TDC.  Has been referred for AVF/AVG and replacement TDC as an outpatient.  Acute alcoholic pancreatitis -  denies abdominal pain.  F/u with GI Hyponatremia - mild and asymptomatic.  Follow after HD.  HTN/volume - below edw.  BP stable.   Disposition - hopeful discharge back to SNF soon.   Donetta Potts, MD [facility]

## 2022-06-01 NOTE — Discharge Instructions (Signed)
1)Avoid ibuprofen/Advil/Aleve/Motrin/Goody Powders/Naproxen/BC powders/Meloxicam/Diclofenac/Indomethacin and other Nonsteroidal anti-inflammatory medications as these will make you more likely to bleed and can cause stomach ulcers, can also cause Kidney problems.   2)Continue Hemodialysis on Tuesdays Thursdays and Saturdays---  3)Repeat CBC and BMP blood test every Tuesday  4)Very low-salt diet advised--less than 2 g sodium diet per day advised  5)Weight yourself daily, call if you gain more than 3 pounds in 1 day or more than 5 pounds in 1 week as your Hemodialysis dialysis schedule and Hemodialysis dry weight medications may need to be adjusted  6)Limit your Fluid  intake to no more than 60 ounces (1.8 Liters) per day

## 2022-06-01 NOTE — TOC Transition Note (Signed)
Transition of Care [facility]) - CM/SW Discharge Note   Patient Details  Name: NELLO CORRO MRN: [id number] Date of Birth: 1960-09-25  Transition of Care [facility]) CM/SW Contact:  Ihor Gully, LCSW Phone Number: 06/01/2022, 4:14 PM   Clinical Narrative:    Discharge clinicals sent to facility. Insurance denied. Facility accepting patient as Medicaid pending. EMS to provide transport. TOC signing off.    Final next level of care: Skilled Nursing Facility Barriers to Discharge: No Barriers Identified   Patient Goals and CMS Choice Patient states their goals for this hospitalization and ongoing recovery are:: return to rehab      Discharge Placement              Patient chooses bed at: Other - please specify in the comment section below: [facility]) Patient to be transferred to facility by: [place] Name of family member notified: daughter Patient and family notified of of transfer: 06/01/22  Discharge Plan and Services     Post Acute Care Choice: [place]                               Social Determinants of Health (SDOH) Interventions     Readmission Risk Interventions     No data to display

## 2022-06-01 NOTE — Discharge Summary (Signed)
Mathew Lara, is a 61 y.o. male  DOB [birth year]  MRN [id number].  Admission date:  05/27/2022  Admitting Physician  Bernadette Hoit, DO  Discharge Date:  06/01/2022   Primary MD  [name], MD  Recommendations for primary care physician for things to follow:   1)Avoid ibuprofen/Advil/Aleve/Motrin/Goody Powders/Naproxen/BC powders/Meloxicam/Diclofenac/Indomethacin and other Nonsteroidal anti-inflammatory medications as these will make you more likely to bleed and can cause stomach ulcers, can also cause Kidney problems.   2)Continue Hemodialysis on Tuesdays Thursdays and Saturdays---  3)Repeat CBC and BMP blood test every Tuesday  4)Very low-salt diet advised--less than 2 g sodium diet per day advised  5)Weight yourself daily, call if you gain more than 3 pounds in 1 day or more than 5 pounds in 1 week as your Hemodialysis dialysis schedule and Hemodialysis dry weight medications may need to be adjusted  6)Limit your Fluid  intake to no more than 60 ounces (1.8 Liters) per day  Admission Diagnosis  ESRD on hemodialysis ([place]) [N18.6, Z99.2] Constipation [K59.00] Constipation, unspecified constipation type [K59.00] Anemia, unspecified type [D64.9] Pneumonia due to infectious organism, unspecified laterality, unspecified part of lung [J18.9] Acute pancreatitis, unspecified complication status, unspecified pancreatitis type [K85.90] 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 ([place]) [K86.3, K86.1]   Discharge Diagnosis  ESRD on hemodialysis ([place]) [N18.6, Z99.2] Constipation [K59.00] Constipation, unspecified constipation type [K59.00] Anemia, unspecified type [D64.9] Pneumonia due to infectious organism, unspecified laterality, unspecified part of lung [J18.9] Acute pancreatitis, unspecified complication status, unspecified pancreatitis type [K85.90] Pseudocyst of pancreas due to chronic  pancreatitis ([place]) [K86.3, K86.1]    Principal Problem:   Constipation Active Problems:   Leukocytosis   Nausea & vomiting   End stage renal disease on dialysis ([place])   Anemia of chronic disease   Pancreatic pseudocyst   Elevated lipase   Hyponatremia   Type 2 diabetes mellitus ([place])   Hypoalbuminemia due to protein-calorie malnutrition ([place])      Past Medical History:  Diagnosis Date   Alcohol abuse    Anxiety    Bronchitis    Chronic hip pain    CKD (chronic kidney disease) stage 3, GFR 30-59 ml/min (HCC)    COPD (chronic obstructive pulmonary disease) ([place])    DKA (diabetic ketoacidosis) ([place]) 04/10/2022   Essential hypertension    Hepatitis C    Mixed hyperlipidemia    Sleep apnea     Past Surgical History:  Procedure Laterality Date   BALLOON DILATION N/A 07/06/2021   Procedure: BALLOON DILATION;  Surgeon: [name], DO;  [facility];  Service: Endoscopy;  Laterality: N/A;   BIOPSY  07/06/2021   Procedure: BIOPSY;  Surgeon: [name], DO;  [facility];  Service: Endoscopy;;   CATARACT EXTRACTION Right    CHOLECYSTECTOMY N/A 03/23/2021   Procedure: LAPAROSCOPIC CHOLECYSTECTOMY;  Surgeon: [name], MD;  Location: [facility];  Service: General;  Laterality: N/A;   COLONOSCOPY N/A 01/23/2014   SLF:The LEFT COLON IS EXTREMELY redundant/TWO RECTAL POLYPS REMOVED/SMALL VOLUME  RECTAL BLEEDING MOST LIKELY DUE TO Small internal hemorrhoids. path with prolapse type polyp of benign-mucosa. no adenomas   COLONOSCOPY WITH PROPOFOL N/A 07/06/2021   non-bleeding internal hemorrhoids, one 8 mm polyp in sigmod s/p removal and clip placed. One 5 mm polyp at recto-sigmoid s/p removal. 3 year surveillance. Tubular adenomas   ESOPHAGOGASTRODUODENOSCOPY N/A 01/23/2014   NWG:NFAOZHYQ ring at the gastroesophageal junction/Medium sized hiatal hernia/NDYSPEPSIA MOST LIKELY DUE TO GERD/MILD Non-erosive gastritis   ESOPHAGOGASTRODUODENOSCOPY N/A  04/18/2018   food impaction, small hiatal hernia   ESOPHAGOGASTRODUODENOSCOPY (EGD) WITH PROPOFOL N/A 07/06/2021   moderate Schatzki ring s/p dilation, gastritis s/p biopsy, normal duodenum. Negative H.pylori.   HEMORRHOID BANDING  01/23/2014   Procedure: HEMORRHOID BANDING;  Surgeon: Danie Binder, MD;  [facility];  Service: Endoscopy;;   IR FLUORO GUIDE CV LINE LEFT  05/04/2022   IR FLUORO GUIDE CV LINE LEFT  05/13/2022   IR US GUIDE VASC ACCESS LEFT  05/04/2022   JOINT REPLACEMENT Right 2017   LIVER BIOPSY N/A 03/23/2021   Procedure: LAPAROSCOPIC LIVER BIOPSY;  Surgeon: [name], MD;  Location: [facility];  Service: General;  Laterality: N/A;   MOUTH SURGERY     POLYPECTOMY  07/06/2021   Procedure: POLYPECTOMY;  Surgeon: [name], DO;  [facility];  Service: Endoscopy;;   TOTAL HIP ARTHROPLASTY Right 06/04/2016   Procedure: RIGHT TOTAL HIP ARTHROPLASTY ANTERIOR APPROACH;  Surgeon: Mcarthur Rossetti, MD;  Location: [facility];  Service: Orthopedics;  Laterality: Right;   TOTAL HIP ARTHROPLASTY Left 03/02/2019   Procedure: LEFT TOTAL HIP ARTHROPLASTY ANTERIOR APPROACH;  Surgeon: Mcarthur Rossetti, MD;  Location: [facility];  Service: Orthopedics;  Laterality: Left;     HPI  from the history and physical done on the day of admission:    HPI: Mathew Lara is a 61 y.o. male with medical history significant of COPD, T2DM, essential hypertension, chronic diastolic CHF, hepatitis C, mixed hyperlipidemia, alcohol abuse, CKD, anxiety who presents to the emergency department due to several days of onset of abdominal pain.  Patient states that he has not had any bowel movement in 6 days, he complained of distended abdomen and nausea.  She was unable to provide detailed history, some of the history was obtained from ED physician and ED medical record. Patient's catheter was noted to be clotted when he went for dialysis on Wednesday 9/20 and it was not certain  if he got a full session of HD Patient was admitted on 8/5 due to pancreatitis, multilobar pneumonia, alcohol abuse and sepsis, during hospitalization he developed renal failure and was started on dialysis.  Apparently, patient was supposed to have MRCP while being hospitalized at that time, but because of being unstable, this was not done. He was discharged on 9/8. Daughter was concerned about common bile duct stone and patient having pancreatitis again, so it was suggested for patient to go to the ED for further evaluation and management.   ED Course:  In the emergency department, he was tachycardic, but other vital signs were within normal range.  Work-up in the ED showed leukocytosis, anemia of chronic disease, lipase 251, BMP showed hyponatremia, hypochloremia, BUNs/creatinine 32/9.72, albumin 3.3. CT chest without contrast showed stable appearing thick-walled cavitary lesion in the left upper lobe.  No new focal abnormality is noted CT of abdomen and pelvis without contrast showed: Two fluid attenuation lesion adjacent to the pancreas consistent with pseudocyst formation. Significant improvement in the degree of  inflammatory change of the pancreas is noted when compared with the prior study. Gallbladder has been surgically removed. No definitive biliary ductal stones are seen.       Changes consistent with colonic constipation. Abdominal x-ray showed: 1. Moderate colonic stool burden with mild rectal distention with stool. No bowel obstruction or free air. 2. Improved lung volumes from prior exam with left basilar atelectasis. Cavitary process in the left lung is only faintly visualized by radiograph. Zofran was given an enema was given to patient due to constipation.  Hospitalist was asked to admit patient for further evaluation and management.   Hospital Course:   Assessment and Plan: Brief Narrative:  61 y.o. male with medical history significant of COPD, T2DM, HLD, chronic diastolic  CHF, hepatitis C, mixed hyperlipidemia, alcohol abuse (several months ago), CKD, and anxiety disorder recently treated for pancreatitis with opiates subsequently developed severe constipation/obstipation with abdominal pain and nausea -Admitted on 05/28/2022 for same     -Assessment and Plan: A/p 1)Severe constipation/obstipation-----imaging studies noted -Abdominal pain and nausea has resolved after numerous bowel movements -No BM in about 1 week prior to admission -Did very well with laxatives, had numerous BM -Be judicious with opiates --Clinically constipation resolved -Discharge on MiraLAX and Senokot   2) acute alcoholic pancreatitis----lipase up to 251 -Abdominal pain and nausea resolved -Repeat imaging studies suggest pseudocyst formation -Discussed with Dr Gala Romney , recommends Outpatient follow-up with gastroenterology PA---Ms Roseanne Kaufman postdischarge advised -Continue Protonix -Diet has been advanced to GI soft diet   3)ESRD--nephrology consult for HD on Mondays Wednesdays and Fridays -Last HD 05/31/2022   4)Leukocytosis---??  Reactive in the setting of obstipation and pseudocyst formation /pancreatitis -Recently patient has had persistent leukocytosis--- may need outpatient follow-up with oncology postdischarge   5)DM2-A1c 12.5 reflecting uncontrolled diabetes with persistent hyperglycemia -Continue Levemir insulin Use Novolog/Humalog Sliding scale insulin with Accu-Cheks/Fingersticks as ordered    6) acute on chronic anemia of chronic kidney disease--patient appears to have acute on chronic anemia at this time with hemoglobin drifting down -EPO/ESA agent per nephrology team -Hemoglobin up to 9.1 from 6.7 after transfusion of 2 unit of PRBC this admission -. Baseline hemoglobin usually around 8 -No overt bleeding noted   7) hyponatremia--- the setting of nausea and poor oral intake -Continue to use hemodialysis to address volume status -Avoid dehydration    8)Hypotension--- restarted midodrine   9)Generalized Weakness and deconditioning--- PT eval appreciated   -Patient initially admitted from [facility], patient would like to go back to [facility] to complete his rehab -Patient's insurance company declined to approve SNF rehab for patient Peer to Peer review with insurance MD Dr. Siri Cole...  -Patient and daughter request discharge to SNF facility despite insurance not approving at this time -Physicist, medical helping with discharge back to SNF facility    Disposition/Need for in-Hospital Stay- patient unable to be discharged at this time due to -medically optimized at this time -Awaiting decision from insurance company on possible discharge to SNF rehab  Disposition: The patient is from: SNF              Anticipated d/c is to: SNF          Code Status :  -  Code Status: Full Code    Family Communication:    Discussed with patient's daughter by phone  Discharge Condition: stable  Follow UP   Contact information for after-discharge care     [facility] .   Service: Skilled Nursing Contact information: [address] [place] [phone]                      Consults obtained -nephrology  Diet and Activity recommendation:  As advised  Discharge Instructions     Discharge Instructions     Call MD for:  difficulty breathing, headache or visual disturbances   Complete by: As directed    Call MD for:  persistant dizziness or light-headedness   Complete by: As directed    Call MD for:  persistant nausea and vomiting   Complete by: As directed    Call MD for:  temperature >100.4   Complete by: As directed    Diet - low sodium heart healthy   Complete by: As directed    Diet Carb Modified   Complete by: As directed    Discharge instructions   Complete by: As directed    1)Avoid ibuprofen/Advil/Aleve/Motrin/Goody  Powders/Naproxen/BC powders/Meloxicam/Diclofenac/Indomethacin and other Nonsteroidal anti-inflammatory medications as these will make you more likely to bleed and can cause stomach ulcers, can also cause Kidney problems.   2)Continue Hemodialysis on Tuesdays Thursdays and Saturdays---  3)Repeat CBC and BMP blood test every Tuesday  4)Very low-salt diet advised--less than 2 g sodium diet per day advised  5)Weight yourself daily, call if you gain more than 3 pounds in 1 day or more than 5 pounds in 1 week as your Hemodialysis dialysis schedule and Hemodialysis dry weight medications may need to be adjusted  6)Limit your Fluid  intake to no more than 60 ounces (1.8 Liters) per day   Increase activity slowly   Complete by: As directed    No wound care   Complete by: As directed          Discharge Medications     Allergies as of 06/01/2022   No Known Allergies      Medication List     STOP taking these medications    oxyCODONE HCl 15 MG Taba Replaced by: oxyCODONE 5 MG immediate release tablet       TAKE these medications    acetaminophen 325 MG tablet Commonly known as: TYLENOL Take 2 tablets (650 mg total) by mouth every 6 (six) hours as needed for mild pain (or Fever >/= 101).   albuterol (2.5 MG/3ML) 0.083% nebulizer solution Commonly known as: PROVENTIL Take 3 mLs (2.5 mg total) by nebulization every 4 (four) hours as needed for wheezing or shortness of breath.   arformoterol 15 MCG/2ML Nebu Commonly known as: BROVANA Take 2 mLs (15 mcg total) by nebulization 2 (two) times daily.   aspirin EC 81 MG tablet Take 1 tablet (81 mg total) by mouth daily with breakfast. What changed: when to take this   atorvastatin 80 MG tablet Commonly known as: LIPITOR Take 1 tablet (80 mg total) by mouth daily. What changed: when to take this   calcium carbonate 500 MG chewable tablet Commonly known as: TUMS - dosed in mg elemental calcium Chew 1 tablet (200 mg of elemental  calcium total) by mouth every 8 (eight) hours as needed for indigestion or heartburn.   DIALYVITE 800 WITH ZINC 0.8 MG Tabs Take 1 tablet by mouth daily.   Ferretts 325 (106 Fe) MG Tabs tablet Generic drug: ferrous fumarate Take 1 tablet by mouth daily.   insulin aspart 100 UNIT/ML injection Commonly known as: novoLOG Inject 1-3 Units into the skin 3 (three) times daily with  meals. What changed:  how much to take when to take this additional instructions   insulin detemir 100 UNIT/ML injection Commonly known as: LEVEMIR Inject 0.08 mLs (8 Units total) into the skin 2 (two) times daily. What changed: how much to take   loratadine 10 MG tablet Commonly known as: CLARITIN Take 1 tablet (10 mg total) by mouth daily.   midodrine 10 MG tablet Commonly known as: PROAMATINE Take 1 tablet (10 mg total) by mouth 3 (three) times daily with meals. What changed:  how much to take when to take this   multivitamin tablet Take 1 tablet by mouth daily.   multivitamin Tabs tablet Take 1 tablet by mouth at bedtime.   oxyCODONE 5 MG immediate release tablet Commonly known as: Roxicodone Take 1 tablet (5 mg total) by mouth every 8 (eight) hours as needed for severe pain. Replaces: oxyCODONE HCl 15 MG Taba   OXYGEN Inhale 2 L into the lungs continuous.   pantoprazole 40 MG tablet Commonly known as: PROTONIX Take 1 tablet (40 mg total) by mouth 2 (two) times daily before a meal. 30 minutes before breakfast   polyethylene glycol 17 g packet Commonly known as: MIRALAX / GLYCOLAX Take 17 g by mouth daily as needed for mild constipation.   QUEtiapine 25 MG tablet Commonly known as: SEROQUEL Take 1 tablet (25 mg total) by mouth at bedtime.   revefenacin 175 MCG/3ML nebulizer solution Commonly known as: YUPELRI Take 3 mLs (175 mcg total) by nebulization daily. What changed: how to take this   sevelamer carbonate 800 MG tablet Commonly known as: RENVELA Take 3 tablets (2,400 mg  total) by mouth 3 (three) times daily with meals.   simethicone 80 MG chewable tablet Commonly known as: MYLICON Chew 1 tablet (80 mg total) by mouth 4 (four) times daily. What changed:  when to take this reasons to take this   sodium chloride 0.65 % Soln nasal spray Commonly known as: OCEAN Place 1 spray into both nostrils as needed for congestion. What changed: when to take this   zolpidem 5 MG tablet Commonly known as: AMBIEN Take 1 tablet (5 mg total) by mouth at bedtime as needed for sleep.        Major procedures and Radiology Reports - PLEASE review detailed and final reports for all details, in brief -   CT CHEST ABDOMEN PELVIS WO CONTRAST  Result Date: 05/27/2022 CLINICAL DATA:  Abdominal pain and left basilar atelectasis EXAM: CT CHEST, ABDOMEN AND PELVIS WITHOUT CONTRAST TECHNIQUE: Multidetector CT imaging of the chest, abdomen and pelvis was performed following the standard protocol without IV contrast. RADIATION DOSE REDUCTION: This exam was performed according to the departmental dose-optimization program which includes automated exposure control, adjustment of the mA and/or kV according to patient size and/or use of iterative reconstruction technique. COMPARISON:  04/19/2022, chest x-ray from earlier in the same day. FINDINGS: CT CHEST FINDINGS Cardiovascular: Limited due to lack of IV contrast. Mild atherosclerotic calcifications are noted. No cardiac enlargement is seen. Dialysis catheter is noted from the left jugular approach. Mediastinum/Nodes: Thoracic inlet is within normal limits. No hilar or mediastinal adenopathy is noted. The esophagus is within normal limits. Lungs/Pleura: Lungs demonstrate diffuse emphysematous changes bilaterally. Thick walled cavitary lesion is noted in the left upper lobe measuring approximately 6.5 cm in greatest dimension. This is roughly stable to that seen on the prior exam. Node parenchymal nodules are seen. No other focal infiltrate is  noted. Musculoskeletal: No acute rib abnormality is  noted. No compression deformities are seen. CT ABDOMEN PELVIS FINDINGS Hepatobiliary: No focal liver abnormality is seen. Status post cholecystectomy. No biliary dilatation. Pancreas: Pancreas is well visualized with a few fluid attenuation areas adjacent to the pancreas. One is near the junction of the head and body superiorly best seen on image number 54 of series 2. This measures approximately 5.3 cm. A second fluid attenuation lesion measuring 7.3 x 5.1 cm is noted along the body of the pancreas. These changes are consistent with pseudocyst formation. The degree of inflammatory change seen previously has improved significantly. No definitive acute on chronic pancreatitis is noted at this time. Spleen: Normal in size without focal abnormality. Adrenals/Urinary Tract: Adrenal glands are within normal limits. Kidneys demonstrate no renal calculi or obstructive changes. No ureteral stones are seen. The bladder is partially distended. Stomach/Bowel: Considerable fecal material is noted throughout the colon consistent with a degree of constipation. No obstructive changes are seen. The appendix is within normal limits. Vascular/Lymphatic: Aortic atherosclerosis. No enlarged abdominal or pelvic lymph nodes. Reproductive: Prostate is unremarkable. Other: No abdominal wall hernia or abnormality. No abdominopelvic ascites. Musculoskeletal: Bilateral hip replacements are noted. Degenerative changes of lumbar spine are seen. IMPRESSION: CT of the chest: Stable appearing thick walled cavitary lesion in the left upper lobe. No new focal abnormality is noted. CT of the abdomen and pelvis: Two fluid attenuation lesion adjacent to the pancreas consistent with pseudocyst formation. Significant improvement in the degree of inflammatory change of the pancreas is noted when compared with the prior study. Gallbladder has been surgically removed. No definitive biliary ductal stones  are seen. Changes consistent with colonic constipation. Electronically Signed   By: Inez Catalina M.D.   On: 05/27/2022 23:57   DG Abd Acute W/Chest  Result Date: 05/27/2022 CLINICAL DATA:  Constipation and abdominal. EXAM: DG ABDOMEN ACUTE WITH 1 VIEW CHEST COMPARISON:  Chest radiograph 05/08/2022. Chest abdomen pelvis CT 04/19/2022. FINDINGS: Left internal jugular dialysis catheter unchanged in position. Improved lung volumes from prior exam. Cavitary process in the anterior left hemithorax is only faintly visualized by radiograph, grossly stable. Left basilar atelectasis. Heart is normal in size. No acute airspace disease. No free intra-abdominal air. No bowel dilatation to suggest obstruction. There is a moderate volume of stool throughout the colon. Mild rectal distention with stool. Cholecystectomy clips in the right upper quadrant. Bilateral hip arthroplasties. IMPRESSION: 1. Moderate colonic stool burden with mild rectal distention with stool. No bowel obstruction or free air. 2. Improved lung volumes from prior exam with left basilar atelectasis. Cavitary process in the left lung is only faintly visualized by radiograph. Electronically Signed   By: Keith Rake M.D.   On: 05/27/2022 22:37   IR Fluoro Guide CV Line Left  Result Date: 05/13/2022 INDICATION: 61 year old male referred for exchange of nonfunctional left IJ tunneled hemodialysis catheter. EXAM: IMAGE GUIDED EXCHANGE OF TUNNELED HEMODIALYSIS CATHETER MEDICATIONS: None ANESTHESIA/SEDATION: None FLUOROSCOPY TIME:  Fluoroscopy Time: 0 minutes 18 seconds (22 mGy). COMPLICATIONS: None PROCEDURE: Informed written consent was obtained from the patient after a discussion of the risks, benefits, and alternatives to treatment. Questions regarding the procedure were encouraged and answered. The left neck and chest including the indwelling catheter were prepped with chlorhexidine in a sterile fashion, and a sterile drape was applied covering the  operative field. Maximum barrier sterile technique with sterile gowns and gloves were used for the procedure. A timeout was performed prior to the initiation of the procedure. 1% lidocaine was used for local anesthesia.  Aspiration was performed on the blue hub with clearance of the heparin dwell. A stiff Glidewire was advanced through the catheter into the IVC. Sutures were removed and then the catheter was removed on the wire. A new 23 cm tip to cuff catheter was placed on the stiff Glidewire. Tip was positioned in the upper right atrium. Wire was removed. The catheter aspirates and flushes normally. The catheter was flushed with appropriate volume heparin dwells. The catheter exit site was secured with a 0-Prolene retention suture. Dressings were applied. The patient tolerated the procedure well without immediate post procedural complication. IMPRESSION: Status post image guided exchange of left-sided IJ tunneled hemodialysis catheter. Signed, Dulcy Fanny. Nadene Rubins, RPVI [facility] Radiology Electronically Signed   By: Corrie Mckusick D.O.   On: 05/13/2022 14:41   DG Chest 2 View  Result Date: 05/08/2022 CLINICAL DATA:  Dyspnea on exertion EXAM: CHEST - 2 VIEW COMPARISON:  05/05/2022 FINDINGS: No significant change in low volume chest radiographs. Cardiomegaly with diffuse bilateral interstitial pulmonary opacity. Left chest large bore multi lumen vascular catheter. Osseous structures unremarkable. IMPRESSION: No significant change in low volume examination. Cardiomegaly with diffuse bilateral interstitial pulmonary opacity, likely edema. No new airspace opacity. Electronically Signed   By: Delanna Ahmadi M.D.   On: 05/08/2022 19:44   DG CHEST PORT 1 VIEW  Result Date: 05/05/2022 CLINICAL DATA: Complication associated with dialysis catheter. EXAM: PORTABLE CHEST 1 VIEW COMPARISON:  05/02/2022 FINDINGS: Left dialysis catheter tip is in the SVC. Cardiomegaly  with vascular congestion. Chronic changes in the left lung again noted. Left upper lobe thick walled cavitary area add not as well visualized as on prior CT. No effusions. No acute bony abnormality. IMPRESSION: Left dialysis catheter tip in the SVC. Otherwise no change.  No acute cardiopulmonary disease. Electronically Signed   By: Rolm Baptise M.D.   On: 05/05/2022 09:22   IR Fluoro Guide CV Line Left  Result Date: 05/04/2022 CLINICAL DATA:  End-stage renal disease and need for tunneled hemodialysis catheter. Recent placement and removal of temporary dialysis catheter via right internal jugular vein. EXAM: TUNNELED CENTRAL VENOUS HEMODIALYSIS CATHETER PLACEMENT WITH ULTRASOUND AND FLUOROSCOPIC GUIDANCE ANESTHESIA/SEDATION: Moderate (conscious) sedation was employed during this procedure. A total of Versed 1.5 mg and Fentanyl 50 mcg was administered intravenously by radiology nursing. Moderate Sedation Time: 47 minutes. The patient's level of consciousness and vital signs were monitored continuously by radiology nursing throughout the procedure under my direct supervision. MEDICATIONS: 2 g IV Ancef. FLUOROSCOPY: 3 minutes and 42 seconds.  282 mGy. PROCEDURE: The procedure, risks, benefits, and alternatives were explained to the patient. Questions regarding the procedure were encouraged and answered. The patient understands and consents to the procedure. A timeout was performed prior to initiating the procedure. The right neck and chest were prepped with chlorhexidine in a sterile fashion, and a sterile drape was applied covering the operative field. Maximum barrier sterile technique with sterile gowns and gloves were used for the procedure. Local anesthesia was provided with 1% lidocaine. During the procedure the left neck and chest were also prepped with chlorhexidine and draped. Ultrasound was performed of the right internal jugular vein and a permanent ultrasound image was recorded and saved. Initial puncture  of the right internal jugular vein was performed under ultrasound guidance. Guidewire passage was attempted under fluoroscopy. Needle and wire were removed. Ultrasound was performed of the left internal jugular vein and a permanent ultrasound image was recorded and saved. After creating  a small venotomy incision, a 21 gauge needle was advanced into the left internal jugular vein under direct, real-time ultrasound guidance. Ultrasound image documentation was performed. After securing guidewire access, an 8 Fr dilator was placed. A J-wire was kinked to measure appropriate catheter length. A Palindrome tunneled hemodialysis catheter measuring 23 cm from tip to cuff was chosen for placement. This was tunneled in a retrograde fashion from the chest wall to the venotomy incision. At the venotomy, serial dilatation was performed and a 15 Fr peel-away sheath was placed over a guidewire. The catheter was then placed through the sheath and the sheath removed. Final catheter positioning was confirmed and documented with a fluoroscopic spot image. The catheter was aspirated, flushed with saline, and injected with appropriate volume heparin dwells. The venotomy incision was closed with subcuticular 4-0 Vicryl. Dermabond was applied to the incision. The catheter exit site was secured with 0-Prolene retention sutures. COMPLICATIONS: None.  No pneumothorax. FINDINGS: Initial ultrasound demonstrates thrombus in the right internal jugular vein. Due to what appear to be initially slow flow, access of the vein was attempted but a guidewire would not pass below the clavicle. Additional ultrasound demonstrates occlusive and chronic appearing thrombus in the right internal jugular vein related to prior temporary dialysis catheter placement. The left internal jugular vein is normally patent. A tunneled dialysis catheter was placed via the left internal jugular vein successfully. After catheter placement, the tip lies in the right atrium.  The catheter aspirates normally and is ready for immediate use. IMPRESSION: 1. 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 2. Placement of tunneled hemodialysis catheter via the left internal jugular vein. The catheter tip lies in the right atrium. The catheter is ready for immediate use. Electronically Signed   By: Aletta Edouard M.D.   On: 05/04/2022 15:15   IR US Guide Vasc Access Left  Result Date: 05/04/2022 CLINICAL DATA:  End-stage renal disease and need for tunneled hemodialysis catheter. Recent placement and removal of temporary dialysis catheter via right internal jugular vein. EXAM: TUNNELED CENTRAL VENOUS HEMODIALYSIS CATHETER PLACEMENT WITH ULTRASOUND AND FLUOROSCOPIC GUIDANCE ANESTHESIA/SEDATION: Moderate (conscious) sedation was employed during this procedure. A total of Versed 1.5 mg and Fentanyl 50 mcg was administered intravenously by radiology nursing. Moderate Sedation Time: 47 minutes. The patient's level of consciousness and vital signs were monitored continuously by radiology nursing throughout the procedure under my direct supervision. MEDICATIONS: 2 g IV Ancef. FLUOROSCOPY: 3 minutes and 42 seconds.  282 mGy. PROCEDURE: The procedure, risks, benefits, and alternatives were explained to the patient. Questions regarding the procedure were encouraged and answered. The patient understands and consents to the procedure. A timeout was performed prior to initiating the procedure. The right neck and chest were prepped with chlorhexidine in a sterile fashion, and a sterile drape was applied covering the operative field. Maximum barrier sterile technique with sterile gowns and gloves were used for the procedure. Local anesthesia was provided with 1% lidocaine. During the procedure the left neck and chest were also prepped with chlorhexidine and draped. Ultrasound was performed of the right internal jugular vein and a  permanent ultrasound image was recorded and saved. Initial puncture of the right internal jugular vein was performed under ultrasound guidance. Guidewire passage was attempted under fluoroscopy. Needle and wire were removed. Ultrasound was performed of the left internal jugular vein and a permanent ultrasound image was recorded and saved. After creating a small venotomy incision, a 21 gauge  needle was advanced into the left internal jugular vein under direct, real-time ultrasound guidance. Ultrasound image documentation was performed. After securing guidewire access, an 8 Fr dilator was placed. A J-wire was kinked to measure appropriate catheter length. A Palindrome tunneled hemodialysis catheter measuring 23 cm from tip to cuff was chosen for placement. This was tunneled in a retrograde fashion from the chest wall to the venotomy incision. At the venotomy, serial dilatation was performed and a 15 Fr peel-away sheath was placed over a guidewire. The catheter was then placed through the sheath and the sheath removed. Final catheter positioning was confirmed and documented with a fluoroscopic spot image. The catheter was aspirated, flushed with saline, and injected with appropriate volume heparin dwells. The venotomy incision was closed with subcuticular 4-0 Vicryl. Dermabond was applied to the incision. The catheter exit site was secured with 0-Prolene retention sutures. COMPLICATIONS: None.  No pneumothorax. FINDINGS: Initial ultrasound demonstrates thrombus in the right internal jugular vein. Due to what appear to be initially slow flow, access of the vein was attempted but a guidewire would not pass below the clavicle. Additional ultrasound demonstrates occlusive and chronic appearing thrombus in the right internal jugular vein related to prior temporary dialysis catheter placement. The left internal jugular vein is normally patent. A tunneled dialysis catheter was placed via the left internal jugular vein  successfully. After catheter placement, the tip lies in the right atrium. The catheter aspirates normally and is ready for immediate use. IMPRESSION: 1. 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 2. Placement of tunneled hemodialysis catheter via the left internal jugular vein. The catheter tip lies in the right atrium. The catheter is ready for immediate use. Electronically Signed   By: Aletta Edouard M.D.   On: 05/04/2022 15:15   DG Abd Portable 1V  Result Date: 05/04/2022 CLINICAL DATA:  Feeding tube placement EXAM: PORTABLE ABDOMEN - 1 VIEW COMPARISON:  Radiograph 05/02/2019 FINDINGS: Feeding tube tip overlies the distal descending duodenum, retracted since the prior exam. Nonobstructive bowel gas pattern. IMPRESSION: Feeding tube tip overlies the distal descending duodenum, retracted since the prior exam. Electronically Signed   By: Maurine Simmering M.D.   On: 05/04/2022 11:29    Micro Results   Recent Results (from the past 240 hour(s))  Urine Culture     Status: None   Collection Time: 05/29/22  7:56 AM   Specimen: Urine, Clean Catch  Result Value Ref Range Status   Specimen Description   Final    URINE, CLEAN CATCH Performed at [facility], [address], [place], [place] [address]    Special Requests   Final    NONE Performed at [facility], [address], [place], [place] [address]    Culture   Final    NO GROWTH Performed at [facility], [facility] [address] [place],  [address]    Report Status 05/31/2022 FINAL  Final    Today   Subjective    Cinch Ormond today has no   new complaints, -Voiding okay Had BM No fever  Or chills   No Nausea, Vomiting or Diarrhea   Patient has been seen and examined prior to discharge   Objective   Blood pressure (!) 142/89, pulse 98, temperature 98 F (36.7 C), temperature source Oral, resp. rate 18, height '5\' 8"'$  (1.727 m), weight 114.5 kg, SpO2  93 %.  No intake or output data in the 24 hours ending  06/01/22 1551  Exam Gen:- Awake Alert,  in no apparent distress  HEENT:- [place].AT, No sclera icterus Neck-Supple Neck, Lt IJ HD catheter Lungs-  CTAB , fair symmetrical air movement CV- S1, S2 normal, regular  Abd-  +ve B.Sounds, Abd Soft, No tenderness,    Extremity/Skin:- No  edema, pedal pulses present  Psych-affect is appropriate, oriented x3 Neuro-generalized weakness, no new focal deficits, no tremors   Data Review   CBC w Diff:  Lab Results  Component Value Date   WBC 11.6 (H) 05/31/2022   HGB 9.1 (L) 06/01/2022   HGB 14.0 03/16/2022   HCT 29.4 (L) 06/01/2022   HCT 42.4 03/16/2022   PLT 262 05/31/2022   PLT 258 03/16/2022   LYMPHOPCT 8 05/27/2022   MONOPCT 9 05/27/2022   EOSPCT 3 05/27/2022   BASOPCT 0 05/27/2022    CMP:  Lab Results  Component Value Date   NA 133 (L) 05/31/2022   NA 134 03/16/2022   K 3.8 05/31/2022   CL 92 (L) 05/31/2022   CO2 26 05/31/2022   BUN 31 (H) 05/31/2022   BUN 16 03/16/2022   CREATININE 9.46 (H) 05/31/2022   PROT 7.8 05/29/2022   PROT 9.2 (H) 03/16/2022   ALBUMIN 3.0 (L) 05/29/2022   ALBUMIN 4.9 03/16/2022   BILITOT 0.7 05/29/2022   BILITOT 0.4 03/16/2022   ALKPHOS 103 05/29/2022   AST 21 05/29/2022   ALT 15 05/29/2022  .  Total Discharge time is about 33 minutes  Roxan Hockey M.D on 06/01/2022 at 3:51 PM  Go to [email or web address] -  for contact info  [facility] - Office  [phone]

## 2022-06-01 NOTE — Progress Notes (Signed)
Pt refused CPAP machine for tonight, machine on standby, instruct patient to call if he changes his mind

## 2022-06-02 DIAGNOSIS — E1110 Type 2 diabetes mellitus with ketoacidosis without coma: Secondary | ICD-10-CM | POA: Diagnosis not present

## 2022-06-02 DIAGNOSIS — D649 Anemia, unspecified: Secondary | ICD-10-CM | POA: Diagnosis not present

## 2022-06-02 DIAGNOSIS — D689 Coagulation defect, unspecified: Secondary | ICD-10-CM | POA: Diagnosis not present

## 2022-06-02 DIAGNOSIS — I5032 Chronic diastolic (congestive) heart failure: Secondary | ICD-10-CM | POA: Diagnosis not present

## 2022-06-02 DIAGNOSIS — R262 Difficulty in walking, not elsewhere classified: Secondary | ICD-10-CM | POA: Diagnosis not present

## 2022-06-02 DIAGNOSIS — K859 Acute pancreatitis without necrosis or infection, unspecified: Secondary | ICD-10-CM | POA: Diagnosis not present

## 2022-06-02 DIAGNOSIS — N186 End stage renal disease: Secondary | ICD-10-CM | POA: Diagnosis not present

## 2022-06-02 DIAGNOSIS — E039 Hypothyroidism, unspecified: Secondary | ICD-10-CM | POA: Diagnosis not present

## 2022-06-02 DIAGNOSIS — R2689 Other abnormalities of gait and mobility: Secondary | ICD-10-CM | POA: Diagnosis not present

## 2022-06-02 DIAGNOSIS — N178 Other acute kidney failure: Secondary | ICD-10-CM | POA: Diagnosis not present

## 2022-06-02 DIAGNOSIS — Z23 Encounter for immunization: Secondary | ICD-10-CM | POA: Diagnosis not present

## 2022-06-02 DIAGNOSIS — I959 Hypotension, unspecified: Secondary | ICD-10-CM | POA: Diagnosis not present

## 2022-06-02 DIAGNOSIS — Z992 Dependence on renal dialysis: Secondary | ICD-10-CM | POA: Diagnosis not present

## 2022-06-02 DIAGNOSIS — M6281 Muscle weakness (generalized): Secondary | ICD-10-CM | POA: Diagnosis not present

## 2022-06-02 DIAGNOSIS — N179 Acute kidney failure, unspecified: Secondary | ICD-10-CM | POA: Diagnosis not present

## 2022-06-02 DIAGNOSIS — E119 Type 2 diabetes mellitus without complications: Secondary | ICD-10-CM | POA: Diagnosis not present

## 2022-06-02 DIAGNOSIS — R52 Pain, unspecified: Secondary | ICD-10-CM | POA: Diagnosis not present

## 2022-06-03 DIAGNOSIS — M6281 Muscle weakness (generalized): Secondary | ICD-10-CM | POA: Diagnosis not present

## 2022-06-03 DIAGNOSIS — R262 Difficulty in walking, not elsewhere classified: Secondary | ICD-10-CM | POA: Diagnosis not present

## 2022-06-03 DIAGNOSIS — N186 End stage renal disease: Secondary | ICD-10-CM | POA: Diagnosis not present

## 2022-06-03 DIAGNOSIS — R2689 Other abnormalities of gait and mobility: Secondary | ICD-10-CM | POA: Diagnosis not present

## 2022-06-04 DIAGNOSIS — R52 Pain, unspecified: Secondary | ICD-10-CM | POA: Diagnosis not present

## 2022-06-04 DIAGNOSIS — D649 Anemia, unspecified: Secondary | ICD-10-CM | POA: Diagnosis not present

## 2022-06-04 DIAGNOSIS — R2689 Other abnormalities of gait and mobility: Secondary | ICD-10-CM | POA: Diagnosis not present

## 2022-06-04 DIAGNOSIS — E039 Hypothyroidism, unspecified: Secondary | ICD-10-CM | POA: Diagnosis not present

## 2022-06-04 DIAGNOSIS — N186 End stage renal disease: Secondary | ICD-10-CM | POA: Diagnosis not present

## 2022-06-04 DIAGNOSIS — M6281 Muscle weakness (generalized): Secondary | ICD-10-CM | POA: Diagnosis not present

## 2022-06-04 DIAGNOSIS — Z23 Encounter for immunization: Secondary | ICD-10-CM | POA: Diagnosis not present

## 2022-06-04 DIAGNOSIS — D689 Coagulation defect, unspecified: Secondary | ICD-10-CM | POA: Diagnosis not present

## 2022-06-04 DIAGNOSIS — N178 Other acute kidney failure: Secondary | ICD-10-CM | POA: Diagnosis not present

## 2022-06-04 DIAGNOSIS — R262 Difficulty in walking, not elsewhere classified: Secondary | ICD-10-CM | POA: Diagnosis not present

## 2022-06-04 DIAGNOSIS — Z992 Dependence on renal dialysis: Secondary | ICD-10-CM | POA: Diagnosis not present

## 2022-06-05 DIAGNOSIS — M6281 Muscle weakness (generalized): Secondary | ICD-10-CM | POA: Diagnosis not present

## 2022-06-05 DIAGNOSIS — N186 End stage renal disease: Secondary | ICD-10-CM | POA: Diagnosis not present

## 2022-06-05 DIAGNOSIS — R262 Difficulty in walking, not elsewhere classified: Secondary | ICD-10-CM | POA: Diagnosis not present

## 2022-06-05 DIAGNOSIS — R2689 Other abnormalities of gait and mobility: Secondary | ICD-10-CM | POA: Diagnosis not present

## 2022-06-07 DIAGNOSIS — N186 End stage renal disease: Secondary | ICD-10-CM | POA: Diagnosis not present

## 2022-06-07 DIAGNOSIS — R52 Pain, unspecified: Secondary | ICD-10-CM | POA: Diagnosis not present

## 2022-06-07 DIAGNOSIS — D689 Coagulation defect, unspecified: Secondary | ICD-10-CM | POA: Diagnosis not present

## 2022-06-07 DIAGNOSIS — N178 Other acute kidney failure: Secondary | ICD-10-CM | POA: Diagnosis not present

## 2022-06-07 DIAGNOSIS — R262 Difficulty in walking, not elsewhere classified: Secondary | ICD-10-CM | POA: Diagnosis not present

## 2022-06-07 DIAGNOSIS — R2689 Other abnormalities of gait and mobility: Secondary | ICD-10-CM | POA: Diagnosis not present

## 2022-06-07 DIAGNOSIS — M6281 Muscle weakness (generalized): Secondary | ICD-10-CM | POA: Diagnosis not present

## 2022-06-07 DIAGNOSIS — Z992 Dependence on renal dialysis: Secondary | ICD-10-CM | POA: Diagnosis not present

## 2022-06-07 DIAGNOSIS — E1122 Type 2 diabetes mellitus with diabetic chronic kidney disease: Secondary | ICD-10-CM | POA: Diagnosis not present

## 2022-06-07 DIAGNOSIS — E039 Hypothyroidism, unspecified: Secondary | ICD-10-CM | POA: Diagnosis not present

## 2022-06-07 DIAGNOSIS — D649 Anemia, unspecified: Secondary | ICD-10-CM | POA: Diagnosis not present

## 2022-06-08 DIAGNOSIS — R262 Difficulty in walking, not elsewhere classified: Secondary | ICD-10-CM | POA: Diagnosis not present

## 2022-06-08 DIAGNOSIS — R2689 Other abnormalities of gait and mobility: Secondary | ICD-10-CM | POA: Diagnosis not present

## 2022-06-08 DIAGNOSIS — M6281 Muscle weakness (generalized): Secondary | ICD-10-CM | POA: Diagnosis not present

## 2022-06-08 DIAGNOSIS — N186 End stage renal disease: Secondary | ICD-10-CM | POA: Diagnosis not present

## 2022-06-09 ENCOUNTER — Observation Stay (HOSPITAL_COMMUNITY)
Admission: EM | Admit: 2022-06-09 | Discharge: 2022-06-12 | Disposition: A | Discharge status: Skilled Nursing Facility | Payer: Medicare Other | Attending: Internal Medicine | Admitting: Internal Medicine

## 2022-06-09 ENCOUNTER — Encounter (HOSPITAL_COMMUNITY): Payer: Self-pay | Admitting: Emergency Medicine

## 2022-06-09 ENCOUNTER — Encounter: Payer: Medicare Other | Admitting: Vascular Surgery

## 2022-06-09 ENCOUNTER — Emergency Department (HOSPITAL_COMMUNITY): Payer: Medicare Other

## 2022-06-09 DIAGNOSIS — R2689 Other abnormalities of gait and mobility: Secondary | ICD-10-CM | POA: Diagnosis not present

## 2022-06-09 DIAGNOSIS — Z7982 Long term (current) use of aspirin: Secondary | ICD-10-CM | POA: Insufficient documentation

## 2022-06-09 DIAGNOSIS — D689 Coagulation defect, unspecified: Secondary | ICD-10-CM | POA: Diagnosis not present

## 2022-06-09 DIAGNOSIS — Z992 Dependence on renal dialysis: Secondary | ICD-10-CM | POA: Diagnosis not present

## 2022-06-09 DIAGNOSIS — J984 Other disorders of lung: Secondary | ICD-10-CM | POA: Diagnosis not present

## 2022-06-09 DIAGNOSIS — E669 Obesity, unspecified: Secondary | ICD-10-CM | POA: Diagnosis present

## 2022-06-09 DIAGNOSIS — N186 End stage renal disease: Secondary | ICD-10-CM | POA: Diagnosis not present

## 2022-06-09 DIAGNOSIS — Z79899 Other long term (current) drug therapy: Secondary | ICD-10-CM | POA: Diagnosis not present

## 2022-06-09 DIAGNOSIS — N179 Acute kidney failure, unspecified: Secondary | ICD-10-CM | POA: Diagnosis not present

## 2022-06-09 DIAGNOSIS — Z87891 Personal history of nicotine dependence: Secondary | ICD-10-CM | POA: Insufficient documentation

## 2022-06-09 DIAGNOSIS — Y841 Kidney dialysis as the cause of abnormal reaction of the patient, or of later complication, without mention of misadventure at the time of the procedure: Secondary | ICD-10-CM | POA: Diagnosis not present

## 2022-06-09 DIAGNOSIS — G8929 Other chronic pain: Secondary | ICD-10-CM | POA: Diagnosis not present

## 2022-06-09 DIAGNOSIS — E785 Hyperlipidemia, unspecified: Secondary | ICD-10-CM | POA: Diagnosis not present

## 2022-06-09 DIAGNOSIS — N178 Other acute kidney failure: Secondary | ICD-10-CM | POA: Diagnosis not present

## 2022-06-09 DIAGNOSIS — I959 Hypotension, unspecified: Secondary | ICD-10-CM | POA: Diagnosis not present

## 2022-06-09 DIAGNOSIS — I120 Hypertensive chronic kidney disease with stage 5 chronic kidney disease or end stage renal disease: Secondary | ICD-10-CM | POA: Insufficient documentation

## 2022-06-09 DIAGNOSIS — K5909 Other constipation: Secondary | ICD-10-CM | POA: Insufficient documentation

## 2022-06-09 DIAGNOSIS — R262 Difficulty in walking, not elsewhere classified: Secondary | ICD-10-CM | POA: Diagnosis not present

## 2022-06-09 DIAGNOSIS — T829XXA Unspecified complication of cardiac and vascular prosthetic device, implant and graft, initial encounter: Secondary | ICD-10-CM

## 2022-06-09 DIAGNOSIS — Z794 Long term (current) use of insulin: Secondary | ICD-10-CM | POA: Insufficient documentation

## 2022-06-09 DIAGNOSIS — E039 Hypothyroidism, unspecified: Secondary | ICD-10-CM | POA: Diagnosis not present

## 2022-06-09 DIAGNOSIS — R52 Pain, unspecified: Secondary | ICD-10-CM | POA: Diagnosis not present

## 2022-06-09 DIAGNOSIS — K859 Acute pancreatitis without necrosis or infection, unspecified: Secondary | ICD-10-CM | POA: Diagnosis not present

## 2022-06-09 DIAGNOSIS — J449 Chronic obstructive pulmonary disease, unspecified: Secondary | ICD-10-CM | POA: Diagnosis not present

## 2022-06-09 DIAGNOSIS — D631 Anemia in chronic kidney disease: Secondary | ICD-10-CM | POA: Insufficient documentation

## 2022-06-09 DIAGNOSIS — E1122 Type 2 diabetes mellitus with diabetic chronic kidney disease: Secondary | ICD-10-CM | POA: Insufficient documentation

## 2022-06-09 DIAGNOSIS — I5032 Chronic diastolic (congestive) heart failure: Secondary | ICD-10-CM | POA: Diagnosis not present

## 2022-06-09 DIAGNOSIS — T82818D Embolism of vascular prosthetic devices, implants and grafts, subsequent encounter: Principal | ICD-10-CM | POA: Insufficient documentation

## 2022-06-09 DIAGNOSIS — E1110 Type 2 diabetes mellitus with ketoacidosis without coma: Secondary | ICD-10-CM | POA: Diagnosis not present

## 2022-06-09 DIAGNOSIS — E119 Type 2 diabetes mellitus without complications: Secondary | ICD-10-CM

## 2022-06-09 DIAGNOSIS — D649 Anemia, unspecified: Secondary | ICD-10-CM | POA: Diagnosis not present

## 2022-06-09 DIAGNOSIS — Z96643 Presence of artificial hip joint, bilateral: Secondary | ICD-10-CM | POA: Insufficient documentation

## 2022-06-09 DIAGNOSIS — G4733 Obstructive sleep apnea (adult) (pediatric): Secondary | ICD-10-CM | POA: Diagnosis not present

## 2022-06-09 DIAGNOSIS — Z6835 Body mass index (BMI) 35.0-35.9, adult: Secondary | ICD-10-CM | POA: Diagnosis not present

## 2022-06-09 DIAGNOSIS — T8249XA Other complication of vascular dialysis catheter, initial encounter: Secondary | ICD-10-CM | POA: Diagnosis present

## 2022-06-09 DIAGNOSIS — M6281 Muscle weakness (generalized): Secondary | ICD-10-CM | POA: Diagnosis not present

## 2022-06-09 LAB — BASIC METABOLIC PANEL
Anion gap: 14 (ref 5–15)
BUN: 27 mg/dL — ABNORMAL HIGH (ref 8–23)
CO2: 26 mmol/L (ref 22–32)
Calcium: 9.8 mg/dL (ref 8.9–10.3)
Chloride: 99 mmol/L (ref 98–111)
Creatinine, Ser: 6.1 mg/dL — ABNORMAL HIGH (ref 0.61–1.24)
GFR, Estimated: 10 mL/min — ABNORMAL LOW (ref >60)
Glucose, Bld: 119 mg/dL — ABNORMAL HIGH (ref 70–99)
Potassium: 4.1 mmol/L (ref 3.5–5.1)
Sodium: 139 mmol/L (ref 135–145)

## 2022-06-09 LAB — CBC WITH DIFFERENTIAL/PLATELET
Abs Immature Granulocytes: 0.05 10*3/uL (ref 0.00–0.07)
Basophils Absolute: 0.1 10*3/uL (ref 0.0–0.1)
Basophils Relative: 0 %
Eosinophils Absolute: 0.3 10*3/uL (ref 0.0–0.5)
Eosinophils Relative: 2 %
HCT: 27.6 % — ABNORMAL LOW (ref 39.0–52.0)
Hemoglobin: 8.4 g/dL — ABNORMAL LOW (ref 13.0–17.0)
Immature Granulocytes: 0 %
Lymphocytes Relative: 10 %
Lymphs Abs: 1.3 10*3/uL (ref 0.7–4.0)
MCH: 27.8 pg (ref 26.0–34.0)
MCHC: 30.4 g/dL (ref 30.0–36.0)
MCV: 91.4 fL (ref 80.0–100.0)
Monocytes Absolute: 0.6 10*3/uL (ref 0.1–1.0)
Monocytes Relative: 5 %
Neutro Abs: 10.4 10*3/uL — ABNORMAL HIGH (ref 1.7–7.7)
Neutrophils Relative %: 83 %
Platelets: 343 10*3/uL (ref 150–400)
RBC: 3.02 MIL/uL — ABNORMAL LOW (ref 4.22–5.81)
RDW: 17.1 % — ABNORMAL HIGH (ref 11.5–15.5)
WBC: 12.7 10*3/uL — ABNORMAL HIGH (ref 4.0–10.5)
nRBC: 0 % (ref 0.0–0.2)

## 2022-06-09 NOTE — ED Provider Triage Note (Signed)
Emergency Medicine Provider Triage Evaluation Note  Mathew Lara , a 61 y.o. male  was evaluated in triage.  Pt complains of dialysis catheter not working.  Recently started on ESRD, Monday Wednesday Friday during prior hospitalization little over a month ago.  Went to dialysis today and his tunneled dialysis cath was left upper chest was not flushing.  He subsequently sent him here.  States he has been otherwise well since.  Does occasionally make some urine.  Review of Systems  Positive: Dialysis tunneled cath not working Negative:   Physical Exam  There were no vitals taken for this visit. Gen:   Awake, no distress   Resp:  Normal effort  Chest:  Dialysis catheter without surrounding erythema or warmth MSK:   Moves extremities without difficulty  Other:    Medical Decision Making  Medically screening exam initiated at 3:21 PM.  Appropriate orders placed.  Mathew Lara was informed that the remainder of the evaluation will be completed by another provider, this initial triage assessment does not replace that evaluation, and the importance of remaining in the ED until their evaluation is complete.  Dialysis catheter malfunctioning   Daimon Kean A, PA-C 06/09/22 1523

## 2022-06-09 NOTE — ED Notes (Signed)
NA x4 vitals 

## 2022-06-09 NOTE — ED Triage Notes (Signed)
Patient sent to ED from dialysis clinic due to being unable to flush his tunneled dialysis catheter. Patient is alert, oriented, and in no apparent distress at this time.

## 2022-06-10 ENCOUNTER — Encounter (HOSPITAL_COMMUNITY): Payer: Self-pay | Admitting: Internal Medicine

## 2022-06-10 DIAGNOSIS — T8249XA Other complication of vascular dialysis catheter, initial encounter: Secondary | ICD-10-CM | POA: Diagnosis present

## 2022-06-10 LAB — RENAL FUNCTION PANEL
Albumin: 3.2 g/dL — ABNORMAL LOW (ref 3.5–5.0)
Anion gap: 14 (ref 5–15)
BUN: 32 mg/dL — ABNORMAL HIGH (ref 8–23)
CO2: 23 mmol/L (ref 22–32)
Calcium: 9.7 mg/dL (ref 8.9–10.3)
Chloride: 100 mmol/L (ref 98–111)
Creatinine, Ser: 5.91 mg/dL — ABNORMAL HIGH (ref 0.61–1.24)
GFR, Estimated: 10 mL/min — ABNORMAL LOW (ref >60)
Glucose, Bld: 89 mg/dL (ref 70–99)
Phosphorus: 4.6 mg/dL (ref 2.5–4.6)
Potassium: 4.2 mmol/L (ref 3.5–5.1)
Sodium: 137 mmol/L (ref 135–145)

## 2022-06-10 LAB — HEMOGLOBIN A1C
Hgb A1c MFr Bld: 6.8 % — ABNORMAL HIGH (ref 4.8–5.6)
Mean Plasma Glucose: 148.46 mg/dL

## 2022-06-10 LAB — GLUCOSE, CAPILLARY
Glucose-Capillary: 91 mg/dL (ref 70–99)
Glucose-Capillary: 103 mg/dL — ABNORMAL HIGH (ref 70–99)

## 2022-06-10 LAB — CBG MONITORING, ED
Glucose-Capillary: 90 mg/dL (ref 70–99)
Glucose-Capillary: 94 mg/dL (ref 70–99)

## 2022-06-10 MED ORDER — ACETAMINOPHEN 325 MG PO TABS
650.0000 mg | ORAL_TABLET | Freq: Four times a day (QID) | ORAL | Status: DC | PRN
  Administered 2022-06-10: 650 mg via ORAL
  Filled 2022-06-10 (×2): qty 2

## 2022-06-10 MED ORDER — GUAIFENESIN 100 MG/5ML PO LIQD: 5.0000 mL | ORAL | Status: DC | PRN

## 2022-06-10 MED ORDER — ONE-DAILY MULTI VITAMINS PO TABS: 1.0000 | ORAL_TABLET | Freq: Every day | ORAL | Status: DC

## 2022-06-10 MED ORDER — INSULIN ASPART 100 UNIT/ML IJ SOLN: 0.0000 [IU] | INTRAMUSCULAR | Status: DC

## 2022-06-10 MED ORDER — HYDRALAZINE HCL 20 MG/ML IJ SOLN: 10.0000 mg | INTRAMUSCULAR | Status: DC | PRN

## 2022-06-10 MED ORDER — ASPIRIN 81 MG PO TBEC
81.0000 mg | DELAYED_RELEASE_TABLET | Freq: Every day | ORAL | Status: DC
  Administered 2022-06-11 – 2022-06-12 (×2): 81 mg via ORAL
  Filled 2022-06-10 (×2): qty 1

## 2022-06-10 MED ORDER — SEVELAMER CARBONATE 800 MG PO TABS
2400.0000 mg | ORAL_TABLET | Freq: Three times a day (TID) | ORAL | Status: DC
  Administered 2022-06-11 – 2022-06-12 (×3): 2400 mg via ORAL
  Filled 2022-06-10 (×3): qty 3

## 2022-06-10 MED ORDER — RENA-VITE PO TABS
1.0000 | ORAL_TABLET | Freq: Every day | ORAL | Status: DC
  Administered 2022-06-10 – 2022-06-11 (×2): 1 via ORAL
  Filled 2022-06-10 (×2): qty 1

## 2022-06-10 MED ORDER — TRAZODONE HCL 50 MG PO TABS
50.0000 mg | ORAL_TABLET | Freq: Every evening | ORAL | Status: DC | PRN
  Administered 2022-06-10 – 2022-06-11 (×2): 50 mg via ORAL
  Filled 2022-06-10 (×2): qty 1

## 2022-06-10 MED ORDER — HEPARIN SODIUM (PORCINE) 5000 UNIT/ML IJ SOLN
5000.0000 [IU] | Freq: Three times a day (TID) | INTRAMUSCULAR | Status: DC
  Administered 2022-06-10 – 2022-06-12 (×4): 5000 [IU] via SUBCUTANEOUS
  Filled 2022-06-10 (×5): qty 1

## 2022-06-10 MED ORDER — ACETAMINOPHEN 325 MG PO TABS: 650.0000 mg | ORAL_TABLET | Freq: Four times a day (QID) | ORAL | Status: DC | PRN

## 2022-06-10 MED ORDER — SODIUM CHLORIDE 0.9% FLUSH
3.0000 mL | Freq: Two times a day (BID) | INTRAVENOUS | Status: DC
  Administered 2022-06-10 – 2022-06-12 (×3): 3 mL via INTRAVENOUS

## 2022-06-10 MED ORDER — ACETAMINOPHEN 650 MG RE SUPP: 650.0000 mg | Freq: Four times a day (QID) | RECTAL | Status: DC | PRN

## 2022-06-10 MED ORDER — MIDODRINE HCL 5 MG PO TABS
10.0000 mg | ORAL_TABLET | Freq: Three times a day (TID) | ORAL | Status: DC
  Administered 2022-06-11 – 2022-06-12 (×4): 10 mg via ORAL
  Filled 2022-06-10 (×4): qty 2

## 2022-06-10 MED ORDER — ALBUTEROL SULFATE (2.5 MG/3ML) 0.083% IN NEBU: 2.5000 mg | INHALATION_SOLUTION | RESPIRATORY_TRACT | Status: DC | PRN

## 2022-06-10 MED ORDER — ARFORMOTEROL TARTRATE 15 MCG/2ML IN NEBU
15.0000 ug | INHALATION_SOLUTION | Freq: Two times a day (BID) | RESPIRATORY_TRACT | Status: DC
  Administered 2022-06-10 – 2022-06-12 (×4): 15 ug via RESPIRATORY_TRACT
  Filled 2022-06-10 (×4): qty 2

## 2022-06-10 MED ORDER — CHLORHEXIDINE GLUCONATE CLOTH 2 % EX PADS
6.0000 | MEDICATED_PAD | Freq: Every day | CUTANEOUS | Status: DC
  Administered 2022-06-11: 6 via TOPICAL

## 2022-06-10 MED ORDER — PANTOPRAZOLE SODIUM 40 MG PO TBEC
40.0000 mg | DELAYED_RELEASE_TABLET | Freq: Two times a day (BID) | ORAL | Status: DC
  Administered 2022-06-10 – 2022-06-12 (×3): 40 mg via ORAL
  Filled 2022-06-10 (×3): qty 1

## 2022-06-10 MED ORDER — FERROUS FUMARATE 324 (106 FE) MG PO TABS
1.0000 | ORAL_TABLET | Freq: Every day | ORAL | Status: DC
  Administered 2022-06-11 – 2022-06-12 (×2): 106 mg via ORAL
  Filled 2022-06-10 (×4): qty 1

## 2022-06-10 MED ORDER — ONDANSETRON HCL 4 MG/2ML IJ SOLN: 4.0000 mg | Freq: Four times a day (QID) | INTRAMUSCULAR | Status: DC | PRN

## 2022-06-10 MED ORDER — OXYCODONE HCL 5 MG PO TABS: 5.0000 mg | ORAL_TABLET | Freq: Three times a day (TID) | ORAL | Status: DC | PRN

## 2022-06-10 MED ORDER — SIMETHICONE 80 MG PO CHEW: 80.0000 mg | CHEWABLE_TABLET | ORAL | Status: DC | PRN

## 2022-06-10 MED ORDER — ONDANSETRON HCL 4 MG PO TABS: 4.0000 mg | ORAL_TABLET | Freq: Four times a day (QID) | ORAL | Status: DC | PRN

## 2022-06-10 MED ORDER — ATORVASTATIN CALCIUM 80 MG PO TABS
80.0000 mg | ORAL_TABLET | Freq: Every day | ORAL | Status: DC
  Administered 2022-06-10 – 2022-06-11 (×2): 80 mg via ORAL
  Filled 2022-06-10 (×2): qty 1

## 2022-06-10 MED ORDER — INSULIN ASPART 100 UNIT/ML IJ SOLN: 0.0000 [IU] | Freq: Three times a day (TID) | INTRAMUSCULAR | Status: DC

## 2022-06-10 MED ORDER — ZOLPIDEM TARTRATE 5 MG PO TABS
5.0000 mg | ORAL_TABLET | Freq: Every evening | ORAL | Status: DC | PRN
  Administered 2022-06-10 – 2022-06-11 (×2): 5 mg via ORAL
  Filled 2022-06-10 (×2): qty 1

## 2022-06-10 MED ORDER — SENNOSIDES-DOCUSATE SODIUM 8.6-50 MG PO TABS
2.0000 | ORAL_TABLET | Freq: Every day | ORAL | Status: DC
  Administered 2022-06-10 – 2022-06-11 (×2): 2 via ORAL
  Filled 2022-06-10 (×2): qty 2

## 2022-06-10 MED ORDER — IPRATROPIUM-ALBUTEROL 0.5-2.5 (3) MG/3ML IN SOLN: 3.0000 mL | RESPIRATORY_TRACT | Status: DC | PRN

## 2022-06-10 MED ORDER — METOPROLOL TARTRATE 5 MG/5ML IV SOLN: 5.0000 mg | INTRAVENOUS | Status: DC | PRN

## 2022-06-10 MED ORDER — CEFAZOLIN SODIUM-DEXTROSE 2-4 GM/100ML-% IV SOLN
2.0000 g | Freq: Once | INTRAVENOUS | Status: AC
  Administered 2022-06-10: 2 g via INTRAVENOUS
  Filled 2022-06-10: qty 100

## 2022-06-10 MED ORDER — POLYETHYLENE GLYCOL 3350 17 G PO PACK
17.0000 g | PACK | Freq: Two times a day (BID) | ORAL | Status: DC
  Administered 2022-06-10 – 2022-06-12 (×3): 17 g via ORAL
  Filled 2022-06-10 (×3): qty 1

## 2022-06-10 MED ORDER — QUETIAPINE FUMARATE 25 MG PO TABS
25.0000 mg | ORAL_TABLET | Freq: Every day | ORAL | Status: DC
  Administered 2022-06-10 – 2022-06-11 (×2): 25 mg via ORAL
  Filled 2022-06-10 (×2): qty 1

## 2022-06-10 NOTE — ED Provider Notes (Signed)
[facility] EMERGENCY DEPARTMENT Provider Note   CSN: [id number] Arrival date & time: 06/09/22  1428     History  Chief Complaint  Patient presents with   Vascular Access Problem    Mathew Lara is a 61 y.o. male.  Patient sent here from dialysis as his dialysis catheter is not working.  They tried to use heparin yesterday at dialysis where his left-sided tunneled catheter was not able to work despite heparin at times.  He has been having issues on and off with this catheter.  He denies any shortness of breath or chest pain or abdominal pain or nausea or vomiting.  Nothing makes it worse or better.  He was told that his nephrologist at his dialysis site had talked to nephrology here at [facility] and the plan was likely to get tunneled catheter replaced.  The history is provided by the patient.       Home Medications Prior to Admission medications   Medication Sig Start Date End Date Taking? Authorizing Provider  acetaminophen (TYLENOL) 325 MG tablet Take 2 tablets (650 mg total) by mouth every 6 (six) hours as needed for mild pain (or Fever >/= 101). 06/01/22   Emokpae, Courage, MD  albuterol (PROVENTIL) (2.5 MG/3ML) 0.083% nebulizer solution Take 3 mLs (2.5 mg total) by nebulization every 4 (four) hours as needed for wheezing or shortness of breath. 05/14/22   Elgergawy, Silver Huguenin, MD  arformoterol (BROVANA) 15 MCG/2ML NEBU Take 2 mLs (15 mcg total) by nebulization 2 (two) times daily. 05/14/22   Elgergawy, Silver Huguenin, MD  aspirin EC 81 MG tablet Take 1 tablet (81 mg total) by mouth daily with breakfast. 06/01/22   Roxan Hockey, MD  atorvastatin (LIPITOR) 80 MG tablet Take 1 tablet (80 mg total) by mouth daily. Patient taking differently: Take 80 mg by mouth at bedtime. 05/14/22   Elgergawy, Silver Huguenin, MD  B Complex-C-Zn-Folic Acid (DIALYVITE 350 WITH ZINC) 0.8 MG TABS Take 1 tablet by mouth daily.    [provider]  calcium carbonate (TUMS - DOSED IN MG  ELEMENTAL CALCIUM) 500 MG chewable tablet Chew 1 tablet (200 mg of elemental calcium total) by mouth every 8 (eight) hours as needed for indigestion or heartburn. 05/14/22   Elgergawy, Silver Huguenin, MD  ferrous fumarate (FERRETTS) 325 (106 Fe) MG TABS tablet Take 1 tablet by mouth daily.    [provider]  insulin aspart (NOVOLOG) 100 UNIT/ML injection Inject 1-3 Units into the skin 3 (three) times daily with meals. Patient taking differently: Inject 2-12 Units into the skin See admin instructions. Inject per sliding scale: If 201-250=2 units 251-300=4 units 301-350=6 units 351-400=8 units 401-450=10 units 451-500=12 units If reads high, give 14 units, recheck in 2 hours. Call MD if blood sugar is >400 or <10 05/14/22   Elgergawy, Silver Huguenin, MD  insulin detemir (LEVEMIR) 100 UNIT/ML injection Inject 0.08 mLs (8 Units total) into the skin 2 (two) times daily. 06/01/22   Roxan Hockey, MD  loratadine (CLARITIN) 10 MG tablet Take 1 tablet (10 mg total) by mouth daily. 09/02/19   Barton Dubois, MD  midodrine (PROAMATINE) 10 MG tablet Take 1 tablet (10 mg total) by mouth 3 (three) times daily with meals. 06/01/22   Roxan Hockey, MD  Multiple Vitamin (MULTIVITAMIN) tablet Take 1 tablet by mouth daily.    [provider]  multivitamin (RENA-VIT) TABS tablet Take 1 tablet by mouth at bedtime. Patient not taking: Reported on 05/28/2022 05/14/22   Elgergawy,  Silver Huguenin, MD  oxyCODONE (ROXICODONE) 5 MG immediate release tablet Take 1 tablet (5 mg total) by mouth every 8 (eight) hours as needed for severe pain. 06/01/22   Roxan Hockey, MD  OXYGEN Inhale 2 L into the lungs continuous. 05/14/22   [provider]  pantoprazole (PROTONIX) 40 MG tablet Take 1 tablet (40 mg total) by mouth 2 (two) times daily before a meal. 30 minutes before breakfast 03/08/22 09/04/22  Annitta Needs, NP  polyethylene glycol (MIRALAX / GLYCOLAX) 17 g packet Take 17 g by mouth 2 (two) times daily. 06/01/22    Roxan Hockey, MD  QUEtiapine (SEROQUEL) 25 MG tablet Take 1 tablet (25 mg total) by mouth at bedtime. 05/14/22   Elgergawy, Silver Huguenin, MD  revefenacin (YUPELRI) 175 MCG/3ML nebulizer solution Take 3 mLs (175 mcg total) by nebulization daily. Patient taking differently: Inhale 175 mcg into the lungs daily. 05/15/22   Elgergawy, Silver Huguenin, MD  senna-docusate (SENOKOT-S) 8.6-50 MG tablet Take 2 tablets by mouth at bedtime. 06/01/22   Roxan Hockey, MD  sevelamer carbonate (RENVELA) 800 MG tablet Take 3 tablets (2,400 mg total) by mouth 3 (three) times daily with meals. 05/14/22   Elgergawy, Silver Huguenin, MD  simethicone (MYLICON) 80 MG chewable tablet Chew 1 tablet (80 mg total) by mouth 4 (four) times daily. Patient taking differently: Chew 80 mg by mouth every 4 (four) hours as needed for flatulence. 05/14/22   Elgergawy, Silver Huguenin, MD  sodium chloride (OCEAN) 0.65 % SOLN nasal spray Place 1 spray into both nostrils as needed for congestion. Patient taking differently: Place 1 spray into both nostrils every 2 (two) hours as needed for congestion. 05/14/22   Elgergawy, Silver Huguenin, MD  zolpidem (AMBIEN) 5 MG tablet Take 1 tablet (5 mg total) by mouth at bedtime as needed for sleep. 06/01/22   Roxan Hockey, MD      Allergies    Patient has no known allergies.    Review of Systems   Review of Systems  Physical Exam Updated Vital Signs BP (!) 114/91   Pulse 93   Temp 98.6 F (37 C) (Oral)   Resp 15   SpO2 100%  Physical Exam Vitals and nursing note reviewed.  Constitutional:      General: He is not in acute distress.    Appearance: He is well-developed.  HENT:     Head: Normocephalic and atraumatic.  Eyes:     Extraocular Movements: Extraocular movements intact.     Conjunctiva/sclera: Conjunctivae normal.     Pupils: Pupils are equal, round, and reactive to light.  Cardiovascular:     Rate and Rhythm: Normal rate and regular rhythm.     Pulses: Normal pulses.     Heart sounds: No murmur  heard. Pulmonary:     Effort: Pulmonary effort is normal. No respiratory distress.     Breath sounds: Normal breath sounds.  Abdominal:     Palpations: Abdomen is soft.     Tenderness: There is no abdominal tenderness.  Musculoskeletal:        General: No swelling.     Cervical back: Neck supple.  Skin:    General: Skin is warm and dry.     Capillary Refill: Capillary refill takes less than 2 seconds.  Neurological:     General: No focal deficit present.     Mental Status: He is alert.  Psychiatric:        Mood and Affect: Mood normal.     ED  Results / Procedures / Treatments   Labs (all labs ordered are listed, but only abnormal results are displayed) Labs Reviewed  CBC WITH DIFFERENTIAL/PLATELET - Abnormal; Notable for the following components:      Result Value   WBC 12.7 (*)    RBC 3.02 (*)    Hemoglobin 8.4 (*)    HCT 27.6 (*)    RDW 17.1 (*)    Neutro Abs 10.4 (*)    All other components within normal limits  BASIC METABOLIC PANEL - Abnormal; Notable for the following components:   Glucose, Bld 119 (*)    BUN 27 (*)    Creatinine, Ser 6.10 (*)    GFR, Estimated 10 (*)    All other components within normal limits  BASIC METABOLIC PANEL  CBG MONITORING, ED  CBG MONITORING, ED    EKG EKG Interpretation  Date/Time:  Thursday June 10 2022 09:43:31 EDT Ventricular Rate:  92 PR Interval:  180 QRS Duration: 79 QT Interval:  368 QTC Calculation: 456 R Axis:   44 Text Interpretation: Sinus rhythm Confirmed by Lennice Sites ([id number]) on 06/10/2022 9:46:10 AM  Radiology DG Chest 2 View  Result Date: 06/09/2022 CLINICAL DATA:  Dialysis patient with malfunctioning catheter, unable to be dialyzed. EXAM: CHEST - 2 VIEW COMPARISON:  Chest radiograph and CT chest 05/27/2022 FINDINGS: The left-sided dialysis catheter is stable with the tip terminating in the right atrium. The catheter is intact. The cardiomediastinal silhouette is stable. The known thick-walled cystic  lesion in the left upper lobe is not well seen on the current study common better assessed on the CT chest from 05/27/2022. Aeration of the lungs is unchanged since the radiographs from 05/27/2022 with no new or worsening focal airspace disease. Linear opacities in the right midlung likely reflect scarring. There is no pulmonary edema. There is no pleural effusion or pneumothorax There is no acute osseous abnormality. IMPRESSION: 1. Stable left-sided dialysis catheter which appears intact. 2. Stable aeration of the lungs with no new or worsening focal airspace disease. The known thick-walled cavitary lesion in the left upper lobe is not well seen on the current study. Electronically Signed   By: Valetta Mole M.D.   On: 06/09/2022 16:16    Procedures Procedures    Medications Ordered in ED Medications  atorvastatin (LIPITOR) tablet 80 mg (has no administration in time range)  acetaminophen (TYLENOL) tablet 650 mg (has no administration in time range)  pantoprazole (PROTONIX) EC tablet 40 mg (has no administration in time range)  QUEtiapine (SEROQUEL) tablet 25 mg (has no administration in time range)  insulin aspart (novoLOG) injection 0-6 Units ( Subcutaneous Not Given 06/10/22 3419)    ED Course/ Medical Decision Making/ A&P                           Medical Decision Making Amount and/or Complexity of Data Reviewed Labs: ordered.  Risk OTC drugs. Prescription drug management. Decision regarding hospitalization.   [name] is here with tunneled dialysis catheter issue.  Was not able to get dialysis yesterday as his tunnel catheter is clogged.  They tried heparin at dialysis but unable to get catheter working.  Patient last had dialysis on Monday.  He is asymptomatic.  He had medical screening labs done prior to my evaluation with a CBC and BMP that showed no significant electrolyte abnormality.  He has a normal potassium.  Vital signs are unremarkable.  He is well-appearing.  Patient has been here waiting for 18 hours and will get a repeat BMP and talk with nephrology team.  Talked with Dr. Melvia Heaps with nephrology who is aware of the patient.  Right now does not need emergent dialysis.  We will recheck electrolytes.  I talked with both vascular surgery team and IR.  Currently vascular surgery is in the OR for couple hours, IR team thinks they can get to him likely later today as well.  Overall not sure if patient will be able to get tunneled catheter line today.  We will tentatively get him a hospital bed.  Assume if he is able to get his tunnel catheter/dialysis line replaced later today and electrolytes remain unremarkable and he remained stable to be discharged after that and follow-up with dialysis tomorrow.  At this time we will get him a hospital bed admit.  Currently IR team will place new catheter.  This chart was dictated using voice recognition software.  Despite best efforts to proofread,  errors can occur which can change the documentation meaning.         Final Clinical Impression(s) / ED Diagnoses Final diagnoses:  ESRD (end stage renal disease) ([place])  Complication of vascular access for dialysis, initial encounter    Rx / DC Orders ED Discharge Orders     None         Lennice Sites, DO 06/10/22 1002

## 2022-06-10 NOTE — Consult Note (Signed)
Chief Complaint: Patient was seen in consultation today for clotted HD access  Referring Physician(s): Dr. Reesa Chew  Supervising Physician: Juliet Rude  Patient Status: [facility] - ED  History of Present Illness: ROBEL WUERTZ is a 61 y.o. male with past medical history of alcohol abuse, anxiety, COPD, DM, Hep C who presented to [facility] with abdominal pain.  He was ultimately transferred to [facility] for management of DKA, acute pancreatitis, AKI which did not fully recover and he is now dialysis dependent.  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  A left IJ tunneled HD catheter was placed 8/29 by Dr. Kathlene Cote.  This too malfunctioned shortly after and was exchanged by Dr. Earleen Newport 05/13/22.  Patient states taht within the past month the kidney center has had to use cathflo at least twice on his catheter to restore function.  Yesterday at his center he was unable to get flow and therefore unable to get HD.  He was sent to the ED for line management.  IR consulted for exchange vs. New placement.    Patient assessed at bedside. He is alert, pleasant, answers all questions.  He is eating snacks at time of visit-- He was previously NPO.  He is understanding of the goals of the procedure and is agreeable to proceed.   Past Surgical History:  Procedure Laterality Date   BALLOON DILATION N/A 07/06/2021   Procedure: BALLOON DILATION;  Surgeon: [name], DO;  [facility];  Service: Endoscopy;  Laterality: N/A;   BIOPSY  07/06/2021   Procedure: BIOPSY;  Surgeon: [name], DO;  [facility];  Service: Endoscopy;;   CATARACT EXTRACTION Right    CHOLECYSTECTOMY N/A 03/23/2021   Procedure: LAPAROSCOPIC CHOLECYSTECTOMY;  Surgeon: [name], MD;  Location: [facility];  Service: General;  Laterality: N/A;   COLONOSCOPY N/A 01/23/2014   SLF:The LEFT COLON IS  EXTREMELY redundant/TWO RECTAL POLYPS REMOVED/SMALL VOLUME RECTAL BLEEDING MOST LIKELY DUE TO Small internal hemorrhoids. path with prolapse type polyp of benign-mucosa. no adenomas   COLONOSCOPY WITH PROPOFOL N/A 07/06/2021   non-bleeding internal hemorrhoids, one 8 mm polyp in sigmod s/p removal and clip placed. One 5 mm polyp at recto-sigmoid s/p removal. 3 year surveillance. Tubular adenomas   ESOPHAGOGASTRODUODENOSCOPY N/A 01/23/2014   WUJ:[id number] ring at the gastroesophageal junction/Medium sized hiatal hernia/NDYSPEPSIA MOST LIKELY DUE TO GERD/MILD Non-erosive gastritis   ESOPHAGOGASTRODUODENOSCOPY N/A 04/18/2018   food impaction, small hiatal hernia   ESOPHAGOGASTRODUODENOSCOPY (EGD) WITH PROPOFOL N/A 07/06/2021   moderate Schatzki ring s/p dilation, gastritis s/p biopsy, normal duodenum. Negative H.pylori.   HEMORRHOID BANDING  01/23/2014   Procedure: HEMORRHOID BANDING;  Surgeon: Danie Binder, MD;  [facility];  Service: Endoscopy;;   IR FLUORO GUIDE CV LINE LEFT  05/04/2022   IR FLUORO GUIDE CV LINE LEFT  05/13/2022   IR US GUIDE VASC ACCESS LEFT  05/04/2022   JOINT REPLACEMENT Right 2017   LIVER BIOPSY N/A 03/23/2021   Procedure: LAPAROSCOPIC LIVER BIOPSY;  Surgeon: [name], MD;  Location: [facility];  Service: General;  Laterality: N/A;   MOUTH SURGERY     POLYPECTOMY  07/06/2021   Procedure: POLYPECTOMY;  Surgeon: [name], DO;  Location[facility];  Service: Endoscopy;;   TOTAL HIP ARTHROPLASTY Right 06/04/2016   Procedure: RIGHT TOTAL HIP ARTHROPLASTY ANTERIOR APPROACH;  Surgeon: Mcarthur Rossetti, MD;  Location: [facility];  Service: Orthopedics;  Laterality: Right;   TOTAL HIP ARTHROPLASTY Left 03/02/2019   Procedure: LEFT TOTAL HIP ARTHROPLASTY ANTERIOR APPROACH;  Surgeon: Mcarthur Rossetti, MD;  Location: [facility];  Service: Orthopedics;  Laterality: Left;    Allergies: Patient has no known allergies.  Medications: Prior to  Admission medications   Medication Sig Start Date End Date Taking? Authorizing Provider  acetaminophen (TYLENOL) 325 MG tablet Take 2 tablets (650 mg total) by mouth every 6 (six) hours as needed for mild pain (or Fever >/= 101). 06/01/22   Emokpae, Courage, MD  albuterol (PROVENTIL) (2.5 MG/3ML) 0.083% nebulizer solution Take 3 mLs (2.5 mg total) by nebulization every 4 (four) hours as needed for wheezing or shortness of breath. 05/14/22   Elgergawy, Silver Huguenin, MD  arformoterol (BROVANA) 15 MCG/2ML NEBU Take 2 mLs (15 mcg total) by nebulization 2 (two) times daily. 05/14/22   Elgergawy, Silver Huguenin, MD  aspirin EC 81 MG tablet Take 1 tablet (81 mg total) by mouth daily with breakfast. 06/01/22   Roxan Hockey, MD  atorvastatin (LIPITOR) 80 MG tablet Take 1 tablet (80 mg total) by mouth daily. Patient taking differently: Take 80 mg by mouth at bedtime. 05/14/22   Elgergawy, Silver Huguenin, MD  B Complex-C-Zn-Folic Acid (DIALYVITE 161 WITH ZINC) 0.8 MG TABS Take 1 tablet by mouth daily.    [provider]  calcium carbonate (TUMS - DOSED IN MG ELEMENTAL CALCIUM) 500 MG chewable tablet Chew 1 tablet (200 mg of elemental calcium total) by mouth every 8 (eight) hours as needed for indigestion or heartburn. 05/14/22   Elgergawy, Silver Huguenin, MD  ferrous fumarate (FERRETTS) 325 (106 Fe) MG TABS tablet Take 1 tablet by mouth daily.    [provider]  insulin aspart (NOVOLOG) 100 UNIT/ML injection Inject 1-3 Units into the skin 3 (three) times daily with meals. Patient taking differently: Inject 2-12 Units into the skin See admin instructions. Inject per sliding scale: If 201-250=2 units 251-300=4 units 301-350=6 units 351-400=8 units 401-450=10 units 451-500=12 units If reads high, give 14 units, recheck in 2 hours. Call MD if blood sugar is >400 or <10 05/14/22   Elgergawy, Silver Huguenin, MD  insulin detemir (LEVEMIR) 100 UNIT/ML injection Inject 0.08 mLs (8 Units total) into the skin 2 (two) times daily.  06/01/22   Roxan Hockey, MD  loratadine (CLARITIN) 10 MG tablet Take 1 tablet (10 mg total) by mouth daily. 09/02/19   Barton Dubois, MD  midodrine (PROAMATINE) 10 MG tablet Take 1 tablet (10 mg total) by mouth 3 (three) times daily with meals. 06/01/22   Roxan Hockey, MD  Multiple Vitamin (MULTIVITAMIN) tablet Take 1 tablet by mouth daily.    [provider]  multivitamin (RENA-VIT) TABS tablet Take 1 tablet by mouth at bedtime. Patient not taking: Reported on 05/28/2022 05/14/22   Elgergawy, Silver Huguenin, MD  oxyCODONE (ROXICODONE) 5 MG immediate release tablet Take 1 tablet (5 mg total) by mouth every 8 (eight) hours as needed for severe pain. 06/01/22   Roxan Hockey, MD  OXYGEN Inhale 2 L into the lungs continuous. 05/14/22   [provider]  pantoprazole (PROTONIX) 40 MG tablet Take 1 tablet (40 mg total) by mouth 2 (two) times daily before a meal. 30 minutes before breakfast 03/08/22 09/04/22  Annitta Needs, NP  polyethylene glycol (MIRALAX / GLYCOLAX) 17 g packet Take 17 g by mouth 2 (two) times daily. 06/01/22   Emokpae, Courage,  MD  QUEtiapine (SEROQUEL) 25 MG tablet Take 1 tablet (25 mg total) by mouth at bedtime. 05/14/22   Elgergawy, Silver Huguenin, MD  revefenacin (YUPELRI) 175 MCG/3ML nebulizer solution Take 3 mLs (175 mcg total) by nebulization daily. Patient taking differently: Inhale 175 mcg into the lungs daily. 05/15/22   Elgergawy, Silver Huguenin, MD  senna-docusate (SENOKOT-S) 8.6-50 MG tablet Take 2 tablets by mouth at bedtime. 06/01/22   Roxan Hockey, MD  sevelamer carbonate (RENVELA) 800 MG tablet Take 3 tablets (2,400 mg total) by mouth 3 (three) times daily with meals. 05/14/22   Elgergawy, Silver Huguenin, MD  simethicone (MYLICON) 80 MG chewable tablet Chew 1 tablet (80 mg total) by mouth 4 (four) times daily. Patient taking differently: Chew 80 mg by mouth every 4 (four) hours as needed for flatulence. 05/14/22   Elgergawy, Silver Huguenin, MD  sodium chloride (OCEAN) 0.65 % SOLN nasal  spray Place 1 spray into both nostrils as needed for congestion. Patient taking differently: Place 1 spray into both nostrils every 2 (two) hours as needed for congestion. 05/14/22   Elgergawy, Silver Huguenin, MD  zolpidem (AMBIEN) 5 MG tablet Take 1 tablet (5 mg total) by mouth at bedtime as needed for sleep. 06/01/22   Roxan Hockey, MD     Family History  Problem Relation Age of Onset   Cancer Mother    Colon cancer Neg Hx    Colon polyps Neg Hx     Social History   Socioeconomic History   Marital status: Single    Spouse name: Not on file   Number of children: 1   Years of education: 10   Highest education level: Not on file  Occupational History    Comment: [employer]  Tobacco Use   Smoking status: Former    Packs/day: 1.00    Years: 42.00    Total pack years: 42.00    Types: Cigarettes    Start date: 09/07/1971    Quit date: 05/28/2014    Years since quitting: 8.0    Passive exposure: Past   Smokeless tobacco: Never   Tobacco comments:    quit in November 2015 after smoking x 20 yrs.  Vaping Use   Vaping Use: Never used  Substance and Sexual Activity   Alcohol use: Not Currently    Alcohol/week: 0.0 standard drinks of alcohol   Drug use: No   Sexual activity: Not on file  Other Topics Concern   Not on file  Social History Narrative       patient consumes 4 cups of caffeine daily.      Patient stopped drinking years ago.   Social Determinants of Health   Financial Resource Strain: Not on file  Food Insecurity: No Food Insecurity (05/28/2022)   Hunger Vital Sign    Worried About Running Out of Food in the Last Year: Never true    Ran Out of Food in the Last Year: Never true  Transportation Needs: No Transportation Needs (05/28/2022)   PRAPARE - Hydrologist (Medical): No    Lack of Transportation (Non-Medical): No  Physical Activity: Not on file  Stress: Not on file  Social Connections: Not on file     Review of Systems: A 12  point ROS discussed and pertinent positives are indicated in the HPI above.  All other systems are negative.  Review of Systems  Constitutional:  Negative for fatigue and fever.  Respiratory:  Negative for cough and shortness of breath.  Cardiovascular:  Negative for chest pain.  Gastrointestinal:  Negative for abdominal pain, nausea and vomiting.  Musculoskeletal:  Negative for back pain.  Psychiatric/Behavioral:  Negative for behavioral problems and confusion.     Vital Signs: BP (!) 148/100   Pulse 90   Temp 98.6 F (37 C) (Oral)   Resp 18   SpO2 99%   Physical Exam Vitals and nursing note reviewed.  Constitutional:      General: He is not in acute distress.    Appearance: Normal appearance. He is not ill-appearing.  HENT:     Mouth/Throat:     Mouth: Mucous membranes are moist.     Pharynx: Oropharynx is clear.  Cardiovascular:     Rate and Rhythm: Normal rate and regular rhythm.  Pulmonary:     Effort: Pulmonary effort is normal.     Breath sounds: Normal breath sounds.  Abdominal:     General: Abdomen is flat.     Palpations: Abdomen is soft.  Skin:    General: Skin is warm and dry.  Neurological:     General: No focal deficit present.     Mental Status: He is alert and oriented to person, place, and time. Mental status is at baseline.  Psychiatric:        Mood and Affect: Mood normal.        Behavior: Behavior normal.        Thought Content: Thought content normal.        Judgment: Judgment normal.      MD Evaluation Airway: WNL Heart: WNL Abdomen: WNL Chest/ Lungs: WNL ASA  Classification: 3 Mallampati/Airway Score: Two   Imaging: DG Chest 2 View  Result Date: 06/09/2022 CLINICAL DATA:  Dialysis patient with malfunctioning catheter, unable to be dialyzed. EXAM: CHEST - 2 VIEW COMPARISON:  Chest radiograph and CT chest 05/27/2022 FINDINGS: The left-sided dialysis catheter is stable with the tip terminating in the right atrium. The catheter is  intact. The cardiomediastinal silhouette is stable. The known thick-walled cystic lesion in the left upper lobe is not well seen on the current study common better assessed on the CT chest from 05/27/2022. Aeration of the lungs is unchanged since the radiographs from 05/27/2022 with no new or worsening focal airspace disease. Linear opacities in the right midlung likely reflect scarring. There is no pulmonary edema. There is no pleural effusion or pneumothorax There is no acute osseous abnormality. IMPRESSION: 1. Stable left-sided dialysis catheter which appears intact. 2. Stable aeration of the lungs with no new or worsening focal airspace disease. The known thick-walled cavitary lesion in the left upper lobe is not well seen on the current study. Electronically Signed   By: Valetta Mole M.D.   On: 06/09/2022 16:16   CT CHEST ABDOMEN PELVIS WO CONTRAST  Result Date: 05/27/2022 CLINICAL DATA:  Abdominal pain and left basilar atelectasis EXAM: CT CHEST, ABDOMEN AND PELVIS WITHOUT CONTRAST TECHNIQUE: Multidetector CT imaging of the chest, abdomen and pelvis was performed following the standard protocol without IV contrast. RADIATION DOSE REDUCTION: This exam was performed according to the departmental dose-optimization program which includes automated exposure control, adjustment of the mA and/or kV according to patient size and/or use of iterative reconstruction technique. COMPARISON:  04/19/2022, chest x-ray from earlier in the same day. FINDINGS: CT CHEST FINDINGS Cardiovascular: Limited due to lack of IV contrast. Mild atherosclerotic calcifications are noted. No cardiac enlargement is seen. Dialysis catheter is noted from the left jugular approach. Mediastinum/Nodes: Thoracic inlet is within  normal limits. No hilar or mediastinal adenopathy is noted. The esophagus is within normal limits. Lungs/Pleura: Lungs demonstrate diffuse emphysematous changes bilaterally. Thick walled cavitary lesion is noted in the  left upper lobe measuring approximately 6.5 cm in greatest dimension. This is roughly stable to that seen on the prior exam. Node parenchymal nodules are seen. No other focal infiltrate is noted. Musculoskeletal: No acute rib abnormality is noted. No compression deformities are seen. CT ABDOMEN PELVIS FINDINGS Hepatobiliary: No focal liver abnormality is seen. Status post cholecystectomy. No biliary dilatation. Pancreas: Pancreas is well visualized with a few fluid attenuation areas adjacent to the pancreas. One is near the junction of the head and body superiorly best seen on image number 54 of series 2. This measures approximately 5.3 cm. A second fluid attenuation lesion measuring 7.3 x 5.1 cm is noted along the body of the pancreas. These changes are consistent with pseudocyst formation. The degree of inflammatory change seen previously has improved significantly. No definitive acute on chronic pancreatitis is noted at this time. Spleen: Normal in size without focal abnormality. Adrenals/Urinary Tract: Adrenal glands are within normal limits. Kidneys demonstrate no renal calculi or obstructive changes. No ureteral stones are seen. The bladder is partially distended. Stomach/Bowel: Considerable fecal material is noted throughout the colon consistent with a degree of constipation. No obstructive changes are seen. The appendix is within normal limits. Vascular/Lymphatic: Aortic atherosclerosis. No enlarged abdominal or pelvic lymph nodes. Reproductive: Prostate is unremarkable. Other: No abdominal wall hernia or abnormality. No abdominopelvic ascites. Musculoskeletal: Bilateral hip replacements are noted. Degenerative changes of lumbar spine are seen. IMPRESSION: CT of the chest: Stable appearing thick walled cavitary lesion in the left upper lobe. No new focal abnormality is noted. CT of the abdomen and pelvis: Two fluid attenuation lesion adjacent to the pancreas consistent with pseudocyst formation. Significant  improvement in the degree of inflammatory change of the pancreas is noted when compared with the prior study. Gallbladder has been surgically removed. No definitive biliary ductal stones are seen. Changes consistent with colonic constipation. Electronically Signed   By: Inez Catalina M.D.   On: 05/27/2022 23:57   DG Abd Acute W/Chest  Result Date: 05/27/2022 CLINICAL DATA:  Constipation and abdominal. EXAM: DG ABDOMEN ACUTE WITH 1 VIEW CHEST COMPARISON:  Chest radiograph 05/08/2022. Chest abdomen pelvis CT 04/19/2022. FINDINGS: Left internal jugular dialysis catheter unchanged in position. Improved lung volumes from prior exam. Cavitary process in the anterior left hemithorax is only faintly visualized by radiograph, grossly stable. Left basilar atelectasis. Heart is normal in size. No acute airspace disease. No free intra-abdominal air. No bowel dilatation to suggest obstruction. There is a moderate volume of stool throughout the colon. Mild rectal distention with stool. Cholecystectomy clips in the right upper quadrant. Bilateral hip arthroplasties. IMPRESSION: 1. Moderate colonic stool burden with mild rectal distention with stool. No bowel obstruction or free air. 2. Improved lung volumes from prior exam with left basilar atelectasis. Cavitary process in the left lung is only faintly visualized by radiograph. Electronically Signed   By: Keith Rake M.D.   On: 05/27/2022 22:37   IR Fluoro Guide CV Line Left  Result Date: 05/13/2022 INDICATION: 61 year old male referred for exchange of nonfunctional left IJ tunneled hemodialysis catheter. EXAM: IMAGE GUIDED EXCHANGE OF TUNNELED HEMODIALYSIS CATHETER MEDICATIONS: None ANESTHESIA/SEDATION: None FLUOROSCOPY TIME:  Fluoroscopy Time: 0 minutes 18 seconds (22 mGy). COMPLICATIONS: None PROCEDURE: Informed written consent was obtained from the patient after a discussion of the risks, benefits, and alternatives to  treatment. Questions regarding the procedure  were encouraged and answered. The left neck and chest including the indwelling catheter were prepped with chlorhexidine in a sterile fashion, and a sterile drape was applied covering the operative field. Maximum barrier sterile technique with sterile gowns and gloves were used for the procedure. A timeout was performed prior to the initiation of the procedure. 1% lidocaine was used for local anesthesia. Aspiration was performed on the blue hub with clearance of the heparin dwell. A stiff Glidewire was advanced through the catheter into the IVC. Sutures were removed and then the catheter was removed on the wire. A new 23 cm tip to cuff catheter was placed on the stiff Glidewire. Tip was positioned in the upper right atrium. Wire was removed. The catheter aspirates and flushes normally. The catheter was flushed with appropriate volume heparin dwells. The catheter exit site was secured with a 0-Prolene retention suture. Dressings were applied. The patient tolerated the procedure well without immediate post procedural complication. IMPRESSION: Status post image guided exchange of left-sided IJ tunneled hemodialysis catheter. Signed, Dulcy Fanny. Nadene Rubins, RPVI [facility] Electronically Signed   By: Corrie Mckusick D.O.   On: 05/13/2022 14:41    Labs:  CBC: Recent Labs    05/29/22 0500 05/30/22 0812 05/31/22 0749 06/01/22 1011 06/09/22 1535  WBC 13.7* 10.8* 11.6*  --  12.7*  HGB 7.5* 6.7* 7.5* 9.1* 8.4*  HCT 24.7* 22.6* 24.4* 29.4* 27.6*  PLT 275 260 262  --  343    COAGS: Recent Labs    07/02/21 1255 04/14/22 0225 04/15/22 0351 05/29/22 0500  INR 1.0  --   --   --   APTT  --  26 46* 42*    BMP: Recent Labs    05/29/22 0500 05/30/22 0812 05/31/22 0749 06/09/22 1535  NA 134* 135 133* 139  K 3.9 3.7 3.8 4.1  CL 94* 94* 92* 99  CO2 '27 28 26 26  '$ GLUCOSE 118* 98 98 119*  BUN 20 24* 31* 27*  CALCIUM 9.2 9.3 8.9 9.8   CREATININE 6.65* 8.60* 9.46* 6.10*  GFRNONAA 9* 6* 6* 10*    LIVER FUNCTION TESTS: Recent Labs    05/11/22 0255 05/12/22 0308 05/14/22 0251 05/27/22 2202 05/29/22 0500  BILITOT 0.6 0.7  --  0.7 0.7  AST 28 29  --  21 21  ALT 11 11  --  16 15  ALKPHOS 86 85  --  109 103  PROT 8.9* 9.1*  --  8.4* 7.8  ALBUMIN 3.3* 3.3* 3.3* 3.3* 3.0*    TUMOR MARKERS: No results for input(s): "AFPTM", "CEA", "CA199", "CHROMGRNA" in the last 8760 hours.  Assessment and Plan: Clotted HD catheter Patient with history of R and L IJ catheter placements both of which have been temperamental.  His R IJ was found to be occluded 8/29, now the L IJ catheter has failed for a 3rd time despite attempts by kidney center to restore flow.  Patient not NPO today.  Plan for line exchange, possible new placement tomorrow.  Discussed with patient who is aware the next location for catheter placements would be thigh/groin if unable to replace L IJ.   Risks and benefits discussed with the patient including, but not limited to bleeding, infection, vascular injury, pneumothorax which may require chest tube placement, air embolism or even death  All of the patient's questions were answered, patient is agreeable to proceed. Consent signed and in chart.  Thank you for this interesting consult.  I greatly enjoyed meeting [facility] and look forward to participating in their care.  A copy of this report was sent to the requesting provider on this date.  Electronically Signed: Docia Barrier, PA 06/10/2022, 1:10 PM   I spent a total of 20 Minutes    in face to face in clinical consultation, greater than 50% of which was counseling/coordinating care for HD catheter malfunction.

## 2022-06-10 NOTE — ED Notes (Signed)
No further complaints , waiting to be seen.

## 2022-06-10 NOTE — Consult Note (Signed)
Renal Service Consult Note [facility]  Mathew Lara 06/10/2022 [name], MD Requesting Physician: Dr. Ronnald Nian  Reason for Consult: ESRD pt w/ nonfunctioning TDC  HPI: The patient is a 61 y.o. year-old w/ hx of etoh abuse, anxiety, esrd on HD, COPD, DM, HTN, hep C, HL, OSA who presented to ED today w/ c/o nonfunctioning tunneled HD catheter, sent from his dialysis clinic. In ED VVS and labs stable for esrd. ED has consulted IR to see for replacement of his catheter. We are asked to see for ESRD.    Pt seen in ED room. No distress, calm. States he is urinating well and wonders why he still needs dialysis.  I told him his fluid control is good w/ the voiding well but his kidney numbers are still too high to take him off dialysis.   In 2022 pt had eGFR of about 30 ml/min. In Aug 2023 pt admitted to ICU w/ shock and AKI and required CRRT. Renal function did not recover and he was transitioned to [facility] and dc'd to OP HD after 25 days in the hospital. In September he was admitted for several days w/ creatinine around 6-10 and was getting HD 3x per week while admitted.   ROS - denies CP, no joint pain, no HA, no blurry vision, no rash, no diarrhea, no nausea/ vomiting, no dysuria, no difficulty voiding   Past Medical History  Past Medical History:  Diagnosis Date   Alcohol abuse    Anxiety    Bronchitis    Chronic hip pain    CKD (chronic kidney disease) stage 3, GFR 30-59 ml/min (HCC)    COPD (chronic obstructive pulmonary disease) ([place])    DKA (diabetic ketoacidosis) ([place]) 04/10/2022   Essential hypertension    Hepatitis C    Mixed hyperlipidemia    Sleep apnea    Past Surgical History  Past Surgical History:  Procedure Laterality Date   BALLOON DILATION N/A 07/06/2021   Procedure: BALLOON DILATION;  Surgeon: [name], DO;  [facility];  Service: Endoscopy;  Laterality: N/A;   BIOPSY  07/06/2021   Procedure: BIOPSY;  Surgeon: [name], DO;  [facility];  Service: Endoscopy;;   CATARACT EXTRACTION Right    CHOLECYSTECTOMY N/A 03/23/2021   Procedure: LAPAROSCOPIC CHOLECYSTECTOMY;  Surgeon: [name], MD;  Location: [facility];  Service: General;  Laterality: N/A;   COLONOSCOPY N/A 01/23/2014   SLF:The LEFT COLON IS EXTREMELY redundant/TWO RECTAL POLYPS REMOVED/SMALL VOLUME RECTAL BLEEDING MOST LIKELY DUE TO Small internal hemorrhoids. path with prolapse type polyp of benign-mucosa. no adenomas   COLONOSCOPY WITH PROPOFOL N/A 07/06/2021   non-bleeding internal hemorrhoids, one 8 mm polyp in sigmod s/p removal and clip placed. One 5 mm polyp at recto-sigmoid s/p removal. 3 year surveillance. Tubular adenomas   ESOPHAGOGASTRODUODENOSCOPY N/A 01/23/2014   IRJ:JOACZYSA ring at the gastroesophageal junction/Medium sized hiatal hernia/NDYSPEPSIA MOST LIKELY DUE TO GERD/MILD Non-erosive gastritis   ESOPHAGOGASTRODUODENOSCOPY N/A 04/18/2018   food impaction, small hiatal hernia   ESOPHAGOGASTRODUODENOSCOPY (EGD) WITH PROPOFOL N/A 07/06/2021   moderate Schatzki ring s/p dilation, gastritis s/p biopsy, normal duodenum. Negative H.pylori.   HEMORRHOID BANDING  01/23/2014   Procedure: HEMORRHOID BANDING;  Surgeon: Danie Binder, MD;  [facility];  Service: Endoscopy;;   IR FLUORO GUIDE CV LINE LEFT  05/04/2022   IR FLUORO GUIDE CV LINE LEFT  05/13/2022   IR US GUIDE VASC ACCESS LEFT  05/04/2022  JOINT REPLACEMENT Right 2017   LIVER BIOPSY N/A 03/23/2021   Procedure: LAPAROSCOPIC LIVER BIOPSY;  Surgeon: [name], MD;  Location: [facility];  Service: General;  Laterality: N/A;   MOUTH SURGERY     POLYPECTOMY  07/06/2021   Procedure: POLYPECTOMY;  Surgeon: [name], DO;  [facility];  Service: Endoscopy;;   TOTAL HIP ARTHROPLASTY Right 06/04/2016   Procedure: RIGHT TOTAL HIP ARTHROPLASTY ANTERIOR APPROACH;  Surgeon: Mcarthur Rossetti, MD;  Location: [facility];  Service:  Orthopedics;  Laterality: Right;   TOTAL HIP ARTHROPLASTY Left 03/02/2019   Procedure: LEFT TOTAL HIP ARTHROPLASTY ANTERIOR APPROACH;  Surgeon: Mcarthur Rossetti, MD;  Location: [facility];  Service: Orthopedics;  Laterality: Left;   Family History  Family History  Problem Relation Age of Onset   Cancer Mother    Colon cancer Neg Hx    Colon polyps Neg Hx    Social History  reports that he quit smoking about 8 years ago. His smoking use included cigarettes. He started smoking about 50 years ago. He has a 42.00 pack-year smoking history. He has been exposed to tobacco smoke. He has never used smokeless tobacco. He reports that he does not currently use alcohol. He reports that he does not use drugs. Allergies No Known Allergies Home medications Prior to Admission medications   Medication Sig Start Date End Date Taking? Authorizing Provider  acetaminophen (TYLENOL) 325 MG tablet Take 2 tablets (650 mg total) by mouth every 6 (six) hours as needed for mild pain (or Fever >/= 101). 06/01/22   Emokpae, Courage, MD  albuterol (PROVENTIL) (2.5 MG/3ML) 0.083% nebulizer solution Take 3 mLs (2.5 mg total) by nebulization every 4 (four) hours as needed for wheezing or shortness of breath. 05/14/22   Elgergawy, Silver Huguenin, MD  arformoterol (BROVANA) 15 MCG/2ML NEBU Take 2 mLs (15 mcg total) by nebulization 2 (two) times daily. 05/14/22   Elgergawy, Silver Huguenin, MD  aspirin EC 81 MG tablet Take 1 tablet (81 mg total) by mouth daily with breakfast. 06/01/22   Roxan Hockey, MD  atorvastatin (LIPITOR) 80 MG tablet Take 1 tablet (80 mg total) by mouth daily. Patient taking differently: Take 80 mg by mouth at bedtime. 05/14/22   Elgergawy, Silver Huguenin, MD  B Complex-C-Zn-Folic Acid (DIALYVITE 161 WITH ZINC) 0.8 MG TABS Take 1 tablet by mouth daily.    [provider]  calcium carbonate (TUMS - DOSED IN MG ELEMENTAL CALCIUM) 500 MG chewable tablet Chew 1 tablet (200 mg of elemental calcium total) by mouth every 8  (eight) hours as needed for indigestion or heartburn. 05/14/22   Elgergawy, Silver Huguenin, MD  ferrous fumarate (FERRETTS) 325 (106 Fe) MG TABS tablet Take 1 tablet by mouth daily.    [provider]  insulin aspart (NOVOLOG) 100 UNIT/ML injection Inject 1-3 Units into the skin 3 (three) times daily with meals. Patient taking differently: Inject 2-12 Units into the skin See admin instructions. Inject per sliding scale: If 201-250=2 units 251-300=4 units 301-350=6 units 351-400=8 units 401-450=10 units 451-500=12 units If reads high, give 14 units, recheck in 2 hours. Call MD if blood sugar is >400 or <10 05/14/22   Elgergawy, Silver Huguenin, MD  insulin detemir (LEVEMIR) 100 UNIT/ML injection Inject 0.08 mLs (8 Units total) into the skin 2 (two) times daily. 06/01/22   Roxan Hockey, MD  loratadine (CLARITIN) 10 MG tablet Take 1 tablet (10 mg total) by mouth daily. 09/02/19   Barton Dubois, MD  midodrine (PROAMATINE) 10 MG tablet Take 1 tablet (10 mg total) by mouth 3 (three) times daily with meals. 06/01/22   Roxan Hockey, MD  Multiple Vitamin (MULTIVITAMIN) tablet Take 1 tablet by mouth daily.    [provider]  multivitamin (RENA-VIT) TABS tablet Take 1 tablet by mouth at bedtime. Patient not taking: Reported on 05/28/2022 05/14/22   Elgergawy, Silver Huguenin, MD  oxyCODONE (ROXICODONE) 5 MG immediate release tablet Take 1 tablet (5 mg total) by mouth every 8 (eight) hours as needed for severe pain. 06/01/22   Roxan Hockey, MD  OXYGEN Inhale 2 L into the lungs continuous. 05/14/22   [provider]  pantoprazole (PROTONIX) 40 MG tablet Take 1 tablet (40 mg total) by mouth 2 (two) times daily before a meal. 30 minutes before breakfast 03/08/22 09/04/22  Annitta Needs, NP  polyethylene glycol (MIRALAX / GLYCOLAX) 17 g packet Take 17 g by mouth 2 (two) times daily. 06/01/22   Roxan Hockey, MD  QUEtiapine (SEROQUEL) 25 MG tablet Take 1 tablet (25 mg total) by mouth at bedtime. 05/14/22    Elgergawy, Silver Huguenin, MD  revefenacin (YUPELRI) 175 MCG/3ML nebulizer solution Take 3 mLs (175 mcg total) by nebulization daily. Patient taking differently: Inhale 175 mcg into the lungs daily. 05/15/22   Elgergawy, Silver Huguenin, MD  senna-docusate (SENOKOT-S) 8.6-50 MG tablet Take 2 tablets by mouth at bedtime. 06/01/22   Roxan Hockey, MD  sevelamer carbonate (RENVELA) 800 MG tablet Take 3 tablets (2,400 mg total) by mouth 3 (three) times daily with meals. 05/14/22   Elgergawy, Silver Huguenin, MD  simethicone (MYLICON) 80 MG chewable tablet Chew 1 tablet (80 mg total) by mouth 4 (four) times daily. Patient taking differently: Chew 80 mg by mouth every 4 (four) hours as needed for flatulence. 05/14/22   Elgergawy, Silver Huguenin, MD  sodium chloride (OCEAN) 0.65 % SOLN nasal spray Place 1 spray into both nostrils as needed for congestion. Patient taking differently: Place 1 spray into both nostrils every 2 (two) hours as needed for congestion. 05/14/22   Elgergawy, Silver Huguenin, MD  zolpidem (AMBIEN) 5 MG tablet Take 1 tablet (5 mg total) by mouth at bedtime as needed for sleep. 06/01/22   Roxan Hockey, MD     Vitals:   06/09/22 2252 06/10/22 0156 06/10/22 0249 06/10/22 0612  BP: (!) 156/92 (!) 130/95  (!) 150/102  Pulse: 96 (!) 103  100  Resp: (!) '22 16  19  ' Temp: (!) 97.5 F (36.4 C)  98.7 F (37.1 C) 98.6 F (37 C)  TempSrc: Oral  Oral Oral  SpO2: 97% 97%  100%   Exam Gen alert, no distress, obese No rash, cyanosis or gangrene Sclera anicteric, throat clear  No jvd or bruits Chest clear bilat to bases, no rales/ wheezing RRR no MRG Abd soft ntnd no mass or ascites +bs GU normal male MS no joint effusions or deformity Ext no LE or UE edema, no wounds or ulcers Neuro is alert, Ox 3 , nf    LIJ TDC   Home meds include - atorvastatin, pantoprazole, quetiapine 25 hs, prns/ vits/ supps   OP HD: MWF [place]  4h  400/600   112.5kg  2/2 bath  Hep 2500  LIJ TDC - last HD 10/2, post wt 113.5kg -  mircera 120 ug q 4 wks, last dose 9/27 (50 mcg) - last Hb 8.7 on 10/2 - pth low at 16 on 9/27     Date  Creat  eGFR  2014- 17 1.34- 2.53    2019- 20 1.47- 3.23    2021  2.66 >> 1.87 AKI episode      3.12 >> 1.35   AKI episode    2022  2.02- 2.83 25- 33 ml/min, stage 3b-4    03/16/22 2.29  31 ml/min     8/5- 8/30  3.33 >> 13.23 Started CRRT here    9/21- 9/25 6.65- 9.46 On dialysis here    10/04 6.10   Admit yesterday         8/29 - IR placement of LIJ TDC; 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 Pt is admitted, and IR consulted for [facility] replacement. Will likely be here tomorrow and will plan for iHD most likely tomorrow after [facility] replacement.  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 Hoping for recovery of renal function still, but labs do not support this yet. Have explained to the pt.  BP/ vol - euvolemic on exam, no edema, clear lungs. Not on any BP lowering meds, BP's wnl.  COPD/ OSA DM2 - per pmd Anemia ckd - Hb 8.4 here, last esa given on 9/27 as outpt. Follow.  MBD ckd - Ca ok, get alb/ phos. Not on vdra.       Kelly Splinter  MD 06/10/2022, 9:32 AM Recent Labs  Lab 06/09/22 1535  HGB 8.4*  CALCIUM 9.8  CREATININE 6.10*  K 4.1   Inpatient medications:  atorvastatin  80 mg Oral QHS   insulin aspart  0-6 Units Subcutaneous Q4H   pantoprazole  40 mg Oral BID AC   QUEtiapine  25 mg Oral QHS    acetaminophen

## 2022-06-10 NOTE — H&P (Addendum)
History and Physical    Patient: Mathew Lara [id number]:[id number] DOB: May 30, 1961 DOA: 06/09/2022 DOS: the patient was seen and examined on 06/10/2022 PCP: [name], MD  Patient coming from: Home  Chief Complaint:  Chief Complaint  Patient presents with   Vascular Access Problem   HPI: Mathew Lara is a 61 y.o. male with medical history significant of chronic kidney disease on dialysis, chronic constipation with recent admission 2 weeks prior for same, anemia and chronic kidney disease, recent acute pancreatitis 2 weeks prior with associated pseudocyst related to chronic pancreatitis, diabetes mellitus 2 on insulin, remote h/o alcohol abuse/polysubstance abuse, COPD, sleep apnea, hypertension, history of hepatitis C, dyslipidemia.  Recently discharged on 9/26 after admission for abdominal pain related to severe constipation.  He was started on scheduled Senokot and MiraLAX and patient reporting significant improvement in symptoms with last BM yesterday 10/4.  Patient presented to dialysis on 10/4 (Wednesday) per his usual but they were unable to utilize his dialysis access due to clotting.  He was subsequently sent to the ER for further evaluation.  In the ER patient's labs were stable with no hyperkalemia.  No evidence of volume overload.  EDP has spoken to both interventional radiology and vascular team who state that at this present time they do not have a spot on their schedule to proceed with declotting of the vascular access.  Nephrology has seen the patient and states the patient at this time does not need emergent dialysis.  Patient will be placed in observation with plans to eventually undergo declotting of current vascular access.  Patient will remain n.p.o. and if unable to undergo procedure today will allow patient to eat and will become n.p.o. after midnight.  Review of Systems: As mentioned in the history of present illness. All other systems reviewed and are negative. Past  Medical History:  Diagnosis Date   Alcohol abuse    Anxiety    Bronchitis    Chronic hip pain    CKD (chronic kidney disease) stage 3, GFR 30-59 ml/min (HCC)    COPD (chronic obstructive pulmonary disease) ([place])    DKA (diabetic ketoacidosis) ([place]) 04/10/2022   Essential hypertension    Hepatitis C    Mixed hyperlipidemia    Sleep apnea    Past Surgical History:  Procedure Laterality Date   BALLOON DILATION N/A 07/06/2021   Procedure: BALLOON DILATION;  Surgeon: [name], DO;  [facility];  Service: Endoscopy;  Laterality: N/A;   BIOPSY  07/06/2021   Procedure: BIOPSY;  Surgeon: [name], DO;  [facility];  Service: Endoscopy;;   CATARACT EXTRACTION Right    CHOLECYSTECTOMY N/A 03/23/2021   Procedure: LAPAROSCOPIC CHOLECYSTECTOMY;  Surgeon: [name], MD;  Location: [facility];  Service: General;  Laterality: N/A;   COLONOSCOPY N/A 01/23/2014   SLF:The LEFT COLON IS EXTREMELY redundant/TWO RECTAL POLYPS REMOVED/SMALL VOLUME RECTAL BLEEDING MOST LIKELY DUE TO Small internal hemorrhoids. path with prolapse type polyp of benign-mucosa. no adenomas   COLONOSCOPY WITH PROPOFOL N/A 07/06/2021   non-bleeding internal hemorrhoids, one 8 mm polyp in sigmod s/p removal and clip placed. One 5 mm polyp at recto-sigmoid s/p removal. 3 year surveillance. Tubular adenomas   ESOPHAGOGASTRODUODENOSCOPY N/A 01/23/2014   YBO:FBPZWCHE ring at the gastroesophageal junction/Medium sized hiatal hernia/NDYSPEPSIA MOST LIKELY DUE TO GERD/MILD Non-erosive gastritis   ESOPHAGOGASTRODUODENOSCOPY N/A 04/18/2018   food impaction, small hiatal hernia   ESOPHAGOGASTRODUODENOSCOPY (EGD) WITH PROPOFOL N/A 07/06/2021   moderate Schatzki ring  s/p dilation, gastritis s/p biopsy, normal duodenum. Negative H.pylori.   HEMORRHOID BANDING  01/23/2014   Procedure: HEMORRHOID BANDING;  Surgeon: Danie Binder, MD;  [facility];  Service: Endoscopy;;   IR FLUORO GUIDE  CV LINE LEFT  05/04/2022   IR FLUORO GUIDE CV LINE LEFT  05/13/2022   IR US GUIDE VASC ACCESS LEFT  05/04/2022   JOINT REPLACEMENT Right 2017   LIVER BIOPSY N/A 03/23/2021   Procedure: LAPAROSCOPIC LIVER BIOPSY;  Surgeon: [name], MD;  Location: [facility];  Service: General;  Laterality: N/A;   MOUTH SURGERY     POLYPECTOMY  07/06/2021   Procedure: POLYPECTOMY;  Surgeon: [name], DO;  [facility];  Service: Endoscopy;;   TOTAL HIP ARTHROPLASTY Right 06/04/2016   Procedure: RIGHT TOTAL HIP ARTHROPLASTY ANTERIOR APPROACH;  Surgeon: Mcarthur Rossetti, MD;  Location: [facility];  Service: Orthopedics;  Laterality: Right;   TOTAL HIP ARTHROPLASTY Left 03/02/2019   Procedure: LEFT TOTAL HIP ARTHROPLASTY ANTERIOR APPROACH;  Surgeon: Mcarthur Rossetti, MD;  Location: [facility];  Service: Orthopedics;  Laterality: Left;   Social History:  reports that he quit smoking about 8 years ago. His smoking use included cigarettes. He started smoking about 50 years ago. He has a 42.00 pack-year smoking history. He has been exposed to tobacco smoke. He has never used smokeless tobacco. He reports that he does not currently use alcohol. He reports that he does not use drugs.  No Known Allergies  Family History  Problem Relation Age of Onset   Cancer Mother    Colon cancer Neg Hx    Colon polyps Neg Hx     Prior to Admission medications   Medication Sig Start Date End Date Taking? Authorizing Provider  acetaminophen (TYLENOL) 325 MG tablet Take 2 tablets (650 mg total) by mouth every 6 (six) hours as needed for mild pain (or Fever >/= 101). 06/01/22   Emokpae, Courage, MD  albuterol (PROVENTIL) (2.5 MG/3ML) 0.083% nebulizer solution Take 3 mLs (2.5 mg total) by nebulization every 4 (four) hours as needed for wheezing or shortness of breath. 05/14/22   Elgergawy, Silver Huguenin, MD  arformoterol (BROVANA) 15 MCG/2ML NEBU Take 2 mLs (15 mcg total) by nebulization 2 (two) times daily. 05/14/22    Elgergawy, Silver Huguenin, MD  aspirin EC 81 MG tablet Take 1 tablet (81 mg total) by mouth daily with breakfast. 06/01/22   Roxan Hockey, MD  atorvastatin (LIPITOR) 80 MG tablet Take 1 tablet (80 mg total) by mouth daily. Patient taking differently: Take 80 mg by mouth at bedtime. 05/14/22   Elgergawy, Silver Huguenin, MD  B Complex-C-Zn-Folic Acid (DIALYVITE 947 WITH ZINC) 0.8 MG TABS Take 1 tablet by mouth daily.    [provider]  calcium carbonate (TUMS - DOSED IN MG ELEMENTAL CALCIUM) 500 MG chewable tablet Chew 1 tablet (200 mg of elemental calcium total) by mouth every 8 (eight) hours as needed for indigestion or heartburn. 05/14/22   Elgergawy, Silver Huguenin, MD  ferrous fumarate (FERRETTS) 325 (106 Fe) MG TABS tablet Take 1 tablet by mouth daily.    [provider]  insulin aspart (NOVOLOG) 100 UNIT/ML injection Inject 1-3 Units into the skin 3 (three) times daily with meals. Patient taking differently: Inject 2-12 Units into the skin See admin instructions. Inject per sliding scale: If 201-250=2 units 251-300=4 units 301-350=6 units 351-400=8 units 401-450=10 units 451-500=12 units If reads high, give 14 units, recheck in 2 hours. Call  MD if blood sugar is >400 or <10 05/14/22   Elgergawy, Silver Huguenin, MD  insulin detemir (LEVEMIR) 100 UNIT/ML injection Inject 0.08 mLs (8 Units total) into the skin 2 (two) times daily. 06/01/22   Roxan Hockey, MD  loratadine (CLARITIN) 10 MG tablet Take 1 tablet (10 mg total) by mouth daily. 09/02/19   Barton Dubois, MD  midodrine (PROAMATINE) 10 MG tablet Take 1 tablet (10 mg total) by mouth 3 (three) times daily with meals. 06/01/22   Roxan Hockey, MD  Multiple Vitamin (MULTIVITAMIN) tablet Take 1 tablet by mouth daily.    [provider]  multivitamin (RENA-VIT) TABS tablet Take 1 tablet by mouth at bedtime. Patient not taking: Reported on 05/28/2022 05/14/22   Elgergawy, Silver Huguenin, MD  oxyCODONE (ROXICODONE) 5 MG immediate release tablet  Take 1 tablet (5 mg total) by mouth every 8 (eight) hours as needed for severe pain. 06/01/22   Roxan Hockey, MD  OXYGEN Inhale 2 L into the lungs continuous. 05/14/22   [provider]  pantoprazole (PROTONIX) 40 MG tablet Take 1 tablet (40 mg total) by mouth 2 (two) times daily before a meal. 30 minutes before breakfast 03/08/22 09/04/22  Annitta Needs, NP  polyethylene glycol (MIRALAX / GLYCOLAX) 17 g packet Take 17 g by mouth 2 (two) times daily. 06/01/22   Roxan Hockey, MD  QUEtiapine (SEROQUEL) 25 MG tablet Take 1 tablet (25 mg total) by mouth at bedtime. 05/14/22   Elgergawy, Silver Huguenin, MD  revefenacin (YUPELRI) 175 MCG/3ML nebulizer solution Take 3 mLs (175 mcg total) by nebulization daily. Patient taking differently: Inhale 175 mcg into the lungs daily. 05/15/22   Elgergawy, Silver Huguenin, MD  senna-docusate (SENOKOT-S) 8.6-50 MG tablet Take 2 tablets by mouth at bedtime. 06/01/22   Roxan Hockey, MD  sevelamer carbonate (RENVELA) 800 MG tablet Take 3 tablets (2,400 mg total) by mouth 3 (three) times daily with meals. 05/14/22   Elgergawy, Silver Huguenin, MD  simethicone (MYLICON) 80 MG chewable tablet Chew 1 tablet (80 mg total) by mouth 4 (four) times daily. Patient taking differently: Chew 80 mg by mouth every 4 (four) hours as needed for flatulence. 05/14/22   Elgergawy, Silver Huguenin, MD  sodium chloride (OCEAN) 0.65 % SOLN nasal spray Place 1 spray into both nostrils as needed for congestion. Patient taking differently: Place 1 spray into both nostrils every 2 (two) hours as needed for congestion. 05/14/22   Elgergawy, Silver Huguenin, MD  zolpidem (AMBIEN) 5 MG tablet Take 1 tablet (5 mg total) by mouth at bedtime as needed for sleep. 06/01/22   Roxan Hockey, MD    Physical Exam: Vitals:   06/10/22 0156 06/10/22 0249 06/10/22 0612 06/10/22 0945  BP: (!) 130/95  (!) 150/102 (!) 114/91  Pulse: (!) 103  100 93  Resp: '16  19 15  '$ Temp:  98.7 F (37.1 C) 98.6 F (37 C)   TempSrc:  Oral Oral   SpO2:  97%  100% 100%   Constitutional: NAD, calm, comfortable Eyes: PERRL, lids and conjunctivae normal ENMT: Mucous membranes are moist. Posterior pharynx clear of any exudate or lesions.Normal dentition.  Neck: normal, supple, no masses, no thyromegaly Respiratory: clear to auscultation bilaterally, no wheezing, no crackles. Normal respiratory effort. No accessory muscle use.  Cardiovascular: Regular rate and rhythm, no murmurs / rubs / gallops. No extremity edema. 2+ pedal pulses. Abdomen: no tenderness, no masses palpated. No hepatosplenomegaly. Bowel sounds positive.  Musculoskeletal: no clubbing / cyanosis. No joint deformity upper and  lower extremities. Good ROM, no contractures. Normal muscle tone.  Neurologic: CN 2-12 grossly intact. Sensation intact, DTR normal. Strength 5/5 x all 4 extremities.  Psychiatric: Normal judgment and insight. Alert and oriented x 3. Normal mood.   Data Reviewed:  Laboratory data: Sodium 139, potassium 4.1, chloride 99, CO2 26, glucose 119, BUN 27, creatinine 6.1, WBCs 12,700 with neutrophils 83% with absolute neutrophils 10.4, hemoglobin 8.4, platelets 343,000  Diagnostic data: Chest x-ray: Stable left-sided dialysis catheter which appears intact. Stable aeration of the lungs with no new or worsening focal airspace disease. The known thick-walled cavitary lesion in the left upper lobe is not well seen on the current study.  Assessment and Plan: Clotted dialysis access/CKD 5/anemia and chronic kidney disease Presented from outpatient dialysis center with clotted access.  Patient has been waiting for 24 hours in the ED and as of today it is unclear whether IR or vascular team can proceed with declotting procedure therefore patient will be placed in observation until procedure can be completed. It is anticipated that given patient's dialysis schedule of MWF with a seat time of 1130 he will probably need to complete dialysis here before discharging home. Patient  will be n.p.o. today until definitely clarified that he will not go to procedure today then he will we will be allowed a renal carb modified diet with n.p.o. after midnight in anticipation of procedure Continue multiple vitamins, iron replacement Continue midodrine 10 mg 3 times daily  Diabetes mellitus 2 without hyperglycemia on chronic insulin Follow CBGs every 4 hours with SSI On twice daily Levemir at home.  We will hold for now n.p.o. 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 Observation status is judged to be reasonable and necessary in order to provide the required intensity of service to ensure the patient's safety. The patient's presenting symptoms, physical exam findings, and initial radiographic and laboratory data in the context of their medical condition is felt to place them at decreased risk for further clinical deterioration. Furthermore, it is anticipated that the patient will be medically stable for discharge from the hospital within 2 midnights of admission.   Author: Erin Hearing, NP 06/10/2022 11:07 AM  For on call review [email or web address].

## 2022-06-11 ENCOUNTER — Other Ambulatory Visit: Payer: Self-pay

## 2022-06-11 ENCOUNTER — Observation Stay (HOSPITAL_COMMUNITY): Payer: Medicare Other

## 2022-06-11 DIAGNOSIS — E119 Type 2 diabetes mellitus without complications: Secondary | ICD-10-CM | POA: Diagnosis not present

## 2022-06-11 DIAGNOSIS — Z992 Dependence on renal dialysis: Secondary | ICD-10-CM | POA: Diagnosis not present

## 2022-06-11 DIAGNOSIS — I959 Hypotension, unspecified: Secondary | ICD-10-CM | POA: Diagnosis not present

## 2022-06-11 DIAGNOSIS — E669 Obesity, unspecified: Secondary | ICD-10-CM | POA: Diagnosis present

## 2022-06-11 DIAGNOSIS — D649 Anemia, unspecified: Secondary | ICD-10-CM | POA: Diagnosis not present

## 2022-06-11 DIAGNOSIS — K859 Acute pancreatitis without necrosis or infection, unspecified: Secondary | ICD-10-CM | POA: Diagnosis not present

## 2022-06-11 DIAGNOSIS — N186 End stage renal disease: Secondary | ICD-10-CM

## 2022-06-11 DIAGNOSIS — G4733 Obstructive sleep apnea (adult) (pediatric): Secondary | ICD-10-CM

## 2022-06-11 DIAGNOSIS — T829XXA Unspecified complication of cardiac and vascular prosthetic device, implant and graft, initial encounter: Secondary | ICD-10-CM

## 2022-06-11 DIAGNOSIS — E1110 Type 2 diabetes mellitus with ketoacidosis without coma: Secondary | ICD-10-CM | POA: Diagnosis not present

## 2022-06-11 DIAGNOSIS — J410 Simple chronic bronchitis: Secondary | ICD-10-CM

## 2022-06-11 DIAGNOSIS — I5032 Chronic diastolic (congestive) heart failure: Secondary | ICD-10-CM | POA: Diagnosis not present

## 2022-06-11 DIAGNOSIS — T8249XA Other complication of vascular dialysis catheter, initial encounter: Secondary | ICD-10-CM | POA: Diagnosis not present

## 2022-06-11 HISTORY — PX: IR FLUORO GUIDE CV LINE LEFT: IMG2282

## 2022-06-11 LAB — RENAL FUNCTION PANEL
Albumin: 2.8 g/dL — ABNORMAL LOW (ref 3.5–5.0)
Anion gap: 16 — ABNORMAL HIGH (ref 5–15)
BUN: 36 mg/dL — ABNORMAL HIGH (ref 8–23)
CO2: 23 mmol/L (ref 22–32)
Calcium: 9.4 mg/dL (ref 8.9–10.3)
Chloride: 100 mmol/L (ref 98–111)
Creatinine, Ser: 5.82 mg/dL — ABNORMAL HIGH (ref 0.61–1.24)
GFR, Estimated: 10 mL/min — ABNORMAL LOW (ref >60)
Glucose, Bld: 111 mg/dL — ABNORMAL HIGH (ref 70–99)
Phosphorus: 4.7 mg/dL — ABNORMAL HIGH (ref 2.5–4.6)
Potassium: 3.6 mmol/L (ref 3.5–5.1)
Sodium: 139 mmol/L (ref 135–145)

## 2022-06-11 LAB — GLUCOSE, CAPILLARY
Glucose-Capillary: 104 mg/dL — ABNORMAL HIGH (ref 70–99)
Glucose-Capillary: 70 mg/dL (ref 70–99)
Glucose-Capillary: 97 mg/dL (ref 70–99)
Glucose-Capillary: 96 mg/dL (ref 70–99)
Glucose-Capillary: 124 mg/dL — ABNORMAL HIGH (ref 70–99)
Glucose-Capillary: 120 mg/dL — ABNORMAL HIGH (ref 70–99)

## 2022-06-11 LAB — CBC
HCT: 28 % — ABNORMAL LOW (ref 39.0–52.0)
Hemoglobin: 8.5 g/dL — ABNORMAL LOW (ref 13.0–17.0)
MCH: 27.1 pg (ref 26.0–34.0)
MCHC: 30.4 g/dL (ref 30.0–36.0)
MCV: 89.2 fL (ref 80.0–100.0)
Platelets: 318 10*3/uL (ref 150–400)
RBC: 3.14 MIL/uL — ABNORMAL LOW (ref 4.22–5.81)
RDW: 16.8 % — ABNORMAL HIGH (ref 11.5–15.5)
WBC: 9.6 10*3/uL (ref 4.0–10.5)
nRBC: 0 % (ref 0.0–0.2)

## 2022-06-11 LAB — MAGNESIUM: Magnesium: 1.9 mg/dL (ref 1.7–2.4)

## 2022-06-11 MED ORDER — CEFAZOLIN SODIUM-DEXTROSE 2-4 GM/100ML-% IV SOLN
INTRAVENOUS | Status: AC | PRN
  Administered 2022-06-11: 2 g via INTRAVENOUS

## 2022-06-11 MED ORDER — CEFAZOLIN SODIUM-DEXTROSE 2-4 GM/100ML-% IV SOLN
INTRAVENOUS | Status: AC
  Filled 2022-06-11: qty 100

## 2022-06-11 MED ORDER — LIDOCAINE HCL (PF) 1 % IJ SOLN
INTRAMUSCULAR | Status: AC | PRN
  Administered 2022-06-11: 10 mL

## 2022-06-11 MED ORDER — HEPARIN SODIUM (PORCINE) 1000 UNIT/ML DIALYSIS
2500.0000 [IU] | INTRAMUSCULAR | Status: DC | PRN
  Administered 2022-06-11: 2500 [IU] via INTRAVENOUS_CENTRAL
  Filled 2022-06-11: qty 3

## 2022-06-11 MED ORDER — MIDAZOLAM HCL 2 MG/2ML IJ SOLN
INTRAMUSCULAR | Status: AC | PRN
  Administered 2022-06-11: .5 mg via INTRAVENOUS
  Administered 2022-06-11: 1 mg via INTRAVENOUS

## 2022-06-11 MED ORDER — HEPARIN SODIUM (PORCINE) 1000 UNIT/ML IJ SOLN
INTRAMUSCULAR | Status: AC
  Filled 2022-06-11: qty 3

## 2022-06-11 MED ORDER — HEPARIN SODIUM (PORCINE) 1000 UNIT/ML IJ SOLN
INTRAMUSCULAR | Status: AC
  Administered 2022-06-11: 3800 mL
  Filled 2022-06-11: qty 10

## 2022-06-11 MED ORDER — CHLORHEXIDINE GLUCONATE 4 % EX LIQD
CUTANEOUS | Status: AC
  Filled 2022-06-11: qty 15

## 2022-06-11 MED ORDER — LIDOCAINE HCL 1 % IJ SOLN
INTRAMUSCULAR | Status: AC
  Administered 2022-06-11: 10 mL
  Filled 2022-06-11: qty 20

## 2022-06-11 MED ORDER — FENTANYL CITRATE (PF) 100 MCG/2ML IJ SOLN
INTRAMUSCULAR | Status: AC | PRN
  Administered 2022-06-11 (×3): 25 ug via INTRAVENOUS

## 2022-06-11 MED ORDER — MIDAZOLAM HCL 2 MG/2ML IJ SOLN
INTRAMUSCULAR | Status: AC
  Filled 2022-06-11: qty 4

## 2022-06-11 MED ORDER — FENTANYL CITRATE (PF) 100 MCG/2ML IJ SOLN
INTRAMUSCULAR | Status: AC
  Filled 2022-06-11: qty 4

## 2022-06-11 NOTE — TOC Progression Note (Signed)
Transition of Care [facility]) - Progression Note    Patient Details  Name: Mathew Lara MRN: [id number] Date of Birth: 09-25-1960  Transition of Care [facility]) CM/SW [place], [place] Phone Number: 06/11/2022, 3:03 PM  Clinical Narrative:    CSW followed up with [facility], they confirmed pt was there for SNF. Facility stated his sister wanted him to be LTC and has his Medicaid pending. Pt will more than likely not want to stay for LTC. Pt is more appropriate for ALF. Facility requested an authorization be attempted, but will take pt back as a Medicaid. Auth needs started with therapy note.The facility will work on next steps with family, and set up transport or ALF as needed. FL2 to be completed. TOC will be available for further needs.   Expected Discharge Plan: [place] Barriers to Discharge: Continued Medical Work up  Expected Discharge Plan and Services Expected Discharge Plan: [place] In-house Referral: Clinical Social Work Discharge Planning Services: CM Consult Post Acute Care Choice: [place] Living arrangements for the past 2 months: [place]                 DME Arranged: N/A DME Agency: NA       HH Arranged: NA HH Agency: NA         Social Determinants of Health (SDOH) Interventions    Readmission Risk Interventions     No data to display

## 2022-06-11 NOTE — Progress Notes (Signed)
[facility]                                                                              Mathew Lara, is a 61 y.o. male, DOB - Jun 09, 1961, [id number] Admit date - 06/09/2022    Outpatient Primary MD for the patient is [name], MD  LOS - 0  days  Chief Complaint  Patient presents with   Vascular Access Problem       Brief summary   Patient is a 61 year old male with ES being on hemodialysis, MWF, chronic constipation, anemia, recent acute pancreatitis 2 weeks prior with associated pseudocyst related to chronic pancreatitis, IDDM type II, remote h/o alcohol abuse/polysubstance abuse, COPD, sleep apnea, hypertension, history of hepatitis C, dyslipidemia.  Recently discharged on on 9/26 after admission for abdominal pain, severe constipation, started on scheduled Senokot, MiraLAX significant improvement in symptoms per patient.  Patient presented to dialysis on 10/4, on Wednesday but his usual but they were unable to utilize his dialysis access due to clotting and was sent to ER. EDP, discussed with nephrology, vascular surgery and IR.  Patient was admitted for further work-up.   Assessment & Plan    Principal Problem:   Clotted dialysis access [facility]), recurrent issue -IR, vascular surgery has been consulted. -Per IR note, plan for line exchange, possible new placement today 10/6, continue n.p.o. - will follow vascular surgery recommendations  Active Problems:  End stage renal disease on dialysis [facility]) MWF -Once dialysis access issue is resolved, patient will need dialysis, has missed dialysis on Wednesday, 10/4 -Nephrology following    COPD (chronic obstructive pulmonary disease) ([place]) -Stable, no wheezing    OSA on CPAP -Continue CPAP qhs    Type 2 diabetes mellitus ([place]), uncontrolled, with ESRD -Continue sliding scale insulin -Hemoglobin A1c 6.8 on 06/10/22  HLP -Continue Lipitor  Chronic constipation -Continue Senokot S2 tabs daily at  bedtime, MiraLAX twice daily     Obesity, Class II, BMI 35-39.9 -Estimated body mass index is 38.38 kg/m as calculated from the following:   Height as of this encounter: '5\' 8"'$  (1.727 m).   Weight as of 05/31/22: 114.5 kg.  Code Status: Full CODE STATUS DVT Prophylaxis:  heparin injection 5,000 Units Start: 06/10/22 1400   Level of Care: Level of care: Med-Surg Family Communication: Updated patient   Disposition Plan:      Remains inpatient appropriate: Pending prophylaxis with likely new placement, then HD    Procedures:  None  Consultants:   Interventional radiology Vascular surgery Nephrology  Antimicrobials:   Anti-infectives (From admission, onward)    Start     Dose/Rate Route Frequency Ordered Stop   06/10/22 1145  ceFAZolin (ANCEF) IVPB 2g/100 mL premix        2 g 200 mL/hr over 30 Minutes Intravenous  Once 06/10/22 1143 06/10/22 1408          Medications  arformoterol  15 mcg Nebulization BID   aspirin EC  81 mg Oral Q breakfast   atorvastatin  80 mg Oral QHS   Chlorhexidine Gluconate Cloth  6 each Topical Q0600   Ferrous Fumarate  1 tablet Oral Daily  heparin  5,000 Units Subcutaneous Q8H   insulin aspart  0-6 Units Subcutaneous TID AC   midodrine  10 mg Oral TID WC   multivitamin  1 tablet Oral QHS   pantoprazole  40 mg Oral BID AC   polyethylene glycol  17 g Oral BID   QUEtiapine  25 mg Oral QHS   senna-docusate  2 tablet Oral QHS   sevelamer carbonate  2,400 mg Oral TID WC   sodium chloride flush  3 mL Intravenous Q12H      Subjective:   Mathew Lara was seen and examined today.  Currently denies any complaints.  Patient denies dizziness, chest pain, shortness of breath, abdominal pain, N/V.  Objective:   Vitals:   06/10/22 2051 06/11/22 0422 06/11/22 0530 06/11/22 0958  BP:   (!) 127/91 (!) 139/98  Pulse:   100 98  Resp:   18 17  Temp:   98.5 F (36.9 C) 99 F (37.2 C)  TempSrc:    Oral  SpO2: 97%  92% 95%  Height:  '5\' 8"'$   (1.727 m)      Intake/Output Summary (Last 24 hours) at 06/11/2022 1046 Last data filed at 06/11/2022 0530 Gross per 24 hour  Intake 540 ml  Output 0 ml  Net 540 ml     Wt Readings from Last 3 Encounters:  05/31/22 114.5 kg  05/14/22 115 kg  03/16/22 134.5 kg     Exam General: Alert and oriented x 3, NAD Cardiovascular: S1 S2 auscultated,  RRR Respiratory: Clear to auscultation bilaterally, no wheezing Gastrointestinal: Soft, nontender, nondistended, + bowel sounds Ext: no pedal edema bilaterally Neuro: no new deficits Psych: Normal affect and demeanor, alert and oriented x3     Data Reviewed:  I have personally reviewed following labs    CBC Lab Results  Component Value Date   WBC 9.6 06/11/2022   RBC 3.14 (L) 06/11/2022   HGB 8.5 (L) 06/11/2022   HCT 28.0 (L) 06/11/2022   MCV 89.2 06/11/2022   MCH 27.1 06/11/2022   PLT 318 06/11/2022   MCHC 30.4 06/11/2022   RDW 16.8 (H) 06/11/2022   LYMPHSABS 1.3 06/09/2022   MONOABS 0.6 06/09/2022   EOSABS 0.3 06/09/2022   BASOSABS 0.1 41/63/8453     Last metabolic panel Lab Results  Component Value Date   NA 139 06/11/2022   K 3.6 06/11/2022   CL 100 06/11/2022   CO2 23 06/11/2022   BUN 36 (H) 06/11/2022   CREATININE 5.82 (H) 06/11/2022   GLUCOSE 111 (H) 06/11/2022   GFRNONAA 10 (L) 06/11/2022   GFRAA >60 11/26/2019   CALCIUM 9.4 06/11/2022   PHOS 4.7 (H) 06/11/2022   PROT 7.8 05/29/2022   ALBUMIN 2.8 (L) 06/11/2022   LABGLOB 4.3 03/16/2022   AGRATIO 1.1 (L) 03/16/2022   BILITOT 0.7 05/29/2022   ALKPHOS 103 05/29/2022   AST 21 05/29/2022   ALT 15 05/29/2022   ANIONGAP 16 (H) 06/11/2022    CBG (last 3)  Recent Labs    06/10/22 1611 06/10/22 2017 06/11/22 0719  GLUCAP 91 103* 104*      Coagulation Profile: No results for input(s): "INR", "PROTIME" in the last 168 hours.   Radiology Studies: I have personally reviewed the imaging studies  DG Chest 2 View  Result Date: 06/09/2022 CLINICAL  DATA:  Dialysis patient with malfunctioning catheter, unable to be dialyzed. EXAM: CHEST - 2 VIEW COMPARISON:  Chest radiograph and CT chest 05/27/2022 FINDINGS: The left-sided dialysis catheter is stable  with the tip terminating in the right atrium. The catheter is intact. The cardiomediastinal silhouette is stable. The known thick-walled cystic lesion in the left upper lobe is not well seen on the current study common better assessed on the CT chest from 05/27/2022. Aeration of the lungs is unchanged since the radiographs from 05/27/2022 with no new or worsening focal airspace disease. Linear opacities in the right midlung likely reflect scarring. There is no pulmonary edema. There is no pleural effusion or pneumothorax There is no acute osseous abnormality. IMPRESSION: 1. Stable left-sided dialysis catheter which appears intact. 2. Stable aeration of the lungs with no new or worsening focal airspace disease. The known thick-walled cavitary lesion in the left upper lobe is not well seen on the current study. Electronically Signed   By: Valetta Mole M.D.   On: 06/09/2022 16:16       Bettyanne Dittman M.D. [facility] 06/11/2022, 10:46 AM  Available via Epic secure chat 7am-7pm After 7 pm, please refer to night coverage provider listed on amion.

## 2022-06-11 NOTE — Progress Notes (Signed)
Pt receives out-pt HD at [facility]) on MWF. Pt arrives at 11:10 for 11:30 chair time. Will assist as needed.  Melven Sartorius Renal Navigator [phone]

## 2022-06-11 NOTE — Progress Notes (Signed)
PT Cancellation Note  Patient Details Name: CALISTRO RAUF MRN: [id number] DOB: May 07, 1961   Cancelled Treatment:    Reason Eval/Treat Not Completed: Patient at procedure or test/unavailable. Pt off unit in IR. PT will attempt to follow up as time allows.   Zenaida Niece 06/11/2022, 2:06 PM

## 2022-06-11 NOTE — TOC Initial Note (Signed)
Transition of Care [facility]) - Initial/Assessment Note    Patient Details  Name: Mathew Lara MRN: [id number] Date of Birth: May 02, 1961  Transition of Care [facility]) CM/SW Contact:    Tom-Johnson, Renea Ee, RN Phone Number: 06/11/2022, 1:03 PM  Clinical Narrative:                  CM spoke with patient at bedside about needs for post hospital transition. Admitted for Clotted Dialysis Catheter. IR consulted and plan for Catheter exchange vs. New placement.  Patient was recently discharged on 06/01/22 for Abdominal Pain/Severe Constipation and went to [facility] for short term rehab. Plan to return at discharge. Patient requests transportation to and from dialysis as he will be discharging from rehab soon and will be staying at his sister's residence.  Has a w/c and cane, on O2 at rehab, did  not use O2 when he was home. Will need home O2 setup.  CM will continue to follow as patient progresses with care towards discharge.    Expected Discharge Plan: Skilled Nursing Facility Barriers to Discharge: Continued Medical Work up   Patient Goals and CMS Choice Patient states their goals for this hospitalization and ongoing recovery are:: To return to rehab. CMS Medicare.gov Compare Post Acute Care list provided to:: Patient Choice offered to / list presented to : Patient  Expected Discharge Plan and Services Expected Discharge Plan: [place] In-house Referral: Clinical Social Work Discharge Planning Services: CM Consult Post Acute Care Choice: [place] Living arrangements for the past 2 months: [place]                 DME Arranged: N/A DME Agency: NA       HH Arranged: NA [place] Agency: NA        Prior Living Arrangements/Services Living arrangements for the past 2 months: [place] Lives with:: Facility Resident Patient language and need for interpreter reviewed:: Yes Do you feel safe going back to the place  where you live?: Yes      Need for Family Participation in Patient Care: Yes (Comment) Care giver support system in place?: Yes (comment) Current home services: DME (W/c, cane) Criminal Activity/Legal Involvement Pertinent to Current Situation/Hospitalization: No - Comment as needed  Activities of Daily Living Home Assistive Devices/Equipment: Environmental consultant (specify type) ADL Screening (condition at time of admission) Patient's cognitive ability adequate to safely complete daily activities?: Yes Is the patient deaf or have difficulty hearing?: No Does the patient have difficulty seeing, even when wearing glasses/contacts?: No Does the patient have difficulty concentrating, remembering, or making decisions?: No Patient able to express need for assistance with ADLs?: Yes Does the patient have difficulty dressing or bathing?: No Independently performs ADLs?: Yes (appropriate for developmental age) Does the patient have difficulty walking or climbing stairs?: No Weakness of Legs: Both Weakness of Arms/Hands: None  Permission Sought/Granted Permission sought to share information with : Case Manager, Customer service manager, Family Supports Permission granted to share information with : Yes, Verbal Permission Granted              Emotional Assessment Appearance:: Appears stated age Attitude/Demeanor/Rapport: Engaged, Gracious Affect (typically observed): Accepting, Appropriate, Calm, Hopeful, Pleasant Orientation: : Oriented to Self, Oriented to  Time, Oriented to Situation Alcohol / Substance Use: Not Applicable    Admission diagnosis:  ESRD (end stage renal disease) ([place]) [N18.6] Clotted dialysis access [facility]) [T51.76HY] Complication of vascular access for dialysis, initial encounter [T82.9XXA] Patient Active Problem  List   Diagnosis Date Noted   Obesity, Class II, BMI 35-39.9 06/11/2022   Clotted dialysis access ([place]) 06/10/2022   Constipation 05/28/2022   Nausea & vomiting  05/28/2022   End stage renal disease on dialysis ([place]) 05/28/2022   Anemia of chronic disease 05/28/2022   Pancreatic pseudocyst 05/28/2022   Elevated lipase 05/28/2022   Hyponatremia 05/28/2022   Type 2 diabetes mellitus ([place]) 05/28/2022   Hypoalbuminemia due to protein-calorie malnutrition ([place]) 05/28/2022   Idiopathic hypotension    Cavitary pneumonia    Leukocytosis    Bile duct abnormality    Abnormal CT of the abdomen    Shock ([place])    DKA (diabetic ketoacidosis) ([place]) 04/10/2022   Acute pancreatitis 04/10/2022   SIRS (systemic inflammatory response syndrome) ([place]) 04/10/2022   Hypertriglyceridemia 04/10/2022   Hypercholesterolemia 04/10/2022   Lactic acidosis 04/10/2022   Abdominal bloating 02/17/2022   Encounter for screening colonoscopy 06/17/2021   Fatty liver 03/05/2021   Chronic cholecystitis 02/24/2021   Exercise hypoxemia 06/03/2020   OSA on CPAP 06/03/2020   Healthcare maintenance 06/03/2020   ILD (interstitial lung disease) ([place]) 10/02/2019   GERD (gastroesophageal reflux disease) 10/02/2019   COPD with acute exacerbation ([place]) 09/28/2019   AKI on CKD stage 3b 09/19/2019   Acute respiratory failure with hypoxia (HCC)    Chronic pain syndrome    History of COVID-19 08/29/2019   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   Acute renal failure ([place]) 03/26/2018   Rash 03/26/2018   Acute kidney failure, unspecified ([place]) 03/26/2018   Chest pain 03/08/2018   CKD (chronic kidney disease), stage III ([place]) 03/08/2018   COPD (chronic obstructive pulmonary disease) ([place]) 03/08/2018   Chronic kidney disease, stage 3 unspecified ([place]) 03/08/2018   Chronic obstructive pulmonary disease ([place]) 03/08/2018   Avascular necrosis of bone of left hip ([address]) 07/27/2017   Avascular necrosis of bone of right hip ([place]) 06/04/2016   Status post total replacement of right hip 06/04/2016   Avascular necrosis of bone of hip ([place]) 06/04/2016   Hepatitis C 03/21/2015   Viral  hepatitis C 03/21/2015   History of alcohol abuse 11/08/2013   Former smoker 11/08/2013   Tobacco user 11/08/2013   Essential hypertension 11/07/2013   Abnormal ECG 11/07/2013   PCP:  [name], MD Pharmacy:   [place], [place] - [address] [place] [address] [place] [address] Phone: [phone] Fax: [phone]  [place], [place] - [address] [place] [address] Phone: [phone] Fax: [phone]     Social Determinants of Health (SDOH) Interventions    Readmission Risk Interventions     No data to display

## 2022-06-11 NOTE — NC FL2 (Signed)
[place] LEVEL OF CARE SCREENING TOOL     IDENTIFICATION  Patient Name: Mathew Lara Birthdate: 04-Jun-1961 Sex: male Admission Date (Current Location): 06/09/2022  [facility] and [place] Number:  Herbalist and Address:  [facility] [place], [place] [address] [place], [place] [address]      Provider Number: [id number]  Attending Physician Name and Address:  Mendel Corning, MD  Relative Name and Phone Number:  McCollum,Camille (Daughter)   [phone]    Current Level of Care: Hospital Recommended Level of Care: [place] Prior Approval Number:    Date Approved/Denied:   PASRR Number: Pending  Discharge Plan: SNF    Current Diagnoses: Patient Active Problem List   Diagnosis Date Noted   Obesity, Class II, BMI 35-39.9 06/11/2022   Clotted dialysis access ([place]) 06/10/2022   Constipation 05/28/2022   Nausea & vomiting 05/28/2022   End stage renal disease on dialysis ([place]) 05/28/2022   Anemia of chronic disease 05/28/2022   Pancreatic pseudocyst 05/28/2022   Elevated lipase 05/28/2022   Hyponatremia 05/28/2022   Type 2 diabetes mellitus ([place]) 05/28/2022   Hypoalbuminemia due to protein-calorie malnutrition ([place]) 05/28/2022   Idiopathic hypotension    Cavitary pneumonia    Leukocytosis    Bile duct abnormality    Abnormal CT of the abdomen    Shock ([place])    DKA (diabetic ketoacidosis) ([place]) 04/10/2022   Acute pancreatitis 04/10/2022   SIRS (systemic inflammatory response syndrome) ([place]) 04/10/2022   Hypertriglyceridemia 04/10/2022   Hypercholesterolemia 04/10/2022   Lactic acidosis 04/10/2022   Abdominal bloating 02/17/2022   Encounter for screening colonoscopy 06/17/2021   Fatty liver 03/05/2021   Chronic cholecystitis 02/24/2021   Exercise hypoxemia 06/03/2020   OSA on CPAP 06/03/2020   Healthcare maintenance 06/03/2020   ILD (interstitial lung disease) ([place]) 10/02/2019   GERD (gastroesophageal reflux  disease) 10/02/2019   COPD with acute exacerbation ([place]) 09/28/2019   AKI on CKD stage 3b 09/19/2019   Acute respiratory failure with hypoxia (HCC)    Chronic pain syndrome    History of COVID-19 08/29/2019   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   Acute renal failure ([place]) 03/26/2018   Rash 03/26/2018   Acute kidney failure, unspecified ([place]) 03/26/2018   Chest pain 03/08/2018   CKD (chronic kidney disease), stage III ([place]) 03/08/2018   COPD (chronic obstructive pulmonary disease) ([place]) 03/08/2018   Chronic kidney disease, stage 3 unspecified ([place]) 03/08/2018   Chronic obstructive pulmonary disease ([place]) 03/08/2018   Avascular necrosis of bone of left hip ([place]) 07/27/2017   Avascular necrosis of bone of right hip ([place]) 06/04/2016   Status post total replacement of right hip 06/04/2016   Avascular necrosis of bone of hip ([place]) 06/04/2016   Hepatitis C 03/21/2015   Viral hepatitis C 03/21/2015   History of alcohol abuse 11/08/2013   Former smoker 11/08/2013   Tobacco user 11/08/2013   Essential hypertension 11/07/2013   Abnormal ECG 11/07/2013    Orientation RESPIRATION BLADDER Height & Weight     Self, Time, Situation, Place  O2 Continent Weight:   Height:  '5\' 8"'$  (172.7 cm)  BEHAVIORAL SYMPTOMS/MOOD NEUROLOGICAL BOWEL NUTRITION STATUS      Continent Diet (See DC Summary)  AMBULATORY STATUS COMMUNICATION OF NEEDS Skin   Limited Assist Verbally Normal                       Personal Care Assistance Level of  Assistance  Bathing, Feeding, Dressing Bathing Assistance: Limited assistance Feeding assistance: Independent Dressing Assistance: Limited assistance     Functional Limitations Info  Sight, Hearing, Speech Sight Info: Impaired Hearing Info: Adequate Speech Info: Adequate    SPECIAL CARE FACTORS FREQUENCY  PT (By licensed PT), OT (By licensed OT)     PT Frequency: 5x a week OT Frequency: 5x week            Contractures Contractures  Info: Not present    Additional Factors Info  Code Status, Allergies Code Status Info: Full Allergies Info: NKA           Current Medications (06/11/2022):  This is the current hospital active medication list Current Facility-Administered Medications  Medication Dose Route Frequency Provider Last Rate Last Admin   acetaminophen (TYLENOL) tablet 650 mg  650 mg Oral Q6H PRN Samella Parr, NP   650 mg at 06/10/22 1328   Or   acetaminophen (TYLENOL) suppository 650 mg  650 mg Rectal Q6H PRN Samella Parr, NP       arformoterol (BROVANA) nebulizer solution 15 mcg  15 mcg Nebulization BID Samella Parr, NP   15 mcg at 06/11/22 1914   aspirin EC tablet 81 mg  81 mg Oral Q breakfast Samella Parr, NP   81 mg at 06/11/22 1042   atorvastatin (LIPITOR) tablet 80 mg  80 mg Oral QHS Curatolo, Adam, DO   80 mg at 06/10/22 2126   ceFAZolin (ANCEF) 2-4 GM/100ML-% IVPB            Chlorhexidine Gluconate Cloth 2 % PADS 6 each  6 each Topical Q0600 Roney Jaffe, MD   6 each at 06/11/22 0630   fentaNYL (SUBLIMAZE) 100 MCG/2ML injection            Ferrous Fumarate (HEMOCYTE - 106 mg FE) tablet 106 mg of iron  1 tablet Oral Daily Samella Parr, NP   106 mg of iron at 06/11/22 1042   guaiFENesin (ROBITUSSIN) 100 MG/5ML liquid 5 mL  5 mL Oral Q4H PRN Damita Lack, MD       [START ON 06/12/2022] heparin injection 2,500 Units  2,500 Units Dialysis PRN Roney Jaffe, MD       heparin injection 5,000 Units  5,000 Units Subcutaneous Q8H Samella Parr, NP   5,000 Units at 06/10/22 2127   hydrALAZINE (APRESOLINE) injection 10 mg  10 mg Intravenous Q4H PRN Amin, Ankit Chirag, MD       insulin aspart (novoLOG) injection 0-6 Units  0-6 Units Subcutaneous TID AC Amin, Ankit Chirag, MD       ipratropium-albuterol (DUONEB) 0.5-2.5 (3) MG/3ML nebulizer solution 3 mL  3 mL Nebulization Q4H PRN Amin, Ankit Chirag, MD       metoprolol tartrate (LOPRESSOR) injection 5 mg  5 mg Intravenous Q4H PRN  Amin, Ankit Chirag, MD       midazolam (VERSED) 2 MG/2ML injection            midodrine (PROAMATINE) tablet 10 mg  10 mg Oral TID WC Samella Parr, NP   10 mg at 06/11/22 1042   multivitamin (RENA-VIT) tablet 1 tablet  1 tablet Oral QHS [place] Jude, [facility]   1 tablet at 06/10/22 2127   ondansetron (ZOFRAN) tablet 4 mg  4 mg Oral Q6H PRN Samella Parr, NP       Or   ondansetron [facility]) injection 4 mg  4 mg Intravenous Q6H PRN Erin Hearing  L, NP       pantoprazole (PROTONIX) EC tablet 40 mg  40 mg Oral BID AC Curatolo, Adam, DO   40 mg at 06/10/22 1742   polyethylene glycol (MIRALAX / GLYCOLAX) packet 17 g  17 g Oral BID Samella Parr, NP   17 g at 06/10/22 2126   QUEtiapine (SEROQUEL) tablet 25 mg  25 mg Oral QHS Curatolo, Adam, DO   25 mg at 06/10/22 2126   senna-docusate (Senokot-S) tablet 2 tablet  2 tablet Oral QHS Samella Parr, NP   2 tablet at 06/10/22 2126   sevelamer carbonate (RENVELA) tablet 2,400 mg  2,400 mg Oral TID WC Samella Parr, NP       simethicone [facility]) chewable tablet 80 mg  80 mg Oral Q4H PRN Samella Parr, NP       sodium chloride flush (NS) 0.9 % injection 3 mL  3 mL Intravenous Q12H Samella Parr, NP   3 mL at 06/10/22 2127   traZODone (DESYREL) tablet 50 mg  50 mg Oral QHS PRN Damita Lack, MD   50 mg at 06/10/22 2127   zolpidem (AMBIEN) tablet 5 mg  5 mg Oral QHS PRN Samella Parr, NP   5 mg at 06/10/22 2127     Discharge Medications: Please see discharge summary for a list of discharge medications.  Relevant Imaging Results:  Relevant Lab Results:   Additional Information SSN:  [id number] ;  5'8" 268lbs  Reece Agar, LCSWA

## 2022-06-11 NOTE — Progress Notes (Signed)
Mobility Specialist Progress Note:   06/11/22 1147  Mobility  Activity Ambulated with assistance in hallway  Activity Response Tolerated well  Distance Ambulated (ft) 450 ft  $Mobility charge 1 Mobility  Level of Assistance Standby assist, set-up cues, supervision of patient - no hands on  Assistive Device Front wheel walker  Mobility Referral Yes   Pt received in bed and agreeable. No complaints. Pt left sitting EOB with all needs met and call bell in reach.   Ijeoma Loor Mobility Specialist-Acute Rehab Secure Chat only

## 2022-06-11 NOTE — Procedures (Signed)
Interventional Radiology Procedure:   Indications: Non-functioning left jugular dialysis catheter  Procedure: Tunneled dialysis catheter exchange  Findings: New 23 cm tip to cuff Palindrome, tip near SVC/RA junction.  Residual thrombus in right IJ  Complications: No immediate complications noted.     EBL: Minimal  Plan: Dialysis catheter is ready to use.    Malani Lees R. Anselm Pancoast, MD  Pager: [phone]

## 2022-06-11 NOTE — Evaluation (Signed)
Physical Therapy Evaluation Patient Details Name: Mathew Lara MRN: [id number] DOB: 1961/06/09 Today's Date: 06/11/2022  History of Present Illness  61 y.o. male presents to [facility] hospital on 06/09/2022 with clotted dialysis access site. Pt recently discharged on 9/26 after being admitted for constipation and abdominal pain. PMH includes ESRD, anemia, pancreatitis, DMII, COPD, HTN, hep C, HLD.  Clinical Impression  Pt presents to PT without significant deficits in mobility at this time. Pt is able to ambulate at a modI level with use of RW, and requires no physical assistance to ambulate without an assistive device. Pt reports he had been working on situps, squats and curls at SNF recently prior to this hospitalization. Pt does not demonstrate the need for further PT services at this time. No PT follow-up is recommended.       Recommendations for follow up therapy are one component of a multi-disciplinary discharge planning process, led by the attending physician.  Recommendations may be updated based on patient status, additional functional criteria and insurance authorization.  Follow Up Recommendations No PT follow up Can patient physically be transported by private vehicle: Yes    Assistance Recommended at Discharge Intermittent Supervision/Assistance  Patient can return home with the following  Direct supervision/assist for medications management;Direct supervision/assist for financial management;Assist for transportation    Equipment Recommendations None recommended by PT  Recommendations for Other Services       Functional Status Assessment Patient has not had a recent decline in their functional status     Precautions / Restrictions Precautions Precautions: Fall Restrictions Weight Bearing Restrictions: No      Mobility  Bed Mobility Overal bed mobility: Independent                  Transfers Overall transfer level: Independent                       Ambulation/Gait Ambulation/Gait assistance: Modified independent (Device/Increase time), Supervision Gait Distance (Feet): 200 Feet (200' without device, 100' with RW) Assistive device: Rolling walker (2 wheels), None Gait Pattern/deviations: Step-through pattern Gait velocity: Decreased Gait velocity interpretation: 1.31 - 2.62 ft/sec, indicative of limited community ambulator   General Gait Details: pt with steady step-through gait with walker, mild drift without device but no LOB observed  Stairs            Wheelchair Mobility    Modified Rankin (Stroke Patients Only)       Balance Overall balance assessment: Modified Independent Sitting-balance support: No upper extremity supported, Feet supported       Standing balance support: No upper extremity supported, During functional activity Standing balance-Leahy Scale: Good                               Pertinent Vitals/Pain Pain Assessment Pain Assessment: No/denies pain    Home Living Family/patient expects to be discharged to:: Skilled nursing facility (goal for long term placement vs ALF)                   Additional Comments: pt was living at home with his older sister prior to hospital admission in August of 2023, since has been at [facility]    Prior Function Prior Level of Function : Independent/Modified Independent             Mobility Comments: PRN use of cane at baseline, reports working on squats and situps recently at [facility]  Hand Dominance   Dominant Hand: Right    Extremity/Trunk Assessment   Upper Extremity Assessment Upper Extremity Assessment: Overall WFL for tasks assessed (ROM of LUE limited during eval due to recent HDU catheter exchange)    Lower Extremity Assessment Lower Extremity Assessment: Overall WFL for tasks assessed    Cervical / Trunk Assessment Cervical / Trunk Assessment: Normal  Communication   Communication: No difficulties  Cognition  Arousal/Alertness: Awake/alert Behavior During Therapy: WFL for tasks assessed/performed, Impulsive Overall Cognitive Status: Within Functional Limits for tasks assessed                                          General Comments General comments (skin integrity, edema, etc.): VSS on RA, pt asymptomatic    Exercises     Assessment/Plan    PT Assessment Patient does not need any further PT services  PT Problem List         PT Treatment Interventions      PT Goals (Current goals can be found in the Care Plan section)  Additional Goals Additional Goal #1: Pt will score >45/56 on the BERG to indicate a reduced risk for falls Additional Goal #2: Pt will score >19/24 on the DGI to indicate a reduced risk for falls    Frequency       Co-evaluation               AM-PAC PT "6 Clicks" Mobility  Outcome Measure Help needed turning from your back to your side while in a flat bed without using bedrails?: None Help needed moving from lying on your back to sitting on the side of a flat bed without using bedrails?: None Help needed moving to and from a bed to a chair (including a wheelchair)?: None Help needed standing up from a chair using your arms (e.g., wheelchair or bedside chair)?: None Help needed to walk in hospital room?: None Help needed climbing 3-5 steps with a railing? : None 6 Click Score: 24    End of Session   Activity Tolerance: Patient tolerated treatment well Patient left: with call bell/phone within reach;with bed alarm set;in bed Nurse Communication: Mobility status PT Visit Diagnosis: Other abnormalities of gait and mobility (R26.89)    Time: 1610-9604 PT Time Calculation (min) (ACUTE ONLY): 12 min   Charges:   PT Evaluation $PT Eval Low Complexity: 1 Low          Zenaida Niece, PT, DPT Acute Rehabilitation Office [phone]   Zenaida Niece 06/11/2022, 4:46 PM

## 2022-06-11 NOTE — Care Management Obs Status (Signed)
[place] NOTIFICATION   Patient Details  Name: Mathew Lara MRN: [id number] Date of Birth: May 13, 1961   Medicare Observation Status Notification Given:  Yes    Tom-Johnson, Renea Ee, RN 06/11/2022, 1:47 PM

## 2022-06-11 NOTE — Progress Notes (Signed)
[facility] Progress Note   Subjective:   Patient seen and examined at bedside.  No specific complaints.  Waiting to go to IR for [facility] exchange.  Denies CP, SOB, abdominal pain, n/v/d, weakness, dizziness and fatigue.   Objective Vitals:   06/11/22 1450 06/11/22 1500 06/11/22 1505 06/11/22 1510  BP: (!) 124/93 (!) 123/97 (!) 146/97 (!) 143/91  Pulse: 88 92 94 94  Resp: '16 16 16 16  '$ Temp:      TempSrc:      SpO2: 100% 100% 100% 100%  Height:       Physical Exam General:well appearing male in NAD Heart:RRR, no mrg Lungs:CTAB, nmL WOB on RA Abdomen:soft, NTND Extremities:no LE edema Dialysis Access: [facility] c/d/i   There were no vitals filed for this visit.  Intake/Output Summary (Last 24 hours) at 06/11/2022 1515 Last data filed at 06/11/2022 1300 Gross per 24 hour  Intake 780 ml  Output 0 ml  Net 780 ml    Additional Objective Labs: Basic Metabolic Panel: Recent Labs  Lab 06/09/22 1535 06/10/22 1149 06/11/22 0343  NA 139 137 139  K 4.1 4.2 3.6  CL 99 100 100  CO2 '26 23 23  '$ GLUCOSE 119* 89 111*  BUN 27* 32* 36*  CREATININE 6.10* 5.91* 5.82*  CALCIUM 9.8 9.7 9.4  PHOS  --  4.6 4.7*   Liver Function Tests: Recent Labs  Lab 06/10/22 1149 06/11/22 0343  ALBUMIN 3.2* 2.8*    CBC: Recent Labs  Lab 06/09/22 1535 06/11/22 0343  WBC 12.7* 9.6  NEUTROABS 10.4*  --   HGB 8.4* 8.5*  HCT 27.6* 28.0*  MCV 91.4 89.2  PLT 343 318   CBG: Recent Labs  Lab 06/10/22 1611 06/10/22 2017 06/11/22 0719 06/11/22 1122 06/11/22 1346  GLUCAP 91 103* 104* 70 97    Studies/Results: DG Chest 2 View  Result Date: 06/09/2022 CLINICAL DATA:  Dialysis patient with malfunctioning catheter, unable to be dialyzed. EXAM: CHEST - 2 VIEW COMPARISON:  Chest radiograph and CT chest 05/27/2022 FINDINGS: The left-sided dialysis catheter is stable with the tip terminating in the right atrium. The catheter is intact. The cardiomediastinal silhouette is stable. The known  thick-walled cystic lesion in the left upper lobe is not well seen on the current study common better assessed on the CT chest from 05/27/2022. Aeration of the lungs is unchanged since the radiographs from 05/27/2022 with no new or worsening focal airspace disease. Linear opacities in the right midlung likely reflect scarring. There is no pulmonary edema. There is no pleural effusion or pneumothorax There is no acute osseous abnormality. IMPRESSION: 1. Stable left-sided dialysis catheter which appears intact. 2. Stable aeration of the lungs with no new or worsening focal airspace disease. The known thick-walled cavitary lesion in the left upper lobe is not well seen on the current study. Electronically Signed   By: Valetta Mole M.D.   On: 06/09/2022 16:16    Medications:  ceFAZolin     ceFAZolin      arformoterol  15 mcg Nebulization BID   aspirin EC  81 mg Oral Q breakfast   atorvastatin  80 mg Oral QHS   Chlorhexidine Gluconate Cloth  6 each Topical Q0600   fentaNYL       Ferrous Fumarate  1 tablet Oral Daily   heparin  5,000 Units Subcutaneous Q8H   insulin aspart  0-6 Units Subcutaneous TID AC   midazolam       midodrine  10 mg Oral  TID WC   multivitamin  1 tablet Oral QHS   pantoprazole  40 mg Oral BID AC   polyethylene glycol  17 g Oral BID   QUEtiapine  25 mg Oral QHS   senna-docusate  2 tablet Oral QHS   sevelamer carbonate  2,400 mg Oral TID WC   sodium chloride flush  3 mL Intravenous Q12H    Dialysis Orders: MWF [facility]  4h  400/600   112.5kg  2/2 bath  Hep 2500  LIJ TDC - last HD 10/2, post wt 113.5kg - mircera 120 ug q 4 wks, last dose 9/27 (50 mcg) - last Hb 8.7 on 10/2 - pth low at 16 on 9/27     8/29 - IR placement of LIJ TDC; 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 Pt is admitted, and IR consulted for [facility] replacement.  To be completed  today with HD to follow.  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 Hoping for recovery of renal function still, but labs do not support this yet - this has been explained to patient.  HD today following TDC exchange.  Continue on MWF schedule.  BP/ vol - euvolemic on exam, no edema, clear lungs. BP in normal range without medications.  COPD/ OSA DM2 - per pmd Anemia ckd - Hb 8.5 here, ESA recently dosed.  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  Not on binders or VDRA.  Nutrition - NPO until after procedure.  Renal diet w/fluid restrictions once eating.     Jen Mow, PA-C [facility] 06/11/2022,3:15 PM  LOS: 0 days

## 2022-06-12 DIAGNOSIS — Z7401 Bed confinement status: Secondary | ICD-10-CM | POA: Diagnosis not present

## 2022-06-12 DIAGNOSIS — N186 End stage renal disease: Secondary | ICD-10-CM | POA: Diagnosis not present

## 2022-06-12 DIAGNOSIS — Z992 Dependence on renal dialysis: Secondary | ICD-10-CM | POA: Diagnosis not present

## 2022-06-12 DIAGNOSIS — T8249XA Other complication of vascular dialysis catheter, initial encounter: Secondary | ICD-10-CM | POA: Diagnosis not present

## 2022-06-12 DIAGNOSIS — R58 Hemorrhage, not elsewhere classified: Secondary | ICD-10-CM | POA: Diagnosis not present

## 2022-06-12 DIAGNOSIS — J410 Simple chronic bronchitis: Secondary | ICD-10-CM | POA: Diagnosis not present

## 2022-06-12 DIAGNOSIS — R531 Weakness: Secondary | ICD-10-CM | POA: Diagnosis not present

## 2022-06-12 DIAGNOSIS — T829XXA Unspecified complication of cardiac and vascular prosthetic device, implant and graft, initial encounter: Secondary | ICD-10-CM | POA: Diagnosis not present

## 2022-06-12 LAB — BASIC METABOLIC PANEL
Anion gap: 14 (ref 5–15)
BUN: 14 mg/dL (ref 8–23)
CO2: 27 mmol/L (ref 22–32)
Calcium: 9.1 mg/dL (ref 8.9–10.3)
Chloride: 95 mmol/L — ABNORMAL LOW (ref 98–111)
Creatinine, Ser: 2.68 mg/dL — ABNORMAL HIGH (ref 0.61–1.24)
GFR, Estimated: 26 mL/min — ABNORMAL LOW (ref >60)
Glucose, Bld: 129 mg/dL — ABNORMAL HIGH (ref 70–99)
Potassium: 3.1 mmol/L — ABNORMAL LOW (ref 3.5–5.1)
Sodium: 136 mmol/L (ref 135–145)

## 2022-06-12 LAB — CBC
HCT: 27.9 % — ABNORMAL LOW (ref 39.0–52.0)
Hemoglobin: 9 g/dL — ABNORMAL LOW (ref 13.0–17.0)
MCH: 28.6 pg (ref 26.0–34.0)
MCHC: 32.3 g/dL (ref 30.0–36.0)
MCV: 88.6 fL (ref 80.0–100.0)
Platelets: 366 10*3/uL (ref 150–400)
RBC: 3.15 MIL/uL — ABNORMAL LOW (ref 4.22–5.81)
RDW: 16.9 % — ABNORMAL HIGH (ref 11.5–15.5)
WBC: 12 10*3/uL — ABNORMAL HIGH (ref 4.0–10.5)
nRBC: 0 % (ref 0.0–0.2)

## 2022-06-12 LAB — MAGNESIUM: Magnesium: 1.6 mg/dL — ABNORMAL LOW (ref 1.7–2.4)

## 2022-06-12 LAB — GLUCOSE, CAPILLARY
Glucose-Capillary: 137 mg/dL — ABNORMAL HIGH (ref 70–99)
Glucose-Capillary: 109 mg/dL — ABNORMAL HIGH (ref 70–99)

## 2022-06-12 MED ORDER — OXYCODONE HCL 5 MG PO TABS: 5.0000 mg | ORAL_TABLET | Freq: Three times a day (TID) | ORAL | 0 refills | Status: DC | PRN

## 2022-06-12 MED ORDER — HEPARIN SODIUM (PORCINE) 1000 UNIT/ML IJ SOLN
INTRAMUSCULAR | Status: AC
  Administered 2022-06-12: 1000 [IU]
  Filled 2022-06-12: qty 4

## 2022-06-12 NOTE — TOC Transition Note (Signed)
Transition of Care [facility]) - CM/SW Discharge Note   Patient Details  Name: Mathew Lara MRN: [id number] Date of Birth: 29-Jul-1961  Transition of Care [facility]) CM/SW Contact:  Amador Cunas, [place] Phone Number: 06/12/2022, 12:09 PM   Clinical Narrative:   Pt stable for dc back to [facility] today. Spoke to [place] in admissions who confirmed pt is able to return to room B23. [place] auth request for SNF submitted (ref # [id number]) as a courtesy but pt is able to return regardless on determination. Pt aware of dc and reports agreeable. RN provided with number for report and [facility] called for transport. SW signing off at dc.   Wandra Feinstein, MSW, LCSW [phone] (coverage)      Final next level of care: Skilled Nursing Facility Barriers to Discharge: No Barriers Identified   Patient Goals and CMS Choice Patient states their goals for this hospitalization and ongoing recovery are:: To return to rehab. CMS Medicare.gov Compare Post Acute Care list provided to:: Patient Choice offered to / list presented to : Patient  Discharge Placement              Patient chooses bed at: Other - please specify in the comment section below: [facility]) Patient to be transferred to facility by: [place] Name of family member notified: Pt to update family Patient and family notified of of transfer: 06/12/22  Discharge Plan and Services In-house Referral: Clinical Social Work Discharge Planning Services: CM Consult Post Acute Care Choice: [place]          DME Arranged: N/A DME Agency: NA       HH Arranged: NA HH Agency: NA        Social Determinants of Health (SDOH) Interventions     Readmission Risk Interventions     No data to display

## 2022-06-12 NOTE — Progress Notes (Signed)
DISCHARGE NOTE SNF [facility] to be discharged [place] per MD order. Patient verbalized understanding.  Skin clean, dry and intact without evidence of skin break down, no evidence of skin tears noted. IV catheter discontinued intact. Site without signs and symptoms of complications. Dressing and pressure applied. Pt denies pain at the site currently. No complaints noted.  Patient free of lines, drains, and wounds.   Discharge packet assembled. An After Visit Summary (AVS) was printed and given to the EMS personnel. Patient escorted via stretcher and discharged to [facility] via ambulance. Report called to accepting facility; all questions and concerns addressed.   Berneta Levins, RN

## 2022-06-12 NOTE — Progress Notes (Addendum)
[facility] Progress Note   Subjective:   Patient seen and examined at bedside.  Reports tolerating dialysis well overnight using TDC.  No CP, SOB, abdominal pain or n/v/d.   Objective Vitals:   06/12/22 0523 06/12/22 0548 06/12/22 0811 06/12/22 0845  BP: (!) 87/63 115/85 116/82   Pulse: (!) 118 (!) 118 (!) 109 (!) 105  Resp: '18 18 18 18  '$ Temp: 98.5 F (36.9 C) 98.7 F (37.1 C) 99.1 F (37.3 C)   TempSrc: Oral Oral Oral   SpO2: 95% 96% 96% 97%  Weight:      Height:       Physical Exam General: WDWN male in NAD Heart:RRR, no mrg Lungs:CTAB, nml WOB on RA Abdomen:soft, NTND Extremities:no LE edema Dialysis Access: [facility] c/d/i   [facility] Weights   06/11/22 2330 06/12/22 0405  Weight: 113.6 kg 112.5 kg    Intake/Output Summary (Last 24 hours) at 06/12/2022 1013 Last data filed at 06/12/2022 0812 Gross per 24 hour  Intake 467 ml  Output 1100 ml  Net -633 ml    Additional Objective Labs: Basic Metabolic Panel: Recent Labs  Lab 06/10/22 1149 06/11/22 0343 06/12/22 0535  NA 137 139 136  K 4.2 3.6 3.1*  CL 100 100 95*  CO2 '23 23 27  '$ GLUCOSE 89 111* 129*  BUN 32* 36* 14  CREATININE 5.91* 5.82* 2.68*  CALCIUM 9.7 9.4 9.1  PHOS 4.6 4.7*  --    Liver Function Tests: Recent Labs  Lab 06/10/22 1149 06/11/22 0343  ALBUMIN 3.2* 2.8*    CBC: Recent Labs  Lab 06/09/22 1535 06/11/22 0343 06/12/22 0535  WBC 12.7* 9.6 12.0*  NEUTROABS 10.4*  --   --   HGB 8.4* 8.5* 9.0*  HCT 27.6* 28.0* 27.9*  MCV 91.4 89.2 88.6  PLT 343 318 366    CBG: Recent Labs  Lab 06/11/22 1346 06/11/22 1539 06/11/22 1735 06/11/22 2110 06/12/22 0811  GLUCAP 97 96 124* 120* 137*    Studies/Results: IR Fluoro Guide CV Line Left  Result Date: 06/11/2022 INDICATION: 61 year old with end-stage renal disease. Nonfunctioning left chest tunneled dialysis catheter. EXAM: EXCHANGE OF TUNNELED DIALYSIS CATHETER WITH FLUOROSCOPY Physician: Stephan Minister. Anselm Pancoast, MD MEDICATIONS:  Ancef 2 g; The antibiotic was administered within an appropriate time interval prior to skin puncture. ANESTHESIA/SEDATION: Moderate (conscious) sedation was employed during this procedure. A total of Versed 1.'5mg'$  and fentanyl 75 mcg was administered intravenously at the order of the provider performing the procedure. Total intra-service moderate sedation time: 21 minutes. Patient's level of consciousness and vital signs were monitored continuously by radiology nurse throughout the procedure under the supervision of the provider performing the procedure. FLUOROSCOPY: Radiation Exposure Index (as provided by the fluoroscopic device): 10 mGy Kerma COMPLICATIONS: None immediate. PROCEDURE: The procedure was explained to the patient. The risks and benefits of the procedure were discussed and the patient's questions were addressed. Informed consent was obtained from the patient. The left chest and existing catheter were prepped and draped in sterile fashion. Maximal barrier sterile technique was utilized including caps, mask, sterile gowns, sterile gloves, sterile drape, hand hygiene and skin antiseptic. Unable to aspirate from the blue port. Blood was aspirated from the red port. Skin around the catheter was anesthetized using 1% lidocaine. The catheter cuff was exposed with blunt dissection. The old catheter was removed over a stiff Glidewire. 5 French catheter was used to advance the Glidewire into the IVC. New 23 cm tip to cuff Palindrome catheter was easily advanced  over the wire and the tip was positioned near the superior cavoatrial junction. The cuff was placed underneath the skin. Both lumens aspirated and flushed very well. Appropriate amount of heparin was placed in both lumens. Catheter was secured to the skin with Prolene suture. Dressing was placed. FINDINGS: New catheter tip is positioned at the superior cavoatrial junction. IMPRESSION: Successful exchange of the left chest tunneled dialysis catheter with  fluoroscopy. Electronically Signed   By: Markus Daft M.D.   On: 06/11/2022 17:19    Medications:   arformoterol  15 mcg Nebulization BID   aspirin EC  81 mg Oral Q breakfast   atorvastatin  80 mg Oral QHS   Chlorhexidine Gluconate Cloth  6 each Topical Q0600   Ferrous Fumarate  1 tablet Oral Daily   heparin  5,000 Units Subcutaneous Q8H   heparin sodium (porcine)       insulin aspart  0-6 Units Subcutaneous TID AC   midodrine  10 mg Oral TID WC   multivitamin  1 tablet Oral QHS   pantoprazole  40 mg Oral BID AC   polyethylene glycol  17 g Oral BID   QUEtiapine  25 mg Oral QHS   senna-docusate  2 tablet Oral QHS   sevelamer carbonate  2,400 mg Oral TID WC   sodium chloride flush  3 mL Intravenous Q12H    Dialysis Orders: MWF [place]  4h  400/600   112.5kg  2/2 bath  Hep 2500  LIJ TDC - last HD 10/2, post wt 113.5kg - mircera 120 ug q 4 wks, last dose 9/27 (50 mcg) - last Hb 8.7 on 10/2 - pth low at 16 on 9/27      8/29 - IR placement of LIJ TDC; 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 IR replaced TDC yesterday and successful use overnight.  Appreciate IR assistance.  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 Hoping for recovery of renal function still, but labs do not support this yet - this has been explained to patient.  HD overnight following TDC exchange.  Continue on MWF schedule.  BP/ vol - euvolemic on exam, no edema, clear lungs. BP in normal range without medications.  COPD/ OSA DM2 - per pmd Anemia ckd - Hb 9.0 here, ESA recently dosed.  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  Not on binders or VDRA.  Nutrition - Renal diet w/fluid restrictions  Dispo - ok for d/c from renal standpoint.   Jen Mow, PA-C [facility] 06/12/2022,10:13 AM  LOS: 0 days

## 2022-06-12 NOTE — Progress Notes (Signed)
[place] gave report to [place].

## 2022-06-12 NOTE — Progress Notes (Signed)
   06/12/22 0405  Vitals  Temp 98.6 F (37 C)  BP (!) 134/118  Pulse Rate (!) 116  Resp 20  Post Treatment  Dialyzer Clearance Heavily streaked  Duration of HD Treatment -hour(s) 4 hour(s)  Liters Processed 86.7  Fluid Removed 1100 mL  Tolerated HD Treatment Yes      PT states HR seems to rise as treatments  goes on at his unit as well. ? Poss. Filtering out some med.s ? TX fin. W/o difficulty.

## 2022-06-12 NOTE — Progress Notes (Signed)
   06/12/22 0548  Assess: MEWS Score  Temp 98.7 F (37.1 C)  BP 115/85  MAP (mmHg) 94  Pulse Rate (!) 118  Resp 18  SpO2 96 %  O2 Device Room Air  Assess: MEWS Score  MEWS Temp 0  MEWS Systolic 0  MEWS Pulse 2  MEWS RR 0  MEWS LOC 0  MEWS Score 2  MEWS Score Color Yellow  Assess: if the MEWS score is Yellow or Red  Were vital signs taken at a resting state? Yes  Focused Assessment No change from prior assessment  Does the patient meet 2 or more of the SIRS criteria? Yes  Does the patient have a confirmed or suspected source of infection? No  MEWS guidelines implemented *See Row Information* Yes  Treat  MEWS Interventions Administered scheduled meds/treatments  Pain Scale 0-10  Pain Score 0  Take Vital Signs  Increase Vital Sign Frequency  Yellow: Q 2hr X 2 then Q 4hr X 2, if remains yellow, continue Q 4hrs  Escalate  MEWS: Escalate Yellow: discuss with charge nurse/RN and consider discussing with provider and RRT  Notify: Charge Nurse/RN  Name of Charge Nurse/RN Notified Nikki, RN  Date Charge Nurse/RN Notified 06/12/22  Time Charge Nurse/RN Notified 0523  Notify: Provider  Provider Name/Title [place], MD  Date Provider Notified 06/12/22  Time Provider Notified [phone]  Method of Notification Page  Notification Reason Change in status (yellow MEWS)  Provider response No new orders  Date of Provider Response 06/12/22  Time of Provider Response 0610  Assess: SIRS CRITERIA  SIRS Temperature  0  SIRS Pulse 1  SIRS Respirations  0  SIRS WBC 1  SIRS Score Sum  2

## 2022-06-12 NOTE — Discharge Summary (Signed)
Physician Discharge Summary   Patient: Mathew Lara MRN: [id number] DOB: 01-Aug-1961  Admit date:     06/09/2022  Discharge date: 06/12/22  Discharge Physician: Estill Cotta, MD    PCP: [name], MD   Recommendations at discharge:   Continue hemodialysis Monday Wednesday Friday schedule  Discharge Diagnoses:    Clotted dialysis access  [facility])   COPD (chronic obstructive pulmonary disease) ([place])   OSA on CPAP   End stage renal disease on dialysis ([place])   Type 2 diabetes mellitus ([place])   Obesity, Class II, BMI 35-39.9    Hospital Course: Patient is a 61 year old male with ES being on hemodialysis, MWF, chronic constipation, anemia, recent acute pancreatitis 2 weeks prior with associated pseudocyst related to chronic pancreatitis, IDDM type II, remote h/o alcohol abuse/polysubstance abuse, COPD, sleep apnea, hypertension, history of hepatitis C, dyslipidemia.  Recently discharged on on 9/26 after admission for abdominal pain, severe constipation, started on scheduled Senokot, MiraLAX significant improvement in symptoms per patient.  Patient presented to dialysis on 10/4, on Wednesday but his usual but they were unable to utilize his dialysis access due to clotting and was sent to ER. EDP, discussed with nephrology, vascular surgery and IR.  Patient was admitted for further work-up.   Assessment and Plan:   Clotted dialysis access [facility]), recurrent issue -IR, vascular surgery was consulted. -Patient had tunneled dialysis catheter exchange done by interventional radiology on 10/6 -After the procedure, patient received hemodialysis successfully -He is now cleared to be discharged from renal standpoint     End stage renal disease on dialysis [facility]) MWF -Patient received hemodialysis yesterday after the tunneled dialysis catheter exchange -He will receive his next dialysis outpatient on Monday, 06/14/2022     COPD (chronic obstructive pulmonary disease) ([place]) -Stable, no  wheezing     OSA on CPAP -Continue CPAP qhs     Type 2 diabetes mellitus ([place]), uncontrolled, with ESRD -Continue sliding scale insulin -Hemoglobin A1c 6.8 on 06/10/22   HLP -Continue Lipitor   Chronic constipation -Continue Senokot S2 tabs daily at bedtime, MiraLAX twice daily       Obesity, Class II, BMI 35-39.9 -Estimated body mass index is 38.38 kg/m as calculated from the following:   Height as of this encounter: '5\' 8"'$  (1.727 m).   Weight as of 05/31/22: 114.5 kg.      Pain control - [employer] Controlled Substance Reporting System database was reviewed. and patient was instructed, not to drive, operate heavy machinery, perform activities at heights, swimming or participation in water activities or provide baby-sitting services while on Pain, Sleep and Anxiety Medications; until their outpatient Physician has advised to do so again. Also recommended to not to take more than prescribed Pain, Sleep and Anxiety Medications.  Consultants: Surgery, interventional radiology, nephrology Procedures performed: Tunneled dialysis catheter exchange for the nonfunctional left jugular dialysis catheter Disposition: Skilled nursing facility Diet recommendation:  Discharge Diet Orders (From admission, onward)     Start     Ordered   06/12/22 0000  Diet - low sodium heart healthy        06/12/22 1055           Carb modified diet DISCHARGE MEDICATION: Allergies as of 06/12/2022   No Known Allergies      Medication List     TAKE these medications    acetaminophen 325 MG tablet Commonly known as: TYLENOL Take 2 tablets (650 mg total) by mouth every 6 (six) hours as needed for mild pain (or  Fever >/= 101).   albuterol (2.5 MG/3ML) 0.083% nebulizer solution Commonly known as: PROVENTIL Take 3 mLs (2.5 mg total) by nebulization every 4 (four) hours as needed for wheezing or shortness of breath.   arformoterol 15 MCG/2ML Nebu Commonly known as: BROVANA Take 2 mLs (15 mcg  total) by nebulization 2 (two) times daily.   aspirin EC 81 MG tablet Take 1 tablet (81 mg total) by mouth daily with breakfast.   atorvastatin 80 MG tablet Commonly known as: LIPITOR Take 1 tablet (80 mg total) by mouth daily. What changed: when to take this   calcium carbonate 500 MG chewable tablet Commonly known as: TUMS - dosed in mg elemental calcium Chew 1 tablet (200 mg of elemental calcium total) by mouth every 8 (eight) hours as needed for indigestion or heartburn.   DIALYVITE 800 WITH ZINC 0.8 MG Tabs Take 1 tablet by mouth daily.   Ferretts 325 (106 Fe) MG Tabs tablet Generic drug: ferrous fumarate Take 1 tablet by mouth daily.   insulin aspart 100 UNIT/ML injection Commonly known as: novoLOG Inject 1-3 Units into the skin 3 (three) times daily with meals. What changed:  how much to take when to take this additional instructions   insulin detemir 100 UNIT/ML injection Commonly known as: LEVEMIR Inject 0.08 mLs (8 Units total) into the skin 2 (two) times daily.   loratadine 10 MG tablet Commonly known as: CLARITIN Take 1 tablet (10 mg total) by mouth daily.   midodrine 10 MG tablet Commonly known as: PROAMATINE Take 1 tablet (10 mg total) by mouth 3 (three) times daily with meals.   multivitamin tablet Take 1 tablet by mouth daily.   multivitamin Tabs tablet Take 1 tablet by mouth at bedtime.   oxyCODONE 5 MG immediate release tablet Commonly known as: Roxicodone Take 1 tablet (5 mg total) by mouth every 8 (eight) hours as needed for severe pain.   OXYGEN Inhale 2 L into the lungs continuous.   pantoprazole 40 MG tablet Commonly known as: PROTONIX Take 1 tablet (40 mg total) by mouth 2 (two) times daily before a meal. 30 minutes before breakfast   polyethylene glycol 17 g packet Commonly known as: MIRALAX / GLYCOLAX Take 17 g by mouth 2 (two) times daily.   QUEtiapine 25 MG tablet Commonly known as: SEROQUEL Take 1 tablet (25 mg total) by  mouth at bedtime.   revefenacin 175 MCG/3ML nebulizer solution Commonly known as: YUPELRI Take 3 mLs (175 mcg total) by nebulization daily. What changed: how to take this   senna-docusate 8.6-50 MG tablet Commonly known as: Senokot-S Take 2 tablets by mouth at bedtime.   sevelamer carbonate 800 MG tablet Commonly known as: RENVELA Take 3 tablets (2,400 mg total) by mouth 3 (three) times daily with meals.   simethicone 80 MG chewable tablet Commonly known as: MYLICON Chew 1 tablet (80 mg total) by mouth 4 (four) times daily. What changed:  when to take this reasons to take this   sodium chloride 0.65 % Soln nasal spray Commonly known as: OCEAN Place 1 spray into both nostrils as needed for congestion. What changed: when to take this   zolpidem 5 MG tablet Commonly known as: AMBIEN Take 1 tablet (5 mg total) by mouth at bedtime as needed for sleep.        Follow-up Information     [name], MD. Schedule an appointment as soon as possible for a visit in 2 week(s).   Specialty: Family Medicine Why:  for hospital follow-up Contact information: [address] [place] [address] [phone]         Satira Sark, MD .   Specialty: Cardiology Contact information: [place] [address] [phone]                Discharge Exam: Danley Danker Weights   06/11/22 2330 06/12/22 0405  Weight: 113.6 kg 112.5 kg   S: Ambulating in the room, no acute complaints.  Cleared by nephrology to discharge  Vitals:   06/12/22 0523 06/12/22 0548 06/12/22 0811 06/12/22 0845  BP: (!) 87/63 115/85 116/82   Pulse: (!) 118 (!) 118 (!) 109 (!) 105  Resp: '18 18 18 18  '$ Temp: 98.5 F (36.9 C) 98.7 F (37.1 C) 99.1 F (37.3 C)   TempSrc: Oral Oral Oral   SpO2: 95% 96% 96% 97%  Weight:      Height:        Physical Exam General: Alert and oriented x 3, NAD Cardiovascular: S1 S2 clear, RRR.  Respiratory: CTAB, no wheezing, rales or  rhonchi Gastrointestinal: Soft, nontender, nondistended, NBS Ext: no pedal edema bilaterally Neuro: no new deficits Psych: Normal affect and demeanor    Condition at discharge: fair  The results of significant diagnostics from this hospitalization (including imaging, microbiology, ancillary and laboratory) are listed below for reference.   Imaging Studies: IR Fluoro Guide CV Line Left  Result Date: 06/11/2022 INDICATION: 61 year old with end-stage renal disease. Nonfunctioning left chest tunneled dialysis catheter. EXAM: EXCHANGE OF TUNNELED DIALYSIS CATHETER WITH FLUOROSCOPY Physician: Stephan Minister. Anselm Pancoast, MD MEDICATIONS: Ancef 2 g; The antibiotic was administered within an appropriate time interval prior to skin puncture. ANESTHESIA/SEDATION: Moderate (conscious) sedation was employed during this procedure. A total of Versed 1.'5mg'$  and fentanyl 75 mcg was administered intravenously at the order of the provider performing the procedure. Total intra-service moderate sedation time: 21 minutes. Patient's level of consciousness and vital signs were monitored continuously by radiology nurse throughout the procedure under the supervision of the provider performing the procedure. FLUOROSCOPY: Radiation Exposure Index (as provided by the fluoroscopic device): 10 mGy Kerma COMPLICATIONS: None immediate. PROCEDURE: The procedure was explained to the patient. The risks and benefits of the procedure were discussed and the patient's questions were addressed. Informed consent was obtained from the patient. The left chest and existing catheter were prepped and draped in sterile fashion. Maximal barrier sterile technique was utilized including caps, mask, sterile gowns, sterile gloves, sterile drape, hand hygiene and skin antiseptic. Unable to aspirate from the blue port. Blood was aspirated from the red port. Skin around the catheter was anesthetized using 1% lidocaine. The catheter cuff was exposed with blunt dissection.  The old catheter was removed over a stiff Glidewire. 5 French catheter was used to advance the Glidewire into the IVC. New 23 cm tip to cuff Palindrome catheter was easily advanced over the wire and the tip was positioned near the superior cavoatrial junction. The cuff was placed underneath the skin. Both lumens aspirated and flushed very well. Appropriate amount of heparin was placed in both lumens. Catheter was secured to the skin with Prolene suture. Dressing was placed. FINDINGS: New catheter tip is positioned at the superior cavoatrial junction. IMPRESSION: Successful exchange of the left chest tunneled dialysis catheter with fluoroscopy. Electronically Signed   By: Markus Daft M.D.   On: 06/11/2022 17:19   DG Chest 2 View  Result Date: 06/09/2022 CLINICAL DATA:  Dialysis patient with malfunctioning catheter, unable to  be dialyzed. EXAM: CHEST - 2 VIEW COMPARISON:  Chest radiograph and CT chest 05/27/2022 FINDINGS: The left-sided dialysis catheter is stable with the tip terminating in the right atrium. The catheter is intact. The cardiomediastinal silhouette is stable. The known thick-walled cystic lesion in the left upper lobe is not well seen on the current study common better assessed on the CT chest from 05/27/2022. Aeration of the lungs is unchanged since the radiographs from 05/27/2022 with no new or worsening focal airspace disease. Linear opacities in the right midlung likely reflect scarring. There is no pulmonary edema. There is no pleural effusion or pneumothorax There is no acute osseous abnormality. IMPRESSION: 1. Stable left-sided dialysis catheter which appears intact. 2. Stable aeration of the lungs with no new or worsening focal airspace disease. The known thick-walled cavitary lesion in the left upper lobe is not well seen on the current study. Electronically Signed   By: Valetta Mole M.D.   On: 06/09/2022 16:16   CT CHEST ABDOMEN PELVIS WO CONTRAST  Result Date: 05/27/2022 CLINICAL  DATA:  Abdominal pain and left basilar atelectasis EXAM: CT CHEST, ABDOMEN AND PELVIS WITHOUT CONTRAST TECHNIQUE: Multidetector CT imaging of the chest, abdomen and pelvis was performed following the standard protocol without IV contrast. RADIATION DOSE REDUCTION: This exam was performed according to the departmental dose-optimization program which includes automated exposure control, adjustment of the mA and/or kV according to patient size and/or use of iterative reconstruction technique. COMPARISON:  04/19/2022, chest x-ray from earlier in the same day. FINDINGS: CT CHEST FINDINGS Cardiovascular: Limited due to lack of IV contrast. Mild atherosclerotic calcifications are noted. No cardiac enlargement is seen. Dialysis catheter is noted from the left jugular approach. Mediastinum/Nodes: Thoracic inlet is within normal limits. No hilar or mediastinal adenopathy is noted. The esophagus is within normal limits. Lungs/Pleura: Lungs demonstrate diffuse emphysematous changes bilaterally. Thick walled cavitary lesion is noted in the left upper lobe measuring approximately 6.5 cm in greatest dimension. This is roughly stable to that seen on the prior exam. Node parenchymal nodules are seen. No other focal infiltrate is noted. Musculoskeletal: No acute rib abnormality is noted. No compression deformities are seen. CT ABDOMEN PELVIS FINDINGS Hepatobiliary: No focal liver abnormality is seen. Status post cholecystectomy. No biliary dilatation. Pancreas: Pancreas is well visualized with a few fluid attenuation areas adjacent to the pancreas. One is near the junction of the head and body superiorly best seen on image number 54 of series 2. This measures approximately 5.3 cm. A second fluid attenuation lesion measuring 7.3 x 5.1 cm is noted along the body of the pancreas. These changes are consistent with pseudocyst formation. The degree of inflammatory change seen previously has improved significantly. No definitive acute on  chronic pancreatitis is noted at this time. Spleen: Normal in size without focal abnormality. Adrenals/Urinary Tract: Adrenal glands are within normal limits. Kidneys demonstrate no renal calculi or obstructive changes. No ureteral stones are seen. The bladder is partially distended. Stomach/Bowel: Considerable fecal material is noted throughout the colon consistent with a degree of constipation. No obstructive changes are seen. The appendix is within normal limits. Vascular/Lymphatic: Aortic atherosclerosis. No enlarged abdominal or pelvic lymph nodes. Reproductive: Prostate is unremarkable. Other: No abdominal wall hernia or abnormality. No abdominopelvic ascites. Musculoskeletal: Bilateral hip replacements are noted. Degenerative changes of lumbar spine are seen. IMPRESSION: CT of the chest: Stable appearing thick walled cavitary lesion in the left upper lobe. No new focal abnormality is noted. CT of the abdomen and pelvis: Two  fluid attenuation lesion adjacent to the pancreas consistent with pseudocyst formation. Significant improvement in the degree of inflammatory change of the pancreas is noted when compared with the prior study. Gallbladder has been surgically removed. No definitive biliary ductal stones are seen. Changes consistent with colonic constipation. Electronically Signed   By: Inez Catalina M.D.   On: 05/27/2022 23:57   DG Abd Acute W/Chest  Result Date: 05/27/2022 CLINICAL DATA:  Constipation and abdominal. EXAM: DG ABDOMEN ACUTE WITH 1 VIEW CHEST COMPARISON:  Chest radiograph 05/08/2022. Chest abdomen pelvis CT 04/19/2022. FINDINGS: Left internal jugular dialysis catheter unchanged in position. Improved lung volumes from prior exam. Cavitary process in the anterior left hemithorax is only faintly visualized by radiograph, grossly stable. Left basilar atelectasis. Heart is normal in size. No acute airspace disease. No free intra-abdominal air. No bowel dilatation to suggest obstruction. There  is a moderate volume of stool throughout the colon. Mild rectal distention with stool. Cholecystectomy clips in the right upper quadrant. Bilateral hip arthroplasties. IMPRESSION: 1. Moderate colonic stool burden with mild rectal distention with stool. No bowel obstruction or free air. 2. Improved lung volumes from prior exam with left basilar atelectasis. Cavitary process in the left lung is only faintly visualized by radiograph. Electronically Signed   By: Keith Rake M.D.   On: 05/27/2022 22:37   IR Fluoro Guide CV Line Left  Result Date: 05/13/2022 INDICATION: 61 year old male referred for exchange of nonfunctional left IJ tunneled hemodialysis catheter. EXAM: IMAGE GUIDED EXCHANGE OF TUNNELED HEMODIALYSIS CATHETER MEDICATIONS: None ANESTHESIA/SEDATION: None FLUOROSCOPY TIME:  Fluoroscopy Time: 0 minutes 18 seconds (22 mGy). COMPLICATIONS: None PROCEDURE: Informed written consent was obtained from the patient after a discussion of the risks, benefits, and alternatives to treatment. Questions regarding the procedure were encouraged and answered. The left neck and chest including the indwelling catheter were prepped with chlorhexidine in a sterile fashion, and a sterile drape was applied covering the operative field. Maximum barrier sterile technique with sterile gowns and gloves were used for the procedure. A timeout was performed prior to the initiation of the procedure. 1% lidocaine was used for local anesthesia. Aspiration was performed on the blue hub with clearance of the heparin dwell. A stiff Glidewire was advanced through the catheter into the IVC. Sutures were removed and then the catheter was removed on the wire. A new 23 cm tip to cuff catheter was placed on the stiff Glidewire. Tip was positioned in the upper right atrium. Wire was removed. The catheter aspirates and flushes normally. The catheter was flushed with appropriate volume heparin dwells. The catheter exit site was secured with a  0-Prolene retention suture. Dressings were applied. The patient tolerated the procedure well without immediate post procedural complication. IMPRESSION: Status post image guided exchange of left-sided IJ tunneled hemodialysis catheter. Signed, Dulcy Fanny. Nadene Rubins, RPVI [facility] Radiology Electronically Signed   By: Corrie Mckusick D.O.   On: 05/13/2022 14:41    Microbiology: Results for orders placed or performed during the hospital encounter of 05/27/22  Urine Culture     Status: None   Collection Time: 05/29/22  7:56 AM   Specimen: Urine, Clean Catch  Result Value Ref Range Status   Specimen Description   Final    URINE, CLEAN CATCH Performed at [facility], [address], [place], [place] [address]    Special Requests   Final    NONE Performed at [facility], [address], [place], [place] [address]  Culture   Final    NO GROWTH Performed at [facility], [place] [address] [place], [place] [address]    Report Status 05/31/2022 FINAL  Final    Labs: CBC: Recent Labs  Lab 06/09/22 1535 06/11/22 0343 06/12/22 0535  WBC 12.7* 9.6 12.0*  NEUTROABS 10.4*  --   --   HGB 8.4* 8.5* 9.0*  HCT 27.6* 28.0* 27.9*  MCV 91.4 89.2 88.6  PLT 343 318 903   Basic Metabolic Panel: Recent Labs  Lab 06/09/22 1535 06/10/22 1149 06/11/22 0343 06/12/22 0535  NA 139 137 139 136  K 4.1 4.2 3.6 3.1*  CL 99 100 100 95*  CO2 '26 23 23 27  '$ GLUCOSE 119* 89 111* 129*  BUN 27* 32* 36* 14  CREATININE 6.10* 5.91* 5.82* 2.68*  CALCIUM 9.8 9.7 9.4 9.1  MG  --   --  1.9 1.6*  PHOS  --  4.6 4.7*  --    Liver Function Tests: Recent Labs  Lab 06/10/22 1149 06/11/22 0343  ALBUMIN 3.2* 2.8*   CBG: Recent Labs  Lab 06/11/22 1539 06/11/22 1735 06/11/22 2110 06/12/22 0811 06/12/22 1140  GLUCAP 96 124* 120* 137* 109*    Discharge time spent: greater than 30 minutes.  Signed: Estill Cotta, MD [facility] 06/12/2022

## 2022-06-14 ENCOUNTER — Telehealth: Payer: Self-pay

## 2022-06-14 DIAGNOSIS — D689 Coagulation defect, unspecified: Secondary | ICD-10-CM | POA: Diagnosis not present

## 2022-06-14 DIAGNOSIS — E1122 Type 2 diabetes mellitus with diabetic chronic kidney disease: Secondary | ICD-10-CM | POA: Diagnosis not present

## 2022-06-14 DIAGNOSIS — Z992 Dependence on renal dialysis: Secondary | ICD-10-CM | POA: Diagnosis not present

## 2022-06-14 DIAGNOSIS — E039 Hypothyroidism, unspecified: Secondary | ICD-10-CM | POA: Diagnosis not present

## 2022-06-14 DIAGNOSIS — R52 Pain, unspecified: Secondary | ICD-10-CM | POA: Diagnosis not present

## 2022-06-14 DIAGNOSIS — N178 Other acute kidney failure: Secondary | ICD-10-CM | POA: Diagnosis not present

## 2022-06-14 DIAGNOSIS — D649 Anemia, unspecified: Secondary | ICD-10-CM | POA: Diagnosis not present

## 2022-06-14 NOTE — Telephone Encounter (Signed)
Returned the pt's call and advised him of his appt with Venetia Night on June 24, 2022@ 2 pm.

## 2022-06-14 NOTE — Progress Notes (Signed)
Late Entry Note: Pt was d/c to snf on Saturday. [facility] contacted this morning to advise clinic pt should resume care today.   Melven Sartorius Renal Navigator [phone]

## 2022-06-15 DIAGNOSIS — E785 Hyperlipidemia, unspecified: Secondary | ICD-10-CM | POA: Diagnosis not present

## 2022-06-15 DIAGNOSIS — E119 Type 2 diabetes mellitus without complications: Secondary | ICD-10-CM | POA: Diagnosis not present

## 2022-06-15 DIAGNOSIS — N186 End stage renal disease: Secondary | ICD-10-CM | POA: Diagnosis not present

## 2022-06-15 DIAGNOSIS — G4733 Obstructive sleep apnea (adult) (pediatric): Secondary | ICD-10-CM | POA: Diagnosis not present

## 2022-06-15 DIAGNOSIS — T8249XD Other complication of vascular dialysis catheter, subsequent encounter: Secondary | ICD-10-CM | POA: Diagnosis not present

## 2022-06-15 DIAGNOSIS — I959 Hypotension, unspecified: Secondary | ICD-10-CM | POA: Diagnosis not present

## 2022-06-15 DIAGNOSIS — J449 Chronic obstructive pulmonary disease, unspecified: Secondary | ICD-10-CM | POA: Diagnosis not present

## 2022-06-16 DIAGNOSIS — N178 Other acute kidney failure: Secondary | ICD-10-CM | POA: Diagnosis not present

## 2022-06-16 DIAGNOSIS — R52 Pain, unspecified: Secondary | ICD-10-CM | POA: Diagnosis not present

## 2022-06-16 DIAGNOSIS — D649 Anemia, unspecified: Secondary | ICD-10-CM | POA: Diagnosis not present

## 2022-06-16 DIAGNOSIS — Z992 Dependence on renal dialysis: Secondary | ICD-10-CM | POA: Diagnosis not present

## 2022-06-16 DIAGNOSIS — E039 Hypothyroidism, unspecified: Secondary | ICD-10-CM | POA: Diagnosis not present

## 2022-06-16 DIAGNOSIS — E1122 Type 2 diabetes mellitus with diabetic chronic kidney disease: Secondary | ICD-10-CM | POA: Diagnosis not present

## 2022-06-16 DIAGNOSIS — D689 Coagulation defect, unspecified: Secondary | ICD-10-CM | POA: Diagnosis not present

## 2022-06-17 DIAGNOSIS — M1991 Primary osteoarthritis, unspecified site: Secondary | ICD-10-CM | POA: Diagnosis not present

## 2022-06-17 DIAGNOSIS — I10 Essential (primary) hypertension: Secondary | ICD-10-CM | POA: Diagnosis not present

## 2022-06-17 DIAGNOSIS — Z992 Dependence on renal dialysis: Secondary | ICD-10-CM | POA: Diagnosis not present

## 2022-06-17 DIAGNOSIS — G4733 Obstructive sleep apnea (adult) (pediatric): Secondary | ICD-10-CM | POA: Diagnosis not present

## 2022-06-17 DIAGNOSIS — G894 Chronic pain syndrome: Secondary | ICD-10-CM | POA: Diagnosis not present

## 2022-06-17 DIAGNOSIS — J449 Chronic obstructive pulmonary disease, unspecified: Secondary | ICD-10-CM | POA: Diagnosis not present

## 2022-06-18 DIAGNOSIS — E039 Hypothyroidism, unspecified: Secondary | ICD-10-CM | POA: Diagnosis not present

## 2022-06-18 DIAGNOSIS — Z992 Dependence on renal dialysis: Secondary | ICD-10-CM | POA: Diagnosis not present

## 2022-06-18 DIAGNOSIS — R52 Pain, unspecified: Secondary | ICD-10-CM | POA: Diagnosis not present

## 2022-06-18 DIAGNOSIS — N179 Acute kidney failure, unspecified: Secondary | ICD-10-CM | POA: Diagnosis not present

## 2022-06-18 DIAGNOSIS — N178 Other acute kidney failure: Secondary | ICD-10-CM | POA: Diagnosis not present

## 2022-06-18 DIAGNOSIS — D689 Coagulation defect, unspecified: Secondary | ICD-10-CM | POA: Diagnosis not present

## 2022-06-18 DIAGNOSIS — D649 Anemia, unspecified: Secondary | ICD-10-CM | POA: Diagnosis not present

## 2022-06-18 DIAGNOSIS — E1122 Type 2 diabetes mellitus with diabetic chronic kidney disease: Secondary | ICD-10-CM | POA: Diagnosis not present

## 2022-06-19 ENCOUNTER — Other Ambulatory Visit: Payer: Self-pay

## 2022-06-19 ENCOUNTER — Emergency Department (HOSPITAL_COMMUNITY)
Admission: EM | Admit: 2022-06-19 | Discharge: 2022-06-19 | Disposition: A | Discharge status: Home / Self Care | Payer: Medicare Other | Attending: Emergency Medicine | Admitting: Emergency Medicine

## 2022-06-19 ENCOUNTER — Encounter (HOSPITAL_COMMUNITY): Payer: Self-pay | Admitting: Emergency Medicine

## 2022-06-19 DIAGNOSIS — Z96642 Presence of left artificial hip joint: Secondary | ICD-10-CM | POA: Diagnosis not present

## 2022-06-19 DIAGNOSIS — J449 Chronic obstructive pulmonary disease, unspecified: Secondary | ICD-10-CM | POA: Diagnosis not present

## 2022-06-19 DIAGNOSIS — E43 Unspecified severe protein-calorie malnutrition: Secondary | ICD-10-CM | POA: Diagnosis not present

## 2022-06-19 DIAGNOSIS — E1122 Type 2 diabetes mellitus with diabetic chronic kidney disease: Secondary | ICD-10-CM | POA: Insufficient documentation

## 2022-06-19 DIAGNOSIS — R42 Dizziness and giddiness: Secondary | ICD-10-CM | POA: Diagnosis present

## 2022-06-19 DIAGNOSIS — I132 Hypertensive heart and chronic kidney disease with heart failure and with stage 5 chronic kidney disease, or end stage renal disease: Secondary | ICD-10-CM | POA: Diagnosis not present

## 2022-06-19 DIAGNOSIS — I959 Hypotension, unspecified: Secondary | ICD-10-CM | POA: Insufficient documentation

## 2022-06-19 DIAGNOSIS — Z992 Dependence on renal dialysis: Secondary | ICD-10-CM | POA: Diagnosis not present

## 2022-06-19 DIAGNOSIS — G4733 Obstructive sleep apnea (adult) (pediatric): Secondary | ICD-10-CM | POA: Diagnosis not present

## 2022-06-19 DIAGNOSIS — Z7982 Long term (current) use of aspirin: Secondary | ICD-10-CM | POA: Diagnosis not present

## 2022-06-19 DIAGNOSIS — Z9181 History of falling: Secondary | ICD-10-CM | POA: Diagnosis not present

## 2022-06-19 DIAGNOSIS — Z9049 Acquired absence of other specified parts of digestive tract: Secondary | ICD-10-CM | POA: Diagnosis not present

## 2022-06-19 DIAGNOSIS — I5032 Chronic diastolic (congestive) heart failure: Secondary | ICD-10-CM | POA: Diagnosis not present

## 2022-06-19 DIAGNOSIS — Z794 Long term (current) use of insulin: Secondary | ICD-10-CM | POA: Diagnosis not present

## 2022-06-19 DIAGNOSIS — K5900 Constipation, unspecified: Secondary | ICD-10-CM | POA: Diagnosis not present

## 2022-06-19 DIAGNOSIS — D631 Anemia in chronic kidney disease: Secondary | ICD-10-CM | POA: Diagnosis not present

## 2022-06-19 DIAGNOSIS — E785 Hyperlipidemia, unspecified: Secondary | ICD-10-CM | POA: Diagnosis not present

## 2022-06-19 DIAGNOSIS — G894 Chronic pain syndrome: Secondary | ICD-10-CM | POA: Diagnosis not present

## 2022-06-19 DIAGNOSIS — N186 End stage renal disease: Secondary | ICD-10-CM | POA: Insufficient documentation

## 2022-06-19 DIAGNOSIS — K219 Gastro-esophageal reflux disease without esophagitis: Secondary | ICD-10-CM | POA: Diagnosis not present

## 2022-06-19 DIAGNOSIS — F17210 Nicotine dependence, cigarettes, uncomplicated: Secondary | ICD-10-CM | POA: Diagnosis not present

## 2022-06-19 LAB — BASIC METABOLIC PANEL
Anion gap: 10 (ref 5–15)
BUN: 15 mg/dL (ref 8–23)
CO2: 29 mmol/L (ref 22–32)
Calcium: 9 mg/dL (ref 8.9–10.3)
Chloride: 99 mmol/L (ref 98–111)
Creatinine, Ser: 3.37 mg/dL — ABNORMAL HIGH (ref 0.61–1.24)
GFR, Estimated: 20 mL/min — ABNORMAL LOW (ref >60)
Glucose, Bld: 112 mg/dL — ABNORMAL HIGH (ref 70–99)
Potassium: 3.5 mmol/L (ref 3.5–5.1)
Sodium: 138 mmol/L (ref 135–145)

## 2022-06-19 LAB — CBC
HCT: 24.3 % — ABNORMAL LOW (ref 39.0–52.0)
Hemoglobin: 7.4 g/dL — ABNORMAL LOW (ref 13.0–17.0)
MCH: 27.8 pg (ref 26.0–34.0)
MCHC: 30.5 g/dL (ref 30.0–36.0)
MCV: 91.4 fL (ref 80.0–100.0)
Platelets: 231 10*3/uL (ref 150–400)
RBC: 2.66 MIL/uL — ABNORMAL LOW (ref 4.22–5.81)
RDW: 17.1 % — ABNORMAL HIGH (ref 11.5–15.5)
WBC: 10.3 10*3/uL (ref 4.0–10.5)
nRBC: 0 % (ref 0.0–0.2)

## 2022-06-19 LAB — URINALYSIS, ROUTINE W REFLEX MICROSCOPIC
Bilirubin Urine: NEGATIVE
Glucose, UA: NEGATIVE mg/dL
Ketones, ur: NEGATIVE mg/dL
Nitrite: NEGATIVE
Protein, ur: >=300 mg/dL — AB
Specific Gravity, Urine: 1.016 (ref 1.005–1.030)
WBC, UA: >50 WBC/hpf — ABNORMAL HIGH (ref 0–5)
pH: 5 (ref 5.0–8.0)

## 2022-06-19 NOTE — ED Notes (Signed)
Per EDP request, Rn was able to have Pt ambulate around nurse station. Pt ambulated independently, w/o assistance and does not report any dizziness or lightheadedness. Pt reports he "feels better" than when he got here. EDP Notified. Pt also requesting EDP to prescribe him something to help him sleep.

## 2022-06-19 NOTE — ED Provider Notes (Signed)
[facility] EMERGENCY DEPARTMENT  Provider Note  CSN: [id number] Arrival date & time: 06/19/22 0217  History Chief Complaint  Patient presents with   Weakness   Hypotension    Mathew Lara is a 61 y.o. male with history of DM, ESRD on HD MWF reports Mathew Lara sometimes gets low BP after dialysis which Mathew Lara had yesterday (10/13). Mathew Lara reports Mathew Lara was feeling a little lightheaded as Mathew Lara was getting ready to go to bed tonight and checked his BP at home which was reportedly in the 80s. Mathew Lara reports Mathew Lara was previously on a medication to take for low BP (midodrine) but his kidney doctor told him his BP was high last time and not to take it anymore. Mathew Lara denies any fever, CP, SOB, N/V or dysuria. Mathew Lara is feeling better since arriving here.    Home Medications Prior to Admission medications   Medication Sig Start Date End Date Taking? Authorizing Provider  acetaminophen (TYLENOL) 325 MG tablet Take 2 tablets (650 mg total) by mouth every 6 (six) hours as needed for mild pain (or Fever >/= 101). 06/01/22   Emokpae, Courage, MD  albuterol (PROVENTIL) (2.5 MG/3ML) 0.083% nebulizer solution Take 3 mLs (2.5 mg total) by nebulization every 4 (four) hours as needed for wheezing or shortness of breath. 05/14/22   Elgergawy, Silver Huguenin, MD  arformoterol (BROVANA) 15 MCG/2ML NEBU Take 2 mLs (15 mcg total) by nebulization 2 (two) times daily. 05/14/22   Elgergawy, Silver Huguenin, MD  aspirin EC 81 MG tablet Take 1 tablet (81 mg total) by mouth daily with breakfast. 06/01/22   Roxan Hockey, MD  atorvastatin (LIPITOR) 80 MG tablet Take 1 tablet (80 mg total) by mouth daily. Patient taking differently: Take 80 mg by mouth at bedtime. 05/14/22   Elgergawy, Silver Huguenin, MD  B Complex-C-Zn-Folic Acid (DIALYVITE 220 WITH ZINC) 0.8 MG TABS Take 1 tablet by mouth daily.    [provider]  calcium carbonate (TUMS - DOSED IN MG ELEMENTAL CALCIUM) 500 MG chewable tablet Chew 1 tablet (200 mg of elemental calcium total) by mouth every 8  (eight) hours as needed for indigestion or heartburn. 05/14/22   Elgergawy, Silver Huguenin, MD  ferrous fumarate (FERRETTS) 325 (106 Fe) MG TABS tablet Take 1 tablet by mouth daily.    [provider]  insulin aspart (NOVOLOG) 100 UNIT/ML injection Inject 1-3 Units into the skin 3 (three) times daily with meals. Patient taking differently: Inject 2-12 Units into the skin See admin instructions. Inject per sliding scale: If 201-250=2 units 251-300=4 units 301-350=6 units 351-400=8 units 401-450=10 units 451-500=12 units If reads high, give 14 units, recheck in 2 hours. Call MD if blood sugar is >400 or <10 05/14/22   Elgergawy, Silver Huguenin, MD  insulin detemir (LEVEMIR) 100 UNIT/ML injection Inject 0.08 mLs (8 Units total) into the skin 2 (two) times daily. 06/01/22   Roxan Hockey, MD  loratadine (CLARITIN) 10 MG tablet Take 1 tablet (10 mg total) by mouth daily. 09/02/19   Barton Dubois, MD  midodrine (PROAMATINE) 10 MG tablet Take 1 tablet (10 mg total) by mouth 3 (three) times daily with meals. 06/01/22   Roxan Hockey, MD  Multiple Vitamin (MULTIVITAMIN) tablet Take 1 tablet by mouth daily.    [provider]  multivitamin (RENA-VIT) TABS tablet Take 1 tablet by mouth at bedtime. Patient not taking: Reported on 05/28/2022 05/14/22   Elgergawy, Silver Huguenin, MD  oxyCODONE (ROXICODONE) 5 MG immediate release tablet Take 1 tablet (5 mg total)  by mouth every 8 (eight) hours as needed for severe pain. 06/12/22   Rai, Ripudeep Raliegh Ip, MD  OXYGEN Inhale 2 L into the lungs continuous. 05/14/22   [provider]  pantoprazole (PROTONIX) 40 MG tablet Take 1 tablet (40 mg total) by mouth 2 (two) times daily before a meal. 30 minutes before breakfast 03/08/22 09/04/22  Annitta Needs, NP  polyethylene glycol (MIRALAX / GLYCOLAX) 17 g packet Take 17 g by mouth 2 (two) times daily. 06/01/22   Roxan Hockey, MD  QUEtiapine (SEROQUEL) 25 MG tablet Take 1 tablet (25 mg total) by mouth at bedtime. 05/14/22    Elgergawy, Silver Huguenin, MD  revefenacin (YUPELRI) 175 MCG/3ML nebulizer solution Take 3 mLs (175 mcg total) by nebulization daily. Patient taking differently: Inhale 175 mcg into the lungs daily. 05/15/22   Elgergawy, Silver Huguenin, MD  senna-docusate (SENOKOT-S) 8.6-50 MG tablet Take 2 tablets by mouth at bedtime. 06/01/22   Roxan Hockey, MD  sevelamer carbonate (RENVELA) 800 MG tablet Take 3 tablets (2,400 mg total) by mouth 3 (three) times daily with meals. 05/14/22   Elgergawy, Silver Huguenin, MD  simethicone (MYLICON) 80 MG chewable tablet Chew 1 tablet (80 mg total) by mouth 4 (four) times daily. Patient taking differently: Chew 80 mg by mouth every 4 (four) hours as needed for flatulence. 05/14/22   Elgergawy, Silver Huguenin, MD  sodium chloride (OCEAN) 0.65 % SOLN nasal spray Place 1 spray into both nostrils as needed for congestion. Patient taking differently: Place 1 spray into both nostrils every 2 (two) hours as needed for congestion. 05/14/22   Elgergawy, Silver Huguenin, MD  zolpidem (AMBIEN) 5 MG tablet Take 1 tablet (5 mg total) by mouth at bedtime as needed for sleep. 06/01/22   Roxan Hockey, MD     Allergies    Patient has no known allergies.   Review of Systems   Review of Systems Please see HPI for pertinent positives and negatives  Physical Exam BP 117/71   Pulse 95   Temp 98.3 F (36.8 C) (Oral)   Resp 16   Ht '5\' 8"'$  (1.727 m)   Wt 110.7 kg   SpO2 94%   BMI 37.10 kg/m   Physical Exam Vitals and nursing note reviewed.  Constitutional:      Appearance: Normal appearance.  HENT:     Head: Normocephalic and atraumatic.     Nose: Nose normal.     Mouth/Throat:     Mouth: Mucous membranes are moist.  Eyes:     Extraocular Movements: Extraocular movements intact.     Conjunctiva/sclera: Conjunctivae normal.  Cardiovascular:     Rate and Rhythm: Normal rate.  Pulmonary:     Effort: Pulmonary effort is normal.     Breath sounds: Normal breath sounds.     Comments: Dialysis catheter  in left chest Abdominal:     General: Abdomen is flat.     Palpations: Abdomen is soft.     Tenderness: There is no abdominal tenderness.  Musculoskeletal:        General: No swelling. Normal range of motion.     Cervical back: Neck supple.  Skin:    General: Skin is warm and dry.  Neurological:     General: No focal deficit present.     Mental Status: Mathew Lara is alert.  Psychiatric:        Mood and Affect: Mood normal.     ED Results / Procedures / Treatments   EKG EKG Interpretation  Date/Time:  Saturday June 19 2022 02:33:13 EDT Ventricular Rate:  98 PR Interval:  185 QRS Duration: 81 QT Interval:  419 QTC Calculation: 535 R Axis:   43 Text Interpretation: Sinus rhythm Borderline T wave abnormalities Prolonged QT interval Since last tracing QT has lengthened Confirmed by Calvert Cantor [phone]) on 06/19/2022 3:13:29 AM  Procedures Procedures  Medications Ordered in the ED Medications - No data to display  Initial Impression and Plan  Patient here with transient hypotension after having dialysis earlier in the day. His BP has not been particularly low here, no signs of sepsis or hypovolemia. His feeling better, able to ambulate without difficulty and comfortable going home. Labs done here show CBC with anemia, but not in need of emergent transfusion, BMP with CKD, both to be addressed by Nephro. Mathew Lara was also encouraged to discuss his midodrine with his nephrologist as there seems to be some confusion about when Mathew Lara is supposed to take it.   ED Course       MDM Rules/Calculators/A&P Medical Decision Making Given presenting complaint, I considered that admission might be necessary. After review of results from ED lab and/or imaging studies, admission to the hospital is not indicated at this time.    Problems Addressed: Transient hypotension: chronic illness or injury with exacerbation, progression, or side effects of treatment  Amount and/or Complexity of Data  Reviewed Labs: ordered. Decision-making details documented in ED Course.  Risk Decision regarding hospitalization.    Final Clinical Impression(s) / ED Diagnoses Final diagnoses:  Transient hypotension    Rx / DC Orders ED Discharge Orders     None        Truddie Hidden, MD 06/19/22 0451

## 2022-06-19 NOTE — ED Triage Notes (Signed)
Pt here for c/o hypotension (states Bp was in "80's") and weakness. States he had dialysis yesterday.

## 2022-06-19 NOTE — Discharge Instructions (Signed)
Please discuss your medications, in particular the midodrine you take for low blood pressure, with your kidney doctors to make sure you are taking the right thing.

## 2022-06-21 DIAGNOSIS — R52 Pain, unspecified: Secondary | ICD-10-CM | POA: Diagnosis not present

## 2022-06-21 DIAGNOSIS — N178 Other acute kidney failure: Secondary | ICD-10-CM | POA: Diagnosis not present

## 2022-06-21 DIAGNOSIS — D689 Coagulation defect, unspecified: Secondary | ICD-10-CM | POA: Diagnosis not present

## 2022-06-21 DIAGNOSIS — E1122 Type 2 diabetes mellitus with diabetic chronic kidney disease: Secondary | ICD-10-CM | POA: Diagnosis not present

## 2022-06-21 DIAGNOSIS — D649 Anemia, unspecified: Secondary | ICD-10-CM | POA: Diagnosis not present

## 2022-06-21 DIAGNOSIS — Z992 Dependence on renal dialysis: Secondary | ICD-10-CM | POA: Diagnosis not present

## 2022-06-21 DIAGNOSIS — E039 Hypothyroidism, unspecified: Secondary | ICD-10-CM | POA: Diagnosis not present

## 2022-06-22 DIAGNOSIS — I5032 Chronic diastolic (congestive) heart failure: Secondary | ICD-10-CM | POA: Diagnosis not present

## 2022-06-22 DIAGNOSIS — G894 Chronic pain syndrome: Secondary | ICD-10-CM | POA: Diagnosis not present

## 2022-06-22 DIAGNOSIS — Z992 Dependence on renal dialysis: Secondary | ICD-10-CM | POA: Diagnosis not present

## 2022-06-22 DIAGNOSIS — E1122 Type 2 diabetes mellitus with diabetic chronic kidney disease: Secondary | ICD-10-CM | POA: Diagnosis not present

## 2022-06-22 DIAGNOSIS — J449 Chronic obstructive pulmonary disease, unspecified: Secondary | ICD-10-CM | POA: Diagnosis not present

## 2022-06-22 DIAGNOSIS — Z7982 Long term (current) use of aspirin: Secondary | ICD-10-CM | POA: Diagnosis not present

## 2022-06-22 DIAGNOSIS — Z96642 Presence of left artificial hip joint: Secondary | ICD-10-CM | POA: Diagnosis not present

## 2022-06-22 DIAGNOSIS — F17210 Nicotine dependence, cigarettes, uncomplicated: Secondary | ICD-10-CM | POA: Diagnosis not present

## 2022-06-22 DIAGNOSIS — E785 Hyperlipidemia, unspecified: Secondary | ICD-10-CM | POA: Diagnosis not present

## 2022-06-22 DIAGNOSIS — K5900 Constipation, unspecified: Secondary | ICD-10-CM | POA: Diagnosis not present

## 2022-06-22 DIAGNOSIS — Z9181 History of falling: Secondary | ICD-10-CM | POA: Diagnosis not present

## 2022-06-22 DIAGNOSIS — D631 Anemia in chronic kidney disease: Secondary | ICD-10-CM | POA: Diagnosis not present

## 2022-06-22 DIAGNOSIS — N186 End stage renal disease: Secondary | ICD-10-CM | POA: Diagnosis not present

## 2022-06-22 DIAGNOSIS — I959 Hypotension, unspecified: Secondary | ICD-10-CM | POA: Diagnosis not present

## 2022-06-22 DIAGNOSIS — G4733 Obstructive sleep apnea (adult) (pediatric): Secondary | ICD-10-CM | POA: Diagnosis not present

## 2022-06-22 DIAGNOSIS — I132 Hypertensive heart and chronic kidney disease with heart failure and with stage 5 chronic kidney disease, or end stage renal disease: Secondary | ICD-10-CM | POA: Diagnosis not present

## 2022-06-22 DIAGNOSIS — K219 Gastro-esophageal reflux disease without esophagitis: Secondary | ICD-10-CM | POA: Diagnosis not present

## 2022-06-22 DIAGNOSIS — Z794 Long term (current) use of insulin: Secondary | ICD-10-CM | POA: Diagnosis not present

## 2022-06-22 DIAGNOSIS — Z9049 Acquired absence of other specified parts of digestive tract: Secondary | ICD-10-CM | POA: Diagnosis not present

## 2022-06-22 DIAGNOSIS — E43 Unspecified severe protein-calorie malnutrition: Secondary | ICD-10-CM | POA: Diagnosis not present

## 2022-06-23 ENCOUNTER — Encounter: Payer: Self-pay | Admitting: Vascular Surgery

## 2022-06-23 ENCOUNTER — Telehealth: Payer: Self-pay

## 2022-06-23 ENCOUNTER — Ambulatory Visit: Payer: Medicare Other | Admitting: Vascular Surgery

## 2022-06-23 VITALS — BP 132/84 | HR 108 | Temp 98.4°F | Ht 68.0 in | Wt 246.6 lb

## 2022-06-23 DIAGNOSIS — E039 Hypothyroidism, unspecified: Secondary | ICD-10-CM | POA: Diagnosis not present

## 2022-06-23 DIAGNOSIS — D649 Anemia, unspecified: Secondary | ICD-10-CM | POA: Diagnosis not present

## 2022-06-23 DIAGNOSIS — Z992 Dependence on renal dialysis: Secondary | ICD-10-CM | POA: Diagnosis not present

## 2022-06-23 DIAGNOSIS — D689 Coagulation defect, unspecified: Secondary | ICD-10-CM | POA: Diagnosis not present

## 2022-06-23 DIAGNOSIS — R52 Pain, unspecified: Secondary | ICD-10-CM | POA: Diagnosis not present

## 2022-06-23 DIAGNOSIS — E1122 Type 2 diabetes mellitus with diabetic chronic kidney disease: Secondary | ICD-10-CM | POA: Diagnosis not present

## 2022-06-23 DIAGNOSIS — N178 Other acute kidney failure: Secondary | ICD-10-CM | POA: Diagnosis not present

## 2022-06-23 DIAGNOSIS — N179 Acute kidney failure, unspecified: Secondary | ICD-10-CM | POA: Diagnosis not present

## 2022-06-23 DIAGNOSIS — N186 End stage renal disease: Secondary | ICD-10-CM | POA: Diagnosis not present

## 2022-06-23 NOTE — Progress Notes (Signed)
[facility] [place]  Patient name: Mathew Lara MRN: [id number] DOB: 08/20/1961 Sex: male  REASON FOR CONSULT: Discuss long-term access for hemodialysis  HPI: Mathew Lara is a 61 y.o. male, who is here today for discussion of access for hemodialysis.  He is here today with his sister.  He has developed acute renal failure.  He initially had a right IJ catheter placed which became nonfunctional.  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  He has dialysis at [facility] Monday Wednesday and Friday.  He is right-handed.  He does not have a pacemaker.  He is not on any anticoagulant.  Past Medical History:  Diagnosis Date   Alcohol abuse    Anxiety    Bronchitis    Chronic hip pain    CKD (chronic kidney disease) stage 3, GFR 30-59 ml/min (HCC)    COPD (chronic obstructive pulmonary disease) (HCC)    DKA (diabetic ketoacidosis) ([place]) 04/10/2022   Essential hypertension    Hepatitis C    Mixed hyperlipidemia    Sleep apnea     Family History  Problem Relation Age of Onset   Cancer Mother    Colon cancer Neg Hx    Colon polyps Neg Hx     SOCIAL HISTORY: Social History   Socioeconomic History   Marital status: Single    Spouse name: Not on file   Number of children: 1   Years of education: 10   Highest education level: Not on file  Occupational History    Comment: [employer]  Tobacco Use   Smoking status: Former    Packs/day: 1.00    Years: 42.00    Total pack years: 42.00    Types: Cigarettes    Start date: 09/07/1971    Quit date: 05/28/2014    Years since quitting: 8.0    Passive exposure: Past   Smokeless tobacco: Never   Tobacco comments:    quit in November 2015 after smoking x 20 yrs.  Vaping Use   Vaping Use: Never used  Substance and Sexual Activity   Alcohol use: Not  Currently    Alcohol/week: 0.0 standard drinks of alcohol   Drug use: No   Sexual activity: Not on file  Other Topics Concern   Not on file  Social History Narrative       patient consumes 4 cups of caffeine daily.      Patient stopped drinking years ago.   Social Determinants of Health   Financial Resource Strain: Not on file  Food Insecurity: No Food Insecurity (05/28/2022)   Hunger Vital Sign    Worried About Running Out of Food in the Last Year: Never true    Ran Out of Food in the Last Year: Never true  Transportation Needs: No Transportation Needs (05/28/2022)   PRAPARE - Hydrologist (Medical): No    Lack of Transportation (Non-Medical): No  Physical Activity: Not on file  Stress: Not on file  Social Connections: Not on file  Intimate Partner Violence: Unknown (05/28/2022)   Humiliation, Afraid, Rape, and Kick questionnaire    Fear of Current or Ex-Partner: No    Emotionally Abused: No    Physically Abused: No    Sexually Abused: Not on file    No Known Allergies  Current Outpatient Medications  Medication Sig Dispense  Refill   acetaminophen (TYLENOL) 325 MG tablet Take 2 tablets (650 mg total) by mouth every 6 (six) hours as needed for mild pain (or Fever >/= 101). 120 tablet 0   albuterol (PROVENTIL) (2.5 MG/3ML) 0.083% nebulizer solution Take 3 mLs (2.5 mg total) by nebulization every 4 (four) hours as needed for wheezing or shortness of breath. 75 mL 12   arformoterol (BROVANA) 15 MCG/2ML NEBU Take 2 mLs (15 mcg total) by nebulization 2 (two) times daily. 120 mL    aspirin EC 81 MG tablet Take 1 tablet (81 mg total) by mouth daily with breakfast. 30 tablet 11   atorvastatin (LIPITOR) 80 MG tablet Take 1 tablet (80 mg total) by mouth daily. (Patient taking differently: Take 80 mg by mouth at bedtime.)     B Complex-C-Zn-Folic Acid (DIALYVITE 678 WITH ZINC) 0.8 MG TABS Take 1 tablet by mouth daily.     calcium carbonate (TUMS - DOSED IN MG  ELEMENTAL CALCIUM) 500 MG chewable tablet Chew 1 tablet (200 mg of elemental calcium total) by mouth every 8 (eight) hours as needed for indigestion or heartburn.     ferrous fumarate (FERRETTS) 325 (106 Fe) MG TABS tablet Take 1 tablet by mouth daily.     insulin aspart (NOVOLOG) 100 UNIT/ML injection Inject 1-3 Units into the skin 3 (three) times daily with meals. (Patient taking differently: Inject 2-12 Units into the skin See admin instructions. Inject per sliding scale: If 201-250=2 units 251-300=4 units 301-350=6 units 351-400=8 units 401-450=10 units 451-500=12 units If reads high, give 14 units, recheck in 2 hours. Call MD if blood sugar is >400 or <10) 10 mL 11   insulin detemir (LEVEMIR) 100 UNIT/ML injection Inject 0.08 mLs (8 Units total) into the skin 2 (two) times daily. 10 mL 11   loratadine (CLARITIN) 10 MG tablet Take 1 tablet (10 mg total) by mouth daily. 30 tablet 1   midodrine (PROAMATINE) 10 MG tablet Take 1 tablet (10 mg total) by mouth 3 (three) times daily with meals. 90 tablet 3   Multiple Vitamin (MULTIVITAMIN) tablet Take 1 tablet by mouth daily.     oxyCODONE (ROXICODONE) 5 MG immediate release tablet Take 1 tablet (5 mg total) by mouth every 8 (eight) hours as needed for severe pain. 20 tablet 0   OXYGEN Inhale 2 L into the lungs continuous.     pantoprazole (PROTONIX) 40 MG tablet Take 1 tablet (40 mg total) by mouth 2 (two) times daily before a meal. 30 minutes before breakfast 60 tablet 5   polyethylene glycol (MIRALAX / GLYCOLAX) 17 g packet Take 17 g by mouth 2 (two) times daily. 60 each 0   QUEtiapine (SEROQUEL) 25 MG tablet Take 1 tablet (25 mg total) by mouth at bedtime.     revefenacin (YUPELRI) 175 MCG/3ML nebulizer solution Take 3 mLs (175 mcg total) by nebulization daily. (Patient taking differently: Inhale 175 mcg into the lungs daily.) 90 mL    senna-docusate (SENOKOT-S) 8.6-50 MG tablet Take 2 tablets by mouth at bedtime. 60 tablet 3   sevelamer  carbonate (RENVELA) 800 MG tablet Take 3 tablets (2,400 mg total) by mouth 3 (three) times daily with meals.     simethicone (MYLICON) 80 MG chewable tablet Chew 1 tablet (80 mg total) by mouth 4 (four) times daily. (Patient taking differently: Chew 80 mg by mouth every 4 (four) hours as needed for flatulence.) 30 tablet 0   sodium chloride (OCEAN) 0.65 % SOLN nasal spray Place 1 spray  into both nostrils as needed for congestion. (Patient taking differently: Place 1 spray into both nostrils every 2 (two) hours as needed for congestion.)  0   zolpidem (AMBIEN) 5 MG tablet Take 1 tablet (5 mg total) by mouth at bedtime as needed for sleep. 10 tablet 0   multivitamin (RENA-VIT) TABS tablet Take 1 tablet by mouth at bedtime. (Patient not taking: Reported on 05/28/2022)  0   No current facility-administered medications for this visit.    REVIEW OF SYSTEMS:  '[X]'$  denotes positive finding, '[ ]'$  denotes negative finding Cardiac  Comments:  Chest pain or chest pressure:    Shortness of breath upon exertion:    Short of breath when lying flat:    Irregular heart rhythm:        Vascular    Pain in calf, thigh, or hip brought on by ambulation:    Pain in feet at night that wakes you up from your sleep:     Blood clot in your veins:    Leg swelling:         Pulmonary    Oxygen at home:    Productive cough:     Wheezing:         Neurologic    Sudden weakness in arms or legs:     Sudden numbness in arms or legs:     Sudden onset of difficulty speaking or slurred speech:    Temporary loss of vision in one eye:     Problems with dizziness:         Gastrointestinal    Blood in stool:     Vomited blood:         Genitourinary    Burning when urinating:     Blood in urine:        Psychiatric    Major depression:         Hematologic    Bleeding problems:    Problems with blood clotting too easily:        Skin    Rashes or ulcers:        Constitutional    Fever or chills:      PHYSICAL  EXAM: Vitals:   06/23/22 0911  BP: 132/84  Pulse: (!) 108  Temp: 98.4 F (36.9 C)  SpO2: 94%  Weight: 246 lb 9.6 oz (111.9 kg)  Height: '5\' 8"'$  (1.727 m)    GENERAL: The patient is a well-nourished male, in no acute distress. The vital signs are documented above. CARDIOVASCULAR: 2+ radial pulses bilaterally.  Moderate surface veins bilaterally. PULMONARY: There is good air exchange  MUSCULOSKELETAL: There are no major deformities or cyanosis. NEUROLOGIC: No focal weakness or paresthesias are detected. SKIN: There are no ulcers or rashes noted. PSYCHIATRIC: The patient has a normal affect.  DATA:  I imaged his left arm cephalic vein.  This is quite large at the wrist but then has multiple branches just above the wrist.  The antecubital vein is quite large and he has a very nicely developed cephalic vein throughout his upper arm.  MEDICAL ISSUES: I had a long discussion with the patient and his sister regarding access for hemodialysis.  I discussed the long-term use of catheter and the risk of infection.  He has already had difficulty with thrombosis and replacement of a left jugular vein.  He does appear to be an excellent candidate for left arm fistula creation.  In all likelihood this would be upper arm due to the multiple branches in  his left forearm.  We would make this decision at the time of surgery.  He reports that he is having renal function testing and hopes that he may be able to stop dialysis.  I explained our role would be providing access if he needs ongoing dialysis.  I also explained that even if he is able to stop dialysis, with severe renal insufficiency his nephrologist may recommend placement to have access available should he need to resume hemodialysis.  We have tentatively scheduled him for left arm AV fistula creation on 07/06/2022 at [facility].  We will await confirmation from Dr. Marval Regal before proceeding   Rosetta Posner, MD [facility] [phone] Pager [phone]  Note: Portions of this report may have been transcribed using voice recognition software.  Every effort has been made to ensure accuracy; however, inadvertent computerized transcription errors may still be present.

## 2022-06-23 NOTE — Telephone Encounter (Signed)
I called patient to tentatively schedule his left arm AV fistula vs graft on 07/06/22. He was at dialysis and ended up putting Mariann Barter, PA on the phone who advised to hold off on scheduling right now. States his catheter is functioning and creatinine trending down. Patient is scheduled to follow up next week in office, so will discuss further with nephrologist to determine if access should be placed if needed in the future. Dr. Donnetta Hutching made aware.

## 2022-06-24 ENCOUNTER — Ambulatory Visit (INDEPENDENT_AMBULATORY_CARE_PROVIDER_SITE_OTHER): Payer: Medicare Other | Admitting: Gastroenterology

## 2022-06-24 ENCOUNTER — Encounter: Payer: Self-pay | Admitting: Gastroenterology

## 2022-06-24 VITALS — BP 145/88 | HR 106 | Temp 97.7°F | Ht 68.0 in | Wt 248.2 lb

## 2022-06-24 DIAGNOSIS — K219 Gastro-esophageal reflux disease without esophagitis: Secondary | ICD-10-CM | POA: Diagnosis not present

## 2022-06-24 DIAGNOSIS — K863 Pseudocyst of pancreas: Secondary | ICD-10-CM | POA: Diagnosis not present

## 2022-06-24 DIAGNOSIS — K5900 Constipation, unspecified: Secondary | ICD-10-CM

## 2022-06-24 DIAGNOSIS — K760 Fatty (change of) liver, not elsewhere classified: Secondary | ICD-10-CM

## 2022-06-24 DIAGNOSIS — Z8719 Personal history of other diseases of the digestive system: Secondary | ICD-10-CM | POA: Diagnosis not present

## 2022-06-24 NOTE — Patient Instructions (Addendum)
We are placing on recall to have your CT of your pancreas performed in 4-8 weeks.  We will call with results once your imaging has been completed and received.  Continue taking your pantoprazole 40 mg daily, 30 minutes prior to breakfast.  Continue over-the-counter stool softener/laxative as needed and follow a high-fiber diet.  Continue to avoid all NSAIDs (ibuprofen, Advil, Aleve, BC or Goody powders, aspirin), tobacco, and alcohol.  Continue to follow with nephrology (kidney doctor), your appointment is on October 31 at 11:20 AM ([address] in [place]).   Follow-up in the clinic in 4 months or sooner if needed.  It was a pleasure to see you today. I want to create trusting relationships with patients. If you receive a survey regarding your visit,  I greatly appreciate you taking time to fill this out on paper or through your MyChart. I value your feedback.  Venetia Night, MSN, FNP-BC, AGACNP-BC [facility]

## 2022-06-24 NOTE — Progress Notes (Signed)
GI Office Note    Referring Provider: [name], MD Primary Care Physician:  [name], MD Primary Gastroenterologist: [place] Alas. Abbey Chatters, DO  Date:  06/24/2022  ID:  Mathew Lara, DOB 02-Feb-1961, MRN [id number]   Chief Complaint   Chief Complaint  Patient presents with   Follow-up    History of Present Illness  Mathew Lara is a 61 y.o. male with a history of hepatitis C, genotype 1b treated with [name] 2016 with SVR, anxiety, COPD, CKD stage III, HTN, HLD, end-stage renal disease on dialysis Monday Wednesday Friday, remote history of alcohol and polysubstance abuse, and diabetes presenting today for hospital follow up.  Prior CT in May 2022 with coarse liver incidentally found.  Abdominal ultrasound with fatty liver, negative for mass.  Though suspected gallbladder polyp/mass in the fundus and was referred to surgery.  Laparoscopic cholecystectomy and liver biopsy in July 2022 with pathology consistent with chronic cholecystitis and cholesterolosis.  Liver biopsy with chronic hepatitis minimal activity grade 1 bridging fibrosis stage III consistent with history of hepatitis C.  Yearly ultrasounds recommended due to advanced fibrosis and risk of progression to cirrhosis.  Last seen in the office 02/17/2022.  Was due for yearly ultrasound.  No prior TCS and EGD performed in October 2022.  TCS with adenomas.  EGD with dilation due to dysphagia.  He reports complaints of gas/bloating after cholecystectomy.  His GERD was controlled on pantoprazole twice daily.  No issues with dysphagia, mental status changes, or confusion.  He was advised to decrease his PPI to daily, obtain yearly ultrasound and GERD diet discussed.  Abdominal ultrasound 04/21/2022 with CBD measuring 7 mm, evidence of cholecystectomy.  No intrahepatic biliary ductal dilation.  Probable fatty infiltration of the liver unchanged.  He was seen inpatient at [facility] 05/28/2022 for abdominal pain, nausea,  vomiting.  He had recently been discharged from [facility] 9/8.  He had multiple CT scans revealing acute pancreatitis.  His initial CT scan did not show any pseudocyst.  He had right upper quadrant ultrasound with dilated CBD and increased liver echogenicity with normal portal vein on Doppler.  Repeat ultrasound 10 days later with again dilated CBD but this time decreased from 10 mm to 7 mm.  Follow-up CT A/P 04/19/2022 with increased fluid around the pancreas without evidence of pancreatic necrosis.  GI was consulted during this most recent admission due to concern for pseudocyst.  On his CT scan of abdomen pelvis 05/27/22 he had a fluid collection at the head and body of the pancreas consistent with pseudocyst formation with improvement in his pancreatitis.  No definitive acute on chronic pancreatitis.  Possible changes consistent with chronic constipation.  He was treated with supportive measures and given aggressive bowel regimen.  He was advised to have follow-up CT with pancreatic protocol in 8-12 weeks.  And to follow-up posthospitalization.  Admitted to [facility] 06/09/2022 through 06/12/2022.  Admitted due to clotted dialysis catheter he underwent exchange of his dialysis catheter on 10/6 and was advised to resume outpatient dialysis.  He is currently pending left arm AV fistula versus graft on 07/06/2022.  He had a recent hypotensive episode with ED visit 06/19/2022.   Today: Doing well since hospitalization. When in rehab his pain improved. Has been getting around good. When he was taking pain medications he was constipated and after enemas he had good relief. Currently he is taking stool softeners and reports regular soft bowel movements and not needing to straining. No melena  or BRBPR, lack of appetite. Reports he has lost weight (usually at dialysis 245 lbs). Weight 305 before he got sick. Reports he is coming off of dialysis and has an appointment with nephrology scheduled 10/31. Denies any  diarrhea. Has been avoiding fried foods, eating protein and good amount of vegetables daily. Blood sugars have been great. Has been having some lower BP related to dialysis. Denies swelling in extremities, chest pain, shortness of breath. Denies any alcohol or tobacco use. Still on iron supplements. No longer taking miralax. Only taking senna daily.   Reflux is controlled on pantoprazole once daily.  Has been walking every morning for 10 minutes at a time.    Current Outpatient Medications  Medication Sig Dispense Refill   acetaminophen (TYLENOL) 325 MG tablet Take 2 tablets (650 mg total) by mouth every 6 (six) hours as needed for mild pain (or Fever >/= 101). 120 tablet 0   aspirin EC 81 MG tablet Take 1 tablet (81 mg total) by mouth daily with breakfast. 30 tablet 11   atorvastatin (LIPITOR) 80 MG tablet Take 1 tablet (80 mg total) by mouth daily. (Patient taking differently: Take 80 mg by mouth at bedtime.)     B Complex-C-Zn-Folic Acid (DIALYVITE 045 WITH ZINC) 0.8 MG TABS Take 1 tablet by mouth daily.     ferrous fumarate (FERRETTS) 325 (106 Fe) MG TABS tablet Take 1 tablet by mouth daily.     insulin aspart (NOVOLOG) 100 UNIT/ML injection Inject 1-3 Units into the skin 3 (three) times daily with meals. (Patient taking differently: Inject 2-12 Units into the skin See admin instructions. Inject per sliding scale: If 201-250=2 units 251-300=4 units 301-350=6 units 351-400=8 units 401-450=10 units 451-500=12 units If reads high, give 14 units, recheck in 2 hours. Call MD if blood sugar is >400 or <10) 10 mL 11   insulin detemir (LEVEMIR) 100 UNIT/ML injection Inject 0.08 mLs (8 Units total) into the skin 2 (two) times daily. 10 mL 11   loratadine (CLARITIN) 10 MG tablet Take 1 tablet (10 mg total) by mouth daily. 30 tablet 1   Multiple Vitamin (MULTIVITAMIN) tablet Take 1 tablet by mouth daily.     oxyCODONE (ROXICODONE) 5 MG immediate release tablet Take 1 tablet (5 mg total) by mouth  every 8 (eight) hours as needed for severe pain. 20 tablet 0   pantoprazole (PROTONIX) 40 MG tablet Take 1 tablet (40 mg total) by mouth 2 (two) times daily before a meal. 30 minutes before breakfast (Patient taking differently: Take 40 mg by mouth daily. 30 minutes before breakfast) 60 tablet 5   QUEtiapine (SEROQUEL) 25 MG tablet Take 1 tablet (25 mg total) by mouth at bedtime.     revefenacin (YUPELRI) 175 MCG/3ML nebulizer solution Take 3 mLs (175 mcg total) by nebulization daily. (Patient taking differently: Inhale 175 mcg into the lungs daily.) 90 mL    senna-docusate (SENOKOT-S) 8.6-50 MG tablet Take 2 tablets by mouth at bedtime. 60 tablet 3   sevelamer carbonate (RENVELA) 800 MG tablet Take 3 tablets (2,400 mg total) by mouth 3 (three) times daily with meals.     simethicone (MYLICON) 80 MG chewable tablet Chew 1 tablet (80 mg total) by mouth 4 (four) times daily. (Patient taking differently: Chew 80 mg by mouth every 4 (four) hours as needed for flatulence.) 30 tablet 0   zolpidem (AMBIEN) 5 MG tablet Take 1 tablet (5 mg total) by mouth at bedtime as needed for sleep. 10 tablet 0  albuterol (PROVENTIL) (2.5 MG/3ML) 0.083% nebulizer solution Take 3 mLs (2.5 mg total) by nebulization every 4 (four) hours as needed for wheezing or shortness of breath. (Patient not taking: Reported on 06/24/2022) 75 mL 12   midodrine (PROAMATINE) 10 MG tablet Take 1 tablet (10 mg total) by mouth 3 (three) times daily with meals. 90 tablet 3   sodium chloride (OCEAN) 0.65 % SOLN nasal spray Place 1 spray into both nostrils as needed for congestion. (Patient not taking: Reported on 06/24/2022)  0   No current facility-administered medications for this visit.    Past Medical History:  Diagnosis Date   Alcohol abuse    Anxiety    Bronchitis    Chronic hip pain    CKD (chronic kidney disease) stage 3, GFR 30-59 ml/min (HCC)    COPD (chronic obstructive pulmonary disease) ([place])    DKA (diabetic ketoacidosis)  ([place]) 04/10/2022   Essential hypertension    Hepatitis C    Mixed hyperlipidemia    Sleep apnea     Past Surgical History:  Procedure Laterality Date   BALLOON DILATION N/A 07/06/2021   Procedure: BALLOON DILATION;  Surgeon: [name], DO;  [facility];  Service: Endoscopy;  Laterality: N/A;   BIOPSY  07/06/2021   Procedure: BIOPSY;  Surgeon: [name], DO;  [facility]: [facility];  Service: Endoscopy;;   CATARACT EXTRACTION Right    CHOLECYSTECTOMY N/A 03/23/2021   Procedure: LAPAROSCOPIC CHOLECYSTECTOMY;  Surgeon: [name], MD;  Location: [facility];  Service: General;  Laterality: N/A;   COLONOSCOPY N/A 01/23/2014   SLF:The LEFT COLON IS EXTREMELY redundant/TWO RECTAL POLYPS REMOVED/SMALL VOLUME RECTAL BLEEDING MOST LIKELY DUE TO Small internal hemorrhoids. path with prolapse type polyp of benign-mucosa. no adenomas   COLONOSCOPY WITH PROPOFOL N/A 07/06/2021   non-bleeding internal hemorrhoids, one 8 mm polyp in sigmod s/p removal and clip placed. One 5 mm polyp at recto-sigmoid s/p removal. 3 year surveillance. Tubular adenomas   ESOPHAGOGASTRODUODENOSCOPY N/A 01/23/2014   SAY:[id number] ring at the gastroesophageal junction/Medium sized hiatal hernia/NDYSPEPSIA MOST LIKELY DUE TO GERD/MILD Non-erosive gastritis   ESOPHAGOGASTRODUODENOSCOPY N/A 04/18/2018   food impaction, small hiatal hernia   ESOPHAGOGASTRODUODENOSCOPY (EGD) WITH PROPOFOL N/A 07/06/2021   moderate Schatzki ring s/p dilation, gastritis s/p biopsy, normal duodenum. Negative H.pylori.   HEMORRHOID BANDING  01/23/2014   Procedure: HEMORRHOID BANDING;  Surgeon: Danie Binder, MD;  [facility];  Service: Endoscopy;;   IR FLUORO GUIDE CV LINE LEFT  05/04/2022   IR FLUORO GUIDE CV LINE LEFT  05/13/2022   IR FLUORO GUIDE CV LINE LEFT  06/11/2022   IR US GUIDE VASC ACCESS LEFT  05/04/2022   JOINT REPLACEMENT Right 2017   LIVER BIOPSY N/A 03/23/2021   Procedure: LAPAROSCOPIC LIVER  BIOPSY;  Surgeon: [name], MD;  Location: [facility];  Service: General;  Laterality: N/A;   MOUTH SURGERY     POLYPECTOMY  07/06/2021   Procedure: POLYPECTOMY;  Surgeon: [name], DO;  [facility];  Service: Endoscopy;;   TOTAL HIP ARTHROPLASTY Right 06/04/2016   Procedure: RIGHT TOTAL HIP ARTHROPLASTY ANTERIOR APPROACH;  Surgeon: Mcarthur Rossetti, MD;  Location: [facility];  Service: Orthopedics;  Laterality: Right;   TOTAL HIP ARTHROPLASTY Left 03/02/2019   Procedure: LEFT TOTAL HIP ARTHROPLASTY ANTERIOR APPROACH;  Surgeon: Mcarthur Rossetti, MD;  Location: [facility];  Service: Orthopedics;  Laterality: Left;    Family History  Problem Relation Age of Onset  Cancer Mother    Colon cancer Neg Hx    Colon polyps Neg Hx     Allergies as of 06/24/2022   (No Known Allergies)    Social History   Socioeconomic History   Marital status: Single    Spouse name: Not on file   Number of children: 1   Years of education: 10   Highest education level: Not on file  Occupational History    Comment: [employer]  Tobacco Use   Smoking status: Former    Packs/day: 1.00    Years: 42.00    Total pack years: 42.00    Types: Cigarettes    Start date: 09/07/1971    Quit date: 05/28/2014    Years since quitting: 8.0    Passive exposure: Past   Smokeless tobacco: Never   Tobacco comments:    quit in November 2015 after smoking x 20 yrs.  Vaping Use   Vaping Use: Never used  Substance and Sexual Activity   Alcohol use: Not Currently    Alcohol/week: 0.0 standard drinks of alcohol   Drug use: No   Sexual activity: Not on file  Other Topics Concern   Not on file  Social History Narrative       patient consumes 4 cups of caffeine daily.      Patient stopped drinking years ago.   Social Determinants of Health   Financial Resource Strain: Not on file  Food Insecurity: No Food Insecurity (05/28/2022)   Hunger Vital Sign    Worried About Running Out of  Food in the Last Year: Never true    Ran Out of Food in the Last Year: Never true  Transportation Needs: No Transportation Needs (05/28/2022)   PRAPARE - Hydrologist (Medical): No    Lack of Transportation (Non-Medical): No  Physical Activity: Not on file  Stress: Not on file  Social Connections: Not on file     Review of Systems   Gen: Denies fever, chills, anorexia. Denies fatigue, weakness, weight loss.  CV: Denies chest pain, palpitations, syncope, peripheral edema, and claudication. Resp: Denies dyspnea at rest, cough, wheezing, coughing up blood, and pleurisy. GI: See HPI Derm: Denies rash, itching, dry skin Psych: Denies depression, anxiety, memory loss, confusion. No homicidal or suicidal ideation.  Heme: Denies bruising, bleeding, and enlarged lymph nodes.   Physical Exam   BP (!) 145/88 (BP Location: Right Arm, Patient Position: Sitting, Cuff Size: Large)   Pulse (!) 106   Temp 97.7 F (36.5 C) (Temporal)   Ht '5\' 8"'$  (1.727 m)   Wt 248 lb 3.2 oz (112.6 kg)   SpO2 93%   BMI 37.74 kg/m   General:   Alert and oriented. No distress noted. Pleasant and cooperative.  Head:  Normocephalic and atraumatic. Eyes:  Conjuctiva clear without scleral icterus. Lungs:  Clear to auscultation bilaterally. No wheezes, rales, or rhonchi. No distress.  Heart:  S1, S2 present without murmurs appreciated.  Abdomen:  +BS, soft, non-tender and non-distended. No rebound or guarding. No HSM or masses noted. Rectal: deferred Msk:  Symmetrical without gross deformities. Normal posture. Extremities:  Without edema. Neurologic:  Alert and  oriented x4 Psych:  Alert and cooperative. Normal mood and affect.   Assessment  Mathew Lara is a 61 y.o. male with a history of history of hepatitis C, genotype 1b treated with [name] 2016 with SVR, anxiety, COPD, CKD stage III, HTN, HLD, end-stage renal disease on dialysis Monday Wednesday  Friday, remote history of  alcohol and polysubstance abuse, and diabetes presenting today for hospital follow up.   Pancreatitis, pancreatic pseudocyst: Hospitalization in August 2023 with evidence of pancreatitis without any evidence of necrosis or pseudocyst.  Recall ultrasound with evidence of dilated CBD and increased liver echogenicity with normal Doppler.  After being discharged from [facility] 9/8 he represented to [facility] 9/22 for abdominal pain, nausea, vomiting and CT evidence of pseudocyst at the head and body of the pancreas with improvement in baseline pancreatitis.  No indication for drainage.  Elevated lipase to 251 at that time.  Original episode of pancreatitis felt to be secondary to hypertriglyceridemia and possible gallstone.  He was treated supportively with IV fluids.  Advised to repeat CT with pancreatic protocol in 8-12 weeks.  He has been feeling well overall.  Denies any abdominal pain, nausea, vomiting, or lack of appetite.  CT should be performed after 07/22/2022.  Constipation: Noted on CT scan 05/27/2022.  Likely secondary to opioids and iron supplementation.  Initially was taking MiraLAX and stool softener at rehab.  Is no longer taking MiraLAX and only taking Senokot daily.  Denies any further issues.  Reports regular soft daily bowel movements without the need to strain.  Continue over-the-counter stool softeners as needed.  Fatty Liver, history of hepatitis C: Secondary to advanced fiber versus likely in the setting of prior hepatitis C infection.  Treated with [name] in 2016 with achieved SVR.  Has been recommended undergo yearly ultrasounds.  Last ultrasound 04/21/2022 with CBD measuring 7 mm, evidence of cholecystectomy, no intrahepatic biliary ductal dilation.  Probable fatty infiltration of the liver unchanged.  No evidence of cirrhosis.  Most recent CMP 9/23 within normal limits.  GERD: Controlled on pantoprazole 40 mg daily.  History of colonic adenomas: Last colonoscopy October 2022 with  colonic adenomas.  Due for surveillance in 2025.  PLAN   CT pancreatic protocol after 07/22/22 Continue pantoprazole 40 mg daily Continue over the counter stool softener/laxative as needed.  Continue high fiber diet Avoid all NSAIDs, alcohol, and tobacco. Follow up with nephrology Follow up in clinic in 4 months or sooner if needed.     Venetia Night, MSN, FNP-BC, AGACNP-BC [facility]

## 2022-06-25 DIAGNOSIS — J449 Chronic obstructive pulmonary disease, unspecified: Secondary | ICD-10-CM | POA: Diagnosis not present

## 2022-06-25 DIAGNOSIS — U071 COVID-19: Secondary | ICD-10-CM | POA: Diagnosis not present

## 2022-06-29 DIAGNOSIS — K5900 Constipation, unspecified: Secondary | ICD-10-CM | POA: Diagnosis not present

## 2022-06-29 DIAGNOSIS — Z9181 History of falling: Secondary | ICD-10-CM | POA: Diagnosis not present

## 2022-06-29 DIAGNOSIS — Z9049 Acquired absence of other specified parts of digestive tract: Secondary | ICD-10-CM | POA: Diagnosis not present

## 2022-06-29 DIAGNOSIS — E1122 Type 2 diabetes mellitus with diabetic chronic kidney disease: Secondary | ICD-10-CM | POA: Diagnosis not present

## 2022-06-29 DIAGNOSIS — F17210 Nicotine dependence, cigarettes, uncomplicated: Secondary | ICD-10-CM | POA: Diagnosis not present

## 2022-06-29 DIAGNOSIS — G894 Chronic pain syndrome: Secondary | ICD-10-CM | POA: Diagnosis not present

## 2022-06-29 DIAGNOSIS — I5032 Chronic diastolic (congestive) heart failure: Secondary | ICD-10-CM | POA: Diagnosis not present

## 2022-06-29 DIAGNOSIS — J449 Chronic obstructive pulmonary disease, unspecified: Secondary | ICD-10-CM | POA: Diagnosis not present

## 2022-06-29 DIAGNOSIS — Z794 Long term (current) use of insulin: Secondary | ICD-10-CM | POA: Diagnosis not present

## 2022-06-29 DIAGNOSIS — I132 Hypertensive heart and chronic kidney disease with heart failure and with stage 5 chronic kidney disease, or end stage renal disease: Secondary | ICD-10-CM | POA: Diagnosis not present

## 2022-06-29 DIAGNOSIS — N186 End stage renal disease: Secondary | ICD-10-CM | POA: Diagnosis not present

## 2022-06-29 DIAGNOSIS — E43 Unspecified severe protein-calorie malnutrition: Secondary | ICD-10-CM | POA: Diagnosis not present

## 2022-06-29 DIAGNOSIS — E785 Hyperlipidemia, unspecified: Secondary | ICD-10-CM | POA: Diagnosis not present

## 2022-06-29 DIAGNOSIS — I959 Hypotension, unspecified: Secondary | ICD-10-CM | POA: Diagnosis not present

## 2022-06-29 DIAGNOSIS — Z96642 Presence of left artificial hip joint: Secondary | ICD-10-CM | POA: Diagnosis not present

## 2022-06-29 DIAGNOSIS — Z7982 Long term (current) use of aspirin: Secondary | ICD-10-CM | POA: Diagnosis not present

## 2022-06-29 DIAGNOSIS — G4733 Obstructive sleep apnea (adult) (pediatric): Secondary | ICD-10-CM | POA: Diagnosis not present

## 2022-06-29 DIAGNOSIS — K219 Gastro-esophageal reflux disease without esophagitis: Secondary | ICD-10-CM | POA: Diagnosis not present

## 2022-06-29 DIAGNOSIS — Z992 Dependence on renal dialysis: Secondary | ICD-10-CM | POA: Diagnosis not present

## 2022-06-29 DIAGNOSIS — D631 Anemia in chronic kidney disease: Secondary | ICD-10-CM | POA: Diagnosis not present

## 2022-06-30 DIAGNOSIS — N186 End stage renal disease: Secondary | ICD-10-CM | POA: Diagnosis not present

## 2022-06-30 DIAGNOSIS — Z7982 Long term (current) use of aspirin: Secondary | ICD-10-CM | POA: Diagnosis not present

## 2022-06-30 DIAGNOSIS — E43 Unspecified severe protein-calorie malnutrition: Secondary | ICD-10-CM | POA: Diagnosis not present

## 2022-06-30 DIAGNOSIS — G4733 Obstructive sleep apnea (adult) (pediatric): Secondary | ICD-10-CM | POA: Diagnosis not present

## 2022-06-30 DIAGNOSIS — Z992 Dependence on renal dialysis: Secondary | ICD-10-CM | POA: Diagnosis not present

## 2022-06-30 DIAGNOSIS — I959 Hypotension, unspecified: Secondary | ICD-10-CM | POA: Diagnosis not present

## 2022-06-30 DIAGNOSIS — F17210 Nicotine dependence, cigarettes, uncomplicated: Secondary | ICD-10-CM | POA: Diagnosis not present

## 2022-06-30 DIAGNOSIS — G894 Chronic pain syndrome: Secondary | ICD-10-CM | POA: Diagnosis not present

## 2022-06-30 DIAGNOSIS — K5900 Constipation, unspecified: Secondary | ICD-10-CM | POA: Diagnosis not present

## 2022-06-30 DIAGNOSIS — Z9049 Acquired absence of other specified parts of digestive tract: Secondary | ICD-10-CM | POA: Diagnosis not present

## 2022-06-30 DIAGNOSIS — E1122 Type 2 diabetes mellitus with diabetic chronic kidney disease: Secondary | ICD-10-CM | POA: Diagnosis not present

## 2022-06-30 DIAGNOSIS — I5032 Chronic diastolic (congestive) heart failure: Secondary | ICD-10-CM | POA: Diagnosis not present

## 2022-06-30 DIAGNOSIS — Z96642 Presence of left artificial hip joint: Secondary | ICD-10-CM | POA: Diagnosis not present

## 2022-06-30 DIAGNOSIS — J449 Chronic obstructive pulmonary disease, unspecified: Secondary | ICD-10-CM | POA: Diagnosis not present

## 2022-06-30 DIAGNOSIS — Z794 Long term (current) use of insulin: Secondary | ICD-10-CM | POA: Diagnosis not present

## 2022-06-30 DIAGNOSIS — K219 Gastro-esophageal reflux disease without esophagitis: Secondary | ICD-10-CM | POA: Diagnosis not present

## 2022-06-30 DIAGNOSIS — I132 Hypertensive heart and chronic kidney disease with heart failure and with stage 5 chronic kidney disease, or end stage renal disease: Secondary | ICD-10-CM | POA: Diagnosis not present

## 2022-06-30 DIAGNOSIS — D631 Anemia in chronic kidney disease: Secondary | ICD-10-CM | POA: Diagnosis not present

## 2022-06-30 DIAGNOSIS — E785 Hyperlipidemia, unspecified: Secondary | ICD-10-CM | POA: Diagnosis not present

## 2022-06-30 DIAGNOSIS — Z9181 History of falling: Secondary | ICD-10-CM | POA: Diagnosis not present

## 2022-07-01 ENCOUNTER — Ambulatory Visit: Payer: Medicare Other | Admitting: Internal Medicine

## 2022-07-06 ENCOUNTER — Ambulatory Visit: Payer: Medicare Other | Admitting: Internal Medicine

## 2022-07-06 DIAGNOSIS — N390 Urinary tract infection, site not specified: Secondary | ICD-10-CM | POA: Diagnosis not present

## 2022-07-06 DIAGNOSIS — N189 Chronic kidney disease, unspecified: Secondary | ICD-10-CM | POA: Diagnosis not present

## 2022-07-06 DIAGNOSIS — N184 Chronic kidney disease, stage 4 (severe): Secondary | ICD-10-CM | POA: Diagnosis not present

## 2022-07-06 DIAGNOSIS — N2581 Secondary hyperparathyroidism of renal origin: Secondary | ICD-10-CM | POA: Diagnosis not present

## 2022-07-07 ENCOUNTER — Ambulatory Visit: Payer: Medicare Other | Admitting: Gastroenterology

## 2022-07-07 DIAGNOSIS — E43 Unspecified severe protein-calorie malnutrition: Secondary | ICD-10-CM | POA: Diagnosis not present

## 2022-07-07 DIAGNOSIS — F17210 Nicotine dependence, cigarettes, uncomplicated: Secondary | ICD-10-CM | POA: Diagnosis not present

## 2022-07-07 DIAGNOSIS — D631 Anemia in chronic kidney disease: Secondary | ICD-10-CM | POA: Diagnosis not present

## 2022-07-07 DIAGNOSIS — Z9049 Acquired absence of other specified parts of digestive tract: Secondary | ICD-10-CM | POA: Diagnosis not present

## 2022-07-07 DIAGNOSIS — G894 Chronic pain syndrome: Secondary | ICD-10-CM | POA: Diagnosis not present

## 2022-07-07 DIAGNOSIS — K5900 Constipation, unspecified: Secondary | ICD-10-CM | POA: Diagnosis not present

## 2022-07-07 DIAGNOSIS — Z7982 Long term (current) use of aspirin: Secondary | ICD-10-CM | POA: Diagnosis not present

## 2022-07-07 DIAGNOSIS — Z96642 Presence of left artificial hip joint: Secondary | ICD-10-CM | POA: Diagnosis not present

## 2022-07-07 DIAGNOSIS — N186 End stage renal disease: Secondary | ICD-10-CM | POA: Diagnosis not present

## 2022-07-07 DIAGNOSIS — E785 Hyperlipidemia, unspecified: Secondary | ICD-10-CM | POA: Diagnosis not present

## 2022-07-07 DIAGNOSIS — Z992 Dependence on renal dialysis: Secondary | ICD-10-CM | POA: Diagnosis not present

## 2022-07-07 DIAGNOSIS — E1122 Type 2 diabetes mellitus with diabetic chronic kidney disease: Secondary | ICD-10-CM | POA: Diagnosis not present

## 2022-07-07 DIAGNOSIS — Z9181 History of falling: Secondary | ICD-10-CM | POA: Diagnosis not present

## 2022-07-07 DIAGNOSIS — J449 Chronic obstructive pulmonary disease, unspecified: Secondary | ICD-10-CM | POA: Diagnosis not present

## 2022-07-07 DIAGNOSIS — I959 Hypotension, unspecified: Secondary | ICD-10-CM | POA: Diagnosis not present

## 2022-07-07 DIAGNOSIS — I132 Hypertensive heart and chronic kidney disease with heart failure and with stage 5 chronic kidney disease, or end stage renal disease: Secondary | ICD-10-CM | POA: Diagnosis not present

## 2022-07-07 DIAGNOSIS — G4733 Obstructive sleep apnea (adult) (pediatric): Secondary | ICD-10-CM | POA: Diagnosis not present

## 2022-07-07 DIAGNOSIS — K219 Gastro-esophageal reflux disease without esophagitis: Secondary | ICD-10-CM | POA: Diagnosis not present

## 2022-07-07 DIAGNOSIS — Z794 Long term (current) use of insulin: Secondary | ICD-10-CM | POA: Diagnosis not present

## 2022-07-07 DIAGNOSIS — I5032 Chronic diastolic (congestive) heart failure: Secondary | ICD-10-CM | POA: Diagnosis not present

## 2022-07-08 DIAGNOSIS — R945 Abnormal results of liver function studies: Secondary | ICD-10-CM | POA: Diagnosis not present

## 2022-07-08 DIAGNOSIS — J449 Chronic obstructive pulmonary disease, unspecified: Secondary | ICD-10-CM | POA: Diagnosis not present

## 2022-07-08 DIAGNOSIS — I10 Essential (primary) hypertension: Secondary | ICD-10-CM | POA: Diagnosis not present

## 2022-07-08 DIAGNOSIS — I5032 Chronic diastolic (congestive) heart failure: Secondary | ICD-10-CM | POA: Diagnosis not present

## 2022-07-08 DIAGNOSIS — G894 Chronic pain syndrome: Secondary | ICD-10-CM | POA: Diagnosis not present

## 2022-07-08 DIAGNOSIS — Z992 Dependence on renal dialysis: Secondary | ICD-10-CM | POA: Diagnosis not present

## 2022-07-08 DIAGNOSIS — E1122 Type 2 diabetes mellitus with diabetic chronic kidney disease: Secondary | ICD-10-CM | POA: Diagnosis not present

## 2022-07-08 DIAGNOSIS — N186 End stage renal disease: Secondary | ICD-10-CM | POA: Diagnosis not present

## 2022-07-08 DIAGNOSIS — I132 Hypertensive heart and chronic kidney disease with heart failure and with stage 5 chronic kidney disease, or end stage renal disease: Secondary | ICD-10-CM | POA: Diagnosis not present

## 2022-07-12 ENCOUNTER — Ambulatory Visit: Payer: Medicare Other | Admitting: Internal Medicine

## 2022-07-12 DIAGNOSIS — Z992 Dependence on renal dialysis: Secondary | ICD-10-CM | POA: Diagnosis not present

## 2022-07-12 DIAGNOSIS — N186 End stage renal disease: Secondary | ICD-10-CM | POA: Diagnosis not present

## 2022-07-12 DIAGNOSIS — Z452 Encounter for adjustment and management of vascular access device: Secondary | ICD-10-CM | POA: Diagnosis not present

## 2022-07-13 ENCOUNTER — Other Ambulatory Visit: Payer: Self-pay

## 2022-07-13 DIAGNOSIS — D631 Anemia in chronic kidney disease: Secondary | ICD-10-CM

## 2022-07-14 ENCOUNTER — Encounter (HOSPITAL_COMMUNITY)
Admission: RE | Admit: 2022-07-14 | Discharge: 2022-07-14 | Disposition: A | Discharge status: Home / Self Care | Payer: Medicare Other | Source: Ambulatory Visit | Attending: Nephrology | Admitting: Nephrology

## 2022-07-14 VITALS — BP 148/99 | HR 95 | Temp 98.7°F | Resp 16

## 2022-07-14 DIAGNOSIS — N184 Chronic kidney disease, stage 4 (severe): Secondary | ICD-10-CM | POA: Insufficient documentation

## 2022-07-14 DIAGNOSIS — D631 Anemia in chronic kidney disease: Secondary | ICD-10-CM | POA: Diagnosis not present

## 2022-07-14 LAB — RENAL FUNCTION PANEL
Albumin: 3.7 g/dL (ref 3.5–5.0)
Anion gap: 10 (ref 5–15)
BUN: 40 mg/dL — ABNORMAL HIGH (ref 8–23)
CO2: 18 mmol/L — ABNORMAL LOW (ref 22–32)
Calcium: 9.5 mg/dL (ref 8.9–10.3)
Chloride: 109 mmol/L (ref 98–111)
Creatinine, Ser: 2.86 mg/dL — ABNORMAL HIGH (ref 0.61–1.24)
GFR, Estimated: 24 mL/min — ABNORMAL LOW (ref >60)
Glucose, Bld: 96 mg/dL (ref 70–99)
Phosphorus: 3.8 mg/dL (ref 2.5–4.6)
Potassium: 4.9 mmol/L (ref 3.5–5.1)
Sodium: 137 mmol/L (ref 135–145)

## 2022-07-14 LAB — IRON AND TIBC
Iron: 45 ug/dL (ref 45–182)
Saturation Ratios: 15 % — ABNORMAL LOW (ref 17.9–39.5)
TIBC: 305 ug/dL (ref 250–450)
UIBC: 260 ug/dL

## 2022-07-14 LAB — FERRITIN: Ferritin: 638 ng/mL — ABNORMAL HIGH (ref 24–336)

## 2022-07-14 MED ORDER — DARBEPOETIN ALFA 60 MCG/0.3ML IJ SOSY
PREFILLED_SYRINGE | INTRAMUSCULAR | Status: AC
  Filled 2022-07-14: qty 0.3

## 2022-07-14 MED ORDER — DIPHENHYDRAMINE HCL 25 MG PO CAPS
25.0000 mg | ORAL_CAPSULE | Freq: Once | ORAL | Status: AC
  Administered 2022-07-14: 25 mg via ORAL
  Filled 2022-07-14: qty 1

## 2022-07-14 MED ORDER — SODIUM CHLORIDE 0.9 % IV SOLN
510.0000 mg | Freq: Once | INTRAVENOUS | Status: AC
  Administered 2022-07-14: 510 mg via INTRAVENOUS
  Filled 2022-07-14: qty 17

## 2022-07-14 MED ORDER — CLONIDINE HCL 0.1 MG PO TABS: 0.1000 mg | ORAL_TABLET | ORAL | Status: DC | PRN

## 2022-07-14 MED ORDER — DARBEPOETIN ALFA 60 MCG/0.3ML IJ SOSY
60.0000 ug | PREFILLED_SYRINGE | Freq: Once | INTRAMUSCULAR | Status: AC
  Administered 2022-07-14: 60 ug via SUBCUTANEOUS

## 2022-07-14 MED ORDER — ACETAMINOPHEN 325 MG PO TABS
650.0000 mg | ORAL_TABLET | Freq: Once | ORAL | Status: AC
  Administered 2022-07-14: 650 mg via ORAL
  Filled 2022-07-14: qty 2

## 2022-07-14 NOTE — Progress Notes (Signed)
Diagnosis: Anemia in Chronic Kidney Disease  Provider:  Donato Heinz MD  Procedure: Infusion  IV Type: Peripheral, IV Location: L Antecubital  Feraheme (Ferumoxytol), Dose: 510 mg  Infusion Start Time: 1423 pm  Infusion Stop Time: 1445 pm  Post Infusion IV Care: Observation period completed and Peripheral IV Discontinued  Patient complained of some congestion and coughing after infusion finished,  coughed up a small amt of phlegm, vital signs stable  Discharge: Condition: Good, Destination: Home . AVS provided to patient.   Performed by:  Oren Beckmann, RN     Diagnosis: Anemia in Chronic Kidney Disease  Provider:  Donato Heinz MD  Procedure: Injection  Aranesp (darbepoetin alfa) , Dose: 60 mg, Site: subcutaneous, Number of injections: 1  Post Care: Observation period completed  Discharge: Condition: Good, Destination: Home . AVS provided to patient.   Performed by:  Oren Beckmann, RN

## 2022-07-15 LAB — POCT HEMOGLOBIN-HEMACUE: Hemoglobin: 7.9 g/dL — ABNORMAL LOW (ref 13.0–17.0)

## 2022-07-16 DIAGNOSIS — Z79891 Long term (current) use of opiate analgesic: Secondary | ICD-10-CM | POA: Diagnosis not present

## 2022-07-16 DIAGNOSIS — G4700 Insomnia, unspecified: Secondary | ICD-10-CM | POA: Diagnosis not present

## 2022-07-16 DIAGNOSIS — M545 Low back pain, unspecified: Secondary | ICD-10-CM | POA: Diagnosis not present

## 2022-07-16 DIAGNOSIS — M25519 Pain in unspecified shoulder: Secondary | ICD-10-CM | POA: Diagnosis not present

## 2022-07-16 DIAGNOSIS — M25559 Pain in unspecified hip: Secondary | ICD-10-CM | POA: Diagnosis not present

## 2022-07-20 DIAGNOSIS — N186 End stage renal disease: Secondary | ICD-10-CM | POA: Diagnosis not present

## 2022-07-20 DIAGNOSIS — Z9181 History of falling: Secondary | ICD-10-CM | POA: Diagnosis not present

## 2022-07-20 DIAGNOSIS — I959 Hypotension, unspecified: Secondary | ICD-10-CM | POA: Diagnosis not present

## 2022-07-20 DIAGNOSIS — D631 Anemia in chronic kidney disease: Secondary | ICD-10-CM | POA: Diagnosis not present

## 2022-07-20 DIAGNOSIS — E1122 Type 2 diabetes mellitus with diabetic chronic kidney disease: Secondary | ICD-10-CM | POA: Diagnosis not present

## 2022-07-20 DIAGNOSIS — Z9049 Acquired absence of other specified parts of digestive tract: Secondary | ICD-10-CM | POA: Diagnosis not present

## 2022-07-20 DIAGNOSIS — Z7982 Long term (current) use of aspirin: Secondary | ICD-10-CM | POA: Diagnosis not present

## 2022-07-20 DIAGNOSIS — Z992 Dependence on renal dialysis: Secondary | ICD-10-CM | POA: Diagnosis not present

## 2022-07-20 DIAGNOSIS — E43 Unspecified severe protein-calorie malnutrition: Secondary | ICD-10-CM | POA: Diagnosis not present

## 2022-07-20 DIAGNOSIS — F17210 Nicotine dependence, cigarettes, uncomplicated: Secondary | ICD-10-CM | POA: Diagnosis not present

## 2022-07-20 DIAGNOSIS — Z96642 Presence of left artificial hip joint: Secondary | ICD-10-CM | POA: Diagnosis not present

## 2022-07-20 DIAGNOSIS — G894 Chronic pain syndrome: Secondary | ICD-10-CM | POA: Diagnosis not present

## 2022-07-20 DIAGNOSIS — E785 Hyperlipidemia, unspecified: Secondary | ICD-10-CM | POA: Diagnosis not present

## 2022-07-20 DIAGNOSIS — K5900 Constipation, unspecified: Secondary | ICD-10-CM | POA: Diagnosis not present

## 2022-07-20 DIAGNOSIS — G4733 Obstructive sleep apnea (adult) (pediatric): Secondary | ICD-10-CM | POA: Diagnosis not present

## 2022-07-20 DIAGNOSIS — Z794 Long term (current) use of insulin: Secondary | ICD-10-CM | POA: Diagnosis not present

## 2022-07-20 DIAGNOSIS — J449 Chronic obstructive pulmonary disease, unspecified: Secondary | ICD-10-CM | POA: Diagnosis not present

## 2022-07-20 DIAGNOSIS — I5032 Chronic diastolic (congestive) heart failure: Secondary | ICD-10-CM | POA: Diagnosis not present

## 2022-07-20 DIAGNOSIS — I132 Hypertensive heart and chronic kidney disease with heart failure and with stage 5 chronic kidney disease, or end stage renal disease: Secondary | ICD-10-CM | POA: Diagnosis not present

## 2022-07-20 DIAGNOSIS — K219 Gastro-esophageal reflux disease without esophagitis: Secondary | ICD-10-CM | POA: Diagnosis not present

## 2022-07-21 ENCOUNTER — Encounter (HOSPITAL_COMMUNITY)
Admission: RE | Admit: 2022-07-21 | Discharge: 2022-07-21 | Disposition: A | Discharge status: Home / Self Care | Payer: Medicare Other | Source: Ambulatory Visit | Attending: Nephrology | Admitting: Nephrology

## 2022-07-21 VITALS — BP 129/94 | HR 91 | Temp 98.4°F | Resp 16

## 2022-07-21 DIAGNOSIS — D631 Anemia in chronic kidney disease: Secondary | ICD-10-CM

## 2022-07-21 DIAGNOSIS — N184 Chronic kidney disease, stage 4 (severe): Secondary | ICD-10-CM | POA: Diagnosis not present

## 2022-07-21 MED ORDER — DIPHENHYDRAMINE HCL 25 MG PO CAPS: 25.0000 mg | ORAL_CAPSULE | Freq: Once | ORAL | Status: DC

## 2022-07-21 MED ORDER — SODIUM CHLORIDE 0.9 % IV SOLN
510.0000 mg | Freq: Once | INTRAVENOUS | Status: AC
  Administered 2022-07-21: 510 mg via INTRAVENOUS
  Filled 2022-07-21: qty 17

## 2022-07-21 MED ORDER — ACETAMINOPHEN 325 MG PO TABS: 650.0000 mg | ORAL_TABLET | Freq: Once | ORAL | Status: DC

## 2022-07-21 NOTE — Progress Notes (Signed)
Diagnosis: Anemia in Chronic Kidney Disease  Provider:  Donato Heinz MD  Procedure: Infusion  IV Type: Peripheral, IV Location: L Hand  Feraheme (Ferumoxytol), Dose: 510 mg  Infusion Start Time: 7517  Infusion Stop Time: 0017  Post Infusion IV Care: Patient declined observation and Peripheral IV Discontinued  Discharge: Condition: Good, Destination: Home . AVS provided to patient.   Performed by:  Jonelle Sidle, RN

## 2022-07-26 DIAGNOSIS — U071 COVID-19: Secondary | ICD-10-CM | POA: Diagnosis not present

## 2022-07-26 DIAGNOSIS — J449 Chronic obstructive pulmonary disease, unspecified: Secondary | ICD-10-CM | POA: Diagnosis not present

## 2022-07-28 ENCOUNTER — Encounter (HOSPITAL_COMMUNITY)
Admission: RE | Admit: 2022-07-28 | Discharge: 2022-07-28 | Disposition: A | Discharge status: Home / Self Care | Payer: Medicare Other | Source: Ambulatory Visit | Attending: Nephrology | Admitting: Nephrology

## 2022-07-28 VITALS — BP 142/100 | HR 85 | Temp 98.3°F | Resp 18

## 2022-07-28 DIAGNOSIS — D631 Anemia in chronic kidney disease: Secondary | ICD-10-CM

## 2022-07-28 DIAGNOSIS — N184 Chronic kidney disease, stage 4 (severe): Secondary | ICD-10-CM | POA: Diagnosis not present

## 2022-07-28 LAB — POCT HEMOGLOBIN-HEMACUE: Hemoglobin: 9.8 g/dL — ABNORMAL LOW (ref 13.0–17.0)

## 2022-07-28 MED ORDER — DARBEPOETIN ALFA 60 MCG/0.3ML IJ SOSY: 60.0000 ug | PREFILLED_SYRINGE | Freq: Once | INTRAMUSCULAR | Status: DC

## 2022-07-28 NOTE — Progress Notes (Signed)
Hemocue 9.8 - prior to giving Aranesp realized that the interval was 4 weeks and not 2 weeks. Spoke with Cathie Beams at Dr. Elissa Hefty office and she checked with MD and they stated to wait the additional 2 weeks. Patient aware of change.

## 2022-08-04 DIAGNOSIS — Z794 Long term (current) use of insulin: Secondary | ICD-10-CM | POA: Diagnosis not present

## 2022-08-04 DIAGNOSIS — N186 End stage renal disease: Secondary | ICD-10-CM | POA: Diagnosis not present

## 2022-08-04 DIAGNOSIS — G4733 Obstructive sleep apnea (adult) (pediatric): Secondary | ICD-10-CM | POA: Diagnosis not present

## 2022-08-04 DIAGNOSIS — Z992 Dependence on renal dialysis: Secondary | ICD-10-CM | POA: Diagnosis not present

## 2022-08-04 DIAGNOSIS — Z9049 Acquired absence of other specified parts of digestive tract: Secondary | ICD-10-CM | POA: Diagnosis not present

## 2022-08-04 DIAGNOSIS — Z9181 History of falling: Secondary | ICD-10-CM | POA: Diagnosis not present

## 2022-08-04 DIAGNOSIS — Z7982 Long term (current) use of aspirin: Secondary | ICD-10-CM | POA: Diagnosis not present

## 2022-08-04 DIAGNOSIS — I959 Hypotension, unspecified: Secondary | ICD-10-CM | POA: Diagnosis not present

## 2022-08-04 DIAGNOSIS — Z96642 Presence of left artificial hip joint: Secondary | ICD-10-CM | POA: Diagnosis not present

## 2022-08-04 DIAGNOSIS — K219 Gastro-esophageal reflux disease without esophagitis: Secondary | ICD-10-CM | POA: Diagnosis not present

## 2022-08-04 DIAGNOSIS — G894 Chronic pain syndrome: Secondary | ICD-10-CM | POA: Diagnosis not present

## 2022-08-04 DIAGNOSIS — E43 Unspecified severe protein-calorie malnutrition: Secondary | ICD-10-CM | POA: Diagnosis not present

## 2022-08-04 DIAGNOSIS — I132 Hypertensive heart and chronic kidney disease with heart failure and with stage 5 chronic kidney disease, or end stage renal disease: Secondary | ICD-10-CM | POA: Diagnosis not present

## 2022-08-04 DIAGNOSIS — K5900 Constipation, unspecified: Secondary | ICD-10-CM | POA: Diagnosis not present

## 2022-08-04 DIAGNOSIS — F17210 Nicotine dependence, cigarettes, uncomplicated: Secondary | ICD-10-CM | POA: Diagnosis not present

## 2022-08-04 DIAGNOSIS — J449 Chronic obstructive pulmonary disease, unspecified: Secondary | ICD-10-CM | POA: Diagnosis not present

## 2022-08-04 DIAGNOSIS — I5032 Chronic diastolic (congestive) heart failure: Secondary | ICD-10-CM | POA: Diagnosis not present

## 2022-08-04 DIAGNOSIS — E785 Hyperlipidemia, unspecified: Secondary | ICD-10-CM | POA: Diagnosis not present

## 2022-08-04 DIAGNOSIS — E1122 Type 2 diabetes mellitus with diabetic chronic kidney disease: Secondary | ICD-10-CM | POA: Diagnosis not present

## 2022-08-04 DIAGNOSIS — D631 Anemia in chronic kidney disease: Secondary | ICD-10-CM | POA: Diagnosis not present

## 2022-08-11 ENCOUNTER — Encounter (HOSPITAL_COMMUNITY)
Admission: RE | Admit: 2022-08-11 | Discharge: 2022-08-11 | Disposition: A | Discharge status: Home / Self Care | Payer: Medicare Other | Source: Ambulatory Visit | Attending: Nephrology | Admitting: Nephrology

## 2022-08-11 VITALS — BP 127/100 | HR 92 | Temp 97.4°F | Resp 18

## 2022-08-11 DIAGNOSIS — N184 Chronic kidney disease, stage 4 (severe): Secondary | ICD-10-CM | POA: Diagnosis not present

## 2022-08-11 DIAGNOSIS — D631 Anemia in chronic kidney disease: Secondary | ICD-10-CM | POA: Diagnosis not present

## 2022-08-11 LAB — RENAL FUNCTION PANEL
Albumin: 4.3 g/dL (ref 3.5–5.0)
Anion gap: 7 (ref 5–15)
BUN: 29 mg/dL — ABNORMAL HIGH (ref 8–23)
CO2: 22 mmol/L (ref 22–32)
Calcium: 9.7 mg/dL (ref 8.9–10.3)
Chloride: 104 mmol/L (ref 98–111)
Creatinine, Ser: 2.92 mg/dL — ABNORMAL HIGH (ref 0.61–1.24)
GFR, Estimated: 24 mL/min — ABNORMAL LOW (ref >60)
Glucose, Bld: 100 mg/dL — ABNORMAL HIGH (ref 70–99)
Phosphorus: 4.1 mg/dL (ref 2.5–4.6)
Potassium: 4.4 mmol/L (ref 3.5–5.1)
Sodium: 133 mmol/L — ABNORMAL LOW (ref 135–145)

## 2022-08-11 LAB — POCT HEMOGLOBIN-HEMACUE: Hemoglobin: 10.8 g/dL — ABNORMAL LOW (ref 13.0–17.0)

## 2022-08-11 LAB — IRON AND TIBC
Iron: 64 ug/dL (ref 45–182)
Saturation Ratios: 21 % (ref 17.9–39.5)
TIBC: 307 ug/dL (ref 250–450)
UIBC: 243 ug/dL

## 2022-08-11 LAB — FERRITIN: Ferritin: 952 ng/mL — ABNORMAL HIGH (ref 24–336)

## 2022-08-11 MED ORDER — CLONIDINE HCL 0.1 MG PO TABS: 0.1000 mg | ORAL_TABLET | ORAL | Status: DC | PRN

## 2022-08-11 MED ORDER — DARBEPOETIN ALFA 60 MCG/0.3ML IJ SOSY
60.0000 ug | PREFILLED_SYRINGE | Freq: Once | INTRAMUSCULAR | Status: AC
  Administered 2022-08-11: 60 ug via SUBCUTANEOUS
  Filled 2022-08-11: qty 0.3

## 2022-08-11 NOTE — Progress Notes (Addendum)
Diagnosis: Anemia in Chronic Kidney Disease  Provider:  Donato Heinz MD  Procedure: Injection  Aranesp (darbepoetin alfa) , Dose: 60 mcg, Site: subcutaneous, Number of injections: 1  Hemoglobin 10.8  Post Care: Patient declined observation  Discharge: Condition: Good, Destination: Home . AVS provided to patient.   Performed by:  Binnie Kand, RN

## 2022-08-25 ENCOUNTER — Encounter (HOSPITAL_COMMUNITY): Payer: Medicare Other

## 2022-08-25 DIAGNOSIS — J449 Chronic obstructive pulmonary disease, unspecified: Secondary | ICD-10-CM | POA: Diagnosis not present

## 2022-08-25 DIAGNOSIS — U071 COVID-19: Secondary | ICD-10-CM | POA: Diagnosis not present

## 2022-08-26 DIAGNOSIS — E1122 Type 2 diabetes mellitus with diabetic chronic kidney disease: Secondary | ICD-10-CM | POA: Diagnosis not present

## 2022-08-26 DIAGNOSIS — D631 Anemia in chronic kidney disease: Secondary | ICD-10-CM | POA: Diagnosis not present

## 2022-08-26 DIAGNOSIS — N184 Chronic kidney disease, stage 4 (severe): Secondary | ICD-10-CM | POA: Diagnosis not present

## 2022-08-26 DIAGNOSIS — Z9889 Other specified postprocedural states: Secondary | ICD-10-CM | POA: Diagnosis not present

## 2022-08-26 DIAGNOSIS — N2581 Secondary hyperparathyroidism of renal origin: Secondary | ICD-10-CM | POA: Diagnosis not present

## 2022-08-26 DIAGNOSIS — I129 Hypertensive chronic kidney disease with stage 1 through stage 4 chronic kidney disease, or unspecified chronic kidney disease: Secondary | ICD-10-CM | POA: Diagnosis not present

## 2022-08-26 DIAGNOSIS — J449 Chronic obstructive pulmonary disease, unspecified: Secondary | ICD-10-CM | POA: Diagnosis not present

## 2022-09-08 ENCOUNTER — Encounter (HOSPITAL_COMMUNITY)
Admission: RE | Admit: 2022-09-08 | Discharge: 2022-09-08 | Disposition: A | Discharge status: Home / Self Care | Payer: Medicare Other | Source: Ambulatory Visit | Attending: Nephrology | Admitting: Nephrology

## 2022-09-08 VITALS — BP 167/96 | HR 93 | Temp 97.8°F | Resp 19

## 2022-09-08 DIAGNOSIS — D631 Anemia in chronic kidney disease: Secondary | ICD-10-CM | POA: Insufficient documentation

## 2022-09-08 DIAGNOSIS — N184 Chronic kidney disease, stage 4 (severe): Secondary | ICD-10-CM | POA: Diagnosis not present

## 2022-09-08 LAB — RENAL FUNCTION PANEL
Albumin: 4.3 g/dL (ref 3.5–5.0)
Anion gap: 11 (ref 5–15)
BUN: 28 mg/dL — ABNORMAL HIGH (ref 8–23)
CO2: 26 mmol/L (ref 22–32)
Calcium: 9.8 mg/dL (ref 8.9–10.3)
Chloride: 100 mmol/L (ref 98–111)
Creatinine, Ser: 2.88 mg/dL — ABNORMAL HIGH (ref 0.61–1.24)
GFR, Estimated: 24 mL/min — ABNORMAL LOW (ref >60)
Glucose, Bld: 101 mg/dL — ABNORMAL HIGH (ref 70–99)
Phosphorus: 3.5 mg/dL (ref 2.5–4.6)
Potassium: 4.7 mmol/L (ref 3.5–5.1)
Sodium: 137 mmol/L (ref 135–145)

## 2022-09-08 LAB — FERRITIN: Ferritin: 814 ng/mL — ABNORMAL HIGH (ref 24–336)

## 2022-09-08 LAB — POCT HEMOGLOBIN-HEMACUE: Hemoglobin: 8.1 g/dL — ABNORMAL LOW (ref 13.0–17.0)

## 2022-09-08 LAB — IRON AND TIBC
Iron: 63 ug/dL (ref 45–182)
Saturation Ratios: 22 % (ref 17.9–39.5)
TIBC: 291 ug/dL (ref 250–450)
UIBC: 228 ug/dL

## 2022-09-08 MED ORDER — CLONIDINE HCL 0.1 MG PO TABS: 0.1000 mg | ORAL_TABLET | ORAL | Status: DC | PRN

## 2022-09-08 MED ORDER — DARBEPOETIN ALFA 60 MCG/0.3ML IJ SOSY
60.0000 ug | PREFILLED_SYRINGE | Freq: Once | INTRAMUSCULAR | Status: AC
  Administered 2022-09-08: 60 ug via SUBCUTANEOUS
  Filled 2022-09-08: qty 0.3

## 2022-09-08 NOTE — Progress Notes (Signed)
Diagnosis: Anemia in Chronic Kidney Disease  Provider:  Donato Heinz MD  Procedure: Injection  Aranesp (darbepoetin alfa) , Dose: 60 mg, Site: subcutaneous, Number of injections: 1  Post Care: Observation period completed  Discharge: Condition: Good, Destination: Home . AVS provided to patient.   Performed by:  Hughie Closs, RN

## 2022-09-08 NOTE — Addendum Note (Signed)
Encounter addended by: Baxter Hire, RN on: 09/08/2022 2:33 PM  Actions taken: Therapy plan modified

## 2022-09-25 DIAGNOSIS — J449 Chronic obstructive pulmonary disease, unspecified: Secondary | ICD-10-CM | POA: Diagnosis not present

## 2022-09-25 DIAGNOSIS — U071 COVID-19: Secondary | ICD-10-CM | POA: Diagnosis not present

## 2022-09-28 ENCOUNTER — Telehealth: Payer: Self-pay | Admitting: Gastroenterology

## 2022-09-28 DIAGNOSIS — Z8719 Personal history of other diseases of the digestive system: Secondary | ICD-10-CM

## 2022-09-28 DIAGNOSIS — K863 Pseudocyst of pancreas: Secondary | ICD-10-CM

## 2022-09-28 NOTE — Telephone Encounter (Signed)
Patient called and said he was supposed to go to [facility] sometime and get an ultrasound.  He said that he hasn't heard anything about it and wanted to know is he still supposed to go.

## 2022-09-29 ENCOUNTER — Telehealth: Payer: Self-pay | Admitting: *Deleted

## 2022-09-29 NOTE — Telephone Encounter (Signed)
error 

## 2022-09-29 NOTE — Telephone Encounter (Signed)
Pt called and wants to know when his next ultrasound is. Please advise.

## 2022-09-29 NOTE — Telephone Encounter (Signed)
Noted  

## 2022-10-05 NOTE — Telephone Encounter (Signed)
CT scheduled for 2/9, arrival 1:00pm, npo 4 hrs prior.   Called pt and he is aware of appt details.

## 2022-10-06 ENCOUNTER — Encounter (HOSPITAL_COMMUNITY)
Admission: RE | Admit: 2022-10-06 | Discharge: 2022-10-06 | Disposition: A | Discharge status: Home / Self Care | Payer: Medicare Other | Source: Ambulatory Visit | Attending: Nephrology | Admitting: Nephrology

## 2022-10-06 VITALS — BP 134/98 | HR 105 | Temp 97.7°F

## 2022-10-06 DIAGNOSIS — N184 Chronic kidney disease, stage 4 (severe): Secondary | ICD-10-CM | POA: Diagnosis not present

## 2022-10-06 DIAGNOSIS — D631 Anemia in chronic kidney disease: Secondary | ICD-10-CM | POA: Diagnosis not present

## 2022-10-06 LAB — RENAL FUNCTION PANEL
Albumin: 4.3 g/dL (ref 3.5–5.0)
Anion gap: 12 (ref 5–15)
BUN: 20 mg/dL (ref 8–23)
CO2: 22 mmol/L (ref 22–32)
Calcium: 9.9 mg/dL (ref 8.9–10.3)
Chloride: 101 mmol/L (ref 98–111)
Creatinine, Ser: 2.55 mg/dL — ABNORMAL HIGH (ref 0.61–1.24)
GFR, Estimated: 28 mL/min — ABNORMAL LOW (ref >60)
Glucose, Bld: 104 mg/dL — ABNORMAL HIGH (ref 70–99)
Phosphorus: 3.9 mg/dL (ref 2.5–4.6)
Potassium: 4.4 mmol/L (ref 3.5–5.1)
Sodium: 135 mmol/L (ref 135–145)

## 2022-10-06 LAB — POCT HEMOGLOBIN-HEMACUE: Hemoglobin: 13 g/dL (ref 13.0–17.0)

## 2022-10-06 MED ORDER — CLONIDINE HCL 0.1 MG PO TABS: 0.1000 mg | ORAL_TABLET | ORAL | Status: DC | PRN

## 2022-10-06 MED ORDER — DARBEPOETIN ALFA 60 MCG/0.3ML IJ SOSY
60.0000 ug | PREFILLED_SYRINGE | Freq: Once | INTRAMUSCULAR | Status: DC
  Filled 2022-10-06: qty 0.3

## 2022-10-06 NOTE — Addendum Note (Signed)
Encounter addended by: Baxter Hire, RN on: 10/06/2022 2:40 PM  Actions taken: Therapy plan modified

## 2022-10-06 NOTE — Progress Notes (Signed)
Diagnosis: Anemia in Chronic Kidney Disease  Provider:  Donato Heinz MD  Procedure: Injection  Aranesp (darbepoetin alfa) , Dose: 60 mcg, Site: subcutaneous, Number of injections: 0  Hgb 13.0. Injection not administered.  Post Care: Patient declined observation  Discharge: Condition: Good, Destination: Home . AVS provided to patient.   Performed by:  Binnie Kand, RN

## 2022-10-08 DIAGNOSIS — Z9889 Other specified postprocedural states: Secondary | ICD-10-CM | POA: Diagnosis not present

## 2022-10-08 DIAGNOSIS — E1122 Type 2 diabetes mellitus with diabetic chronic kidney disease: Secondary | ICD-10-CM | POA: Diagnosis not present

## 2022-10-08 DIAGNOSIS — N2581 Secondary hyperparathyroidism of renal origin: Secondary | ICD-10-CM | POA: Diagnosis not present

## 2022-10-08 DIAGNOSIS — I129 Hypertensive chronic kidney disease with stage 1 through stage 4 chronic kidney disease, or unspecified chronic kidney disease: Secondary | ICD-10-CM | POA: Diagnosis not present

## 2022-10-08 DIAGNOSIS — N184 Chronic kidney disease, stage 4 (severe): Secondary | ICD-10-CM | POA: Diagnosis not present

## 2022-10-08 DIAGNOSIS — D631 Anemia in chronic kidney disease: Secondary | ICD-10-CM | POA: Diagnosis not present

## 2022-10-08 DIAGNOSIS — J449 Chronic obstructive pulmonary disease, unspecified: Secondary | ICD-10-CM | POA: Diagnosis not present

## 2022-10-14 ENCOUNTER — Encounter (HOSPITAL_COMMUNITY): Payer: Self-pay | Admitting: Nephrology

## 2022-10-15 ENCOUNTER — Ambulatory Visit (HOSPITAL_COMMUNITY)
Admission: RE | Admit: 2022-10-15 | Discharge: 2022-10-15 | Disposition: A | Discharge status: Home / Self Care | Payer: Medicare Other | Source: Ambulatory Visit | Attending: Gastroenterology | Admitting: Gastroenterology

## 2022-10-15 ENCOUNTER — Encounter (HOSPITAL_COMMUNITY): Payer: Self-pay

## 2022-10-15 DIAGNOSIS — K5730 Diverticulosis of large intestine without perforation or abscess without bleeding: Secondary | ICD-10-CM | POA: Diagnosis not present

## 2022-10-15 DIAGNOSIS — Z8719 Personal history of other diseases of the digestive system: Secondary | ICD-10-CM | POA: Diagnosis not present

## 2022-10-15 DIAGNOSIS — K859 Acute pancreatitis without necrosis or infection, unspecified: Secondary | ICD-10-CM | POA: Diagnosis not present

## 2022-10-15 DIAGNOSIS — K863 Pseudocyst of pancreas: Secondary | ICD-10-CM | POA: Diagnosis not present

## 2022-10-15 DIAGNOSIS — I700 Atherosclerosis of aorta: Secondary | ICD-10-CM | POA: Diagnosis not present

## 2022-10-26 DIAGNOSIS — U071 COVID-19: Secondary | ICD-10-CM | POA: Diagnosis not present

## 2022-10-26 DIAGNOSIS — J449 Chronic obstructive pulmonary disease, unspecified: Secondary | ICD-10-CM | POA: Diagnosis not present

## 2022-10-27 NOTE — Progress Notes (Signed)
GI Office Note    Referring Provider: [name], MD Primary Care Physician:  [name], MD Primary Gastroenterologist: Elon Alas. Abbey Chatters, DO  Date:  10/28/2022  ID:  Mathew Lara, DOB 04-12-1961, MRN [id number]   Chief Complaint   Chief Complaint  Patient presents with   Follow-up   History of Present Illness  Mathew Lara is a 62 y.o. male with a history of hepatitis C, genotype 1b treated with [name] 2016 with SVR, anxiety, COPD, CKD stage III, HTN, HLD, end-stage renal disease on dialysis Monday Wednesday Friday, remote history of alcohol and polysubstance abuse, diabetes, and pancreatitis, presenting today for follow-up.   Prior CT in May 2022 with coarse liver incidentally found. Abdominal ultrasound with fatty liver, negative for mass. Though suspected gallbladder polyp/mass in the fundus and was referred to surgery. Laparoscopic cholecystectomy and liver biopsy in July 2022 with pathology consistent with chronic cholecystitis and cholesterolosis. Liver biopsy with chronic hepatitis minimal activity grade 1 bridging fibrosis stage III consistent with history of hepatitis C. Yearly ultrasounds recommended due to advanced fibrosis and risk of progression to cirrhosis.   Abdominal ultrasound 04/21/2022 with CBD measuring 7 mm, evidence of cholecystectomy. No intrahepatic biliary ductal dilation. Probable fatty infiltration of the liver unchanged.   He was seen inpatient at [facility] 05/28/2022 for abdominal pain, nausea, vomiting.  He had recently been discharged from [facility] 9/8.  He had multiple CT scans revealing acute pancreatitis.  His initial CT scan did not show any pseudocyst.  He had right upper quadrant ultrasound with dilated CBD and increased liver echogenicity with normal portal vein on Doppler.  Repeat ultrasound 10 days later with again dilated CBD but this time decreased from 10 mm to 7 mm.  Follow-up CT A/P 04/19/2022 with increased fluid around the  pancreas without evidence of pancreatic necrosis.  GI was consulted during this most recent admission due to concern for pseudocyst.  On his CT scan of abdomen pelvis 05/27/22 he had a fluid collection at the head and body of the pancreas consistent with pseudocyst formation with improvement in his pancreatitis.  No definitive acute on chronic pancreatitis.  Possible changes consistent with chronic constipation.  He was treated with supportive measures and given aggressive bowel regimen.  He was advised to have follow-up CT with pancreatic protocol in 8-12 weeks.  And to follow-up posthospitalization.   Admitted to [facility] 06/09/2022 through 06/12/2022.  Admitted due to clotted dialysis catheter he underwent exchange of his dialysis catheter on 10/6 and was advised to resume outpatient dialysis.  He is currently pending left arm AV fistula versus graft on 07/06/2022.  He had a recent hypotensive episode with ED visit 06/19/2022.  Last office visit 06/24/2022. Some constipation in the setting of narcotic use. Taking stool softeners and having more regular Bms. Had lost weight since being on dialysis. Denies diarrhea, melena, brbpr. Denied swelling, brbpr, shortness of breath. Taking senna daily and oral iron. Reflux controlled. CT pancreatic protocol to be done mid November. Continue PPI daily. Continue stool softener. Continue to avoids NSAIDs. Yearly ultrasound due to fatty liver.   CT A/P 10/15/2022 IMPRESSION: 1. Interval decrease in size of the pancreatic pseudocysts. No active inflammatory changes. 2. Colonic diverticulosis. No bowel obstruction. 3. No hydronephrosis or nephrolithiasis on either side. 4.  Aortic Atherosclerosis  Last labs 10/06/22: Hgb 13. Iron panel normal 09/08/22.   Today: Constipation: Regular. Not currently taking any stool softeners. Not taking percocet's much at all anymore and  pretty much down to tylenol now.  No melena, brbpr.   Still taking iron once daily and has blood  work checked ever 4 weeks.   GERD: Good appetite, no n/v, early satiety.  Has lost weight but is actively trying. On treadmill everyday for about 30 minutes. No NSAIDs.   No abdominal pain. Reports he was in the ICU for a month in 2023-06-24 and reports he died 3 times. No more dialysis and is off his insulin. BG have been stable. No chest pain or shortness of breath.   Current Outpatient Medications  Medication Sig Dispense Refill   acetaminophen (TYLENOL) 325 MG tablet Take 2 tablets (650 mg total) by mouth every 6 (six) hours as needed for mild pain (or Fever >/= 101). 120 tablet 0   amLODipine (NORVASC) 10 MG tablet Take 10 mg by mouth daily.     aspirin EC 81 MG tablet Take 1 tablet (81 mg total) by mouth daily with breakfast. 30 tablet 11   atorvastatin (LIPITOR) 80 MG tablet Take 1 tablet (80 mg total) by mouth daily. (Patient taking differently: Take 80 mg by mouth at bedtime.)     B Complex-C-Zn-Folic Acid (DIALYVITE [id number] WITH ZINC) 0.8 MG TABS Take 1 tablet by mouth daily.     ferrous fumarate (FERRETTS) 325 (106 Fe) MG TABS tablet Take 1 tablet by mouth daily.     insulin aspart (NOVOLOG) 100 UNIT/ML injection Inject 1-3 Units into the skin 3 (three) times daily with meals. (Patient taking differently: Inject 2-12 Units into the skin See admin instructions. Inject per sliding scale: If 201-250=2 units 251-300=4 units 301-350=6 units 351-400=8 units 401-450=10 units 451-500=12 units If reads high, give 14 units, recheck in 2 hours. Call MD if blood sugar is >400 or <10) 10 mL 11   insulin detemir (LEVEMIR) 100 UNIT/ML injection Inject 0.08 mLs (8 Units total) into the skin 2 (two) times daily. 10 mL 11   loratadine (CLARITIN) 10 MG tablet Take 1 tablet (10 mg total) by mouth daily. 30 tablet 1   midodrine (PROAMATINE) 10 MG tablet Take 1 tablet (10 mg total) by mouth 3 (three) times daily with meals. 90 tablet 3   Multiple Vitamin (MULTIVITAMIN) tablet Take 1 tablet by mouth daily.      oxyCODONE (ROXICODONE) 5 MG immediate release tablet Take 1 tablet (5 mg total) by mouth every 8 (eight) hours as needed for severe pain. 20 tablet 0   pantoprazole (PROTONIX) 40 MG tablet Take 1 tablet (40 mg total) by mouth 2 (two) times daily before a meal. 30 minutes before breakfast (Patient taking differently: Take 40 mg by mouth daily. 30 minutes before breakfast) 60 tablet 5   QUEtiapine (SEROQUEL) 25 MG tablet Take 1 tablet (25 mg total) by mouth at bedtime.     senna-docusate (SENOKOT-S) 8.6-50 MG tablet Take 2 tablets by mouth at bedtime. 60 tablet 3   sevelamer carbonate (RENVELA) 800 MG tablet Take 3 tablets (2,400 mg total) by mouth 3 (three) times daily with meals.     zolpidem (AMBIEN) 5 MG tablet Take 1 tablet (5 mg total) by mouth at bedtime as needed for sleep. 10 tablet 0   albuterol (PROVENTIL) (2.5 MG/3ML) 0.083% nebulizer solution Take 3 mLs (2.5 mg total) by nebulization every 4 (four) hours as needed for wheezing or shortness of breath. (Patient not taking: Reported on 06/24/2022) 75 mL 12   revefenacin (YUPELRI) 175 MCG/3ML nebulizer solution Take 3 mLs (175 mcg total) by nebulization daily. (  Patient not taking: Reported on 10/28/2022) 90 mL    simethicone (MYLICON) 80 MG chewable tablet Chew 1 tablet (80 mg total) by mouth 4 (four) times daily. (Patient not taking: Reported on 10/28/2022) 30 tablet 0   sodium chloride (OCEAN) 0.65 % SOLN nasal spray Place 1 spray into both nostrils as needed for congestion. (Patient not taking: Reported on 06/24/2022)  0   No current facility-administered medications for this visit.    Past Medical History:  Diagnosis Date   Alcohol abuse    Anxiety    Bronchitis    Chronic hip pain    CKD (chronic kidney disease) stage 3, GFR 30-59 ml/min (HCC)    COPD (chronic obstructive pulmonary disease) ([facility])    DKA (diabetic ketoacidosis) ([place]) 04/10/2022   Essential hypertension    Hepatitis C    Mixed hyperlipidemia    Sleep apnea      Past Surgical History:  Procedure Laterality Date   BALLOON DILATION N/A 07/06/2021   Procedure: BALLOON DILATION;  Surgeon: [name], DO;  Location: [facility];  Service: Endoscopy;  Laterality: N/A;   BIOPSY  07/06/2021   Procedure: BIOPSY;  Surgeon: [name], DO;  Location: [facility];  Service: Endoscopy;;   CATARACT EXTRACTION Right    CHOLECYSTECTOMY N/A 03/23/2021   Procedure: LAPAROSCOPIC CHOLECYSTECTOMY;  Surgeon: [name], MD;  Location: [facility];  Service: General;  Laterality: N/A;   COLONOSCOPY N/A 01/23/2014   SLF:The LEFT COLON IS EXTREMELY redundant/TWO RECTAL POLYPS REMOVED/SMALL VOLUME RECTAL BLEEDING MOST LIKELY DUE TO Small internal hemorrhoids. path with prolapse type polyp of benign-mucosa. no adenomas   COLONOSCOPY WITH PROPOFOL N/A 07/06/2021   non-bleeding internal hemorrhoids, one 8 mm polyp in sigmod s/p removal and clip placed. One 5 mm polyp at recto-sigmoid s/p removal. 3 year surveillance. Tubular adenomas   ESOPHAGOGASTRODUODENOSCOPY N/A 01/23/2014   [id number] ring at the gastroesophageal junction/Medium sized hiatal hernia/NDYSPEPSIA MOST LIKELY DUE TO GERD/MILD Non-erosive gastritis   ESOPHAGOGASTRODUODENOSCOPY N/A 04/18/2018   food impaction, small hiatal hernia   ESOPHAGOGASTRODUODENOSCOPY (EGD) WITH PROPOFOL N/A 07/06/2021   moderate Schatzki ring s/p dilation, gastritis s/p biopsy, normal duodenum. Negative H.pylori.   HEMORRHOID BANDING  01/23/2014   Procedure: HEMORRHOID BANDING;  Surgeon: Danie Binder, MD;  Location: [facility];  Service: Endoscopy;;   IR FLUORO GUIDE CV LINE LEFT  05/04/2022   IR FLUORO GUIDE CV LINE LEFT  05/13/2022   IR FLUORO GUIDE CV LINE LEFT  06/11/2022   IR US GUIDE VASC ACCESS LEFT  05/04/2022   JOINT REPLACEMENT Right 2017   LIVER BIOPSY N/A 03/23/2021   Procedure: LAPAROSCOPIC LIVER BIOPSY;  Surgeon: [name], MD;  Location: [facility];  Service: General;  Laterality: N/A;    MOUTH SURGERY     POLYPECTOMY  07/06/2021   Procedure: POLYPECTOMY;  Surgeon: [name], DO;  Location: [facility];  Service: Endoscopy;;   TOTAL HIP ARTHROPLASTY Right 06/04/2016   Procedure: RIGHT TOTAL HIP ARTHROPLASTY ANTERIOR APPROACH;  Surgeon: Mcarthur Rossetti, MD;  Location: [facility];  Service: Orthopedics;  Laterality: Right;   TOTAL HIP ARTHROPLASTY Left 03/02/2019   Procedure: LEFT TOTAL HIP ARTHROPLASTY ANTERIOR APPROACH;  Surgeon: Mcarthur Rossetti, MD;  Location: [facility];  Service: Orthopedics;  Laterality: Left;    Family History  Problem Relation Age of Onset   Cancer Mother    Colon cancer Neg Hx    Colon polyps Neg Hx  Allergies as of 10/28/2022   (No Known Allergies)    Social History   Socioeconomic History   Marital status: Single    Spouse name: Not on file   Number of children: 1   Years of education: 10   Highest education level: Not on file  Occupational History    Comment: [employer]  Tobacco Use   Smoking status: Former    Packs/day: 1.00    Years: 42.00    Total pack years: 42.00    Types: Cigarettes    Start date: 09/07/1971    Quit date: 05/28/2014    Years since quitting: 8.4    Passive exposure: Past   Smokeless tobacco: Never   Tobacco comments:    quit in November 2015 after smoking x 20 yrs.  Vaping Use   Vaping Use: Never used  Substance and Sexual Activity   Alcohol use: Not Currently    Alcohol/week: 0.0 standard drinks of alcohol   Drug use: No   Sexual activity: Not on file  Other Topics Concern   Not on file  Social History Narrative       patient consumes 4 cups of caffeine daily.      Patient stopped drinking years ago.   Social Determinants of Health   Financial Resource Strain: Not on file  Food Insecurity: No Food Insecurity (05/28/2022)   Hunger Vital Sign    Worried About Running Out of Food in the Last Year: Never true    Ran Out of Food in the Last Year: Never true  Transportation  Needs: No Transportation Needs (05/28/2022)   PRAPARE - Hydrologist (Medical): No    Lack of Transportation (Non-Medical): No  Physical Activity: Not on file  Stress: Not on file  Social Connections: Not on file     Review of Systems   Gen: Denies fever, chills, anorexia. Denies fatigue, weakness, weight loss.  CV: Denies chest pain, palpitations, syncope, peripheral edema, and claudication. Resp: Denies dyspnea at rest, cough, wheezing, coughing up blood, and pleurisy. GI: See HPI Derm: Denies rash, itching, dry skin Psych: Denies depression, anxiety, memory loss, confusion. No homicidal or suicidal ideation.  Heme: Denies bruising, bleeding, and enlarged lymph nodes.   Physical Exam   BP 134/79 (BP Location: Left Arm, Patient Position: Sitting, Cuff Size: Normal)   Pulse 94   Temp 97.6 F (36.4 C) (Temporal)   Ht '5\' 8"'$  (1.727 m)   Wt 256 lb 12.8 oz (116.5 kg)   SpO2 95%   BMI 39.05 kg/m   General:   Alert and oriented. No distress noted. Pleasant and cooperative.  Head:  Normocephalic and atraumatic. Eyes:  Conjuctiva clear without scleral icterus. Mouth:  Oral mucosa pink and moist. Good dentition. No lesions. Lungs:  Clear to auscultation bilaterally. No wheezes, rales, or rhonchi. No distress.  Heart:  S1, S2 present without murmurs appreciated.  Abdomen:  +BS, soft, non-tender and non-distended. No rebound or guarding. No HSM or masses noted. Rectal: deferred Msk:  Symmetrical without gross deformities. Normal posture. Extremities:  Without edema. Neurologic:  Alert and  oriented x4 Psych:  Alert and cooperative. Normal mood and affect.   Assessment  Mathew Lara is a 62 y.o. male with a history of hepatitis C, genotype 1b treated with [name] 2016 with SVR, anxiety, COPD, CKD stage III, HTN, HLD, end-stage renal disease on dialysis Monday Wednesday Friday, remote history of alcohol and polysubstance abuse, history of colon polyps  due for surveillance in 2025, diabetes, and pancreatitis presenting today for follow up.   Pancreatitis, pancreatic pseudocyst: Pancreatitis in August 2023 and readmission in September with evidence of pseudocyst. Previous episode suspected possible gallstones vs hypertriglyceridemia. Recent CT A/P 2/*/24 with decrease in size of pseudocyst. Currently asymptomatic. Continued to advise alcohol avoidance.   Constipation:  Evidence of constipation on CT scan September 2023. Likely in the setting of prior opioids. Currently only using otc stool softener as needed. Stopped senna. No abdominal pain.   Fatty liver, history of hepatitis C: Last [place] in August 2023 with CBD 89m and no biliary dilation and probable fatty infiltration of liver. Treated with [name] in 2016 with achieved SVR. CMP in September 2023 normal. Will be due for yearly [place] August 2024.   GERD: Controlled on daily PPI.   PLAN   Continue pantoprazole 40 mg once daily. Prescription updated.  GERD diet/lifestyle modifications Continue high-fiber diet Continue stool softener as needed Avoid NSAIDs Continue to avoid alcohol Due for surveillance colonoscopy in 2025. Follow-up in 6 months    CVenetia Night MSN, FNP-BC, AGACNP-BC [facility]

## 2022-10-28 ENCOUNTER — Ambulatory Visit (INDEPENDENT_AMBULATORY_CARE_PROVIDER_SITE_OTHER): Payer: Medicare Other | Admitting: Gastroenterology

## 2022-10-28 ENCOUNTER — Encounter: Payer: Self-pay | Admitting: Gastroenterology

## 2022-10-28 VITALS — BP 134/79 | HR 94 | Temp 97.6°F | Ht 68.0 in | Wt 256.8 lb

## 2022-10-28 DIAGNOSIS — K219 Gastro-esophageal reflux disease without esophagitis: Secondary | ICD-10-CM

## 2022-10-28 DIAGNOSIS — K760 Fatty (change of) liver, not elsewhere classified: Secondary | ICD-10-CM | POA: Diagnosis not present

## 2022-10-28 DIAGNOSIS — K863 Pseudocyst of pancreas: Secondary | ICD-10-CM | POA: Diagnosis not present

## 2022-10-28 DIAGNOSIS — K5900 Constipation, unspecified: Secondary | ICD-10-CM | POA: Diagnosis not present

## 2022-10-28 DIAGNOSIS — Z8619 Personal history of other infectious and parasitic diseases: Secondary | ICD-10-CM | POA: Diagnosis not present

## 2022-10-28 NOTE — Patient Instructions (Addendum)
It was a pleasure to see you again today!  I am very pleased to see how well you are doing.  Continue pantoprazole 40 mg once daily.  Please reach out if you need a refill.  Follow a GERD diet:  Avoid fried, fatty, greasy, spicy, citrus foods. Avoid caffeine and carbonated beverages. Avoid chocolate. Try eating 4-6 small meals a day rather than 3 large meals. Do not eat within 3 hours of laying down. Prop head of bed up on wood or bricks to create a 6 inch incline.  Continue high-fiber diet.  Avoid any NSAIDs (Aleve, Advil, ibuprofen, BC or Goody powders).   Given your history of pancreatitis and a strongly advised that you refrain from alcohol use.  We will plan to see him in 6 months, sooner if needed, please do not hesitate to reach out if you have any questions or any new concerns.  It was a pleasure to see you today. I want to create trusting relationships with patients. If you receive a survey regarding your visit,  I greatly appreciate you taking time to fill this out on paper or through your MyChart. I value your feedback.  Venetia Night, MSN, FNP-BC, AGACNP-BC [facility]

## 2022-10-30 MED ORDER — PANTOPRAZOLE SODIUM 40 MG PO TBEC: 40.0000 mg | DELAYED_RELEASE_TABLET | Freq: Every day | ORAL | 5 refills | Status: DC

## 2022-11-01 ENCOUNTER — Ambulatory Visit (HOSPITAL_COMMUNITY): Payer: Medicaid Other

## 2022-11-02 DIAGNOSIS — Z961 Presence of intraocular lens: Secondary | ICD-10-CM | POA: Diagnosis not present

## 2022-11-02 DIAGNOSIS — H2512 Age-related nuclear cataract, left eye: Secondary | ICD-10-CM | POA: Diagnosis not present

## 2022-11-02 DIAGNOSIS — H40023 Open angle with borderline findings, high risk, bilateral: Secondary | ICD-10-CM | POA: Diagnosis not present

## 2022-11-03 ENCOUNTER — Encounter (HOSPITAL_COMMUNITY)
Admission: RE | Admit: 2022-11-03 | Discharge: 2022-11-03 | Disposition: A | Discharge status: Home / Self Care | Payer: Medicare Other | Source: Ambulatory Visit | Attending: Nephrology | Admitting: Nephrology

## 2022-11-03 VITALS — BP 128/86 | HR 93 | Temp 97.5°F | Resp 17

## 2022-11-03 DIAGNOSIS — N184 Chronic kidney disease, stage 4 (severe): Secondary | ICD-10-CM | POA: Diagnosis not present

## 2022-11-03 DIAGNOSIS — D631 Anemia in chronic kidney disease: Secondary | ICD-10-CM | POA: Insufficient documentation

## 2022-11-03 LAB — RENAL FUNCTION PANEL
Albumin: 4.3 g/dL (ref 3.5–5.0)
Anion gap: 11 (ref 5–15)
BUN: 30 mg/dL — ABNORMAL HIGH (ref 8–23)
CO2: 23 mmol/L (ref 22–32)
Calcium: 9.4 mg/dL (ref 8.9–10.3)
Chloride: 101 mmol/L (ref 98–111)
Creatinine, Ser: 2.89 mg/dL — ABNORMAL HIGH (ref 0.61–1.24)
GFR, Estimated: 24 mL/min — ABNORMAL LOW (ref >60)
Glucose, Bld: 101 mg/dL — ABNORMAL HIGH (ref 70–99)
Phosphorus: 4.6 mg/dL (ref 2.5–4.6)
Potassium: 4 mmol/L (ref 3.5–5.1)
Sodium: 135 mmol/L (ref 135–145)

## 2022-11-03 LAB — FERRITIN: Ferritin: 730 ng/mL — ABNORMAL HIGH (ref 24–336)

## 2022-11-03 LAB — POCT HEMOGLOBIN-HEMACUE: Hemoglobin: 11.6 g/dL — ABNORMAL LOW (ref 13.0–17.0)

## 2022-11-03 LAB — IRON AND TIBC
Iron: 55 ug/dL (ref 45–182)
Saturation Ratios: 18 % (ref 17.9–39.5)
TIBC: 299 ug/dL (ref 250–450)
UIBC: 244 ug/dL

## 2022-11-03 MED ORDER — DARBEPOETIN ALFA 60 MCG/0.3ML IJ SOSY
60.0000 ug | PREFILLED_SYRINGE | Freq: Once | INTRAMUSCULAR | Status: AC
  Administered 2022-11-03: 60 ug via SUBCUTANEOUS
  Filled 2022-11-03: qty 0.3

## 2022-11-03 MED ORDER — CLONIDINE HCL 0.1 MG PO TABS: 0.1000 mg | ORAL_TABLET | ORAL | Status: DC | PRN

## 2022-11-23 DIAGNOSIS — H401131 Primary open-angle glaucoma, bilateral, mild stage: Secondary | ICD-10-CM | POA: Diagnosis not present

## 2022-11-23 DIAGNOSIS — Z961 Presence of intraocular lens: Secondary | ICD-10-CM | POA: Diagnosis not present

## 2022-11-23 DIAGNOSIS — H2512 Age-related nuclear cataract, left eye: Secondary | ICD-10-CM | POA: Diagnosis not present

## 2022-11-24 DIAGNOSIS — J449 Chronic obstructive pulmonary disease, unspecified: Secondary | ICD-10-CM | POA: Diagnosis not present

## 2022-11-24 DIAGNOSIS — U071 COVID-19: Secondary | ICD-10-CM | POA: Diagnosis not present

## 2022-11-26 DIAGNOSIS — H2512 Age-related nuclear cataract, left eye: Secondary | ICD-10-CM | POA: Diagnosis not present

## 2022-11-29 DIAGNOSIS — N184 Chronic kidney disease, stage 4 (severe): Secondary | ICD-10-CM | POA: Diagnosis not present

## 2022-11-29 DIAGNOSIS — E1122 Type 2 diabetes mellitus with diabetic chronic kidney disease: Secondary | ICD-10-CM | POA: Diagnosis not present

## 2022-11-29 DIAGNOSIS — I129 Hypertensive chronic kidney disease with stage 1 through stage 4 chronic kidney disease, or unspecified chronic kidney disease: Secondary | ICD-10-CM | POA: Diagnosis not present

## 2022-11-29 DIAGNOSIS — J449 Chronic obstructive pulmonary disease, unspecified: Secondary | ICD-10-CM | POA: Diagnosis not present

## 2022-11-29 DIAGNOSIS — D631 Anemia in chronic kidney disease: Secondary | ICD-10-CM | POA: Diagnosis not present

## 2022-11-29 DIAGNOSIS — N2581 Secondary hyperparathyroidism of renal origin: Secondary | ICD-10-CM | POA: Diagnosis not present

## 2022-11-29 DIAGNOSIS — Z9889 Other specified postprocedural states: Secondary | ICD-10-CM | POA: Diagnosis not present

## 2022-11-30 ENCOUNTER — Encounter (HOSPITAL_COMMUNITY)
Admission: RE | Admit: 2022-11-30 | Discharge: 2022-11-30 | Disposition: A | Discharge status: Home / Self Care | Payer: Medicare Other | Source: Ambulatory Visit | Attending: Nephrology | Admitting: Nephrology

## 2022-11-30 VITALS — BP 143/90 | HR 90 | Temp 98.0°F | Resp 20

## 2022-11-30 DIAGNOSIS — D631 Anemia in chronic kidney disease: Secondary | ICD-10-CM | POA: Diagnosis not present

## 2022-11-30 DIAGNOSIS — N184 Chronic kidney disease, stage 4 (severe): Secondary | ICD-10-CM | POA: Insufficient documentation

## 2022-11-30 LAB — RENAL FUNCTION PANEL
Albumin: 4.1 g/dL (ref 3.5–5.0)
Anion gap: 10 (ref 5–15)
BUN: 26 mg/dL — ABNORMAL HIGH (ref 8–23)
CO2: 22 mmol/L (ref 22–32)
Calcium: 9.3 mg/dL (ref 8.9–10.3)
Chloride: 104 mmol/L (ref 98–111)
Creatinine, Ser: 2.32 mg/dL — ABNORMAL HIGH (ref 0.61–1.24)
GFR, Estimated: 31 mL/min — ABNORMAL LOW (ref >60)
Glucose, Bld: 106 mg/dL — ABNORMAL HIGH (ref 70–99)
Phosphorus: 3.8 mg/dL (ref 2.5–4.6)
Potassium: 4 mmol/L (ref 3.5–5.1)
Sodium: 136 mmol/L (ref 135–145)

## 2022-11-30 LAB — POCT HEMOGLOBIN-HEMACUE: Hemoglobin: 10.1 g/dL — ABNORMAL LOW (ref 13.0–17.0)

## 2022-11-30 MED ORDER — DARBEPOETIN ALFA 60 MCG/0.3ML IJ SOSY
60.0000 ug | PREFILLED_SYRINGE | Freq: Once | INTRAMUSCULAR | Status: AC
  Administered 2022-11-30: 60 ug via SUBCUTANEOUS
  Filled 2022-11-30: qty 0.3

## 2022-11-30 NOTE — Progress Notes (Signed)
Diagnosis: Anemia in Chronic Kidney Disease  Provider:  Donato Heinz MD  Procedure: Injection  Aranesp (darbepoetin alfa) , Dose: 60 mcg, Site: subcutaneous, Number of injections: 1  Hgb 10.1. Administered in right arm.  Post Care: Patient declined observation  Discharge: Condition: Stable, Destination: Home . AVS Provided  Performed by:  Binnie Kand, RN

## 2022-12-01 ENCOUNTER — Encounter (HOSPITAL_COMMUNITY): Payer: Medicare Other

## 2022-12-02 DIAGNOSIS — H268 Other specified cataract: Secondary | ICD-10-CM | POA: Diagnosis not present

## 2022-12-02 DIAGNOSIS — H25812 Combined forms of age-related cataract, left eye: Secondary | ICD-10-CM | POA: Diagnosis not present

## 2022-12-25 DIAGNOSIS — U071 COVID-19: Secondary | ICD-10-CM | POA: Diagnosis not present

## 2022-12-25 DIAGNOSIS — J449 Chronic obstructive pulmonary disease, unspecified: Secondary | ICD-10-CM | POA: Diagnosis not present

## 2022-12-29 ENCOUNTER — Encounter (HOSPITAL_COMMUNITY)
Admission: RE | Admit: 2022-12-29 | Discharge: 2022-12-29 | Disposition: A | Discharge status: Home / Self Care | Payer: Medicare Other | Source: Ambulatory Visit | Attending: Nephrology | Admitting: Nephrology

## 2022-12-29 VITALS — BP 141/90 | HR 87 | Temp 98.2°F | Resp 20

## 2022-12-29 DIAGNOSIS — N184 Chronic kidney disease, stage 4 (severe): Secondary | ICD-10-CM | POA: Insufficient documentation

## 2022-12-29 DIAGNOSIS — D631 Anemia in chronic kidney disease: Secondary | ICD-10-CM | POA: Insufficient documentation

## 2022-12-29 LAB — RENAL FUNCTION PANEL
Albumin: 3.9 g/dL (ref 3.5–5.0)
Anion gap: 9 (ref 5–15)
BUN: 25 mg/dL — ABNORMAL HIGH (ref 8–23)
CO2: 25 mmol/L (ref 22–32)
Calcium: 9.6 mg/dL (ref 8.9–10.3)
Chloride: 103 mmol/L (ref 98–111)
Creatinine, Ser: 2.23 mg/dL — ABNORMAL HIGH (ref 0.61–1.24)
GFR, Estimated: 32 mL/min — ABNORMAL LOW
Glucose, Bld: 133 mg/dL — ABNORMAL HIGH (ref 70–99)
Phosphorus: 3.4 mg/dL (ref 2.5–4.6)
Potassium: 4.4 mmol/L (ref 3.5–5.1)
Sodium: 137 mmol/L (ref 135–145)

## 2022-12-29 LAB — IRON AND TIBC
Iron: 60 ug/dL (ref 45–182)
Saturation Ratios: 22 % (ref 17.9–39.5)
TIBC: 273 ug/dL (ref 250–450)
UIBC: 213 ug/dL

## 2022-12-29 LAB — POCT HEMOGLOBIN-HEMACUE: Hemoglobin: 8.8 g/dL — ABNORMAL LOW (ref 13.0–17.0)

## 2022-12-29 LAB — FERRITIN: Ferritin: 568 ng/mL — ABNORMAL HIGH (ref 24–336)

## 2022-12-29 MED ORDER — DARBEPOETIN ALFA 60 MCG/0.3ML IJ SOSY
60.0000 ug | PREFILLED_SYRINGE | Freq: Once | INTRAMUSCULAR | Status: AC
  Administered 2022-12-29: 60 ug via SUBCUTANEOUS
  Filled 2022-12-29: qty 0.3

## 2022-12-29 MED ORDER — CLONIDINE HCL 0.1 MG PO TABS: 0.1000 mg | ORAL_TABLET | ORAL | Status: DC | PRN

## 2022-12-29 NOTE — Progress Notes (Signed)
Diagnosis: Anemia in Chronic Kidney Disease  Provider:  Terrial Rhodes MD  Procedure: Injection  Aranesp (darbepoetin alfa) , Dose: 60 mcg, Site: subcutaneous, Number of injections: 1  Hgb 8.8. Administered in right arm.  Post Care: Patient declined observation  Discharge: Condition: Good, Destination: Home . AVS Declined  Performed by:  Wyvonne Lenz, RN

## 2023-01-19 DIAGNOSIS — G894 Chronic pain syndrome: Secondary | ICD-10-CM | POA: Diagnosis not present

## 2023-01-19 DIAGNOSIS — N184 Chronic kidney disease, stage 4 (severe): Secondary | ICD-10-CM | POA: Diagnosis not present

## 2023-01-19 DIAGNOSIS — I5032 Chronic diastolic (congestive) heart failure: Secondary | ICD-10-CM | POA: Diagnosis not present

## 2023-01-19 DIAGNOSIS — E1122 Type 2 diabetes mellitus with diabetic chronic kidney disease: Secondary | ICD-10-CM | POA: Diagnosis not present

## 2023-01-19 DIAGNOSIS — J449 Chronic obstructive pulmonary disease, unspecified: Secondary | ICD-10-CM | POA: Diagnosis not present

## 2023-01-19 DIAGNOSIS — I132 Hypertensive heart and chronic kidney disease with heart failure and with stage 5 chronic kidney disease, or end stage renal disease: Secondary | ICD-10-CM | POA: Diagnosis not present

## 2023-01-24 DIAGNOSIS — U071 COVID-19: Secondary | ICD-10-CM | POA: Diagnosis not present

## 2023-01-24 DIAGNOSIS — J449 Chronic obstructive pulmonary disease, unspecified: Secondary | ICD-10-CM | POA: Diagnosis not present

## 2023-01-26 ENCOUNTER — Encounter (HOSPITAL_COMMUNITY)
Admission: RE | Admit: 2023-01-26 | Discharge: 2023-01-26 | Disposition: A | Discharge status: Home / Self Care | Payer: Medicare Other | Source: Ambulatory Visit | Attending: Nephrology | Admitting: Nephrology

## 2023-01-26 VITALS — BP 120/92 | HR 97 | Temp 98.4°F | Resp 20

## 2023-01-26 DIAGNOSIS — N184 Chronic kidney disease, stage 4 (severe): Secondary | ICD-10-CM | POA: Insufficient documentation

## 2023-01-26 DIAGNOSIS — D631 Anemia in chronic kidney disease: Secondary | ICD-10-CM | POA: Diagnosis not present

## 2023-01-26 LAB — IRON AND TIBC
Iron: 49 ug/dL (ref 45–182)
Saturation Ratios: 17 % — ABNORMAL LOW (ref 17.9–39.5)
TIBC: 286 ug/dL (ref 250–450)
UIBC: 237 ug/dL

## 2023-01-26 LAB — FERRITIN: Ferritin: 606 ng/mL — ABNORMAL HIGH (ref 24–336)

## 2023-01-26 LAB — RENAL FUNCTION PANEL
Albumin: 3.9 g/dL (ref 3.5–5.0)
Anion gap: 11 (ref 5–15)
BUN: 27 mg/dL — ABNORMAL HIGH (ref 8–23)
CO2: 22 mmol/L (ref 22–32)
Calcium: 9.3 mg/dL (ref 8.9–10.3)
Chloride: 102 mmol/L (ref 98–111)
Creatinine, Ser: 2.2 mg/dL — ABNORMAL HIGH (ref 0.61–1.24)
GFR, Estimated: 33 mL/min — ABNORMAL LOW (ref >60)
Glucose, Bld: 121 mg/dL — ABNORMAL HIGH (ref 70–99)
Phosphorus: 3.2 mg/dL (ref 2.5–4.6)
Potassium: 3.9 mmol/L (ref 3.5–5.1)
Sodium: 135 mmol/L (ref 135–145)

## 2023-01-26 LAB — POCT HEMOGLOBIN-HEMACUE: Hemoglobin: 12.1 g/dL — ABNORMAL LOW (ref 13.0–17.0)

## 2023-01-26 MED ORDER — CLONIDINE HCL 0.1 MG PO TABS: 0.1000 mg | ORAL_TABLET | ORAL | Status: DC | PRN

## 2023-01-26 MED ORDER — DARBEPOETIN ALFA 60 MCG/0.3ML IJ SOSY: 60.0000 ug | PREFILLED_SYRINGE | Freq: Once | INTRAMUSCULAR | Status: DC

## 2023-01-26 NOTE — Progress Notes (Signed)
Diagnosis: Anemia in Chronic Kidney Disease  Provider:  Terrial Rhodes MD  Procedure: Injection  Aranesp (darbepoetin alfa) , Dose: 60 mg, Site: subcutaneous, Number of injections: 1    Hgb 12.1    Injection not administered    Post Care: Patient declined observation  Discharge: Condition: Good, Destination: Home . AVS Declined  Performed by:  Marlow Baars [place], RN

## 2023-01-26 NOTE — Addendum Note (Signed)
Encounter addended by: Koah Chisenhall E, RN on: 01/26/2023 3:21 PM  Actions taken: Therapy plan modified

## 2023-01-27 ENCOUNTER — Encounter: Payer: Self-pay | Admitting: Internal Medicine

## 2023-02-04 DIAGNOSIS — I132 Hypertensive heart and chronic kidney disease with heart failure and with stage 5 chronic kidney disease, or end stage renal disease: Secondary | ICD-10-CM | POA: Diagnosis not present

## 2023-02-04 DIAGNOSIS — I10 Essential (primary) hypertension: Secondary | ICD-10-CM | POA: Diagnosis not present

## 2023-02-04 DIAGNOSIS — E1159 Type 2 diabetes mellitus with other circulatory complications: Secondary | ICD-10-CM | POA: Diagnosis not present

## 2023-02-04 DIAGNOSIS — N186 End stage renal disease: Secondary | ICD-10-CM | POA: Diagnosis not present

## 2023-02-04 DIAGNOSIS — E1122 Type 2 diabetes mellitus with diabetic chronic kidney disease: Secondary | ICD-10-CM | POA: Diagnosis not present

## 2023-02-17 ENCOUNTER — Encounter: Payer: Self-pay | Admitting: Internal Medicine

## 2023-02-23 ENCOUNTER — Encounter (HOSPITAL_COMMUNITY)
Admission: RE | Admit: 2023-02-23 | Discharge: 2023-02-23 | Disposition: A | Discharge status: Home / Self Care | Payer: Medicare Other | Source: Ambulatory Visit | Attending: Nephrology | Admitting: Nephrology

## 2023-02-23 VITALS — BP 147/94 | HR 103 | Temp 97.5°F

## 2023-02-23 DIAGNOSIS — N184 Chronic kidney disease, stage 4 (severe): Secondary | ICD-10-CM | POA: Diagnosis not present

## 2023-02-23 DIAGNOSIS — D631 Anemia in chronic kidney disease: Secondary | ICD-10-CM | POA: Insufficient documentation

## 2023-02-23 LAB — RENAL FUNCTION PANEL
Albumin: 4 g/dL (ref 3.5–5.0)
Anion gap: 11 (ref 5–15)
BUN: 22 mg/dL (ref 8–23)
CO2: 21 mmol/L — ABNORMAL LOW (ref 22–32)
Calcium: 9.3 mg/dL (ref 8.9–10.3)
Chloride: 103 mmol/L (ref 98–111)
Creatinine, Ser: 2.36 mg/dL — ABNORMAL HIGH (ref 0.61–1.24)
GFR, Estimated: 30 mL/min — ABNORMAL LOW (ref >60)
Glucose, Bld: 120 mg/dL — ABNORMAL HIGH (ref 70–99)
Phosphorus: 3.5 mg/dL (ref 2.5–4.6)
Potassium: 4.3 mmol/L (ref 3.5–5.1)
Sodium: 135 mmol/L (ref 135–145)

## 2023-02-23 LAB — IRON AND TIBC
Iron: 63 ug/dL (ref 45–182)
Saturation Ratios: 22 % (ref 17.9–39.5)
TIBC: 290 ug/dL (ref 250–450)
UIBC: 227 ug/dL

## 2023-02-23 LAB — FERRITIN: Ferritin: 627 ng/mL — ABNORMAL HIGH (ref 24–336)

## 2023-02-23 MED ORDER — CLONIDINE HCL 0.1 MG PO TABS: 0.1000 mg | ORAL_TABLET | ORAL | Status: DC | PRN

## 2023-02-23 MED ORDER — DARBEPOETIN ALFA 60 MCG/0.3ML IJ SOSY: 60.0000 ug | PREFILLED_SYRINGE | Freq: Once | INTRAMUSCULAR | Status: DC

## 2023-02-23 NOTE — Progress Notes (Signed)
Hgb 12.6 - injection not needed

## 2023-02-24 DIAGNOSIS — J449 Chronic obstructive pulmonary disease, unspecified: Secondary | ICD-10-CM | POA: Diagnosis not present

## 2023-02-24 DIAGNOSIS — U071 COVID-19: Secondary | ICD-10-CM | POA: Diagnosis not present

## 2023-02-24 LAB — POCT HEMOGLOBIN-HEMACUE: Hemoglobin: 12.6 g/dL — ABNORMAL LOW (ref 13.0–17.0)

## 2023-03-02 DIAGNOSIS — N2581 Secondary hyperparathyroidism of renal origin: Secondary | ICD-10-CM | POA: Diagnosis not present

## 2023-03-02 DIAGNOSIS — D631 Anemia in chronic kidney disease: Secondary | ICD-10-CM | POA: Diagnosis not present

## 2023-03-02 DIAGNOSIS — I129 Hypertensive chronic kidney disease with stage 1 through stage 4 chronic kidney disease, or unspecified chronic kidney disease: Secondary | ICD-10-CM | POA: Diagnosis not present

## 2023-03-02 DIAGNOSIS — N184 Chronic kidney disease, stage 4 (severe): Secondary | ICD-10-CM | POA: Diagnosis not present

## 2023-03-02 DIAGNOSIS — E1122 Type 2 diabetes mellitus with diabetic chronic kidney disease: Secondary | ICD-10-CM | POA: Diagnosis not present

## 2023-03-02 DIAGNOSIS — R809 Proteinuria, unspecified: Secondary | ICD-10-CM | POA: Diagnosis not present

## 2023-03-02 DIAGNOSIS — J449 Chronic obstructive pulmonary disease, unspecified: Secondary | ICD-10-CM | POA: Diagnosis not present

## 2023-03-02 DIAGNOSIS — Z9889 Other specified postprocedural states: Secondary | ICD-10-CM | POA: Diagnosis not present

## 2023-03-03 ENCOUNTER — Other Ambulatory Visit: Payer: Self-pay

## 2023-03-08 ENCOUNTER — Ambulatory Visit: Payer: Medicare Other | Admitting: Gastroenterology

## 2023-03-14 DIAGNOSIS — G894 Chronic pain syndrome: Secondary | ICD-10-CM | POA: Diagnosis not present

## 2023-03-14 DIAGNOSIS — M533 Sacrococcygeal disorders, not elsewhere classified: Secondary | ICD-10-CM | POA: Diagnosis not present

## 2023-03-23 ENCOUNTER — Encounter (HOSPITAL_COMMUNITY)
Admission: RE | Admit: 2023-03-23 | Discharge: 2023-03-23 | Disposition: A | Discharge status: Home / Self Care | Payer: Medicare Other | Source: Ambulatory Visit | Attending: Nephrology | Admitting: Nephrology

## 2023-03-23 VITALS — BP 130/88 | Temp 97.6°F

## 2023-03-23 DIAGNOSIS — N184 Chronic kidney disease, stage 4 (severe): Secondary | ICD-10-CM | POA: Diagnosis not present

## 2023-03-23 DIAGNOSIS — D631 Anemia in chronic kidney disease: Secondary | ICD-10-CM | POA: Insufficient documentation

## 2023-03-23 LAB — RENAL FUNCTION PANEL
Albumin: 3.9 g/dL (ref 3.5–5.0)
Anion gap: 9 (ref 5–15)
BUN: 36 mg/dL — ABNORMAL HIGH (ref 8–23)
CO2: 21 mmol/L — ABNORMAL LOW (ref 22–32)
Calcium: 9.5 mg/dL (ref 8.9–10.3)
Chloride: 104 mmol/L (ref 98–111)
Creatinine, Ser: 2.88 mg/dL — ABNORMAL HIGH (ref 0.61–1.24)
GFR, Estimated: 24 mL/min — ABNORMAL LOW (ref >60)
Glucose, Bld: 89 mg/dL (ref 70–99)
Phosphorus: 4.2 mg/dL (ref 2.5–4.6)
Potassium: 5 mmol/L (ref 3.5–5.1)
Sodium: 134 mmol/L — ABNORMAL LOW (ref 135–145)

## 2023-03-23 LAB — POCT HEMOGLOBIN-HEMACUE: Hemoglobin: 13 g/dL (ref 13.0–17.0)

## 2023-03-23 MED ORDER — DARBEPOETIN ALFA 40 MCG/0.4ML IJ SOSY: 40.0000 ug | PREFILLED_SYRINGE | Freq: Once | INTRAMUSCULAR | Status: DC

## 2023-03-23 MED ORDER — CLONIDINE HCL 0.1 MG PO TABS: 0.1000 mg | ORAL_TABLET | ORAL | Status: DC | PRN

## 2023-03-23 NOTE — Addendum Note (Signed)
Encounter addended by: Earnie Larsson, [facility] on: 03/23/2023 2:51 PM  Actions taken: Order list changed

## 2023-03-23 NOTE — Progress Notes (Signed)
Hgb. 13.0 - injection not needed.

## 2023-03-26 DIAGNOSIS — U071 COVID-19: Secondary | ICD-10-CM | POA: Diagnosis not present

## 2023-03-26 DIAGNOSIS — J449 Chronic obstructive pulmonary disease, unspecified: Secondary | ICD-10-CM | POA: Diagnosis not present

## 2023-03-29 ENCOUNTER — Telehealth: Payer: Self-pay | Admitting: *Deleted

## 2023-03-29 ENCOUNTER — Other Ambulatory Visit: Payer: Self-pay | Admitting: Gastroenterology

## 2023-03-29 DIAGNOSIS — K219 Gastro-esophageal reflux disease without esophagitis: Secondary | ICD-10-CM

## 2023-03-29 NOTE — Progress Notes (Signed)
  Care Coordination   Note   03/29/2023 Name: Mathew Lara MRN: [id number] DOB: Jun 26, 1961  Mathew Lara is a 62 y.o. year old male who sees Assunta Found, MD for primary care. I reached out to [name] by phone today to offer care coordination services.  [name] was given information about Care Coordination services today including:   The Care Coordination services include support from the care team which includes your Nurse Coordinator, Clinical Social Worker, or Pharmacist.  The Care Coordination team is here to help remove barriers to the health concerns and goals most important to you. Care Coordination services are voluntary, and the patient may decline or stop services at any time by request to their care team member.   Care Coordination Consent Status: Patient agreed to services and verbal consent obtained.   Follow up plan:  Telephone appointment with care coordination team member scheduled for:  04/11/23  Encounter Outcome:  Pt. Scheduled  [facility] Coordination Care Guide  Direct Dial: [phone]

## 2023-03-30 DIAGNOSIS — R5383 Other fatigue: Secondary | ICD-10-CM | POA: Diagnosis not present

## 2023-03-30 DIAGNOSIS — M25551 Pain in right hip: Secondary | ICD-10-CM | POA: Diagnosis not present

## 2023-03-30 DIAGNOSIS — Z96643 Presence of artificial hip joint, bilateral: Secondary | ICD-10-CM | POA: Diagnosis not present

## 2023-03-30 DIAGNOSIS — M533 Sacrococcygeal disorders, not elsewhere classified: Secondary | ICD-10-CM | POA: Diagnosis not present

## 2023-03-30 DIAGNOSIS — Z79899 Other long term (current) drug therapy: Secondary | ICD-10-CM | POA: Diagnosis not present

## 2023-03-30 DIAGNOSIS — G8928 Other chronic postprocedural pain: Secondary | ICD-10-CM | POA: Diagnosis not present

## 2023-03-30 DIAGNOSIS — R030 Elevated blood-pressure reading, without diagnosis of hypertension: Secondary | ICD-10-CM | POA: Diagnosis not present

## 2023-03-30 DIAGNOSIS — M129 Arthropathy, unspecified: Secondary | ICD-10-CM | POA: Diagnosis not present

## 2023-03-30 DIAGNOSIS — M25552 Pain in left hip: Secondary | ICD-10-CM | POA: Diagnosis not present

## 2023-03-30 DIAGNOSIS — Z1159 Encounter for screening for other viral diseases: Secondary | ICD-10-CM | POA: Diagnosis not present

## 2023-03-30 DIAGNOSIS — G8929 Other chronic pain: Secondary | ICD-10-CM | POA: Diagnosis not present

## 2023-03-30 DIAGNOSIS — E559 Vitamin D deficiency, unspecified: Secondary | ICD-10-CM | POA: Diagnosis not present

## 2023-03-30 DIAGNOSIS — M5416 Radiculopathy, lumbar region: Secondary | ICD-10-CM | POA: Diagnosis not present

## 2023-04-05 DIAGNOSIS — M47816 Spondylosis without myelopathy or radiculopathy, lumbar region: Secondary | ICD-10-CM | POA: Diagnosis not present

## 2023-04-05 DIAGNOSIS — E559 Vitamin D deficiency, unspecified: Secondary | ICD-10-CM | POA: Diagnosis not present

## 2023-04-05 DIAGNOSIS — M25551 Pain in right hip: Secondary | ICD-10-CM | POA: Diagnosis not present

## 2023-04-05 DIAGNOSIS — M533 Sacrococcygeal disorders, not elsewhere classified: Secondary | ICD-10-CM | POA: Diagnosis not present

## 2023-04-05 DIAGNOSIS — M25552 Pain in left hip: Secondary | ICD-10-CM | POA: Diagnosis not present

## 2023-04-05 DIAGNOSIS — M5416 Radiculopathy, lumbar region: Secondary | ICD-10-CM | POA: Diagnosis not present

## 2023-04-05 DIAGNOSIS — K760 Fatty (change of) liver, not elsewhere classified: Secondary | ICD-10-CM | POA: Diagnosis not present

## 2023-04-05 DIAGNOSIS — Z79899 Other long term (current) drug therapy: Secondary | ICD-10-CM | POA: Diagnosis not present

## 2023-04-05 DIAGNOSIS — N184 Chronic kidney disease, stage 4 (severe): Secondary | ICD-10-CM | POA: Diagnosis not present

## 2023-04-05 DIAGNOSIS — R768 Other specified abnormal immunological findings in serum: Secondary | ICD-10-CM | POA: Diagnosis not present

## 2023-04-11 ENCOUNTER — Encounter: Payer: Self-pay | Admitting: *Deleted

## 2023-04-11 ENCOUNTER — Ambulatory Visit: Payer: Self-pay | Admitting: *Deleted

## 2023-04-11 NOTE — Patient Outreach (Signed)
Care Coordination   Initial Visit Note   04/11/2023 Name: Mathew Lara MRN: [id number] DOB: [birth year]  Mathew Lara is a 62 y.o. year old male who sees Mathew Found, MD for primary care. I spoke with  Mathew Lara by phone today.  What matters to the patients health and wellness today?  Continuing to manage blood pressure and blood sugar well    Goals Addressed             This Visit's Progress    Care Coordination Services (no follow-up needed)       Care Coordination Goals: Patient will follow-up with PCP and/or specialist(s) as recommended Schedule a routine F/U with PCP Patient will have his hemoglobin A1C checked every 3-6 months Patient will take medications as prescribed Patient will remain active with a goal of at least 150 minutes of moderate activity a week Patient will continue to check feet daily for sores, wounds, etc and will F/U with provider if any noted Patient will continue to check and record blood sugar 4 times per day and as needed and will call provider with any readings outside of recommended range Patient will continue to have yearly eye exams Patient will continue to monitor and record blood pressure daily and will reach out to PCP with any readings outside of recommended range Patient will continue to follow a modified carbohydrate diet with 30GM of carbohydrates per meal and up to 2 snacks per day with less than 15GM of carbohydrates.  Patient will continue to eat a low sodium/DASH diet Patient will reach out to [facility] Care Management Dept at [phone] with any care coordination or resource needs          SDOH assessments and interventions completed:  Yes  SDOH Interventions Today    Flowsheet Row Most Recent Value  SDOH Interventions   Food Insecurity Interventions Intervention Not Indicated  Housing Interventions Intervention Not Indicated  Transportation Interventions Intervention Not Indicated  Utilities Interventions  Intervention Not Indicated  Financial Strain Interventions Intervention Not Indicated  Physical Activity Interventions Intervention Not Indicated        Care Coordination Interventions:  Yes, provided  Interventions Today    Flowsheet Row Most Recent Value  Chronic Disease   Chronic disease during today's visit Diabetes, Hypertension (HTN), Chronic Kidney Disease/End Stage Renal Disease (ESRD)  General Interventions   General Interventions Discussed/Reviewed General Interventions Discussed, General Interventions Reviewed, Labs, Annual Foot Exam, Durable Medical Equipment (DME), Annual Eye Exam, Doctor Visits, Health Screening  Labs Hgb A1c every 6 months  Doctor Visits Discussed/Reviewed Doctor Visits Discussed, Doctor Visits Reviewed, Annual Wellness Visits, PCP, Specialist  Health Screening Colonoscopy, Prostate  Durable Medical Equipment (DME) Glucomoter, BP Cuff  [BP 124/78  BS 98. Averages close to 100. No readings less than 70 or above 200.]  PCP/Specialist Visits Compliance with follow-up visit  [advised to schedule a follow-up with PCP. Keep routine nephrology appointments and lab appts to follow anemia related to chronic kidney disease]  Exercise Interventions   Exercise Discussed/Reviewed Exercise Discussed, Exercise Reviewed, Physical Activity  Physical Activity Discussed/Reviewed Physical Activity Discussed, Physical Activity Reviewed, Home Exercise Program (HEP), Types of exercise  [walks for 45 minutes each day]  Education Interventions   Education Provided Provided Education  Provided Verbal Education On Nutrition, Foot Care, Eye Care, Labs, Blood Sugar Monitoring, Medication, When to see the doctor  Labs Reviewed Hgb A1c, Kidney Function  [07/08/22 A1C 6.0%. 02/23/23 Creatinine 2.36 GFR 30]  Nutrition Interventions  Nutrition Discussed/Reviewed Nutrition Discussed, Nutrition Reviewed, Fluid intake, Carbohydrate meal planning, Increasing proteins  [currently eats green leafy  vegetables and lean proteins, like Chicken. Advised to talk to nephrologist about whether he has fluid restrictions. Recommended 30GM of carbohydrates with meals and up to 2 snacks per day with less than 15GM of carbohydrates]  Pharmacy Interventions   Pharmacy Dicussed/Reviewed Pharmacy Topics Discussed, Pharmacy Topics Reviewed, Medications and their functions  [Takes medication regularly. No questions or concerns.]  Safety Interventions   Safety Discussed/Reviewed Safety Discussed, Safety Reviewed, Home Safety, Fall Risk       Follow up plan: No further intervention required.   Encounter Outcome:  Pt. Visit Completed   Mathew Lara, BSN, RN-BC RN Care Coordinator [facility]  [facility] Direct Dial: [phone] Main #: [phone]

## 2023-04-14 ENCOUNTER — Telehealth: Payer: Self-pay | Admitting: Pharmacy Technician

## 2023-04-14 NOTE — Telephone Encounter (Signed)
Auth Submission: NO AUTH NEEDED Site of care: Site of care: [facility] Payer: [facility] MEDICARE Medication & CPT/J Code(s) submitted: MWUXLKG M0102 Route of submission (phone, fax, portal):  Phone # Fax # Auth type: Buy/Bill PB Units/visits requested: Q4WKS Reference number: [id number] Approval from: 04/14/23 to 09/06/23

## 2023-04-20 ENCOUNTER — Encounter (HOSPITAL_COMMUNITY): Admission: RE | Admit: 2023-04-20 | Payer: Medicare Other | Source: Ambulatory Visit

## 2023-04-20 ENCOUNTER — Encounter (HOSPITAL_COMMUNITY): Payer: Medicare Other | Attending: Nephrology | Admitting: *Deleted

## 2023-04-20 VITALS — BP 134/90 | HR 92 | Temp 98.3°F | Resp 18

## 2023-04-20 DIAGNOSIS — D631 Anemia in chronic kidney disease: Secondary | ICD-10-CM | POA: Diagnosis not present

## 2023-04-20 DIAGNOSIS — N184 Chronic kidney disease, stage 4 (severe): Secondary | ICD-10-CM | POA: Insufficient documentation

## 2023-04-20 MED ORDER — DARBEPOETIN ALFA 40 MCG/0.4ML IJ SOSY: 40.0000 ug | PREFILLED_SYRINGE | Freq: Once | INTRAMUSCULAR | Status: DC

## 2023-04-20 NOTE — Progress Notes (Signed)
Hgb. 13.3 no injection needed.

## 2023-04-20 NOTE — Addendum Note (Signed)
Addended by: Tad Moore on: 04/20/2023 01:59 PM   Modules accepted: Orders

## 2023-04-21 LAB — RENAL FUNCTION PANEL
Albumin: 4.5 g/dL (ref 3.6–5.1)
BUN/Creatinine Ratio: 20 (calc) (ref 6–22)
BUN: 61 mg/dL — ABNORMAL HIGH (ref 7–25)
CO2: 15 mmol/L — ABNORMAL LOW (ref 20–32)
Calcium: 9.7 mg/dL (ref 8.6–10.3)
Chloride: 105 mmol/L (ref 98–110)
Creat: 3.03 mg/dL — ABNORMAL HIGH (ref 0.70–1.35)
Glucose, Bld: 113 mg/dL — ABNORMAL HIGH (ref 65–99)
Phosphorus: 3.8 mg/dL (ref 2.5–4.5)
Potassium: 5.8 mmol/L — ABNORMAL HIGH (ref 3.5–5.3)
Sodium: 135 mmol/L (ref 135–146)

## 2023-04-21 LAB — IRON,TIBC AND FERRITIN PANEL
%SAT: 25 % (ref 20–48)
Ferritin: 696 ng/mL — ABNORMAL HIGH (ref 24–380)
Iron: 81 ug/dL (ref 50–180)
TIBC: 325 ug/dL (ref 250–425)

## 2023-04-26 DIAGNOSIS — U071 COVID-19: Secondary | ICD-10-CM | POA: Diagnosis not present

## 2023-04-26 DIAGNOSIS — J449 Chronic obstructive pulmonary disease, unspecified: Secondary | ICD-10-CM | POA: Diagnosis not present

## 2023-05-04 ENCOUNTER — Other Ambulatory Visit: Payer: Self-pay | Admitting: *Deleted

## 2023-05-04 ENCOUNTER — Encounter (INDEPENDENT_AMBULATORY_CARE_PROVIDER_SITE_OTHER): Payer: Medicare Other | Admitting: *Deleted

## 2023-05-04 VITALS — BP 129/97 | HR 89 | Temp 97.8°F | Resp 18

## 2023-05-04 DIAGNOSIS — N184 Chronic kidney disease, stage 4 (severe): Secondary | ICD-10-CM | POA: Diagnosis not present

## 2023-05-04 DIAGNOSIS — D631 Anemia in chronic kidney disease: Secondary | ICD-10-CM | POA: Diagnosis not present

## 2023-05-04 MED ORDER — DARBEPOETIN ALFA 40 MCG/0.4ML IJ SOSY: 40.0000 ug | PREFILLED_SYRINGE | Freq: Once | INTRAMUSCULAR | Status: DC

## 2023-05-04 MED ORDER — CLONIDINE HCL 0.1 MG PO TABS: 0.1000 mg | ORAL_TABLET | ORAL | Status: DC | PRN

## 2023-05-04 NOTE — Progress Notes (Signed)
Diagnosis: Anemia in Chronic Kidney Disease  Provider:  Terrial Rhodes MD  Procedure: Injection  Aranesp (darbepoetin alfa) , Dose: , Site: subcutaneous, Number of injections: 0  Hgb 13.8   Post Care: Patient declined observation  Discharge: Condition: Good, Destination: Home . AVS Provided  Performed by:  Forrest Moron, RN

## 2023-05-06 DIAGNOSIS — M25552 Pain in left hip: Secondary | ICD-10-CM | POA: Diagnosis not present

## 2023-05-06 DIAGNOSIS — R768 Other specified abnormal immunological findings in serum: Secondary | ICD-10-CM | POA: Diagnosis not present

## 2023-05-06 DIAGNOSIS — N184 Chronic kidney disease, stage 4 (severe): Secondary | ICD-10-CM | POA: Diagnosis not present

## 2023-05-06 DIAGNOSIS — M533 Sacrococcygeal disorders, not elsewhere classified: Secondary | ICD-10-CM | POA: Diagnosis not present

## 2023-05-06 DIAGNOSIS — M5416 Radiculopathy, lumbar region: Secondary | ICD-10-CM | POA: Diagnosis not present

## 2023-05-06 DIAGNOSIS — Z79899 Other long term (current) drug therapy: Secondary | ICD-10-CM | POA: Diagnosis not present

## 2023-05-06 DIAGNOSIS — M47816 Spondylosis without myelopathy or radiculopathy, lumbar region: Secondary | ICD-10-CM | POA: Diagnosis not present

## 2023-05-06 DIAGNOSIS — E559 Vitamin D deficiency, unspecified: Secondary | ICD-10-CM | POA: Diagnosis not present

## 2023-05-06 DIAGNOSIS — M25551 Pain in right hip: Secondary | ICD-10-CM | POA: Diagnosis not present

## 2023-05-10 ENCOUNTER — Ambulatory Visit (INDEPENDENT_AMBULATORY_CARE_PROVIDER_SITE_OTHER): Payer: Medicare Other | Admitting: Gastroenterology

## 2023-05-10 ENCOUNTER — Encounter: Payer: Self-pay | Admitting: Gastroenterology

## 2023-05-10 VITALS — BP 116/82 | HR 89 | Temp 97.5°F | Ht 68.0 in | Wt 283.6 lb

## 2023-05-10 DIAGNOSIS — K760 Fatty (change of) liver, not elsewhere classified: Secondary | ICD-10-CM

## 2023-05-10 NOTE — Patient Instructions (Signed)
Please have blood work done at [facility].  I have ordered a special ultrasound to assess the stiffness of your liver!  There is a new medication we may be able to start for you!  Further recommendations to follow!  I enjoyed seeing you again today! I value our relationship and want to provide genuine, compassionate, and quality care. You may receive a survey regarding your visit with me, and I welcome your feedback! Thanks so much for taking the time to complete this. I look forward to seeing you again.      Gelene Mink, PhD, ANP-BC [facility]

## 2023-05-10 NOTE — Progress Notes (Signed)
Gastroenterology Office Note     Primary Care Physician:  Assunta Found, MD  Primary Gastroenterologist: Dr. Marletta Lor    Chief Complaint   Chief Complaint  Patient presents with   Follow-up    Patient here today for a follow up on his fatty liver patient denies any current gi issues.     History of Present Illness   Mathew Lara is a 62 y.o. male presenting today with a history of hepatitis C, genotype 1b treated with [name] 2016 with SVR, anxiety, COPD, CKD stage III, HTN, HLD, end-stage renal disease on dialysis Monday Wednesday Friday, remote history of alcohol and polysubstance abuse, diabetes, pancreatitis suspected secondary to gallstones vs hypertriglyceridemia, gallbladder polyp s/p cholecystectomy, liver biopsy in July 2022 with path noting chronic hepatitis minimal activity (Grade 1) and bridging fibrosis (stage 3), presenting today for follow-up.  He has no concerns currently. Taking pantoprazole once daily. No dysphagia. GERD controlled. No abdominal pain, constipation, diarrhea. Doing well. He is interested in management of fatty liver. No alcohol use currently.     Colonoscopy Oct 2022 with non-bleeding internal hemorrhoids, tubular adenomas, 3 year surveillance recommended.    Past Medical History:  Diagnosis Date   Alcohol abuse    Anxiety    Bronchitis    Chronic hip pain    CKD (chronic kidney disease) stage 3, GFR 30-59 ml/min (HCC)    COPD (chronic obstructive pulmonary disease) (HCC)    DKA (diabetic ketoacidosis) (HCC) 04/10/2022   Essential hypertension    Hepatitis C    Mixed hyperlipidemia    Sleep apnea     Past Surgical History:  Procedure Laterality Date   BALLOON DILATION N/A 07/06/2021   Procedure: BALLOON DILATION;  Surgeon: Lanelle Bal, DO;  Location: [facility];  Service: Endoscopy;  Laterality: N/A;   BIOPSY  07/06/2021   Procedure: BIOPSY;  Surgeon: Lanelle Bal, DO;  Location: [facility];  Service: Endoscopy;;    CATARACT EXTRACTION Right    CHOLECYSTECTOMY N/A 03/23/2021   Procedure: LAPAROSCOPIC CHOLECYSTECTOMY;  Surgeon: Lucretia Roers, MD;  Location: [facility];  Service: General;  Laterality: N/A;   COLONOSCOPY N/A 01/23/2014   SLF:The LEFT COLON IS EXTREMELY redundant/TWO RECTAL POLYPS REMOVED/SMALL VOLUME RECTAL BLEEDING MOST LIKELY DUE TO Small internal hemorrhoids. path with prolapse type polyp of benign-mucosa. no adenomas   COLONOSCOPY WITH PROPOFOL N/A 07/06/2021   non-bleeding internal hemorrhoids, one 8 mm polyp in sigmod s/p removal and clip placed. One 5 mm polyp at recto-sigmoid s/p removal. 3 year surveillance. Tubular adenomas   ESOPHAGOGASTRODUODENOSCOPY N/A 01/23/2014   RUE:[id number] ring at the gastroesophageal junction/Medium sized hiatal hernia/NDYSPEPSIA MOST LIKELY DUE TO GERD/MILD Non-erosive gastritis   ESOPHAGOGASTRODUODENOSCOPY N/A 04/18/2018   food impaction, small hiatal hernia   ESOPHAGOGASTRODUODENOSCOPY (EGD) WITH PROPOFOL N/A 07/06/2021   moderate Schatzki ring s/p dilation, gastritis s/p biopsy, normal duodenum. Negative H.pylori.   HEMORRHOID BANDING  01/23/2014   Procedure: HEMORRHOID BANDING;  Surgeon: West Bali, MD;  Location: [facility];  Service: Endoscopy;;   IR FLUORO GUIDE CV LINE LEFT  05/04/2022   IR FLUORO GUIDE CV LINE LEFT  05/13/2022   IR FLUORO GUIDE CV LINE LEFT  06/11/2022   IR US GUIDE VASC ACCESS LEFT  05/04/2022   JOINT REPLACEMENT Right 2017   LIVER BIOPSY N/A 03/23/2021   Procedure: LAPAROSCOPIC LIVER BIOPSY;  Surgeon: Lucretia Roers, MD;  Location: [facility];  Service: General;  Laterality: N/A;   MOUTH SURGERY  POLYPECTOMY  07/06/2021   Procedure: POLYPECTOMY;  Surgeon: Lanelle Bal, DO;  Location: [facility];  Service: Endoscopy;;   TOTAL HIP ARTHROPLASTY Right 06/04/2016   Procedure: RIGHT TOTAL HIP ARTHROPLASTY ANTERIOR APPROACH;  Surgeon: Kathryne Hitch, MD;  Location: [facility];  Service: Orthopedics;   Laterality: Right;   TOTAL HIP ARTHROPLASTY Left 03/02/2019   Procedure: LEFT TOTAL HIP ARTHROPLASTY ANTERIOR APPROACH;  Surgeon: Kathryne Hitch, MD;  Location: [facility];  Service: Orthopedics;  Laterality: Left;    Current Outpatient Medications  Medication Sig Dispense Refill   acetaminophen (TYLENOL) 325 MG tablet Take 2 tablets (650 mg total) by mouth every 6 (six) hours as needed for mild pain (or Fever >/= 101). 120 tablet 0   amLODipine (NORVASC) 10 MG tablet Take 10 mg by mouth daily.     aspirin EC 81 MG tablet Take 1 tablet (81 mg total) by mouth daily with breakfast. 30 tablet 11   atorvastatin (LIPITOR) 80 MG tablet Take 1 tablet (80 mg total) by mouth daily. (Patient taking differently: Take 80 mg by mouth at bedtime.)     B Complex-C-Zn-Folic Acid (DIALYVITE 800 WITH ZINC) 0.8 MG TABS Take 1 tablet by mouth daily.     ferrous fumarate (FERRETTS) 325 (106 Fe) MG TABS tablet Take 1 tablet by mouth daily.     HYDROcodone-acetaminophen (NORCO/VICODIN) 5-325 MG tablet Take 1 tablet by mouth 3 (three) times daily as needed.     insulin aspart (NOVOLOG) 100 UNIT/ML injection Inject 1-3 Units into the skin 3 (three) times daily with meals. (Patient taking differently: Inject 2-12 Units into the skin See admin instructions. Inject per sliding scale: If 201-250=2 units 251-300=4 units 301-350=6 units 351-400=8 units 401-450=10 units 451-500=12 units If reads high, give 14 units, recheck in 2 hours. Call MD if blood sugar is >400 or <10) 10 mL 11   insulin detemir (LEVEMIR) 100 UNIT/ML injection Inject 0.08 mLs (8 Units total) into the skin 2 (two) times daily. 10 mL 11   loratadine (CLARITIN) 10 MG tablet Take 1 tablet (10 mg total) by mouth daily. 30 tablet 1   midodrine (PROAMATINE) 10 MG tablet Take 1 tablet (10 mg total) by mouth 3 (three) times daily with meals. 90 tablet 3   Multiple Vitamin (MULTIVITAMIN) tablet Take 1 tablet by mouth daily.     oxyCODONE (ROXICODONE) 5 MG  immediate release tablet Take 1 tablet (5 mg total) by mouth every 8 (eight) hours as needed for severe pain. 20 tablet 0   sevelamer carbonate (RENVELA) 800 MG tablet Take 3 tablets (2,400 mg total) by mouth 3 (three) times daily with meals.     zolpidem (AMBIEN) 5 MG tablet Take 1 tablet (5 mg total) by mouth at bedtime as needed for sleep. 10 tablet 0   No current facility-administered medications for this visit.   Facility-Administered Medications Ordered in Other Visits  Medication Dose Route Frequency Provider Last Rate Last Admin   cloNIDine (CATAPRES) tablet 0.1 mg  0.1 mg Oral PRN Terrial Rhodes, MD       Darbepoetin Alfa (ARANESP) injection 40 mcg  40 mcg Subcutaneous Once Terrial Rhodes, MD        Allergies as of 05/10/2023   (No Known Allergies)    Family History  Problem Relation Age of Onset   Cancer Mother    Colon cancer Neg Hx    Colon polyps Neg Hx     Social History   Socioeconomic History  Marital status: Single    Spouse name: Not on file   Number of children: 1   Years of education: 10   Highest education level: Not on file  Occupational History    Comment: [employer]  Tobacco Use   Smoking status: Former    Current packs/day: 0.00    Average packs/day: 1 pack/day for 42.7 years (42.7 ttl pk-yrs)    Types: Cigarettes    Start date: 09/07/1971    Quit date: 05/28/2014    Years since quitting: 8.9    Passive exposure: Past   Smokeless tobacco: Never   Tobacco comments:    quit in November 2015 after smoking x 20 yrs.  Vaping Use   Vaping status: Never Used  Substance and Sexual Activity   Alcohol use: Not Currently    Alcohol/week: 0.0 standard drinks of alcohol   Drug use: No   Sexual activity: Not on file  Other Topics Concern   Not on file  Social History Narrative       patient consumes 4 cups of caffeine daily.      Patient stopped drinking years ago.   Social Determinants of Health   Financial Resource Strain: Low Risk   (04/11/2023)   Overall Financial Resource Strain (CARDIA)    Difficulty of Paying Living Expenses: Not very hard  Food Insecurity: No Food Insecurity (04/11/2023)   Hunger Vital Sign    Worried About Running Out of Food in the Last Year: Never true    Ran Out of Food in the Last Year: Never true  Transportation Needs: No Transportation Needs (04/11/2023)   PRAPARE - Administrator, Civil Service (Medical): No    Lack of Transportation (Non-Medical): No  Physical Activity: Sufficiently Active (04/11/2023)   Exercise Vital Sign    Days of Exercise per Week: 7 days    Minutes of Exercise per Session: 40 min  Stress: Not on file  Social Connections: Not on file  Intimate Partner Violence: Unknown (05/28/2022)   Humiliation, Afraid, Rape, and Kick questionnaire    Fear of Current or Ex-Partner: No    Emotionally Abused: No    Physically Abused: No    Sexually Abused: Not on file     Review of Systems   Gen: Denies any fever, chills, fatigue, weight loss, lack of appetite.  CV: Denies chest pain, heart palpitations, peripheral edema, syncope.  Resp: Denies shortness of breath at rest or with exertion. Denies wheezing or cough.  GI: Denies dysphagia or odynophagia. Denies jaundice, hematemesis, fecal incontinence. GU : Denies urinary burning, urinary frequency, urinary hesitancy MS: Denies joint pain, muscle weakness, cramps, or limitation of movement.  Derm: Denies rash, itching, dry skin Psych: Denies depression, anxiety, memory loss, and confusion Heme: Denies bruising, bleeding, and enlarged lymph nodes.   Physical Exam   BP 116/82 (BP Location: Left Arm, Patient Position: Sitting, Cuff Size: Large)   Pulse 89   Temp (!) 97.5 F (36.4 C) (Temporal)   Ht 5\' 8"  (1.727 m)   Wt 283 lb 9.6 oz (128.6 kg)   BMI 43.12 kg/m  General:   Alert and oriented. Pleasant and cooperative. Well-nourished and well-developed.  Head:  Normocephalic and atraumatic. Eyes:  Without  icterus Abdomen:  +BS, soft, non-tender and non-distended. No HSM noted. No guarding or rebound. No masses appreciated.  Rectal:  Deferred  Msk:  Symmetrical without gross deformities. Normal posture. Extremities:  Without edema. Neurologic:  Alert and  oriented x4;  grossly  normal neurologically. Skin:  Intact without significant lesions or rashes. Psych:  Alert and cooperative. Normal mood and affect.   Assessment   Mathew Lara is a 62 y.o. male presenting today with a history of hepatitis C, genotype 1b treated with [name] 2016 with SVR, anxiety, COPD, CKD stage III, HTN, HLD, end-stage renal disease on dialysis Monday Wednesday Friday, remote history of alcohol and polysubstance abuse, diabetes, pancreatitis suspected secondary to gallstones vs hypertriglyceridemia, gallbladder polyp s/p cholecystectomy, liver biopsy in July 2022 with path noting chronic hepatitis minimal activity (Grade 1) and bridging fibrosis (stage 3), presenting today for follow-up.  Hepatic steatosis: currently no alcohol. Will update labs and [place] with elastography. Need to research if would be an appropriate candidate for Rezdiffra to prevent further fibrosis progression.  GERD remains controlled on pantoprazole without alarm signs/symptoms.  Next colonoscopy due in 2025 with history of tubular adenomas.      PLAN    US abdomen complete with elastography, FIB-4 with reflex to ELF, HFP Continue PPI daily Further recs to follow   Gelene Mink, PhD, ANP-BC [facility]

## 2023-05-11 ENCOUNTER — Ambulatory Visit (HOSPITAL_COMMUNITY)
Admission: RE | Admit: 2023-05-11 | Discharge: 2023-05-11 | Disposition: A | Discharge status: Home / Self Care | Payer: Medicare Other | Source: Ambulatory Visit | Attending: Gastroenterology | Admitting: Gastroenterology

## 2023-05-11 ENCOUNTER — Encounter: Payer: Self-pay | Admitting: Nephrology

## 2023-05-11 DIAGNOSIS — K760 Fatty (change of) liver, not elsewhere classified: Secondary | ICD-10-CM | POA: Insufficient documentation

## 2023-05-17 LAB — COMPREHENSIVE METABOLIC PANEL
ALT: 34 IU/L (ref 0–44)
AST: 23 IU/L (ref 0–40)
Albumin: 4.5 g/dL (ref 3.9–4.9)
Alkaline Phosphatase: 153 IU/L — ABNORMAL HIGH (ref 44–121)
BUN/Creatinine Ratio: 12 (ref 10–24)
BUN: 31 mg/dL — ABNORMAL HIGH (ref 8–27)
Bilirubin Total: 0.5 mg/dL (ref 0.0–1.2)
CO2: 22 mmol/L (ref 20–29)
Calcium: 10.1 mg/dL (ref 8.6–10.2)
Chloride: 102 mmol/L (ref 96–106)
Creatinine, Ser: 2.61 mg/dL — ABNORMAL HIGH (ref 0.76–1.27)
Globulin, Total: 3.3 g/dL (ref 1.5–4.5)
Glucose: 98 mg/dL (ref 70–99)
Potassium: 5.6 mmol/L — ABNORMAL HIGH (ref 3.5–5.2)
Sodium: 139 mmol/L (ref 134–144)
Total Protein: 7.8 g/dL (ref 6.0–8.5)
eGFR: 27 mL/min/{1.73_m2} — ABNORMAL LOW (ref >59)

## 2023-05-17 LAB — FIB-4 W/REFLEX TO ELF
FIB-4 Index: 1.41 (ref 0.00–2.67)
Platelets: 176 10*3/uL (ref 150–450)

## 2023-05-17 LAB — ENHANCED LIVER FIBROSIS (ELF): ELF(TM) Score: 8.99

## 2023-05-18 ENCOUNTER — Other Ambulatory Visit: Payer: Self-pay | Admitting: Emergency Medicine

## 2023-05-18 ENCOUNTER — Telehealth: Payer: Self-pay | Admitting: Gastroenterology

## 2023-05-18 ENCOUNTER — Encounter (HOSPITAL_COMMUNITY): Payer: Medicare Other | Attending: Family Medicine | Admitting: Emergency Medicine

## 2023-05-18 DIAGNOSIS — N184 Chronic kidney disease, stage 4 (severe): Secondary | ICD-10-CM | POA: Insufficient documentation

## 2023-05-18 DIAGNOSIS — D631 Anemia in chronic kidney disease: Secondary | ICD-10-CM

## 2023-05-18 LAB — GLUCOSE HEMOCUE WAIVED: Hemoglobin: 13.1

## 2023-05-18 MED ORDER — DARBEPOETIN ALFA 40 MCG/0.4ML IJ SOSY: 40.0000 ug | PREFILLED_SYRINGE | Freq: Once | INTRAMUSCULAR | Status: DC

## 2023-05-18 NOTE — Telephone Encounter (Signed)
Patient had an ultrasound done last week and wanted to see if he could get the results.  [phone]

## 2023-05-18 NOTE — Progress Notes (Signed)
No injection needed, hemoglobin 13.1.

## 2023-05-19 LAB — RENAL FUNCTION PANEL
Albumin: 4.4 g/dL (ref 3.6–5.1)
BUN/Creatinine Ratio: 9 (calc) (ref 6–22)
BUN: 23 mg/dL (ref 7–25)
CO2: 23 mmol/L (ref 20–32)
Calcium: 10.1 mg/dL (ref 8.6–10.3)
Chloride: 102 mmol/L (ref 98–110)
Creat: 2.52 mg/dL — ABNORMAL HIGH (ref 0.70–1.35)
Glucose, Bld: 95 mg/dL (ref 65–99)
Phosphorus: 3.9 mg/dL (ref 2.5–4.5)
Potassium: 4.8 mmol/L (ref 3.5–5.3)
Sodium: 138 mmol/L (ref 135–146)

## 2023-05-24 DIAGNOSIS — M533 Sacrococcygeal disorders, not elsewhere classified: Secondary | ICD-10-CM | POA: Diagnosis not present

## 2023-05-24 DIAGNOSIS — M47816 Spondylosis without myelopathy or radiculopathy, lumbar region: Secondary | ICD-10-CM | POA: Diagnosis not present

## 2023-05-31 ENCOUNTER — Encounter: Payer: Self-pay | Admitting: Nephrology

## 2023-06-01 ENCOUNTER — Encounter: Payer: Medicare Other | Admitting: *Deleted

## 2023-06-01 VITALS — BP 120/86 | HR 100 | Temp 98.3°F | Resp 18

## 2023-06-01 DIAGNOSIS — D631 Anemia in chronic kidney disease: Secondary | ICD-10-CM | POA: Diagnosis not present

## 2023-06-01 DIAGNOSIS — N184 Chronic kidney disease, stage 4 (severe): Secondary | ICD-10-CM

## 2023-06-01 LAB — GLUCOSE HEMOCUE WAIVED: Hemoglobin: 12.7

## 2023-06-01 MED ORDER — DARBEPOETIN ALFA 40 MCG/0.4ML IJ SOSY: 40.0000 ug | PREFILLED_SYRINGE | Freq: Once | INTRAMUSCULAR | Status: DC

## 2023-06-01 NOTE — Addendum Note (Signed)
Addended by: Tad Moore on: 06/01/2023 01:49 PM   Modules accepted: Orders

## 2023-06-01 NOTE — Progress Notes (Signed)
Hgb. 12.7 no injection needed.

## 2023-06-05 DIAGNOSIS — G8929 Other chronic pain: Secondary | ICD-10-CM | POA: Diagnosis not present

## 2023-06-05 DIAGNOSIS — M25551 Pain in right hip: Secondary | ICD-10-CM | POA: Diagnosis not present

## 2023-06-05 DIAGNOSIS — R768 Other specified abnormal immunological findings in serum: Secondary | ICD-10-CM | POA: Diagnosis not present

## 2023-06-05 DIAGNOSIS — M533 Sacrococcygeal disorders, not elsewhere classified: Secondary | ICD-10-CM | POA: Diagnosis not present

## 2023-06-05 DIAGNOSIS — Z79899 Other long term (current) drug therapy: Secondary | ICD-10-CM | POA: Diagnosis not present

## 2023-06-05 DIAGNOSIS — E559 Vitamin D deficiency, unspecified: Secondary | ICD-10-CM | POA: Diagnosis not present

## 2023-06-05 DIAGNOSIS — M5416 Radiculopathy, lumbar region: Secondary | ICD-10-CM | POA: Diagnosis not present

## 2023-06-05 DIAGNOSIS — R5383 Other fatigue: Secondary | ICD-10-CM | POA: Diagnosis not present

## 2023-06-05 DIAGNOSIS — M25552 Pain in left hip: Secondary | ICD-10-CM | POA: Diagnosis not present

## 2023-06-05 DIAGNOSIS — M47816 Spondylosis without myelopathy or radiculopathy, lumbar region: Secondary | ICD-10-CM | POA: Diagnosis not present

## 2023-06-05 DIAGNOSIS — G8928 Other chronic postprocedural pain: Secondary | ICD-10-CM | POA: Diagnosis not present

## 2023-06-06 DIAGNOSIS — N2581 Secondary hyperparathyroidism of renal origin: Secondary | ICD-10-CM | POA: Diagnosis not present

## 2023-06-06 DIAGNOSIS — D631 Anemia in chronic kidney disease: Secondary | ICD-10-CM | POA: Diagnosis not present

## 2023-06-06 DIAGNOSIS — J449 Chronic obstructive pulmonary disease, unspecified: Secondary | ICD-10-CM | POA: Diagnosis not present

## 2023-06-06 DIAGNOSIS — E1122 Type 2 diabetes mellitus with diabetic chronic kidney disease: Secondary | ICD-10-CM | POA: Diagnosis not present

## 2023-06-06 DIAGNOSIS — I509 Heart failure, unspecified: Secondary | ICD-10-CM | POA: Diagnosis not present

## 2023-06-06 DIAGNOSIS — Z9889 Other specified postprocedural states: Secondary | ICD-10-CM | POA: Diagnosis not present

## 2023-06-06 DIAGNOSIS — E875 Hyperkalemia: Secondary | ICD-10-CM | POA: Diagnosis not present

## 2023-06-06 DIAGNOSIS — R809 Proteinuria, unspecified: Secondary | ICD-10-CM | POA: Diagnosis not present

## 2023-06-06 DIAGNOSIS — E872 Acidosis, unspecified: Secondary | ICD-10-CM | POA: Diagnosis not present

## 2023-06-06 DIAGNOSIS — N184 Chronic kidney disease, stage 4 (severe): Secondary | ICD-10-CM | POA: Diagnosis not present

## 2023-06-06 DIAGNOSIS — I129 Hypertensive chronic kidney disease with stage 1 through stage 4 chronic kidney disease, or unspecified chronic kidney disease: Secondary | ICD-10-CM | POA: Diagnosis not present

## 2023-06-07 DIAGNOSIS — Z79899 Other long term (current) drug therapy: Secondary | ICD-10-CM | POA: Diagnosis not present

## 2023-06-14 ENCOUNTER — Encounter: Payer: Self-pay | Admitting: Nephrology

## 2023-06-14 ENCOUNTER — Telehealth: Payer: Self-pay

## 2023-06-14 NOTE — Telephone Encounter (Signed)
Auth Submission: NO AUTH NEEDED Site of care: Site of care: [facility] Payer: [employer] medicare Medication & CPT/J Code(s) submitted: Aranesp [id number] Route of submission (phone, fax, portal): portal Phone # Fax # Auth type: Buy/Bill PB Units/visits requested: , q4weeks Reference number: [id number]  Approval from: 06/14/23 to 09/06/23

## 2023-06-20 ENCOUNTER — Other Ambulatory Visit: Payer: Self-pay | Admitting: Gastroenterology

## 2023-06-20 DIAGNOSIS — K760 Fatty (change of) liver, not elsewhere classified: Secondary | ICD-10-CM

## 2023-06-22 ENCOUNTER — Encounter: Payer: Self-pay | Admitting: Nephrology

## 2023-06-29 ENCOUNTER — Ambulatory Visit: Payer: Medicare Other

## 2023-06-29 DIAGNOSIS — N184 Chronic kidney disease, stage 4 (severe): Secondary | ICD-10-CM | POA: Diagnosis not present

## 2023-06-29 DIAGNOSIS — G8929 Other chronic pain: Secondary | ICD-10-CM | POA: Diagnosis not present

## 2023-06-29 DIAGNOSIS — M25552 Pain in left hip: Secondary | ICD-10-CM | POA: Diagnosis not present

## 2023-06-29 DIAGNOSIS — M25551 Pain in right hip: Secondary | ICD-10-CM | POA: Diagnosis not present

## 2023-06-29 DIAGNOSIS — M533 Sacrococcygeal disorders, not elsewhere classified: Secondary | ICD-10-CM | POA: Diagnosis not present

## 2023-06-29 DIAGNOSIS — M5416 Radiculopathy, lumbar region: Secondary | ICD-10-CM | POA: Diagnosis not present

## 2023-06-29 DIAGNOSIS — M47816 Spondylosis without myelopathy or radiculopathy, lumbar region: Secondary | ICD-10-CM | POA: Diagnosis not present

## 2023-06-29 DIAGNOSIS — I10 Essential (primary) hypertension: Secondary | ICD-10-CM | POA: Diagnosis not present

## 2023-06-29 DIAGNOSIS — R6889 Other general symptoms and signs: Secondary | ICD-10-CM | POA: Diagnosis not present

## 2023-06-29 DIAGNOSIS — G8928 Other chronic postprocedural pain: Secondary | ICD-10-CM | POA: Diagnosis not present

## 2023-06-29 DIAGNOSIS — Z79899 Other long term (current) drug therapy: Secondary | ICD-10-CM | POA: Diagnosis not present

## 2023-07-04 DIAGNOSIS — R6889 Other general symptoms and signs: Secondary | ICD-10-CM | POA: Diagnosis not present

## 2023-07-08 DIAGNOSIS — G894 Chronic pain syndrome: Secondary | ICD-10-CM | POA: Diagnosis not present

## 2023-07-08 DIAGNOSIS — I10 Essential (primary) hypertension: Secondary | ICD-10-CM | POA: Diagnosis not present

## 2023-07-08 DIAGNOSIS — E6609 Other obesity due to excess calories: Secondary | ICD-10-CM | POA: Diagnosis not present

## 2023-07-08 DIAGNOSIS — N184 Chronic kidney disease, stage 4 (severe): Secondary | ICD-10-CM | POA: Diagnosis not present

## 2023-07-08 DIAGNOSIS — R7309 Other abnormal glucose: Secondary | ICD-10-CM | POA: Diagnosis not present

## 2023-07-08 DIAGNOSIS — Z6841 Body Mass Index (BMI) 40.0 and over, adult: Secondary | ICD-10-CM | POA: Diagnosis not present

## 2023-07-08 DIAGNOSIS — G4733 Obstructive sleep apnea (adult) (pediatric): Secondary | ICD-10-CM | POA: Diagnosis not present

## 2023-07-29 DIAGNOSIS — E790 Hyperuricemia without signs of inflammatory arthritis and tophaceous disease: Secondary | ICD-10-CM | POA: Diagnosis not present

## 2023-07-29 DIAGNOSIS — E119 Type 2 diabetes mellitus without complications: Secondary | ICD-10-CM | POA: Diagnosis not present

## 2023-07-29 DIAGNOSIS — Z79899 Other long term (current) drug therapy: Secondary | ICD-10-CM | POA: Diagnosis not present

## 2023-07-29 DIAGNOSIS — B182 Chronic viral hepatitis C: Secondary | ICD-10-CM | POA: Diagnosis not present

## 2023-07-29 DIAGNOSIS — M47816 Spondylosis without myelopathy or radiculopathy, lumbar region: Secondary | ICD-10-CM | POA: Diagnosis not present

## 2023-07-29 DIAGNOSIS — R030 Elevated blood-pressure reading, without diagnosis of hypertension: Secondary | ICD-10-CM | POA: Diagnosis not present

## 2023-07-29 DIAGNOSIS — Z6841 Body Mass Index (BMI) 40.0 and over, adult: Secondary | ICD-10-CM | POA: Diagnosis not present

## 2023-07-29 DIAGNOSIS — Z794 Long term (current) use of insulin: Secondary | ICD-10-CM | POA: Diagnosis not present

## 2023-07-29 DIAGNOSIS — M1990 Unspecified osteoarthritis, unspecified site: Secondary | ICD-10-CM | POA: Diagnosis not present

## 2023-07-29 DIAGNOSIS — R6889 Other general symptoms and signs: Secondary | ICD-10-CM | POA: Diagnosis not present

## 2023-08-10 DIAGNOSIS — B182 Chronic viral hepatitis C: Secondary | ICD-10-CM | POA: Diagnosis not present

## 2023-08-26 DIAGNOSIS — R6889 Other general symptoms and signs: Secondary | ICD-10-CM | POA: Diagnosis not present

## 2023-08-26 DIAGNOSIS — M25551 Pain in right hip: Secondary | ICD-10-CM | POA: Diagnosis not present

## 2023-08-26 DIAGNOSIS — M5416 Radiculopathy, lumbar region: Secondary | ICD-10-CM | POA: Diagnosis not present

## 2023-08-26 DIAGNOSIS — M47816 Spondylosis without myelopathy or radiculopathy, lumbar region: Secondary | ICD-10-CM | POA: Diagnosis not present

## 2023-08-26 DIAGNOSIS — M533 Sacrococcygeal disorders, not elsewhere classified: Secondary | ICD-10-CM | POA: Diagnosis not present

## 2023-08-26 DIAGNOSIS — G8929 Other chronic pain: Secondary | ICD-10-CM | POA: Diagnosis not present

## 2023-08-26 DIAGNOSIS — M25552 Pain in left hip: Secondary | ICD-10-CM | POA: Diagnosis not present

## 2023-08-26 DIAGNOSIS — Z6841 Body Mass Index (BMI) 40.0 and over, adult: Secondary | ICD-10-CM | POA: Diagnosis not present

## 2023-08-26 DIAGNOSIS — Z79899 Other long term (current) drug therapy: Secondary | ICD-10-CM | POA: Diagnosis not present

## 2023-09-06 ENCOUNTER — Encounter: Payer: Self-pay | Admitting: Nephrology

## 2023-09-13 ENCOUNTER — Encounter: Payer: Medicare Other | Admitting: Internal Medicine

## 2023-09-13 DIAGNOSIS — Z9889 Other specified postprocedural states: Secondary | ICD-10-CM | POA: Diagnosis not present

## 2023-09-13 DIAGNOSIS — I129 Hypertensive chronic kidney disease with stage 1 through stage 4 chronic kidney disease, or unspecified chronic kidney disease: Secondary | ICD-10-CM | POA: Diagnosis not present

## 2023-09-13 DIAGNOSIS — E1122 Type 2 diabetes mellitus with diabetic chronic kidney disease: Secondary | ICD-10-CM | POA: Diagnosis not present

## 2023-09-13 DIAGNOSIS — D631 Anemia in chronic kidney disease: Secondary | ICD-10-CM | POA: Diagnosis not present

## 2023-09-13 DIAGNOSIS — N2581 Secondary hyperparathyroidism of renal origin: Secondary | ICD-10-CM | POA: Diagnosis not present

## 2023-09-13 DIAGNOSIS — R809 Proteinuria, unspecified: Secondary | ICD-10-CM | POA: Diagnosis not present

## 2023-09-13 DIAGNOSIS — E1129 Type 2 diabetes mellitus with other diabetic kidney complication: Secondary | ICD-10-CM | POA: Diagnosis not present

## 2023-09-13 DIAGNOSIS — J449 Chronic obstructive pulmonary disease, unspecified: Secondary | ICD-10-CM | POA: Diagnosis not present

## 2023-09-13 DIAGNOSIS — N184 Chronic kidney disease, stage 4 (severe): Secondary | ICD-10-CM | POA: Diagnosis not present

## 2023-09-18 ENCOUNTER — Other Ambulatory Visit: Payer: Self-pay | Admitting: Gastroenterology

## 2023-09-18 DIAGNOSIS — K760 Fatty (change of) liver, not elsewhere classified: Secondary | ICD-10-CM

## 2023-09-26 DIAGNOSIS — I10 Essential (primary) hypertension: Secondary | ICD-10-CM | POA: Diagnosis not present

## 2023-09-26 DIAGNOSIS — M47816 Spondylosis without myelopathy or radiculopathy, lumbar region: Secondary | ICD-10-CM | POA: Diagnosis not present

## 2023-09-26 DIAGNOSIS — M5416 Radiculopathy, lumbar region: Secondary | ICD-10-CM | POA: Diagnosis not present

## 2023-09-26 DIAGNOSIS — Z6841 Body Mass Index (BMI) 40.0 and over, adult: Secondary | ICD-10-CM | POA: Diagnosis not present

## 2023-09-26 DIAGNOSIS — M25551 Pain in right hip: Secondary | ICD-10-CM | POA: Diagnosis not present

## 2023-09-26 DIAGNOSIS — Z79899 Other long term (current) drug therapy: Secondary | ICD-10-CM | POA: Diagnosis not present

## 2023-09-26 DIAGNOSIS — Z96643 Presence of artificial hip joint, bilateral: Secondary | ICD-10-CM | POA: Diagnosis not present

## 2023-09-26 DIAGNOSIS — G8929 Other chronic pain: Secondary | ICD-10-CM | POA: Diagnosis not present

## 2023-10-04 ENCOUNTER — Other Ambulatory Visit (HOSPITAL_COMMUNITY): Payer: Self-pay | Admitting: Nurse Practitioner

## 2023-10-04 DIAGNOSIS — K8689 Other specified diseases of pancreas: Secondary | ICD-10-CM

## 2023-10-13 ENCOUNTER — Other Ambulatory Visit: Payer: Self-pay | Admitting: *Deleted

## 2023-10-13 ENCOUNTER — Ambulatory Visit (INDEPENDENT_AMBULATORY_CARE_PROVIDER_SITE_OTHER): Payer: Medicare HMO | Admitting: Gastroenterology

## 2023-10-13 ENCOUNTER — Telehealth: Payer: Self-pay | Admitting: *Deleted

## 2023-10-13 ENCOUNTER — Encounter: Payer: Self-pay | Admitting: Gastroenterology

## 2023-10-13 VITALS — BP 143/96 | HR 102 | Temp 98.5°F | Ht 66.0 in | Wt 299.0 lb

## 2023-10-13 DIAGNOSIS — K760 Fatty (change of) liver, not elsewhere classified: Secondary | ICD-10-CM | POA: Diagnosis not present

## 2023-10-13 DIAGNOSIS — K869 Disease of pancreas, unspecified: Secondary | ICD-10-CM

## 2023-10-13 DIAGNOSIS — K863 Pseudocyst of pancreas: Secondary | ICD-10-CM | POA: Diagnosis not present

## 2023-10-13 NOTE — Patient Instructions (Signed)
 We are ordering the CT scan to assess stability.  I am looking at the studies for Rezdiffra prior to starting you on this, as currently it has not been studied in people like you with lower GFR (kidney filtration rate).   We will see you in 6 months!  I enjoyed seeing you again today! I value our relationship and want to provide genuine, compassionate, and quality care. You may receive a survey regarding your visit with me, and I welcome your feedback! Thanks so much for taking the time to complete this. I look forward to seeing you again.      Therisa MICAEL Stager, PhD, ANP-BC [facility]

## 2023-10-13 NOTE — Addendum Note (Signed)
 Addended by: Sheilda Deputy L on: 10/13/2023 10:00 AM   Modules accepted: Orders

## 2023-10-13 NOTE — Telephone Encounter (Signed)
 [employer] PA:  Approved Authorization #[id number]  Tracking #[id number] Dates of service 10/13/2023 - 12/12/2023

## 2023-10-13 NOTE — Progress Notes (Signed)
 Gastroenterology Office Note     Primary Care Physician:  Marvine Rush, MD  Primary Gastroenterologist: Dr. Cindie    Chief Complaint   Chief Complaint  Patient presents with   Follow-up     History of Present Illness   Mathew Lara is a 63 y.o. male presenting today with a history of  hepatitis C, genotype 1b treated with [name] 2016 with SVR, anxiety, COPD, CKD stage III, HTN, HLD, dialysis in the past but now none for 5-6 months,  remote history of alcohol and polysubstance abuse, diabetes, pancreatitis suspected secondary to gallstones vs hypertriglyceridemia, gallbladder polyp s/p cholecystectomy, liver biopsy in July 2022 with path noting chronic hepatitis minimal activity (Grade 1) and bridging fibrosis (stage 3)    Fibrosis: ELF score 8.99, FIB 4 indeterminate risk, elastography Sept 2024 with IQR ration 0.4, so reduced accuracy.   No alcohol. Exercise is difficult. Chronic back pain and seeing [facility]. Pantoprazole  once daily.   US  elastography in Sept 2024 with pancreatic hypoechoic structure. 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 He states his PCP was concerned about this and ordering a CT, but he didn't want to do until talking with us . No splenomegaly.    Colonoscopy Oct 2022 with non-bleeding internal hemorrhoids, tubular adenomas, 3 year surveillance recommended.     Past Medical History:  Diagnosis Date   Alcohol abuse    Anxiety    Bronchitis    Chronic hip pain    CKD (chronic kidney disease) stage 3, GFR 30-59 ml/min (HCC)    COPD (chronic obstructive pulmonary disease) (HCC)    DKA (diabetic ketoacidosis) (HCC) 04/10/2022   Essential hypertension    Hepatitis C    Mixed hyperlipidemia    Sleep apnea     Past Surgical History:  Procedure Laterality Date   BALLOON DILATION N/A 07/06/2021   Procedure: BALLOON DILATION;  Surgeon: Cindie Carlin POUR, DO;  [facility];  Service: Endoscopy;  Laterality: N/A;   BIOPSY   07/06/2021   Procedure: BIOPSY;  Surgeon: Cindie Carlin POUR, DO;  [facility];  Service: Endoscopy;;   CATARACT EXTRACTION Right    CHOLECYSTECTOMY N/A 03/23/2021   Procedure: LAPAROSCOPIC CHOLECYSTECTOMY;  Surgeon: Kallie Manuelita BROCKS, MD;  Location: [facility];  Service: General;  Laterality: N/A;   COLONOSCOPY N/A 01/23/2014   SLF:The LEFT COLON IS EXTREMELY redundant/TWO RECTAL POLYPS REMOVED/SMALL VOLUME RECTAL BLEEDING MOST LIKELY DUE TO Small internal hemorrhoids. path with prolapse type polyp of benign-mucosa. no adenomas   COLONOSCOPY WITH PROPOFOL  N/A 07/06/2021   non-bleeding internal hemorrhoids, one 8 mm polyp in sigmod s/p removal and clip placed. One 5 mm polyp at recto-sigmoid s/p removal. 3 year surveillance. Tubular adenomas   ESOPHAGOGASTRODUODENOSCOPY N/A 01/23/2014   DOQ:Dryjusxp ring at the gastroesophageal junction/Medium sized hiatal hernia/NDYSPEPSIA MOST LIKELY DUE TO GERD/MILD Non-erosive gastritis   ESOPHAGOGASTRODUODENOSCOPY N/A 04/18/2018   food impaction, small hiatal hernia   ESOPHAGOGASTRODUODENOSCOPY (EGD) WITH PROPOFOL  N/A 07/06/2021   moderate Schatzki ring s/p dilation, gastritis s/p biopsy, normal duodenum. Negative H.pylori.   HEMORRHOID BANDING  01/23/2014   Procedure: HEMORRHOID BANDING;  Surgeon: Margo LITTIE Haddock, MD;  [facility];  Service: Endoscopy;;   IR FLUORO GUIDE CV LINE LEFT  05/04/2022   IR FLUORO GUIDE CV LINE LEFT  05/13/2022   IR FLUORO GUIDE CV LINE LEFT  06/11/2022   IR US  GUIDE VASC ACCESS LEFT  05/04/2022   JOINT REPLACEMENT Right 2017   LIVER BIOPSY N/A 03/23/2021  Procedure: LAPAROSCOPIC LIVER BIOPSY;  Surgeon: Kallie Manuelita BROCKS, MD;  Location: [facility];  Service: General;  Laterality: N/A;   MOUTH SURGERY     POLYPECTOMY  07/06/2021   Procedure: POLYPECTOMY;  Surgeon: Cindie Carlin POUR, DO;  [facility];  Service: Endoscopy;;   TOTAL HIP ARTHROPLASTY Right 06/04/2016   Procedure: RIGHT TOTAL HIP  ARTHROPLASTY ANTERIOR APPROACH;  Surgeon: Lonni CINDERELLA Poli, MD;  Location: [facility];  Service: Orthopedics;  Laterality: Right;   TOTAL HIP ARTHROPLASTY Left 03/02/2019   Procedure: LEFT TOTAL HIP ARTHROPLASTY ANTERIOR APPROACH;  Surgeon: Poli Lonni CINDERELLA, MD;  Location: [facility];  Service: Orthopedics;  Laterality: Left;    Current Outpatient Medications  Medication Sig Dispense Refill   acetaminophen  (TYLENOL ) 325 MG tablet Take 2 tablets (650 mg total) by mouth every 6 (six) hours as needed for mild pain (or Fever >/= 101). 120 tablet 0   amLODipine  (NORVASC ) 10 MG tablet Take 10 mg by mouth daily.     aspirin  EC 81 MG tablet Take 1 tablet (81 mg total) by mouth daily with breakfast. 30 tablet 11   atorvastatin  (LIPITOR ) 80 MG tablet Take 1 tablet (80 mg total) by mouth daily. (Patient taking differently: Take 80 mg by mouth at bedtime.)     B Complex-C-Zn-Folic Acid  (DIALYVITE 800 WITH ZINC ) 0.8 MG TABS Take 1 tablet by mouth daily.     ferrous fumarate  (FERRETTS) 325 (106 Fe) MG TABS tablet Take 1 tablet by mouth daily.     HYDROcodone -acetaminophen  (NORCO/VICODIN) 5-325 MG tablet Take 1 tablet by mouth 3 (three) times daily as needed.     insulin  aspart (NOVOLOG ) 100 UNIT/ML injection Inject 1-3 Units into the skin 3 (three) times daily with meals. (Patient taking differently: Inject 2-12 Units into the skin See admin instructions. Inject per sliding scale: If 201-250=2 units 251-300=4 units 301-350=6 units 351-400=8 units 401-450=10 units 451-500=12 units If reads high, give 14 units, recheck in 2 hours. Call MD if blood sugar is >400 or <10) 10 mL 11   insulin  detemir (LEVEMIR ) 100 UNIT/ML injection Inject 0.08 mLs (8 Units total) into the skin 2 (two) times daily. 10 mL 11   KERENDIA 10 MG TABS Take 1 tablet by mouth daily.     loratadine  (CLARITIN ) 10 MG tablet Take 1 tablet (10 mg total) by mouth daily. 30 tablet 1   midodrine  (PROAMATINE ) 10 MG tablet Take 1 tablet (10  mg total) by mouth 3 (three) times daily with meals. 90 tablet 3   Multiple Vitamin (MULTIVITAMIN) tablet Take 1 tablet by mouth daily.     pantoprazole  (PROTONIX ) 40 MG tablet TAKE 1 TABLET BY MOUTH ONCE DAILY 30 MINUTES BEFORE BREAKFAST 90 tablet 0   sevelamer  carbonate (RENVELA ) 800 MG tablet Take 3 tablets (2,400 mg total) by mouth 3 (three) times daily with meals.     Vitamin D , Ergocalciferol , (DRISDOL) 1.25 MG (50000 UNIT) CAPS capsule Take 50,000 Units by mouth once a week.     zolpidem  (AMBIEN ) 5 MG tablet Take 1 tablet (5 mg total) by mouth at bedtime as needed for sleep. 10 tablet 0   No current facility-administered medications for this visit.    Allergies as of 10/13/2023   (No Known Allergies)    Family History  Problem Relation Age of Onset   Cancer Mother    Colon cancer Neg Hx    Colon polyps Neg Hx     Social History   Socioeconomic History   Marital  status: Single    Spouse name: Not on file   Number of children: 1   Years of education: 10   Highest education level: Not on file  Occupational History    Comment: [employer]  Tobacco Use   Smoking status: Former    Current packs/day: 0.00    Average packs/day: 1 pack/day for 42.7 years (42.7 ttl pk-yrs)    Types: Cigarettes    Start date: 09/07/1971    Quit date: 05/28/2014    Years since quitting: 9.3    Passive exposure: Past   Smokeless tobacco: Never   Tobacco comments:    quit in November 2015 after smoking x 20 yrs.  Vaping Use   Vaping status: Never Used  Substance and Sexual Activity   Alcohol use: Not Currently    Alcohol/week: 0.0 standard drinks of alcohol   Drug use: No   Sexual activity: Not on file  Other Topics Concern   Not on file  Social History Narrative       patient consumes 4 cups of caffeine daily.      Patient stopped drinking years ago.   Social Drivers of Corporate Investment Banker Strain: Low Risk  (04/11/2023)   Overall Financial Resource Strain (CARDIA)     Difficulty of Paying Living Expenses: Not very hard  Food Insecurity: No Food Insecurity (04/11/2023)   Hunger Vital Sign    Worried About Running Out of Food in the Last Year: Never true    Ran Out of Food in the Last Year: Never true  Transportation Needs: No Transportation Needs (04/11/2023)   PRAPARE - Administrator, Civil Service (Medical): No    Lack of Transportation (Non-Medical): No  Physical Activity: Sufficiently Active (04/11/2023)   Exercise Vital Sign    Days of Exercise per Week: 7 days    Minutes of Exercise per Session: 40 min  Stress: Not on file  Social Connections: Not on file  Intimate Partner Violence: Unknown (05/28/2022)   Humiliation, Afraid, Rape, and Kick questionnaire    Fear of Current or Ex-Partner: No    Emotionally Abused: No    Physically Abused: No    Sexually Abused: Not on file     Review of Systems   Gen: Denies any fever, chills, fatigue, weight loss, lack of appetite.  CV: Denies chest pain, heart palpitations, peripheral edema, syncope.  Resp: Denies shortness of breath at rest or with exertion. Denies wheezing or cough.  GI: Denies dysphagia or odynophagia. Denies jaundice, hematemesis, fecal incontinence. GU : Denies urinary burning, urinary frequency, urinary hesitancy MS: Denies joint pain, muscle weakness, cramps, or limitation of movement.  Derm: Denies rash, itching, dry skin Psych: Denies depression, anxiety, memory loss, and confusion Heme: Denies bruising, bleeding, and enlarged lymph nodes.   Physical Exam   BP (!) 143/96 (BP Location: Right Arm, Patient Position: Sitting, Cuff Size: Normal)   Pulse (!) 102   Temp 98.5 F (36.9 C) (Oral)   Ht 5' 6 (1.676 m)   Wt 299 lb (135.6 kg)   SpO2 93%   BMI 48.26 kg/m  General:   Alert and oriented. Pleasant and cooperative. Well-nourished and well-developed.  Head:  Normocephalic and atraumatic. Eyes:  Without icterus Abdomen:  +BS, soft, non-tender and non-distended.  No HSM noted. No guarding or rebound. No masses appreciated.  Rectal:  Deferred  Msk:  Symmetrical without gross deformities. Normal posture. Extremities:  Without edema. Neurologic:  Alert and  oriented x4;  grossly normal neurologically. Skin:  Intact without significant lesions or rashes. Psych:  Alert and cooperative. Normal mood and affect.   Assessment   Mathew Lara is a 63 y.o. male presenting today with a history of hepatitis C, genotype 1b treated with [name] 2016 with SVR, anxiety, COPD, CKD stage III, HTN, HLD, dialysis in the past but now none for 5-6 months,  remote history of alcohol and polysubstance abuse, diabetes, pancreatitis suspected secondary to gallstones vs hypertriglyceridemia and pancreatic pseudocyst, gallbladder polyp s/p cholecystectomy, liver biopsy in July 2022 with path noting chronic hepatitis minimal activity (Grade 1) and bridging fibrosis (stage 3), returning in follow-up today.  In light of known history of Hep C with documented stage 3 fibrosis, he will need to be enrolled in surveillance US . He also does have hepatic steatosis underlying, but Rezdiffra may not be the best agent as there is limited data with CKD disease. Will review further. Low ELF score.   GERD controlled by pantoprazole  once daily without alarm signs/symptoms.   Pancreatic pseudocyst: surveillance now.   Colon polyps: surveillance due in OCt 2025.      PLAN    CT abdomen without contrast (due to renal disease) Reviewing Rezdiffra further prior to starting due to lower GFR 6 month return PPI daily   Therisa MICAEL Stager, PhD, [facility] Gastroenterology

## 2023-10-14 DIAGNOSIS — Z0001 Encounter for general adult medical examination with abnormal findings: Secondary | ICD-10-CM | POA: Diagnosis not present

## 2023-10-14 DIAGNOSIS — G894 Chronic pain syndrome: Secondary | ICD-10-CM | POA: Diagnosis not present

## 2023-10-14 DIAGNOSIS — J449 Chronic obstructive pulmonary disease, unspecified: Secondary | ICD-10-CM | POA: Diagnosis not present

## 2023-10-14 DIAGNOSIS — K702 Alcoholic fibrosis and sclerosis of liver: Secondary | ICD-10-CM | POA: Diagnosis not present

## 2023-10-14 DIAGNOSIS — N184 Chronic kidney disease, stage 4 (severe): Secondary | ICD-10-CM | POA: Diagnosis not present

## 2023-10-14 DIAGNOSIS — I10 Essential (primary) hypertension: Secondary | ICD-10-CM | POA: Diagnosis not present

## 2023-10-14 DIAGNOSIS — E1122 Type 2 diabetes mellitus with diabetic chronic kidney disease: Secondary | ICD-10-CM | POA: Diagnosis not present

## 2023-10-14 DIAGNOSIS — E1129 Type 2 diabetes mellitus with other diabetic kidney complication: Secondary | ICD-10-CM | POA: Diagnosis not present

## 2023-10-23 ENCOUNTER — Ambulatory Visit (HOSPITAL_COMMUNITY)
Admission: RE | Admit: 2023-10-23 | Discharge: 2023-10-23 | Disposition: A | Discharge status: Home / Self Care | Payer: Medicare HMO | Source: Ambulatory Visit | Attending: Gastroenterology | Admitting: Gastroenterology

## 2023-10-23 DIAGNOSIS — R188 Other ascites: Secondary | ICD-10-CM | POA: Diagnosis not present

## 2023-10-23 DIAGNOSIS — N189 Chronic kidney disease, unspecified: Secondary | ICD-10-CM | POA: Diagnosis not present

## 2023-10-23 DIAGNOSIS — K863 Pseudocyst of pancreas: Secondary | ICD-10-CM | POA: Diagnosis not present

## 2023-10-23 DIAGNOSIS — K5730 Diverticulosis of large intestine without perforation or abscess without bleeding: Secondary | ICD-10-CM | POA: Diagnosis not present

## 2023-11-11 ENCOUNTER — Telehealth: Payer: Self-pay | Admitting: Internal Medicine

## 2023-11-11 MED ORDER — ALBUTEROL SULFATE HFA 108 (90 BASE) MCG/ACT IN AERS: 2.0000 | INHALATION_SPRAY | Freq: Four times a day (QID) | RESPIRATORY_TRACT | 6 refills | Status: AC | PRN

## 2023-11-11 NOTE — Telephone Encounter (Signed)
 Patient needs refill on albuterol sent to [facility] in [place]. Last seen in 2023- made an appointment but needs medication

## 2023-11-11 NOTE — Telephone Encounter (Signed)
 Refill sent.

## 2023-11-11 NOTE — Telephone Encounter (Signed)
 I called and spoke with pt. Pt states he needs a refill on Albuterol. I do not see this in his med list or outside med list. Pt states he has been wheezing and needs this to help. Pt has not been seen since 03-16-2022 and has a upcoming appointment with Dr Sherene Sires in 01-18-24. Please advise if this can be sent in for pt for courtesy until he is seen in May.

## 2023-11-11 NOTE — Telephone Encounter (Signed)
 Ok x one  with one refill

## 2023-11-18 DIAGNOSIS — E1129 Type 2 diabetes mellitus with other diabetic kidney complication: Secondary | ICD-10-CM | POA: Diagnosis not present

## 2023-11-18 DIAGNOSIS — I10 Essential (primary) hypertension: Secondary | ICD-10-CM | POA: Diagnosis not present

## 2023-11-18 DIAGNOSIS — Z6841 Body Mass Index (BMI) 40.0 and over, adult: Secondary | ICD-10-CM | POA: Diagnosis not present

## 2023-11-18 DIAGNOSIS — N184 Chronic kidney disease, stage 4 (severe): Secondary | ICD-10-CM | POA: Diagnosis not present

## 2023-11-18 DIAGNOSIS — G894 Chronic pain syndrome: Secondary | ICD-10-CM | POA: Diagnosis not present

## 2023-12-15 DIAGNOSIS — J449 Chronic obstructive pulmonary disease, unspecified: Secondary | ICD-10-CM | POA: Diagnosis not present

## 2023-12-15 DIAGNOSIS — I129 Hypertensive chronic kidney disease with stage 1 through stage 4 chronic kidney disease, or unspecified chronic kidney disease: Secondary | ICD-10-CM | POA: Diagnosis not present

## 2023-12-15 DIAGNOSIS — Z9889 Other specified postprocedural states: Secondary | ICD-10-CM | POA: Diagnosis not present

## 2023-12-15 DIAGNOSIS — R809 Proteinuria, unspecified: Secondary | ICD-10-CM | POA: Diagnosis not present

## 2023-12-15 DIAGNOSIS — N184 Chronic kidney disease, stage 4 (severe): Secondary | ICD-10-CM | POA: Diagnosis not present

## 2023-12-15 DIAGNOSIS — N2581 Secondary hyperparathyroidism of renal origin: Secondary | ICD-10-CM | POA: Diagnosis not present

## 2023-12-15 DIAGNOSIS — D631 Anemia in chronic kidney disease: Secondary | ICD-10-CM | POA: Diagnosis not present

## 2023-12-15 DIAGNOSIS — E1122 Type 2 diabetes mellitus with diabetic chronic kidney disease: Secondary | ICD-10-CM | POA: Diagnosis not present

## 2023-12-16 DIAGNOSIS — N184 Chronic kidney disease, stage 4 (severe): Secondary | ICD-10-CM | POA: Diagnosis not present

## 2023-12-16 DIAGNOSIS — E1129 Type 2 diabetes mellitus with other diabetic kidney complication: Secondary | ICD-10-CM | POA: Diagnosis not present

## 2023-12-16 DIAGNOSIS — Z6841 Body Mass Index (BMI) 40.0 and over, adult: Secondary | ICD-10-CM | POA: Diagnosis not present

## 2023-12-23 ENCOUNTER — Other Ambulatory Visit: Payer: Self-pay | Admitting: Gastroenterology

## 2023-12-23 DIAGNOSIS — K760 Fatty (change of) liver, not elsewhere classified: Secondary | ICD-10-CM

## 2024-01-13 DIAGNOSIS — Z6841 Body Mass Index (BMI) 40.0 and over, adult: Secondary | ICD-10-CM | POA: Diagnosis not present

## 2024-01-13 DIAGNOSIS — G894 Chronic pain syndrome: Secondary | ICD-10-CM | POA: Diagnosis not present

## 2024-01-13 DIAGNOSIS — E1129 Type 2 diabetes mellitus with other diabetic kidney complication: Secondary | ICD-10-CM | POA: Diagnosis not present

## 2024-01-15 NOTE — Progress Notes (Deleted)
 Mathew Lara, male    DOB: Aug 02, 1961   MRN: [id number]   Brief patient profile:  37 yobm   *** referred to pulmonary clinic 01/18/2024 by *** for ***    s/ PCCM involvement in last admit?   Admit date:     06/09/2022  Discharge date: 06/12/22   Discharge Diagnoses:   Clotted dialysis access [facility])   COPD (chronic obstructive pulmonary disease) (HCC)   OSA on CPAP   End stage renal disease on dialysis (HCC)   Type 2 diabetes mellitus (HCC)   Obesity, Class II, BMI 35-39.9  History of Present Illness  01/18/2024  Pulmonary/ 1st office eval/Mathew Lara  No chief complaint on file.    Dyspnea:  *** Cough: *** Sleep: *** SABA use: *** 02 use:*** LDSCT:***  No obvious day to day or daytime pattern/variability or assoc excess/ purulent sputum or mucus plugs or hemoptysis or cp or chest tightness, subjective wheeze or overt sinus or hb symptoms.    Also denies any obvious fluctuation of symptoms with weather or environmental changes or other aggravating or alleviating factors except as outlined above   No unusual exposure hx or h/o childhood pna/ asthma or knowledge of premature birth.  Current Allergies, Complete Past Medical History, Past Surgical History, Family History, and Social History were reviewed in [employer] record.  ROS  The following are not active complaints unless bolded Hoarseness, sore throat, dysphagia, dental problems, itching, sneezing,  nasal congestion or discharge of excess mucus or purulent secretions, ear ache,   fever, chills, sweats, unintended wt loss or wt gain, classically pleuritic or exertional cp,  orthopnea pnd or arm/hand swelling  or leg swelling, presyncope, palpitations, abdominal pain, anorexia, nausea, vomiting, diarrhea  or change in bowel habits or change in bladder habits, change in stools or change in urine, dysuria, hematuria,  rash, arthralgias, visual complaints, headache, numbness, weakness or ataxia or problems with  walking or coordination,  change in mood or  memory.             Outpatient Medications Prior to Visit  Medication Sig Dispense Refill   acetaminophen  (TYLENOL ) 325 MG tablet Take 2 tablets (650 mg total) by mouth every 6 (six) hours as needed for mild pain (or Fever >/= 101). 120 tablet 0   albuterol  (VENTOLIN  HFA) 108 (90 Base) MCG/ACT inhaler Inhale 2 puffs into the lungs every 6 (six) hours as needed for wheezing or shortness of breath. 8 g 6   amLODipine  (NORVASC ) 10 MG tablet Take 10 mg by mouth daily.     aspirin  EC 81 MG tablet Take 1 tablet (81 mg total) by mouth daily with breakfast. 30 tablet 11   atorvastatin  (LIPITOR ) 80 MG tablet Take 1 tablet (80 mg total) by mouth daily. (Patient taking differently: Take 80 mg by mouth at bedtime.)     B Complex-C-Zn-Folic Acid  (DIALYVITE 800 WITH ZINC ) 0.8 MG TABS Take 1 tablet by mouth daily.     ferrous fumarate  (FERRETTS) 325 (106 Fe) MG TABS tablet Take 1 tablet by mouth daily.     HYDROcodone -acetaminophen  (NORCO/VICODIN) 5-325 MG tablet Take 1 tablet by mouth 3 (three) times daily as needed.     insulin  aspart (NOVOLOG ) 100 UNIT/ML injection Inject 1-3 Units into the skin 3 (three) times daily with meals. (Patient taking differently: Inject 2-12 Units into the skin See admin instructions. Inject per sliding scale: If 201-250=2 units 251-300=4 units 301-350=6 units 351-400=8 units 401-450=10 units 451-500=12 units If reads  high, give 14 units, recheck in 2 hours. Call MD if blood sugar is >400 or <10) 10 mL 11   insulin  detemir (LEVEMIR ) 100 UNIT/ML injection Inject 0.08 mLs (8 Units total) into the skin 2 (two) times daily. 10 mL 11   KERENDIA 10 MG TABS Take 1 tablet by mouth daily.     loratadine  (CLARITIN ) 10 MG tablet Take 1 tablet (10 mg total) by mouth daily. 30 tablet 1   midodrine  (PROAMATINE ) 10 MG tablet Take 1 tablet (10 mg total) by mouth 3 (three) times daily with meals. 90 tablet 3   Multiple Vitamin (MULTIVITAMIN)  tablet Take 1 tablet by mouth daily.     pantoprazole  (PROTONIX ) 40 MG tablet TAKE 1 TABLET BY MOUTH ONCE DAILY 30 MINUTES BEFORE BREAKFAST 90 tablet 3   sevelamer  carbonate (RENVELA ) 800 MG tablet Take 3 tablets (2,400 mg total) by mouth 3 (three) times daily with meals.     Vitamin D , Ergocalciferol , (DRISDOL) 1.25 MG (50000 UNIT) CAPS capsule Take 50,000 Units by mouth once a week.     zolpidem  (AMBIEN ) 5 MG tablet Take 1 tablet (5 mg total) by mouth at bedtime as needed for sleep. 10 tablet 0   No facility-administered medications prior to visit.    Past Medical History:  Diagnosis Date   Alcohol abuse    Anxiety    Bronchitis    Chronic hip pain    CKD (chronic kidney disease) stage 3, GFR 30-59 ml/min (HCC)    COPD (chronic obstructive pulmonary disease) (HCC)    DKA (diabetic ketoacidosis) (HCC) 04/10/2022   Essential hypertension    Hepatitis C    Mixed hyperlipidemia    Sleep apnea       Objective:     There were no vitals taken for this visit.         Assessment   No problem-specific Assessment & Plan notes found for this encounter.     Vernestine Gondola, MD 01/15/2024

## 2024-01-18 ENCOUNTER — Encounter: Admitting: Internal Medicine

## 2024-02-01 ENCOUNTER — Encounter: Payer: Self-pay | Admitting: Internal Medicine

## 2024-02-10 DIAGNOSIS — N184 Chronic kidney disease, stage 4 (severe): Secondary | ICD-10-CM | POA: Diagnosis not present

## 2024-02-10 DIAGNOSIS — Z6841 Body Mass Index (BMI) 40.0 and over, adult: Secondary | ICD-10-CM | POA: Diagnosis not present

## 2024-02-10 DIAGNOSIS — I10 Essential (primary) hypertension: Secondary | ICD-10-CM | POA: Diagnosis not present

## 2024-02-10 DIAGNOSIS — K5903 Drug induced constipation: Secondary | ICD-10-CM | POA: Diagnosis not present

## 2024-02-10 DIAGNOSIS — M549 Dorsalgia, unspecified: Secondary | ICD-10-CM | POA: Diagnosis not present

## 2024-02-10 DIAGNOSIS — E1129 Type 2 diabetes mellitus with other diabetic kidney complication: Secondary | ICD-10-CM | POA: Diagnosis not present

## 2024-03-02 DIAGNOSIS — R5383 Other fatigue: Secondary | ICD-10-CM | POA: Diagnosis not present

## 2024-03-02 DIAGNOSIS — M549 Dorsalgia, unspecified: Secondary | ICD-10-CM | POA: Diagnosis not present

## 2024-03-02 DIAGNOSIS — R6882 Decreased libido: Secondary | ICD-10-CM | POA: Diagnosis not present

## 2024-03-02 DIAGNOSIS — E1122 Type 2 diabetes mellitus with diabetic chronic kidney disease: Secondary | ICD-10-CM | POA: Diagnosis not present

## 2024-03-02 DIAGNOSIS — Z6841 Body Mass Index (BMI) 40.0 and over, adult: Secondary | ICD-10-CM | POA: Diagnosis not present

## 2024-03-02 DIAGNOSIS — E1129 Type 2 diabetes mellitus with other diabetic kidney complication: Secondary | ICD-10-CM | POA: Diagnosis not present

## 2024-03-20 DIAGNOSIS — I129 Hypertensive chronic kidney disease with stage 1 through stage 4 chronic kidney disease, or unspecified chronic kidney disease: Secondary | ICD-10-CM | POA: Diagnosis not present

## 2024-03-20 DIAGNOSIS — R809 Proteinuria, unspecified: Secondary | ICD-10-CM | POA: Diagnosis not present

## 2024-03-20 DIAGNOSIS — N2581 Secondary hyperparathyroidism of renal origin: Secondary | ICD-10-CM | POA: Diagnosis not present

## 2024-03-20 DIAGNOSIS — N184 Chronic kidney disease, stage 4 (severe): Secondary | ICD-10-CM | POA: Diagnosis not present

## 2024-03-20 DIAGNOSIS — E1122 Type 2 diabetes mellitus with diabetic chronic kidney disease: Secondary | ICD-10-CM | POA: Diagnosis not present

## 2024-03-20 DIAGNOSIS — J449 Chronic obstructive pulmonary disease, unspecified: Secondary | ICD-10-CM | POA: Diagnosis not present

## 2024-03-20 DIAGNOSIS — D631 Anemia in chronic kidney disease: Secondary | ICD-10-CM | POA: Diagnosis not present

## 2024-03-20 DIAGNOSIS — Z9889 Other specified postprocedural states: Secondary | ICD-10-CM | POA: Diagnosis not present

## 2024-03-20 DIAGNOSIS — E875 Hyperkalemia: Secondary | ICD-10-CM | POA: Diagnosis not present

## 2024-03-20 DIAGNOSIS — N189 Chronic kidney disease, unspecified: Secondary | ICD-10-CM | POA: Diagnosis not present

## 2024-04-04 ENCOUNTER — Encounter: Payer: Self-pay | Admitting: Gastroenterology

## 2024-04-13 DIAGNOSIS — E291 Testicular hypofunction: Secondary | ICD-10-CM | POA: Diagnosis not present

## 2024-04-13 DIAGNOSIS — N184 Chronic kidney disease, stage 4 (severe): Secondary | ICD-10-CM | POA: Diagnosis not present

## 2024-04-13 DIAGNOSIS — Z6841 Body Mass Index (BMI) 40.0 and over, adult: Secondary | ICD-10-CM | POA: Diagnosis not present

## 2024-04-13 DIAGNOSIS — E1129 Type 2 diabetes mellitus with other diabetic kidney complication: Secondary | ICD-10-CM | POA: Diagnosis not present

## 2024-04-13 DIAGNOSIS — M549 Dorsalgia, unspecified: Secondary | ICD-10-CM | POA: Diagnosis not present

## 2024-05-31 ENCOUNTER — Ambulatory Visit (INDEPENDENT_AMBULATORY_CARE_PROVIDER_SITE_OTHER): Admitting: Gastroenterology

## 2024-05-31 ENCOUNTER — Encounter: Payer: Self-pay | Admitting: *Deleted

## 2024-05-31 ENCOUNTER — Encounter: Payer: Self-pay | Admitting: Gastroenterology

## 2024-05-31 ENCOUNTER — Other Ambulatory Visit: Payer: Self-pay | Admitting: *Deleted

## 2024-05-31 VITALS — BP 131/85 | HR 89 | Temp 99.3°F | Ht 68.0 in | Wt 276.8 lb

## 2024-05-31 DIAGNOSIS — Z9049 Acquired absence of other specified parts of digestive tract: Secondary | ICD-10-CM | POA: Diagnosis not present

## 2024-05-31 DIAGNOSIS — Z860100 Personal history of colon polyps, unspecified: Secondary | ICD-10-CM | POA: Insufficient documentation

## 2024-05-31 DIAGNOSIS — K7400 Hepatic fibrosis, unspecified: Secondary | ICD-10-CM | POA: Diagnosis not present

## 2024-05-31 DIAGNOSIS — Z860101 Personal history of adenomatous and serrated colon polyps: Secondary | ICD-10-CM | POA: Diagnosis not present

## 2024-05-31 MED ORDER — PEG 3350-KCL-NA BICARB-NACL 420 G PO SOLR: 4000.0000 mL | Freq: Once | ORAL | 0 refills | Status: AC

## 2024-05-31 NOTE — Patient Instructions (Addendum)
 Please have blood work done when you are able!  We are also arranging an ultrasound of your liver. We recommend this every 6 months  We are arranging a colonoscopy with Dr. Cindie in the near future!  You will hold Mounjaro one week prior. No iron for 7 days prior. No insulin  the morning of the procedure.  We will see you in 6 months or sooner if needed!  I enjoyed seeing you again today! I value our relationship and want to provide genuine, compassionate, and quality care. You may receive a survey regarding your visit with me, and I welcome your feedback! Thanks so much for taking the time to complete this. I look forward to seeing you again.      Mathew MICAEL Stager, PhD, ANP-BC [facility] Gastroenterology  .

## 2024-05-31 NOTE — Progress Notes (Addendum)
 Gastroenterology Office Note     Primary Care Physician:  Marvine Rush, MD  Primary Gastroenterologist: Dr. Cindie    Chief Complaint   Chief Complaint  Patient presents with   Follow-up    Doing well, no issues     History of Present Illness   Mathew Lara is a 63 y.o. male presenting today with a history of  hepatitis C, genotype 1b treated with [name] 2016 with SVR, anxiety, COPD, CKD stage III, HTN, HLD, dialysis in the past but now none for 5-6 months,  remote history of alcohol and polysubstance abuse, diabetes, pancreatitis suspected secondary to gallstones vs hypertriglyceridemia, gallbladder polyp s/p cholecystectomy, liver biopsy in July 2022 with path noting chronic hepatitis minimal activity (Grade 1) and bridging fibrosis (stage 3)   Fibrosis: ELF score 8.99, FIB 4 indeterminate risk, elastography Sept 2024 with IQR ration 0.4, so reduced accuracy. Following with serial ultrasounds.   Purposefully losing weight. On Mounjaro for past 3 months. Pantoprazole  once daily. No dsphagia.   No alcohol. No drug use. No abdominal pain. No overt bleeding. No constipation or diarrhea.   Colonoscopy Oct 2022 with non-bleeding internal hemorrhoids, tubular adenomas, 3 year surveillance recommended.   July 2025: creatinine 2.73, Hgb 12.7, plaletes 289,  Alk phos 171, Tbili 0.3, AST 14, ALT 165   Past Medical History:  Diagnosis Date   Alcohol abuse    Anxiety    Bronchitis    Chronic hip pain    CKD (chronic kidney disease) stage 3, GFR 30-59 ml/min (HCC)    COPD (chronic obstructive pulmonary disease) (HCC)    DKA (diabetic ketoacidosis) (HCC) 04/10/2022   Essential hypertension    Hepatitis C    Mixed hyperlipidemia    Sleep apnea     Past Surgical History:  Procedure Laterality Date   BALLOON DILATION N/A 07/06/2021   Procedure: BALLOON DILATION;  Surgeon: Cindie Carlin POUR, DO;  [facility];  Service: Endoscopy;  Laterality: N/A;   BIOPSY   07/06/2021   Procedure: BIOPSY;  Surgeon: Cindie Carlin POUR, DO;  [facility];  Service: Endoscopy;;   CATARACT EXTRACTION Right    CHOLECYSTECTOMY N/A 03/23/2021   Procedure: LAPAROSCOPIC CHOLECYSTECTOMY;  Surgeon: Kallie Manuelita BROCKS, MD;  Location: [facility];  Service: General;  Laterality: N/A;   COLONOSCOPY N/A 01/23/2014   SLF:The LEFT COLON IS EXTREMELY redundant/TWO RECTAL POLYPS REMOVED/SMALL VOLUME RECTAL BLEEDING MOST LIKELY DUE TO Small internal hemorrhoids. path with prolapse type polyp of benign-mucosa. no adenomas   COLONOSCOPY WITH PROPOFOL  N/A 07/06/2021   non-bleeding internal hemorrhoids, one 8 mm polyp in sigmod s/p removal and clip placed. One 5 mm polyp at recto-sigmoid s/p removal. 3 year surveillance. Tubular adenomas   ESOPHAGOGASTRODUODENOSCOPY N/A 01/23/2014   DOQ:Dryjusxp ring at the gastroesophageal junction/Medium sized hiatal hernia/NDYSPEPSIA MOST LIKELY DUE TO GERD/MILD Non-erosive gastritis   ESOPHAGOGASTRODUODENOSCOPY N/A 04/18/2018   food impaction, small hiatal hernia   ESOPHAGOGASTRODUODENOSCOPY (EGD) WITH PROPOFOL  N/A 07/06/2021   moderate Schatzki ring s/p dilation, gastritis s/p biopsy, normal duodenum. Negative H.pylori.   HEMORRHOID BANDING  01/23/2014   Procedure: HEMORRHOID BANDING;  Surgeon: Margo LITTIE Haddock, MD;  [facility];  Service: Endoscopy;;   IR FLUORO GUIDE CV LINE LEFT  05/04/2022   IR FLUORO GUIDE CV LINE LEFT  05/13/2022   IR FLUORO GUIDE CV LINE LEFT  06/11/2022   IR US  GUIDE VASC ACCESS LEFT  05/04/2022   JOINT REPLACEMENT Right 2017   LIVER BIOPSY  N/A 03/23/2021   Procedure: LAPAROSCOPIC LIVER BIOPSY;  Surgeon: Kallie Manuelita BROCKS, MD;  Location: [facility];  Service: General;  Laterality: N/A;   MOUTH SURGERY     POLYPECTOMY  07/06/2021   Procedure: POLYPECTOMY;  Surgeon: Cindie Carlin POUR, DO;  [facility];  Service: Endoscopy;;   TOTAL HIP ARTHROPLASTY Right 06/04/2016   Procedure: RIGHT TOTAL HIP  ARTHROPLASTY ANTERIOR APPROACH;  Surgeon: Lonni CINDERELLA Poli, MD;  Location: [facility];  Service: Orthopedics;  Laterality: Right;   TOTAL HIP ARTHROPLASTY Left 03/02/2019   Procedure: LEFT TOTAL HIP ARTHROPLASTY ANTERIOR APPROACH;  Surgeon: Poli Lonni CINDERELLA, MD;  Location: [facility];  Service: Orthopedics;  Laterality: Left;    Current Outpatient Medications  Medication Sig Dispense Refill   acetaminophen  (TYLENOL ) 325 MG tablet Take 2 tablets (650 mg total) by mouth every 6 (six) hours as needed for mild pain (or Fever >/= 101). 120 tablet 0   albuterol  (VENTOLIN  HFA) 108 (90 Base) MCG/ACT inhaler Inhale 2 puffs into the lungs every 6 (six) hours as needed for wheezing or shortness of breath. 8 g 6   amLODipine  (NORVASC ) 10 MG tablet Take 10 mg by mouth daily.     aspirin  EC 81 MG tablet Take 1 tablet (81 mg total) by mouth daily with breakfast. 30 tablet 11   atorvastatin  (LIPITOR ) 80 MG tablet Take 1 tablet (80 mg total) by mouth daily. (Patient taking differently: Take 80 mg by mouth at bedtime.)     B Complex-C-Zn-Folic Acid  (DIALYVITE 800 WITH ZINC ) 0.8 MG TABS Take 1 tablet by mouth daily.     ferrous fumarate  (FERRETTS) 325 (106 Fe) MG TABS tablet Take 1 tablet by mouth daily.     HYDROcodone -acetaminophen  (NORCO/VICODIN) 5-325 MG tablet Take 1 tablet by mouth 3 (three) times daily as needed.     insulin  aspart (NOVOLOG ) 100 UNIT/ML injection Inject 1-3 Units into the skin 3 (three) times daily with meals. (Patient taking differently: Inject 2-12 Units into the skin See admin instructions. Inject per sliding scale: If 201-250=2 units 251-300=4 units 301-350=6 units 351-400=8 units 401-450=10 units 451-500=12 units If reads high, give 14 units, recheck in 2 hours. Call MD if blood sugar is >400 or <10) 10 mL 11   insulin  detemir (LEVEMIR ) 100 UNIT/ML injection Inject 0.08 mLs (8 Units total) into the skin 2 (two) times daily. 10 mL 11   KERENDIA 10 MG TABS Take 1 tablet by mouth  daily.     loratadine  (CLARITIN ) 10 MG tablet Take 1 tablet (10 mg total) by mouth daily. 30 tablet 1   midodrine  (PROAMATINE ) 10 MG tablet Take 1 tablet (10 mg total) by mouth 3 (three) times daily with meals. 90 tablet 3   MOUNJARO 7.5 MG/0.5ML Pen Inject 7.5 mg into the skin once a week.     Multiple Vitamin (MULTIVITAMIN) tablet Take 1 tablet by mouth daily.     oxyCODONE -acetaminophen  (PERCOCET) 7.5-325 MG tablet Take 1 tablet by mouth 3 (three) times daily.     pantoprazole  (PROTONIX ) 40 MG tablet TAKE 1 TABLET BY MOUTH ONCE DAILY 30 MINUTES BEFORE BREAKFAST 90 tablet 3   sevelamer  carbonate (RENVELA ) 800 MG tablet Take 3 tablets (2,400 mg total) by mouth 3 (three) times daily with meals.     Vitamin D , Ergocalciferol , (DRISDOL) 1.25 MG (50000 UNIT) CAPS capsule Take 50,000 Units by mouth once a week.     zolpidem  (AMBIEN ) 5 MG tablet Take 1 tablet (5 mg total) by mouth at bedtime  as needed for sleep. 10 tablet 0   No current facility-administered medications for this visit.    Allergies as of 05/31/2024   (No Known Allergies)    Family History  Problem Relation Age of Onset   Cancer Mother    Colon cancer Neg Hx    Colon polyps Neg Hx     Social History   Socioeconomic History   Marital status: Single    Spouse name: Not on file   Number of children: 1   Years of education: 10   Highest education level: Not on file  Occupational History    Comment: [employer]  Tobacco Use   Smoking status: Former    Current packs/day: 0.00    Average packs/day: 1 pack/day for 42.7 years (42.7 ttl pk-yrs)    Types: Cigarettes    Start date: 09/07/1971    Quit date: 05/28/2014    Years since quitting: 10.0    Passive exposure: Past   Smokeless tobacco: Never   Tobacco comments:    quit in November 2015 after smoking x 20 yrs.  Vaping Use   Vaping status: Never Used  Substance and Sexual Activity   Alcohol use: Not Currently    Alcohol/week: 0.0 standard drinks of alcohol   Drug  use: No   Sexual activity: Not on file  Other Topics Concern   Not on file  Social History Narrative       patient consumes 4 cups of caffeine daily.      Patient stopped drinking years ago.   Social Drivers of Corporate investment banker Strain: Low Risk  (04/11/2023)   Overall Financial Resource Strain (CARDIA)    Difficulty of Paying Living Expenses: Not very hard  Food Insecurity: No Food Insecurity (04/11/2023)   Hunger Vital Sign    Worried About Running Out of Food in the Last Year: Never true    Ran Out of Food in the Last Year: Never true  Transportation Needs: No Transportation Needs (04/11/2023)   PRAPARE - Administrator, Civil Service (Medical): No    Lack of Transportation (Non-Medical): No  Physical Activity: Sufficiently Active (04/11/2023)   Exercise Vital Sign    Days of Exercise per Week: 7 days    Minutes of Exercise per Session: 40 min  Stress: Not on file  Social Connections: Not on file  Intimate Partner Violence: Unknown (05/28/2022)   Humiliation, Afraid, Rape, and Kick questionnaire    Fear of Current or Ex-Partner: No    Emotionally Abused: No    Physically Abused: No    Sexually Abused: Not on file     Review of Systems   Gen: Denies any fever, chills, fatigue, weight loss, lack of appetite.  CV: Denies chest pain, heart palpitations, peripheral edema, syncope.  Resp: Denies shortness of breath at rest or with exertion. Denies wheezing or cough.  GI: Denies dysphagia or odynophagia. Denies jaundice, hematemesis, fecal incontinence. GU : Denies urinary burning, urinary frequency, urinary hesitancy MS: Denies joint pain, muscle weakness, cramps, or limitation of movement.  Derm: Denies rash, itching, dry skin Psych: Denies depression, anxiety, memory loss, and confusion Heme: Denies bruising, bleeding, and enlarged lymph nodes.   Physical Exam   BP 131/85 (BP Location: Right Arm, Patient Position: Sitting, Cuff Size: Large)   Pulse 89    Temp 99.3 F (37.4 C) (Oral)   Ht 5' 8 (1.727 m)   Wt 276 lb 12.8 oz (125.6 kg)   BMI  42.09 kg/m  General:   Alert and oriented. Pleasant and cooperative. Well-nourished and well-developed.  Head:  Normocephalic and atraumatic. Eyes:  Without icterus Abdomen:  +BS, soft, non-tender and non-distended. No HSM noted. No guarding or rebound. No masses appreciated.  Rectal:  Deferred  Msk:  Symmetrical without gross deformities. Normal posture. Extremities:  Without edema. Neurologic:  Alert and  oriented x4;  grossly normal neurologically. Skin:  Intact without significant lesions or rashes. Psych:  Alert and cooperative. Normal mood and affect.   Assessment   DARALD UZZLE is a 63 y.o. male presenting today with a history of  hepatitis C, genotype 1b treated with [name] 2016 with SVR, anxiety, COPD, CKD stage III, HTN, HLD, dialysis in the past but now none for 5-6 months,  remote history of alcohol and polysubstance abuse, diabetes, pancreatitis suspected secondary to gallstones vs hypertriglyceridemia, gallbladder polyp s/p cholecystectomy, liver biopsy in July 2022 with path noting chronic hepatitis minimal activity (Grade 1) and bridging fibrosis (stage 3), returning to arrange colonoscopy.  Last colonoscopy Oct 2022 with tubular adneomas and 3 year surveillance recommended. Will schedule this for near future.  F3 fibrosis: with hx of Hep C, high risk for HCC and progression to cirrhosis in light of F3 status historically. ELF score 8.99, elastography last year with reduced accuracy. If evidence for cirrhosis in future, will need to determine timing of screening EGD for varices by reviewing high risk features including thrombocytopenia. Currently, platelets are 289. US  upcoming for HCC screening.     PLAN    US  abdomen complete Labs including AFP Proceed with colonoscopy by Dr. Cindie  in near future: the risks, benefits, and alternatives have been discussed with the patient  in detail. The patient states understanding and desires to proceed.  Hold Mounjaro one week prior, no iron 7 days prior, no insulin  morning of procedure 6 month follow-up or sooner if needed    Therisa MICAEL Stager, PhD, ANP-BC [facility]

## 2024-05-31 NOTE — H&P (View-Only) (Signed)
 Gastroenterology Office Note     Primary Care Physician:  Marvine Rush, MD  Primary Gastroenterologist: Dr. Cindie    Chief Complaint   Chief Complaint  Patient presents with   Follow-up    Doing well, no issues     History of Present Illness   Mathew Lara is a 63 y.o. male presenting today with a history of  hepatitis C, genotype 1b treated with [name] 2016 with SVR, anxiety, COPD, CKD stage III, HTN, HLD, dialysis in the past but now none for 5-6 months,  remote history of alcohol and polysubstance abuse, diabetes, pancreatitis suspected secondary to gallstones vs hypertriglyceridemia, gallbladder polyp s/p cholecystectomy, liver biopsy in July 2022 with path noting chronic hepatitis minimal activity (Grade 1) and bridging fibrosis (stage 3)   Fibrosis: ELF score 8.99, FIB 4 indeterminate risk, elastography Sept 2024 with IQR ration 0.4, so reduced accuracy. Following with serial ultrasounds.   Purposefully losing weight. On Mounjaro for past 3 months. Pantoprazole  once daily. No dsphagia.   No alcohol. No drug use. No abdominal pain. No overt bleeding. No constipation or diarrhea.   Colonoscopy Oct 2022 with non-bleeding internal hemorrhoids, tubular adenomas, 3 year surveillance recommended.   July 2025: creatinine 2.73, Hgb 12.7, plaletes 289,  Alk phos 171, Tbili 0.3, AST 14, ALT 165   Past Medical History:  Diagnosis Date   Alcohol abuse    Anxiety    Bronchitis    Chronic hip pain    CKD (chronic kidney disease) stage 3, GFR 30-59 ml/min (HCC)    COPD (chronic obstructive pulmonary disease) (HCC)    DKA (diabetic ketoacidosis) (HCC) 04/10/2022   Essential hypertension    Hepatitis C    Mixed hyperlipidemia    Sleep apnea     Past Surgical History:  Procedure Laterality Date   BALLOON DILATION N/A 07/06/2021   Procedure: BALLOON DILATION;  Surgeon: Cindie Carlin POUR, DO;  [facility];  Service: Endoscopy;  Laterality: N/A;   BIOPSY   07/06/2021   Procedure: BIOPSY;  Surgeon: Cindie Carlin POUR, DO;  [facility];  Service: Endoscopy;;   CATARACT EXTRACTION Right    CHOLECYSTECTOMY N/A 03/23/2021   Procedure: LAPAROSCOPIC CHOLECYSTECTOMY;  Surgeon: Kallie Manuelita BROCKS, MD;  Location: [facility];  Service: General;  Laterality: N/A;   COLONOSCOPY N/A 01/23/2014   SLF:The LEFT COLON IS EXTREMELY redundant/TWO RECTAL POLYPS REMOVED/SMALL VOLUME RECTAL BLEEDING MOST LIKELY DUE TO Small internal hemorrhoids. path with prolapse type polyp of benign-mucosa. no adenomas   COLONOSCOPY WITH PROPOFOL  N/A 07/06/2021   non-bleeding internal hemorrhoids, one 8 mm polyp in sigmod s/p removal and clip placed. One 5 mm polyp at recto-sigmoid s/p removal. 3 year surveillance. Tubular adenomas   ESOPHAGOGASTRODUODENOSCOPY N/A 01/23/2014   DOQ:Dryjusxp ring at the gastroesophageal junction/Medium sized hiatal hernia/NDYSPEPSIA MOST LIKELY DUE TO GERD/MILD Non-erosive gastritis   ESOPHAGOGASTRODUODENOSCOPY N/A 04/18/2018   food impaction, small hiatal hernia   ESOPHAGOGASTRODUODENOSCOPY (EGD) WITH PROPOFOL  N/A 07/06/2021   moderate Schatzki ring s/p dilation, gastritis s/p biopsy, normal duodenum. Negative H.pylori.   HEMORRHOID BANDING  01/23/2014   Procedure: HEMORRHOID BANDING;  Surgeon: Margo LITTIE Haddock, MD;  [facility];  Service: Endoscopy;;   IR FLUORO GUIDE CV LINE LEFT  05/04/2022   IR FLUORO GUIDE CV LINE LEFT  05/13/2022   IR FLUORO GUIDE CV LINE LEFT  06/11/2022   IR US  GUIDE VASC ACCESS LEFT  05/04/2022   JOINT REPLACEMENT Right 2017   LIVER BIOPSY  N/A 03/23/2021   Procedure: LAPAROSCOPIC LIVER BIOPSY;  Surgeon: Kallie Manuelita BROCKS, MD;  Location: [facility];  Service: General;  Laterality: N/A;   MOUTH SURGERY     POLYPECTOMY  07/06/2021   Procedure: POLYPECTOMY;  Surgeon: Cindie Carlin POUR, DO;  [facility];  Service: Endoscopy;;   TOTAL HIP ARTHROPLASTY Right 06/04/2016   Procedure: RIGHT TOTAL HIP  ARTHROPLASTY ANTERIOR APPROACH;  Surgeon: Lonni CINDERELLA Poli, MD;  Location: [facility];  Service: Orthopedics;  Laterality: Right;   TOTAL HIP ARTHROPLASTY Left 03/02/2019   Procedure: LEFT TOTAL HIP ARTHROPLASTY ANTERIOR APPROACH;  Surgeon: Poli Lonni CINDERELLA, MD;  Location: [facility];  Service: Orthopedics;  Laterality: Left;    Current Outpatient Medications  Medication Sig Dispense Refill   acetaminophen  (TYLENOL ) 325 MG tablet Take 2 tablets (650 mg total) by mouth every 6 (six) hours as needed for mild pain (or Fever >/= 101). 120 tablet 0   albuterol  (VENTOLIN  HFA) 108 (90 Base) MCG/ACT inhaler Inhale 2 puffs into the lungs every 6 (six) hours as needed for wheezing or shortness of breath. 8 g 6   amLODipine  (NORVASC ) 10 MG tablet Take 10 mg by mouth daily.     aspirin  EC 81 MG tablet Take 1 tablet (81 mg total) by mouth daily with breakfast. 30 tablet 11   atorvastatin  (LIPITOR ) 80 MG tablet Take 1 tablet (80 mg total) by mouth daily. (Patient taking differently: Take 80 mg by mouth at bedtime.)     B Complex-C-Zn-Folic Acid  (DIALYVITE 800 WITH ZINC ) 0.8 MG TABS Take 1 tablet by mouth daily.     ferrous fumarate  (FERRETTS) 325 (106 Fe) MG TABS tablet Take 1 tablet by mouth daily.     HYDROcodone -acetaminophen  (NORCO/VICODIN) 5-325 MG tablet Take 1 tablet by mouth 3 (three) times daily as needed.     insulin  aspart (NOVOLOG ) 100 UNIT/ML injection Inject 1-3 Units into the skin 3 (three) times daily with meals. (Patient taking differently: Inject 2-12 Units into the skin See admin instructions. Inject per sliding scale: If 201-250=2 units 251-300=4 units 301-350=6 units 351-400=8 units 401-450=10 units 451-500=12 units If reads high, give 14 units, recheck in 2 hours. Call MD if blood sugar is >400 or <10) 10 mL 11   insulin  detemir (LEVEMIR ) 100 UNIT/ML injection Inject 0.08 mLs (8 Units total) into the skin 2 (two) times daily. 10 mL 11   KERENDIA 10 MG TABS Take 1 tablet by mouth  daily.     loratadine  (CLARITIN ) 10 MG tablet Take 1 tablet (10 mg total) by mouth daily. 30 tablet 1   midodrine  (PROAMATINE ) 10 MG tablet Take 1 tablet (10 mg total) by mouth 3 (three) times daily with meals. 90 tablet 3   MOUNJARO 7.5 MG/0.5ML Pen Inject 7.5 mg into the skin once a week.     Multiple Vitamin (MULTIVITAMIN) tablet Take 1 tablet by mouth daily.     oxyCODONE -acetaminophen  (PERCOCET) 7.5-325 MG tablet Take 1 tablet by mouth 3 (three) times daily.     pantoprazole  (PROTONIX ) 40 MG tablet TAKE 1 TABLET BY MOUTH ONCE DAILY 30 MINUTES BEFORE BREAKFAST 90 tablet 3   sevelamer  carbonate (RENVELA ) 800 MG tablet Take 3 tablets (2,400 mg total) by mouth 3 (three) times daily with meals.     Vitamin D , Ergocalciferol , (DRISDOL) 1.25 MG (50000 UNIT) CAPS capsule Take 50,000 Units by mouth once a week.     zolpidem  (AMBIEN ) 5 MG tablet Take 1 tablet (5 mg total) by mouth at bedtime  as needed for sleep. 10 tablet 0   No current facility-administered medications for this visit.    Allergies as of 05/31/2024   (No Known Allergies)    Family History  Problem Relation Age of Onset   Cancer Mother    Colon cancer Neg Hx    Colon polyps Neg Hx     Social History   Socioeconomic History   Marital status: Single    Spouse name: Not on file   Number of children: 1   Years of education: 10   Highest education level: Not on file  Occupational History    Comment: [employer]  Tobacco Use   Smoking status: Former    Current packs/day: 0.00    Average packs/day: 1 pack/day for 42.7 years (42.7 ttl pk-yrs)    Types: Cigarettes    Start date: 09/07/1971    Quit date: 05/28/2014    Years since quitting: 10.0    Passive exposure: Past   Smokeless tobacco: Never   Tobacco comments:    quit in November 2015 after smoking x 20 yrs.  Vaping Use   Vaping status: Never Used  Substance and Sexual Activity   Alcohol use: Not Currently    Alcohol/week: 0.0 standard drinks of alcohol   Drug  use: No   Sexual activity: Not on file  Other Topics Concern   Not on file  Social History Narrative       patient consumes 4 cups of caffeine daily.      Patient stopped drinking years ago.   Social Drivers of Corporate investment banker Strain: Low Risk  (04/11/2023)   Overall Financial Resource Strain (CARDIA)    Difficulty of Paying Living Expenses: Not very hard  Food Insecurity: No Food Insecurity (04/11/2023)   Hunger Vital Sign    Worried About Running Out of Food in the Last Year: Never true    Ran Out of Food in the Last Year: Never true  Transportation Needs: No Transportation Needs (04/11/2023)   PRAPARE - Administrator, Civil Service (Medical): No    Lack of Transportation (Non-Medical): No  Physical Activity: Sufficiently Active (04/11/2023)   Exercise Vital Sign    Days of Exercise per Week: 7 days    Minutes of Exercise per Session: 40 min  Stress: Not on file  Social Connections: Not on file  Intimate Partner Violence: Unknown (05/28/2022)   Humiliation, Afraid, Rape, and Kick questionnaire    Fear of Current or Ex-Partner: No    Emotionally Abused: No    Physically Abused: No    Sexually Abused: Not on file     Review of Systems   Gen: Denies any fever, chills, fatigue, weight loss, lack of appetite.  CV: Denies chest pain, heart palpitations, peripheral edema, syncope.  Resp: Denies shortness of breath at rest or with exertion. Denies wheezing or cough.  GI: Denies dysphagia or odynophagia. Denies jaundice, hematemesis, fecal incontinence. GU : Denies urinary burning, urinary frequency, urinary hesitancy MS: Denies joint pain, muscle weakness, cramps, or limitation of movement.  Derm: Denies rash, itching, dry skin Psych: Denies depression, anxiety, memory loss, and confusion Heme: Denies bruising, bleeding, and enlarged lymph nodes.   Physical Exam   BP 131/85 (BP Location: Right Arm, Patient Position: Sitting, Cuff Size: Large)   Pulse 89    Temp 99.3 F (37.4 C) (Oral)   Ht 5' 8 (1.727 m)   Wt 276 lb 12.8 oz (125.6 kg)   BMI  42.09 kg/m  General:   Alert and oriented. Pleasant and cooperative. Well-nourished and well-developed.  Head:  Normocephalic and atraumatic. Eyes:  Without icterus Abdomen:  +BS, soft, non-tender and non-distended. No HSM noted. No guarding or rebound. No masses appreciated.  Rectal:  Deferred  Msk:  Symmetrical without gross deformities. Normal posture. Extremities:  Without edema. Neurologic:  Alert and  oriented x4;  grossly normal neurologically. Skin:  Intact without significant lesions or rashes. Psych:  Alert and cooperative. Normal mood and affect.   Assessment   DARALD UZZLE is a 63 y.o. male presenting today with a history of  hepatitis C, genotype 1b treated with [name] 2016 with SVR, anxiety, COPD, CKD stage III, HTN, HLD, dialysis in the past but now none for 5-6 months,  remote history of alcohol and polysubstance abuse, diabetes, pancreatitis suspected secondary to gallstones vs hypertriglyceridemia, gallbladder polyp s/p cholecystectomy, liver biopsy in July 2022 with path noting chronic hepatitis minimal activity (Grade 1) and bridging fibrosis (stage 3), returning to arrange colonoscopy.  Last colonoscopy Oct 2022 with tubular adneomas and 3 year surveillance recommended. Will schedule this for near future.  F3 fibrosis: with hx of Hep C, high risk for HCC and progression to cirrhosis in light of F3 status historically. ELF score 8.99, elastography last year with reduced accuracy. If evidence for cirrhosis in future, will need to determine timing of screening EGD for varices by reviewing high risk features including thrombocytopenia. Currently, platelets are 289. US  upcoming for HCC screening.     PLAN    US  abdomen complete Labs including AFP Proceed with colonoscopy by Dr. Cindie  in near future: the risks, benefits, and alternatives have been discussed with the patient  in detail. The patient states understanding and desires to proceed.  Hold Mounjaro one week prior, no iron 7 days prior, no insulin  morning of procedure 6 month follow-up or sooner if needed    Therisa MICAEL Stager, PhD, ANP-BC [facility]

## 2024-06-01 ENCOUNTER — Other Ambulatory Visit: Payer: Self-pay | Admitting: Gastroenterology

## 2024-06-01 DIAGNOSIS — K7400 Hepatic fibrosis, unspecified: Secondary | ICD-10-CM | POA: Diagnosis not present

## 2024-06-04 LAB — HEPATIC FUNCTION PANEL
AG Ratio: 1.3 (calc) (ref 1.0–2.5)
ALT: 16 U/L (ref 9–46)
AST: 17 U/L (ref 10–35)
Albumin: 4.3 g/dL (ref 3.6–5.1)
Alkaline phosphatase (APISO): 121 U/L (ref 35–144)
Bilirubin, Direct: 0.1 mg/dL (ref 0.0–0.2)
Globulin: 3.4 g/dL (ref 1.9–3.7)
Indirect Bilirubin: 0.3 mg/dL (ref 0.2–1.2)
Total Bilirubin: 0.4 mg/dL (ref 0.2–1.2)
Total Protein: 7.7 g/dL (ref 6.1–8.1)

## 2024-06-04 LAB — GAMMA GT: GGT: 28 U/L (ref 3–70)

## 2024-06-04 LAB — AFP TUMOR MARKER: AFP-Tumor Marker: 3.6 ng/mL (ref <6.1)

## 2024-06-07 ENCOUNTER — Ambulatory Visit: Payer: Self-pay | Admitting: Gastroenterology

## 2024-06-07 ENCOUNTER — Ambulatory Visit (HOSPITAL_COMMUNITY)
Admission: RE | Admit: 2024-06-07 | Discharge: 2024-06-07 | Disposition: A | Discharge status: Home / Self Care | Source: Ambulatory Visit | Attending: Gastroenterology | Admitting: Gastroenterology

## 2024-06-07 DIAGNOSIS — K7400 Hepatic fibrosis, unspecified: Secondary | ICD-10-CM | POA: Diagnosis present

## 2024-06-14 ENCOUNTER — Encounter (HOSPITAL_COMMUNITY)
Admission: RE | Admit: 2024-06-14 | Discharge: 2024-06-14 | Disposition: A | Discharge status: Home / Self Care | Source: Ambulatory Visit | Attending: Internal Medicine | Admitting: Internal Medicine

## 2024-06-14 ENCOUNTER — Encounter (HOSPITAL_COMMUNITY): Payer: Self-pay

## 2024-06-14 ENCOUNTER — Other Ambulatory Visit: Payer: Self-pay

## 2024-06-14 HISTORY — DX: Unspecified osteoarthritis, unspecified site: M19.90

## 2024-06-16 ENCOUNTER — Ambulatory Visit: Payer: Self-pay | Admitting: Gastroenterology

## 2024-06-18 ENCOUNTER — Encounter (HOSPITAL_COMMUNITY)
Admission: RE | Disposition: A | Discharge status: Home / Self Care | Payer: Self-pay | Source: Home / Self Care | Attending: Internal Medicine

## 2024-06-18 ENCOUNTER — Other Ambulatory Visit: Payer: Self-pay

## 2024-06-18 ENCOUNTER — Ambulatory Visit (HOSPITAL_COMMUNITY): Admitting: Anesthesiology

## 2024-06-18 ENCOUNTER — Encounter (HOSPITAL_COMMUNITY): Payer: Self-pay | Admitting: Internal Medicine

## 2024-06-18 ENCOUNTER — Ambulatory Visit (HOSPITAL_COMMUNITY)
Admission: RE | Admit: 2024-06-18 | Discharge: 2024-06-18 | Disposition: A | Discharge status: Home / Self Care | Attending: Internal Medicine | Admitting: Internal Medicine

## 2024-06-18 DIAGNOSIS — Z8719 Personal history of other diseases of the digestive system: Secondary | ICD-10-CM | POA: Diagnosis not present

## 2024-06-18 DIAGNOSIS — G4730 Sleep apnea, unspecified: Secondary | ICD-10-CM | POA: Insufficient documentation

## 2024-06-18 DIAGNOSIS — K648 Other hemorrhoids: Secondary | ICD-10-CM | POA: Diagnosis not present

## 2024-06-18 DIAGNOSIS — Z87891 Personal history of nicotine dependence: Secondary | ICD-10-CM | POA: Insufficient documentation

## 2024-06-18 DIAGNOSIS — K739 Chronic hepatitis, unspecified: Secondary | ICD-10-CM | POA: Diagnosis not present

## 2024-06-18 DIAGNOSIS — K219 Gastro-esophageal reflux disease without esophagitis: Secondary | ICD-10-CM | POA: Diagnosis not present

## 2024-06-18 DIAGNOSIS — K635 Polyp of colon: Secondary | ICD-10-CM | POA: Diagnosis not present

## 2024-06-18 DIAGNOSIS — I10 Essential (primary) hypertension: Secondary | ICD-10-CM

## 2024-06-18 DIAGNOSIS — Z860100 Personal history of colon polyps, unspecified: Secondary | ICD-10-CM

## 2024-06-18 DIAGNOSIS — E6689 Other obesity not elsewhere classified: Secondary | ICD-10-CM | POA: Insufficient documentation

## 2024-06-18 DIAGNOSIS — E782 Mixed hyperlipidemia: Secondary | ICD-10-CM | POA: Diagnosis not present

## 2024-06-18 DIAGNOSIS — E1122 Type 2 diabetes mellitus with diabetic chronic kidney disease: Secondary | ICD-10-CM | POA: Diagnosis not present

## 2024-06-18 DIAGNOSIS — D120 Benign neoplasm of cecum: Secondary | ICD-10-CM | POA: Diagnosis not present

## 2024-06-18 DIAGNOSIS — F1011 Alcohol abuse, in remission: Secondary | ICD-10-CM | POA: Diagnosis not present

## 2024-06-18 DIAGNOSIS — F1911 Other psychoactive substance abuse, in remission: Secondary | ICD-10-CM | POA: Insufficient documentation

## 2024-06-18 DIAGNOSIS — Z09 Encounter for follow-up examination after completed treatment for conditions other than malignant neoplasm: Secondary | ICD-10-CM | POA: Diagnosis present

## 2024-06-18 DIAGNOSIS — D175 Benign lipomatous neoplasm of intra-abdominal organs: Secondary | ICD-10-CM | POA: Diagnosis not present

## 2024-06-18 DIAGNOSIS — Z79899 Other long term (current) drug therapy: Secondary | ICD-10-CM | POA: Diagnosis not present

## 2024-06-18 DIAGNOSIS — Z9049 Acquired absence of other specified parts of digestive tract: Secondary | ICD-10-CM | POA: Diagnosis not present

## 2024-06-18 DIAGNOSIS — Z794 Long term (current) use of insulin: Secondary | ICD-10-CM | POA: Diagnosis not present

## 2024-06-18 DIAGNOSIS — Z1211 Encounter for screening for malignant neoplasm of colon: Secondary | ICD-10-CM

## 2024-06-18 DIAGNOSIS — I129 Hypertensive chronic kidney disease with stage 1 through stage 4 chronic kidney disease, or unspecified chronic kidney disease: Secondary | ICD-10-CM | POA: Insufficient documentation

## 2024-06-18 DIAGNOSIS — Z7985 Long-term (current) use of injectable non-insulin antidiabetic drugs: Secondary | ICD-10-CM | POA: Insufficient documentation

## 2024-06-18 DIAGNOSIS — Z6841 Body Mass Index (BMI) 40.0 and over, adult: Secondary | ICD-10-CM | POA: Insufficient documentation

## 2024-06-18 DIAGNOSIS — D123 Benign neoplasm of transverse colon: Secondary | ICD-10-CM | POA: Diagnosis not present

## 2024-06-18 DIAGNOSIS — N183 Chronic kidney disease, stage 3 unspecified: Secondary | ICD-10-CM | POA: Diagnosis not present

## 2024-06-18 DIAGNOSIS — J449 Chronic obstructive pulmonary disease, unspecified: Secondary | ICD-10-CM | POA: Diagnosis not present

## 2024-06-18 LAB — GLUCOSE, CAPILLARY: Glucose-Capillary: 97 mg/dL (ref 70–99)

## 2024-06-18 SURGERY — COLONOSCOPY
Anesthesia: General

## 2024-06-18 MED ORDER — LACTATED RINGERS IV SOLN: INTRAVENOUS | Status: DC

## 2024-06-18 MED ORDER — PROPOFOL 10 MG/ML IV BOLUS
INTRAVENOUS | Status: DC | PRN
  Administered 2024-06-18 (×2): 50 mg via INTRAVENOUS

## 2024-06-18 MED ORDER — PROPOFOL 500 MG/50ML IV EMUL
INTRAVENOUS | Status: DC | PRN
  Administered 2024-06-18: 150 ug/kg/min via INTRAVENOUS

## 2024-06-18 MED ORDER — LIDOCAINE 2% (20 MG/ML) 5 ML SYRINGE
INTRAMUSCULAR | Status: DC | PRN
  Administered 2024-06-18: 80 mg via INTRAVENOUS

## 2024-06-18 MED ORDER — LACTATED RINGERS IV SOLN: INTRAVENOUS | Status: DC | PRN

## 2024-06-18 NOTE — Op Note (Addendum)
 [facility] Patient Name: Mathew Lara Procedure Date: 06/18/2024 8:11 AM MRN: [id number] Date of Birth: [birth year] Attending MD: Carlin POUR. Cindie , [place], [id number] CSN: [id number] Age: 63 Admit Type: Outpatient Procedure:                Colonoscopy Indications:              High risk colon cancer surveillance: Personal                            history of colonic polyps Providers:                Carlin POUR. Cindie, DO, Harlene Lips, Kristine                            L. [employer]one Tech, Technician Referring MD:              Medicines:                See the Anesthesia note for documentation of the                            administered medications Complications:            No immediate complications. Estimated Blood Loss:     Estimated blood loss was minimal. Procedure:                Pre-Anesthesia Assessment:                           - The anesthesia plan was to use monitored                            anesthesia care (MAC).                           After obtaining informed consent, the colonoscope                            was passed under direct vision. Throughout the                            procedure, the patient's blood pressure, pulse, and                            oxygen  saturations were monitored continuously. The                            PCF-HQ190L ([id number]) Peds Colon was introduced                            through the anus and advanced to the the cecum,                            identified by appendiceal orifice and ileocecal                            valve. The colonoscopy was performed without  difficulty. The patient tolerated the procedure                            well. The quality of the bowel preparation was                            evaluated using the BBPS [facility] Bowel Preparation                            Scale) with scores of: Right Colon = 1 (portion of                            mucosa seen, but other  areas not well seen due to                            staining, residual stool and/or opaque liquid),                            Transverse Colon = 2 (minor amount of residual                            staining, small fragments of stool and/or opaque                            liquid, but mucosa seen well) and Left Colon = 1                            (portion of mucosa seen, but other areas not well                            seen due to staining, residual stool and/or opaque                            liquid). The total BBPS score equals 4. The quality                            of the bowel preparation was inadequate. Scope In: 8:30:51 AM Scope Out: 8:45:49 AM Scope Withdrawal Time: 0 hours 7 minutes 12 seconds  Total Procedure Duration: 0 hours 14 minutes 58 seconds  Findings:      Non-bleeding internal hemorrhoids were found.      A 4 mm polyp was found in the cecum. The polyp was sessile. The polyp       was removed with a cold snare. Resection and retrieval were complete.      A 5 mm polyp was found in the transverse colon. The polyp was sessile.       The polyp was removed with a cold snare. Resection and retrieval were       complete.      A large amount of semi-solid stool was found in the sigmoid colon, in       the ascending colon and in the cecum, precluding visualization. Lavage       of the area was performed using copious amounts of sterile water ,  resulting in incomplete clearance with continued poor visualization.      There was a medium-sized lipoma, in the ascending colon. Impression:               - Preparation of the colon was inadequate.                           - Non-bleeding internal hemorrhoids.                           - One 4 mm polyp in the cecum, removed with a cold                            snare. Resected and retrieved.                           - One 5 mm polyp in the transverse colon, removed                            with a cold snare.  Resected and retrieved.                           - Stool in the sigmoid colon, in the ascending                            colon and in the cecum.                           - Medium-sized lipoma in the ascending colon. Moderate Sedation:      Per Anesthesia Care Recommendation:           - Patient has a contact number available for                            emergencies. The signs and symptoms of potential                            delayed complications were discussed with the                            patient. Return to normal activities tomorrow.                            Written discharge instructions were provided to the                            patient.                           - Resume previous diet.                           - Continue present medications.                           - Await pathology results.                           -  Repeat colonoscopy in 3-6 months for poor prep.                           - Return to GI clinic in 8 weeks. Procedure Code(s):        --- Professional ---                           [phone], Colonoscopy, flexible; with removal of                            tumor(s), polyp(s), or other lesion(s) by snare                            technique Diagnosis Code(s):        --- Professional ---                           Z86.010, Personal history of colonic polyps                           K64.8, Other hemorrhoids                           D12.0, Benign neoplasm of cecum                           D12.3, Benign neoplasm of transverse colon (hepatic                            flexure or splenic flexure) CPT copyright 2022 American [employer] Association. All rights reserved. The codes documented in this report are preliminary and upon coder review may  be revised to meet current compliance requirements. Carlin POUR. Cindie, DO Carlin POUR. Abdoulaye Drum, DO 06/18/2024 8:52:40 AM This report has been signed electronically. Number of Addenda: 0

## 2024-06-18 NOTE — Transfer of Care (Signed)
 Immediate Anesthesia Transfer of Care Note  Patient: Mathew Lara  Procedure(s) Performed: COLONOSCOPY  Patient Location: PACU  Anesthesia Type:MAC  Level of Consciousness: awake and alert   Airway & Oxygen  Therapy: Patient Spontanous Breathing and Patient connected to face mask oxygen   Post-op Assessment: Report given to RN and Post -op Vital signs reviewed and stable  Post vital signs: Reviewed and stable  Last Vitals:  Vitals Value Taken Time  BP 114/81 06/18/24 08:52  Temp 36.6 C 06/18/24 08:52  Pulse 85 06/18/24 08:52  Resp 19 06/18/24 08:52  SpO2 97 % 06/18/24 08:52    Last Pain:  Vitals:   06/18/24 0852  TempSrc: Oral  PainSc: 0-No pain         Complications: No notable events documented.

## 2024-06-18 NOTE — Anesthesia Postprocedure Evaluation (Signed)
 Anesthesia Post Note  Patient: Mathew Lara  Procedure(s) Performed: COLONOSCOPY  Patient location during evaluation: Phase II Anesthesia Type: General Level of consciousness: awake Pain management: pain level controlled Vital Signs Assessment: post-procedure vital signs reviewed and stable Respiratory status: spontaneous breathing and respiratory function stable Cardiovascular status: blood pressure returned to baseline and stable Postop Assessment: no headache and no apparent nausea or vomiting Anesthetic complications: no Comments: Late entry   No notable events documented.   Last Vitals:  Vitals:   06/18/24 0739 06/18/24 0852  BP: (!) 142/96 114/81  Pulse: 80 85  Resp: 15 19  Temp: 36.9 C 36.6 C  SpO2: 97% 97%    Last Pain:  Vitals:   06/18/24 0852  TempSrc: Oral  PainSc: 0-No pain                 Yvonna JINNY Bosworth

## 2024-06-18 NOTE — Interval H&P Note (Signed)
 History and Physical Interval Note:  06/18/2024 8:16 AM  Mathew Lara  has presented today for surgery, with the diagnosis of history of polyps.  The various methods of treatment have been discussed with the patient and family. After consideration of risks, benefits and other options for treatment, the patient has consented to  Procedure(s) with comments: COLONOSCOPY (N/A) - 9:00 am, asa 3 as a surgical intervention.  The patient's history has been reviewed, patient examined, no change in status, stable for surgery.  I have reviewed the patient's chart and labs.  Questions were answered to the patient's satisfaction.     Mathew Lara

## 2024-06-18 NOTE — Discharge Instructions (Addendum)
  Colonoscopy Discharge Instructions  Read the instructions outlined below and refer to this sheet in the next few weeks. These discharge instructions provide you with general information on caring for yourself after you leave the hospital. Your doctor may also give you specific instructions. While your treatment has been planned according to the most current medical practices available, unavoidable complications occasionally occur.   ACTIVITY You may resume your regular activity, but move at a slower pace for the next 24 hours.  Take frequent rest periods for the next 24 hours.  Walking will help get rid of the air and reduce the bloated feeling in your belly (abdomen).  No driving for 24 hours (because of the medicine (anesthesia) used during the test).   Do not sign any important legal documents or operate any machinery for 24 hours (because of the anesthesia used during the test).  NUTRITION Drink plenty of fluids.  You may resume your normal diet as instructed by your doctor.  Begin with a light meal and progress to your normal diet. Heavy or fried foods are harder to digest and may make you feel sick to your stomach (nauseated).  Avoid alcoholic beverages for 24 hours or as instructed.  MEDICATIONS You may resume your normal medications unless your doctor tells you otherwise.  WHAT YOU CAN EXPECT TODAY Some feelings of bloating in the abdomen.  Passage of more gas than usual.  Spotting of blood in your stool or on the toilet paper.  IF YOU HAD POLYPS REMOVED DURING THE COLONOSCOPY: No aspirin  products for 7 days or as instructed.  No alcohol for 7 days or as instructed.  Eat a soft diet for the next 24 hours.  FINDING OUT THE RESULTS OF YOUR TEST Not all test results are available during your visit. If your test results are not back during the visit, make an appointment with your caregiver to find out the results. Do not assume everything is normal if you have not heard from your  caregiver or the medical facility. It is important for you to follow up on all of your test results.  SEEK IMMEDIATE MEDICAL ATTENTION IF: You have more than a spotting of blood in your stool.  Your belly is swollen (abdominal distention).  You are nauseated or vomiting.  You have a temperature over 101.  You have abdominal pain or discomfort that is severe or gets worse throughout the day.   Unfortunately, your colon was not adequately prepped today for colonoscopy.  I did not see any evidence of colon cancer or large polyps, but certainly could have missed smaller polyps due to poor visualization.  Would recommend repeat colonoscopy in 6 months with a different colon prep.  I did remove 2 small polyps. We will call with these results.   Follow up in GI office in 8 weeks.    I hope you have a great rest of your week!  Carlin POUR. Cindie, D.O. Gastroenterology and Hepatology [facility]

## 2024-06-18 NOTE — Anesthesia Preprocedure Evaluation (Addendum)
 Anesthesia Evaluation  Patient identified by MRN, date of birth, ID band Patient awake    Reviewed: Allergy & Precautions, H&P , NPO status , Patient's Chart, lab work & pertinent test results, reviewed documented beta blocker date and time   Airway Mallampati: II  TM Distance: >3 FB Neck ROM: full    Dental no notable dental hx.    Pulmonary sleep apnea , pneumonia, COPD, former smoker   Pulmonary exam normal breath sounds clear to auscultation       Cardiovascular Exercise Tolerance: Good hypertension,  Rhythm:regular Rate:Normal     Neuro/Psych  PSYCHIATRIC DISORDERS Anxiety     negative neurological ROS  negative psych ROS   GI/Hepatic negative GI ROS, Neg liver ROS,GERD  ,,(+) Hepatitis -  Endo/Other  diabetes  Class 4 obesity  Renal/GU Renal diseasenegative Renal ROS  negative genitourinary   Musculoskeletal   Abdominal   Peds  Hematology negative hematology ROS (+) Blood dyscrasia, anemia   Anesthesia Other Findings   Reproductive/Obstetrics negative OB ROS                              Anesthesia Physical Anesthesia Plan  ASA: 3  Anesthesia Plan: General   Post-op Pain Management:    Induction:   PONV Risk Score and Plan: Propofol  infusion  Airway Management Planned:   Additional Equipment:   Intra-op Plan:   Post-operative Plan:   Informed Consent: I have reviewed the patients History and Physical, chart, labs and discussed the procedure including the risks, benefits and alternatives for the proposed anesthesia with the patient or authorized representative who has indicated his/her understanding and acceptance.     Dental Advisory Given  Plan Discussed with: CRNA  Anesthesia Plan Comments:          Anesthesia Quick Evaluation

## 2024-06-19 ENCOUNTER — Other Ambulatory Visit: Payer: Self-pay | Admitting: *Deleted

## 2024-06-19 DIAGNOSIS — K863 Pseudocyst of pancreas: Secondary | ICD-10-CM

## 2024-06-19 LAB — SURGICAL PATHOLOGY

## 2024-06-20 ENCOUNTER — Encounter (HOSPITAL_COMMUNITY): Payer: Self-pay | Admitting: Internal Medicine

## 2024-06-28 ENCOUNTER — Ambulatory Visit (HOSPITAL_COMMUNITY)
Admission: RE | Admit: 2024-06-28 | Discharge: 2024-06-28 | Disposition: A | Discharge status: Home / Self Care | Source: Ambulatory Visit | Attending: Gastroenterology | Admitting: Gastroenterology

## 2024-06-28 DIAGNOSIS — K863 Pseudocyst of pancreas: Secondary | ICD-10-CM | POA: Diagnosis present

## 2024-07-06 ENCOUNTER — Ambulatory Visit: Payer: Self-pay | Admitting: Gastroenterology

## 2024-07-15 ENCOUNTER — Ambulatory Visit: Payer: Self-pay | Admitting: Internal Medicine

## 2024-07-16 DIAGNOSIS — N189 Chronic kidney disease, unspecified: Secondary | ICD-10-CM | POA: Diagnosis not present

## 2024-07-16 DIAGNOSIS — E1122 Type 2 diabetes mellitus with diabetic chronic kidney disease: Secondary | ICD-10-CM | POA: Diagnosis not present

## 2024-07-16 DIAGNOSIS — E875 Hyperkalemia: Secondary | ICD-10-CM | POA: Diagnosis not present

## 2024-07-16 DIAGNOSIS — N184 Chronic kidney disease, stage 4 (severe): Secondary | ICD-10-CM | POA: Diagnosis not present

## 2024-07-16 DIAGNOSIS — E1129 Type 2 diabetes mellitus with other diabetic kidney complication: Secondary | ICD-10-CM | POA: Diagnosis not present

## 2024-07-16 DIAGNOSIS — Z23 Encounter for immunization: Secondary | ICD-10-CM | POA: Diagnosis not present

## 2024-07-16 DIAGNOSIS — R809 Proteinuria, unspecified: Secondary | ICD-10-CM | POA: Diagnosis not present

## 2024-07-16 DIAGNOSIS — Z9889 Other specified postprocedural states: Secondary | ICD-10-CM | POA: Diagnosis not present

## 2024-07-16 DIAGNOSIS — I129 Hypertensive chronic kidney disease with stage 1 through stage 4 chronic kidney disease, or unspecified chronic kidney disease: Secondary | ICD-10-CM | POA: Diagnosis not present

## 2024-07-16 DIAGNOSIS — N2581 Secondary hyperparathyroidism of renal origin: Secondary | ICD-10-CM | POA: Diagnosis not present

## 2024-07-16 DIAGNOSIS — D631 Anemia in chronic kidney disease: Secondary | ICD-10-CM | POA: Diagnosis not present

## 2024-07-23 ENCOUNTER — Telehealth: Payer: Self-pay | Admitting: Gastroenterology

## 2024-07-23 NOTE — Telephone Encounter (Signed)
 Patient called and asked to pass a message along to Dena to call him.  He didn't say what it was about. [phone]

## 2024-07-27 NOTE — Telephone Encounter (Signed)
 Returned the pt's call and he was under the impression that he had a MRI and not a CT. Went over the pt's CT report with him to make it a little more simple for him to understand. Pt expressed understanding

## 2024-07-30 DIAGNOSIS — G8929 Other chronic pain: Secondary | ICD-10-CM | POA: Diagnosis not present

## 2024-08-14 ENCOUNTER — Ambulatory Visit: Admitting: Gastroenterology

## 2024-10-01 ENCOUNTER — Encounter: Payer: Self-pay | Admitting: *Deleted

## 2024-10-03 ENCOUNTER — Telehealth (INDEPENDENT_AMBULATORY_CARE_PROVIDER_SITE_OTHER): Payer: Self-pay

## 2024-10-03 ENCOUNTER — Encounter: Payer: Self-pay | Admitting: Gastroenterology

## 2024-10-03 ENCOUNTER — Ambulatory Visit: Admitting: Gastroenterology

## 2024-10-03 VITALS — BP 110/82 | HR 91 | Temp 97.7°F | Ht 68.0 in | Wt 266.2 lb

## 2024-10-03 DIAGNOSIS — K7400 Hepatic fibrosis, unspecified: Secondary | ICD-10-CM

## 2024-10-03 DIAGNOSIS — Z860100 Personal history of colon polyps, unspecified: Secondary | ICD-10-CM

## 2024-10-03 DIAGNOSIS — K869 Disease of pancreas, unspecified: Secondary | ICD-10-CM

## 2024-10-03 NOTE — Progress Notes (Unsigned)
 "   Gastroenterology Office Note     Primary Care Physician:  Marvine Rush, MD  Primary Gastroenterologist:   Chief Complaint   Chief Complaint  Patient presents with   Follow-up    Patient here today for a follow up on Hepatic fibrosis, history of colon polyps. Had a recent TCS preformed by Dr. Cindie on 06/18/2024. Patient denies any current gi related issues.      History of Present Illness   Mathew Lara is a 64 y.o. male presenting today with a history of hepatitis C, genotype 1b treated with [name] 2016 with SVR, anxiety, COPD, CKD stage III, HTN, HLD, dialysis in the past but now none for 5-6 months, remote history of alcohol and polysubstance abuse, diabetes, pancreatitis suspected secondary to gallstones vs hypertriglyceridemia, gallbladder polyp s/p cholecystectomy, liver biopsy in July 2022 with path noting chronic hepatitis minimal activity (Grade 1) and bridging fibrosis (stage 3)     Colonoscopy: small amount prep left. BM every day. Empties well. Takes miralax  OTC as needed. Feels this is better.   304 at highest, now 266.   He wants to hold off on repeat colonoscopy right now. Would like to do MRI/MRCP.   Fibrosis: ELF score 8.99, FIB 4 indeterminate risk, elastography Sept 2024 with IQR ration 0.4, so reduced accuracy. Following with serial ultrasounds.   US  abdomen 06/2024 Cirrhotic liver morphology. No focal lesion identified. 2. There is a 5.2 cm cystic mass in the body of the pancreas, previously 4.5 cm on prior ultrasound. Differences in measurement may be due to differences in scan plane technique. This was previously felt to reflect a pseudocyst from sequela of prior pancreatitis in 2023. Portions of this mass are suboptimally assessed secondary to shadowing bowel gas. Recommend consideration of definitive characterization with pancreatic protocol CT or MRI with and without contrast.   CT without contrast 06/2024  Slightly diminished size of a  lesion arising from the ventral pancreatic body measuring 3.7 x 3.4 cm, previously 4.4 x 3.8 cm (series 2, image 30). Although primarily of fluid attenuation, this does have some nodular appearing soft tissue attenuation of the anterior aspect. No pancreatic ductal dilatation or surrounding inflammatory changes.      OCt 2025 colonoscopy Preparation of the colon was inadequate.                           - Non-bleeding internal hemorrhoids.                           - One 4 mm polyp in the cecum, removed with a cold                            snare. Resected and retrieved.                           - One 5 mm polyp in the transverse colon, removed                            with a cold snare. Resected and retrieved.                           - Stool in the sigmoid colon, in the ascending  colon and in the cecum.                           - Medium-sized lipoma in the ascending colon. Needs repeat colonoscopy in 3-6 months due to poor prep. Tubular adenoma  Colonoscopy Oct 2022 with non-bleeding internal hemorrhoids, tubular adenomas, 3 year surveillance recommended.   Past Medical History:  Diagnosis Date   Alcohol abuse    Anxiety    Arthritis    Bronchitis    Chronic hip pain    CKD (chronic kidney disease) stage 3, GFR 30-59 ml/min (HCC)    COPD (chronic obstructive pulmonary disease) (HCC)    DKA (diabetic ketoacidosis) (HCC) 04/10/2022   Essential hypertension    Hepatitis C    Mixed hyperlipidemia    Sleep apnea     Past Surgical History:  Procedure Laterality Date   BALLOON DILATION N/A 07/06/2021   Procedure: BALLOON DILATION;  Surgeon: Cindie Carlin POUR, DO;  Location: [facility];  Service: Endoscopy;  Laterality: N/A;   BIOPSY  07/06/2021   Procedure: BIOPSY;  Surgeon: Cindie Carlin POUR, DO;  Location: [facility];  Service: Endoscopy;;   CATARACT EXTRACTION Right    CHOLECYSTECTOMY N/A 03/23/2021   Procedure: LAPAROSCOPIC  CHOLECYSTECTOMY;  Surgeon: Kallie Manuelita BROCKS, MD;  Location: [facility];  Service: General;  Laterality: N/A;   COLONOSCOPY N/A 01/23/2014   SLF:The LEFT COLON IS EXTREMELY redundant/TWO RECTAL POLYPS REMOVED/SMALL VOLUME RECTAL BLEEDING MOST LIKELY DUE TO Small internal hemorrhoids. path with prolapse type polyp of benign-mucosa. no adenomas   COLONOSCOPY N/A 06/18/2024   Procedure: COLONOSCOPY;  Surgeon: Cindie Carlin POUR, DO;  [facility]: [facility];  Service: Endoscopy;  Laterality: N/A;  9:00 am, asa 3   COLONOSCOPY WITH PROPOFOL  N/A 07/06/2021   non-bleeding internal hemorrhoids, one 8 mm polyp in sigmod s/p removal and clip placed. One 5 mm polyp at recto-sigmoid s/p removal. 3 year surveillance. Tubular adenomas   ESOPHAGOGASTRODUODENOSCOPY N/A 01/23/2014   DOQ:Dryjusxp ring at the gastroesophageal junction/Medium sized hiatal hernia/NDYSPEPSIA MOST LIKELY DUE TO GERD/MILD Non-erosive gastritis   ESOPHAGOGASTRODUODENOSCOPY N/A 04/18/2018   food impaction, small hiatal hernia   ESOPHAGOGASTRODUODENOSCOPY (EGD) WITH PROPOFOL  N/A 07/06/2021   moderate Schatzki ring s/p dilation, gastritis s/p biopsy, normal duodenum. Negative H.pylori.   HEMORRHOID BANDING  01/23/2014   Procedure: HEMORRHOID BANDING;  Surgeon: Margo LITTIE Haddock, MD;  [facility]: [facility];  Service: Endoscopy;;   IR FLUORO GUIDE CV LINE LEFT  05/04/2022   IR FLUORO GUIDE CV LINE LEFT  05/13/2022   IR FLUORO GUIDE CV LINE LEFT  06/11/2022   IR US  GUIDE VASC ACCESS LEFT  05/04/2022   JOINT REPLACEMENT Right 2017   LIVER BIOPSY N/A 03/23/2021   Procedure: LAPAROSCOPIC LIVER BIOPSY;  Surgeon: Kallie Manuelita BROCKS, MD;  Location: [facility];  Service: General;  Laterality: N/A;   MOUTH SURGERY     POLYPECTOMY  07/06/2021   Procedure: POLYPECTOMY;  Surgeon: Cindie Carlin POUR, DO;  [facility]: [facility];  Service: Endoscopy;;   TOTAL HIP ARTHROPLASTY Right 06/04/2016   Procedure: RIGHT TOTAL HIP ARTHROPLASTY ANTERIOR APPROACH;  Surgeon:  Lonni CINDERELLA Poli, MD;  Location: [facility];  Service: Orthopedics;  Laterality: Right;   TOTAL HIP ARTHROPLASTY Left 03/02/2019   Procedure: LEFT TOTAL HIP ARTHROPLASTY ANTERIOR APPROACH;  Surgeon: Poli Lonni CINDERELLA, MD;  Location: [facility];  Service: Orthopedics;  Laterality: Left;    Current Outpatient Medications  Medication Sig Dispense  Refill   acetaminophen  (TYLENOL ) 325 MG tablet Take 2 tablets (650 mg total) by mouth every 6 (six) hours as needed for mild pain (or Fever >/= 101). 120 tablet 0   albuterol  (VENTOLIN  HFA) 108 (90 Base) MCG/ACT inhaler Inhale 2 puffs into the lungs every 6 (six) hours as needed for wheezing or shortness of breath. 8 g 6   amLODipine  (NORVASC ) 10 MG tablet Take 10 mg by mouth daily.     aspirin  EC 81 MG tablet Take 1 tablet (81 mg total) by mouth daily with breakfast. 30 tablet 11   atorvastatin  (LIPITOR ) 80 MG tablet Take 1 tablet (80 mg total) by mouth daily. (Patient taking differently: Take 80 mg by mouth at bedtime.)     B Complex-C-Zn-Folic Acid  (DIALYVITE 800 WITH ZINC ) 0.8 MG TABS Take 1 tablet by mouth daily.     ferrous fumarate  (FERRETTS) 325 (106 Fe) MG TABS tablet Take 1 tablet by mouth daily.     HYDROcodone -acetaminophen  (NORCO/VICODIN) 5-325 MG tablet Take 1 tablet by mouth 3 (three) times daily as needed.     insulin  aspart (NOVOLOG ) 100 UNIT/ML injection Inject 1-3 Units into the skin 3 (three) times daily with meals. (Patient taking differently: Inject 2-12 Units into the skin See admin instructions. Inject per sliding scale: If 201-250=2 units 251-300=4 units 301-350=6 units 351-400=8 units 401-450=10 units 451-500=12 units If reads high, give 14 units, recheck in 2 hours. Call MD if blood sugar is >400 or <10) 10 mL 11   insulin  detemir (LEVEMIR ) 100 UNIT/ML injection Inject 0.08 mLs (8 Units total) into the skin 2 (two) times daily. (Patient taking differently: Inject 10 Units into the skin daily.) 10 mL 11   KERENDIA 10 MG  TABS Take 1 tablet by mouth daily.     loratadine  (CLARITIN ) 10 MG tablet Take 1 tablet (10 mg total) by mouth daily. 30 tablet 1   MOUNJARO 7.5 MG/0.5ML Pen Inject 7.5 mg into the skin once a week.     Multiple Vitamin (MULTIVITAMIN) tablet Take 1 tablet by mouth daily.     oxyCODONE -acetaminophen  (PERCOCET) 7.5-325 MG tablet Take 1 tablet by mouth 3 (three) times daily.     pantoprazole  (PROTONIX ) 40 MG tablet TAKE 1 TABLET BY MOUTH ONCE DAILY 30 MINUTES BEFORE BREAKFAST 90 tablet 3   Vitamin D , Ergocalciferol , (DRISDOL) 1.25 MG (50000 UNIT) CAPS capsule Take 50,000 Units by mouth once a week.     zolpidem  (AMBIEN ) 5 MG tablet Take 1 tablet (5 mg total) by mouth at bedtime as needed for sleep. 10 tablet 0   No current facility-administered medications for this visit.    Allergies as of 10/03/2024   (No Known Allergies)    Family History  Problem Relation Age of Onset   Cancer Mother    Colon cancer Neg Hx    Colon polyps Neg Hx     Social History   Socioeconomic History   Marital status: Single    Spouse name: Not on file   Number of children: 1   Years of education: 10   Highest education level: Not on file  Occupational History    Comment: [employer]  Tobacco Use   Smoking status: Former    Current packs/day: 0.00    Average packs/day: 1 pack/day for 42.7 years (42.7 ttl pk-yrs)    Types: Cigarettes    Start date: 09/07/1971    Quit date: 05/28/2014    Years since quitting: 10.3    Passive exposure: Past  Smokeless tobacco: Never   Tobacco comments:    quit in November 2015 after smoking x 20 yrs.  Vaping Use   Vaping status: Never Used  Substance and Sexual Activity   Alcohol use: Not Currently    Alcohol/week: 0.0 standard drinks of alcohol   Drug use: No   Sexual activity: Not on file  Other Topics Concern   Not on file  Social History Narrative       patient consumes 4 cups of caffeine daily.      Patient stopped drinking years ago.   Social Drivers  of Health   Tobacco Use: Medium Risk (10/03/2024)   Patient History    Smoking Tobacco Use: Former    Smokeless Tobacco Use: Never    Passive Exposure: Past  Physicist, Medical Strain: Low Risk (04/11/2023)   Overall Financial Resource Strain (CARDIA)    Difficulty of Paying Living Expenses: Not very hard  Food Insecurity: No Food Insecurity (04/11/2023)   Hunger Vital Sign    Worried About Running Out of Food in the Last Year: Never true    Ran Out of Food in the Last Year: Never true  Transportation Needs: No Transportation Needs (04/11/2023)   PRAPARE - Administrator, Civil Service (Medical): No    Lack of Transportation (Non-Medical): No  Physical Activity: Sufficiently Active (04/11/2023)   Exercise Vital Sign    Days of Exercise per Week: 7 days    Minutes of Exercise per Session: 40 min  Stress: Not on file  Social Connections: Not on file  Intimate Partner Violence: Unknown (05/28/2022)   Humiliation, Afraid, Rape, and Kick questionnaire    Fear of Current or Ex-Partner: No    Emotionally Abused: No    Physically Abused: No    Sexually Abused: Not on file  Depression (EYV7-0): Not on file  Alcohol Screen: Not on file  Housing: Low Risk (04/11/2023)   Housing    Last Housing Risk Score: 0  Utilities: Not At Risk (04/11/2023)   AHC Utilities    Threatened with loss of utilities: No  Health Literacy: Not on file     Review of Systems   Gen: Denies any fever, chills, fatigue, weight loss, lack of appetite.  CV: Denies chest pain, heart palpitations, peripheral edema, syncope.  Resp: Denies shortness of breath at rest or with exertion. Denies wheezing or cough.  GI: Denies dysphagia or odynophagia. Denies jaundice, hematemesis, fecal incontinence. GU : Denies urinary burning, urinary frequency, urinary hesitancy MS: Denies joint pain, muscle weakness, cramps, or limitation of movement.  Derm: Denies rash, itching, dry skin Psych: Denies depression, anxiety, memory  loss, and confusion Heme: Denies bruising, bleeding, and enlarged lymph nodes.   Physical Exam   BP 110/82 (BP Location: Left Arm, Patient Position: Sitting, Cuff Size: Large)   Pulse 91   Temp 97.7 F (36.5 C) (Oral)   Ht 5' 8 (1.727 m)   Wt 266 lb 3.2 oz (120.7 kg)   BMI 40.48 kg/m  General:   Alert and oriented. Pleasant and cooperative. Well-nourished and well-developed.  Head:  Normocephalic and atraumatic. Eyes:  Without icterus Abdomen:  +BS, soft, non-tender and non-distended. No HSM noted. No guarding or rebound. No masses appreciated.  Rectal:  Deferred  Msk:  Symmetrical without gross deformities. Normal posture. Extremities:  Without edema. Neurologic:  Alert and  oriented x4;  grossly normal neurologically. Skin:  Intact without significant lesions or rashes. Psych:  Alert and cooperative. Normal mood and  affect.   Assessment   Mathew Lara is a 64 y.o. male presenting today with a history of    AWB,  I think EUS is not unreasonable, but my availability for this will be most likely March, due to changes in my schedule in February.  I think you can do MRI with contrast as there is less concern these days with MRI contrast, if you discuss with Radiology.  So MRI/MRCP is reasonable to consider in a time span of 4-weeks and if continuing to decrease in size and no enhancing solid component, then you may be able to lengthen imaging follow-up.  If EUS is wanted, I will not be able to accommodate this until mid March or April.  Let me know which way you go.  GM    PLAN   *****    Therisa MICAEL Stager, PhD, ANP-BC [facility]    "

## 2024-10-03 NOTE — Telephone Encounter (Signed)
 Spoke with patient, scheduled MRI/MRCP for 10/10/2024 at 3:30pm- NPO 4 hours, arrive at 3pm. Patient aware and verbalized understanding.

## 2024-10-03 NOTE — Patient Instructions (Addendum)
 Please have blood work done at [facility].   We are arranging an MRI in the near future: we may need to do this without contrast. I am talking with radiology about this.  I will see you back in 3-4 months! Further recommendations after we review labs and imaging!  HAPPY LATE BIRTHDAY!!!   I enjoyed seeing you again today! I value our relationship and want to provide genuine, compassionate, and quality care. You may receive a survey regarding your visit with me, and I welcome your feedback! Thanks so much for taking the time to complete this. I look forward to seeing you again.      Therisa MICAEL Stager, PhD, ANP-BC [facility]

## 2024-10-06 LAB — COMPREHENSIVE METABOLIC PANEL WITH GFR
AG Ratio: 1 (calc) (ref 1.0–2.5)
ALT: 24 U/L (ref 9–46)
AST: 18 U/L (ref 10–35)
Albumin: 4.3 g/dL (ref 3.6–5.1)
Alkaline phosphatase (APISO): 168 U/L — ABNORMAL HIGH (ref 35–144)
BUN/Creatinine Ratio: 9 (calc) (ref 6–22)
BUN: 35 mg/dL — ABNORMAL HIGH (ref 7–25)
CO2: 21 mmol/L (ref 20–32)
Calcium: 9.8 mg/dL (ref 8.6–10.3)
Chloride: 109 mmol/L (ref 98–110)
Creat: 3.93 mg/dL — ABNORMAL HIGH (ref 0.70–1.35)
Globulin: 4.3 g/dL — ABNORMAL HIGH (ref 1.9–3.7)
Glucose, Bld: 93 mg/dL (ref 65–99)
Potassium: 4.7 mmol/L (ref 3.5–5.3)
Sodium: 139 mmol/L (ref 135–146)
Total Bilirubin: 0.5 mg/dL (ref 0.2–1.2)
Total Protein: 8.6 g/dL — ABNORMAL HIGH (ref 6.1–8.1)
eGFR: 16 mL/min/{1.73_m2} — ABNORMAL LOW

## 2024-10-06 LAB — CBC WITH DIFFERENTIAL/PLATELET
Absolute Lymphocytes: 1076 {cells}/uL (ref 850–3900)
Absolute Monocytes: 644 {cells}/uL (ref 200–950)
Basophils Absolute: 37 {cells}/uL (ref 0–200)
Basophils Relative: 0.4 %
Eosinophils Absolute: 460 {cells}/uL (ref 15–500)
Eosinophils Relative: 5 %
HCT: 37.8 % — ABNORMAL LOW (ref 39.4–51.1)
Hemoglobin: 12.4 g/dL — ABNORMAL LOW (ref 13.2–17.1)
MCH: 28.1 pg (ref 27.0–33.0)
MCHC: 32.8 g/dL (ref 31.6–35.4)
MCV: 85.5 fL (ref 81.4–101.7)
MPV: 10.2 fL (ref 7.5–12.5)
Monocytes Relative: 7 %
Neutro Abs: 6983 {cells}/uL (ref 1500–7800)
Neutrophils Relative %: 75.9 %
Platelets: 278 10*3/uL (ref 140–400)
RBC: 4.42 Million/uL (ref 4.20–5.80)
RDW: 15.7 % — ABNORMAL HIGH (ref 11.0–15.0)
Total Lymphocyte: 11.7 %
WBC: 9.2 10*3/uL (ref 3.8–10.8)

## 2024-10-06 LAB — PROTIME-INR
INR: 1
Prothrombin Time: 10.9 s (ref 9.0–11.5)

## 2024-10-06 LAB — ENHANCED LIVER FIBROSIS (ELF): ENHANCED LIVER FIBROSIS (ELF) SCORE: 8.52

## 2024-10-06 LAB — AFP TUMOR MARKER: AFP-Tumor Marker: 3 ng/mL

## 2024-10-10 ENCOUNTER — Other Ambulatory Visit: Payer: Self-pay | Admitting: Gastroenterology

## 2024-10-10 ENCOUNTER — Ambulatory Visit (HOSPITAL_COMMUNITY)
Admission: RE | Admit: 2024-10-10 | Discharge: 2024-10-10 | Payer: Self-pay | Attending: Gastroenterology | Admitting: Gastroenterology

## 2024-10-10 DIAGNOSIS — K869 Disease of pancreas, unspecified: Secondary | ICD-10-CM

## 2024-10-10 DIAGNOSIS — K7400 Hepatic fibrosis, unspecified: Secondary | ICD-10-CM

## 2024-10-10 DIAGNOSIS — Z860100 Personal history of colon polyps, unspecified: Secondary | ICD-10-CM
